# Patient Record
Sex: Male | Born: 1940
Health system: Southern US, Community
[De-identification: ages and names within clinical notes are randomized; demographics above are authoritative.]

## PROBLEM LIST (undated history)

## (undated) DIAGNOSIS — I739 Peripheral vascular disease, unspecified: Secondary | ICD-10-CM

## (undated) DIAGNOSIS — E119 Type 2 diabetes mellitus without complications: Secondary | ICD-10-CM

## (undated) DIAGNOSIS — E559 Vitamin D deficiency, unspecified: Secondary | ICD-10-CM

## (undated) DIAGNOSIS — C61 Malignant neoplasm of prostate: Secondary | ICD-10-CM

## (undated) DIAGNOSIS — Z923 Personal history of irradiation: Secondary | ICD-10-CM

## (undated) DIAGNOSIS — I482 Chronic atrial fibrillation, unspecified: Secondary | ICD-10-CM

## (undated) DIAGNOSIS — Z95810 Presence of automatic (implantable) cardiac defibrillator: Secondary | ICD-10-CM

## (undated) DIAGNOSIS — E785 Hyperlipidemia, unspecified: Secondary | ICD-10-CM

## (undated) DIAGNOSIS — I509 Heart failure, unspecified: Secondary | ICD-10-CM

## (undated) DIAGNOSIS — C801 Malignant (primary) neoplasm, unspecified: Secondary | ICD-10-CM

## (undated) DIAGNOSIS — Z8619 Personal history of other infectious and parasitic diseases: Secondary | ICD-10-CM

## (undated) DIAGNOSIS — I10 Essential (primary) hypertension: Secondary | ICD-10-CM

## (undated) HISTORY — DX: Heart failure, unspecified: I50.9

## (undated) HISTORY — DX: Personal history of other infectious and parasitic diseases: Z86.19

## (undated) HISTORY — PX: PROSTATE BIOPSY: SHX241

## (undated) HISTORY — PX: KNEE SURGERY: SHX244

## (undated) HISTORY — DX: Type 2 diabetes mellitus without complications: E11.9

## (undated) HISTORY — DX: Chronic atrial fibrillation, unspecified: I48.20

## (undated) HISTORY — DX: Personal history of irradiation: Z92.3

## (undated) HISTORY — DX: Essential (primary) hypertension: I10

## (undated) HISTORY — DX: Hyperlipidemia, unspecified: E78.5

---
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
## 2004-10-23 ENCOUNTER — Ambulatory Visit: Payer: Self-pay | Admitting: Cardiology

## 2004-12-01 ENCOUNTER — Ambulatory Visit (HOSPITAL_COMMUNITY): Admission: RE | Admit: 2004-12-01 | Discharge: 2004-12-01 | Payer: Self-pay | Admitting: Family Medicine

## 2005-11-12 ENCOUNTER — Ambulatory Visit: Payer: Self-pay | Admitting: Cardiology

## 2006-11-11 ENCOUNTER — Ambulatory Visit: Payer: Self-pay | Admitting: Cardiology

## 2007-12-09 ENCOUNTER — Ambulatory Visit: Payer: Self-pay | Admitting: Cardiology

## 2009-01-09 ENCOUNTER — Ambulatory Visit: Payer: Self-pay | Admitting: Cardiology

## 2009-01-21 ENCOUNTER — Encounter: Payer: Self-pay | Admitting: Cardiology

## 2009-01-21 LAB — CONVERTED CEMR LAB
Albumin: 4.5 g/dL
Alkaline Phosphatase: 67 units/L
CO2: 27 meq/L
Cholesterol: 239 mg/dL
Creatinine, Ser: 1.09 mg/dL
Glucose, Bld: 119 mg/dL
LDL (calc): 152 mg/dL
LDL Cholesterol: 152 mg/dL
MCV: 96.2 fL
Triglyceride fasting, serum: 110 mg/dL
WBC: 7.9 10*3/uL

## 2009-03-08 ENCOUNTER — Encounter (INDEPENDENT_AMBULATORY_CARE_PROVIDER_SITE_OTHER): Payer: Self-pay | Admitting: *Deleted

## 2009-03-18 DIAGNOSIS — E119 Type 2 diabetes mellitus without complications: Secondary | ICD-10-CM | POA: Insufficient documentation

## 2009-03-18 DIAGNOSIS — I4891 Unspecified atrial fibrillation: Secondary | ICD-10-CM

## 2009-03-18 DIAGNOSIS — E785 Hyperlipidemia, unspecified: Secondary | ICD-10-CM

## 2009-12-10 ENCOUNTER — Encounter: Payer: Self-pay | Admitting: Cardiology

## 2010-01-05 ENCOUNTER — Encounter: Payer: Self-pay | Admitting: Cardiology

## 2010-01-05 DIAGNOSIS — M109 Gout, unspecified: Secondary | ICD-10-CM

## 2010-01-06 ENCOUNTER — Ambulatory Visit: Payer: Self-pay | Admitting: Cardiology

## 2010-01-07 ENCOUNTER — Encounter (INDEPENDENT_AMBULATORY_CARE_PROVIDER_SITE_OTHER): Payer: Self-pay | Admitting: *Deleted

## 2010-01-07 ENCOUNTER — Encounter: Payer: Self-pay | Admitting: Cardiology

## 2010-01-07 LAB — CONVERTED CEMR LAB
ALT: 15 units/L (ref 0–53)
AST: 19 units/L
AST: 19 units/L (ref 0–37)
Albumin: 4.5 g/dL
Albumin: 4.5 g/dL (ref 3.5–5.2)
Alkaline Phosphatase: 68 units/L (ref 39–117)
BUN: 26 mg/dL
Basophils Absolute: 0 10*3/uL (ref 0.0–0.1)
Basophils Relative: 1 % (ref 0–1)
Calcium: 10.4 mg/dL
Cholesterol: 180 mg/dL
Creatinine, Ser: 1.11 mg/dL
Eosinophils Relative: 3 % (ref 0–5)
Glucose, Bld: 121 mg/dL
Glucose, Bld: 121 mg/dL — ABNORMAL HIGH (ref 70–99)
HCT: 46.7 %
HCT: 46.7 % (ref 39.0–52.0)
Hemoglobin: 14.9 g/dL
LDL Cholesterol: 121 mg/dL — ABNORMAL HIGH (ref 0–99)
MCV: 100 fL
Neutro Abs: 4.3 10*3/uL (ref 1.7–7.7)
Neutrophils Relative %: 58 % (ref 43–77)
Platelets: 248 10*3/uL
RDW: 14.7 % (ref 11.5–15.5)
Sodium: 140 meq/L (ref 135–145)
Total Protein: 7.1 g/dL
WBC: 7.4 10*3/uL
WBC: 7.4 10*3/uL (ref 4.0–10.5)

## 2010-01-08 ENCOUNTER — Encounter (INDEPENDENT_AMBULATORY_CARE_PROVIDER_SITE_OTHER): Payer: Self-pay | Admitting: *Deleted

## 2010-01-09 ENCOUNTER — Encounter (INDEPENDENT_AMBULATORY_CARE_PROVIDER_SITE_OTHER): Payer: Self-pay | Admitting: *Deleted

## 2010-10-29 NOTE — Miscellaneous (Signed)
Summary: catopril and metoprolol refill  Clinical Lists Changes  Medications: Added new medication of CAPTOPRIL 100 MG TABS (CAPTOPRIL) Take one tablet by mouth once daily - Signed Added new medication of METOPROLOL TARTRATE 100 MG TABS (METOPROLOL TARTRATE) Take 1/2  tablet by mouth twice a day - Signed Rx of CAPTOPRIL 100 MG TABS (CAPTOPRIL) Take one tablet by mouth once daily;  #90 x 3;  Signed;  Entered by: Teressa Lower RN;  Authorized by: Kathlen Brunswick, MD, [facility];  Method used: Electronically to [facility] [address]*, [address], [place], [place], [place]  [address], Ph: [phone], Fax: [phone] Rx of METOPROLOL TARTRATE 100 MG TABS (METOPROLOL TARTRATE) Take 1/2  tablet by mouth twice a day;  #90 x 3;  Signed;  Entered by: Teressa Lower RN;  Authorized by: Kathlen Brunswick, MD, [facility];  Method used: Electronically to [facility] [address] [place], [place], [place]  [address], Ph: [phone], Fax: [phone]    Prescriptions: METOPROLOL TARTRATE 100 MG TABS (METOPROLOL TARTRATE) Take 1/2  tablet by mouth twice a day  #90 x 3   Entered by:   Teressa Lower RN   Authorized by:   Kathlen Brunswick, MD, [facility]   Signed by:   Teressa Lower RN on 12/10/2009   Method used:   Electronically to        [facility]  [address] (retail)       [address]       [place], [place]  [address]       Ph: [phone]       Fax: [phone]   RxID:   [id number] CAPTOPRIL 100 MG TABS (CAPTOPRIL) Take one tablet by mouth once daily  #90 x 3   Entered by:   Teressa Lower RN   Authorized by:   Kathlen Brunswick, MD, [facility]   Signed by:   Teressa Lower RN on 12/10/2009   Method used:   Electronically to        [facility]  [address] (retail)       [address]       [place], [place]  [address]       Ph: [phone]       Fax: [phone]   RxID:   [id number]

## 2010-10-29 NOTE — Assessment & Plan Note (Signed)
Summary: 1 YR FU PER WALK IN ON 11/18/2009 [name]  Medications Added CHLORTHALIDONE 25 MG TABS (CHLORTHALIDONE) take 1 tablet daily GARLIC OIL 1000 MG CAPS (GARLIC) take 1 cap daily DIPHENHYDRAMINE HCL 25 MG CAPS (DIPHENHYDRAMINE HCL) take 1 tab at bedtime ZINC 50 MG TABS (ZINC) take 1 tab daily WAL-TUSSIN 100 MG/5ML SYRP (GUAIFENESIN) take as directed VITAMIN E 400 UNIT CAPS (VITAMIN E) take 1 cap two times a day ACETAMINOPHEN 500 MG TABS (ACETAMINOPHEN) take as needed SB-NORMAL STOOL FORMULA 350 MG TABS (SANGRE DE GRADO EXTRACT) take 1 tab daily WARFARIN SODIUM 5 MG TABS (WARFARIN SODIUM) take as directed ALLOPURINOL 300 MG TABS (ALLOPURINOL) take 1 tab daily METFORMIN HCL 500 MG TABS (METFORMIN HCL) take 1 tab two times a day DILTIAZEM HCL ER BEADS 180 MG XR24H-CAP (DILTIAZEM HCL ER BEADS) take 1 tab two times a day LOVASTATIN 20 MG TABS (LOVASTATIN) take 1 tab daily      Allergies Added: NKDA  Visit Type:  Follow-up Primary Provider:  Dr. Butch Penny   History of Present Illness: [name] returns to the office as scheduled for continued assessment and treatment of chronic atrial fibrillation, hypertension and hyperlipidemia.  Since his last visit, he has remained perfectly healthy.  He has not required emergency department visits nor hospitalization.  He remains active with no cardiopulmonary symptoms; specifically, he denies chest discomfort, orthopnea, dyspnea on exertion, PND, lightheadedness or syncope.  Warfarin therapy as managed in Dr. Lorenso Courier office.  Current Medications (verified): 1)  Chlorthalidone 25 Mg Tabs (Chlorthalidone) .... Take 1 Tablet Daily 2)  Captopril 100 Mg Tabs (Captopril) .... Take One Tablet By Mouth Once Daily 3)  Metoprolol Tartrate 100 Mg Tabs (Metoprolol Tartrate) .... Take 1/2  Tablet By Mouth Twice A Day 4)  Garlic Oil 1000 Mg Caps (Garlic) .... Take 1 Cap Daily 5)  Diphenhydramine Hcl 25 Mg Caps (Diphenhydramine Hcl) .... Take 1 Tab At  Bedtime 6)  Zinc 50 Mg Tabs (Zinc) .... Take 1 Tab Daily 7)  Wal-Tussin 100 Mg/57ml Syrp (Guaifenesin) .... Take As Directed 8)  Vitamin E 400 Unit Caps (Vitamin E) .... Take 1 Cap Two Times A Day 9)  Acetaminophen 500 Mg Tabs (Acetaminophen) .... Take As Needed 10)  Sb-Normal Stool Formula 350 Mg Tabs ([place] Extract) .... Take 1 Tab Daily 11)  Warfarin Sodium 5 Mg Tabs (Warfarin Sodium) .... Take As Directed 12)  Allopurinol 300 Mg Tabs (Allopurinol) .... Take 1 Tab Daily 13)  Metformin Hcl 500 Mg Tabs (Metformin Hcl) .... Take 1 Tab Two Times A Day 14)  Diltiazem Hcl Er Beads 180 Mg Xr24h-Cap (Diltiazem Hcl Er Beads) .... Take 1 Tab Two Times A Day 15)  Lovastatin 20 Mg Tabs (Lovastatin) .... Take 1 Tab Daily  Allergies (verified): No Known Drug Allergies  Past History:  PMH, FH, and Social History reviewed and updated.  Past Medical History: Chronic atrial fibrillation; DC cardioversion in 1996 on and off antiarrhythmic with no prolonged sinus rhythm Anticoagulation-managed by primary care physician HYPERTENSION HYPERLIPIDEMIA AODM-no insulin Recurrent bronchitis History of herpes zoster Gout      Review of Systems       See history of present illness.  Vital Signs:  Patient profile:   70 year old male Height:      74 inches Weight:      212 pounds BMI:     27.32 Pulse rate:   80 / minute BP sitting:   132 / 84  (right arm)  Vitals Entered By: Dreama Saa, CNA (January 06, 2010 2:49 PM)  Physical Exam  General:    Very pleasant gentleman in no acute distress. Weight-212, 12 pounds more than at his visit last year. Neck: No jjugular venous distention; normal carotid upstrokes without bruits. Lungs:  Clear.   Cardiac:  Normal first and second heart sounds; irregular rhythm.   Abdomen:  Soft and nontender; no masses; no organomegaly.   Extremities:  Increased pigmentation over the shins(chronic stasis changes) with trace edema.     Impression &  Recommendations:  Problem # 1:  ATRIAL FIBRILLATION (ICD-427.31) Control of heart rate remains excellent; anticoagulation has been stable and therapeutic per Dr. Renard Matter.  A CBC and stool for Hemoccult testing will be obtained.  Problem # 2:  HYPERTENSION (ICD-401.9) Blood pressure control is excellent.  A chemistry profile will be obtained to monitor pharmacologic therapy of his hypertension.  Problem # 3:  HYPERLIPIDEMIA-MIXED (ICD-272.4) Lipid profile was good last year; a repeat study will be obtained.  I will reassess this nice gentleman in one year.  Other Orders: Future Orders: T-Comprehensive Metabolic Panel ([id number]) ... 01/07/2010 T-CBC w/Diff ([id number]) ... 01/07/2010 T-Lipid Profile [phone]) ... 01/07/2010  Patient Instructions: 1)  Your physician recommends that you schedule a follow-up appointment in: 1 YEAR 2)  Your physician has asked that you test your stool for blood. It is necessary to test 3 different stool specimens for accuracy. You will be given 3 hemoccult cards for specimen collection. For each stool specimen, place a small portion of stool sample (from 2 different areas of the stool) into the 2 squares on the card. Close card. Repeat with 2 more stool specimens. Bring the cards back to the office for testing. 3)  Your physician recommends that you return for lab work in: LABWORK NEXT WEEK Prescriptions: CHLORTHALIDONE 25 MG TABS (CHLORTHALIDONE) take 1 tablet daily  #90 x 3   Entered by:   Teressa Lower RN   Authorized by:   Kathlen Brunswick, MD, [facility]   Signed by:   Teressa Lower RN on 01/06/2010   Method used:   Electronically to        [facility]  [address] (retail)       [address]       [place], [place]  [address]       Ph: [phone]       Fax: [phone]   RxID:   [id number]

## 2010-10-29 NOTE — Miscellaneous (Signed)
Summary: labs cbcd,cmp,lipid,01/07/2010  Clinical Lists Changes  Observations: Added new observation of CALCIUM: 10.4 mg/dL (44/09/270 53:66) Added new observation of ALBUMIN: 4.5 g/dL (44/11/4740 59:56) Added new observation of PROTEIN, TOT: 7.1 g/dL (38/75/6433 29:51) Added new observation of SGPT (ALT): 15 units/L (01/07/2010 15:50) Added new observation of SGOT (AST): 19 units/L (01/07/2010 15:50) Added new observation of ALK PHOS: 68 units/L (01/07/2010 15:50) Added new observation of CREATININE: 1.11 mg/dL (88/41/6606 30:16) Added new observation of BUN: 26 mg/dL (10/06/3233 57:32) Added new observation of BG RANDOM: 121 mg/dL (20/25/4270 62:37) Added new observation of CO2 PLSM/SER: 25 meq/L (01/07/2010 15:50) Added new observation of CL SERUM: 100 meq/L (01/07/2010 15:50) Added new observation of K SERUM: 4.4 meq/L (01/07/2010 15:50) Added new observation of NA: 140 meq/L (01/07/2010 15:50) Added new observation of LDL: 14 mg/dL (62/83/1517 61:60) Added new observation of HDL: 45 mg/dL (73/71/0626 94:85) Added new observation of TRIGLYC TOT: 72 mg/dL (46/27/0350 09:38) Added new observation of CHOLESTEROL: 180 mg/dL (18/29/9371 69:67) Added new observation of PLATELETK/UL: 248 K/uL (01/07/2010 15:50) Added new observation of MCV: 100.0 fL (01/07/2010 15:50) Added new observation of HCT: 46.7 % (01/07/2010 15:50) Added new observation of HGB: 14.9 g/dL (89/38/1017 51:02) Added new observation of WBC COUNT: 7.4 10*3/microliter (01/07/2010 15:50)

## 2010-10-29 NOTE — Letter (Signed)
Summary: [place] [employer] [facility], agricultural at [facility]  [address], [place] [address]   Phone: [phone]  Fax: [phone]      January 09, 2010 MRN: [id number]   [name] [address] [place], [place]  [address]   Dear Mr. GORELIK,  Your test ordered by Selena Batten has been reviewed by your physician (or physician assistant) and was found to be normal or stable. Your physician (or physician assistant) felt no changes were needed at this time.  ____ Echocardiogram  ____ Cardiac Stress Test  __x__ Lab Work  ____ Peripheral vascular study of arms, legs or neck  ____ CT scan or X-ray  ____ Lung or Breathing test  ____ Other:  No change in medical treatment at this time,  per Dr. Dietrich Pates.  Enclosed is a copy of your labwork for your records.  Thank you, Shyah Cadmus Allyne Gee RN    Homer Bing, MD, [facility] Arena.C.Gaylord Shih, MD, F.A.C.C Lewayne Bunting, MD, F.A.C.C Nona Dell, MD, F.A.C.C Charlton Haws, MD, Lenise Arena.C.C

## 2010-12-03 DIAGNOSIS — Z7901 Long term (current) use of anticoagulants: Secondary | ICD-10-CM

## 2010-12-03 DIAGNOSIS — I10 Essential (primary) hypertension: Secondary | ICD-10-CM | POA: Insufficient documentation

## 2010-12-29 ENCOUNTER — Encounter: Payer: Self-pay | Admitting: Cardiology

## 2010-12-29 ENCOUNTER — Encounter: Payer: Self-pay | Admitting: *Deleted

## 2010-12-29 ENCOUNTER — Ambulatory Visit (INDEPENDENT_AMBULATORY_CARE_PROVIDER_SITE_OTHER): Payer: Self-pay | Admitting: Cardiology

## 2010-12-29 VITALS — BP 135/84 | HR 77 | Ht 74.0 in | Wt 209.0 lb

## 2010-12-29 DIAGNOSIS — I4891 Unspecified atrial fibrillation: Secondary | ICD-10-CM

## 2010-12-29 DIAGNOSIS — E119 Type 2 diabetes mellitus without complications: Secondary | ICD-10-CM

## 2010-12-29 DIAGNOSIS — Z7901 Long term (current) use of anticoagulants: Secondary | ICD-10-CM

## 2010-12-29 DIAGNOSIS — I10 Essential (primary) hypertension: Secondary | ICD-10-CM

## 2010-12-29 DIAGNOSIS — E785 Hyperlipidemia, unspecified: Secondary | ICD-10-CM

## 2010-12-29 MED ORDER — LOVASTATIN 40 MG PO TABS: 40.0000 mg | ORAL_TABLET | Freq: Every day | ORAL | Status: DC

## 2010-12-29 NOTE — Assessment & Plan Note (Signed)
Blood pressure control is excellent with current medications, which will be continued. 

## 2010-12-29 NOTE — Patient Instructions (Signed)
**Note De-Identified Kalie Cabral Obfuscation** Your physician recommends that you schedule a follow-up appointment in: 1 year Your physician recommends that you return for lab work in: today

## 2010-12-29 NOTE — Assessment & Plan Note (Signed)
Control of heart rate remains excellent.  There has been no evidence for thromboembolic phenomena with the patient fully anticoagulated and generally in the therapeutic range.  Current management of this problem will continue unchanged.

## 2010-12-29 NOTE — Progress Notes (Signed)
HPI : [name] returns the office as scheduled for continued assessment and treatment of chronic atrial fibrillation.  Since his last visit, he has done extremely well.  He has not required urgent medical care nor developed any new medical problems.  Exercise tolerance remains excellent with no chest discomfort, palpitations or dyspnea.  Anticoagulation dosing is managed by his primary care physician.  He notes that his family is long-lived and that he is the youngest of multiple siblings, nearly all of whom are still living.  He has never undergone colonoscopy.  Records obtained from Dr. Renard Matter and reviewed.  His most recent laboratory studies in October showed a normal complete metabolic profile, uric acid level of 5.8 and a hemoglobin A1c level of 6.9.  Lipids were good with total cholesterol of 184, triglycerides 92, HDL of 47 and LDL of 119.  INR was therapeutic at 2.4.  No CBC has been performed within the past year.  Patient was requested to return cards for Hemoccult testing to this office, but did not.  Current Outpatient Prescriptions on File Prior to Visit  Medication Sig Dispense Refill  . acetaminophen (TYLENOL) 500 MG tablet Take 500 mg by mouth every 6 (six) hours as needed.        Marland Kitchen allopurinol (ZYLOPRIM) 300 MG tablet Take 300 mg by mouth daily.        . captopril (CAPOTEN) 100 MG tablet Take 100 mg by mouth daily.        . chlorthalidone (HYGROTON) 25 MG tablet Take 25 mg by mouth daily.        Marland Kitchen diltiazem (TIAZAC) 180 MG 24 hr capsule Take 180 mg by mouth 2 (two) times daily.        . diphenhydrAMINE (SOMINEX) 25 MG tablet Take 25 mg by mouth at bedtime as needed.        . Garlic Oil 1000 MG CAPS Take 1 capsule by mouth daily.        Marland Kitchen guaifenesin (WAL-TUSSIN) 100 MG/5ML syrup Take 200 mg by mouth 3 (three) times daily as needed.        . lovastatin (MEVACOR) 20 MG tablet Take 20 mg by mouth daily.        . metFORMIN (GLUCOPHAGE) 500 MG tablet Take 500 mg by mouth 2 (two) times  daily.        . metoprolol (LOPRESSOR) 100 MG tablet Take 50 mg by mouth 2 (two) times daily.        Lezlie Octave Extract (SB-NORMAL STOOL FORMULA) 350 MG TABS Take 1 tablet by mouth daily.        . vitamin E 400 UNIT capsule Take 400 Units by mouth 2 (two) times daily.        Marland Kitchen warfarin (COUMADIN) 5 MG tablet Take 5 mg by mouth daily. Or as directed by anticoagulation clinic       . zinc gluconate 50 MG tablet Take 50 mg by mouth daily.           Allergies  Allergen Reactions  . Fluoride Preparations       Past medical history, social history, and family history reviewed and updated.  ROS: See history of present illness  PHYSICAL EXAM: BP 135/84  Pulse 77  Ht 6\' 2"  (1.88 m)  Wt 209 lb (94.802 kg)  BMI 26.83 kg/m2  SpO2 96%  General-Well developed; no acute distress Body habitus-proportionate weight and height Neck-No JVD; no carotid bruits Lungs-clear lung fields; resonant to percussion  Cardiovascular-normal PMI; normal S1 and S2; irregular rhythm Abdomen-normal bowel sounds; soft and non-tender without masses or organomegaly Musculoskeletal-No deformities, no cyanosis or clubbing Neurologic-Normal cranial nerves; symmetric strength and tone Skin-Warm, no significant lesions Extremities-distal pulses intact; no edema  ASSESSMENT AND PLAN:

## 2010-12-29 NOTE — Assessment & Plan Note (Signed)
Stool for Hemoccult testing once again be requested and a CBC obtained to exclude the possibility of occult GI blood loss.

## 2010-12-29 NOTE — Assessment & Plan Note (Signed)
In the absence of known vascular disease, but with the presence of diabetes, lipid management could be somewhat better.  Patient's dose of lovastatin will be increased to 40 mg q.d. With a repeat lipid profile in one month.

## 2011-02-10 NOTE — Letter (Signed)
January 09, 2009    Angus G. Renard Matter, MD  [address]  [place], [place] [address]   RE:  [name], [name]  MRN:  [id number]  /  DOB:  1941-04-21   Dear Thalia Party,   [name] returns to the office for continued assessment and treatment  of chronic atrial fibrillation requiring anticoagulation, hypertension,  and hyperlipidemia.  He has also developed some mild diabetes treated  with an oral agent.  He has enjoyed generally good health over the past  year with no significant medical illnesses.  He has not experienced any  recent episodes of gout.   Medications are unchanged from his last visit except for substitution of  lovastatin 20 mg daily as his lipid-lowering agent.  Recent lipid  profile obtained in your office was somewhat suboptimal with an LDL of  145.   On exam, very pleasant trim gentleman in no acute distress.  The weight  is 200 pounds, 2 pounds more than the last year.  Blood pressure 130/80,  heart rate 78 and irregular, respirations 13 and unlabored.  Neck:  No  jugular venous distention; normal carotid upstrokes without bruits.  Lungs:  Clear.  Cardiac:  Normal first and second heart sounds;  irregular rhythm.  Abdomen:  Soft and nontender; no masses; no  organomegaly.  Extremities:  Slight increased pigmentation over the  shins with trace edema.   RHYTHM STRIP:  Atrial fibrillation with controlled ventricular response.   IMPRESSION:  [name] is doing beautifully with current medical  therapy.  Hypertension is extremely well controlled.  He has such a bad  opinion of the statins, that it may prove difficult to ever control his  lipids adequately.  For now, he agrees to increase lovastatin to 40 mg  daily with  plans for repeat lipid profile in your office.  We will obtain a CBC and  stool for Hemoccult testing in light of his continuing anticoagulation.  Warfarin dosing is done in your office.  He does not recall undergoing  colonoscopy.  If this has not  been done within the past 10 years, he may  wish to consider a screening study.  Otherwise, I will plan to see this  nice gentleman again in 1 year.    Sincerely,      Gerrit Friends. Dietrich Pates, MD, [facility]  Electronically Signed    RMR/MedQ  DD: 01/09/2009  DT: 01/10/2009  Job #: [id number]

## 2011-02-10 NOTE — Letter (Signed)
December 09, 2007    Angus G. Renard Matter, MD  [address]  [place], [place] [address]   RE:  [name], [name]  MRN:  [id number]  /  DOB:  [birth year]   Dear Thalia Party,   [name] returns to the office for continued assessment and treatment  of chronic atrial fibrillation and hypertension.  Since his last visit,  he has done superbly.  He was told of an elevated fasting blood glucose,  which prompted him to lose 40 pounds.  His blood pressure control has  been good.  He has remained active.  He reports no health issues  whatsoever.  He has stopped taking Vytorin  due to concerns about  possible adverse effects.  Otherwise, his medications are unchanged from  last year.   On exam, pleasant gentleman, looking much thinner than in the past and  in no acute distress.  The weight is 198, 43 pounds less than last year.  Blood pressure 135/90, heart rate 75 and irregular, respirations 18.  HEENT:  Anicteric sclerae; normal oral mucosa.  NECK:  No jugular venous distention; normal carotid upstrokes without  bruits.  SKIN:  Multiple minor abrasions over the hands with erythema of the  skin; crusted lesion over the right forehead; plethora of the face.  LUNGS:  Clear.  CARDIAC:  Normal first and second heart sounds.  ABDOMEN:  Soft and nontender; no organomegaly.  EXTREMITIES:  Normal distal pulses; no edema.   IMPRESSION:  [name] is doing well overall.  It may be that he no  longer requires medication for diabetes in light of his weight loss.  You might wish to stop metformin and to follow blood glucose values and  hemoglobin A1c.  If he no longer has significant diabetes, he probably  does  not require a statin medication.  I discussed with him some non-  pharmacologic methods for improving his lipid profile.  He has never had  colonoscopy and is 70 years of age - you may wish to consider this test  for him.  We will attempt to obtain stool cards for hemoccult testing -  he did not  return these last year.  I will see him again in 1 year.    Sincerely,      Gerrit Friends. Dietrich Pates, MD, [facility]  Electronically Signed    RMR/MedQ  DD: 12/09/2007  DT: 12/10/2007  Job #: [id number]

## 2011-02-13 NOTE — Letter (Signed)
November 11, 2006    Catalina Pizza, M.D.  [address]  [place],  [place] [address]   RE:  [name], [name]  MRN:  [id number]  /  DOB:  02-27-1941   Dear [name]:   [name] returns to the office for continued assessment and treatment  of chronic atrial fibrillation requiring anticoagulation.  He has done  superbly over the past year, dropping 30 pounds, as you know, and  substantially changing his diet.  He has had no significant medical  problems.  The precipitant for his weight loss was borderline diabetes.  A recent hemoglobin A1c level and a urine micro albumin are normal.   PHYSICAL EXAMINATION:  Pleasant, proportionate gentleman in no acute  distress.  The weight is 214, 31 pounds less than 1 year ago.  Blood pressure 125/80, heart rate 72 and irregular, respirations 16.  NECK:  No jugular venous distension, normal carotid upstrokes without  bruits.  LUNGS:  Clear.  CARDIAC:  Normal 1st and 2nd heart sounds, irregular rhythm.  ABDOMEN:  Soft and nontender, no masses, no bruits, no organomegaly.  EXTREMITIES:  Trace pretibial edema.   IMPRESSION:  [name] is doing beautifully.  We will renew his usual  medications and check stool for hemoccult testing in light of his  chronic anticoagulation.  A recent chemistry profile and CBC from your  office are normal.  I will plan to see this nice gentleman again in 1  year.  Warfarin management is currently being performed from your  office.    Sincerely,      Gerrit Friends. Dietrich Pates, MD, [facility]  Electronically Signed    RMR/MedQ  DD: 11/11/2006  DT: 11/11/2006  Job #: [id number]

## 2011-02-14 LAB — LIPID PANEL
Cholesterol: 196 mg/dL (ref 0–200)
HDL: 46 mg/dL (ref >39)
LDL Cholesterol: 132 mg/dL — ABNORMAL HIGH (ref 0–99)
Total CHOL/HDL Ratio: 4.3 Ratio
Triglycerides: 92 mg/dL (ref <150)
VLDL: 18 mg/dL (ref 0–40)

## 2011-02-17 ENCOUNTER — Telehealth: Payer: Self-pay | Admitting: *Deleted

## 2011-02-17 NOTE — Telephone Encounter (Signed)
Message copied by Teressa Lower on Tue Feb 17, 2011  8:43 AM ------      Message from: [place] Bing      Created: Sun Feb 15, 2011 10:10 PM       Lipid control is suboptimal.      DC lovastatin.      Atorvastatin 40 mg q.d.      Fasting lipid profile in one month.

## 2011-02-18 ENCOUNTER — Telehealth: Payer: Self-pay | Admitting: *Deleted

## 2011-02-18 NOTE — Telephone Encounter (Signed)
Message copied by Teressa Lower on Wed Feb 18, 2011  4:48 PM ------      Message from: [place] Bing      Created: Sun Feb 15, 2011 10:10 PM       Lipid control is suboptimal.      DC lovastatin.      Atorvastatin 40 mg q.d.      Fasting lipid profile in one month.

## 2011-02-19 ENCOUNTER — Other Ambulatory Visit: Payer: Self-pay | Admitting: Cardiology

## 2011-02-19 ENCOUNTER — Encounter: Payer: Self-pay | Admitting: *Deleted

## 2011-02-19 DIAGNOSIS — E785 Hyperlipidemia, unspecified: Secondary | ICD-10-CM

## 2011-02-19 MED ORDER — ATORVASTATIN CALCIUM 40 MG PO TABS: 40.0000 mg | ORAL_TABLET | Freq: Every day | ORAL | Status: DC

## 2011-03-18 ENCOUNTER — Other Ambulatory Visit: Payer: Self-pay | Admitting: Cardiology

## 2011-03-20 ENCOUNTER — Other Ambulatory Visit: Payer: Self-pay | Admitting: *Deleted

## 2011-03-20 MED ORDER — LOVASTATIN 40 MG PO TABS: 40.0000 mg | ORAL_TABLET | Freq: Every day | ORAL | Status: DC

## 2011-03-20 MED ORDER — CAPTOPRIL 100 MG PO TABS: 50.0000 mg | ORAL_TABLET | Freq: Two times a day (BID) | ORAL | Status: DC

## 2011-03-20 NOTE — Progress Notes (Signed)
Pt refused to take atorvastatin, states that it causes him severe leg pain. Pt states he will take lovastatin 40mg  daily, instead

## 2011-04-05 ENCOUNTER — Other Ambulatory Visit: Payer: Self-pay | Admitting: Cardiology

## 2011-12-21 ENCOUNTER — Other Ambulatory Visit: Payer: Self-pay | Admitting: Cardiology

## 2011-12-30 ENCOUNTER — Encounter: Payer: Self-pay | Admitting: Cardiology

## 2011-12-30 ENCOUNTER — Ambulatory Visit (INDEPENDENT_AMBULATORY_CARE_PROVIDER_SITE_OTHER): Payer: Medicare Other | Admitting: Cardiology

## 2011-12-30 VITALS — BP 128/81 | HR 89 | Ht 74.5 in | Wt 208.0 lb

## 2011-12-30 DIAGNOSIS — Z7901 Long term (current) use of anticoagulants: Secondary | ICD-10-CM

## 2011-12-30 DIAGNOSIS — E785 Hyperlipidemia, unspecified: Secondary | ICD-10-CM

## 2011-12-30 DIAGNOSIS — M109 Gout, unspecified: Secondary | ICD-10-CM

## 2011-12-30 DIAGNOSIS — I4891 Unspecified atrial fibrillation: Secondary | ICD-10-CM

## 2011-12-30 DIAGNOSIS — I10 Essential (primary) hypertension: Secondary | ICD-10-CM

## 2011-12-30 DIAGNOSIS — E119 Type 2 diabetes mellitus without complications: Secondary | ICD-10-CM

## 2011-12-30 NOTE — Assessment & Plan Note (Addendum)
Very stable anticoagulation on Coumadin for many years.  Patient has never undergone colonoscopy and is not interested in the screening study; stool Hemoccult testing and a CBC are pending

## 2011-12-30 NOTE — Assessment & Plan Note (Signed)
Patient remains asymptomatic with respect to long standing atrial fibrillation.  Current strategy of rate control and anticoagulation will be continued.

## 2011-12-30 NOTE — Assessment & Plan Note (Signed)
Good control of diabetes managed by Dr. Renard Matter

## 2011-12-30 NOTE — Progress Notes (Signed)
Patient ID: [name], male   DOB: 01-Jun-1941, 71 y.o.   MRN: [id number]  HPI: Scheduled return visit for continuing assessment and treatment of atrial fibrillation requiring chronic anticoagulation.  INR testing and dose adjustment or performed by patient's PCP, Dr. Renard Gross.  [name] reports no medical problems over the past 12 months.  He has not been hospitalized nor required urgent medical care.  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  Prior to Admission medications   Medication Sig Start Date End Date Taking? Authorizing Provider  acetaminophen (TYLENOL) 500 MG tablet Take 500 mg by mouth every 6 (six) hours as needed.     Yes Historical Provider, MD  allopurinol (ZYLOPRIM) 300 MG tablet Take 300 mg by mouth daily.     Yes Historical Provider, MD  ascorbic acid (VITAMIN C) 250 MG CHEW Chew 500 mg by mouth daily.   Yes Historical Provider, MD  captopril (CAPOTEN) 100 MG tablet Take 0.5 tablets (50 mg total) by mouth 2 (two) times daily. 03/20/11  Yes Daniel Brunswick, MD  chlorthalidone (HYGROTON) 25 MG tablet TAKE ONE TABLET BY MOUTH EVERY DAY 04/05/11  Yes [name], NP  diltiazem [facility]) 180 MG 24 hr capsule Take 180 mg by mouth 2 (two) times daily.     Yes Historical Provider, MD  diphenhydrAMINE (SOMINEX) 25 MG tablet Take 25 mg by mouth at bedtime as needed.     Yes Historical Provider, MD  guaifenesin (WAL-TUSSIN) 100 MG/5ML syrup Take 200 mg by mouth 3 (three) times daily as needed.     Yes Historical Provider, MD  lovastatin (MEVACOR) 40 MG tablet Take 1 tablet (40 mg total) by mouth at bedtime. 03/20/11 03/19/12 Yes Daniel Brunswick, MD  MANGANESE PO Take 1 capsule by mouth 3 (three) times daily.     Yes Historical Provider, MD  metFORMIN (GLUCOPHAGE) 500 MG tablet Take 500 mg by mouth 2 (two) times daily.     Yes Historical Provider, MD  metoprolol (LOPRESSOR) 100 MG tablet TAKE ONE-HALF TABLET BY MOUTH  TWICE DAILY 12/21/11  Yes Daniel Brunswick, MD  [facility] Extract (SB-NORMAL STOOL FORMULA) 350 MG TABS Take 1 tablet by mouth daily.     Yes Historical Provider, MD  vitamin E 400 UNIT capsule Take 400 Units by mouth 2 (two) times daily.     Yes Historical Provider, MD  warfarin (COUMADIN) 5 MG tablet Take 5 mg by mouth daily. Or as directed by anticoagulation clinic    Yes Historical Provider, MD  zinc gluconate 50 MG tablet Take 50 mg by mouth daily.     Yes Historical Provider, MD   Allergies  Allergen Reactions  . Fluoride Preparations      Past medical history, social history, and family history reviewed and updated.  ROS: Denies chest pain, dyspnea, dyspnea on exertion, orthopnea, PND, palpitations, lightheadedness or syncope.  All other systems reviewed and are negative.  PHYSICAL EXAM: BP 128/81  Pulse 89  Ht 6' 2.5" (1.892 m)  Wt 94.348 kg (208 lb)  BMI 26.35 kg/m2; weight is stable; apical heart rate-92 General-Well developed; no acute distress Body habitus-slightly overweight Neck-No JVD; no carotid bruits Lungs-clear lung fields; resonant to percussion Cardiovascular-normal PMI; normal S1 and S2; minimal apical systolic murmur; irregular rhythm Abdomen-normal bowel sounds; soft and non-tender without masses or organomegaly Musculoskeletal-No deformities, no cyanosis or clubbing Neurologic-Normal cranial nerves; symmetric strength and tone Skin-Warm,  no significant lesions Extremities-distal pulses intact; 1+ pretibial edema  ASSESSMENT AND PLAN:  Daniel Bing, MD 12/30/2011 7:17 PM

## 2011-12-30 NOTE — Assessment & Plan Note (Signed)
Blood pressure control has been excellent with current medication, which will be continued. 

## 2011-12-30 NOTE — Assessment & Plan Note (Signed)
Adequate control of hyperlipidemia in the setting of diabetes but no known vascular disease.

## 2011-12-30 NOTE — Patient Instructions (Signed)
Your physician recommends that you schedule a follow-up appointment in: 1 year  Your physician recommends that you return for lab work in: today  Stools x 3 and return to office asap

## 2012-01-01 ENCOUNTER — Encounter: Payer: Self-pay | Admitting: *Deleted

## 2012-01-02 LAB — CBC
MCV: 100.2 fL — ABNORMAL HIGH (ref 78.0–100.0)
Platelets: 251 10*3/uL (ref 150–400)

## 2012-01-04 ENCOUNTER — Encounter: Payer: Self-pay | Admitting: *Deleted

## 2012-01-15 ENCOUNTER — Encounter: Payer: Self-pay | Admitting: *Deleted

## 2012-01-26 ENCOUNTER — Encounter: Payer: Self-pay | Admitting: *Deleted

## 2012-03-21 ENCOUNTER — Other Ambulatory Visit: Payer: Self-pay | Admitting: Cardiology

## 2012-03-22 ENCOUNTER — Other Ambulatory Visit: Payer: Self-pay | Admitting: Cardiology

## 2012-03-22 MED ORDER — CAPTOPRIL 100 MG PO TABS: 50.0000 mg | ORAL_TABLET | Freq: Two times a day (BID) | ORAL | Status: DC

## 2012-03-22 NOTE — Telephone Encounter (Signed)
[facility] IS CALLING STATING WE WROTE RX FOR NAME BRAND ONLY AND PT HAS ALWAYS TAKEN GENERIC. JUST CALLING TO VERIFY

## 2012-04-06 ENCOUNTER — Other Ambulatory Visit: Payer: Self-pay | Admitting: Cardiology

## 2012-06-03 ENCOUNTER — Other Ambulatory Visit: Payer: Self-pay | Admitting: Cardiology

## 2012-06-04 ENCOUNTER — Other Ambulatory Visit: Payer: Self-pay | Admitting: Adult Health

## 2012-06-14 ENCOUNTER — Other Ambulatory Visit: Payer: Self-pay | Admitting: Cardiology

## 2012-08-19 ENCOUNTER — Ambulatory Visit (INDEPENDENT_AMBULATORY_CARE_PROVIDER_SITE_OTHER): Payer: Medicare Other | Admitting: Urology

## 2012-08-19 DIAGNOSIS — N402 Nodular prostate without lower urinary tract symptoms: Secondary | ICD-10-CM

## 2012-08-19 DIAGNOSIS — R972 Elevated prostate specific antigen [PSA]: Secondary | ICD-10-CM

## 2012-09-28 DIAGNOSIS — C61 Malignant neoplasm of prostate: Secondary | ICD-10-CM

## 2012-09-28 HISTORY — DX: Malignant neoplasm of prostate: C61

## 2012-11-01 ENCOUNTER — Other Ambulatory Visit: Payer: Self-pay | Admitting: Urology

## 2012-11-01 DIAGNOSIS — C61 Malignant neoplasm of prostate: Secondary | ICD-10-CM

## 2012-11-02 ENCOUNTER — Ambulatory Visit: Payer: Medicare Other

## 2012-11-02 ENCOUNTER — Ambulatory Visit: Payer: Medicare Other | Admitting: Radiation Oncology

## 2012-11-02 ENCOUNTER — Other Ambulatory Visit: Payer: Self-pay | Admitting: Urology

## 2012-11-02 DIAGNOSIS — C61 Malignant neoplasm of prostate: Secondary | ICD-10-CM

## 2012-11-04 ENCOUNTER — Ambulatory Visit (HOSPITAL_COMMUNITY)
Admission: RE | Admit: 2012-11-04 | Discharge: 2012-11-04 | Disposition: A | Discharge status: Home / Self Care | Payer: Medicare Other | Source: Ambulatory Visit | Attending: Urology | Admitting: Urology

## 2012-11-04 DIAGNOSIS — N400 Enlarged prostate without lower urinary tract symptoms: Secondary | ICD-10-CM | POA: Insufficient documentation

## 2012-11-04 DIAGNOSIS — C61 Malignant neoplasm of prostate: Secondary | ICD-10-CM

## 2012-11-04 DIAGNOSIS — K5730 Diverticulosis of large intestine without perforation or abscess without bleeding: Secondary | ICD-10-CM | POA: Insufficient documentation

## 2012-11-04 MED ORDER — IOHEXOL 300 MG/ML  SOLN
100.0000 mL | Freq: Once | INTRAMUSCULAR | Status: AC | PRN
  Administered 2012-11-04: 100 mL via INTRAVENOUS

## 2012-11-07 ENCOUNTER — Encounter (HOSPITAL_COMMUNITY)
Admission: RE | Admit: 2012-11-07 | Discharge: 2012-11-07 | Disposition: A | Discharge status: Home / Self Care | Payer: Medicare Other | Source: Ambulatory Visit | Attending: Urology | Admitting: Urology

## 2012-11-07 ENCOUNTER — Encounter (HOSPITAL_COMMUNITY): Payer: Self-pay

## 2012-11-07 DIAGNOSIS — C61 Malignant neoplasm of prostate: Secondary | ICD-10-CM | POA: Insufficient documentation

## 2012-11-07 HISTORY — DX: Malignant (primary) neoplasm, unspecified: C80.1

## 2012-11-07 MED ORDER — TECHNETIUM TC 99M MEDRONATE IV KIT
25.0000 | PACK | Freq: Once | INTRAVENOUS | Status: AC | PRN
  Administered 2012-11-07: 25 via INTRAVENOUS

## 2012-11-21 ENCOUNTER — Ambulatory Visit
Admission: RE | Admit: 2012-11-21 | Discharge: 2012-11-21 | Disposition: A | Discharge status: Home / Self Care | Payer: Medicare Other | Source: Ambulatory Visit | Attending: Radiation Oncology | Admitting: Radiation Oncology

## 2012-11-21 ENCOUNTER — Encounter: Payer: Self-pay | Admitting: Radiation Oncology

## 2012-11-21 VITALS — BP 160/80 | HR 88 | Temp 96.4°F | Resp 18 | Ht 74.0 in | Wt 214.1 lb

## 2012-11-21 DIAGNOSIS — I10 Essential (primary) hypertension: Secondary | ICD-10-CM | POA: Insufficient documentation

## 2012-11-21 DIAGNOSIS — C61 Malignant neoplasm of prostate: Secondary | ICD-10-CM | POA: Insufficient documentation

## 2012-11-21 DIAGNOSIS — Z79899 Other long term (current) drug therapy: Secondary | ICD-10-CM | POA: Insufficient documentation

## 2012-11-21 DIAGNOSIS — E785 Hyperlipidemia, unspecified: Secondary | ICD-10-CM | POA: Insufficient documentation

## 2012-11-21 DIAGNOSIS — I2510 Atherosclerotic heart disease of native coronary artery without angina pectoris: Secondary | ICD-10-CM | POA: Insufficient documentation

## 2012-11-21 DIAGNOSIS — E119 Type 2 diabetes mellitus without complications: Secondary | ICD-10-CM | POA: Insufficient documentation

## 2012-11-21 HISTORY — DX: Malignant neoplasm of prostate: C61

## 2012-11-21 NOTE — Progress Notes (Signed)
Radiation Oncology         ([phone]) [phone] ________________________________  Initial outpatient Consultation  Name: [name] MRN: [id number]  Date: 11/21/2012  DOB: 1941-01-12  WU:[id number],NWGNF G, MD  Anner Crete, MD   REFERRING PHYSICIAN: Anner Crete, MD  DIAGNOSIS: The encounter diagnosis was Prostate ca. T3a, No, Mo  HISTORY OF PRESENT ILLNESS::[name] is a 72 y.o. male who is seen out of the courtesy of Dr. Bjorn Pippin for an opinion concerning the patient's recently diagnosed locally advanced prostate cancer. The patient was recently found to have an elevated PSA of 11.4 through Dr. Lorenso Courier office.  He was referred to Dr. Bjorn Pippin for evaluation. He was noted to have some induration to the prostate but no nodularity. The patient proceeded to undergo transrectal ultrasound and biopsy with 4/12 biopsies showing malignancy. Most significant disease within the left  apex region with Gleason score 6 involving 90% of one core. In addition within the left apex Gleason score of 7 was noted involving 90% of one core. There was in addition in this area perineural invasion and extra prostatic extension noted.  The patient was felt to be high risk in light of the above issues and he proceeded to undergo staging workup with a CT scan of the pelvis and bone scan performed. The prostate gland was noted to be moderately enlarged at 4.8 x 4.8 x 4.4 cm. There was no evidence of metastatic spread within the pelvis area. The patient's bone scan showed no osseous metastasis. In light of the above findings as well as the patient's medical history he was not felt to be a good candidate for radical prostatectomy.   PREVIOUS RADIATION THERAPY: No  PAST MEDICAL HISTORY:  has a past medical history of Chronic atrial fibrillation; Hypertension; Hyperlipidemia; Diabetes mellitus, type II; Chronic bronchitis; History of herpes zoster virus; Gout; Coronary artery disease; Cancer; and Prostate cancer.      PAST SURGICAL HISTORY: Past Surgical History  Procedure Laterality Date  . Knee surgery      left knee  . Prostate biopsy      FAMILY HISTORY: family history includes Heart attack in his father and Kidney failure in his mother. sister with breast cancer  SOCIAL HISTORY:  reports that he has never smoked. He has never used smokeless tobacco. He reports that he does not drink alcohol or use illicit drugs.  ALLERGIES: Fluoride preparations  MEDICATIONS:  Current Outpatient Prescriptions  Medication Sig Dispense Refill  . allopurinol (ZYLOPRIM) 300 MG tablet Take 300 mg by mouth daily.        Marland Kitchen ascorbic acid (VITAMIN C) 250 MG CHEW Chew 500 mg by mouth daily.      . captopril (CAPOTEN) 100 MG tablet Take 0.5 tablets (50 mg total) by mouth 2 (two) times daily.  30 tablet  10  . chlorthalidone (HYGROTON) 25 MG tablet TAKE ONE TABLET BY MOUTH EVERY DAY  90 tablet  3  . diltiazem (TIAZAC) 180 MG 24 hr capsule Take 180 mg by mouth 2 (two) times daily.        . diphenhydrAMINE (SOMINEX) 25 MG tablet Take 25 mg by mouth at bedtime as needed.        Marland Kitchen guaifenesin (WAL-TUSSIN) 100 MG/5ML syrup Take 200 mg by mouth 3 (three) times daily as needed.        . lovastatin (MEVACOR) 40 MG tablet TAKE ONE TABLET BY MOUTH EVERY DAY AT BEDTIME  30 tablet  3  .  MANGANESE PO Take 1 capsule by mouth 3 (three) times daily.        . metFORMIN (GLUCOPHAGE) 500 MG tablet Take 500 mg by mouth 2 (two) times daily.        . metoprolol (LOPRESSOR) 100 MG tablet TAKE ONE-HALF TABLET BY MOUTH TWICE DAILY  90 tablet  6  . [place] Extract (SB-NORMAL STOOL FORMULA) 350 MG TABS Take 1 tablet by mouth daily.        . vitamin E 400 UNIT capsule Take 400 Units by mouth 2 (two) times daily.        Marland Kitchen warfarin (COUMADIN) 5 MG tablet Take 5 mg by mouth daily. Or as directed by anticoagulation clinic       . zinc gluconate 50 MG tablet Take 50 mg by mouth daily.        Marland Kitchen acetaminophen (TYLENOL) 500 MG tablet Take 500 mg  by mouth every 6 (six) hours as needed.         No current facility-administered medications for this encounter.    REVIEW OF SYSTEMS:  A 15 point review of systems is documented in the electronic medical record. This was obtained by the nursing staff. However, I reviewed this with the patient to discuss relevant findings and make appropriate changes. The patient completed the international prostate symptom score with total score of 9 representing mild to moderate symptomatology. Most significant score with an nocturia at 4 times per evening. He denies any new bony pain.   PHYSICAL EXAM:  height is 6\' 2"  (1.88 m) and weight is 214 lb 1.6 oz (97.115 kg). His oral temperature is 96.4 F (35.8 C). His blood pressure is 160/80 and his pulse is 88. His respiration is 18 and oxygen saturation is 100%.   BP 160/80  Pulse 88  Temp(Src) 96.4 F (35.8 C) (Oral)  Resp 18  Ht 6\' 2"  (1.88 m)  Wt 214 lb 1.6 oz (97.115 kg)  BMI 27.48 kg/m2  SpO2 100%  General Appearance:    Alert, cooperative, no distress, appears stated age,  accompanied by his wife   Head:    Normocephalic, without obvious abnormality, atraumatic  Eyes:    PERRL, conjunctiva/corneas clear, EOM's intact,        Nose:   Nares normal, septum midline, mucosa normal, no drainage    or sinus tenderness  Throat:   Lips, mucosa, and tongue normal; teeth and gums normal  Neck:   Supple, symmetrical, trachea midline, no adenopathy;       thyroid:  No enlargement/tenderness/nodules; no carotid   bruit or JVD  Back:     Symmetric, no curvature, ROM normal, no CVA tenderness  Lungs:     Clear to auscultation bilaterally, respirations unlabored  Chest wall:    No tenderness or deformity  Heart:    irregular rhythm consistent with atrial fibrillation   Abdomen:     Soft, non-tender, bowel sounds active all four quadrants,    no masses, no organomegaly  Genitalia:    Normal male without lesion, discharge or tenderness, uncircumcised   Rectal:     Normal tone, the prostate is mildly enlarged and firm throughout. In the apical area there appears to be a small palpable nodule possibly 5 mm, along the left side.    Extremities:   Extremities normal, atraumatic, no cyanosis or edema  Pulses:   2+ and symmetric all extremities  Skin:   Skin color, texture, turgor normal, no rashes or lesions  Lymph nodes:   Cervical, supraclavicular, and axillary nodes normal  Neurologic:   Normal strength, sensation and reflexes      throughout    LABORATORY DATA:  Lab Results  Component Value Date   WBC 8.1 01/01/2012   HGB 15.4 01/01/2012   HCT 48.0 01/01/2012   MCV 100.2* 01/01/2012   PLT 251 01/01/2012   Lab Results  Component Value Date   NA 140 01/07/2010   K 4.4 01/07/2010   CL 100 01/07/2010   CO2 25 01/07/2010   Lab Results  Component Value Date   ALT 15 01/07/2010   AST 19 01/07/2010   ALKPHOS 68 01/07/2010   BILITOT 0.6 01/07/2010     RADIOGRAPHY: Ct Pelvis W Contrast  11/04/2012  *RADIOLOGY REPORT*  Clinical Data:  Recent diagnosis of prostate cancer.  CT PELVIS WITH CONTRAST  Technique:  Multidetector CT imaging of the pelvis was performed using the standard protocol following the bolus administration of intravenous contrast.  Contrast: OMNIPAQUE IOHEXOL 300 MG/ML.  Oral contrast was also administered.  Comparison:   None.  Findings:  Moderate prostate gland enlargement, the gland approximating 4.8 x 4.8 x 4.4 cm.  Prostatic tissue extends adjacent to the pelvic side wall, but direct extension is not clearly demonstrated.  No evidence of significant lymphadenopathy in the pelvis.  Extensive scarring involving the visualized lower pole of the left kidney.  Visualized lower pole of the right kidney unremarkable. Inspissated stool-like material in the distal and terminal ileum; remainder of the visualized small bowel unremarkable.  Distal descending and sigmoid colon diverticulosis without evidence of acute diverticulitis.  Visualized cecum and  ascending colon unremarkable.  Normal appearing appendix.  No ascites.  Aorto- iliofemoral atherosclerosis without aneurysm.  No evidence of inguinal hernia.  Bone window images demonstrate severe degenerative changes involving the lower lumbar spine and both sacroiliac joints, degenerative changes in both hips, but no evidence of osseous metastatic disease.  A sclerotic focus in the right acetabular roof is felt to more likely to represent a bone island rather than an isolated metastasis.  IMPRESSION:  1.  Moderate prostate gland enlargement. 2.  No evidence of metastatic disease in the pelvis. 3.  Distal descending and sigmoid colon diverticulosis without evidence of acute diverticulitis. 4.  Inspissated stool-like material in the distal and terminal ileum consistent with stasis. 5.  Sclerotic focus in the right acetabular roof is felt to more likely represent a bone island rather than an isolated sclerotic metastasis. 6.  Scarring involving the visualized lower pole of the left kidney.   Original Report Authenticated By: Hulan Saas, M.D.    Nm Bone Scan Whole Body  11/07/2012  *RADIOLOGY REPORT*  Clinical Data: Prostate cancer.  NUCLEAR MEDICINE WHOLE BODY BONE SCINTIGRAPHY  Technique:  Whole body anterior and posterior images were obtained approximately 3 hours after intravenous injection of radiopharmaceutical.  Radiopharmaceutical: CURIE TC-MDP TECHNETIUM TC 37M MEDRONATE IV KIT the  Comparison: None.  Findings: No suspicious areas of bony uptake to suggest osseous metastatic disease.  Mild increased activity noted in the region of the sternoclavicular joints and left AC joint as well as knees and feet, presumably degenerative.  Soft tissue activity unremarkable.  IMPRESSION: No evidence for bony metastatic disease.   Original Report Authenticated By: Charlett Nose, M.D.       IMPRESSION: High-risk Gleason's 7 adenocarcinoma of the prostate.  In light of the above findings I would not  recommend watchful waiting for Mr. Pellow. We discussed curative  options. As above he is not felt to be a good surgical candidate. I discussed radiation therapy options including IMRT and the 3D with seed implant. Given the patient's history of atrial fibrillation,  diabetes mellitus and coronary artery disease the patient does not feel comfortable with considering limited seed implant as part of his overall management. Patient would like to proceed with IMRT for definitive management of his prostate cancer. In addition I would recommend androgen deprivation as part of his overall management.  PLAN: The patient will be set up to see Dr. [name] in the near future for starting androgen deprivation along with placement of gold fiducial markers in preparation for image guided,  intensity modulated radiation therapy.  I spent 60 minutes minutes face to face with the patient and more than 50% of that time was spent in counseling and/or coordination of care.   ------------------------------------------------  -----------------------------------  Billie Lade, PhD, MD

## 2012-11-21 NOTE — Progress Notes (Signed)
Patient presents to the clinic today accompanied by his wife for consultation with Dr. Roselind Messier to discuss the role of radiation therapy in the treatment of prostate ca. Patient is alert and oriented to person, place, and time. No distress noted. Steady gait noted. Pleasant affect noted. Patient denies pain at this time. Patient reports that on average he gets up four times during the night to void. Patient reports that on average he goes to the bathroom every 30 minutes to an hour during the day. Patient reports urgency. Patient reports occasional incontinence. Patient denies diarrhea. Patient denies hematuria. Patient denies burning with urination. Patient denies pain associated with bowel movement. Patient denies blood in stool. Patient denies difficulty sleeping. Patient reports a normal adequate appetite. Patient denies nausea, vomiting, headache, or dizziness. Reported all findings to Dr. Roselind Messier.  AX: Fluoride No hx of radiation therapy  No indication of pacemaker  39 cc prostate Gleason 7 (3+4) perineural incasion, 6(3+3)  Left lateral apical core, 6 left medial mid and right medial apical core PSA 11.47 IPSS at [facility] 10/27/2012 6

## 2012-11-21 NOTE — Progress Notes (Signed)
See progress note under physician encounter. 

## 2012-11-21 NOTE — Addendum Note (Signed)
Encounter addended by: Lorane Cousar Mintz Tyrece Vanterpool, RN on: 11/21/2012  7:25 PM<BR>     Documentation filed: Charges VN

## 2012-11-21 NOTE — Progress Notes (Signed)
Complete PATIENT MEASURE OF DISTRESS worksheet with a score of 0 submitted to social work.  

## 2012-11-25 ENCOUNTER — Ambulatory Visit (INDEPENDENT_AMBULATORY_CARE_PROVIDER_SITE_OTHER): Payer: Medicare Other | Admitting: Urology

## 2012-11-25 DIAGNOSIS — C61 Malignant neoplasm of prostate: Secondary | ICD-10-CM

## 2012-11-29 ENCOUNTER — Telehealth: Payer: Self-pay | Admitting: *Deleted

## 2012-11-29 NOTE — Telephone Encounter (Signed)
CALLED PATIENT TO INFORM OF ANDROGEN ABLATION AT DR. Belva Crome OFFICE IN [place] ON 12-30-12 AT 2:15 PM, AND HIS GOLD SEED PLACEMENT ON 12-08-12 AT 9:15 AM AT DR. Belva Crome OFFICE IN [place] AND HIS SIM ON 12-12-12 AT 10:00 AM AT DR. KINARD'S OFFICE, LVM FOR A RETURN CALL

## 2012-12-12 ENCOUNTER — Telehealth: Payer: Self-pay | Admitting: Radiation Oncology

## 2012-12-12 ENCOUNTER — Ambulatory Visit
Admission: RE | Admit: 2012-12-12 | Discharge: 2012-12-12 | Disposition: A | Discharge status: Home / Self Care | Payer: Medicare Other | Source: Ambulatory Visit | Attending: Radiation Oncology | Admitting: Radiation Oncology

## 2012-12-12 DIAGNOSIS — C61 Malignant neoplasm of prostate: Secondary | ICD-10-CM | POA: Insufficient documentation

## 2012-12-12 DIAGNOSIS — R5383 Other fatigue: Secondary | ICD-10-CM | POA: Insufficient documentation

## 2012-12-12 DIAGNOSIS — R351 Nocturia: Secondary | ICD-10-CM | POA: Insufficient documentation

## 2012-12-12 DIAGNOSIS — R300 Dysuria: Secondary | ICD-10-CM | POA: Insufficient documentation

## 2012-12-12 DIAGNOSIS — Z510 Encounter for antineoplastic radiation therapy: Secondary | ICD-10-CM | POA: Insufficient documentation

## 2012-12-12 DIAGNOSIS — R3915 Urgency of urination: Secondary | ICD-10-CM | POA: Insufficient documentation

## 2012-12-12 DIAGNOSIS — R5381 Other malaise: Secondary | ICD-10-CM | POA: Insufficient documentation

## 2012-12-12 NOTE — Telephone Encounter (Signed)
Met w patient to discuss RO billing. Pt had no financial concerns today.  Dx: Prostate ca - Primary 185   Attending Rad:  JK   Rad Tx: IMRT

## 2012-12-12 NOTE — Progress Notes (Signed)
  Radiation Oncology         [phone] ________________________________  Name: [name] MRN: [id number]  Date: 12/12/2012  DOB: 09/13/1941  SIMULATION AND TREATMENT PLANNING NOTE  DIAGNOSIS:  Prostate cancer T3a, No, Mo  NARRATIVE:  The patient was brought to the CT Simulation planning suite.  Identity was confirmed.  All relevant records and images related to the planned course of therapy were reviewed.  The patient freely provided informed written consent to proceed with treatment after reviewing the details related to the planned course of therapy. The consent form was witnessed and verified by the simulation staff.  Then, the patient was set-up in a stable reproducible  supine position for radiation therapy.  CT images were obtained.  Surface markings were placed.  The CT images were loaded into the planning software.  Then the target and avoidance structures were contoured.  Treatment planning then occurred.  The radiation prescription was entered and confirmed.  Then, I designed and supervised the construction of a total of 1 medically necessary complex treatment devices.  I have requested : Intensity Modulated Radiotherapy (IMRT) is medically necessary for this case for the following reason:  Rectal sparing..  I have ordered:rad calc.  PLAN:  The patient will receive 78 Gy in 40 fractions.  ________________________________  -----------------------------------  Billie Lade, PhD, MD

## 2012-12-21 ENCOUNTER — Ambulatory Visit
Admission: RE | Admit: 2012-12-21 | Discharge: 2012-12-21 | Disposition: A | Discharge status: Home / Self Care | Payer: Medicare Other | Source: Ambulatory Visit | Attending: Radiation Oncology | Admitting: Radiation Oncology

## 2012-12-21 DIAGNOSIS — C61 Malignant neoplasm of prostate: Secondary | ICD-10-CM

## 2012-12-21 NOTE — Progress Notes (Signed)
Post sim ed completed w/pt. Gave pt "Radiation and You" booklet w/all pertinent information marked and discussed, re: fatigue, bowel issues/care, urinary issues/care, skin care, nutrition, pain. Gave pt RN card for [employer]. All questions answered.

## 2012-12-21 NOTE — Progress Notes (Signed)
  Radiation Oncology         [phone]) [phone] ________________________________  Name: [name] MRN: [id number]  Date: 12/21/2012  DOB: 07/17/1941  Simulation Verification Note  Status: outpatient  NARRATIVE: The patient was brought to the treatment unit and placed in the planned treatment position. The clinical setup was verified. Then port films were obtained and uploaded to the radiation oncology medical record software.  The treatment beams were carefully compared against the planned radiation fields. The position location and shape of the radiation fields was reviewed. They targeted volume of tissue appears to be appropriately covered by the radiation beams. Organs at risk appear to be excluded as planned.  Based on my personal review, I approved the simulation verification. The patient's treatment will proceed as planned.  -----------------------------------  Billie Lade, PhD, MD

## 2012-12-22 ENCOUNTER — Ambulatory Visit
Admission: RE | Admit: 2012-12-22 | Discharge: 2012-12-22 | Disposition: A | Discharge status: Home / Self Care | Payer: Medicare Other | Source: Ambulatory Visit | Attending: Radiation Oncology | Admitting: Radiation Oncology

## 2012-12-23 ENCOUNTER — Ambulatory Visit
Admission: RE | Admit: 2012-12-23 | Discharge: 2012-12-23 | Disposition: A | Discharge status: Home / Self Care | Payer: Medicare Other | Source: Ambulatory Visit | Attending: Radiation Oncology | Admitting: Radiation Oncology

## 2012-12-26 ENCOUNTER — Ambulatory Visit: Admission: RE | Admit: 2012-12-26 | Payer: Medicare Other | Source: Ambulatory Visit

## 2012-12-27 ENCOUNTER — Ambulatory Visit
Admission: RE | Admit: 2012-12-27 | Discharge: 2012-12-27 | Disposition: A | Discharge status: Home / Self Care | Payer: Medicare Other | Source: Ambulatory Visit | Attending: Radiation Oncology | Admitting: Radiation Oncology

## 2012-12-27 VITALS — BP 146/64 | HR 71 | Temp 98.9°F | Ht 74.0 in | Wt 213.5 lb

## 2012-12-27 DIAGNOSIS — C61 Malignant neoplasm of prostate: Secondary | ICD-10-CM

## 2012-12-27 NOTE — Progress Notes (Signed)
[facility]    Radiation Oncology [address] [place]     Maryln Gottron, M.D. [place], [place] [address]               Billie Lade, M.D., Ph.D. Phone: [phone]      Molli Hazard A. Kathrynn Running, M.D. Fax: [phone]      Radene Gunning, M.D., Ph.D.         Lurline Hare, M.D.         Grayland Jack, M.D Weekly Treatment Management Note  Name: [name]     MRN: [id number]        CSN: [id number] Date: 12/27/2012      DOB: [birth year]  CC: [name], MD         [place]    Status: Outpatient  Diagnosis: The encounter diagnosis was Prostate ca.  Current Dose: 7.8 Gy  Current Fraction: 4  Planned Dose: 78 Gy  Narrative: [name] was seen today for weekly treatment management. The chart was checked and CBCT  were reviewed. He is tolerating his treatments well this time without any appreciable side effects.  he does have nocturia 4-5 times which is his baseline. He also has urinary urgency which may be slightly increased over his baseline. He did have some dysuria for couple days after his bladder catheterization.  Fluoride preparations  Current Outpatient Prescriptions  Medication Sig Dispense Refill  . acetaminophen (TYLENOL) 500 MG tablet Take 500 mg by mouth every 6 (six) hours as needed.        Marland Kitchen allopurinol (ZYLOPRIM) 300 MG tablet Take 300 mg by mouth daily.        Marland Kitchen ascorbic acid (VITAMIN C) 250 MG CHEW Chew 500 mg by mouth daily.      . captopril (CAPOTEN) 100 MG tablet Take 0.5 tablets (50 mg total) by mouth 2 (two) times daily.  30 tablet  10  . chlorthalidone (HYGROTON) 25 MG tablet TAKE ONE TABLET BY MOUTH EVERY DAY  90 tablet  3  . diltiazem (TIAZAC) 180 MG 24 hr capsule Take 180 mg by mouth 2 (two) times daily.        Marland Kitchen guaifenesin (WAL-TUSSIN) 100 MG/5ML syrup Take 200 mg by mouth 3 (three) times daily as needed.        . lovastatin (MEVACOR) 40 MG tablet TAKE ONE TABLET BY MOUTH EVERY DAY AT BEDTIME  30 tablet  3  . MANGANESE PO Take  1 capsule by mouth 3 (three) times daily.        . metFORMIN (GLUCOPHAGE) 500 MG tablet Take 500 mg by mouth 2 (two) times daily.        . metoprolol (LOPRESSOR) 100 MG tablet TAKE ONE-HALF TABLET BY MOUTH TWICE DAILY  90 tablet  6  . vitamin E 400 UNIT capsule Take 1,000 Units by mouth daily.       Marland Kitchen warfarin (COUMADIN) 5 MG tablet Take 5 mg by mouth daily. Or as directed by anticoagulation clinic       . zinc gluconate 50 MG tablet Take 50 mg by mouth daily. Takes 1/2 tablet daily      . diltiazem (CARDIZEM CD) 180 MG 24 hr capsule       . diphenhydrAMINE (SOMINEX) 25 MG tablet Take 25 mg by mouth at bedtime as needed.        [employer] Extract (SB-NORMAL STOOL FORMULA) 350 MG TABS Take 1 tablet by mouth daily.  No current facility-administered medications for this encounter.   Labs:  Lab Results  Component Value Date   WBC 8.1 01/01/2012   HGB 15.4 01/01/2012   HCT 48.0 01/01/2012   MCV 100.2* 01/01/2012   PLT 251 01/01/2012   Lab Results  Component Value Date   CREATININE 1.11 01/07/2010   BUN 26* 01/07/2010   NA 140 01/07/2010   K 4.4 01/07/2010   CL 100 01/07/2010   CO2 25 01/07/2010   Lab Results  Component Value Date   ALT 15 01/07/2010   AST 19 01/07/2010   BILITOT 0.6 01/07/2010    Physical Examination:  height is 6\' 2"  (1.88 m) and weight is 213 lb 8 oz (96.843 kg). His temperature is 98.9 F (37.2 C). His blood pressure is 146/64 and his pulse is 71.    Wt Readings from Last 3 Encounters:  12/27/12 213 lb 8 oz (96.843 kg)  11/21/12 214 lb 1.6 oz (97.115 kg)  12/30/11 208 lb (94.348 kg)     Lungs - Normal respiratory effort, chest expands symmetrically. Lungs are clear to auscultation, no crackles or wheezes.  Heart has regular rhythm and rate  Abdomen is soft and non tender with normal bowel sounds  Assessment:  Patient tolerating treatments well  Plan: Continue treatment per original radiation prescription

## 2012-12-27 NOTE — Progress Notes (Signed)
Mr. Egolf here for weekly under treat visit.  He has had 4/40 fractions to his prostate.  He denies pain.  He does have fatigue.  He reports that he has urinary urgency.  He gets up 4/5 times a night to urinate. He denies hematuria or diarrhea.

## 2012-12-28 ENCOUNTER — Ambulatory Visit
Admission: RE | Admit: 2012-12-28 | Discharge: 2012-12-28 | Disposition: A | Discharge status: Home / Self Care | Payer: Medicare Other | Source: Ambulatory Visit | Attending: Radiation Oncology | Admitting: Radiation Oncology

## 2012-12-29 ENCOUNTER — Ambulatory Visit
Admission: RE | Admit: 2012-12-29 | Discharge: 2012-12-29 | Disposition: A | Discharge status: Home / Self Care | Payer: Medicare Other | Source: Ambulatory Visit | Attending: Radiation Oncology | Admitting: Radiation Oncology

## 2012-12-29 ENCOUNTER — Ambulatory Visit (INDEPENDENT_AMBULATORY_CARE_PROVIDER_SITE_OTHER): Payer: Medicare Other | Admitting: Cardiology

## 2012-12-29 ENCOUNTER — Encounter: Payer: Self-pay | Admitting: Cardiology

## 2012-12-29 VITALS — BP 144/80 | HR 72 | Ht 74.5 in | Wt 213.0 lb

## 2012-12-29 DIAGNOSIS — I10 Essential (primary) hypertension: Secondary | ICD-10-CM

## 2012-12-29 DIAGNOSIS — C61 Malignant neoplasm of prostate: Secondary | ICD-10-CM

## 2012-12-29 DIAGNOSIS — I4891 Unspecified atrial fibrillation: Secondary | ICD-10-CM

## 2012-12-29 DIAGNOSIS — Z7901 Long term (current) use of anticoagulants: Secondary | ICD-10-CM

## 2012-12-29 MED ORDER — DILTIAZEM HCL 60 MG PO TABS: 60.0000 mg | ORAL_TABLET | Freq: Three times a day (TID) | ORAL | Status: DC

## 2012-12-29 NOTE — Assessment & Plan Note (Signed)
As usual, systolic blood pressure is minimally elevated, but patient reports systolics closer to 120 when assessed elsewhere. Control of hypertension is basically good, and current medications will be continued.

## 2012-12-29 NOTE — Progress Notes (Deleted)
Name: [name]    DOB: 12-31-1940  Age: 72 y.o.  MR#: [id number]       PCP:  [name], MD      Insurance: Payor: [employer] MEDICARE  Plan: AARP MEDICARE COMPLETE  Product Type: *No Product type*    CC:   No chief complaint on file.  MEDICATION LIST STOOLS NOT RETURNED  VS Filed Vitals:   12/29/12 1315  BP: 144/80  Pulse: 72  Height: 6' 2.5" (1.892 m)  Weight: 213 lb (96.616 kg)    Weights Current Weight  12/29/12 213 lb (96.616 kg)  12/27/12 213 lb 8 oz (96.843 kg)  11/21/12 214 lb 1.6 oz (97.115 kg)    Blood Pressure  BP Readings from Last 3 Encounters:  12/29/12 144/80  12/27/12 146/64  11/21/12 160/80     Admit date:  (Not on file) Last encounter with RMR:  Visit date not found   Allergy Fluoride preparations  Current Outpatient Prescriptions  Medication Sig Dispense Refill  . acetaminophen (TYLENOL) 500 MG tablet Take 500 mg by mouth every 6 (six) hours as needed.        Marland Kitchen allopurinol (ZYLOPRIM) 300 MG tablet Take 300 mg by mouth daily.        Marland Kitchen ascorbic acid (VITAMIN C) 250 MG CHEW Chew 500 mg by mouth daily.      . captopril (CAPOTEN) 100 MG tablet Take 0.5 tablets (50 mg total) by mouth 2 (two) times daily.  30 tablet  10  . chlorthalidone (HYGROTON) 25 MG tablet TAKE ONE TABLET BY MOUTH EVERY DAY  90 tablet  3  . diltiazem (TIAZAC) 180 MG 24 hr capsule Take 180 mg by mouth 2 (two) times daily.        . diphenhydrAMINE (SOMINEX) 25 MG tablet Take 25 mg by mouth at bedtime as needed.        Marland Kitchen guaifenesin (WAL-TUSSIN) 100 MG/5ML syrup Take 200 mg by mouth 3 (three) times daily as needed.        . lovastatin (MEVACOR) 40 MG tablet TAKE ONE TABLET BY MOUTH EVERY DAY AT BEDTIME  30 tablet  3  . metFORMIN (GLUCOPHAGE) 500 MG tablet Take 500 mg by mouth 2 (two) times daily.        . metoprolol (LOPRESSOR) 100 MG tablet TAKE ONE-HALF TABLET BY MOUTH TWICE DAILY  90 tablet  6  . NON FORMULARY Take 3 tablets by mouth daily. MANGA-CAL      . vitamin E  400 UNIT capsule Take 1,000 Units by mouth daily.       Marland Kitchen warfarin (COUMADIN) 5 MG tablet Take 5 mg by mouth daily. Or as directed by anticoagulation clinic       . zinc gluconate 50 MG tablet Take 50 mg by mouth daily. Takes 1/2 tablet daily       No current facility-administered medications for this visit.    Discontinued Meds:    Medications Discontinued During This Encounter  Medication Reason  . diltiazem (CARDIZEM CD) 180 MG 24 hr capsule Error  . [place] Extract (SB-NORMAL STOOL FORMULA) 350 MG TABS Error  . MANGANESE PO Error    Patient Active Problem List  Diagnosis  . DIABETES MELLITUS  . Hyperlipidemia  . GOUT  . Atrial fibrillation  . Hypertension  . Chronic anticoagulation  . Prostate ca    LABS    Component Value Date/Time   NA 140 01/07/2010 1848   NA 140 01/07/2010  NA 141 01/21/2009   K 4.4 01/07/2010 1848   K 4.4 01/07/2010   K 4.4 01/21/2009   CL 100 01/07/2010 1848   CL 100 01/07/2010   CL 101 01/21/2009   CO2 25 01/07/2010 1848   CO2 25 01/07/2010   CO2 27 01/21/2009   GLUCOSE 121* 01/07/2010 1848   GLUCOSE 121 01/07/2010   GLUCOSE 119 01/21/2009   BUN 26* 01/07/2010 1848   BUN 26 01/07/2010   BUN 30 01/21/2009   CREATININE 1.11 01/07/2010 1848   CREATININE 1.11 01/07/2010   CREATININE 1.09 01/21/2009   CALCIUM 10.4 01/07/2010 1848   CALCIUM 10.4 01/07/2010   CALCIUM 9.6 01/21/2009   CMP     Component Value Date/Time   NA 140 01/07/2010 1848   K 4.4 01/07/2010 1848   CL 100 01/07/2010 1848   CO2 25 01/07/2010 1848   GLUCOSE 121* 01/07/2010 1848   BUN 26* 01/07/2010 1848   CREATININE 1.11 01/07/2010 1848   CALCIUM 10.4 01/07/2010 1848   PROT 7.1 01/07/2010 1848   ALBUMIN 4.5 01/07/2010 1848   AST 19 01/07/2010 1848   ALT 15 01/07/2010 1848   ALKPHOS 68 01/07/2010 1848   BILITOT 0.6 01/07/2010 1848       Component Value Date/Time   WBC 8.1 01/01/2012 1555   WBC 7.4 01/07/2010 1848   WBC 7.4 01/07/2010   HGB 15.4 01/01/2012 1555   HGB 14.9 01/07/2010 1848    HGB 14.9 01/07/2010   HCT 48.0 01/01/2012 1555   HCT 46.7 01/07/2010 1848   HCT 46.7 01/07/2010   MCV 100.2* 01/01/2012 1555   MCV 100.0 01/07/2010 1848   MCV 100.0 01/07/2010    Lipid Panel     Component Value Date/Time   CHOL 196 02/13/2011 1055   TRIG 92 02/13/2011 1055   HDL 46 02/13/2011 1055   CHOLHDL 4.3 02/13/2011 1055   VLDL 18 02/13/2011 1055   LDLCALC 132* 02/13/2011 1055   LDLCALC 152 01/21/2009    ABG No results found for this basename: phart, pco2, pco2art, po2, po2art, hco3, tco2, acidbasedef, o2sat     No results found for this basename: TSH   BNP (last 3 results) No results found for this basename: PROBNP,  in the last 8760 hours Cardiac Panel (last 3 results) No results found for this basename: CKTOTAL, CKMB, TROPONINI, RELINDX,  in the last 72 hours  Iron/TIBC/Ferritin No results found for this basename: iron, tibc, ferritin     EKG Orders placed in visit on 12/29/12  . EKG 12-LEAD     Prior Assessment and Plan Problem List as of 12/29/2012     ICD-9-CM     Cardiology Problems   Hyperlipidemia   Last Assessment & Plan   12/30/2011 Office Visit Written 12/30/2011  7:24 PM by Kathlen Brunswick, MD     Adequate control of hyperlipidemia in the setting of diabetes but no known vascular disease.    Atrial fibrillation   Last Assessment & Plan   12/30/2011 Office Visit Written 12/30/2011  7:21 PM by Kathlen Brunswick, MD     Patient remains asymptomatic with respect to long standing atrial fibrillation.  Current strategy of rate control and anticoagulation will be continued.    Hypertension   Last Assessment & Plan   12/30/2011 Office Visit Written 12/30/2011  7:24 PM by Kathlen Brunswick, MD     Blood pressure control has been excellent with current medication, which will be continued.  Other   DIABETES MELLITUS   Last Assessment & Plan   12/30/2011 Office Visit Written 12/30/2011  7:23 PM by Kathlen Brunswick, MD     Good control of diabetes managed by Dr.  Renard Matter    GOUT   Chronic anticoagulation   Last Assessment & Plan   12/30/2011 Office Visit Edited 12/30/2011  7:24 PM by Kathlen Brunswick, MD     Very stable anticoagulation on Coumadin for many years.  Patient has never undergone colonoscopy and is not interested in the screening study; stool Hemoccult testing and a CBC are pending     Prostate ca       Imaging: No results found.

## 2012-12-29 NOTE — Assessment & Plan Note (Addendum)
Long-standing atrial fibrillation with a failed cardioversion many years ago. Patient has been asymptomatic with respect to arrhythmia and has tolerated anticoagulation without manifest or occult bleeding. Strategy of rate control and anticoagulation will be continued.  Patient's costs for long-acting diltiazem has increased dramatically. We will change him to 60 mg tablets taken 3 times per day.

## 2012-12-29 NOTE — Progress Notes (Signed)
Patient ID: [name], male   DOB: 01/24/1941, 72 y.o.   MRN: [id number]  HPI: Schedule return visit for this gregarious and very nice gentleman with long-standing atrial fibrillation. He continues to do well from a cardiac standpoint with no symptoms and no history of thromboembolic phenomena. Anticoagulation has been stable and therapeutic. He was recently diagnosed with carcinoma of the prostate after a substantial increase in his PSA level was noted and is undergoing combined hormonal and radiation therapy. As always, his mood is ebullient and his outlook positive.  Current Outpatient Prescriptions  Medication Sig Dispense Refill  . acetaminophen (TYLENOL) 500 MG tablet Take 500 mg by mouth every 6 (six) hours as needed.        Marland Kitchen allopurinol (ZYLOPRIM) 300 MG tablet Take 300 mg by mouth daily.        Marland Kitchen ascorbic acid (VITAMIN C) 250 MG CHEW Chew 500 mg by mouth daily.      . captopril (CAPOTEN) 100 MG tablet Take 0.5 tablets (50 mg total) by mouth 2 (two) times daily.  30 tablet  10  . chlorthalidone (HYGROTON) 25 MG tablet TAKE ONE TABLET BY MOUTH EVERY DAY  90 tablet  3  . diphenhydrAMINE (SOMINEX) 25 MG tablet Take 25 mg by mouth at bedtime as needed.        Marland Kitchen guaifenesin (WAL-TUSSIN) 100 MG/5ML syrup Take 200 mg by mouth 3 (three) times daily as needed.        . lovastatin (MEVACOR) 40 MG tablet TAKE ONE TABLET BY MOUTH EVERY DAY AT BEDTIME  30 tablet  3  . metFORMIN (GLUCOPHAGE) 500 MG tablet Take 500 mg by mouth 2 (two) times daily.        . metoprolol (LOPRESSOR) 100 MG tablet TAKE ONE-HALF TABLET BY MOUTH TWICE DAILY  90 tablet  6  . NON FORMULARY Take 3 tablets by mouth daily. MANGA-CAL      . vitamin E 400 UNIT capsule Take 1,000 Units by mouth daily.       Marland Kitchen warfarin (COUMADIN) 5 MG tablet Take 5 mg by mouth daily. Or as directed by anticoagulation clinic       . zinc gluconate 50 MG tablet Take 50 mg by mouth daily. Takes 1/2 tablet daily      . diltiazem (CARDIZEM) 60 MG  tablet Take 1 tablet (60 mg total) by mouth 3 (three) times daily.  90 tablet  6   No current facility-administered medications for this visit.   Allergies  Allergen Reactions  . Fluoride Preparations      Past medical history, social history, and family history reviewed and updated.  ROS: Denies chest pain, palpitations, lightheadedness or syncope. No GI symptoms related to initial radiotherapy. No significant bleeding with biopsy or with high volume subcutaneous hormonal injections. All other systems reviewed and are negative.  PHYSICAL EXAM: BP 144/80  Pulse 72  Ht 6' 2.5" (1.892 m)  Wt 96.616 kg (213 lb)  BMI 26.99 kg/m2;  Body mass index is 26.99 kg/(m^2). General-Well developed; no acute distress Body habitus-mildly overweight Neck-No JVD; no carotid bruits Lungs-clear lung fields; resonant to percussion Cardiovascular-normal PMI; normal S1 and S2; irregular rhythm Abdomen-normal bowel sounds; soft and non-tender without masses or organomegaly Musculoskeletal-No deformities, no cyanosis or clubbing Neurologic-Normal cranial nerves; symmetric strength and tone Skin-Warm, no significant lesions Extremities-distal pulses intact; no edema  EKG: Atrial fibrillation with controlled ventricular response; delayed R-wave progression; nonspecific T wave abnormality; no previous tracing for comparison.  Molly Maduro  Dietrich Pates, MD 12/29/2012  1:33 PM  ASSESSMENT AND PLAN

## 2012-12-29 NOTE — Patient Instructions (Addendum)
Your physician recommends that you schedule a follow-up appointment in: 1 year  Your physician has recommended you make the following change in your medication:  1 - CHANGE Diltiazem to 60 mg three times a day  Stools and return to office

## 2012-12-29 NOTE — Assessment & Plan Note (Signed)
Patient has done well on warfarin. We will continue to monitor with periodic CBCs and Hemoccult testing, which is pending.

## 2012-12-30 ENCOUNTER — Ambulatory Visit (INDEPENDENT_AMBULATORY_CARE_PROVIDER_SITE_OTHER): Payer: Medicare Other | Admitting: Urology

## 2012-12-30 ENCOUNTER — Ambulatory Visit
Admission: RE | Admit: 2012-12-30 | Discharge: 2012-12-30 | Disposition: A | Discharge status: Home / Self Care | Payer: Medicare Other | Source: Ambulatory Visit | Attending: Radiation Oncology | Admitting: Radiation Oncology

## 2012-12-30 DIAGNOSIS — C61 Malignant neoplasm of prostate: Secondary | ICD-10-CM

## 2012-12-30 DIAGNOSIS — Z79899 Other long term (current) drug therapy: Secondary | ICD-10-CM

## 2013-01-02 ENCOUNTER — Ambulatory Visit
Admission: RE | Admit: 2013-01-02 | Discharge: 2013-01-02 | Disposition: A | Discharge status: Home / Self Care | Payer: Medicare Other | Source: Ambulatory Visit | Attending: Radiation Oncology | Admitting: Radiation Oncology

## 2013-01-02 ENCOUNTER — Encounter: Payer: Self-pay | Admitting: Radiation Oncology

## 2013-01-02 VITALS — BP 142/82 | HR 88 | Resp 16 | Wt 208.7 lb

## 2013-01-02 DIAGNOSIS — C61 Malignant neoplasm of prostate: Secondary | ICD-10-CM

## 2013-01-02 NOTE — Progress Notes (Signed)
[facility]    Radiation Oncology [address] [place]     Maryln Gottron, M.D. [place], [place] [address]               Billie Lade, M.D., Ph.D. Phone: [phone]      Molli Hazard A. Kathrynn Running, M.D. Fax: [phone]      Radene Gunning, M.D., Ph.D.         Lurline Hare, M.D.         Grayland Jack, M.D Weekly Treatment Management Note  Name: [name]     MRN: [id number]        CSN: [id number] Date: 01/02/2013      DOB: Feb 15, 1941  CC: [name], MD         [place]    Status: Outpatient  Diagnosis: The encounter diagnosis was Prostate cancer.  Current Dose: 15.6 Gy  Current Fraction: 8  Planned Dose: 78 Gy  Narrative: [name] was seen today for weekly treatment management. The chart was checked and KV/KV  were reviewed.  He continues to tolerate the treatments well without any side effects. He actually feels less urinary urgency during the day and at night.  Fluoride preparations  Current Outpatient Prescriptions  Medication Sig Dispense Refill  . acetaminophen (TYLENOL) 500 MG tablet Take 500 mg by mouth every 6 (six) hours as needed.        Marland Kitchen allopurinol (ZYLOPRIM) 300 MG tablet Take 300 mg by mouth daily.        Marland Kitchen ascorbic acid (VITAMIN C) 250 MG CHEW Chew 500 mg by mouth daily.      . captopril (CAPOTEN) 100 MG tablet Take 0.5 tablets (50 mg total) by mouth 2 (two) times daily.  30 tablet  10  . chlorthalidone (HYGROTON) 25 MG tablet TAKE ONE TABLET BY MOUTH EVERY DAY  90 tablet  3  . diltiazem (CARDIZEM) 60 MG tablet Take 1 tablet (60 mg total) by mouth 3 (three) times daily.  90 tablet  6  . diphenhydrAMINE (SOMINEX) 25 MG tablet Take 25 mg by mouth at bedtime as needed.        Marland Kitchen guaifenesin (WAL-TUSSIN) 100 MG/5ML syrup Take 200 mg by mouth 3 (three) times daily as needed.        . lovastatin (MEVACOR) 40 MG tablet TAKE ONE TABLET BY MOUTH EVERY DAY AT BEDTIME  30 tablet  3  . metFORMIN (GLUCOPHAGE) 500 MG tablet Take 500 mg by mouth  2 (two) times daily.        . metoprolol (LOPRESSOR) 100 MG tablet TAKE ONE-HALF TABLET BY MOUTH TWICE DAILY  90 tablet  6  . NON FORMULARY Take 3 tablets by mouth daily. MANGA-CAL      . vitamin E 400 UNIT capsule Take 1,000 Units by mouth daily.       Marland Kitchen warfarin (COUMADIN) 5 MG tablet Take 5 mg by mouth daily. Or as directed by anticoagulation clinic       . zinc gluconate 50 MG tablet Take 50 mg by mouth daily. Takes 1/2 tablet daily       No current facility-administered medications for this encounter.   Labs:  Lab Results  Component Value Date   WBC 8.1 01/01/2012   HGB 15.4 01/01/2012   HCT 48.0 01/01/2012   MCV 100.2* 01/01/2012   PLT 251 01/01/2012   Lab Results  Component Value Date   CREATININE 1.11 01/07/2010   BUN 26* 01/07/2010  NA 140 01/07/2010   K 4.4 01/07/2010   CL 100 01/07/2010   CO2 25 01/07/2010   Lab Results  Component Value Date   ALT 15 01/07/2010   AST 19 01/07/2010   BILITOT 0.6 01/07/2010    Physical Examination:  weight is 208 lb 11.2 oz (94.666 kg). His blood pressure is 142/82 and his pulse is 88. His respiration is 16.    Wt Readings from Last 3 Encounters:  01/02/13 208 lb 11.2 oz (94.666 kg)  12/29/12 213 lb (96.616 kg)  12/27/12 213 lb 8 oz (96.843 kg)     Lungs - Normal respiratory effort, chest expands symmetrically. Lungs are clear to auscultation, no crackles or wheezes.  Heart has an irregular rhythm consistent with atrial fibrillation Abdomen is soft and non tender with normal bowel sounds  Assessment:  Patient tolerating treatments well  Plan: Continue treatment per original radiation prescription

## 2013-01-02 NOTE — Progress Notes (Signed)
Patient presents to the clinic today unaccompanied for PUT with Dr. Roselind Messier. Patient is alert and oriented to person, place, and time. No distress noted. Steady gait noted. Pleasant affect noted. Patient denies pain at this time. Patient reports eating and sleeping and without difficulty. Patient denies burning with urination. Patient denies hematuria. Patient denies diarrhea. Patient reports a strong urine stream without urgency. Patient reports that he gets up on average 4-5 times per night. Patient reports urgency has decreased within the last few days. Patient reports hot flashed. Patient reports he got a six month lupron shot last Friday. Reported all findings to Dr. Roselind Messier.

## 2013-01-03 ENCOUNTER — Ambulatory Visit
Admission: RE | Admit: 2013-01-03 | Discharge: 2013-01-03 | Disposition: A | Discharge status: Home / Self Care | Payer: Medicare Other | Source: Ambulatory Visit | Attending: Radiation Oncology | Admitting: Radiation Oncology

## 2013-01-04 ENCOUNTER — Ambulatory Visit
Admission: RE | Admit: 2013-01-04 | Discharge: 2013-01-04 | Disposition: A | Discharge status: Home / Self Care | Payer: Medicare Other | Source: Ambulatory Visit | Attending: Radiation Oncology | Admitting: Radiation Oncology

## 2013-01-05 ENCOUNTER — Ambulatory Visit
Admission: RE | Admit: 2013-01-05 | Discharge: 2013-01-05 | Disposition: A | Discharge status: Home / Self Care | Payer: Medicare Other | Source: Ambulatory Visit | Attending: Radiation Oncology | Admitting: Radiation Oncology

## 2013-01-06 ENCOUNTER — Ambulatory Visit
Admission: RE | Admit: 2013-01-06 | Discharge: 2013-01-06 | Disposition: A | Discharge status: Home / Self Care | Payer: Medicare Other | Source: Ambulatory Visit | Attending: Radiation Oncology | Admitting: Radiation Oncology

## 2013-01-09 ENCOUNTER — Ambulatory Visit
Admission: RE | Admit: 2013-01-09 | Discharge: 2013-01-09 | Disposition: A | Discharge status: Home / Self Care | Payer: Medicare Other | Source: Ambulatory Visit | Attending: Radiation Oncology | Admitting: Radiation Oncology

## 2013-01-10 ENCOUNTER — Ambulatory Visit
Admission: RE | Admit: 2013-01-10 | Discharge: 2013-01-10 | Disposition: A | Discharge status: Home / Self Care | Payer: Medicare Other | Source: Ambulatory Visit | Attending: Radiation Oncology | Admitting: Radiation Oncology

## 2013-01-10 ENCOUNTER — Encounter: Payer: Self-pay | Admitting: Radiation Oncology

## 2013-01-10 VITALS — BP 139/84 | HR 69 | Resp 18 | Wt 211.5 lb

## 2013-01-10 DIAGNOSIS — C61 Malignant neoplasm of prostate: Secondary | ICD-10-CM

## 2013-01-10 NOTE — Progress Notes (Signed)
Patient presents to the clinic today for PUT with Dr. Roselind Messier. Patient is alert and oriented to person, place, and time. No distress noted. Steady gait noted. Pleasant affect noted. Patient denies pain at this time. Patient states,"there is no changes from last week." Patient reports urgency and frequency continue. Patient reports he continues to get up during the night 4-5 time to void. Patient denies burning with urination or hematuria. Patient denies diarrhea. Patient denies fatigue. Reported all findings to Dr. Roselind Messier.

## 2013-01-10 NOTE — Progress Notes (Signed)
  Radiation Oncology         [phone] ________________________________  Name: [name] MRN: [id number]  Date: 01/10/2013  DOB: 06-13-1941  Weekly Radiation Therapy Management  Current Dose: 27.3 Gy     Planned Dose:  78 Gy  Narrative . . . . . . . . The patient presents for routine under treatment assessment.                                                     The patient is without complaint.  He has urgency and frequency during the day but does not wish any medication for this issue. He also gets up approximately 4-5 times in the evening but sleeps well in between. He feels this is his baseline level.  He denies any bowel complaints.                                 Set-up films were reviewed.                                 The chart was checked. Physical Findings. . .  weight is 211 lb 8 oz (95.936 kg). His blood pressure is 139/84 and his pulse is 69. His respiration is 18. . Weight essentially stable.  No significant changes. Impression . . . . . . . The patient is  tolerating radiation. Plan . . . . . . . . . . . . Continue treatment as planned.  ________________________________  -----------------------------------  Billie Lade, PhD, MD

## 2013-01-11 ENCOUNTER — Ambulatory Visit
Admission: RE | Admit: 2013-01-11 | Discharge: 2013-01-11 | Disposition: A | Discharge status: Home / Self Care | Payer: Medicare Other | Source: Ambulatory Visit | Attending: Radiation Oncology | Admitting: Radiation Oncology

## 2013-01-12 ENCOUNTER — Ambulatory Visit
Admission: RE | Admit: 2013-01-12 | Discharge: 2013-01-12 | Disposition: A | Discharge status: Home / Self Care | Payer: Medicare Other | Source: Ambulatory Visit | Attending: Radiation Oncology | Admitting: Radiation Oncology

## 2013-01-13 ENCOUNTER — Ambulatory Visit
Admission: RE | Admit: 2013-01-13 | Discharge: 2013-01-13 | Disposition: A | Discharge status: Home / Self Care | Payer: Medicare Other | Source: Ambulatory Visit | Attending: Radiation Oncology | Admitting: Radiation Oncology

## 2013-01-16 ENCOUNTER — Ambulatory Visit
Admission: RE | Admit: 2013-01-16 | Discharge: 2013-01-16 | Disposition: A | Discharge status: Home / Self Care | Payer: Medicare Other | Source: Ambulatory Visit | Attending: Radiation Oncology | Admitting: Radiation Oncology

## 2013-01-17 ENCOUNTER — Encounter: Payer: Self-pay | Admitting: Radiation Oncology

## 2013-01-17 ENCOUNTER — Ambulatory Visit
Admission: RE | Admit: 2013-01-17 | Discharge: 2013-01-17 | Disposition: A | Discharge status: Home / Self Care | Payer: Medicare Other | Source: Ambulatory Visit | Attending: Radiation Oncology | Admitting: Radiation Oncology

## 2013-01-17 VITALS — BP 135/70 | HR 55 | Resp 16 | Wt 214.6 lb

## 2013-01-17 DIAGNOSIS — C61 Malignant neoplasm of prostate: Secondary | ICD-10-CM

## 2013-01-17 NOTE — Progress Notes (Signed)
  Radiation Oncology         [phone] ________________________________  Name: [name] MRN: [id number]  Date: 01/17/2013  DOB: 1941/06/30  Weekly Radiation Therapy Management  Current Dose: 37.05 Gy     Planned Dose:  78 Gy  Narrative . . . . . . . . The patient presents for routine under treatment assessment.                                                     The patient is without complaint. He continues to have urinary frequency at night which is no more significant than his baseline. He denies any dysuria or bowel complaints.  He does have some mild fatigue                                 Set-up films were reviewed.                                 The chart was checked. Physical Findings. . .  weight is 214 lb 9.6 oz (97.342 kg). His blood pressure is 135/70 and his pulse is 55. His respiration is 16. . Weight essentially stable.  No significant changes. Impression . . . . . . . The patient is  tolerating radiation. Plan . . . . . . . . . . . . Continue treatment as planned.  ________________________________  -----------------------------------  Billie Lade, PhD, MD

## 2013-01-17 NOTE — Progress Notes (Signed)
Patient presents to the clinic today unaccompanied for PUT with Dr. Roselind Messier. Patient alert and oriented to person, place, and time. No distress noted. Steady gait noted. Pleasant affect noted. Patient denies pain at this time. Patient denies burning with urination. Patient denies hematuria. Patient denies diarrhea. Patient reports on average he gets up 4-5 times per night to void. Patient reports a strong normal urine stream. Patient reports fatigue. Reported all findings to Dr. Roselind Messier.

## 2013-01-18 ENCOUNTER — Ambulatory Visit
Admission: RE | Admit: 2013-01-18 | Discharge: 2013-01-18 | Disposition: A | Discharge status: Home / Self Care | Payer: Medicare Other | Source: Ambulatory Visit | Attending: Radiation Oncology | Admitting: Radiation Oncology

## 2013-01-19 ENCOUNTER — Ambulatory Visit
Admission: RE | Admit: 2013-01-19 | Discharge: 2013-01-19 | Disposition: A | Discharge status: Home / Self Care | Payer: Medicare Other | Source: Ambulatory Visit | Attending: Radiation Oncology | Admitting: Radiation Oncology

## 2013-01-20 ENCOUNTER — Ambulatory Visit
Admission: RE | Admit: 2013-01-20 | Discharge: 2013-01-20 | Disposition: A | Discharge status: Home / Self Care | Payer: Medicare Other | Source: Ambulatory Visit | Attending: Radiation Oncology | Admitting: Radiation Oncology

## 2013-01-23 ENCOUNTER — Ambulatory Visit
Admission: RE | Admit: 2013-01-23 | Discharge: 2013-01-23 | Disposition: A | Discharge status: Home / Self Care | Payer: Medicare Other | Source: Ambulatory Visit | Attending: Radiation Oncology | Admitting: Radiation Oncology

## 2013-01-24 ENCOUNTER — Ambulatory Visit
Admission: RE | Admit: 2013-01-24 | Discharge: 2013-01-24 | Disposition: A | Discharge status: Home / Self Care | Payer: Medicare Other | Source: Ambulatory Visit | Attending: Radiation Oncology | Admitting: Radiation Oncology

## 2013-01-24 ENCOUNTER — Encounter: Payer: Self-pay | Admitting: Radiation Oncology

## 2013-01-24 VITALS — BP 132/72 | HR 83 | Resp 18 | Wt 214.4 lb

## 2013-01-24 DIAGNOSIS — C61 Malignant neoplasm of prostate: Secondary | ICD-10-CM

## 2013-01-24 NOTE — Progress Notes (Signed)
  Radiation Oncology         [phone] ________________________________  Name: [name] MRN: [id number]  Date: 01/24/2013  DOB: [birth year]  Weekly Radiation Therapy Management  Current Dose: 46.8 Gy     Planned Dose:  78 Gy  Narrative . . . . . . . . The patient presents for routine under treatment assessment.                                                     The patient is without complaint.  He has no significant urinary symptoms or bowel changes. His energy level continues to be good.                                 Set-up films were reviewed.                                 The chart was checked. Physical Findings. . .  weight is 214 lb 6.4 oz (97.251 kg). His blood pressure is 132/72 and his pulse is 83. His respiration is 18. . Weight essentially stable.  No significant changes. Impression . . . . . . . The patient is  tolerating radiation. Plan . . . . . . . . . . . . Continue treatment as planned.  ________________________________  -----------------------------------  Billie Lade, PhD, MD

## 2013-01-24 NOTE — Progress Notes (Signed)
Patient presents to the clinic today unaccompanied for PUT with Dr. Roselind Messier. Patient alert and oriented to person, place, and time. No distress noted. Steady gait noted. Pleasant affect noted. Patient denies pain at this time. Patient denies that urinary frequency is any worse than baseline. Patient denies hematuria. Patient denies burning with urination. Patient denies diarrhea. Patient reports mild fatigue. Reported all findings to Dr. Roselind Messier.

## 2013-01-25 ENCOUNTER — Ambulatory Visit
Admission: RE | Admit: 2013-01-25 | Discharge: 2013-01-25 | Disposition: A | Discharge status: Home / Self Care | Payer: Medicare Other | Source: Ambulatory Visit | Attending: Radiation Oncology | Admitting: Radiation Oncology

## 2013-01-26 ENCOUNTER — Ambulatory Visit
Admission: RE | Admit: 2013-01-26 | Discharge: 2013-01-26 | Disposition: A | Discharge status: Home / Self Care | Payer: Medicare Other | Source: Ambulatory Visit | Attending: Radiation Oncology | Admitting: Radiation Oncology

## 2013-01-27 ENCOUNTER — Ambulatory Visit
Admission: RE | Admit: 2013-01-27 | Discharge: 2013-01-27 | Disposition: A | Discharge status: Home / Self Care | Payer: Medicare Other | Source: Ambulatory Visit | Attending: Radiation Oncology | Admitting: Radiation Oncology

## 2013-01-30 ENCOUNTER — Other Ambulatory Visit: Payer: Self-pay | Admitting: Cardiology

## 2013-01-30 ENCOUNTER — Ambulatory Visit
Admission: RE | Admit: 2013-01-30 | Discharge: 2013-01-30 | Disposition: A | Discharge status: Home / Self Care | Payer: Medicare Other | Source: Ambulatory Visit | Attending: Radiation Oncology | Admitting: Radiation Oncology

## 2013-01-31 ENCOUNTER — Ambulatory Visit
Admission: RE | Admit: 2013-01-31 | Discharge: 2013-01-31 | Disposition: A | Discharge status: Home / Self Care | Payer: Medicare Other | Source: Ambulatory Visit | Attending: Radiation Oncology | Admitting: Radiation Oncology

## 2013-01-31 ENCOUNTER — Encounter: Payer: Self-pay | Admitting: Cardiology

## 2013-01-31 ENCOUNTER — Encounter: Payer: Self-pay | Admitting: Radiation Oncology

## 2013-01-31 VITALS — BP 102/54 | HR 50 | Temp 97.5°F | Resp 20 | Wt 219.7 lb

## 2013-01-31 DIAGNOSIS — C61 Malignant neoplasm of prostate: Secondary | ICD-10-CM

## 2013-01-31 NOTE — Progress Notes (Signed)
[facility]    Radiation Oncology [address] [place]     Maryln Gottron, M.D. [place], [place] [address]               Billie Lade, M.D., Ph.D. Phone: [phone]      Molli Hazard A. Kathrynn Running, M.D. Fax: [phone]      Radene Gunning, M.D., Ph.D.         Lurline Hare, M.D.         Grayland Jack, M.D Weekly Treatment Management Note  Name: [name]     MRN: [id number]        CSN: [id number] Date: 01/31/2013      DOB: [birth year]  CC: Daniel Reichert, MD         [facility]    Status: Outpatient  Diagnosis: The encounter diagnosis was Prostate cancer.  Current Dose: 56.56 Gy  Current Fraction: 29  Planned Dose: 78 Gy  Narrative: [name] was seen today for weekly treatment management. The chart was checked and KV/KV  were reviewed. He continues to tolerate his treatment quite well. He has nocturia 4-5 times which is his baseline. He denies any dysuria or bowel complaints. His energy level is good.  Fluoride preparations Current Outpatient Prescriptions  Medication Sig Dispense Refill  . acetaminophen (TYLENOL) 500 MG tablet Take 500 mg by mouth every 6 (six) hours as needed.        Marland Kitchen allopurinol (ZYLOPRIM) 300 MG tablet Take 300 mg by mouth daily.        Marland Kitchen ascorbic acid (VITAMIN C) 250 MG CHEW Chew 500 mg by mouth daily.      . chlorthalidone (HYGROTON) 25 MG tablet TAKE ONE TABLET BY MOUTH EVERY DAY  90 tablet  3  . diltiazem (CARDIZEM) 60 MG tablet Take 1 tablet (60 mg total) by mouth 3 (three) times daily.  90 tablet  6  . diphenhydrAMINE (SOMINEX) 25 MG tablet Take 25 mg by mouth at bedtime as needed.        Marland Kitchen guaifenesin (WAL-TUSSIN) 100 MG/5ML syrup Take 200 mg by mouth 3 (three) times daily as needed.        . lovastatin (MEVACOR) 40 MG tablet TAKE ONE TABLET BY MOUTH EVERY DAY AT BEDTIME  30 tablet  3  . metFORMIN (GLUCOPHAGE) 500 MG tablet Take 500 mg by mouth 2 (two) times daily.        . metoprolol (LOPRESSOR) 100 MG tablet TAKE ONE-HALF  TABLET BY MOUTH TWICE DAILY  90 tablet  6  . NON FORMULARY Take 3 tablets by mouth daily. MANGA-CAL      . vitamin E 400 UNIT capsule Take 1,000 Units by mouth daily.       Marland Kitchen warfarin (COUMADIN) 5 MG tablet Take 5 mg by mouth daily. Or as directed by anticoagulation clinic       . zinc gluconate 50 MG tablet Take 50 mg by mouth daily. Takes 1/2 tablet daily      . captopril (CAPOTEN) 100 MG tablet TAKE ONE-HALF TABLET BY MOUTH TWICE DAILY  30 tablet  0   No current facility-administered medications for this encounter.   Labs:  Lab Results  Component Value Date   WBC 8.1 01/01/2012   HGB 15.4 01/01/2012   HCT 48.0 01/01/2012   MCV 100.2* 01/01/2012   PLT 251 01/01/2012   Lab Results  Component Value Date   CREATININE 1.11 01/07/2010   BUN 26* 01/07/2010   NA  140 01/07/2010   K 4.4 01/07/2010   CL 100 01/07/2010   CO2 25 01/07/2010   Lab Results  Component Value Date   ALT 15 01/07/2010   AST 19 01/07/2010   BILITOT 0.6 01/07/2010    Physical Examination:  weight is 219 lb 11.2 oz (99.655 kg). His oral temperature is 97.5 F (36.4 C). His blood pressure is 102/54 and his pulse is 50. His respiration is 20.    Wt Readings from Last 3 Encounters:  01/31/13 219 lb 11.2 oz (99.655 kg)  01/24/13 214 lb 6.4 oz (97.251 kg)  01/17/13 214 lb 9.6 oz (97.342 kg)     Lungs - Normal respiratory effort, chest expands symmetrically. Lungs are clear to auscultation, no crackles or wheezes.  Heart has regular rhythm and rate  Abdomen is soft and non tender with normal bowel sounds  Assessment:  Patient tolerating treatments well  Plan: Continue treatment per original radiation prescription

## 2013-01-31 NOTE — Progress Notes (Signed)
Pt denies pain, fatigue, loss of appetite, urinary/bowel issues. He states he gets up 5 times at night to void, but this is not new for him.

## 2013-01-31 NOTE — Telephone Encounter (Signed)
CALLED PT AND LM TO CALL OFFICE TO MAKE 1 YR OV. NUMBER PROVIDED. Fax Received. Refill Completed. Ladene Allocca Chowoe (R.M.A)

## 2013-02-01 ENCOUNTER — Ambulatory Visit
Admission: RE | Admit: 2013-02-01 | Discharge: 2013-02-01 | Disposition: A | Discharge status: Home / Self Care | Payer: Medicare Other | Source: Ambulatory Visit | Attending: Radiation Oncology | Admitting: Radiation Oncology

## 2013-02-02 ENCOUNTER — Ambulatory Visit
Admission: RE | Admit: 2013-02-02 | Discharge: 2013-02-02 | Disposition: A | Discharge status: Home / Self Care | Payer: Medicare Other | Source: Ambulatory Visit | Attending: Radiation Oncology | Admitting: Radiation Oncology

## 2013-02-03 ENCOUNTER — Ambulatory Visit
Admission: RE | Admit: 2013-02-03 | Discharge: 2013-02-03 | Disposition: A | Discharge status: Home / Self Care | Payer: Medicare Other | Source: Ambulatory Visit | Attending: Radiation Oncology | Admitting: Radiation Oncology

## 2013-02-04 ENCOUNTER — Ambulatory Visit
Admission: RE | Admit: 2013-02-04 | Discharge: 2013-02-04 | Disposition: A | Discharge status: Home / Self Care | Payer: Medicare Other | Source: Ambulatory Visit | Attending: Radiation Oncology | Admitting: Radiation Oncology

## 2013-02-06 ENCOUNTER — Ambulatory Visit
Admission: RE | Admit: 2013-02-06 | Discharge: 2013-02-06 | Disposition: A | Discharge status: Home / Self Care | Payer: Medicare Other | Source: Ambulatory Visit | Attending: Radiation Oncology | Admitting: Radiation Oncology

## 2013-02-07 ENCOUNTER — Encounter: Payer: Self-pay | Admitting: Radiation Oncology

## 2013-02-07 ENCOUNTER — Ambulatory Visit
Admission: RE | Admit: 2013-02-07 | Discharge: 2013-02-07 | Disposition: A | Discharge status: Home / Self Care | Payer: Medicare Other | Source: Ambulatory Visit | Attending: Radiation Oncology | Admitting: Radiation Oncology

## 2013-02-07 VITALS — BP 129/75 | HR 61 | Temp 97.4°F | Resp 20 | Wt 219.5 lb

## 2013-02-07 DIAGNOSIS — C61 Malignant neoplasm of prostate: Secondary | ICD-10-CM

## 2013-02-07 NOTE — Progress Notes (Signed)
[facility]    Radiation Oncology [address] [place]     Maryln Gottron, M.D. [place], [place] [address]               Billie Lade, M.D., Ph.D. Phone: [phone]      Molli Hazard A. Kathrynn Running, M.D. Fax: [phone]      Radene Gunning, M.D., Ph.D.         Lurline Hare, M.D.         Grayland Jack, M.D Weekly Treatment Management Note  Name: [name]     MRN: [id number]        CSN: [id number] Date: 02/07/2013      DOB: July 26, 1941  CC: [name], MD         [facility]    Status: Outpatient  Diagnosis: The encounter diagnosis was Prostate cancer.  Current Dose: 66.3 Gy  Current Fraction: 34  Planned Dose: 78 Gy  Narrative: [name] was seen today for weekly treatment management. The chart was checked and KV/KV  were reviewed. He is starting to have some fatigue,  taking extra naps during the day. He is also noticed increased urinary frequency during the day but no significant dysuria. He denies any bowel complaints.  Fluoride preparations  Current Outpatient Prescriptions  Medication Sig Dispense Refill  . acetaminophen (TYLENOL) 500 MG tablet Take 500 mg by mouth every 6 (six) hours as needed.        Marland Kitchen allopurinol (ZYLOPRIM) 300 MG tablet Take 300 mg by mouth daily.        Marland Kitchen ascorbic acid (VITAMIN C) 250 MG CHEW Chew 500 mg by mouth daily.      . captopril (CAPOTEN) 100 MG tablet TAKE ONE-HALF TABLET BY MOUTH TWICE DAILY  30 tablet  0  . chlorthalidone (HYGROTON) 25 MG tablet TAKE ONE TABLET BY MOUTH EVERY DAY  90 tablet  3  . diltiazem (CARDIZEM) 60 MG tablet Take 1 tablet (60 mg total) by mouth 3 (three) times daily.  90 tablet  6  . diphenhydrAMINE (SOMINEX) 25 MG tablet Take 25 mg by mouth at bedtime as needed.        Marland Kitchen guaifenesin (WAL-TUSSIN) 100 MG/5ML syrup Take 200 mg by mouth 3 (three) times daily as needed.        . lovastatin (MEVACOR) 40 MG tablet TAKE ONE TABLET BY MOUTH EVERY DAY AT BEDTIME  30 tablet  3  . metFORMIN  (GLUCOPHAGE) 500 MG tablet Take 500 mg by mouth 2 (two) times daily.        . metoprolol (LOPRESSOR) 100 MG tablet TAKE ONE-HALF TABLET BY MOUTH TWICE DAILY  90 tablet  6  . NON FORMULARY Take 3 tablets by mouth daily. MANGA-CAL      . vitamin E 400 UNIT capsule Take 1,000 Units by mouth daily.       Marland Kitchen warfarin (COUMADIN) 5 MG tablet Take 5 mg by mouth daily. Or as directed by anticoagulation clinic       . zinc gluconate 50 MG tablet Take 50 mg by mouth daily. Takes 1/2 tablet daily       No current facility-administered medications for this encounter.   Labs:  Lab Results  Component Value Date   WBC 8.1 01/01/2012   HGB 15.4 01/01/2012   HCT 48.0 01/01/2012   MCV 100.2* 01/01/2012   PLT 251 01/01/2012   Lab Results  Component Value Date   CREATININE 1.11 01/07/2010   BUN  26* 01/07/2010   NA 140 01/07/2010   K 4.4 01/07/2010   CL 100 01/07/2010   CO2 25 01/07/2010   Lab Results  Component Value Date   ALT 15 01/07/2010   AST 19 01/07/2010   BILITOT 0.6 01/07/2010    Physical Examination:  weight is 219 lb 8 oz (99.565 kg). His oral temperature is 97.4 F (36.3 C). His blood pressure is 129/75 and his pulse is 61. His respiration is 20.    Wt Readings from Last 3 Encounters:  02/07/13 219 lb 8 oz (99.565 kg)  01/31/13 219 lb 11.2 oz (99.655 kg)  01/24/13 214 lb 6.4 oz (97.251 kg)     Lungs - Normal respiratory effort, chest expands symmetrically. Lungs are clear to auscultation, no crackles or wheezes.  Heart has regular rhythm and rate  Abdomen is soft and non tender with normal bowel sounds  Assessment:  Patient tolerating treatments well except for issues as above  Plan: Continue treatment per original radiation prescription

## 2013-02-07 NOTE — Progress Notes (Signed)
Pt denies pain, loss of appetite, urinary or bowel issues. He states he is fatigued, rests during the day and "bounces back in the evening".

## 2013-02-08 ENCOUNTER — Ambulatory Visit
Admission: RE | Admit: 2013-02-08 | Discharge: 2013-02-08 | Disposition: A | Discharge status: Home / Self Care | Payer: Medicare Other | Source: Ambulatory Visit | Attending: Radiation Oncology | Admitting: Radiation Oncology

## 2013-02-09 ENCOUNTER — Ambulatory Visit
Admission: RE | Admit: 2013-02-09 | Discharge: 2013-02-09 | Disposition: A | Discharge status: Home / Self Care | Payer: Medicare Other | Source: Ambulatory Visit | Attending: Radiation Oncology | Admitting: Radiation Oncology

## 2013-02-10 ENCOUNTER — Ambulatory Visit
Admission: RE | Admit: 2013-02-10 | Discharge: 2013-02-10 | Disposition: A | Discharge status: Home / Self Care | Payer: Medicare Other | Source: Ambulatory Visit | Attending: Radiation Oncology | Admitting: Radiation Oncology

## 2013-02-13 ENCOUNTER — Ambulatory Visit
Admission: RE | Admit: 2013-02-13 | Discharge: 2013-02-13 | Disposition: A | Discharge status: Home / Self Care | Payer: Medicare Other | Source: Ambulatory Visit | Attending: Radiation Oncology | Admitting: Radiation Oncology

## 2013-02-14 ENCOUNTER — Encounter: Payer: Self-pay | Admitting: Radiation Oncology

## 2013-02-14 ENCOUNTER — Ambulatory Visit
Admission: RE | Admit: 2013-02-14 | Discharge: 2013-02-14 | Disposition: A | Discharge status: Home / Self Care | Payer: Medicare Other | Source: Ambulatory Visit | Attending: Radiation Oncology | Admitting: Radiation Oncology

## 2013-02-14 ENCOUNTER — Ambulatory Visit: Payer: Medicare Other

## 2013-02-14 VITALS — BP 113/60 | HR 58 | Temp 97.4°F | Resp 20 | Wt 217.0 lb

## 2013-02-14 DIAGNOSIS — C61 Malignant neoplasm of prostate: Secondary | ICD-10-CM

## 2013-02-14 NOTE — Progress Notes (Signed)
Pt denies pain, loss of appetite, bowel issues, dysuria; he has fatigue, urinary frequency, nocturia which he states "is not new".  Pt completes tomorrow, gave him 1 month FU card.

## 2013-02-14 NOTE — Progress Notes (Signed)
  Radiation Oncology         [phone] ________________________________  Name: [name] MRN: [id number]  Date: 02/14/2013  DOB: Apr 17, 1941  Weekly Radiation Therapy Management  Current Dose: 76.05 Gy     Planned Dose:  78 Gy  Narrative . . . . . . . . The patient presents for routine under treatment assessment.                                                     The patient is without complaint.  He continues to really no side effects with this therapy except for fatigue                                 Set-up films were reviewed.                                 The chart was checked. Physical Findings. . .  weight is 217 lb (98.431 kg). His oral temperature is 97.4 F (36.3 C). His blood pressure is 113/60 and his pulse is 58. His respiration is 20. . Weight essentially stable.  No significant changes. Impression . . . . . . . The patient is  tolerating radiation. Plan . . . . . . . . . . . . Continue treatment as planned.  He will complete treatment tomorrow and then be scheduled for one month followup.  ________________________________  -----------------------------------  Billie Lade, PhD, MD

## 2013-02-15 ENCOUNTER — Ambulatory Visit
Admission: RE | Admit: 2013-02-15 | Discharge: 2013-02-15 | Disposition: A | Discharge status: Home / Self Care | Payer: Medicare Other | Source: Ambulatory Visit | Attending: Radiation Oncology | Admitting: Radiation Oncology

## 2013-02-23 ENCOUNTER — Encounter: Payer: Self-pay | Admitting: Radiation Oncology

## 2013-02-23 NOTE — Progress Notes (Signed)
   Department of Radiation Oncology  Phone:  [phone] Fax:        [phone]  Intensity modulated radiation therapy device note  On 12/21/2012 the patient began radiation therapy directed at the prostate area. The patient will be treated with 2 rapid arcs. This constitutes 1 IMRT device.  -----------------------------------  Billie Lade, PhD, MD

## 2013-02-23 NOTE — Progress Notes (Signed)
  Radiation Oncology         [phone]) [phone] ________________________________  Name: [name] MRN: [id number]  Date: 02/23/2013  DOB: Sep 22, 1941  End of Treatment Note  Diagnosis:     Prostate cancer T3a, No, Mo  Indication for treatment:  Definitive treatment along with androgen deprivation       Radiation treatment dates:   12/21/2012 through 02/15/2013  Site/dose:   Prostate 78 gray in 40 fractions  Beams/energy:   Intensity modulated radiation therapy, rapid Arc, image guided  Narrative: The patient tolerated radiation treatment relatively well.   His only complaint during the course of radiation therapy was fatigue.  Plan: The patient has completed radiation treatment. The patient will return to radiation oncology clinic for routine followup in one month. I advised them to call or return sooner if they have any questions or concerns related to their recovery or treatment.  -----------------------------------  Billie Lade, PhD, MD

## 2013-03-06 ENCOUNTER — Other Ambulatory Visit: Payer: Self-pay | Admitting: Urology

## 2013-03-06 DIAGNOSIS — C61 Malignant neoplasm of prostate: Secondary | ICD-10-CM

## 2013-03-09 ENCOUNTER — Ambulatory Visit (HOSPITAL_COMMUNITY)
Admission: RE | Admit: 2013-03-09 | Discharge: 2013-03-09 | Disposition: A | Discharge status: Home / Self Care | Payer: Medicare Other | Source: Ambulatory Visit | Attending: Urology | Admitting: Urology

## 2013-03-09 DIAGNOSIS — M949 Disorder of cartilage, unspecified: Secondary | ICD-10-CM | POA: Insufficient documentation

## 2013-03-09 DIAGNOSIS — C61 Malignant neoplasm of prostate: Secondary | ICD-10-CM | POA: Insufficient documentation

## 2013-03-09 DIAGNOSIS — Z13820 Encounter for screening for osteoporosis: Secondary | ICD-10-CM | POA: Insufficient documentation

## 2013-03-09 DIAGNOSIS — M899 Disorder of bone, unspecified: Secondary | ICD-10-CM | POA: Insufficient documentation

## 2013-03-10 ENCOUNTER — Encounter: Payer: Self-pay | Admitting: Radiation Oncology

## 2013-03-10 DIAGNOSIS — Z923 Personal history of irradiation: Secondary | ICD-10-CM | POA: Insufficient documentation

## 2013-03-16 ENCOUNTER — Encounter: Payer: Self-pay | Admitting: Radiation Oncology

## 2013-03-16 ENCOUNTER — Ambulatory Visit
Admission: RE | Admit: 2013-03-16 | Discharge: 2013-03-16 | Disposition: A | Discharge status: Home / Self Care | Payer: Medicare Other | Source: Ambulatory Visit | Attending: Radiation Oncology | Admitting: Radiation Oncology

## 2013-03-16 VITALS — BP 129/67 | HR 75 | Temp 97.2°F | Resp 20 | Wt 227.2 lb

## 2013-03-16 DIAGNOSIS — C61 Malignant neoplasm of prostate: Secondary | ICD-10-CM

## 2013-03-16 NOTE — Progress Notes (Signed)
Radiation Oncology         [phone] ________________________________  Name: [name] MRN: [id number]  Date: 03/16/2013  DOB: [birth year]  Follow-Up Visit Note  CC: [name], MD  Anner Crete, MD  Diagnosis:   Prostate cancer  Interval Since Last Radiation:  1 months  Narrative:  The patient returns today for routine follow-up.  She clinically seems to be doing well at this time. His only complaint is hot flashes related to his androgen ablation therapy. He denies any dysuria or hematuria. He denies any bowel complaints. I should mention that he also has some fatigue.                              ALLERGIES:  is allergic to fluoride preparations.  Meds: Current Outpatient Prescriptions  Medication Sig Dispense Refill  . allopurinol (ZYLOPRIM) 300 MG tablet Take 300 mg by mouth daily.        Marland Kitchen ascorbic acid (VITAMIN C) 250 MG CHEW Chew 500 mg by mouth daily.      . calcium-vitamin D 250-100 MG-UNIT per tablet Take 1 tablet by mouth 2 (two) times daily.      . captopril (CAPOTEN) 100 MG tablet TAKE ONE-HALF TABLET BY MOUTH TWICE DAILY  30 tablet  0  . chlorthalidone (HYGROTON) 25 MG tablet TAKE ONE TABLET BY MOUTH EVERY DAY  90 tablet  3  . diltiazem (CARDIZEM CD) 180 MG 24 hr capsule Take 180 mg by mouth 2 (two) times daily.      Marland Kitchen guaifenesin (WAL-TUSSIN) 100 MG/5ML syrup Take 200 mg by mouth 3 (three) times daily as needed.        . lovastatin (MEVACOR) 40 MG tablet TAKE ONE TABLET BY MOUTH EVERY DAY AT BEDTIME  30 tablet  3  . metFORMIN (GLUCOPHAGE) 500 MG tablet Take 500 mg by mouth 2 (two) times daily.        . metoprolol (LOPRESSOR) 100 MG tablet TAKE ONE-HALF TABLET BY MOUTH TWICE DAILY  90 tablet  6  . NON FORMULARY Take 3 tablets by mouth daily. MANGA-CAL      . vitamin E 400 UNIT capsule Take 1,000 Units by mouth daily.       Marland Kitchen warfarin (COUMADIN) 5 MG tablet Take 5 mg by mouth daily. Or as directed by anticoagulation clinic       . zinc gluconate 50 MG  tablet Take 50 mg by mouth daily. Takes 1/2 tablet daily      . acetaminophen (TYLENOL) 500 MG tablet Take 500 mg by mouth every 6 (six) hours as needed.        . diltiazem (CARDIZEM) 60 MG tablet Take 1 tablet (60 mg total) by mouth 3 (three) times daily.  90 tablet  6   No current facility-administered medications for this encounter.    Physical Findings: The patient is in no acute distress. Patient is alert and oriented.  weight is 227 lb 3.2 oz (103.057 kg). His oral temperature is 97.2 F (36.2 C). His blood pressure is 129/67 and his pulse is 75. His respiration is 20. . The lungs are clear. The heart has a regular rhythm and rate. The abdomen is soft and nontender with normal bowel sounds.  Lab Findings: Lab Results  Component Value Date   WBC 8.1 01/01/2012   HGB 15.4 01/01/2012   HCT 48.0 01/01/2012   MCV 100.2* 01/01/2012   PLT 251 01/01/2012  Radiographic Findings: Dg Bone Density  03/09/2013   The Bone Mineral Densitometry hard-copy report (which includes all data, graphical display, and FRAX results when applicable) has been sent directly to the ordering physician.  This report can also be obtained electronically by viewing images for this exam through the performing facility's EMR, or by logging directly into [employer].   Original Report Authenticated By: Harmon Pier, M.D.    Impression:  The patient is recovering from the effects of radiation.    Plan:  Routine followup in 6 months.  _____________________________________  -----------------------------------  Billie Lade, PhD, MD

## 2013-03-16 NOTE — Progress Notes (Addendum)
Follow up prostate r ad tx, 12/21/12-02/15/13, getting stronger every day stated, slight fatigue, no dysuria, good flow, no hematuria, normal bowels, no c/o pain, last lupron 12/30/12, does have hot flashes, appt with Dr. Wilson Singer in the fall 9:47 AM

## 2013-03-28 ENCOUNTER — Other Ambulatory Visit: Payer: Self-pay | Admitting: *Deleted

## 2013-03-28 MED ORDER — CAPTOPRIL 100 MG PO TABS: ORAL_TABLET | ORAL | Status: DC

## 2013-04-11 ENCOUNTER — Encounter: Payer: Self-pay | Admitting: *Deleted

## 2013-08-18 ENCOUNTER — Encounter (INDEPENDENT_AMBULATORY_CARE_PROVIDER_SITE_OTHER): Payer: Self-pay

## 2013-08-18 ENCOUNTER — Ambulatory Visit (INDEPENDENT_AMBULATORY_CARE_PROVIDER_SITE_OTHER): Payer: Medicare Other | Admitting: Urology

## 2013-08-18 DIAGNOSIS — C61 Malignant neoplasm of prostate: Secondary | ICD-10-CM

## 2013-08-18 DIAGNOSIS — R351 Nocturia: Secondary | ICD-10-CM

## 2013-09-11 ENCOUNTER — Ambulatory Visit: Payer: Medicare Other | Admitting: Radiation Oncology

## 2013-09-11 ENCOUNTER — Encounter: Payer: Self-pay | Admitting: Radiation Oncology

## 2013-09-11 ENCOUNTER — Ambulatory Visit
Admission: RE | Admit: 2013-09-11 | Discharge: 2013-09-11 | Disposition: A | Discharge status: Home / Self Care | Payer: Medicare Other | Source: Ambulatory Visit | Attending: Radiation Oncology | Admitting: Radiation Oncology

## 2013-09-11 VITALS — BP 134/64 | HR 65 | Temp 97.3°F | Ht 74.5 in | Wt 232.1 lb

## 2013-09-11 DIAGNOSIS — C61 Malignant neoplasm of prostate: Secondary | ICD-10-CM

## 2013-09-11 NOTE — Progress Notes (Signed)
Radiation Oncology         [phone] ________________________________  Name: [name] MRN: [id number]  Date: 09/11/2013  DOB: August 31, 1941  Follow-Up Visit Note  CC: Daniel Reichert, MD  Daniel Reichert, MD  Diagnosis:   Prostate cancer T3a, No, Mo   Interval Since Last Radiation:  7  months  Narrative:  The patient returns today for routine follow-up.  He is doing well at this time except for continued problems with hot flashes related to his androgen ablation. He has been taking bee pollen which is helpful. Patient did see Dr. Annabell Gross last month and according to patient his PSA was undetectable.  He continues to have a lot of urinary frequency during the daytime and at night, approximately every hour but he is comfortable with this situation. I recommend he discuss potential medications with Dr. Annabell Gross if this becomes an issue.   patient denies any hematuria dysuria or rectal bleeding. He denies any pain with bowel movements.   He denies any new bony pain.                          ALLERGIES:  is allergic to fluoride preparations.  Meds: Current Outpatient Prescriptions  Medication Sig Dispense Refill  . acetaminophen (TYLENOL) 500 MG tablet Take 500 mg by mouth every 6 (six) hours as needed.        Marland Kitchen allopurinol (ZYLOPRIM) 300 MG tablet Take 300 mg by mouth daily.        Marland Kitchen ascorbic acid (VITAMIN C) 250 MG CHEW Chew 500 mg by mouth daily.      . calcium-vitamin D 250-100 MG-UNIT per tablet Take 1 tablet by mouth 2 (two) times daily.      . captopril (CAPOTEN) 100 MG tablet TAKE ONE-HALF TABLET BY MOUTH TWICE DAILY  30 tablet  1  . chlorthalidone (HYGROTON) 25 MG tablet TAKE ONE TABLET BY MOUTH EVERY DAY  90 tablet  3  . diltiazem (CARDIZEM) 60 MG tablet Take 1 tablet (60 mg total) by mouth 3 (three) times daily.  90 tablet  6  . guaifenesin (WAL-TUSSIN) 100 MG/5ML syrup Take 200 mg by mouth 3 (three) times daily as needed.        . lovastatin (MEVACOR) 40 MG tablet TAKE ONE  TABLET BY MOUTH EVERY DAY AT BEDTIME  30 tablet  3  . metFORMIN (GLUCOPHAGE) 500 MG tablet Take 500 mg by mouth 2 (two) times daily.        . metoprolol (LOPRESSOR) 100 MG tablet TAKE ONE-HALF TABLET BY MOUTH TWICE DAILY  90 tablet  6  . NON FORMULARY Take 3 tablets by mouth daily. MANGA-CAL      . warfarin (COUMADIN) 5 MG tablet Take 5 mg by mouth daily. Or as directed by anticoagulation clinic       . zinc gluconate 50 MG tablet Take 50 mg by mouth daily. Takes 1/2 tablet daily      . diltiazem (CARDIZEM CD) 180 MG 24 hr capsule Take 180 mg by mouth 2 (two) times daily.      . vitamin E 400 UNIT capsule Take 1,000 Units by mouth daily.        No current facility-administered medications for this encounter.    Physical Findings: The patient is in no acute distress. Patient is alert and oriented.  height is 6' 2.5" (1.892 m) and weight is 232 lb 1.6 oz (105.28 kg). His temperature is 97.3 F (  36.3 C). His blood pressure is 134/64 and his pulse is 65. His oxygen saturation is 98%. .  The lungs are clear. The heart has an irregular rhythm consistent with atrial fibrillation. The abdomen is soft and nontender with normal bowel sounds. There is no supraclavicular adenopathy.  Lab Findings: Lab Results  Component Value Date   WBC 8.1 01/01/2012   HGB 15.4 01/01/2012   HCT 48.0 01/01/2012   MCV 100.2* 01/01/2012   PLT 251 01/01/2012      Radiographic Findings: No results found.  Impression:  Locally advanced prostate cancer. He has had excellent response to his radiation and androgen ablation thus far.  Plan:  When necessary followup in radiation oncology. The patient will continue close followup with urology.  _____________________________________  -----------------------------------  Billie Lade, PhD, MD

## 2013-09-11 NOTE — Progress Notes (Signed)
[name] here for follow up after treatment to his prostate.  He denies pain.  He does have urinary frequency and gets up every hour during the night.  He denies dysuria, hematuria or diarrhea.  He does have fatigue which he thinks is from his "hormone treatments".

## 2013-09-14 ENCOUNTER — Ambulatory Visit: Payer: Medicare Other | Admitting: Radiation Oncology

## 2013-10-20 ENCOUNTER — Other Ambulatory Visit: Payer: Self-pay | Admitting: Cardiology

## 2013-10-27 ENCOUNTER — Ambulatory Visit (INDEPENDENT_AMBULATORY_CARE_PROVIDER_SITE_OTHER): Payer: Medicare HMO | Admitting: Urology

## 2013-10-27 DIAGNOSIS — R3129 Other microscopic hematuria: Secondary | ICD-10-CM

## 2013-10-27 DIAGNOSIS — N402 Nodular prostate without lower urinary tract symptoms: Secondary | ICD-10-CM

## 2013-10-27 DIAGNOSIS — N3941 Urge incontinence: Secondary | ICD-10-CM

## 2013-12-01 ENCOUNTER — Other Ambulatory Visit: Payer: Self-pay | Admitting: Urology

## 2013-12-01 ENCOUNTER — Ambulatory Visit (INDEPENDENT_AMBULATORY_CARE_PROVIDER_SITE_OTHER): Payer: Medicare HMO | Admitting: Urology

## 2013-12-01 DIAGNOSIS — R310 Gross hematuria: Secondary | ICD-10-CM

## 2013-12-01 DIAGNOSIS — N3941 Urge incontinence: Secondary | ICD-10-CM

## 2013-12-08 ENCOUNTER — Encounter: Payer: Self-pay | Admitting: Cardiovascular Disease

## 2013-12-08 ENCOUNTER — Encounter (INDEPENDENT_AMBULATORY_CARE_PROVIDER_SITE_OTHER): Payer: Self-pay

## 2013-12-08 ENCOUNTER — Ambulatory Visit (INDEPENDENT_AMBULATORY_CARE_PROVIDER_SITE_OTHER): Payer: Medicare HMO | Admitting: Cardiovascular Disease

## 2013-12-08 VITALS — BP 134/86 | HR 97 | Ht 74.0 in | Wt 220.0 lb

## 2013-12-08 DIAGNOSIS — I10 Essential (primary) hypertension: Secondary | ICD-10-CM

## 2013-12-08 DIAGNOSIS — I4891 Unspecified atrial fibrillation: Secondary | ICD-10-CM

## 2013-12-08 DIAGNOSIS — Z7901 Long term (current) use of anticoagulants: Secondary | ICD-10-CM

## 2013-12-08 NOTE — Progress Notes (Signed)
Patient ID: [name], male   DOB: Aug 20, 1941, 73 y.o.   MRN: [id number]      SUBJECTIVE: The patient is a 73 year old male with a history of permanent atrial fibrillation, hypertension, and hyperlipidemia. He is maintained on warfarin for anticoagulation therapy. The patient denies any symptoms of chest pain, palpitations, shortness of breath, lightheadedness, dizziness, leg swelling, orthopnea, PND, and syncope.  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    Allergies  Allergen Reactions  . Fluoride Preparations     Current Outpatient Prescriptions  Medication Sig Dispense Refill  . acetaminophen (TYLENOL) 500 MG tablet Take 500 mg by mouth every 6 (six) hours as needed.        Marland Kitchen allopurinol (ZYLOPRIM) 300 MG tablet Take 300 mg by mouth daily.        Marland Kitchen ascorbic acid (VITAMIN C) 250 MG CHEW Chew 500 mg by mouth daily.      . calcium-vitamin D 250-100 MG-UNIT per tablet Take 1 tablet by mouth 2 (two) times daily.      . captopril (CAPOTEN) 100 MG tablet TAKE ONE-HALF TABLET BY MOUTH TWICE DAILY  30 tablet  1  . chlorthalidone (HYGROTON) 25 MG tablet TAKE ONE TABLET BY MOUTH EVERY DAY  90 tablet  3  . diltiazem (CARDIZEM CD) 180 MG 24 hr capsule Take 180 mg by mouth 2 (two) times daily.      Marland Kitchen diltiazem (CARDIZEM) 60 MG tablet Take 1 tablet (60 mg total) by mouth 3 (three) times daily.  90 tablet  6  . guaifenesin (WAL-TUSSIN) 100 MG/5ML syrup Take 200 mg by mouth 3 (three) times daily as needed.        . lovastatin (MEVACOR) 40 MG tablet TAKE ONE TABLET BY MOUTH EVERY DAY AT BEDTIME  30 tablet  3  . metFORMIN (GLUCOPHAGE) 500 MG tablet Take 500 mg by mouth 2 (two) times daily.        . metoprolol (LOPRESSOR) 100 MG tablet TAKE ONE-HALF TABLET BY MOUTH TWICE DAILY  90 tablet  6  . NON FORMULARY Take 3 tablets by mouth daily. MANGA-CAL      . vitamin E 400 UNIT capsule Take 1,000 Units by mouth daily.       Marland Kitchen warfarin  (COUMADIN) 5 MG tablet Take 5 mg by mouth daily. Or as directed by anticoagulation clinic       . zinc gluconate 50 MG tablet Take 50 mg by mouth daily. Takes 1/2 tablet daily       No current facility-administered medications for this visit.    Past Medical History  Diagnosis Date  . Chronic atrial fibrillation   . Hypertension   . Hyperlipidemia   . Diabetes mellitus, type II     no insulin  . Chronic bronchitis   . History of herpes zoster virus   . Gout   . Coronary artery disease      PTCA of the LAD in 09/1998; recath for a few weeks later showed no restenosis; apical MI in 10/1998  . Cancer   . Prostate cancer 2014    EBRT + hormonal therapy  . History of radiation therapy 12/21/12- 02/15/13    prostate 62 gray in 40 fx    Past Surgical History  Procedure Laterality Date  . Knee surgery      left knee  . Prostate biopsy      History   Social History  . Marital  Status: Married    Spouse Name: N/A    Number of Children: N/A  . Years of Education: N/A   Occupational History  . Not on file.   Social History Main Topics  . Smoking status: Never Smoker   . Smokeless tobacco: Never Used  . Alcohol Use: No  . Drug Use: No  . Sexual Activity: Not on file   Other Topics Concern  . Not on file   Social History Narrative  . No narrative on file     Filed Vitals:   12/08/13 1104  Height: 6\' 2"  (1.88 m)  Weight: 220 lb (99.791 kg)   BP 134/86  Pulse 97   PHYSICAL EXAM General: NAD Neck: No JVD, no thyromegaly. Lungs: Clear to auscultation bilaterally with normal respiratory effort. CV: Nondisplaced PMI.  Regular rate and irregular rhythm, normal S1/S2, no S3, no murmur. No pretibial or periankle edema.  No carotid bruit.  Normal pedal pulses.  Abdomen: Soft, nontender, no hepatosplenomegaly, no distention.  Neurologic: Alert and oriented x 3.  Psych: Normal affect. Extremities: No clubbing or cyanosis.   ECG: reviewed and available in electronic  records. (atrial fibrillation, nonspecific T wave abnormality, 62 bpm)    ASSESSMENT AND PLAN: 1. Permanent atrial fibrillation: resting HR is controlled at 62 bpm by ECG today. Will not making any changes to current diltiazem regimen. Continue warfarin for anticoagulation. 2. HTN: controlled on present therapy.  Dispo: f/u 1 year.  Kate Sable, M.D., F.A.C.C.

## 2013-12-08 NOTE — Patient Instructions (Addendum)
Your physician wants you to follow-up in: 1 year You will receive a reminder letter in the mail two months in advance. If you don't receive a letter, please call our office to schedule the follow-up appointment.    Your physician recommends that you continue on your current medications as directed. Please refer to the Current Medication list given to you today.     Thank you for choosing [facility] !  

## 2013-12-08 NOTE — Addendum Note (Signed)
Addended by: Barbarann Ehlers A on: 12/08/2013 01:01 PM   Modules accepted: Orders

## 2013-12-15 ENCOUNTER — Ambulatory Visit (HOSPITAL_COMMUNITY)
Admission: RE | Admit: 2013-12-15 | Discharge: 2013-12-15 | Disposition: A | Discharge status: Home / Self Care | Payer: Medicare HMO | Source: Ambulatory Visit | Attending: Urology | Admitting: Urology

## 2013-12-15 DIAGNOSIS — R911 Solitary pulmonary nodule: Secondary | ICD-10-CM | POA: Insufficient documentation

## 2013-12-15 DIAGNOSIS — R310 Gross hematuria: Secondary | ICD-10-CM | POA: Insufficient documentation

## 2013-12-15 DIAGNOSIS — K8020 Calculus of gallbladder without cholecystitis without obstruction: Secondary | ICD-10-CM | POA: Insufficient documentation

## 2013-12-15 DIAGNOSIS — Z8546 Personal history of malignant neoplasm of prostate: Secondary | ICD-10-CM | POA: Insufficient documentation

## 2013-12-15 LAB — POCT I-STAT CREATININE: Creatinine, Ser: 1.4 mg/dL — ABNORMAL HIGH (ref 0.50–1.35)

## 2013-12-15 MED ORDER — IOHEXOL 300 MG/ML  SOLN
125.0000 mL | Freq: Once | INTRAMUSCULAR | Status: AC | PRN
  Administered 2013-12-15: 125 mL via INTRAVENOUS

## 2013-12-26 ENCOUNTER — Other Ambulatory Visit: Payer: Self-pay | Admitting: Urology

## 2013-12-26 DIAGNOSIS — C61 Malignant neoplasm of prostate: Secondary | ICD-10-CM

## 2013-12-26 DIAGNOSIS — R911 Solitary pulmonary nodule: Secondary | ICD-10-CM

## 2014-01-02 ENCOUNTER — Ambulatory Visit (HOSPITAL_COMMUNITY)
Admission: RE | Admit: 2014-01-02 | Discharge: 2014-01-02 | Disposition: A | Discharge status: Home / Self Care | Payer: Medicare HMO | Source: Ambulatory Visit | Attending: Urology | Admitting: Urology

## 2014-01-02 DIAGNOSIS — Z8546 Personal history of malignant neoplasm of prostate: Secondary | ICD-10-CM | POA: Insufficient documentation

## 2014-01-02 DIAGNOSIS — C61 Malignant neoplasm of prostate: Secondary | ICD-10-CM

## 2014-01-02 DIAGNOSIS — R911 Solitary pulmonary nodule: Secondary | ICD-10-CM

## 2014-01-02 DIAGNOSIS — R918 Other nonspecific abnormal finding of lung field: Secondary | ICD-10-CM | POA: Insufficient documentation

## 2014-01-02 MED ORDER — IOHEXOL 300 MG/ML  SOLN
100.0000 mL | Freq: Once | INTRAMUSCULAR | Status: AC | PRN
  Administered 2014-01-02: 80 mL via INTRAVENOUS

## 2014-01-26 DEATH — deceased

## 2014-02-16 ENCOUNTER — Ambulatory Visit (INDEPENDENT_AMBULATORY_CARE_PROVIDER_SITE_OTHER): Payer: Medicare HMO | Admitting: Urology

## 2014-02-16 DIAGNOSIS — C61 Malignant neoplasm of prostate: Secondary | ICD-10-CM

## 2014-02-16 DIAGNOSIS — R3129 Other microscopic hematuria: Secondary | ICD-10-CM

## 2014-02-16 DIAGNOSIS — N3941 Urge incontinence: Secondary | ICD-10-CM

## 2014-07-13 ENCOUNTER — Other Ambulatory Visit: Payer: Self-pay | Admitting: Urology

## 2014-07-13 DIAGNOSIS — R911 Solitary pulmonary nodule: Secondary | ICD-10-CM

## 2014-08-09 ENCOUNTER — Ambulatory Visit (HOSPITAL_COMMUNITY)
Admission: RE | Admit: 2014-08-09 | Discharge: 2014-08-09 | Disposition: A | Discharge status: Home / Self Care | Payer: Medicare HMO | Source: Ambulatory Visit | Attending: Urology | Admitting: Urology

## 2014-08-09 DIAGNOSIS — R05 Cough: Secondary | ICD-10-CM | POA: Diagnosis not present

## 2014-08-09 DIAGNOSIS — Z09 Encounter for follow-up examination after completed treatment for conditions other than malignant neoplasm: Secondary | ICD-10-CM | POA: Diagnosis not present

## 2014-08-09 DIAGNOSIS — R911 Solitary pulmonary nodule: Secondary | ICD-10-CM | POA: Insufficient documentation

## 2014-08-09 DIAGNOSIS — R0602 Shortness of breath: Secondary | ICD-10-CM | POA: Diagnosis not present

## 2014-08-09 LAB — POCT I-STAT CREATININE: Creatinine, Ser: 1.2 mg/dL (ref 0.50–1.35)

## 2014-08-09 MED ORDER — IOHEXOL 300 MG/ML  SOLN
80.0000 mL | Freq: Once | INTRAMUSCULAR | Status: AC | PRN
  Administered 2014-08-09: 80 mL via INTRAVENOUS

## 2014-08-17 ENCOUNTER — Other Ambulatory Visit: Payer: Self-pay | Admitting: Urology

## 2014-08-17 ENCOUNTER — Ambulatory Visit (INDEPENDENT_AMBULATORY_CARE_PROVIDER_SITE_OTHER): Payer: Medicare HMO | Admitting: Urology

## 2014-08-17 DIAGNOSIS — C61 Malignant neoplasm of prostate: Secondary | ICD-10-CM

## 2014-08-17 DIAGNOSIS — R911 Solitary pulmonary nodule: Secondary | ICD-10-CM

## 2014-08-17 DIAGNOSIS — N3941 Urge incontinence: Secondary | ICD-10-CM

## 2014-12-27 ENCOUNTER — Ambulatory Visit: Payer: Medicare HMO | Admitting: Cardiovascular Disease

## 2014-12-28 ENCOUNTER — Encounter: Payer: Self-pay | Admitting: Cardiovascular Disease

## 2014-12-28 ENCOUNTER — Ambulatory Visit (INDEPENDENT_AMBULATORY_CARE_PROVIDER_SITE_OTHER): Payer: Medicare HMO | Admitting: Cardiovascular Disease

## 2014-12-28 VITALS — BP 130/80 | HR 76 | Ht 74.0 in | Wt 226.6 lb

## 2014-12-28 DIAGNOSIS — I10 Essential (primary) hypertension: Secondary | ICD-10-CM | POA: Diagnosis not present

## 2014-12-28 DIAGNOSIS — I482 Chronic atrial fibrillation, unspecified: Secondary | ICD-10-CM

## 2014-12-28 NOTE — Patient Instructions (Signed)
Your physician wants you to follow-up in: 1 year with Dr Koneswaran You will receive a reminder letter in the mail two months in advance. If you don't receive a letter, please call our office to schedule the follow-up appointment.    Your physician recommends that you continue on your current medications as directed. Please refer to the Current Medication list given to you today.     Thank you for choosing [facility] !        

## 2014-12-28 NOTE — Progress Notes (Signed)
Patient ID: [name], male   DOB: Jul 11, 1941, 74 y.o.   MRN: [id number]      SUBJECTIVE: The patient is a 74 year old male with a history of permanent atrial fibrillation, hypertension, and hyperlipidemia. He is maintained on warfarin for anticoagulation therapy which is monitored by his PCP. The patient denies any symptoms of chest pain, palpitations, shortness of breath, lightheadedness, dizziness, leg swelling, orthopnea, PND, and syncope.  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  ECG performed in the office today demonstrates atrial fibrillation with PVCs and a nonspecific ST segment and T-wave abnormality, heart rate 99 bpm.   Review of Systems: As per "subjective", otherwise negative.  Allergies  Allergen Reactions  . Fluoride Preparations     Current Outpatient Prescriptions  Medication Sig Dispense Refill  . acetaminophen (TYLENOL) 500 MG tablet Take 500 mg by mouth every 6 (six) hours as needed.      Marland Kitchen allopurinol (ZYLOPRIM) 300 MG tablet Take 300 mg by mouth daily.      Marland Kitchen ascorbic acid (VITAMIN C) 250 MG CHEW Chew 500 mg by mouth daily.    . calcium-vitamin D 250-100 MG-UNIT per tablet Take 1 tablet by mouth 2 (two) times daily.    . captopril (CAPOTEN) 100 MG tablet TAKE ONE-HALF TABLET BY MOUTH TWICE DAILY 30 tablet 1  . chlorthalidone (HYGROTON) 25 MG tablet TAKE ONE TABLET BY MOUTH EVERY DAY 90 tablet 3  . diltiazem (CARDIZEM) 60 MG tablet Take 1 tablet (60 mg total) by mouth 3 (three) times daily. 90 tablet 6  . guaifenesin (WAL-TUSSIN) 100 MG/5ML syrup Take 200 mg by mouth 3 (three) times daily as needed.      . lovastatin (MEVACOR) 40 MG tablet TAKE ONE TABLET BY MOUTH EVERY DAY AT BEDTIME 30 tablet 3  . metFORMIN (GLUCOPHAGE) 500 MG tablet Take 500 mg by mouth 2 (two) times daily.      . metoprolol (LOPRESSOR) 100 MG tablet TAKE ONE-HALF TABLET BY MOUTH TWICE DAILY 90 tablet 6  . NON FORMULARY Take 3  tablets by mouth daily. MANGA-CAL    . vitamin E 400 UNIT capsule Take 1,000 Units by mouth daily.     Marland Kitchen warfarin (COUMADIN) 5 MG tablet Take 5 mg by mouth daily. Or as directed by anticoagulation clinic     . zinc gluconate 50 MG tablet Take 50 mg by mouth daily. Takes 1/2 tablet daily    . oxybutynin (DITROPAN) 5 MG tablet      No current facility-administered medications for this visit.    PMH: Chronic atrial fibrillation Essential HTN Gout Prostate CA Type 2 diabetes  Past Surgical History  Procedure Laterality Date  . Knee surgery      left knee  . Prostate biopsy      History   Social History  . Marital Status: Married    Spouse Name: N/A  . Number of Children: N/A  . Years of Education: N/A   Occupational History  . Not on file.   Social History Main Topics  . Smoking status: Never Smoker   . Smokeless tobacco: Never Used  . Alcohol Use: No  . Drug Use: No  . Sexual Activity: Not on file   Other Topics Concern  . Not on file   Social History Narrative     Filed Vitals:   12/28/14 0913  BP: 130/80  Pulse: 76  Height: 6\' 2"  (1.88 m)  Weight: 226 lb  9.6 oz (102.785 kg)  SpO2: 95%    PHYSICAL EXAM General: NAD Neck: No JVD, no thyromegaly. Lungs: Clear to auscultation bilaterally with normal respiratory effort. CV: Nondisplaced PMI. Regular rate and irregular rhythm, normal S1/S2, no S3, no murmur. No pretibial or periankle edema. No carotid bruit. Normal pedal pulses.  Abdomen: Soft, nontender, no hepatosplenomegaly, no distention.  Neurologic: Alert and oriented x 3.  Psych: Normal affect. Skin: Normal. Musculoskeletal: Normal range of motion, no gross deformities. Extremities: No clubbing or cyanosis.   ECG: Most recent ECG reviewed.      ASSESSMENT AND PLAN: 1. Permanent atrial fibrillation: Symptomatically stable with good rate control. Will not making any changes to current diltiazem regimen, 120 mg bid. Continue warfarin for  anticoagulation. 2. Essential HTN: Controlled on present therapy. No changes.  Dispo: f/u 1 year.   Kate Sable, M.D., F.A.C.C.

## 2015-02-05 ENCOUNTER — Ambulatory Visit (HOSPITAL_COMMUNITY)
Admission: RE | Admit: 2015-02-05 | Discharge: 2015-02-05 | Disposition: A | Discharge status: Home / Self Care | Payer: Medicare HMO | Source: Ambulatory Visit | Attending: Urology | Admitting: Urology

## 2015-02-05 DIAGNOSIS — R911 Solitary pulmonary nodule: Secondary | ICD-10-CM | POA: Diagnosis not present

## 2015-02-05 LAB — POCT I-STAT CREATININE: Creatinine, Ser: 1.3 mg/dL — ABNORMAL HIGH (ref 0.61–1.24)

## 2015-02-05 MED ORDER — IOHEXOL 300 MG/ML  SOLN
100.0000 mL | Freq: Once | INTRAMUSCULAR | Status: AC | PRN
  Administered 2015-02-05: 80 mL via INTRAVENOUS

## 2015-03-26 ENCOUNTER — Ambulatory Visit (HOSPITAL_COMMUNITY)
Admission: RE | Admit: 2015-03-26 | Discharge: 2015-03-26 | Disposition: A | Discharge status: Home / Self Care | Payer: Medicare HMO | Source: Ambulatory Visit | Attending: Family Medicine | Admitting: Family Medicine

## 2015-03-26 DIAGNOSIS — R609 Edema, unspecified: Secondary | ICD-10-CM | POA: Insufficient documentation

## 2015-03-26 DIAGNOSIS — R0600 Dyspnea, unspecified: Secondary | ICD-10-CM

## 2015-03-26 DIAGNOSIS — I071 Rheumatic tricuspid insufficiency: Secondary | ICD-10-CM | POA: Diagnosis not present

## 2015-04-22 ENCOUNTER — Ambulatory Visit (INDEPENDENT_AMBULATORY_CARE_PROVIDER_SITE_OTHER): Payer: Medicare HMO | Admitting: Cardiovascular Disease

## 2015-04-22 ENCOUNTER — Encounter: Payer: Self-pay | Admitting: Cardiovascular Disease

## 2015-04-22 VITALS — BP 122/78 | HR 106 | Ht 74.0 in | Wt 204.0 lb

## 2015-04-22 DIAGNOSIS — I5022 Chronic systolic (congestive) heart failure: Secondary | ICD-10-CM

## 2015-04-22 DIAGNOSIS — I482 Chronic atrial fibrillation, unspecified: Secondary | ICD-10-CM

## 2015-04-22 DIAGNOSIS — I429 Cardiomyopathy, unspecified: Secondary | ICD-10-CM

## 2015-04-22 DIAGNOSIS — I10 Essential (primary) hypertension: Secondary | ICD-10-CM

## 2015-04-22 DIAGNOSIS — Z7901 Long term (current) use of anticoagulants: Secondary | ICD-10-CM

## 2015-04-22 DIAGNOSIS — R000 Tachycardia, unspecified: Secondary | ICD-10-CM

## 2015-04-22 MED ORDER — DILTIAZEM HCL 120 MG PO TABS: ORAL_TABLET | ORAL | Status: DC

## 2015-04-22 MED ORDER — POTASSIUM CHLORIDE CRYS ER 20 MEQ PO TBCR
20.0000 meq | EXTENDED_RELEASE_TABLET | Freq: Every day | ORAL | Status: DC
Start: 2015-04-22 — End: 2015-12-04

## 2015-04-22 NOTE — Progress Notes (Signed)
Patient ID: [name], male   DOB: 26-Jan-1941, 74 y.o.   MRN: [id number]      SUBJECTIVE: The patient presents for follow up. He has a history of chronic atrial fibrillation, hypertension, and hyperlipidemia. He is maintained on warfarin for anticoagulation therapy which is monitored by his PCP.  He was recently evaluated by his PCP for leg swelling (only left leg) deemed secondary to congestive heart failure and cellulitis. He was started on Lasix and potassium along with clarithromycin. He denied shortness of breath at that time. Labs on 7/14 demonstrated sodium 139, potassium 4.5, chloride 101, bicarbonate 26, BUN 32, creatinine 1.62.  Echocardiogram ordered by his PCP on 03/26/15 demonstrated mild to moderately reduced left ventricle systolic function, LVEF 86-76%, diffuse hypokinesis, severe left atrial dilatation, and mild tricuspid regurgitation.  Wt 226 lbs on 12/28/14. Wt 204 today.  He is feeling much better and denies chest pain, shortness of breath, palpitations, and leg swelling. He has diabetes.  Soc: Married. 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   Review of Systems: As per "subjective", otherwise negative.  Allergies  Allergen Reactions  . Fluoride Preparations     Current Outpatient Prescriptions  Medication Sig Dispense Refill  . acetaminophen (TYLENOL) 500 MG tablet Take 500 mg by mouth every 6 (six) hours as needed.      Marland Kitchen allopurinol (ZYLOPRIM) 300 MG tablet Take 300 mg by mouth daily.      Marland Kitchen ascorbic acid (VITAMIN C) 250 MG CHEW Chew 500 mg by mouth daily.    . calcium-vitamin D 250-100 MG-UNIT per tablet Take 1 tablet by mouth 2 (two) times daily.    . captopril (CAPOTEN) 100 MG tablet TAKE ONE-HALF TABLET BY MOUTH TWICE DAILY 30 tablet 1  . diltiazem (CARDIZEM) 60 MG tablet Take 60 mg by mouth 2 (two) times daily. 2 tabs (total 120 mg) twice a day    . furosemide (LASIX) 20 MG tablet Take 20 mg by  mouth daily.    Marland Kitchen guaifenesin (WAL-TUSSIN) 100 MG/5ML syrup Take 200 mg by mouth 3 (three) times daily as needed.      . lovastatin (MEVACOR) 40 MG tablet TAKE ONE TABLET BY MOUTH EVERY DAY AT BEDTIME 30 tablet 3  . metFORMIN (GLUCOPHAGE) 500 MG tablet Take 500 mg by mouth 2 (two) times daily.      . metoprolol (LOPRESSOR) 100 MG tablet TAKE ONE-HALF TABLET BY MOUTH TWICE DAILY 90 tablet 6  . NON FORMULARY Take 3 tablets by mouth daily. MANGA-CAL    . potassium chloride SA (K-DUR,KLOR-CON) 20 MEQ tablet Take 20 mEq by mouth 2 (two) times daily.    . vitamin E 400 UNIT capsule Take 1,000 Units by mouth daily.     Marland Kitchen warfarin (COUMADIN) 5 MG tablet Take 5 mg by mouth daily. Or as directed by anticoagulation clinic     . zinc gluconate 50 MG tablet Take 50 mg by mouth daily. Takes 1/2 tablet daily     No current facility-administered medications for this visit.    Past Medical History  Diagnosis Date  . Chronic atrial fibrillation   . Hypertension   . Hyperlipidemia   . Diabetes mellitus, type II     no insulin  . Chronic bronchitis   . History of herpes zoster virus   . Gout   . Cancer   . Prostate cancer 2014    EBRT + hormonal therapy  . History of  radiation therapy 12/21/12- 02/15/13    prostate 69 gray in 40 fx    Past Surgical History  Procedure Laterality Date  . Knee surgery      left knee  . Prostate biopsy      History   Social History  . Marital Status: Married    Spouse Name: N/A  . Number of Children: N/A  . Years of Education: N/A   Occupational History  . Not on file.   Social History Main Topics  . Smoking status: Never Smoker   . Smokeless tobacco: Never Used  . Alcohol Use: No  . Drug Use: No  . Sexual Activity: Not on file   Other Topics Concern  . Not on file   Social History Narrative     Filed Vitals:   04/22/15 1255  BP: 122/78  Pulse: 106  Height: 6\' 2"  (1.88 m)  Weight: 204 lb (92.534 kg)  SpO2: 99%    PHYSICAL EXAM General:  NAD Neck: No JVD, no thyromegaly. Lungs: Clear to auscultation bilaterally with normal respiratory effort. CV: Tachycardic, irregular rhythm, normal S1/S2, no S3, no murmur. No pretibial or periankle edema.Left leg bandaged.   Abdomen: Soft, nontender, no distention.  Neurologic: Alert and oriented x 3.  Psych: Normal affect. Skin: Normal. Musculoskeletal: No gross deformities. Extremities: No clubbing or cyanosis.   ECG: Most recent ECG reviewed.      ASSESSMENT AND PLAN: 1. Permanent atrial fibrillation: Tachycardic, which may have led to heart failure. Unilateral leg swelling argues against heart failure and was more likely due to cellulitis, particularly given history of diabetes. Increase diltiazem to 240 mg q am and 120 mg q pm. Continue warfarin for anticoagulation.  2. Essential HTN: Controlled on present therapy. Monitor given change in diltiazem dose.  3. Chronic systolic heart failure/cardiomyopathy: Weight down 22 lbs since 12/28/14. Uncertain etiology, no prior echocardiogram on file. Unilateral leg swelling argues against heart failure and was more likely due to cellulitis, particularly given history of diabetes. May consider stress test to evaluate for ischemic etiology, given PVC's and nonspecific ST-T abnormalities seen on prior ECG (pt prefers deferring). May be tachycardia mediated as well. Will increase diltiazem as mentioned above.  Dispo: f/u 6-8 weeks.  Time spent: 40 minutes, of which greater than 50% was spent reviewing symptoms, relevant blood tests and studies, and discussing management plan with the patient.   Kate Sable, M.D., F.A.C.C.

## 2015-04-22 NOTE — Patient Instructions (Signed)
   Increase Diltiazem to 240mg  every morning & continue same (120mg ) every evening - new sent to pharmacy today. Continue all other medications.   Follow up in  6-8 weeks

## 2015-04-22 NOTE — Addendum Note (Signed)
Addended by: Laurine Blazer on: 04/22/2015 01:26 PM   Modules accepted: Orders

## 2015-04-26 ENCOUNTER — Ambulatory Visit (INDEPENDENT_AMBULATORY_CARE_PROVIDER_SITE_OTHER): Payer: Medicare HMO | Admitting: Urology

## 2015-04-26 DIAGNOSIS — N3941 Urge incontinence: Secondary | ICD-10-CM | POA: Diagnosis not present

## 2015-04-26 DIAGNOSIS — C61 Malignant neoplasm of prostate: Secondary | ICD-10-CM | POA: Diagnosis not present

## 2015-04-26 DIAGNOSIS — N402 Nodular prostate without lower urinary tract symptoms: Secondary | ICD-10-CM | POA: Diagnosis not present

## 2015-05-21 ENCOUNTER — Encounter (HOSPITAL_BASED_OUTPATIENT_CLINIC_OR_DEPARTMENT_OTHER): Payer: Medicare HMO | Attending: General Surgery

## 2015-05-21 DIAGNOSIS — I10 Essential (primary) hypertension: Secondary | ICD-10-CM | POA: Insufficient documentation

## 2015-05-21 DIAGNOSIS — L97821 Non-pressure chronic ulcer of other part of left lower leg limited to breakdown of skin: Secondary | ICD-10-CM | POA: Insufficient documentation

## 2015-05-21 DIAGNOSIS — Z923 Personal history of irradiation: Secondary | ICD-10-CM | POA: Insufficient documentation

## 2015-05-21 DIAGNOSIS — E1151 Type 2 diabetes mellitus with diabetic peripheral angiopathy without gangrene: Secondary | ICD-10-CM | POA: Insufficient documentation

## 2015-05-21 DIAGNOSIS — M109 Gout, unspecified: Secondary | ICD-10-CM | POA: Diagnosis not present

## 2015-05-21 DIAGNOSIS — I482 Chronic atrial fibrillation: Secondary | ICD-10-CM | POA: Insufficient documentation

## 2015-05-21 DIAGNOSIS — Z8546 Personal history of malignant neoplasm of prostate: Secondary | ICD-10-CM | POA: Diagnosis not present

## 2015-05-21 DIAGNOSIS — I87312 Chronic venous hypertension (idiopathic) with ulcer of left lower extremity: Secondary | ICD-10-CM | POA: Diagnosis not present

## 2015-05-21 DIAGNOSIS — I509 Heart failure, unspecified: Secondary | ICD-10-CM | POA: Diagnosis not present

## 2015-05-21 DIAGNOSIS — E1165 Type 2 diabetes mellitus with hyperglycemia: Secondary | ICD-10-CM | POA: Insufficient documentation

## 2015-05-21 DIAGNOSIS — Z7901 Long term (current) use of anticoagulants: Secondary | ICD-10-CM | POA: Diagnosis not present

## 2015-05-21 DIAGNOSIS — E1136 Type 2 diabetes mellitus with diabetic cataract: Secondary | ICD-10-CM | POA: Insufficient documentation

## 2015-05-28 DIAGNOSIS — L97821 Non-pressure chronic ulcer of other part of left lower leg limited to breakdown of skin: Secondary | ICD-10-CM | POA: Diagnosis not present

## 2015-05-28 DIAGNOSIS — I482 Chronic atrial fibrillation: Secondary | ICD-10-CM | POA: Diagnosis not present

## 2015-05-28 DIAGNOSIS — Z7901 Long term (current) use of anticoagulants: Secondary | ICD-10-CM | POA: Diagnosis not present

## 2015-05-28 DIAGNOSIS — I87312 Chronic venous hypertension (idiopathic) with ulcer of left lower extremity: Secondary | ICD-10-CM | POA: Diagnosis not present

## 2015-05-29 ENCOUNTER — Encounter: Payer: Self-pay | Admitting: Cardiovascular Disease

## 2015-05-29 ENCOUNTER — Ambulatory Visit (INDEPENDENT_AMBULATORY_CARE_PROVIDER_SITE_OTHER): Payer: Medicare HMO | Admitting: Cardiovascular Disease

## 2015-05-29 VITALS — BP 124/74 | HR 58 | Ht 74.5 in | Wt 217.1 lb

## 2015-05-29 DIAGNOSIS — Z7901 Long term (current) use of anticoagulants: Secondary | ICD-10-CM | POA: Diagnosis not present

## 2015-05-29 DIAGNOSIS — I481 Persistent atrial fibrillation: Secondary | ICD-10-CM | POA: Diagnosis not present

## 2015-05-29 DIAGNOSIS — I5022 Chronic systolic (congestive) heart failure: Secondary | ICD-10-CM | POA: Diagnosis not present

## 2015-05-29 DIAGNOSIS — I429 Cardiomyopathy, unspecified: Secondary | ICD-10-CM

## 2015-05-29 DIAGNOSIS — I4819 Other persistent atrial fibrillation: Secondary | ICD-10-CM

## 2015-05-29 DIAGNOSIS — I10 Essential (primary) hypertension: Secondary | ICD-10-CM

## 2015-05-29 NOTE — Patient Instructions (Signed)
Your physician wants you to follow-up in: 6 months in [place] with Dr Virgina Jock will receive a reminder letter in the mail two months in advance. If you don't receive a letter, please call our office to schedule the follow-up appointment.    Your physician recommends that you continue on your current medications as directed. Please refer to the Current Medication list given to you today.     Thank you for choosing [facility] !

## 2015-05-29 NOTE — Progress Notes (Signed)
Patient ID: [name], male   DOB: August 05, 1941, 74 y.o.   MRN: [id number]      SUBJECTIVE: The patient presents for follow up. He has a history of chronic atrial fibrillation, chronic systolic heart failure, hypertension, and hyperlipidemia. He is maintained on warfarin for anticoagulation therapy which is monitored by his PCP.  Echocardiogram ordered by his PCP on 03/26/15 demonstrated mild to moderately reduced left ventricle systolic function, LVEF 42-68%, diffuse hypokinesis, severe left atrial dilatation, and mild tricuspid regurgitation.  Wt 226 lbs on 12/28/14. Wt 204 lbs on 04/21/14 (when appetite was markedly reduced while he was sick). Wt 217 lbs today.  He is feeling much better and denies chest pain, shortness of breath, palpitations, and leg swelling. He has diabetes. Appetite has improved. Cellulitis and left leg wound is healing well.  Soc: Married. 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   Review of Systems: As per "subjective", otherwise negative.  Allergies  Allergen Reactions  . Clarithromycin Other (See Comments)    "aches & pains all over", no appetite, sleepy  . Fluoride Preparations     Current Outpatient Prescriptions  Medication Sig Dispense Refill  . acetaminophen (TYLENOL) 500 MG tablet Take 500 mg by mouth every 6 (six) hours as needed.      Marland Kitchen allopurinol (ZYLOPRIM) 300 MG tablet Take 300 mg by mouth daily.      Marland Kitchen ascorbic acid (VITAMIN C) 250 MG CHEW Chew 500 mg by mouth daily.    . calcium-vitamin D 250-100 MG-UNIT per tablet Take 1 tablet by mouth 2 (two) times daily.    . captopril (CAPOTEN) 100 MG tablet TAKE ONE-HALF TABLET BY MOUTH TWICE DAILY 30 tablet 1  . diltiazem (CARDIZEM) 120 MG tablet Take 2 tabs (240mg ) by mouth every morning & 1 tab (120mg ) every evening 90 tablet 6  . furosemide (LASIX) 20 MG tablet Take 20 mg by mouth daily.    Marland Kitchen guaifenesin (WAL-TUSSIN) 100 MG/5ML syrup Take  200 mg by mouth 3 (three) times daily as needed.      . lovastatin (MEVACOR) 40 MG tablet TAKE ONE TABLET BY MOUTH EVERY DAY AT BEDTIME 30 tablet 3  . metFORMIN (GLUCOPHAGE) 500 MG tablet Take 500 mg by mouth 3 (three) times daily.     . metoprolol (LOPRESSOR) 100 MG tablet TAKE ONE-HALF TABLET BY MOUTH TWICE DAILY 90 tablet 6  . NON FORMULARY Take 3 tablets by mouth daily. MANGA-CAL    . potassium chloride SA (K-DUR,KLOR-CON) 20 MEQ tablet Take 1 tablet (20 mEq total) by mouth daily.    . vitamin E 400 UNIT capsule Take 1,000 Units by mouth daily.     Marland Kitchen warfarin (COUMADIN) 5 MG tablet Take 5 mg by mouth daily. Or as directed by anticoagulation clinic     . zinc gluconate 50 MG tablet Take 50 mg by mouth daily. Takes 1/2 tablet daily     No current facility-administered medications for this visit.    Past Medical History  Diagnosis Date  . Chronic atrial fibrillation   . Hypertension   . Hyperlipidemia   . Diabetes mellitus, type II     no insulin  . Chronic bronchitis   . History of herpes zoster virus   . Gout   . Cancer   . Prostate cancer 2014    EBRT + hormonal therapy  . History of radiation therapy 12/21/12- 02/15/13    prostate 78 gray in 40  fx    Past Surgical History  Procedure Laterality Date  . Knee surgery      left knee  . Prostate biopsy      Social History   Social History  . Marital Status: Married    Spouse Name: N/A  . Number of Children: N/A  . Years of Education: N/A   Occupational History  . Not on file.   Social History Main Topics  . Smoking status: Never Smoker   . Smokeless tobacco: Never Used  . Alcohol Use: No  . Drug Use: No  . Sexual Activity: Not on file   Other Topics Concern  . Not on file   Social History Narrative     Filed Vitals:   05/29/15 1424  BP: 124/74  Pulse: 58  Height: 6' 2.5" (1.892 m)  Weight: 217 lb 1.9 oz (98.485 kg)  SpO2: 98%    PHYSICAL EXAM General: NAD Neck: No JVD, no thyromegaly. Lungs:  Clear to auscultation bilaterally with normal respiratory effort. CV: Regular rate and rhythm, normal S1/S2, no S3, no murmur. Trivial left pretibial  edema.Left leg bandaged.  Abdomen: Soft, nontender, no distention.  Neurologic: Alert and oriented x 3.  Psych: Normal affect. Skin: Normal. Musculoskeletal: No gross deformities. Extremities: No clubbing or cyanosis.   ECG: Most recent ECG reviewed.      ASSESSMENT AND PLAN: 1. Permanent atrial fibrillation: Rate controlled and in a regular rhythm on current dose of diltiazem. No changes.. Continue warfarin for anticoagulation.  2. Essential HTN: Controlled on present therapy. No changes.  3. Chronic systolic heart failure/cardiomyopathy: Weight up 13 lbs since 04/22/15, likely due to much improved appetite. Not decompensated by exam nor symptoms.  No changes to diuretic regimen. May consider stress test to evaluate for ischemic etiology in the future, but may have been tachycardia-mediated.    Dispo: f/u 6 months.  Kate Sable, M.D., F.A.C.C.

## 2015-06-04 ENCOUNTER — Encounter (HOSPITAL_BASED_OUTPATIENT_CLINIC_OR_DEPARTMENT_OTHER): Payer: Medicare HMO | Attending: General Surgery

## 2015-06-04 DIAGNOSIS — L97221 Non-pressure chronic ulcer of left calf limited to breakdown of skin: Secondary | ICD-10-CM | POA: Insufficient documentation

## 2015-06-04 DIAGNOSIS — I482 Chronic atrial fibrillation: Secondary | ICD-10-CM | POA: Diagnosis not present

## 2015-06-04 DIAGNOSIS — I10 Essential (primary) hypertension: Secondary | ICD-10-CM | POA: Insufficient documentation

## 2015-06-04 DIAGNOSIS — I509 Heart failure, unspecified: Secondary | ICD-10-CM | POA: Insufficient documentation

## 2015-06-04 DIAGNOSIS — M109 Gout, unspecified: Secondary | ICD-10-CM | POA: Insufficient documentation

## 2015-06-04 DIAGNOSIS — E1159 Type 2 diabetes mellitus with other circulatory complications: Secondary | ICD-10-CM | POA: Insufficient documentation

## 2015-06-04 DIAGNOSIS — Z7901 Long term (current) use of anticoagulants: Secondary | ICD-10-CM | POA: Diagnosis not present

## 2015-06-04 DIAGNOSIS — I739 Peripheral vascular disease, unspecified: Secondary | ICD-10-CM | POA: Insufficient documentation

## 2015-06-05 ENCOUNTER — Ambulatory Visit: Payer: Medicare HMO | Admitting: Cardiovascular Disease

## 2015-06-11 ENCOUNTER — Other Ambulatory Visit (HOSPITAL_BASED_OUTPATIENT_CLINIC_OR_DEPARTMENT_OTHER): Payer: Self-pay | Admitting: General Surgery

## 2015-06-11 ENCOUNTER — Ambulatory Visit (HOSPITAL_COMMUNITY)
Admission: RE | Admit: 2015-06-11 | Discharge: 2015-06-11 | Disposition: A | Discharge status: Home / Self Care | Payer: Medicare HMO | Source: Ambulatory Visit | Attending: Vascular Surgery | Admitting: Vascular Surgery

## 2015-06-11 ENCOUNTER — Ambulatory Visit (INDEPENDENT_AMBULATORY_CARE_PROVIDER_SITE_OTHER)
Admission: RE | Admit: 2015-06-11 | Discharge: 2015-06-11 | Disposition: A | Discharge status: Home / Self Care | Payer: Medicare HMO | Source: Ambulatory Visit | Attending: Vascular Surgery | Admitting: Vascular Surgery

## 2015-06-11 DIAGNOSIS — I509 Heart failure, unspecified: Secondary | ICD-10-CM | POA: Diagnosis not present

## 2015-06-11 DIAGNOSIS — L97929 Non-pressure chronic ulcer of unspecified part of left lower leg with unspecified severity: Secondary | ICD-10-CM | POA: Diagnosis not present

## 2015-06-11 DIAGNOSIS — I10 Essential (primary) hypertension: Secondary | ICD-10-CM | POA: Diagnosis not present

## 2015-06-11 DIAGNOSIS — E1159 Type 2 diabetes mellitus with other circulatory complications: Secondary | ICD-10-CM | POA: Diagnosis not present

## 2015-06-11 DIAGNOSIS — L97221 Non-pressure chronic ulcer of left calf limited to breakdown of skin: Secondary | ICD-10-CM | POA: Diagnosis not present

## 2015-06-18 DIAGNOSIS — I509 Heart failure, unspecified: Secondary | ICD-10-CM | POA: Diagnosis not present

## 2015-06-18 DIAGNOSIS — E1159 Type 2 diabetes mellitus with other circulatory complications: Secondary | ICD-10-CM | POA: Diagnosis not present

## 2015-06-18 DIAGNOSIS — I10 Essential (primary) hypertension: Secondary | ICD-10-CM | POA: Diagnosis not present

## 2015-06-18 DIAGNOSIS — L97221 Non-pressure chronic ulcer of left calf limited to breakdown of skin: Secondary | ICD-10-CM | POA: Diagnosis not present

## 2015-06-25 DIAGNOSIS — E1159 Type 2 diabetes mellitus with other circulatory complications: Secondary | ICD-10-CM | POA: Diagnosis not present

## 2015-06-25 DIAGNOSIS — I10 Essential (primary) hypertension: Secondary | ICD-10-CM | POA: Diagnosis not present

## 2015-06-25 DIAGNOSIS — I509 Heart failure, unspecified: Secondary | ICD-10-CM | POA: Diagnosis not present

## 2015-06-25 DIAGNOSIS — L97221 Non-pressure chronic ulcer of left calf limited to breakdown of skin: Secondary | ICD-10-CM | POA: Diagnosis not present

## 2015-07-02 ENCOUNTER — Encounter (HOSPITAL_BASED_OUTPATIENT_CLINIC_OR_DEPARTMENT_OTHER): Payer: Medicare HMO | Attending: General Surgery

## 2015-07-02 DIAGNOSIS — Z7984 Long term (current) use of oral hypoglycemic drugs: Secondary | ICD-10-CM | POA: Diagnosis not present

## 2015-07-02 DIAGNOSIS — Z7901 Long term (current) use of anticoagulants: Secondary | ICD-10-CM | POA: Insufficient documentation

## 2015-07-02 DIAGNOSIS — I509 Heart failure, unspecified: Secondary | ICD-10-CM | POA: Diagnosis not present

## 2015-07-02 DIAGNOSIS — I110 Hypertensive heart disease with heart failure: Secondary | ICD-10-CM | POA: Insufficient documentation

## 2015-07-02 DIAGNOSIS — E11622 Type 2 diabetes mellitus with other skin ulcer: Secondary | ICD-10-CM | POA: Diagnosis not present

## 2015-07-02 DIAGNOSIS — M109 Gout, unspecified: Secondary | ICD-10-CM | POA: Diagnosis not present

## 2015-07-02 DIAGNOSIS — I872 Venous insufficiency (chronic) (peripheral): Secondary | ICD-10-CM | POA: Insufficient documentation

## 2015-07-02 DIAGNOSIS — Z923 Personal history of irradiation: Secondary | ICD-10-CM | POA: Diagnosis not present

## 2015-07-02 DIAGNOSIS — L97821 Non-pressure chronic ulcer of other part of left lower leg limited to breakdown of skin: Secondary | ICD-10-CM | POA: Insufficient documentation

## 2015-07-02 DIAGNOSIS — I482 Chronic atrial fibrillation: Secondary | ICD-10-CM | POA: Insufficient documentation

## 2015-07-09 DIAGNOSIS — I482 Chronic atrial fibrillation: Secondary | ICD-10-CM | POA: Diagnosis not present

## 2015-07-09 DIAGNOSIS — L97821 Non-pressure chronic ulcer of other part of left lower leg limited to breakdown of skin: Secondary | ICD-10-CM | POA: Diagnosis not present

## 2015-07-09 DIAGNOSIS — I509 Heart failure, unspecified: Secondary | ICD-10-CM | POA: Diagnosis not present

## 2015-07-09 DIAGNOSIS — Z7901 Long term (current) use of anticoagulants: Secondary | ICD-10-CM | POA: Diagnosis not present

## 2015-07-16 DIAGNOSIS — I482 Chronic atrial fibrillation: Secondary | ICD-10-CM | POA: Diagnosis not present

## 2015-07-16 DIAGNOSIS — Z7901 Long term (current) use of anticoagulants: Secondary | ICD-10-CM | POA: Diagnosis not present

## 2015-07-16 DIAGNOSIS — I509 Heart failure, unspecified: Secondary | ICD-10-CM | POA: Diagnosis not present

## 2015-07-16 DIAGNOSIS — L97821 Non-pressure chronic ulcer of other part of left lower leg limited to breakdown of skin: Secondary | ICD-10-CM | POA: Diagnosis not present

## 2015-07-23 DIAGNOSIS — I509 Heart failure, unspecified: Secondary | ICD-10-CM | POA: Diagnosis not present

## 2015-07-23 DIAGNOSIS — I482 Chronic atrial fibrillation: Secondary | ICD-10-CM | POA: Diagnosis not present

## 2015-07-23 DIAGNOSIS — Z7901 Long term (current) use of anticoagulants: Secondary | ICD-10-CM | POA: Diagnosis not present

## 2015-07-23 DIAGNOSIS — L97821 Non-pressure chronic ulcer of other part of left lower leg limited to breakdown of skin: Secondary | ICD-10-CM | POA: Diagnosis not present

## 2015-09-18 DIAGNOSIS — E119 Type 2 diabetes mellitus without complications: Secondary | ICD-10-CM | POA: Diagnosis not present

## 2015-09-18 DIAGNOSIS — C61 Malignant neoplasm of prostate: Secondary | ICD-10-CM | POA: Diagnosis not present

## 2015-09-18 DIAGNOSIS — E785 Hyperlipidemia, unspecified: Secondary | ICD-10-CM | POA: Diagnosis not present

## 2015-09-18 DIAGNOSIS — I4891 Unspecified atrial fibrillation: Secondary | ICD-10-CM | POA: Diagnosis not present

## 2015-09-18 DIAGNOSIS — I10 Essential (primary) hypertension: Secondary | ICD-10-CM | POA: Diagnosis not present

## 2015-09-18 DIAGNOSIS — E559 Vitamin D deficiency, unspecified: Secondary | ICD-10-CM | POA: Diagnosis not present

## 2015-09-24 DIAGNOSIS — J209 Acute bronchitis, unspecified: Secondary | ICD-10-CM | POA: Diagnosis not present

## 2015-10-01 DIAGNOSIS — M1000 Idiopathic gout, unspecified site: Secondary | ICD-10-CM | POA: Diagnosis not present

## 2015-10-16 DIAGNOSIS — E559 Vitamin D deficiency, unspecified: Secondary | ICD-10-CM | POA: Diagnosis not present

## 2015-10-16 DIAGNOSIS — I4891 Unspecified atrial fibrillation: Secondary | ICD-10-CM | POA: Diagnosis not present

## 2015-10-16 DIAGNOSIS — I10 Essential (primary) hypertension: Secondary | ICD-10-CM | POA: Diagnosis not present

## 2015-10-16 DIAGNOSIS — E119 Type 2 diabetes mellitus without complications: Secondary | ICD-10-CM | POA: Diagnosis not present

## 2015-10-18 ENCOUNTER — Ambulatory Visit (INDEPENDENT_AMBULATORY_CARE_PROVIDER_SITE_OTHER): Payer: Commercial Managed Care - HMO | Admitting: Urology

## 2015-10-18 DIAGNOSIS — N402 Nodular prostate without lower urinary tract symptoms: Secondary | ICD-10-CM | POA: Diagnosis not present

## 2015-10-18 DIAGNOSIS — C61 Malignant neoplasm of prostate: Secondary | ICD-10-CM

## 2015-10-18 DIAGNOSIS — N3941 Urge incontinence: Secondary | ICD-10-CM | POA: Diagnosis not present

## 2015-10-18 DIAGNOSIS — R972 Elevated prostate specific antigen [PSA]: Secondary | ICD-10-CM | POA: Diagnosis not present

## 2015-11-20 DIAGNOSIS — E559 Vitamin D deficiency, unspecified: Secondary | ICD-10-CM | POA: Diagnosis not present

## 2015-11-20 DIAGNOSIS — I4891 Unspecified atrial fibrillation: Secondary | ICD-10-CM | POA: Diagnosis not present

## 2015-11-20 DIAGNOSIS — E119 Type 2 diabetes mellitus without complications: Secondary | ICD-10-CM | POA: Diagnosis not present

## 2015-11-20 DIAGNOSIS — I10 Essential (primary) hypertension: Secondary | ICD-10-CM | POA: Diagnosis not present

## 2015-12-02 DIAGNOSIS — M1000 Idiopathic gout, unspecified site: Secondary | ICD-10-CM | POA: Diagnosis not present

## 2015-12-04 ENCOUNTER — Ambulatory Visit (INDEPENDENT_AMBULATORY_CARE_PROVIDER_SITE_OTHER): Payer: Commercial Managed Care - HMO | Admitting: Cardiovascular Disease

## 2015-12-04 ENCOUNTER — Encounter: Payer: Self-pay | Admitting: Cardiovascular Disease

## 2015-12-04 VITALS — BP 118/68 | HR 46 | Ht 74.0 in | Wt 222.0 lb

## 2015-12-04 DIAGNOSIS — Z7901 Long term (current) use of anticoagulants: Secondary | ICD-10-CM

## 2015-12-04 DIAGNOSIS — I5022 Chronic systolic (congestive) heart failure: Secondary | ICD-10-CM

## 2015-12-04 DIAGNOSIS — I481 Persistent atrial fibrillation: Secondary | ICD-10-CM

## 2015-12-04 DIAGNOSIS — I429 Cardiomyopathy, unspecified: Secondary | ICD-10-CM | POA: Diagnosis not present

## 2015-12-04 DIAGNOSIS — I4819 Other persistent atrial fibrillation: Secondary | ICD-10-CM

## 2015-12-04 DIAGNOSIS — R001 Bradycardia, unspecified: Secondary | ICD-10-CM

## 2015-12-04 DIAGNOSIS — I10 Essential (primary) hypertension: Secondary | ICD-10-CM

## 2015-12-04 MED ORDER — DILTIAZEM HCL 120 MG PO TABS: ORAL_TABLET | ORAL | Status: DC

## 2015-12-04 NOTE — Patient Instructions (Signed)
Medication Instructions:  Decrease diltiazem to 120 mg two times daily   Labwork: none  Testing/Procedures: none  Follow-Up: Your physician wants you to follow-up in: 6 months .  You will receive a reminder letter in the mail two months in advance. If you don't receive a letter, please call our office to schedule the follow-up appointment.   Any Other Special Instructions Will Be Listed Below (If Applicable).     If you need a refill on your cardiac medications before your next appointment, please call your pharmacy.

## 2015-12-04 NOTE — Progress Notes (Signed)
Patient ID: [name], male   DOB: [birth year], 75 y.o.   MRN: [id number]      SUBJECTIVE: The patient presents for follow up. He has a history of chronic atrial fibrillation, chronic systolic heart failure, hypertension, and hyperlipidemia. He is maintained on warfarin for anticoagulation therapy which is monitored by his PCP.  Echocardiogram ordered by his PCP on 03/26/15 demonstrated mild to moderately reduced left ventricle systolic function, LVEF [id number], diffuse hypokinesis, severe left atrial dilatation, and mild tricuspid regurgitation.  The patient denies any symptoms of chest pain, palpitations, shortness of breath, lightheadedness, dizziness, leg swelling, orthopnea, PND, and syncope.   Soc: Married. 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   Review of Systems: As per "subjective", otherwise negative.  Allergies  Allergen Reactions  . Clarithromycin Other (See Comments)    "aches & pains all over", no appetite, sleepy  . Fluoride Preparations     Current Outpatient Prescriptions  Medication Sig Dispense Refill  . acetaminophen (TYLENOL) 500 MG tablet Take 500 mg by mouth every 6 (six) hours as needed.      Marland Kitchen allopurinol (ZYLOPRIM) 300 MG tablet Take 300 mg by mouth daily.      Marland Kitchen ascorbic acid (VITAMIN C) 250 MG CHEW Chew 500 mg by mouth daily.    . calcium-vitamin D 250-100 MG-UNIT per tablet Take 1 tablet by mouth 2 (two) times daily.    . captopril (CAPOTEN) 100 MG tablet TAKE ONE-HALF TABLET BY MOUTH TWICE DAILY 30 tablet 1  . diltiazem (CARDIZEM) 120 MG tablet Take 2 tabs (240mg ) by mouth every morning & 1 tab (120mg ) every evening 90 tablet 6  . furosemide (LASIX) 20 MG tablet Take 20 mg by mouth daily.    Marland Kitchen guaifenesin (WAL-TUSSIN) 100 MG/5ML syrup Take 200 mg by mouth 3 (three) times daily as needed.      . lovastatin (MEVACOR) 40 MG tablet TAKE ONE TABLET BY MOUTH EVERY DAY AT BEDTIME 30 tablet 3  .  metFORMIN (GLUCOPHAGE) 500 MG tablet Take 500 mg by mouth 3 (three) times daily.     . metoprolol (LOPRESSOR) 100 MG tablet TAKE ONE-HALF TABLET BY MOUTH TWICE DAILY 90 tablet 6  . NON FORMULARY Take 3 tablets by mouth daily. MANGA-CAL    . potassium chloride SA (K-DUR,KLOR-CON) 20 MEQ tablet Take 1 tablet (20 mEq total) by mouth daily.    . vitamin E 400 UNIT capsule Take 1,000 Units by mouth daily.     Marland Kitchen warfarin (COUMADIN) 5 MG tablet Take 5 mg by mouth daily. Or as directed by anticoagulation clinic     . zinc gluconate 50 MG tablet Take 50 mg by mouth daily. Takes 1/2 tablet daily     No current facility-administered medications for this visit.    Past Medical History  Diagnosis Date  . Chronic atrial fibrillation ([place])   . Hypertension   . Hyperlipidemia   . Diabetes mellitus, type II ([place])     no insulin  . Chronic bronchitis   . History of herpes zoster virus   . Gout   . Cancer ([place])   . Prostate cancer ([place]) 2014    EBRT + hormonal therapy  . History of radiation therapy 12/21/12- 02/15/13    prostate 66 gray in 40 fx    Past Surgical History  Procedure Laterality Date  . Knee surgery      left knee  . Prostate biopsy  Social History   Social History  . Marital Status: Married    Spouse Name: N/A  . Number of Children: N/A  . Years of Education: N/A   Occupational History  . Not on file.   Social History Main Topics  . Smoking status: Never Smoker   . Smokeless tobacco: Never Used  . Alcohol Use: No  . Drug Use: No  . Sexual Activity: Not on file   Other Topics Concern  . Not on file   Social History Narrative     Filed Vitals:   12/04/15 1257  BP: 118/68  Pulse: 46  Height: 6\' 2"  (1.88 m)  Weight: 222 lb (100.699 kg)  SpO2: 94%    PHYSICAL EXAM General: NAD HEENT: Normal. Neck: No JVD, no thyromegaly. Lungs: Clear to auscultation bilaterally with normal respiratory effort. CV: Bradycardic, regular rhythm, normal S1/S2, no S3, no  murmur. No pretibial or periankle edema.  Abdomen: Soft, nontender, no distention.  Neurologic: Alert and oriented.  Psych: Normal affect. Skin: Normal. Musculoskeletal: No gross deformities.  ECG: Most recent ECG reviewed.      ASSESSMENT AND PLAN: 1. Chronic atrial fibrillation: Currently bradycardic and in a regular rhythm on current dose of diltiazem. Will reduce to 120 mg bid. Continue warfarin for anticoagulation.  2. Essential HTN: Controlled on present therapy. No changes.  3. Chronic systolic heart failure/cardiomyopathy: Euvolemic. Stopped Lasix due to gout. May consider stress test to evaluate for ischemic etiology in the future, but may have been tachycardia-mediated.   Dispo: f/u 6 months.   Kate Sable, M.D., F.A.C.C.

## 2016-01-08 ENCOUNTER — Other Ambulatory Visit: Payer: Self-pay

## 2016-01-08 MED ORDER — DILTIAZEM HCL 120 MG PO TABS: ORAL_TABLET | ORAL | Status: DC

## 2016-01-08 NOTE — Telephone Encounter (Signed)
Refill to humans sent for diltiazem

## 2016-01-10 ENCOUNTER — Other Ambulatory Visit: Payer: Self-pay

## 2016-01-10 MED ORDER — DILTIAZEM HCL 120 MG PO TABS: ORAL_TABLET | ORAL | Status: DC

## 2016-01-21 ENCOUNTER — Other Ambulatory Visit: Payer: Self-pay | Admitting: *Deleted

## 2016-01-21 MED ORDER — DILTIAZEM HCL 120 MG PO TABS: ORAL_TABLET | ORAL | Status: DC

## 2016-02-19 DIAGNOSIS — M1000 Idiopathic gout, unspecified site: Secondary | ICD-10-CM | POA: Diagnosis not present

## 2016-02-19 DIAGNOSIS — I10 Essential (primary) hypertension: Secondary | ICD-10-CM | POA: Diagnosis not present

## 2016-02-19 DIAGNOSIS — E119 Type 2 diabetes mellitus without complications: Secondary | ICD-10-CM | POA: Diagnosis not present

## 2016-02-19 DIAGNOSIS — E785 Hyperlipidemia, unspecified: Secondary | ICD-10-CM | POA: Diagnosis not present

## 2016-02-19 DIAGNOSIS — I4891 Unspecified atrial fibrillation: Secondary | ICD-10-CM | POA: Diagnosis not present

## 2016-02-19 DIAGNOSIS — E559 Vitamin D deficiency, unspecified: Secondary | ICD-10-CM | POA: Diagnosis not present

## 2016-03-17 DIAGNOSIS — J40 Bronchitis, not specified as acute or chronic: Secondary | ICD-10-CM | POA: Diagnosis not present

## 2016-03-17 DIAGNOSIS — J069 Acute upper respiratory infection, unspecified: Secondary | ICD-10-CM | POA: Diagnosis not present

## 2016-04-14 DIAGNOSIS — C61 Malignant neoplasm of prostate: Secondary | ICD-10-CM | POA: Diagnosis not present

## 2016-04-17 ENCOUNTER — Ambulatory Visit (INDEPENDENT_AMBULATORY_CARE_PROVIDER_SITE_OTHER): Payer: Commercial Managed Care - HMO | Admitting: Urology

## 2016-04-17 DIAGNOSIS — C61 Malignant neoplasm of prostate: Secondary | ICD-10-CM | POA: Diagnosis not present

## 2016-06-19 ENCOUNTER — Encounter: Payer: Self-pay | Admitting: Cardiovascular Disease

## 2016-06-19 ENCOUNTER — Ambulatory Visit (INDEPENDENT_AMBULATORY_CARE_PROVIDER_SITE_OTHER): Payer: Commercial Managed Care - HMO | Admitting: Cardiovascular Disease

## 2016-06-19 VITALS — BP 126/66 | HR 71 | Ht 74.0 in | Wt 216.0 lb

## 2016-06-19 DIAGNOSIS — I482 Chronic atrial fibrillation, unspecified: Secondary | ICD-10-CM

## 2016-06-19 DIAGNOSIS — I5022 Chronic systolic (congestive) heart failure: Secondary | ICD-10-CM | POA: Diagnosis not present

## 2016-06-19 DIAGNOSIS — I429 Cardiomyopathy, unspecified: Secondary | ICD-10-CM | POA: Diagnosis not present

## 2016-06-19 DIAGNOSIS — I10 Essential (primary) hypertension: Secondary | ICD-10-CM | POA: Diagnosis not present

## 2016-06-19 NOTE — Patient Instructions (Signed)
Your physician wants you to follow-up in:  1 year with Dr Koneswaran You will receive a reminder letter in the mail two months in advance. If you don't receive a letter, please call our office to schedule the follow-up appointment.    Your physician recommends that you continue on your current medications as directed. Please refer to the Current Medication list given to you today.    If you need a refill on your cardiac medications before your next appointment, please call your pharmacy.     Thank you for choosing [facility] !        

## 2016-06-19 NOTE — Addendum Note (Signed)
Addended by: Barbarann Ehlers A on: 06/19/2016 04:12 PM   Modules accepted: Orders

## 2016-06-19 NOTE — Progress Notes (Signed)
SUBJECTIVE: The patient presents for follow up. He has a history of chronic atrial fibrillation, chronic systolic heart failure, hypertension, and hyperlipidemia. He is maintained on warfarin for anticoagulation therapy which is monitored by his PCP.  Echocardiogram ordered by his PCP on 03/26/15 demonstrated mild to moderately reduced left ventricle systolic function, LVEF [id number], diffuse hypokinesis, severe left atrial dilatation, and mild tricuspid regurgitation.  The patient denies any symptoms of chest pain, palpitations, shortness of breath, lightheadedness, dizziness, leg swelling, orthopnea, PND, and syncope.  ECG performed in the office today which I personally interpreted demonstrated atrial fibrillation with a nonspecific T wave abnormality, heart rate 66 bpm.  Told me about his weightlifting days and his college chemistry days.   Soc: Married. 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   Review of Systems: As per "subjective", otherwise negative.  Allergies  Allergen Reactions  . Clarithromycin Other (See Comments)    "aches & pains all over", no appetite, sleepy  . Fluoride Preparations     Current Outpatient Prescriptions  Medication Sig Dispense Refill  . acetaminophen (TYLENOL) 500 MG tablet Take 500 mg by mouth every 6 (six) hours as needed.      Marland Kitchen allopurinol (ZYLOPRIM) 300 MG tablet Take 300 mg by mouth daily.      Marland Kitchen ascorbic acid (VITAMIN C) 250 MG CHEW Chew 500 mg by mouth daily.    . calcium-vitamin D 250-100 MG-UNIT per tablet Take 1 tablet by mouth 2 (two) times daily.    . captopril (CAPOTEN) 100 MG tablet TAKE ONE-HALF TABLET BY MOUTH TWICE DAILY 30 tablet 1  . colchicine 0.6 MG tablet     . diltiazem (CARDIZEM) 120 MG tablet Take 120 mg two times daily 180 tablet 3  . guaifenesin (WAL-TUSSIN) 100 MG/5ML syrup Take 200 mg by mouth 3 (three) times daily as needed.      . lovastatin (MEVACOR)  40 MG tablet TAKE ONE TABLET BY MOUTH EVERY DAY AT BEDTIME 30 tablet 3  . metFORMIN (GLUCOPHAGE) 500 MG tablet Take 500 mg by mouth 3 (three) times daily.     . metoprolol (LOPRESSOR) 100 MG tablet TAKE ONE-HALF TABLET BY MOUTH TWICE DAILY 90 tablet 6  . NON FORMULARY Take 3 tablets by mouth daily. MANGA-CAL    . vitamin E 400 UNIT capsule Take 1,000 Units by mouth daily.     Marland Kitchen warfarin (COUMADIN) 5 MG tablet Take 5 mg by mouth daily. Or as directed by anticoagulation clinic     . zinc gluconate 50 MG tablet Take 50 mg by mouth daily. Takes 1/2 tablet daily     No current facility-administered medications for this visit.     Past Medical History:  Diagnosis Date  . Cancer ([place])   . Chronic atrial fibrillation ([name])   . Chronic bronchitis   . Diabetes mellitus, type II ([place])    no insulin  . Gout   . History of herpes zoster virus   . History of radiation therapy 12/21/12- 02/15/13   prostate 78 gray in 40 fx  . Hyperlipidemia   . Hypertension   . Prostate cancer ([place]) 2014   EBRT + hormonal therapy    Past Surgical History:  Procedure Laterality Date  . KNEE SURGERY     left knee  . PROSTATE BIOPSY      Social History   Social History  . Marital status: Married    Spouse name:  N/A  . Number of children: N/A  . Years of education: N/A   Occupational History  . Not on file.   Social History Main Topics  . Smoking status: Never Smoker  . Smokeless tobacco: Never Used  . Alcohol use No  . Drug use: No  . Sexual activity: Not on file   Other Topics Concern  . Not on file   Social History Narrative  . No narrative on file     Vitals:   06/19/16 1551  BP: 126/66  Pulse: 71  SpO2: 98%  Weight: 216 lb (98 kg)  Height: 6\' 2"  (1.88 m)    PHYSICAL EXAM General: NAD HEENT: Normal. Neck: No JVD, no thyromegaly. Lungs: Clear to auscultation bilaterally with normal respiratory effort. CV: Nondisplaced PMI.  Regular rate and irregular rhythm, normal S1/S2, no  S3, no murmur. No pretibial or periankle edema.  No carotid bruit.   Abdomen: Soft, nontender, no distention.  Neurologic: Alert and oriented.  Psych: Normal affect. Skin: Normal. Musculoskeletal: No gross deformities.    ECG: Most recent ECG reviewed.      ASSESSMENT AND PLAN: 1. Chronic atrial fibrillation: Stable on diltiazem and metoprolol. Continue warfarin for anticoagulation.  2. Essential HTN: Controlled on present therapy. No changes.  3. Chronic systolic heart failure/cardiomyopathy: Euvolemic. Stopped Lasix due to gout in the past. May consider stress test to evaluate for ischemic etiology in the future, but may have been tachycardia-mediated.   Dispo: f/u 1 year.  Kate Sable, M.D., F.A.C.C.

## 2016-07-09 DIAGNOSIS — I10 Essential (primary) hypertension: Secondary | ICD-10-CM | POA: Diagnosis not present

## 2016-07-09 DIAGNOSIS — M1000 Idiopathic gout, unspecified site: Secondary | ICD-10-CM | POA: Diagnosis not present

## 2016-07-09 DIAGNOSIS — E785 Hyperlipidemia, unspecified: Secondary | ICD-10-CM | POA: Diagnosis not present

## 2016-07-09 DIAGNOSIS — I4891 Unspecified atrial fibrillation: Secondary | ICD-10-CM | POA: Diagnosis not present

## 2016-07-09 DIAGNOSIS — E559 Vitamin D deficiency, unspecified: Secondary | ICD-10-CM | POA: Diagnosis not present

## 2016-07-09 DIAGNOSIS — E119 Type 2 diabetes mellitus without complications: Secondary | ICD-10-CM | POA: Diagnosis not present

## 2016-07-28 DIAGNOSIS — H2511 Age-related nuclear cataract, right eye: Secondary | ICD-10-CM | POA: Diagnosis not present

## 2016-07-28 DIAGNOSIS — H2513 Age-related nuclear cataract, bilateral: Secondary | ICD-10-CM | POA: Diagnosis not present

## 2016-07-28 DIAGNOSIS — H02839 Dermatochalasis of unspecified eye, unspecified eyelid: Secondary | ICD-10-CM | POA: Diagnosis not present

## 2016-07-28 DIAGNOSIS — H18411 Arcus senilis, right eye: Secondary | ICD-10-CM | POA: Diagnosis not present

## 2016-10-12 DIAGNOSIS — E559 Vitamin D deficiency, unspecified: Secondary | ICD-10-CM | POA: Diagnosis not present

## 2016-10-12 DIAGNOSIS — Z23 Encounter for immunization: Secondary | ICD-10-CM | POA: Diagnosis not present

## 2016-10-12 DIAGNOSIS — E785 Hyperlipidemia, unspecified: Secondary | ICD-10-CM | POA: Diagnosis not present

## 2016-10-12 DIAGNOSIS — I10 Essential (primary) hypertension: Secondary | ICD-10-CM | POA: Diagnosis not present

## 2016-10-12 DIAGNOSIS — Z510 Encounter for antineoplastic radiation therapy: Secondary | ICD-10-CM | POA: Diagnosis not present

## 2016-10-12 DIAGNOSIS — C61 Malignant neoplasm of prostate: Secondary | ICD-10-CM | POA: Diagnosis not present

## 2016-10-12 DIAGNOSIS — E119 Type 2 diabetes mellitus without complications: Secondary | ICD-10-CM | POA: Diagnosis not present

## 2016-10-12 DIAGNOSIS — M1000 Idiopathic gout, unspecified site: Secondary | ICD-10-CM | POA: Diagnosis not present

## 2016-10-12 DIAGNOSIS — I501 Left ventricular failure: Secondary | ICD-10-CM | POA: Diagnosis not present

## 2016-10-12 DIAGNOSIS — I4891 Unspecified atrial fibrillation: Secondary | ICD-10-CM | POA: Diagnosis not present

## 2016-10-12 DIAGNOSIS — Z7901 Long term (current) use of anticoagulants: Secondary | ICD-10-CM | POA: Diagnosis not present

## 2016-10-19 DIAGNOSIS — H2512 Age-related nuclear cataract, left eye: Secondary | ICD-10-CM | POA: Diagnosis not present

## 2016-10-19 DIAGNOSIS — H2511 Age-related nuclear cataract, right eye: Secondary | ICD-10-CM | POA: Diagnosis not present

## 2016-10-20 DIAGNOSIS — H2512 Age-related nuclear cataract, left eye: Secondary | ICD-10-CM | POA: Diagnosis not present

## 2016-10-26 DIAGNOSIS — H04123 Dry eye syndrome of bilateral lacrimal glands: Secondary | ICD-10-CM | POA: Diagnosis not present

## 2016-10-28 DIAGNOSIS — E119 Type 2 diabetes mellitus without complications: Secondary | ICD-10-CM | POA: Diagnosis not present

## 2016-10-28 DIAGNOSIS — E1121 Type 2 diabetes mellitus with diabetic nephropathy: Secondary | ICD-10-CM | POA: Diagnosis not present

## 2016-10-28 DIAGNOSIS — I10 Essential (primary) hypertension: Secondary | ICD-10-CM | POA: Diagnosis not present

## 2016-11-02 DIAGNOSIS — Z961 Presence of intraocular lens: Secondary | ICD-10-CM | POA: Diagnosis not present

## 2016-11-02 DIAGNOSIS — H2512 Age-related nuclear cataract, left eye: Secondary | ICD-10-CM | POA: Diagnosis not present

## 2016-11-09 DIAGNOSIS — H04123 Dry eye syndrome of bilateral lacrimal glands: Secondary | ICD-10-CM | POA: Diagnosis not present

## 2016-11-11 DIAGNOSIS — I10 Essential (primary) hypertension: Secondary | ICD-10-CM | POA: Diagnosis not present

## 2016-11-11 DIAGNOSIS — R062 Wheezing: Secondary | ICD-10-CM | POA: Diagnosis not present

## 2016-11-16 DIAGNOSIS — I501 Left ventricular failure: Secondary | ICD-10-CM | POA: Diagnosis not present

## 2016-11-16 DIAGNOSIS — I10 Essential (primary) hypertension: Secondary | ICD-10-CM | POA: Diagnosis not present

## 2016-11-16 DIAGNOSIS — I4891 Unspecified atrial fibrillation: Secondary | ICD-10-CM | POA: Diagnosis not present

## 2016-11-16 DIAGNOSIS — E119 Type 2 diabetes mellitus without complications: Secondary | ICD-10-CM | POA: Diagnosis not present

## 2016-12-09 DIAGNOSIS — H2513 Age-related nuclear cataract, bilateral: Secondary | ICD-10-CM | POA: Diagnosis not present

## 2017-01-11 DIAGNOSIS — E1121 Type 2 diabetes mellitus with diabetic nephropathy: Secondary | ICD-10-CM | POA: Diagnosis not present

## 2017-01-11 DIAGNOSIS — I4891 Unspecified atrial fibrillation: Secondary | ICD-10-CM | POA: Diagnosis not present

## 2017-01-11 DIAGNOSIS — I10 Essential (primary) hypertension: Secondary | ICD-10-CM | POA: Diagnosis not present

## 2017-01-22 ENCOUNTER — Other Ambulatory Visit: Payer: Self-pay | Admitting: Cardiovascular Disease

## 2017-02-02 DIAGNOSIS — E1121 Type 2 diabetes mellitus with diabetic nephropathy: Secondary | ICD-10-CM | POA: Diagnosis not present

## 2017-02-02 DIAGNOSIS — Z7901 Long term (current) use of anticoagulants: Secondary | ICD-10-CM | POA: Diagnosis not present

## 2017-02-02 DIAGNOSIS — E559 Vitamin D deficiency, unspecified: Secondary | ICD-10-CM | POA: Diagnosis not present

## 2017-02-02 DIAGNOSIS — E119 Type 2 diabetes mellitus without complications: Secondary | ICD-10-CM | POA: Diagnosis not present

## 2017-02-02 DIAGNOSIS — I10 Essential (primary) hypertension: Secondary | ICD-10-CM | POA: Diagnosis not present

## 2017-02-02 DIAGNOSIS — I4891 Unspecified atrial fibrillation: Secondary | ICD-10-CM | POA: Diagnosis not present

## 2017-02-02 DIAGNOSIS — E785 Hyperlipidemia, unspecified: Secondary | ICD-10-CM | POA: Diagnosis not present

## 2017-04-12 DIAGNOSIS — I10 Essential (primary) hypertension: Secondary | ICD-10-CM | POA: Diagnosis not present

## 2017-04-12 DIAGNOSIS — E119 Type 2 diabetes mellitus without complications: Secondary | ICD-10-CM | POA: Diagnosis not present

## 2017-04-12 DIAGNOSIS — I4891 Unspecified atrial fibrillation: Secondary | ICD-10-CM | POA: Diagnosis not present

## 2017-05-07 DIAGNOSIS — H02839 Dermatochalasis of unspecified eye, unspecified eyelid: Secondary | ICD-10-CM | POA: Diagnosis not present

## 2017-05-07 DIAGNOSIS — H26491 Other secondary cataract, right eye: Secondary | ICD-10-CM | POA: Diagnosis not present

## 2017-05-07 DIAGNOSIS — H02402 Unspecified ptosis of left eyelid: Secondary | ICD-10-CM | POA: Diagnosis not present

## 2017-05-07 DIAGNOSIS — Z961 Presence of intraocular lens: Secondary | ICD-10-CM | POA: Diagnosis not present

## 2017-05-14 DIAGNOSIS — H1013 Acute atopic conjunctivitis, bilateral: Secondary | ICD-10-CM | POA: Diagnosis not present

## 2017-07-02 ENCOUNTER — Ambulatory Visit (INDEPENDENT_AMBULATORY_CARE_PROVIDER_SITE_OTHER): Payer: Medicare HMO | Admitting: Urology

## 2017-07-02 DIAGNOSIS — N5201 Erectile dysfunction due to arterial insufficiency: Secondary | ICD-10-CM

## 2017-07-02 DIAGNOSIS — Z8546 Personal history of malignant neoplasm of prostate: Secondary | ICD-10-CM

## 2017-07-02 DIAGNOSIS — N500 Atrophy of testis: Secondary | ICD-10-CM

## 2017-07-14 DIAGNOSIS — E119 Type 2 diabetes mellitus without complications: Secondary | ICD-10-CM | POA: Diagnosis not present

## 2017-07-14 DIAGNOSIS — E559 Vitamin D deficiency, unspecified: Secondary | ICD-10-CM | POA: Diagnosis not present

## 2017-07-14 DIAGNOSIS — I10 Essential (primary) hypertension: Secondary | ICD-10-CM | POA: Diagnosis not present

## 2017-07-14 DIAGNOSIS — M1000 Idiopathic gout, unspecified site: Secondary | ICD-10-CM | POA: Diagnosis not present

## 2017-07-14 DIAGNOSIS — Z23 Encounter for immunization: Secondary | ICD-10-CM | POA: Diagnosis not present

## 2017-07-14 DIAGNOSIS — I4891 Unspecified atrial fibrillation: Secondary | ICD-10-CM | POA: Diagnosis not present

## 2017-07-20 DIAGNOSIS — H26492 Other secondary cataract, left eye: Secondary | ICD-10-CM | POA: Diagnosis not present

## 2017-07-27 DIAGNOSIS — G44219 Episodic tension-type headache, not intractable: Secondary | ICD-10-CM | POA: Diagnosis not present

## 2017-07-29 ENCOUNTER — Encounter: Payer: Self-pay | Admitting: Cardiovascular Disease

## 2017-07-29 ENCOUNTER — Ambulatory Visit (INDEPENDENT_AMBULATORY_CARE_PROVIDER_SITE_OTHER): Payer: Medicare HMO | Admitting: Cardiovascular Disease

## 2017-07-29 VITALS — BP 122/76 | HR 58 | Ht 74.0 in | Wt 215.0 lb

## 2017-07-29 DIAGNOSIS — I5022 Chronic systolic (congestive) heart failure: Secondary | ICD-10-CM

## 2017-07-29 DIAGNOSIS — Z7901 Long term (current) use of anticoagulants: Secondary | ICD-10-CM

## 2017-07-29 DIAGNOSIS — I482 Chronic atrial fibrillation, unspecified: Secondary | ICD-10-CM

## 2017-07-29 DIAGNOSIS — I429 Cardiomyopathy, unspecified: Secondary | ICD-10-CM | POA: Diagnosis not present

## 2017-07-29 DIAGNOSIS — I10 Essential (primary) hypertension: Secondary | ICD-10-CM

## 2017-07-29 NOTE — Progress Notes (Signed)
SUBJECTIVE: The patient presents for follow up. He has a history of chronic atrial fibrillation, chronic systolic heart failure, hypertension, and hyperlipidemia. He is maintained on warfarin for anticoagulation therapy which is monitored by his PCP.  Echocardiogram ordered by his PCP on 03/26/15 demonstrated mild to moderately reduced left ventricle systolic function, LVEF 96-75%, diffuse hypokinesis, severe left atrial dilatation, and mild tricuspid regurgitation.  The patient denies any symptoms of chest pain, palpitations, shortness of breath, lightheadedness, dizziness, leg swelling, orthopnea, PND, and syncope.  ECG performed in the office today which I personally interpreted demonstrated atrial fibrillation, 57 bpm, old anteroseptal infarct, and late R wave transition.     Soc Hx: Married. 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   Review of Systems: As per "subjective", otherwise negative.  Allergies  Allergen Reactions  . Clarithromycin Other (See Comments)    "aches & pains all over", no appetite, sleepy  . Fluoride Preparations     Current Outpatient Prescriptions  Medication Sig Dispense Refill  . acetaminophen (TYLENOL) 500 MG tablet Take 500 mg by mouth every 6 (six) hours as needed.      Marland Kitchen allopurinol (ZYLOPRIM) 300 MG tablet Take 300 mg by mouth daily.      Marland Kitchen ascorbic acid (VITAMIN C) 250 MG CHEW Chew 500 mg by mouth daily.    . calcium-vitamin D 250-100 MG-UNIT per tablet Take 1 tablet by mouth 2 (two) times daily.    Marland Kitchen diltiazem (CARDIZEM) 120 MG tablet TAKE 1 TABLET TWICE DAILY  (DOSE  DECREASED). 180 tablet 6  . guaifenesin (WAL-TUSSIN) 100 MG/5ML syrup Take 200 mg by mouth 3 (three) times daily as needed.      Marland Kitchen lisinopril (PRINIVIL,ZESTRIL) 10 MG tablet Take 10 mg by mouth daily. TAKES 15 mg DAILY    . lovastatin (MEVACOR) 40 MG tablet TAKE ONE TABLET BY MOUTH EVERY DAY AT BEDTIME 30 tablet 3  .  metoprolol (LOPRESSOR) 100 MG tablet TAKE ONE-HALF TABLET BY MOUTH TWICE DAILY 90 tablet 6  . NON FORMULARY Take 3 tablets by mouth daily. MANGA-CAL    . vitamin E 400 UNIT capsule Take 1,000 Units by mouth daily.     Marland Kitchen warfarin (COUMADIN) 5 MG tablet Take 5 mg by mouth daily. Or as directed by anticoagulation clinic     . zinc gluconate 50 MG tablet Take 50 mg by mouth daily. Takes 1/2 tablet daily     No current facility-administered medications for this visit.     Past Medical History:  Diagnosis Date  . Cancer ([place])   . Chronic atrial fibrillation ([place])   . Chronic bronchitis   . Diabetes mellitus, type II ([place])    no insulin  . Gout   . History of herpes zoster virus   . History of radiation therapy 12/21/12- 02/15/13   prostate 78 gray in 40 fx  . Hyperlipidemia   . Hypertension   . Prostate cancer ([place]) 2014   EBRT + hormonal therapy    Past Surgical History:  Procedure Laterality Date  . KNEE SURGERY     left knee  . PROSTATE BIOPSY      Social History   Social History  . Marital status: Married    Spouse name: N/A  . Number of children: N/A  . Years of education: N/A   Occupational History  . Not on file.   Social History Main Topics  . Smoking status:  Never Smoker  . Smokeless tobacco: Never Used  . Alcohol use No  . Drug use: No  . Sexual activity: Not on file   Other Topics Concern  . Not on file   Social History Narrative  . No narrative on file     Vitals:   07/29/17 1501  BP: 122/76  Pulse: (!) 58  SpO2: 97%  Weight: 215 lb (97.5 kg)  Height: 6\' 2"  (1.88 m)    Wt Readings from Last 3 Encounters:  07/29/17 215 lb (97.5 kg)  06/19/16 216 lb (98 kg)  12/04/15 222 lb (100.7 kg)     PHYSICAL EXAM General: NAD HEENT: Normal. Neck: No JVD, no thyromegaly. Lungs: Clear to auscultation bilaterally with normal respiratory effort. CV: Regular rate and irregular rhythm, normal S1/S2, no S3, no murmur. No pretibial or periankle edema.   No carotid bruit.   Abdomen: Soft, nontender, no distention.  Neurologic: Alert and oriented.  Psych: Normal affect. Skin: Normal. Musculoskeletal: No gross deformities.    ECG: Most recent ECG reviewed.   Labs: Lab Results  Component Value Date/Time   K 4.4 01/07/2010 06:48 PM   BUN 26 (H) 01/07/2010 06:48 PM   CREATININE 1.30 (H) 02/05/2015 01:06 PM   ALT 15 01/07/2010 06:48 PM   HGB 15.4 01/01/2012 03:55 PM     Lipids: Lab Results  Component Value Date/Time   LDLCALC 132 (H) 02/13/2011 10:55 AM   LDLCALC 152 01/21/2009   CHOL 196 02/13/2011 10:55 AM   TRIG 92 02/13/2011 10:55 AM   TRIG 110 01/21/2009   HDL 46 02/13/2011 10:55 AM       ASSESSMENT AND PLAN:  1. Chronic atrial fibrillation: Symptomatically stable on diltiazem and metoprolol.  Anticoagulated with warfarin.  No changes to therapy.  2. Essential HTN: Controlled on present therapy.  No changes.  3. Chronic systolic heart failure/cardiomyopathy: Symptomatically stable.  No longer on furosemide as it led to gout exacerbations in the past.  I may consider stress testing in the future to evaluate for an ischemic etiology, but this may have been tachycardia-mediated.  He is symptomatically stable from the standpoint regardless.    Disposition: Follow up 1 year   Kate Sable, M.D., F.A.C.C.

## 2017-07-29 NOTE — Patient Instructions (Signed)
Your physician wants you to follow-up in:  1 year with Dr.Koneswaran You will receive a reminder letter in the mail two months in advance. If you don't receive a letter, please call our office to schedule the follow-up appointment.    Your physician recommends that you continue on your current medications as directed. Please refer to the Current Medication list given to you today.    If you need a refill on your cardiac medications before your next appointment, please call your pharmacy.      No lab work or tests ordered today.      Thank you for choosing [facility] !        

## 2017-08-02 DIAGNOSIS — I10 Essential (primary) hypertension: Secondary | ICD-10-CM | POA: Diagnosis not present

## 2017-08-02 DIAGNOSIS — E1121 Type 2 diabetes mellitus with diabetic nephropathy: Secondary | ICD-10-CM | POA: Diagnosis not present

## 2017-11-02 DIAGNOSIS — E785 Hyperlipidemia, unspecified: Secondary | ICD-10-CM | POA: Diagnosis not present

## 2017-11-02 DIAGNOSIS — I4891 Unspecified atrial fibrillation: Secondary | ICD-10-CM | POA: Diagnosis not present

## 2017-11-02 DIAGNOSIS — E119 Type 2 diabetes mellitus without complications: Secondary | ICD-10-CM | POA: Diagnosis not present

## 2017-11-02 DIAGNOSIS — E559 Vitamin D deficiency, unspecified: Secondary | ICD-10-CM | POA: Diagnosis not present

## 2017-11-02 DIAGNOSIS — I10 Essential (primary) hypertension: Secondary | ICD-10-CM | POA: Diagnosis not present

## 2017-11-02 DIAGNOSIS — I501 Left ventricular failure: Secondary | ICD-10-CM | POA: Diagnosis not present

## 2017-12-13 DIAGNOSIS — J069 Acute upper respiratory infection, unspecified: Secondary | ICD-10-CM | POA: Diagnosis not present

## 2018-02-01 DIAGNOSIS — I10 Essential (primary) hypertension: Secondary | ICD-10-CM | POA: Diagnosis not present

## 2018-02-01 DIAGNOSIS — R6882 Decreased libido: Secondary | ICD-10-CM | POA: Diagnosis not present

## 2018-02-01 DIAGNOSIS — E119 Type 2 diabetes mellitus without complications: Secondary | ICD-10-CM | POA: Diagnosis not present

## 2018-04-13 ENCOUNTER — Other Ambulatory Visit: Payer: Self-pay | Admitting: Cardiovascular Disease

## 2018-05-17 DIAGNOSIS — Z23 Encounter for immunization: Secondary | ICD-10-CM | POA: Diagnosis not present

## 2018-05-17 DIAGNOSIS — E785 Hyperlipidemia, unspecified: Secondary | ICD-10-CM | POA: Diagnosis not present

## 2018-05-17 DIAGNOSIS — I10 Essential (primary) hypertension: Secondary | ICD-10-CM | POA: Diagnosis not present

## 2018-05-17 DIAGNOSIS — E119 Type 2 diabetes mellitus without complications: Secondary | ICD-10-CM | POA: Diagnosis not present

## 2018-05-17 DIAGNOSIS — I4891 Unspecified atrial fibrillation: Secondary | ICD-10-CM | POA: Diagnosis not present

## 2018-08-18 DIAGNOSIS — Z23 Encounter for immunization: Secondary | ICD-10-CM | POA: Diagnosis not present

## 2018-08-18 DIAGNOSIS — I10 Essential (primary) hypertension: Secondary | ICD-10-CM | POA: Diagnosis not present

## 2018-08-18 DIAGNOSIS — E785 Hyperlipidemia, unspecified: Secondary | ICD-10-CM | POA: Diagnosis not present

## 2018-08-18 DIAGNOSIS — E119 Type 2 diabetes mellitus without complications: Secondary | ICD-10-CM | POA: Diagnosis not present

## 2018-08-18 DIAGNOSIS — I4891 Unspecified atrial fibrillation: Secondary | ICD-10-CM | POA: Diagnosis not present

## 2018-08-22 DIAGNOSIS — J209 Acute bronchitis, unspecified: Secondary | ICD-10-CM | POA: Diagnosis not present

## 2018-08-31 ENCOUNTER — Encounter: Payer: Self-pay | Admitting: Cardiovascular Disease

## 2018-08-31 ENCOUNTER — Ambulatory Visit: Payer: Medicare HMO | Admitting: Cardiovascular Disease

## 2018-08-31 VITALS — BP 120/64 | HR 67 | Ht 74.0 in | Wt 224.6 lb

## 2018-08-31 DIAGNOSIS — I447 Left bundle-branch block, unspecified: Secondary | ICD-10-CM | POA: Diagnosis not present

## 2018-08-31 DIAGNOSIS — I4821 Permanent atrial fibrillation: Secondary | ICD-10-CM | POA: Diagnosis not present

## 2018-08-31 DIAGNOSIS — I5022 Chronic systolic (congestive) heart failure: Secondary | ICD-10-CM | POA: Diagnosis not present

## 2018-08-31 DIAGNOSIS — I428 Other cardiomyopathies: Secondary | ICD-10-CM

## 2018-08-31 DIAGNOSIS — Z7901 Long term (current) use of anticoagulants: Secondary | ICD-10-CM | POA: Diagnosis not present

## 2018-08-31 DIAGNOSIS — I10 Essential (primary) hypertension: Secondary | ICD-10-CM

## 2018-08-31 NOTE — Progress Notes (Signed)
SUBJECTIVE: The patient presents for follow up. He has a history of permanent atrial fibrillation, chronic systolic heart failure, hypertension, and hyperlipidemia. He is maintained on warfarin for anticoagulation therapy which is monitored by his PCP.  Echocardiogram ordered by his PCP on 03/26/15 demonstrated mild to moderately reduced left ventricle systolic function, LVEF 03-00%, diffuse hypokinesis, severe left atrial dilatation, and mild tricuspid regurgitation.  ECG performed in the office today which I ordered and personally interpreted demonstrates atrial fibrillation with an underlying left bundle branch block.  The left bundle branch block is a new finding when compared to prior ECGs.  The patient denies any symptoms of chest pain, palpitations, shortness of breath, lightheadedness, dizziness, leg swelling, orthopnea, PND, and syncope.     Soc Hx: Married. 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  He studied chemistry at [employer] in [place] (now [employer]). His son is a Theme park manager in [place] and his daughter works for the city of [place].  Review of Systems: As per "subjective", otherwise negative.  Allergies  Allergen Reactions  . Clarithromycin Other (See Comments)    "aches & pains all over", no appetite, sleepy  . Fluoride Preparations     Current Outpatient Medications  Medication Sig Dispense Refill  . acetaminophen (TYLENOL) 500 MG tablet Take 500 mg by mouth every 6 (six) hours as needed.      Marland Kitchen allopurinol (ZYLOPRIM) 300 MG tablet Take 300 mg by mouth daily.      Marland Kitchen ascorbic acid (VITAMIN C) 250 MG CHEW Chew 500 mg by mouth daily.    . calcium-vitamin D 250-100 MG-UNIT per tablet Take 1 tablet by mouth 2 (two) times daily.    Marland Kitchen diltiazem (CARDIZEM) 120 MG tablet TAKE 1 TABLET TWICE DAILY  (DOSE  DECREASED). 180 tablet 3  . guaifenesin (WAL-TUSSIN) 100 MG/5ML syrup Take 200 mg by mouth 3  (three) times daily as needed.      Marland Kitchen lisinopril (PRINIVIL,ZESTRIL) 20 MG tablet Take 1 tablet by mouth daily.    Marland Kitchen lovastatin (MEVACOR) 40 MG tablet TAKE ONE TABLET BY MOUTH EVERY DAY AT BEDTIME 30 tablet 3  . metoprolol (LOPRESSOR) 100 MG tablet TAKE ONE-HALF TABLET BY MOUTH TWICE DAILY 90 tablet 6  . NON FORMULARY Take 3 tablets by mouth daily. MANGA-CAL    . vitamin E 400 UNIT capsule Take 1,000 Units by mouth daily.     Marland Kitchen warfarin (COUMADIN) 5 MG tablet Take 5 mg by mouth daily. Or as directed by anticoagulation clinic     . zinc gluconate 50 MG tablet Take 50 mg by mouth daily. Takes 1/2 tablet daily     No current facility-administered medications for this visit.     Past Medical History:  Diagnosis Date  . Cancer ([place])   . Chronic atrial fibrillation   . Chronic bronchitis   . Diabetes mellitus, type II ([place])    no insulin  . Gout   . History of herpes zoster virus   . History of radiation therapy 12/21/12- 02/15/13   prostate 78 gray in 40 fx  . Hyperlipidemia   . Hypertension   . Prostate cancer ([place]) 2014   EBRT + hormonal therapy    Past Surgical History:  Procedure Laterality Date  . KNEE SURGERY     left knee  . PROSTATE BIOPSY      Social History   Socioeconomic History  . Marital status: Married  Spouse name: Not on file  . Number of children: Not on file  . Years of education: Not on file  . Highest education level: Not on file  Occupational History  . Not on file  Social Needs  . Financial resource strain: Not on file  . Food insecurity:    Worry: Not on file    Inability: Not on file  . Transportation needs:    Medical: Not on file    Non-medical: Not on file  Tobacco Use  . Smoking status: Never Smoker  . Smokeless tobacco: Never Used  Substance and Sexual Activity  . Alcohol use: No    Alcohol/week: 0.0 standard drinks  . Drug use: No  . Sexual activity: Not on file  Lifestyle  . Physical activity:    Days per week: Not on file     Minutes per session: Not on file  . Stress: Not on file  Relationships  . Social connections:    Talks on phone: Not on file    Gets together: Not on file    Attends religious service: Not on file    Active member of club or organization: Not on file    Attends meetings of clubs or organizations: Not on file    Relationship status: Not on file  . Intimate partner violence:    Fear of current or ex partner: Not on file    Emotionally abused: Not on file    Physically abused: Not on file    Forced sexual activity: Not on file  Other Topics Concern  . Not on file  Social History Narrative  . Not on file     Vitals:   08/31/18 1504  BP: 120/64  Pulse: 67  SpO2: 97%  Weight: 224 lb 9.6 oz (101.9 kg)  Height: 6\' 2"  (1.88 m)    Wt Readings from Last 3 Encounters:  08/31/18 224 lb 9.6 oz (101.9 kg)  07/29/17 215 lb (97.5 kg)  06/19/16 216 lb (98 kg)     PHYSICAL EXAM General: NAD HEENT: Normal. Neck: No JVD, no thyromegaly. Lungs: Clear to auscultation bilaterally with normal respiratory effort. CV: Regular rate and irregular rhythm, normal S1/S2, no S3, no murmur. No pretibial or periankle edema.  No carotid bruit.   Abdomen: Soft, nontender, no distention.  Neurologic: Alert and oriented.  Psych: Normal affect. Skin: Normal. Musculoskeletal: No gross deformities.    ECG: Reviewed above under Subjective   Labs: Lab Results  Component Value Date/Time   K 4.4 01/07/2010 06:48 PM   BUN 26 (H) 01/07/2010 06:48 PM   CREATININE 1.30 (H) 02/05/2015 01:06 PM   ALT 15 01/07/2010 06:48 PM   HGB 15.4 01/01/2012 03:55 PM     Lipids: Lab Results  Component Value Date/Time   LDLCALC 132 (H) 02/13/2011 10:55 AM   LDLCALC 152 01/21/2009   CHOL 196 02/13/2011 10:55 AM   TRIG 92 02/13/2011 10:55 AM   TRIG 110 01/21/2009   HDL 46 02/13/2011 10:55 AM       ASSESSMENT AND PLAN:  1.  Permanent atrial fibrillation: Symptomatically stable on diltiazem and metoprolol.   Anticoagulated with warfarin.  No changes to therapy.  2. Essential HTN: Controlled on present therapy.  No changes.  3. Chronic systolic heart failure/cardiomyopathy: Symptomatically stable.  No longer on furosemide as it led to gout exacerbations in the past.  I will pursue a Lexiscan Myoview stress test to evaluate for an ischemic etiology, but this may have been tachycardia-mediated.  He is symptomatically stable from this standpoint.  4.  Left bundle branch block: Given his cardiomyopathy, I will proceed with a Lexiscan Myoview stress test to evaluate for an ischemic etiology.   Disposition: Follow up 1 year   Kate Sable, M.D., F.A.C.C.

## 2018-08-31 NOTE — Patient Instructions (Signed)
Medication Instructions:  Your physician recommends that you continue on your current medications as directed. Please refer to the Current Medication list given to you today.   Labwork: NONE  Testing/Procedures: Your physician has requested that you have a lexiscan myoview. For further information please visit [email or web address]. Please follow instruction sheet, as given.    Follow-Up: Your physician wants you to follow-up in:  1 YEAR.  You will receive a reminder letter in the mail two months in advance. If you don't receive a letter, please call our office to schedule the follow-up appointment.   Any Other Special Instructions Will Be Listed Below (If Applicable).     If you need a refill on your cardiac medications before your next appointment, please call your pharmacy.   

## 2018-09-06 ENCOUNTER — Encounter (HOSPITAL_COMMUNITY): Payer: Self-pay

## 2018-09-06 ENCOUNTER — Telehealth: Payer: Self-pay

## 2018-09-06 ENCOUNTER — Encounter (HOSPITAL_BASED_OUTPATIENT_CLINIC_OR_DEPARTMENT_OTHER)
Admission: RE | Admit: 2018-09-06 | Discharge: 2018-09-06 | Disposition: A | Discharge status: Home / Self Care | Payer: Medicare HMO | Source: Ambulatory Visit | Attending: Cardiovascular Disease | Admitting: Cardiovascular Disease

## 2018-09-06 ENCOUNTER — Ambulatory Visit (HOSPITAL_COMMUNITY)
Admission: RE | Admit: 2018-09-06 | Discharge: 2018-09-06 | Disposition: A | Discharge status: Home / Self Care | Payer: Medicare HMO | Source: Ambulatory Visit | Attending: Cardiovascular Disease | Admitting: Cardiovascular Disease

## 2018-09-06 DIAGNOSIS — I447 Left bundle-branch block, unspecified: Secondary | ICD-10-CM

## 2018-09-06 DIAGNOSIS — I428 Other cardiomyopathies: Secondary | ICD-10-CM

## 2018-09-06 LAB — NM MYOCAR MULTI W/SPECT W/WALL MOTION / EF
CSEPPHR: 100 {beats}/min
LV dias vol: 122 mL (ref 62–150)
LV sys vol: 69 mL
RATE: 0.38
Rest HR: 76 {beats}/min
SDS: 3
SRS: 4
SSS: 7
TID: 1.07

## 2018-09-06 MED ORDER — REGADENOSON 0.4 MG/5ML IV SOLN
INTRAVENOUS | Status: AC
  Administered 2018-09-06: 0.4 mg via INTRAVENOUS
  Filled 2018-09-06: qty 5

## 2018-09-06 MED ORDER — SODIUM CHLORIDE 0.9% FLUSH
INTRAVENOUS | Status: AC
  Administered 2018-09-06: 10 mL via INTRAVENOUS
  Filled 2018-09-06: qty 10

## 2018-09-06 MED ORDER — TECHNETIUM TC 99M TETROFOSMIN IV KIT
10.0000 | PACK | Freq: Once | INTRAVENOUS | Status: AC | PRN
  Administered 2018-09-06: 11 via INTRAVENOUS

## 2018-09-06 MED ORDER — TECHNETIUM TC 99M TETROFOSMIN IV KIT
30.0000 | PACK | Freq: Once | INTRAVENOUS | Status: AC | PRN
  Administered 2018-09-06: 32 via INTRAVENOUS

## 2018-09-06 NOTE — Telephone Encounter (Signed)
Called pt. No answer, left message for pt to return call.  

## 2018-09-06 NOTE — Telephone Encounter (Signed)
-----   Message from Herminio Commons, MD sent at 09/06/2018  2:57 PM EST ----- Some degree of scar tissue seen. No significant blockages. I would like to wean him off diltiazem and switch Lopressor to Toprol XL. Please reconfirm doses of Lopressor and diltiazem.

## 2018-09-07 ENCOUNTER — Telehealth: Payer: Self-pay

## 2018-09-07 NOTE — Telephone Encounter (Signed)
-----   Message from Herminio Commons, MD sent at 09/06/2018  2:57 PM EST ----- Some degree of scar tissue seen. No significant blockages. I would like to wean him off diltiazem and switch Lopressor to Toprol XL. Please reconfirm doses of Lopressor and diltiazem.

## 2018-09-07 NOTE — Telephone Encounter (Signed)
Spoke with pt . He is currently taking diltiazem 120 mg BID, Lopressor 100 mg daily .

## 2018-09-07 NOTE — Telephone Encounter (Signed)
Called pt. No answer. Left message for pt to return call.  

## 2018-09-07 NOTE — Telephone Encounter (Signed)
I would like to switch him to Toprol-Xl 100 mg bid and stop diltiazem as this will help the pumping function of his heart and with heart rate control for atrial fibrillation.

## 2018-09-08 ENCOUNTER — Telehealth: Payer: Self-pay

## 2018-09-08 MED ORDER — METOPROLOL SUCCINATE ER 100 MG PO TB24: 100.0000 mg | ORAL_TABLET | Freq: Every day | ORAL | 3 refills | Status: DC

## 2018-09-08 NOTE — Telephone Encounter (Signed)
Called pt. Np answer, left message for pt to return call. Dc'd medications- sent in RX for Toprol XL 100 DAILY.

## 2018-09-08 NOTE — Telephone Encounter (Signed)
Spoke to pt's wife. She wrote medication changes down for pt, so he can understand as he does not hear very well over phone. She voiced understanding.

## 2018-09-08 NOTE — Addendum Note (Signed)
Addended by: Debbora Lacrosse R on: 09/08/2018 12:36 PM   Modules accepted: Orders

## 2018-09-08 NOTE — Telephone Encounter (Signed)
Returning call.

## 2018-09-19 ENCOUNTER — Telehealth: Payer: Self-pay | Admitting: Cardiovascular Disease

## 2018-09-19 NOTE — Telephone Encounter (Signed)
Spoke with pt who states that he has stopped taking Toprol XL on Sat 12/ 21/19 because it was causing him SOB. At that time he restarted taking lopressor and diltiazem, reports that his breathing has improved since then. Pt states that he was out of town this weekend and had BP checked by family member and noted to high. At that time he stated that he "just did not feel good". Pt states that BP was lower this morning when checked but he does not recall what it was. Pt denies feeling dizzy, or having chest pain. He does not have a way to check BP at home. Pt will come in on tomorrow and have BP checked by nurse. Please advise.

## 2018-09-19 NOTE — Telephone Encounter (Signed)
Please give pt a call -- pt's BP was 259/100 yesterday. Pt's wife came into office because patient will not come in. Please give her a call [phone]

## 2018-09-20 ENCOUNTER — Ambulatory Visit (INDEPENDENT_AMBULATORY_CARE_PROVIDER_SITE_OTHER): Payer: Medicare HMO | Admitting: *Deleted

## 2018-09-20 VITALS — BP 146/78 | HR 50 | Ht 74.0 in | Wt 225.0 lb

## 2018-09-20 DIAGNOSIS — I10 Essential (primary) hypertension: Secondary | ICD-10-CM | POA: Diagnosis not present

## 2018-09-20 MED ORDER — CARVEDILOL 25 MG PO TABS: 25.0000 mg | ORAL_TABLET | Freq: Two times a day (BID) | ORAL | 3 refills | Status: DC

## 2018-09-20 NOTE — Telephone Encounter (Signed)
Dilt is not the best long term option for him since the pumping function of his heart is mildly decreased. Can he try stopping the dilt and lopressor again, and try taking coreg 25mg  bid. This would be a better option for both his afib and also to help strengthen his heart   Zandra Abts MD

## 2018-09-20 NOTE — Progress Notes (Signed)
Pt is c/o SOB when walking. States that it has improved a little since switching back to Lopressor, diltiazem.

## 2018-09-20 NOTE — Telephone Encounter (Signed)
Pt and wife notified.

## 2018-10-25 DIAGNOSIS — R05 Cough: Secondary | ICD-10-CM | POA: Diagnosis not present

## 2018-11-21 DIAGNOSIS — I10 Essential (primary) hypertension: Secondary | ICD-10-CM | POA: Diagnosis not present

## 2018-11-21 DIAGNOSIS — R05 Cough: Secondary | ICD-10-CM | POA: Diagnosis not present

## 2018-11-21 DIAGNOSIS — I4891 Unspecified atrial fibrillation: Secondary | ICD-10-CM | POA: Diagnosis not present

## 2018-11-21 DIAGNOSIS — E119 Type 2 diabetes mellitus without complications: Secondary | ICD-10-CM | POA: Diagnosis not present

## 2018-11-21 DIAGNOSIS — Z7901 Long term (current) use of anticoagulants: Secondary | ICD-10-CM | POA: Diagnosis not present

## 2018-11-29 DIAGNOSIS — Z7901 Long term (current) use of anticoagulants: Secondary | ICD-10-CM | POA: Diagnosis not present

## 2018-12-16 ENCOUNTER — Encounter (INDEPENDENT_AMBULATORY_CARE_PROVIDER_SITE_OTHER): Payer: Self-pay | Admitting: Nurse Practitioner

## 2019-01-03 DIAGNOSIS — I4891 Unspecified atrial fibrillation: Secondary | ICD-10-CM | POA: Diagnosis not present

## 2019-01-03 DIAGNOSIS — Z7901 Long term (current) use of anticoagulants: Secondary | ICD-10-CM | POA: Diagnosis not present

## 2019-01-04 DIAGNOSIS — I4891 Unspecified atrial fibrillation: Secondary | ICD-10-CM | POA: Diagnosis not present

## 2019-01-04 DIAGNOSIS — Z7901 Long term (current) use of anticoagulants: Secondary | ICD-10-CM | POA: Diagnosis not present

## 2019-01-16 DIAGNOSIS — R600 Localized edema: Secondary | ICD-10-CM | POA: Diagnosis not present

## 2019-01-23 ENCOUNTER — Ambulatory Visit (INDEPENDENT_AMBULATORY_CARE_PROVIDER_SITE_OTHER): Payer: Medicare HMO | Admitting: Nurse Practitioner

## 2019-02-01 DIAGNOSIS — R600 Localized edema: Secondary | ICD-10-CM | POA: Diagnosis not present

## 2019-02-01 DIAGNOSIS — Z7901 Long term (current) use of anticoagulants: Secondary | ICD-10-CM | POA: Diagnosis not present

## 2019-02-01 DIAGNOSIS — E119 Type 2 diabetes mellitus without complications: Secondary | ICD-10-CM | POA: Diagnosis not present

## 2019-02-01 DIAGNOSIS — I4891 Unspecified atrial fibrillation: Secondary | ICD-10-CM | POA: Diagnosis not present

## 2019-02-01 DIAGNOSIS — I10 Essential (primary) hypertension: Secondary | ICD-10-CM | POA: Diagnosis not present

## 2019-02-07 DIAGNOSIS — I10 Essential (primary) hypertension: Secondary | ICD-10-CM | POA: Diagnosis not present

## 2019-02-07 DIAGNOSIS — Z7901 Long term (current) use of anticoagulants: Secondary | ICD-10-CM | POA: Diagnosis not present

## 2019-02-07 DIAGNOSIS — I4891 Unspecified atrial fibrillation: Secondary | ICD-10-CM | POA: Diagnosis not present

## 2019-02-15 DIAGNOSIS — E785 Hyperlipidemia, unspecified: Secondary | ICD-10-CM | POA: Diagnosis not present

## 2019-02-15 DIAGNOSIS — Z7901 Long term (current) use of anticoagulants: Secondary | ICD-10-CM | POA: Diagnosis not present

## 2019-02-22 DIAGNOSIS — Z7901 Long term (current) use of anticoagulants: Secondary | ICD-10-CM | POA: Diagnosis not present

## 2019-02-22 DIAGNOSIS — I10 Essential (primary) hypertension: Secondary | ICD-10-CM | POA: Diagnosis not present

## 2019-02-22 DIAGNOSIS — E119 Type 2 diabetes mellitus without complications: Secondary | ICD-10-CM | POA: Diagnosis not present

## 2019-02-22 DIAGNOSIS — L97909 Non-pressure chronic ulcer of unspecified part of unspecified lower leg with unspecified severity: Secondary | ICD-10-CM | POA: Diagnosis not present

## 2019-02-28 ENCOUNTER — Encounter (HOSPITAL_BASED_OUTPATIENT_CLINIC_OR_DEPARTMENT_OTHER): Payer: Medicare HMO | Attending: Internal Medicine

## 2019-02-28 ENCOUNTER — Other Ambulatory Visit: Payer: Self-pay

## 2019-02-28 DIAGNOSIS — E11622 Type 2 diabetes mellitus with other skin ulcer: Secondary | ICD-10-CM | POA: Diagnosis not present

## 2019-02-28 DIAGNOSIS — L97212 Non-pressure chronic ulcer of right calf with fat layer exposed: Secondary | ICD-10-CM | POA: Diagnosis not present

## 2019-02-28 DIAGNOSIS — L97222 Non-pressure chronic ulcer of left calf with fat layer exposed: Secondary | ICD-10-CM | POA: Insufficient documentation

## 2019-02-28 DIAGNOSIS — E1151 Type 2 diabetes mellitus with diabetic peripheral angiopathy without gangrene: Secondary | ICD-10-CM | POA: Insufficient documentation

## 2019-02-28 DIAGNOSIS — I87333 Chronic venous hypertension (idiopathic) with ulcer and inflammation of bilateral lower extremity: Secondary | ICD-10-CM | POA: Diagnosis not present

## 2019-02-28 DIAGNOSIS — E1142 Type 2 diabetes mellitus with diabetic polyneuropathy: Secondary | ICD-10-CM | POA: Diagnosis not present

## 2019-02-28 DIAGNOSIS — L97822 Non-pressure chronic ulcer of other part of left lower leg with fat layer exposed: Secondary | ICD-10-CM | POA: Diagnosis not present

## 2019-02-28 DIAGNOSIS — I482 Chronic atrial fibrillation, unspecified: Secondary | ICD-10-CM | POA: Diagnosis not present

## 2019-02-28 DIAGNOSIS — I429 Cardiomyopathy, unspecified: Secondary | ICD-10-CM | POA: Insufficient documentation

## 2019-02-28 DIAGNOSIS — Z8546 Personal history of malignant neoplasm of prostate: Secondary | ICD-10-CM | POA: Diagnosis not present

## 2019-02-28 DIAGNOSIS — L97811 Non-pressure chronic ulcer of other part of right lower leg limited to breakdown of skin: Secondary | ICD-10-CM | POA: Diagnosis not present

## 2019-02-28 DIAGNOSIS — Z7901 Long term (current) use of anticoagulants: Secondary | ICD-10-CM | POA: Diagnosis not present

## 2019-02-28 DIAGNOSIS — Z923 Personal history of irradiation: Secondary | ICD-10-CM | POA: Insufficient documentation

## 2019-02-28 DIAGNOSIS — I10 Essential (primary) hypertension: Secondary | ICD-10-CM | POA: Diagnosis not present

## 2019-03-01 DIAGNOSIS — I4891 Unspecified atrial fibrillation: Secondary | ICD-10-CM | POA: Diagnosis not present

## 2019-03-01 DIAGNOSIS — Z7901 Long term (current) use of anticoagulants: Secondary | ICD-10-CM | POA: Diagnosis not present

## 2019-03-07 DIAGNOSIS — L97811 Non-pressure chronic ulcer of other part of right lower leg limited to breakdown of skin: Secondary | ICD-10-CM | POA: Diagnosis not present

## 2019-03-07 DIAGNOSIS — E1142 Type 2 diabetes mellitus with diabetic polyneuropathy: Secondary | ICD-10-CM | POA: Diagnosis not present

## 2019-03-07 DIAGNOSIS — I10 Essential (primary) hypertension: Secondary | ICD-10-CM | POA: Diagnosis not present

## 2019-03-07 DIAGNOSIS — S81802A Unspecified open wound, left lower leg, initial encounter: Secondary | ICD-10-CM | POA: Diagnosis not present

## 2019-03-07 DIAGNOSIS — E11622 Type 2 diabetes mellitus with other skin ulcer: Secondary | ICD-10-CM | POA: Diagnosis not present

## 2019-03-07 DIAGNOSIS — I87333 Chronic venous hypertension (idiopathic) with ulcer and inflammation of bilateral lower extremity: Secondary | ICD-10-CM | POA: Diagnosis not present

## 2019-03-07 DIAGNOSIS — L97822 Non-pressure chronic ulcer of other part of left lower leg with fat layer exposed: Secondary | ICD-10-CM | POA: Diagnosis not present

## 2019-03-07 DIAGNOSIS — E1151 Type 2 diabetes mellitus with diabetic peripheral angiopathy without gangrene: Secondary | ICD-10-CM | POA: Diagnosis not present

## 2019-03-07 DIAGNOSIS — L97222 Non-pressure chronic ulcer of left calf with fat layer exposed: Secondary | ICD-10-CM | POA: Diagnosis not present

## 2019-03-07 DIAGNOSIS — S81801A Unspecified open wound, right lower leg, initial encounter: Secondary | ICD-10-CM | POA: Diagnosis not present

## 2019-03-07 DIAGNOSIS — I872 Venous insufficiency (chronic) (peripheral): Secondary | ICD-10-CM | POA: Diagnosis not present

## 2019-03-07 DIAGNOSIS — L97212 Non-pressure chronic ulcer of right calf with fat layer exposed: Secondary | ICD-10-CM | POA: Diagnosis not present

## 2019-03-08 DIAGNOSIS — Z7901 Long term (current) use of anticoagulants: Secondary | ICD-10-CM | POA: Diagnosis not present

## 2019-03-14 DIAGNOSIS — E1151 Type 2 diabetes mellitus with diabetic peripheral angiopathy without gangrene: Secondary | ICD-10-CM | POA: Diagnosis not present

## 2019-03-14 DIAGNOSIS — L97822 Non-pressure chronic ulcer of other part of left lower leg with fat layer exposed: Secondary | ICD-10-CM | POA: Diagnosis not present

## 2019-03-14 DIAGNOSIS — S81802A Unspecified open wound, left lower leg, initial encounter: Secondary | ICD-10-CM | POA: Diagnosis not present

## 2019-03-14 DIAGNOSIS — L97811 Non-pressure chronic ulcer of other part of right lower leg limited to breakdown of skin: Secondary | ICD-10-CM | POA: Diagnosis not present

## 2019-03-14 DIAGNOSIS — S81801A Unspecified open wound, right lower leg, initial encounter: Secondary | ICD-10-CM | POA: Diagnosis not present

## 2019-03-14 DIAGNOSIS — I10 Essential (primary) hypertension: Secondary | ICD-10-CM | POA: Diagnosis not present

## 2019-03-14 DIAGNOSIS — L97222 Non-pressure chronic ulcer of left calf with fat layer exposed: Secondary | ICD-10-CM | POA: Diagnosis not present

## 2019-03-14 DIAGNOSIS — I87333 Chronic venous hypertension (idiopathic) with ulcer and inflammation of bilateral lower extremity: Secondary | ICD-10-CM | POA: Diagnosis not present

## 2019-03-14 DIAGNOSIS — E1142 Type 2 diabetes mellitus with diabetic polyneuropathy: Secondary | ICD-10-CM | POA: Diagnosis not present

## 2019-03-14 DIAGNOSIS — L97212 Non-pressure chronic ulcer of right calf with fat layer exposed: Secondary | ICD-10-CM | POA: Diagnosis not present

## 2019-03-14 DIAGNOSIS — E11622 Type 2 diabetes mellitus with other skin ulcer: Secondary | ICD-10-CM | POA: Diagnosis not present

## 2019-03-15 ENCOUNTER — Ambulatory Visit (INDEPENDENT_AMBULATORY_CARE_PROVIDER_SITE_OTHER): Payer: Medicare HMO | Admitting: Cardiovascular Disease

## 2019-03-15 ENCOUNTER — Telehealth: Payer: Self-pay

## 2019-03-15 ENCOUNTER — Other Ambulatory Visit: Payer: Self-pay

## 2019-03-15 ENCOUNTER — Encounter: Payer: Self-pay | Admitting: Cardiovascular Disease

## 2019-03-15 VITALS — BP 138/90 | HR 83 | Temp 98.0°F | Ht 74.0 in | Wt 204.0 lb

## 2019-03-15 DIAGNOSIS — E1142 Type 2 diabetes mellitus with diabetic polyneuropathy: Secondary | ICD-10-CM | POA: Diagnosis not present

## 2019-03-15 DIAGNOSIS — I10 Essential (primary) hypertension: Secondary | ICD-10-CM

## 2019-03-15 DIAGNOSIS — I739 Peripheral vascular disease, unspecified: Secondary | ICD-10-CM | POA: Diagnosis not present

## 2019-03-15 DIAGNOSIS — I482 Chronic atrial fibrillation, unspecified: Secondary | ICD-10-CM | POA: Diagnosis not present

## 2019-03-15 DIAGNOSIS — L97811 Non-pressure chronic ulcer of other part of right lower leg limited to breakdown of skin: Secondary | ICD-10-CM | POA: Diagnosis not present

## 2019-03-15 DIAGNOSIS — L97212 Non-pressure chronic ulcer of right calf with fat layer exposed: Secondary | ICD-10-CM | POA: Diagnosis not present

## 2019-03-15 DIAGNOSIS — I87333 Chronic venous hypertension (idiopathic) with ulcer and inflammation of bilateral lower extremity: Secondary | ICD-10-CM | POA: Diagnosis not present

## 2019-03-15 DIAGNOSIS — I872 Venous insufficiency (chronic) (peripheral): Secondary | ICD-10-CM

## 2019-03-15 DIAGNOSIS — L97822 Non-pressure chronic ulcer of other part of left lower leg with fat layer exposed: Secondary | ICD-10-CM | POA: Diagnosis not present

## 2019-03-15 DIAGNOSIS — I83009 Varicose veins of unspecified lower extremity with ulcer of unspecified site: Secondary | ICD-10-CM | POA: Insufficient documentation

## 2019-03-15 DIAGNOSIS — L97211 Non-pressure chronic ulcer of right calf limited to breakdown of skin: Secondary | ICD-10-CM

## 2019-03-15 DIAGNOSIS — E11622 Type 2 diabetes mellitus with other skin ulcer: Secondary | ICD-10-CM | POA: Diagnosis not present

## 2019-03-15 DIAGNOSIS — E1151 Type 2 diabetes mellitus with diabetic peripheral angiopathy without gangrene: Secondary | ICD-10-CM | POA: Diagnosis not present

## 2019-03-15 DIAGNOSIS — L97222 Non-pressure chronic ulcer of left calf with fat layer exposed: Secondary | ICD-10-CM | POA: Diagnosis not present

## 2019-03-15 NOTE — Telephone Encounter (Signed)
Pt already aware of updated medication instructions included on 6/17 AVS. Mailed updated copy to pt address on file

## 2019-03-15 NOTE — Assessment & Plan Note (Signed)
Mr Daigler  was referred to me by Dr. Dellia Nims for evaluation of bilateral lower extremity ulcers which appear to be venous stasis.  He did have segmental pressures performed by Dr. Lindon Romp 06/11/2015 which were normal.  He has venous stasis changes of his skin and skin breakdown behind both calves which are currently wrapped.  He has diminished pedal pulses bilaterally.  I doubt that these are ischemic ulcers femoral arterial insufficiency but will get lower extremity arterial Doppler studies to further evaluate.  He is on Coumadin anticoagulation as well for chronic A. fib.

## 2019-03-15 NOTE — Progress Notes (Signed)
03/15/2019 [name]   1941-06-25  [id number]  Primary Physician Doree Albee, MD Primary Cardiologist: Lorretta Harp MD Lupe Carney, [place]  HPI:  [name] is a 78 y.o. thin appearing married Caucasian male father of 2, grandfather 2 grandchildren referred by Dr. Dellia Nims at the wound care center for peripheral vascular valuation because of what appears to be venous stasis ulcers.  His cardiologist is Dr. Bronson Ing.  He has a history of hypertension, hyperlipidemia and nonischemic cardiomyopathy with an EF in the 40 to 45% range as well as chronic A. fib on Coumadin anticoagulation.  He does not smoke.  He is not diabetic.  Is never had a heart attack or stroke.  He has had some skin breakdown in the back of both of his calves are currently wrapped.  He did have segmental pressures performed by Dr. Lindon Romp 03/11/2015 that revealed normal ABIs bilaterally.  He denies claudication.  He was recently given more diuretics and had a good diuresis.  He has venous stasis changes in his skin breakdown as well.   Current Meds  Medication Sig  . acetaminophen (TYLENOL) 500 MG tablet Take 500 mg by mouth every 6 (six) hours as needed.    Marland Kitchen allopurinol (ZYLOPRIM) 300 MG tablet Take 300 mg by mouth daily.    Marland Kitchen ascorbic acid (VITAMIN C) 250 MG CHEW Chew 500 mg by mouth daily.  . carvedilol (COREG) 25 MG tablet Take 1 tablet (25 mg total) by mouth 2 (two) times daily.  Marland Kitchen diltiazem (CARDIZEM) 120 MG tablet Take 120 mg by mouth 2 (two) times daily.  . fexofenadine (ALLEGRA) 180 MG tablet Take 180 mg by mouth daily.  Marland Kitchen guaifenesin (WAL-TUSSIN) 100 MG/5ML syrup Take 200 mg by mouth 3 (three) times daily as needed.    Marland Kitchen lisinopril (PRINIVIL,ZESTRIL) 20 MG tablet Take 1 tablet by mouth daily.  Marland Kitchen lovastatin (MEVACOR) 40 MG tablet TAKE ONE TABLET BY MOUTH EVERY DAY AT BEDTIME  . metoprolol tartrate (LOPRESSOR) 100 MG tablet Take 50 mg by mouth 2 (two) times daily.  . NON FORMULARY Take 3  tablets by mouth daily. MANGA-CAL  . vitamin E 400 UNIT capsule Take 1,000 Units by mouth daily.   Marland Kitchen warfarin (COUMADIN) 5 MG tablet Take 5 mg by mouth daily. Or as directed by anticoagulation clinic   . zinc gluconate 50 MG tablet Take 50 mg by mouth daily. Takes 1/2 tablet daily     Allergies  Allergen Reactions  . Clarithromycin Other (See Comments)    "aches & pains all over", no appetite, sleepy  . Fluoride Preparations     Social History   Socioeconomic History  . Marital status: Married    Spouse name: Not on file  . Number of children: Not on file  . Years of education: Not on file  . Highest education level: Not on file  Occupational History  . Not on file  Social Needs  . Financial resource strain: Not on file  . Food insecurity    Worry: Not on file    Inability: Not on file  . Transportation needs    Medical: Not on file    Non-medical: Not on file  Tobacco Use  . Smoking status: Never Smoker  . Smokeless tobacco: Never Used  Substance and Sexual Activity  . Alcohol use: No    Alcohol/week: 0.0 standard drinks  . Drug use: No  . Sexual activity: Not on file  Lifestyle  .  Physical activity    Days per week: Not on file    Minutes per session: Not on file  . Stress: Not on file  Relationships  . Social Herbalist on phone: Not on file    Gets together: Not on file    Attends religious service: Not on file    Active member of club or organization: Not on file    Attends meetings of clubs or organizations: Not on file    Relationship status: Not on file  . Intimate partner violence    Fear of current or ex partner: Not on file    Emotionally abused: Not on file    Physically abused: Not on file    Forced sexual activity: Not on file  Other Topics Concern  . Not on file  Social History Narrative  . Not on file     Review of Systems: General: negative for chills, fever, night sweats or weight changes.  Cardiovascular: negative for chest  pain, dyspnea on exertion, edema, orthopnea, palpitations, paroxysmal nocturnal dyspnea or shortness of breath Dermatological: negative for rash Respiratory: negative for cough or wheezing Urologic: negative for hematuria Abdominal: negative for nausea, vomiting, diarrhea, bright red blood per rectum, melena, or hematemesis Neurologic: negative for visual changes, syncope, or dizziness All other systems reviewed and are otherwise negative except as noted above.    Blood pressure 138/90, pulse 83, temperature 98 F (36.7 C), height 6\' 2"  (1.88 m), weight 204 lb (92.5 kg).  General appearance: alert and no distress Neck: no adenopathy, no carotid bruit, no JVD, supple, symmetrical, trachea midline and thyroid not enlarged, symmetric, no tenderness/mass/nodules Lungs: clear to auscultation bilaterally Heart: irregularly irregular rhythm Extremities: venous stasis dermatitis noted Pulses: Diminished pedal pulses Skin: Venous stasis changes bilaterally with venous ulcers Neurologic: Alert and oriented X 3, normal strength and tone. Normal symmetric reflexes. Normal coordination and gait  EKG atrial fibrillation with a ventricular spots of 83 and evidence of LVH with repolarization changes.  I personally reviewed this EKG.  ASSESSMENT AND PLAN:   Venous stasis ulcers [facility]) [name]  was referred to me by Dr. Dellia Nims for evaluation of bilateral lower extremity ulcers which appear to be venous stasis.  He did have segmental pressures performed by Dr. Lindon Romp 06/11/2015 which were normal.  He has venous stasis changes of his skin and skin breakdown behind both calves which are currently wrapped.  He has diminished pedal pulses bilaterally.  I doubt that these are ischemic ulcers femoral arterial insufficiency but will get lower extremity arterial Doppler studies to further evaluate.  He is on Coumadin anticoagulation as well for chronic A. fib.      Lorretta Harp MD FACP,FACC,FAHA, [facility]  03/15/2019 11:28 AM

## 2019-03-15 NOTE — Patient Instructions (Addendum)
Medication Instructions:  PLEASE CONTACT YOUR PRIMARY CARDIOLOGIST'S OFFICE TO DISCUSS YOUR MEDICATION LIST AND CONFIRM WHAT MEDICATIONS YOU ARE TAKING. CALL OUR [facility] OFFICE AT [phone] TO UPDATE [place] ON YOUR CURRENT MEDICATIONS.  If you need a refill on your cardiac medications before your next appointment, please call your pharmacy.   Lab work: none If you have labs (blood work) drawn today and your tests are completely normal, you will receive your results only by: Marland Kitchen MyChart Message (if you have MyChart) OR . A paper copy in the mail If you have any lab test that is abnormal or we need to change your treatment, we will call you to review the results.  Testing/Procedures: Your physician has requested that you have a lower or upper extremity arterial duplex. This test is an ultrasound of the arteries in the legs or arms. It looks at arterial blood flow in the legs and arms. Allow one hour for Lower and Upper Arterial scans. There are no restrictions or special instructions  Your physician has requested that you have an ankle brachial index (ABI). During this test an ultrasound and blood pressure cuff are used to evaluate the arteries that supply the arms and legs with blood. Allow thirty minutes for this exam. There are no restrictions or special instructions.   Follow-Up: At [facility], you and your health needs are our priority.  As part of our continuing mission to provide you with exceptional heart care, we have created designated Provider Care Teams.  These Care Teams include your primary Cardiologist (physician) and Advanced Practice Providers (APPs -  Physician Assistants and Nurse Practitioners) who all work together to provide you with the care you need, when you need it. You will need a follow up appointment as needed unless your results are abnormal.

## 2019-03-16 DIAGNOSIS — Z7901 Long term (current) use of anticoagulants: Secondary | ICD-10-CM | POA: Diagnosis not present

## 2019-03-21 DIAGNOSIS — L97811 Non-pressure chronic ulcer of other part of right lower leg limited to breakdown of skin: Secondary | ICD-10-CM | POA: Diagnosis not present

## 2019-03-21 DIAGNOSIS — I87333 Chronic venous hypertension (idiopathic) with ulcer and inflammation of bilateral lower extremity: Secondary | ICD-10-CM | POA: Diagnosis not present

## 2019-03-21 DIAGNOSIS — I872 Venous insufficiency (chronic) (peripheral): Secondary | ICD-10-CM | POA: Diagnosis not present

## 2019-03-21 DIAGNOSIS — L97212 Non-pressure chronic ulcer of right calf with fat layer exposed: Secondary | ICD-10-CM | POA: Diagnosis not present

## 2019-03-21 DIAGNOSIS — L97822 Non-pressure chronic ulcer of other part of left lower leg with fat layer exposed: Secondary | ICD-10-CM | POA: Diagnosis not present

## 2019-03-21 DIAGNOSIS — I10 Essential (primary) hypertension: Secondary | ICD-10-CM | POA: Diagnosis not present

## 2019-03-21 DIAGNOSIS — E1142 Type 2 diabetes mellitus with diabetic polyneuropathy: Secondary | ICD-10-CM | POA: Diagnosis not present

## 2019-03-21 DIAGNOSIS — L97222 Non-pressure chronic ulcer of left calf with fat layer exposed: Secondary | ICD-10-CM | POA: Diagnosis not present

## 2019-03-21 DIAGNOSIS — E1151 Type 2 diabetes mellitus with diabetic peripheral angiopathy without gangrene: Secondary | ICD-10-CM | POA: Diagnosis not present

## 2019-03-21 DIAGNOSIS — E11622 Type 2 diabetes mellitus with other skin ulcer: Secondary | ICD-10-CM | POA: Diagnosis not present

## 2019-03-22 DIAGNOSIS — Z7901 Long term (current) use of anticoagulants: Secondary | ICD-10-CM | POA: Diagnosis not present

## 2019-03-28 ENCOUNTER — Encounter (HOSPITAL_COMMUNITY): Payer: Medicare HMO

## 2019-03-28 DIAGNOSIS — L97822 Non-pressure chronic ulcer of other part of left lower leg with fat layer exposed: Secondary | ICD-10-CM | POA: Diagnosis not present

## 2019-03-28 DIAGNOSIS — L97811 Non-pressure chronic ulcer of other part of right lower leg limited to breakdown of skin: Secondary | ICD-10-CM | POA: Diagnosis not present

## 2019-03-28 DIAGNOSIS — E11622 Type 2 diabetes mellitus with other skin ulcer: Secondary | ICD-10-CM | POA: Diagnosis not present

## 2019-03-28 DIAGNOSIS — I10 Essential (primary) hypertension: Secondary | ICD-10-CM | POA: Diagnosis not present

## 2019-03-28 DIAGNOSIS — L97229 Non-pressure chronic ulcer of left calf with unspecified severity: Secondary | ICD-10-CM | POA: Diagnosis not present

## 2019-03-28 DIAGNOSIS — L97222 Non-pressure chronic ulcer of left calf with fat layer exposed: Secondary | ICD-10-CM | POA: Diagnosis not present

## 2019-03-28 DIAGNOSIS — E1151 Type 2 diabetes mellitus with diabetic peripheral angiopathy without gangrene: Secondary | ICD-10-CM | POA: Diagnosis not present

## 2019-03-28 DIAGNOSIS — Z7901 Long term (current) use of anticoagulants: Secondary | ICD-10-CM | POA: Diagnosis not present

## 2019-03-28 DIAGNOSIS — E1142 Type 2 diabetes mellitus with diabetic polyneuropathy: Secondary | ICD-10-CM | POA: Diagnosis not present

## 2019-03-28 DIAGNOSIS — I87333 Chronic venous hypertension (idiopathic) with ulcer and inflammation of bilateral lower extremity: Secondary | ICD-10-CM | POA: Diagnosis not present

## 2019-03-28 DIAGNOSIS — L97212 Non-pressure chronic ulcer of right calf with fat layer exposed: Secondary | ICD-10-CM | POA: Diagnosis not present

## 2019-03-29 ENCOUNTER — Other Ambulatory Visit: Payer: Self-pay

## 2019-03-29 ENCOUNTER — Ambulatory Visit (HOSPITAL_COMMUNITY)
Admission: RE | Admit: 2019-03-29 | Discharge: 2019-03-29 | Disposition: A | Discharge status: Home / Self Care | Payer: Medicare HMO | Source: Ambulatory Visit | Attending: Cardiovascular Disease | Admitting: Cardiovascular Disease

## 2019-03-29 DIAGNOSIS — I872 Venous insufficiency (chronic) (peripheral): Secondary | ICD-10-CM | POA: Insufficient documentation

## 2019-03-29 DIAGNOSIS — I739 Peripheral vascular disease, unspecified: Secondary | ICD-10-CM | POA: Insufficient documentation

## 2019-03-29 DIAGNOSIS — L97211 Non-pressure chronic ulcer of right calf limited to breakdown of skin: Secondary | ICD-10-CM | POA: Insufficient documentation

## 2019-03-30 ENCOUNTER — Encounter (HOSPITAL_BASED_OUTPATIENT_CLINIC_OR_DEPARTMENT_OTHER): Payer: Medicare HMO | Attending: Internal Medicine

## 2019-03-30 ENCOUNTER — Encounter (HOSPITAL_BASED_OUTPATIENT_CLINIC_OR_DEPARTMENT_OTHER): Payer: Medicare HMO

## 2019-03-30 ENCOUNTER — Other Ambulatory Visit: Payer: Self-pay

## 2019-03-30 DIAGNOSIS — I10 Essential (primary) hypertension: Secondary | ICD-10-CM | POA: Diagnosis not present

## 2019-03-30 DIAGNOSIS — L97212 Non-pressure chronic ulcer of right calf with fat layer exposed: Secondary | ICD-10-CM | POA: Insufficient documentation

## 2019-03-30 DIAGNOSIS — E1142 Type 2 diabetes mellitus with diabetic polyneuropathy: Secondary | ICD-10-CM | POA: Diagnosis not present

## 2019-03-30 DIAGNOSIS — I87333 Chronic venous hypertension (idiopathic) with ulcer and inflammation of bilateral lower extremity: Secondary | ICD-10-CM | POA: Diagnosis not present

## 2019-03-30 DIAGNOSIS — L97811 Non-pressure chronic ulcer of other part of right lower leg limited to breakdown of skin: Secondary | ICD-10-CM | POA: Insufficient documentation

## 2019-03-30 DIAGNOSIS — L97822 Non-pressure chronic ulcer of other part of left lower leg with fat layer exposed: Secondary | ICD-10-CM | POA: Diagnosis not present

## 2019-03-30 DIAGNOSIS — Z923 Personal history of irradiation: Secondary | ICD-10-CM | POA: Insufficient documentation

## 2019-03-30 DIAGNOSIS — E1151 Type 2 diabetes mellitus with diabetic peripheral angiopathy without gangrene: Secondary | ICD-10-CM | POA: Insufficient documentation

## 2019-03-30 NOTE — Progress Notes (Signed)
Could not contact pt

## 2019-04-03 ENCOUNTER — Telehealth: Payer: Self-pay | Admitting: Cardiovascular Disease

## 2019-04-03 NOTE — Telephone Encounter (Signed)
New Message            Patient is returning someone's call, pls call back

## 2019-04-04 DIAGNOSIS — E1151 Type 2 diabetes mellitus with diabetic peripheral angiopathy without gangrene: Secondary | ICD-10-CM | POA: Diagnosis not present

## 2019-04-04 DIAGNOSIS — Z923 Personal history of irradiation: Secondary | ICD-10-CM | POA: Diagnosis not present

## 2019-04-04 DIAGNOSIS — L97229 Non-pressure chronic ulcer of left calf with unspecified severity: Secondary | ICD-10-CM | POA: Diagnosis not present

## 2019-04-04 DIAGNOSIS — I87333 Chronic venous hypertension (idiopathic) with ulcer and inflammation of bilateral lower extremity: Secondary | ICD-10-CM | POA: Diagnosis not present

## 2019-04-04 DIAGNOSIS — E1142 Type 2 diabetes mellitus with diabetic polyneuropathy: Secondary | ICD-10-CM | POA: Diagnosis not present

## 2019-04-04 DIAGNOSIS — L97811 Non-pressure chronic ulcer of other part of right lower leg limited to breakdown of skin: Secondary | ICD-10-CM | POA: Diagnosis not present

## 2019-04-04 DIAGNOSIS — L97212 Non-pressure chronic ulcer of right calf with fat layer exposed: Secondary | ICD-10-CM | POA: Diagnosis not present

## 2019-04-04 DIAGNOSIS — I10 Essential (primary) hypertension: Secondary | ICD-10-CM | POA: Diagnosis not present

## 2019-04-04 DIAGNOSIS — L97822 Non-pressure chronic ulcer of other part of left lower leg with fat layer exposed: Secondary | ICD-10-CM | POA: Diagnosis not present

## 2019-04-04 DIAGNOSIS — E11622 Type 2 diabetes mellitus with other skin ulcer: Secondary | ICD-10-CM | POA: Diagnosis not present

## 2019-04-05 DIAGNOSIS — Z7901 Long term (current) use of anticoagulants: Secondary | ICD-10-CM | POA: Diagnosis not present

## 2019-04-05 DIAGNOSIS — E1121 Type 2 diabetes mellitus with diabetic nephropathy: Secondary | ICD-10-CM | POA: Diagnosis not present

## 2019-04-05 DIAGNOSIS — E559 Vitamin D deficiency, unspecified: Secondary | ICD-10-CM | POA: Diagnosis not present

## 2019-04-11 DIAGNOSIS — L97212 Non-pressure chronic ulcer of right calf with fat layer exposed: Secondary | ICD-10-CM | POA: Diagnosis not present

## 2019-04-11 DIAGNOSIS — L97222 Non-pressure chronic ulcer of left calf with fat layer exposed: Secondary | ICD-10-CM | POA: Diagnosis not present

## 2019-04-11 DIAGNOSIS — I87333 Chronic venous hypertension (idiopathic) with ulcer and inflammation of bilateral lower extremity: Secondary | ICD-10-CM | POA: Diagnosis not present

## 2019-04-11 DIAGNOSIS — E1142 Type 2 diabetes mellitus with diabetic polyneuropathy: Secondary | ICD-10-CM | POA: Diagnosis not present

## 2019-04-11 DIAGNOSIS — E1151 Type 2 diabetes mellitus with diabetic peripheral angiopathy without gangrene: Secondary | ICD-10-CM | POA: Diagnosis not present

## 2019-04-11 DIAGNOSIS — L97822 Non-pressure chronic ulcer of other part of left lower leg with fat layer exposed: Secondary | ICD-10-CM | POA: Diagnosis not present

## 2019-04-11 DIAGNOSIS — Z923 Personal history of irradiation: Secondary | ICD-10-CM | POA: Diagnosis not present

## 2019-04-11 DIAGNOSIS — E11622 Type 2 diabetes mellitus with other skin ulcer: Secondary | ICD-10-CM | POA: Diagnosis not present

## 2019-04-11 DIAGNOSIS — I10 Essential (primary) hypertension: Secondary | ICD-10-CM | POA: Diagnosis not present

## 2019-04-11 DIAGNOSIS — L97811 Non-pressure chronic ulcer of other part of right lower leg limited to breakdown of skin: Secondary | ICD-10-CM | POA: Diagnosis not present

## 2019-04-12 DIAGNOSIS — Z7901 Long term (current) use of anticoagulants: Secondary | ICD-10-CM | POA: Diagnosis not present

## 2019-04-18 DIAGNOSIS — E1142 Type 2 diabetes mellitus with diabetic polyneuropathy: Secondary | ICD-10-CM | POA: Diagnosis not present

## 2019-04-18 DIAGNOSIS — Z923 Personal history of irradiation: Secondary | ICD-10-CM | POA: Diagnosis not present

## 2019-04-18 DIAGNOSIS — I87333 Chronic venous hypertension (idiopathic) with ulcer and inflammation of bilateral lower extremity: Secondary | ICD-10-CM | POA: Diagnosis not present

## 2019-04-18 DIAGNOSIS — I10 Essential (primary) hypertension: Secondary | ICD-10-CM | POA: Diagnosis not present

## 2019-04-18 DIAGNOSIS — L97212 Non-pressure chronic ulcer of right calf with fat layer exposed: Secondary | ICD-10-CM | POA: Diagnosis not present

## 2019-04-18 DIAGNOSIS — L97811 Non-pressure chronic ulcer of other part of right lower leg limited to breakdown of skin: Secondary | ICD-10-CM | POA: Diagnosis not present

## 2019-04-18 DIAGNOSIS — E11622 Type 2 diabetes mellitus with other skin ulcer: Secondary | ICD-10-CM | POA: Diagnosis not present

## 2019-04-18 DIAGNOSIS — E1151 Type 2 diabetes mellitus with diabetic peripheral angiopathy without gangrene: Secondary | ICD-10-CM | POA: Diagnosis not present

## 2019-04-18 DIAGNOSIS — L97822 Non-pressure chronic ulcer of other part of left lower leg with fat layer exposed: Secondary | ICD-10-CM | POA: Diagnosis not present

## 2019-04-19 DIAGNOSIS — Z7901 Long term (current) use of anticoagulants: Secondary | ICD-10-CM | POA: Diagnosis not present

## 2019-04-25 DIAGNOSIS — E1151 Type 2 diabetes mellitus with diabetic peripheral angiopathy without gangrene: Secondary | ICD-10-CM | POA: Diagnosis not present

## 2019-04-25 DIAGNOSIS — S81801A Unspecified open wound, right lower leg, initial encounter: Secondary | ICD-10-CM | POA: Diagnosis not present

## 2019-04-25 DIAGNOSIS — L97822 Non-pressure chronic ulcer of other part of left lower leg with fat layer exposed: Secondary | ICD-10-CM | POA: Diagnosis not present

## 2019-04-25 DIAGNOSIS — L97222 Non-pressure chronic ulcer of left calf with fat layer exposed: Secondary | ICD-10-CM | POA: Diagnosis not present

## 2019-04-25 DIAGNOSIS — I87333 Chronic venous hypertension (idiopathic) with ulcer and inflammation of bilateral lower extremity: Secondary | ICD-10-CM | POA: Diagnosis not present

## 2019-04-25 DIAGNOSIS — E11622 Type 2 diabetes mellitus with other skin ulcer: Secondary | ICD-10-CM | POA: Diagnosis not present

## 2019-04-25 DIAGNOSIS — I10 Essential (primary) hypertension: Secondary | ICD-10-CM | POA: Diagnosis not present

## 2019-04-25 DIAGNOSIS — L97212 Non-pressure chronic ulcer of right calf with fat layer exposed: Secondary | ICD-10-CM | POA: Diagnosis not present

## 2019-04-25 DIAGNOSIS — L97811 Non-pressure chronic ulcer of other part of right lower leg limited to breakdown of skin: Secondary | ICD-10-CM | POA: Diagnosis not present

## 2019-04-25 DIAGNOSIS — E1142 Type 2 diabetes mellitus with diabetic polyneuropathy: Secondary | ICD-10-CM | POA: Diagnosis not present

## 2019-04-25 DIAGNOSIS — Z923 Personal history of irradiation: Secondary | ICD-10-CM | POA: Diagnosis not present

## 2019-04-26 ENCOUNTER — Telehealth (INDEPENDENT_AMBULATORY_CARE_PROVIDER_SITE_OTHER): Payer: Self-pay | Admitting: Internal Medicine

## 2019-04-26 DIAGNOSIS — E119 Type 2 diabetes mellitus without complications: Secondary | ICD-10-CM | POA: Diagnosis not present

## 2019-04-26 DIAGNOSIS — I4891 Unspecified atrial fibrillation: Secondary | ICD-10-CM | POA: Diagnosis not present

## 2019-04-26 DIAGNOSIS — I10 Essential (primary) hypertension: Secondary | ICD-10-CM | POA: Diagnosis not present

## 2019-04-26 DIAGNOSIS — L97909 Non-pressure chronic ulcer of unspecified part of unspecified lower leg with unspecified severity: Secondary | ICD-10-CM | POA: Diagnosis not present

## 2019-05-02 ENCOUNTER — Encounter (HOSPITAL_BASED_OUTPATIENT_CLINIC_OR_DEPARTMENT_OTHER): Payer: Medicare HMO | Attending: Internal Medicine

## 2019-05-02 DIAGNOSIS — E1142 Type 2 diabetes mellitus with diabetic polyneuropathy: Secondary | ICD-10-CM | POA: Insufficient documentation

## 2019-05-02 DIAGNOSIS — E11622 Type 2 diabetes mellitus with other skin ulcer: Secondary | ICD-10-CM | POA: Diagnosis not present

## 2019-05-02 DIAGNOSIS — I10 Essential (primary) hypertension: Secondary | ICD-10-CM | POA: Diagnosis not present

## 2019-05-02 DIAGNOSIS — L97212 Non-pressure chronic ulcer of right calf with fat layer exposed: Secondary | ICD-10-CM | POA: Insufficient documentation

## 2019-05-02 DIAGNOSIS — I87333 Chronic venous hypertension (idiopathic) with ulcer and inflammation of bilateral lower extremity: Secondary | ICD-10-CM | POA: Insufficient documentation

## 2019-05-02 DIAGNOSIS — L97822 Non-pressure chronic ulcer of other part of left lower leg with fat layer exposed: Secondary | ICD-10-CM | POA: Insufficient documentation

## 2019-05-02 DIAGNOSIS — E1151 Type 2 diabetes mellitus with diabetic peripheral angiopathy without gangrene: Secondary | ICD-10-CM | POA: Insufficient documentation

## 2019-05-02 DIAGNOSIS — L97222 Non-pressure chronic ulcer of left calf with fat layer exposed: Secondary | ICD-10-CM | POA: Diagnosis not present

## 2019-05-09 DIAGNOSIS — L97222 Non-pressure chronic ulcer of left calf with fat layer exposed: Secondary | ICD-10-CM | POA: Diagnosis not present

## 2019-05-09 DIAGNOSIS — I10 Essential (primary) hypertension: Secondary | ICD-10-CM | POA: Diagnosis not present

## 2019-05-09 DIAGNOSIS — I87333 Chronic venous hypertension (idiopathic) with ulcer and inflammation of bilateral lower extremity: Secondary | ICD-10-CM | POA: Diagnosis not present

## 2019-05-09 DIAGNOSIS — E1151 Type 2 diabetes mellitus with diabetic peripheral angiopathy without gangrene: Secondary | ICD-10-CM | POA: Diagnosis not present

## 2019-05-09 DIAGNOSIS — L97822 Non-pressure chronic ulcer of other part of left lower leg with fat layer exposed: Secondary | ICD-10-CM | POA: Diagnosis not present

## 2019-05-09 DIAGNOSIS — I872 Venous insufficiency (chronic) (peripheral): Secondary | ICD-10-CM | POA: Diagnosis not present

## 2019-05-09 DIAGNOSIS — L97212 Non-pressure chronic ulcer of right calf with fat layer exposed: Secondary | ICD-10-CM | POA: Diagnosis not present

## 2019-05-09 DIAGNOSIS — E11622 Type 2 diabetes mellitus with other skin ulcer: Secondary | ICD-10-CM | POA: Diagnosis not present

## 2019-05-09 DIAGNOSIS — E1142 Type 2 diabetes mellitus with diabetic polyneuropathy: Secondary | ICD-10-CM | POA: Diagnosis not present

## 2019-05-11 NOTE — Telephone Encounter (Signed)
This note is in error

## 2019-05-16 DIAGNOSIS — L97212 Non-pressure chronic ulcer of right calf with fat layer exposed: Secondary | ICD-10-CM | POA: Diagnosis not present

## 2019-05-16 DIAGNOSIS — I87333 Chronic venous hypertension (idiopathic) with ulcer and inflammation of bilateral lower extremity: Secondary | ICD-10-CM | POA: Diagnosis not present

## 2019-05-16 DIAGNOSIS — L97822 Non-pressure chronic ulcer of other part of left lower leg with fat layer exposed: Secondary | ICD-10-CM | POA: Diagnosis not present

## 2019-05-16 DIAGNOSIS — L97211 Non-pressure chronic ulcer of right calf limited to breakdown of skin: Secondary | ICD-10-CM | POA: Diagnosis not present

## 2019-05-16 DIAGNOSIS — E1142 Type 2 diabetes mellitus with diabetic polyneuropathy: Secondary | ICD-10-CM | POA: Diagnosis not present

## 2019-05-16 DIAGNOSIS — E11622 Type 2 diabetes mellitus with other skin ulcer: Secondary | ICD-10-CM | POA: Diagnosis not present

## 2019-05-16 DIAGNOSIS — E1151 Type 2 diabetes mellitus with diabetic peripheral angiopathy without gangrene: Secondary | ICD-10-CM | POA: Diagnosis not present

## 2019-05-16 DIAGNOSIS — I10 Essential (primary) hypertension: Secondary | ICD-10-CM | POA: Diagnosis not present

## 2019-05-23 DIAGNOSIS — L97822 Non-pressure chronic ulcer of other part of left lower leg with fat layer exposed: Secondary | ICD-10-CM | POA: Diagnosis not present

## 2019-05-23 DIAGNOSIS — E11622 Type 2 diabetes mellitus with other skin ulcer: Secondary | ICD-10-CM | POA: Diagnosis not present

## 2019-05-23 DIAGNOSIS — L97212 Non-pressure chronic ulcer of right calf with fat layer exposed: Secondary | ICD-10-CM | POA: Diagnosis not present

## 2019-05-23 DIAGNOSIS — I87333 Chronic venous hypertension (idiopathic) with ulcer and inflammation of bilateral lower extremity: Secondary | ICD-10-CM | POA: Diagnosis not present

## 2019-05-23 DIAGNOSIS — E1151 Type 2 diabetes mellitus with diabetic peripheral angiopathy without gangrene: Secondary | ICD-10-CM | POA: Diagnosis not present

## 2019-05-23 DIAGNOSIS — I87312 Chronic venous hypertension (idiopathic) with ulcer of left lower extremity: Secondary | ICD-10-CM | POA: Diagnosis not present

## 2019-05-23 DIAGNOSIS — L97222 Non-pressure chronic ulcer of left calf with fat layer exposed: Secondary | ICD-10-CM | POA: Diagnosis not present

## 2019-05-23 DIAGNOSIS — I10 Essential (primary) hypertension: Secondary | ICD-10-CM | POA: Diagnosis not present

## 2019-05-23 DIAGNOSIS — E1142 Type 2 diabetes mellitus with diabetic polyneuropathy: Secondary | ICD-10-CM | POA: Diagnosis not present

## 2019-05-30 ENCOUNTER — Other Ambulatory Visit: Payer: Self-pay

## 2019-05-30 ENCOUNTER — Encounter (HOSPITAL_BASED_OUTPATIENT_CLINIC_OR_DEPARTMENT_OTHER): Payer: Medicare HMO | Attending: Internal Medicine

## 2019-05-30 DIAGNOSIS — L97212 Non-pressure chronic ulcer of right calf with fat layer exposed: Secondary | ICD-10-CM | POA: Diagnosis not present

## 2019-05-30 DIAGNOSIS — I10 Essential (primary) hypertension: Secondary | ICD-10-CM | POA: Diagnosis not present

## 2019-05-30 DIAGNOSIS — I890 Lymphedema, not elsewhere classified: Secondary | ICD-10-CM | POA: Insufficient documentation

## 2019-05-30 DIAGNOSIS — Z923 Personal history of irradiation: Secondary | ICD-10-CM | POA: Insufficient documentation

## 2019-05-30 DIAGNOSIS — E1151 Type 2 diabetes mellitus with diabetic peripheral angiopathy without gangrene: Secondary | ICD-10-CM | POA: Diagnosis not present

## 2019-05-30 DIAGNOSIS — I87333 Chronic venous hypertension (idiopathic) with ulcer and inflammation of bilateral lower extremity: Secondary | ICD-10-CM | POA: Diagnosis not present

## 2019-05-30 DIAGNOSIS — I87313 Chronic venous hypertension (idiopathic) with ulcer of bilateral lower extremity: Secondary | ICD-10-CM | POA: Diagnosis not present

## 2019-05-30 DIAGNOSIS — E11622 Type 2 diabetes mellitus with other skin ulcer: Secondary | ICD-10-CM | POA: Diagnosis not present

## 2019-05-30 DIAGNOSIS — L97822 Non-pressure chronic ulcer of other part of left lower leg with fat layer exposed: Secondary | ICD-10-CM | POA: Diagnosis not present

## 2019-05-30 DIAGNOSIS — E1142 Type 2 diabetes mellitus with diabetic polyneuropathy: Secondary | ICD-10-CM | POA: Insufficient documentation

## 2019-06-06 DIAGNOSIS — L97212 Non-pressure chronic ulcer of right calf with fat layer exposed: Secondary | ICD-10-CM | POA: Diagnosis not present

## 2019-06-06 DIAGNOSIS — Z923 Personal history of irradiation: Secondary | ICD-10-CM | POA: Diagnosis not present

## 2019-06-06 DIAGNOSIS — L97822 Non-pressure chronic ulcer of other part of left lower leg with fat layer exposed: Secondary | ICD-10-CM | POA: Diagnosis not present

## 2019-06-06 DIAGNOSIS — I10 Essential (primary) hypertension: Secondary | ICD-10-CM | POA: Diagnosis not present

## 2019-06-06 DIAGNOSIS — E1151 Type 2 diabetes mellitus with diabetic peripheral angiopathy without gangrene: Secondary | ICD-10-CM | POA: Diagnosis not present

## 2019-06-06 DIAGNOSIS — I890 Lymphedema, not elsewhere classified: Secondary | ICD-10-CM | POA: Diagnosis not present

## 2019-06-06 DIAGNOSIS — I87333 Chronic venous hypertension (idiopathic) with ulcer and inflammation of bilateral lower extremity: Secondary | ICD-10-CM | POA: Diagnosis not present

## 2019-06-06 DIAGNOSIS — E1142 Type 2 diabetes mellitus with diabetic polyneuropathy: Secondary | ICD-10-CM | POA: Diagnosis not present

## 2019-06-07 ENCOUNTER — Other Ambulatory Visit: Payer: Self-pay

## 2019-06-07 ENCOUNTER — Ambulatory Visit (INDEPENDENT_AMBULATORY_CARE_PROVIDER_SITE_OTHER): Payer: Medicare HMO | Admitting: Internal Medicine

## 2019-06-07 ENCOUNTER — Encounter (INDEPENDENT_AMBULATORY_CARE_PROVIDER_SITE_OTHER): Payer: Self-pay | Admitting: Internal Medicine

## 2019-06-07 VITALS — BP 120/80 | HR 64 | Ht 74.0 in | Wt 205.4 lb

## 2019-06-07 DIAGNOSIS — Z7901 Long term (current) use of anticoagulants: Secondary | ICD-10-CM | POA: Diagnosis not present

## 2019-06-07 DIAGNOSIS — Z8546 Personal history of malignant neoplasm of prostate: Secondary | ICD-10-CM | POA: Diagnosis not present

## 2019-06-07 DIAGNOSIS — I10 Essential (primary) hypertension: Secondary | ICD-10-CM | POA: Diagnosis not present

## 2019-06-07 DIAGNOSIS — E119 Type 2 diabetes mellitus without complications: Secondary | ICD-10-CM

## 2019-06-07 DIAGNOSIS — C61 Malignant neoplasm of prostate: Secondary | ICD-10-CM

## 2019-06-07 DIAGNOSIS — E782 Mixed hyperlipidemia: Secondary | ICD-10-CM

## 2019-06-07 DIAGNOSIS — M1A0310 Idiopathic chronic gout, right wrist, without tophus (tophi): Secondary | ICD-10-CM | POA: Diagnosis not present

## 2019-06-07 DIAGNOSIS — E559 Vitamin D deficiency, unspecified: Secondary | ICD-10-CM | POA: Diagnosis not present

## 2019-06-07 HISTORY — DX: Vitamin D deficiency, unspecified: E55.9

## 2019-06-07 NOTE — Progress Notes (Signed)
Subjective:  Patient ID: [name], male    DOB: 1941-07-17  Age: 78 y.o. MRN: [id number]  CC: This man comes in for follow-up of his multiple medical problems including diabetes, hypertension, hyperlipidemia, vitamin D deficiency, history of prostate cancer.  HPI He is doing reasonably well.  He continues on glipizide for his diabetes.  His last hemoglobin A1c was measured more than 3 months ago in May 2020 and it was 8.2%. He continues on antihypertensive medication and increased his lisinopril to twice a day which has improved his blood pressure now.  He is tolerated this dose well. He continues on vitamin D3 supplementation 5000 units daily. He denies any increasing dyspnea and he denies any chest pain or palpitations or new limb weakness. He tells me that he did see an eye doctor about a month ago and there was no evidence of diabetic retinopathy.  He denies any paresthesia in his hands or feet.   Past Medical History:  Diagnosis Date  . Cancer ([place])   . Chronic atrial fibrillation   . Chronic bronchitis   . Diabetes mellitus, type II ([place])    no insulin  . Gout   . History of herpes zoster virus   . History of radiation therapy 12/21/12- 02/15/13   prostate 78 gray in 40 fx  . Hyperlipidemia   . Hypertension   . Prostate cancer ([place]) 2014   EBRT + hormonal therapy  . Vitamin D deficiency disease 06/07/2019     Social History   Social History Narrative  . Not on file   Social History   Substance and Sexual Activity  Alcohol Use No  . Alcohol/week: 0.0 standard drinks    Married   Current Meds  Medication Sig  . allopurinol (ZYLOPRIM) 300 MG tablet Take 300 mg by mouth 2 (two) times daily.   . Cholecalciferol (VITAMIN D-3) 125 MCG (5000 UT) TABS Take 5,000 Units by mouth daily at 12 noon.  Marland Kitchen lisinopril (PRINIVIL,ZESTRIL) 20 MG tablet Take 1 tablet by mouth 2 (two) times a day.  . lovastatin (MEVACOR) 40 MG tablet TAKE ONE TABLET BY MOUTH EVERY DAY AT  BEDTIME  . warfarin (COUMADIN) 5 MG tablet Take 5 mg by mouth daily. Or as directed by anticoagulation clinic        Objective:   Today's Vitals: BP 120/80   Pulse 64   Ht 6\' 2"  (1.88 m)   Wt 205 lb 6.4 oz (93.2 kg)   BMI 26.37 kg/m  Vitals with BMI 06/07/2019 03/15/2019 09/20/2018  Height 6\' 2"  6\' 2"  6\' 2"   Weight 205 lbs 6 oz 204 lbs 225 lbs  BMI 26.36 [id number]  Systolic 123456 0000000 [id number]  Diastolic 80 90 78  Pulse 64 83 50       Physical Exam He looks systemically well.  I think he is becoming a little bit more forgetful than he was in the past.  Heart sounds are present and in atrial fibrillation clinically.  Lung fields are entirely clear and there was no evidence of congestive heart failure.   Assessment     1. Essential hypertension   2. Type 2 diabetes mellitus without complication, without long-term current use of insulin ([place])   3. Prostate cancer ([place])   4. Mixed hyperlipidemia   5. 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 Chronic anticoagulation   7. Vitamin D deficiency disease   8. H/O prostate cancer  Plan 1. He will continue with all medications for his chronic conditions above. 2. Blood work is ordered as below. 3. Further recommendations will depend on blood results and I will see him for his annual physical exam and just over 3 months.   Tests Ordered:   Orders Placed This Encounter  Procedures  . COMPLETE METABOLIC PANEL WITH GFR  . VITAMIN D 25 Hydroxy (Vit-D Deficiency, Fractures)  . Hemoglobin A1c  . PSA     Daniel Tidwell Luther Parody, MD

## 2019-06-08 LAB — COMPLETE METABOLIC PANEL WITH GFR
AG Ratio: 1.5 (calc) (ref 1.0–2.5)
ALT: 42 U/L (ref 9–46)
AST: 33 U/L (ref 10–35)
Albumin: 3.9 g/dL (ref 3.6–5.1)
Alkaline phosphatase (APISO): 81 U/L (ref 35–144)
BUN/Creatinine Ratio: 25 (calc) — ABNORMAL HIGH (ref 6–22)
BUN: 28 mg/dL — ABNORMAL HIGH (ref 7–25)
CO2: 22 mmol/L (ref 20–32)
Calcium: 9.1 mg/dL (ref 8.6–10.3)
Chloride: 109 mmol/L (ref 98–110)
Creat: 1.14 mg/dL (ref 0.70–1.18)
GFR, Est African American: 71 mL/min/{1.73_m2} (ref >=60)
GFR, Est Non African American: 62 mL/min/{1.73_m2} (ref >=60)
Globulin: 2.6 g/dL (calc) (ref 1.9–3.7)
Glucose, Bld: 138 mg/dL — ABNORMAL HIGH (ref 65–99)
Potassium: 4.5 mmol/L (ref 3.5–5.3)
Sodium: 141 mmol/L (ref 135–146)
Total Bilirubin: 0.4 mg/dL (ref 0.2–1.2)
Total Protein: 6.5 g/dL (ref 6.1–8.1)

## 2019-06-08 LAB — VITAMIN D 25 HYDROXY (VIT D DEFICIENCY, FRACTURES): Vit D, 25-Hydroxy: 41 ng/mL (ref 30–100)

## 2019-06-08 LAB — HEMOGLOBIN A1C
Hgb A1c MFr Bld: 6.9 % of total Hgb — ABNORMAL HIGH (ref <5.7)
Mean Plasma Glucose: 151 (calc)
eAG (mmol/L): 8.4 (calc)

## 2019-06-08 LAB — PSA: PSA: <0.1 ng/mL (ref <=4.0)

## 2019-06-08 NOTE — Progress Notes (Signed)
I spoke to the patient's wife and recommended that the patient increase vitamin D3 to 10,000 units daily.  Encouraged more water intake as he seems to be somewhat dehydrated.  Follow-up as scheduled.

## 2019-06-13 DIAGNOSIS — I10 Essential (primary) hypertension: Secondary | ICD-10-CM | POA: Diagnosis not present

## 2019-06-13 DIAGNOSIS — L97212 Non-pressure chronic ulcer of right calf with fat layer exposed: Secondary | ICD-10-CM | POA: Diagnosis not present

## 2019-06-13 DIAGNOSIS — Z923 Personal history of irradiation: Secondary | ICD-10-CM | POA: Diagnosis not present

## 2019-06-13 DIAGNOSIS — E11622 Type 2 diabetes mellitus with other skin ulcer: Secondary | ICD-10-CM | POA: Diagnosis not present

## 2019-06-13 DIAGNOSIS — I87312 Chronic venous hypertension (idiopathic) with ulcer of left lower extremity: Secondary | ICD-10-CM | POA: Diagnosis not present

## 2019-06-13 DIAGNOSIS — L97822 Non-pressure chronic ulcer of other part of left lower leg with fat layer exposed: Secondary | ICD-10-CM | POA: Diagnosis not present

## 2019-06-13 DIAGNOSIS — E1142 Type 2 diabetes mellitus with diabetic polyneuropathy: Secondary | ICD-10-CM | POA: Diagnosis not present

## 2019-06-13 DIAGNOSIS — I87333 Chronic venous hypertension (idiopathic) with ulcer and inflammation of bilateral lower extremity: Secondary | ICD-10-CM | POA: Diagnosis not present

## 2019-06-13 DIAGNOSIS — E1151 Type 2 diabetes mellitus with diabetic peripheral angiopathy without gangrene: Secondary | ICD-10-CM | POA: Diagnosis not present

## 2019-06-13 DIAGNOSIS — I890 Lymphedema, not elsewhere classified: Secondary | ICD-10-CM | POA: Diagnosis not present

## 2019-06-20 DIAGNOSIS — I890 Lymphedema, not elsewhere classified: Secondary | ICD-10-CM | POA: Diagnosis not present

## 2019-06-20 DIAGNOSIS — Z923 Personal history of irradiation: Secondary | ICD-10-CM | POA: Diagnosis not present

## 2019-06-20 DIAGNOSIS — L97212 Non-pressure chronic ulcer of right calf with fat layer exposed: Secondary | ICD-10-CM | POA: Diagnosis not present

## 2019-06-20 DIAGNOSIS — E1142 Type 2 diabetes mellitus with diabetic polyneuropathy: Secondary | ICD-10-CM | POA: Diagnosis not present

## 2019-06-20 DIAGNOSIS — I87313 Chronic venous hypertension (idiopathic) with ulcer of bilateral lower extremity: Secondary | ICD-10-CM | POA: Diagnosis not present

## 2019-06-20 DIAGNOSIS — L97222 Non-pressure chronic ulcer of left calf with fat layer exposed: Secondary | ICD-10-CM | POA: Diagnosis not present

## 2019-06-20 DIAGNOSIS — I10 Essential (primary) hypertension: Secondary | ICD-10-CM | POA: Diagnosis not present

## 2019-06-20 DIAGNOSIS — E1151 Type 2 diabetes mellitus with diabetic peripheral angiopathy without gangrene: Secondary | ICD-10-CM | POA: Diagnosis not present

## 2019-06-20 DIAGNOSIS — I87333 Chronic venous hypertension (idiopathic) with ulcer and inflammation of bilateral lower extremity: Secondary | ICD-10-CM | POA: Diagnosis not present

## 2019-06-20 DIAGNOSIS — L97822 Non-pressure chronic ulcer of other part of left lower leg with fat layer exposed: Secondary | ICD-10-CM | POA: Diagnosis not present

## 2019-06-27 DIAGNOSIS — I890 Lymphedema, not elsewhere classified: Secondary | ICD-10-CM | POA: Diagnosis not present

## 2019-06-27 DIAGNOSIS — I872 Venous insufficiency (chronic) (peripheral): Secondary | ICD-10-CM | POA: Diagnosis not present

## 2019-06-27 DIAGNOSIS — E11622 Type 2 diabetes mellitus with other skin ulcer: Secondary | ICD-10-CM | POA: Diagnosis not present

## 2019-06-27 DIAGNOSIS — E1151 Type 2 diabetes mellitus with diabetic peripheral angiopathy without gangrene: Secondary | ICD-10-CM | POA: Diagnosis not present

## 2019-06-27 DIAGNOSIS — I87333 Chronic venous hypertension (idiopathic) with ulcer and inflammation of bilateral lower extremity: Secondary | ICD-10-CM | POA: Diagnosis not present

## 2019-06-27 DIAGNOSIS — L97822 Non-pressure chronic ulcer of other part of left lower leg with fat layer exposed: Secondary | ICD-10-CM | POA: Diagnosis not present

## 2019-06-27 DIAGNOSIS — S81802A Unspecified open wound, left lower leg, initial encounter: Secondary | ICD-10-CM | POA: Diagnosis not present

## 2019-06-27 DIAGNOSIS — E1142 Type 2 diabetes mellitus with diabetic polyneuropathy: Secondary | ICD-10-CM | POA: Diagnosis not present

## 2019-06-27 DIAGNOSIS — L97212 Non-pressure chronic ulcer of right calf with fat layer exposed: Secondary | ICD-10-CM | POA: Diagnosis not present

## 2019-06-27 DIAGNOSIS — I10 Essential (primary) hypertension: Secondary | ICD-10-CM | POA: Diagnosis not present

## 2019-06-27 DIAGNOSIS — Z923 Personal history of irradiation: Secondary | ICD-10-CM | POA: Diagnosis not present

## 2019-07-04 ENCOUNTER — Other Ambulatory Visit: Payer: Self-pay

## 2019-07-04 ENCOUNTER — Encounter (HOSPITAL_BASED_OUTPATIENT_CLINIC_OR_DEPARTMENT_OTHER): Payer: Medicare HMO | Attending: Internal Medicine | Admitting: Internal Medicine

## 2019-07-04 DIAGNOSIS — L97822 Non-pressure chronic ulcer of other part of left lower leg with fat layer exposed: Secondary | ICD-10-CM | POA: Diagnosis not present

## 2019-07-04 DIAGNOSIS — L97212 Non-pressure chronic ulcer of right calf with fat layer exposed: Secondary | ICD-10-CM | POA: Insufficient documentation

## 2019-07-04 DIAGNOSIS — E1151 Type 2 diabetes mellitus with diabetic peripheral angiopathy without gangrene: Secondary | ICD-10-CM | POA: Insufficient documentation

## 2019-07-04 DIAGNOSIS — Z923 Personal history of irradiation: Secondary | ICD-10-CM | POA: Insufficient documentation

## 2019-07-04 DIAGNOSIS — I87333 Chronic venous hypertension (idiopathic) with ulcer and inflammation of bilateral lower extremity: Secondary | ICD-10-CM | POA: Insufficient documentation

## 2019-07-04 DIAGNOSIS — E11622 Type 2 diabetes mellitus with other skin ulcer: Secondary | ICD-10-CM | POA: Diagnosis not present

## 2019-07-04 DIAGNOSIS — E1142 Type 2 diabetes mellitus with diabetic polyneuropathy: Secondary | ICD-10-CM | POA: Insufficient documentation

## 2019-07-04 DIAGNOSIS — I10 Essential (primary) hypertension: Secondary | ICD-10-CM | POA: Insufficient documentation

## 2019-07-04 DIAGNOSIS — I87312 Chronic venous hypertension (idiopathic) with ulcer of left lower extremity: Secondary | ICD-10-CM | POA: Diagnosis not present

## 2019-07-04 DIAGNOSIS — L97222 Non-pressure chronic ulcer of left calf with fat layer exposed: Secondary | ICD-10-CM | POA: Diagnosis not present

## 2019-07-04 NOTE — Progress Notes (Signed)
[facility], ILLESCAS ([id number]) Visit Report for 07/04/2019 HPI Details Patient Name: Date of Service: [name], [name] 07/04/2019 8:00 AM Medical Record [place] Patient Account Number: [id number] Date of Birth/Sex: Treating RN: [birth year] (78 y.o. [name]) [name] Primary Care Provider: Hurshel Party Other Clinician: Referring Provider: Treating Provider/Extender:Eliaz Fout, Cheryl Flash, [place] Weeks in Treatment: Gross History of Present Illness Location: Patient presents with a wound to left lower leg. Quality: Patient reports No Pain. Duration: 2 months HPI Description: no cig or alcohol. spontaneous appearance in area of stasis dermamtitis. [name] on metformin only. chronic afib on Coumadin. diabetes and coag studies not good. hba1c 7.5. ivr 4.5. no pain or sxs of systemic disease. hx chf. no intermittent claudication 02/28/2019 Readmission This is a now a 78 year old man who was previously cared for in 2016 by Dr. Lindon Romp for wounds on his lower extremities. At that point he had venous reflux studies although I cannot seem to open these in [place] link. He had arterial studies showing an ABI of 1.11 on the right and 1.27 on the left his waveforms were triphasic bilaterally. He was discharged in stockings although I do not believe he is wearing these in some time. He tells me that about a month ago he noted openings of a large wound on the posterior right calf and 2 smaller areas on the left lateral calf and a small area more recently on the left posterior calf. He has been dressing these with peroxide and triple antibiotic ointment. He is not wearing compression. Past medical history; type 2 diabetes with peripheral neuropathy, chronic venous insufficiency, hypertension, cardiomyopathy, chronic atrial fibrillation on Coumadin, prostate cancer, hyperlipidemia, gout, ABI in our clinic was 1.34 on the left and not obtainable on the right 6/9; this is a patient who has chronic  venous insufficiency. He has a fairly substantial area on the right posterior calf, left lateral calf and a small area on the left posterior calf. On arrival last week he had very palpable popliteal and femoral pulses but nothing in his bilateral feet. Unfortunately we cannot get arterial studies until July 1 at Dr. Kennon Holter office. They live in [place]. We use silver alginate under Kerlix Coban 6/16; patient with chronic venous insufficiency with wounds on his bilateral lower extremities. When he came into our clinic he was discovered to have a complete absence of peripheral pedal pulses at either the dorsalis pedis or posterior tibial. He does have easily palpable femoral and popliteal pulses. He sees Dr. [name] tomorrow for noninvasive arterial tests. He may also require venous reflux evaluation although I do not view this as an urgent thing. We have been using silver alginate. His wound surfaces of cleaned up quite nicely 6/23; patient with chronic venous insufficiency with wounds on his bilateral lower extremities. His wounds all are somewhat better looking. He did go to Dr. Kennon Holter office but somehow ended up on the doctors schedule rather than being scheduled for noninvasive tests therefore his noninvasive tests are scheduled for July 1. We agree that he has venous insufficiency ulcers but I cannot feel any pulses in his lower extremities dictating the need for test. We are only using Kerlix and light Coban unfortunately this appears to be holding the edema 6/30; has his arterial studies tomorrow. We have been using Kerlix and light Coban will go to a more aggressive compression if the arterial studies will allow. We all agreed these are venous wounds however I cannot feel pulses at either the dorsalis pedis or posterior tibial  bilaterally. His wounds generally look some better including left lateral and right posterior. 7/7-Patient returns at 1 week in Kerlix/Coban to both legs, with  improvement, in the left lateral and right posterior lower leg wounds, ABI's are normal in both legs per vascular studies, TBI is also normal on both sides, we are using hydrofera blue to the wounds 7/14; patient's arterial studies from 2 weeks ago showed an ABI on the right at 1.03 with a TBI of 0.86. On the left the ABI was 1.06 with a TBI of 0.84. Notable for the fact that his arterial waveforms were monophasic in all of the lower extremity arteries suggesting some degree of arterial occlusive disease but in general this was felt to be fairly adequate for healing. His compression was increased from 2-3 layer which is appropriate. Dressing was changed to [facility] 7/21; patient's wounds are measuring smaller. The more substantial one on the right posterior calf, second 1 on the left lateral calf. Using [facility] on both wound areas 7/28; patient continues to make nice improvements. The area on the right posterior calf is smaller. Area on the left lateral calf also is smaller. We have been using Hydrofera Blue under compression. The patient will need compression stockings and we have measured him for these in the eventuality that these heal which really should not be too long from now 8/4-Patient continues to make improvement, the right posterior calf area smaller with rim of keratotic skin on one side, the left wound is definitely smaller and improving. 8/11-Returns at 1 week, after being in 3 layer compression on both legs, both wounds appear to be improving, making good progress, patient is happy, pain is also less especially in the right leg wound 8/Gross; the area on the left anterior lower leg is healed. On the right posterior leg the wound remains although the dimensions are a lot better. 8/25; he arrives in clinic today with a large body of open wound on the left lateral calf. All of the 3 wounds in this area are in close juxtaposition to each other. The story is that we  discharged him last week with no a wrap on the left leg. They went to [place] could not get in as they are only excepting phone orders or online orders for stockings hence they did not put any stocking on the left leg all week. They have something at home but the patient with that was either incapable or just did not put them on. Apparently these opened 1 morning after getting out of bed. The area on the right has no real change 9/1; patient has bilateral lower extremity wounds in the setting of severe chronic venous insufficiency and secondary lymphedema. He arrived last week with new areas on the left lateral lower leg after we did not wrap him and he did not use his stockings. Nevertheless the areas on the left look better today under compression. Posterior right calf does not really changed. We are using Hydrofera Blue on both areas under compression 9/15; bilateral lower extremity wounds in the setting of severe chronic venous insufficiency and secondary lymphedema. He has 20 to 30 mmHg below-knee compression stockings under the eventuality that these close over. We did get the left leg to close but he did not transition to a stocking and this reopened. There are 2 open areas on the left posterior lateral calf and one on the right. Both of these look satisfactory. Using [facility] 9/22; bilateral lower extremity wounds in the  setting of chronic venous insufficiency. 2 superficial areas on the left lateral calf. One on the right just above the Achilles area. We have good edema control we have been using Hydrofera Blue 9/29; the areas on the left lateral calf are healed. On the right just above the Achilles and tendon area things look a lot better small wounds one scabbed area. We have been using Hydrofera Blue. We can discharge him in his own stocking on the left still wrapping on the right. This is the second time we have healed the left leg but he did not put a stocking on last time.  Hopefully this will maintain the edema from chronic venous disease with secondary lymphedema 10/6; he comes in today having a stocking on the left leg. They had trouble getting it on there is a lot of increase in swelling 2 small open areas one anteriorly and one on the medial calf. They report a lot of difficulty getting the stocking on. Paradoxically the area on the right that we have been wrapping posteriorly is closed Electronic Signature(s) Signed: 07/04/2019 5:29:39 PM By: [name] MD Entered By: [name] on 07/04/2019 09:03:06 -------------------------------------------------------------------------------- Physical Exam Details Patient Name: Date of Service: KAMRAN, [name] 07/04/2019 8:00 AM Medical Record [id number] Patient Account Number: [id number] Date of Birth/Sex: Treating RN: [birth year][name] (78 y.o. [name] Primary Care Provider: Hurshel Party Other Clinician: Referring Provider: Treating Provider/Extender:Richardson Dubree, Cheryl Flash, [place] Weeks in Treatment: 9 Constitutional Patient is hypertensive.. Pulse regular and within target range for patient.Marland Kitchen Respirations regular, non-labored and within target range.. Temperature is normal and within the target range for the patient.Marland Kitchen Appears in no distress. Eyes Conjunctivae clear. No discharge.no icterus. Respiratory work of breathing is normal. Cardiovascular Pedal pulses palpable. Increasing edema in the left leg. Integumentary (Hair, Skin) Skin changes of chronic venous insufficiency. No evidence of infection. Psychiatric appears at normal baseline. Notes Wound exam; right posterior lower leg has closed over completely Unfortunately there is increasing swelling on the left leg with small open areas on the anterior upper tibia and one on the medial calf more distally. Electronic Signature(s) Signed: 07/04/2019 5:29:39 PM By: [name] MD Entered By: [name] on 07/04/2019  09:04:40 -------------------------------------------------------------------------------- Physician Orders Details Patient Name: Date of Service: Daniel Pia D. 07/04/2019 8:00 AM Medical Record [id number] Patient Account Number: [id number] Date of Birth/Sex: Treating RN: [name] (78 y.o. [name]) [name] Primary Care Provider: Hurshel Party Other Clinician: Referring Provider: Treating Provider/Extender:Haven Foss, Cheryl Flash, [place] Weeks in Treatment: 45 Verbal / Phone Orders: No Diagnosis Coding ICD-10 Coding Code Description E11.51 Type 2 diabetes mellitus with diabetic peripheral angiopathy without gangrene L97.211 Non-pressure chronic ulcer of right calf limited to breakdown of skin E11.42 Type 2 diabetes mellitus with diabetic polyneuropathy L97.221 Non-pressure chronic ulcer of left calf limited to breakdown of skin I87.333 Chronic venous hypertension (idiopathic) with ulcer and inflammation of bilateral lower extremity Follow-up Appointments Return Appointment in 1 week. Dressing Change Frequency Do not change entire dressing for one week. Skin Barriers/Peri-Wound Care TCA Cream or Ointment Wound Cleansing May shower with protection. Primary Wound Dressing Wound #10 Left,Medial Lower Leg Hydrofera Blue Wound #11 Left,Anterior Lower Leg Hydrofera Blue Wound #3 Right,Posterior Calf Hydrofera Blue Secondary Dressing Wound #10 Left,Medial Lower Leg Dry Gauze ABD pad Wound #11 Left,Anterior Lower Leg Dry Gauze ABD pad Wound #3 Right,Posterior Calf Dry Gauze ABD pad Edema Control Wound #10 Left,Medial Lower Leg 3 Layer Compression System - Bilateral Other: - ketoconazole to right and left ankle  area, patient to obtain lotrimin and apply to right ankle daily Wound #11 Left,Anterior Lower Leg 3 Layer Compression System - Bilateral Other: - ketoconazole to right and left ankle area, patient to obtain lotrimin and apply to right ankle daily Wound #3  Right,Posterior Calf 3 Layer Compression System - Bilateral Other: - ketoconazole to right and left ankle area, patient to obtain lotrimin and apply to right ankle daily Electronic Signature(s) Signed: 07/04/2019 5:29:39 PM By: [name] MD Signed: 07/04/2019 6:04:54 PM By: [name] RN Entered By: [name] on 07/04/2019 08:47:54 -------------------------------------------------------------------------------- Problem List Details Patient Name: Date of Service: Daniel Pia D. 07/04/2019 8:00 AM Medical Record [id number] Patient Account Number: [id number] Date of Birth/Sex: Treating RN: [name] (78 y.o. [name]) [name], [name] Primary Care Provider: Hurshel Party Other Clinician: Referring Provider: Treating Provider/Extender:Tc [place], Cheryl Flash, [place] Weeks in Treatment: Gross Active Problems ICD-10 Evaluated Encounter Code Description Active Date Today Diagnosis E11.51 Type 2 diabetes mellitus with diabetic peripheral 02/28/2019 No Yes angiopathy without gangrene L97.211 Non-pressure chronic ulcer of right calf limited to 02/28/2019 No Yes breakdown of skin E11.42 Type 2 diabetes mellitus with diabetic polyneuropathy 02/28/2019 No Yes L97.221 Non-pressure chronic ulcer of left calf limited to 02/28/2019 No Yes breakdown of skin I87.333 Chronic venous hypertension (idiopathic) with ulcer 02/28/2019 No Yes and inflammation of bilateral lower extremity Inactive Problems Resolved Problems Electronic Signature(s) Signed: 07/04/2019 5:29:39 PM By: [name] MD Entered By: [name] on 07/04/2019 09:01:59 -------------------------------------------------------------------------------- Progress Note Details Patient Name: Date of Service: Daniel Pia D. 07/04/2019 8:00 AM Medical Record [id number] Patient Account Number: [id number] Date of Birth/Sex: Treating RN: [name], [name] (78 y.o. [name]) [name] Primary Care Provider: Hurshel Party Other  Clinician: Referring Provider: Treating Provider/Extender:Sheniece Ruggles, Cheryl Flash, [place] Weeks in Treatment: Gross Subjective History of Present Illness (HPI) The following HPI elements were documented for the patient's wound: Location: Patient presents with a wound to left lower leg. Quality: Patient reports No Pain. Duration: 2 months no cig or alcohol. spontaneous appearance in area of stasis dermamtitis. [name] on metformin only. chronic afib on Coumadin. diabetes and coag studies not good. hba1c 7.5. ivr 4.5. no pain or sxs of systemic disease. hx chf. no intermittent claudication 02/28/2019 Readmission This is a now a 78 year old man who was previously cared for in 2016 by Dr. Lindon Romp for wounds on his lower extremities. At that point he had venous reflux studies although I cannot seem to open these in [place] link. He had arterial studies showing an ABI of 1.11 on the right and 1.27 on the left his waveforms were triphasic bilaterally. He was discharged in stockings although I do not believe he is wearing these in some time. He tells me that about a month ago he noted openings of a large wound on the posterior right calf and 2 smaller areas on the left lateral calf and a small area more recently on the left posterior calf. He has been dressing these with peroxide and triple antibiotic ointment. He is not wearing compression. Past medical history; type 2 diabetes with peripheral neuropathy, chronic venous insufficiency, hypertension, cardiomyopathy, chronic atrial fibrillation on Coumadin, prostate cancer, hyperlipidemia, gout, ABI in our clinic was 1.34 on the left and not obtainable on the right 6/9; this is a patient who has chronic venous insufficiency. He has a fairly substantial area on the right posterior calf, left lateral calf and a small area on the left posterior calf. On arrival last week he had very palpable popliteal and femoral pulses but  nothing in his bilateral feet.  Unfortunately we cannot get arterial studies until July 1 at Dr. Kennon Holter office. They live in [place]. We use silver alginate under Kerlix Coban 6/16; patient with chronic venous insufficiency with wounds on his bilateral lower extremities. When he came into our clinic he was discovered to have a complete absence of peripheral pedal pulses at either the dorsalis pedis or posterior tibial. He does have easily palpable femoral and popliteal pulses. He sees Dr. [name] tomorrow for noninvasive arterial tests. He may also require venous reflux evaluation although I do not view this as an urgent thing. We have been using silver alginate. His wound surfaces of cleaned up quite nicely 6/23; patient with chronic venous insufficiency with wounds on his bilateral lower extremities. His wounds all are somewhat better looking. He did go to Dr. Kennon Holter office but somehow ended up on the doctors schedule rather than being scheduled for noninvasive tests therefore his noninvasive tests are scheduled for July 1. We agree that he has venous insufficiency ulcers but I cannot feel any pulses in his lower extremities dictating the need for test. We are only using Kerlix and light Coban unfortunately this appears to be holding the edema 6/30; has his arterial studies tomorrow. We have been using Kerlix and light Coban will go to a more aggressive compression if the arterial studies will allow. We all agreed these are venous wounds however I cannot feel pulses at either the dorsalis pedis or posterior tibial bilaterally. His wounds generally look some better including left lateral and right posterior. 7/7-Patient returns at 1 week in Kerlix/Coban to both legs, with improvement, in the left lateral and right posterior lower leg wounds, ABI's are normal in both legs per vascular studies, TBI is also normal on both sides, we are using hydrofera blue to the wounds 7/14; patient's arterial studies from 2 weeks ago showed  an ABI on the right at 1.03 with a TBI of 0.86. On the left the ABI was 1.06 with a TBI of 0.84. Notable for the fact that his arterial waveforms were monophasic in all of the lower extremity arteries suggesting some degree of arterial occlusive disease but in general this was felt to be fairly adequate for healing. His compression was increased from 2-3 layer which is appropriate. Dressing was changed to [facility] 7/21; patient's wounds are measuring smaller. The more substantial one on the right posterior calf, second 1 on the left lateral calf. Using [facility] on both wound areas 7/28; patient continues to make nice improvements. The area on the right posterior calf is smaller. Area on the left lateral calf also is smaller. We have been using Hydrofera Blue under compression. The patient will need compression stockings and we have measured him for these in the eventuality that these heal which really should not be too long from now 8/4-Patient continues to make improvement, the right posterior calf area smaller with rim of keratotic skin on one side, the left wound is definitely smaller and improving. 8/11-Returns at 1 week, after being in 3 layer compression on both legs, both wounds appear to be improving, making good progress, patient is happy, pain is also less especially in the right leg wound 8/Gross; the area on the left anterior lower leg is healed. On the right posterior leg the wound remains although the dimensions are a lot better. 8/25; he arrives in clinic today with a large body of open wound on the left lateral calf. All of the 3  wounds in this area are in close juxtaposition to each other. The story is that we discharged him last week with no a wrap on the left leg. They went to [place] could not get in as they are only excepting phone orders or online orders for stockings hence they did not put any stocking on the left leg all week. They have something at home but the  patient with that was either incapable or just did not put them on. Apparently these opened 1 morning after getting out of bed. The area on the right has no real change 9/1; patient has bilateral lower extremity wounds in the setting of severe chronic venous insufficiency and secondary lymphedema. He arrived last week with new areas on the left lateral lower leg after we did not wrap him and he did not use his stockings. Nevertheless the areas on the left look better today under compression. Posterior right calf does not really changed. We are using Hydrofera Blue on both areas under compression 9/15; bilateral lower extremity wounds in the setting of severe chronic venous insufficiency and secondary lymphedema. He has 20 to 30 mmHg below-knee compression stockings under the eventuality that these close over. We did get the left leg to close but he did not transition to a stocking and this reopened. There are 2 open areas on the left posterior lateral calf and one on the right. Both of these look satisfactory. Using [facility] 9/22; bilateral lower extremity wounds in the setting of chronic venous insufficiency. 2 superficial areas on the left lateral calf. One on the right just above the Achilles area. We have good edema control we have been using Hydrofera Blue 9/29; the areas on the left lateral calf are healed. On the right just above the Achilles and tendon area things look a lot better small wounds one scabbed area. We have been using Hydrofera Blue. We can discharge him in his own stocking on the left still wrapping on the right. This is the second time we have healed the left leg but he did not put a stocking on last time. Hopefully this will maintain the edema from chronic venous disease with secondary lymphedema 10/6; he comes in today having a stocking on the left leg. They had trouble getting it on there is a lot of increase in swelling 2 small open areas one anteriorly and one on  the medial calf. They report a lot of difficulty getting the stocking on. ooParadoxically the area on the right that we have been wrapping posteriorly is closed Objective Constitutional Patient is hypertensive.. Pulse regular and within target range for patient.Marland Kitchen Respirations regular, non-labored and within target range.. Temperature is normal and within the target range for the patient.Marland Kitchen Appears in no distress. Vitals Time Taken: 7:55 AM, Height: 74 in, Weight: 212 lbs, BMI: 27.2, Temperature: 97.8 F, Pulse: 89 bpm, Respiratory Rate: Gross breaths/min, Blood Pressure: 155/65 mmHg. Eyes Conjunctivae clear. No discharge.no icterus. Respiratory work of breathing is normal. Cardiovascular Pedal pulses palpable. Increasing edema in the left leg. Psychiatric appears at normal baseline. General Notes: Wound exam; right posterior lower leg has closed over completely ooUnfortunately there is increasing swelling on the left leg with small open areas on the anterior upper tibia and one on the medial calf more distally. Integumentary (Hair, Skin) Skin changes of chronic venous insufficiency. No evidence of infection. Wound #10 status is Open. Original cause of wound was Gradually Appeared. The wound is located on the Left,Medial Lower Leg. The wound  measures 3.2cm length x 1.1cm width x 0.1cm depth; 2.765cm^2 area and 0.276cm^3 volume. There is Fat Layer (Subcutaneous Tissue) Exposed exposed. There is no tunneling or undermining noted. There is a medium amount of serous drainage noted. The wound margin is distinct with the outline attached to the wound base. There is large (67-100%) pink, pale granulation within the wound bed. There is no necrotic tissue within the wound bed. Wound #11 status is Open. Original cause of wound was Gradually Appeared. The wound is located on the Left,Anterior Lower Leg. The wound measures 1.8cm length x 0.9cm width x 0.1cm depth; 1.272cm^2 area and 0.127cm^3 volume.  There is Fat Layer (Subcutaneous Tissue) Exposed exposed. There is no tunneling or undermining noted. There is a medium amount of serous drainage noted. The wound margin is distinct with the outline attached to the wound base. There is medium (34-66%) pink granulation within the wound bed. There is a medium (34-66%) amount of necrotic tissue within the wound bed including Adherent Slough. Wound #3 status is Open. Original cause of wound was Gradually Appeared. The wound is located on the Right,Posterior Calf. The wound measures 0.7cm length x 0.4cm width x 0.1cm depth; 0.22cm^2 area and 0.022cm^3 volume. There is Fat Layer (Subcutaneous Tissue) Exposed exposed. There is no tunneling or undermining noted. There is a medium amount of serosanguineous drainage noted. The wound margin is flat and intact. There is large (67-100%) red granulation within the wound bed. There is no necrotic tissue within the wound bed. 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 There was a Three Layer Compression Therapy Procedure by [name], RN. Post procedure Diagnosis Wound #10: Same as Pre-Procedure Wound #11 Pre-procedure diagnosis of Wound #11 is a Diabetic Wound/Ulcer of the Lower Extremity located on the Left,Anterior Lower Leg . There was a Three Layer Compression Therapy Procedure by [name], RN. Post procedure Diagnosis Wound #11: Same as Pre-Procedure Wound #3 Pre-procedure diagnosis of Wound #3 is a Diabetic Wound/Ulcer  of the Lower Extremity located on the Right,Posterior Calf . There was a Three Layer Compression Therapy Procedure by [name], RN. Post procedure Diagnosis Wound #3: Same as Pre-Procedure Plan Follow-up Appointments: Return Appointment in 1 week. Dressing Change Frequency: Do not change entire dressing for one week. Skin Barriers/Peri-Wound Care: TCA Cream or Ointment Wound Cleansing: May shower with protection. Primary Wound Dressing: Wound #10 Left,Medial Lower Leg: Hydrofera Blue Wound #11 Left,Anterior Lower Leg: Hydrofera Blue Wound #3 Right,Posterior Calf: Hydrofera Blue Secondary Dressing: Wound #10 Left,Medial Lower Leg: Dry Gauze ABD pad Wound #11 Left,Anterior Lower Leg: Dry Gauze ABD pad Wound #3 Right,Posterior Calf: Dry Gauze ABD pad Edema Control: Wound #10 Left,Medial Lower Leg: 3 Layer Compression System - Bilateral Other: - ketoconazole to right and left ankle area, patient to obtain lotrimin and apply to right ankle daily Wound #11 Left,Anterior Lower Leg: 3 Layer Compression System - Bilateral Other: - ketoconazole to right and left ankle area, patient to obtain lotrimin and apply to right ankle daily Wound #3 Right,Posterior Calf: 3 Layer Compression System - Bilateral Other: - ketoconazole to right and left ankle  area, patient to obtain lotrimin and apply to right ankle daily 1. We put Hydrofera Blue on the open areas on the left 2. Put the left leg back in 3 layer compression 3. I also put the right leg which only had a small open area posteriorly back in 3 layer compression. 4. He is clearly failing standard 20/30 below-knee stockings we are going to order him bilateral juxta lite stockings. This is complicated by the fact they do not have a credit card and will have to rely on 1 of their children to provide 1. I emphasized that I cannot continue to wrap him indefinitely if these wounds close over and it is likely this will be by next  week Electronic Signature(s) Signed: 07/04/2019 5:29:39 PM By: [name] MD Entered By: [name] on 07/04/2019 09:06:11 -------------------------------------------------------------------------------- SuperBill Details Patient Name: Date of Service: [name] 07/04/2019 Medical Record [phone] Patient Account Number: [id number] Date of Birth/Sex: Treating RN: Daniel [birth year], [name] (78 y.o. [name]) [name], [name] Primary Care Provider: Hurshel Party Other Clinician: Referring Provider: Treating Provider/Extender:Briar Witherspoon, Cheryl Flash, [place] Weeks in Treatment: Gross Diagnosis Coding ICD-10 Codes Code Description E11.51 Type 2 diabetes mellitus with diabetic peripheral angiopathy without gangrene L97.211 Non-pressure chronic ulcer of right calf limited to breakdown of skin E11.42 Type 2 diabetes mellitus with diabetic polyneuropathy L97.221 Non-pressure chronic ulcer of left calf limited to breakdown of skin I87.333 Chronic venous hypertension (idiopathic) with ulcer and inflammation of bilateral lower extremity Physician Procedures CPT4: Code [id number] Description: 213 - WC PHYS LEVEL 3 - EST PT ICD-10 Diagnosis Description I87.333 Chronic venous hypertension (idiopathic) with ulcer and in lower extremity L97.221 Non-pressure chronic ulcer of left calf limited to breakdo L97.211 Non-pressure chronic  ulcer of right calf limited to breakd Modifier: flammation of bil wn of skin own of skin Quantity: 1 ateral Electronic Signature(s) Signed: 07/04/2019 5:29:39 PM By: [name] MD Entered By: [name] on 07/04/2019 09:06:32

## 2019-07-05 ENCOUNTER — Other Ambulatory Visit (INDEPENDENT_AMBULATORY_CARE_PROVIDER_SITE_OTHER): Payer: Self-pay | Admitting: Internal Medicine

## 2019-07-11 ENCOUNTER — Other Ambulatory Visit: Payer: Self-pay

## 2019-07-11 ENCOUNTER — Encounter (HOSPITAL_BASED_OUTPATIENT_CLINIC_OR_DEPARTMENT_OTHER): Payer: Medicare HMO | Admitting: Internal Medicine

## 2019-07-11 DIAGNOSIS — E1151 Type 2 diabetes mellitus with diabetic peripheral angiopathy without gangrene: Secondary | ICD-10-CM | POA: Diagnosis not present

## 2019-07-11 DIAGNOSIS — Z923 Personal history of irradiation: Secondary | ICD-10-CM | POA: Diagnosis not present

## 2019-07-11 DIAGNOSIS — I10 Essential (primary) hypertension: Secondary | ICD-10-CM | POA: Diagnosis not present

## 2019-07-11 DIAGNOSIS — I87312 Chronic venous hypertension (idiopathic) with ulcer of left lower extremity: Secondary | ICD-10-CM | POA: Diagnosis not present

## 2019-07-11 DIAGNOSIS — L97822 Non-pressure chronic ulcer of other part of left lower leg with fat layer exposed: Secondary | ICD-10-CM | POA: Diagnosis not present

## 2019-07-11 DIAGNOSIS — L97212 Non-pressure chronic ulcer of right calf with fat layer exposed: Secondary | ICD-10-CM | POA: Diagnosis not present

## 2019-07-11 DIAGNOSIS — E1142 Type 2 diabetes mellitus with diabetic polyneuropathy: Secondary | ICD-10-CM | POA: Diagnosis not present

## 2019-07-11 DIAGNOSIS — I87333 Chronic venous hypertension (idiopathic) with ulcer and inflammation of bilateral lower extremity: Secondary | ICD-10-CM | POA: Diagnosis not present

## 2019-07-11 DIAGNOSIS — L97211 Non-pressure chronic ulcer of right calf limited to breakdown of skin: Secondary | ICD-10-CM | POA: Diagnosis not present

## 2019-07-11 DIAGNOSIS — E11622 Type 2 diabetes mellitus with other skin ulcer: Secondary | ICD-10-CM | POA: Diagnosis not present

## 2019-07-18 ENCOUNTER — Encounter (HOSPITAL_BASED_OUTPATIENT_CLINIC_OR_DEPARTMENT_OTHER): Payer: Medicare HMO | Admitting: Internal Medicine

## 2019-07-18 ENCOUNTER — Other Ambulatory Visit: Payer: Self-pay

## 2019-07-18 DIAGNOSIS — L97822 Non-pressure chronic ulcer of other part of left lower leg with fat layer exposed: Secondary | ICD-10-CM | POA: Diagnosis not present

## 2019-07-18 DIAGNOSIS — E1151 Type 2 diabetes mellitus with diabetic peripheral angiopathy without gangrene: Secondary | ICD-10-CM | POA: Diagnosis not present

## 2019-07-18 DIAGNOSIS — I87333 Chronic venous hypertension (idiopathic) with ulcer and inflammation of bilateral lower extremity: Secondary | ICD-10-CM | POA: Diagnosis not present

## 2019-07-18 DIAGNOSIS — L97222 Non-pressure chronic ulcer of left calf with fat layer exposed: Secondary | ICD-10-CM | POA: Diagnosis not present

## 2019-07-18 DIAGNOSIS — E1142 Type 2 diabetes mellitus with diabetic polyneuropathy: Secondary | ICD-10-CM | POA: Diagnosis not present

## 2019-07-18 DIAGNOSIS — I10 Essential (primary) hypertension: Secondary | ICD-10-CM | POA: Diagnosis not present

## 2019-07-18 DIAGNOSIS — I87312 Chronic venous hypertension (idiopathic) with ulcer of left lower extremity: Secondary | ICD-10-CM | POA: Diagnosis not present

## 2019-07-18 DIAGNOSIS — L97212 Non-pressure chronic ulcer of right calf with fat layer exposed: Secondary | ICD-10-CM | POA: Diagnosis not present

## 2019-07-18 DIAGNOSIS — L97829 Non-pressure chronic ulcer of other part of left lower leg with unspecified severity: Secondary | ICD-10-CM | POA: Diagnosis not present

## 2019-07-18 DIAGNOSIS — E11622 Type 2 diabetes mellitus with other skin ulcer: Secondary | ICD-10-CM | POA: Diagnosis not present

## 2019-07-18 DIAGNOSIS — Z923 Personal history of irradiation: Secondary | ICD-10-CM | POA: Diagnosis not present

## 2019-07-25 ENCOUNTER — Encounter (HOSPITAL_BASED_OUTPATIENT_CLINIC_OR_DEPARTMENT_OTHER): Payer: Medicare HMO | Admitting: Internal Medicine

## 2019-07-25 ENCOUNTER — Other Ambulatory Visit: Payer: Self-pay

## 2019-07-25 DIAGNOSIS — E1151 Type 2 diabetes mellitus with diabetic peripheral angiopathy without gangrene: Secondary | ICD-10-CM | POA: Diagnosis not present

## 2019-07-25 DIAGNOSIS — I10 Essential (primary) hypertension: Secondary | ICD-10-CM | POA: Diagnosis not present

## 2019-07-25 DIAGNOSIS — S81801A Unspecified open wound, right lower leg, initial encounter: Secondary | ICD-10-CM | POA: Diagnosis not present

## 2019-07-25 DIAGNOSIS — L97822 Non-pressure chronic ulcer of other part of left lower leg with fat layer exposed: Secondary | ICD-10-CM | POA: Diagnosis not present

## 2019-07-25 DIAGNOSIS — E1142 Type 2 diabetes mellitus with diabetic polyneuropathy: Secondary | ICD-10-CM | POA: Diagnosis not present

## 2019-07-25 DIAGNOSIS — L97212 Non-pressure chronic ulcer of right calf with fat layer exposed: Secondary | ICD-10-CM | POA: Diagnosis not present

## 2019-07-25 DIAGNOSIS — Z923 Personal history of irradiation: Secondary | ICD-10-CM | POA: Diagnosis not present

## 2019-07-25 DIAGNOSIS — E11622 Type 2 diabetes mellitus with other skin ulcer: Secondary | ICD-10-CM | POA: Diagnosis not present

## 2019-07-25 DIAGNOSIS — L97222 Non-pressure chronic ulcer of left calf with fat layer exposed: Secondary | ICD-10-CM | POA: Diagnosis not present

## 2019-07-25 DIAGNOSIS — I87312 Chronic venous hypertension (idiopathic) with ulcer of left lower extremity: Secondary | ICD-10-CM | POA: Diagnosis not present

## 2019-07-25 DIAGNOSIS — I87333 Chronic venous hypertension (idiopathic) with ulcer and inflammation of bilateral lower extremity: Secondary | ICD-10-CM | POA: Diagnosis not present

## 2019-08-01 ENCOUNTER — Other Ambulatory Visit: Payer: Self-pay

## 2019-08-01 ENCOUNTER — Encounter (HOSPITAL_BASED_OUTPATIENT_CLINIC_OR_DEPARTMENT_OTHER): Payer: Medicare HMO | Attending: Internal Medicine | Admitting: Internal Medicine

## 2019-08-01 DIAGNOSIS — E1142 Type 2 diabetes mellitus with diabetic polyneuropathy: Secondary | ICD-10-CM | POA: Diagnosis not present

## 2019-08-01 DIAGNOSIS — L97212 Non-pressure chronic ulcer of right calf with fat layer exposed: Secondary | ICD-10-CM | POA: Diagnosis not present

## 2019-08-01 DIAGNOSIS — L97211 Non-pressure chronic ulcer of right calf limited to breakdown of skin: Secondary | ICD-10-CM | POA: Diagnosis not present

## 2019-08-01 DIAGNOSIS — E1151 Type 2 diabetes mellitus with diabetic peripheral angiopathy without gangrene: Secondary | ICD-10-CM | POA: Diagnosis not present

## 2019-08-01 DIAGNOSIS — E11622 Type 2 diabetes mellitus with other skin ulcer: Secondary | ICD-10-CM | POA: Diagnosis not present

## 2019-08-01 DIAGNOSIS — L97222 Non-pressure chronic ulcer of left calf with fat layer exposed: Secondary | ICD-10-CM | POA: Diagnosis not present

## 2019-08-01 DIAGNOSIS — I87333 Chronic venous hypertension (idiopathic) with ulcer and inflammation of bilateral lower extremity: Secondary | ICD-10-CM | POA: Insufficient documentation

## 2019-08-01 DIAGNOSIS — I87313 Chronic venous hypertension (idiopathic) with ulcer of bilateral lower extremity: Secondary | ICD-10-CM | POA: Diagnosis not present

## 2019-08-01 DIAGNOSIS — L97221 Non-pressure chronic ulcer of left calf limited to breakdown of skin: Secondary | ICD-10-CM | POA: Diagnosis not present

## 2019-08-08 ENCOUNTER — Other Ambulatory Visit: Payer: Self-pay

## 2019-08-08 ENCOUNTER — Encounter (HOSPITAL_BASED_OUTPATIENT_CLINIC_OR_DEPARTMENT_OTHER): Payer: Medicare HMO | Admitting: Internal Medicine

## 2019-08-08 DIAGNOSIS — E1142 Type 2 diabetes mellitus with diabetic polyneuropathy: Secondary | ICD-10-CM | POA: Diagnosis not present

## 2019-08-08 DIAGNOSIS — I87333 Chronic venous hypertension (idiopathic) with ulcer and inflammation of bilateral lower extremity: Secondary | ICD-10-CM | POA: Diagnosis not present

## 2019-08-08 DIAGNOSIS — E11622 Type 2 diabetes mellitus with other skin ulcer: Secondary | ICD-10-CM | POA: Diagnosis not present

## 2019-08-08 DIAGNOSIS — L97329 Non-pressure chronic ulcer of left ankle with unspecified severity: Secondary | ICD-10-CM | POA: Diagnosis not present

## 2019-08-08 DIAGNOSIS — E1151 Type 2 diabetes mellitus with diabetic peripheral angiopathy without gangrene: Secondary | ICD-10-CM | POA: Diagnosis not present

## 2019-08-08 DIAGNOSIS — I87312 Chronic venous hypertension (idiopathic) with ulcer of left lower extremity: Secondary | ICD-10-CM | POA: Diagnosis not present

## 2019-08-08 DIAGNOSIS — L97221 Non-pressure chronic ulcer of left calf limited to breakdown of skin: Secondary | ICD-10-CM | POA: Diagnosis not present

## 2019-08-08 DIAGNOSIS — L97212 Non-pressure chronic ulcer of right calf with fat layer exposed: Secondary | ICD-10-CM | POA: Diagnosis not present

## 2019-08-08 DIAGNOSIS — L97211 Non-pressure chronic ulcer of right calf limited to breakdown of skin: Secondary | ICD-10-CM | POA: Diagnosis not present

## 2019-08-15 ENCOUNTER — Encounter (HOSPITAL_BASED_OUTPATIENT_CLINIC_OR_DEPARTMENT_OTHER): Payer: Medicare HMO | Admitting: Internal Medicine

## 2019-08-15 ENCOUNTER — Other Ambulatory Visit: Payer: Self-pay

## 2019-08-15 DIAGNOSIS — E11622 Type 2 diabetes mellitus with other skin ulcer: Secondary | ICD-10-CM | POA: Diagnosis not present

## 2019-08-15 DIAGNOSIS — L97211 Non-pressure chronic ulcer of right calf limited to breakdown of skin: Secondary | ICD-10-CM | POA: Diagnosis not present

## 2019-08-15 DIAGNOSIS — E1151 Type 2 diabetes mellitus with diabetic peripheral angiopathy without gangrene: Secondary | ICD-10-CM | POA: Diagnosis not present

## 2019-08-15 DIAGNOSIS — I87312 Chronic venous hypertension (idiopathic) with ulcer of left lower extremity: Secondary | ICD-10-CM | POA: Diagnosis not present

## 2019-08-15 DIAGNOSIS — I87333 Chronic venous hypertension (idiopathic) with ulcer and inflammation of bilateral lower extremity: Secondary | ICD-10-CM | POA: Diagnosis not present

## 2019-08-15 DIAGNOSIS — L97222 Non-pressure chronic ulcer of left calf with fat layer exposed: Secondary | ICD-10-CM | POA: Diagnosis not present

## 2019-08-15 DIAGNOSIS — L97221 Non-pressure chronic ulcer of left calf limited to breakdown of skin: Secondary | ICD-10-CM | POA: Diagnosis not present

## 2019-08-15 DIAGNOSIS — E1142 Type 2 diabetes mellitus with diabetic polyneuropathy: Secondary | ICD-10-CM | POA: Diagnosis not present

## 2019-08-15 DIAGNOSIS — L97212 Non-pressure chronic ulcer of right calf with fat layer exposed: Secondary | ICD-10-CM | POA: Diagnosis not present

## 2019-08-22 ENCOUNTER — Encounter (HOSPITAL_BASED_OUTPATIENT_CLINIC_OR_DEPARTMENT_OTHER): Payer: Medicare HMO

## 2019-08-22 ENCOUNTER — Other Ambulatory Visit: Payer: Self-pay

## 2019-08-22 DIAGNOSIS — E11622 Type 2 diabetes mellitus with other skin ulcer: Secondary | ICD-10-CM | POA: Diagnosis not present

## 2019-08-22 DIAGNOSIS — L97211 Non-pressure chronic ulcer of right calf limited to breakdown of skin: Secondary | ICD-10-CM | POA: Diagnosis not present

## 2019-08-22 DIAGNOSIS — E1142 Type 2 diabetes mellitus with diabetic polyneuropathy: Secondary | ICD-10-CM | POA: Diagnosis not present

## 2019-08-22 DIAGNOSIS — I87333 Chronic venous hypertension (idiopathic) with ulcer and inflammation of bilateral lower extremity: Secondary | ICD-10-CM | POA: Diagnosis not present

## 2019-08-22 DIAGNOSIS — E1151 Type 2 diabetes mellitus with diabetic peripheral angiopathy without gangrene: Secondary | ICD-10-CM | POA: Diagnosis not present

## 2019-08-22 DIAGNOSIS — L97221 Non-pressure chronic ulcer of left calf limited to breakdown of skin: Secondary | ICD-10-CM | POA: Diagnosis not present

## 2019-08-22 NOTE — Progress Notes (Signed)
[name], [name] ([id number]) Visit Report for 08/22/2019 SuperBill Details Patient Name: Date of Service: [name], [name] 08/22/2019 Medical Record [id number] Patient Account Number: [id number] Date of Birth/Sex: Treating RN: 19-Nov-1940 (78 y.o. [name] Primary Care Provider: Hurshel Party Other Clinician: Referring Provider: Treating Provider/Extender:Stone III, Deno Etienne, [place] Weeks in Treatment: 25 Diagnosis Coding ICD-10 Codes Code Description E11.51 Type 2 diabetes mellitus with diabetic peripheral angiopathy without gangrene L97.211 Non-pressure chronic ulcer of right calf limited to breakdown of skin E11.42 Type 2 diabetes mellitus with diabetic polyneuropathy L97.221 Non-pressure chronic ulcer of left calf limited to breakdown of skin I87.333 Chronic venous hypertension (idiopathic) with ulcer and inflammation of bilateral lower extremity Facility Procedures The patient participates with Medicare or their insurance follows the Medicare Facility Guidelines CPT4 Description Modifier Quantity Code [id number] BILATERAL: Application of multi-layer venous compression system; leg 1 (below knee), including ankle and foot. Electronic Signature(s) Signed: 08/22/2019 1:35:50 PM By: Baruch Gouty RN, BSN Signed: 08/22/2019 10:29:27 PM By: Worthy Keeler PA-C Entered By: Baruch Gouty on 08/22/2019 11:02:53

## 2019-08-22 NOTE — Progress Notes (Addendum)
CURRY, CONK ([id number]) Visit Report for 08/22/2019 Arrival Information Details Patient Name: Date of Service: IAIN, ZAKOWSKI 08/22/2019 11:15 AM Medical Record [place] Patient Account Number: [id number] Date of Birth/Sex: Treating RN: Jan 27, 1941 (78 y.o. [name] Primary Care Loran Fleet: Hurshel Party Other Clinician: Referring Brylan Dec: Treating Aleigha Gilani/Extender:Stone III, Deno Etienne, [place] Weeks in Treatment: 25 Visit Information History Since Last Visit Added or deleted any medications: No Patient Arrived: Ambulatory Any new allergies or adverse reactions: No Arrival Time: 10:41 Had a fall or experienced change in No Accompanied By: spouse activities of daily living that may affect Transfer Assistance: None risk of falls: Patient Identification Verified: Yes Signs or symptoms of abuse/neglect since last No Secondary Verification Process Yes visito Completed: Hospitalized since last visit: No Patient Requires Transmission- No Implantable device outside of the clinic excluding No Based Precautions: cellular tissue based products placed in the center Patient Has Alerts: Yes since last visit: Patient Alerts: Patient on Blood Has Dressing in Place as Prescribed: Yes Thinner Has Compression in Place as Prescribed: Yes Pain Present Now: No Electronic Signature(s) Signed: 08/22/2019 1:35:50 PM By: Baruch Gouty RN, BSN Entered By: Baruch Gouty on 08/22/2019 10:45:59 -------------------------------------------------------------------------------- Compression Therapy Details Patient Name: Date of Service: [name] 08/22/2019 11:15 AM Medical Record [id number] Patient Account Number: [id number] Date of Birth/Sex: Treating RN: 1940-11-02 (78 y.o. [name] Primary Care Hoke Baer: Hurshel Party Other Clinician: Referring Quinteria Chisum: Treating Dyane Broberg/Extender:Stone III, Deno Etienne, [place] Weeks in Treatment:  25 Compression Therapy Performed for Wound Wound #12 Left,Lateral Lower Leg Assessment: Performed By: Clinician Baruch Gouty, RN Compression Type: Three Layer Pre Treatment ABI: 1.3 Electronic Signature(s) Signed: 08/22/2019 1:35:50 PM By: Baruch Gouty RN, BSN Entered By: Baruch Gouty on 08/22/2019 11:00:21 -------------------------------------------------------------------------------- Compression Therapy Details Patient Name: Date of Service: BENEDIKT, LITAKER 08/22/2019 11:15 AM Medical Record [id number] Patient Account Number: [id number] Date of Birth/Sex: Treating RN: 1941-01-16 (78 y.o. [name] Primary Care Amman Bartel: Hurshel Party Other Clinician: Referring Faye Strohman: Treating Rawleigh Rode/Extender:Stone III, Deno Etienne, [place] Weeks in Treatment: 25 Compression Therapy Performed for Wound Wound #3R Right,Posterior Calf Assessment: Performed By: Clinician Baruch Gouty, RN Compression Type: Three Layer Pre Treatment ABI: 1.3 Electronic Signature(s) Signed: 08/22/2019 1:35:50 PM By: Baruch Gouty RN, BSN Entered By: Baruch Gouty on 08/22/2019 11:00:22 -------------------------------------------------------------------------------- Encounter Discharge Information Details Patient Name: Date of Service: [name] 08/22/2019 11:15 AM Medical Record [id number] Patient Account Number: [id number] Date of Birth/Sex: Treating RN: [birth year] (78 y.o. [name] Primary Care Yeilyn Gent: [facility]hel Party Other Clinician: Referring Mickenzie Stolar: Treating Helix Lafontaine/Extender:Stone III, Deno Etienne, [place] Weeks in Treatment: 25 Encounter Discharge Information Items Discharge Condition: Stable Ambulatory Status: Ambulatory Discharge Destination: Home Transportation: Private Auto Accompanied By: self Schedule Follow-up Appointment: Yes Clinical Summary of Care: Electronic Signature(s) Signed: 08/22/2019 1:35:50 PM By: Baruch Gouty RN, BSN Entered By: Baruch Gouty on 08/22/2019 11:02:46 -------------------------------------------------------------------------------- Patient/Caregiver Education Details Patient Name: Armando Gang 11/24/2020andnbsp11:15 Date of Service: AM Medical Record [id number] Number: Patient Account Number: [id number] Treating RN: July 08, 1941 (78 y.o. Baruch Gouty Date of Birth/Gender: M) Other Clinician: Primary Care Treating Hurshel Party Worthy Keeler Physician: Physician/Extender: Referring Physician: Rosina Lowenstein in Treatment: 25 Education Assessment Education Provided To: Patient Education Topics Provided Wound/Skin Impairment: Handouts: Skin Care Do's and Dont's Methods: Explain/Verbal Responses: Reinforcements needed Electronic Signature(s) Signed: 08/22/2019 1:35:50 PM By: Baruch Gouty RN, BSN Entered By: Baruch Gouty on 08/22/2019 11:02:06 -------------------------------------------------------------------------------- Wound Assessment Details Patient Name: Date of Service: [name] 08/22/2019 11:15 AM Medical  Record [id number] Patient Account Number: [id number] Date of Birth/Sex: Treating RN: 16-Jul-1941 (78 y.o. [name] Primary Care Elisea Khader: H[facility]hel Party Other Clinician: Referring Milbern Doescher: Treating Ebon Ketchum/Extender:Stone III, Deno Etienne, [place] Weeks in Treatment: 25 Wound Status Wound Number: 12 Primary Etiology: Venous Leg Ulcer Wound Location: Left, Lateral Lower Leg Wound Status: Open Wounding Event: Blister Date Acquired: 08/06/2019 Weeks Of Treatment: 2 Clustered Wound: No Wound Measurements Length: (cm) 0.1 Width: (cm) 0.1 Depth: (cm) 0.1 Area: (cm) 0.008 Volume: (cm) 0.001 Wound Description Full Thickness Without Exposed Suppo Classification: Structures % Reduction in Area: 98.9% % Reduction in Volume: 98.6% rt Electronic Signature(s) Signed: 08/22/2019 1:35:50 PM By:  Baruch Gouty RN, BSN Entered By: Baruch Gouty on 08/22/2019 10:58:49 -------------------------------------------------------------------------------- Wound Assessment Details Patient Name: Date of Service: [name] 08/22/2019 11:15 AM Medical Record [id number] Patient Account Number: [id number] Date of Birth/Sex: Treating RN: 09/24/1941 (78 y.o. [name] Primary Care Makaiya Geerdes: H[facility]hel Party Other Clinician: Referring Ziya Coonrod: Treating Shaddai Shapley/Extender:Stone III, Deno Etienne, [place] Weeks in Treatment: 25 Wound Status Wound Number: 13 Primary Diabetic Wound/Ulcer of the Lower Extremity Etiology: Wound Location: Left Toe Second Wound Open Wounding Event: Gradually Appeared Status: Date Acquired: 08/22/2019 Comorbid Cataracts, Hypertension, Peripheral Venous Weeks Of Treatment: 0 History: Disease, Type II Diabetes, Gout, Received Clustered Wound: No Radiation Photos Wound Measurements Length: (cm) 1.4 Width: (cm) 2.8 Depth: (cm) 0.1 Area: (cm) 3.079 Volume: (cm) 0.308 Wound Description Classification: Grade 1 Wound Margin: Distinct, outline attached Exudate Amount: Medium Exudate Type: Serosanguineous Exudate Color: red, brown Wound Bed Granulation Amount: Large (67-100%) Granulation Quality: Red, Pink Necrotic Amount: None Present (0%) Foul Odor After Cleansing: No Slough/Fibrino No Exposed Structure Fascia Exposed: N Fat Layer (Subcutaneous Tissue) Exposed: N Tendon Exposed: N Muscle Exposed: N Joint Exposed: N Bone Exposed: N % Reduction in Area: 0% % Reduction in Volume: 0% Epithelialization: Small (1-33%) Tunneling: No o o o o o o Electronic Signature(s) Signed: 08/28/2019 4:19:23 PM By: Mikeal Hawthorne EMT/HBOT Signed: 08/31/2019 10:53:45 AM By: Baruch Gouty RN, BSN Previous Signature: 08/22/2019 1:35:50 PM Version By: Baruch Gouty RN, BSN Entered By: Mikeal Hawthorne on 08/28/2019  11:05:02 -------------------------------------------------------------------------------- Wound Assessment Details Patient Name: Date of Service: WENSLEY, BARRELLA 08/22/2019 11:15 AM Medical Record [id number] Patient Account Number: [id number] Date of Birth/Sex: Treating RN: 04-30-1941 (78 y.o. [name] Primary Care Pat Sires: H[facility]hel Party Other Clinician: Referring Jamisha Hoeschen: Treating Deshun Sedivy/Extender:Stone III, Deno Etienne, [place] Weeks in Treatment: 25 Wound Status Wound Number: 14 Primary Diabetic Wound/Ulcer of the Lower Extremity Etiology: Wound Location: Left Toe Third Wound Open Wounding Event: Gradually Appeared Status: Date Acquired: 08/22/2019 Comorbid Cataracts, Hypertension, Peripheral Venous Weeks Of Treatment: 0 History: Disease, Type II Diabetes, Gout, Received Clustered Wound: No Radiation Photos Wound Measurements Length: (cm) 1.8 % Reductio Width: (cm) 1 % Reductio Depth: (cm) 0.1 Epithelial Area: (cm) 1.414 Tunneling Volume: (cm) 0.141 Undermini Wound Description Classification: Grade 1 Foul Odor Wound Margin: Distinct, outline attached Slough/Fi Exudate Amount: Medium Exudate Type: Serosanguineous Exudate Color: red, brown Wound Bed Granulation Amount: Large (67-100%) Granulation Quality: Red, Pink Fascia Exp Necrotic Amount: None Present (0%) Fat Layer Tendon Exp Muscle Exp Joint Expo Bone Expos After Cleansing: No brino No Exposed Structure osed: No (Subcutaneous Tissue) Exposed: No osed: No osed: No sed: No ed: No n in Area: 0% n in Volume: 0% ization: Small (1-33%) : No ng: No Electronic Signature(s) Signed: 08/28/2019 4:19:23 PM By: Mikeal Hawthorne EMT/HBOT Signed: 08/31/2019 10:53:45 AM By: Baruch Gouty RN, BSN Previous Signature: 08/22/2019 1:35:50  PM Version By: Baruch Gouty RN, BSN Entered By: Mikeal Hawthorne on 08/28/2019  11:04:34 -------------------------------------------------------------------------------- Wound Assessment Details Patient Name: Date of Service: OREE, TOMAINO 08/22/2019 11:15 AM Medical Record [id number] Patient Account Number: [id number] Date of Birth/Sex: Treating RN: 26-Jul-1941 (78 y.o. [name] Primary Care Lelar Farewell: Hurshel Party Other Clinician: Referring Landynn Dupler: Treating Luvena Wentling/Extender:Stone III, Deno Etienne, [place] Weeks in Treatment: 25 Wound Status Wound Number: 3R Primary Diabetic Wound/Ulcer of the Lower Etiology: Extremity Wound Location: Right, Posterior Calf Wound Status: Open Wounding Event: Gradually Appeared Date Acquired: 02/28/2019 Weeks Of Treatment: 25 Clustered Wound: No Wound Measurements Length: (cm) 1.8 Width: (cm) 2.1 Depth: (cm) 0.1 Area: (cm) 2.969 Volume: (cm) 0.297 Wound Description Classification: Grade 2 % Reduction in Area: 87.9% % Reduction in Volume: 87.9% Electronic Signature(s) Signed: 08/22/2019 1:35:50 PM By: Baruch Gouty RN, BSN Entered By: Baruch Gouty on 08/22/2019 10:56:51 -------------------------------------------------------------------------------- Vitals Details Patient Name: Date of Service: [name] 08/22/2019 11:15 AM Medical Record [id number] Patient Account Number: [id number] Date of Birth/Sex: Treating RN: 1941-02-16 (78 y.o. [name] Primary Care Aniyla Harling: Hurshel Party Other Clinician: Referring Conor Filsaime: Treating Aamira Bischoff/Extender:Stone III, Deno Etienne, [place] Weeks in Treatment: 25 Vital Signs Time Taken: 10:46 Temperature (F): 97.6 Height (in): 74 Pulse (bpm): 102 Source: Stated Respiratory Rate (breaths/min): 18 Weight (lbs): 212 Blood Pressure (mmHg): 158/96 Source: Stated Reference Range: 80 - 120 mg / dl Body Mass Index (BMI): 27.2 Electronic Signature(s) Signed: 08/22/2019 1:35:50 PM By: Baruch Gouty RN, BSN Entered By:  Baruch Gouty on 08/22/2019 10:46:22

## 2019-08-29 ENCOUNTER — Other Ambulatory Visit: Payer: Self-pay

## 2019-08-29 ENCOUNTER — Encounter (HOSPITAL_BASED_OUTPATIENT_CLINIC_OR_DEPARTMENT_OTHER): Payer: Medicare HMO | Attending: Internal Medicine | Admitting: Internal Medicine

## 2019-08-29 DIAGNOSIS — I872 Venous insufficiency (chronic) (peripheral): Secondary | ICD-10-CM | POA: Insufficient documentation

## 2019-08-29 DIAGNOSIS — L97512 Non-pressure chronic ulcer of other part of right foot with fat layer exposed: Secondary | ICD-10-CM | POA: Diagnosis not present

## 2019-08-29 DIAGNOSIS — L97521 Non-pressure chronic ulcer of other part of left foot limited to breakdown of skin: Secondary | ICD-10-CM | POA: Insufficient documentation

## 2019-08-29 DIAGNOSIS — I110 Hypertensive heart disease with heart failure: Secondary | ICD-10-CM | POA: Diagnosis not present

## 2019-08-29 DIAGNOSIS — E1142 Type 2 diabetes mellitus with diabetic polyneuropathy: Secondary | ICD-10-CM | POA: Diagnosis not present

## 2019-08-29 DIAGNOSIS — L97211 Non-pressure chronic ulcer of right calf limited to breakdown of skin: Secondary | ICD-10-CM | POA: Diagnosis not present

## 2019-08-29 DIAGNOSIS — E1151 Type 2 diabetes mellitus with diabetic peripheral angiopathy without gangrene: Secondary | ICD-10-CM | POA: Diagnosis not present

## 2019-08-29 DIAGNOSIS — I482 Chronic atrial fibrillation, unspecified: Secondary | ICD-10-CM | POA: Diagnosis not present

## 2019-08-29 DIAGNOSIS — Z7984 Long term (current) use of oral hypoglycemic drugs: Secondary | ICD-10-CM | POA: Diagnosis not present

## 2019-08-29 DIAGNOSIS — Z8546 Personal history of malignant neoplasm of prostate: Secondary | ICD-10-CM | POA: Diagnosis not present

## 2019-08-29 DIAGNOSIS — E11621 Type 2 diabetes mellitus with foot ulcer: Secondary | ICD-10-CM | POA: Insufficient documentation

## 2019-08-29 DIAGNOSIS — I429 Cardiomyopathy, unspecified: Secondary | ICD-10-CM | POA: Diagnosis not present

## 2019-08-29 DIAGNOSIS — L97212 Non-pressure chronic ulcer of right calf with fat layer exposed: Secondary | ICD-10-CM | POA: Diagnosis not present

## 2019-08-29 DIAGNOSIS — E11622 Type 2 diabetes mellitus with other skin ulcer: Secondary | ICD-10-CM | POA: Diagnosis not present

## 2019-08-29 DIAGNOSIS — I87333 Chronic venous hypertension (idiopathic) with ulcer and inflammation of bilateral lower extremity: Secondary | ICD-10-CM | POA: Diagnosis not present

## 2019-08-29 DIAGNOSIS — Z7901 Long term (current) use of anticoagulants: Secondary | ICD-10-CM | POA: Insufficient documentation

## 2019-08-29 DIAGNOSIS — I890 Lymphedema, not elsewhere classified: Secondary | ICD-10-CM | POA: Insufficient documentation

## 2019-08-29 DIAGNOSIS — L97522 Non-pressure chronic ulcer of other part of left foot with fat layer exposed: Secondary | ICD-10-CM | POA: Insufficient documentation

## 2019-08-29 DIAGNOSIS — L97222 Non-pressure chronic ulcer of left calf with fat layer exposed: Secondary | ICD-10-CM | POA: Diagnosis not present

## 2019-09-01 ENCOUNTER — Other Ambulatory Visit (INDEPENDENT_AMBULATORY_CARE_PROVIDER_SITE_OTHER): Payer: Self-pay | Admitting: Internal Medicine

## 2019-09-05 ENCOUNTER — Encounter (HOSPITAL_BASED_OUTPATIENT_CLINIC_OR_DEPARTMENT_OTHER): Payer: Medicare HMO | Admitting: Internal Medicine

## 2019-09-05 ENCOUNTER — Ambulatory Visit (INDEPENDENT_AMBULATORY_CARE_PROVIDER_SITE_OTHER): Payer: Medicare HMO | Admitting: Nurse Practitioner

## 2019-09-05 ENCOUNTER — Other Ambulatory Visit: Payer: Self-pay

## 2019-09-05 DIAGNOSIS — E11621 Type 2 diabetes mellitus with foot ulcer: Secondary | ICD-10-CM | POA: Diagnosis not present

## 2019-09-05 DIAGNOSIS — L97521 Non-pressure chronic ulcer of other part of left foot limited to breakdown of skin: Secondary | ICD-10-CM | POA: Diagnosis not present

## 2019-09-05 DIAGNOSIS — L97529 Non-pressure chronic ulcer of other part of left foot with unspecified severity: Secondary | ICD-10-CM | POA: Diagnosis not present

## 2019-09-05 DIAGNOSIS — I87313 Chronic venous hypertension (idiopathic) with ulcer of bilateral lower extremity: Secondary | ICD-10-CM | POA: Diagnosis not present

## 2019-09-05 DIAGNOSIS — E11622 Type 2 diabetes mellitus with other skin ulcer: Secondary | ICD-10-CM | POA: Diagnosis not present

## 2019-09-05 DIAGNOSIS — I87333 Chronic venous hypertension (idiopathic) with ulcer and inflammation of bilateral lower extremity: Secondary | ICD-10-CM | POA: Diagnosis not present

## 2019-09-05 DIAGNOSIS — L97522 Non-pressure chronic ulcer of other part of left foot with fat layer exposed: Secondary | ICD-10-CM | POA: Diagnosis not present

## 2019-09-05 DIAGNOSIS — I890 Lymphedema, not elsewhere classified: Secondary | ICD-10-CM | POA: Diagnosis not present

## 2019-09-05 DIAGNOSIS — L97211 Non-pressure chronic ulcer of right calf limited to breakdown of skin: Secondary | ICD-10-CM | POA: Diagnosis not present

## 2019-09-05 DIAGNOSIS — L97222 Non-pressure chronic ulcer of left calf with fat layer exposed: Secondary | ICD-10-CM | POA: Diagnosis not present

## 2019-09-05 DIAGNOSIS — L97512 Non-pressure chronic ulcer of other part of right foot with fat layer exposed: Secondary | ICD-10-CM | POA: Diagnosis not present

## 2019-09-05 NOTE — Progress Notes (Signed)
SPIROS, RUTIGLIANO ([id number]) Visit Report for 08/15/2019 HPI Details Patient Name: Date of Service: [name], [name] 08/15/2019 8:00 AM Medical Record [place] Patient Account Number: [id number] Date of Birth/Sex: Treating RN: 07/14/1941 (78 y.o. [name]) [name] Primary Care Provider: Hurshel Party Other Clinician: Referring Provider: Treating Provider/Extender:, Cheryl Flash, [place] Weeks in Treatment: 24 History of Present Illness Location: Patient presents with a wound to left lower leg. Quality: Patient reports No Pain. Duration: 2 months HPI Description: no cig or alcohol. spontaneous appearance in area of stasis dermamtitis. [name] on metformin only. chronic afib on Coumadin. diabetes and coag studies not good. hba1c 7.5. ivr 4.5. no pain or sxs of systemic disease. hx chf. no intermittent claudication 02/28/2019 Readmission This is a now a 78 year old man who was previously cared for in 2016 by Dr. Lindon Romp for wounds on his lower extremities. At that point he had venous reflux studies although I cannot seem to open these in [place] link. He had arterial studies showing an ABI of 1.11 on the right and 1.27 on the left his waveforms were triphasic bilaterally. He was discharged in stockings although I do not believe he is wearing these in some time. He tells me that about a month ago he noted openings of a large wound on the posterior right calf and 2 smaller areas on the left lateral calf and a small area more recently on the left posterior calf. He has been dressing these with peroxide and triple antibiotic ointment. He is not wearing compression. Past medical history; type 2 diabetes with peripheral neuropathy, chronic venous insufficiency, hypertension, cardiomyopathy, chronic atrial fibrillation on Coumadin, prostate cancer, hyperlipidemia, gout, ABI in our clinic was 1.34 on the left and not obtainable on the right 6/9; this is a patient who has chronic  venous insufficiency. He has a fairly substantial area on the right posterior calf, left lateral calf and a small area on the left posterior calf. On arrival last week he had very palpable popliteal and femoral pulses but nothing in his bilateral feet. Unfortunately we cannot get arterial studies until July 1 at Dr. Kennon Holter office. They live in [place]. We use silver alginate under Kerlix Coban 6/16; patient with chronic venous insufficiency with wounds on his bilateral lower extremities. When he came into our clinic he was discovered to have a complete absence of peripheral pedal pulses at either the dorsalis pedis or posterior tibial. He does have easily palpable femoral and popliteal pulses. He sees Dr. [name] tomorrow for noninvasive arterial tests. He may also require venous reflux evaluation although I do not view this as an urgent thing. We have been using silver alginate. His wound surfaces of cleaned up quite nicely 6/23; patient with chronic venous insufficiency with wounds on his bilateral lower extremities. His wounds all are somewhat better looking. He did go to Dr. Kennon Holter office but somehow ended up on the doctors schedule rather than being scheduled for noninvasive tests therefore his noninvasive tests are scheduled for July 1. We agree that he has venous insufficiency ulcers but I cannot feel any pulses in his lower extremities dictating the need for test. We are only using Kerlix and light Coban unfortunately this appears to be holding the edema 6/30; has his arterial studies tomorrow. We have been using Kerlix and light Coban will go to a more aggressive compression if the arterial studies will allow. We all agreed these are venous wounds however I cannot feel pulses at either the dorsalis pedis or posterior tibial  bilaterally. His wounds generally look some better including left lateral and right posterior. 7/7-Patient returns at 1 week in Kerlix/Coban to both legs, with  improvement, in the left lateral and right posterior lower leg wounds, ABI's are normal in both legs per vascular studies, TBI is also normal on both sides, we are using hydrofera blue to the wounds 7/14; patient's arterial studies from 2 weeks ago showed an ABI on the right at 1.03 with a TBI of 0.86. On the left the ABI was 1.06 with a TBI of 0.84. Notable for the fact that his arterial waveforms were monophasic in all of the lower extremity arteries suggesting some degree of arterial occlusive disease but in general this was felt to be fairly adequate for healing. His compression was increased from 2-3 layer which is appropriate. Dressing was changed to [facility] 7/21; patient's wounds are measuring smaller. The more substantial one on the right posterior calf, second 1 on the left lateral calf. Using [facility] on both wound areas 7/28; patient continues to make nice improvements. The area on the right posterior calf is smaller. Area on the left lateral calf also is smaller. We have been using Hydrofera Blue under compression. The patient will need compression stockings and we have measured him for these in the eventuality that these heal which really should not be too long from now 8/4-Patient continues to make improvement, the right posterior calf area smaller with rim of keratotic skin on one side, the left wound is definitely smaller and improving. 8/11-Returns at 1 week, after being in 3 layer compression on both legs, both wounds appear to be improving, making good progress, patient is happy, pain is also less especially in the right leg wound 8/18; the area on the left anterior lower leg is healed. On the right posterior leg the wound remains although the dimensions are a lot better. 8/25; he arrives in clinic today with a large body of open wound on the left lateral calf. All of the 3 wounds in this area are in close juxtaposition to each other. The story is that we  discharged him last week with no a wrap on the left leg. They went to [place] could not get in as they are only excepting phone orders or online orders for stockings hence they did not put any stocking on the left leg all week. They have something at home but the patient with that was either incapable or just did not put them on. Apparently these opened 1 morning after getting out of bed. The area on the right has no real change 9/1; patient has bilateral lower extremity wounds in the setting of severe chronic venous insufficiency and secondary lymphedema. He arrived last week with new areas on the left lateral lower leg after we did not wrap him and he did not use his stockings. Nevertheless the areas on the left look better today under compression. Posterior right calf does not really changed. We are using Hydrofera Blue on both areas under compression 9/15; bilateral lower extremity wounds in the setting of severe chronic venous insufficiency and secondary lymphedema. He has 20 to 30 mmHg below-knee compression stockings under the eventuality that these close over. We did get the left leg to close but he did not transition to a stocking and this reopened. There are 2 open areas on the left posterior lateral calf and one on the right. Both of these look satisfactory. Using [facility] 9/22; bilateral lower extremity wounds in the  setting of chronic venous insufficiency. 2 superficial areas on the left lateral calf. One on the right just above the Achilles area. We have good edema control we have been using Hydrofera Blue 9/29; the areas on the left lateral calf are healed. On the right just above the Achilles and tendon area things look a lot better small wounds one scabbed area. We have been using Hydrofera Blue. We can discharge him in his own stocking on the left still wrapping on the right. This is the second time we have healed the left leg but he did not put a stocking on last time.  Hopefully this will maintain the edema from chronic venous disease with secondary lymphedema 10/6; he comes in today having a stocking on the left leg. They had trouble getting it on there is a lot of increase in swelling 2 small open areas one anteriorly and one on the medial calf. They report a lot of difficulty getting the stocking on. Paradoxically the area on the right that we have been wrapping posteriorly is closed 10/13; he comes in today with wounds bilaterally including superficial areas on the left medial and left lateral calf. As well as the right posterior has reopened in the Achilles area superiorly. He still does not have his juxta lite stockings although truthfully we would not of been able to use them today anyway. Apparently have been ordered and paid for from [employer] although they have not been delivered 10/20; his area on the right is just the boat closed on the right posterior. Still has the area on the left lateral and a very tiny area on the left medial. He has his bilateral juxta lites although he is not ready for them this week. He tolerated the increase to 4 layer compression last week quite well 10/27; the area on the right posterior calf is once again closed. He has a superficial area on the left lateral calf that is still open. He has been using Hydrofera Blue and bilateral 4-layer compression. He can change to his own juxta lite stocking on the right and we are instructing him today 11/3; the area on the right posterior calf reopened according to the patient and his wife after they took off the stocking when they got home last week. Apparently scabbed over there is now a fairly substantial wound which looks pretty much the same. Our intake nurse noted that they were using the juxta lite stockings appropriately. I was really hoping I might be able to close him out today. He has 1 very tiny remaining area on the left lateral lower leg. 11/10; right posterior calf wound  measures smaller but is still open. We have been using Hydrofera Blue. On the left he has a small oval-shaped wound and he seems to have had another wound distally that is open and likely a blister. We are using Hydrofera Blue under compression 11/17; right posterior calf wound continues to get better. We have been using Hydrofera Blue. On the left lateral one of the wounds has closed still a small open area. We have been using Hydrofera Blue on this as well. Both areas have been under 4-layer compression Arrives in clinic today with some swelling in the dorsal foot on the right some erythema of his forefoot and toes. Initially when I looked at this I almost thought this was a sunburn distal to a wrap injury. Electronic Signature(s) Signed: 08/15/2019 6:07:35 PM By: [name] MD Entered By: [name] on 08/15/2019 08:50:57 --------------------------------------------------------------------------------  Physical Exam Details Patient Name: Date of Service: [name], [name] 08/15/2019 8:00 AM Medical Record [place] Patient Account Number: [id number] Date of Birth/Sex: Treating RN: 1941-05-28 (78 y.o. [name]) [name] Primary Care Provider: Hurshel Party Other Clinician: Referring Provider: Treating Provider/Extender:, Cheryl Flash, [facility] Weeks in Treatment: 24 Constitutional Patient is hypertensive.. Pulse regular and within target range for patient.Marland Kitchen Respirations regular, non-labored and within target range.. Temperature is normal and within the target range for the patient.Marland Kitchen Appears in no distress. Eyes Conjunctivae clear. No discharge.no icterus. Respiratory work of breathing is normal. Cardiovascular Pedal pulses are palpable. Lymphatic None palpable in the bilateral popliteal area. Musculoskeletal . Integumentary (Hair, Skin) Hemosiderin deposition in both lower legs does not look too much different from usual. Erythema in the right foot and  toes which seems to look better after we took off the 4-layer compression. There is no tenderness in this area. No tenderness in the dorsal foot. Psychiatric appears at normal baseline. Notes Wound exam 1. On the left leg there is only one small open area. This is on the left lateral leg. 2. The right posterior calf wound is measuring smaller surface looks better. There is no need for debridement 3. On the right dorsal foot there is swelling. Beyond the level of where the wrap would be there is some erythema but no tenderness. When I first looked at this I almost felt the patient might of sunburned his foot but there is no such history. The left dorsal foot and toes look normal Electronic Signature(s) Signed: 08/15/2019 6:07:35 PM By: [name] MD Entered By: [name] on 08/15/2019 08:54:48 -------------------------------------------------------------------------------- Physician Orders Details Patient Name: Date of Service: Daniel Pia D. 08/15/2019 8:00 AM Medical Record [id number] Patient Account Number: [id number] Date of Birth/Sex: Treating RN: 12-Jan-1941 (78 y.o. [name]) [name] Primary Care Provider: Hurshel Party Other Clinician: Referring Provider: Treating Provider/Extender:, Cheryl Flash, [place] Weeks in Treatment: 24 Verbal / Phone Orders: No Diagnosis Coding ICD-10 Coding Code Description E11.51 Type 2 diabetes mellitus with diabetic peripheral angiopathy without gangrene L97.211 Non-pressure chronic ulcer of right calf limited to breakdown of skin E11.42 Type 2 diabetes mellitus with diabetic polyneuropathy L97.221 Non-pressure chronic ulcer of left calf limited to breakdown of skin I87.333 Chronic venous hypertension (idiopathic) with ulcer and inflammation of bilateral lower extremity Follow-up Appointments Return Appointment in 2 weeks. Nurse Visit: - next week Dressing Change Frequency Do not change entire dressing for one  week. Skin Barriers/Peri-Wound Care TCA Cream or Ointment Wound Cleansing May shower with protection. Primary Wound Dressing Wound #12 Left,Lateral Lower Leg Hydrofera Blue Wound #3R Right,Posterior Calf Hydrofera Blue Secondary Dressing Wound #12 Left,Lateral Lower Leg Dry Gauze Wound #3R Right,Posterior Calf Dry Gauze Edema Control 3 Layer Compression System - Bilateral Electronic Signature(s) Signed: 08/15/2019 6:07:35 PM By: [name] MD Signed: 09/05/2019 2:50:47 PM By: [name] RN Entered By: [name] on 08/15/2019 08:42:58 -------------------------------------------------------------------------------- Problem List Details Patient Name: Date of Service: Daniel Pia D. 08/15/2019 8:00 AM Medical Record [id number] Patient Account Number: [id number] Date of Birth/Sex: Treating RN: 08/18/1941 (78 y.o. [name] Primary Care Provider: Hurshel Party Other Clinician: Referring Provider: Treating Provider/Extender:, Cheryl Flash, [place] Weeks in Treatment: 24 Active Problems ICD-10 Evaluated Encounter Code Description Active Date Today Diagnosis E11.51 Type 2 diabetes mellitus with diabetic peripheral 02/28/2019 No Yes angiopathy without gangrene L97.211 Non-pressure chronic ulcer of right calf limited to 02/28/2019 No Yes breakdown of skin E11.42 Type 2 diabetes mellitus with diabetic polyneuropathy 02/28/2019 No Yes L97.221  Non-pressure chronic ulcer of left calf limited to 02/28/2019 No Yes breakdown of skin I87.333 Chronic venous hypertension (idiopathic) with ulcer 02/28/2019 No Yes and inflammation of bilateral lower extremity Inactive Problems Resolved Problems Electronic Signature(s) Signed: 08/15/2019 6:07:35 PM By: [name] MD Entered By: [name] on 08/15/2019 08:49:50 -------------------------------------------------------------------------------- Progress Note Details Patient Name: Date of Service: Daniel Pia D. 08/15/2019 8:00 AM Medical Record [id number] Patient Account Number: [id number] Date of Birth/Sex: Treating RN: June 07, 1941 (78 y.o. [name]) [name] Primary Care Provider: Hurshel Party Other Clinician: Referring Provider: Treating Provider/Extender:, Cheryl Flash, [place] Weeks in Treatment: 24 Subjective History of Present Illness (HPI) The following HPI elements were documented for the patient's wound: Location: Patient presents with a wound to left lower leg. Quality: Patient reports No Pain. Duration: 2 months no cig or alcohol. spontaneous appearance in area of stasis dermamtitis. [name] on metformin only. chronic afib on Coumadin. diabetes and coag studies not good. hba1c 7.5. ivr 4.5. no pain or sxs of systemic disease. hx chf. no intermittent claudication 02/28/2019 Readmission This is a now a 78 year old man who was previously cared for in 2016 by Dr. Lindon Romp for wounds on his lower extremities. At that point he had venous reflux studies although I cannot seem to open these in [place] link. He had arterial studies showing an ABI of 1.11 on the right and 1.27 on the left his waveforms were triphasic bilaterally. He was discharged in stockings although I do not believe he is wearing these in some time. He tells me that about a month ago he noted openings of a large wound on the posterior right calf and 2 smaller areas on the left lateral calf and a small area more recently on the left posterior calf. He has been dressing these with peroxide and triple antibiotic ointment. He is not wearing compression. Past medical history; type 2 diabetes with peripheral neuropathy, chronic venous insufficiency, hypertension, cardiomyopathy, chronic atrial fibrillation on Coumadin, prostate cancer, hyperlipidemia, gout, ABI in our clinic was 1.34 on the left and not obtainable on the right 6/9; this is a patient who has chronic venous insufficiency. He has a fairly  substantial area on the right posterior calf, left lateral calf and a small area on the left posterior calf. On arrival last week he had very palpable popliteal and femoral pulses but nothing in his bilateral feet. Unfortunately we cannot get arterial studies until July 1 at Dr. Kennon Holter office. They live in [place]. We use silver alginate under Kerlix Coban 6/16; patient with chronic venous insufficiency with wounds on his bilateral lower extremities. When he came into our clinic he was discovered to have a complete absence of peripheral pedal pulses at either the dorsalis pedis or posterior tibial. He does have easily palpable femoral and popliteal pulses. He sees Dr. [name] tomorrow for noninvasive arterial tests. He may also require venous reflux evaluation although I do not view this as an urgent thing. We have been using silver alginate. His wound surfaces of cleaned up quite nicely 6/23; patient with chronic venous insufficiency with wounds on his bilateral lower extremities. His wounds all are somewhat better looking. He did go to Dr. Kennon Holter office but somehow ended up on the doctors schedule rather than being scheduled for noninvasive tests therefore his noninvasive tests are scheduled for July 1. We agree that he has venous insufficiency ulcers but I cannot feel any pulses in his lower extremities dictating the need for test. We are only using Kerlix and light  Coban unfortunately this appears to be holding the edema 6/30; has his arterial studies tomorrow. We have been using Kerlix and light Coban will go to a more aggressive compression if the arterial studies will allow. We all agreed these are venous wounds however I cannot feel pulses at either the dorsalis pedis or posterior tibial bilaterally. His wounds generally look some better including left lateral and right posterior. 7/7-Patient returns at 1 week in Kerlix/Coban to both legs, with improvement, in the left lateral and right  posterior lower leg wounds, ABI's are normal in both legs per vascular studies, TBI is also normal on both sides, we are using hydrofera blue to the wounds 7/14; patient's arterial studies from 2 weeks ago showed an ABI on the right at 1.03 with a TBI of 0.86. On the left the ABI was 1.06 with a TBI of 0.84. Notable for the fact that his arterial waveforms were monophasic in all of the lower extremity arteries suggesting some degree of arterial occlusive disease but in general this was felt to be fairly adequate for healing. His compression was increased from 2-3 layer which is appropriate. Dressing was changed to [facility] 7/21; patient's wounds are measuring smaller. The more substantial one on the right posterior calf, second 1 on the left lateral calf. Using [facility] on both wound areas 7/28; patient continues to make nice improvements. The area on the right posterior calf is smaller. Area on the left lateral calf also is smaller. We have been using Hydrofera Blue under compression. The patient will need compression stockings and we have measured him for these in the eventuality that these heal which really should not be too long from now 8/4-Patient continues to make improvement, the right posterior calf area smaller with rim of keratotic skin on one side, the left wound is definitely smaller and improving. 8/11-Returns at 1 week, after being in 3 layer compression on both legs, both wounds appear to be improving, making good progress, patient is happy, pain is also less especially in the right leg wound 8/18; the area on the left anterior lower leg is healed. On the right posterior leg the wound remains although the dimensions are a lot better. 8/25; he arrives in clinic today with a large body of open wound on the left lateral calf. All of the 3 wounds in this area are in close juxtaposition to each other. The story is that we discharged him last week with no a wrap on  the left leg. They went to [place] could not get in as they are only excepting phone orders or online orders for stockings hence they did not put any stocking on the left leg all week. They have something at home but the patient with that was either incapable or just did not put them on. Apparently these opened 1 morning after getting out of bed. The area on the right has no real change 9/1; patient has bilateral lower extremity wounds in the setting of severe chronic venous insufficiency and secondary lymphedema. He arrived last week with new areas on the left lateral lower leg after we did not wrap him and he did not use his stockings. Nevertheless the areas on the left look better today under compression. Posterior right calf does not really changed. We are using Hydrofera Blue on both areas under compression 9/15; bilateral lower extremity wounds in the setting of severe chronic venous insufficiency and secondary lymphedema. He has 20 to 30 mmHg below-knee compression stockings under  the eventuality that these close over. We did get the left leg to close but he did not transition to a stocking and this reopened. There are 2 open areas on the left posterior lateral calf and one on the right. Both of these look satisfactory. Using [facility] 9/22; bilateral lower extremity wounds in the setting of chronic venous insufficiency. 2 superficial areas on the left lateral calf. One on the right just above the Achilles area. We have good edema control we have been using Hydrofera Blue 9/29; the areas on the left lateral calf are healed. On the right just above the Achilles and tendon area things look a lot better small wounds one scabbed area. We have been using Hydrofera Blue. We can discharge him in his own stocking on the left still wrapping on the right. This is the second time we have healed the left leg but he did not put a stocking on last time. Hopefully this will maintain the edema from  chronic venous disease with secondary lymphedema 10/6; he comes in today having a stocking on the left leg. They had trouble getting it on there is a lot of increase in swelling 2 small open areas one anteriorly and one on the medial calf. They report a lot of difficulty getting the stocking on. ooParadoxically the area on the right that we have been wrapping posteriorly is closed 10/13; he comes in today with wounds bilaterally including superficial areas on the left medial and left lateral calf. As well as the right posterior has reopened in the Achilles area superiorly. He still does not have his juxta lite stockings although truthfully we would not of been able to use them today anyway. Apparently have been ordered and paid for from [facility] although they have not been delivered 10/20; his area on the right is just the boat closed on the right posterior. Still has the area on the left lateral and a very tiny area on the left medial. He has his bilateral juxta lites although he is not ready for them this week. He tolerated the increase to 4 layer compression last week quite well 10/27; the area on the right posterior calf is once again closed. He has a superficial area on the left lateral calf that is still open. He has been using Hydrofera Blue and bilateral 4-layer compression. He can change to his own juxta lite stocking on the right and we are instructing him today 11/3; the area on the right posterior calf reopened according to the patient and his wife after they took off the stocking when they got home last week. Apparently scabbed over there is now a fairly substantial wound which looks pretty much the same. Our intake nurse noted that they were using the juxta lite stockings appropriately. I was really hoping I might be able to close him out today. He has 1 very tiny remaining area on the left lateral lower leg. 11/10; right posterior calf wound measures smaller but is still open. We  have been using Hydrofera Blue. On the left he has a small oval-shaped wound and he seems to have had another wound distally that is open and likely a blister. We are using Hydrofera Blue under compression 11/17; right posterior calf wound continues to get better. We have been using Hydrofera Blue. On the left lateral one of the wounds has closed still a small open area. We have been using Hydrofera Blue on this as well. Both areas have been under 4-layer compression  Arrives in clinic today with some swelling in the dorsal foot on the right some erythema of his forefoot and toes. Initially when I looked at this I almost thought this was a sunburn distal to a wrap injury. Objective Constitutional Patient is hypertensive.. Pulse regular and within target range for patient.Marland Kitchen Respirations regular, non-labored and within target range.. Temperature is normal and within the target range for the patient.Marland Kitchen Appears in no distress. Vitals Time Taken: 7:50 AM, Height: 74 in, Weight: 212 lbs, BMI: 27.2, Temperature: 98.0 F, Pulse: 70 bpm, Respiratory Rate: 19 breaths/min, Blood Pressure: 146/85 mmHg. Eyes Conjunctivae clear. No discharge.no icterus. Respiratory work of breathing is normal. Cardiovascular Pedal pulses are palpable. Lymphatic None palpable in the bilateral popliteal area. Psychiatric appears at normal baseline. General Notes: Wound exam 1. On the left leg there is only one small open area. This is on the left lateral leg. 2. The right posterior calf wound is measuring smaller surface looks better. There is no need for debridement 3. On the right dorsal foot there is swelling. Beyond the level of where the wrap would be there is some erythema but no tenderness. When I first looked at this I almost felt the patient might of sunburned his foot but there is no such history. The left dorsal foot and toes look normal Integumentary (Hair, Skin) Hemosiderin deposition in both lower legs  does not look too much different from usual. Erythema in the right foot and toes which seems to look better after we took off the 4-layer compression. There is no tenderness in this area. No tenderness in the dorsal foot. Wound #12 status is Open. Original cause of wound was Blister. The wound is located on the Left,Lateral Lower Leg. The wound measures 0.9cm length x 0.5cm width x 0.1cm depth; 0.353cm^2 area and 0.035cm^3 volume. There is Fat Layer (Subcutaneous Tissue) Exposed exposed. There is no tunneling or undermining noted. There is a small amount of serosanguineous drainage noted. The wound margin is distinct with the outline attached to the wound base. There is large (67-100%) pink granulation within the wound bed. There is no necrotic tissue within the wound bed. Wound #3R status is Open. Original cause of wound was Gradually Appeared. The wound is located on the Right,Posterior Calf. The wound measures 1.8cm length x 2.1cm width x 0.1cm depth; 2.969cm^2 area and 0.297cm^3 volume. There is Fat Layer (Subcutaneous Tissue) Exposed exposed. There is no tunneling or undermining noted. There is a small amount of serosanguineous drainage noted. The wound margin is flat and intact. There is large (67-100%) red granulation within the wound bed. There is a small (1-33%) amount of necrotic tissue within the wound bed including Adherent Slough. Wound #9R status is Open. Original cause of wound was Not Known. The wound is located on the Left,Distal,Lateral Lower Leg. The wound measures 0cm length x 0cm width x 0cm depth; 0cm^2 area and 0cm^3 volume. There is no tunneling or undermining noted. There is a none present amount of drainage noted. The wound margin is flat and intact. There is no granulation within the wound bed. There is no necrotic tissue within the wound bed. 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 There was a Three Layer Compression Therapy Procedure by [name], RN. Post procedure Diagnosis Wound #12: Same as Pre-Procedure Wound #3R Pre-procedure diagnosis of Wound #3R is a Diabetic Wound/Ulcer of the Lower Extremity located on the Right,Posterior Calf . There was a Three Layer Compression Therapy Procedure by [name], RN. Post procedure Diagnosis Wound #3R: Same as Pre-Procedure Plan Follow-up Appointments: Return Appointment in 2 weeks. Nurse Visit: - next week Dressing Change Frequency: Do not change entire dressing for one week. Skin Barriers/Peri-Wound Care: TCA Cream or Ointment Wound Cleansing: May shower with protection. Primary Wound Dressing: Wound #12 Left,Lateral Lower Leg: Hydrofera Blue Wound #3R Right,Posterior Calf: Hydrofera Blue Secondary Dressing: Wound #12 Left,Lateral Lower Leg: Dry Gauze Wound #3R Right,Posterior Calf: Dry Gauze Edema Control: 3 Layer Compression System - Bilateral 1. I am going to continue with the same dressings to the wounds on the posterior calf on the right and the lateral calf on the left 2. I have reduce the compression to 3 layers bilaterally because of the forefoot edema. There is no evidence of cellulitis. 3. The patient has bilateral juxta lites. When these wounds close if the juxta lites are not capable of maintaining skin integrity we may need to look at external compression pumps even though there is not that much swelling. Electronic Signature(s) Signed: 08/15/2019 6:07:35 PM By: [name] MD Entered By:  [name] on 08/15/2019 08:57:05 -------------------------------------------------------------------------------- SuperBill Details Patient Name: Date of Service: [name] 08/15/2019 Medical Record [id number] Patient Account Number: [id number] Date of Birth/Sex: Treating RN: [birth year] (78 y.o. [name]) Dolores Lory, Morey Hummingbird Primary Care Provider: Hurshel Party Other Clinician: Referring Provider: Treating Provider/Extender:, Cheryl Flash, Trosky Weeks in Treatment: 24 Diagnosis Coding ICD-10 Codes Code Description E11.51 Type 2 diabetes mellitus with diabetic peripheral angiopathy without gangrene L97.211 Non-pressure chronic ulcer of right calf limited to breakdown of skin E11.42 Type 2 diabetes mellitus with diabetic polyneuropathy L97.221 Non-pressure chronic ulcer of left calf limited to breakdown of skin I87.333 Chronic venous hypertension (idiopathic) with ulcer and inflammation of bilateral lower extremity Facility Procedures The patient participates with Medicare or their insurance follows the Medicare Facility Guidelines: CPT4 Description Modifier Quantity Code [id number] BILATERAL: Application of multi-layer venous compression system; leg 1 (below knee), including ankle and foot. Physician Procedures CPT4 Code: [id number] Description: [id number] - WC PHYS LEVEL 3 - EST PT ICD-10 Diagnosis Description L97.221 Non-pressure chronic ulcer of left calf limited to bre L97.211 Non-pressure chronic ulcer of right calf limited to br Modifier: akdown of skin eakdown of skin Quantity: 1 Electronic Signature(s) Signed: 08/15/2019 6:07:35 PM By: [name] MD Entered By: [name] on 08/15/2019 08:57:33

## 2019-09-05 NOTE — Progress Notes (Signed)
[name], [name] ([id number]) Visit Report for 07/18/2019 HPI Details Patient Name: Date of Service: [name], [name] 07/18/2019 8:00 AM Medical Record [name] Patient Account Number: [id number] Date of Birth/Sex: Treating RN: Aug 14, 1941 (78 y.o. [name]) [name] Primary Care Provider: Hurshel Gross Other Clinician: Referring Provider: Treating Provider/Extender:[name], [name], [name] Weeks in Treatment: 20 History of Present Illness Location: Patient presents with a wound to left lower leg. Quality: Patient reports No Pain. Duration: 2 months HPI Description: no cig or alcohol. spontaneous appearance in area of stasis dermamtitis. [name] on metformin only. chronic afib on Coumadin. diabetes and coag studies not good. hba1c 7.5. ivr 4.5. no pain or sxs of systemic disease. hx chf. no intermittent claudication 02/28/2019 Readmission This is a now a 78 year old man who was previously cared for in 2016 by Dr. Lindon Gross for wounds on his lower extremities. At that point he had venous reflux studies although I cannot seem to open these in [name] link. He had arterial studies showing an ABI of 1.11 on the right and 1.27 on the left his waveforms were triphasic bilaterally. He was discharged in stockings although I do not believe he is wearing these in some time. He tells me that about a month ago he noted openings of a large wound on the posterior right calf and 2 smaller areas on the left lateral calf and a small area more recently on the left posterior calf. He has been dressing these with peroxide and triple antibiotic ointment. He is not wearing compression. Past medical history; type 2 diabetes with peripheral neuropathy, chronic venous insufficiency, hypertension, cardiomyopathy, chronic atrial fibrillation on Coumadin, prostate cancer, hyperlipidemia, gout, ABI in our clinic was 1.34 on the left and not obtainable on the right 6/9; this is a patient who has chronic  venous insufficiency. He has a fairly substantial area on the right posterior calf, left lateral calf and a small area on the left posterior calf. On arrival last week he had very palpable popliteal and femoral pulses but nothing in his bilateral feet. Unfortunately we cannot get arterial studies until July 1 at [name] office. They live in [name]. We use silver alginate under Kerlix Coban 6/16; patient with chronic venous insufficiency with wounds on his bilateral lower extremities. When he came into our clinic he was discovered to have a complete absence of peripheral pedal pulses at either the dorsalis pedis or posterior tibial. He does have easily palpable femoral and popliteal pulses. He sees Dr. Gwenlyn Gross tomorrow for noninvasive arterial tests. He may also require venous reflux evaluation although I do not view this as an urgent thing. We have been using silver alginate. His wound surfaces of cleaned up quite nicely 6/23; patient with chronic venous insufficiency with wounds on his bilateral lower extremities. His wounds all are somewhat better looking. He did go to [name] office but somehow ended up on the doctors schedule rather than being scheduled for noninvasive tests therefore his noninvasive tests are scheduled for July 1. We agree that he has venous insufficiency ulcers but I cannot feel any pulses in his lower extremities dictating the need for test. We are only using Kerlix and light Coban unfortunately this appears to be holding the edema 6/30; has his arterial studies tomorrow. We have been using Kerlix and light Coban will go to a more aggressive compression if the arterial studies will allow. We all agreed these are venous wounds however I cannot feel pulses at either the dorsalis pedis or posterior tibial  bilaterally. His wounds generally look some better including left lateral and right posterior. 7/7-Patient returns at 1 week in Kerlix/Coban to both legs, with  improvement, in the left lateral and right posterior lower leg wounds, ABI's are normal in both legs per vascular studies, TBI is also normal on both sides, we are using hydrofera blue to the wounds 7/14; patient's arterial studies from 2 weeks ago showed an ABI on the right at 1.03 with a TBI of 0.86. On the left the ABI was 1.06 with a TBI of 0.84. Notable for the fact that his arterial waveforms were monophasic in all of the lower extremity arteries suggesting some degree of arterial occlusive disease but in general this was felt to be fairly adequate for healing. His compression was increased from 2-3 layer which is appropriate. Dressing was changed to [name] 7/21; patient's wounds are measuring smaller. The more substantial one on the right posterior calf, second 1 on the left lateral calf. Using [name] on both wound areas 7/28; patient continues to make nice improvements. The area on the right posterior calf is smaller. Area on the left lateral calf also is smaller. We have been using Hydrofera Blue under compression. The patient will need compression stockings and we have measured him for these in the eventuality that these heal which really should not be too long from now 8/4-Patient continues to make improvement, the right posterior calf area smaller with rim of keratotic skin on one side, the left wound is definitely smaller and improving. 8/11-Returns at 1 week, after being in 3 layer compression on both legs, both wounds appear to be improving, making good progress, patient is happy, pain is also less especially in the right leg wound 8/18; the area on the left anterior lower leg is healed. On the right posterior leg the wound remains although the dimensions are a lot better. 8/25; he arrives in clinic today with a large body of open wound on the left lateral calf. All of the 3 wounds in this area are in close juxtaposition to each other. The story is that we  discharged him last week with no a wrap on the left leg. They went to [name] could not get in as they are only excepting phone orders or online orders for stockings hence they did not put any stocking on the left leg all week. They have something at home but the patient with that was either incapable or just did not put them on. Apparently these opened 1 morning after getting out of bed. The area on the right has no real change 9/1; patient has bilateral lower extremity wounds in the setting of severe chronic venous insufficiency and secondary lymphedema. He arrived last week with new areas on the left lateral lower leg after we did not wrap him and he did not use his stockings. Nevertheless the areas on the left look better today under compression. Posterior right calf does not really changed. We are using Hydrofera Blue on both areas under compression 9/15; bilateral lower extremity wounds in the setting of severe chronic venous insufficiency and secondary lymphedema. He has 20 to 30 mmHg below-knee compression stockings under the eventuality that these close over. We did get the left leg to close but he did not transition to a stocking and this reopened. There are 2 open areas on the left posterior lateral calf and one on the right. Both of these look satisfactory. Using [facility] 9/22; bilateral lower extremity wounds in the  setting of chronic venous insufficiency. 2 superficial areas on the left lateral calf. One on the right just above the Achilles area. We have good edema control we have been using Hydrofera Blue 9/29; the areas on the left lateral calf are healed. On the right just above the Achilles and tendon area things look a lot better small wounds one scabbed area. We have been using Hydrofera Blue. We can discharge him in his own stocking on the left still wrapping on the right. This is the second time we have healed the left leg but he did not put a stocking on last time.  Hopefully this will maintain the edema from chronic venous disease with secondary lymphedema 10/6; he comes in today having a stocking on the left leg. They had trouble getting it on there is a lot of increase in swelling 2 small open areas one anteriorly and one on the medial calf. They report a lot of difficulty getting the stocking on. Paradoxically the area on the right that we have been wrapping posteriorly is closed 10/13; he comes in today with wounds bilaterally including superficial areas on the left medial and left lateral calf. As well as the right posterior has reopened in the Achilles area superiorly. He still does not have his juxta lite stockings although truthfully we would not of been able to use them today anyway. Apparently have been ordered and paid for from [employer] although they have not been delivered 10/20; his area on the right is just the boat closed on the right posterior. Still has the area on the left lateral and a very tiny area on the left medial. He has his bilateral juxta lites although he is not ready for them this week. He tolerated the increase to 4 layer compression last week quite well Electronic Signature(s) Signed: 07/18/2019 6:17:56 PM By: [name] MD Entered By: [name] on 07/18/2019 09:01:26 -------------------------------------------------------------------------------- Physical Exam Details Patient Name: Date of Service: [name], [name] 07/18/2019 8:00 AM Medical Record [id number] Patient Account Number: [id number] Date of Birth/Sex: Treating RN: 12/17/1940 (78 y.o. [name]) [name] Primary Care Provider: Hurshel Gross Other Clinician: Referring Provider: Treating Provider/Extender:Arron Mcnaught, [name], [place] Weeks in Treatment: 20 Constitutional Sitting or standing Blood Pressure is within target range for patient.. Pulse regular and within target range for patient.Marland Kitchen Respirations regular, non-labored and within  target range.. Temperature is normal and within the target range for the patient.Marland Kitchen Appears in no distress. Eyes Conjunctivae clear. No discharge.no icterus. Respiratory work of breathing is normal. Cardiovascular Pedal pulses palpable. Edema control is excellent. Integumentary (Hair, Skin) Changes of chronic venous insufficiency. Psychiatric appears at normal baseline. Notes Wound exam; right posterior lower leg very tiny open area. No debridement was necessary. This is just about closed on the left leg a dime sized area laterally and a small area medially. Electronic Signature(s) Signed: 07/18/2019 6:17:56 PM By: [name] MD Entered By: [name] on 07/18/2019 08:59:19 -------------------------------------------------------------------------------- Physician Orders Details Patient Name: Date of Service: [name], [name] 07/18/2019 8:00 AM Medical Record [id number] Patient Account Number: [id number] Date of Birth/Sex: Treating RN: Nov 21, 1940 (78 y.o. [name]) Dolores Lory, [place] Primary Care Provider: Hurshel Gross Other Clinician: Referring Provider: Treating Provider/Extender:Aliesha Dolata, [name], [place] Weeks in Treatment: 20 Verbal / Phone Orders: No Diagnosis Coding ICD-10 Coding Code Description E11.51 Type 2 diabetes mellitus with diabetic peripheral angiopathy without gangrene L97.211 Non-pressure chronic ulcer of right calf limited to breakdown of skin E11.42 Type 2 diabetes mellitus with diabetic polyneuropathy L97.221 Non-pressure  chronic ulcer of left calf limited to breakdown of skin I87.333 Chronic venous hypertension (idiopathic) with ulcer and inflammation of bilateral lower extremity Follow-up Appointments Return Appointment in 1 week. Dressing Change Frequency Do not change entire dressing for one week. Skin Barriers/Peri-Wound Care TCA Cream or Ointment Wound Cleansing May shower with protection. Primary Wound Dressing Wound #3  Right,Posterior Calf Hydrofera Blue Secondary Dressing Wound #3 Right,Posterior Calf Dry Gauze ABD pad Edema Control 4 layer compression - Bilateral Electronic Signature(s) Signed: 07/18/2019 6:17:56 PM By: [name] MD Signed: 09/05/2019 3:01:15 PM By: Daniel Coria RN Entered By: [name] on 07/18/2019 08:29:03 -------------------------------------------------------------------------------- Problem List Details Patient Name: Date of Service: Daniel Pia D. 07/18/2019 8:00 AM Medical Record [id number] Patient Account Number: [id number] Date of Birth/Sex: Treating RN: [birth year] (78 y.o. [name]) Dolores Lory, [place] Primary Care Provider: Hurshel Gross Other Clinician: Referring Provider: Treating Provider/Extender:Jessaca Philippi, [name], [place] Weeks in Treatment: 20 Active Problems ICD-10 Evaluated Encounter Code Description Active Date Today Diagnosis E11.51 Type 2 diabetes mellitus with diabetic peripheral 02/28/2019 No Yes angiopathy without gangrene L97.211 Non-pressure chronic ulcer of right calf limited to 02/28/2019 No Yes breakdown of skin E11.42 Type 2 diabetes mellitus with diabetic polyneuropathy 02/28/2019 No Yes L97.221 Non-pressure chronic ulcer of left calf limited to 02/28/2019 No Yes breakdown of skin I87.333 Chronic venous hypertension (idiopathic) with ulcer 02/28/2019 No Yes and inflammation of bilateral lower extremity Inactive Problems Resolved Problems Electronic Signature(s) Signed: 07/18/2019 6:17:56 PM By: [name] MD Entered By: [name] on 07/18/2019 08:55:11 -------------------------------------------------------------------------------- Progress Note Details Patient Name: Date of Service: Daniel Pia D. 07/18/2019 8:00 AM Medical Record [id number] Patient Account Number: [id number] Date of Birth/Sex: Treating RN: 1941-07-03 (78 y.o. [name]) [name] Primary Care Provider: Hurshel Gross Other Clinician: Referring  Provider: Treating Provider/Extender:Yuval Nolet, [name], [place] Weeks in Treatment: 20 Subjective History of Present Illness (HPI) The following HPI elements were documented for the patient's wound: Location: Patient presents with a wound to left lower leg. Quality: Patient reports No Pain. Duration: 2 months no cig or alcohol. spontaneous appearance in area of stasis dermamtitis. [name] on metformin only. chronic afib on Coumadin. diabetes and coag studies not good. hba1c 7.5. ivr 4.5. no pain or sxs of systemic disease. hx chf. no intermittent claudication 02/28/2019 Readmission This is a now a 78 year old man who was previously cared for in 2016 by Dr. Lindon Gross for wounds on his lower extremities. At that point he had venous reflux studies although I cannot seem to open these in [place] link. He had arterial studies showing an ABI of 1.11 on the right and 1.27 on the left his waveforms were triphasic bilaterally. He was discharged in stockings although I do not believe he is wearing these in some time. He tells me that about a month ago he noted openings of a large wound on the posterior right calf and 2 smaller areas on the left lateral calf and a small area more recently on the left posterior calf. He has been dressing these with peroxide and triple antibiotic ointment. He is not wearing compression. Past medical history; type 2 diabetes with peripheral neuropathy, chronic venous insufficiency, hypertension, cardiomyopathy, chronic atrial fibrillation on Coumadin, prostate cancer, hyperlipidemia, gout, ABI in our clinic was 1.34 on the left and not obtainable on the right 6/9; this is a patient who has chronic venous insufficiency. He has a fairly substantial area on the right posterior calf, left lateral calf and a small area on the left posterior calf. On arrival last  week he had very palpable popliteal and femoral pulses but nothing in his bilateral feet. Unfortunately we cannot  get arterial studies until July 1 at [name] office. They live in [place]. We use silver alginate under Kerlix Coban 6/16; patient with chronic venous insufficiency with wounds on his bilateral lower extremities. When he came into our clinic he was discovered to have a complete absence of peripheral pedal pulses at either the dorsalis pedis or posterior tibial. He does have easily palpable femoral and popliteal pulses. He sees Dr. Gwenlyn Gross tomorrow for noninvasive arterial tests. He may also require venous reflux evaluation although I do not view this as an urgent thing. We have been using silver alginate. His wound surfaces of cleaned up quite nicely 6/23; patient with chronic venous insufficiency with wounds on his bilateral lower extremities. His wounds all are somewhat better looking. He did go to [name] office but somehow ended up on the doctors schedule rather than being scheduled for noninvasive tests therefore his noninvasive tests are scheduled for July 1. We agree that he has venous insufficiency ulcers but I cannot feel any pulses in his lower extremities dictating the need for test. We are only using Kerlix and light Coban unfortunately this appears to be holding the edema 6/30; has his arterial studies tomorrow. We have been using Kerlix and light Coban will go to a more aggressive compression if the arterial studies will allow. We all agreed these are venous wounds however I cannot feel pulses at either the dorsalis pedis or posterior tibial bilaterally. His wounds generally look some better including left lateral and right posterior. 7/7-Patient returns at 1 week in Kerlix/Coban to both legs, with improvement, in the left lateral and right posterior lower leg wounds, ABI's are normal in both legs per vascular studies, TBI is also normal on both sides, we are using hydrofera blue to the wounds 7/14; patient's arterial studies from 2 weeks ago showed an ABI on the right at  1.03 with a TBI of 0.86. On the left the ABI was 1.06 with a TBI of 0.84. Notable for the fact that his arterial waveforms were monophasic in all of the lower extremity arteries suggesting some degree of arterial occlusive disease but in general this was felt to be fairly adequate for healing. His compression was increased from 2-3 layer which is appropriate. Dressing was changed to [facility] 7/21; patient's wounds are measuring smaller. The more substantial one on the right posterior calf, second 1 on the left lateral calf. Using [facility] on both wound areas 7/28; patient continues to make nice improvements. The area on the right posterior calf is smaller. Area on the left lateral calf also is smaller. We have been using Hydrofera Blue under compression. The patient will need compression stockings and we have measured him for these in the eventuality that these heal which really should not be too long from now 8/4-Patient continues to make improvement, the right posterior calf area smaller with rim of keratotic skin on one side, the left wound is definitely smaller and improving. 8/11-Returns at 1 week, after being in 3 layer compression on both legs, both wounds appear to be improving, making good progress, patient is happy, pain is also less especially in the right leg wound 8/18; the area on the left anterior lower leg is healed. On the right posterior leg the wound remains although the dimensions are a lot better. 8/25; he arrives in clinic today with a large body of open  wound on the left lateral calf. All of the 3 wounds in this area are in close juxtaposition to each other. The story is that we discharged him last week with no a wrap on the left leg. They went to  could not get in as they are only excepting phone orders or online orders for stockings hence they did not put any stocking on the left leg all week. They have something at home but the patient with that was  either incapable or just did not put them on. Apparently these opened 1 morning after getting out of bed. The area on the right has no real change 9/1; patient has bilateral lower extremity wounds in the setting of severe chronic venous insufficiency and secondary lymphedema. He arrived last week with new areas on the left lateral lower leg after we did not wrap him and he did not use his stockings. Nevertheless the areas on the left look better today under compression. Posterior right calf does not really changed. We are using Hydrofera Blue on both areas under compression 9/15; bilateral lower extremity wounds in the setting of severe chronic venous insufficiency and secondary lymphedema. He has 20 to 30 mmHg below-knee compression stockings under the eventuality that these close over. We did get the left leg to close but he did not transition to a stocking and this reopened. There are 2 open areas on the left posterior lateral calf and one on the right. Both of these look satisfactory. Using [facility] 9/22; bilateral lower extremity wounds in the setting of chronic venous insufficiency. 2 superficial areas on the left lateral calf. One on the right just above the Achilles area. We have good edema control we have been using Hydrofera Blue 9/29; the areas on the left lateral calf are healed. On the right just above the Achilles and tendon area things look a lot better small wounds one scabbed area. We have been using Hydrofera Blue. We can discharge him in his own stocking on the left still wrapping on the right. This is the second time we have healed the left leg but he did not put a stocking on last time. Hopefully this will maintain the edema from chronic venous disease with secondary lymphedema 10/6; he comes in today having a stocking on the left leg. They had trouble getting it on there is a lot of increase in swelling 2 small open areas one anteriorly and one on the medial calf. They  report a lot of difficulty getting the stocking on. ooParadoxically the area on the right that we have been wrapping posteriorly is closed 10/13; he comes in today with wounds bilaterally including superficial areas on the left medial and left lateral calf. As well as the right posterior has reopened in the Achilles area superiorly. He still does not have his juxta lite stockings although truthfully we would not of been able to use them today anyway. Apparently have been ordered and paid for from [employer] although they have not been delivered 10/20; his area on the right is just the boat closed on the right posterior. Still has the area on the left lateral and a very tiny area on the left medial. He has his bilateral juxta lites although he is not ready for them this week Objective Constitutional Sitting or standing Blood Pressure is within target range for patient.. Pulse regular and within target range for patient.Marland Kitchen Respirations regular, non-labored and within target range.. Temperature is normal and within the target range for  the patient.Marland Kitchen Appears in no distress. Vitals Time Taken: 7:54 AM, Height: 74 in, Weight: 212 lbs, BMI: 27.2, Temperature: 97.9 F, Pulse: 74 bpm, Respiratory Rate: 18 breaths/min, Blood Pressure: 138/82 mmHg. Eyes Conjunctivae clear. No discharge.no icterus. Respiratory work of breathing is normal. Cardiovascular Pedal pulses palpable. Edema control is excellent. Psychiatric appears at normal baseline. General Notes: Wound exam; right posterior lower leg very tiny open area. No debridement was necessary. This is just about closed on the left leg a dime sized area laterally and a small area medially. Integumentary (Hair, Skin) Changes of chronic venous insufficiency. Wound #10 status is Open. Original cause of wound was Gradually Appeared. The wound is located on the Left,Medial Lower Leg. The wound measures 0cm length x 0cm width x 0cm depth; 0cm^2 area and 0cm^3  volume. There is no tunneling or undermining noted. There is a none present amount of drainage noted. The wound margin is distinct with the outline attached to the wound base. There is no granulation within the wound bed. There is no necrotic tissue within the wound bed. Wound #3 status is Open. Original cause of wound was Gradually Appeared. The wound is located on the Right,Posterior Calf. The wound measures 0.3cm length x 0.2cm width x 0.1cm depth; 0.047cm^2 area and 0.005cm^3 volume. There is Fat Layer (Subcutaneous Tissue) Exposed exposed. There is no tunneling or undermining noted. There is a small amount of serosanguineous drainage noted. The wound margin is flat and intact. There is large (67-100%) red granulation within the wound bed. There is a small (1-33%) amount of necrotic tissue within the wound bed including Adherent Slough. Wound #9R status is Open. Original cause of wound was Not Known. The wound is located on the Left,Distal,Lateral Lower Leg. The wound measures 1.7cm length x 1.8cm width x 0.1cm depth; 2.403cm^2 area and 0.24cm^3 volume. There is Fat Layer (Subcutaneous Tissue) Exposed exposed. There is no tunneling or undermining noted. There is a medium amount of serosanguineous drainage noted. The wound margin is flat and intact. There is large (67-100%) red granulation within the wound bed. There is no necrotic tissue within the wound bed. 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 There was  a Four Layer Compression Therapy Procedure by Daniel Coria, RN. Post procedure Diagnosis Wound #10: Same as Pre-Procedure Wound #3 Pre-procedure diagnosis of Wound #3 is a Diabetic Wound/Ulcer of the Lower Extremity located on the Right,Posterior Calf . There was a Four Layer Compression Therapy Procedure by Daniel Coria, RN. Post procedure Diagnosis Wound #3: Same as Pre-Procedure Wound #9R Pre-procedure diagnosis of Wound #9R is a Venous Leg Ulcer located on the Left,Distal,Lateral Lower Leg . There was a Four Layer Compression Therapy Procedure by Daniel Coria, RN. Post procedure Diagnosis Wound #9R: Same as Pre-Procedure Plan Follow-up Appointments: Return Appointment in 1 week. Dressing Change Frequency: Do not change entire dressing for one week. Skin Barriers/Peri-Wound Care: TCA Cream or Ointment Wound Cleansing: May shower with protection. Primary Wound Dressing: Wound #3 Right,Posterior Calf: Hydrofera Blue Secondary Dressing: Wound #3 Right,Posterior Calf: Dry Gauze ABD pad Edema Control: 4 layer compression - Bilateral 1.  Continue with Hydrofera Blue. ABDs and 4 layer compression bilaterally. 2. We should be closed on the posterior right next week 3. He has bilateral juxta lites I am hoping to at least get the right one in to a juxta light by next week Electronic Signature(s) Signed: 07/18/2019 6:17:56 PM By: [name] MD Entered By: [name] on 07/18/2019 09:00:10 -------------------------------------------------------------------------------- SuperBill Details Patient Name: Date of Service: [name], [name] 07/18/2019 Medical Record [id number] Patient Account Number: [id number] Date of Birth/Sex: Treating RN: [birth year] (78 y.o. [name]) Dolores Lory, [place] Primary Care Provider: Hurshel Gross Other Clinician: Referring Provider: Treating Provider/Extender:Omnia Dollinger, [name], [place] Weeks in Treatment: 20 Diagnosis Coding ICD-10 Codes Code  Description E11.51 Type 2 diabetes mellitus with diabetic peripheral angiopathy without gangrene L97.211 Non-pressure chronic ulcer of right calf limited to breakdown of skin E11.42 Type 2 diabetes mellitus with diabetic polyneuropathy L97.221 Non-pressure chronic ulcer of left calf limited to breakdown of skin I87.333 Chronic venous hypertension (idiopathic) with ulcer and inflammation of bilateral lower extremity Facility Procedures The patient participates with Medicare or their insurance follows the Medicare Facility Guidelines: CPT4 Description Modifier Quantity Code [id number] BILATERAL: Application of multi-layer venous compression system; leg 1 (below knee), including ankle and foot. Physician Procedures CPT4: Code [id number] Description: [id number] - WC PHYS LEVEL 3 - EST PT ICD-10 Diagnosis Description L97.221 Non-pressure chronic ulcer of left calf limited to break L97.211 Non-pressure chronic ulcer of right calf limited to brea I87.333 Chronic venous hypertension (idiopathic)  with ulcer and lower extremity Modifier: down of skin kdown of skin inflammation of Quantity: 1 bilateral Electronic Signature(s) Signed: 07/18/2019 6:17:56 PM By: [name] MD Entered By: [name] on 07/18/2019 09:00:50

## 2019-09-05 NOTE — Progress Notes (Signed)
[name], [name] ([name]) Visit Report for 08/29/2019 Debridement Details Patient Name: Date of Service: [name], [name] 08/29/2019 9:15 AM Medical Record [name] Patient Account Number: [id number] Date of Birth/Sex: Treating RN: [birth year] (78 y.o. [name]) [name] Primary Care Provider: Hurshel Gross Other Clinician: Referring Provider: Treating Provider/Extender:[name], [name], [name] Weeks in Treatment: 26 Debridement Performed for Wound #3R Right,Posterior Calf Assessment: Performed By: Physician [name]., MD Debridement Type: Debridement Severity of Tissue Pre Fat layer exposed Debridement: Level of Consciousness (Pre- Awake and Alert procedure): Pre-procedure Verification/Time Out Taken: Yes - 10:43 Start Time: 10:43 Pain Control: Lidocaine 5% topical ointment Total Area Debrided (L x W): 1.5 (cm) x 1.3 (cm) = 1.95 (cm) Tissue and other material Viable, Non-Viable, Slough, Subcutaneous, Slough debrided: Level: Skin/Subcutaneous Tissue Debridement Description: Excisional Instrument: Curette Bleeding: Moderate Hemostasis Achieved: Pressure End Time: 10:46 Procedural Pain: 0 Post Procedural Pain: 0 Response to Treatment: Procedure was tolerated well Level of Consciousness Awake and Alert (Post-procedure): Post Debridement Measurements of Total Wound Length: (cm) 1.5 Width: (cm) 1.3 Depth: (cm) 0.1 Volume: (cm) 0.153 Character of Wound/Ulcer Post Improved Debridement: Severity of Tissue Post Debridement: Fat layer exposed Post Procedure Diagnosis Same as Pre-procedure Electronic Signature(s) Signed: 08/29/2019 6:45:39 PM By: Daniel Ham MD Signed: 09/05/2019 2:56:39 PM By: Daniel Coria RN Entered By: [name] on 08/29/2019 10:49:50 -------------------------------------------------------------------------------- HPI Details Patient Name: Date of Service: Daniel Pia D. 08/29/2019 9:15 AM Medical Record  [name] Patient Account Number: [id number] Date of Birth/Sex: Treating RN: [birth year] (78 y.o. M) Primary Care Provider: Hurshel Gross Other Clinician: Referring Provider: Treating Provider/Extender:[name], [name], [name] Weeks in Treatment: 26 History of Present Illness Location: Patient presents with a wound to left lower leg. Quality: Patient reports No Pain. Duration: 2 months HPI Description: no cig or alcohol. spontaneous appearance in area of stasis dermamtitis. [name] on metformin only. chronic afib on Coumadin. diabetes and coag studies not good. hba1c 7.5. ivr 4.5. no pain or sxs of systemic disease. hx chf. no intermittent claudication 02/28/2019 Readmission This is a now a 78 year old man who was previously cared for in 2016 by Dr. Lindon Romp for wounds on his lower extremities. At that point he had venous reflux studies although I cannot seem to open these in [place] link. He had arterial studies showing an ABI of 1.11 on the right and 1.27 on the left his waveforms were triphasic bilaterally. He was discharged in stockings although I do not believe he is wearing these in some time. He tells me that about a month ago he noted openings of a large wound on the posterior right calf and 2 smaller areas on the left lateral calf and a small area more recently on the left posterior calf. He has been dressing these with peroxide and triple antibiotic ointment. He is not wearing compression. Past medical history; type 2 diabetes with peripheral neuropathy, chronic venous insufficiency, hypertension, cardiomyopathy, chronic atrial fibrillation on Coumadin, prostate cancer, hyperlipidemia, gout, ABI in our clinic was 1.34 on the left and not obtainable on the right 6/9; this is a patient who has chronic venous insufficiency. He has a fairly substantial area on the right posterior calf, left lateral calf and a small area on the left posterior calf. On arrival last week he  had very palpable popliteal and femoral pulses but nothing in his bilateral feet. Unfortunately we cannot get arterial studies until July 1 at Dr. Kennon Holter office. They live in [place]. We use silver alginate under Kerlix Coban 6/16; patient  with chronic venous insufficiency with wounds on his bilateral lower extremities. When he came into our clinic he was discovered to have a complete absence of peripheral pedal pulses at either the dorsalis pedis or posterior tibial. He does have easily palpable femoral and popliteal pulses. He sees Dr. [name] tomorrow for noninvasive arterial tests. He may also require venous reflux evaluation although I do not view this as an urgent thing. We have been using silver alginate. His wound surfaces of cleaned up quite nicely 6/23; patient with chronic venous insufficiency with wounds on his bilateral lower extremities. His wounds all are somewhat better looking. He did go to Dr. Kennon Holter office but somehow ended up on the doctors schedule rather than being scheduled for noninvasive tests therefore his noninvasive tests are scheduled for July 1. We agree that he has venous insufficiency ulcers but I cannot feel any pulses in his lower extremities dictating the need for test. We are only using Kerlix and light Coban unfortunately this appears to be holding the edema 6/30; has his arterial studies tomorrow. We have been using Kerlix and light Coban will go to a more aggressive compression if the arterial studies will allow. We all agreed these are venous wounds however I cannot feel pulses at either the dorsalis pedis or posterior tibial bilaterally. His wounds generally look some better including left lateral and right posterior. 7/7-Patient returns at 1 week in Kerlix/Coban to both legs, with improvement, in the left lateral and right posterior lower leg wounds, ABI's are normal in both legs per vascular studies, TBI is also normal on both sides, we are  using hydrofera blue to the wounds 7/14; patient's arterial studies from 2 weeks ago showed an ABI on the right at 1.03 with a TBI of 0.86. On the left the ABI was 1.06 with a TBI of 0.84. Notable for the fact that his arterial waveforms were monophasic in all of the lower extremity arteries suggesting some degree of arterial occlusive disease but in general this was felt to be fairly adequate for healing. His compression was increased from 2-3 layer which is appropriate. Dressing was changed to [facility] 7/21; patient's wounds are measuring smaller. The more substantial one on the right posterior calf, second 1 on the left lateral calf. Using [facility] on both wound areas 7/28; patient continues to make nice improvements. The area on the right posterior calf is smaller. Area on the left lateral calf also is smaller. We have been using Hydrofera Blue under compression. The patient will need compression stockings and we have measured him for these in the eventuality that these heal which really should not be too long from now 8/4-Patient continues to make improvement, the right posterior calf area smaller with rim of keratotic skin on one side, the left wound is definitely smaller and improving. 8/11-Returns at 1 week, after being in 3 layer compression on both legs, both wounds appear to be improving, making good progress, patient is happy, pain is also less especially in the right leg wound 8/18; the area on the left anterior lower leg is healed. On the right posterior leg the wound remains although the dimensions are a lot better. 8/25; he arrives in clinic today with a large body of open wound on the left lateral calf. All of the 3 wounds in this area are in close juxtaposition to each other. The story is that we discharged him last week with no a wrap on the left leg. They went to [facility]  could not get in as they are only excepting phone orders or online orders for stockings  hence they did not put any stocking on the left leg all week. They have something at home but the patient with that was either incapable or just did not put them on. Apparently these opened 1 morning after getting out of bed. The area on the right has no real change 9/1; patient has bilateral lower extremity wounds in the setting of severe chronic venous insufficiency and secondary lymphedema. He arrived last week with new areas on the left lateral lower leg after we did not wrap him and he did not use his stockings. Nevertheless the areas on the left look better today under compression. Posterior right calf does not really changed. We are using Hydrofera Blue on both areas under compression 9/15; bilateral lower extremity wounds in the setting of severe chronic venous insufficiency and secondary lymphedema. He has 20 to 30 mmHg below-knee compression stockings under the eventuality that these close over. We did get the left leg to close but he did not transition to a stocking and this reopened. There are 2 open areas on the left posterior lateral calf and one on the right. Both of these look satisfactory. Using [facility] 9/22; bilateral lower extremity wounds in the setting of chronic venous insufficiency. 2 superficial areas on the left lateral calf. One on the right just above the Achilles area. We have good edema control we have been using Hydrofera Blue 9/29; the areas on the left lateral calf are healed. On the right just above the Achilles and tendon area things look a lot better small wounds one scabbed area. We have been using Hydrofera Blue. We can discharge him in his own stocking on the left still wrapping on the right. This is the second time we have healed the left leg but he did not put a stocking on last time. Hopefully this will maintain the edema from chronic venous disease with secondary lymphedema 10/6; he comes in today having a stocking on the left leg. They had trouble  getting it on there is a lot of increase in swelling 2 small open areas one anteriorly and one on the medial calf. They report a lot of difficulty getting the stocking on. Paradoxically the area on the right that we have been wrapping posteriorly is closed 10/13; he comes in today with wounds bilaterally including superficial areas on the left medial and left lateral calf. As well as the right posterior has reopened in the Achilles area superiorly. He still does not have his juxta lite stockings although truthfully we would not of been able to use them today anyway. Apparently have been ordered and paid for from [employer] although they have not been delivered 10/20; his area on the right is just the boat closed on the right posterior. Still has the area on the left lateral and a very tiny area on the left medial. He has his bilateral juxta lites although he is not ready for them this week. He tolerated the increase to 4 layer compression last week quite well 10/27; the area on the right posterior calf is once again closed. He has a superficial area on the left lateral calf that is still open. He has been using Hydrofera Blue and bilateral 4-layer compression. He can change to his own juxta lite stocking on the right and we are instructing him today 11/3; the area on the right posterior calf reopened according to the patient  and his wife after they took off the stocking when they got home last week. Apparently scabbed over there is now a fairly substantial wound which looks pretty much the same. Our intake nurse noted that they were using the juxta lite stockings appropriately. I was really hoping I might be able to close him out today. He has 1 very tiny remaining area on the left lateral lower leg. 11/10; right posterior calf wound measures smaller but is still open. We have been using Hydrofera Blue. On the left he has a small oval-shaped wound and he seems to have had another wound distally that  is open and likely a blister. We are using Hydrofera Blue under compression 11/17; right posterior calf wound continues to get better. We have been using Hydrofera Blue. On the left lateral one of the wounds has closed still a small open area. We have been using Hydrofera Blue on this as well. Both areas have been under 4-layer compression Arrives in clinic today with some swelling in the dorsal foot on the right some erythema of his forefoot and toes. Initially when I looked at this I almost thought this was a sunburn distal to a wrap injury. 12/1; right posterior calf wound debrided with a curette. We have been using Hydrofera Blue on the left anterior lateral he has an area across the mid tibia. Finally a small area on the left lateral lower calf. Finally he continues to have de-epithelialized areas on the dorsal aspect of his toes. Initially thought this might be a burn injury when I saw him 2 weeks ago. I now wonder about tinea. I have also reviewed his arterial studies which were really quite good in July/20 with normal TBI's and ABIs but monophasic waveforms Electronic Signature(s) Signed: 08/29/2019 6:45:39 PM By: Daniel Ham MD Entered By: [name] on 08/29/2019 10:55:05 -------------------------------------------------------------------------------- Physical Exam Details Patient Name: Date of Service: [name], [name] 08/29/2019 9:15 AM Medical Record [name] Patient Account Number: [id number] Date of Birth/Sex: Treating RN: 13-Jan-1941 (78 y.o. M) Primary Care Provider: Hurshel Gross Other Clinician: Referring Provider: Treating Provider/Extender:Kavita Bartl, [name], [place] Weeks in Treatment: 108 Constitutional Patient is hypertensive.. Pulse regular and within target range for patient.Marland Kitchen Respirations regular, non-labored and within target range.. Temperature is normal and within the target range for the patient.Marland Kitchen Appears in no distress. Notes Wound  exam; this is more extensive on the left than when I saw him 2 weeks ago. But it is superficial. The right posterior calf wound requires debridement with a #5 curette very adherent fibrinous debris. He has a small area on the left lateral calf and then the de-epithelialized areas on his toes Electronic Signature(s) Signed: 08/29/2019 6:45:39 PM By: Daniel Ham MD Entered By: [name] on 08/29/2019 10:56:00 -------------------------------------------------------------------------------- Physician Orders Details Patient Name: Date of Service: Daniel Pia D. 08/29/2019 9:15 AM Medical Record [name] Patient Account Number: [id number] Date of Birth/Sex: Treating RN: Dec 10, 1940 (78 y.o. [name]) [name] Primary Care Provider: Other Clinician: Hurshel Gross Referring Provider: Treating Provider/Extender:Pragya Lofaso, [name], [place] Weeks in Treatment: 74 Verbal / Phone Orders: No Diagnosis Coding ICD-10 Coding Code Description E11.51 Type 2 diabetes mellitus with diabetic peripheral angiopathy without gangrene L97.211 Non-pressure chronic ulcer of right calf limited to breakdown of skin E11.42 Type 2 diabetes mellitus with diabetic polyneuropathy L97.221 Non-pressure chronic ulcer of left calf limited to breakdown of skin I87.333 Chronic venous hypertension (idiopathic) with ulcer and inflammation of bilateral lower extremity Follow-up Appointments Return Appointment in 1 week. Dressing Change  Frequency Wound #12 Left,Lateral Lower Leg Do not change entire dressing for one week. Wound #15 Left,Anterior Lower Leg Do not change entire dressing for one week. Wound #3R Right,Posterior Calf Do not change entire dressing for one week. Wound #13 Left Toe Second Change dressing every day. Wound #14 Left Toe Third Change dressing every day. Skin Barriers/Peri-Wound Care TCA Cream or Ointment Wound Cleansing May shower with protection. Primary Wound  Dressing Wound #12 Left,Lateral Lower Leg Hydrofera Blue Wound #15 Left,Anterior Lower Leg Hydrofera Blue Wound #3R Right,Posterior Calf Hydrofera Blue Wound #13 Left Toe Second Other: - ketoconasole Wound #14 Left Toe Third Other: - ketoconasole Secondary Dressing Wound #12 Left,Lateral Lower Leg Dry Gauze Wound #13 Left Toe Second Dry Gauze Wound #14 Left Toe Third Dry Gauze Wound #15 Left,Anterior Lower Leg Dry Gauze Wound #3R Right,Posterior Calf Dry Gauze Edema Control 3 Layer Compression System - Bilateral Electronic Signature(s) Signed: 08/29/2019 6:45:39 PM By: Daniel Ham MD Signed: 09/05/2019 2:56:39 PM By: Daniel Coria RN Entered By: [name] on 08/29/2019 10:53:10 -------------------------------------------------------------------------------- Problem List Details Patient Name: Date of Service: Daniel Pia D. 08/29/2019 9:15 AM Medical Record [id number] Patient Account Number: [id number] Date of Birth/Sex: Treating RN: Nov 07, 1940 (78 y.o. [name]) Dolores Lory, [place] Primary Care Provider: Hurshel Gross Other Clinician: Referring Provider: Treating Provider/Extender:Tanicia Wolaver, [name], [place] Weeks in Treatment: 26 Active Problems ICD-10 Evaluated Encounter Code Description Active Date Today Diagnosis E11.51 Type 2 diabetes mellitus with diabetic peripheral 02/28/2019 No Yes angiopathy without gangrene L97.211 Non-pressure chronic ulcer of right calf limited to 02/28/2019 No Yes breakdown of skin E11.42 Type 2 diabetes mellitus with diabetic polyneuropathy 02/28/2019 No Yes L97.221 Non-pressure chronic ulcer of left calf limited to 02/28/2019 No Yes breakdown of skin I87.333 Chronic venous hypertension (idiopathic) with ulcer 02/28/2019 No Yes and inflammation of bilateral lower extremity Inactive Problems Resolved Problems Electronic Signature(s) Signed: 08/29/2019 6:45:39 PM By: Daniel Ham MD Entered By: [name] on 08/29/2019  10:51:58 -------------------------------------------------------------------------------- Progress Note Details Patient Name: Date of Service: Daniel Pia D. 08/29/2019 9:15 AM Medical Record [id number] Patient Account Number: [id number] Date of Birth/Sex: Treating RN: [birth year] (78 y.o. M) Primary Care Provider: Hurshel Gross Other Clinician: Referring Provider: Treating Provider/Extender:Nary Sneed, [name], [place] Weeks in Treatment: 26 Subjective History of Present Illness (HPI) The following HPI elements were documented for the patient's wound: Location: Patient presents with a wound to left lower leg. Quality: Patient reports No Pain. Duration: 2 months no cig or alcohol. spontaneous appearance in area of stasis dermamtitis. [name] on metformin only. chronic afib on Coumadin. diabetes and coag studies not good. hba1c 7.5. ivr 4.5. no pain or sxs of systemic disease. hx chf. no intermittent claudication 02/28/2019 Readmission This is a now a 78 year old man who was previously cared for in 2016 by Dr. Lindon Romp for wounds on his lower extremities. At that point he had venous reflux studies although I cannot seem to open these in [place] link. He had arterial studies showing an ABI of 1.11 on the right and 1.27 on the left his waveforms were triphasic bilaterally. He was discharged in stockings although I do not believe he is wearing these in some time. He tells me that about a month ago he noted openings of a large wound on the posterior right calf and 2 smaller areas on the left lateral calf and a small area more recently on the left posterior calf. He has been dressing these with peroxide and triple antibiotic ointment. He is not wearing compression. Past medical history;  type 2 diabetes with peripheral neuropathy, chronic venous insufficiency, hypertension, cardiomyopathy, chronic atrial fibrillation on Coumadin, prostate cancer, hyperlipidemia, gout, ABI in our  clinic was 1.34 on the left and not obtainable on the right 6/9; this is a patient who has chronic venous insufficiency. He has a fairly substantial area on the right posterior calf, left lateral calf and a small area on the left posterior calf. On arrival last week he had very palpable popliteal and femoral pulses but nothing in his bilateral feet. Unfortunately we cannot get arterial studies until July 1 at Dr. Kennon Holter office. They live in [place]. We use silver alginate under Kerlix Coban 6/16; patient with chronic venous insufficiency with wounds on his bilateral lower extremities. When he came into our clinic he was discovered to have a complete absence of peripheral pedal pulses at either the dorsalis pedis or posterior tibial. He does have easily palpable femoral and popliteal pulses. He sees Dr. [name] tomorrow for noninvasive arterial tests. He may also require venous reflux evaluation although I do not view this as an urgent thing. We have been using silver alginate. His wound surfaces of cleaned up quite nicely 6/23; patient with chronic venous insufficiency with wounds on his bilateral lower extremities. His wounds all are somewhat better looking. He did go to Dr. Kennon Holter office but somehow ended up on the doctors schedule rather than being scheduled for noninvasive tests therefore his noninvasive tests are scheduled for July 1. We agree that he has venous insufficiency ulcers but I cannot feel any pulses in his lower extremities dictating the need for test. We are only using Kerlix and light Coban unfortunately this appears to be holding the edema 6/30; has his arterial studies tomorrow. We have been using Kerlix and light Coban will go to a more aggressive compression if the arterial studies will allow. We all agreed these are venous wounds however I cannot feel pulses at either the dorsalis pedis or posterior tibial bilaterally. His wounds generally look some better including left  lateral and right posterior. 7/7-Patient returns at 1 week in Kerlix/Coban to both legs, with improvement, in the left lateral and right posterior lower leg wounds, ABI's are normal in both legs per vascular studies, TBI is also normal on both sides, we are using hydrofera blue to the wounds 7/14; patient's arterial studies from 2 weeks ago showed an ABI on the right at 1.03 with a TBI of 0.86. On the left the ABI was 1.06 with a TBI of 0.84. Notable for the fact that his arterial waveforms were monophasic in all of the lower extremity arteries suggesting some degree of arterial occlusive disease but in general this was felt to be fairly adequate for healing. His compression was increased from 2-3 layer which is appropriate. Dressing was changed to [facility] 7/21; patient's wounds are measuring smaller. The more substantial one on the right posterior calf, second 1 on the left lateral calf. Using [facility] on both wound areas 7/28; patient continues to make nice improvements. The area on the right posterior calf is smaller. Area on the left lateral calf also is smaller. We have been using Hydrofera Blue under compression. The patient will need compression stockings and we have measured him for these in the eventuality that these heal which really should not be too long from now 8/4-Patient continues to make improvement, the right posterior calf area smaller with rim of keratotic skin on one side, the left wound is definitely smaller and improving. 8/11-Returns at  1 week, after being in 3 layer compression on both legs, both wounds appear to be improving, making good progress, patient is happy, pain is also less especially in the right leg wound 8/18; the area on the left anterior lower leg is healed. On the right posterior leg the wound remains although the dimensions are a lot better. 8/25; he arrives in clinic today with a large body of open wound on the left lateral calf. All of  the 3 wounds in this area are in close juxtaposition to each other. The story is that we discharged him last week with no a wrap on the left leg. They went to [place] could not get in as they are only excepting phone orders or online orders for stockings hence they did not put any stocking on the left leg all week. They have something at home but the patient with that was either incapable or just did not put them on. Apparently these opened 1 morning after getting out of bed. The area on the right has no real change 9/1; patient has bilateral lower extremity wounds in the setting of severe chronic venous insufficiency and secondary lymphedema. He arrived last week with new areas on the left lateral lower leg after we did not wrap him and he did not use his stockings. Nevertheless the areas on the left look better today under compression. Posterior right calf does not really changed. We are using Hydrofera Blue on both areas under compression 9/15; bilateral lower extremity wounds in the setting of severe chronic venous insufficiency and secondary lymphedema. He has 20 to 30 mmHg below-knee compression stockings under the eventuality that these close over. We did get the left leg to close but he did not transition to a stocking and this reopened. There are 2 open areas on the left posterior lateral calf and one on the right. Both of these look satisfactory. Using [facility] 9/22; bilateral lower extremity wounds in the setting of chronic venous insufficiency. 2 superficial areas on the left lateral calf. One on the right just above the Achilles area. We have good edema control we have been using Hydrofera Blue 9/29; the areas on the left lateral calf are healed. On the right just above the Achilles and tendon area things look a lot better small wounds one scabbed area. We have been using Hydrofera Blue. We can discharge him in his own stocking on the left still wrapping on the right. This is  the second time we have healed the left leg but he did not put a stocking on last time. Hopefully this will maintain the edema from chronic venous disease with secondary lymphedema 10/6; he comes in today having a stocking on the left leg. They had trouble getting it on there is a lot of increase in swelling 2 small open areas one anteriorly and one on the medial calf. They report a lot of difficulty getting the stocking on. ooParadoxically the area on the right that we have been wrapping posteriorly is closed 10/13; he comes in today with wounds bilaterally including superficial areas on the left medial and left lateral calf. As well as the right posterior has reopened in the Achilles area superiorly. He still does not have his juxta lite stockings although truthfully we would not of been able to use them today anyway. Apparently have been ordered and paid for from [employer] although they have not been delivered 10/20; his area on the right is just the boat closed on the  right posterior. Still has the area on the left lateral and a very tiny area on the left medial. He has his bilateral juxta lites although he is not ready for them this week. He tolerated the increase to 4 layer compression last week quite well 10/27; the area on the right posterior calf is once again closed. He has a superficial area on the left lateral calf that is still open. He has been using Hydrofera Blue and bilateral 4-layer compression. He can change to his own juxta lite stocking on the right and we are instructing him today 11/3; the area on the right posterior calf reopened according to the patient and his wife after they took off the stocking when they got home last week. Apparently scabbed over there is now a fairly substantial wound which looks pretty much the same. Our intake nurse noted that they were using the juxta lite stockings appropriately. I was really hoping I might be able to close him out today. He has 1  very tiny remaining area on the left lateral lower leg. 11/10; right posterior calf wound measures smaller but is still open. We have been using Hydrofera Blue. On the left he has a small oval-shaped wound and he seems to have had another wound distally that is open and likely a blister. We are using Hydrofera Blue under compression 11/17; right posterior calf wound continues to get better. We have been using Hydrofera Blue. On the left lateral one of the wounds has closed still a small open area. We have been using Hydrofera Blue on this as well. Both areas have been under 4-layer compression Arrives in clinic today with some swelling in the dorsal foot on the right some erythema of his forefoot and toes. Initially when I looked at this I almost thought this was a sunburn distal to a wrap injury. 12/1; right posterior calf wound debrided with a curette. We have been using Hydrofera Blue on the left anterior lateral he has an area across the mid tibia. Finally a small area on the left lateral lower calf. Finally he continues to have de-epithelialized areas on the dorsal aspect of his toes. Initially thought this might be a burn injury when I saw him 2 weeks ago. I now wonder about tinea. I have also reviewed his arterial studies which were really quite good in July/20 with normal TBI's and ABIs but monophasic waveforms Objective Constitutional Patient is hypertensive.. Pulse regular and within target range for patient.Marland Kitchen Respirations regular, non-labored and within target range.. Temperature is normal and within the target range for the patient.Marland Kitchen Appears in no distress. Vitals Time Taken: 9:33 AM, Height: 74 in, Weight: 212 lbs, BMI: 27.2, Temperature: 97.6 F, Pulse: 66 bpm, Respiratory Rate: 18 breaths/min, Blood Pressure: 149/79 mmHg. General Notes: Wound exam; this is more extensive on the left than when I saw him 2 weeks ago. But it is superficial. ooThe right posterior calf wound  requires debridement with a #5 curette very adherent fibrinous debris. ooHe has a small area on the left lateral calf and then the de-epithelialized areas on his toes Integumentary (Hair, Skin) Wound #12 status is Open. Original cause of wound was Blister. The wound is located on the Left,Lateral Lower Leg. The wound measures 0.2cm length x 0.2cm width x 0.1cm depth; 0.031cm^2 area and 0.003cm^3 volume. There is Fat Layer (Subcutaneous Tissue) Exposed exposed. There is no tunneling or undermining noted. There is a medium amount of drainage noted. There is large (67-100%) red, pink,  pale granulation within the wound bed. Wound #13 status is Open. Original cause of wound was Gradually Appeared. The wound is located on the Left Toe Second. The wound measures 1.7cm length x 2.5cm width x 0.1cm depth; 3.338cm^2 area and 0.334cm^3 volume. There is no tunneling or undermining noted. There is a medium amount of serosanguineous drainage noted. The wound margin is distinct with the outline attached to the wound base. There is large (67-100%) red, pink granulation within the wound bed. There is no necrotic tissue within the wound bed. Wound #14 status is Open. Original cause of wound was Gradually Appeared. The wound is located on the Left Toe Third. The wound measures 0.5cm length x 0.5cm width x 0.1cm depth; 0.196cm^2 area and 0.02cm^3 volume. There is no tunneling or undermining noted. There is a medium amount of serosanguineous drainage noted. The wound margin is distinct with the outline attached to the wound base. There is large (67-100%) red, pink granulation within the wound bed. There is no necrotic tissue within the wound bed. Wound #15 status is Open. Original cause of wound was Blister. The wound is located on the Left,Anterior Lower Leg. The wound measures 3cm length x 3.5cm width x 0.1cm depth; 8.247cm^2 area and 0.825cm^3 volume. There is Fat Layer (Subcutaneous Tissue) Exposed exposed. There  is no tunneling or undermining noted. There is a medium amount of serosanguineous drainage noted. There is large (67-100%) red granulation within the wound bed. There is no necrotic tissue within the wound bed. Wound #3R status is Open. Original cause of wound was Gradually Appeared. The wound is located on the Right,Posterior Calf. The wound measures 1.5cm length x 1.3cm width x 0.1cm depth; 1.532cm^2 area and 0.153cm^3 volume. There is Fat Layer (Subcutaneous Tissue) Exposed exposed. There is no tunneling or undermining noted. There is a medium amount of serosanguineous drainage noted. There is large (67-100%) red, pink, pale granulation within the wound bed. 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 There was a Excisional Skin/Subcutaneous Tissue Debridement with a total area of 1.95 sq cm performed by [name]., MD. With the following instrument(s): Curette to remove Viable and Non-Viable tissue/material. Material removed includes Subcutaneous Tissue and Slough and after achieving pain control using Lidocaine 5% topical ointment. No specimens were taken. A time out was conducted at 10:43, prior to the start of the procedure. A Moderate amount of bleeding was controlled with Pressure. The procedure was tolerated well with a pain level of 0 throughout and a pain level of 0 following the procedure. Post Debridement Measurements:  1.5cm length x 1.3cm width x 0.1cm depth; 0.153cm^3 volume. Character of Wound/Ulcer Post Debridement is improved. Severity of Tissue Post Debridement is: Fat layer exposed. Post procedure Diagnosis Wound #3R: Same as Pre-Procedure Plan Follow-up Appointments: Return Appointment in 1 week. Dressing Change Frequency: Wound #12 Left,Lateral Lower Leg: Do not change entire dressing for one week. Wound #15 Left,Anterior Lower Leg: Do not change entire dressing for one week. Wound #3R Right,Posterior Calf: Do not change entire dressing for one week. Wound #  13 Left Toe Second: Change dressing every day. Wound #14 Left Toe Third: Change dressing every day. Skin Barriers/Peri-Wound Care: TCA Cream or Ointment Wound Cleansing: May shower with protection. Primary Wound Dressing: Wound #12 Left,Lateral Lower Leg: Hydrofera Blue Wound #15 Left,Anterior Lower Leg: Hydrofera Blue Wound #3R Right,Posterior Calf: Hydrofera Blue Wound #13 Left Toe Second: Other: - ketoconasole Wound #14 Left Toe Third: Other: - ketoconasole Secondary Dressing: Wound #12 Left,Lateral Lower Leg: Dry Gauze Wound #13 Left Toe Second: Dry Gauze Wound #14 Left Toe Third: Dry Gauze Wound #15 Left,Anterior Lower Leg: Dry Gauze Wound #3R Right,Posterior Calf: Dry Gauze Edema Control: 3 Layer Compression System - Bilateral 1. We continued with Hydrofera Blue to all areas 2. I found myself concerned about his arterial status today his foot was cold but his pedal pulses were palpable on both sides 3. I also started wondering about tinea. We applied kidney of ketoconazole to the toes asked her to use Lotrimin. 4. I have backed off from 4-3 layer compression Electronic Signature(s) Signed: 08/29/2019 6:45:39 PM By: Daniel Ham MD Entered By: [name] on 08/29/2019 10:56:56 -------------------------------------------------------------------------------- SuperBill Details Patient Name: Date of  Service: CASEY, SADLOWSKI 08/29/2019 Medical Record [name] Patient Account Number: [id number] Date of Birth/Sex: Treating RN: 18-Jan-1941 (78 y.o. M) Primary Care Provider: Hurshel Gross Other Clinician: Referring Provider: Treating Provider/Extender:Kooper Godshall, [name], [place] Weeks in Treatment: 26 Diagnosis Coding ICD-10 Codes Code Description E11.51 Type 2 diabetes mellitus with diabetic peripheral angiopathy without gangrene L97.211 Non-pressure chronic ulcer of right calf limited to breakdown of skin E11.42 Type 2 diabetes mellitus with diabetic polyneuropathy L97.221 Non-pressure chronic ulcer of left calf limited to breakdown of skin I87.333 Chronic venous hypertension (idiopathic) with ulcer and inflammation of bilateral lower extremity Facility Procedures The patient participates with Medicare or their insurance follows the Medicare Facility Guidelines: CPT4 Code Description Modifier Quantity [id number] - DEB SUBQ TISSUE 20 SQ CM/< 1 ICD-10 Diagnosis Description L97.211 Non-pressure chronic ulcer of  right calf limited to breakdown of skin Physician Procedures CPT4 Code Description: [id number] - WC PHYS SUBQ TISS 20 SQ CM ICD-10 Diagnosis Description L97.211 Non-pressure chronic ulcer of right calf limited to brea Modifier: kdown of skin Quantity: 1 Electronic Signature(s) Signed: 08/29/2019 6:45:39 PM By: Daniel Ham MD Entered By: [name] on 08/29/2019 10:57:13

## 2019-09-05 NOTE — Progress Notes (Signed)
Daniel, Gross ([id number]) Visit Report for 07/25/2019 HPI Details Patient Name: Date of Service: [name], [name] 07/25/2019 8:00 AM Medical Record [name] Patient Account Number: [id number] Date of Birth/Sex: Treating RN: 1940-10-26 (78 y.o. [name]) [name] Primary Care Provider: Hurshel Party Other Clinician: Referring Provider: Treating Provider/Extender:Robson, Cheryl Flash, [place] Weeks in Treatment: 21 History of Present Illness Location: Patient presents with a wound to left lower leg. Quality: Patient reports No Pain. Duration: 2 months HPI Description: no cig or alcohol. spontaneous appearance in area of stasis dermamtitis. [name] on metformin only. chronic afib on Coumadin. diabetes and coag studies not good. hba1c 7.5. ivr 4.5. no pain or sxs of systemic disease. hx chf. no intermittent claudication 02/28/2019 Readmission This is a now a 78 year old man who was previously cared for in 2016 by Dr. Lindon Romp for wounds on his lower extremities. At that point he had venous reflux studies although I cannot seem to open these in [place] link. He had arterial studies showing an ABI of 1.11 on the right and 1.27 on the left his waveforms were triphasic bilaterally. He was discharged in stockings although I do not believe he is wearing these in some time. He tells me that about a month ago he noted openings of a large wound on the posterior right calf and 2 smaller areas on the left lateral calf and a small area more recently on the left posterior calf. He has been dressing these with peroxide and triple antibiotic ointment. He is not wearing compression. Past medical history; type 2 diabetes with peripheral neuropathy, chronic venous insufficiency, hypertension, cardiomyopathy, chronic atrial fibrillation on Coumadin, prostate cancer, hyperlipidemia, gout, ABI in our clinic was 1.34 on the left and not obtainable on the right 6/9; this is a patient who has chronic  venous insufficiency. He has a fairly substantial area on the right posterior calf, left lateral calf and a small area on the left posterior calf. On arrival last week he had very palpable popliteal and femoral pulses but nothing in his bilateral feet. Unfortunately we cannot get arterial studies until July 1 at Dr. Kennon Holter office. They live in [place]. We use silver alginate under Kerlix Coban 6/16; patient with chronic venous insufficiency with wounds on his bilateral lower extremities. When he came into our clinic he was discovered to have a complete absence of peripheral pedal pulses at either the dorsalis pedis or posterior tibial. He does have easily palpable femoral and popliteal pulses. He sees Dr. [name] tomorrow for noninvasive arterial tests. He may also require venous reflux evaluation although I do not view this as an urgent thing. We have been using silver alginate. His wound surfaces of cleaned up quite nicely 6/23; patient with chronic venous insufficiency with wounds on his bilateral lower extremities. His wounds all are somewhat better looking. He did go to Dr. Kennon Holter office but somehow ended up on the doctors schedule rather than being scheduled for noninvasive tests therefore his noninvasive tests are scheduled for July 1. We agree that he has venous insufficiency ulcers but I cannot feel any pulses in his lower extremities dictating the need for test. We are only using Kerlix and light Coban unfortunately this appears to be holding the edema 6/30; has his arterial studies tomorrow. We have been using Kerlix and light Coban will go to a more aggressive compression if the arterial studies will allow. We all agreed these are venous wounds however I cannot feel pulses at either the dorsalis pedis or posterior tibial  bilaterally. His wounds generally look some better including left lateral and right posterior. 7/7-Patient returns at 1 week in Kerlix/Coban to both legs, with  improvement, in the left lateral and right posterior lower leg wounds, ABI's are normal in both legs per vascular studies, TBI is also normal on both sides, we are using hydrofera blue to the wounds 7/14; patient's arterial studies from 2 weeks ago showed an ABI on the right at 1.03 with a TBI of 0.86. On the left the ABI was 1.06 with a TBI of 0.84. Notable for the fact that his arterial waveforms were monophasic in all of the lower extremity arteries suggesting some degree of arterial occlusive disease but in general this was felt to be fairly adequate for healing. His compression was increased from 2-3 layer which is appropriate. Dressing was changed to [facility] 7/21; patient's wounds are measuring smaller. The more substantial one on the right posterior calf, second 1 on the left lateral calf. Using [facility] on both wound areas 7/28; patient continues to make nice improvements. The area on the right posterior calf is smaller. Area on the left lateral calf also is smaller. We have been using Hydrofera Blue under compression. The patient will need compression stockings and we have measured him for these in the eventuality that these heal which really should not be too long from now 8/4-Patient continues to make improvement, the right posterior calf area smaller with rim of keratotic skin on one side, the left wound is definitely smaller and improving. 8/11-Returns at 1 week, after being in 3 layer compression on both legs, both wounds appear to be improving, making good progress, patient is happy, pain is also less especially in the right leg wound 8/18; the area on the left anterior lower leg is healed. On the right posterior leg the wound remains although the dimensions are a lot better. 8/25; he arrives in clinic today with a large body of open wound on the left lateral calf. All of the 3 wounds in this area are in close juxtaposition to each other. The story is that we  discharged him last week with no a wrap on the left leg. They went to [place] could not get in as they are only excepting phone orders or online orders for stockings hence they did not put any stocking on the left leg all week. They have something at home but the patient with that was either incapable or just did not put them on. Apparently these opened 1 morning after getting out of bed. The area on the right has no real change 9/1; patient has bilateral lower extremity wounds in the setting of severe chronic venous insufficiency and secondary lymphedema. He arrived last week with new areas on the left lateral lower leg after we did not wrap him and he did not use his stockings. Nevertheless the areas on the left look better today under compression. Posterior right calf does not really changed. We are using Hydrofera Blue on both areas under compression 9/15; bilateral lower extremity wounds in the setting of severe chronic venous insufficiency and secondary lymphedema. He has 20 to 30 mmHg below-knee compression stockings under the eventuality that these close over. We did get the left leg to close but he did not transition to a stocking and this reopened. There are 2 open areas on the left posterior lateral calf and one on the right. Both of these look satisfactory. Using [facility] 9/22; bilateral lower extremity wounds in the  setting of chronic venous insufficiency. 2 superficial areas on the left lateral calf. One on the right just above the Achilles area. We have good edema control we have been using Hydrofera Blue 9/29; the areas on the left lateral calf are healed. On the right just above the Achilles and tendon area things look a lot better small wounds one scabbed area. We have been using Hydrofera Blue. We can discharge him in his own stocking on the left still wrapping on the right. This is the second time we have healed the left leg but he did not put a stocking on last time.  Hopefully this will maintain the edema from chronic venous disease with secondary lymphedema 10/6; he comes in today having a stocking on the left leg. They had trouble getting it on there is a lot of increase in swelling 2 small open areas one anteriorly and one on the medial calf. They report a lot of difficulty getting the stocking on. Paradoxically the area on the right that we have been wrapping posteriorly is closed 10/13; he comes in today with wounds bilaterally including superficial areas on the left medial and left lateral calf. As well as the right posterior has reopened in the Achilles area superiorly. He still does not have his juxta lite stockings although truthfully we would not of been able to use them today anyway. Apparently have been ordered and paid for from [facility] although they have not been delivered 10/20; his area on the right is just the boat closed on the right posterior. Still has the area on the left lateral and a very tiny area on the left medial. He has his bilateral juxta lites although he is not ready for them this week. He tolerated the increase to 4 layer compression last week quite well 10/27; the area on the right posterior calf is once again closed. He has a superficial area on the left lateral calf that is still open. He has been using Hydrofera Blue and bilateral 4-layer compression. He can change to his own juxta lite stocking on the right and we are instructing him today Electronic Signature(s) Signed: 07/25/2019 6:05:35 PM By: [name] MD Entered By: [name] on 07/25/2019 08:53:25 -------------------------------------------------------------------------------- Physical Exam Details Patient Name: Date of Service: [name], [name] 07/25/2019 8:00 AM Medical Record [id number] Patient Account Number: [id number] Date of Birth/Sex: Treating RN: 12-Nov-1940 (78 y.o. [name] Primary Care Provider: Hurshel Party Other  Clinician: Referring Provider: Treating Provider/Extender:Robson, Cheryl Flash, [place] Weeks in Treatment: 21 Constitutional hypertensive. Pulse regular and within target range for patient.Marland Kitchen Respirations regular, non-labored and within target range.. Temperature is normal and within the target range for the patient.Marland Kitchen Appears in no distress. Respiratory work of breathing is normal. Cardiovascular Pedal pulses palpable and strong bilaterally.. Edema is well controlled bilaterally. Integumentary (Hair, Skin) Diffuse hemosiderin deposition in both lower extremities tightly fibrotic adherent skin. Psychiatric appears at normal baseline. Notes Wound exam; right posterior lower leg wound is closed on the left there is superficial wound that is just about 100% epithelialized laterally. The medial area from last week is healed Electronic Signature(s) Signed: 07/25/2019 6:05:35 PM By: [name] MD Entered By: [name] on 07/25/2019 08:54:57 -------------------------------------------------------------------------------- Physician Orders Details Patient Name: Date of Service: ARZELL, HALL 07/25/2019 8:00 AM Medical Record [id number] Patient Account Number: [id number] Date of Birth/Sex: Treating RN: 01-Aug-1941 (78 y.o. [name]) [name] Primary Care Provider: Hurshel Party Other Clinician: Referring Provider: Treating Provider/Extender:Robson, Cheryl Flash, [place] Weeks in  Treatment: 21 Verbal / Phone Orders: No Diagnosis Coding ICD-10 Coding Code Description E11.51 Type 2 diabetes mellitus with diabetic peripheral angiopathy without gangrene L97.211 Non-pressure chronic ulcer of right calf limited to breakdown of skin E11.42 Type 2 diabetes mellitus with diabetic polyneuropathy L97.221 Non-pressure chronic ulcer of left calf limited to breakdown of skin I87.333 Chronic venous hypertension (idiopathic) with ulcer and inflammation of bilateral lower  extremity Follow-up Appointments Return Appointment in 1 week. Dressing Change Frequency Do not change entire dressing for one week. Skin Barriers/Peri-Wound Care TCA Cream or Ointment Wound Cleansing May shower with protection. Primary Wound Dressing Wound #9R Left,Distal,Lateral Lower Leg Hydrofera Blue Secondary Dressing Dry Gauze Edema Control 4 layer compression: Left lower extremity Support Garment 20-30 mm/Hg pressure to: - circaid juxtalite to right , on in the am, off in the pm Electronic Signature(s) Signed: 07/25/2019 6:05:35 PM By: [name] MD Signed: 09/05/2019 3:01:15 PM By: [name] RN Entered By: [name] on 07/25/2019 08:50:36 -------------------------------------------------------------------------------- Problem List Details Patient Name: Date of Service: Daniel Pia D. 07/25/2019 8:00 AM Medical Record [id number] Patient Account Number: [id number] Date of Birth/Sex: Treating RN: [birth year] (78 y.o. [name]) Dolores Lory, [place] Primary Care Provider: Hurshel Party Other Clinician: Referring Provider: Treating Provider/Extender:Robson, Cheryl Flash, [place] Weeks in Treatment: 21 Active Problems ICD-10 Evaluated Encounter Code Description Active Date Today Diagnosis E11.51 Type 2 diabetes mellitus with diabetic peripheral 02/28/2019 No Yes angiopathy without gangrene E11.42 Type 2 diabetes mellitus with diabetic polyneuropathy 02/28/2019 No Yes L97.221 Non-pressure chronic ulcer of left calf limited to 02/28/2019 No Yes breakdown of skin I87.333 Chronic venous hypertension (idiopathic) with ulcer 02/28/2019 No Yes and inflammation of bilateral lower extremity Inactive Problems ICD-10 Code Description Active Date Inactive Date L97.211 Non-pressure chronic ulcer of right calf limited to breakdown of 02/28/2019 02/28/2019 skin Resolved Problems Electronic Signature(s) Signed: 07/25/2019 6:05:35 PM By: [name] MD Entered By: [name]  on 07/25/2019 08:52:44 -------------------------------------------------------------------------------- Progress Note Details Patient Name: Date of Service: Daniel Pia D. 07/25/2019 8:00 AM Medical Record [id number] Patient Account Number: [id number] Date of Birth/Sex: Treating RN: March 31, 1941 (78 y.o. [name]) [name] Primary Care Provider: Hurshel Party Other Clinician: Referring Provider: Treating Provider/Extender:Robson, Cheryl Flash, [place] Weeks in Treatment: 21 Subjective History of Present Illness (HPI) The following HPI elements were documented for the patient's wound: Location: Patient presents with a wound to left lower leg. Quality: Patient reports No Pain. Duration: 2 months no cig or alcohol. spontaneous appearance in area of stasis dermamtitis. [name] on metformin only. chronic afib on Coumadin. diabetes and coag studies not good. hba1c 7.5. ivr 4.5. no pain or sxs of systemic disease. hx chf. no intermittent claudication 02/28/2019 Readmission This is a now a 78 year old man who was previously cared for in 2016 by Dr. Lindon Romp for wounds on his lower extremities. At that point he had venous reflux studies although I cannot seem to open these in [place] link. He had arterial studies showing an ABI of 1.11 on the right and 1.27 on the left his waveforms were triphasic bilaterally. He was discharged in stockings although I do not believe he is wearing these in some time. He tells me that about a month ago he noted openings of a large wound on the posterior right calf and 2 smaller areas on the left lateral calf and a small area more recently on the left posterior calf. He has been dressing these with peroxide and triple antibiotic ointment. He is not wearing compression. Past medical history; type 2 diabetes  with peripheral neuropathy, chronic venous insufficiency, hypertension, cardiomyopathy, chronic atrial fibrillation on Coumadin, prostate cancer,  hyperlipidemia, gout, ABI in our clinic was 1.34 on the left and not obtainable on the right 6/9; this is a patient who has chronic venous insufficiency. He has a fairly substantial area on the right posterior calf, left lateral calf and a small area on the left posterior calf. On arrival last week he had very palpable popliteal and femoral pulses but nothing in his bilateral feet. Unfortunately we cannot get arterial studies until July 1 at Dr. Kennon Holter office. They live in [place]. We use silver alginate under Kerlix Coban 6/16; patient with chronic venous insufficiency with wounds on his bilateral lower extremities. When he came into our clinic he was discovered to have a complete absence of peripheral pedal pulses at either the dorsalis pedis or posterior tibial. He does have easily palpable femoral and popliteal pulses. He sees Dr. [name] tomorrow for noninvasive arterial tests. He may also require venous reflux evaluation although I do not view this as an urgent thing. We have been using silver alginate. His wound surfaces of cleaned up quite nicely 6/23; patient with chronic venous insufficiency with wounds on his bilateral lower extremities. His wounds all are somewhat better looking. He did go to Dr. Kennon Holter office but somehow ended up on the doctors schedule rather than being scheduled for noninvasive tests therefore his noninvasive tests are scheduled for July 1. We agree that he has venous insufficiency ulcers but I cannot feel any pulses in his lower extremities dictating the need for test. We are only using Kerlix and light Coban unfortunately this appears to be holding the edema 6/30; has his arterial studies tomorrow. We have been using Kerlix and light Coban will go to a more aggressive compression if the arterial studies will allow. We all agreed these are venous wounds however I cannot feel pulses at either the dorsalis pedis or posterior tibial bilaterally. His wounds generally  look some better including left lateral and right posterior. 7/7-Patient returns at 1 week in Kerlix/Coban to both legs, with improvement, in the left lateral and right posterior lower leg wounds, ABI's are normal in both legs per vascular studies, TBI is also normal on both sides, we are using hydrofera blue to the wounds 7/14; patient's arterial studies from 2 weeks ago showed an ABI on the right at 1.03 with a TBI of 0.86. On the left the ABI was 1.06 with a TBI of 0.84. Notable for the fact that his arterial waveforms were monophasic in all of the lower extremity arteries suggesting some degree of arterial occlusive disease but in general this was felt to be fairly adequate for healing. His compression was increased from 2-3 layer which is appropriate. Dressing was changed to [facility] 7/21; patient's wounds are measuring smaller. The more substantial one on the right posterior calf, second 1 on the left lateral calf. Using [facility] on both wound areas 7/28; patient continues to make nice improvements. The area on the right posterior calf is smaller. Area on the left lateral calf also is smaller. We have been using Hydrofera Blue under compression. The patient will need compression stockings and we have measured him for these in the eventuality that these heal which really should not be too long from now 8/4-Patient continues to make improvement, the right posterior calf area smaller with rim of keratotic skin on one side, the left wound is definitely smaller and improving. 8/11-Returns at 1 week, after  being in 3 layer compression on both legs, both wounds appear to be improving, making good progress, patient is happy, pain is also less especially in the right leg wound 8/18; the area on the left anterior lower leg is healed. On the right posterior leg the wound remains although the dimensions are a lot better. 8/25; he arrives in clinic today with a large body of open wound on  the left lateral calf. All of the 3 wounds in this area are in close juxtaposition to each other. The story is that we discharged him last week with no a wrap on the left leg. They went to [place] could not get in as they are only excepting phone orders or online orders for stockings hence they did not put any stocking on the left leg all week. They have something at home but the patient with that was either incapable or just did not put them on. Apparently these opened 1 morning after getting out of bed. The area on the right has no real change 9/1; patient has bilateral lower extremity wounds in the setting of severe chronic venous insufficiency and secondary lymphedema. He arrived last week with new areas on the left lateral lower leg after we did not wrap him and he did not use his stockings. Nevertheless the areas on the left look better today under compression. Posterior right calf does not really changed. We are using Hydrofera Blue on both areas under compression 9/15; bilateral lower extremity wounds in the setting of severe chronic venous insufficiency and secondary lymphedema. He has 20 to 30 mmHg below-knee compression stockings under the eventuality that these close over. We did get the left leg to close but he did not transition to a stocking and this reopened. There are 2 open areas on the left posterior lateral calf and one on the right. Both of these look satisfactory. Using [facility] 9/22; bilateral lower extremity wounds in the setting of chronic venous insufficiency. 2 superficial areas on the left lateral calf. One on the right just above the Achilles area. We have good edema control we have been using Hydrofera Blue 9/29; the areas on the left lateral calf are healed. On the right just above the Achilles and tendon area things look a lot better small wounds one scabbed area. We have been using Hydrofera Blue. We can discharge him in his own stocking on the left still  wrapping on the right. This is the second time we have healed the left leg but he did not put a stocking on last time. Hopefully this will maintain the edema from chronic venous disease with secondary lymphedema 10/6; he comes in today having a stocking on the left leg. They had trouble getting it on there is a lot of increase in swelling 2 small open areas one anteriorly and one on the medial calf. They report a lot of difficulty getting the stocking on. ooParadoxically the area on the right that we have been wrapping posteriorly is closed 10/13; he comes in today with wounds bilaterally including superficial areas on the left medial and left lateral calf. As well as the right posterior has reopened in the Achilles area superiorly. He still does not have his juxta lite stockings although truthfully we would not of been able to use them today anyway. Apparently have been ordered and paid for from [employer] although they have not been delivered 10/20; his area on the right is just the boat closed on the right posterior. Still  has the area on the left lateral and a very tiny area on the left medial. He has his bilateral juxta lites although he is not ready for them this week. He tolerated the increase to 4 layer compression last week quite well 10/27; the area on the right posterior calf is once again closed. He has a superficial area on the left lateral calf that is still open. He has been using Hydrofera Blue and bilateral 4-layer compression. He can change to his own juxta lite stocking on the right and we are instructing him today Objective Constitutional hypertensive. Pulse regular and within target range for patient.Marland Kitchen Respirations regular, non-labored and within target range.. Temperature is normal and within the target range for the patient.Marland Kitchen Appears in no distress. Vitals Time Taken: 8:00 AM, Height: 74 in, Weight: 212 lbs, BMI: 27.2, Temperature: 97.6 F, Pulse: 50 bpm, Respiratory Rate:  18 breaths/min, Blood Pressure: 143/90 mmHg. Respiratory work of breathing is normal. Cardiovascular Pedal pulses palpable and strong bilaterally.. Edema is well controlled bilaterally. Psychiatric appears at normal baseline. General Notes: Wound exam; right posterior lower leg wound is closed on the left there is superficial wound that is just about 100% epithelialized laterally. The medial area from last week is healed Integumentary (Hair, Skin) Diffuse hemosiderin deposition in both lower extremities tightly fibrotic adherent skin. Wound #3 status is Open. Original cause of wound was Gradually Appeared. The wound is located on the Right,Posterior Calf. The wound measures 0cm length x 0cm width x 0cm depth; 0cm^2 area and 0cm^3 volume. There is no tunneling or undermining noted. There is a none present amount of drainage noted. The wound margin is flat and intact. There is no granulation within the wound bed. There is no necrotic tissue within the wound bed. Wound #9R status is Open. Original cause of wound was Not Known. The wound is located on the Left,Distal,Lateral Lower Leg. The wound measures 1.5cm length x 1cm width x 0.1cm depth; 1.178cm^2 area and 0.118cm^3 volume. There is Fat Layer (Subcutaneous Tissue) Exposed exposed. There is no tunneling or undermining noted. There is a small amount of serosanguineous drainage noted. The wound margin is flat and intact. There is large (67-100%) red granulation within the wound bed. There is no necrotic tissue within the wound bed. 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 There was a Four Layer Compression Therapy Procedure by [name], RN. Post procedure Diagnosis Wound #9R: Same as Pre-Procedure Plan Follow-up Appointments: Return Appointment in 1 week. Dressing Change Frequency: Do not change entire dressing for one week. Skin Barriers/Peri-Wound Care: TCA Cream or Ointment Wound Cleansing: May shower with protection. Primary Wound Dressing: Wound #9R Left,Distal,Lateral Lower Leg: Hydrofera Blue Secondary Dressing: Dry Gauze Edema Control: 4 layer compression: Left lower extremity Support Garment 20-30 mm/Hg pressure to: - circaid juxtalite to right , on in the am, off in the pm 1. Juxta light to the right leg we gave him instructions on this skin lubrication to the right leg at at bedtime 2. Continue Hydrofera Blue and 4-layer compression on the left hopefully healed by next week at which point he can use his bilateral juxta lites. 3. We went  over stocking application, skin lubrication, leg elevation and activity Electronic Signature(s) Signed: 07/25/2019 6:05:35 PM By: [name] MD Entered By: [name] on 07/25/2019 08:55:47 -------------------------------------------------------------------------------- SuperBill Details Patient Name: Date of Service: [name] 07/25/2019 Medical Record [id number] Patient Account Number: [id number] Date of Birth/Sex: Treating RN: 08-Nov-1940 (78 y.o. [name]) Dolores Lory, [place] Primary Care Provider: Hurshel Party Other Clinician: Referring Provider: Treating Provider/Extender:Robson, Cheryl Flash, [place] Weeks in Treatment: 21 Diagnosis Coding ICD-10 Codes Code Description E11.51 Type 2 diabetes mellitus with diabetic peripheral angiopathy without gangrene L97.211 Non-pressure chronic ulcer of right calf limited to breakdown of skin E11.42 Type 2 diabetes mellitus with diabetic polyneuropathy L97.221 Non-pressure chronic ulcer of  left calf limited to breakdown of skin I87.333 Chronic venous hypertension (idiopathic) with ulcer and inflammation of bilateral lower extremity Facility Procedures The patient participates with Medicare or their insurance follows the Medicare Facility Guidelines: CPT4 Code Description Modifier Quantity [id number] (Facility Use Only) [phone] - [place] [facility] Physician Procedures CPT4: Code [id number] Description: [id number] - WC PHYS LEVEL 3 - EST PT ICD-10 Diagnosis Description L97.221 Non-pressure chronic ulcer of left calf limited to break L97.211 Non-pressure chronic ulcer of right calf limited to brea I87.333 Chronic venous hypertension (idiopathic)  with ulcer and lower extremity Modifier: down of skin kdown of skin inflammation of Quantity: 1 bilateral Electronic Signature(s) Signed: 07/25/2019 6:05:35 PM By: [name] MD Entered By: [name] on 07/25/2019 08:56:08

## 2019-09-05 NOTE — Progress Notes (Signed)
[name] ([id number]) Visit Report for 07/18/2019 Arrival Information Details Patient Name: Date of Service: [name] 07/18/2019 8:00 AM Medical Record [name] Patient Account Number: [id number] Date of Birth/Sex: Treating RN: 09/06/1941 (78 y.o. [name]) [name] Primary Care Teila Skalsky: Hurshel Party Other Clinician: Referring Stryker Veasey: Treating Francis Yardley/Extender:Robson, Cheryl Flash, [place] Weeks in Treatment: 20 Visit Information History Since Last Visit All ordered tests and consults were completed: No Patient Arrived: Ambulatory Added or deleted any medications: No Arrival Time: 07:41 Any new allergies or adverse reactions: No Accompanied By: wife Had a fall or experienced change in No Transfer Assistance: None activities of daily living that may affect Patient Identification Verified: Yes risk of falls: Secondary Verification Process Yes Signs or symptoms of abuse/neglect since last No Completed: visito Patient Requires Transmission- No Hospitalized since last visit: No Based Precautions: Implantable device outside of the clinic excluding No Patient Has Alerts: Yes cellular tissue based products placed in the center Patient Alerts: Patient on Blood since last visit: Thinner Has Dressing in Place as Prescribed: Yes Has Compression in Place as Prescribed: Yes Pain Present Now: No Electronic Signature(s) Signed: 09/05/2019 3:01:15 PM By: [name] RN Entered By: [name] on 07/18/2019 07:54:22 -------------------------------------------------------------------------------- Compression Therapy Details Patient Name: Date of Service: Daniel, Gross 07/18/2019 8:00 AM Medical Record [id number] Patient Account Number: [id number] Date of Birth/Sex: Treating RN: 1940/10/23 (78 y.o. [name]) [name] Primary Care Lois Slagel: H[place]hel Party Other Clinician: Referring Yuleni Burich: Treating [name]/Extender:Robson, Cheryl Flash,  [place] Weeks in Treatment: 20 Compression Therapy Performed for Wound Wound #10 Left,Medial Lower Leg Assessment: Performed By: Clinician [name], RN Compression Type: Four Layer Post Procedure Diagnosis Same as Pre-procedure Electronic Signature(s) Signed: 09/05/2019 3:01:15 PM By: [name] RN Entered By: [name] on 07/18/2019 08:24:16 -------------------------------------------------------------------------------- Compression Therapy Details Patient Name: Date of Service: Daniel, Gross 07/18/2019 8:00 AM Medical Record [id number] Patient Account Number: [id number] Date of Birth/Sex: Treating RN: 1941/03/08 (78 y.o. [name]) [name] Primary Care Kerrie Latour: H[place] Party Other Clinician: Referring Talasia Saulter: Treating [name]/Extender:Robson, Cheryl Flash, [place] Weeks in Treatment: 20 Compression Therapy Performed for Wound Wound #3 Right,Posterior Calf Assessment: Performed By: Jake Church, RN Compression Type: Four Layer Post Procedure Diagnosis Same as Pre-procedure Electronic Signature(s) Signed: 09/05/2019 3:01:15 PM By: [name] RN Entered By: [name] on 07/18/2019 08:24:16 -------------------------------------------------------------------------------- Compression Therapy Details Patient Name: Date of Service: Daniel, Gross 07/18/2019 8:00 AM Medical Record [id number] Patient Account Number: [id number] Date of Birth/Sex: Treating RN: 15-Dec-1940 (78 y.o. [name]) [name] Primary Care Nikolis Berent: [place] Party Other Clinician: Referring Elvina Bosch: Treating [name]/Extender:Robson, Cheryl Flash, [place] Weeks in Treatment: 20 Compression Therapy Performed for Wound Wound #9R Left,Distal,Lateral Lower Leg Assessment: Performed By: Clinician [name], RN Compression Type: Four Layer Post Procedure Diagnosis Same as Pre-procedure Electronic Signature(s) Signed: 09/05/2019 3:01:15 PM By: [name]  RN Entered By: [name] on 07/18/2019 08:24:17 -------------------------------------------------------------------------------- Encounter Discharge Information Details Patient Name: Date of Service: Daniel Pia D. 07/18/2019 8:00 AM Medical Record [id number] Patient Account Number: [id number] Date of Birth/Sex: Treating RN: 02-Nov-1940 (78 y.o. [name] Primary Care Mustapha Colson: Hurshel Party Other Clinician: Referring Mylin Hirano: Treating [name]/Extender:Robson, Cheryl Flash, [place] Weeks in Treatment: 20 Encounter Discharge Information Items Discharge Condition: Stable Ambulatory Status: Ambulatory Discharge Destination: Home Transportation: Private Auto Accompanied By: wife Schedule Follow-up Appointment: Yes Clinical Summary of Care: Patient Declined Electronic Signature(s) Signed: 07/19/2019 6:50:57 PM By: Levan Hurst RN, BSN Entered By: Levan Hurst on 07/18/2019 12:09:08 -------------------------------------------------------------------------------- Lower Extremity Assessment Details Patient Name:  Date of Service: Daniel, Gross 07/18/2019 8:00 AM Medical Record [place] Patient Account Number: [id number] Date of Birth/Sex: Treating RN: Dec 05, 1940 (78 y.o. [name]) [name] Primary Care Minha Fulco: Hurshel Party Other Clinician: Referring Catharine Kettlewell: Treating Kreed Kauffman/Extender:Robson, Cheryl Flash, [place] Weeks in Treatment: 20 Edema Assessment Assessed: [Left: No] [Right: No] Edema: [Left: No] [Right: No] Calf Left: Right: Point of Measurement: 31 cm From Medial Instep 31 cm 33.5 cm Ankle Left: Right: Point of Measurement: 11 cm From Medial Instep 21 cm 21.4 cm Electronic Signature(s) Signed: 09/05/2019 3:01:15 PM By: [name] RN Entered By: [name] on 07/18/2019 07:55:05 -------------------------------------------------------------------------------- Multi Wound Chart Details Patient Name: Date of  Service: Daniel Pia D. 07/18/2019 8:00 AM Medical Record [id number] Patient Account Number: [id number] Date of Birth/Sex: Treating RN: 1941/06/17 (78 y.o. [name]) [name] Primary Care Raizel Wesolowski: Hurshel Party Other Clinician: Referring Tarena Gockley: Treating Rolondo Pierre/Extender:Robson, Cheryl Flash, [place] Weeks in Treatment: 20 Vital Signs Height(in): 74 Pulse(bpm): 74 Weight(lbs): 212 Blood Pressure(mmHg): 138/82 Body Mass Index(BMI): 27 Temperature(F): 97.9 Respiratory 18 Rate(breaths/min): Photos: [10:No Photos] [3:No Photos] [9R:No Photos] Wound Location: [10:Left Lower Leg - Medial] [3:Right Calf - Posterior] [9R:Left Lower Leg - Lateral, Distal] Wounding Event: [10:Gradually Appeared] [3:Gradually Appeared] [9R:Not Known] Primary Etiology: [10:Venous Leg Ulcer] [3:Diabetic Wound/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] Comorbid History: [10:Cataracts, Hypertension, Cataracts, Hypertension, Cataracts, Hypertension, Peripheral Venous Disease, Peripheral Venous Disease, Peripheral Venous Disease, Type II Diabetes, Gout, Received Radiation] [3:Type II Diabetes, Gout,  Received Radiation] [9R:Type II Diabetes, Gout, Received Radiation] Date Acquired: [10:05/17/2019] [3:02/28/2019] [9R:05/17/2019] Weeks of Treatment: [10:8] [3:20] [9R:8] Wound Status: [10:Open] [3:Open] [9R:Open] Wound Recurrence: [10:No] [3:No] [9R:Yes] Clustered Wound: [10:Yes] [3:No] [9R:No] Clustered Quantity: [10:3] [3:N/A] [9R:N/A] Measurements L x W x D 0x0x0 [3:0.3x0.2x0.1] [9R:1.7x1.8x0.1] (cm) Area (cm) : [10:0] [3:0.047] [9R:2.403] Volume (cm) : [10:0] [3:0.005] [9R:0.24] % Reduction in Area: [10:100.00%] [3:99.80%] [9R:72.70%] % Reduction in Volume: 100.00% [3:99.80%] [9R:72.70%] Classification: [10:Full Thickness Without Exposed Support Structures] [3:Grade 2] [9R:Full Thickness Without Exposed Support Structures] Exudate Amount: [10:None Present] [3:Small] [9R:Medium] Exudate Type:  [10:N/A] [3:Serosanguineous] [9R:Serosanguineous] Exudate Color: [10:N/A] [3:red, brown] [9R:red, brown] Wound Margin: [10:Distinct, outline attached] [3:Flat and Intact] [9R:Flat and Intact] Granulation Amount: [10:None Present (0%)] [3:Large (67-100%)] [9R:Large (67-100%)] Granulation Quality: [10:N/A] [3:Red] [9R:Red] Necrotic Amount: [10:None Present (0%)] [3:Small (1-33%)] [9R:None Present (0%)] Exposed Structures: [10:Fascia: No Fat Layer (Subcutaneous Tissue) Exposed: No Tendon: No Muscle: No Joint: No Bone: No] [3:Fat Layer (Subcutaneous Tissue) Exposed: Yes Fascia: No Tendon: No Muscle: No Joint: No Bone: No] [9R:Fat Layer (Subcutaneous Tissue) Exposed: Yes  Fascia: No Tendon: No Muscle: No Joint: No Bone: No] Epithelialization: [10:Large (67-100%) Compression Therapy] [3:Large (67-100%) Compression Therapy] [9R:None Compression Therapy] Treatment Notes Electronic Signature(s) Signed: 07/18/2019 6:17:56 PM By: [name] MD Signed: 09/05/2019 3:01:15 PM By: [name] RN Entered By: [name] on 07/18/2019 08:55:19 -------------------------------------------------------------------------------- Multi-Disciplinary Care Plan Details Patient Name: Date of Service: Daniel Pia D. 07/18/2019 8:00 AM Medical Record [id number] Patient Account Number: [id number] Date of Birth/Sex: Treating RN: 12/18/1940 (78 y.o. [name] Primary Care Otniel Hoe: Hurshel Party Other Clinician: Referring Myha Arizpe: Treating Zaden Sako/Extender:Robson, Cheryl Flash, [place] Weeks in Treatment: 20 Active Inactive Wound/Skin Impairment Nursing Diagnoses: Knowledge deficit related to ulceration/compromised skin integrity Goals: Patient/caregiver will verbalize understanding of skin care regimen Date Initiated: 02/28/2019 Target Resolution Date: 08/04/2019 Goal Status: Active Ulcer/skin breakdown will have a volume reduction of 30% by week 4 Date Initiated: 02/28/2019 Date  Inactivated: 04/04/2019 Target Resolution Date: 03/31/2019 Goal Status: Met Ulcer/skin breakdown will have a  volume reduction of 50% by week 8 Date Initiated: 04/04/2019 Date Inactivated: 05/09/2019 Target Resolution Date: 05/05/2019 Goal Status: Met Ulcer/skin breakdown will have a volume reduction of 80% by week 12 Date Initiated: 05/09/2019 Date Inactivated: 06/13/2019 Target Resolution Date: 06/09/2019 Unmet Goal Status: Unmet Reason: comorbities/new wounds Ulcer/skin breakdown will heal within 14 weeks Date Initiated: 06/13/2019 Date Inactivated: 07/11/2019 Target Resolution Date: 07/07/2019 Unmet Goal Status: Unmet Reason: comorbityies Interventions: Assess patient/caregiver ability to obtain necessary supplies Assess patient/caregiver ability to perform ulcer/skin care regimen upon admission and as needed Assess ulceration(s) every visit Notes: Electronic Signature(s) Signed: 09/05/2019 3:01:15 PM By: [name] RN Entered By: [name] on 07/18/2019 08:10:42 -------------------------------------------------------------------------------- Pain Assessment Details Patient Name: Date of Service: JAMEY, HARMAN 07/18/2019 8:00 AM Medical Record [id number] Patient Account Number: [id number] Date of Birth/Sex: Treating RN: 03-24-1941 (78 y.o. [name]) [name] Primary Care Trish Mancinelli: Hurshel Party Other Clinician: Referring Kieli Golladay: Treating Seaver Machia/Extender:Robson, Cheryl Flash, [place] Weeks in Treatment: 20 Active Problems Location of Pain Severity and Description of Pain Patient Has Paino No Site Locations Pain Management and Medication Current Pain Management: Electronic Signature(s) Signed: 09/05/2019 3:01:15 PM By: [name] RN Entered By: [name] on 07/18/2019 07:55:01 -------------------------------------------------------------------------------- Patient/Caregiver Education Details Patient Name: Date of Service: [name]  10/20/2020andnbsp8:00 AM Medical Record [phone] Patient Account Number: [id number] Date of Birth/Gender: Treating RN: Nov 06, 1940 (78 y.o. [name]) [name] Primary Care Physician: Hurshel Party Other Clinician: Referring Physician: Treating Physician/Extender:Robson, Cheryl Flash, [place] Weeks in Treatment: 20 Education Assessment Education Provided To: Patient Education Topics Provided Wound/Skin Impairment: Methods: Explain/Verbal Responses: State content correctly Electronic Signature(s) Signed: 09/05/2019 3:01:15 PM By: [name] RN Entered By: [name] on 07/18/2019 08:10:57 -------------------------------------------------------------------------------- Wound Assessment Details Patient Name: Date of Service: Daniel, Gross 07/18/2019 8:00 AM Medical Record [id number] Patient Account Number: [id number] Date of Birth/Sex: Treating RN: 1941/05/06 (78 y.o. [name]) [name] Primary Care Fairley Copher: Hurshel Party Other Clinician: Referring Adriell Polansky: Treating Lillianna Sabel/Extender:Robson, Cheryl Flash, [place] Weeks in Treatment: 20 Wound Status Wound Number: 10 Primary Venous Leg Ulcer Etiology: Wound Location: Left Lower Leg - Medial Wound Healed - Epithelialized Wounding Event: Gradually Appeared Status: Date Acquired: 05/17/2019 Comorbid Cataracts, Hypertension, Peripheral Venous Weeks Of Treatment: 8 History: Disease, Type II Diabetes, Gout, Received Clustered Wound: Yes Radiation Photos Wound Measurements Length: (cm) 0 % Reduct Width: (cm) 0 % Reduct Depth: (cm) 0 Epitheli Clustered Quantity: 3 Tunnelin Area: (cm) 0 Undermi Volume: (cm) 0 Wound Description Full Thickness Without Exposed Support Foul Od Classification: Structures Slough/ Wound Distinct, outline attached Margin: Exudate None Present Amount: Wound Bed Granulation Amount: None Present (0%) Necrotic Amount: None Present (0%) Fascia Fat Lay Tendon Muscle Joint  E Bone Ex or After Cleansing: No Fibrino No Exposed Structure Exposed: No er (Subcutaneous Tissue) Exposed: No Exposed: No Exposed: No xposed: No posed: No ion in Area: 100% ion in Volume: 100% alization: Large (67-100%) g: No ning: No Electronic Signature(s) Signed: 07/18/2019 4:20:24 PM By: Mikeal Hawthorne EMT/HBOT Signed: 09/05/2019 3:01:15 PM By: [name] RN Entered By: Mikeal Hawthorne on 07/18/2019 15:39:33 -------------------------------------------------------------------------------- Wound Assessment Details Patient Name: Date of Service: Daniel Pia D. 07/18/2019 8:00 AM Medical Record [id number] Patient Account Number: [id number] Date of Birth/Sex: Treating RN: [birth year] (78 y.o. [name]) [name] Primary Care Xaviera Flaten: Hurshel Party Other Clinician: Referring Sundai Probert: Treating Ladawn Boullion/Extender:Robson, Cheryl Flash, [place] Weeks in Treatment: 20 Wound Status Wound Number: 3 Primary Diabetic Wound/Ulcer of the Lower Extremity Etiology: Wound Location: Right Calf - Posterior Wound Open Wounding Event: Gradually  Appeared Status: Date Acquired: 02/28/2019 Comorbid Cataracts, Hypertension, Peripheral Venous Weeks Of Treatment: 20 History: Disease, Type II Diabetes, Gout, Received Clustered Wound: No Radiation Photos Wound Measurements Length: (cm) 0.3 Width: (cm) 0.2 Depth: (cm) 0.1 Area: (cm) 0.047 Volume: (cm) 0.005 Wound Description Classification: Grade 2 Wound Margin: Flat and Intact Exudate Amount: Small Exudate Type: Serosanguineous Exudate Color: red, brown Wound Bed Granulation Amount: Large (67-100%) Granulation Quality: Red Necrotic Amount: Small (1-33%) Necrotic Quality: Adherent Slough After Cleansing: No brino No Exposed Structure osed: No (Subcutaneous Tissue) Exposed: Yes osed: No osed: No sed: No ed: No % Reduction in Area: 99.8% % Reduction in Volume: 99.8% Epithelialization: Large (67-100%) Tunneling:  No Undermining: No Foul Odor Slough/Fi Fascia Exp Fat Layer Tendon Exp Muscle Exp Joint Expo Bone Expos Electronic Signature(s) Signed: 07/18/2019 4:20:24 PM By: Mikeal Hawthorne EMT/HBOT Signed: 09/05/2019 3:01:15 PM By: [name] RN Entered By: Mikeal Hawthorne on 07/18/2019 15:28:07 -------------------------------------------------------------------------------- Wound Assessment Details Patient Name: Date of Service: Daniel Pia D. 07/18/2019 8:00 AM Medical Record [id number] Patient Account Number: [id number] Date of Birth/Sex: Treating RN: 30-Aug-1941 (78 y.o. [name]) [name] Primary Care Laurin Paulo: H[place]hel Party Other Clinician: Referring Kiana Hollar: Treating Roderick Sweezy/Extender:Robson, Cheryl Flash, [place] Weeks in Treatment: 20 Wound Status Wound Number: 9R Primary Venous Leg Ulcer Etiology: Wound Location: Left Lower Leg - Lateral, Distal Wound Open Wounding Event: Not Known Status: Date Acquired: 05/17/2019 Comorbid Cataracts, Hypertension, Peripheral Venous Weeks Of Treatment: 8 History: Disease, Type II Diabetes, Gout, Received Clustered Wound: No Radiation Photos Wound Measurements Length: (cm) 1.7 % Reduct Width: (cm) 1.8 % Reduct Depth: (cm) 0.1 Epitheli Area: (cm) 2.403 Tunneli Volume: (cm) 0.24 Undermi Wound Description Full Thickness Without Exposed Support Classification: Structures Wound Flat and Intact Margin: Exudate Medium Amount: Exudate Serosanguineous Type: Exudate red, brown Color: Wound Bed Granulation Amount: Large (67-100%) Granulation Quality: Red Necrotic Amount: None Present (0%) Foul Odor After Cleansing: No Slough/Fibrino No Exposed Structure Fascia Exposed: No Fat Layer (Subcutaneous Tissue) Exposed: Yes Tendon Exposed: No Muscle Exposed: No Joint Exposed: No Bone Exposed: No ion in Area: 72.7% ion in Volume: 72.7% alization: None ng: No ning: No Electronic Signature(s) Signed: 07/18/2019 4:20:24  PM By: Mikeal Hawthorne EMT/HBOT Signed: 09/05/2019 3:01:15 PM By: [name] RN Entered By: Mikeal Hawthorne on 07/18/2019 15:28:29 -------------------------------------------------------------------------------- Vitals Details Patient Name: Date of Service: Daniel Pia D. 07/18/2019 8:00 AM Medical Record [id number] Patient Account Number: [id number] Date of Birth/Sex: Treating RN: 1941-02-24 (78 y.o. [name]) Dolores Lory, [place] Primary Care Janeane Cozart: Hurshel Party Other Clinician: Referring Katheen Aslin: Treating Haydan Mansouri/Extender:Robson, Cheryl Flash, [place] Weeks in Treatment: 20 Vital Signs Time Taken: 07:54 Temperature (F): 97.9 Height (in): 74 Pulse (bpm): 74 Weight (lbs): 212 Respiratory Rate (breaths/min): 18 Body Mass Index (BMI): 27.2 Blood Pressure (mmHg): 138/82 Reference Range: 80 - 120 mg / dl Electronic Signature(s) Signed: 09/05/2019 3:01:15 PM By: [name] RN Entered By: [name] on 07/18/2019 07:54:55

## 2019-09-05 NOTE — Progress Notes (Signed)
[name], [name] ([id number]) Visit Report for 07/11/2019 Debridement Details Patient Name: Date of Service: [name], [name] 07/11/2019 8:00 AM Medical Record [place] Patient Account Number: [id number] Date of Birth/Sex: [birth year] (78 y.o. M) Treating RN: [name] Primary Care Provider: Hurshel Party Other Clinician: Referring Provider: Treating Provider/Extender:Robson, Cheryl Flash, [place] Weeks in Treatment: 19 Debridement Performed for Wound #3 Right,Posterior Calf Assessment: Performed By: Physician Ricard Dillon., MD Debridement Type: Debridement Severity of Tissue Pre Limited to breakdown of skin Debridement: Level of Consciousness (Pre- Awake and Alert procedure): Pre-procedure Verification/Time Out Taken: Yes - 08:48 Start Time: 08:48 Pain Control: Lidocaine 5% topical ointment Total Area Debrided (L x W): 0.8 (cm) x 0.4 (cm) = 0.32 (cm) Tissue and other material Non-Viable, Skin: Dermis , Skin: Epidermis debrided: Level: Skin/Epidermis Debridement Description: Selective/Open Wound Instrument: Curette Bleeding: Minimum Hemostasis Achieved: Pressure End Time: 08:49 Procedural Pain: 0 Post Procedural Pain: 0 Response to Treatment: Procedure was tolerated well Level of Consciousness Awake and Alert (Post-procedure): Post Debridement Measurements of Total Wound Length: (cm) 0.8 Width: (cm) 0.4 Depth: (cm) 0.1 Volume: (cm) 0.025 Character of Wound/Ulcer Post Improved Debridement: Severity of Tissue Post Debridement: Limited to breakdown of skin Post Procedure Diagnosis Same as Pre-procedure Electronic Signature(s) Signed: 07/11/2019 5:42:33 PM By: [name] MD Signed: 09/05/2019 3:01:15 PM By: [name] RN Entered By: [name] on 07/11/2019 08:58:16 -------------------------------------------------------------------------------- HPI Details Patient Name: Date of Service: Daniel Pia D. 07/11/2019 8:00 AM Medical  Record [id number] Patient Account Number: [id number] Date of Birth/Sex: Treating RN: May 30, 1941 (78 y.o. [name] Primary Care Provider: Hurshel Party Other Clinician: Referring Provider: Treating Provider/Extender:Robson, Cheryl Flash, [place] Weeks in Treatment: 19 History of Present Illness Location: Patient presents with a wound to left lower leg. Quality: Patient reports No Pain. Duration: 2 months HPI Description: no cig or alcohol. spontaneous appearance in area of stasis dermamtitis. [name] on metformin only. chronic afib on Coumadin. diabetes and coag studies not good. hba1c 7.5. ivr 4.5. no pain or sxs of systemic disease. hx chf. no intermittent claudication 02/28/2019 Readmission This is a now a 78 year old man who was previously cared for in 2016 by Dr. Lindon Romp for wounds on his lower extremities. At that point he had venous reflux studies although I cannot seem to open these in [place] link. He had arterial studies showing an ABI of 1.11 on the right and 1.27 on the left his waveforms were triphasic bilaterally. He was discharged in stockings although I do not believe he is wearing these in some time. He tells me that about a month ago he noted openings of a large wound on the posterior right calf and 2 smaller areas on the left lateral calf and a small area more recently on the left posterior calf. He has been dressing these with peroxide and triple antibiotic ointment. He is not wearing compression. Past medical history; type 2 diabetes with peripheral neuropathy, chronic venous insufficiency, hypertension, cardiomyopathy, chronic atrial fibrillation on Coumadin, prostate cancer, hyperlipidemia, gout, ABI in our clinic was 1.34 on the left and not obtainable on the right 6/9; this is a patient who has chronic venous insufficiency. He has a fairly substantial area on the right posterior calf, left lateral calf and a small area on the left posterior calf. On  arrival last week he had very palpable popliteal and femoral pulses but nothing in his bilateral feet. Unfortunately we cannot get arterial studies until July 1 at Dr. Kennon Holter office. They live in [place]. We use  silver alginate under Kerlix Coban 6/16; patient with chronic venous insufficiency with wounds on his bilateral lower extremities. When he came into our clinic he was discovered to have a complete absence of peripheral pedal pulses at either the dorsalis pedis or posterior tibial. He does have easily palpable femoral and popliteal pulses. He sees Dr. [name] tomorrow for noninvasive arterial tests. He may also require venous reflux evaluation although I do not view this as an urgent thing. We have been using silver alginate. His wound surfaces of cleaned up quite nicely 6/23; patient with chronic venous insufficiency with wounds on his bilateral lower extremities. His wounds all are somewhat better looking. He did go to Dr. Kennon Holter office but somehow ended up on the doctors schedule rather than being scheduled for noninvasive tests therefore his noninvasive tests are scheduled for July 1. We agree that he has venous insufficiency ulcers but I cannot feel any pulses in his lower extremities dictating the need for test. We are only using Kerlix and light Coban unfortunately this appears to be holding the edema 6/30; has his arterial studies tomorrow. We have been using Kerlix and light Coban will go to a more aggressive compression if the arterial studies will allow. We all agreed these are venous wounds however I cannot feel pulses at either the dorsalis pedis or posterior tibial bilaterally. His wounds generally look some better including left lateral and right posterior. 7/7-Patient returns at 1 week in Kerlix/Coban to both legs, with improvement, in the left lateral and right posterior lower leg wounds, ABI's are normal in both legs per vascular studies, TBI is also normal on both  sides, we are using hydrofera blue to the wounds 7/14; patient's arterial studies from 2 weeks ago showed an ABI on the right at 1.03 with a TBI of 0.86. On the left the ABI was 1.06 with a TBI of 0.84. Notable for the fact that his arterial waveforms were monophasic in all of the lower extremity arteries suggesting some degree of arterial occlusive disease but in general this was felt to be fairly adequate for healing. His compression was increased from 2-3 layer which is appropriate. Dressing was changed to [facility] 7/21; patient's wounds are measuring smaller. The more substantial one on the right posterior calf, second 1 on the left lateral calf. Using [facility] on both wound areas 7/28; patient continues to make nice improvements. The area on the right posterior calf is smaller. Area on the left lateral calf also is smaller. We have been using Hydrofera Blue under compression. The patient will need compression stockings and we have measured him for these in the eventuality that these heal which really should not be too long from now 8/4-Patient continues to make improvement, the right posterior calf area smaller with rim of keratotic skin on one side, the left wound is definitely smaller and improving. 8/11-Returns at 1 week, after being in 3 layer compression on both legs, both wounds appear to be improving, making good progress, patient is happy, pain is also less especially in the right leg wound 8/18; the area on the left anterior lower leg is healed. On the right posterior leg the wound remains although the dimensions are a lot better. 8/25; he arrives in clinic today with a large body of open wound on the left lateral calf. All of the 3 wounds in this area are in close juxtaposition to each other. The story is that we discharged him last week with no a wrap on  the left leg. They went to [place] could not get in as they are only excepting phone orders or online orders  for stockings hence they did not put any stocking on the left leg all week. They have something at home but the patient with that was either incapable or just did not put them on. Apparently these opened 1 morning after getting out of bed. The area on the right has no real change 9/1; patient has bilateral lower extremity wounds in the setting of severe chronic venous insufficiency and secondary lymphedema. He arrived last week with new areas on the left lateral lower leg after we did not wrap him and he did not use his stockings. Nevertheless the areas on the left look better today under compression. Posterior right calf does not really changed. We are using Hydrofera Blue on both areas under compression 9/15; bilateral lower extremity wounds in the setting of severe chronic venous insufficiency and secondary lymphedema. He has 20 to 30 mmHg below-knee compression stockings under the eventuality that these close over. We did get the left leg to close but he did not transition to a stocking and this reopened. There are 2 open areas on the left posterior lateral calf and one on the right. Both of these look satisfactory. Using [facility] 9/22; bilateral lower extremity wounds in the setting of chronic venous insufficiency. 2 superficial areas on the left lateral calf. One on the right just above the Achilles area. We have good edema control we have been using Hydrofera Blue 9/29; the areas on the left lateral calf are healed. On the right just above the Achilles and tendon area things look a lot better small wounds one scabbed area. We have been using Hydrofera Blue. We can discharge him in his own stocking on the left still wrapping on the right. This is the second time we have healed the left leg but he did not put a stocking on last time. Hopefully this will maintain the edema from chronic venous disease with secondary lymphedema 10/6; he comes in today having a stocking on the left leg.  They had trouble getting it on there is a lot of increase in swelling 2 small open areas one anteriorly and one on the medial calf. They report a lot of difficulty getting the stocking on. Paradoxically the area on the right that we have been wrapping posteriorly is closed 10/13; he comes in today with wounds bilaterally including superficial areas on the left medial and left lateral calf. As well as the right posterior has reopened in the Achilles area superiorly. He still does not have his juxta lite stockings although truthfully we would not of been able to use them today anyway. Apparently have been ordered and paid for from [employer] although they have not been delivered Electronic Signature(s) Signed: 07/11/2019 5:42:33 PM By: [name] MD Entered By: [name] on 07/11/2019 09:09:19 -------------------------------------------------------------------------------- Physical Exam Details Patient Name: Date of Service: [name], [name] 07/11/2019 8:00 AM Medical Record [id number] Patient Account Number: [id number] Date of Birth/Sex: Treating RN: 1941-01-13 (78 y.o. [name]) [name] Primary Care Provider: Hurshel Party Other Clinician: Referring Provider: Treating Provider/Extender:Robson, Cheryl Flash, [place] Weeks in Treatment: 19 Constitutional Sitting or standing Blood Pressure is within target range for patient.. Pulse regular and within target range for patient.Marland Kitchen Respirations regular, non-labored and within target range.. Temperature is normal and within the target range for the patient.Marland Kitchen Appears in no distress. Eyes Conjunctivae clear. No discharge.no icterus. Cardiovascular Pedal pulses  palpable bilaterally. Integumentary (Hair, Skin) Changes of chronic venous insufficiency. Psychiatric appears at normal baseline. Notes Wound exam; right posterior lower leg has reopened. Debrided of thick callus and denuded skin from around the circumference. On the  left leg both medially and laterally are superficial wounds the area on the left lateral still has some size. No evidence of infection Electronic Signature(s) Signed: 07/11/2019 5:42:33 PM By: [name] MD Entered By: [name] on 07/11/2019 09:10:32 -------------------------------------------------------------------------------- Physician Orders Details Patient Name: Date of Service: Daniel Pia D. 07/11/2019 8:00 AM Medical Record [id number] Patient Account Number: [id number] Date of Birth/Sex: Treating RN: 03-14-1941 (78 y.o. [name]) [name] Primary Care Provider: Hurshel Party Other Clinician: Referring Provider: Treating Provider/Extender:Robson, Cheryl Flash, [place] Weeks in Treatment: 53 Verbal / Phone Orders: No Diagnosis Coding ICD-10 Coding Code Description E11.51 Type 2 diabetes mellitus with diabetic peripheral angiopathy without gangrene L97.211 Non-pressure chronic ulcer of right calf limited to breakdown of skin E11.42 Type 2 diabetes mellitus with diabetic polyneuropathy L97.221 Non-pressure chronic ulcer of left calf limited to breakdown of skin I87.333 Chronic venous hypertension (idiopathic) with ulcer and inflammation of bilateral lower extremity Follow-up Appointments Return Appointment in 1 week. Dressing Change Frequency Do not change entire dressing for one week. Skin Barriers/Peri-Wound Care TCA Cream or Ointment Wound Cleansing May shower with protection. Primary Wound Dressing Wound #10 Left,Medial Lower Leg Hydrofera Blue Wound #3 Right,Posterior Calf Hydrofera Blue Secondary Dressing Wound #10 Left,Medial Lower Leg Dry Gauze ABD pad Wound #3 Right,Posterior Calf Dry Gauze ABD pad Edema Control Wound #10 Left,Medial Lower Leg 4 layer compression - Bilateral Other: - ketoconazole to right and left ankle area, patient to obtain lotrimin and apply to right ankle daily Wound #3 Right,Posterior Calf 4 layer  compression - Bilateral Electronic Signature(s) Signed: 07/11/2019 5:42:33 PM By: [name] MD Signed: 09/05/2019 3:01:15 PM By: [name] RN Entered By: [name] on 07/11/2019 08:50:51 -------------------------------------------------------------------------------- Problem List Details Patient Name: Date of Service: Daniel Pia D. 07/11/2019 8:00 AM Medical Record [id number] Patient Account Number: [id number] Date of Birth/Sex: Treating RN: 07-10-1941 (78 y.o. [name]) Dolores Lory, [place] Primary Care Provider: Hurshel Party Other Clinician: Referring Provider: Treating Provider/Extender:Robson, Cheryl Flash, [place] Weeks in Treatment: 19 Active Problems ICD-10 Evaluated Encounter Code Description Active Date Today Diagnosis E11.51 Type 2 diabetes mellitus with diabetic peripheral 02/28/2019 No Yes angiopathy without gangrene L97.211 Non-pressure chronic ulcer of right calf limited to 02/28/2019 No Yes breakdown of skin E11.42 Type 2 diabetes mellitus with diabetic polyneuropathy 02/28/2019 No Yes L97.221 Non-pressure chronic ulcer of left calf limited to 02/28/2019 No Yes breakdown of skin I87.333 Chronic venous hypertension (idiopathic) with ulcer 02/28/2019 No Yes and inflammation of bilateral lower extremity Inactive Problems Resolved Problems Electronic Signature(s) Signed: 07/11/2019 5:42:33 PM By: [name] MD Entered By: [name] on 07/11/2019 09:08:01 -------------------------------------------------------------------------------- Progress Note Details Patient Name: Date of Service: Daniel Pia D. 07/11/2019 8:00 AM Medical Record [id number] Patient Account Number: [id number] Date of Birth/Sex: Treating RN: September 27, 1941 (78 y.o. [name]) [name] Primary Care Provider: Hurshel Party Other Clinician: Referring Provider: Treating Provider/Extender:Robson, Cheryl Flash, [place] Weeks in Treatment: 19 Subjective History of Present Illness  (HPI) The following HPI elements were documented for the patient's wound: Location: Patient presents with a wound to left lower leg. Quality: Patient reports No Pain. Duration: 2 months no cig or alcohol. spontaneous appearance in area of stasis dermamtitis. [name] on metformin only. chronic afib on Coumadin. diabetes and coag studies not good. hba1c 7.5. ivr 4.5. no pain or  sxs of systemic disease. hx chf. no intermittent claudication 02/28/2019 Readmission This is a now a 78 year old man who was previously cared for in 2016 by Dr. Lindon Romp for wounds on his lower extremities. At that point he had venous reflux studies although I cannot seem to open these in [place] link. He had arterial studies showing an ABI of 1.11 on the right and 1.27 on the left his waveforms were triphasic bilaterally. He was discharged in stockings although I do not believe he is wearing these in some time. He tells me that about a month ago he noted openings of a large wound on the posterior right calf and 2 smaller areas on the left lateral calf and a small area more recently on the left posterior calf. He has been dressing these with peroxide and triple antibiotic ointment. He is not wearing compression. Past medical history; type 2 diabetes with peripheral neuropathy, chronic venous insufficiency, hypertension, cardiomyopathy, chronic atrial fibrillation on Coumadin, prostate cancer, hyperlipidemia, gout, ABI in our clinic was 1.34 on the left and not obtainable on the right 6/9; this is a patient who has chronic venous insufficiency. He has a fairly substantial area on the right posterior calf, left lateral calf and a small area on the left posterior calf. On arrival last week he had very palpable popliteal and femoral pulses but nothing in his bilateral feet. Unfortunately we cannot get arterial studies until July 1 at Dr. Kennon Holter office. They live in [place]. We use silver alginate under Kerlix Coban 6/16;  patient with chronic venous insufficiency with wounds on his bilateral lower extremities. When he came into our clinic he was discovered to have a complete absence of peripheral pedal pulses at either the dorsalis pedis or posterior tibial. He does have easily palpable femoral and popliteal pulses. He sees Dr. [name] tomorrow for noninvasive arterial tests. He may also require venous reflux evaluation although I do not view this as an urgent thing. We have been using silver alginate. His wound surfaces of cleaned up quite nicely 6/23; patient with chronic venous insufficiency with wounds on his bilateral lower extremities. His wounds all are somewhat better looking. He did go to Dr. Kennon Holter office but somehow ended up on the doctors schedule rather than being scheduled for noninvasive tests therefore his noninvasive tests are scheduled for July 1. We agree that he has venous insufficiency ulcers but I cannot feel any pulses in his lower extremities dictating the need for test. We are only using Kerlix and light Coban unfortunately this appears to be holding the edema 6/30; has his arterial studies tomorrow. We have been using Kerlix and light Coban will go to a more aggressive compression if the arterial studies will allow. We all agreed these are venous wounds however I cannot feel pulses at either the dorsalis pedis or posterior tibial bilaterally. His wounds generally look some better including left lateral and right posterior. 7/7-Patient returns at 1 week in Kerlix/Coban to both legs, with improvement, in the left lateral and right posterior lower leg wounds, ABI's are normal in both legs per vascular studies, TBI is also normal on both sides, we are using hydrofera blue to the wounds 7/14; patient's arterial studies from 2 weeks ago showed an ABI on the right at 1.03 with a TBI of 0.86. On the left the ABI was 1.06 with a TBI of 0.84. Notable for the fact that his arterial waveforms were  monophasic in all of the lower extremity arteries suggesting some degree  of arterial occlusive disease but in general this was felt to be fairly adequate for healing. His compression was increased from 2-3 layer which is appropriate. Dressing was changed to [facility] 7/21; patient's wounds are measuring smaller. The more substantial one on the right posterior calf, second 1 on the left lateral calf. Using [facility] on both wound areas 7/28; patient continues to make nice improvements. The area on the right posterior calf is smaller. Area on the left lateral calf also is smaller. We have been using Hydrofera Blue under compression. The patient will need compression stockings and we have measured him for these in the eventuality that these heal which really should not be too long from now 8/4-Patient continues to make improvement, the right posterior calf area smaller with rim of keratotic skin on one side, the left wound is definitely smaller and improving. 8/11-Returns at 1 week, after being in 3 layer compression on both legs, both wounds appear to be improving, making good progress, patient is happy, pain is also less especially in the right leg wound 8/18; the area on the left anterior lower leg is healed. On the right posterior leg the wound remains although the dimensions are a lot better. 8/25; he arrives in clinic today with a large body of open wound on the left lateral calf. All of the 3 wounds in this area are in close juxtaposition to each other. The story is that we discharged him last week with no a wrap on the left leg. They went to [place] could not get in as they are only excepting phone orders or online orders for stockings hence they did not put any stocking on the left leg all week. They have something at home but the patient with that was either incapable or just did not put them on. Apparently these opened 1 morning after getting out of bed. The area on the right  has no real change 9/1; patient has bilateral lower extremity wounds in the setting of severe chronic venous insufficiency and secondary lymphedema. He arrived last week with new areas on the left lateral lower leg after we did not wrap him and he did not use his stockings. Nevertheless the areas on the left look better today under compression. Posterior right calf does not really changed. We are using Hydrofera Blue on both areas under compression 9/15; bilateral lower extremity wounds in the setting of severe chronic venous insufficiency and secondary lymphedema. He has 20 to 30 mmHg below-knee compression stockings under the eventuality that these close over. We did get the left leg to close but he did not transition to a stocking and this reopened. There are 2 open areas on the left posterior lateral calf and one on the right. Both of these look satisfactory. Using [facility] 9/22; bilateral lower extremity wounds in the setting of chronic venous insufficiency. 2 superficial areas on the left lateral calf. One on the right just above the Achilles area. We have good edema control we have been using Hydrofera Blue 9/29; the areas on the left lateral calf are healed. On the right just above the Achilles and tendon area things look a lot better small wounds one scabbed area. We have been using Hydrofera Blue. We can discharge him in his own stocking on the left still wrapping on the right. This is the second time we have healed the left leg but he did not put a stocking on last time. Hopefully this will maintain the edema from  chronic venous disease with secondary lymphedema 10/6; he comes in today having a stocking on the left leg. They had trouble getting it on there is a lot of increase in swelling 2 small open areas one anteriorly and one on the medial calf. They report a lot of difficulty getting the stocking on. ooParadoxically the area on the right that we have been wrapping  posteriorly is closed 10/13; he comes in today with wounds bilaterally including superficial areas on the left medial and left lateral calf. As well as the right posterior has reopened in the Achilles area superiorly. He still does not have his juxta lite stockings although truthfully we would not of been able to use them today anyway. Apparently have been ordered and paid for from [employer] although they have not been delivered Objective Constitutional Sitting or standing Blood Pressure is within target range for patient.. Pulse regular and within target range for patient.Marland Kitchen Respirations regular, non-labored and within target range.. Temperature is normal and within the target range for the patient.Marland Kitchen Appears in no distress. Vitals Time Taken: 7:57 AM, Height: 74 in, Weight: 212 lbs, BMI: 27.2, Temperature: 97.8 F, Pulse: 85 bpm, Respiratory Rate: 18 breaths/min, Blood Pressure: 115/63 mmHg. Eyes Conjunctivae clear. No discharge.no icterus. Cardiovascular Pedal pulses palpable bilaterally. Psychiatric appears at normal baseline. General Notes: Wound exam; right posterior lower leg has reopened. Debrided of thick callus and denuded skin from around the circumference. ooOn the left leg both medially and laterally are superficial wounds the area on the left lateral still has some size. ooNo evidence of infection Integumentary (Hair, Skin) Changes of chronic venous insufficiency. Wound #10 status is Open. Original cause of wound was Gradually Appeared. The wound is located on the Left,Medial Lower Leg. The wound measures 2.2cm length x 0.8cm width x 0.1cm depth; 1.382cm^2 area and 0.138cm^3 volume. There is Fat Layer (Subcutaneous Tissue) Exposed exposed. There is no tunneling or undermining noted. There is a medium amount of serosanguineous drainage noted. The wound margin is distinct with the outline attached to the wound base. There is large (67-100%) pink, pale granulation within the wound  bed. There is no necrotic tissue within the wound bed. Wound #11 status is Open. Original cause of wound was Gradually Appeared. The wound is located on the Left,Anterior Lower Leg. The wound measures 0cm length x 0cm width x 0cm depth; 0cm^2 area and 0cm^3 volume. There is no tunneling or undermining noted. There is a none present amount of drainage noted. The wound margin is distinct with the outline attached to the wound base. There is no granulation within the wound bed. There is no necrotic tissue within the wound bed. Wound #3 status is Open. Original cause of wound was Gradually Appeared. The wound is located on the Right,Posterior Calf. The wound measures 0.8cm length x 0.4cm width x 0.1cm depth; 0.251cm^2 area and 0.025cm^3 volume. There is Fat Layer (Subcutaneous Tissue) Exposed exposed. There is no tunneling or undermining noted. There is a small amount of serosanguineous drainage noted. The wound margin is flat and intact. There is large (67-100%) red granulation within the wound bed. There is a small (1-33%) amount of necrotic tissue within the wound bed including Adherent Slough. Wound #9R status is Open. Original cause of wound was Not Known. The wound is located on the Left,Distal,Lateral Lower Leg. The wound measures 2.5cm length x 2.2cm width x 0.1cm depth; 4.32cm^2 area and 0.432cm^3 volume. There is Fat Layer (Subcutaneous Tissue) Exposed exposed. There is no tunneling or undermining  noted. There is a medium amount of serosanguineous drainage noted. The wound margin is flat and intact. There is large (67-100%) red granulation within the wound bed. There is no necrotic tissue within the wound bed. 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 There was a Selective/Open Wound Skin/Epidermis Debridement with a total area of 0.32 sq cm performed by Ricard Dillon., MD. With the following instrument(s): Curette to remove Non-Viable tissue/material. Material removed includes Skin: Dermis and Skin: Epidermis and after achieving pain control using Lidocaine 5% topical ointment. No specimens were taken. A time out was conducted at 08:48, prior to the start of the procedure. A Minimum amount of bleeding was controlled with Pressure. The procedure was tolerated well with a pain level of 0 throughout and a pain level of 0 following the procedure. Post Debridement Measurements: 0.8cm length x 0.4cm width x 0.1cm depth; 0.025cm^3 volume. Character of Wound/Ulcer Post Debridement is improved. Severity of Tissue Post Debridement is: Limited to breakdown of skin. Post procedure Diagnosis Wound #3: Same as Pre-Procedure Pre-procedure diagnosis of Wound #3 is a Diabetic Wound/Ulcer of the Lower Extremity located on the Right,Posterior Calf . There was a Four Layer Compression Therapy Procedure by [name], RN. Post procedure Diagnosis Wound #3: Same as Pre-Procedure Wound #10 Pre-procedure diagnosis of Wound #10 is a Venous Leg Ulcer located on the Left,Medial Lower Leg . There was a Four Layer Compression Therapy Procedure by [name], RN. Post procedure Diagnosis Wound #10: Same as Pre-Procedure Wound #9R Pre-procedure diagnosis of Wound #9R is a Venous Leg Ulcer located on the Left,Distal,Lateral Lower Leg . There was a Four Layer Compression Therapy Procedure by [name], RN. Post procedure  Diagnosis Wound #9R: Same as Pre-Procedure Plan Follow-up Appointments: Return Appointment in 1 week. Dressing Change Frequency: Do not change entire dressing for one week. Skin Barriers/Peri-Wound Care: TCA Cream or Ointment Wound Cleansing: May shower with protection. Primary Wound Dressing: Wound #10 Left,Medial Lower Leg: Hydrofera Blue Wound #3 Right,Posterior Calf: Hydrofera Blue Secondary Dressing: Wound #10 Left,Medial Lower Leg: Dry Gauze ABD pad Wound #3 Right,Posterior Calf: Dry Gauze ABD pad Edema Control: Wound #10 Left,Medial Lower Leg: 4 layer compression - Bilateral Other: - ketoconazole to right and left ankle area, patient to obtain lotrimin and apply to right ankle daily Wound #3 Right,Posterior Calf: 4 layer compression - Bilateral 1. I am continuing with Hydrofera Blue is the primary dressing 2. ABDs and I have increased his compression to 4 layer to see if we can stop some of the weeping edema in these areas although he does not seem to have a lot of edema clinically, the weeping fluid would suggest otherwise Electronic Signature(s) Signed: 07/11/2019 5:42:33 PM By: [name] MD Entered By: [name] on 07/11/2019 09:12:58 -------------------------------------------------------------------------------- SuperBill Details Patient Name: Date of Service: [name], [name] 07/11/2019 Medical Record [id number] Patient Account Number: [id number] Date of Birth/Sex: Treating RN: Jan 16, 1941 (78 y.o. 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 I87.333 Chronic venous hypertension (idiopathic) with ulcer and inflammation of bilateral lower extremity Facility Procedures The patient participates with Medicare or their insurance follows the Medicare Facility Guidelines: CPT4 Code Description Modifier Quantity [id number] - DEBRIDE WOUND 1ST 20 SQ CM OR < 1 ICD-10 Diagnosis Description L97.211 Non-pressure chronic ulcer  of right calf limited to breakdown of skin Physician Procedures CPT4 Code Description: [id number] - WC PHYS DEBR WO ANESTH 20 SQ CM ICD-10 Diagnosis Description L97.211 Non-pressure chronic ulcer of right calf limited to breakdo Modifier: wn of skin Quantity: 1 Electronic Signature(s) Signed: 07/11/2019 5:42:33 PM By: [name] MD Entered By: [name] on 07/11/2019 09:13:23

## 2019-09-05 NOTE — Progress Notes (Signed)
ETHYN, WENDEROTH ([id number]) Visit Report for 08/08/2019 HPI Details Patient Name: Date of Service: [name], [name] 08/08/2019 8:00 AM Medical Record [place] Patient Account Number: [id number] Date of Birth/Sex: Treating RN: [birth year] (78 y.o. [name]) [name] Primary Care Provider: Hurshel Party Other Clinician: Referring Provider: Treating Provider/Extender:Kinser Fellman, Cheryl Flash, [place] Weeks in Treatment: 23 History of Present Illness Location: Patient presents with a wound to left lower leg. Quality: Patient reports No Pain. Duration: 2 months HPI Description: no cig or alcohol. spontaneous appearance in area of stasis dermamtitis. d.m. on metformin only. chronic afib on Coumadin. diabetes and coag studies not good. hba1c 7.5. ivr 4.5. no pain or sxs of systemic disease. hx chf. no intermittent claudication 02/28/2019 Readmission This is a now a 78 year old man who was previously cared for in 2016 by Dr. Lindon Romp for wounds on his lower extremities. At that point he had venous reflux studies although I cannot seem to open these in  link. He had arterial studies showing an ABI of 1.11 on the right and 1.27 on the left his waveforms were triphasic bilaterally. He was discharged in stockings although I do not believe he is wearing these in some time. He tells me that about a month ago he noted openings of a large wound on the posterior right calf and 2 smaller areas on the left lateral calf and a small area more recently on the left posterior calf. He has been dressing these with peroxide and triple antibiotic ointment. He is not wearing compression. Past medical history; type 2 diabetes with peripheral neuropathy, chronic venous insufficiency, hypertension, cardiomyopathy, chronic atrial fibrillation on Coumadin, prostate cancer, hyperlipidemia, gout, ABI in our clinic was 1.34 on the left and not obtainable on the right 6/9; this is a patient who has chronic  venous insufficiency. He has a fairly substantial area on the right posterior calf, left lateral calf and a small area on the left posterior calf. On arrival last week he had very palpable popliteal and femoral pulses but nothing in his bilateral feet. Unfortunately we cannot get arterial studies until July 1 at Dr. Kennon Holter office. They live in [place]. We use silver alginate under Kerlix Coban 6/16; patient with chronic venous insufficiency with wounds on his bilateral lower extremities. When he came into our clinic he was discovered to have a complete absence of peripheral pedal pulses at either the dorsalis pedis or posterior tibial. He does have easily palpable femoral and popliteal pulses. He sees Dr. [name] tomorrow for noninvasive arterial tests. He may also require venous reflux evaluation although I do not view this as an urgent thing. We have been using silver alginate. His wound surfaces of cleaned up quite nicely 6/23; patient with chronic venous insufficiency with wounds on his bilateral lower extremities. His wounds all are somewhat better looking. He did go to Dr. Kennon Holter office but somehow ended up on the doctors schedule rather than being scheduled for noninvasive tests therefore his noninvasive tests are scheduled for July 1. We agree that he has venous insufficiency ulcers but I cannot feel any pulses in his lower extremities dictating the need for test. We are only using Kerlix and light Coban unfortunately this appears to be holding the edema 6/30; has his arterial studies tomorrow. We have been using Kerlix and light Coban will go to a more aggressive compression if the arterial studies will allow. We all agreed these are venous wounds however I cannot feel pulses at either the dorsalis pedis or posterior tibial  bilaterally. His wounds generally look some better including left lateral and right posterior. 7/7-Patient returns at 1 week in Kerlix/Coban to both legs, with  improvement, in the left lateral and right posterior lower leg wounds, ABI's are normal in both legs per vascular studies, TBI is also normal on both sides, we are using hydrofera blue to the wounds 7/14; patient's arterial studies from 2 weeks ago showed an ABI on the right at 1.03 with a TBI of 0.86. On the left the ABI was 1.06 with a TBI of 0.84. Notable for the fact that his arterial waveforms were monophasic in all of the lower extremity arteries suggesting some degree of arterial occlusive disease but in general this was felt to be fairly adequate for healing. His compression was increased from 2-3 layer which is appropriate. Dressing was changed to [facility] 7/21; patient's wounds are measuring smaller. The more substantial one on the right posterior calf, second 1 on the left lateral calf. Using [facility] on both wound areas 7/28; patient continues to make nice improvements. The area on the right posterior calf is smaller. Area on the left lateral calf also is smaller. We have been using Hydrofera Blue under compression. The patient will need compression stockings and we have measured him for these in the eventuality that these heal which really should not be too long from now 8/4-Patient continues to make improvement, the right posterior calf area smaller with rim of keratotic skin on one side, the left wound is definitely smaller and improving. 8/11-Returns at 1 week, after being in 3 layer compression on both legs, both wounds appear to be improving, making good progress, patient is happy, pain is also less especially in the right leg wound 8/18; the area on the left anterior lower leg is healed. On the right posterior leg the wound remains although the dimensions are a lot better. 8/25; he arrives in clinic today with a large body of open wound on the left lateral calf. All of the 3 wounds in this area are in close juxtaposition to each other. The story is that we  discharged him last week with no a wrap on the left leg. They went to [place] could not get in as they are only excepting phone orders or online orders for stockings hence they did not put any stocking on the left leg all week. They have something at home but the patient with that was either incapable or just did not put them on. Apparently these opened 1 morning after getting out of bed. The area on the right has no real change 9/1; patient has bilateral lower extremity wounds in the setting of severe chronic venous insufficiency and secondary lymphedema. He arrived last week with new areas on the left lateral lower leg after we did not wrap him and he did not use his stockings. Nevertheless the areas on the left look better today under compression. Posterior right calf does not really changed. We are using Hydrofera Blue on both areas under compression 9/15; bilateral lower extremity wounds in the setting of severe chronic venous insufficiency and secondary lymphedema. He has 20 to 30 mmHg below-knee compression stockings under the eventuality that these close over. We did get the left leg to close but he did not transition to a stocking and this reopened. There are 2 open areas on the left posterior lateral calf and one on the right. Both of these look satisfactory. Using [facility] 9/22; bilateral lower extremity wounds in the  setting of chronic venous insufficiency. 2 superficial areas on the left lateral calf. One on the right just above the Achilles area. We have good edema control we have been using Hydrofera Blue 9/29; the areas on the left lateral calf are healed. On the right just above the Achilles and tendon area things look a lot better small wounds one scabbed area. We have been using Hydrofera Blue. We can discharge him in his own stocking on the left still wrapping on the right. This is the second time we have healed the left leg but he did not put a stocking on last time.  Hopefully this will maintain the edema from chronic venous disease with secondary lymphedema 10/6; he comes in today having a stocking on the left leg. They had trouble getting it on there is a lot of increase in swelling 2 small open areas one anteriorly and one on the medial calf. They report a lot of difficulty getting the stocking on. Paradoxically the area on the right that we have been wrapping posteriorly is closed 10/13; he comes in today with wounds bilaterally including superficial areas on the left medial and left lateral calf. As well as the right posterior has reopened in the Achilles area superiorly. He still does not have his juxta lite stockings although truthfully we would not of been able to use them today anyway. Apparently have been ordered and paid for from [facility] although they have not been delivered 10/20; his area on the right is just the boat closed on the right posterior. Still has the area on the left lateral and a very tiny area on the left medial. He has his bilateral juxta lites although he is not ready for them this week. He tolerated the increase to 4 layer compression last week quite well 10/27; the area on the right posterior calf is once again closed. He has a superficial area on the left lateral calf that is still open. He has been using Hydrofera Blue and bilateral 4-layer compression. He can change to his own juxta lite stocking on the right and we are instructing him today 11/3; the area on the right posterior calf reopened according to the patient and his wife after they took off the stocking when they got home last week. Apparently scabbed over there is now a fairly substantial wound which looks pretty much the same. Our intake nurse noted that they were using the juxta lite stockings appropriately. I was really hoping I might be able to close him out today. He has 1 very tiny remaining area on the left lateral lower leg. 11/10; right posterior calf wound  measures smaller but is still open. We have been using Hydrofera Blue. On the left he has a small oval-shaped wound and he seems to have had another wound distally that is open and likely a blister. We are using Hydrofera Blue under compression Electronic Signature(s) Signed: 08/08/2019 5:17:49 PM By: [name] MD Entered By: [name] on 08/08/2019 08:35:34 -------------------------------------------------------------------------------- Physical Exam Details Patient Name: Date of Service: MAUDE, KULICH 08/08/2019 8:00 AM Medical Record [id number] Patient Account Number: [id number] Date of Birth/Sex: Treating RN: 09/19/1941 (78 y.o. [name]) [name] Primary Care Provider: Hurshel Party Other Clinician: Referring Provider: Treating Provider/Extender:Diamond Jentz, Cheryl Flash, [place] Weeks in Treatment: 23 Constitutional Sitting or standing Blood Pressure is within target range for patient.. Pulse regular and within target range for patient.Marland Kitchen Respirations regular, non-labored and within target range.. Temperature is normal and within the target range  for the patient.Marland Kitchen Appears in no distress. Eyes Conjunctivae clear. No discharge.no icterus. Respiratory work of breathing is normal. Cardiovascular Pedal pulses palpable and strong bilaterally.. No noticeable edema at the bedside. Integumentary (Hair, Skin) Severe chronic venous hypertension changes hemosiderin deposition in both legs but only minimal edema. Psychiatric appears at normal baseline. Notes Wound exam Wound exam 1. On the left leg there is the original small wound which I think is about the same perhaps slightly smaller. Underneath this but it looks like the remanence of an evacuated blister small open area. Neither of these require debridement 2. On the right the superficial area appears to be better in terms of surface area. No debridement required in either area Electronic Signature(s) Signed:  08/08/2019 5:17:49 PM By: [name] MD Entered By: [name] on 08/08/2019 08:40:09 -------------------------------------------------------------------------------- Physician Orders Details Patient Name: Date of Service: [name] 08/08/2019 8:00 AM Medical Record [id number] Patient Account Number: [id number] Date of Birth/Sex: Treating RN: [birth year] (78 y.o. [name]) [name] Primary Care Provider: Hurshel Party Other Clinician: Referring Provider: Treating Provider/Extender:Bentleigh Stankus, Cheryl Flash, [place] Weeks in Treatment: 38 Verbal / Phone Orders: No Diagnosis Coding ICD-10 Coding Code Description E11.51 Type 2 diabetes mellitus with diabetic peripheral angiopathy without gangrene L97.211 Non-pressure chronic ulcer of right calf limited to breakdown of skin E11.42 Type 2 diabetes mellitus with diabetic polyneuropathy L97.221 Non-pressure chronic ulcer of left calf limited to breakdown of skin I87.333 Chronic venous hypertension (idiopathic) with ulcer and inflammation of bilateral lower extremity Follow-up Appointments Return Appointment in 1 week. Dressing Change Frequency Do not change entire dressing for one week. Skin Barriers/Peri-Wound Care TCA Cream or Ointment Wound Cleansing May shower with protection. Primary Wound Dressing Wound #12 Left,Lateral Ankle Hydrofera Blue Wound #3R Right,Posterior Calf Hydrofera Blue Wound #9R Left,Distal,Lateral Lower Leg Hydrofera Blue Secondary Dressing Wound #12 Left,Lateral Ankle Dry Gauze Wound #3R Right,Posterior Calf Dry Gauze Wound #9R Left,Distal,Lateral Lower Leg Dry Gauze Edema Control 4 layer compression - Bilateral Electronic Signature(s) Signed: 08/08/2019 5:17:49 PM By: [name] MD Signed: 09/05/2019 2:53:59 PM By: [name] RN Entered By: [name] on 08/08/2019 08:31:18 -------------------------------------------------------------------------------- Problem List  Details Patient Name: Date of Service: [name] 08/08/2019 8:00 AM Medical Record [id number] Patient Account Number: [id number] Date of Birth/Sex: Treating RN: 05-01-1941 (78 y.o. [name]) Dolores Lory, [place] Primary Care Provider: Hurshel Party Other Clinician: Referring Provider: Treating Provider/Extender:Abia Monaco, Cheryl Flash, [place] Weeks in Treatment: 23 Active Problems ICD-10 Evaluated Encounter Code Description Active Date Today Diagnosis E11.51 Type 2 diabetes mellitus with diabetic peripheral 02/28/2019 No Yes angiopathy without gangrene L97.211 Non-pressure chronic ulcer of right calf limited to 02/28/2019 No Yes breakdown of skin E11.42 Type 2 diabetes mellitus with diabetic polyneuropathy 02/28/2019 No Yes L97.221 Non-pressure chronic ulcer of left calf limited to 02/28/2019 No Yes breakdown of skin I87.333 Chronic venous hypertension (idiopathic) with ulcer 02/28/2019 No Yes and inflammation of bilateral lower extremity Inactive Problems Resolved Problems Electronic Signature(s) Signed: 08/08/2019 5:17:49 PM By: [name] MD Entered By: [name] on 08/08/2019 08:34:42 -------------------------------------------------------------------------------- Progress Note Details Patient Name: Date of Service: [name] 08/08/2019 8:00 AM Medical Record [id number] Patient Account Number: [id number] Date of Birth/Sex: Treating RN: 08/11/1941 (78 y.o. [name]) [name] Primary Care Provider: Hurshel Party Other Clinician: Referring Provider: Treating Provider/Extender:Yemaya Barnier, Cheryl Flash, [place] Weeks in Treatment: 23 Subjective History of Present Illness (HPI) The following HPI elements were documented for the patient's wound: Location: Patient presents with a wound to left lower leg. Quality: Patient reports No  Pain. Duration: 2 months no cig or alcohol. spontaneous appearance in area of stasis dermamtitis. d.m. on metformin only. chronic  afib on Coumadin. diabetes and coag studies not good. hba1c 7.5. ivr 4.5. no pain or sxs of systemic disease. hx chf. no intermittent claudication 02/28/2019 Readmission This is a now a 78 year old man who was previously cared for in 2016 by Dr. Lindon Romp for wounds on his lower extremities. At that point he had venous reflux studies although I cannot seem to open these in [place] link. He had arterial studies showing an ABI of 1.11 on the right and 1.27 on the left his waveforms were triphasic bilaterally. He was discharged in stockings although I do not believe he is wearing these in some time. He tells me that about a month ago he noted openings of a large wound on the posterior right calf and 2 smaller areas on the left lateral calf and a small area more recently on the left posterior calf. He has been dressing these with peroxide and triple antibiotic ointment. He is not wearing compression. Past medical history; type 2 diabetes with peripheral neuropathy, chronic venous insufficiency, hypertension, cardiomyopathy, chronic atrial fibrillation on Coumadin, prostate cancer, hyperlipidemia, gout, ABI in our clinic was 1.34 on the left and not obtainable on the right 6/9; this is a patient who has chronic venous insufficiency. He has a fairly substantial area on the right posterior calf, left lateral calf and a small area on the left posterior calf. On arrival last week he had very palpable popliteal and femoral pulses but nothing in his bilateral feet. Unfortunately we cannot get arterial studies until July 1 at Dr. Kennon Holter office. They live in [place]. We use silver alginate under Kerlix Coban 6/16; patient with chronic venous insufficiency with wounds on his bilateral lower extremities. When he came into our clinic he was discovered to have a complete absence of peripheral pedal pulses at either the dorsalis pedis or posterior tibial. He does have easily palpable femoral and popliteal  pulses. He sees Dr. [name] tomorrow for noninvasive arterial tests. He may also require venous reflux evaluation although I do not view this as an urgent thing. We have been using silver alginate. His wound surfaces of cleaned up quite nicely 6/23; patient with chronic venous insufficiency with wounds on his bilateral lower extremities. His wounds all are somewhat better looking. He did go to Dr. Kennon Holter office but somehow ended up on the doctors schedule rather than being scheduled for noninvasive tests therefore his noninvasive tests are scheduled for July 1. We agree that he has venous insufficiency ulcers but I cannot feel any pulses in his lower extremities dictating the need for test. We are only using Kerlix and light Coban unfortunately this appears to be holding the edema 6/30; has his arterial studies tomorrow. We have been using Kerlix and light Coban will go to a more aggressive compression if the arterial studies will allow. We all agreed these are venous wounds however I cannot feel pulses at either the dorsalis pedis or posterior tibial bilaterally. His wounds generally look some better including left lateral and right posterior. 7/7-Patient returns at 1 week in Kerlix/Coban to both legs, with improvement, in the left lateral and right posterior lower leg wounds, ABI's are normal in both legs per vascular studies, TBI is also normal on both sides, we are using hydrofera blue to the wounds 7/14; patient's arterial studies from 2 weeks ago showed an ABI on the right at 1.03 with  a TBI of 0.86. On the left the ABI was 1.06 with a TBI of 0.84. Notable for the fact that his arterial waveforms were monophasic in all of the lower extremity arteries suggesting some degree of arterial occlusive disease but in general this was felt to be fairly adequate for healing. His compression was increased from 2-3 layer which is appropriate. Dressing was changed to [facility] 7/21; patient's wounds  are measuring smaller. The more substantial one on the right posterior calf, second 1 on the left lateral calf. Using [facility] on both wound areas 7/28; patient continues to make nice improvements. The area on the right posterior calf is smaller. Area on the left lateral calf also is smaller. We have been using Hydrofera Blue under compression. The patient will need compression stockings and we have measured him for these in the eventuality that these heal which really should not be too long from now 8/4-Patient continues to make improvement, the right posterior calf area smaller with rim of keratotic skin on one side, the left wound is definitely smaller and improving. 8/11-Returns at 1 week, after being in 3 layer compression on both legs, both wounds appear to be improving, making good progress, patient is happy, pain is also less especially in the right leg wound 8/18; the area on the left anterior lower leg is healed. On the right posterior leg the wound remains although the dimensions are a lot better. 8/25; he arrives in clinic today with a large body of open wound on the left lateral calf. All of the 3 wounds in this area are in close juxtaposition to each other. The story is that we discharged him last week with no a wrap on the left leg. They went to [place] could not get in as they are only excepting phone orders or online orders for stockings hence they did not put any stocking on the left leg all week. They have something at home but the patient with that was either incapable or just did not put them on. Apparently these opened 1 morning after getting out of bed. The area on the right has no real change 9/1; patient has bilateral lower extremity wounds in the setting of severe chronic venous insufficiency and secondary lymphedema. He arrived last week with new areas on the left lateral lower leg after we did not wrap him and he did not use his stockings. Nevertheless the areas  on the left look better today under compression. Posterior right calf does not really changed. We are using Hydrofera Blue on both areas under compression 9/15; bilateral lower extremity wounds in the setting of severe chronic venous insufficiency and secondary lymphedema. He has 20 to 30 mmHg below-knee compression stockings under the eventuality that these close over. We did get the left leg to close but he did not transition to a stocking and this reopened. There are 2 open areas on the left posterior lateral calf and one on the right. Both of these look satisfactory. Using [facility] 9/22; bilateral lower extremity wounds in the setting of chronic venous insufficiency. 2 superficial areas on the left lateral calf. One on the right just above the Achilles area. We have good edema control we have been using Hydrofera Blue 9/29; the areas on the left lateral calf are healed. On the right just above the Achilles and tendon area things look a lot better small wounds one scabbed area. We have been using Hydrofera Blue. We can discharge him in his own  stocking on the left still wrapping on the right. This is the second time we have healed the left leg but he did not put a stocking on last time. Hopefully this will maintain the edema from chronic venous disease with secondary lymphedema 10/6; he comes in today having a stocking on the left leg. They had trouble getting it on there is a lot of increase in swelling 2 small open areas one anteriorly and one on the medial calf. They report a lot of difficulty getting the stocking on. ooParadoxically the area on the right that we have been wrapping posteriorly is closed 10/13; he comes in today with wounds bilaterally including superficial areas on the left medial and left lateral calf. As well as the right posterior has reopened in the Achilles area superiorly. He still does not have his juxta lite stockings although truthfully we would not of been  able to use them today anyway. Apparently have been ordered and paid for from [facility] although they have not been delivered 10/20; his area on the right is just the boat closed on the right posterior. Still has the area on the left lateral and a very tiny area on the left medial. He has his bilateral juxta lites although he is not ready for them this week. He tolerated the increase to 4 layer compression last week quite well 10/27; the area on the right posterior calf is once again closed. He has a superficial area on the left lateral calf that is still open. He has been using Hydrofera Blue and bilateral 4-layer compression. He can change to his own juxta lite stocking on the right and we are instructing him today 11/3; the area on the right posterior calf reopened according to the patient and his wife after they took off the stocking when they got home last week. Apparently scabbed over there is now a fairly substantial wound which looks pretty much the same. Our intake nurse noted that they were using the juxta lite stockings appropriately. I was really hoping I might be able to close him out today. He has 1 very tiny remaining area on the left lateral lower leg. 11/10; right posterior calf wound measures smaller but is still open. We have been using Hydrofera Blue. On the left he has a small oval-shaped wound and he seems to have had another wound distally that is open and likely a blister. We are using Hydrofera Blue under compression Objective Constitutional Sitting or standing Blood Pressure is within target range for patient.. Pulse regular and within target range for patient.Marland Kitchen Respirations regular, non-labored and within target range.. Temperature is normal and within the target range for the patient.Marland Kitchen Appears in no distress. Vitals Time Taken: 8:02 AM, Height: 74 in, Source: Stated, Weight: 212 lbs, Source: Stated, BMI: 27.2, Temperature: 97.8 F, Pulse: 79 bpm, Respiratory Rate: 18  breaths/min, Blood Pressure: 140/74 mmHg. Eyes Conjunctivae clear. No discharge.no icterus. Respiratory work of breathing is normal. Cardiovascular Pedal pulses palpable and strong bilaterally.. No noticeable edema at the bedside. Psychiatric appears at normal baseline. General Notes: Wound exam Wound exam 1. On the left leg there is the original small wound which I think is about the same perhaps slightly smaller. Underneath this but it looks like the remanence of an evacuated blister small open area. Neither of these require debridement 2. On the right the superficial area appears to be better in terms of surface area. ooNo debridement required in either area Integumentary (Hair, Skin) Severe chronic venous  hypertension changes hemosiderin deposition in both legs but only minimal edema. Wound #12 status is Open. Original cause of wound was Blister. The wound is located on the Left,Lateral Ankle. The wound measures 0.2cm length x 0.5cm width x 0.1cm depth; 0.079cm^2 area and 0.008cm^3 volume. There is no tunneling or undermining noted. There is a medium amount of serosanguineous drainage noted. There is no granulation within the wound bed. There is no necrotic tissue within the wound bed. Wound #3R status is Open. Original cause of wound was Gradually Appeared. The wound is located on the Right,Posterior Calf. The wound measures 2.1cm length x 2.1cm width x 0.1cm depth; 3.464cm^2 area and 0.346cm^3 volume. There is Fat Layer (Subcutaneous Tissue) Exposed exposed. There is no tunneling or undermining noted. There is a small amount of serosanguineous drainage noted. The wound margin is flat and intact. There is large (67-100%) red granulation within the wound bed. There is no necrotic tissue within the wound bed. Wound #9R status is Open. Original cause of wound was Not Known. The wound is located on the Left,Distal,Lateral Lower Leg. The wound measures 1cm length x 0.9cm width x 0.1cm  depth; 0.707cm^2 area and 0.071cm^3 volume. There is no tunneling or undermining noted. There is a none present amount of drainage noted. The wound margin is flat and intact. There is large (67-100%) pink granulation within the wound bed. There is no necrotic tissue within the wound bed. 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 There was a Four Layer Compression Therapy Procedure by [name], RN. Post procedure Diagnosis Wound #12: Same as Pre-Procedure Wound #3R Pre-procedure diagnosis of Wound #3R is a Diabetic Wound/Ulcer of the Lower Extremity located on the Right,Posterior Calf . There was a Four Layer Compression Therapy Procedure by [name], RN. Post procedure Diagnosis Wound #3R: Same as Pre-Procedure Wound #9R Pre-procedure diagnosis of Wound #9R is a Venous Leg Ulcer located on the Left,Distal,Lateral Lower Leg . There was a Four Layer Compression Therapy Procedure by [name], RN. Post procedure Diagnosis Wound #9R: Same as Pre-Procedure Plan Follow-up Appointments: Return Appointment in 1 week. Dressing Change Frequency: Do not change entire dressing for one week. Skin Barriers/Peri-Wound Care: TCA Cream or Ointment Wound Cleansing: May shower with protection. Primary Wound Dressing: Wound #12 Left,Lateral Ankle: Hydrofera Blue Wound #3R Right,Posterior Calf: Hydrofera Blue Wound #9R Left,Distal,Lateral Lower Leg: Hydrofera Blue Secondary Dressing: Wound #12 Left,Lateral  Ankle: Dry Gauze Wound #3R Right,Posterior Calf: Dry Gauze Wound #9R Left,Distal,Lateral Lower Leg: Dry Gauze Edema Control: 4 layer compression - Bilateral 1. Hydrofera Blue to the 1 wound area posteriorly on the right and the 2 areas on the left including the new distal area this week. 2. I am not certain why he keeps on continuing the form small blisters if indeed that is how they are forming. However this occurred while he was in the wrap this week. It is true these are blisters it would essentially suggest he needs more than 4 layer compression i.e. compression pumps Electronic Signature(s) Signed: 08/08/2019 5:17:49 PM By: [name] MD Entered By: [name] on 08/08/2019 08:42:14 -------------------------------------------------------------------------------- SuperBill  Details Patient Name: Date of Service: JALIEL, WINKEL 08/08/2019 Medical Record [id number] Patient Account Number: [id number] Date of Birth/Sex: Treating RN: [birth year] (78 y.o. 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 Physician  Procedures Electronic Signature(s) Signed: 08/08/2019 5:17:49 PM By: [name] MD Entered By: [name] on 08/08/2019 08:42:36

## 2019-09-05 NOTE — Progress Notes (Signed)
[name], [name] ([id number]) Visit Report for 08/15/2019 Arrival Information Details Patient Name: Date of Service: [name], [name] 08/15/2019 8:00 AM Medical Record [place] Patient Account Number: [id number] Date of Birth/Sex: Treating RN: Jun 30, 1941 (78 y.o. [name] Primary Care Provider: Hurshel Party Other Clinician: Referring Provider: Treating Provider/Extender:Robson, Cheryl Flash, [place] Weeks in Treatment: 24 Visit Information History Since Last Visit Added or deleted any medications: No Patient Arrived: Ambulatory Any new allergies or adverse reactions: No Arrival Time: 07:52 Had a fall or experienced change in No Accompanied By: wife activities of daily living that may affect Transfer Assistance: None risk of falls: Patient Identification Verified: Yes Signs or symptoms of abuse/neglect since last No Secondary Verification Process Yes visito Completed: Hospitalized since last visit: No Patient Requires Transmission- No Implantable device outside of the clinic excluding No Based Precautions: cellular tissue based products placed in the center Patient Has Alerts: Yes since last visit: Patient Alerts: Patient on Blood Has Dressing in Place as Prescribed: Yes Thinner Has Compression in Place as Prescribed: Yes Pain Present Now: No Electronic Signature(s) Signed: 08/17/2019 6:05:31 PM By: Kela Millin Entered By: Kela Millin on 08/15/2019 07:53:19 -------------------------------------------------------------------------------- Compression Therapy Details Patient Name: Date of Service: Daniel Pia D. 08/15/2019 8:00 AM Medical Record [id number] Patient Account Number: [id number] Date of Birth/Sex: Treating RN: 1941-06-12 (78 y.o. [name]) [name] Primary Care Provider: Hurshel Party Other Clinician: Referring Provider: Treating Provider/Extender:Robson, Cheryl Flash, [place] Weeks in Treatment:  24 Compression Therapy Performed for Wound Wound #12 Left,Lateral Lower Leg Assessment: Performed By: Jake Church, RN Compression Type: Three Layer Post Procedure Diagnosis Same as Pre-procedure Electronic Signature(s) Signed: 09/05/2019 2:50:47 PM By: [name] RN Entered By: [name] on 08/15/2019 08:43:29 -------------------------------------------------------------------------------- Compression Therapy Details Patient Name: Date of Service: [name], [name] 08/15/2019 8:00 AM Medical Record [id number] Patient Account Number: [id number] Date of Birth/Sex: Treating RN: 08/20/1941 (78 y.o. [name]) [name] Primary Care Provider: Hurshel Party Other Clinician: Referring Provider: Treating Provider/Extender:Robson, Cheryl Flash, [place] Weeks in Treatment: 24 Compression Therapy Performed for Wound Wound #3R Right,Posterior Calf Assessment: Performed By: Jake Church, RN Compression Type: Three Layer Post Procedure Diagnosis Same as Pre-procedure Electronic Signature(s) Signed: 09/05/2019 2:50:47 PM By: [name] RN Entered By: [name] on 08/15/2019 08:43:38 -------------------------------------------------------------------------------- Encounter Discharge Information Details Patient Name: Date of Service: Daniel Pia D. 08/15/2019 8:00 AM Medical Record [id number] Patient Account Number: [id number] Date of Birth/Sex: Treating RN: [birth year] (78 y.o. [name] Primary Care Provider: Hurshel Party Other Clinician: Referring Provider: Treating Provider/Extender:Robson, Cheryl Flash, [place] Weeks in Treatment: 24 Encounter Discharge Information Items Discharge Condition: Stable Ambulatory Status: Ambulatory Discharge Destination: Home Transportation: Private Auto Accompanied By: wife Schedule Follow-up Appointment: Yes Clinical Summary of Care: Patient Declined Electronic Signature(s) Signed:  08/21/2019 2:18:43 PM By: Levan Hurst RN, BSN Entered By: Levan Hurst on 08/15/2019 09:02:13 -------------------------------------------------------------------------------- Lower Extremity Assessment Details Patient Name: Date of Service: Daniel Pia D. 08/15/2019 8:00 AM Medical Record [id number] Patient Account Number: [id number] Date of Birth/Sex: Treating RN: 04/03/1941 (78 y.o. [name] Primary Care Provider: Hurshel Party Other Clinician: Referring Provider: Treating Provider/Extender:Robson, Cheryl Flash, [place] Weeks in Treatment: 24 Edema Assessment Assessed: [Left: No] [Right: No] Edema: [Left: No] [Right: No] Calf Left: Right: Point of Measurement: 31 cm From Medial Instep 31.9 cm 31 cm Ankle Left: Right: Point of Measurement: 11 cm From Medial Instep 19.9 cm 20.5 cm Vascular Assessment Pulses: Dorsalis Pedis Palpable: [Left:Yes] [Right:Yes] Electronic Signature(s) Signed: 08/17/2019 6:05:31 PM By: Kela Millin  Entered By: Kela Millin on 08/15/2019 08:01:04 -------------------------------------------------------------------------------- Multi Wound Chart Details Patient Name: Date of Service: [name], [name] 08/15/2019 8:00 AM Medical Record [place] Patient Account Number: [id number] Date of Birth/Sex: Treating RN: 19-Apr-1941 (78 y.o. [name]) [name] Primary Care Provider: Hurshel Party Other Clinician: Referring Provider: Treating Provider/Extender:Robson, Cheryl Flash, [place] Weeks in Treatment: 24 Vital Signs Height(in): 74 Pulse(bpm): 70 Weight(lbs): 212 Blood Pressure(mmHg): 146/85 Body Mass Index(BMI): 27 Temperature(F): 98.0 Respiratory 19 Rate(breaths/min): Photos: [12:No Photos] [3R:No Photos] [9R:No Photos] Wound Location: [12:Left Lower Leg - Lateral] [3R:Right Calf - PosteriorLeft Lower Leg - Lateral,] [9R:Distal] Wounding Event: [12:Blister] [3R:Gradually Appeared] [9R:Not  Known] Primary Etiology: [12:Venous Leg Ulcer] [3R:Diabetic Wound/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] Comorbid History: [12:Cataracts, Hypertension, Cataracts, Hypertension, Cataracts, Hypertension, Peripheral Venous Disease, Peripheral Venous Disease, Peripheral Venous Disease, Type II Diabetes, Gout, Received Radiation] [3R:Type II Diabetes, Gout,  Received Radiation] [9R:Type II Diabetes, Gout, Received Radiation] Date Acquired: [12:08/06/2019] [3R:02/28/2019] [9R:05/17/2019] Weeks of Treatment: [12:1] [3R:24] [9R:12] Wound Status: [12:Open] [3R:Open] [9R:Open] Wound Recurrence: [12:No] [3R:Yes] [9R:Yes] Measurements L x W x D 0.9x0.5x0.1 [3R:1.8x2.1x0.1] [9R:0x0x0] (cm) Area (cm) : [12:0.353] [3R:2.969] [9R:0] Volume (cm) : [12:0.035] [9T:9.030] [9R:0] % Reduction in Area: [12:50.10%] [3R:87.90%] [9R:100.00%] % Reduction in Volume: 50.70% [3R:87.90%] [9R:100.00%] Classification: [12:Full Thickness Without Exposed Support Structures] [3R:Grade 2] [9R:Full Thickness Without Exposed Support Structures] Exudate Amount: [12:Small] [3R:Small] [9R:None Present] Exudate Type: [12:Serosanguineous] [3R:Serosanguineous] [9R:N/A] Exudate Color: [12:red, brown] [3R:red, brown] [9R:N/A] Wound Margin: [12:Distinct, outline attached Flat and Intact] [9R:Flat and Intact] Granulation Amount: [12:Large (67-100%)] [3R:Large (67-100%)] [9R:None Present (0%)] Granulation Quality: [12:Pink] [3R:Red] [9R:N/A] Necrotic Amount: [12:None Present (0%)] [3R:Small (1-33%)] [9R:None Present (0%)] Exposed Structures: [12:Fat Layer (Subcutaneous Fat Layer (Subcutaneous Fascia: No Tissue) Exposed: Yes Fascia: No Tendon: No Muscle: No Joint: No Bone: No] [3R:Tissue) Exposed: Yes Fascia: No Tendon: No Muscle: No Joint: No Bone: No] [9R:Fat Layer (Subcutaneous Tissue)  Exposed: No Tendon: No Muscle: No Joint: No Bone: No] Epithelialization: [12:Small (1-33%)] [3R:Small (1-33%) Compression Therapy] [9R:Large  (67-100%) N/A] Treatment Notes Electronic Signature(s) Signed: 08/15/2019 6:07:35 PM By: [name] MD Signed: 09/05/2019 2:50:47 PM By: [name] RN Entered By: [name] on 08/15/2019 08:49:58 -------------------------------------------------------------------------------- Multi-Disciplinary Care Plan Details Patient Name: Date of Service: Daniel Pia D. 08/15/2019 8:00 AM Medical Record [id number] Patient Account Number: [id number] Date of Birth/Sex: Treating RN: [birth year] (78 y.o. Staci Acosta, [place] Hummingbird Primary Care Provider: Hurshel Party Other Clinician: Referring Provider: Treating Provider/Extender:Robson, Cheryl Flash, [place] Weeks in Treatment: 24 Active Inactive Wound/Skin Impairment Nursing Diagnoses: Knowledge deficit related to ulceration/compromised skin integrity Goals: Patient/caregiver will verbalize understanding of skin care regimen Date Initiated: 02/28/2019 Target Resolution Date: 09/01/2019 Goal Status: Active Ulcer/skin breakdown will have a volume reduction of 30% by week 4 Date Initiated: 02/28/2019 Date Inactivated: 04/04/2019 Target Resolution Date: 03/31/2019 Goal Status: Met Ulcer/skin breakdown will have a volume reduction of 50% by week 8 Date Initiated: 04/04/2019 Date Inactivated: 05/09/2019 Target Resolution Date: 05/05/2019 Goal Status: Met Ulcer/skin breakdown will have a volume reduction of 80% by week 12 Date Initiated: 05/09/2019 Date Inactivated: 06/13/2019 Target Resolution Date: 06/09/2019 Unmet Goal Status: Unmet Reason: comorbities/new wounds Ulcer/skin breakdown will heal within 14 weeks Date Initiated: 06/13/2019 Date Inactivated: 07/11/2019 Target Resolution Date: 07/07/2019 Unmet Goal Status: Unmet Reason: comorbityies Interventions: Assess patient/caregiver ability to obtain necessary supplies Assess patient/caregiver ability to perform ulcer/skin care regimen upon admission and as needed Assess ulceration(s) every  visit Notes: Electronic Signature(s) Signed: 09/05/2019 2:50:47 PM By: [name] RN Entered By: Dolores Lory  Carrie on 08/15/2019 07:56:25 -------------------------------------------------------------------------------- Pain Assessment Details Patient Name: Date of Service: [name], [name] 08/15/2019 8:00 AM Medical Record [place] Patient Account Number: [id number] Date of Birth/Sex: Treating RN: September 02, 1941 (78 y.o. [name] Primary Care Provider: Hurshel Party Other Clinician: Referring Provider: Treating Provider/Extender:Robson, Cheryl Flash, [place] Weeks in Treatment: 24 Active Problems Location of Pain Severity and Description of Pain Patient Has Paino No Site Locations Pain Management and Medication Current Pain Management: Electronic Signature(s) Signed: 08/17/2019 6:05:31 PM By: Kela Millin Entered By: Kela Millin on 08/15/2019 07:54:11 -------------------------------------------------------------------------------- Patient/Caregiver Education Details Patient Name: Date of Service: [name], Daniel D. 11/17/2020andnbsp8:00 AM Medical Record [id number] Patient Account Number: [id number] Date of Birth/Gender: Treating RN: 30-Oct-1940 (78 y.o. [name]) [name] Primary Care Physician: Hurshel Party Other Clinician: Referring Physician: Treating Physician/Extender:Robson, Cheryl Flash, [facility] Weeks in Treatment: 24 Education Assessment Education Provided To: Patient Education Topics Provided Wound/Skin Impairment: Methods: Explain/Verbal Responses: State content correctly Electronic Signature(s) Signed: 09/05/2019 2:50:47 PM By: [name] RN Entered By: [name] on 08/15/2019 07:56:40 -------------------------------------------------------------------------------- Wound Assessment Details Patient Name: Date of Service: [name], [name] 08/15/2019 8:00 AM Medical Record [id number] Patient Account Number:  [id number] Date of Birth/Sex: Treating RN: [birth year] (78 y.o. [name] Primary Care Provider: Hurshel Party Other Clinician: Referring Provider: Treating Provider/Extender:Robson, Cheryl Flash, [place] Weeks in Treatment: 24 Wound Status Wound Number: 12 Primary Venous Leg Ulcer Etiology: Wound Location: Left Lower Leg - Lateral Wound Open Wounding Event: Blister Status: Date Acquired: 08/06/2019 Comorbid Cataracts, Hypertension, Peripheral Venous Weeks Of Treatment: 1 History: Disease, Type II Diabetes, Gout, Received Clustered Wound: No Radiation Wound Measurements Length: (cm) 0.9 % Reduct Width: (cm) 0.5 % Reduct Depth: (cm) 0.1 Epitheli Area: (cm) 0.353 Tunneli Volume: (cm) 0.035 Undermi Wound Description Classification: Full Thickness Without Exposed Support Foul Od Structures Slough/ Wound Distinct, outline attached Margin: Exudate Small Amount: Exudate Serosanguineous Type: Exudate red, brown Color: Wound Bed Granulation Amount: Large (67-100%) Granulation Quality: Pink Fascia Necrotic Amount: None Present (0%) Fat Lay Tendon Exp Muscle Exp Joint Expo Bone Expos or After Cleansing: No Fibrino No Exposed Structure Exposed: No er (Subcutaneous Tissue) Exposed: Yes osed: No osed: No sed: No ed: No ion in Area: 50.1% ion in Volume: 50.7% alization: Small (1-33%) ng: No ning: No Electronic Signature(s) Signed: 08/17/2019 6:05:31 PM By: Kela Millin Entered By: Kela Millin on 08/15/2019 08:03:23 -------------------------------------------------------------------------------- Wound Assessment Details Patient Name: Date of Service: Daniel Pia D. 08/15/2019 8:00 AM Medical Record [id number] Patient Account Number: [id number] Date of Birth/Sex: Treating RN: 02/19/1941 (78 y.o. [name] Primary Care Provider: Hurshel Party Other Clinician: Referring Provider: Treating Provider/Extender:Robson,  Cheryl Flash, [name] Weeks in Treatment: 24 Wound Status Wound Number: [id number] Primary Diabetic Wound/Ulcer of the Lower Extremity Etiology: Wound Location: Right Calf - Posterior Wound Open Wounding Event: Gradually Appeared Status: Date Acquired: 02/28/2019 Comorbid Cataracts, Hypertension, Peripheral Venous Weeks Of Treatment: 24 History: Disease, Type II Diabetes, Gout, Received Clustered Wound: No Radiation Wound Measurements Length: (cm) 1.8 % Reduction Width: (cm) 2.1 % Reduction Depth: (cm) 0.1 Epitheliali Area: (cm) 2.969 Tunneling: Volume: (cm) 0.297 Underminin Wound Description Classification: Grade 2 Wound Margin: Flat and Intact Exudate Amount: Small Exudate Type: Serosanguineous Exudate Color: red, brown Wound Bed Granulation Amount: Large (67-100%) Granulation Quality: Red Necrotic Amount: Small (1-33%) Necrotic Quality: Adherent Slough Foul Odor After Cleansing: No Slough/Fibrino Yes Exposed Structure Fascia Exposed: No Fat Layer (Subcutaneous Tissue) Exposed: Yes Tendon Exposed: No Muscle Exposed: No Joint Exposed: No Bone Exposed: No in Area:  87.9% in Volume: 87.9% zation: Small (1-33%) No g: No Electronic Signature(s) Signed: 08/17/2019 6:05:31 PM By: Kela Millin Entered By: Kela Millin on 08/15/2019 08:04:13 -------------------------------------------------------------------------------- Wound Assessment Details Patient Name: Date of Service: [name], Daniel D. 08/15/2019 8:00 AM Medical Record [id number] Patient Account Number: [id number] Date of Birth/Sex: Treating RN: 1941-04-16 (78 y.o. [name] Primary Care Provider: Hurshel Party Other Clinician: Referring Provider: Treating Provider/Extender:Robson, Cheryl Flash, [place] Weeks in Treatment: 24 Wound Status Wound Number: [id number] Primary Venous Leg Ulcer Etiology: Wound Location: Left Lower Leg - Lateral, Distal Wound Open Wounding Event: Not  Known Status: Date Acquired: 05/17/2019 Comorbid Cataracts, Hypertension, Peripheral Venous Weeks Of Treatment: 12 History: Disease, Type II Diabetes, Gout, Received Clustered Wound: No Radiation Wound Measurements Length: (cm) 0 % Reduct Width: (cm) 0 % Reduct Depth: (cm) 0 Epitheli Area: (cm) 0 Tunneli Volume: (cm) 0 Undermi Wound Description Full Thickness Without Exposed Support Foul Od Classification: Structures Slough/ Wound Flat and Intact Margin: Exudate None Present Amount: Wound Bed Granulation Amount: None Present (0%) Necrotic Amount: None Present (0%) Fascia Fat Lay Tendon Muscle Joint E Bone Ex or After Cleansing: No Fibrino No Exposed Structure Exposed: No er (Subcutaneous Tissue) Exposed: No Exposed: No Exposed: No xposed: No posed: No ion in Area: 100% ion in Volume: 100% alization: Large (67-100%) ng: No ning: No Electronic Signature(s) Signed: 08/17/2019 6:05:31 PM By: Kela Millin Entered By: Kela Millin on 08/15/2019 08:03:52 -------------------------------------------------------------------------------- Vitals Details Patient Name: Date of Service: Daniel Pia D. 08/15/2019 8:00 AM Medical Record [id number] Patient Account Number: [id number] Date of Birth/Sex: Treating RN: [birth year] (78 y.o. [name] Primary Care Provider: Hurshel Party Other Clinician: Referring Provider: Treating Provider/Extender:Robson, Cheryl Flash, [place] Weeks in Treatment: 24 Vital Signs Time Taken: 07:50 Temperature (F): 98.0 Height (in): 74 Pulse (bpm): 70 Weight (lbs): 212 Respiratory Rate (breaths/min): 19 Body Mass Index (BMI): 27.2 Blood Pressure (mmHg): 146/85 Reference Range: 80 - 120 mg / dl Electronic Signature(s) Signed: 08/17/2019 6:05:31 PM By: Kela Millin Entered By: Kela Millin on 08/15/2019 07:54:03

## 2019-09-05 NOTE — Progress Notes (Signed)
[name], [name] ([id number]) Visit Report for 07/11/2019 Arrival Information Details Patient Name: Date of Service: [name], [name] 07/11/2019 8:00 AM Medical Record [place] Patient Account Number: [id number] Date of Birth/Sex: Treating RN: November 06, 1940 (78 y.o. [name] Primary Care Aerion Bagdasarian: [place] Party Other Clinician: Referring Tiny Rietz: Treating Hildreth Orsak/Extender:Robson, Cheryl Flash, [place] Weeks in Treatment: 24 Visit Information History Since Last Visit Added or deleted any medications: No Patient Arrived: Ambulatory Any new allergies or adverse reactions: No Arrival Time: 07:56 Had a fall or experienced change in No Accompanied By: wife activities of daily living that may affect Transfer Assistance: None risk of falls: Patient Identification Verified: Yes Signs or symptoms of abuse/neglect since last No Secondary Verification Process Yes visito Completed: Hospitalized since last visit: No Patient Requires Transmission- No Implantable device outside of the clinic excluding No Based Precautions: cellular tissue based products placed in the center Patient Has Alerts: Yes since last visit: Patient Alerts: Patient on Blood Has Dressing in Place as Prescribed: Yes Thinner Has Compression in Place as Prescribed: Yes Pain Present Now: No Electronic Signature(s) Signed: 07/11/2019 5:52:09 PM By: Levan Hurst RN, BSN Entered By: Levan Hurst on 07/11/2019 07:56:19 -------------------------------------------------------------------------------- Compression Therapy Details Patient Name: Date of Service: Daniel Pia D. 07/11/2019 8:00 AM Medical Record [id number] Patient Account Number: [id number] Date of Birth/Sex: Treating RN: 01/01/1941 (78 y.o. [name]) [name] Primary Care Racquelle Hyser: [place]hel Party Other Clinician: Referring Dakiyah Heinke: Treating Shamecca Whitebread/Extender:Robson, Cheryl Flash, [place] Weeks in Treatment:  19 Compression Therapy Performed for Wound Wound #10 Left,Medial Lower Leg Assessment: Performed By: Clinician [name], RN Compression Type: Four Layer Post Procedure Diagnosis Same as Pre-procedure Electronic Signature(s) Signed: 09/05/2019 3:01:15 PM By: [name] RN Entered By: [name] on 07/11/2019 08:51:30 -------------------------------------------------------------------------------- Compression Therapy Details Patient Name: Date of Service: [name], [name] 07/11/2019 8:00 AM Medical Record [id number] Patient Account Number: [id number] Date of Birth/Sex: Treating RN: 03-01-1941 (78 y.o. [name]) [name] Primary Care Niaya Hickok: [place] Party Other Clinician: Referring Karrington Studnicka: Treating Zebulin Siegel/Extender:Robson, Cheryl Flash, [place] Weeks in Treatment: 19 Compression Therapy Performed for Wound Wound #3 Right,Posterior Calf Assessment: Performed By: Jake Church, RN Compression Type: Four Layer Post Procedure Diagnosis Same as Pre-procedure Electronic Signature(s) Signed: 09/05/2019 3:01:15 PM By: [name] RN Entered By: [name] on 07/11/2019 08:51:30 -------------------------------------------------------------------------------- Compression Therapy Details Patient Name: Date of Service: [name], [name] 07/11/2019 8:00 AM Medical Record [id number] Patient Account Number: [id number] Date of Birth/Sex: Treating RN: 07/09/1941 (78 y.o. [name]) [name] Primary Care Illiana Losurdo: [place] Party Other Clinician: Referring Rosina Cressler: Treating Sherley Mckenney/Extender:Robson, Cheryl Flash, [place] Weeks in Treatment: 19 Compression Therapy Performed for Wound Wound #9R Left,Distal,Lateral Lower Leg Assessment: Performed By: Clinician [name], RN Compression Type: Four Layer Post Procedure Diagnosis Same as Pre-procedure Electronic Signature(s) Signed: 09/05/2019 3:01:15 PM By: [name] RN Entered By: [name] on  07/11/2019 08:51:30 -------------------------------------------------------------------------------- Encounter Discharge Information Details Patient Name: Date of Service: Daniel Pia D. 07/11/2019 8:00 AM Medical Record [id number] Patient Account Number: [id number] Date of Birth/Sex: Treating RN: [birth year] (78 y.o. [name] Primary Care Zein Helbing: [place]hel Party Other Clinician: Referring Ansley Mangiapane: Treating Sandeep Radell/Extender:Robson, Cheryl Flash, [place] Weeks in Treatment: 33 Encounter Discharge Information Items Post Procedure Vitals Discharge Condition: Stable Temperature (F): 97.8 Ambulatory Status: Ambulatory Pulse (bpm): 85 Discharge Destination: Home Respiratory Rate (breaths/min): 18 Transportation: Private Auto Blood Pressure (mmHg): 115/63 Accompanied By: Spouse Schedule Follow-up Appointment: Yes Clinical Summary of Care: Electronic Signature(s) Signed: 07/11/2019 5:52:09 PM By: Levan Hurst RN, BSN Entered By: Levan Hurst on 07/11/2019  09:09:58 -------------------------------------------------------------------------------- Lower Extremity Assessment Details Patient Name: Date of Service: [name], [name] 07/11/2019 8:00 AM Medical Record [place] Patient Account Number: [id number] Date of Birth/Sex: Treating RN: 06/14/1941 (78 y.o. [name] Primary Care Naleyah Ohlinger: [place]hel Party Other Clinician: Referring Shomari Matusik: Treating Hisayo Delossantos/Extender:Robson, Cheryl Flash, [place] Weeks in Treatment: 19 Edema Assessment Assessed: [Left: No] [Right: No] Edema: [Left: No] [Right: No] Calf Left: Right: Point of Measurement: 31 cm From Medial Instep 31 cm 33.5 cm Ankle Left: Right: Point of Measurement: 11 cm From Medial Instep 21 cm 21.4 cm Vascular Assessment Pulses: Dorsalis Pedis Palpable: [Left:Yes] [Right:Yes] Electronic Signature(s) Signed: 07/11/2019 5:52:09 PM By: Levan Hurst RN, BSN Entered By: Levan Hurst on 07/11/2019 08:01:55 -------------------------------------------------------------------------------- Multi Wound Chart Details Patient Name: Date of Service: Daniel Pia D. 07/11/2019 8:00 AM Medical Record [id number] Patient Account Number: [id number] Date of Birth/Sex: Treating RN: 06/04/1941 (78 y.o. [name]) [name] Primary Care Brayden Brodhead: H[place]hel Party Other Clinician: Referring Abayomi Pattison: Treating Fabrizzio Marcella/Extender:Robson, Cheryl Flash, [place] Weeks in Treatment: 19 Vital Signs Height(in): 74 Pulse(bpm): 89 Weight(lbs): 212 Blood Pressure(mmHg): 115/63 Body Mass Index(BMI): 27 Temperature(F): 97.8 Respiratory 18 Rate(breaths/min): Photos: [10:No Photos] [11:No Photos] [3:No Photos] Wound Location: [10:Left Lower Leg - Medial] [11:Left Lower Leg - Anterior] [3:Right Calf - Posterior] Wounding Event: [10:Gradually Appeared] [11:Gradually Appeared] [3:Gradually Appeared] Primary Etiology: [10:Venous Leg Ulcer] [11:Diabetic Wound/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] [3:Lower Extremity] Comorbid History: [10:Cataracts, Hypertension, Cataracts, Hypertension, Cataracts, Hypertension, Peripheral Venous Disease, Peripheral Venous Disease, Peripheral Venous Disease, Type II Diabetes, Gout, Received Radiation] [11:Type II Diabetes, Gout,  Received Radiation] [3:Type II Diabetes, Gout, Received Radiation] Date Acquired: [10:05/17/2019] [11:06/29/2019] [3:02/28/2019] Weeks of Treatment: [10:7] [11:1] [3:19] Wound Status: [10:Open] [11:Open] [3:Open] Wound Recurrence: [10:No] [11:No] [3:No] Clustered Wound: [10:Yes] [11:No] [3:No] Clustered Quantity: [10:3] [11:N/A] [3:N/A] Measurements L x W x D 2.2x0.8x0.1 [11:0x0x0] [3:0.8x0.4x0.1] (cm) Area (cm) : [10:1.382] [11:0] [3:0.251] Volume (cm) : [10:0.138] [11:0] [3:0.025] % Reduction in Area: [10:-46.70%] [11:100.00%] [3:99.00%] % Reduction in Volume: [10:-46.80%] [11:100.00%]  [3:99.00%] Classification: [10:Full Thickness Without Exposed Support Structures] [11:Grade 2] [3:Grade 2] Exudate Amount: [10:Medium] [11:None Present] [3:Small] Exudate Type: [10:Serosanguineous] [11:N/A] [3:Serosanguineous] Exudate Color: [10:red, brown] [11:N/A] [3:red, brown] Wound Margin: [10:Distinct, outline attached Distinct, outline attached] [3:Flat and Intact] Granulation Amount: [10:Large (67-100%)] [11:None Present (0%)] [3:Large (67-100%)] Granulation Quality: [10:Pink, Pale] [11:N/A] [3:Red] Necrotic Amount: [10:None Present (0%)] [11:None Present (0%)] [3:Small (1-33%)] Exposed Structures: [10:Fat Layer (Subcutaneous Fascia: No Tissue) Exposed: Yes Fascia: No Tendon: No Muscle: No Joint: No Bone: No] [11:Fat Layer (Subcutaneous Tissue) Exposed: No Tendon: No Muscle: No Joint: No Bone: No] [3:Fat Layer (Subcutaneous Tissue) Exposed: Yes  Fascia: No Tendon: No Muscle: No Joint: No Bone: No] Epithelialization: [10:Medium (34-66%)] [11:Large (67-100%)] [3:Large (67-100%)] Debridement: [10:N/A] [11:N/A] [3:Debridement - Selective/Open Wound] Pre-procedure [10:N/A] [11:N/A] [3:08:48] Verification/Time Out Taken: Pain Control: [10:N/A] [11:N/A] [3:Lidocaine 5% topical ointment] Level: [10:N/A] [11:N/A] [3:Skin/Epidermis] Debridement Area (sq cm):N/A [11:N/A] [3:0.32] Instrument: [10:N/A] [11:N/A] [3:Curette] Bleeding: [10:N/A] [11:N/A] [3:Minimum] Hemostasis Achieved: [10:N/A] [11:N/A] [3:Pressure] Procedural Pain: [10:N/A] [11:N/A] [3:0] Post Procedural Pain: [10:N/A] [11:N/A] [3:0] Debridement Treatment N/A [11:N/A] [3:Procedure was tolerated] Response: [3:well] Post Debridement [10:N/A] [11:N/A] [3:0.8x0.4x0.1] Measurements L x W x D (cm) Post Debridement [10:N/A] [11:N/A] [3:0.025] Volume: (cm) Procedures Performed: Compression Therapy [10:9R] [11:N/A] [3:Compression Therapy Debridement N/A] Photos: [10:No Photos] [11:N/A] [3:N/A] Wound Location: [10:Left Lower Leg -  Lateral, N/A Distal] [3:N/A] Wounding Event: [10:Not Known] [11:N/A] [3:N/A] Primary Etiology: [10:Venous Leg Ulcer] [11:N/A] [3:N/A] Comorbid History: [10:Cataracts, Hypertension, N/A Peripheral Venous Disease,  Type II Diabetes, Gout, Received Radiation] [3:N/A] Date Acquired: [10:05/17/2019] [11:N/A] [3:N/A] Weeks of Treatment: [10:7] [11:N/A] [3:N/A] Wound Status: [10:Open] [11:N/A] [3:N/A] Wound Recurrence: [10:Yes] [11:N/A] [3:N/A] Clustered Wound: [10:No] [11:N/A] [3:N/A] Clustered Quantity: [10:N/A] [11:N/A] [3:N/A] Measurements L x W x D 2.5x2.2x0.1 [11:N/A] [3:N/A] (cm) Area (cm) : [10:4.32] [11:N/A] [3:N/A] Volume (cm) : [10:0.432] [11:N/A] [3:N/A] % Reduction in Area: [10:50.90%] [11:N/A] [3:N/A] % Reduction in Volume: [10:50.90%] [11:N/A] [3:N/A] Classification: [10:Full Thickness Without Exposed Support Structures] [11:N/A] [3:N/A] Exudate Amount: [10:Medium] [11:N/A] [3:N/A] Exudate Type: [10:Serosanguineous] [11:N/A] [3:N/A] Exudate Color: [10:red, brown] [11:N/A] [3:N/A] Wound Margin: [10:Flat and Intact] [11:N/A] [3:N/A] Granulation Amount: [10:Large (67-100%)] [11:N/A] [3:N/A] Granulation Quality: [10:Red] [11:N/A] [3:N/A] Necrotic Amount: [10:None Present (0%)] [11:N/A] [3:N/A] Exposed Structures: [10:Fat Layer (Subcutaneous N/A Tissue) Exposed: Yes Fascia: No Tendon: No Muscle: No Joint: No Bone: No] [3:N/A] Epithelialization: [10:None] [11:N/A] [3:N/A] Debridement: [10:N/A] [11:N/A] [3:N/A] Pain Control: [10:N/A] [11:N/A] [3:N/A] Level: [10:N/A] [11:N/A] [3:N/A] Debridement Area (sq cm):N/A [11:N/A] [3:N/A] Instrument: [10:N/A] [11:N/A] [3:N/A] Bleeding: [10:N/A] [11:N/A] [3:N/A] Hemostasis Achieved: [10:N/A] [11:N/A] [3:N/A] Procedural Pain: [10:N/A] [11:N/A] [3:N/A] Post Procedural Pain: [10:N/A] [11:N/A] [3:N/A] Debridement Treatment N/A [11:N/A] [3:N/A] Response: Post Debridement [10:N/A] [11:N/A] [3:N/A] Measurements L x W x D (cm) Post Debridement  [10:N/A] [11:N/A] [3:N/A] Volume: (cm) Procedures Performed: Compression Therapy [11:N/A] [3:N/A] Treatment Notes Electronic Signature(s) Signed: 07/11/2019 5:42:33 PM By: [name] MD Signed: 09/05/2019 3:01:15 PM By: [name] RN Entered By: [name] on 07/11/2019 09:08:09 -------------------------------------------------------------------------------- Multi-Disciplinary Care Plan Details Patient Name: Date of Service: Daniel Pia D. 07/11/2019 8:00 AM Medical Record [id number] Patient Account Number: [id number] Date of Birth/Sex: Treating RN: Dec 22, 1940 (78 y.o. Staci Acosta, Morey Hummingbird Primary Care Shannen Flans[place]: [place]hel Party Other Clinician: Referring Xaiden Fleig: Treating Rielynn Trulson/Extender:Robson, Cheryl Flash, [place] Weeks in Treatment: 19 Active Inactive Wound/Skin Impairment Nursing Diagnoses: Knowledge deficit related to ulceration/compromised skin integrity Goals: Patient/caregiver will verbalize understanding of skin care regimen Date Initiated: 02/28/2019 Target Resolution Date: 08/04/2019 Goal Status: Active Ulcer/skin breakdown will have a volume reduction of 30% by week 4 Date Initiated: 02/28/2019 Date Inactivated: 04/04/2019 Target Resolution Date: 03/31/2019 Goal Status: Met Ulcer/skin breakdown will have a volume reduction of 50% by week 8 Date Initiated: 04/04/2019 Date Inactivated: 05/09/2019 Target Resolution Date: 05/05/2019 Goal Status: Met Ulcer/skin breakdown will have a volume reduction of 80% by week 12 Date Initiated: 05/09/2019 Date Inactivated: 06/13/2019 Target Resolution Date: 06/09/2019 Unmet Goal Status: Unmet Reason: comorbities/new wounds Ulcer/skin breakdown will heal within 14 weeks Date Initiated: 06/13/2019 Date Inactivated: 07/11/2019 Target Resolution Date: 07/07/2019 Unmet Goal Status: Unmet Reason: comorbityies Interventions: Assess patient/caregiver ability to obtain necessary supplies Assess patient/caregiver ability to  perform ulcer/skin care regimen upon admission and as needed Assess ulceration(s) every visit Notes: Electronic Signature(s) Signed: 09/05/2019 3:01:15 PM By: [name] RN Entered By: [name] on 07/11/2019 08:13:50 -------------------------------------------------------------------------------- Pain Assessment Details Patient Name: Date of Service: [name], [name] 07/11/2019 8:00 AM Medical Record [id number] Patient Account Number: [id number] Date of Birth/Sex: Treating RN: [birth year] (78 y.o. [name] Primary Care Charnelle Bergeman: [place] Party Other Clinician: Referring Jiyah Torpey: Treating Lareen Mullings/Extender:Robson, Cheryl Flash, [place] Weeks in Treatment: 36 Active Problems Location of Pain Severity and Description of Pain Patient Has Paino No Site Locations Pain Management and Medication Current Pain Management: Electronic Signature(s) Signed: 07/11/2019 5:52:09 PM By: Levan Hurst RN, BSN Entered By: Levan Hurst on 07/11/2019 07:56:30 -------------------------------------------------------------------------------- Patient/Caregiver Education Details Patient Name: Date of Service: [name] 10/13/2020andnbsp8:00 AM Medical Record [id number] Patient Account Number: [id number] Date of Birth/Gender: Treating RN: [birth year] (78  y.o. [name] Primary Care Physician: H[place]hel Party Other Clinician: Referring Physician: Treating Physician/Extender:Robson, Cheryl Flash, [facility] [place] Weeks in Treatment: 31 Education Assessment Education Provided To: Patient Education Topics Provided Wound/Skin Impairment: Methods: Explain/Verbal Responses: See progress note Electronic Signature(s) Signed: 09/05/2019 3:01:15 PM By: [name] RN Entered By: [name] on 07/11/2019 08:14:09 -------------------------------------------------------------------------------- Wound Assessment Details Patient Name: Date of Service: [name], [name] 07/11/2019 8:00 AM Medical Record [id number] Patient Account Number: [id number] Date of Birth/Sex: Treating RN: 10-23-1940 (78 y.o. [name] Primary Care Delila Kuklinski: [place] Party Other Clinician: Referring Malakye Nolden: Treating Kelce Bouton/Extender:Robson, Cheryl Flash, [place] Weeks in Treatment: 19 Wound Status Wound Number: 10 Primary Venous Leg Ulcer Etiology: Wound Location: Left Lower Leg - Medial Wound Open Wounding Event: Gradually Appeared Status: Date Acquired: 05/17/2019 Comorbid Cataracts, Hypertension, Peripheral Venous Weeks Of Treatment: 7 History: Disease, Type II Diabetes, Gout, Received Clustered Wound: Yes Radiation Photos Wound Measurements Length: (cm) 2.2 % Reduct Width: (cm) 0.8 % Reduct Depth: (cm) 0.1 Epitheli Clustered Quantity: 3 Tunnelin Area: (cm) 1.382 Undermi Volume: (cm) 0.138 Wound Description Classification: Full Thickness Without Exposed Support Foul Od Structures Slough/ Wound Distinct, outline attached Margin: Exudate Medium Amount: Exudate Serosanguineous Type: Exudate red, brown Color: Wound Bed Granulation Amount: Large (67-100%) Granulation Quality: Pink, Pale Fascia Necrotic Amount: None Present (0%) Fat Lay Tendon Muscle Joint Expo Bone Expos or After Cleansing: No Fibrino No Exposed Structure Exposed: No er (Subcutaneous Tissue) Exposed: Yes Exposed: No Exposed: No sed: No ed: No ion in Area: -46.7% ion in Volume: -46.8% alization: Medium (34-66%) g: No ning: No Electronic Signature(s) Signed: 07/14/2019 6:00:55 PM By: Levan Hurst RN, BSN Signed: 08/02/2019 4:01:42 PM By: Mikeal Hawthorne EMT/HBOT Previous Signature: 07/11/2019 5:52:09 PM Version By: Levan Hurst RN, BSN Entered By: Mikeal Hawthorne on 07/12/2019 09:20:20 -------------------------------------------------------------------------------- Wound Assessment Details Patient Name: Date of Service: Daniel Pia D. 07/11/2019  8:00 AM Medical Record [id number] Patient Account Number: [id number] Date of Birth/Sex: Treating RN: 10/18/1940 (78 y.o. [name] Primary Care Jamas Jaquay: H[place]hel Party Other Clinician: Referring Mckenzye Cutright: Treating Darcell Yacoub/Extender:Robson, Cheryl Flash, [place] Weeks in Treatment: 19 Wound Status Wound Number: 11 Primary Diabetic Wound/Ulcer of the Lower Extremity Etiology: Wound Location: Left Lower Leg - Anterior Wound Healed - Epithelialized Wounding Event: Gradually Appeared Status: Date Acquired: 06/29/2019 Comorbid Cataracts, Hypertension, Peripheral Venous Weeks Of Treatment: 1 History: Disease, Type II Diabetes, Gout, Received Clustered Wound: No Radiation Photos Wound Measurements Length: (cm) 0 % Reductio Width: (cm) 0 % Reductio Depth: (cm) 0 Epithelial Area: (cm) 0 Tunneling Volume: (cm) 0 Undermini Wound Description Classification: Grade 2 Foul Odor Wound Margin: Distinct, outline attached Slough/Fib Exudate Amount: None Present Wound Bed Granulation Amount: None Present (0%) Necrotic Amount: None Present (0%) Fascia Exp Fat Layer Tendon Exp Muscle Exp Joint Expo Bone Expos After Cleansing: No rino No Exposed Structure osed: No (Subcutaneous Tissue) Exposed: No osed: No osed: No sed: No ed: No n in Area: 100% n in Volume: 100% ization: Large (67-100%) : No ng: No Electronic Signature(s) Signed: 07/14/2019 6:00:55 PM By: Levan Hurst RN, BSN Signed: 08/02/2019 4:01:42 PM By: Mikeal Hawthorne EMT/HBOT Previous Signature: 07/11/2019 5:52:09 PM Version By: Levan Hurst RN, BSN Entered By: Mikeal Hawthorne on 07/12/2019 09:21:16 -------------------------------------------------------------------------------- Wound Assessment Details Patient Name: Date of Service: [name], [name] 07/11/2019 8:00 AM Medical Record [id number] Patient Account Number: [id number] Date of Birth/Sex: Treating RN: 1941-06-29 (78 y.o. [name] Primary Care Judie Hollick: [place] Party Other Clinician: Referring Sheena Donegan: Treating Rainna Nearhood/Extender:Robson, Cheryl Flash, [facility]  Weeks in Treatment: 19 Wound Status Wound Number: 3 Primary Diabetic Wound/Ulcer of the Lower Extremity Etiology: Wound Location: Right Calf - Posterior Wound Open Wounding Event: Gradually Appeared Status: Date Acquired: 02/28/2019 Comorbid Cataracts, Hypertension, Peripheral Venous Weeks Of Treatment: 19 History: Disease, Type II Diabetes, Gout, Received Clustered Wound: No Radiation Photos Wound Measurements Length: (cm) 0.8 Width: (cm) 0.4 Depth: (cm) 0.1 Area: (cm) 0.251 Volume: (cm) 0.025 Wound Description Classification: Grade 2 Wound Margin: Flat and Intact Exudate Amount: Small Exudate Type: Serosanguineous Exudate Color: red, brown Wound Bed Granulation Amount: Large (67-100%) Granulation Quality: Red Necrotic Amount: Small (1-33%) Necrotic Quality: Adherent Slough After Cleansing: No brino No Exposed Structure posed: No (Subcutaneous Tissue) Exposed: Yes posed: No posed: No osed: No sed: No % Reduction in Area: 99% % Reduction in Volume: 99% Epithelialization: Large (67-100%) Tunneling: No Undermining: No Foul Odor Slough/Fi Fascia Ex Fat Layer Tendon Ex Muscle Ex Joint Exp Bone Expo Electronic Signature(s) Signed: 07/14/2019 6:00:55 PM By: Levan Hurst RN, BSN Signed: 08/02/2019 4:01:42 PM By: Mikeal Hawthorne EMT/HBOT Previous Signature: 07/11/2019 5:52:09 PM Version By: Levan Hurst RN, BSN Entered By: Mikeal Hawthorne on 07/12/2019 09:21:37 -------------------------------------------------------------------------------- Wound Assessment Details Patient Name: Date of Service: Daniel Pia D. 07/11/2019 8:00 AM Medical Record [id number] Patient Account Number: [id number] Date of Birth/Sex: Treating RN: 06/02/1941 (78 y.o. [name] Primary Care Sheral Pfahler: [place] Party Other  Clinician: Referring Adia Crammer: Treating Josclyn Rosales/Extender:Robson, Cheryl Flash, [place] Weeks in Treatment: 19 Wound Status Wound Number: 9R Primary Venous Leg Ulcer Etiology: Wound Location: Left Lower Leg - Lateral, Distal Wound Open Wounding Event: Not Known Status: Date Acquired: 05/17/2019 Comorbid Cataracts, Hypertension, Peripheral Venous Weeks Of Treatment: 7 History: Disease, Type II Diabetes, Gout, Received Clustered Wound: No Radiation Photos Wound Measurements Length: (cm) 2.5 % Reducti Width: (cm) 2.2 % Reducti Depth: (cm) 0.1 Epithelia Area: (cm) 4.32 Tunneli Volume: (cm) 0.432 Undermi Wound Description Classification: Full Thickness Without Exposed Support Foul Od Structures Slough/ Wound Flat and Intact Margin: Exudate Medium Amount: Exudate Serosanguineous Type: Exudate red, brown Color: Wound Bed Granulation Amount: Large (67-100%) Granulation Quality: Red Fascia Necrotic Amount: None Present (0%) Fat Lay Tendon Muscle Joint E Bone Ex or After Cleansing: No Fibrino No Exposed Structure Exposed: No er (Subcutaneous Tissue) Exposed: Yes Exposed: No Exposed: No xposed: No posed: No on in Area: 50.9% on in Volume: 50.9% lization: None ng: No ning: No Electronic Signature(s) Signed: 07/14/2019 6:00:55 PM By: Levan Hurst RN, BSN Signed: 08/02/2019 4:01:42 PM By: Mikeal Hawthorne EMT/HBOT Previous Signature: 07/11/2019 5:52:09 PM Version By: Levan Hurst RN, BSN Entered By: Mikeal Hawthorne on 07/12/2019 09:20:46 -------------------------------------------------------------------------------- [place] Details Patient Name: Date of Service: Daniel Pia D. 07/11/2019 8:00 AM Medical Record [id number] Patient Account Number: [id number] Date of Birth/Sex: Treating RN: [birth year] (78 y.o. [name] Primary Care Ashaya Raftery: [place] Party Other Clinician: Referring Paarth Cropper: Treating Keena Heesch/Extender:Robson,  Cheryl Flash, [place] Weeks in Treatment: 19 Vital Signs Time Taken: 07:57 Temperature (F): 97.8 Height (in): 74 Pulse (bpm): 85 Weight (lbs): 212 Respiratory Rate (breaths/min): 18 Body Mass Index (BMI): 27.2 Blood Pressure (mmHg): 115/63 Reference Range: 80 - 120 mg / dl Electronic Signature(s) Signed: 07/11/2019 5:52:09 PM By: Levan Hurst RN, BSN Entered By: Levan Hurst on 07/11/2019 07:57:45

## 2019-09-05 NOTE — Progress Notes (Signed)
SIERRA, BISSONETTE ([id number]) Visit Report for 09/05/2019 HPI Details Patient Name: Date of Service: [name], [name] 09/05/2019 8:00 AM Medical Record [name] Patient Account Number: [id number] Date of Birth/Sex: Treating RN: Feb 06, 1941 (78 y.o. [name]) [name] Primary Care Provider: Hurshel Gross Other Clinician: Referring Provider: Treating Provider/Extender:[name], [name], [place] Weeks in Treatment: 81 History of Present Illness Location: Patient presents with a wound to left lower leg. Quality: Patient reports No Pain. Duration: 2 months HPI Description: no cig or alcohol. spontaneous appearance in area of stasis dermamtitis. [name] on metformin only. chronic afib on Coumadin. diabetes and coag studies not good. hba1c 7.5. ivr 4.5. no pain or sxs of systemic disease. hx chf. no intermittent claudication 02/28/2019 Readmission This is a now a 78 year old man who was previously cared for in 2016 by [name] for wounds on his lower extremities. At that point he had venous reflux studies although I cannot seem to open these in [place] link. He had arterial studies showing an ABI of 1.11 on the right and 1.27 on the left his waveforms were triphasic bilaterally. He was discharged in stockings although I do not believe he is wearing these in some time. He tells me that about a month ago he noted openings of a large wound on the posterior right calf and 2 smaller areas on the left lateral calf and a small area more recently on the left posterior calf. He has been dressing these with peroxide and triple antibiotic ointment. He is not wearing compression. Past medical history; type 2 diabetes with peripheral neuropathy, chronic venous insufficiency, hypertension, cardiomyopathy, chronic atrial fibrillation on Coumadin, prostate cancer, hyperlipidemia, gout, ABI in our clinic was 1.34 on the left and not obtainable on the right 6/9; this is a patient who has chronic  venous insufficiency. He has a fairly substantial area on the right posterior calf, left lateral calf and a small area on the left posterior calf. On arrival last week he had very palpable popliteal and femoral pulses but nothing in his bilateral feet. Unfortunately we cannot get arterial studies until July 1 at [name] office. They live in [place]. We use silver alginate under Kerlix Coban 6/16; patient with chronic venous insufficiency with wounds on his bilateral lower extremities. When he came into our clinic he was discovered to have a complete absence of peripheral pedal pulses at either the dorsalis pedis or posterior tibial. He does have easily palpable femoral and popliteal pulses. He sees [name] tomorrow for noninvasive arterial tests. He may also require venous reflux evaluation although I do not view this as an urgent thing. We have been using silver alginate. His wound surfaces of cleaned up quite nicely 6/23; patient with chronic venous insufficiency with wounds on his bilateral lower extremities. His wounds all are somewhat better looking. He did go to [name] office but somehow ended up on the doctors schedule rather than being scheduled for noninvasive tests therefore his noninvasive tests are scheduled for July 1. We agree that he has venous insufficiency ulcers but I cannot feel any pulses in his lower extremities dictating the need for test. We are only using Kerlix and light Coban unfortunately this appears to be holding the edema 6/30; has his arterial studies tomorrow. We have been using Kerlix and light Coban will go to a more aggressive compression if the arterial studies will allow. We all agreed these are venous wounds however I cannot feel pulses at either the dorsalis pedis or posterior tibial  bilaterally. His wounds generally look some better including left lateral and right posterior. 7/7-Patient returns at 1 week in Kerlix/Coban to both legs, with  improvement, in the left lateral and right posterior lower leg wounds, ABI's are normal in both legs per vascular studies, TBI is also normal on both sides, we are using hydrofera blue to the wounds 7/14; patient's arterial studies from 2 weeks ago showed an ABI on the right at 1.03 with a TBI of 0.86. On the left the ABI was 1.06 with a TBI of 0.84. Notable for the fact that his arterial waveforms were monophasic in all of the lower extremity arteries suggesting some degree of arterial occlusive disease but in general this was felt to be fairly adequate for healing. His compression was increased from 2-3 layer which is appropriate. Dressing was changed to [facility] 7/21; patient's wounds are measuring smaller. The more substantial one on the right posterior calf, second 1 on the left lateral calf. Using [facility] on both wound areas 7/28; patient continues to make nice improvements. The area on the right posterior calf is smaller. Area on the left lateral calf also is smaller. We have been using Hydrofera Blue under compression. The patient will need compression stockings and we have measured him for these in the eventuality that these heal which really should not be too long from now 8/4-Patient continues to make improvement, the right posterior calf area smaller with rim of keratotic skin on one side, the left wound is definitely smaller and improving. 8/11-Returns at 1 week, after being in 3 layer compression on both legs, both wounds appear to be improving, making good progress, patient is happy, pain is also less especially in the right leg wound 8/18; the area on the left anterior lower leg is healed. On the right posterior leg the wound remains although the dimensions are a lot better. 8/25; he arrives in clinic today with a large body of open wound on the left lateral calf. All of the 3 wounds in this area are in close juxtaposition to each other. The story is that we  discharged him last week with no a wrap on the left leg. They went to [place] could not get in as they are only excepting phone orders or online orders for stockings hence they did not put any stocking on the left leg all week. They have something at home but the patient with that was either incapable or just did not put them on. Apparently these opened 1 morning after getting out of bed. The area on the right has no real change 9/1; patient has bilateral lower extremity wounds in the setting of severe chronic venous insufficiency and secondary lymphedema. He arrived last week with new areas on the left lateral lower leg after we did not wrap him and he did not use his stockings. Nevertheless the areas on the left look better today under compression. Posterior right calf does not really changed. We are using Hydrofera Blue on both areas under compression 9/15; bilateral lower extremity wounds in the setting of severe chronic venous insufficiency and secondary lymphedema. He has 20 to 30 mmHg below-knee compression stockings under the eventuality that these close over. We did get the left leg to close but he did not transition to a stocking and this reopened. There are 2 open areas on the left posterior lateral calf and one on the right. Both of these look satisfactory. Using [facility] 9/22; bilateral lower extremity wounds in the  setting of chronic venous insufficiency. 2 superficial areas on the left lateral calf. One on the right just above the Achilles area. We have good edema control we have been using Hydrofera Blue 9/29; the areas on the left lateral calf are healed. On the right just above the Achilles and tendon area things look a lot better small wounds one scabbed area. We have been using Hydrofera Blue. We can discharge him in his own stocking on the left still wrapping on the right. This is the second time we have healed the left leg but he did not put a stocking on last time.  Hopefully this will maintain the edema from chronic venous disease with secondary lymphedema 10/6; he comes in today having a stocking on the left leg. They had trouble getting it on there is a lot of increase in swelling 2 small open areas one anteriorly and one on the medial calf. They report a lot of difficulty getting the stocking on. Paradoxically the area on the right that we have been wrapping posteriorly is closed 10/13; he comes in today with wounds bilaterally including superficial areas on the left medial and left lateral calf. As well as the right posterior has reopened in the Achilles area superiorly. He still does not have his juxta lite stockings although truthfully we would not of been able to use them today anyway. Apparently have been ordered and paid for from [employer] although they have not been delivered 10/20; his area on the right is just the boat closed on the right posterior. Still has the area on the left lateral and a very tiny area on the left medial. He has his bilateral juxta lites although he is not ready for them this week. He tolerated the increase to 4 layer compression last week quite well 10/27; the area on the right posterior calf is once again closed. He has a superficial area on the left lateral calf that is still open. He has been using Hydrofera Blue and bilateral 4-layer compression. He can change to his own juxta lite stocking on the right and we are instructing him today 11/3; the area on the right posterior calf reopened according to the patient and his wife after they took off the stocking when they got home last week. Apparently scabbed over there is now a fairly substantial wound which looks pretty much the same. Our intake nurse noted that they were using the juxta lite stockings appropriately. I was really hoping I might be able to close him out today. He has 1 very tiny remaining area on the left lateral lower leg. 11/10; right posterior calf wound  measures smaller but is still open. We have been using Hydrofera Blue. On the left he has a small oval-shaped wound and he seems to have had another wound distally that is open and likely a blister. We are using Hydrofera Blue under compression 11/17; right posterior calf wound continues to get better. We have been using Hydrofera Blue. On the left lateral one of the wounds has closed still a small open area. We have been using Hydrofera Blue on this as well. Both areas have been under 4-layer compression Arrives in clinic today with some swelling in the dorsal foot on the right some erythema of his forefoot and toes. Initially when I looked at this I almost thought this was a sunburn distal to a wrap injury. 12/1; right posterior calf wound debrided with a curette. We have been using Hydrofera Blue on the  left anterior lateral he has an area across the mid tibia. Finally a small area on the left lateral lower calf. Finally he continues to have de-epithelialized areas on the dorsal aspect of his toes. Initially thought this might be a burn injury when I saw him 2 weeks ago. I now wonder about tinea. I have also reviewed his arterial studies which were really quite good in July/20 with normal TBI's and ABIs but monophasic waveforms 12/8; comes in today with worsening problems especially on the left leg where he now has a cluster of wounds in the left anterior mid tibia. Very poor edema control. I reduced him to 3 layer from 4 layer compression last week because of some concern about blood flow to his toes however he does not have good edema control on the left leg. Right leg edema control looks satisfactory. On the left he has a cluster of wounds anteriorly, small area on the left medial fifth met head and then the collection of areas on his toes which appear better On the right he has the original area on the right posterior calf, a new area right medially. His formal arterial studies from mid  July are noted below. He was evaluated by [facility] ABI Findings: +---------+------------------+-----+----------+--------+ Right Rt Pressure (mmHg)IndexWaveform Comment  +---------+------------------+-----+----------+--------+ Brachial 176     +---------+------------------+-----+----------+--------+ ATA 176 0.99 monophasic  +---------+------------------+-----+----------+--------+ PTA 183 1.03 monophasic  +---------+------------------+-----+----------+--------+ PERO 172 0.97 monophasic  +---------+------------------+-----+----------+--------+ Great Toe153 0.86    +---------+------------------+-----+----------+--------+ +---------+------------------+-----+-----------+-------+ Left Lt Pressure (mmHg)IndexWaveform Comment +---------+------------------+-----+-----------+-------+ Brachial 178     +---------+------------------+-----+-----------+-------+ ATA 162 0.91 multiphasic  +---------+------------------+-----+-----------+-------+ PTA 188 1.06 multiphasic  +---------+------------------+-----+-----------+-------+ PERO 158 0.89 monophasic   +---------+------------------+-----+-----------+-------+ Great Toe150 0.84    +---------+------------------+-----+-----------+-------+ +-------+-----------+-----------+------------+------------+ ABI/TBIToday's ABIToday's TBIPrevious ABIPrevious TBI +-------+-----------+-----------+------------+------------+ Right 1.03 0.86 1.11   +-------+-----------+-----------+------------+------------+ Left 1.06 0.84 1.27   +-------+-----------+-----------+------------+------------+ Tibial waveforms somewhat difficult to record due to irregular heartbeat. Bilateral ABIs appear essentially unchanged compared to prior study on 06/21/15. Summary: Right: Resting right ankle-brachial index is within normal range. No evidence of significant right lower extremity arterial disease.  The right toe-brachial index is normal. Although ankle brachial indices are within normal limits (0.95-1.29), arterial Doppler waveforms at the ankle suggest some component of arterial occlusive disease. Left: Resting left ankle-brachial index is within normal range. No evidence of significant left lower extremity arterial disease. The left toe-brachial index is normal. Although ankle brachial indices are within normal limits (0.95-1.29), arterial Doppler waveforms at the ankle suggest some component of arterial occlusive disease. Electronic Signature(s) Signed: 09/05/2019 5:53:37 PM By: [name] MD Entered By: [name] on 09/05/2019 09:42:26 -------------------------------------------------------------------------------- Physical Exam Details Patient Name: Date of Service: [name], [name] 09/05/2019 8:00 AM Medical Record [id number] Patient Account Number: [id number] Date of Birth/Sex: Treating RN: [birth year] (78 y.o. [name]) [name] Primary Care Provider: Hurshel Gross Other Clinician: Referring Provider: Treating Provider/Extender:Nija Koopman, [name], [place] Weeks in Treatment: 27 Constitutional Sitting or standing Blood Pressure is within target range for patient.. Pulse regular and within target range for patient.Marland Kitchen Respirations regular, non-labored and within target range.. Temperature is normal and within the target range for the patient.Marland Kitchen Appears in no distress. Cardiovascular Dorsalis pedis pulses are palpable bilaterally dorsalis pedis pulse palpable on the left but not the right both his popliteal pulses are palpable. Notes Wound exam; again more extensive on the left. Very poor edema control on the left. Surfaces do not look particularly viable. The original right posterior calf wound looks about the same. He has a new  area on the right medial leg. The superficial areas on his toes looks somewhat better. Electronic Signature(s) Signed: 09/05/2019  5:53:37 PM By: [name] MD Entered By: [name] on 09/05/2019 09:41:00 -------------------------------------------------------------------------------- Physician Orders Details Patient Name: Date of Service: Daniel Pia D. 09/05/2019 8:00 AM Medical Record [id number] Patient Account Number: [id number] Date of Birth/Sex: Treating RN: [birth year] (78 y.o. [name]) [name] Primary Care Provider: Hurshel Gross Other Clinician: Referring Provider: Treating Provider/Extender:Kamylle Axelson, [name], [place] Weeks in Treatment: 82 Verbal / Phone Orders: No Diagnosis Coding ICD-10 Coding Code Description E11.51 Type 2 diabetes mellitus with diabetic peripheral angiopathy without gangrene L97.211 Non-pressure chronic ulcer of right calf limited to breakdown of skin E11.42 Type 2 diabetes mellitus with diabetic polyneuropathy L97.221 Non-pressure chronic ulcer of left calf limited to breakdown of skin I87.333 Chronic venous hypertension (idiopathic) with ulcer and inflammation of bilateral lower extremity Follow-up Appointments Return Appointment in 1 week. Dressing Change Frequency Wound #12 Left,Lateral Lower Leg Do not change entire dressing for one week. Wound #18 Left,Medial Foot Do not change entire dressing for one week. Wound #13 Left Toe Second Change dressing every day. Wound #14 Left Toe Third Change dressing every day. Wound #15 Left,Anterior Lower Leg Do not change entire dressing for one week. Wound #16 Left,Distal,Anterior Lower Leg Do not change entire dressing for one week. Wound #17 Right,Medial Lower Leg Do not change entire dressing for one week. Wound #3R Right,Posterior Calf Do not change entire dressing for one week. Skin Barriers/Peri-Wound Care Wound #18 Left,Medial Foot TCA Cream or Ointment Wound Cleansing May shower with protection. Primary Wound Dressing Wound #12 Left,Lateral Lower Leg Hydrofera Blue Wound #18 Left,Medial  Foot Hydrofera Blue Wound #13 Left Toe Second Other: - ketoconasole Wound #14 Left Toe Third Other: - ketoconasole Wound #15 Left,Anterior Lower Leg Hydrofera Blue Wound #16 Left,Distal,Anterior Lower Leg Hydrofera Blue Wound #17 Right,Medial Lower Leg Hydrofera Blue Wound #3R Right,Posterior Calf Hydrofera Blue Secondary Dressing Wound #12 Left,Lateral Lower Leg Dry Gauze ABD pad Wound #13 Left Toe Second Dry Gauze Wound #18 Left,Medial Foot Dry Gauze Wound #14 Left Toe Third Dry Gauze Wound #15 Left,Anterior Lower Leg Dry Gauze ABD pad Wound #16 Left,Distal,Anterior Lower Leg Dry Gauze ABD pad Wound #17 Right,Medial Lower Leg Dry Gauze ABD pad Wound #3R Right,Posterior Calf Dry Gauze ABD pad Edema Control Wound #12 Left,Lateral Lower Leg Unna Boots Bilaterally - with kerlix coban overlay Wound #15 Left,Anterior Lower Leg Unna Boots Bilaterally - with kerlix coban overlay Wound #16 Left,Distal,Anterior Lower Leg Unna Boots Bilaterally - with kerlix coban overlay Wound #17 Right,Medial Lower Leg Unna Boots Bilaterally - with kerlix coban overlay Wound #3R Right,Posterior Calf Unna Boots Bilaterally - with kerlix coban overlay Electronic Signature(s) Signed: 09/05/2019 2:52:36 PM By: Daniel Coria RN Signed: 09/05/2019 5:53:37 PM By: [name] MD Entered By: [name] on 09/05/2019 09:16:21 -------------------------------------------------------------------------------- Problem List Details Patient Name: Date of Service: Daniel Pia D. 09/05/2019 8:00 AM Medical Record [id number] Patient Account Number: [id number] Date of Birth/Sex: Treating RN: [birth year] (78 y.o. [name]) Dolores Lory, [place] Primary Care Provider: Hurshel Gross Other Clinician: Referring Provider: Treating Provider/Extender:Chrystal Zeimet, [name], [place] Weeks in Treatment: 27 Active Problems ICD-10 Evaluated Encounter Code Description Active Date Today Diagnosis E11.51 Type 2  diabetes mellitus with diabetic peripheral 02/28/2019 No Yes angiopathy without gangrene L97.211 Non-pressure chronic ulcer of right calf limited to 02/28/2019 No Yes breakdown of skin E11.42 Type 2 diabetes mellitus with diabetic polyneuropathy 02/28/2019 No Yes L97.221 Non-pressure chronic ulcer of left calf limited  to 02/28/2019 No Yes breakdown of skin I87.333 Chronic venous hypertension (idiopathic) with ulcer 02/28/2019 No Yes and inflammation of bilateral lower extremity B35.3 Tinea pedis 09/05/2019 No Yes Inactive Problems Resolved Problems Electronic Signature(s) Signed: 09/05/2019 5:53:37 PM By: [name] MD Entered By: [name] on 09/05/2019 09:36:26 -------------------------------------------------------------------------------- Progress Note Details Patient Name: Date of Service: Daniel Pia D. 09/05/2019 8:00 AM Medical Record [id number] Patient Account Number: [id number] Date of Birth/Sex: Treating RN: [birth year] (78 y.o. [name]) [name] Primary Care Provider: Hurshel Gross Other Clinician: Referring Provider: Treating Provider/Extender:Shalika Arntz, [name], [place] Weeks in Treatment: 27 Subjective History of Present Illness (HPI) The following HPI elements were documented for the patient's wound: Location: Patient presents with a wound to left lower leg. Quality: Patient reports No Pain. Duration: 2 months no cig or alcohol. spontaneous appearance in area of stasis dermamtitis. [name] on metformin only. chronic afib on Coumadin. diabetes and coag studies not good. hba1c 7.5. ivr 4.5. no pain or sxs of systemic disease. hx chf. no intermittent claudication 02/28/2019 Readmission This is a now a 78 year old man who was previously cared for in 2016 by [name] for wounds on his lower extremities. At that point he had venous reflux studies although I cannot seem to open these in [place] link. He had arterial studies showing an ABI of 1.11 on the  right and 1.27 on the left his waveforms were triphasic bilaterally. He was discharged in stockings although I do not believe he is wearing these in some time. He tells me that about a month ago he noted openings of a large wound on the posterior right calf and 2 smaller areas on the left lateral calf and a small area more recently on the left posterior calf. He has been dressing these with peroxide and triple antibiotic ointment. He is not wearing compression. Past medical history; type 2 diabetes with peripheral neuropathy, chronic venous insufficiency, hypertension, cardiomyopathy, chronic atrial fibrillation on Coumadin, prostate cancer, hyperlipidemia, gout, ABI in our clinic was 1.34 on the left and not obtainable on the right 6/9; this is a patient who has chronic venous insufficiency. He has a fairly substantial area on the right posterior calf, left lateral calf and a small area on the left posterior calf. On arrival last week he had very palpable popliteal and femoral pulses but nothing in his bilateral feet. Unfortunately we cannot get arterial studies until July 1 at [name] office. They live in [place]. We use silver alginate under Kerlix Coban 6/16; patient with chronic venous insufficiency with wounds on his bilateral lower extremities. When he came into our clinic he was discovered to have a complete absence of peripheral pedal pulses at either the dorsalis pedis or posterior tibial. He does have easily palpable femoral and popliteal pulses. He sees [name] tomorrow for noninvasive arterial tests. He may also require venous reflux evaluation although I do not view this as an urgent thing. We have been using silver alginate. His wound surfaces of cleaned up quite nicely 6/23; patient with chronic venous insufficiency with wounds on his bilateral lower extremities. His wounds all are somewhat better looking. He did go to [name] office but somehow ended up on the doctors  schedule rather than being scheduled for noninvasive tests therefore his noninvasive tests are scheduled for July 1. We agree that he has venous insufficiency ulcers but I cannot feel any pulses in his lower extremities dictating the need for test. We are only using Kerlix and light Coban  unfortunately this appears to be holding the edema 6/30; has his arterial studies tomorrow. We have been using Kerlix and light Coban will go to a more aggressive compression if the arterial studies will allow. We all agreed these are venous wounds however I cannot feel pulses at either the dorsalis pedis or posterior tibial bilaterally. His wounds generally look some better including left lateral and right posterior. 7/7-Patient returns at 1 week in Kerlix/Coban to both legs, with improvement, in the left lateral and right posterior lower leg wounds, ABI's are normal in both legs per vascular studies, TBI is also normal on both sides, we are using hydrofera blue to the wounds 7/14; patient's arterial studies from 2 weeks ago showed an ABI on the right at 1.03 with a TBI of 0.86. On the left the ABI was 1.06 with a TBI of 0.84. Notable for the fact that his arterial waveforms were monophasic in all of the lower extremity arteries suggesting some degree of arterial occlusive disease but in general this was felt to be fairly adequate for healing. His compression was increased from 2-3 layer which is appropriate. Dressing was changed to [facility] 7/21; patient's wounds are measuring smaller. The more substantial one on the right posterior calf, second 1 on the left lateral calf. Using [facility] on both wound areas 7/28; patient continues to make nice improvements. The area on the right posterior calf is smaller. Area on the left lateral calf also is smaller. We have been using Hydrofera Blue under compression. The patient will need compression stockings and we have measured him for these in the  eventuality that these heal which really should not be too long from now 8/4-Patient continues to make improvement, the right posterior calf area smaller with rim of keratotic skin on one side, the left wound is definitely smaller and improving. 8/11-Returns at 1 week, after being in 3 layer compression on both legs, both wounds appear to be improving, making good progress, patient is happy, pain is also less especially in the right leg wound 8/18; the area on the left anterior lower leg is healed. On the right posterior leg the wound remains although the dimensions are a lot better. 8/25; he arrives in clinic today with a large body of open wound on the left lateral calf. All of the 3 wounds in this area are in close juxtaposition to each other. The story is that we discharged him last week with no a wrap on the left leg. They went to [facility] could not get in as they are only excepting phone orders or online orders for stockings hence they did not put any stocking on the left leg all week. They have something at home but the patient with that was either incapable or just did not put them on. Apparently these opened 1 morning after getting out of bed. The area on the right has no real change 9/1; patient has bilateral lower extremity wounds in the setting of severe chronic venous insufficiency and secondary lymphedema. He arrived last week with new areas on the left lateral lower leg after we did not wrap him and he did not use his stockings. Nevertheless the areas on the left look better today under compression. Posterior right calf does not really changed. We are using Hydrofera Blue on both areas under compression 9/15; bilateral lower extremity wounds in the setting of severe chronic venous insufficiency and secondary lymphedema. He has 20 to 30 mmHg below-knee compression stockings under the eventuality  that these close over. We did get the left leg to close but he did not transition to a  stocking and this reopened. There are 2 open areas on the left posterior lateral calf and one on the right. Both of these look satisfactory. Using [facility] 9/22; bilateral lower extremity wounds in the setting of chronic venous insufficiency. 2 superficial areas on the left lateral calf. One on the right just above the Achilles area. We have good edema control we have been using Hydrofera Blue 9/29; the areas on the left lateral calf are healed. On the right just above the Achilles and tendon area things look a lot better small wounds one scabbed area. We have been using Hydrofera Blue. We can discharge him in his own stocking on the left still wrapping on the right. This is the second time we have healed the left leg but he did not put a stocking on last time. Hopefully this will maintain the edema from chronic venous disease with secondary lymphedema 10/6; he comes in today having a stocking on the left leg. They had trouble getting it on there is a lot of increase in swelling 2 small open areas one anteriorly and one on the medial calf. They report a lot of difficulty getting the stocking on. ooParadoxically the area on the right that we have been wrapping posteriorly is closed 10/13; he comes in today with wounds bilaterally including superficial areas on the left medial and left lateral calf. As well as the right posterior has reopened in the Achilles area superiorly. He still does not have his juxta lite stockings although truthfully we would not of been able to use them today anyway. Apparently have been ordered and paid for from [employer] although they have not been delivered 10/20; his area on the right is just the boat closed on the right posterior. Still has the area on the left lateral and a very tiny area on the left medial. He has his bilateral juxta lites although he is not ready for them this week. He tolerated the increase to 4 layer compression last week quite well 10/27;  the area on the right posterior calf is once again closed. He has a superficial area on the left lateral calf that is still open. He has been using Hydrofera Blue and bilateral 4-layer compression. He can change to his own juxta lite stocking on the right and we are instructing him today 11/3; the area on the right posterior calf reopened according to the patient and his wife after they took off the stocking when they got home last week. Apparently scabbed over there is now a fairly substantial wound which looks pretty much the same. Our intake nurse noted that they were using the juxta lite stockings appropriately. I was really hoping I might be able to close him out today. He has 1 very tiny remaining area on the left lateral lower leg. 11/10; right posterior calf wound measures smaller but is still open. We have been using Hydrofera Blue. On the left he has a small oval-shaped wound and he seems to have had another wound distally that is open and likely a blister. We are using Hydrofera Blue under compression 11/17; right posterior calf wound continues to get better. We have been using Hydrofera Blue. On the left lateral one of the wounds has closed still a small open area. We have been using Hydrofera Blue on this as well. Both areas have been under 4-layer compression Arrives in  clinic today with some swelling in the dorsal foot on the right some erythema of his forefoot and toes. Initially when I looked at this I almost thought this was a sunburn distal to a wrap injury. 12/1; right posterior calf wound debrided with a curette. We have been using Hydrofera Blue on the left anterior lateral he has an area across the mid tibia. Finally a small area on the left lateral lower calf. Finally he continues to have de-epithelialized areas on the dorsal aspect of his toes. Initially thought this might be a burn injury when I saw him 2 weeks ago. I now wonder about tinea. I have also reviewed his  arterial studies which were really quite good in July/20 with normal TBI's and ABIs but monophasic waveforms 12/8; comes in today with worsening problems especially on the left leg where he now has a cluster of wounds in the left anterior mid tibia. Very poor edema control. I reduced him to 3 layer from 4 layer compression last week because of some concern about blood flow to his toes however he does not have good edema control on the left leg. Right leg edema control looks satisfactory. ooOn the left he has a cluster of wounds anteriorly, small area on the left medial fifth met head and then the collection of areas on his toes which appear better ooOn the right he has the original area on the right posterior calf, a new area right medially. His formal arterial studies from mid July are noted below. He was evaluated by [facility] ABI Findings: +---------+------------------+-----+----------+--------+ Right Rt Pressure (mmHg)IndexWaveform Comment  +---------+------------------+-----+----------+--------+ Brachial 176     +---------+------------------+-----+----------+--------+ ATA 176 0.99 monophasic  +---------+------------------+-----+----------+--------+ PTA 183 1.03 monophasic  +---------+------------------+-----+----------+--------+ PERO 172 0.97 monophasic  +---------+------------------+-----+----------+--------+ Great Toe153 0.86    +---------+------------------+-----+----------+--------+ +---------+------------------+-----+-----------+-------+ Left Lt Pressure (mmHg)IndexWaveform Comment +---------+------------------+-----+-----------+-------+ Brachial 178     +---------+------------------+-----+-----------+-------+ ATA 162 0.91 multiphasic  +---------+------------------+-----+-----------+-------+ PTA 188 1.06 multiphasic  +---------+------------------+-----+-----------+-------+ PERO 158 0.89 monophasic    +---------+------------------+-----+-----------+-------+ Great Toe150 0.84    +---------+------------------+-----+-----------+-------+ +-------+-----------+-----------+------------+------------+ ABI/TBIToday's ABIToday's TBIPrevious ABIPrevious TBI +-------+-----------+-----------+------------+------------+ Right 1.03 0.86 1.11   +-------+-----------+-----------+------------+------------+ Left 1.06 0.84 1.27   +-------+-----------+-----------+------------+------------+ Tibial waveforms somewhat difficult to record due to irregular heartbeat. Bilateral ABIs appear essentially unchanged compared to prior study on 06/21/15. Summary: Right: Resting right ankle-brachial index is within normal range. No evidence of significant right lower extremity arterial disease. The right toe-brachial index is normal. Although ankle brachial indices are within normal limits (0.95-1.29), arterial Doppler waveforms at the ankle suggest some component of arterial occlusive disease. Left: Resting left ankle-brachial index is within normal range. No evidence of significant left lower extremity arterial disease. The left toe-brachial index is normal. Although ankle brachial indices are within normal limits (0.95-1.29), arterial Doppler waveforms at the ankle suggest some component of arterial occlusive disease. Objective Constitutional Sitting or standing Blood Pressure is within target range for patient.. Pulse regular and within target range for patient.Marland Kitchen Respirations regular, non-labored and within target range.. Temperature is normal and within the target range for the patient.Marland Kitchen Appears in no distress. Vitals Time Taken: 8:30 AM, Height: 74 in, Weight: 212 lbs, BMI: 27.2, Temperature: 98.1 F, Pulse: 82 bpm, Respiratory Rate: 20 breaths/min, Blood Pressure: 126/79 mmHg. Cardiovascular Dorsalis pedis pulses are palpable bilaterally dorsalis pedis pulse palpable on the left but not  the right both his popliteal pulses are palpable. General Notes: Wound exam; again more extensive on the left. Very poor edema control on the left. Surfaces do not look  particularly viable. ooThe original right posterior calf wound looks about the same. He has a new area on the right medial leg. ooThe superficial areas on his toes looks somewhat better. Integumentary (Hair, Skin) Wound #12 status is Open. Original cause of wound was Blister. The wound is located on the Left,Lateral Lower Leg. The wound measures 0.2cm length x 0.2cm width x 0.1cm depth; 0.031cm^2 area and 0.003cm^3 volume. There is Fat Layer (Subcutaneous Tissue) Exposed exposed. There is no tunneling noted. There is a small amount of serous drainage noted. There is large (67-100%) red, pink, pale granulation within the wound bed. There is no necrotic tissue within the wound bed. Wound #13 status is Open. Original cause of wound was Gradually Appeared. The wound is located on the Left Toe Second. The wound measures 0.5cm length x 0.7cm width x 0.1cm depth; 0.275cm^2 area and 0.027cm^3 volume. There is Fat Layer (Subcutaneous Tissue) Exposed exposed. There is no tunneling or undermining noted. There is a medium amount of serosanguineous drainage noted. The wound margin is distinct with the outline attached to the wound base. There is large (67-100%) red, pink granulation within the wound bed. There is no necrotic tissue within the wound bed. Wound #14 status is Open. Original cause of wound was Gradually Appeared. The wound is located on the Left Toe Third. The wound measures 0.2cm length x 0.2cm width x 0.1cm depth; 0.031cm^2 area and 0.003cm^3 volume. There is no tunneling or undermining noted. There is a medium amount of serosanguineous drainage noted. The wound margin is distinct with the outline attached to the wound base. There is large (67-100%) red, pink granulation within the wound bed. There is no necrotic tissue  within the wound bed. Wound #15 status is Open. Original cause of wound was Blister. The wound is located on the Left,Anterior Lower Leg. The wound measures 3.2cm length x 5.6cm width x 0.2cm depth; 14.074cm^2 area and 2.815cm^3 volume. There is Fat Layer (Subcutaneous Tissue) Exposed exposed. There is no tunneling or undermining noted. There is a medium amount of serous drainage noted. There is medium (34-66%) red granulation within the wound bed. There is a medium (34-66%) amount of necrotic tissue within the wound bed including Adherent Slough. Wound #16 status is Open. Original cause of wound was Blister. The wound is located on the [facility] Lower Leg. The wound measures 0.6cm length x 0.5cm width x 0.1cm depth; 0.236cm^2 area and 0.024cm^3 volume. There is Fat Layer (Subcutaneous Tissue) Exposed exposed. There is no tunneling or undermining noted. There is a medium amount of serous drainage noted. The wound margin is distinct with the outline attached to the wound base. There is medium (34-66%) granulation within the wound bed. There is a medium (34-66%) amount of necrotic tissue within the wound bed including Adherent Slough. Wound #17 status is Open. Original cause of wound was Blister. The wound is located on the Right,Medial Lower Leg. The wound measures 1.1cm length x 1.5cm width x 0.1cm depth; 1.296cm^2 area and 0.13cm^3 volume. There is Fat Layer (Subcutaneous Tissue) Exposed exposed. There is no tunneling or undermining noted. There is a medium amount of serous drainage noted. The wound margin is distinct with the outline attached to the wound base. There is large (67-100%) pink granulation within the wound bed. There is a small (1-33%) amount of necrotic tissue within the wound bed including Adherent Slough. Wound #18 status is Open. Original cause of wound was Gradually Appeared. The wound is located on the Left,Medial Foot. The wound measures  1cm length x 1.3cm width x  0.1cm depth; 1.021cm^2 area and 0.102cm^3 volume. There is no tunneling or undermining noted. There is a medium amount of serosanguineous drainage noted. The wound margin is flat and intact. There is no granulation within the wound bed. There is no necrotic tissue within the wound bed. Wound #3R status is Open. Original cause of wound was Gradually Appeared. The wound is located on the Right,Posterior Calf. The wound measures 2.1cm length x 1.9cm width x 0.1cm depth; 3.134cm^2 area and 0.313cm^3 volume. There is Fat Layer (Subcutaneous Tissue) Exposed exposed. There is no tunneling or undermining noted. There is a medium amount of serous drainage noted. The wound margin is distinct with the outline attached to the wound base. There is medium (34-66%) red, pink, pale granulation within the wound bed. There is a medium (34-66%) amount of necrotic tissue within the wound bed including Adherent Slough. 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 There was a Haematologist Compression Therapy Procedure by Daniel Coria, RN. Post procedure Diagnosis Wound #12: Same as Pre-Procedure Wound #15 Pre-procedure diagnosis of Wound #15 is a Diabetic Wound/Ulcer of the Lower Extremity located on the Left,Anterior Lower Leg . There was a Haematologist Compression Therapy Procedure by Daniel Coria, RN. Post procedure Diagnosis Wound #15: Same as Pre-Procedure Wound #16 Pre-procedure diagnosis of Wound #16 is a Venous Leg Ulcer  located on the Left,Distal,Anterior Lower Leg . There was a Haematologist Compression Therapy Procedure by Daniel Coria, RN. Post procedure Diagnosis Wound #16: Same as Pre-Procedure Wound #17 Pre-procedure diagnosis of Wound #17 is a Venous Leg Ulcer located on the Right,Medial Lower Leg . There was a Haematologist Compression Therapy Procedure by Daniel Coria, RN. Post procedure Diagnosis Wound #17: Same as Pre-Procedure Wound #18 Pre-procedure diagnosis of Wound #18 is a Diabetic Wound/Ulcer of the Lower Extremity located on the Left,Medial Foot . There was a Haematologist Compression Therapy Procedure by Daniel Coria, RN. Post procedure Diagnosis Wound #18: Same as Pre-Procedure Wound #3R Pre-procedure diagnosis of Wound #3R is a Diabetic Wound/Ulcer of the Lower Extremity located on the Right,Posterior Calf . There was a Haematologist Compression Therapy Procedure by Daniel Coria, RN. Post procedure Diagnosis Wound #3R: Same as Pre-Procedure Plan Follow-up Appointments: Return Appointment in 1 week. Dressing Change Frequency: Wound #12 Left,Lateral Lower Leg: Do not change entire dressing for one week. Wound #18 Left,Medial Foot: Do not change entire dressing for one week. Wound #13 Left Toe Second: Change dressing every day. Wound #14 Left Toe Third: Change dressing every day. Wound #15 Left,Anterior Lower Leg: Do not change entire dressing for one week. Wound #16 Left,Distal,Anterior Lower Leg: Do not change entire dressing for one week. Wound #17 Right,Medial Lower Leg: Do not change entire dressing for one week. Wound #3R Right,Posterior Calf: Do not change entire dressing for one week. Skin Barriers/Peri-Wound Care: Wound #18 Left,Medial Foot: TCA Cream or Ointment Wound Cleansing: May shower with protection. Primary Wound Dressing: Wound #12 Left,Lateral Lower Leg: Hydrofera Blue Wound #18 Left,Medial Foot: Hydrofera Blue Wound #13 Left Toe Second: Other: -  ketoconasole Wound #14 Left Toe Third: Other: - ketoconasole Wound #15 Left,Anterior Lower Leg: Hydrofera Blue Wound #16 Left,Distal,Anterior Lower Leg: Hydrofera Blue Wound #17 Right,Medial Lower Leg: Hydrofera Blue Wound #3R Right,Posterior Calf: Hydrofera Blue Secondary Dressing: Wound #12 Left,Lateral Lower Leg: Dry Gauze ABD pad Wound #13 Left Toe Second: Dry Gauze Wound #18 Left,Medial Foot: Dry Gauze Wound #14 Left Toe Third: Dry Gauze Wound #15 Left,Anterior Lower Leg: Dry Gauze ABD pad Wound #16 Left,Distal,Anterior Lower Leg: Dry Gauze ABD pad Wound #17 Right,Medial Lower Leg: Dry Gauze ABD pad Wound #3R Right,Posterior Calf: Dry Gauze ABD pad Edema Control: Wound #12 Left,Lateral Lower Leg: Unna Boots Bilaterally - with kerlix coban overlay Wound #15 Left,Anterior Lower Leg: Unna Boots Bilaterally - with kerlix coban overlay Wound #16 Left,Distal,Anterior Lower Leg: Unna Boots Bilaterally - with kerlix coban overlay Wound #17 Right,Medial Lower Leg: Unna Boots Bilaterally - with kerlix coban overlay Wound #3R Right,Posterior Calf: Unna Boots Bilaterally - with kerlix coban overlay 1. I am still using Hydrofera Blue to all wound areas 2. Continuing to use topical antifungal on his toes which look better this week 3. We do not have good edema control on the left with pitting edema. The right looks under control. I have changed the compression to Unna boots to see if I can get some mixture of control here of the swelling without possibly excessive pressure compromising flow to his toes. I am not saying I am going to avoid 4-layer compression in the future if necessary adjust for now and trying a different approach to compression Electronic Signature(s) Signed: 09/05/2019 5:53:37 PM By: [name] MD Entered By: [name] on 09/05/2019 09:43:32 -------------------------------------------------------------------------------- SuperBill  Details Patient Name: Date of Service: [name] 09/05/2019 Medical Record [id number] Patient Account Number: [id number] Date of Birth/Sex: Treating RN: 1941/09/07 (78 y.o. [name]) Dolores Lory, [place] Primary Care Provider: Hurshel Gross Other Clinician: Referring Provider: Treating Provider/Extender:Gyselle Matthew, [name], [place] Weeks in Treatment: 27 Diagnosis Coding ICD-10 Codes Code Description E11.51 Type 2 diabetes mellitus with diabetic peripheral angiopathy without gangrene L97.211 Non-pressure chronic ulcer of right calf limited to breakdown of skin E11.42 Type 2 diabetes mellitus with diabetic polyneuropathy L97.221 Non-pressure chronic ulcer of left calf limited to breakdown of skin I87.333 Chronic venous hypertension (idiopathic) with ulcer and inflammation of bilateral lower extremity B35.3 Tinea pedis Facility Procedures The patient participates with Medicare or their insurance follows the Medicare Facility Guidelines: CPT4 Code Description Modifier Quantity [id number] - APPLY UNNA BOOT/PROFO BILATERAL 1 Physician Procedures CPT4: Description Modifier Quantity Code [id number] - WC PHYS LEVEL 3 - EST PT 1 ICD-10 Diagnosis Description L97.211 Non-pressure chronic ulcer of right calf limited to breakdown of skin I87.333 Chronic venous hypertension (idiopathic) with ulcer and  inflammation of bilateral lower extremity L97.221 Non-pressure chronic ulcer of left calf limited to breakdown of skin B35.3 Tinea pedis Electronic Signature(s) Signed: 09/05/2019 5:53:37 PM By: [name] MD Entered By: [name] on 09/05/2019 09:43:59

## 2019-09-05 NOTE — Progress Notes (Signed)
Daniel, Gross ([id number]) Visit Report for 08/08/2019 Arrival Information Details Patient Name: Date of Service: [name], [name] 08/08/2019 8:00 AM Medical Record [name] Patient Account Number: [id number] Date of Birth/Sex: Treating RN: 1941-05-28 (78 y.o. [name] Primary Care Iriel Nason: [place] Other Clinician: Referring Gonzalo Waymire: Treating Joretta Eads/Extender:Robson, Cheryl Flash, [place] Weeks in Treatment: 23 Visit Information History Since Last Visit All ordered tests and consults were completed: Yes Patient Arrived: Ambulatory Added or deleted any medications: No Arrival Time: 07:42 Any new allergies or adverse reactions: No Accompanied By: spouse Had a fall or experienced change in No Transfer Assistance: None activities of daily living that may affect Patient Identification Verified: Yes risk of falls: Secondary Verification Process Yes Signs or symptoms of abuse/neglect since last No Completed: visito Patient Requires Transmission- No Hospitalized since last visit: No Based Precautions: Implantable device outside of the clinic excluding No Patient Has Alerts: Yes cellular tissue based products placed in the center Patient Alerts: Patient on Blood since last visit: Thinner Has Dressing in Place as Prescribed: Yes Has Compression in Place as Prescribed: Yes Pain Present Now: No Electronic Signature(s) Signed: 08/08/2019 2:49:59 PM By: Baruch Gouty RN, BSN Entered By: Baruch Gouty on 08/08/2019 07:43:48 -------------------------------------------------------------------------------- Compression Therapy Details Patient Name: Date of Service: Daniel Pia D. 08/08/2019 8:00 AM Medical Record [id number] Patient Account Number: [id number] Date of Birth/Sex: Treating RN: 04-02-1941 (78 y.o. [name]) [name] Primary Care Mccrae Speciale: [place] Party Other Clinician: Referring Ariba Lehnen: Treating Jase Reep/Extender:Robson,  Cheryl Flash, [place] Weeks in Treatment: 23 Compression Therapy Performed for Wound Wound #12 Left,Lateral Ankle Assessment: Performed By: Clinician [name], RN Compression Type: Four Layer Post Procedure Diagnosis Same as Pre-procedure Electronic Signature(s) Signed: 09/05/2019 2:53:59 PM By: [name] RN Entered By: [name] on 08/08/2019 08:32:00 -------------------------------------------------------------------------------- Compression Therapy Details Patient Name: Date of Service: [name] 08/08/2019 8:00 AM Medical Record [id number] Patient Account Number: [id number] Date of Birth/Sex: Treating RN: 1941/05/16 (78 y.o. [name]) [name] Primary Care Durk Carmen: [place] Party Other Clinician: Referring Cyerra Yim: Treating Manisha Cancel/Extender:Robson, Cheryl Flash, [place] Weeks in Treatment: 23 Compression Therapy Performed for Wound Wound #3R Right,Posterior Calf Assessment: Performed By: Jake Church, RN Compression Type: Four Layer Post Procedure Diagnosis Same as Pre-procedure Electronic Signature(s) Signed: 09/05/2019 2:53:59 PM By: [name] RN Entered By: [name] on 08/08/2019 08:32:00 -------------------------------------------------------------------------------- Compression Therapy Details Patient Name: Date of Service: [name], [name] 08/08/2019 8:00 AM Medical Record [id number] Patient Account Number: [id number] Date of Birth/Sex: Treating RN: 02-09-1941 (78 y.o. [name]) [name] Primary Care Johncharles Fusselman: [place] Party Other Clinician: Referring Ayaana Biondo: Treating Mariah Gerstenberger/Extender:Robson, Cheryl Flash, [place] Weeks in Treatment: 23 Compression Therapy Performed for Wound Wound #9R Left,Distal,Lateral Lower Leg Assessment: Performed By: Clinician [name], RN Compression Type: Four Layer Post Procedure Diagnosis Same as Pre-procedure Electronic Signature(s) Signed: 09/05/2019 2:53:59 PM By:  [name] RN Entered By: [name] on 08/08/2019 08:32:00 -------------------------------------------------------------------------------- Encounter Discharge Information Details Patient Name: Date of Service: Daniel Pia D. 08/08/2019 8:00 AM Medical Record [id number] Patient Account Number: [id number] Date of Birth/Sex: Treating RN: 06-30-1941 (78 y.o. [name] Primary Care Hector Taft: [place] Party Other Clinician: Referring Gavyn Ybarra: Treating Evone Arseneau/Extender:Robson, Cheryl Flash, [place] Weeks in Treatment: 45 Encounter Discharge Information Items Discharge Condition: Stable Ambulatory Status: Ambulatory Discharge Destination: Home Transportation: Private Auto Accompanied By: wife Schedule Follow-up Appointment: Yes Clinical Summary of Care: Patient Declined Electronic Signature(s) Signed: 08/14/2019 5:59:11 PM By: Levan Hurst RN, BSN Entered By: Levan Hurst on 08/08/2019 08:54:05 -------------------------------------------------------------------------------- Lower Extremity Assessment Details Patient Name:  Date of Service: Daniel, Gross 08/08/2019 8:00 AM Medical Record [place] Patient Account Number: [id number] Date of Birth/Sex: Treating RN: 1940/10/17 (78 y.o. [name] Primary Care Ac Colan: [place] Party Other Clinician: Referring Hien Cunliffe: Treating Nivea Wojdyla/Extender:Robson, Cheryl Flash, [place] Weeks in Treatment: 23 Edema Assessment Assessed: [Left: No] [Right: No] Edema: [Left: No] [Right: No] Calf Left: Right: Point of Measurement: 31 cm From Medial Instep 31.6 cm 31.6 cm Ankle Left: Right: Point of Measurement: 11 cm From Medial Instep 19.9 cm 20.6 cm Vascular Assessment Pulses: Dorsalis Pedis Palpable: [Left:Yes] [Right:Yes] Electronic Signature(s) Signed: 08/08/2019 2:49:59 PM By: Baruch Gouty RN, BSN Entered By: Baruch Gouty on 08/08/2019  07:56:15 -------------------------------------------------------------------------------- Multi Wound Chart Details Patient Name: Date of Service: Daniel Pia D. 08/08/2019 8:00 AM Medical Record [id number] Patient Account Number: [id number] Date of Birth/Sex: Treating RN: 1940-10-27 (78 y.o. [name]) [name] Primary Care Timarion Agcaoili: [place] Party Other Clinician: Referring Habib Kise: Treating Ariah Mower/Extender:Robson, Cheryl Flash, [place] Weeks in Treatment: 23 Vital Signs Height(in): 74 Pulse(bpm): 79 Weight(lbs): 212 Blood Pressure(mmHg): 140/74 Body Mass Index(BMI): 27 Temperature(F): 97.8 Respiratory 18 Rate(breaths/min): Photos: [12:No Photos] [3R:No Photos] [9R:No Photos] Wound Location: [12:Left Ankle - Lateral] [3R:Right Calf - Posterior] [9R:Left Lower Leg - Lateral, Distal] Wounding Event: [12:Blister] [3R:Gradually Appeared] [9R:Not Known] Primary Etiology: [12:Venous Leg Ulcer] [3R:Diabetic Wound/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] Comorbid History: [12:Cataracts, Hypertension, Cataracts, Hypertension, Cataracts, Hypertension, Peripheral Venous Disease, Peripheral Venous Disease, Peripheral Venous Disease, Type II Diabetes, Gout, Received Radiation] [3R:Type II Diabetes, Gout,  Received Radiation] [9R:Type II Diabetes, Gout, Received Radiation] Date Acquired: [12:08/06/2019] [3R:02/28/2019] [9R:05/17/2019] Weeks of Treatment: [12:0] [3R:23] [9R:11] Wound Status: [12:Open] [3R:Open] [9R:Open] Wound Recurrence: [12:No] [3R:Yes] [9R:Yes] Measurements L x W x D 0.2x0.5x0.1 [3R:2.1x2.1x0.1] [9R:1x0.9x0.1] (cm) Area (cm) : [12:0.079] [3R:3.464] [9F:7.902] Volume (cm) : [12:0.008] [3R:0.346] [9R:0.071] % Reduction in Area: [12:N/A] [3R:85.90%] [9R:92.00%] % Reduction in Volume: [12:N/A] [3R:85.90%] [9R:91.90%] Classification: [12:Full Thickness Without Exposed Support Structures] [3R:Grade 2] [9R:Full Thickness Without Exposed Support  Structures] Exudate Amount: [12:Medium] [3R:Small] [9R:None Present] Exudate Type: [12:Serosanguineous] [3R:Serosanguineous] [9R:N/A] Exudate Color: [12:red, brown] [3R:red, brown] [9R:N/A] Wound Margin: [12:N/A] [3R:Flat and Intact] [9R:Flat and Intact] Granulation Amount: [12:None Present (0%)] [3R:Large (67-100%)] [9R:Large (67-100%)] Granulation Quality: [12:N/A] [3R:Red] [9R:Pink] Necrotic Amount: [12:None Present (0%)] [3R:None Present (0%)] [9R:None Present (0%)] Exposed Structures: [12:Fascia: No Fat Layer (Subcutaneous Tissue) Exposed: Yes Tissue) Exposed: No Tendon: No Muscle: No Joint: No Bone: No] [3R:Fat Layer (Subcutaneous Fascia: No Tendon: No Muscle: No Joint: No Bone: No] [9R:Fascia: No Fat Layer (Subcutaneous Tissue)  Exposed: No Tendon: No Muscle: No Joint: No Bone: No] Epithelialization: [12:None Compression Therapy] [3R:Small (1-33%) Compression Therapy] [9R:Large (67-100%) Compression Therapy] Treatment Notes Electronic Signature(s) Signed: 08/08/2019 5:17:49 PM By: [name] MD Signed: 09/05/2019 2:53:59 PM By: [name] RN Entered By: [name] on 08/08/2019 08:34:48 -------------------------------------------------------------------------------- Multi-Disciplinary Care Plan Details Patient Name: Date of Service: Daniel Pia D. 08/08/2019 8:00 AM Medical Record [id number] Patient Account Number: [id number] Date of Birth/Sex: Treating RN: 02-11-1941 (78 y.o. [name] Primary Care Ayana Imhof: [place] Party Other Clinician: Referring Ronalda Walpole: Treating Marianna Cid/Extender:Robson, Cheryl Flash, [place] Weeks in Treatment: 23 Active Inactive Wound/Skin Impairment Nursing Diagnoses: Knowledge deficit related to ulceration/compromised skin integrity Goals: Patient/caregiver will verbalize understanding of skin care regimen Date Initiated: 02/28/2019 Target Resolution Date: 09/01/2019 Goal Status: Active Ulcer/skin breakdown will have a  volume reduction of 30% by week 4 Date Initiated: 02/28/2019 Date Inactivated: 04/04/2019 Target Resolution Date: 03/31/2019 Goal Status: Met Ulcer/skin breakdown will have a volume reduction of 50%  by week 8 Date Initiated: 04/04/2019 Date Inactivated: 05/09/2019 Target Resolution Date: 05/05/2019 Goal Status: Met Ulcer/skin breakdown will have a volume reduction of 80% by week 12 Date Initiated: 05/09/2019 Date Inactivated: 06/13/2019 Target Resolution Date: 06/09/2019 Unmet Goal Status: Unmet Reason: comorbities/new wounds Ulcer/skin breakdown will heal within 14 weeks Date Initiated: 06/13/2019 Date Inactivated: 07/11/2019 Target Resolution Date: 07/07/2019 Unmet Goal Status: Unmet Reason: comorbityies Interventions: Assess patient/caregiver ability to obtain necessary supplies Assess patient/caregiver ability to perform ulcer/skin care regimen upon admission and as needed Assess ulceration(s) every visit Notes: Electronic Signature(s) Signed: 09/05/2019 2:53:59 PM By: [name] RN Entered By: [name] on 08/08/2019 07:46:27 -------------------------------------------------------------------------------- Pain Assessment Details Patient Name: Date of Service: ADONUS, USELMAN 08/08/2019 8:00 AM Medical Record [id number] Patient Account Number: [id number] Date of Birth/Sex: Treating RN: 1940-11-17 (78 y.o. [name] Primary Care Tia Gelb: [place] Party Other Clinician: Referring Reet Scharrer: Treating Simara Rhyner/Extender:Robson, Cheryl Flash, [place] Weeks in Treatment: 23 Active Problems Location of Pain Severity and Description of Pain Patient Has Paino No Site Locations Rate the pain. Current Pain Level: 0 Pain Management and Medication Current Pain Management: Electronic Signature(s) Signed: 08/08/2019 2:49:59 PM By: Baruch Gouty RN, BSN Entered By: Baruch Gouty on 08/08/2019  07:44:11 -------------------------------------------------------------------------------- Patient/Caregiver Education Details Patient Name: Date of Service: MELQUIADES, KOVAR 11/10/2020andnbsp8:00 AM Medical Record [id number] Patient Account Number: [id number] Date of Birth/Gender: Treating RN: 19-Aug-1941 (78 y.o. [name]) Dolores Lory, Morey Hummingbird Primary Care Physician: [place] Party Other Clinician: Referring Physician: Treating Physician/Extender:Robson, Cheryl Flash, [facility] [place] Weeks in Treatment: 62 Education Assessment Education Provided To: Patient Education Topics Provided Electronic Signature(s) Signed: 09/05/2019 2:53:59 PM By: [name] RN Entered By: [name] on 08/08/2019 07:46:37 -------------------------------------------------------------------------------- Wound Assessment Details Patient Name: Date of Service: SALOME, COZBY 08/08/2019 8:00 AM Medical Record [id number] Patient Account Number: [id number] Date of Birth/Sex: Treating RN: 02/03/1941 (78 y.o. [name]) [name] Primary Care Dara Camargo: [place]hel Party Other Clinician: Referring Tiegan Jambor: Treating Iliyah Bui/Extender:Robson, Cheryl Flash, [place] Weeks in Treatment: 23 Wound Status Wound Number: 12 Primary Venous Leg Ulcer Etiology: Wound Location: Left, Lateral Ankle Wound Open Wounding Event: Blister Status: Date Acquired: 08/06/2019 Comorbid Cataracts, Hypertension, Peripheral Venous Weeks Of Treatment: 0 History: Disease, Type II Diabetes, Gout, Received Clustered Wound: No Radiation Wound Measurements Length: (cm) 1 % Reduct Width: (cm) 0.9 % Reduct Depth: (cm) 0.1 Epitheli Area: (cm) 0.707 Tunneli Volume: (cm) 0.071 Undermi Wound Description Full Thickness Without Exposed Support Foul Od Classification: Structures Slough/ Exudate Medium Amount: Exudate Serosanguineous Type: Exudate red, brown Color: Wound Bed Granulation Amount: None Present (0%) Necrotic Amount: None  Present (0%) Fascia Fat Lay Tendon Muscle Joint E Bone Ex or After Cleansing: No Fibrino No Exposed Structure Exposed: No er (Subcutaneous Tissue) Exposed: No Exposed: No Exposed: No xposed: No posed: No ion in Area: -794.9% ion in Volume: -787.5% alization: None ng: No ning: No Electronic Signature(s) Signed: 09/05/2019 2:53:59 PM By: [name] RN Entered By: [name] on 08/08/2019 08:43:42 -------------------------------------------------------------------------------- Wound Assessment Details Patient Name: Date of Service: CORDALE, MANERA 08/08/2019 8:00 AM Medical Record [id number] Patient Account Number: [id number] Date of Birth/Sex: Treating RN: [birth year] (78 y.o. [name] Primary Care Quanasia Defino: [place] Party Other Clinician: Referring Angle Karel: Treating Xadrian Craighead/Extender:Robson, Cheryl Flash, [place] Weeks in Treatment: 23 Wound Status Wound Number: [id number] Primary Diabetic Wound/Ulcer of the Lower Extremity Etiology: Wound Location: Right Calf - Posterior Wound Open Wounding Event: Gradually Appeared Status: Date Acquired: 02/28/2019 Comorbid Cataracts, Hypertension, Peripheral Venous Weeks Of Treatment: 23 History: Disease, Type II Diabetes, Gout, Received  Clustered Wound: No Radiation Photos Wound Measurements Length: (cm) 2.1 % Reduct Width: (cm) 2.1 % Reduct Depth: (cm) 0.1 Epitheli Area: (cm) 3.464 Tunneli Volume: (cm) 0.346 Undermi Wound Description Classification: Grade 2 Wound Margin: Flat and Intact Exudate Amount: Small Exudate Type: Serosanguineous Exudate Color: red, brown Wound Bed Granulation Amount: Large (67-100%) Granulation Quality: Red Necrotic Amount: None Present (0%) Foul Odor After Cleansing: No Slough/Fibrino No Exposed Structure Fascia Exposed: No Fat Layer (Subcutaneous Tissue) Exposed: Yes Tendon Exposed: No Muscle Exposed: No Joint Exposed: No Bone Exposed: No ion in Area: 85.9% ion  in Volume: 85.9% alization: Small (1-33%) ng: No ning: No Electronic Signature(s) Signed: 08/09/2019 4:27:20 PM By: Mikeal Hawthorne EMT/HBOT Signed: 08/09/2019 5:49:26 PM By: Baruch Gouty RN, BSN Previous Signature: 08/08/2019 2:49:59 PM Version By: Baruch Gouty RN, BSN Entered By: Mikeal Hawthorne on 08/09/2019 14:06:23 -------------------------------------------------------------------------------- Wound Assessment Details Patient Name: Date of Service: Daniel Pia D. 08/08/2019 8:00 AM Medical Record [id number] Patient Account Number: [id number] Date of Birth/Sex: Treating RN: 1940/11/30 (78 y.o. [name]) Dolores Lory, [place] Primary Care Myrta Mercer: [place]hel Party Other Clinician: Referring Marya Lowden: Treating Ermine Stebbins/Extender:Robson, Cheryl Flash, [place] Weeks in Treatment: 23 Wound Status Wound Number: [id number] Primary Venous Leg Ulcer Etiology: Wound Location: Left, Distal, Lateral Lower Leg Wound Open Wounding Event: Not Known Wounding Event: Not Known Status: Date Acquired: 05/17/2019 Comorbid Cataracts, Hypertension, Peripheral Venous Weeks Of Treatment: 11 History: Disease, Type II Diabetes, Gout, Received Clustered Wound: No Radiation Wound Measurements Length: (cm) 0.2 % Reduct Width: (cm) 0.5 % Reduct Depth: (cm) 0.1 Epitheli Area: (cm) 0.079 Tunneli Volume: (cm) 0.008 Undermi Wound Description Classification: Full Thickness Without Exposed Support Foul Od Structures Slough/ Wound Flat and Intact Margin: Exudate None Present Amount: Wound Bed Granulation Amount: Large (67-100%) Granulation Quality: Pink Fascia Necrotic Amount: None Present (0%) Fat Lay Tendon Muscle Joint E Bone Ex or After Cleansing: No Fibrino No Exposed Structure Exposed: No er (Subcutaneous Tissue) Exposed: No Exposed: No Exposed: No xposed: No posed: No ion in Area: 99.1% ion in Volume: 99.1% alization: Large (67-100%) ng: No ning: No Electronic Signature(s) Signed:  09/05/2019 2:53:59 PM By: [name] RN Entered By: [name] on 08/08/2019 08:43:42 -------------------------------------------------------------------------------- Vitals Details Patient Name: Date of Service: Daniel Pia D. 08/08/2019 8:00 AM Medical Record [id number] Patient Account Number: [id number] Date of Birth/Sex: Treating RN: [birth year] (78 y.o. [name] Primary Care Chasyn Cinque: [place] Party Other Clinician: Referring Skyy Nilan: Treating Kingston Shawgo/Extender:Robson, Cheryl Flash, [place] Weeks in Treatment: 23 Vital Signs Time Taken: 08:02 Temperature (F): 97.8 Height (in): 74 Pulse (bpm): 79 Source: Stated Respiratory Rate (breaths/min): 18 Weight (lbs): 212 Blood Pressure (mmHg): 140/74 Source: Stated Reference Range: 80 - 120 mg / dl Body Mass Index (BMI): 27.2 Electronic Signature(s) Signed: 08/08/2019 2:49:59 PM By: Baruch Gouty RN, BSN Entered By: Baruch Gouty on 08/08/2019 08:05:04

## 2019-09-06 NOTE — Progress Notes (Signed)
CORNELIOUS, [name] ([id number]) Visit Report for 09/05/2019 Arrival Information Details Patient Name: Date of Service: [name], [name] 09/05/2019 8:00 AM Medical Record [place] Patient Account Number: [id number] Date of Birth/Sex: Treating RN: 12/05/1940 (78 y.o. [name] Primary Care Braven Wolk: [place] Party Other Clinician: Referring Chiamaka Latka: Treating Avaneesh Pepitone/Extender:Robson, Cheryl Flash, [place] Weeks in Treatment: 52 Visit Information History Since Last Visit Added or deleted any medications: No Patient Arrived: Ambulatory Any new allergies or adverse reactions: No Arrival Time: 08:29 Had a fall or experienced change in No Accompanied By: wife activities of daily living that may affect Transfer Assistance: None risk of falls: Patient Identification Verified: Yes Signs or symptoms of abuse/neglect since last No Secondary Verification Process Yes visito Completed: Hospitalized since last visit: No Patient Requires Transmission- No Implantable device outside of the clinic excluding No Based Precautions: cellular tissue based products placed in the center Patient Has Alerts: Yes since last visit: Patient Alerts: Patient on Blood Has Dressing in Place as Prescribed: Yes Thinner Has Compression in Place as Prescribed: Yes Pain Present Now: No Electronic Signature(s) Signed: 09/06/2019 12:04:56 PM By: Kela Millin Entered By: Kela Millin on 09/05/2019 08:30:04 -------------------------------------------------------------------------------- Compression Therapy Details Patient Name: Date of Service: Daniel Pia D. 09/05/2019 8:00 AM Medical Record [id number] Patient Account Number: [id number] Date of Birth/Sex: Treating RN: 29-Jul-1941 (78 y.o. [name]) [name] Primary Care Audryna Wendt: [place] Party Other Clinician: Referring Rye Dorado: Treating Aubrie Lucien/Extender:Robson, Cheryl Flash, [place] Weeks in Treatment:  27 Compression Therapy Performed for Wound Wound #12 Left,Lateral Lower Leg Assessment: Performed By: Jake Church, RN Compression Type: Rolena Infante Post Procedure Diagnosis Same as Pre-procedure Electronic Signature(s) Signed: 09/05/2019 2:52:36 PM By: [name] RN Entered By: [name] on 09/05/2019 09:37:30 -------------------------------------------------------------------------------- Compression Therapy Details Patient Name: Date of Service: [name], [name] 09/05/2019 8:00 AM Medical Record [id number] Patient Account Number: [id number] Date of Birth/Sex: Treating RN: 1941-01-06 (78 y.o. [name]) [name] Primary Care Brycin Kille: [place] Party Other Clinician: Referring Maccoy Haubner: Treating Afifa Truax/Extender:Robson, Cheryl Flash, [place] Weeks in Treatment: 27 Compression Therapy Performed for Wound Wound #15 Left,Anterior Lower Leg Assessment: Performed By: Jake Church, RN Compression Type: Rolena Infante Post Procedure Diagnosis Same as Pre-procedure Electronic Signature(s) Signed: 09/05/2019 2:52:36 PM By: [name] RN Entered By: [name] on 09/05/2019 09:37:30 -------------------------------------------------------------------------------- Compression Therapy Details Patient Name: Date of Service: [name], [name] 09/05/2019 8:00 AM Medical Record [id number] Patient Account Number: [id number] Date of Birth/Sex: Treating RN: 05-Jul-1941 (78 y.o. [name]) [name] Primary Care Cheray Pardi: [place] Party Other Clinician: Referring Laneya Gasaway: Treating Kelvyn Schunk/Extender:Robson, Cheryl Flash, [place] Weeks in Treatment: 27 Compression Therapy Performed for Wound Wound #16 Left,Distal,Anterior Lower Leg Assessment: Performed By: Jake Church, RN Compression Type: Rolena Infante Post Procedure Diagnosis Same as Pre-procedure Electronic Signature(s) Signed: 09/05/2019 2:52:36 PM By: [name] RN Entered By: [name] on  09/05/2019 09:37:31 -------------------------------------------------------------------------------- Compression Therapy Details Patient Name: Date of Service: [name], [name] 09/05/2019 8:00 AM Medical Record [id number] Patient Account Number: [id number] Date of Birth/Sex: Treating RN: Oct 18, 1940 (78 y.o. [name]) [name] Primary Care Huberta Tompkins: H[place]hel Party Other Clinician: Referring Porshe Fleagle: Treating Romelle Muldoon/Extender:Robson, Cheryl Flash, [place] Weeks in Treatment: 27 Compression Therapy Performed for Wound Wound #17 Right,Medial Lower Leg Assessment: Performed By: Jake Church, RN Compression Type: Rolena Infante Post Procedure Diagnosis Same as Pre-procedure Electronic Signature(s) Signed: 09/05/2019 2:52:36 PM By: [name] RN Entered By: [name] on 09/05/2019 09:37:31 -------------------------------------------------------------------------------- Compression Therapy Details Patient Name: Date of Service: [name], [name] 09/05/2019 8:00 AM Medical Record [id number] Patient  Account Number: [id number] Date of Birth/Sex: Treating RN: 09-May-1941 (78 y.o. [name]) [name] Primary Care Clemon Devaul: H[place]hel Party Other Clinician: Referring Suella Cogar: Treating Lowell Makara/Extender:Robson, Cheryl Flash, [place] Weeks in Treatment: 27 Compression Therapy Performed for Wound Wound #18 Left,Medial Foot Assessment: Performed By: Jake Church, RN Compression Type: Rolena Infante Post Procedure Diagnosis Same as Pre-procedure Electronic Signature(s) Signed: 09/05/2019 2:52:36 PM By: [name] RN Entered By: [name] on 09/05/2019 09:37:31 -------------------------------------------------------------------------------- Compression Therapy Details Patient Name: Date of Service: [name], [name] 09/05/2019 8:00 AM Medical Record [id number] Patient Account Number: [id number] Date of Birth/Sex: Treating RN: Jul 21, 1941 (78 y.o. [name]) [name] Primary Care Masiah Lewing: H[place]hel Party Other Clinician: Referring Ahtziry Saathoff: Treating Faythe Heitzenrater/Extender:Robson, Cheryl Flash, [place] Weeks in Treatment: 27 Compression Therapy Performed for Wound Wound #3R Right,Posterior Calf Assessment: Performed By: Jake Church, RN Compression Type: Rolena Infante Post Procedure Diagnosis Same as Pre-procedure Electronic Signature(s) Signed: 09/05/2019 2:52:36 PM By: [name] RN Entered By: [name] on 09/05/2019 09:37:31 -------------------------------------------------------------------------------- Encounter Discharge Information Details Patient Name: Date of Service: Daniel Pia D. 09/05/2019 8:00 AM Medical Record [id number] Patient Account Number: [id number] Date of Birth/Sex: Treating RN: Feb 08, 1941 (78 y.o. [name] Primary Care Prudie Guthridge: [place] Party Other Clinician: Referring Kynlee Koenigsberg: Treating [place]iz[place]/Extender:Robson, Cheryl Flash, [place] Weeks in Treatment: 67 Encounter Discharge Information Items Discharge Condition: Stable Ambulatory Status: Ambulatory Discharge Destination: Home Transportation: Private Auto Accompanied By: wife Schedule Follow-up Appointment: Yes Clinical Summary of Care: Patient Declined Electronic Signature(s) Signed: 09/06/2019 12:04:56 PM By: Kela Millin Entered By: Kela Millin on 09/05/2019 10:21:05 -------------------------------------------------------------------------------- Lower Extremity Assessment Details Patient Name: Date of Service: MAXEY, RANSOM 09/05/2019 8:00 AM Medical Record [id number] Patient Account Number: [id number] Date of Birth/Sex: Treating RN: Mar 05, 1941 (78 y.o. [name] Primary Care Elize Pinon: Hurshel Party Other Clinician: Referring Yari Szeliga: Treating Deandrea Rion/Extender:Robson, Cheryl Flash, [place] Weeks in Treatment: 27 Edema Assessment Assessed: [Left: No] [Right: No] Edema: [Left:  Yes] [Right: Yes] Calf Left: Right: Point of Measurement: 31 cm From Medial Instep 34 cm 32 cm Ankle Left: Right: Point of Measurement: 11 cm From Medial Instep 19.8 cm 21 cm Vascular Assessment Pulses: Dorsalis Pedis Palpable: [Left:Yes] [Right:Yes] Electronic Signature(s) Signed: 09/06/2019 12:04:56 PM By: Kela Millin Entered By: Kela Millin on 09/05/2019 08:31:41 -------------------------------------------------------------------------------- Multi Wound Chart Details Patient Name: Date of Service: Daniel Pia D. 09/05/2019 8:00 AM Medical Record [id number] Patient Account Number: [id number] Date of Birth/Sex: Treating RN: August 09, 1941 (78 y.o. [name]) [name] Primary Care Zadrian Mccauley: Hurshel Party Other Clinician: Referring Baron Parmelee: Treating Raynaldo Falco/Extender:Robson, Cheryl Flash, [place] Weeks in Treatment: 27 Vital Signs Height(in): 74 Pulse(bpm): 85 Weight(lbs): 212 Blood Pressure(mmHg): 126/79 Body Mass Index(BMI): 27 Temperature(F): 98.1 Respiratory 20 Rate(breaths/min): Photos: [12:No Photos] [13:No Photos] [14:No Photos] Wound Location: [12:Left Lower Leg - Lateral] [13:Left Toe Second] [14:Left Toe Third] Wounding Event: [12:Blister] [13:Gradually Appeared] [14:Gradually Appeared] Primary Etiology: [12:Venous Leg Ulcer] [13:Diabetic Wound/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] [14:Lower Extremity] Comorbid History: [12:Cataracts, Hypertension, Cataracts, Hypertension, Cataracts, Hypertension, Peripheral Venous Disease, Peripheral Venous Disease, Peripheral Venous Disease, Type II Diabetes, Gout, Received Radiation] [13:Type II Diabetes, Gout,  Received Radiation] [14:Type II Diabetes, Gout, Received Radiation] Date Acquired: [12:08/06/2019] [13:08/22/2019] [14:08/22/2019] Weeks of Treatment: [12:4] [13:2] [14:2] Wound Status: [12:Open] [13:Open] [14:Open] Wound Recurrence: [12:No] [13:No] [14:No] Measurements L x W x D  0.2x0.2x0.1 [13:0.5x0.7x0.1] [14:0.2x0.2x0.1] (cm) Area (cm) : [12:0.031] [13:0.275] [14:0.031] Volume (cm) : [12:0.003] [13:0.027] [14:0.003] % Reduction in Area: [12:95.60%] [13:91.10%] [14:97.80%] % Reduction in Volume: 95.80% [13:91.20%] [14:97.90%] Classification: [12:Full Thickness Without  Exposed Support Structures] [13:Grade 1] [14:Grade 1] Exudate Amount: [12:Small] [13:Medium] [14:Medium] Exudate Type: [12:Serous] [13:Serosanguineous] [14:Serosanguineous] Exudate Color: [12:amber] [13:red, brown] [14:red, brown] Wound Margin: [12:N/A] [13:Distinct, outline attached Distinct, outline attached] Granulation Amount: [12:Large (67-100%)] [13:Large (67-100%)] [14:Large (67-100%)] Granulation Quality: [12:Red, Pink, Pale] [13:Red, Pink] [14:Red, Pink] Necrotic Amount: [12:None Present (0%)] [13:None Present (0%)] [14:None Present (0%)] Exposed Structures: [12:Fat Layer (Subcutaneous Fat Layer (Subcutaneous Fascia: No Tissue) Exposed: Yes Fascia: No Tendon: No Muscle: No Joint: No Bone: No] [13:Tissue) Exposed: Yes Fascia: No Tendon: No Muscle: No Joint: No Bone: No] [14:Fat Layer (Subcutaneous Tissue)  Exposed: No Tendon: No Muscle: No Joint: No Bone: No] Epithelialization: [12:None 15] [13:Small (1-33%) 16] [14:Small (1-33%) 17] Photos: [12:No Photos] [13:No Photos] [14:No Photos] Wound Location: [12:Left Lower Leg - Anterior] [13:Left Lower Leg - Anterior, Right Lower Leg - Posterior Distal] Wounding Event: [12:Blister] [13:Blister] [14:Blister] Primary Etiology: [12:Diabetic Wound/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] [14:Venous Leg Ulcer] Comorbid History: [12:Cataracts, Hypertension, Cataracts, Hypertension, Cataracts, Hypertension, Peripheral Venous Disease, Peripheral Venous Disease, Peripheral Venous Disease, Type II Diabetes, Gout, Received Radiation] [13:Type II Diabetes, Gout,  Received Radiation] [14:Type II Diabetes, Gout, Received Radiation] Date Acquired:  [12:08/29/2019] [13:09/05/2019] [14:09/05/2019] Weeks of Treatment: [12:1] [13:0] [14:0] Wound Status: [12:Open] [13:Open] [14:Open] Wound Recurrence: [12:No] [13:No] [14:No] Measurements L x W x D 3.2x5.6x0.2 [13:0.6x0.5x0.1] [14:1.1x1.5x0.1] (cm) Area (cm) : [12:14.074] [13:0.236] [14:1.296] Volume (cm) : [12:2.815] [13:0.024] [14:0.13] % Reduction in Area: [12:-70.70%] [13:N/A] [14:N/A] % Reduction in Volume: -241.20% [13:N/A] [14:N/A] Classification: [12:Grade 2] [13:Full Thickness Without Exposed Support Structures Exposed Support Structures] [14:Full Thickness Without] Exudate Amount: [12:Medium] [13:Medium] [14:Medium] Exudate Type: [12:Serous] [13:Serous] [14:Serous] Exudate Color: [12:amber] [13:amber] [14:amber] Wound Margin: [12:N/A] [13:Distinct, outline attached Distinct, outline attached] Granulation Amount: [12:Medium (34-66%)] [13:Medium (34-66%)] [14:Large (67-100%)] Granulation Quality: [12:Red] [13:N/A] [14:Pink] Necrotic Amount: [12:Medium (34-66%)] [13:Medium (34-66%)] [14:Small (1-33%)] Exposed Structures: [12:Fat Layer (Subcutaneous Tissue) Exposed: Yes Fascia: No Tendon: No Muscle: No Joint: No Bone: No] [13:Fat Layer (Subcutaneous Fat Layer (Subcutaneous Tissue) Exposed: Yes Fascia: No Tendon: No Muscle: No Joint: No Bone: No] [14:Tissue) Exposed: Yes  Fascia: No Tendon: No Muscle: No Joint: No Bone: No] Epithelialization: [12:None 18] [13:None 3R] [14:None N/A] Photos: [12:No Photos] [13:No Photos] [14:N/A] Wound Location: [12:Left Foot - Medial] [13:Right Calf - Posterior] [14:N/A] Wounding Event: [12:Gradually Appeared] [13:Gradually Appeared] [14:N/A] Primary Etiology: [12:Diabetic Wound/Ulcer of the Diabetic Wound/Ulcer of the N/A Lower Extremity] [13:Lower Extremity] Comorbid History: [12:Cataracts, Hypertension, Cataracts, Hypertension, N/A Peripheral Venous Disease, Peripheral Venous Disease, Type II Diabetes, Gout, Received Radiation] [13:Type II Diabetes,  Gout, Received Radiation] Date Acquired: [12:09/03/2019] [13:02/28/2019] [14:N/A] Weeks of Treatment: [12:0] [13:27] [14:N/A] Wound Status: [12:Open] [13:Open] [14:N/A] Wound Recurrence: [12:No] [13:Yes] [14:N/A] Measurements L x W x D 1x1.3x0.1 [13:2.1x1.9x0.1] [14:N/A] (cm) Area (cm) : [12:1.021] [13:3.134] [14:N/A] Volume (cm) : [12:0.102] [13:0.313] [14:N/A] % Reduction in Area: [12:N/A] [13:87.20%] [14:N/A] % Reduction in Volume: N/A [13:87.20%] [14:N/A] Classification: [12:Grade 2] [13:Grade 2] [14:N/A] Exudate Amount: [12:Medium] [13:Medium] [14:N/A] Exudate Type: [12:Serosanguineous] [13:Serous] [14:N/A] Exudate Color: [12:red, brown] [13:amber] [14:N/A] Wound Margin: [12:Flat and Intact] [13:Distinct, outline attached N/A] Granulation Amount: [12:None Present (0%)] [13:Medium (34-66%)] [14:N/A] Granulation Quality: [12:N/A] [13:Red, Pink, Pale] [14:N/A] Necrotic Amount: [12:None Present (0%)] [13:Medium (34-66%)] [14:N/A] Exposed Structures: [12:Fascia: No Fat Layer (Subcutaneous Tissue) Exposed: Yes Tissue) Exposed: No Tendon: No Muscle: No Joint: No Bone: No None] [13:Fat Layer (Subcutaneous N/A Fascia: No Tendon: No Muscle: No Joint: No Bone: No None] [14:N/A] Treatment Notes Electronic Signature(s) Signed: 09/05/2019 2:52:36 PM By:  [name] RN Signed: 09/05/2019 5:53:37 PM By: [name] MD Entered By: [name] on 09/05/2019 09:36:35 -------------------------------------------------------------------------------- Multi-Disciplinary Care Plan Details Patient Name: Date of Service: Daniel Pia D. 09/05/2019 8:00 AM Medical Record [id number] Patient Account Number: [id number] Date of Birth/Sex: Treating RN: [birth year] (78 y.o. [name]) [name] Primary Care Gurman Ashland: Hurshel Party Other Clinician: Referring Arella Blinder: Treating Akisha Sturgill/Extender:Robson, Cheryl Flash, [place] Weeks in Treatment: 27 Active Inactive Wound/Skin Impairment Nursing  Diagnoses: Knowledge deficit related to ulceration/compromised skin integrity Goals: Patient/caregiver will verbalize understanding of skin care regimen Date Initiated: 02/28/2019 Target Resolution Date: 09/29/2019 Goal Status: Active Ulcer/skin breakdown will have a volume reduction of 30% by week 4 Date Initiated: 02/28/2019 Date Inactivated: 04/04/2019 Target Resolution Date: 03/31/2019 Goal Status: Met Ulcer/skin breakdown will have a volume reduction of 50% by week 8 Date Initiated: 04/04/2019 Date Inactivated: 05/09/2019 Target Resolution Date: 05/05/2019 Goal Status: Met Ulcer/skin breakdown will have a volume reduction of 80% by week 12 Date Initiated: 05/09/2019 Date Inactivated: 06/13/2019 Target Resolution Date: 06/09/2019 Unmet Goal Status: Unmet Reason: comorbities/new wounds Ulcer/skin breakdown will heal within 14 weeks Date Initiated: 06/13/2019 Date Inactivated: 07/11/2019 Target Resolution Date: 07/07/2019 Unmet Goal Status: Unmet Reason: comorbityies Interventions: Assess patient/caregiver ability to obtain necessary supplies Assess patient/caregiver ability to perform ulcer/skin care regimen upon admission and as needed Assess ulceration(s) every visit Notes: Electronic Signature(s) Signed: 09/05/2019 2:52:36 PM By: [name] RN Entered By: [name] on 09/05/2019 08:22:59 -------------------------------------------------------------------------------- Pain Assessment Details Patient Name: Date of Service: AZLAN, HANWAY 09/05/2019 8:00 AM Medical Record [id number] Patient Account Number: [id number] Date of Birth/Sex: Treating RN: [birth year] (78 y.o. [name] Primary Care Khalif Stender: Hurshel Party Other Clinician: Referring Laelia Angelo: Treating Georgianna Band/Extender:Robson, Cheryl Flash, [place] Weeks in Treatment: 27 Active Problems Location of Pain Severity and Description of Pain Patient Has Paino No Site Locations Pain Management and  Medication Current Pain Management: Electronic Signature(s) Signed: 09/06/2019 12:04:56 PM By: Kela Millin Entered By: Kela Millin on 09/05/2019 08:30:36 -------------------------------------------------------------------------------- Patient/Caregiver Education Details Patient Name: Date of Service: [place], Ying D. 12/8/2020andnbsp8:00 AM Medical Record [id number] Patient Account Number: [id number] Date of Birth/Gender: Treating RN: [birth year] (78 y.o. [name]) [name] Primary Care Physician: Hurshel Party Other Clinician: Referring Physician: Treating Physician/Extender:Robson, Cheryl Flash, [facility] Weeks in Treatment: 54 Education Assessment Education Provided To: Patient Education Topics Provided Wound/Skin Impairment: Methods: Explain/Verbal Responses: State content correctly Electronic Signature(s) Signed: 09/05/2019 2:52:36 PM By: [name] RN Entered By: [name] on 09/05/2019 08:23:23 -------------------------------------------------------------------------------- Wound Assessment Details Patient Name: Date of Service: Daniel Pia D. 09/05/2019 8:00 AM Medical Record [id number] Patient Account Number: [id number] Date of Birth/Sex: Treating RN: September 14, 1941 (78 y.o. [name] Primary Care Disney Ruggiero: Hurshel Party Other Clinician: Referring Rolene Andrades: Treating Chirsty Armist[place]/Extender:Robson, Cheryl Flash, [place] Weeks in Treatment: 27 Wound Status Wound Number: 12 Primary Venous Leg Ulcer Etiology: Wound Location: Left Lower Leg - Lateral Wound Open Wounding Event: Blister Status: Date Acquired: 08/06/2019 Comorbid Cataracts, Hypertension, Peripheral Venous Weeks Of Treatment: 4 History: Disease, Type II Diabetes, Gout, Received Clustered Wound: No Radiation Photos Wound Measurements Length: (cm) 0.2 % Reduct Width: (cm) 0.2 % Reduct Depth: (cm) 0.1 Epitheli Area: (cm) 0.031 Tunneli Volume: (cm) 0.003 Wound  Description Full Thickness Without Exposed Support Foul Odo Classification: Structures Slough/F Exudate Exudate Small Amount: Exudate Serous Type: Exudate amber Color: Wound Bed Granulation Amount: Large (67-100%) Granulation Quality: Red, Pink, Pale Fascia Expos Necrotic Amount: None Present (0%) Fat Layer (S Tendon Expos Muscle Expos Joint Expose Bone Exposed r After Cleansing: No  ibrino No Exposed Structure ed: No ubcutaneous Tissue) Exposed: Yes ed: No ed: No d: No : No ion in Area: 95.6% ion in Volume: 95.8% alization: None ng: No Treatment Notes Wound #12 (Left, Lateral Lower Leg) 1. Cleanse With Wound Cleanser Soap and water 3. Primary Dressing Applied Hydrofera Blue 4. Secondary Dressing ABD Pad 6. Support Layer Applied Kerlix/[employer] Notes ketoconozole to [employer]) Signed: 09/06/2019 11:25:39 AM By: Mikeal Hawthorne EMT/HBOT Signed: 09/06/2019 12:04:56 PM By: Kela Millin Entered By: Mikeal Hawthorne on 09/06/2019 10:09:07 -------------------------------------------------------------------------------- Wound Assessment Details Patient Name: Date of Service: ZHION, PEVEHOUSE D. 09/05/2019 8:00 AM Medical Record [id number] Patient Account Number: [id number] Date of Birth/Sex: Treating RN: [birth year] (78 y.o. [name] Primary Care Kyannah Climer: Hurshel Party Other Clinician: Referring Charley Lafrance: Treating Kinnick Maus/Extender:Robson, Cheryl Flash, [place] Weeks in Treatment: 27 Wound Status Wound Number: 13 Primary Diabetic Wound/Ulcer of the Lower Extremity Etiology: Wound Location: Left Toe Second Wound Open Wounding Event: Gradually Appeared Status: Date Acquired: 08/22/2019 Comorbid Cataracts, Hypertension, Peripheral Venous Weeks Of Treatment: 2 History: Disease, Type II Diabetes, Gout, Received Clustered Wound: No Clustered Wound: No Radiation Photos Wound Measurements Length: (cm) 0.5 %  Reducti Width: (cm) 0.7 % Reducti Depth: (cm) 0.1 Epithelia Area: (cm) 0.275 Tunnelin Volume: (cm) 0.027 Undermin Wound Description Classification: Grade 1 Wound Margin: Distinct, outline attached Exudate Amount: Medium Exudate Type: Serosanguineous Exudate Color: red, brown Wound Bed Granulation Amount: Large (67-100%) Granulation Quality: Red, Pink Necrotic Amount: None Present (0%) Foul Odor After Cleansing: No Slough/Fibrino No Exposed Structure Fascia Exposed: No Fat Layer (Subcutaneous Tissue) Exposed: Yes Tendon Exposed: No Muscle Exposed: No Joint Exposed: No Bone Exposed: No on in Area: 91.1% on in Volume: 91.2% lization: Small (1-33%) g: No ing: No Treatment Notes Wound #13 (Left Toe Second) 1. Cleanse With Wound Cleanser Soap and water 3. Primary Dressing Applied Hydrofera Blue 4. Secondary Dressing ABD Pad 6. Support Layer Applied Kerlix/[employer] Notes ketoconozole to [employer]) Signed: 09/06/2019 11:25:39 AM By: Mikeal Hawthorne EMT/HBOT Signed: 09/06/2019 12:04:56 PM By: Kela Millin Entered By: Mikeal Hawthorne on 09/06/2019 10:07:59 -------------------------------------------------------------------------------- Wound Assessment Details Patient Name: Date of Service: Daniel Pia D. 09/05/2019 8:00 AM Medical Record [id number] Patient Account Number: [id number] Date of Birth/Sex: Treating RN: 07-Oct-1940 (78 y.o. [name] Primary Care Iran Rowe: Hurshel Party Other Clinician: Referring Frederich Montilla: Treating Suresh Audi/Extender:Robson, Cheryl Flash, [place] Weeks in Treatment: 27 Wound Status Wound Number: 14 Primary Diabetic Wound/Ulcer of the Lower Extremity Etiology: Wound Location: Left Toe Third Wound Open Wounding Event: Gradually Appeared Status: Date Acquired: 08/22/2019 Comorbid Cataracts, Hypertension, Peripheral Venous Weeks Of Treatment: 2 History: Disease, Type II Diabetes, Gout,  Received Clustered Wound: No Radiation Photos Wound Measurements Length: (cm) 0.2 % Reduction i Width: (cm) 0.2 % Reduction i Depth: (cm) 0.1 Epithelializa Area: (cm) 0.031 Tunneling: Volume: (cm) 0.003 Undermining: Wound Description Classification: Grade 1 Foul Odor Aft Wound Margin: Distinct, outline attached Slough/Fibrin Exudate Amount: Medium Exudate Type: Serosanguineous Exudate Color: red, brown Wound Bed Granulation Amount: Large (67-100%) Granulation Quality: Red, Pink Fascia Expose Necrotic Amount: None Present (0%) Fat Layer (Su Tendon Expose Muscle Expose Joint Exposed Bone Exposed: er Cleansing: No o No Exposed Structure d: No bcutaneous Tissue) Exposed: No d: No d: No : No No n Area: 97.8% n Volume: 97.9% tion: Small (1-33%) No No Treatment Notes Wound #14 (Left Toe Third) 1. Cleanse With Wound Cleanser Soap and water 3. Primary Dressing Applied Hydrofera Blue 4. Secondary Dressing ABD Pad 6. Support Layer  [employer] Notes ketoconozole to [employer]) Signed: 09/06/2019 11:25:39 AM By: Mikeal Hawthorne EMT/HBOT Signed: 09/06/2019 12:04:56 PM By: Kela Millin Entered By: Mikeal Hawthorne on 09/06/2019 10:08:21 -------------------------------------------------------------------------------- Wound Assessment Details Patient Name: Date of Service: THAILAND, DUBE D. 09/05/2019 8:00 AM Medical Record [id number] Patient Account Number: [id number] Date of Birth/Sex: Treating RN: 27-May-1941 (78 y.o. [name] Primary Care [place]: [place] Party Other Clinician: Referring Bailei Buist: Treating Keaden Gunnoe/Extender:Robson, Cheryl Flash, [place] Weeks in Treatment: 27 Wound Status Wound Number: 15 Primary Diabetic Wound/Ulcer of the Lower Extremity Etiology: Wound Location: Left Lower Leg - Anterior Wound Open Wounding Event: Blister Status: Date Acquired: 08/29/2019 Comorbid Cataracts,  Hypertension, Peripheral Venous Weeks Of Treatment: 1 History: Disease, Type II Diabetes, Gout, Received Clustered Wound: No Radiation Photos Wound Measurements Length: (cm) 3.2 Width: (cm) 5.6 Depth: (cm) 0.2 Area: (cm) 14.074 Volume: (cm) 2.815 Wound Description Classification: Grade 2 Exudate Amount: Medium Exudate Type: Serous Exudate Color: amber Wound Bed Granulation Amount: Medium (34-66%) Granulation Quality: Red Necrotic Amount: Medium (34-66%) Necrotic Quality: Adherent Slough Foul Odor After Cleansing: N Slough/Fibrino N Exposed Structure Fascia Exposed: N Fat Layer (Subcutaneous Tissue) Exposed: Y Tendon Exposed: N Muscle Exposed: N Joint Exposed: N Bone Exposed: N % Reduction in Area: -70.7% % Reduction in Volume: -241.2% Epithelialization: None Tunneling: No Undermining: No o o o es o o o o Treatment Notes Wound #15 (Left, Anterior Lower Leg) 1. Cleanse With Wound Cleanser Soap and water 3. Primary Dressing Applied Hydrofera Blue 4. Secondary Dressing ABD Pad 6. Support Layer [employer] Notes ketoconozole to [employer]) Signed: 09/06/2019 11:25:39 AM By: Mikeal Hawthorne EMT/HBOT Signed: 09/06/2019 12:04:56 PM By: Kela Millin Entered By: Mikeal Hawthorne on 09/06/2019 10:08:45 -------------------------------------------------------------------------------- Wound Assessment Details Patient Name: Date of Service: KEIONDRE, COLEE D. 09/05/2019 8:00 AM Medical Record [id number] Patient Account Number: [id number] Date of Birth/Sex: Treating RN: 08/22/1941 (78 y.o. [name] Primary Care [place]: [place] Party Other Clinician: Referring Tariya Morrissette: Treating Ac Colan/Extender:Robson, Cheryl Flash, [place] Weeks in Treatment: 27 Wound Status Wound Number: 16 Primary Venous Leg Ulcer Etiology: Wound Location: Left Lower Leg - Anterior, Distal Wound Open Wounding Event:  Blister Status: Date Acquired: 09/05/2019 Date Acquired: 09/05/2019 Comorbid Cataracts, Hypertension, Peripheral Venous Weeks Of Treatment: 0 History: Disease, Type II Diabetes, Gout, Received Clustered Wound: No Radiation Photos Wound Measurements Length: (cm) 0.6 % Reduct Width: (cm) 0.5 % Reduct Depth: (cm) 0.1 Epitheli Area: (cm) 0.236 Tunneli Volume: (cm) 0.024 Undermi Wound Description Classification: Full Thickness Without Exposed Support Foul Odo Structures Slough/F Wound Distinct, outline attached Margin: Exudate Medium Amount: Exudate Serous Type: Exudate amber Color: Wound Bed Granulation Amount: Medium (34-66%) Necrotic Amount: Medium (34-66%) Fascia E Necrotic Quality: Adherent Slough Fat Laye Tendon E Muscle E Joint Ex Bone Exp r After Cleansing: No ibrino Yes Exposed Structure xposed: No r (Subcutaneous Tissue) Exposed: Yes xposed: No xposed: No posed: No osed: No ion in Area: 0% ion in Volume: 0% alization: None ng: No ning: No Treatment Notes Wound #16 (Left, Distal, Anterior Lower Leg) 1. Cleanse With Wound Cleanser Soap and water 3. Primary Dressing Applied Hydrofera Blue 4. Secondary Dressing ABD Pad 6. Support Layer Applied [employer] Notes ketoconozole to [employer]) Signed: 09/06/2019 11:25:39 AM By: Mikeal Hawthorne EMT/HBOT Signed: 09/06/2019 12:04:56 PM By: Kela Millin Entered By: Mikeal Hawthorne on 09/06/2019 10:13:56 -------------------------------------------------------------------------------- Wound Assessment Details Patient Name: Date of Service: Daniel Pia D. 09/05/2019 8:00 AM Medical Record [id number] Patient Account Number: [id number] Date  of Birth/Sex: Treating RN: 11-19-1940 (78 y.o. [name] Primary Care Arian Murley: [place] Party Other Clinician: Referring Hattye Siegfried: Treating Lyric Hoar/Extender:Robson, Cheryl Flash, [place] Weeks in Treatment:  27 Wound Status Wound Number: 17 Primary Venous Leg Ulcer Etiology: Wound Location: Right Lower Leg - Medial Wound Open Wounding Event: Blister Status: Date Acquired: 09/05/2019 Comorbid Cataracts, Hypertension, Peripheral Venous Weeks Of Treatment: 0 History: Disease, Type II Diabetes, Gout, Received Clustered Wound: No Radiation Photos Wound Measurements Length: (cm) 1.1 % Reduct Width: (cm) 1.5 % Reduct Depth: (cm) 0.1 Epitheli Area: (cm) 1.296 Tunneli Volume: (cm) 0.13 Undermi Wound Description Full Thickness Without Exposed Support Foul Odo Classification: Structures Slough/F Wound Distinct, outline attached Margin: Exudate Medium Amount: Exudate Serous Type: Exudate amber Color: Wound Bed Granulation Amount: Large (67-100%) Granulation Quality: Pink Fascia Expos Necrotic Amount: Small (1-33%) Fat Layer (S Necrotic Quality: Adherent Slough Tendon Expos Muscle Expos Joint Expose Bone Exposed r After Cleansing: No ibrino Yes Exposed Structure ed: No ubcutaneous Tissue) Exposed: Yes ed: No ed: No d: No : No ion in Area: 0% ion in Volume: 0% alization: None ng: No ning: No Treatment Notes Wound #17 (Right, Medial Lower Leg) 1. Cleanse With Wound Cleanser Soap and water 3. Primary Dressing Applied Hydrofera Blue 4. Secondary Dressing ABD Pad 6. Support Layer Applied Kerlix/[employer] Haemat[employer] Notes ketoconozole to [employer]) Signed: 09/06/2019 11:25:39 AM By: Mikeal Hawthorne EMT/HBOT Signed: 09/06/2019 12:04:56 PM By: Kela Millin Entered By: Mikeal Hawthorne on 09/06/2019 10:09:27 -------------------------------------------------------------------------------- Wound Assessment Details Patient Name: Date of Service: Daniel Pia D. 09/05/2019 8:00 AM Medical Record [id number] Patient Account Number: [id number] Date of Birth/Sex: Treating RN: 1941-05-14 (78 y.o. [name]) [name] Primary Care Jemima Petko: [place] Party Other Clinician: Referring Sherrelle Prochazka: Treating Keymoni Mccaster/Extender:Robson, Cheryl Flash, [place] Weeks in Treatment: 27 Wound Status Wound Number: 18 Primary Diabetic Wound/Ulcer of the Lower Extremity Etiology: Wound Location: Left Foot - Medial Wound Open Wounding Event: Gradually Appeared Status: Date Acquired: 09/03/2019 Comorbid Cataracts, Hypertension, Peripheral Venous Weeks Of Treatment: 0 History: Disease, Type II Diabetes, Gout, Received Clustered Wound: No Radiation Photos Wound Measurements Length: (cm) 1 % Reducti Width: (cm) 1.3 % Reducti Depth: (cm) 0.1 Epithelia Area: (cm) 1.021 Tunnelin Volume: (cm) 0.102 Undermin Wound Description Classification: Grade 2 Wound Margin: Flat and Intact Exudate Amount: Medium Exudate Type: Serosanguineous Exudate Color: red, brown Wound Bed Granulation Amount: None Present (0%) Necrotic Amount: None Present (0%) Foul Odor After Cleansing: No Slough/Fibrino No Exposed Structure Fascia Exposed: No Fat Layer (Subcutaneous Tissue) Exposed: No Tendon Exposed: No Muscle Exposed: No Joint Exposed: No Bone Exposed: No on in Area: 0% on in Volume: 0% lization: None g: No ing: No Treatment Notes Wound #18 (Left, Medial Foot) 1. Cleanse With Wound Cleanser Soap and water 3. Primary Dressing Applied Hydrofera Blue 4. Secondary Dressing ABD Pad 6. Support Layer Applied [employer] Notes ketoconozole to [employer]) Signed: 09/06/2019 11:25:39 AM By: Mikeal Hawthorne EMT/HBOT Signed: 09/06/2019 12:07:41 PM By: [name] RN Previous Signature: 09/05/2019 2:52:36 PM Version By: [name] RN Entered By: Mikeal Hawthorne on 09/06/2019 10:14:36 -------------------------------------------------------------------------------- Wound Assessment Details Patient Name: Date of Service: Daniel Pia D. 09/05/2019 8:00 AM Medical Record [id number] Patient Account Number:  [id number] Date of Birth/Sex: Treating RN: 1941/02/20 (78 y.o. [name] Primary Care Nemiah Bubar: [place] Party Other Clinician: Referring Heaven Meeker: Treating [place] Sarinana/Extender:Robson, Cheryl Flash, [place] Weeks in Treatment: 27 Wound Status Wound Number: [id number] Primary Diabetic Wound/Ulcer of the Lower Extremity Etiology: Wound Location: Right Calf - Posterior Wound  Open Wounding Event: Gradually Appeared Status: Date Acquired: 02/28/2019 Comorbid Cataracts, Hypertension, Peripheral Venous Weeks Of Treatment: 27 History: Disease, Type II Diabetes, Gout, Received Clustered Wound: No Radiation Photos Wound Measurements Length: (cm) 2.1 % Reduction i Width: (cm) 1.9 % Reduction i Depth: (cm) 0.1 Epithelializa Area: (cm) 3.134 Tunneling: Volume: (cm) 0.313 Undermining: Wound Description Classification: Grade 2 Foul Odor Aft Wound Margin: Distinct, outline attached Slough/Fibrin Exudate Amount: Medium Exudate Type: Serous Exudate Color: amber Wound Bed Granulation Amount: Medium (34-66%) Granulation Quality: Red, Pink, Pale Fascia Expose Necrotic Amount: Medium (34-66%) Fat Layer (Su Necrotic Quality: Adherent Slough Tendon Expose Muscle Expose Joint Exposed Bone Exposed: er Cleansing: No o No Exposed Structure d: No bcutaneous Tissue) Exposed: Yes d: No d: No : No No n Area: 87.2% n Volume: 87.2% tion: None No No Treatment Notes Wound #3R (Right, Posterior Calf) 1. Cleanse With Wound Cleanser Soap and water 3. Primary Dressing Applied Hydrofera Blue 4. Secondary Dressing ABD Pad 6. Support Layer Applied [employer] Notes ketoconozole to [employer]) Signed: 09/06/2019 11:25:39 AM By: Mikeal Hawthorne EMT/HBOT Signed: 09/06/2019 12:04:56 PM By: Kela Millin Entered By: Mikeal Hawthorne on 09/06/2019 10:07:37 -------------------------------------------------------------------------------- Vitals Details Patient  Name: Date of Service: Daniel Pia D. 09/05/2019 8:00 AM Medical Record [id number] Patient Account Number: [id number] Date of Birth/Sex: Treating RN: [birth year] (78 y.o. [name] Primary Care Chais Fehringer: [place] Party Other Clinician: Referring Falon Huesca: Treating Devian Bartolomei/Extender:Robson, Cheryl Flash, [place] Weeks in Treatment: 27 Vital Signs Time Taken: 08:30 Temperature (F): 98.1 Height (in): 74 Pulse (bpm): 82 Weight (lbs): 212 Respiratory Rate (breaths/min): 20 Body Mass Index (BMI): 27.2 Blood Pressure (mmHg): 126/79 Reference Range: 80 - 120 mg / dl Electronic Signature(s) Signed: 09/06/2019 12:04:56 PM By: Kela Millin Entered By: Kela Millin on 09/05/2019 08:30:29

## 2019-09-06 NOTE — Progress Notes (Signed)
[name], [name] ([id number]) Visit Report for 08/01/2019 Arrival Information Details Patient Name: Date of Service: [name], [name] 08/01/2019 8:00 AM Medical Record [place] Patient Account Number: [id number] Date of Birth/Sex: Treating RN: Jan 12, 1941 (78 y.o. [name] Primary Care Zahriyah Joo: Hurshel Party Other Clinician: Referring Vanesha [place]: Treating Macklyn Glandon/Extender:Robson, Cheryl Flash, [name] Weeks in Treatment: 22 Visit Information History Since Last Visit Added or deleted any medications: No Patient Arrived: Ambulatory Any new allergies or adverse reactions: No Arrival Time: 07:46 Had a fall or experienced change in No Accompanied By: wife activities of daily living that may affect Transfer Assistance: None risk of falls: Patient Identification Verified: Yes Signs or symptoms of abuse/neglect since last No Secondary Verification Process Yes visito Completed: Hospitalized since last visit: No Patient Requires Transmission- No Implantable device outside of the clinic excluding No Based Precautions: cellular tissue based products placed in the center Patient Has Alerts: Yes since last visit: Patient Alerts: Patient on Blood Has Dressing in Place as Prescribed: Yes Thinner Has Compression in Place as Prescribed: Yes Pain Present Now: No Electronic Signature(s) Signed: 08/07/2019 5:46:53 PM By: Levan Hurst RN, BSN Entered By: Levan Hurst on 08/01/2019 07:47:14 -------------------------------------------------------------------------------- Compression Therapy Details Patient Name: Date of Service: Daniel Pia D. 08/01/2019 8:00 AM Medical Record [id number] Patient Account Number: [id number] Date of Birth/Sex: Treating RN: 12/08/1940 (78 y.o. [name]) [name] Primary Care Jader Desai: Hurshel Party Other Clinician: Referring Jerolene Kupfer: Treating Cherrell Maybee/Extender:Robson, Cheryl Flash, [place] Weeks in Treatment: 22 Compression  Therapy Performed for Wound Wound #3R Right,Posterior Calf Assessment: Performed By: Jake Church, RN Compression Type: Four Layer Post Procedure Diagnosis Same as Pre-procedure Electronic Signature(s) Signed: 09/06/2019 12:05:18 PM By: [name] RN Entered By: [name] on 08/01/2019 08:44:20 -------------------------------------------------------------------------------- Compression Therapy Details Patient Name: Date of Service: [name], [name] 08/01/2019 8:00 AM Medical Record [id number] Patient Account Number: [id number] Date of Birth/Sex: Treating RN: 08-Jul-1941 (78 y.o. [name]) [name] Primary Care Julian Askin: Hurshel Party Other Clinician: Referring Lathen Seal: Treating [place] Segler/Extender:Robson, Cheryl Flash, [place] Weeks in Treatment: 22 Compression Therapy Performed for Wound Wound #9R Left,Distal,Lateral Lower Leg Assessment: Performed By: Jake Church, RN Compression Type: Four Layer Post Procedure Diagnosis Same as Pre-procedure Electronic Signature(s) Signed: 09/06/2019 12:05:18 PM By: [name] RN Entered By: [name] on 08/01/2019 08:44:20 -------------------------------------------------------------------------------- Encounter Discharge Information Details Patient Name: Date of Service: Daniel Pia D. 08/01/2019 8:00 AM Medical Record [id number] Patient Account Number: [id number] Date of Birth/Sex: Treating RN: June 17, 1941 (78 y.o. [name] Primary Care Shep Porter: Hurshel Party Other Clinician: Referring Julie Nay: Treating Taevon Aschoff/Extender:Robson, Cheryl Flash, [place] Weeks in Treatment: 22 Encounter Discharge Information Items Discharge Condition: Stable Ambulatory Status: Ambulatory Discharge Destination: Home Transportation: Private Auto Accompanied By: wife Schedule Follow-up Appointment: Yes Clinical Summary of Care: Patient Declined Electronic Signature(s) Signed: 08/03/2019 5:16:49  PM By: Kela Millin Entered By: Kela Millin on 08/01/2019 08:56:12 -------------------------------------------------------------------------------- Lower Extremity Assessment Details Patient Name: Date of Service: [name], [name] 08/01/2019 8:00 AM Medical Record [id number] Patient Account Number: [id number] Date of Birth/Sex: Treating RN: [birth year] (78 y.o. [name] Primary Care Dustina Scoggin: Hurshel Party Other Clinician: Referring Alvester Eads: Treating Dyquan Minks/Extender:Robson, Cheryl Flash, [place] Weeks in Treatment: 22 Edema Assessment Assessed: [Left: No] [Right: No] Edema: [Left: No] [Right: No] Calf Left: Right: Point of Measurement: 31 cm From Medial Instep 31.3 cm 33.2 cm Ankle Left: Right: Point of Measurement: 11 cm From Medial Instep 19.5 cm 19.8 cm Vascular Assessment Pulses: Dorsalis Pedis Palpable: [Left:Yes] [Right:Yes] Electronic Signature(s) Signed: 08/07/2019 5:46:53 PM By: Levan Hurst  RN, BSN Entered By: Levan Hurst on 08/01/2019 07:55:15 -------------------------------------------------------------------------------- Multi Wound Chart Details Patient Name: Date of Service: [name], [name] 08/01/2019 8:00 AM Medical Record [place] Patient Account Number: [id number] Date of Birth/Sex: Treating RN: 1941-07-21 (78 y.o. 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] [N/A:N/A] Comorbid History: [3R:Cataracts, Hypertension, Cataracts, Hypertension, N/A Peripheral Venous Disease, Peripheral Venous Disease, Type II Diabetes, Gout, Received Radiation] [9R:Type II Diabetes, Gout, Received Radiation] Date Acquired: [3R:02/28/2019] [9R:05/17/2019] [N/A:N/A] Weeks of Treatment: [3R:22] [9R:10] [N/A:N/A] Wound Status: [3R:Open] [9R:Open] [N/A:N/A] Wound Recurrence: [3R:Yes] [9R:Yes] [N/A:N/A] Measurements L x W x D 2.5x3x0.1 [9R:0.2x0.2x0.1] [N/A:N/A] (cm) Area (cm) : [3R:5.89] [9R:0.031] [N/A:N/A] Volume (cm) : [3R:0.589] [9R:0.003] [N/A:N/A] % Reduction in Area: [3R:76.00%] [9R:99.60%] [N/A:N/A] % Reduction in Volume: 76.00% [9R:99.70%] [N/A:N/A] Classification: [3R:Grade 2] [9R:Full Thickness Without Exposed Support Structures] [N/A:N/A] Exudate Amount: [3R:Medium] [9R:Small] [N/A:N/A] Exudate Type: [3R:Serosanguineous] [9R:Serosanguineous] [N/A:N/A] Exudate Color: [3R:red, brown] [9R:red, brown] [N/A:N/A] Wound Margin: [3R:Flat and Intact] [9R:Flat and Intact] [N/A:N/A] Granulation Amount: [3R:Large (67-100%)] [9R:Large (67-100%)] [N/A:N/A] Granulation Quality: [3R:Red] [9R:Red] [N/A:N/A] Necrotic Amount: [3R:None Present (0%)] [9R:None Present (0%)] [N/A:N/A] Exposed Structures: [3R:Fat Layer (Subcutaneous Fat Layer (Subcutaneous N/A Tissue) Exposed: Yes Fascia: No Tendon: No Muscle: No Joint: No Bone: No] [9R:Tissue) Exposed: Yes Fascia: No Tendon: No Muscle: No Joint: No Bone: No] Epithelialization: [3R:None] [9R:Large (67-100%) Compression Therapy] [N/A:N/A N/A] Treatment Notes Wound #3R (Right, Posterior Calf) 1. Cleanse With Wound Cleanser Soap and water 2. Periwound Care TCA Cream 3. Primary Dressing Applied Hydrofera Blue 4. Secondary Dressing ABD Pad Dry Gauze 6. Support Layer Applied 4 layer compression wrap Wound #9R (Left, Distal, Lateral Lower Leg) 1. Cleanse With Wound Cleanser Soap and water 2.  Periwound Care TCA Cream 3. Primary Dressing Applied Hydrofera Blue 4. Secondary Dressing ABD Pad Dry Gauze 6. Support Layer Applied 4 layer compression Water quality scientist) Signed: 08/01/2019 6:03:12 PM By: [name] MD Signed: 09/06/2019 12:05:18 PM By: [name] RN Entered By: [name] on 08/01/2019 09:00:48 -------------------------------------------------------------------------------- Multi-Disciplinary Care Plan Details Patient Name: Date of Service: Daniel Pia D. 08/01/2019 8:00 AM Medical Record [id number] Patient Account Number: [id number] Date of Birth/Sex: Treating RN: Sep 04, 1941 (78 y.o. [name]) [name] Primary Care Provider: Hurshel Party Other Clinician: Referring Provider: Treating Provider/Extender:Robson, Cheryl Flash, [place] Weeks in Treatment: 22 Active Inactive Wound/Skin Impairment Nursing Diagnoses: Knowledge deficit related to ulceration/compromised skin integrity Goals: Patient/caregiver will verbalize understanding of skin care regimen Date Initiated: 02/28/2019 Target Resolution Date: 08/04/2019 Goal Status: Active Ulcer/skin breakdown will have a volume reduction of 30% by week 4 Date Initiated: 02/28/2019 Date Inactivated: 04/04/2019 Target Resolution Date: 03/31/2019 Goal Status: Met Ulcer/skin breakdown will have a volume reduction of 50% by week 8 Date Initiated: 04/04/2019 Date Inactivated: 05/09/2019 Target Resolution Date: 05/05/2019 Goal Status: Met Ulcer/skin breakdown will have a volume reduction of 80% by week 12 Date Initiated: 05/09/2019 Date Inactivated: 06/13/2019 Target Resolution Date: 06/09/2019 Unmet Goal Status: Unmet Reason: comorbities/new wounds Ulcer/skin breakdown will heal within 14 weeks Date Initiated: 06/13/2019 Date Inactivated: 07/11/2019 Target Resolution Date: 07/07/2019 Unmet Goal Status: Unmet Reason: comorbityies Interventions: Assess patient/caregiver ability to  obtain necessary  supplies Assess patient/caregiver ability to perform ulcer/skin care regimen upon admission and as needed Assess ulceration(s) every visit Notes: Electronic Signature(s) Signed: 09/06/2019 12:05:18 PM By: [name] RN Entered By: [name] on 08/01/2019 07:43:28 -------------------------------------------------------------------------------- Pain Assessment Details Patient Name: Date of Service: [name], [name] 08/01/2019 8:00 AM Medical Record [id number] Patient Account Number: [id number] Date of Birth/Sex: Treating RN: 02-09-1941 (78 y.o. [name] Primary Care Provider: Hurshel Party Other Clinician: Referring Provider: Treating Provider/Extender:Robson, Cheryl Flash, [place] Weeks in Treatment: 22 Active Problems Location of Pain Severity and Description of Pain Patient Has Paino No Site Locations Pain Management and Medication Current Pain Management: Electronic Signature(s) Signed: 08/07/2019 5:46:53 PM By: Levan Hurst RN, BSN Entered By: Levan Hurst on 08/01/2019 07:49:51 -------------------------------------------------------------------------------- Patient/Caregiver Education Details Patient Name: Date of Service: [name], Daniel D. 11/3/2020andnbsp8:00 AM Medical Record [id number] Patient Account Number: [id number] Date of Birth/Gender: Treating RN: Mar 26, 1941 (78 y.o. [name] Primary Care Physician: Hurshel Party Other Clinician: Referring Physician: Treating Physician/Extender:Robson, Cheryl Flash, [place] Weeks in Treatment: 22 Education Assessment Education Provided To: Patient Education Topics Provided Wound/Skin Impairment: Methods: Explain/Verbal Responses: State content correctly Electronic Signature(s) Signed: 09/06/2019 12:05:18 PM By: [name] RN Entered By: [name] on 08/01/2019 07:43:42 -------------------------------------------------------------------------------- Wound Assessment  Details Patient Name: Date of Service: [name], [name] 08/01/2019 8:00 AM Medical Record [id number] Patient Account Number: [id number] Date of Birth/Sex: Treating RN: [birth year] (78 y.o. [name] Primary Care Provider: Hurshel Party Other Clinician: Referring Provider: Treating Provider/Extender:Robson, Cheryl Flash, [place] Weeks in Treatment: 22 Wound Status Wound Number: 3R Primary Diabetic Wound/Ulcer of the Lower Extremity Etiology: Wound Location: Right Calf - Posterior Wound Open Wounding Event: Gradually Appeared Status: Date Acquired: 02/28/2019 ComorbidCataracts, Hypertension, Peripheral Venous Weeks Of Treatment: 22 Weeks Of Treatment: 22 History: Disease, Type II Diabetes, Gout, Received Clustered Wound: No Radiation Photos Wound Measurements Length: (cm) 2.5 Width: (cm) 3 Depth: (cm) 0.1 Area: (cm) 5.89 Volume: (cm) 0.589 Wound Description Classification: Grade 2 Wound Margin: Flat and Intact Exudate Amount: Medium Exudate Type: Serosanguineous Exudate Color: red, brown Wound Bed Granulation Amount: Large (67-100%) Granulation Quality: Red Necrotic Amount: None Present (0%) fter Cleansing: No ino No Exposed Structure ed: No ubcutaneous Tissue) Exposed: Yes ed: No ed: No d: No : No % Reduction in Area: 76% % Reduction in Volume: 76% Epithelialization: None Tunneling: No Undermining: No Foul Odor A Slough/Fibr Fascia Expos Fat Layer (S Tendon Expos Muscle Expos Joint Expose Bone Exposed Electronic Signature(s) Signed: 08/03/2019 4:13:56 PM By: Mikeal Hawthorne EMT/HBOT Signed: 08/07/2019 5:46:53 PM By: Levan Hurst RN, BSN Entered By: Mikeal Hawthorne on 08/03/2019 11:16:40 -------------------------------------------------------------------------------- Wound Assessment Details Patient Name: Date of Service: Daniel Pia D. 08/01/2019 8:00 AM Medical Record [id number] Patient Account Number: [id number] Date of  Birth/Sex: Treating RN: 04-Sep-1941 (78 y.o. [name] Primary Care Provider: Hurshel Party Other Clinician: Referring Provider: Treating Provider/Extender:Robson, Cheryl Flash, [place] Weeks in Treatment: 22 Wound Status Wound Number: 9R Primary Venous Leg Ulcer Etiology: Wound Location: Left Lower Leg - Lateral, Distal Wound Open Wounding Event: Not Known Status: Date Acquired: 05/17/2019 Comorbid Cataracts, Hypertension, Peripheral Venous Weeks Of Treatment: 10 History: Disease, Type II Diabetes, Gout, Received Clustered Wound: No Radiation Photos Wound Measurements Length: (cm) 0.2 % Reduct Width: (cm) 0.2 % Reduct Depth: (cm) 0.1 Epitheli Area: (cm) 0.031 Tunneli Volume: (cm) 0.003 Undermi Wound Description Full Thickness Without Exposed Support Foul Odo Classification: Structures Slough/F Wound Flat and Intact Margin: Exudate Small Amount: Exudate Serosanguineous Type: Exudate  red, brown Color: Wound Bed Granulation Amount: Large (67-100%) Granulation Quality: Red Fascia E Necrotic Amount: None Present (0%) Fat Laye Tendon E Muscle E Joint Ex Bone Exp r After Cleansing: No ibrino No Exposed Structure xposed: No r (Subcutaneous Tissue) Exposed: Yes xposed: No xposed: No posed: No osed: No ion in Area: 99.6% ion in Volume: 99.7% alization: Large (67-100%) ng: No ning: No Electronic Signature(s) Signed: 08/03/2019 4:13:56 PM By: Mikeal Hawthorne EMT/HBOT Signed: 08/07/2019 5:46:53 PM By: Levan Hurst RN, BSN Entered By: Mikeal Hawthorne on 08/03/2019 11:16:20 -------------------------------------------------------------------------------- Vitals Details Patient Name: Date of Service: Daniel Pia D. 08/01/2019 8:00 AM Medical Record [id number] Patient Account Number: [id number] Date of Birth/Sex: Treating RN: 1941/08/13 (78 y.o. [name] Primary Care Provider: Hurshel Party Other Clinician: Referring Provider:  Treating Provider/Extender:Robson, Cheryl Flash, [place] Weeks in Treatment: 22 Vital Signs Time Taken: 07:49 Temperature (F): 97.5 Height (in): 74 Pulse (bpm): 65 Weight (lbs): 212 Respiratory Rate (breaths/min): 20 Body Mass Index (BMI): 27.2 Blood Pressure (mmHg): 134/74 Reference Range: 80 - 120 mg / dl Electronic Signature(s) Signed: 08/07/2019 5:46:53 PM By: Levan Hurst RN, BSN Entered By: Levan Hurst on 08/01/2019 07:49:45

## 2019-09-06 NOTE — Progress Notes (Signed)
[name], [name] ([id number]) Visit Report for 08/01/2019 HPI Details Patient Name: Date of Service: [name], [name] 08/01/2019 8:00 AM Medical Record [place] Patient Account Number: [id number] Date of Birth/Sex: Treating RN: 1941/04/08 (78 y.o. [name]) [name] Primary Care Provider: Hurshel Party Other Clinician: Referring Provider: Treating Provider/Extender:Robson, Cheryl Flash, [place] Weeks in Treatment: 22 History of Present Illness Location: Patient presents with a wound to left lower leg. Quality: Patient reports No Pain. Duration: 2 months HPI Description: no cig or alcohol. spontaneous appearance in area of stasis dermamtitis. [name] on metformin only. chronic afib on Coumadin. diabetes and coag studies not good. hba1c 7.5. ivr 4.5. no pain or sxs of systemic disease. hx chf. no intermittent claudication 02/28/2019 Readmission This is a now a 78 year old man who was previously cared for in 2016 by Dr. Lindon Romp for wounds on his lower extremities. At that point he had venous reflux studies although I cannot seem to open these in [place] link. He had arterial studies showing an ABI of 1.11 on the right and 1.27 on the left his waveforms were triphasic bilaterally. He was discharged in stockings although I do not believe he is wearing these in some time. He tells me that about a month ago he noted openings of a large wound on the posterior right calf and 2 smaller areas on the left lateral calf and a small area more recently on the left posterior calf. He has been dressing these with peroxide and triple antibiotic ointment. He is not wearing compression. Past medical history; type 2 diabetes with peripheral neuropathy, chronic venous insufficiency, hypertension, cardiomyopathy, chronic atrial fibrillation on Coumadin, prostate cancer, hyperlipidemia, gout, ABI in our clinic was 1.34 on the left and not obtainable on the right 6/9; this is a patient who has chronic  venous insufficiency. He has a fairly substantial area on the right posterior calf, left lateral calf and a small area on the left posterior calf. On arrival last week he had very palpable popliteal and femoral pulses but nothing in his bilateral feet. Unfortunately we cannot get arterial studies until July 1 at Dr. Kennon Holter office. They live in [place]. We use silver alginate under Kerlix Coban 6/16; patient with chronic venous insufficiency with wounds on his bilateral lower extremities. When he came into our clinic he was discovered to have a complete absence of peripheral pedal pulses at either the dorsalis pedis or posterior tibial. He does have easily palpable femoral and popliteal pulses. He sees Dr. [name] tomorrow for noninvasive arterial tests. He may also require venous reflux evaluation although I do not view this as an urgent thing. We have been using silver alginate. His wound surfaces of cleaned up quite nicely 6/23; patient with chronic venous insufficiency with wounds on his bilateral lower extremities. His wounds all are somewhat better looking. He did go to Dr. Kennon Holter office but somehow ended up on the doctors schedule rather than being scheduled for noninvasive tests therefore his noninvasive tests are scheduled for July 1. We agree that he has venous insufficiency ulcers but I cannot feel any pulses in his lower extremities dictating the need for test. We are only using Kerlix and light Coban unfortunately this appears to be holding the edema 6/30; has his arterial studies tomorrow. We have been using Kerlix and light Coban will go to a more aggressive compression if the arterial studies will allow. We all agreed these are venous wounds however I cannot feel pulses at either the dorsalis pedis or posterior tibial  bilaterally. His wounds generally look some better including left lateral and right posterior. 7/7-Patient returns at 1 week in Kerlix/Coban to both legs, with  improvement, in the left lateral and right posterior lower leg wounds, ABI's are normal in both legs per vascular studies, TBI is also normal on both sides, we are using hydrofera blue to the wounds 7/14; patient's arterial studies from 2 weeks ago showed an ABI on the right at 1.03 with a TBI of 0.86. On the left the ABI was 1.06 with a TBI of 0.84. Notable for the fact that his arterial waveforms were monophasic in all of the lower extremity arteries suggesting some degree of arterial occlusive disease but in general this was felt to be fairly adequate for healing. His compression was increased from 2-3 layer which is appropriate. Dressing was changed to [facility] 7/21; patient's wounds are measuring smaller. The more substantial one on the right posterior calf, second 1 on the left lateral calf. Using [facility] on both wound areas 7/28; patient continues to make nice improvements. The area on the right posterior calf is smaller. Area on the left lateral calf also is smaller. We have been using Hydrofera Blue under compression. The patient will need compression stockings and we have measured him for these in the eventuality that these heal which really should not be too long from now 8/4-Patient continues to make improvement, the right posterior calf area smaller with rim of keratotic skin on one side, the left wound is definitely smaller and improving. 8/11-Returns at 1 week, after being in 3 layer compression on both legs, both wounds appear to be improving, making good progress, patient is happy, pain is also less especially in the right leg wound 8/18; the area on the left anterior lower leg is healed. On the right posterior leg the wound remains although the dimensions are a lot better. 8/25; he arrives in clinic today with a large body of open wound on the left lateral calf. All of the 3 wounds in this area are in close juxtaposition to each other. The story is that we  discharged him last week with no a wrap on the left leg. They went to [place] could not get in as they are only excepting phone orders or online orders for stockings hence they did not put any stocking on the left leg all week. They have something at home but the patient with that was either incapable or just did not put them on. Apparently these opened 1 morning after getting out of bed. The area on the right has no real change 9/1; patient has bilateral lower extremity wounds in the setting of severe chronic venous insufficiency and secondary lymphedema. He arrived last week with new areas on the left lateral lower leg after we did not wrap him and he did not use his stockings. Nevertheless the areas on the left look better today under compression. Posterior right calf does not really changed. We are using Hydrofera Blue on both areas under compression 9/15; bilateral lower extremity wounds in the setting of severe chronic venous insufficiency and secondary lymphedema. He has 20 to 30 mmHg below-knee compression stockings under the eventuality that these close over. We did get the left leg to close but he did not transition to a stocking and this reopened. There are 2 open areas on the left posterior lateral calf and one on the right. Both of these look satisfactory. Using [facility] 9/22; bilateral lower extremity wounds in the  setting of chronic venous insufficiency. 2 superficial areas on the left lateral calf. One on the right just above the Achilles area. We have good edema control we have been using Hydrofera Blue 9/29; the areas on the left lateral calf are healed. On the right just above the Achilles and tendon area things look a lot better small wounds one scabbed area. We have been using Hydrofera Blue. We can discharge him in his own stocking on the left still wrapping on the right. This is the second time we have healed the left leg but he did not put a stocking on last time.  Hopefully this will maintain the edema from chronic venous disease with secondary lymphedema 10/6; he comes in today having a stocking on the left leg. They had trouble getting it on there is a lot of increase in swelling 2 small open areas one anteriorly and one on the medial calf. They report a lot of difficulty getting the stocking on. Paradoxically the area on the right that we have been wrapping posteriorly is closed 10/13; he comes in today with wounds bilaterally including superficial areas on the left medial and left lateral calf. As well as the right posterior has reopened in the Achilles area superiorly. He still does not have his juxta lite stockings although truthfully we would not of been able to use them today anyway. Apparently have been ordered and paid for from [facility] although they have not been delivered 10/20; his area on the right is just the boat closed on the right posterior. Still has the area on the left lateral and a very tiny area on the left medial. He has his bilateral juxta lites although he is not ready for them this week. He tolerated the increase to 4 layer compression last week quite well 10/27; the area on the right posterior calf is once again closed. He has a superficial area on the left lateral calf that is still open. He has been using Hydrofera Blue and bilateral 4-layer compression. He can change to his own juxta lite stocking on the right and we are instructing him today 11/3; the area on the right posterior calf reopened according to the patient and his wife after they took off the stocking when they got home last week. Apparently scabbed over there is now a fairly substantial wound which looks pretty much the same. Our intake nurse noted that they were using the juxta lite stockings appropriately. I was really hoping I might be able to close him out today. He has 1 very tiny remaining area on the left lateral lower leg. Electronic Signature(s) Signed:  08/01/2019 6:03:12 PM By: [name] MD Entered By: [name] on 08/01/2019 09:02:44 -------------------------------------------------------------------------------- Physical Exam Details Patient Name: Date of Service: [name], [name] 08/01/2019 8:00 AM Medical Record [id number] Patient Account Number: [id number] Date of Birth/Sex: Treating RN: 1941/04/29 (78 y.o. [name]) [name] Primary Care Provider: Hurshel Party Other Clinician: Referring Provider: Treating Provider/Extender:Robson, Cheryl Flash, [place] Weeks in Treatment: 22 Constitutional Sitting or standing Blood Pressure is within target range for patient.. Pulse regular and within target range for patient.Marland Kitchen Respirations regular, non-labored and within target range.. Temperature is normal and within the target range for the patient.Marland Kitchen Appears in no distress. Eyes Conjunctivae clear. No discharge.no icterus. Cardiovascular Pedal pulses are palpable. Changes of chronic venous insufficiency but not much edema on either side. Lymphatic None palpable in the popliteal area. Integumentary (Hair, Skin) No obvious primary cutaneous issue other than the hemosiderin  deposition bilaterally. Psychiatric appears at normal baseline. Notes Wound exam; right posterior lower leg wide open superficial wound in the same location as previously. He has severe chronic venous changes but his edema looks reasonably well controlled. On the left side he has a very tiny pinpoint open area remaining base of this appears clean Electronic Signature(s) Signed: 08/01/2019 6:03:12 PM By: [name] MD Entered By: [name] on 08/01/2019 09:07:06 -------------------------------------------------------------------------------- Physician Orders Details Patient Name: Date of Service: Daniel Pia D. 08/01/2019 8:00 AM Medical Record [id number] Patient Account Number: [id number] Date of Birth/Sex: Treating  RN: 1941/09/04 (78 y.o. [name]) [name] Primary Care Provider: Hurshel Party Other Clinician: Referring Provider: Treating Provider/Extender:Robson, Cheryl Flash, [place] Weeks in Treatment: 46 Verbal / Phone Orders: No Diagnosis Coding ICD-10 Coding Code Description E11.51 Type 2 diabetes mellitus with diabetic peripheral angiopathy without gangrene E11.42 Type 2 diabetes mellitus with diabetic polyneuropathy L97.221 Non-pressure chronic ulcer of left calf limited to breakdown of skin I87.333 Chronic venous hypertension (idiopathic) with ulcer and inflammation of bilateral lower extremity Follow-up Appointments Return Appointment in 1 week. Dressing Change Frequency Do not change entire dressing for one week. Skin Barriers/Peri-Wound Care TCA Cream or Ointment Wound Cleansing May shower with protection. Primary Wound Dressing Wound #3R Right,Posterior Calf Hydrofera Blue Wound #9R Left,Distal,Lateral Lower Leg Hydrofera Blue Secondary Dressing Wound #3R Right,Posterior Calf Dry Gauze Wound #9R Left,Distal,Lateral Lower Leg Dry Gauze Edema Control 4 layer compression - Bilateral Electronic Signature(s) Signed: 08/01/2019 6:03:12 PM By: [name] MD Signed: 09/06/2019 12:05:18 PM By: [name] RN Entered By: [name] on 08/01/2019 08:43:13 -------------------------------------------------------------------------------- Problem List Details Patient Name: Date of Service: Daniel Pia D. 08/01/2019 8:00 AM Medical Record [id number] Patient Account Number: [id number] Date of Birth/Sex: Treating RN: 12/01/1940 (78 y.o. [name]) Dolores Lory, [place] Primary Care Provider: Hurshel Party Other Clinician: Referring Provider: Treating Provider/Extender:Robson, Cheryl Flash, [place] Weeks in Treatment: 22 Active Problems ICD-10 Evaluated Encounter Code Description Active Date Today Diagnosis E11.51 Type 2 diabetes mellitus with diabetic peripheral 02/28/2019 No  Yes angiopathy without gangrene E11.42 Type 2 diabetes mellitus with diabetic polyneuropathy 02/28/2019 No Yes L97.221 Non-pressure chronic ulcer of left calf limited to 02/28/2019 No Yes breakdown of skin I87.333 Chronic venous hypertension (idiopathic) with ulcer 02/28/2019 No Yes and inflammation of bilateral lower extremity L97.211 Non-pressure chronic ulcer of right calf limited to 02/28/2019 No Yes breakdown of skin Inactive Problems Resolved Problems Electronic Signature(s) Signed: 08/01/2019 6:03:12 PM By: [name] MD Entered By: [name] on 08/01/2019 09:10:49 -------------------------------------------------------------------------------- Progress Note Details Patient Name: Date of Service: Daniel Pia D. 08/01/2019 8:00 AM Medical Record [id number] Patient Account Number: [id number] Date of Birth/Sex: Treating RN: 1941-01-03 (78 y.o. [name]) [name] Primary Care Provider: Hurshel Party Other Clinician: Referring Provider: Treating Provider/Extender:Robson, Cheryl Flash, [place] Weeks in Treatment: 22 Subjective History of Present Illness (HPI) The following HPI elements were documented for the patient's wound: Location: Patient presents with a wound to left lower leg. Quality: Patient reports No Pain. Duration: 2 months no cig or alcohol. spontaneous appearance in area of stasis dermamtitis. [name] on metformin only. chronic afib on Coumadin. diabetes and coag studies not good. hba1c 7.5. ivr 4.5. no pain or sxs of systemic disease. hx chf. no intermittent claudication 02/28/2019 Readmission This is a now a 78 year old man who was previously cared for in 2016 by Dr. Lindon Romp for wounds on his lower extremities. At that point he had venous reflux studies although I cannot seem to open these in [place] link. He had  arterial studies showing an ABI of 1.11 on the right and 1.27 on the left his waveforms were triphasic bilaterally. He was discharged in  stockings although I do not believe he is wearing these in some time. He tells me that about a month ago he noted openings of a large wound on the posterior right calf and 2 smaller areas on the left lateral calf and a small area more recently on the left posterior calf. He has been dressing these with peroxide and triple antibiotic ointment. He is not wearing compression. Past medical history; type 2 diabetes with peripheral neuropathy, chronic venous insufficiency, hypertension, cardiomyopathy, chronic atrial fibrillation on Coumadin, prostate cancer, hyperlipidemia, gout, ABI in our clinic was 1.34 on the left and not obtainable on the right 6/9; this is a patient who has chronic venous insufficiency. He has a fairly substantial area on the right posterior calf, left lateral calf and a small area on the left posterior calf. On arrival last week he had very palpable popliteal and femoral pulses but nothing in his bilateral feet. Unfortunately we cannot get arterial studies until July 1 at Dr. Kennon Holter office. They live in [place]. We use silver alginate under Kerlix Coban 6/16; patient with chronic venous insufficiency with wounds on his bilateral lower extremities. When he came into our clinic he was discovered to have a complete absence of peripheral pedal pulses at either the dorsalis pedis or posterior tibial. He does have easily palpable femoral and popliteal pulses. He sees Dr. [name] tomorrow for noninvasive arterial tests. He may also require venous reflux evaluation although I do not view this as an urgent thing. We have been using silver alginate. His wound surfaces of cleaned up quite nicely 6/23; patient with chronic venous insufficiency with wounds on his bilateral lower extremities. His wounds all are somewhat better looking. He did go to Dr. Kennon Holter office but somehow ended up on the doctors schedule rather than being scheduled for noninvasive tests therefore his noninvasive tests  are scheduled for July 1. We agree that he has venous insufficiency ulcers but I cannot feel any pulses in his lower extremities dictating the need for test. We are only using Kerlix and light Coban unfortunately this appears to be holding the edema 6/30; has his arterial studies tomorrow. We have been using Kerlix and light Coban will go to a more aggressive compression if the arterial studies will allow. We all agreed these are venous wounds however I cannot feel pulses at either the dorsalis pedis or posterior tibial bilaterally. His wounds generally look some better including left lateral and right posterior. 7/7-Patient returns at 1 week in Kerlix/Coban to both legs, with improvement, in the left lateral and right posterior lower leg wounds, ABI's are normal in both legs per vascular studies, TBI is also normal on both sides, we are using hydrofera blue to the wounds 7/14; patient's arterial studies from 2 weeks ago showed an ABI on the right at 1.03 with a TBI of 0.86. On the left the ABI was 1.06 with a TBI of 0.84. Notable for the fact that his arterial waveforms were monophasic in all of the lower extremity arteries suggesting some degree of arterial occlusive disease but in general this was felt to be fairly adequate for healing. His compression was increased from 2-3 layer which is appropriate. Dressing was changed to [facility] 7/21; patient's wounds are measuring smaller. The more substantial one on the right posterior calf, second 1 on the left lateral calf. Using  Hydrofera Blue on both wound areas 7/28; patient continues to make nice improvements. The area on the right posterior calf is smaller. Area on the left lateral calf also is smaller. We have been using Hydrofera Blue under compression. The patient will need compression stockings and we have measured him for these in the eventuality that these heal which really should not be too long from now 8/4-Patient continues to  make improvement, the right posterior calf area smaller with rim of keratotic skin on one side, the left wound is definitely smaller and improving. 8/11-Returns at 1 week, after being in 3 layer compression on both legs, both wounds appear to be improving, making good progress, patient is happy, pain is also less especially in the right leg wound 8/18; the area on the left anterior lower leg is healed. On the right posterior leg the wound remains although the dimensions are a lot better. 8/25; he arrives in clinic today with a large body of open wound on the left lateral calf. All of the 3 wounds in this area are in close juxtaposition to each other. The story is that we discharged him last week with no a wrap on the left leg. They went to [place] could not get in as they are only excepting phone orders or online orders for stockings hence they did not put any stocking on the left leg all week. They have something at home but the patient with that was either incapable or just did not put them on. Apparently these opened 1 morning after getting out of bed. The area on the right has no real change 9/1; patient has bilateral lower extremity wounds in the setting of severe chronic venous insufficiency and secondary lymphedema. He arrived last week with new areas on the left lateral lower leg after we did not wrap him and he did not use his stockings. Nevertheless the areas on the left look better today under compression. Posterior right calf does not really changed. We are using Hydrofera Blue on both areas under compression 9/15; bilateral lower extremity wounds in the setting of severe chronic venous insufficiency and secondary lymphedema. He has 20 to 30 mmHg below-knee compression stockings under the eventuality that these close over. We did get the left leg to close but he did not transition to a stocking and this reopened. There are 2 open areas on the left posterior lateral calf and one on the  right. Both of these look satisfactory. Using [facility] 9/22; bilateral lower extremity wounds in the setting of chronic venous insufficiency. 2 superficial areas on the left lateral calf. One on the right just above the Achilles area. We have good edema control we have been using Hydrofera Blue 9/29; the areas on the left lateral calf are healed. On the right just above the Achilles and tendon area things look a lot better small wounds one scabbed area. We have been using Hydrofera Blue. We can discharge him in his own stocking on the left still wrapping on the right. This is the second time we have healed the left leg but he did not put a stocking on last time. Hopefully this will maintain the edema from chronic venous disease with secondary lymphedema 10/6; he comes in today having a stocking on the left leg. They had trouble getting it on there is a lot of increase in swelling 2 small open areas one anteriorly and one on the medial calf. They report a lot of difficulty getting the stocking on.   ooParadoxically the area on the right that we have been wrapping posteriorly is closed 10/13; he comes in today with wounds bilaterally including superficial areas on the left medial and left lateral calf. As well as the right posterior has reopened in the Achilles area superiorly. He still does not have his juxta lite stockings although truthfully we would not of been able to use them today anyway. Apparently have been ordered and paid for from [employer] although they have not been delivered 10/20; his area on the right is just the boat closed on the right posterior. Still has the area on the left lateral and a very tiny area on the left medial. He has his bilateral juxta lites although he is not ready for them this week. He tolerated the increase to 4 layer compression last week quite well 10/27; the area on the right posterior calf is once again closed. He has a superficial area on the left lateral  calf that is still open. He has been using Hydrofera Blue and bilateral 4-layer compression. He can change to his own juxta lite stocking on the right and we are instructing him today 11/3; the area on the right posterior calf reopened according to the patient and his wife after they took off the stocking when they got home last week. Apparently scabbed over there is now a fairly substantial wound which looks pretty much the same. Our intake nurse noted that they were using the juxta lite stockings appropriately. I was really hoping I might be able to close him out today. He has 1 very tiny remaining area on the left lateral lower leg. Objective Constitutional Sitting or standing Blood Pressure is within target range for patient.. Pulse regular and within target range for patient.Marland Kitchen Respirations regular, non-labored and within target range.. Temperature is normal and within the target range for the patient.Marland Kitchen Appears in no distress. Vitals Time Taken: 7:49 AM, Height: 74 in, Weight: 212 lbs, BMI: 27.2, Temperature: 97.5 F, Pulse: 65 bpm, Respiratory Rate: 20 breaths/min, Blood Pressure: 134/74 mmHg. Eyes Conjunctivae clear. No discharge.no icterus. Cardiovascular Pedal pulses are palpable. Changes of chronic venous insufficiency but not much edema on either side. Lymphatic None palpable in the popliteal area. Psychiatric appears at normal baseline. General Notes: Wound exam; right posterior lower leg wide open superficial wound in the same location as previously. He has severe chronic venous changes but his edema looks reasonably well controlled. On the left side he has a very tiny pinpoint open area remaining base of this appears clean Integumentary (Hair, Skin) No obvious primary cutaneous issue other than the hemosiderin deposition bilaterally. Wound #3R status is Open. Original cause of wound was Gradually Appeared. The wound is located on the Right,Posterior Calf. The wound measures  2.5cm length x 3cm width x 0.1cm depth; 5.89cm^2 area and 0.589cm^3 volume. There is Fat Layer (Subcutaneous Tissue) Exposed exposed. There is no tunneling or undermining noted. There is a medium amount of serosanguineous drainage noted. The wound margin is flat and intact. There is large (67-100%) red granulation within the wound bed. There is no necrotic tissue within the wound bed. Wound #9R status is Open. Original cause of wound was Not Known. The wound is located on the Left,Distal,Lateral Lower Leg. The wound measures 0.2cm length x 0.2cm width x 0.1cm depth; 0.031cm^2 area and 0.003cm^3 volume. There is Fat Layer (Subcutaneous Tissue) Exposed exposed. There is no tunneling or undermining noted. There is a small amount of serosanguineous drainage noted. The wound margin is  flat and intact. There is large (67-100%) red granulation within the wound bed. There is no necrotic tissue within the wound bed. 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 There was a Four Layer Compression Therapy Procedure by [name], RN. Post procedure Diagnosis Wound #3R: Same as Pre-Procedure Wound #9R Pre-procedure diagnosis of Wound #9R is a Venous Leg Ulcer located on the Left,Distal,Lateral Lower Leg . There was a Four Layer Compression Therapy Procedure by [name], RN. Post procedure Diagnosis Wound #9R: Same as Pre-Procedure Plan Follow-up Appointments: Return Appointment in 1 week. Dressing  Change Frequency: Do not change entire dressing for one week. Skin Barriers/Peri-Wound Care: TCA Cream or Ointment Wound Cleansing: May shower with protection. Primary Wound Dressing: Wound #3R Right,Posterior Calf: Hydrofera Blue Wound #9R Left,Distal,Lateral Lower Leg: Hydrofera Blue Secondary Dressing: Wound #3R Right,Posterior Calf: Dry Gauze Wound #9R Left,Distal,Lateral Lower Leg: Dry Gauze Edema Control: 4 layer compression - Bilateral 1. We will go back to [facility] 2. The reopening on the posterior right calf in spite of adequately well placed and positioned compression stocking [juxta light] is disappointing. It is occurred without a lot of evidence of clinical edema. 3. In further questioning his wife stated that they had venous reflux studies done although I do not see my referencing these in my notes. Definitely had arterial studies done by Dr. [name] that showed adequate arterial flow. I will try to look up the reflux studies and any vascular consultation he had about this. 4. I am not sure what our options are going to be here. External compression pumps certainly come to mind. Thigh- high stockings however he does not have a lot of edema in his thighs and there is no reason to suspect that external compression to this level would help at least not in my view Electronic Signature(s) Signed: 08/01/2019 9:11:29 AM By: [name] MD Entered By: [name] on 08/01/2019 09:11:29 -------------------------------------------------------------------------------- SuperBill Details Patient Name: Date of Service: Armando Gang 08/01/2019 Medical Record [id number] Patient Account Number: [id number] Date of Birth/Sex: Treating RN: 09-05-1941 (78 y.o. [name]) Dolores Lory, [place] Primary Care Provider: Hurshel Party Other Clinician: Referring Provider: Treating Provider/Extender:Robson, Cheryl Flash, [place] Weeks in Treatment: 22 Diagnosis Coding ICD-10  Codes Code Description E11.51 Type 2 diabetes mellitus with diabetic peripheral angiopathy without gangrene E11.42 Type 2 diabetes mellitus with diabetic polyneuropathy L97.221 Non-pressure chronic ulcer of left calf limited to breakdown of skin I87.333 Chronic venous hypertension (idiopathic) with ulcer and inflammation of bilateral lower extremity L97.211 Non-pressure chronic ulcer of right calf limited to breakdown of skin Facility Procedures The patient participates with Medicare or their insurance follows the Medicare Facility Guidelines: CPT4 Description Modifier Quantity Code A999333 BILATERAL: Application of multi-layer venous compression system; leg 1 (below knee), including ankle and foot. Physician Procedures CPT4: Code [id number] Description: 213 - WC PHYS LEVEL 3 - EST PT ICD-10 Diagnosis Description L97.211 Non-pressure chronic ulcer of right calf limited to brea L97.221 Non-pressure chronic ulcer of left calf limited to break I87.333 Chronic venous hypertension (idiopathic)  with ulcer and lower extremity Modifier: kdown of skin down  of skin inflammation of b Quantity: 1 ilateral Electronic Signature(s) Signed: 08/01/2019 6:03:12 PM By: [name] MD Entered By: [name] on 08/01/2019 09:12:18

## 2019-09-11 ENCOUNTER — Other Ambulatory Visit: Payer: Self-pay

## 2019-09-11 ENCOUNTER — Ambulatory Visit
Admission: EM | Admit: 2019-09-11 | Discharge: 2019-09-11 | Disposition: A | Discharge status: Home / Self Care | Payer: Medicare HMO | Attending: Emergency Medicine | Admitting: Emergency Medicine

## 2019-09-11 ENCOUNTER — Ambulatory Visit: Payer: Medicare HMO | Admitting: Cardiovascular Disease

## 2019-09-11 ENCOUNTER — Telehealth (INDEPENDENT_AMBULATORY_CARE_PROVIDER_SITE_OTHER): Payer: Self-pay

## 2019-09-11 DIAGNOSIS — J42 Unspecified chronic bronchitis: Secondary | ICD-10-CM

## 2019-09-11 MED ORDER — PREDNISONE 10 MG PO TABS: 20.0000 mg | ORAL_TABLET | Freq: Every day | ORAL | 0 refills | Status: DC

## 2019-09-11 MED ORDER — AZITHROMYCIN 250 MG PO TABS: 250.0000 mg | ORAL_TABLET | Freq: Every day | ORAL | 0 refills | Status: DC

## 2019-09-11 NOTE — Telephone Encounter (Signed)
Please let the wife know that I am concerned that her husband has COVID-19 disease and she must take him to the emergency room immediately.

## 2019-09-11 NOTE — Discharge Instructions (Signed)
Advised patient to take medication as prescribed To follow-up with primary care Drink plenty of water Go to the ED for symptom worsening

## 2019-09-11 NOTE — ED Provider Notes (Signed)
[facility]    CSN: [id number] Arrival date & time: 09/11/19  1821      History   Chief Complaint Chief Complaint  Patient presents with  . Cough  . Wheezing    HPI [name] is a 78 y.o. male.   [name] 78 year old male with history recurrent chronic bronchitis presented to urgent care for complaint of cough and wheezing x3 weeks.  Patient supposed to see his primary care provider today but missed the appointment.  Denies shortness of breath, chills, fever, nausea, vomiting, chest pain, chest tightness at this time.  The history is provided by the patient. No language interpreter was used.    Past Medical History:  Diagnosis Date  . Cancer ([place])   . Chronic atrial fibrillation ([place])   . Chronic bronchitis   . Diabetes mellitus, type II ([place])    no insulin  . Gout   . History of herpes zoster virus   . History of radiation therapy 12/21/12- 02/15/13   prostate 78 gray in 40 fx  . Hyperlipidemia   . Hypertension   . Prostate cancer ([place]) 2014   EBRT + hormonal therapy  . Vitamin D deficiency disease 06/07/2019    Patient Active Problem List   Diagnosis Date Noted  . Vitamin D deficiency disease 06/07/2019  . Venous stasis ulcers ([place]) 03/15/2019  . History of radiation therapy   . Prostate cancer ([place])   . Hypertension 12/03/2010  . Chronic anticoagulation 12/03/2010  . GOUT 01/05/2010  . DM type 2 (diabetes mellitus, type 2) ([place]) 03/18/2009  . Hyperlipidemia 03/18/2009  . Atrial fibrillation ([place]) 03/18/2009    Past Surgical History:  Procedure Laterality Date  . KNEE SURGERY     left knee  . PROSTATE BIOPSY         Home Medications    Prior to Admission medications   Medication Sig Start Date End Date Taking? Authorizing Provider  acetaminophen (TYLENOL) 500 MG tablet Take 500 mg by mouth every 6 (six) hours as needed.      [provider]  allopurinol (ZYLOPRIM) 300 MG tablet TAKE 1 TABLET TWICE DAILY 07/06/19  10/04/19  Hurshel Party C, MD  ascorbic acid (VITAMIN C) 250 MG CHEW Chew 500 mg by mouth daily.    [provider]  azithromycin (ZITHROMAX) 250 MG tablet Take 1 tablet (250 mg total) by mouth daily. Take first 2 tablets together, then 1 every day until finished. 09/11/19   Freda Jaquith, Darrelyn Hillock, FNP  carvedilol (COREG) 25 MG tablet Take 1 tablet (25 mg total) by mouth 2 (two) times daily. 09/20/18 03/15/19  Arnoldo Lenis, MD  Cholecalciferol (VITAMIN D-3) 125 MCG (5000 UT) TABS Take 5,000 Units by mouth daily at 12 noon.    [provider]  diltiazem (CARDIZEM) 120 MG tablet Take 120 mg by mouth 2 (two) times daily.    [provider]  fexofenadine (ALLEGRA) 180 MG tablet Take 180 mg by mouth daily.    [provider]  guaifenesin (WAL-TUSSIN) 100 MG/5ML syrup Take 200 mg by mouth 3 (three) times daily as needed.      [provider]  lisinopril (ZESTRIL) 20 MG tablet TAKE 1 TABLET TWICE DAILY 09/02/19 12/01/19  Hurshel Party C, MD  lovastatin (MEVACOR) 40 MG tablet TAKE 1 TABLET EVERY DAY 07/06/19 10/04/19  Hurshel Party C, MD  metoprolol tartrate (LOPRESSOR) 100 MG tablet Take 50 mg by mouth 2 (two) times daily.    [provider]  NON FORMULARY Take 3 tablets by mouth daily. MANGA-CAL    [provider]  predniSONE (DELTASONE) 10 MG tablet Take 2 tablets (20 mg total) by mouth daily. 09/11/19   Brienna Bass, Darrelyn Hillock, FNP  vitamin E 400 UNIT capsule Take 1,000 Units by mouth daily.     [provider]  warfarin (COUMADIN) 5 MG tablet TAKE 1 TABLET EVERY DAY 07/06/19 10/04/19  Hurshel Party C, MD  zinc gluconate 50 MG tablet Take 50 mg by mouth daily. Takes 1/2 tablet daily    [provider]  allopurinol (ZYLOPRIM) 300 MG tablet Take 300 mg by mouth 2 (two) times daily.     [provider]  lisinopril (PRINIVIL,ZESTRIL) 20 MG tablet Take 1 tablet by mouth 2 (two) times a day. 04/26/19   Doree Albee, MD    lovastatin (MEVACOR) 40 MG tablet TAKE ONE TABLET BY MOUTH EVERY DAY AT BEDTIME 06/03/12   Yehuda Savannah, MD  warfarin (COUMADIN) 5 MG tablet Take 5 mg by mouth daily. Or as directed by anticoagulation clinic     [provider]    Family History Family History  Problem Relation Age of Onset  . Kidney failure Mother   . Heart attack Father     Social History Social History   Tobacco Use  . Smoking status: Never Smoker  . Smokeless tobacco: Never Used  Substance Use Topics  . Alcohol use: No    Alcohol/week: 0.0 standard drinks  . Drug use: No     Allergies   Clarithromycin and Fluoride preparations   Review of Systems Review of Systems  Constitutional: Negative.   HENT: Negative.   Respiratory: Positive for cough and wheezing.   Cardiovascular: Negative.   Gastrointestinal: Negative.   Neurological: Negative.   ROS: All other are negative   Physical Exam Triage Vital Signs ED Triage Vitals  Enc Vitals Group     BP 09/11/19 1848 110/73     Pulse Rate 09/11/19 1848 68     Resp 09/11/19 1848 (!) 24     Temp 09/11/19 1848 97.6 F (36.4 C)     Temp src --      SpO2 09/11/19 1848 94 %     Weight --      Height --      Head Circumference --      Peak Flow --      Pain Score 09/11/19 1845 0     Pain Loc --      Pain Edu? --      Excl. in [place]? --    No data found.  Updated Vital Signs BP 110/73   Pulse 68   Temp 97.6 F (36.4 C)   Resp (!) 24   SpO2 94%   Visual Acuity Right Eye Distance:   Left Eye Distance:   Bilateral Distance:    Right Eye Near:   Left Eye Near:    Bilateral Near:     Physical Exam Vitals and nursing note reviewed.  Constitutional:      General: He is not in acute distress.    Appearance: Normal appearance. He is normal weight.  HENT:     Head: Normocephalic.     Right Ear: Tympanic membrane, ear canal and external ear normal. There is no impacted cerumen.     Left Ear: Ear canal and external ear normal.  There is no impacted cerumen.     Nose: Nose normal. No congestion.  Mouth/Throat:     Mouth: Mucous membranes are moist.     Pharynx: No oropharyngeal exudate or posterior oropharyngeal erythema.  Cardiovascular:     Rate and Rhythm: Normal rate and regular rhythm.     Pulses: Normal pulses.     Heart sounds: Normal heart sounds. No murmur.  Pulmonary:     Effort: Pulmonary effort is normal. No respiratory distress.     Breath sounds: Wheezing present.  Chest:     Chest wall: No tenderness.  Abdominal:     General: Abdomen is flat. Bowel sounds are normal. There is no distension.     Palpations: There is no mass.  Skin:    Capillary Refill: Capillary refill takes less than 2 seconds.  Neurological:     Mental Status: He is alert and oriented to person, place, and time.      UC Treatments / Results  Labs (all labs ordered are listed, but only abnormal results are displayed) Labs Reviewed - No data to display  EKG   Radiology No results found.  Procedures Procedures (including critical care time)  Medications Ordered in UC Medications - No data to display  Initial Impression / Assessment and Plan / UC Course  I have reviewed the triage vital signs and the nursing notes.  Pertinent labs & imaging results that were available during my care of the patient were reviewed by me and considered in my medical decision making (see chart for details).     Patient stable for discharge.  Wheezing present.  patient's physical exam and Hx is consistent with bronchitis. we will  prescribe Z-Pak and prednisone.  Advised patient to follow-up with primary care provider or to return if symptoms get worse any Final Clinical Impressions(s) / UC Diagnoses   Final diagnoses:  Chronic bronchitis, unspecified chronic bronchitis type [facility])     Discharge Instructions     Advised patient to take medication as prescribed To follow-up with primary care Drink plenty of water Go to the  ED for symptom worsening    ED Prescriptions    Medication Sig Dispense Auth. Provider   predniSONE (DELTASONE) 10 MG tablet Take 2 tablets (20 mg total) by mouth daily. 15 tablet Neisha Hinger, Darrelyn Hillock, FNP   azithromycin (ZITHROMAX) 250 MG tablet Take 1 tablet (250 mg total) by mouth daily. Take first 2 tablets together, then 1 every day until finished. 6 tablet Janeliz Prestwood, Darrelyn Hillock, FNP     PDMP not reviewed this encounter.   Emerson Monte, FNP 09/11/19 1944

## 2019-09-11 NOTE — ED Triage Notes (Signed)
Pt presents with cough and wheezing for past 3 weeks

## 2019-09-11 NOTE — Telephone Encounter (Signed)
RETURN CALL AGAIN TO WIFE. SHE SAID SHE IS AT WORK AT [employer] AND SHE CANT TAKE HIM NOW; BUT SHE WILL AS SOON AS SHE IS OFF WORK.

## 2019-09-11 NOTE — Telephone Encounter (Signed)
Wife called very nervous and wheezing. Pt went to woundd center last week. staff called from wound center to be seen for breathing. Pt was given instructions to take in to the urgent care /ER  to be seen. Wif did not wantto take him there . She felt it was not needed last week.  As of today listening to voicemail wife tearful; crying. Return calls but;no answer.

## 2019-09-12 ENCOUNTER — Other Ambulatory Visit: Payer: Self-pay

## 2019-09-12 ENCOUNTER — Encounter (HOSPITAL_BASED_OUTPATIENT_CLINIC_OR_DEPARTMENT_OTHER): Payer: Medicare HMO | Admitting: Internal Medicine

## 2019-09-12 ENCOUNTER — Other Ambulatory Visit (INDEPENDENT_AMBULATORY_CARE_PROVIDER_SITE_OTHER): Payer: Self-pay | Admitting: Internal Medicine

## 2019-09-12 DIAGNOSIS — I890 Lymphedema, not elsewhere classified: Secondary | ICD-10-CM | POA: Diagnosis not present

## 2019-09-12 DIAGNOSIS — L97522 Non-pressure chronic ulcer of other part of left foot with fat layer exposed: Secondary | ICD-10-CM | POA: Diagnosis not present

## 2019-09-12 DIAGNOSIS — I87333 Chronic venous hypertension (idiopathic) with ulcer and inflammation of bilateral lower extremity: Secondary | ICD-10-CM | POA: Diagnosis not present

## 2019-09-12 DIAGNOSIS — L97822 Non-pressure chronic ulcer of other part of left lower leg with fat layer exposed: Secondary | ICD-10-CM | POA: Diagnosis not present

## 2019-09-12 DIAGNOSIS — L97211 Non-pressure chronic ulcer of right calf limited to breakdown of skin: Secondary | ICD-10-CM | POA: Diagnosis not present

## 2019-09-12 DIAGNOSIS — L97512 Non-pressure chronic ulcer of other part of right foot with fat layer exposed: Secondary | ICD-10-CM | POA: Diagnosis not present

## 2019-09-12 DIAGNOSIS — L97521 Non-pressure chronic ulcer of other part of left foot limited to breakdown of skin: Secondary | ICD-10-CM | POA: Diagnosis not present

## 2019-09-12 DIAGNOSIS — L97222 Non-pressure chronic ulcer of left calf with fat layer exposed: Secondary | ICD-10-CM | POA: Diagnosis not present

## 2019-09-12 DIAGNOSIS — E11622 Type 2 diabetes mellitus with other skin ulcer: Secondary | ICD-10-CM | POA: Diagnosis not present

## 2019-09-12 DIAGNOSIS — E11621 Type 2 diabetes mellitus with foot ulcer: Secondary | ICD-10-CM | POA: Diagnosis not present

## 2019-09-13 ENCOUNTER — Encounter: Payer: Self-pay | Admitting: Family Medicine

## 2019-09-13 ENCOUNTER — Ambulatory Visit: Payer: Medicare HMO | Admitting: Family Medicine

## 2019-09-13 VITALS — BP 136/76 | HR 95 | Temp 96.9°F | Ht 74.5 in | Wt 223.0 lb

## 2019-09-13 DIAGNOSIS — R609 Edema, unspecified: Secondary | ICD-10-CM | POA: Diagnosis not present

## 2019-09-13 DIAGNOSIS — I5022 Chronic systolic (congestive) heart failure: Secondary | ICD-10-CM

## 2019-09-13 DIAGNOSIS — I4821 Permanent atrial fibrillation: Secondary | ICD-10-CM

## 2019-09-13 DIAGNOSIS — I10 Essential (primary) hypertension: Secondary | ICD-10-CM

## 2019-09-13 MED ORDER — SPIRONOLACTONE 25 MG PO TABS: 25.0000 mg | ORAL_TABLET | Freq: Every day | ORAL | 3 refills | Status: DC

## 2019-09-13 NOTE — Progress Notes (Addendum)
Cardiology Office Note  Date: 09/13/2019   ID: [name], DOB [birth year], MRN [id number]  PCP:  Doree Albee, MD  Cardiologist:  Kate Sable, MD Electrophysiologist:  None   Chief Complaint  Patient presents with  . Follow-up    History of Present Illness: [name] is a 78 y.o. male here for 1 year follow-up.  History of permanent atrial fibrillation, chronic systolic heart failure, hypertension, hyperlipidemia, presumed nonischemic cardiomyopathy with a EF of 40 to 45%.  Recently seen March 15, 2019 by Dr. [name] for suspicion of venous stasis ulcers and evidence of venous stasis changes in addition to skin breakdown.  Skin breakdown  in the back of his calfs bilaterally.  Recently had normal ABIs per Dr. [name].  He had diminished pedal pulses bilaterally.  Patient had an intermediate risk stress study December 2019 showing a medium size defect . There were no ischemic zones per report.  EF was estimated at 43%.  Patient states he stopped taking his Lasix 3 months ago secondary to frequent urination.  He states his edema has returned and he is going to wound clinic for treatment of ulcers on the back of both calves and his legs.  He may have venous insufficiency but there have been no studies performed.  Patient states he has been having some mild dyspnea on exertion but he is not very active on a daily basis.  States he has had some issues with bronchitis recently. No progressive anginal symptoms.  Past Medical History:  Diagnosis Date  . Cancer ([place])   . CHF (congestive heart failure) ([place])   . Chronic atrial fibrillation ([place])   . Chronic bronchitis   . Diabetes mellitus, type II ([place])    no insulin  . Gout   . History of herpes zoster virus   . History of radiation therapy 12/21/12- 02/15/13   prostate 78 gray in 40 fx  . Hyperlipidemia   . Hypertension   . Prostate cancer ([place]) 2014   EBRT + hormonal therapy  . Vitamin D deficiency disease  06/07/2019    Past Surgical History:  Procedure Laterality Date  . KNEE SURGERY     left knee  . PROSTATE BIOPSY      Current Outpatient Medications  Medication Sig Dispense Refill  . acetaminophen (TYLENOL) 500 MG tablet Take 500 mg by mouth every 6 (six) hours as needed.      Marland Kitchen allopurinol (ZYLOPRIM) 300 MG tablet TAKE 1 TABLET TWICE DAILY 180 tablet 0  . ascorbic acid (VITAMIN C) 250 MG CHEW Chew 500 mg by mouth daily.    Marland Kitchen azithromycin (ZITHROMAX) 250 MG tablet Take 1 tablet (250 mg total) by mouth daily. Take first 2 tablets together, then 1 every day until finished. 6 tablet 0  . carvedilol (COREG) 25 MG tablet Take 1 tablet (25 mg total) by mouth 2 (two) times daily. 180 tablet 3  . Cholecalciferol (VITAMIN D-3) 125 MCG (5000 UT) TABS Take 5,000 Units by mouth daily at 12 noon.    . fexofenadine (ALLEGRA) 180 MG tablet Take 180 mg by mouth daily.    Marland Kitchen guaifenesin (WAL-TUSSIN) 100 MG/5ML syrup Take 200 mg by mouth 3 (three) times daily as needed.      Marland Kitchen lisinopril (ZESTRIL) 20 MG tablet TAKE 1 TABLET TWICE DAILY 180 tablet 0  . lovastatin (MEVACOR) 40 MG tablet TAKE 1 TABLET EVERY DAY 90 tablet 0  . NON FORMULARY Take 3 tablets by mouth daily.  MANGA-CAL    . predniSONE (DELTASONE) 10 MG tablet Take 2 tablets (20 mg total) by mouth daily. 15 tablet 0  . vitamin E 400 UNIT capsule Take 1,000 Units by mouth daily.     Marland Kitchen warfarin (COUMADIN) 5 MG tablet TAKE 1 TABLET EVERY DAY 90 tablet 0  . zinc gluconate 50 MG tablet Take 50 mg by mouth daily. Takes 1/2 tablet daily    . spironolactone (ALDACTONE) 25 MG tablet Take 1 tablet (25 mg total) by mouth daily. 90 tablet 3   No current facility-administered medications for this visit.   Allergies:  Clarithromycin and Fluoride preparations   Social History: The patient  reports that he has never smoked. He has never used smokeless tobacco. He reports that he does not drink alcohol or use drugs.   Family History: The patient's family  history includes Heart attack in his father; Kidney failure in his mother.   ROS:  Please see the history of present illness. Otherwise, complete review of systems is positive for none.  All other systems are reviewed and negative.   Physical Exam: VS:  BP 136/76   Pulse 95   Temp (!) 96.9 F (36.1 C)   Ht 6' 2.5" (1.892 m)   Wt 223 lb (101.2 kg)   SpO2 99%   BMI 28.25 kg/m , BMI Body mass index is 28.25 kg/m.  Wt Readings from Last 3 Encounters:  09/13/19 223 lb (101.2 kg)  06/07/19 205 lb 6.4 oz (93.2 kg)  03/15/19 204 lb (92.5 kg)    General: Patient appears comfortable at rest. Neck: Supple, no elevated JVP or carotid bruits, no thyromegaly. Lungs: Clear to auscultation, nonlabored breathing at rest. Cardiac: Irregularly irregular, no S3 or significant systolic murmur, no pericardial rub. Extremities: 1-1+ edema bilaterally edema, distal pulses 2+. Skin: Warm and dry. Neuropsychiatric: Alert and oriented x3, affect grossly appropriate.  ECG:  An ECG dated March 16, 2019 was personally reviewed today and demonstrated:  Atrial fibrillation with a rate of 83.  Moderate voltage criteria for left ventricular hypertrophy may be normal variant.  Cannot rule out septal infarct, age undetermined, ST and T wave abnormality consider inferior lateral ischemia.  Recent Labwork: 06/07/2019: ALT 42; AST 33; BUN 28; Creat 1.14; Potassium 4.5; Sodium 141     Component Value Date/Time   CHOL 196 02/13/2011 1055   TRIG 92 02/13/2011 1055   TRIG 110 01/21/2009 0000   HDL 46 02/13/2011 1055   CHOLHDL 4.3 02/13/2011 1055   VLDL 18 02/13/2011 1055   LDLCALC 132 (H) 02/13/2011 1055   Letts 152 01/21/2009 0000    Other Studies Reviewed Today:  Ankle-brachial index studies without TBI's March 29, 2019 Summary: Right: Resting right ankle-brachial index is within normal range. No evidence of significant right lower extremity arterial disease. The right toe-brachial index is normal. Although  ankle brachial indices are within normal limits (0.95-1.29), arterial Doppler waveforms at the ankle suggest some component of arterial occlusive disease. Left: Resting left ankle-brachial index is within normal range. No evidence of significant left lower extremity arterial disease. The left toe-brachial index is normal. Although ankle brachial indices are within normal limits (0.95-1.29), arterial Doppler waveforms at the ankle suggest some component of arterial occlusive disease.  Echocardiogram March 26, 2015 Impressions:  - Upper normal LV chamber size and wall thickness with LVEF   approximately 40-45%. There is diffuse hypokinesis, most   prominent in the inferoposterior wall. Indeterminate diastolic   function in the setting of  atrial fibrillation. Severe left   atrial enlargement. MAC with trivial mitral regurgitation.   Moderately sclerotic aortic valve. Mild tricuspid regurgitation   with PASP 35 mmHg, mild right atrial enlargement.  Nuclear stress test September 06, 2018  Defect 1: There is a medium defect of moderate severity present in the mid inferoseptal, mid inferior, mid inferolateral, apical septal and apical inferior location.  This is an intermediate risk study. Aforementioned defects appear to be consistent with myocardial scar (inferior and inferoseptal walls) with soft tissue attenuation also contributing. No ischemic zones.  Nuclear stress EF: 43%.  Atrial fibrillation with LBBB seen throughout study.  Assessment and Plan:  1. Permanent atrial fibrillation ([place])   2. Essential hypertension   3. Chronic systolic congestive heart failure ([place])   4. Edema, unspecified type    1. Permanent atrial fibrillation ([facility]) Heart rate is irregularly irregular at a rate of 95 today.  Continue Coreg 25 mg twice daily.  Continue Coumadin.  He denies any bleeding.  2. Essential hypertension Blood pressure within normal limits today.  Patient states blood pressures have  been doing well at PCP office.  Continue lisinopril 20 mg daily and Coreg 25 mg bid.  3.  Chronic systolic congestive heart failure ([place]) Last echocardiogram was in 2016 which showed ejection fraction of 40 to 45% with diffuse hypokinesis most prominent in the inferior posterior wall.  He had severe atrial enlargement, trivial mitral regurgitation.  Mild tricuspid regurgitation with a pulmonary artery systolic pressure of 35 mmHg.  Mild right atrial enlargement.  Patient has bilateral 1-1+ lower extremity edema.  He had stopped his Lasix approximately 4 months ago.  He has mild to moderate dyspnea on mild exertion.  Get repeat echocardiogram to assess EF and worsening pulmonary hypertension/tricuspid regurgitation.  Start Aldactone 25 mg daily.  Get BMP in 2 weeks.  Continue carvedilol 25 mg p.o. twice daily  4. Edema.  Patient continues with 1-1+ pitting edema.  He does have some ulcers on the backs of his calves.  He is visiting wound clinic for treatment.  He has seen Dr. Alvester Chou in the past.  Described patient as having venous stasis changes as well as ulcers.  His ankle-brachial indexes were within normal limits.  Patient has not been taking his Lasix as prescribed.  States he was urinating so much he is finally stopped the medication.  Medication Adjustments/Labs and Tests Ordered: Current medicines are reviewed at length with the patient today.  Concerns regarding medicines are outlined above.    Patient Instructions  Medication Instructions:  STOP METOPROLOL  STOP DILTIAZEM   ALDACTONE 25 MG DAILY   Labwork: NONE  Testing/Procedures: Your physician has requested that you have an echocardiogram. Echocardiography is a painless test that uses sound waves to create images of your heart. It provides your doctor with information about the size and shape of your heart and how well your heart's chambers and valves are working. This procedure takes approximately one hour. There are no  restrictions for this procedure.    Follow-Up: Your physician recommends that you schedule a follow-up appointment in: 4 MONTHS   Any Other Special Instructions Will Be Listed Below (If Applicable).     If you need a refill on your cardiac medications before your next appointment, please call your pharmacy.          Signed, Levell July, NP 09/13/2019 4:33 PM    [facility] [place], [place], [place] [address] Phone: [phone]; Fax: [phone]

## 2019-09-13 NOTE — Patient Instructions (Addendum)
Medication Instructions:  STOP METOPROLOL  STOP DILTIAZEM   ALDACTONE 25 MG DAILY   Labwork: NONE  Testing/Procedures: Your physician has requested that you have an echocardiogram. Echocardiography is a painless test that uses sound waves to create images of your heart. It provides your doctor with information about the size and shape of your heart and how well your heart's chambers and valves are working. This procedure takes approximately one hour. There are no restrictions for this procedure.    Follow-Up: Your physician recommends that you schedule a follow-up appointment in: 4 MONTHS   Any Other Special Instructions Will Be Listed Below (If Applicable).     If you need a refill on your cardiac medications before your next appointment, please call your pharmacy.

## 2019-09-13 NOTE — Progress Notes (Signed)
Daniel, Gross ([id number]) Visit Report for 09/12/2019 Debridement Details Patient Name: Date of Service: [name], [name] 09/12/2019 8:15 AM Medical Record [place] Patient Account Number: [id number] Date of Birth/Sex: [birth year] (78 y.o. M) Treating RN: Primary Care Provider: Hurshel Party Other Clinician: Referring Provider: Treating Provider/Extender:Robson, Cheryl Flash, [place] Weeks in Treatment: 28 Debridement Performed for Wound #15 Left,Anterior Lower Leg Assessment: Performed By: Physician Ricard Dillon., MD Debridement Type: Debridement Severity of Tissue Pre Fat layer exposed Debridement: Level of Consciousness (Pre- Awake and Alert procedure): Pre-procedure Verification/Time Out Taken: Yes - 08:56 Start Time: 08:56 Pain Control: Other : benxocaine, 20% Total Area Debrided (L x W): 3.5 (cm) x 5.5 (cm) = 19.25 (cm) Tissue and other material Viable, Non-Viable, Slough, Subcutaneous, Slough debrided: Level: Skin/Subcutaneous Tissue Debridement Description: Excisional Instrument: Curette Bleeding: Moderate Hemostasis Achieved: Pressure End Time: 08:58 Procedural Pain: 3 Post Procedural Pain: 0 Response to Treatment: Procedure was tolerated well Level of Consciousness Awake and Alert (Post-procedure): Post Debridement Measurements of Total Wound Length: (cm) 3.5 Width: (cm) 5.5 Depth: (cm) 0.1 Volume: (cm) 1.512 Character of Wound/Ulcer Post Improved Debridement: Severity of Tissue Post Debridement: Fat layer exposed Post Procedure Diagnosis Same as Pre-procedure Electronic Signature(s) Signed: 09/12/2019 5:53:46 PM By: [name] MD Entered By: [name] on 09/12/2019 09:21:40 -------------------------------------------------------------------------------- HPI Details Patient Name: Date of Service: Daniel Pia D. 09/12/2019 8:15 AM Medical Record [id number] Patient Account Number: [id number] Date of  Birth/Sex: Treating RN: 1941/04/24 (78 y.o. M) Primary Care Provider: Hurshel Party Other Clinician: Referring Provider: Treating Provider/Extender:Robson, Cheryl Flash, [place] Weeks in Treatment: 28 History of Present Illness Location: Patient presents with a wound to left lower leg. Quality: Patient reports No Pain. Duration: 2 months HPI Description: no cig or alcohol. spontaneous appearance in area of stasis dermamtitis. [name] on metformin only. chronic afib on Coumadin. diabetes and coag studies not good. hba1c 7.5. ivr 4.5. no pain or sxs of systemic disease. hx chf. no intermittent claudication 02/28/2019 Readmission This is a now a 78 year old man who was previously cared for in 2016 by Dr. Lindon Romp for wounds on his lower extremities. At that point he had venous reflux studies although I cannot seem to open these in [place] link. He had arterial studies showing an ABI of 1.11 on the right and 1.27 on the left his waveforms were triphasic bilaterally. He was discharged in stockings although I do not believe he is wearing these in some time. He tells me that about a month ago he noted openings of a large wound on the posterior right calf and 2 smaller areas on the left lateral calf and a small area more recently on the left posterior calf. He has been dressing these with peroxide and triple antibiotic ointment. He is not wearing compression. Past medical history; type 2 diabetes with peripheral neuropathy, chronic venous insufficiency, hypertension, cardiomyopathy, chronic atrial fibrillation on Coumadin, prostate cancer, hyperlipidemia, gout, ABI in our clinic was 1.34 on the left and not obtainable on the right 6/9; this is a patient who has chronic venous insufficiency. He has a fairly substantial area on the right posterior calf, left lateral calf and a small area on the left posterior calf. On arrival last week he had very palpable popliteal and femoral pulses but nothing  in his bilateral feet. Unfortunately we cannot get arterial studies until July 1 at Dr. Kennon Holter office. They live in [place]. We use silver alginate under Kerlix Coban 6/16; patient with chronic venous insufficiency with wounds on his bilateral  lower extremities. When he came into our clinic he was discovered to have a complete absence of peripheral pedal pulses at either the dorsalis pedis or posterior tibial. He does have easily palpable femoral and popliteal pulses. He sees Dr. [name] tomorrow for noninvasive arterial tests. He may also require venous reflux evaluation although I do not view this as an urgent thing. We have been using silver alginate. His wound surfaces of cleaned up quite nicely 6/23; patient with chronic venous insufficiency with wounds on his bilateral lower extremities. His wounds all are somewhat better looking. He did go to Dr. Kennon Holter office but somehow ended up on the doctors schedule rather than being scheduled for noninvasive tests therefore his noninvasive tests are scheduled for July 1. We agree that he has venous insufficiency ulcers but I cannot feel any pulses in his lower extremities dictating the need for test. We are only using Kerlix and light Coban unfortunately this appears to be holding the edema 6/30; has his arterial studies tomorrow. We have been using Kerlix and light Coban will go to a more aggressive compression if the arterial studies will allow. We all agreed these are venous wounds however I cannot feel pulses at either the dorsalis pedis or posterior tibial bilaterally. His wounds generally look some better including left lateral and right posterior. 7/7-Patient returns at 1 week in Kerlix/Coban to both legs, with improvement, in the left lateral and right posterior lower leg wounds, ABI's are normal in both legs per vascular studies, TBI is also normal on both sides, we are using hydrofera blue to the wounds 7/14; patient's arterial studies  from 2 weeks ago showed an ABI on the right at 1.03 with a TBI of 0.86. On the left the ABI was 1.06 with a TBI of 0.84. Notable for the fact that his arterial waveforms were monophasic in all of the lower extremity arteries suggesting some degree of arterial occlusive disease but in general this was felt to be fairly adequate for healing. His compression was increased from 2-3 layer which is appropriate. Dressing was changed to [facility] 7/21; patient's wounds are measuring smaller. The more substantial one on the right posterior calf, second 1 on the left lateral calf. Using [facility] on both wound areas 7/28; patient continues to make nice improvements. The area on the right posterior calf is smaller. Area on the left lateral calf also is smaller. We have been using Hydrofera Blue under compression. The patient will need compression stockings and we have measured him for these in the eventuality that these heal which really should not be too long from now 8/4-Patient continues to make improvement, the right posterior calf area smaller with rim of keratotic skin on one side, the left wound is definitely smaller and improving. 8/11-Returns at 1 week, after being in 3 layer compression on both legs, both wounds appear to be improving, making good progress, patient is happy, pain is also less especially in the right leg wound 8/18; the area on the left anterior lower leg is healed. On the right posterior leg the wound remains although the dimensions are a lot better. 8/25; he arrives in clinic today with a large body of open wound on the left lateral calf. All of the 3 wounds in this area are in close juxtaposition to each other. The story is that we discharged him last week with no a wrap on the left leg. They went to [facility] could not get in as they are only excepting  phone orders or online orders for stockings hence they did not put any stocking on the left leg all week. They have  something at home but the patient with that was either incapable or just did not put them on. Apparently these opened 1 morning after getting out of bed. The area on the right has no real change 9/1; patient has bilateral lower extremity wounds in the setting of severe chronic venous insufficiency and secondary lymphedema. He arrived last week with new areas on the left lateral lower leg after we did not wrap him and he did not use his stockings. Nevertheless the areas on the left look better today under compression. Posterior right calf does not really changed. We are using Hydrofera Blue on both areas under compression 9/15; bilateral lower extremity wounds in the setting of severe chronic venous insufficiency and secondary lymphedema. He has 20 to 30 mmHg below-knee compression stockings under the eventuality that these close over. We did get the left leg to close but he did not transition to a stocking and this reopened. There are 2 open areas on the left posterior lateral calf and one on the right. Both of these look satisfactory. Using [facility] 9/22; bilateral lower extremity wounds in the setting of chronic venous insufficiency. 2 superficial areas on the left lateral calf. One on the right just above the Achilles area. We have good edema control we have been using Hydrofera Blue 9/29; the areas on the left lateral calf are healed. On the right just above the Achilles and tendon area things look a lot better small wounds one scabbed area. We have been using Hydrofera Blue. We can discharge him in his own stocking on the left still wrapping on the right. This is the second time we have healed the left leg but he did not put a stocking on last time. Hopefully this will maintain the edema from chronic venous disease with secondary lymphedema 10/6; he comes in today having a stocking on the left leg. They had trouble getting it on there is a lot of increase in swelling 2 small open areas  one anteriorly and one on the medial calf. They report a lot of difficulty getting the stocking on. Paradoxically the area on the right that we have been wrapping posteriorly is closed 10/13; he comes in today with wounds bilaterally including superficial areas on the left medial and left lateral calf. As well as the right posterior has reopened in the Achilles area superiorly. He still does not have his juxta lite stockings although truthfully we would not of been able to use them today anyway. Apparently have been ordered and paid for from [employer] although they have not been delivered 10/20; his area on the right is just the boat closed on the right posterior. Still has the area on the left lateral and a very tiny area on the left medial. He has his bilateral juxta lites although he is not ready for them this week. He tolerated the increase to 4 layer compression last week quite well 10/27; the area on the right posterior calf is once again closed. He has a superficial area on the left lateral calf that is still open. He has been using Hydrofera Blue and bilateral 4-layer compression. He can change to his own juxta lite stocking on the right and we are instructing him today 11/3; the area on the right posterior calf reopened according to the patient and his wife after they took off the stocking  when they got home last week. Apparently scabbed over there is now a fairly substantial wound which looks pretty much the same. Our intake nurse noted that they were using the juxta lite stockings appropriately. I was really hoping I might be able to close him out today. He has 1 very tiny remaining area on the left lateral lower leg. 11/10; right posterior calf wound measures smaller but is still open. We have been using Hydrofera Blue. On the left he has a small oval-shaped wound and he seems to have had another wound distally that is open and likely a blister. We are using Hydrofera Blue under  compression 11/17; right posterior calf wound continues to get better. We have been using Hydrofera Blue. On the left lateral one of the wounds has closed still a small open area. We have been using Hydrofera Blue on this as well. Both areas have been under 4-layer compression Arrives in clinic today with some swelling in the dorsal foot on the right some erythema of his forefoot and toes. Initially when I looked at this I almost thought this was a sunburn distal to a wrap injury. 12/1; right posterior calf wound debrided with a curette. We have been using Hydrofera Blue on the left anterior lateral he has an area across the mid tibia. Finally a small area on the left lateral lower calf. Finally he continues to have de-epithelialized areas on the dorsal aspect of his toes. Initially thought this might be a burn injury when I saw him 2 weeks ago. I now wonder about tinea. I have also reviewed his arterial studies which were really quite good in July/20 with normal TBI's and ABIs but monophasic waveforms 12/8; comes in today with worsening problems especially on the left leg where he now has a cluster of wounds in the left anterior mid tibia. Very poor edema control. I reduced him to 3 layer from 4 layer compression last week because of some concern about blood flow to his toes however he does not have good edema control on the left leg. Right leg edema control looks satisfactory. On the left he has a cluster of wounds anteriorly, small area on the left medial fifth met head and then the collection of areas on his toes which appear better On the right he has the original area on the right posterior calf, a new area right medially. His formal arterial studies from mid July are noted below. He was evaluated by Dr.Berry ABI Findings: +---------+------------------+-----+----------+--------+ Right Rt Pressure (mmHg)IndexWaveform Comment   +---------+------------------+-----+----------+--------+ Brachial 176     +---------+------------------+-----+----------+--------+ ATA 176 0.99 monophasic  +---------+------------------+-----+----------+--------+ PTA 183 1.03 monophasic  +---------+------------------+-----+----------+--------+ PERO 172 0.97 monophasic  +---------+------------------+-----+----------+--------+ Great Toe153 0.86    +---------+------------------+-----+----------+--------+ +---------+------------------+-----+-----------+-------+ Left Lt Pressure (mmHg)IndexWaveform Comment +---------+------------------+-----+-----------+-------+ Brachial 178     +---------+------------------+-----+-----------+-------+ ATA 162 0.91 multiphasic  +---------+------------------+-----+-----------+-------+ PTA 188 1.06 multiphasic  +---------+------------------+-----+-----------+-------+ PERO 158 0.89 monophasic   +---------+------------------+-----+-----------+-------+ Great Toe150 0.84    +---------+------------------+-----+-----------+-------+ +-------+-----------+-----------+------------+------------+ ABI/TBIToday's ABIToday's TBIPrevious ABIPrevious TBI +-------+-----------+-----------+------------+------------+ Right 1.03 0.86 1.11   +-------+-----------+-----------+------------+------------+ Left 1.06 0.84 1.27   +-------+-----------+-----------+------------+------------+ Tibial waveforms somewhat difficult to record due to irregular heartbeat. Bilateral ABIs appear essentially unchanged compared to prior study on 06/21/15. Summary: Right: Resting right ankle-brachial index is within normal range. No evidence of significant right lower extremity arterial disease. The right toe-brachial index is normal. Although ankle brachial indices are within normal limits (0.95-1.29), arterial Doppler waveforms at the ankle suggest some component  of arterial occlusive disease. Left: Resting  left ankle-brachial index is within normal range. No evidence of significant left lower extremity arterial disease. The left toe-brachial index is normal. Although ankle brachial indices are within normal limits (0.95-1.29), arterial Doppler waveforms at the ankle suggest some component of arterial occlusive disease. 12/15; the patient's area on the left mid tibia looks better. Right posterior calf also better. He has the area on the left foot as well. All of his toes look better I think this was tinea. We are using Hydrofera Blue everywhere else The patient was in urgent care yesterday with wheezing and shortness of breath. He got azithromycin and prednisone. He feels better. His lungs are currently clear to auscultation. He was not tested for Covid 19 Electronic Signature(s) Signed: 09/12/2019 5:53:46 PM By: [name] MD Entered By: [name] on 09/12/2019 09:29:12 -------------------------------------------------------------------------------- Physical Exam Details Patient Name: Date of Service: RAYE, SLYTER 09/12/2019 8:15 AM Medical Record [id number] Patient Account Number: [id number] Date of Birth/Sex: Treating RN: 28-Sep-1941 (78 y.o. M) Primary Care Provider: Hurshel Party Other Clinician: Referring Provider: Treating Provider/Extender:Robson, Cheryl Flash, [place] Weeks in Treatment: 35 Constitutional Patient is hypertensive.. Pulse regular and within target range for patient.Marland Kitchen Respirations regular, non-labored and within target range.. Temperature is normal and within the target range for the patient.Marland Kitchen Appears in no distress. Eyes Conjunctivae clear. No discharge.no icterus. Respiratory work of breathing is normal. Bilateral breath sounds are clear and equal in all lobes with no wheezes, rales or rhonchi.. Cardiovascular Heart rhythm and rate regular, without murmur or gallop.. Integumentary (Hair,  Skin) Changes of chronic venous insufficiency. No erythema around any wound. Psychiatric appears at normal baseline. Notes Wound exam Left anterior wound area requires an aggressive debridement with a #5 curette this cleans up quite nicely hemostasis with direct pressure The original wound on the right posterior calf I think is somewhat improved in terms of surface area. We have been using Hydrofera Blue. All the areas on his toes look better Electronic Signature(s) Signed: 09/12/2019 5:53:46 PM By: [name] MD Entered By: [name] on 09/12/2019 09:26:52 -------------------------------------------------------------------------------- Physician Orders Details Patient Name: Date of Service: JANIS, CUFFE 09/12/2019 8:15 AM Medical Record [id number] Patient Account Number: [id number] Date of Birth/Sex: Treating RN: [birth year] (78 y.o. [name]) Dolores Lory, [place] Primary Care Provider: Hurshel Party Other Clinician: Referring Provider: Treating Provider/Extender:Robson, Cheryl Flash, [place] Weeks in Treatment: 22 Verbal / Phone Orders: No Diagnosis Coding ICD-10 Coding Code Description E11.51 Type 2 diabetes mellitus with diabetic peripheral angiopathy without gangrene L97.211 Non-pressure chronic ulcer of right calf limited to breakdown of skin E11.42 Type 2 diabetes mellitus with diabetic polyneuropathy L97.221 Non-pressure chronic ulcer of left calf limited to breakdown of skin I87.333 Chronic venous hypertension (idiopathic) with ulcer and inflammation of bilateral lower extremity B35.3 Tinea pedis Follow-up Appointments Return Appointment in 2 weeks. Nurse Visit: - next week Dressing Change Frequency Wound #12 Left,Lateral Lower Leg Do not change entire dressing for one week. Wound #13 Left Toe Second Change dressing every day. Wound #14 Left Toe Third Change dressing every day. Wound #15 Left,Anterior Lower Leg Do not change entire dressing for one  week. Wound #16 Left,Distal,Anterior Lower Leg Do not change entire dressing for one week. Wound #17 Right,Medial Lower Leg Do not change entire dressing for one week. Wound #18 Left,Medial Foot Do not change entire dressing for one week. Wound #3R Right,Posterior Calf Do not change entire dressing for one week. Skin Barriers/Peri-Wound Care Wound #18 Left,Medial Foot TCA Cream or Ointment Wound Cleansing May shower with  protection. Primary Wound Dressing Wound #12 Left,Lateral Lower Leg Hydrofera Blue Wound #13 Left Toe Second Other: - ketoconasole Wound #14 Left Toe Third Other: - ketoconasole Wound #15 Left,Anterior Lower Leg Hydrofera Blue Wound #16 Left,Distal,Anterior Lower Leg Hydrofera Blue Wound #17 Right,Medial Lower Leg Hydrofera Blue Wound #18 Left,Medial Foot Hydrofera Blue Wound #3R Right,Posterior Calf Hydrofera Blue Secondary Dressing Wound #12 Left,Lateral Lower Leg Dry Gauze ABD pad Wound #13 Left Toe Second Dry Gauze Wound #14 Left Toe Third Dry Gauze Wound #15 Left,Anterior Lower Leg Dry Gauze ABD pad Wound #16 Left,Distal,Anterior Lower Leg Dry Gauze ABD pad Wound #17 Right,Medial Lower Leg Dry Gauze ABD pad Wound #18 Left,Medial Foot Dry Gauze Wound #3R Right,Posterior Calf Dry Gauze ABD pad Edema Control Wound #12 Left,Lateral Lower Leg Unna Boots Bilaterally - with kerlix coban overlay Wound #15 Left,Anterior Lower Leg Unna Boots Bilaterally - with kerlix coban overlay Wound #16 Left,Distal,Anterior Lower Leg Unna Boots Bilaterally - with kerlix coban overlay Wound #17 Right,Medial Lower Leg Unna Boots Bilaterally - with kerlix coban overlay Wound #3R Right,Posterior Calf Unna Boots Bilaterally - with kerlix coban overlay Electronic Signature(s) Signed: 09/12/2019 5:53:46 PM By: [name] MD Signed: 09/13/2019 9:49:12 AM By: [name] RN Entered By: [name] on 09/12/2019  09:00:31 -------------------------------------------------------------------------------- Problem List Details Patient Name: Date of Service: Daniel Pia D. 09/12/2019 8:15 AM Medical Record [id number] Patient Account Number: [id number] Date of Birth/Sex: Treating RN: September 02, 1941 (78 y.o. [name]) Dolores Lory, [place] Primary Care Provider: Hurshel Party Other Clinician: Referring Provider: Treating Provider/Extender:Robson, Cheryl Flash, [place] Weeks in Treatment: 28 Active Problems ICD-10 Evaluated Encounter Code Description Active Date Today Diagnosis E11.51 Type 2 diabetes mellitus with diabetic peripheral 02/28/2019 No Yes angiopathy without gangrene L97.211 Non-pressure chronic ulcer of right calf limited to 02/28/2019 No Yes breakdown of skin E11.42 Type 2 diabetes mellitus with diabetic polyneuropathy 02/28/2019 No Yes L97.221 Non-pressure chronic ulcer of left calf limited to 02/28/2019 No Yes breakdown of skin I87.333 Chronic venous hypertension (idiopathic) with ulcer 02/28/2019 No Yes and inflammation of bilateral lower extremity B35.3 Tinea pedis 09/05/2019 No Yes Inactive Problems Resolved Problems Electronic Signature(s) Signed: 09/12/2019 5:53:46 PM By: [name] MD Entered By: [name] on 09/12/2019 09:20:58 -------------------------------------------------------------------------------- Progress Note Details Patient Name: Date of Service: Daniel Pia D. 09/12/2019 8:15 AM Medical Record [id number] Patient Account Number: [id number] Date of Birth/Sex: Treating RN: 04/25/1941 (78 y.o. M) Primary Care Provider: Hurshel Party Other Clinician: Referring Provider: Treating Provider/Extender:Robson, Cheryl Flash, [place] Weeks in Treatment: 28 Subjective History of Present Illness (HPI) The following HPI elements were documented for the patient's wound: Location: Patient presents with a wound to left lower leg. Quality: Patient reports No  Pain. Duration: 2 months no cig or alcohol. spontaneous appearance in area of stasis dermamtitis. [name] on metformin only. chronic afib on Coumadin. diabetes and coag studies not good. hba1c 7.5. ivr 4.5. no pain or sxs of systemic disease. hx chf. no intermittent claudication 02/28/2019 Readmission This is a now a 78 year old man who was previously cared for in 2016 by Dr. Lindon Romp for wounds on his lower extremities. At that point he had venous reflux studies although I cannot seem to open these in [place] link. He had arterial studies showing an ABI of 1.11 on the right and 1.27 on the left his waveforms were triphasic bilaterally. He was discharged in stockings although I do not believe he is wearing these in some time. He tells me that about a month ago he noted openings of a large  wound on the posterior right calf and 2 smaller areas on the left lateral calf and a small area more recently on the left posterior calf. He has been dressing these with peroxide and triple antibiotic ointment. He is not wearing compression. Past medical history; type 2 diabetes with peripheral neuropathy, chronic venous insufficiency, hypertension, cardiomyopathy, chronic atrial fibrillation on Coumadin, prostate cancer, hyperlipidemia, gout, ABI in our clinic was 1.34 on the left and not obtainable on the right 6/9; this is a patient who has chronic venous insufficiency. He has a fairly substantial area on the right posterior calf, left lateral calf and a small area on the left posterior calf. On arrival last week he had very palpable popliteal and femoral pulses but nothing in his bilateral feet. Unfortunately we cannot get arterial studies until July 1 at Dr. Kennon Holter office. They live in [place]. We use silver alginate under Kerlix Coban 6/16; patient with chronic venous insufficiency with wounds on his bilateral lower extremities. When he came into our clinic he was discovered to have a complete absence of  peripheral pedal pulses at either the dorsalis pedis or posterior tibial. He does have easily palpable femoral and popliteal pulses. He sees Dr. [name] tomorrow for noninvasive arterial tests. He may also require venous reflux evaluation although I do not view this as an urgent thing. We have been using silver alginate. His wound surfaces of cleaned up quite nicely 6/23; patient with chronic venous insufficiency with wounds on his bilateral lower extremities. His wounds all are somewhat better looking. He did go to Dr. Kennon Holter office but somehow ended up on the doctors schedule rather than being scheduled for noninvasive tests therefore his noninvasive tests are scheduled for July 1. We agree that he has venous insufficiency ulcers but I cannot feel any pulses in his lower extremities dictating the need for test. We are only using Kerlix and light Coban unfortunately this appears to be holding the edema 6/30; has his arterial studies tomorrow. We have been using Kerlix and light Coban will go to a more aggressive compression if the arterial studies will allow. We all agreed these are venous wounds however I cannot feel pulses at either the dorsalis pedis or posterior tibial bilaterally. His wounds generally look some better including left lateral and right posterior. 7/7-Patient returns at 1 week in Kerlix/Coban to both legs, with improvement, in the left lateral and right posterior lower leg wounds, ABI's are normal in both legs per vascular studies, TBI is also normal on both sides, we are using hydrofera blue to the wounds 7/14; patient's arterial studies from 2 weeks ago showed an ABI on the right at 1.03 with a TBI of 0.86. On the left the ABI was 1.06 with a TBI of 0.84. Notable for the fact that his arterial waveforms were monophasic in all of the lower extremity arteries suggesting some degree of arterial occlusive disease but in general this was felt to be fairly adequate for healing. His  compression was increased from 2-3 layer which is appropriate. Dressing was changed to [facility] 7/21; patient's wounds are measuring smaller. The more substantial one on the right posterior calf, second 1 on the left lateral calf. Using [facility] on both wound areas 7/28; patient continues to make nice improvements. The area on the right posterior calf is smaller. Area on the left lateral calf also is smaller. We have been using Hydrofera Blue under compression. The patient will need compression stockings and we have measured him for  these in the eventuality that these heal which really should not be too long from now 8/4-Patient continues to make improvement, the right posterior calf area smaller with rim of keratotic skin on one side, the left wound is definitely smaller and improving. 8/11-Returns at 1 week, after being in 3 layer compression on both legs, both wounds appear to be improving, making good progress, patient is happy, pain is also less especially in the right leg wound 8/18; the area on the left anterior lower leg is healed. On the right posterior leg the wound remains although the dimensions are a lot better. 8/25; he arrives in clinic today with a large body of open wound on the left lateral calf. All of the 3 wounds in this area are in close juxtaposition to each other. The story is that we discharged him last week with no a wrap on the left leg. They went to [place] could not get in as they are only excepting phone orders or online orders for stockings hence they did not put any stocking on the left leg all week. They have something at home but the patient with that was either incapable or just did not put them on. Apparently these opened 1 morning after getting out of bed. The area on the right has no real change 9/1; patient has bilateral lower extremity wounds in the setting of severe chronic venous insufficiency and secondary lymphedema. He arrived last week  with new areas on the left lateral lower leg after we did not wrap him and he did not use his stockings. Nevertheless the areas on the left look better today under compression. Posterior right calf does not really changed. We are using Hydrofera Blue on both areas under compression 9/15; bilateral lower extremity wounds in the setting of severe chronic venous insufficiency and secondary lymphedema. He has 20 to 30 mmHg below-knee compression stockings under the eventuality that these close over. We did get the left leg to close but he did not transition to a stocking and this reopened. There are 2 open areas on the left posterior lateral calf and one on the right. Both of these look satisfactory. Using [facility] 9/22; bilateral lower extremity wounds in the setting of chronic venous insufficiency. 2 superficial areas on the left lateral calf. One on the right just above the Achilles area. We have good edema control we have been using Hydrofera Blue 9/29; the areas on the left lateral calf are healed. On the right just above the Achilles and tendon area things look a lot better small wounds one scabbed area. We have been using Hydrofera Blue. We can discharge him in his own stocking on the left still wrapping on the right. This is the second time we have healed the left leg but he did not put a stocking on last time. Hopefully this will maintain the edema from chronic venous disease with secondary lymphedema 10/6; he comes in today having a stocking on the left leg. They had trouble getting it on there is a lot of increase in swelling 2 small open areas one anteriorly and one on the medial calf. They report a lot of difficulty getting the stocking on. ooParadoxically the area on the right that we have been wrapping posteriorly is closed 10/13; he comes in today with wounds bilaterally including superficial areas on the left medial and left lateral calf. As well as the right posterior has  reopened in the Achilles area superiorly. He still does not have  his juxta lite stockings although truthfully we would not of been able to use them today anyway. Apparently have been ordered and paid for from [employer] although they have not been delivered 10/20; his area on the right is just the boat closed on the right posterior. Still has the area on the left lateral and a very tiny area on the left medial. He has his bilateral juxta lites although he is not ready for them this week. He tolerated the increase to 4 layer compression last week quite well 10/27; the area on the right posterior calf is once again closed. He has a superficial area on the left lateral calf that is still open. He has been using Hydrofera Blue and bilateral 4-layer compression. He can change to his own juxta lite stocking on the right and we are instructing him today 11/3; the area on the right posterior calf reopened according to the patient and his wife after they took off the stocking when they got home last week. Apparently scabbed over there is now a fairly substantial wound which looks pretty much the same. Our intake nurse noted that they were using the juxta lite stockings appropriately. I was really hoping I might be able to close him out today. He has 1 very tiny remaining area on the left lateral lower leg. 11/10; right posterior calf wound measures smaller but is still open. We have been using Hydrofera Blue. On the left he has a small oval-shaped wound and he seems to have had another wound distally that is open and likely a blister. We are using Hydrofera Blue under compression 11/17; right posterior calf wound continues to get better. We have been using Hydrofera Blue. On the left lateral one of the wounds has closed still a small open area. We have been using Hydrofera Blue on this as well. Both areas have been under 4-layer compression Arrives in clinic today with some swelling in the dorsal foot on the  right some erythema of his forefoot and toes. Initially when I looked at this I almost thought this was a sunburn distal to a wrap injury. 12/1; right posterior calf wound debrided with a curette. We have been using Hydrofera Blue on the left anterior lateral he has an area across the mid tibia. Finally a small area on the left lateral lower calf. Finally he continues to have de-epithelialized areas on the dorsal aspect of his toes. Initially thought this might be a burn injury when I saw him 2 weeks ago. I now wonder about tinea. I have also reviewed his arterial studies which were really quite good in July/20 with normal TBI's and ABIs but monophasic waveforms 12/8; comes in today with worsening problems especially on the left leg where he now has a cluster of wounds in the left anterior mid tibia. Very poor edema control. I reduced him to 3 layer from 4 layer compression last week because of some concern about blood flow to his toes however he does not have good edema control on the left leg. Right leg edema control looks satisfactory. ooOn the left he has a cluster of wounds anteriorly, small area on the left medial fifth met head and then the collection of areas on his toes which appear better ooOn the right he has the original area on the right posterior calf, a new area right medially. His formal arterial studies from mid July are noted below. He was evaluated by Dr.Berry ABI Findings: +---------+------------------+-----+----------+--------+ Right Rt Pressure (mmHg)IndexWaveform Comment  +---------+------------------+-----+----------+--------+  Brachial 176     +---------+------------------+-----+----------+--------+ ATA 176 0.99 monophasic  +---------+------------------+-----+----------+--------+ PTA 183 1.03 monophasic  +---------+------------------+-----+----------+--------+ PERO 172 0.97 monophasic   +---------+------------------+-----+----------+--------+ Great Toe153 0.86    +---------+------------------+-----+----------+--------+ +---------+------------------+-----+-----------+-------+ Left Lt Pressure (mmHg)IndexWaveform Comment +---------+------------------+-----+-----------+-------+ Brachial 178     +---------+------------------+-----+-----------+-------+ ATA 162 0.91 multiphasic  +---------+------------------+-----+-----------+-------+ PTA 188 1.06 multiphasic  +---------+------------------+-----+-----------+-------+ PERO 158 0.89 monophasic   +---------+------------------+-----+-----------+-------+ Great Toe150 0.84    +---------+------------------+-----+-----------+-------+ +-------+-----------+-----------+------------+------------+ ABI/TBIToday's ABIToday's TBIPrevious ABIPrevious TBI +-------+-----------+-----------+------------+------------+ Right 1.03 0.86 1.11   +-------+-----------+-----------+------------+------------+ Left 1.06 0.84 1.27   +-------+-----------+-----------+------------+------------+ Tibial waveforms somewhat difficult to record due to irregular heartbeat. Bilateral ABIs appear essentially unchanged compared to prior study on 06/21/15. Summary: Right: Resting right ankle-brachial index is within normal range. No evidence of significant right lower extremity arterial disease. The right toe-brachial index is normal. Although ankle brachial indices are within normal limits (0.95-1.29), arterial Doppler waveforms at the ankle suggest some component of arterial occlusive disease. Left: Resting left ankle-brachial index is within normal range. No evidence of significant left lower extremity arterial disease. The left toe-brachial index is normal. Although ankle brachial indices are within normal limits (0.95-1.29), arterial Doppler waveforms at the ankle suggest some component of arterial  occlusive disease. 12/15; the patient's area on the left mid tibia looks better. Right posterior calf also better. He has the area on the left foot as well. All of his toes look better I think this was tinea. We are using Hydrofera Blue everywhere else Objective Constitutional Patient is hypertensive.. Pulse regular and within target range for patient.Marland Kitchen Respirations regular, non-labored and within target range.. Temperature is normal and within the target range for the patient.Marland Kitchen Appears in no distress. Vitals Time Taken: 8:15 AM, Height: 74 in, Weight: 212 lbs, BMI: 27.2, Temperature: 97.5 F, Pulse: 53 bpm, Respiratory Rate: 20 breaths/min, Blood Pressure: 155/100 mmHg. Eyes Conjunctivae clear. No discharge.no icterus. Respiratory work of breathing is normal. Bilateral breath sounds are clear and equal in all lobes with no wheezes, rales or rhonchi.. Cardiovascular Heart rhythm and rate regular, without murmur or gallop.Marland Kitchen Psychiatric appears at normal baseline. General Notes: Wound exam ooLeft anterior wound area requires an aggressive debridement with a #5 curette this cleans up quite nicely hemostasis with direct pressure ooThe original wound on the right posterior calf I think is somewhat improved in terms of surface area. We have been using Hydrofera Blue. ooAll the areas on his toes look better Integumentary (Hair, Skin) Changes of chronic venous insufficiency. No erythema around any wound. Wound #12 status is Open. Original cause of wound was Blister. The wound is located on the Left,Lateral Lower Leg. The wound measures 0cm length x 0cm width x 0cm depth; 0cm^2 area and 0cm^3 volume. There is no tunneling or undermining noted. There is a small amount of serous drainage noted. The wound margin is flat and intact. There is no granulation within the wound bed. There is no necrotic tissue within the wound bed. Wound #13 status is Open. Original cause of wound was Gradually  Appeared. The wound is located on the Left Toe Second. The wound measures 1.1cm length x 0.9cm width x 0.1cm depth; 0.778cm^2 area and 0.078cm^3 volume. There is Fat Layer (Subcutaneous Tissue) Exposed exposed. There is no tunneling or undermining noted. There is a medium amount of serosanguineous drainage noted. The wound margin is distinct with the outline attached to the wound base. There is large (67-100%) red, pink granulation within the wound bed. There is a small (1-33%) amount of necrotic tissue within  the wound bed including Adherent Slough. Wound #14 status is Open. Original cause of wound was Gradually Appeared. The wound is located on the Left Toe Third. The wound measures 0.3cm length x 0.3cm width x 0.1cm depth; 0.071cm^2 area and 0.007cm^3 volume. There is Fat Layer (Subcutaneous Tissue) Exposed exposed. There is no tunneling or undermining noted. There is a medium amount of serosanguineous drainage noted. The wound margin is distinct with the outline attached to the wound base. There is large (67-100%) red, pink granulation within the wound bed. There is a small (1-33%) amount of necrotic tissue within the wound bed including Adherent Slough. Wound #15 status is Open. Original cause of wound was Blister. The wound is located on the Left,Anterior Lower Leg. The wound measures 3.5cm length x 5.5cm width x 0.1cm depth; 15.119cm^2 area and 1.512cm^3 volume. There is Fat Layer (Subcutaneous Tissue) Exposed exposed. There is no tunneling or undermining noted. There is a medium amount of serous drainage noted. The wound margin is distinct with the outline attached to the wound base. There is medium (34-66%) red granulation within the wound bed. There is a medium (34-66%) amount of necrotic tissue within the wound bed including Adherent Slough. Wound #16 status is Open. Original cause of wound was Blister. The wound is located on the [facility] Lower Leg. The wound measures 0.5cm  length x 0.5cm width x 0.1cm depth; 0.196cm^2 area and 0.02cm^3 volume. There is Fat Layer (Subcutaneous Tissue) Exposed exposed. There is no tunneling or undermining noted. There is a medium amount of serous drainage noted. The wound margin is distinct with the outline attached to the wound base. There is medium (34-66%) granulation within the wound bed. There is a medium (34-66%) amount of necrotic tissue within the wound bed including Adherent Slough. Wound #17 status is Open. Original cause of wound was Blister. The wound is located on the Right,Medial Lower Leg. The wound measures 0.5cm length x 0.5cm width x 0.5cm depth; 0.196cm^2 area and 0.098cm^3 volume. There is Fat Layer (Subcutaneous Tissue) Exposed exposed. There is no tunneling or undermining noted. There is a medium amount of serous drainage noted. The wound margin is distinct with the outline attached to the wound base. There is large (67-100%) pink granulation within the wound bed. There is a small (1-33%) amount of necrotic tissue within the wound bed including Adherent Slough. Wound #18 status is Open. Original cause of wound was Gradually Appeared. The wound is located on the Left,Medial Foot. The wound measures 1cm length x 1.3cm width x 0.1cm depth; 1.021cm^2 area and 0.102cm^3 volume. There is no tunneling or undermining noted. There is a medium amount of serosanguineous drainage noted. The wound margin is flat and intact. There is large (67-100%) red, pink granulation within the wound bed. There is no necrotic tissue within the wound bed. Wound #3R status is Open. Original cause of wound was Gradually Appeared. The wound is located on the Right,Posterior Calf. The wound measures 2cm length x 1.7cm width x 0.1cm depth; 2.67cm^2 area and 0.267cm^3 volume. There is Fat Layer (Subcutaneous Tissue) Exposed exposed. There is no tunneling or undermining noted. There is a medium amount of serous drainage noted. The wound margin is  distinct with the outline attached to the wound base. There is medium (34-66%) red, pink, pale granulation within the wound bed. There is a medium (34- 66%) amount of necrotic tissue within the wound bed including Adherent Slough. 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 There was a Excisional Skin/Subcutaneous Tissue Debridement with a total area of 19.25 sq cm performed by Ricard Dillon., MD. With the following instrument(s): Curette to remove Viable and Non-Viable tissue/material. Material removed includes Subcutaneous Tissue and Slough and after achieving pain control using Other (benxocaine, 20%). No specimens were taken. A time out was conducted at 08:56, prior to the start of the procedure. A Moderate amount of bleeding was controlled with Pressure. The procedure was tolerated well with a pain level of 3 throughout and a pain level of 0 following the procedure. Post Debridement Measurements: 3.5cm length x 5.5cm width x 0.1cm depth; 1.512cm^3 volume. Character of Wound/Ulcer Post Debridement is improved. Severity of Tissue Post Debridement is: Fat layer exposed. Post procedure Diagnosis Wound #15: Same as Pre-Procedure Plan Follow-up Appointments: Return Appointment in 2 weeks. Nurse Visit: - next week Dressing Change Frequency: Wound #12 Left,Lateral Lower Leg: Do not change entire dressing for one  week. Wound #13 Left Toe Second: Change dressing every day. Wound #14 Left Toe Third: Change dressing every day. Wound #15 Left,Anterior Lower Leg: Do not change entire dressing for one week. Wound #16 Left,Distal,Anterior Lower Leg: Do not change entire dressing for one week. Wound #17 Right,Medial Lower Leg: Do not change entire dressing for one week. Wound #18 Left,Medial Foot: Do not change entire dressing for one week. Wound #3R Right,Posterior Calf: Do not change entire dressing for one week. Skin Barriers/Peri-Wound Care: Wound #18 Left,Medial Foot: TCA Cream or Ointment Wound Cleansing: May shower with protection. Primary Wound Dressing: Wound #12 Left,Lateral Lower Leg: Hydrofera Blue Wound #13 Left Toe Second: Other: - ketoconasole Wound #14 Left Toe Third: Other: - ketoconasole Wound #15 Left,Anterior Lower Leg: Hydrofera Blue Wound #16 Left,Distal,Anterior Lower Leg: Hydrofera Blue Wound #17 Right,Medial Lower Leg: Hydrofera Blue Wound #18 Left,Medial Foot: Hydrofera Blue Wound #3R Right,Posterior Calf: Hydrofera Blue Secondary Dressing: Wound #12 Left,Lateral Lower Leg: Dry Gauze ABD pad Wound #13 Left Toe Second: Dry Gauze Wound #14 Left Toe Third: Dry Gauze Wound #15 Left,Anterior Lower Leg: Dry Gauze ABD pad Wound #16 Left,Distal,Anterior Lower Leg: Dry Gauze ABD pad Wound #17 Right,Medial Lower Leg: Dry Gauze ABD pad Wound #18 Left,Medial Foot: Dry Gauze Wound #3R Right,Posterior Calf: Dry Gauze ABD pad Edema Control: Wound #12 Left,Lateral Lower Leg: Unna Boots Bilaterally - with kerlix coban overlay Wound #15 Left,Anterior Lower Leg: Unna Boots Bilaterally - with kerlix coban overlay Wound #16 Left,Distal,Anterior Lower Leg: Unna Boots Bilaterally - with kerlix coban overlay Wound #17 Right,Medial Lower Leg: Unna Boots Bilaterally - with kerlix coban overlay Wound #3R Right,Posterior Calf: Unna Boots Bilaterally - with kerlix  coban overlay 1. Continue with antifungal treatment to the toes for tinea pedis 2. Hydrofera Blue to the area on the left anterior major wound with a small satellite. Right posterior calf as well. Electronic Signature(s) Signed: 09/12/2019 5:53:46 PM By: [name] MD Entered By: [name] on 09/12/2019 09:28:12 -------------------------------------------------------------------------------- SuperBill Details Patient Name: Date of Service: WARNIE, BELAIR 09/12/2019 Medical Record [id number] Patient Account Number: [id number] Date of Birth/Sex: Treating RN:  [id number] (78 y.o. M) Primary Care Provider: Hurshel Party Other Clinician: Referring Provider: Treating Provider/Extender:Robson, Cheryl Flash, [place] Weeks in Treatment: 28 Diagnosis Coding ICD-10 Codes Code Description E11.51 Type 2 diabetes mellitus with diabetic peripheral angiopathy without gangrene L97.211 Non-pressure chronic ulcer of right calf limited to breakdown of skin E11.42 Type 2 diabetes mellitus with diabetic polyneuropathy L97.221 Non-pressure chronic ulcer of left calf limited to breakdown of skin I87.333 Chronic venous hypertension (idiopathic) with ulcer and inflammation of bilateral lower extremity B35.3 Tinea pedis Facility Procedures The patient participates with Medicare or their insurance follows the Medicare Facility Guidelines: CPT4 Code Description Modifier Quantity [id number] 11042 - DEB SUBQ TISSUE 20 SQ CM/< 1 ICD-10 Diagnosis Description L97.221 Non-pressure chronic ulcer of  left calf limited to breakdown of skin Physician Procedures CPT4 Code Description: [id number] 11042 - WC PHYS SUBQ TISS 20 SQ CM ICD-10 Diagnosis Description L97.221 Non-pressure chronic ulcer of left calf limited to breakdown Modifier: of skin Quantity: 1 Electronic Signature(s) Signed: 09/12/2019 5:53:46 PM By: [name] MD Entered By: [name] on 09/12/2019 09:28:35

## 2019-09-14 ENCOUNTER — Telehealth: Payer: Self-pay

## 2019-09-14 ENCOUNTER — Encounter (INDEPENDENT_AMBULATORY_CARE_PROVIDER_SITE_OTHER): Payer: Medicare HMO | Admitting: Internal Medicine

## 2019-09-14 MED ORDER — PREDNISONE 10 MG PO TABS: 20.0000 mg | ORAL_TABLET | Freq: Every day | ORAL | 0 refills | Status: DC

## 2019-09-14 NOTE — Telephone Encounter (Signed)
Fax received from [facility] in [place] stating that pt had lost the last prescription of prednisone. Pt is called by this nurse to ask about what happened. Call went to voicemail. VM left stating to call this [facility] back. Per Lestine Box, [place], prednisone prescription resent to [facility] in [place].

## 2019-09-14 NOTE — Progress Notes (Signed)
[name], [name] ([id number]) Visit Report for 09/12/2019 Arrival Information Details Patient Name: Date of Service: IRFAN, VEAL 09/12/2019 8:15 AM Medical Record [place] Patient Account Number: [id number] Date of Birth/Sex: Treating RN: 10/18/1940 (78 y.o. [name] Primary Care Yi Falletta: [place]hel Party Other Clinician: Referring Lakeidra Reliford: Treating Kala Ambriz/Extender:Robson, Cheryl Flash, [place] Weeks in Treatment: 28 Visit Information History Since Last Visit Added or deleted any medications: No Patient Arrived: Ambulatory Any new allergies or adverse reactions: No Arrival Time: 08:19 Had a fall or experienced change in No Accompanied By: self activities of daily living that may affect Transfer Assistance: None risk of falls: Patient Identification Verified: Yes Signs or symptoms of abuse/neglect since last No Secondary Verification Process Yes visito Completed: Hospitalized since last visit: No Patient Requires Transmission- No Implantable device outside of the clinic excluding No Based Precautions: cellular tissue based products placed in the center Patient Has Alerts: Yes since last visit: Patient Alerts: Patient on Blood Has Dressing in Place as Prescribed: Yes Thinner Has Compression in Place as Prescribed: Yes Pain Present Now: No Electronic Signature(s) Signed: 09/14/2019 5:33:40 PM By: Kela Millin Entered By: Kela Millin on 09/12/2019 08:19:49 -------------------------------------------------------------------------------- Compression Therapy Details Patient Name: Date of Service: JACORIE, ERNSBERGER 09/12/2019 8:15 AM Medical Record [id number] Patient Account Number: [id number] Date of Birth/Sex: Treating RN: 03-Aug-1941 (78 y.o. [name]) [name] Primary Care Demorris Choyce: Hurshel Party Other Clinician: Referring Megan Hayduk: Treating Shawnese Magner/Extender:Robson, Cheryl Flash, [place] Weeks in Treatment:  28 Compression Therapy Performed for Wound Wound #12 Left,Lateral Lower Leg Assessment: Performed By: Jake Church, RN Compression Type: Rolena Infante Post Procedure Diagnosis Same as Pre-procedure Electronic Signature(s) Signed: 09/13/2019 9:49:12 AM By: [name] RN Entered By: [name] on 09/12/2019 14:00:37 -------------------------------------------------------------------------------- Compression Therapy Details Patient Name: Date of Service: NICKHOLAS, GOLDSTON 09/12/2019 8:15 AM Medical Record [id number] Patient Account Number: [id number] Date of Birth/Sex: Treating RN: 1941/02/16 (78 y.o. [name]) [name] Primary Care Shelbie Franken: H[place]hel Party Other Clinician: Referring Kannon Baum: Treating Ilayda Toda/Extender:Robson, Cheryl Flash, [place] Weeks in Treatment: 28 Compression Therapy Performed for Wound Wound #13 Left Toe Second Assessment: Performed By: Jake Church, RN Compression Type: Rolena Infante Post Procedure Diagnosis Same as Pre-procedure Electronic Signature(s) Signed: 09/13/2019 9:49:12 AM By: [name] RN Entered By: [name] on 09/12/2019 14:00:37 -------------------------------------------------------------------------------- Compression Therapy Details Patient Name: Date of Service: LEAM, MADERO 09/12/2019 8:15 AM Medical Record [id number] Patient Account Number: [id number] Date of Birth/Sex: Treating RN: [birth year] (78 y.o. [name]) [name] Primary Care Gisel Vipond: [place]hel Party Other Clinician: Referring Lorie Cleckley: Treating Raveen Wieseler/Extender:Robson, Cheryl Flash, [place] Weeks in Treatment: 28 Compression Therapy Performed for Wound Wound #14 Left Toe Third Assessment: Performed By: Jake Church, RN Compression Type: Rolena Infante Post Procedure Diagnosis Same as Pre-procedure Electronic Signature(s) Signed: 09/13/2019 9:49:12 AM By: [name] RN Entered By: [name] on 09/12/2019  14:00:37 -------------------------------------------------------------------------------- Compression Therapy Details Patient Name: Date of Service: TYREESE, THAIN 09/12/2019 8:15 AM Medical Record [id number] Patient Account Number: [id number] Date of Birth/Sex: Treating RN: 04-28-1941 (78 y.o. [name]) [name] Primary Care Megon Kalina: H[place]hel Party Other Clinician: Referring Syvanna Ciolino: Treating Cartha Rotert/Extender:Robson, Cheryl Flash, [place] Weeks in Treatment: 28 Compression Therapy Performed for Wound Wound #15 Left,Anterior Lower Leg Assessment: Performed By: Jake Church, RN Compression Type: Rolena Infante Post Procedure Diagnosis Same as Pre-procedure Electronic Signature(s) Signed: 09/13/2019 9:49:12 AM By: [name] RN Entered By: [name] on 09/12/2019 14:00:37 -------------------------------------------------------------------------------- Compression Therapy Details Patient Name: Date of Service: MARWIN, PRIMMER. 09/12/2019 8:15 AM Medical Record [id number] Patient  Account Number: [id number] Date of Birth/Sex: Treating RN: 1941/04/08 (78 y.o. [name]) [name] Primary Care Alistar Mcenery: Hurshel Party Other Clinician: Referring Mio Schellinger: Treating Vernette Moise/Extender:Robson, Cheryl Flash, [name] Weeks in Treatment: 28 Compression Therapy Performed for Wound Wound #16 Left,Distal,Anterior Lower Leg Assessment: Performed By: Jake Church, RN Compression Type: Rolena Infante Post Procedure Diagnosis Same as Pre-procedure Electronic Signature(s) Signed: 09/13/2019 9:49:12 AM By: [name] RN Entered By: [name] on 09/12/2019 14:00:38 -------------------------------------------------------------------------------- Compression Therapy Details Patient Name: Date of Service: JEFFREN, DOMBEK 09/12/2019 8:15 AM Medical Record [id number] Patient Account Number: [id number] Date of Birth/Sex: Treating RN: 01/02/1941 (78 y.o.  [name]) [name] Primary Care Fabiano Ginley: H[place]hel Party Other Clinician: Referring Brnadon Eoff: Treating Tashonda Pinkus/Extender:Robson, Cheryl Flash, [place] Weeks in Treatment: 28 Compression Therapy Performed for Wound Wound #17 Right,Medial Lower Leg Assessment: Performed By: Jake Church, RN Compression Type: Rolena Infante Post Procedure Diagnosis Same as Pre-procedure Electronic Signature(s) Signed: 09/13/2019 9:49:12 AM By: [name] RN Entered By: [name] on 09/12/2019 14:00:38 -------------------------------------------------------------------------------- Compression Therapy Details Patient Name: Date of Service: SHADRICK, SENNE 09/12/2019 8:15 AM Medical Record [id number] Patient Account Number: [id number] Date of Birth/Sex: Treating RN: September 10, 1941 (78 y.o. [name]) [name] Primary Care Mazelle Huebert: H[place]hel Party Other Clinician: Referring Filiberto Wamble: Treating Alva Broxson/Extender:Robson, Cheryl Flash, [place] Weeks in Treatment: 28 Compression Therapy Performed for Wound Wound #18 Left,Medial Foot Assessment: Performed By: Jake Church, RN Compression Type: Rolena Infante Post Procedure Diagnosis Same as Pre-procedure Electronic Signature(s) Signed: 09/13/2019 9:49:12 AM By: [name] RN Entered By: [name] on 09/12/2019 14:00:38 -------------------------------------------------------------------------------- Compression Therapy Details Patient Name: Date of Service: RONIN, REHFELDT 09/12/2019 8:15 AM Medical Record [id number] Patient Account Number: [id number] Date of Birth/Sex: Treating RN: 1940/10/24 (78 y.o. [name]) [name] Primary Care Naaman Curro: H[place]hel Party Other Clinician: Referring Melissa Pulido: Treating Elyana Grabski/Extender:Robson, Cheryl Flash, [place] Weeks in Treatment: 28 Compression Therapy Performed for Wound Wound #3R Right,Posterior Calf Assessment: Performed By: Jake Church, RN Compression Type:  Rolena Infante Post Procedure Diagnosis Same as Pre-procedure Electronic Signature(s) Signed: 09/13/2019 9:49:12 AM By: [name] RN Entered By: [name] on 09/12/2019 14:00:38 -------------------------------------------------------------------------------- Encounter Discharge Information Details Patient Name: Date of Service: FERDIE, BAKKEN 09/12/2019 8:15 AM Medical Record [id number] Patient Account Number: [id number] Date of Birth/Sex: Treating RN: 1940-12-23 (78 y.o. [name] Primary Care Kashmir Lysaght: Hurshel Party Other Clinician: Referring Ethan Clayburn: Treating Ikesha Siller/Extender:Robson, Cheryl Flash, [place] Weeks in Treatment: 32 Encounter Discharge Information Items Post Procedure Vitals Discharge Condition: Stable Temperature (F): 97.5 Ambulatory Status: Ambulatory Pulse (bpm): 62 Discharge Destination: Home Respiratory Rate (breaths/min): 20 Transportation: Private Auto Blood Pressure (mmHg): 173/87 Accompanied By: wife Schedule Follow-up Appointment: Yes Clinical Summary of Care: Patient Declined Notes MD notified of BP. Electronic Signature(s) Signed: 09/14/2019 5:33:40 PM By: Kela Millin Entered By: Kela Millin on 09/12/2019 10:16:33 -------------------------------------------------------------------------------- Lower Extremity Assessment Details Patient Name: Date of Service: HARU, SHAFF 09/12/2019 8:15 AM Medical Record [id number] Patient Account Number: [id number] Date of Birth/Sex: Treating RN: Sep 15, 1941 (78 y.o. [name] Primary Care Odyssey Vasbinder: H[place]hel Party Other Clinician: Referring Cuong Moorman: Treating Madalee Altmann/Extender:Robson, Cheryl Flash, [place] Weeks in Treatment: 28 Edema Assessment Assessed: [Left: No] [Right: No] Edema: [Left: Yes] [Right: Yes] Calf Left: Right: Point of Measurement: 31 cm From Medial Instep 37 cm 32 cm Ankle Left: Right: Point of Measurement: 11 cm From Medial  Instep 22 cm 21 cm Vascular Assessment Pulses: Dorsalis Pedis Palpable: [Left:Yes] [Right:Yes] Electronic Signature(s) Signed: 09/14/2019 5:33:40 PM By: Kela Millin Entered By: Kela Millin on 09/12/2019 08:29:27 --------------------------------------------------------------------------------  Multi Wound Chart Details Patient Name: Date of Service: HUDSON, MAJKOWSKI 09/12/2019 8:15 AM Medical Record [place] Patient Account Number: [id number] Date of Birth/Sex: Treating RN: 09-12-1941 (78 y.o. M) Primary Care Ralph Brouwer: Hurshel Party Other Clinician: Referring Gorman Safi: Treating Rodel Glaspy/Extender:Robson, Cheryl Flash, [place] Weeks in Treatment: 28 Vital Signs Height(in): 74 Pulse(bpm): 56 Weight(lbs): 212 Blood Pressure(mmHg): 155/100 Body Mass Index(BMI): 27 Temperature(F): 97.5 Respiratory 20 Rate(breaths/min): Photos: [12:No Photos] [13:No Photos] [14:No Photos] Wound Location: [12:Left Lower Leg - Lateral Left Toe Second] [14:Left Toe Third] Wounding Event: [12:Blister] [13:Gradually Appeared] [14:Gradually Appeared] Primary Etiology: [12:Venous Leg Ulcer] [13:Diabetic Wound/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] [14:Lower Extremity] Comorbid History: [12:Cataracts, Hypertension, Cataracts, Hypertension, Cataracts, Hypertension, Peripheral Venous Disease, Peripheral Venous Disease, Peripheral Venous Disease, Type II Diabetes, Gout, Received Radiation] [13:Type II Diabetes, Gout,  Received Radiation] [14:Type II Diabetes, Gout, Received Radiation] Date Acquired: [12:08/06/2019] [13:08/22/2019] [14:08/22/2019] Weeks of Treatment: [12:5] [13:3] [14:3] Wound Status: [12:Open] [13:Open] [14:Open] Wound Recurrence: [12:No] [13:No] [14:No] Measurements L x W x D 0x0x0 [13:1.1x0.9x0.1] [14:0.3x0.3x0.1] (cm) Area (cm) : [12:0] [13:0.778] [14:0.071] Volume (cm) : [12:0] [13:0.078] [14:0.007] % Reduction in Area: [12:100.00%]  [13:74.70%] [14:95.00%] % Reduction in Volume: 100.00% [13:74.70%] [14:95.00%] Classification: [12:Full Thickness Without Exposed Support Structures] [13:Grade 1] [14:Grade 1] Exudate Amount: [12:Small] [13:Medium] [14:Medium] Exudate Type: [12:Serous] [13:Serosanguineous] [14:Serosanguineous] Exudate Color: [12:amber] [13:red, brown] [14:red, brown] Wound Margin: [12:Flat and Intact] [13:Distinct, outline attached Distinct, outline attached] Granulation Amount: [12:None Present (0%)] [13:Large (67-100%)] [14:Large (67-100%)] Granulation Quality: [12:N/A] [13:Red, Pink] [14:Red, Pink] Necrotic Amount: [12:None Present (0%)] [13:Small (1-33%)] [14:Small (1-33%)] Exposed Structures: [12:Fascia: No Fat Layer (Subcutaneous Tissue) Exposed: Yes Tissue) Exposed: No Tendon: No Muscle: No Joint: No Bone: No] [13:Fat Layer (Subcutaneous Fat Layer (Subcutaneous Fascia: No Tendon: No Muscle: No Joint: No Bone: No] [14:Tissue) Exposed: Yes  Fascia: No Tendon: No Muscle: No Joint: No Bone: No] Epithelialization: [12:Large (67-100%)] [13:Small (1-33%)] [14:Small (1-33%)] Debridement: [12:N/A] [13:N/A] [14:N/A] Pain Control: [12:N/A] [13:N/A] [14:N/A] Tissue Debrided: [12:N/A] [13:N/A] [14:N/A] Level: [12:N/A] [13:N/A] [14:N/A] Debridement Area (sq cm):N/A [13:N/A] [14:N/A] Instrument: [12:N/A] [13:N/A] [14:N/A] Bleeding: [12:N/A] [13:N/A] [14:N/A] Hemostasis Achieved: [12:N/A] [13:N/A] [14:N/A] Procedural Pain: [12:N/A] [13:N/A] [14:N/A] Post Procedural Pain: [12:N/A] [13:N/A] [14:N/A] Debridement Treatment N/A [13:N/A] [14:N/A] Response: Post Debridement [12:N/A] [13:N/A] [14:N/A] Measurements L x W x D (cm) Post Debridement [12:N/A] [13:N/A] [14:N/A] Volume: (cm) Procedures Performed: N/A [12:15] [13:N/A 16] [14:N/A 17] Photos: [12:No Photos] [13:No Photos] [14:No Photos] Wound Location: [12:Left Lower Leg - Anterior Left Lower Leg - Anterior, Right Lower Leg - Medial] [13:Distal] Wounding  Event: [12:Blister] [13:Blister] [14:Blister] Primary Etiology: [12:Diabetic Wound/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] [14:Venous Leg Ulcer] Comorbid History: [12:Cataracts, Hypertension, Cataracts, Hypertension, Cataracts, Hypertension, Peripheral Venous Disease, Peripheral Venous Disease, Peripheral Venous Disease, Type II Diabetes, Gout, Received Radiation] [13:Type II Diabetes, Gout,  Received Radiation] [14:Type II Diabetes, Gout, Received Radiation] Date Acquired: [12:08/29/2019] [13:09/05/2019] [14:09/05/2019] Weeks of Treatment: [12:2] [13:1] [14:1] Wound Status: [12:Open] [13:Open] [14:Open] Wound Recurrence: [12:No] [13:No] [14:No] Measurements L x W x D 3.5x5.5x0.1 [13:0.5x0.5x0.1] [14:0.5x0.5x0.5] (cm) Area (cm) : [12:15.119] [13:0.196] [14:0.196] Volume (cm) : [12:1.512] [13:0.02] [14:0.098] % Reduction in Area: [12:-83.30%] [13:16.90%] [14:84.90%] % Reduction in Volume: -83.30% [13:16.70%] [14:24.60%] Classification: [12:Grade 2] [13:Full Thickness Without Exposed Support Structures Exposed Support Structures] [14:Full Thickness Without] Exudate Amount: [12:Medium] [13:Medium] [14:Medium] Exudate Type: [12:Serous] [13:Serous] [14:Serous] Exudate Color: [12:amber] [13:amber] [14:amber] Wound Margin: [12:Distinct, outline attached Distinct, outline attached Distinct, outline attached] Granulation Amount: [12:Medium (34-66%)] [13:Medium (34-66%)] [14:Large (67-100%)] Granulation Quality: [12:Red] [  13:N/A] [14:Pink] Necrotic Amount: [12:Medium (34-66%)] [13:Medium (34-66%)] [14:Small (1-33%)] Exposed Structures: [12:Fat Layer (Subcutaneous Fat Layer (Subcutaneous Fat Layer (Subcutaneous Tissue) Exposed: Yes Fascia: No Tendon: No Muscle: No Joint: No Bone: No] [13:Tissue) Exposed: Yes Fascia: No Tendon: No Muscle: No Joint: No Bone: No] [14:Tissue) Exposed: Yes  Fascia: No Tendon: No Muscle: No Joint: No Bone: No] Epithelialization: [12:None] [13:None]  [14:None] Debridement: [12:Debridement - Excisional N/A] [14:N/A] Pre-procedure [12:08:56] [13:N/A] [14:N/A] Verification/Time Out Taken: Pain Control: [12:Other] [13:N/A] [14:N/A] Tissue Debrided: [12:Subcutaneous, Slough] [13:N/A] [14:N/A] Level: [12:Skin/Subcutaneous Tissue N/A] [14:N/A] Debridement Area (sq cm):19.25 [13:N/A] [14:N/A] Instrument: [12:Curette] [13:N/A] [14:N/A] Bleeding: [12:Moderate] [13:N/A] [14:N/A] Hemostasis Achieved: [12:Pressure] [13:N/A] [14:N/A] Procedural Pain: [12:3] [13:N/A] [14:N/A] Post Procedural Pain: [12:0] [13:N/A] [14:N/A] Debridement Treatment Procedure was tolerated [13:N/A] [14:N/A] Response: [12:well] Post Debridement [12:3.5x5.5x0.1] [13:N/A] [14:N/A] Measurements L x W x D (cm) Post Debridement [12:1.512] [13:N/A] [14:N/A] Volume: (cm) Procedures Performed: Debridement [12:18] [13:N/A] [14:N/A 3R N/A] Photos: [12:No Photos] [13:No Photos] [14:N/A] Wound Location: [12:Left Foot - Medial] [13:Right Calf - Posterior] [14:N/A] Wounding Event: [12:Gradually Appeared] [13:Gradually Appeared] [14:N/A] Primary Etiology: [12:Diabetic Wound/Ulcer of the Diabetic Wound/Ulcer of the N/A Lower Extremity] [13:Lower Extremity] Comorbid History: [12:Cataracts, Hypertension, Cataracts, Hypertension, N/A Peripheral Venous Disease, Peripheral Venous Disease, Type II Diabetes, Gout, Received Radiation] [13:Type II Diabetes, Gout, Received Radiation] Date Acquired: [12:09/03/2019] [13:02/28/2019] [14:N/A] Weeks of Treatment: [12:1] [13:28] [14:N/A] Wound Status: [12:Open] [13:Open] [14:N/A] Wound Recurrence: [12:No] [13:Yes] [14:N/A] Measurements L x W x D 1x1.3x0.1 [13:2x1.7x0.1] [14:N/A] (cm) Area (cm) : [12:1.021] [13:2.67] [14:N/A] Volume (cm) : [12:0.102] [13:0.267] [14:N/A] % Reduction in Area: [12:0.00%] [13:89.10%] [14:N/A] % Reduction in Volume: 0.00% [13:89.10%] [14:N/A] Classification: [12:Grade 2] [13:Grade 2] [14:N/A] Exudate Amount:  [12:Medium] [13:Medium] [14:N/A] Exudate Type: [12:Serosanguineous] [13:Serous] [14:N/A] Exudate Color: [12:red, brown] [13:amber] [14:N/A] Wound Margin: [12:Flat and Intact] [13:Distinct, outline attached N/A] Granulation Amount: [12:Large (67-100%)] [13:Medium (34-66%)] [14:N/A] Granulation Quality: [12:Red, Pink] [13:Red, Pink, Pale] [14:N/A] Necrotic Amount: [12:None Present (0%)] [13:Medium (34-66%)] [14:N/A] Exposed Structures: [12:Fascia: No Fat Layer (Subcutaneous Tissue) Exposed: Yes Tissue) Exposed: No Tendon: No Muscle: No Joint: No Bone: No] [13:Fat Layer (Subcutaneous N/A Fascia: No Tendon: No Muscle: No Joint: No Bone: No] Epithelialization: [12:None] [13:None] [14:N/A] Debridement: [12:N/A] [13:N/A] [14:N/A] Pain Control: [12:N/A] [13:N/A] [14:N/A] Tissue Debrided: [12:N/A] [13:N/A] [14:N/A] Level: [12:N/A] [13:N/A] [14:N/A] Debridement Area (sq cm):N/A [13:N/A] [14:N/A] Instrument: [12:N/A] [13:N/A] [14:N/A] Bleeding: [12:N/A] [13:N/A] [14:N/A] Hemostasis Achieved: [12:N/A] [13:N/A] [14:N/A] Procedural Pain: [12:N/A] [13:N/A] [14:N/A] Post Procedural Pain: [12:N/A] [13:N/A] [14:N/A] Debridement Treatment N/A [13:N/A] [14:N/A] Response: Post Debridement [12:N/A] [13:N/A] [14:N/A] Measurements L x W x D (cm) Post Debridement [12:N/A] [13:N/A] [14:N/A] Volume: (cm) Procedures Performed: N/A [13:N/A] [14:N/A] Treatment Notes Electronic Signature(s) Signed: 09/12/2019 5:53:46 PM By: [name] MD Entered By: [name] on 09/12/2019 09:21:18 -------------------------------------------------------------------------------- Multi-Disciplinary Care Plan Details Patient Name: Date of Service: [name] 09/12/2019 8:15 AM Medical Record [id number] Patient Account Number: [id number] Date of Birth/Sex: Treating RN: August 28, 1941 (78 y.o. [name]) [name] Primary Care Fatisha Rabalais: Hurshel Party Other Clinician: Referring Kelise Kuch: Treating  Malique Driskill/Extender:Robson, Cheryl Flash, [place] Weeks in Treatment: 28 Active Inactive Wound/Skin Impairment Nursing Diagnoses: Knowledge deficit related to ulceration/compromised skin integrity Goals: Patient/caregiver will verbalize understanding of skin care regimen Date Initiated: 02/28/2019 Target Resolution Date: 09/29/2019 Goal Status: Active Ulcer/skin breakdown will have a volume reduction of 30% by week 4 Date Initiated: 02/28/2019 Date Inactivated: 04/04/2019 Target Resolution Date: 03/31/2019 Goal Status: Met Ulcer/skin breakdown will have a volume reduction of 50% by week 8 Date Initiated: 04/04/2019 Date Inactivated:  05/09/2019 Target Resolution Date: 05/05/2019 Goal Status: Met Ulcer/skin breakdown will have a volume reduction of 80% by week 12 Date Initiated: 05/09/2019 Date Inactivated: 06/13/2019 Target Resolution Date: 06/09/2019 Unmet Goal Status: Unmet Reason: comorbities/new wounds Ulcer/skin breakdown will heal within 14 weeks Date Initiated: 06/13/2019 Date Inactivated: 07/11/2019 Target Resolution Date: 07/07/2019 Unmet Goal Status: Unmet Reason: comorbityies Interventions: Assess patient/caregiver ability to obtain necessary supplies Assess patient/caregiver ability to perform ulcer/skin care regimen upon admission and as needed Assess ulceration(s) every visit Notes: Electronic Signature(s) Signed: 09/13/2019 9:49:12 AM By: [name] RN Entered By: [name] on 09/12/2019 08:08:18 -------------------------------------------------------------------------------- Pain Assessment Details Patient Name: Date of Service: JAHID, WEIDA 09/12/2019 8:15 AM Medical Record [id number] Patient Account Number: [id number] Date of Birth/Sex: Treating RN: 02-04-1941 (78 y.o. [name] Primary Care Shellyann Wandrey: Hurshel Party Other Clinician: Referring Kalista Laguardia: Treating Trysten Berti/Extender:Robson, Cheryl Flash, [place] Weeks in Treatment: 28 Active  Problems Location of Pain Severity and Description of Pain Patient Has Paino No Site Locations Pain Management and Medication Current Pain Management: Electronic Signature(s) Signed: 09/14/2019 5:33:40 PM By: Kela Millin Entered By: Kela Millin on 09/12/2019 08:20:26 -------------------------------------------------------------------------------- Patient/Caregiver Education Details Patient Name: Date of Service: Armando Gang 12/15/2020andnbsp8:15 AM Medical Record [id number] Patient Account Number: [id number] Date of Birth/Gender: Treating RN: 1941/03/23 (78 y.o. [name] Primary Care Physician: Hurshel Party Other Clinician: Referring Physician: Treating Physician/Extender:Robson, Cheryl Flash, [facility] Weeks in Treatment: 48 Education Assessment Education Provided To: Patient Education Topics Provided Safety: Methods: Explain/Verbal Responses: State content correctly Electronic Signature(s) Signed: 09/13/2019 9:49:12 AM By: [name] RN Entered By: [name] on 09/12/2019 08:08:51 -------------------------------------------------------------------------------- Wound Assessment Details Patient Name: Date of Service: BRAXXTON, STOUDT 09/12/2019 8:15 AM Medical Record [id number] Patient Account Number: [id number] Date of Birth/Sex: Treating RN: 01-04-1941 (78 y.o. [name] Primary Care Khoa Opdahl: Hurshel Party Other Clinician: Referring Glenda Spelman: Treating Aliyyah Riese/Extender:Robson, Cheryl Flash, [place] Weeks in Treatment: 28 Wound Status Wound Number: 12 Primary Venous Leg Ulcer Etiology: Wound Location: Left Lower Leg - Lateral Wound Open Wounding Event: Blister Status: Date Acquired: 08/06/2019 Comorbid Cataracts, Hypertension, Peripheral Venous Weeks Of Treatment: 5 History: Disease, Type II Diabetes, Gout, Received Clustered Wound: No Radiation Photos Wound Measurements Length: (cm) 0 %  Reduct Width: (cm) 0 % Reduct Depth: (cm) 0 Epitheli Area: (cm) 0 Tunneli Volume: (cm) 0 Undermi Wound Description Full Thickness Without Exposed Support Foul Od Classification: Structures Slough/ Wound Flat and Intact Margin: Exudate Small Amount: Exudate Serous Type: Exudate amber Color: Wound Bed Granulation Amount: None Present (0%) Necrotic Amount: None Present (0%) Fascia Ex Fat Layer Tendon Ex Muscle Ex Joint Exp Bone Expo or After Cleansing: No Fibrino No Exposed Structure posed: No (Subcutaneous Tissue) Exposed: No posed: No posed: No osed: No sed: No ion in Area: 100% ion in Volume: 100% alization: Large (67-100%) ng: No ning: No Treatment Notes Wound #12 (Left, Lateral Lower Leg) 1. Cleanse With Wound Cleanser Soap and water 2. Periwound Care TCA Ointment Other periwound care (specifiy in notes) 3. Primary Dressing Applied Hydrofera Blue 4. Secondary Dressing ABD Pad 6. Support Layer Applied Kerlix/[employer] Notes ketoconazole to [facility] Signature(s) Signed: 09/13/2019 3:47:19 PM By: Mikeal Hawthorne EMT/HBOT Signed: 09/14/2019 5:33:40 PM By: Kela Millin Entered By: Mikeal Hawthorne on 09/13/2019 14:46:05 -------------------------------------------------------------------------------- Wound Assessment Details Patient Name: Date of Service: DAVEYON, KITCHINGS 09/12/2019 8:15 AM Medical Record [id number] Patient Account Number: [id number] Date of Birth/Sex: Treating RN: 1940/10/29 (78 y.o. [name] Primary Care Beverley Sherrard: [place] Party Other Clinician:  Referring Hikari Tripp: Treating Regino Fournet/Extender:Robson, Cheryl Flash, [place] Weeks in Treatment: 28 Wound Status Wound Number: 13 Primary Diabetic Wound/Ulcer of the Lower Extremity Etiology: Wound Location: Left Toe Second Wound Open Wound Open Wounding Event: Gradually Appeared Status: Date Acquired: 08/22/2019 Comorbid Cataracts,  Hypertension, Peripheral Venous Weeks Of Treatment: 3 History: Disease, Type II Diabetes, Gout, Received Clustered Wound: No Radiation Photos Wound Measurements Length: (cm) 1.1 % Reduction i Width: (cm) 0.9 % Reduction i Depth: (cm) 0.1 Epithelializa Area: (cm) 0.778 Tunneling: Volume: (cm) 0.078 Undermining: Wound Description Classification: Grade 1 Foul Odor Aft Wound Margin: Distinct, outline attached Slough/Fibrin Exudate Amount: Medium Exudate Type: Serosanguineous Exudate Color: red, brown Wound Bed Granulation Amount: Large (67-100%) Granulation Quality: Red, Pink Fascia Expose Necrotic Amount: Small (1-33%) Fat Layer (Su Necrotic Quality: Adherent Slough Tendon Expose Muscle Expose Joint Exposed Bone Exposed: er Cleansing: No o Yes Exposed Structure d: No bcutaneous Tissue) Exposed: Yes d: No d: No : No No n Area: 74.7% n Volume: 74.7% tion: Small (1-33%) No No Treatment Notes Wound #13 (Left Toe Second) 1. Cleanse With Wound Cleanser Soap and water 2. Periwound Care TCA Ointment Other periwound care (specifiy in notes) 3. Primary Dressing Applied Hydrofera Blue 4. Secondary Dressing ABD Pad 6. Support Layer Applied Kerlix/[employer] Notes ketoconazole to [facility] Signature(s) Signed: 09/14/2019 3:39:50 PM By: Mikeal Hawthorne EMT/HBOT Signed: 09/14/2019 5:33:40 PM By: Kela Millin Entered By: Mikeal Hawthorne on 09/14/2019 14:32:02 -------------------------------------------------------------------------------- Wound Assessment Details Patient Name: Date of Service: HEYDEN, JABER 09/12/2019 8:15 AM Medical Record [id number] Patient Account Number: [id number] Date of Birth/Sex: Treating RN: December 09, 1940 (78 y.o. [name] Primary Care Haru Anspaugh: [place] Party Other Clinician: Referring Kathan Kirker: Treating Setareh Rom/Extender:Robson, Cheryl Flash, [place] Weeks in Treatment: 28 Wound Status Wound  Number: 14 Primary Diabetic Wound/Ulcer of the Lower Extremity Etiology: Wound Location: Left Toe Third Wound Open Wounding Event: Gradually Appeared Status: Date Acquired: 08/22/2019 Comorbid Cataracts, Hypertension, Peripheral Venous Weeks Of Treatment: 3 History: Disease, Type II Diabetes, Gout, Received Clustered Wound: No Radiation Photos Wound Measurements Length: (cm) 0.3 % Reductio Width: (cm) 0.3 % Reductio Depth: (cm) 0.1 Epithelial Area: (cm) 0.071 Tunneling Volume: (cm) 0.007 Undermini Wound Description Classification: Grade 1 Wound Margin: Distinct, outline attached Exudate Amount: Medium Exudate Type: Serosanguineous Exudate Color: red, brown Wound Bed Granulation Amount: Large (67-100%) Granulation Quality: Red, Pink Necrotic Amount: Small (1-33%) Necrotic Quality: Adherent Slough Foul Odor After Cleansing: No Slough/Fibrino Yes Exposed Structure Fascia Exposed: No Fat Layer (Subcutaneous Tissue) Exposed: Yes Tendon Exposed: No Muscle Exposed: No Joint Exposed: No Bone Exposed: No n in Area: 95% n in Volume: 95% ization: Small (1-33%) : No ng: No Treatment Notes Wound #14 (Left Toe Third) 1. Cleanse With Wound Cleanser Soap and water 2. Periwound Care TCA Ointment Other periwound care (specifiy in notes) 3. Primary Dressing Applied Hydrofera Blue 4. Secondary Dressing ABD Pad 6. Support Layer Applied Kerlix/[employer] Notes ketoconazole to [facility]) Signed: 09/14/2019 3:39:50 PM By: Mikeal Hawthorne EMT/HBOT Signed: 09/14/2019 5:33:40 PM By: Kela Millin Entered By: Mikeal Hawthorne on 09/14/2019 14:31:42 -------------------------------------------------------------------------------- Wound Assessment Details Patient Name: Date of Service: LUKIS, BUNT 09/12/2019 8:15 AM Medical Record [id number] Patient Account Number: [id number] Date of Birth/Sex: Treating RN: 12-Sep-1941 (78 y.o. [name] Primary Care Maitland Lesiak: [place] Party Other Clinician: Referring Achaia Garlock: Treating Tyde Lamison/Extender:Robson, Cheryl Flash, [place] Weeks in Treatment: 28 Wound Status Wound Number: 15 Primary Diabetic Wound/Ulcer of the Lower Extremity Etiology: Wound Location: Left Lower Leg - Anterior Wound  Open Wounding Event: Blister Status: Date Acquired: 08/29/2019 Comorbid Cataracts, Hypertension, Peripheral Venous Weeks Of Treatment: 2 History: Disease, Type II Diabetes, Gout, Received Clustered Wound: No Radiation Photos Wound Measurements Length: (cm) 3.5 Width: (cm) 5.5 Depth: (cm) 0.1 Area: (cm) 15.119 Volume: (cm) 1.512 Wound Description Classification: Grade 2 Wound Margin: Distinct, outline attached Exudate Amount: Medium Exudate Type: Serous Exudate Color: amber Wound Bed Granulation Amount: Medium (34-66%) Granulation Quality: Red Necrotic Amount: Medium (34-66%) Necrotic Quality: Adherent Slough After Cleansing: No brino Yes Exposed Structure posed: No (Subcutaneous Tissue) Exposed: Yes posed: No posed: No osed: No sed: No % Reduction in Area: -83.3% % Reduction in Volume: -83.3% Epithelialization: None Tunneling: No Undermining: No Foul Odor Slough/Fi Fascia Ex Fat Layer Tendon Ex Muscle Ex Joint Exp Bone Expo Treatment Notes Wound #15 (Left, Anterior Lower Leg) 1. Cleanse With Wound Cleanser Soap and water 2. Periwound Care TCA Ointment Other periwound care (specifiy in notes) 3. Primary Dressing Applied Hydrofera Blue 4. Secondary Dressing ABD Pad 6. Support Layer Applied Kerlix/[employer] Notes ketoconazole to [facility]) Signed: 09/13/2019 3:47:19 PM By: Mikeal Hawthorne EMT/HBOT Signed: 09/14/2019 5:33:40 PM By: Kela Millin Entered By: Mikeal Hawthorne on 09/13/2019 14:58:26 -------------------------------------------------------------------------------- Wound Assessment Details Patient  Name: Date of Service: ALP, GOLDWATER 09/12/2019 8:15 AM Medical Record [id number] Patient Account Number: [id number] Date of Birth/Sex: Treating RN: 10-12-1940 (78 y.o. [name] Primary Care Ishmeal Rorie: [place] Party Other Clinician: Referring Husein Guedes: Treating Jessy Cybulski/Extender:Robson, Cheryl Flash, [place] Weeks in Treatment: 28 Wound Status Wound Number: 16 Primary Venous Leg Ulcer Etiology: Wound Location: Left Lower Leg - Anterior, Distal Wound Open Wounding Event: Blister Status: Date Acquired: 09/05/2019 Comorbid Cataracts, Hypertension, Peripheral Venous Weeks Of Treatment: 1 History: Disease, Type II Diabetes, Gout, Received Clustered Wound: No Radiation Photos Wound Measurements Length: (cm) 0.5 % Reduct Width: (cm) 0.5 % Reduct Depth: (cm) 0.1 Epitheli Area: (cm) 0.196 Tunneli Volume: (cm) 0.02 Undermi Wound Description Classification: Full Thickness Without Exposed Support Foul Od Structures Slough/ Wound Distinct, outline attached Margin: Exudate Medium Amount: Exudate Serous Type: Exudate amber Color: Wound Bed Granulation Amount: Medium (34-66%) Necrotic Amount: Medium (34-66%) Fascia Necrotic Quality: Adherent Slough Fat Lay Tendon Muscle Joint E Bone Expo or After Cleansing: No Fibrino Yes Exposed Structure Exposed: No er (Subcutaneous Tissue) Exposed: Yes Exposed: No Exposed: No xposed: No sed: No ion in Area: 16.9% ion in Volume: 16.7% alization: None ng: No ning: No Treatment Notes Wound #16 (Left, Distal, Anterior Lower Leg) 1. Cleanse With Wound Cleanser Soap and water 2. Periwound Care TCA Ointment Other periwound care (specifiy in notes) 3. Primary Dressing Applied Hydrofera Blue 4. Secondary Dressing ABD Pad 6. Support Layer Applied Kerlix/[employer] Notes ketoconazole to [facility]) Signed: 09/14/2019 3:39:50 PM By: Mikeal Hawthorne EMT/HBOT Signed: 09/14/2019  5:33:40 PM By: Kela Millin Entered By: Mikeal Hawthorne on 09/14/2019 14:31:17 -------------------------------------------------------------------------------- Wound Assessment Details Patient Name: Date of Service: PAVEL, GADD 09/12/2019 8:15 AM Medical Record [id number] Patient Account Number: [id number] Date of Birth/Sex: Treating RN: 07/12/1941 (79 y.o. [name] Primary Care Aliz Meritt: [place] Party Other Clinician: Referring Shavaun Osterloh: Treating Dillan Lunden/Extender:Robson, Cheryl Flash, [place] Weeks in Treatment: 28 Wound Status Wound Number: 17 Primary Venous Leg Ulcer Etiology: Wound Location: Right Lower Leg - Medial Wound Open Wounding Event: Blister Status: Date Acquired: 09/05/2019 Comorbid Cataracts, Hypertension, Peripheral Venous Weeks Of Treatment: 1 History: Disease, Type II Diabetes, Gout, Received Clustered Wound: No Radiation Photos Wound Measurements Length: (cm) 0.5 % Reduct Width: (cm) 0.5 %  Reduct Depth: (cm) 0.5 Epitheli Area: (cm) 0.196 Tunneli Volume: (cm) 0.098 Undermi Wound Description Full Thickness Without Exposed Support Foul Odo Classification: Structures Slough/F Wound Distinct, outline attached Margin: Exudate Medium Amount: Exudate Serous Type: Exudate amber Color: Wound Bed Granulation Amount: Large (67-100%) Granulation Quality: Pink Fascia E Necrotic Amount: Small (1-33%) Fat Laye Necrotic Quality: Adherent Slough Tendon E Muscle E Joint Ex Bone Exp r After Cleansing: No ibrino Yes Exposed Structure xposed: No r (Subcutaneous Tissue) Exposed: Yes xposed: No xposed: No posed: No osed: No ion in Area: 84.9% ion in Volume: 24.6% alization: None ng: No ning: No Treatment Notes Wound #17 (Right, Medial Lower Leg) 1. Cleanse With Wound Cleanser Soap and water 2. Periwound Care TCA Ointment Other periwound care (specifiy in notes) 3. Primary Dressing Applied Hydrofera Blue 4.  Secondary Dressing ABD Pad 6. Support Layer Applied Kerlix/[employer] Notes ketoconazole to [facility] Signature([facility]) Signed: 09/13/2019 3:47:19 PM By: Mikeal Hawthorne EMT/HBOT Signed: 09/14/2019 5:33:40 PM By: Kela Millin Entered By: Mikeal Hawthorne on 09/13/2019 14:44:56 -------------------------------------------------------------------------------- Wound Assessment Details Patient Name: Date of Service: EDDIE, PAYETTE 09/12/2019 8:15 AM Medical Record [id number] Patient Account Number: [id number] Date of Birth/Sex: Treating RN: 1941-08-21 (78 y.o. [name] Primary Care Aneesah Hernan: [place] Party Other Clinician: Referring Harlon Kutner: Treating Kensli Bowley/Extender:Robson, Cheryl Flash, [place] Weeks in Treatment: 28 Wound Status Wound Number: 18 Primary Diabetic Wound/Ulcer of the Lower Extremity Etiology: Wound Location: Left Foot - Medial Wound Open Wounding Event: Gradually Appeared Status: Date Acquired: 09/03/2019 Comorbid Cataracts, Hypertension, Peripheral Venous Weeks Of Treatment: 1 History: Disease, Type II Diabetes, Gout, Received Clustered Wound: No Radiation Photos Wound Measurements Length: (cm) 1 Width: (cm) 1.3 Depth: (cm) 0.1 Area: (cm) 1.021 Volume: (cm) 0.102 Wound Description Classification: Grade 2 Wound Margin: Flat and Intact Exudate Amount: Medium Exudate Type: Serosanguineous Exudate Color: red, brown Wound Bed Granulation Amount: Large (67-100%) Granulation Quality: Red, Pink Necrotic Amount: None Present (0%) After Cleansing: No rino No Exposed Structure osed: No (Subcutaneous Tissue) Exposed: No osed: No osed: No sed: No sed: No % Reduction in Area: 0% % Reduction in Volume: 0% Epithelialization: None Tunneling: No Undermining: No Foul Odor Slough/Fib Fascia Exp Fat Layer Tendon Exp Muscle Exp Joint Expo Bone Expo Treatment Notes Wound #18 (Left, Medial Foot) 1. Cleanse  With Wound Cleanser Soap and water 2. Periwound Care TCA Ointment Other periwound care (specifiy in notes) 3. Primary Dressing Applied Hydrofera Blue 4. Secondary Dressing ABD Pad 6. Support Layer Applied Kerlix/[employer] Notes ketoconazole to [facility] Signature([facility]) Signed: 09/14/2019 3:39:50 PM By: Mikeal Hawthorne EMT/HBOT Signed: 09/14/2019 5:33:40 PM By: Kela Millin Entered By: Mikeal Hawthorne on 09/14/2019 14:35:14 -------------------------------------------------------------------------------- Wound Assessment Details Patient Name: Date of Service: KEVAUGHN, EWING 09/12/2019 8:15 AM Medical Record [id number] Patient Account Number: [id number] Date of Birth/Sex: Treating RN: 03-19-1941 (78 y.o. [name] Primary Care Dekota Shenk: [place]hel Party Other Clinician: Referring Dimitrious Micciche: Treating Lexie Koehl/Extender:Robson, Cheryl Flash, [place] Weeks in Treatment: 28 Wound Status Wound Number: 3R Primary Diabetic Wound/Ulcer of the Lower Extremity Etiology: Wound Location: Right Calf - Posterior Wound Open Wounding Event: Gradually Appeared Status: Date Acquired: 02/28/2019 Comorbid Cataracts, Hypertension, Peripheral Venous Weeks Of Treatment: 28 History: Disease, Type II Diabetes, Gout, Received Clustered Wound: No Radiation Photos Wound Measurements Length: (cm) 2 Width: (cm) 1.7 Depth: (cm) 0.1 Area: (cm) 2.67 Volume: (cm) 0.267 Wound Description Classification: Grade 2 Wound Margin: Distinct, outline attached Exudate Amount: Medium Exudate Type: Serous Exudate Color: amber Wound Bed Granulation Amount: Medium (34-66%)  Granulation Quality: Red, Pink, Pale Necrotic Amount: Medium (34-66%) Necrotic Quality: Adherent Slough After Cleansing: No brino Yes Exposed Structure posed: No (Subcutaneous Tissue) Exposed: Yes posed: No posed: No osed: No sed: No % Reduction in Area: 89.1% % Reduction in Volume:  89.1% Epithelialization: None Tunneling: No Undermining: No Foul Odor Slough/Fi Fascia Ex Fat Layer Tendon Ex Muscle Ex Joint Exp Bone Expo Treatment Notes Wound #3R (Right, Posterior Calf) 1. Cleanse With Wound Cleanser Soap and water 2. Periwound Care TCA Ointment Other periwound care (specifiy in notes) 3. Primary Dressing Applied Hydrofera Blue 4. Secondary Dressing ABD Pad 6. Support Layer Applied Kerlix/[employer] Notes ketoconazole to [employer]) Signed: 09/13/2019 3:47:19 PM By: Mikeal Hawthorne EMT/HBOT Signed: 09/14/2019 5:33:40 PM By: Kela Millin Entered By: Mikeal Hawthorne on 09/13/2019 14:44:07 -------------------------------------------------------------------------------- Vitals Details Patient Name: Date of Service: [name] 09/12/2019 8:15 AM Medical Record [id number] Patient Account Number: [id number] Date of Birth/Sex: Treating RN: 1941-05-17 (78 y.o. [name] Primary Care Kato Wieczorek: [place] Party Other Clinician: Referring Giomar Gusler: Treating Laasia Arcos/Extender:Robson, Cheryl Flash, [place] Weeks in Treatment: 28 Vital Signs Time Taken: 08:15 Temperature (F): 97.5 Height (in): 74 Pulse (bpm): 53 Weight (lbs): 212 Respiratory Rate (breaths/min): 20 Body Mass Index (BMI): 27.2 Blood Pressure (mmHg): 155/100 Reference Range: 80 - 120 mg / dl Electronic Signature(s) Signed: 09/14/2019 5:33:40 PM By: Kela Millin Entered By: Kela Millin on 09/12/2019 08:20:18

## 2019-09-15 ENCOUNTER — Ambulatory Visit (HOSPITAL_COMMUNITY)
Admission: RE | Admit: 2019-09-15 | Discharge: 2019-09-15 | Disposition: A | Discharge status: Home / Self Care | Payer: Medicare HMO | Source: Ambulatory Visit | Attending: Family Medicine | Admitting: Family Medicine

## 2019-09-15 ENCOUNTER — Other Ambulatory Visit: Payer: Self-pay

## 2019-09-15 DIAGNOSIS — I4821 Permanent atrial fibrillation: Secondary | ICD-10-CM | POA: Diagnosis not present

## 2019-09-15 DIAGNOSIS — I5022 Chronic systolic (congestive) heart failure: Secondary | ICD-10-CM | POA: Insufficient documentation

## 2019-09-15 NOTE — Progress Notes (Signed)
*  PRELIMINARY RESULTS* Echocardiogram 2D Echocardiogram has been performed.  Samuel Germany 09/15/2019, 12:29 PM

## 2019-09-19 ENCOUNTER — Encounter (HOSPITAL_BASED_OUTPATIENT_CLINIC_OR_DEPARTMENT_OTHER): Payer: Medicare HMO | Admitting: Internal Medicine

## 2019-09-19 ENCOUNTER — Other Ambulatory Visit: Payer: Self-pay

## 2019-09-19 DIAGNOSIS — L97222 Non-pressure chronic ulcer of left calf with fat layer exposed: Secondary | ICD-10-CM | POA: Diagnosis not present

## 2019-09-19 DIAGNOSIS — L97521 Non-pressure chronic ulcer of other part of left foot limited to breakdown of skin: Secondary | ICD-10-CM | POA: Diagnosis not present

## 2019-09-19 DIAGNOSIS — E11622 Type 2 diabetes mellitus with other skin ulcer: Secondary | ICD-10-CM | POA: Diagnosis not present

## 2019-09-19 DIAGNOSIS — E11621 Type 2 diabetes mellitus with foot ulcer: Secondary | ICD-10-CM | POA: Diagnosis not present

## 2019-09-19 DIAGNOSIS — L97522 Non-pressure chronic ulcer of other part of left foot with fat layer exposed: Secondary | ICD-10-CM | POA: Diagnosis not present

## 2019-09-19 DIAGNOSIS — I890 Lymphedema, not elsewhere classified: Secondary | ICD-10-CM | POA: Diagnosis not present

## 2019-09-19 DIAGNOSIS — L97211 Non-pressure chronic ulcer of right calf limited to breakdown of skin: Secondary | ICD-10-CM | POA: Diagnosis not present

## 2019-09-19 DIAGNOSIS — L97512 Non-pressure chronic ulcer of other part of right foot with fat layer exposed: Secondary | ICD-10-CM | POA: Diagnosis not present

## 2019-09-19 DIAGNOSIS — I87333 Chronic venous hypertension (idiopathic) with ulcer and inflammation of bilateral lower extremity: Secondary | ICD-10-CM | POA: Diagnosis not present

## 2019-09-19 NOTE — Progress Notes (Signed)
[name], [name] ([id number]) Visit Report for 09/19/2019 SuperBill Details Patient Name: Date of Service: [name], [name] 09/19/2019 Medical Record [id number] Patient Account Number: [id number] Date of Birth/Sex: Treating RN: 1941-01-07 (78 y.o. 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 2 diabetes mellitus with diabetic polyneuropathy L97.221 Non-pressure chronic ulcer of left calf limited to breakdown of skin I87.333 Chronic venous hypertension (idiopathic) with ulcer and inflammation of bilateral lower extremity B35.3 Tinea pedis Facility Procedures The patient participates with Medicare or their insurance follows the Medicare Facility Guidelines CPT4 Code Description Modifier Quantity [id number] - APPLY UNNA BOOT/PROFO BILATERAL 1 Electronic Signature(s) Signed: 09/19/2019 5:14:15 PM By: Deon Pilling Signed: 09/19/2019 6:32:44 PM By: [name] MD Entered By: Deon Pilling on 09/19/2019 08:16:12

## 2019-09-19 NOTE — Progress Notes (Signed)
QWANELL, PELEGRIN ([id number]) Visit Report for 09/19/2019 Arrival Information Details Patient Name: Date of Service: [name], [name] 09/19/2019 8:00 AM Medical Record Vivian Patient Account Number: [id number] Date of Birth/Sex: Treating RN: [birth year] (78 y.o. [name] Primary Care Saree Krogh: [place] Other Clinician: Referring Amarise Lillo: Treating Letta Cargile/Extender:Robson, Cheryl Flash, [place] Weeks in Treatment: 29 Visit Information History Since Last Visit Added or deleted any medications: Yes Patient Arrived: Ambulatory Any new allergies or adverse reactions: No Arrival Time: 08:07 Had a fall or experienced change in No Accompanied By: self activities of daily living that may affect Transfer Assistance: None risk of falls: Patient Identification Verified: Yes Signs or symptoms of abuse/neglect since last No Secondary Verification Process Yes visito Completed: Hospitalized since last visit: No Patient Requires Transmission- No Implantable device outside of the clinic excluding No Based Precautions: cellular tissue based products placed in the center Patient Has Alerts: Yes since last visit: Patient Alerts: Patient on Blood Has Dressing in Place as Prescribed: Yes Thinner Has Compression in Place as Prescribed: Yes Pain Present Now: No Electronic Signature(s) Signed: 09/19/2019 5:14:15 PM By: Deon Pilling Entered By: Deon Pilling on 09/19/2019 08:07:35 -------------------------------------------------------------------------------- Compression Therapy Details Patient Name: Date of Service: [name], [name]. 09/19/2019 8:00 AM Medical Record [id number] Patient Account Number: [id number] Date of Birth/Sex: Treating RN: August 11, 1941 (78 y.o. [name] Primary Care Shivaay Stormont: [place] Other Clinician: Referring Narek Kniss: Treating Omaya Nieland/Extender:Robson, Cheryl Flash, [place] Weeks in Treatment: 29 Compression  Therapy Performed for Wound Wound #15 Left,Anterior Lower Leg Assessment: Performed By: Clinician Baruch Gouty, RN Compression Type: Rolena Infante Pre Treatment ABI: 1.3 Electronic Signature(s) Signed: 09/19/2019 5:14:15 PM By: Deon Pilling Entered By: Deon Pilling on 09/19/2019 08:14:09 -------------------------------------------------------------------------------- Compression Therapy Details Patient Name: Date of Service: [name], [name] 09/19/2019 8:00 AM Medical Record [id number] Patient Account Number: [id number] Date of Birth/Sex: Treating RN: [birth year] (78 y.o. [name] Primary Care Cesar Alf: [place] Other Clinician: Referring Jaeleen Inzunza: Treating Horatio Bertz/Extender:Robson, Cheryl Flash, [place] Weeks in Treatment: 29 Compression Therapy Performed for Wound Wound #16 Left,Distal,Anterior Lower Leg Assessment: Performed By: Clinician Baruch Gouty, RN Compression Type: Rolena Infante Pre Treatment ABI: 1.3 Electronic Signature(s) Signed: 09/19/2019 5:14:15 PM By: Deon Pilling Entered By: Deon Pilling on 09/19/2019 08:14:09 -------------------------------------------------------------------------------- Compression Therapy Details Patient Name: Date of Service: [name], [name] 09/19/2019 8:00 AM Medical Record [id number] Patient Account Number: [id number] Date of Birth/Sex: Treating RN: 23-Nov-1940 (78 y.o. [name] Primary Care Denym Rahimi: [place] Other Clinician: Referring Jillian Warth: Treating Terilyn Sano/Extender:Robson, Cheryl Flash, [place] Weeks in Treatment: 29 Compression Therapy Performed for Wound Wound #17 Right,Medial Lower Leg Assessment: Performed By: Clinician Baruch Gouty, RN Compression Type: Rolena Infante Pre Treatment ABI: 1.3 Electronic Signature(s) Signed: 09/19/2019 5:14:15 PM By: Deon Pilling Entered By: Deon Pilling on 09/19/2019  08:14:09 -------------------------------------------------------------------------------- Compression Therapy Details Patient Name: Date of Service: [name], [name] 09/19/2019 8:00 AM Medical Record [id number] Patient Account Number: [id number] Date of Birth/Sex: Treating RN: 1940-11-04 (78 y.o. [name] Primary Care Lynn Sissel: [place] Other Clinician: Referring Briann Sarchet: Treating Keniya Schlotterbeck/Extender:Robson, Cheryl Flash, [place] Weeks in Treatment: 29 Compression Therapy Performed for Wound Wound #18 Left,Medial Foot Assessment: Performed By: Clinician Baruch Gouty, RN Compression Type: Rolena Infante Pre Treatment ABI: 1.3 Electronic Signature(s) Signed: 09/19/2019 5:14:15 PM By: Deon Pilling Entered By: Deon Pilling on 09/19/2019 08:14:09 -------------------------------------------------------------------------------- Compression Therapy Details Patient Name: Date of Service: [name], [name] 09/19/2019 8:00 AM Medical Record [id number] Patient Account Number: [id number] Date of Birth/Sex: Treating RN: 12/04/1940 (78 y.o. M) Rolin Barry,  Bobbi Primary Care Jerrica Thorman: [place] Party Other Clinician: Referring Brigitt Mcclish: Treating Avontae Burkhead/Extender:Robson, Cheryl Flash, [place] Weeks in Treatment: 29 Compression Therapy Performed for Wound Wound #3R Right,Posterior Calf Assessment: Performed By: Clinician Baruch Gouty, RN Compression Type: Rolena Infante Pre Treatment ABI: 1.3 Electronic Signature(s) Signed: 09/19/2019 5:14:15 PM By: Deon Pilling Entered By: Deon Pilling on 09/19/2019 08:14:09 -------------------------------------------------------------------------------- Encounter Discharge Information Details Patient Name: Date of Service: Daniel Pia D. 09/19/2019 8:00 AM Medical Record [id number] Patient Account Number: [id number] Date of Birth/Sex: Treating RN: 1941-04-16 (78 y.o. [name] Primary Care Anijah Spohr: [place] Other Clinician: Referring Yaniris Braddock: Treating Kendallyn Lippold/Extender:Robson, Cheryl Flash, [place] Weeks in Treatment: 34 Encounter Discharge Information Items Discharge Condition: Stable Ambulatory Status: Ambulatory Discharge Destination: Home Transportation: Private Auto Accompanied By: wife Schedule Follow-up Appointment: Yes Clinical Summary of Care: Electronic Signature(s) Signed: 09/19/2019 5:14:15 PM By: Deon Pilling Entered By: Deon Pilling on 09/19/2019 08:15:44 -------------------------------------------------------------------------------- Patient/Caregiver Education Details Patient Name: [name] 12/22/2020andnbsp8:00 Date of Service: AM Medical Record [id number] Number: Patient Account Number: [id number] Treating RN: Date of Birth/Gender: 04/20/1941 (78 y.o. [name]) Other Clinician: Primary Care Physician: Verita Schneiders Referring Physician: Physician/Extender: Rosina Lowenstein in Treatment: 33 Education Assessment Education Provided To: Patient Education Topics Provided Wound/Skin Impairment: Handouts: Skin Care Do's and Dont's Methods: Explain/Verbal Responses: Reinforcements needed Electronic Signature(s) Signed: 09/19/2019 5:14:15 PM By: Deon Pilling Entered By: Deon Pilling on 09/19/2019 08:15:30 -------------------------------------------------------------------------------- Wound Assessment Details Patient Name: Date of Service: [name], [name] 09/19/2019 8:00 AM Medical Record [id number] Patient Account Number: [id number] Date of Birth/Sex: Treating RN: 05/21/1941 (78 y.o. [name] Primary Care Tsuyako Jolley: [place] Other Clinician: Referring Jeannett Dekoning: Treating Zniyah Midkiff/Extender:Robson, Cheryl Flash, [place] Weeks in Treatment: 29 Wound Status Wound Number: 13 Primary Diabetic Wound/Ulcer of the Lower Etiology: Extremity Wound Location: Left Toe Second Wound  Status: Open Wounding Event: Gradually Appeared Date Acquired: 08/22/2019 Weeks Of Treatment: 4 Clustered Wound: No Wound Measurements Length: (cm) 1.1 Width: (cm) 0.9 Depth: (cm) 0.1 Area: (cm) 0.778 Volume: (cm) 0.078 Wound Description Classification: Grade 1 % Reduction in Area: 74.7% % Reduction in Volume: 74.7% Treatment Notes Wound #13 (Left Toe Second) 1. Cleanse With Wound Cleanser 2. Periwound Care Moisturizing lotion 3. Primary Dressing Applied Other primary dressing (specifiy in notes) 4. Secondary Dressing Dry Gauze Roll Gauze 5. Secured With Medipore tape Notes ketoconazole for primary. Electronic Signature(s) Signed: 09/19/2019 5:14:15 PM By: Deon Pilling Entered By: Deon Pilling on 09/19/2019 08:13:44 -------------------------------------------------------------------------------- Wound Assessment Details Patient Name: Date of Service: [name], [name] 09/19/2019 8:00 AM Medical Record [id number] Patient Account Number: [id number] Date of Birth/Sex: Treating RN: 05-Jun-1941 (78 y.o. [name] Primary Care Avika Carbine: [place] Other Clinician: Referring Xaviar Lunn: Treating Donell Tomkins/Extender:Robson, Cheryl Flash, [place] Weeks in Treatment: 29 Wound Status Wound Number: 14 Primary Diabetic Wound/Ulcer of the Lower Etiology: Extremity Wound Location: Left Toe Third Wound Status: Open Wounding Event: Gradually Appeared Date Acquired: 08/22/2019 Weeks Of Treatment: 4 Clustered Wound: No Wound Measurements Length: (cm) 0.3 Width: (cm) 0.3 Depth: (cm) 0.1 Area: (cm) 0.071 Volume: (cm) 0.007 Wound Description Classification: Grade 1 % Reduction in Area: 95% % Reduction in Volume: 95% Treatment Notes Wound #14 (Left Toe Third) 1. Cleanse With Wound Cleanser 2. Periwound Care Moisturizing lotion 3. Primary Dressing Applied Other primary dressing (specifiy in notes) 4. Secondary Dressing Dry Gauze Roll Gauze 5.  Secured With Medipore tape Notes ketoconazole for primary. Electronic Signature(s) Signed: 09/19/2019 5:14:15 PM By: Deon Pilling Entered By: Deon Pilling on 09/19/2019  08:13:44 -------------------------------------------------------------------------------- Wound Assessment Details Patient Name: Date of Service: [name], [name] 09/19/2019 8:00 AM Medical Record Merrill Patient Account Number: [id number] Date of Birth/Sex: Treating RN: [birth year] (78 y.o. [name] Primary Care Moustapha Tooker: Other Clinician: [place] Referring Maddison Kilner: Treating Nalany Steedley/Extender:Robson, Cheryl Flash, [place] Weeks in Treatment: 29 Wound Status Wound Number: 15 Primary Diabetic Wound/Ulcer of the Lower Etiology: Extremity Wound Location: Left, Anterior Lower Leg Wound Status: Open Wounding Event: Blister Date Acquired: 08/29/2019 Weeks Of Treatment: 3 Clustered Wound: No Wound Measurements Length: (cm) 3.5 Width: (cm) 5.5 Depth: (cm) 0.1 Area: (cm) 15.119 Volume: (cm) 1.512 Wound Description Classification: Grade 2 % Reduction in Area: -83.3% % Reduction in Volume: -83.3% Treatment Notes Wound #15 (Left, Anterior Lower Leg) 1. Cleanse With Wound Cleanser Soap and water 2. Periwound Care TCA Cream 3. Primary Dressing Applied Hydrofera Blue 4. Secondary Dressing ABD Pad Dry Gauze 6. Support Layer [employer] Notes netting. Electronic Signature(s) Signed: 09/19/2019 5:14:15 PM By: Deon Pilling Entered By: Deon Pilling on 09/19/2019 08:13:45 -------------------------------------------------------------------------------- Wound Assessment Details Patient Name: Date of Service: [name], [name] 09/19/2019 8:00 AM Medical Record [id number] Patient Account Number: [id number] Date of Birth/Sex: Treating RN: 1941-06-09 (78 y.o. [name] Primary Care Liron Eissler: [place] Other Clinician: Referring [place] Nie: Treating  Eldor Conaway/Extender:Robson, Cheryl Flash, [place] Weeks in Treatment: 29 Wound Status Wound Number: 16 Primary Etiology: Venous Leg Ulcer Wound Location: Left, Distal, Anterior Lower Leg Wound Status: Open Wounding Event: Blister Date Acquired: 09/05/2019 Weeks Of Treatment: 2 Clustered Wound: No Wound Measurements Length: (cm) 0.5 % Reduct Width: (cm) 0.5 % Reduct Depth: (cm) 0.1 Area: (cm) 0.196 Volume: (cm) 0.02 Wound Description Classification: Full Thickness Without Exposed Support Structures ion in Area: 16.9% ion in Volume: 16.7% Treatment Notes Wound #16 (Left, Distal, Anterior Lower Leg) 1. Cleanse With Wound Cleanser Soap and water 2. Periwound Care TCA Cream 3. Primary Dressing Applied Hydrofera Blue 4. Secondary Dressing ABD Pad Dry Gauze 6. Support Layer [employer] Notes netting. Electronic Signature(s) Signed: 09/19/2019 5:14:15 PM By: Deon Pilling Entered By: Deon Pilling on 09/19/2019 08:13:45 -------------------------------------------------------------------------------- Wound Assessment Details Patient Name: Date of Service: [name], [name] 09/19/2019 8:00 AM Medical Record [id number] Patient Account Number: [id number] Date of Birth/Sex: Treating RN: Sep 19, 1941 (78 y.o. [name] Primary Care Pratt Bress: [place] Party Other Clinician: Referring Kwanza Cancelliere: Treating Criselda Starke/Extender:Robson, Cheryl Flash, [place] Weeks in Treatment: 29 Wound Status Wound Number: 17 Primary Etiology: Venous Leg Ulcer Wound Location: Right, Medial Lower Leg Wound Status: Open Wounding Event: Blister Date Acquired: 09/05/2019 Weeks Of Treatment: 2 Clustered Wound: No Wound Measurements Length: (cm) 0.5 Width: (cm) 0.5 Depth: (cm) 0.1 Area: (cm) 0.196 Volume: (cm) 0.02 Wound Description Classification: Full Thickness Without Exposed Suppo Structures % Reduction in Area: 84.9% % Reduction in Volume: 84.6% rt Treatment  Notes Wound #17 (Right, Medial Lower Leg) 1. Cleanse With Wound Cleanser Soap and water 2. Periwound Care TCA Cream 3. Primary Dressing Applied Hydrofera Blue 4. Secondary Dressing ABD Pad Dry Gauze 6. Support Layer [employer] Notes netting. Electronic Signature(s) Signed: 09/19/2019 5:14:15 PM By: Deon Pilling Entered By: Deon Pilling on 09/19/2019 08:13:45 -------------------------------------------------------------------------------- Wound Assessment Details Patient Name: Date of Service: [name], HIGGASON 09/19/2019 8:00 AM Medical Record [id number] Patient Account Number: [id number] Date of Birth/Sex: Treating RN: 1940-12-04 (78 y.o. [name] Primary Care Kynnedy Carreno: [place] Party Other Clinician: Referring Jayde Daffin: Treating Jeanne Terrance/Extender:Robson, Cheryl Flash, [place] Weeks in Treatment: 29 Wound Status Wound Number: 18 Primary Diabetic Wound/Ulcer of the Lower  Etiology: Extremity Wound Location: Left, Medial Foot Wound Status: Open Wounding Event: Gradually Appeared Date Acquired: 09/03/2019 Weeks Of Treatment: 2 Clustered Wound: No Wound Measurements Length: (cm) 1 Width: (cm) 1.3 Depth: (cm) 0.1 Area: (cm) 1.021 Volume: (cm) 0.102 Wound Description Classification: Grade 2 % Reduction in Area: 0% % Reduction in Volume: 0% Treatment Notes Wound #18 (Left, Medial Foot) 1. Cleanse With Wound Cleanser Soap and water 2. Periwound Care TCA Cream 3. Primary Dressing Applied Hydrofera Blue 4. Secondary Dressing ABD Pad Dry Gauze 6. Support Layer [employer] Notes netting. Electronic Signature(s) Signed: 09/19/2019 5:14:15 PM By: Deon Pilling Entered By: Deon Pilling on 09/19/2019 08:13:45 -------------------------------------------------------------------------------- Wound Assessment Details Patient Name: Date of Service: CASTO, MIKEL 09/19/2019 8:00 AM Medical Record [id number] Patient  Account Number: [id number] Date of Birth/Sex: Treating RN: July 25, 1941 (78 y.o. [name] Primary Care Oliviarose Punch: [place] Party Other Clinician: Referring Dineen Conradt: Treating Keysi Oelkers/Extender:Robson, Cheryl Flash, [place] Weeks in Treatment: 29 Wound Status Wound Number: [id number] Primary Diabetic Wound/Ulcer of the Lower Etiology: Extremity Wound Location: Right, Posterior Calf Wound Status: Open Wounding Event: Gradually Appeared Date Acquired: 02/28/2019 Weeks Of Treatment: 29 Clustered Wound: No Wound Measurements Length: (cm) 2 Width: (cm) 1.7 Depth: (cm) 0.1 Area: (cm) 2.67 Volume: (cm) 0.267 Wound Description Classification: Grade 2 % Reduction in Area: 89.1% % Reduction in Volume: 89.1% Treatment Notes Wound #3R (Right, Posterior Calf) 1. Cleanse With Wound Cleanser Soap and water 2. Periwound Care TCA Cream 3. Primary Dressing Applied Hydrofera Blue 4. Secondary Dressing ABD Pad Dry Gauze 6. Support Layer [employer] Notes netting. Electronic Signature(s) Signed: 09/19/2019 5:14:15 PM By: Deon Pilling Entered By: Deon Pilling on 09/19/2019 08:13:45 -------------------------------------------------------------------------------- Vitals Details Patient Name: Date of Service: Daniel Pia D. 09/19/2019 8:00 AM Medical Record [id number] Patient Account Number: [id number] Date of Birth/Sex: Treating RN: [birth year] (78 y.o. [name] Primary Care Humbert Morozov: [place] Other Clinician: Referring Lorella Gomez: Treating Azura Tufaro/Extender:Robson, Cheryl Flash, [place] Weeks in Treatment: 29 Vital Signs Time Taken: 08:05 Temperature (F): 97.8 Height (in): 74 Pulse (bpm): 87 Weight (lbs): 212 Respiratory Rate (breaths/min): 22 Body Mass Index (BMI): 27.2 Blood Pressure (mmHg): 130/90 Reference Range: 80 - 120 mg / dl Electronic Signature(s) Signed: 09/19/2019 5:14:15 PM By: Deon Pilling Entered By: Deon Pilling on  09/19/2019 08:09:35

## 2019-09-20 ENCOUNTER — Other Ambulatory Visit (INDEPENDENT_AMBULATORY_CARE_PROVIDER_SITE_OTHER): Payer: Self-pay | Admitting: Internal Medicine

## 2019-09-25 ENCOUNTER — Encounter: Payer: Self-pay | Admitting: Cardiology

## 2019-09-25 ENCOUNTER — Ambulatory Visit (HOSPITAL_COMMUNITY)
Admission: RE | Admit: 2019-09-25 | Discharge: 2019-09-25 | Disposition: A | Discharge status: Home / Self Care | Payer: Medicare HMO | Source: Ambulatory Visit | Attending: Cardiology | Admitting: Cardiology

## 2019-09-25 ENCOUNTER — Other Ambulatory Visit: Payer: Self-pay

## 2019-09-25 ENCOUNTER — Telehealth: Payer: Self-pay | Admitting: Cardiology

## 2019-09-25 ENCOUNTER — Ambulatory Visit (INDEPENDENT_AMBULATORY_CARE_PROVIDER_SITE_OTHER): Payer: Medicare HMO | Admitting: Cardiology

## 2019-09-25 VITALS — BP 137/88 | HR 105 | Temp 98.1°F | Ht 74.5 in | Wt 227.0 lb

## 2019-09-25 DIAGNOSIS — I420 Dilated cardiomyopathy: Secondary | ICD-10-CM | POA: Diagnosis not present

## 2019-09-25 DIAGNOSIS — I4821 Permanent atrial fibrillation: Secondary | ICD-10-CM

## 2019-09-25 DIAGNOSIS — Z7901 Long term (current) use of anticoagulants: Secondary | ICD-10-CM | POA: Diagnosis not present

## 2019-09-25 DIAGNOSIS — I872 Venous insufficiency (chronic) (peripheral): Secondary | ICD-10-CM | POA: Diagnosis not present

## 2019-09-25 DIAGNOSIS — L97211 Non-pressure chronic ulcer of right calf limited to breakdown of skin: Secondary | ICD-10-CM

## 2019-09-25 DIAGNOSIS — I447 Left bundle-branch block, unspecified: Secondary | ICD-10-CM | POA: Diagnosis not present

## 2019-09-25 DIAGNOSIS — I10 Essential (primary) hypertension: Secondary | ICD-10-CM | POA: Diagnosis not present

## 2019-09-25 DIAGNOSIS — R0602 Shortness of breath: Secondary | ICD-10-CM | POA: Diagnosis not present

## 2019-09-25 DIAGNOSIS — E782 Mixed hyperlipidemia: Secondary | ICD-10-CM | POA: Diagnosis not present

## 2019-09-25 DIAGNOSIS — I5022 Chronic systolic (congestive) heart failure: Secondary | ICD-10-CM | POA: Diagnosis not present

## 2019-09-25 MED ORDER — CARVEDILOL 25 MG PO TABS: 37.5000 mg | ORAL_TABLET | Freq: Two times a day (BID) | ORAL | 3 refills | Status: DC

## 2019-09-25 NOTE — Patient Instructions (Signed)
Medication Instructions:  Increase COREG TO 37.5 MG (1 1/2 TABLETS) TWO TIMES DAILY   Labwork:TOMORROW   BMET BNP CBC   Testing/Procedures: A chest x-ray takes a picture of the organs and structures inside the chest, including the heart, lungs, and blood vessels. This test can show several things, including, whether the heart is enlarges; whether fluid is building up in the lungs; and whether pacemaker / defibrillator leads are still in place.  Your physician has requested that you have a cardiac catheterization. Cardiac catheterization is used to diagnose and/or treat various heart conditions. Doctors may recommend this procedure for a number of different reasons. The most common reason is to evaluate chest pain. Chest pain can be a symptom of coronary artery disease (CAD), and cardiac catheterization can show whether plaque is narrowing or blocking your heart's arteries. This procedure is also used to evaluate the valves, as well as measure the blood flow and oxygen levels in different parts of your heart. For further information please visit [email or web address]. Please follow instruction sheet, as given.    Follow-Up: Your physician recommends that you schedule a follow-up appointment in: 2 WEEKS POST CATH  Your physician recommends that you schedule a follow-up appointment in: INR CHECK 3 DAYS AFTER CATH     Any Other Special Instructions Will Be Listed Below (If Applicable).     If you need a refill on your cardiac medications before your next appointment, please call your pharmacy.

## 2019-09-25 NOTE — Progress Notes (Signed)
Cardiology Office Note   Date:  09/25/2019   ID:  MARKIS HOWERY, DOB March 18, 1941, MRN [id number]  PCP:  Doree Albee, MD  Cardiologist:  Dr. Bronson Ing     Chief Complaint  Patient presents with  . Cardiomyopathy      History of Present Illness: [name] is a 78 y.o. male who presents for new cardiomyopathy though hx of EF 40-45% in 2016.    History of permanent atrial fibrillation on coumadin, chronic systolic heart failure, hypertension, hyperlipidemia, presumed nonischemic cardiomyopathy with a EF of 40 to 45%.  Recently seen March 15, 2019 by Dr. [name] for suspicion of venous stasis ulcers and evidence of venous stasis changes in addition to skin breakdown.  Skin breakdown  in the back of his calfs bilaterally.  Recently had normal ABIs per Dr. [name].  He had diminished pedal pulses bilaterally.  Patient had an intermediate risk stress study December 2019 showing a medium size defect . There were no ischemic zones per report.  EF was estimated at 43%.  Patient states he stopped taking his Lasix 3 months ago secondary to frequent urination.  He states his edema has returned and he is going to wound clinic for treatment of ulcers on the back of both calves and his legs.  He has venous insufficiency but there have been no studies performed.  Patient states he has been having some mild dyspnea on exertion but he is not very active on a daily basis.  States he has had some issues with bronchitis recently. Was on zithromax and prednisone.  No progressive anginal symptoms.    Echo was done and EF 20-25%, mildly increased LVH  LBBB, mildly dilated LV cavity size. Mild MR, moderate TR  Pt was placed on aldactone.  Here today to review echo and plan for further eval and medication adjustments.    He is no longer on ABX   Today we reviewed Echo results and discussed cardiomyopathy.  He feels better and legs with less edema per pt and his daughter.  He is SOB with  talking here in the office, he is able to sleep on 1 pillow at home and his legs are elevated.  He can ambulate in home without SOB though his activity is limited.  He has leg ulcers followed by wound care and una boots in place.  His HR 105 on arrival.  Chronic a fib.  Discussed low salt diet.    Past Medical History:  Diagnosis Date  . Cancer ([place])   . CHF (congestive heart failure) ([place])   . Chronic atrial fibrillation ([place])   . Chronic bronchitis   . Diabetes mellitus, type II ([place])    no insulin  . Gout   . History of herpes zoster virus   . History of radiation therapy 12/21/12- 02/15/13   prostate 78 gray in 40 fx  . Hyperlipidemia   . Hypertension   . Prostate cancer ([place]) 2014   EBRT + hormonal therapy  . Vitamin D deficiency disease 06/07/2019    Past Surgical History:  Procedure Laterality Date  . KNEE SURGERY     left knee  . PROSTATE BIOPSY       Current Outpatient Medications  Medication Sig Dispense Refill  . acetaminophen (TYLENOL) 500 MG tablet Take 500 mg by mouth every 6 (six) hours as needed.      Marland Kitchen allopurinol (ZYLOPRIM) 300 MG tablet TAKE 1 TABLET TWICE DAILY 180 tablet 0  . ascorbic  acid (VITAMIN C) 250 MG CHEW Chew 500 mg by mouth daily.    Marland Kitchen azithromycin (ZITHROMAX) 250 MG tablet Take 1 tablet (250 mg total) by mouth daily. Take first 2 tablets together, then 1 every day until finished. 6 tablet 0  . carvedilol (COREG) 25 MG tablet Take 1 tablet (25 mg total) by mouth 2 (two) times daily. 180 tablet 3  . Cholecalciferol (VITAMIN D-3) 125 MCG (5000 UT) TABS Take 5,000 Units by mouth daily at 12 noon.    . fexofenadine (ALLEGRA) 180 MG tablet Take 180 mg by mouth daily.    Marland Kitchen glipiZIDE (GLUCOTROL) 5 MG tablet TAKE 1 TABLET EVERY DAY 90 tablet 0  . guaifenesin (WAL-TUSSIN) 100 MG/5ML syrup Take 200 mg by mouth 3 (three) times daily as needed.      Marland Kitchen lisinopril (ZESTRIL) 20 MG tablet TAKE 1 TABLET TWICE DAILY 180 tablet 0  . lovastatin (MEVACOR) 40 MG tablet  TAKE 1 TABLET EVERY DAY 90 tablet 0  . NON FORMULARY Take 3 tablets by mouth daily. MANGA-CAL    . predniSONE (DELTASONE) 10 MG tablet Take 2 tablets (20 mg total) by mouth daily. 15 tablet 0  . spironolactone (ALDACTONE) 25 MG tablet Take 1 tablet (25 mg total) by mouth daily. 90 tablet 3  . vitamin E 400 UNIT capsule Take 1,000 Units by mouth daily.     Marland Kitchen warfarin (COUMADIN) 5 MG tablet TAKE 1 TABLET EVERY DAY 90 tablet 0  . zinc gluconate 50 MG tablet Take 50 mg by mouth daily. Takes 1/2 tablet daily     No current facility-administered medications for this visit.    Allergies:   Clarithromycin and Fluoride preparations    Social History:  The patient  reports that he has never smoked. He has never used smokeless tobacco. He reports that he does not drink alcohol or use drugs.   Family History:  The patient's family history includes Heart attack in his father; Kidney failure in his mother.    ROS:  General:no colds or fevers, + weight increase Skin:no rashes or ulcers HEENT:no blurred vision, no congestion CV:see HPI PUL:see HPI GI:no diarrhea constipation or melena, no indigestion GU:no hematuria, no dysuria MS:no joint pain, no claudication Neuro:no syncope, no lightheadedness Endo:+ diabetes he does not check CBGs, no thyroid disease  Wt Readings from Last 3 Encounters:  09/25/19 227 lb (103 kg)  09/13/19 223 lb (101.2 kg)  06/07/19 205 lb 6.4 oz (93.2 kg)     PHYSICAL EXAM: VS:  BP 137/88   Pulse (!) 105   Temp 98.1 F (36.7 C) (Temporal)   Ht 6' 2.5" (1.892 m)   Wt 227 lb (103 kg)   SpO2 98%   BMI 28.76 kg/m  , BMI Body mass index is 28.76 kg/m. General:Pleasant affect, NAD Skin:Warm and dry, brisk capillary refill HEENT:normocephalic, sclera clear, mucus membranes moist Neck:supple, no JVD, no bruits sitting in wheel chair Heart:irreg irreg  With 2/6 systolic murmur, no gallup, rub or click Lungs:clear without rales, rhonchi, or wheezes [id number], non  tender, + BS, do not palpate liver spleen or masses Ext:+ lower ext edema with una boots in place, 2+ radial pulses Neuro:alert and oriented X 3, MAE, follows commands, + facial symmetry    EKG:  EKG is ordered today. The ekg ordered today demonstrates a fib at 65 and LBBB   Recent Labs: 06/07/2019: ALT 42; BUN 28; Creat 1.14; Potassium 4.5; Sodium 141    Lipid Panel  Component Value Date/Time   CHOL 196 02/13/2011 1055   TRIG 92 02/13/2011 1055   TRIG 110 01/21/2009 0000   HDL 46 02/13/2011 1055   CHOLHDL 4.3 02/13/2011 1055   VLDL 18 02/13/2011 1055   LDLCALC 132 (H) 02/13/2011 1055   LDLCALC 152 01/21/2009 0000       Other studies Reviewed: Additional studies/ records that were reviewed today include: . Echo 09/15/19 IMPRESSIONS    1. Left ventricular ejection fraction, by visual estimation, is 20 to 25%. The left ventricle has severely decreased function. There is mildly increased left ventricular hypertrophy.  2. Abnormal septal motion consistent with left bundle branch block.  3. Left ventricular diastolic parameters are indeterminate.  4. Mildly dilated left ventricular internal cavity size.  5. The left ventricle demonstrates global hypokinesis.  6. Global right ventricle has normal systolic function.The right ventricular size is normal. No increase in right ventricular wall thickness.  7. Left atrial size was severely dilated.  8. Right atrial size was severely dilated.  9. Mild mitral annular calcification. 10. The mitral valve is grossly normal. Mild mitral valve regurgitation. 11. The tricuspid valve is grossly normal. Tricuspid valve regurgitation moderate. 12. The aortic valve is tricuspid and moderately calcificed with decreased cusp excursion. Gradients are not significantly increased, but in the setting of reduced LVEF, difficult to exclude low gradient aortic stenosis. Aortic valve regurgitation is not  visualized. 13. The pulmonic valve was grossly  normal. Pulmonic valve regurgitation is trivial. 14. Moderately elevated pulmonary artery systolic pressure. 15. The tricuspid regurgitant velocity is 3.23 m/s, and with an assumed right atrial pressure of 15 mmHg, the estimated right ventricular systolic pressure is moderately elevated at 56.7 mmHg.  FINDINGS  Left Ventricle: Left ventricular ejection fraction, by visual estimation, is 20 to 25%. The left ventricle has severely decreased function. The left ventricle demonstrates global hypokinesis. The left ventricular internal cavity size was mildly dilated  left ventricle. There is mildly increased left ventricular hypertrophy. Abnormal (paradoxical) septal motion, consistent with left bundle branch block. Left ventricular diastolic parameters are indeterminate.  Right Ventricle: The right ventricular size is normal. No increase in right ventricular wall thickness. Global RV systolic function is has normal systolic function. The tricuspid regurgitant velocity is 3.23 m/s, and with an assumed right atrial pressure  of 15 mmHg, the estimated right ventricular systolic pressure is moderately elevated at 56.7 mmHg.  Left Atrium: Left atrial size was severely dilated.  Right Atrium: Right atrial size was severely dilated  Pericardium: There is no evidence of pericardial effusion.  Mitral Valve: The mitral valve is grossly normal. Mild mitral annular calcification. Mild mitral valve regurgitation.  Tricuspid Valve: The tricuspid valve is grossly normal. Tricuspid valve regurgitation moderate.  Aortic Valve: The aortic valve is tricuspid. Aortic valve regurgitation is not visualized. Mild to moderate aortic valve annular calcification.  Pulmonic Valve: The pulmonic valve was grossly normal. Pulmonic valve regurgitation is trivial. Pulmonic regurgitation is trivial.  Aorta: The aortic root is normal in size and structure.  IAS/Shunts: No atrial level shunt detected by color flow  Doppler.      ASSESSMENT AND PLAN:  1.  Cardiomyopathy LV dilated, on BB but with increased HR will increase to 37.5 mg BID, discussed with Dr. Harl Bowie the DOD- med changes and cath. He is on ACE and goal will be to change to entresto but will hold off for now until after cardiac cath.  Discussed need to eval for ischemic disease and will do Rt  hear cath as well.  This will help in treatment plan.  Check bmp and CBC along with covid screen.  The patient understands that risks included but are not limited to stroke (1 in 1000), death (1 in 2), kidney failure [usually temporary] (1 in 500), bleeding (1 in 200), allergic reaction [possibly serious] (1 in 200).    2.  Permanent a fib on coumadin-- rate fast today this may contribute to his cardiomyopathy.  Increase BB as above.  We discussed changing coumadin to eliquis but he is doing well on coumadin and his PCP had discussed before at this time he prefers to continue coumadin.  Will hold 4 days prior to cath, and schedule coumadin visit 3 days after on coumadin.   3.  SOB and edema.  He feels his edema is improved on aldactone. Will check CXR, BNP to further eval CHF.  He may need lasix prior to the cardiac cath.  Recent treatment for bronchitis off prednisone and ABX  4.  Diabetes per PCP   5.  HLD on mevacor.  6.  Venous insuff of lower ext with wound care and una boots, to see wound care tomorrow  7.  anticoagulation on coumadin  8. Chronic LBBB  Addendum, Dr. Dellia Nims called and stated pt's edema was 2+ up into thighs.  With elevated BNP and pl effusion on CXR will add lasix 40 daily.  Recheck BMP on Monday.   Current medicines are reviewed with the patient today.  The patient Has no concerns regarding medicines.  The following changes have been made:  See above Labs/ tests ordered today include:see above  Disposition:   FU:  see above  Signed, Cecilie Kicks, NP  09/25/2019 3:44 PM    [facility] [place], [place], [place], [place] Phone: [phone]; Fax: [phone]

## 2019-09-25 NOTE — H&P (View-Only) (Signed)
Cardiology Office Note   Date:  09/25/2019   ID:  [name], DOB 31-Jan-1941, MRN [id number]  PCP:  Doree Albee, MD  Cardiologist:  Dr. Bronson Ing     Chief Complaint  Patient presents with  . Cardiomyopathy      History of Present Illness: [name] is a 78 y.o. male who presents for new cardiomyopathy though hx of EF 40-45% in 2016.    History of permanent atrial fibrillation on coumadin, chronic systolic heart failure, hypertension, hyperlipidemia, presumed nonischemic cardiomyopathy with a EF of 40 to 45%.  Recently seen March 15, 2019 by Dr. [name] for suspicion of venous stasis ulcers and evidence of venous stasis changes in addition to skin breakdown.  Skin breakdown  in the back of his calfs bilaterally.  Recently had normal ABIs per Dr. [name].  He had diminished pedal pulses bilaterally.  Patient had an intermediate risk stress study December 2019 showing a medium size defect . There were no ischemic zones per report.  EF was estimated at 43%.  Patient states he stopped taking his Lasix 3 months ago secondary to frequent urination.  He states his edema has returned and he is going to wound clinic for treatment of ulcers on the back of both calves and his legs.  He has venous insufficiency but there have been no studies performed.  Patient states he has been having some mild dyspnea on exertion but he is not very active on a daily basis.  States he has had some issues with bronchitis recently. Was on zithromax and prednisone.  No progressive anginal symptoms.    Echo was done and EF 20-25%, mildly increased LVH  LBBB, mildly dilated LV cavity size. Mild MR, moderate TR  Pt was placed on aldactone.  Here today to review echo and plan for further eval and medication adjustments.    He is no longer on ABX   Today we reviewed Echo results and discussed cardiomyopathy.  He feels better and legs with less edema per pt and his daughter.  He is SOB with  talking here in the office, he is able to sleep on 1 pillow at home and his legs are elevated.  He can ambulate in home without SOB though his activity is limited.  He has leg ulcers followed by wound care and una boots in place.  His HR 105 on arrival.  Chronic a fib.  Discussed low salt diet.    Past Medical History:  Diagnosis Date  . Cancer ([place])   . CHF (congestive heart failure) ([place])   . Chronic atrial fibrillation ([place])   . Chronic bronchitis   . Diabetes mellitus, type II ([place])    no insulin  . Gout   . History of herpes zoster virus   . History of radiation therapy 12/21/12- 02/15/13   prostate 78 gray in 40 fx  . Hyperlipidemia   . Hypertension   . Prostate cancer ([place]) 2014   EBRT + hormonal therapy  . Vitamin D deficiency disease 06/07/2019    Past Surgical History:  Procedure Laterality Date  . KNEE SURGERY     left knee  . PROSTATE BIOPSY       Current Outpatient Medications  Medication Sig Dispense Refill  . acetaminophen (TYLENOL) 500 MG tablet Take 500 mg by mouth every 6 (six) hours as needed.      Marland Kitchen allopurinol (ZYLOPRIM) 300 MG tablet TAKE 1 TABLET TWICE DAILY 180 tablet 0  . ascorbic  acid (VITAMIN C) 250 MG CHEW Chew 500 mg by mouth daily.    Marland Kitchen azithromycin (ZITHROMAX) 250 MG tablet Take 1 tablet (250 mg total) by mouth daily. Take first 2 tablets together, then 1 every day until finished. 6 tablet 0  . carvedilol (COREG) 25 MG tablet Take 1 tablet (25 mg total) by mouth 2 (two) times daily. 180 tablet 3  . Cholecalciferol (VITAMIN D-3) 125 MCG (5000 UT) TABS Take 5,000 Units by mouth daily at 12 noon.    . fexofenadine (ALLEGRA) 180 MG tablet Take 180 mg by mouth daily.    Marland Kitchen glipiZIDE (GLUCOTROL) 5 MG tablet TAKE 1 TABLET EVERY DAY 90 tablet 0  . guaifenesin (WAL-TUSSIN) 100 MG/5ML syrup Take 200 mg by mouth 3 (three) times daily as needed.      Marland Kitchen lisinopril (ZESTRIL) 20 MG tablet TAKE 1 TABLET TWICE DAILY 180 tablet 0  . lovastatin (MEVACOR) 40 MG tablet  TAKE 1 TABLET EVERY DAY 90 tablet 0  . NON FORMULARY Take 3 tablets by mouth daily. MANGA-CAL    . predniSONE (DELTASONE) 10 MG tablet Take 2 tablets (20 mg total) by mouth daily. 15 tablet 0  . spironolactone (ALDACTONE) 25 MG tablet Take 1 tablet (25 mg total) by mouth daily. 90 tablet 3  . vitamin E 400 UNIT capsule Take 1,000 Units by mouth daily.     Marland Kitchen warfarin (COUMADIN) 5 MG tablet TAKE 1 TABLET EVERY DAY 90 tablet 0  . zinc gluconate 50 MG tablet Take 50 mg by mouth daily. Takes 1/2 tablet daily     No current facility-administered medications for this visit.    Allergies:   Clarithromycin and Fluoride preparations    Social History:  The patient  reports that he has never smoked. He has never used smokeless tobacco. He reports that he does not drink alcohol or use drugs.   Family History:  The patient's family history includes Heart attack in his father; Kidney failure in his mother.    ROS:  General:no colds or fevers, + weight increase Skin:no rashes or ulcers HEENT:no blurred vision, no congestion CV:see HPI PUL:see HPI GI:no diarrhea constipation or melena, no indigestion GU:no hematuria, no dysuria MS:no joint pain, no claudication Neuro:no syncope, no lightheadedness Endo:+ diabetes he does not check CBGs, no thyroid disease  Wt Readings from Last 3 Encounters:  09/25/19 227 lb (103 kg)  09/13/19 223 lb (101.2 kg)  06/07/19 205 lb 6.4 oz (93.2 kg)     PHYSICAL EXAM: VS:  BP 137/88   Pulse (!) 105   Temp 98.1 F (36.7 C) (Temporal)   Ht 6' 2.5" (1.892 m)   Wt 227 lb (103 kg)   SpO2 98%   BMI 28.76 kg/m  , BMI Body mass index is 28.76 kg/m. General:Pleasant affect, NAD Skin:Warm and dry, brisk capillary refill HEENT:normocephalic, sclera clear, mucus membranes moist Neck:supple, no JVD, no bruits sitting in wheel chair Heart:irreg irreg  With 2/6 systolic murmur, no gallup, rub or click Lungs:clear without rales, rhonchi, or wheezes [id number], non  tender, + BS, do not palpate liver spleen or masses Ext:+ lower ext edema with una boots in place, 2+ radial pulses Neuro:alert and oriented X 3, MAE, follows commands, + facial symmetry    EKG:  EKG is ordered today. The ekg ordered today demonstrates a fib at 73 and LBBB   Recent Labs: 06/07/2019: ALT 42; BUN 28; Creat 1.14; Potassium 4.5; Sodium 141    Lipid Panel  Component Value Date/Time   CHOL 196 02/13/2011 1055   TRIG 92 02/13/2011 1055   TRIG 110 01/21/2009 0000   HDL 46 02/13/2011 1055   CHOLHDL 4.3 02/13/2011 1055   VLDL 18 02/13/2011 1055   LDLCALC 132 (H) 02/13/2011 1055   LDLCALC 152 01/21/2009 0000       Other studies Reviewed: Additional studies/ records that were reviewed today include: . Echo 09/15/19 IMPRESSIONS    1. Left ventricular ejection fraction, by visual estimation, is 20 to 25%. The left ventricle has severely decreased function. There is mildly increased left ventricular hypertrophy.  2. Abnormal septal motion consistent with left bundle branch block.  3. Left ventricular diastolic parameters are indeterminate.  4. Mildly dilated left ventricular internal cavity size.  5. The left ventricle demonstrates global hypokinesis.  6. Global right ventricle has normal systolic function.The right ventricular size is normal. No increase in right ventricular wall thickness.  7. Left atrial size was severely dilated.  8. Right atrial size was severely dilated.  9. Mild mitral annular calcification. 10. The mitral valve is grossly normal. Mild mitral valve regurgitation. 11. The tricuspid valve is grossly normal. Tricuspid valve regurgitation moderate. 12. The aortic valve is tricuspid and moderately calcificed with decreased cusp excursion. Gradients are not significantly increased, but in the setting of reduced LVEF, difficult to exclude low gradient aortic stenosis. Aortic valve regurgitation is not  visualized. 13. The pulmonic valve was grossly  normal. Pulmonic valve regurgitation is trivial. 14. Moderately elevated pulmonary artery systolic pressure. 15. The tricuspid regurgitant velocity is 3.23 m/s, and with an assumed right atrial pressure of 15 mmHg, the estimated right ventricular systolic pressure is moderately elevated at 56.7 mmHg.  FINDINGS  Left Ventricle: Left ventricular ejection fraction, by visual estimation, is 20 to 25%. The left ventricle has severely decreased function. The left ventricle demonstrates global hypokinesis. The left ventricular internal cavity size was mildly dilated  left ventricle. There is mildly increased left ventricular hypertrophy. Abnormal (paradoxical) septal motion, consistent with left bundle branch block. Left ventricular diastolic parameters are indeterminate.  Right Ventricle: The right ventricular size is normal. No increase in right ventricular wall thickness. Global RV systolic function is has normal systolic function. The tricuspid regurgitant velocity is 3.23 m/s, and with an assumed right atrial pressure  of 15 mmHg, the estimated right ventricular systolic pressure is moderately elevated at 56.7 mmHg.  Left Atrium: Left atrial size was severely dilated.  Right Atrium: Right atrial size was severely dilated  Pericardium: There is no evidence of pericardial effusion.  Mitral Valve: The mitral valve is grossly normal. Mild mitral annular calcification. Mild mitral valve regurgitation.  Tricuspid Valve: The tricuspid valve is grossly normal. Tricuspid valve regurgitation moderate.  Aortic Valve: The aortic valve is tricuspid. Aortic valve regurgitation is not visualized. Mild to moderate aortic valve annular calcification.  Pulmonic Valve: The pulmonic valve was grossly normal. Pulmonic valve regurgitation is trivial. Pulmonic regurgitation is trivial.  Aorta: The aortic root is normal in size and structure.  IAS/Shunts: No atrial level shunt detected by color flow  Doppler.      ASSESSMENT AND PLAN:  1.  Cardiomyopathy LV dilated, on BB but with increased HR will increase to 37.5 mg BID, discussed with Dr. Harl Bowie the DOD- med changes and cath. He is on ACE and goal will be to change to entresto but will hold off for now until after cardiac cath.  Discussed need to eval for ischemic disease and will do Rt  hear cath as well.  This will help in treatment plan.  Check bmp and CBC along with covid screen.  The patient understands that risks included but are not limited to stroke (1 in 1000), death (1 in 57), kidney failure [usually temporary] (1 in 500), bleeding (1 in 200), allergic reaction [possibly serious] (1 in 200).    2.  Permanent a fib on coumadin-- rate fast today this may contribute to his cardiomyopathy.  Increase BB as above.  We discussed changing coumadin to eliquis but he is doing well on coumadin and his PCP had discussed before at this time he prefers to continue coumadin.  Will hold 4 days prior to cath, and schedule coumadin visit 3 days after on coumadin.   3.  SOB and edema.  He feels his edema is improved on aldactone. Will check CXR, BNP to further eval CHF.  He may need lasix prior to the cardiac cath.  Recent treatment for bronchitis off prednisone and ABX  4.  Diabetes per PCP   5.  HLD on mevacor.  6.  Venous insuff of lower ext with wound care and una boots, to see wound care tomorrow  7.  anticoagulation on coumadin  8. Chronic LBBB  Addendum, Dr. Dellia Nims called and stated pt's edema was 2+ up into thighs.  With elevated BNP and pl effusion on CXR will add lasix 40 daily.  Recheck BMP on Monday.   Current medicines are reviewed with the patient today.  The patient Has no concerns regarding medicines.  The following changes have been made:  See above Labs/ tests ordered today include:see above  Disposition:   FU:  see above  Signed, Cecilie Kicks, NP  09/25/2019 3:44 PM    [facility] [place], [place], [place], [place] Phone: [phone]; Fax: [phone]

## 2019-09-26 ENCOUNTER — Encounter (HOSPITAL_BASED_OUTPATIENT_CLINIC_OR_DEPARTMENT_OTHER): Payer: Medicare HMO | Admitting: Internal Medicine

## 2019-09-26 ENCOUNTER — Telehealth: Payer: Self-pay | Admitting: Cardiology

## 2019-09-26 ENCOUNTER — Other Ambulatory Visit (HOSPITAL_COMMUNITY)
Admission: RE | Admit: 2019-09-26 | Discharge: 2019-09-26 | Disposition: A | Discharge status: Home / Self Care | Payer: Medicare HMO | Source: Ambulatory Visit | Attending: Cardiology | Admitting: Cardiology

## 2019-09-26 DIAGNOSIS — I4821 Permanent atrial fibrillation: Secondary | ICD-10-CM | POA: Insufficient documentation

## 2019-09-26 DIAGNOSIS — R0602 Shortness of breath: Secondary | ICD-10-CM | POA: Diagnosis not present

## 2019-09-26 DIAGNOSIS — E11621 Type 2 diabetes mellitus with foot ulcer: Secondary | ICD-10-CM | POA: Diagnosis not present

## 2019-09-26 DIAGNOSIS — L97822 Non-pressure chronic ulcer of other part of left lower leg with fat layer exposed: Secondary | ICD-10-CM | POA: Diagnosis not present

## 2019-09-26 DIAGNOSIS — L97521 Non-pressure chronic ulcer of other part of left foot limited to breakdown of skin: Secondary | ICD-10-CM | POA: Diagnosis not present

## 2019-09-26 DIAGNOSIS — L97212 Non-pressure chronic ulcer of right calf with fat layer exposed: Secondary | ICD-10-CM | POA: Diagnosis not present

## 2019-09-26 DIAGNOSIS — I87311 Chronic venous hypertension (idiopathic) with ulcer of right lower extremity: Secondary | ICD-10-CM | POA: Diagnosis not present

## 2019-09-26 DIAGNOSIS — L97222 Non-pressure chronic ulcer of left calf with fat layer exposed: Secondary | ICD-10-CM | POA: Diagnosis not present

## 2019-09-26 DIAGNOSIS — L97522 Non-pressure chronic ulcer of other part of left foot with fat layer exposed: Secondary | ICD-10-CM | POA: Diagnosis not present

## 2019-09-26 DIAGNOSIS — I890 Lymphedema, not elsewhere classified: Secondary | ICD-10-CM | POA: Diagnosis not present

## 2019-09-26 DIAGNOSIS — E11622 Type 2 diabetes mellitus with other skin ulcer: Secondary | ICD-10-CM | POA: Diagnosis not present

## 2019-09-26 DIAGNOSIS — L97512 Non-pressure chronic ulcer of other part of right foot with fat layer exposed: Secondary | ICD-10-CM | POA: Diagnosis not present

## 2019-09-26 DIAGNOSIS — L97211 Non-pressure chronic ulcer of right calf limited to breakdown of skin: Secondary | ICD-10-CM | POA: Diagnosis not present

## 2019-09-26 DIAGNOSIS — I87333 Chronic venous hypertension (idiopathic) with ulcer and inflammation of bilateral lower extremity: Secondary | ICD-10-CM | POA: Diagnosis not present

## 2019-09-26 LAB — CBC WITH DIFFERENTIAL/PLATELET
Abs Immature Granulocytes: 0.04 10*3/uL (ref 0.00–0.07)
Basophils Absolute: 0 10*3/uL (ref 0.0–0.1)
Basophils Relative: 0 %
Eosinophils Absolute: 0.1 10*3/uL (ref 0.0–0.5)
Eosinophils Relative: 1 %
HCT: 42.7 % (ref 39.0–52.0)
Hemoglobin: 13.3 g/dL (ref 13.0–17.0)
Immature Granulocytes: 1 %
Lymphocytes Relative: 14 %
Lymphs Abs: 1.2 10*3/uL (ref 0.7–4.0)
MCH: 33.8 pg (ref 26.0–34.0)
MCHC: 31.1 g/dL (ref 30.0–36.0)
MCV: 108.7 fL — ABNORMAL HIGH (ref 80.0–100.0)
Monocytes Absolute: 0.8 10*3/uL (ref 0.1–1.0)
Monocytes Relative: 10 %
Neutro Abs: 6.4 10*3/uL (ref 1.7–7.7)
Neutrophils Relative %: 74 %
Platelets: 199 10*3/uL (ref 150–400)
RBC: 3.93 MIL/uL — ABNORMAL LOW (ref 4.22–5.81)
RDW: 14.4 % (ref 11.5–15.5)
WBC: 8.6 10*3/uL (ref 4.0–10.5)
nRBC: 0 % (ref 0.0–0.2)

## 2019-09-26 LAB — BASIC METABOLIC PANEL
Anion gap: 9 (ref 5–15)
BUN: 36 mg/dL — ABNORMAL HIGH (ref 8–23)
CO2: 27 mmol/L (ref 22–32)
Calcium: 9.1 mg/dL (ref 8.9–10.3)
Chloride: 106 mmol/L (ref 98–111)
Creatinine, Ser: 1.17 mg/dL (ref 0.61–1.24)
GFR calc Af Amer: >60 mL/min (ref >60)
GFR calc non Af Amer: 60 mL/min — ABNORMAL LOW (ref >60)
Glucose, Bld: 116 mg/dL — ABNORMAL HIGH (ref 70–99)
Potassium: 4.8 mmol/L (ref 3.5–5.1)
Sodium: 142 mmol/L (ref 135–145)

## 2019-09-26 LAB — BRAIN NATRIURETIC PEPTIDE: B Natriuretic Peptide: 1263 pg/mL — ABNORMAL HIGH (ref 0.0–100.0)

## 2019-09-26 NOTE — Progress Notes (Signed)
[name] ([id number]) Visit Report for 09/26/2019 Debridement Details Patient Name: Date of Service: [name] 09/26/2019 8:15 AM Medical Record [place] Patient Account Number: [id number] Date of Birth/Sex: 01-21-1941 (78 y.o. M) Treating RN: Primary Care Provider: Hurshel Party Other Clinician: Referring Provider: Treating Provider/Extender:Robson, Cheryl Flash, [place] Weeks in Treatment: 30 Debridement Performed for Wound #13 Left Toe Second Assessment: Performed By: Physician Ricard Dillon., MD Debridement Type: Debridement Severity of Tissue Pre Fat layer exposed Debridement: Level of Consciousness (Pre- Awake and Alert procedure): Pre-procedure Verification/Time Out Taken: Yes - 09:19 Start Time: 09:19 Pain Control: Other : benzocaine 20% Total Area Debrided (L x W): 1.3 (cm) x 0.9 (cm) = 1.17 (cm) Tissue and other material Viable, Non-Viable, Slough, Subcutaneous, Slough debrided: Level: Skin/Subcutaneous Tissue Debridement Description: Excisional Instrument: Curette Bleeding: Minimum Hemostasis Achieved: Pressure End Time: 09:27 Procedural Pain: 0 Post Procedural Pain: 0 Response to Treatment: Procedure was tolerated well Level of Consciousness Awake and Alert (Post-procedure): Post Debridement Measurements of Total Wound Length: (cm) 1.3 Width: (cm) 0.9 Depth: (cm) 0.1 Volume: (cm) 0.092 Character of Wound/Ulcer Post Improved Debridement: Severity of Tissue Post Debridement: Fat layer exposed Post Procedure Diagnosis Same as Pre-procedure Electronic Signature(s) Signed: 09/26/2019 6:15:30 PM By: [name] MD Entered By: [name] on 09/26/2019 09:54:05 -------------------------------------------------------------------------------- Debridement Details Patient Name: Date of Service: Daniel Pia D. 09/26/2019 8:15 AM Medical Record [id number] Patient Account Number: [id number] Date of  Birth/Sex: 28-May-1941 (78 y.o. M) Treating RN: Primary Care Provider: Hurshel Party Other Clinician: Referring Provider: Treating Provider/Extender:Robson, Cheryl Flash, [place] Weeks in Treatment: 30 Debridement Performed for Wound #16 Left,Distal,Anterior Lower Leg Assessment: Performed By: Physician Ricard Dillon., MD Debridement Type: Debridement Severity of Tissue Pre Fat layer exposed Debridement: Level of Consciousness (Pre- Awake and Alert procedure): Pre-procedure Yes - 09:19 Verification/Time Out Taken: Start Time: 09:19 Pain Control: Other : benzocaine 20% Total Area Debrided (L x W): 0.7 (cm) x 3.2 (cm) = 2.24 (cm) Tissue and other material Viable, Non-Viable, Slough, Subcutaneous, Slough debrided: Level: Skin/Subcutaneous Tissue Debridement Description: Excisional Instrument: Curette Bleeding: Minimum Hemostasis Achieved: Pressure End Time: 09:27 Procedural Pain: 0 Post Procedural Pain: 0 Response to Treatment: Procedure was tolerated well Level of Consciousness Awake and Alert (Post-procedure): Post Debridement Measurements of Total Wound Length: (cm) 0.7 Width: (cm) 3.2 Depth: (cm) 0.1 Volume: (cm) 0.176 Character of Wound/Ulcer Post Improved Debridement: Severity of Tissue Post Debridement: Fat layer exposed Post Procedure Diagnosis Same as Pre-procedure Electronic Signature(s) Signed: 09/26/2019 6:15:30 PM By: [name] MD Entered By: [name] on 09/26/2019 09:54:18 -------------------------------------------------------------------------------- Debridement Details Patient Name: Date of Service: Daniel Pia D. 09/26/2019 8:15 AM Medical Record [id number] Patient Account Number: [id number] Date of Birth/Sex: Feb 15, 1941 (78 y.o. M) Treating RN: Primary Care Provider: Hurshel Party Other Clinician: Referring Provider: Treating Provider/Extender:Robson, Cheryl Flash, [place] Weeks in Treatment:  30 Debridement Performed for Wound #3R Right,Posterior Calf Assessment: Performed By: Physician Ricard Dillon., MD Debridement Type: Debridement Severity of Tissue Pre Fat layer exposed Debridement: Level of Consciousness (Pre- Awake and Alert procedure): Pre-procedure Yes - 09:19 Verification/Time Out Taken: Start Time: 09:19 Pain Control: Other : benzocaine 20% Total Area Debrided (L x W): 1.6 (cm) x 1.1 (cm) = 1.76 (cm) Tissue and other material Viable, Non-Viable, Slough, Subcutaneous, Slough debrided: Level: Skin/Subcutaneous Tissue Debridement Description: Excisional Instrument: Curette Bleeding: Minimum Hemostasis Achieved: Pressure End Time: 09:27 Procedural Pain: 0 Post Procedural Pain: 0 Response to Treatment: Procedure was tolerated well Level of Consciousness Awake and Alert (Post-procedure): Post Debridement  Measurements of Total Wound Length: (cm) 1.6 Width: (cm) 1.1 Depth: (cm) 0.1 Volume: (cm) 0.138 Character of Wound/Ulcer Post Improved Debridement: Severity of Tissue Post Debridement: Fat layer exposed Post Procedure Diagnosis Same as Pre-procedure Electronic Signature(s) Signed: 09/26/2019 6:15:30 PM By: [name] MD Entered By: [name] on 09/26/2019 09:54:37 -------------------------------------------------------------------------------- HPI Details Patient Name: Date of Service: Daniel Pia D. 09/26/2019 8:15 AM Medical Record [id number] Patient Account Number: [id number] Date of Birth/Sex: Treating RN: September 18, 1941 (78 y.o. M) Primary Care Provider: Hurshel Party Other Clinician: Referring Provider: Treating Provider/Extender:[name], Cheryl Flash, [place] Weeks in Treatment: 30 History of Present Illness Location: Patient presents with a wound to left lower leg. Quality: Patient reports No Pain. Duration: 2 months HPI Description: no cig or alcohol. spontaneous appearance in area of stasis dermamtitis.  [name] on metformin only. chronic afib on Coumadin. diabetes and coag studies not good. hba1c 7.5. ivr 4.5. no pain or sxs of systemic disease. hx chf. no intermittent claudication 02/28/2019 Readmission This is a now a 78 year old man who was previously cared for in 2016 by Dr. Lindon Romp for wounds on his lower extremities. At that point he had venous reflux studies although I cannot seem to open these in [place] link. He had arterial studies showing an ABI of 1.11 on the right and 1.27 on the left his waveforms were triphasic bilaterally. He was discharged in stockings although I do not believe he is wearing these in some time. He tells me that about a month ago he noted openings of a large wound on the posterior right calf and 2 smaller areas on the left lateral calf and a small area more recently on the left posterior calf. He has been dressing these with peroxide and triple antibiotic ointment. He is not wearing compression. Past medical history; type 2 diabetes with peripheral neuropathy, chronic venous insufficiency, hypertension, cardiomyopathy, chronic atrial fibrillation on Coumadin, prostate cancer, hyperlipidemia, gout, ABI in our clinic was 1.34 on the left and not obtainable on the right 6/9; this is a patient who has chronic venous insufficiency. He has a fairly substantial area on the right posterior calf, left lateral calf and a small area on the left posterior calf. On arrival last week he had very palpable popliteal and femoral pulses but nothing in his bilateral feet. Unfortunately we cannot get arterial studies until July 1 at Dr. Kennon Holter office. They live in [place]. We use silver alginate under Kerlix Coban 6/16; patient with chronic venous insufficiency with wounds on his bilateral lower extremities. When he came into our clinic he was discovered to have a complete absence of peripheral pedal pulses at either the dorsalis pedis or posterior tibial. He does have easily  palpable femoral and popliteal pulses. He sees Dr. [name] tomorrow for noninvasive arterial tests. He may also require venous reflux evaluation although I do not view this as an urgent thing. We have been using silver alginate. His wound surfaces of cleaned up quite nicely 6/23; patient with chronic venous insufficiency with wounds on his bilateral lower extremities. His wounds all are somewhat better looking. He did go to Dr. Kennon Holter office but somehow ended up on the doctors schedule rather than being scheduled for noninvasive tests therefore his noninvasive tests are scheduled for July 1. We agree that he has venous insufficiency ulcers but I cannot feel any pulses in his lower extremities dictating the need for test. We are only using Kerlix and light Coban unfortunately this appears to be holding the edema 6/30; has  his arterial studies tomorrow. We have been using Kerlix and light Coban will go to a more aggressive compression if the arterial studies will allow. We all agreed these are venous wounds however I cannot feel pulses at either the dorsalis pedis or posterior tibial bilaterally. His wounds generally look some better including left lateral and right posterior. 7/7-Patient returns at 1 week in Kerlix/Coban to both legs, with improvement, in the left lateral and right posterior lower leg wounds, ABI's are normal in both legs per vascular studies, TBI is also normal on both sides, we are using hydrofera blue to the wounds 7/14; patient's arterial studies from 2 weeks ago showed an ABI on the right at 1.03 with a TBI of 0.86. On the left the ABI was 1.06 with a TBI of 0.84. Notable for the fact that his arterial waveforms were monophasic in all of the lower extremity arteries suggesting some degree of arterial occlusive disease but in general this was felt to be fairly adequate for healing. His compression was increased from 2-3 layer which is appropriate. Dressing was changed  to [facility] 7/21; patient's wounds are measuring smaller. The more substantial one on the right posterior calf, second 1 on the left lateral calf. Using [facility] on both wound areas 7/28; patient continues to make nice improvements. The area on the right posterior calf is smaller. Area on the left lateral calf also is smaller. We have been using Hydrofera Blue under compression. The patient will need compression stockings and we have measured him for these in the eventuality that these heal which really should not be too long from now 8/4-Patient continues to make improvement, the right posterior calf area smaller with rim of keratotic skin on one side, the left wound is definitely smaller and improving. 8/11-Returns at 1 week, after being in 3 layer compression on both legs, both wounds appear to be improving, making good progress, patient is happy, pain is also less especially in the right leg wound 8/18; the area on the left anterior lower leg is healed. On the right posterior leg the wound remains although the dimensions are a lot better. 8/25; he arrives in clinic today with a large body of open wound on the left lateral calf. All of the 3 wounds in this area are in close juxtaposition to each other. The story is that we discharged him last week with no a wrap on the left leg. They went to [place] could not get in as they are only excepting phone orders or online orders for stockings hence they did not put any stocking on the left leg all week. They have something at home but the patient with that was either incapable or just did not put them on. Apparently these opened 1 morning after getting out of bed. The area on the right has no real change 9/1; patient has bilateral lower extremity wounds in the setting of severe chronic venous insufficiency and secondary lymphedema. He arrived last week with new areas on the left lateral lower leg after we did not wrap him and he  did not use his stockings. Nevertheless the areas on the left look better today under compression. Posterior right calf does not really changed. We are using Hydrofera Blue on both areas under compression 9/15; bilateral lower extremity wounds in the setting of severe chronic venous insufficiency and secondary lymphedema. He has 20 to 30 mmHg below-knee compression stockings under the eventuality that these close over. We did get the left  leg to close but he did not transition to a stocking and this reopened. There are 2 open areas on the left posterior lateral calf and one on the right. Both of these look satisfactory. Using [facility] 9/22; bilateral lower extremity wounds in the setting of chronic venous insufficiency. 2 superficial areas on the left lateral calf. One on the right just above the Achilles area. We have good edema control we have been using Hydrofera Blue 9/29; the areas on the left lateral calf are healed. On the right just above the Achilles and tendon area things look a lot better small wounds one scabbed area. We have been using Hydrofera Blue. We can discharge him in his own stocking on the left still wrapping on the right. This is the second time we have healed the left leg but he did not put a stocking on last time. Hopefully this will maintain the edema from chronic venous disease with secondary lymphedema 10/6; he comes in today having a stocking on the left leg. They had trouble getting it on there is a lot of increase in swelling 2 small open areas one anteriorly and one on the medial calf. They report a lot of difficulty getting the stocking on. Paradoxically the area on the right that we have been wrapping posteriorly is closed 10/13; he comes in today with wounds bilaterally including superficial areas on the left medial and left lateral calf. As well as the right posterior has reopened in the Achilles area superiorly. He still does not have his juxta  lite stockings although truthfully we would not of been able to use them today anyway. Apparently have been ordered and paid for from [employer] although they have not been delivered 10/20; his area on the right is just the boat closed on the right posterior. Still has the area on the left lateral and a very tiny area on the left medial. He has his bilateral juxta lites although he is not ready for them this week. He tolerated the increase to 4 layer compression last week quite well 10/27; the area on the right posterior calf is once again closed. He has a superficial area on the left lateral calf that is still open. He has been using Hydrofera Blue and bilateral 4-layer compression. He can change to his own juxta lite stocking on the right and we are instructing him today 11/3; the area on the right posterior calf reopened according to the patient and his wife after they took off the stocking when they got home last week. Apparently scabbed over there is now a fairly substantial wound which looks pretty much the same. Our intake nurse noted that they were using the juxta lite stockings appropriately. I was really hoping I might be able to close him out today. He has 1 very tiny remaining area on the left lateral lower leg. 11/10; right posterior calf wound measures smaller but is still open. We have been using Hydrofera Blue. On the left he has a small oval-shaped wound and he seems to have had another wound distally that is open and likely a blister. We are using Hydrofera Blue under compression 11/17; right posterior calf wound continues to get better. We have been using Hydrofera Blue. On the left lateral one of the wounds has closed still a small open area. We have been using Hydrofera Blue on this as well. Both areas have been under 4-layer compression Arrives in clinic today with some swelling in the dorsal foot on  the right some erythema of his forefoot and toes. Initially when I looked at  this I almost thought this was a sunburn distal to a wrap injury. 12/1; right posterior calf wound debrided with a curette. We have been using Hydrofera Blue on the left anterior lateral he has an area across the mid tibia. Finally a small area on the left lateral lower calf. Finally he continues to have de-epithelialized areas on the dorsal aspect of his toes. Initially thought this might be a burn injury when I saw him 2 weeks ago. I now wonder about tinea. I have also reviewed his arterial studies which were really quite good in July/20 with normal TBI's and ABIs but monophasic waveforms 12/8; comes in today with worsening problems especially on the left leg where he now has a cluster of wounds in the left anterior mid tibia. Very poor edema control. I reduced him to 3 layer from 4 layer compression last week because of some concern about blood flow to his toes however he does not have good edema control on the left leg. Right leg edema control looks satisfactory. On the left he has a cluster of wounds anteriorly, small area on the left medial fifth met head and then the collection of areas on his toes which appear better On the right he has the original area on the right posterior calf, a new area right medially. His formal arterial studies from mid July are noted below. He was evaluated by Dr.Berry ABI Findings: +---------+------------------+-----+----------+--------+ Right Rt Pressure (mmHg)IndexWaveform Comment  +---------+------------------+-----+----------+--------+ Brachial 176     +---------+------------------+-----+----------+--------+ ATA 176 0.99 monophasic  +---------+------------------+-----+----------+--------+ PTA 183 1.03 monophasic  +---------+------------------+-----+----------+--------+ PERO 172 0.97 monophasic  +---------+------------------+-----+----------+--------+ Great Toe153 0.86     +---------+------------------+-----+----------+--------+ +---------+------------------+-----+-----------+-------+ Left Lt Pressure (mmHg)IndexWaveform Comment +---------+------------------+-----+-----------+-------+ Brachial 178     +---------+------------------+-----+-----------+-------+ ATA 162 0.91 multiphasic  +---------+------------------+-----+-----------+-------+ PTA 188 1.06 multiphasic  +---------+------------------+-----+-----------+-------+ PERO 158 0.89 monophasic   +---------+------------------+-----+-----------+-------+ Great Toe150 0.84    +---------+------------------+-----+-----------+-------+ +-------+-----------+-----------+------------+------------+ ABI/TBIToday's ABIToday's TBIPrevious ABIPrevious TBI +-------+-----------+-----------+------------+------------+ Right 1.03 0.86 1.11   +-------+-----------+-----------+------------+------------+ Left 1.06 0.84 1.27   +-------+-----------+-----------+------------+------------+ Tibial waveforms somewhat difficult to record due to irregular heartbeat. Bilateral ABIs appear essentially unchanged compared to prior study on 06/21/15. Summary: Right: Resting right ankle-brachial index is within normal range. No evidence of significant right lower extremity arterial disease. The right toe-brachial index is normal. Although ankle brachial indices are within normal limits (0.95-1.29), arterial Doppler waveforms at the ankle suggest some component of arterial occlusive disease. Left: Resting left ankle-brachial index is within normal range. No evidence of significant left lower extremity arterial disease. The left toe-brachial index is normal. Although ankle brachial indices are within normal limits (0.95-1.29), arterial Doppler waveforms at the ankle suggest some component of arterial occlusive disease. 12/15; the patient's area on the left mid tibia looks better. Right  posterior calf also better. He has the area on the left foot as well. All of his toes look better I think this was tinea. We are using Hydrofera Blue everywhere else The patient was in urgent care yesterday with wheezing and shortness of breath. He got azithromycin and prednisone. He feels better. His lungs are currently clear to auscultation. He was not tested for Covid 19 12/29; the patient arrives in clinic today with quite a bit change. 2 weeks ago he only had areas on the left mid tibia right posterior calf with tinea pedis resolving between his toes. He arrives in clinic today with several areas on the dorsal toes  on the right, dorsal left second toe. He has skin breakdown in the left medial calf probably from excess edema. Small area proximally in the medial calf. He has weeping edema fluid coming out of the skin excoriations on the left medial calf. He tells me that he is having a cardiac catheterization next week. I had a quick look at [facility] health link. He was found to have an ejection fraction of 25% during the work-up for persistent atrial fibrillation. He saw his cardiology office yesterday seen by the nurse practitioner. She increased his carvedilol. He has not been on diuretics for apparently several months and indeed in the nurse practitioner Dietrich Pates notes she had knowledge of this. Electronic Signature(s) Signed: 09/26/2019 6:15:30 PM By: [name] MD Entered By: [name] on 09/26/2019 09:57:37 -------------------------------------------------------------------------------- Physical Exam Details Patient Name: Date of Service: Daniel, Gross 09/26/2019 8:15 AM Medical Record [id number] Patient Account Number: [id number] Date of Birth/Sex: Treating RN: Nov 12, 1940 (78 y.o. M) Primary Care Provider: Hurshel Party Other Clinician: Referring Provider: Treating Provider/Extender:Karthikeya Funke, Cheryl Flash, [place] Weeks in Treatment:  30 Constitutional Sitting or standing Blood Pressure is within target range for patient.. Atrial fibrillation. Somewhat tachypneic. Temperature is normal and within the target range for the patient.. Looks a little shortness of breath. Eyes Conjunctivae clear. No discharge.no icterus. Respiratory work of breathing looks a little excessive to me.. Surprisingly his lungs are clear. Cardiovascular Atrial fibrillation. I could not hear regurgitant murmurs of MR or TR no S3 his jugular venous pressure was not elevated. 3+ coccyx edema. His pedal pulses are difficult to feel. Changes of chronic venous insufficiency. 3+ pitting edema of the left calf extending into the upper left thigh. No evidence of a DVT. Lesser degrees of edema on the right.. Lymphatic None palpable in the popliteal or inguinal area. Psychiatric appears at normal baseline. Notes Wound exam On the right he has the area on the right posterior calf. Completely nonviable surface I used a #3 curette to debride this. He has excoriations over the dorsal first second and third toes no doubt due to foot wear trauma. I removed the necrotic debris over the surface of this also with a #3 curette Findings were more worrisome on the left. He has weeping edema fluid coming out of some excoriations on the left medial calf. I think this is probably edema fluid excessive swelling. The area on the left anterior tibia has a necrotic surface also requiring debridement. He has a large area on the dorsal aspect PIP of the left second toe. Electronic Signature(s) Signed: 09/26/2019 6:15:30 PM By: [name] MD Entered By: [name] on 09/26/2019 10:01:12 -------------------------------------------------------------------------------- Physician Orders Details Patient Name: Date of Service: Daniel, Gross 09/26/2019 8:15 AM Medical Record [id number] Patient Account Number: [id number] Date of Birth/Sex: Treating  RN: May 23, 1941 (78 y.o. [name]) Dolores Lory, [place] Hummingbird Primary Care Provider: Hurshel Party Other Clinician: Referring Provider: Treating Provider/Extender:Massimo Hartland, Cheryl Flash, [place] Weeks in Treatment: 30 Verbal / Phone Orders: No Diagnosis Coding ICD-10 Coding Code Description E11.51 Type 2 diabetes mellitus with diabetic peripheral angiopathy without gangrene L97.211 Non-pressure chronic ulcer of right calf limited to breakdown of skin E11.42 Type 2 diabetes mellitus with diabetic polyneuropathy L97.221 Non-pressure chronic ulcer of left calf limited to breakdown of skin I87.333 Chronic venous hypertension (idiopathic) with ulcer and inflammation of bilateral lower extremity B35.3 Tinea pedis Follow-up Appointments Return Appointment in 1 week. Dressing Change Frequency Wound #13 Left Toe Second Change dressing every day. Wound #14 Left Toe Third Change  dressing every day. Wound #15 Left,Anterior Lower Leg Do not change entire dressing for one week. Wound #16 Left,Distal,Anterior Lower Leg Do not change entire dressing for one week. Wound #17 Right,Medial Lower Leg Do not change entire dressing for one week. Wound #18 Left,Medial Foot Do not change entire dressing for one week. Wound #3R Right,Posterior Calf Do not change entire dressing for one week. Skin Barriers/Peri-Wound Care Wound #18 Left,Medial Foot TCA Cream or Ointment Wound Cleansing May shower with protection. Primary Wound Dressing Wound #13 Left Toe Second Other: - ketoconasole Wound #14 Left Toe Third Other: - ketoconasole Wound #15 Left,Anterior Lower Leg Calcium Alginate with Silver Wound #16 Left,Distal,Anterior Lower Leg Iodoflex Wound #17 Right,Medial Lower Leg Hydrofera Blue Wound #18 Left,Medial Foot Hydrofera Blue Wound #19 Left,Distal,Medial Lower Leg Hydrofera Blue Wound #20 Right Toe Great Hydrofera Blue Wound #21 Right Toe Second Hydrofera Blue Wound #22 Right Toe Third Hydrofera  Blue Wound #23 Left,Proximal,Medial Lower Leg Hydrofera Blue Wound #3R Right,Posterior Calf Iodoflex Secondary Dressing Wound #13 Left Toe Second Dry Gauze Wound #14 Left Toe Third Dry Gauze Wound #15 Left,Anterior Lower Leg Dry Gauze ABD pad Wound #16 Left,Distal,Anterior Lower Leg Dry Gauze ABD pad Wound #17 Right,Medial Lower Leg Dry Gauze ABD pad Wound #18 Left,Medial Foot Dry Gauze Wound #20 Right Toe Great Dry Gauze Wound #21 Right Toe Second Dry Gauze Wound #22 Right Toe Third Dry Gauze Wound #3R Right,Posterior Calf Dry Gauze ABD pad Edema Control Wound #15 Left,Anterior Lower Leg Unna Boots Bilaterally - with kerlix coban overlay Wound #16 Left,Distal,Anterior Lower Leg Unna Boots Bilaterally - with kerlix coban overlay Wound #17 Right,Medial Lower Leg Unna Boots Bilaterally - with kerlix coban overlay Wound #3R Right,Posterior Calf Unna Boots Bilaterally - with kerlix coban overlay Off-Loading Open toe surgical shoe to: - left and right foot Electronic Signature(s) Signed: 09/26/2019 5:43:27 PM By: [name] RN Signed: 09/26/2019 6:15:30 PM By: [name] MD Entered By: [name] on 09/26/2019 09:54:31 -------------------------------------------------------------------------------- Problem List Details Patient Name: Date of Service: Daniel Pia D. 09/26/2019 8:15 AM Medical Record [id number] Patient Account Number: [id number] Date of Birth/Sex: Treating RN: 01/14/1941 (78 y.o. [name]) Dolores Lory, [place] Primary Care Provider: Hurshel Party Other Clinician: Referring Provider: Treating Provider/Extender:Camiah Humm, Cheryl Flash, [place] Weeks in Treatment: 30 Active Problems ICD-10 Evaluated Encounter Code Description Active Date Today Diagnosis E11.51 Type 2 diabetes mellitus with diabetic peripheral 02/28/2019 No Yes angiopathy without gangrene L97.211 Non-pressure chronic ulcer of right calf limited to 02/28/2019 No Yes breakdown of  skin E11.42 Type 2 diabetes mellitus with diabetic polyneuropathy 02/28/2019 No Yes L97.221 Non-pressure chronic ulcer of left calf limited to 02/28/2019 No Yes breakdown of skin I87.333 Chronic venous hypertension (idiopathic) with ulcer 02/28/2019 No Yes and inflammation of bilateral lower extremity B35.3 Tinea pedis 09/05/2019 No Yes L97.521 Non-pressure chronic ulcer of other part of left foot 09/26/2019 No Yes limited to breakdown of skin L97.511 Non-pressure chronic ulcer of other part of right foot 09/26/2019 No Yes limited to breakdown of skin Inactive Problems Resolved Problems Electronic Signature(s) Signed: 09/26/2019 6:15:30 PM By: [name] MD Entered By: [name] on 09/26/2019 09:53:20 -------------------------------------------------------------------------------- Progress Note Details Patient Name: Date of Service: Daniel Pia D. 09/26/2019 8:15 AM Medical Record [id number] Patient Account Number: [id number] Date of Birth/Sex: Treating RN: Jan 01, 1941 (78 y.o. M) Primary Care Provider: Hurshel Party Other Clinician: Referring Provider: Treating Provider/Extender:Starlee Corralejo, Cheryl Flash, [place] Weeks in Treatment: 30 Subjective History of Present Illness (HPI) The following HPI elements were documented for the patient's wound:  Location: Patient presents with a wound to left lower leg. Quality: Patient reports No Pain. Duration: 2 months no cig or alcohol. spontaneous appearance in area of stasis dermamtitis. [name] on metformin only. chronic afib on Coumadin. diabetes and coag studies not good. hba1c 7.5. ivr 4.5. no pain or sxs of systemic disease. hx chf. no intermittent claudication 02/28/2019 Readmission This is a now a 78 year old man who was previously cared for in 2016 by Dr. Lindon Romp for wounds on his lower extremities. At that point he had venous reflux studies although I cannot seem to open these in [place] link. He had arterial studies  showing an ABI of 1.11 on the right and 1.27 on the left his waveforms were triphasic bilaterally. He was discharged in stockings although I do not believe he is wearing these in some time. He tells me that about a month ago he noted openings of a large wound on the posterior right calf and 2 smaller areas on the left lateral calf and a small area more recently on the left posterior calf. He has been dressing these with peroxide and triple antibiotic ointment. He is not wearing compression. Past medical history; type 2 diabetes with peripheral neuropathy, chronic venous insufficiency, hypertension, cardiomyopathy, chronic atrial fibrillation on Coumadin, prostate cancer, hyperlipidemia, gout, ABI in our clinic was 1.34 on the left and not obtainable on the right 6/9; this is a patient who has chronic venous insufficiency. He has a fairly substantial area on the right posterior calf, left lateral calf and a small area on the left posterior calf. On arrival last week he had very palpable popliteal and femoral pulses but nothing in his bilateral feet. Unfortunately we cannot get arterial studies until July 1 at Dr. Kennon Holter office. They live in [place]. We use silver alginate under Kerlix Coban 6/16; patient with chronic venous insufficiency with wounds on his bilateral lower extremities. When he came into our clinic he was discovered to have a complete absence of peripheral pedal pulses at either the dorsalis pedis or posterior tibial. He does have easily palpable femoral and popliteal pulses. He sees Dr. [name] tomorrow for noninvasive arterial tests. He may also require venous reflux evaluation although I do not view this as an urgent thing. We have been using silver alginate. His wound surfaces of cleaned up quite nicely 6/23; patient with chronic venous insufficiency with wounds on his bilateral lower extremities. His wounds all are somewhat better looking. He did go to Dr. Kennon Holter office but  somehow ended up on the doctors schedule rather than being scheduled for noninvasive tests therefore his noninvasive tests are scheduled for July 1. We agree that he has venous insufficiency ulcers but I cannot feel any pulses in his lower extremities dictating the need for test. We are only using Kerlix and light Coban unfortunately this appears to be holding the edema 6/30; has his arterial studies tomorrow. We have been using Kerlix and light Coban will go to a more aggressive compression if the arterial studies will allow. We all agreed these are venous wounds however I cannot feel pulses at either the dorsalis pedis or posterior tibial bilaterally. His wounds generally look some better including left lateral and right posterior. 7/7-Patient returns at 1 week in Kerlix/Coban to both legs, with improvement, in the left lateral and right posterior lower leg wounds, ABI's are normal in both legs per vascular studies, TBI is also normal on both sides, we are using hydrofera blue to the wounds 7/14; patient's arterial  studies from 2 weeks ago showed an ABI on the right at 1.03 with a TBI of 0.86. On the left the ABI was 1.06 with a TBI of 0.84. Notable for the fact that his arterial waveforms were monophasic in all of the lower extremity arteries suggesting some degree of arterial occlusive disease but in general this was felt to be fairly adequate for healing. His compression was increased from 2-3 layer which is appropriate. Dressing was changed to [facility] 7/21; patient's wounds are measuring smaller. The more substantial one on the right posterior calf, second 1 on the left lateral calf. Using [facility] on both wound areas 7/28; patient continues to make nice improvements. The area on the right posterior calf is smaller. Area on the left lateral calf also is smaller. We have been using Hydrofera Blue under compression. The patient will need compression stockings and we have measured  him for these in the eventuality that these heal which really should not be too long from now 8/4-Patient continues to make improvement, the right posterior calf area smaller with rim of keratotic skin on one side, the left wound is definitely smaller and improving. 8/11-Returns at 1 week, after being in 3 layer compression on both legs, both wounds appear to be improving, making good progress, patient is happy, pain is also less especially in the right leg wound 8/18; the area on the left anterior lower leg is healed. On the right posterior leg the wound remains although the dimensions are a lot better. 8/25; he arrives in clinic today with a large body of open wound on the left lateral calf. All of the 3 wounds in this area are in close juxtaposition to each other. The story is that we discharged him last week with no a wrap on the left leg. They went to [place] could not get in as they are only excepting phone orders or online orders for stockings hence they did not put any stocking on the left leg all week. They have something at home but the patient with that was either incapable or just did not put them on. Apparently these opened 1 morning after getting out of bed. The area on the right has no real change 9/1; patient has bilateral lower extremity wounds in the setting of severe chronic venous insufficiency and secondary lymphedema. He arrived last week with new areas on the left lateral lower leg after we did not wrap him and he did not use his stockings. Nevertheless the areas on the left look better today under compression. Posterior right calf does not really changed. We are using Hydrofera Blue on both areas under compression 9/15; bilateral lower extremity wounds in the setting of severe chronic venous insufficiency and secondary lymphedema. He has 20 to 30 mmHg below-knee compression stockings under the eventuality that these close over. We did get the left leg to close but he did  not transition to a stocking and this reopened. There are 2 open areas on the left posterior lateral calf and one on the right. Both of these look satisfactory. Using [facility] 9/22; bilateral lower extremity wounds in the setting of chronic venous insufficiency. 2 superficial areas on the left lateral calf. One on the right just above the Achilles area. We have good edema control we have been using Hydrofera Blue 9/29; the areas on the left lateral calf are healed. On the right just above the Achilles and tendon area things look a lot better small wounds one scabbed  area. We have been using Hydrofera Blue. We can discharge him in his own stocking on the left still wrapping on the right. This is the second time we have healed the left leg but he did not put a stocking on last time. Hopefully this will maintain the edema from chronic venous disease with secondary lymphedema 10/6; he comes in today having a stocking on the left leg. They had trouble getting it on there is a lot of increase in swelling 2 small open areas one anteriorly and one on the medial calf. They report a lot of difficulty getting the stocking on. ooParadoxically the area on the right that we have been wrapping posteriorly is closed 10/13; he comes in today with wounds bilaterally including superficial areas on the left medial and left lateral calf. As well as the right posterior has reopened in the Achilles area superiorly. He still does not have his juxta lite stockings although truthfully we would not of been able to use them today anyway. Apparently have been ordered and paid for from [employer] although they have not been delivered 10/20; his area on the right is just the boat closed on the right posterior. Still has the area on the left lateral and a very tiny area on the left medial. He has his bilateral juxta lites although he is not ready for them this week. He tolerated the increase to 4 layer compression last week  quite well 10/27; the area on the right posterior calf is once again closed. He has a superficial area on the left lateral calf that is still open. He has been using Hydrofera Blue and bilateral 4-layer compression. He can change to his own juxta lite stocking on the right and we are instructing him today 11/3; the area on the right posterior calf reopened according to the patient and his wife after they took off the stocking when they got home last week. Apparently scabbed over there is now a fairly substantial wound which looks pretty much the same. Our intake nurse noted that they were using the juxta lite stockings appropriately. I was really hoping I might be able to close him out today. He has 1 very tiny remaining area on the left lateral lower leg. 11/10; right posterior calf wound measures smaller but is still open. We have been using Hydrofera Blue. On the left he has a small oval-shaped wound and he seems to have had another wound distally that is open and likely a blister. We are using Hydrofera Blue under compression 11/17; right posterior calf wound continues to get better. We have been using Hydrofera Blue. On the left lateral one of the wounds has closed still a small open area. We have been using Hydrofera Blue on this as well. Both areas have been under 4-layer compression Arrives in clinic today with some swelling in the dorsal foot on the right some erythema of his forefoot and toes. Initially when I looked at this I almost thought this was a sunburn distal to a wrap injury. 12/1; right posterior calf wound debrided with a curette. We have been using Hydrofera Blue on the left anterior lateral he has an area across the mid tibia. Finally a small area on the left lateral lower calf. Finally he continues to have de-epithelialized areas on the dorsal aspect of his toes. Initially thought this might be a burn injury when I saw him 2 weeks ago. I now wonder about tinea. I have also  reviewed his arterial studies  which were really quite good in July/20 with normal TBI's and ABIs but monophasic waveforms 12/8; comes in today with worsening problems especially on the left leg where he now has a cluster of wounds in the left anterior mid tibia. Very poor edema control. I reduced him to 3 layer from 4 layer compression last week because of some concern about blood flow to his toes however he does not have good edema control on the left leg. Right leg edema control looks satisfactory. ooOn the left he has a cluster of wounds anteriorly, small area on the left medial fifth met head and then the collection of areas on his toes which appear better ooOn the right he has the original area on the right posterior calf, a new area right medially. His formal arterial studies from mid July are noted below. He was evaluated by Dr.Berry ABI Findings: +---------+------------------+-----+----------+--------+ Right Rt Pressure (mmHg)IndexWaveform Comment  +---------+------------------+-----+----------+--------+ Brachial 176     +---------+------------------+-----+----------+--------+ ATA 176 0.99 monophasic  +---------+------------------+-----+----------+--------+ PTA 183 1.03 monophasic  +---------+------------------+-----+----------+--------+ PERO 172 0.97 monophasic  +---------+------------------+-----+----------+--------+ Great Toe153 0.86    +---------+------------------+-----+----------+--------+ +---------+------------------+-----+-----------+-------+ Left Lt Pressure (mmHg)IndexWaveform Comment +---------+------------------+-----+-----------+-------+ Brachial 178     +---------+------------------+-----+-----------+-------+ ATA 162 0.91 multiphasic  +---------+------------------+-----+-----------+-------+ PTA 188 1.06 multiphasic  +---------+------------------+-----+-----------+-------+ PERO 158 0.89 monophasic    +---------+------------------+-----+-----------+-------+ Great Toe150 0.84    +---------+------------------+-----+-----------+-------+ +-------+-----------+-----------+------------+------------+ ABI/TBIToday's ABIToday's TBIPrevious ABIPrevious TBI +-------+-----------+-----------+------------+------------+ Right 1.03 0.86 1.11   +-------+-----------+-----------+------------+------------+ Left 1.06 0.84 1.27   +-------+-----------+-----------+------------+------------+ Tibial waveforms somewhat difficult to record due to irregular heartbeat. Bilateral ABIs appear essentially unchanged compared to prior study on 06/21/15. Summary: Right: Resting right ankle-brachial index is within normal range. No evidence of significant right lower extremity arterial disease. The right toe-brachial index is normal. Although ankle brachial indices are within normal limits (0.95-1.29), arterial Doppler waveforms at the ankle suggest some component of arterial occlusive disease. Left: Resting left ankle-brachial index is within normal range. No evidence of significant left lower extremity arterial disease. The left toe-brachial index is normal. Although ankle brachial indices are within normal limits (0.95-1.29), arterial Doppler waveforms at the ankle suggest some component of arterial occlusive disease. 12/15; the patient's area on the left mid tibia looks better. Right posterior calf also better. He has the area on the left foot as well. All of his toes look better I think this was tinea. We are using Hydrofera Blue everywhere else The patient was in urgent care yesterday with wheezing and shortness of breath. He got azithromycin and prednisone. He feels better. His lungs are currently clear to auscultation. He was not tested for Covid 19 12/29; the patient arrives in clinic today with quite a bit change. 2 weeks ago he only had areas on the left mid tibia right posterior calf  with tinea pedis resolving between his toes. He arrives in clinic today with several areas on the dorsal toes on the right, dorsal left second toe. He has skin breakdown in the left medial calf probably from excess edema. Small area proximally in the medial calf. He has weeping edema fluid coming out of the skin excoriations on the left medial calf. He tells me that he is having a cardiac catheterization next week. I had a quick [facility] at [facility] health link. He was found to have an ejection fraction of 25% during the work-up for persistent atrial fibrillation. He saw his cardiology office yesterday seen by the nurse practitioner. She increased his carvedilol. He has not been on diuretics for apparently several months and  indeed in the nurse practitioner Dietrich Pates notes she had knowledge of this. Objective Constitutional Sitting or standing Blood Pressure is within target range for patient.. Atrial fibrillation. Somewhat tachypneic. Temperature is normal and within the target range for the patient.. Looks a little shortness of breath. Vitals Time Taken: 8:27 AM, Height: 74 in, Weight: 212 lbs, BMI: 27.2, Temperature: 98 F, Pulse: 93 bpm, Respiratory Rate: 22 breaths/min, Blood Pressure: 129/68 mmHg. Eyes Conjunctivae clear. No discharge.no icterus. Respiratory work of breathing looks a little excessive to me.. Surprisingly his lungs are clear. Cardiovascular Atrial fibrillation. I could not hear regurgitant murmurs of MR or TR no S3 his jugular venous pressure was not elevated. 3+ coccyx edema. His pedal pulses are difficult to feel. Changes of chronic venous insufficiency. 3+ pitting edema of the left calf extending into the upper left thigh. No evidence of a DVT. Lesser degrees of edema on the right.. Lymphatic None palpable in the popliteal or inguinal area. Psychiatric appears at normal baseline. General Notes: Wound exam ooOn the right he has the area on the right posterior calf.  Completely nonviable surface I used a #3 curette to debride this. He has excoriations over the dorsal first second and third toes no doubt due to foot wear trauma. I removed the necrotic debris over the surface of this also with a #3 curette ooFindings were more worrisome on the left. He has weeping edema fluid coming out of some excoriations on the left medial calf. I think this is probably edema fluid excessive swelling. The area on the left anterior tibia has a necrotic surface also requiring debridement. He has a large area on the dorsal aspect PIP of the left second toe. Integumentary (Hair, Skin) Wound #13 status is Open. Original cause of wound was Gradually Appeared. The wound is located on the Left Toe Second. The wound measures 1.3cm length x 0.9cm width x 0.1cm depth; 0.919cm^2 area and 0.092cm^3 volume. There is Fat Layer (Subcutaneous Tissue) Exposed exposed. There is no tunneling or undermining noted. There is a small amount of serous drainage noted. The wound margin is distinct with the outline attached to the wound base. There is medium (34-66%) pink granulation within the wound bed. There is a medium (34-66%) amount of necrotic tissue within the wound bed including Adherent Slough. Wound #14 status is Open. Original cause of wound was Gradually Appeared. The wound is located on the Left Toe Third. The wound measures 0cm length x 0cm width x 0cm depth; 0cm^2 area and 0cm^3 volume. There is no tunneling or undermining noted. There is a none present amount of drainage noted. The wound margin is distinct with the outline attached to the wound base. There is no granulation within the wound bed. There is no necrotic tissue within the wound bed. Wound #15 status is Open. Original cause of wound was Blister. The wound is located on the Left,Anterior Lower Leg. The wound measures 4.5cm length x 2.5cm width x 0.1cm depth; 8.836cm^2 area and 0.884cm^3 volume. There is Fat Layer  (Subcutaneous Tissue) Exposed exposed. There is no tunneling or undermining noted. There is a medium amount of serous drainage noted. The wound margin is distinct with the outline attached to the wound base. There is medium (34-66%) pink granulation within the wound bed. There is a medium (34-66%) amount of necrotic tissue within the wound bed including Adherent Slough. Wound #16 status is Open. Original cause of wound was Blister. The wound is located on the [facility] Lower Leg. The wound  measures 0.7cm length x 3.2cm width x 0.1cm depth; 1.759cm^2 area and 0.176cm^3 volume. There is Fat Layer (Subcutaneous Tissue) Exposed exposed. There is no tunneling or undermining noted. There is a medium amount of serous drainage noted. The wound margin is distinct with the outline attached to the wound base. There is medium (34-66%) pink granulation within the wound bed. There is a medium (34-66%) amount of necrotic tissue within the wound bed including Adherent Slough. Wound #17 status is Open. Original cause of wound was Blister. The wound is located on the Right,Medial Lower Leg. The wound measures 0cm length x 0cm width x 0cm depth; 0cm^2 area and 0cm^3 volume. There is no tunneling or undermining noted. There is a none present amount of drainage noted. The wound margin is distinct with the outline attached to the wound base. There is no granulation within the wound bed. There is no necrotic tissue within the wound bed. Wound #18 status is Open. Original cause of wound was Gradually Appeared. The wound is located on the Left,Medial Foot. The wound measures 0.6cm length x 0.3cm width x 0.1cm depth; 0.141cm^2 area and 0.014cm^3 volume. There is no tunneling or undermining noted. There is a none present amount of drainage noted. The wound margin is distinct with the outline attached to the wound base. There is no granulation within the wound bed. There is a large (67-100%) amount of necrotic  tissue within the wound bed including Adherent Slough. Wound #19 status is Open. Original cause of wound was Gradually Appeared. The wound is located on the Left,Distal,Medial Lower Leg. The wound measures 10.5cm length x 6.5cm width x 0.1cm depth; 53.603cm^2 area and 5.36cm^3 volume. There is Fat Layer (Subcutaneous Tissue) Exposed exposed. There is no tunneling or undermining noted. There is a medium amount of serous drainage noted. The wound margin is distinct with the outline attached to the wound base. There is large (67-100%) red, pink granulation within the wound bed. There is no necrotic tissue within the wound bed. Wound #20 status is Open. Original cause of wound was Gradually Appeared. The wound is located on the Right Toe Great. The wound measures 1.3cm length x 0.5cm width x 0.1cm depth; 0.511cm^2 area and 0.051cm^3 volume. There is no tunneling or undermining noted. There is a small amount of serous drainage noted. The wound margin is distinct with the outline attached to the wound base. There is no granulation within the wound bed. There is a large (67-100%) amount of necrotic tissue within the wound bed including Adherent Slough. Wound #21 status is Open. Original cause of wound was Gradually Appeared. The wound is located on the Right Toe Second. The wound measures 1.2cm length x 0.7cm width x 0.1cm depth; 0.66cm^2 area and 0.066cm^3 volume. There is no tunneling or undermining noted. There is a small amount of serous drainage noted. The wound margin is distinct with the outline attached to the wound base. There is no granulation within the wound bed. There is a large (67-100%) amount of necrotic tissue within the wound bed including Adherent Slough. Wound #22 status is Open. Original cause of wound was Gradually Appeared. The wound is located on the Right Toe Third. The wound measures 1.5cm length x 1cm width x 0.1cm depth; 1.178cm^2 area and 0.118cm^3 volume. There is Fat Layer  (Subcutaneous Tissue) Exposed exposed. There is no tunneling or undermining noted. There is a small amount of serous drainage noted. The wound margin is distinct with the outline attached to the wound base. There is  medium (34-66%) pink granulation within the wound bed. There is a medium (34-66%) amount of necrotic tissue within the wound bed including Adherent Slough. Wound #23 status is Open. Original cause of wound was Gradually Appeared. The wound is located on the Left,Proximal,Medial Lower Leg. The wound measures 0.8cm length x 1cm width x 0.1cm depth; 0.628cm^2 area and 0.063cm^3 volume. There is Fat Layer (Subcutaneous Tissue) Exposed exposed. There is no tunneling or undermining noted. There is a small amount of serous drainage noted. The wound margin is distinct with the outline attached to the wound base. There is medium (34-66%) pink granulation within the wound bed. There is a medium (34-66%) amount of necrotic tissue within the wound bed including Adherent Slough. Wound #3R status is Open. Original cause of wound was Gradually Appeared. The wound is located on the Right,Posterior Calf. The wound measures 1.6cm length x 1.1cm width x 0.1cm depth; 1.382cm^2 area and 0.138cm^3 volume. There is Fat Layer (Subcutaneous Tissue) Exposed exposed. There is no tunneling or undermining noted. There is a medium amount of serous drainage noted. The wound margin is distinct with the outline attached to the wound base. There is large (67-100%) pink granulation within the wound bed. There is a small (1-33%) amount of necrotic tissue within the wound bed including Adherent Slough. 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 There was a Excisional Skin/Subcutaneous Tissue Debridement with a total area of 1.17 sq cm performed by Ricard Dillon., MD. With the following instrument(s): Curette to remove Viable and Non-Viable tissue/material. Material removed includes Subcutaneous Tissue and Slough and after achieving pain control using Other (benzocaine 20%). No specimens were taken. A time out was conducted at 09:19, prior to the start of the procedure. A Minimum amount of bleeding was controlled with Pressure. The procedure was tolerated well with a pain level of 0 throughout and a pain level of 0 following the procedure. Post Debridement Measurements: 1.3cm length x 0.9cm width x 0.1cm depth; 0.092cm^3 volume. Character of Wound/Ulcer Post Debridement is improved. Severity of Tissue Post Debridement is: Fat layer exposed. Post procedure Diagnosis Wound #13: Same as Pre-Procedure Wound #16 Pre-procedure diagnosis of Wound #16 is a Venous Leg Ulcer located on the Left,Distal,Anterior Lower Leg .Severity of Tissue Pre Debridement is: Fat layer exposed. There was a Excisional Skin/Subcutaneous Tissue Debridement with a total area of 2.24 sq cm performed by Ricard Dillon., MD. With the following instrument(s): Curette to remove Viable and Non-Viable tissue/material. Material removed includes Subcutaneous Tissue and Slough and after achieving pain control using Other (benzocaine 20%). No specimens were taken. A time out was conducted at 09:19, prior to the start  of the procedure. A Minimum amount of bleeding was controlled with Pressure. The procedure was tolerated well with a pain level of 0 throughout and a pain level of 0 following the procedure. Post Debridement Measurements:  0.7cm length x 3.2cm width x 0.1cm depth; 0.176cm^3 volume. Character of Wound/Ulcer Post Debridement is improved. Severity of Tissue Post Debridement is: Fat layer exposed. Post procedure Diagnosis Wound #16: Same as Pre-Procedure Pre-procedure diagnosis of Wound #16 is a Venous Leg Ulcer located on the Left,Distal,Anterior Lower Leg . There was a Haematologist Compression Therapy Procedure by [name], RN. Post procedure Diagnosis Wound #16: Same as Pre-Procedure Wound #3R Pre-procedure diagnosis of Wound #3R is a Diabetic Wound/Ulcer of the Lower Extremity located on the Right,Posterior Calf .Severity of Tissue Pre Debridement is: Fat layer exposed. There was a Excisional Skin/Subcutaneous Tissue Debridement with a total area of 1.76 sq cm performed by Ricard Dillon., MD. With the following instrument(s): Curette to remove Viable and Non-Viable tissue/material. Material removed includes Subcutaneous Tissue and Slough and after achieving pain control using Other (benzocaine 20%). No specimens were taken. A time out was conducted at 09:19, prior to the start of the procedure. A Minimum amount of bleeding was controlled with Pressure. The procedure was tolerated well with a pain level of 0 throughout and a pain level of 0 following the procedure. Post Debridement Measurements: 1.6cm length x 1.1cm width x 0.1cm depth; 0.138cm^3 volume. Character of Wound/Ulcer Post Debridement is improved. Severity of Tissue Post Debridement is: Fat layer exposed. Post procedure Diagnosis Wound #3R: Same as Pre-Procedure Pre-procedure diagnosis of Wound #3R is a Diabetic Wound/Ulcer of the Lower Extremity located on the Right,Posterior Calf . There was a Three Layer Compression Therapy  Procedure by [name], RN. Post procedure Diagnosis Wound #3R: Same as Pre-Procedure Wound #15 Pre-procedure diagnosis of Wound #15 is a Diabetic Wound/Ulcer of the Lower Extremity located on the Left,Anterior Lower Leg . There was a Haematologist Compression Therapy Procedure by [name], RN. Post procedure Diagnosis Wound #15: Same as Pre-Procedure Wound #17 Pre-procedure diagnosis of Wound #17 is a Venous Leg Ulcer located on the Right,Medial Lower Leg . There was a Haematologist Compression Therapy Procedure by [name], RN. Post procedure Diagnosis Wound #17: Same as Pre-Procedure Wound #18 Pre-procedure diagnosis of Wound #18 is a Diabetic Wound/Ulcer of the Lower Extremity located on the Left,Medial Foot . There was a Haematologist Compression Therapy Procedure by [name], RN. Post procedure Diagnosis Wound #18: Same as Pre-Procedure Wound #19 Pre-procedure diagnosis of Wound #19 is a Venous Leg Ulcer located on the Left,Distal,Medial Lower Leg . There was a Three Layer Compression Therapy Procedure by [name], RN. Post procedure Diagnosis Wound #19: Same as Pre-Procedure Wound #23 Pre-procedure diagnosis of Wound #23 is a Venous Leg Ulcer located on the Left,Proximal,Medial Lower Leg . There was a Haematologist Compression Therapy Procedure by [name], RN. Post procedure Diagnosis Wound #23: Same as Pre-Procedure Plan Follow-up Appointments: Return Appointment in 1 week. Dressing Change Frequency: Wound #13 Left Toe Second: Change dressing every day. Wound #14 Left Toe Third: Change dressing every day. Wound #15 Left,Anterior Lower Leg: Do not change entire dressing for one week. Wound #16 Left,Distal,Anterior Lower Leg: Do not change entire dressing for one week. Wound #17 Right,Medial Lower Leg: Do not change entire dressing for one week. Wound #18 Left,Medial Foot: Do not change entire dressing for one week. Wound #3R Right,Posterior Calf: Do not change  entire dressing for one week. Skin Barriers/Peri-Wound Care: Wound #18 Left,Medial Foot: TCA Cream or Ointment Wound Cleansing: May shower with protection. Primary Wound Dressing: Wound #13 Left Toe Second: Other: - ketoconasole Wound #14 Left Toe Third: Other: -  ketoconasole Wound #15 Left,Anterior Lower Leg: Calcium Alginate with Silver Wound #16 Left,Distal,Anterior Lower Leg: Iodoflex Wound #17 Right,Medial Lower Leg: Hydrofera Blue Wound #18 Left,Medial Foot: Hydrofera Blue Wound #19 Left,Distal,Medial Lower Leg: Hydrofera Blue Wound #20 Right Toe Great: Hydrofera Blue Wound #21 Right Toe Second: Hydrofera Blue Wound #22 Right Toe Third: Hydrofera Blue Wound #23 Left,Proximal,Medial Lower Leg: Hydrofera Blue Wound #3R Right,Posterior Calf: Iodoflex Secondary Dressing: Wound #13 Left Toe Second: Dry Gauze Wound #14 Left Toe Third: Dry Gauze Wound #15 Left,Anterior Lower Leg: Dry Gauze ABD pad Wound #16 Left,Distal,Anterior Lower Leg: Dry Gauze ABD pad Wound #17 Right,Medial Lower Leg: Dry Gauze ABD pad Wound #18 Left,Medial Foot: Dry Gauze Wound #20 Right Toe Great: Dry Gauze Wound #21 Right Toe Second: Dry Gauze Wound #22 Right Toe Third: Dry Gauze Wound #3R Right,Posterior Calf: Dry Gauze ABD pad Edema Control: Wound #15 Left,Anterior Lower Leg: Unna Boots Bilaterally - with kerlix coban overlay Wound #16 Left,Distal,Anterior Lower Leg: Unna Boots Bilaterally - with kerlix coban overlay Wound #17 Right,Medial Lower Leg: Unna Boots Bilaterally - with kerlix coban overlay Wound #3R Right,Posterior Calf: Unna Boots Bilaterally - with kerlix coban overlay Off-Loading: Open toe surgical shoe to: - left and right foot 1. Fairly marked deterioration in several areas 2. Original wounds on the right posterior calf left anterior tibia required debridement. Changing dressing to Iodoflex 3. Large area of excoriation on the left lateral calf probably  because of excessive edema fluid silver alginate 4. He has excoriations over his dorsal toes 3 on the right and 1 on the left. All of these require debridement and will use silver alginate on theright to put him back in the healing sandals. The tinea is resolving 5. The patient does not have great vibrant pulses in his feet but he had a relatively normal arterial study in July of this year 6. Poorly controlled edema which I think is systemic from congestive failure. I put in a call to his cardiology office to see if we can get him started on some diuretic. 7. He is going for a heart cath next week I am not sure when we can get him back in here I have asked them to call our office Electronic Signature(s) Signed: 09/26/2019 6:15:30 PM By: [name] MD Entered By: [name] on 09/26/2019 10:06:29 -------------------------------------------------------------------------------- SuperBill Details Patient Name: Date of Service: [name] D. 09/26/2019 Medical Record [id number] Patient Account Number: [id number] Date of Birth/Sex: Treating RN: [birth year] (78 y.o. M) Primary Care Provider: Hurshel Party Other Clinician: Referring Provider: Treating Provider/Extender:Robson, Cheryl Flash, Matewan Weeks in Treatment: 30 Diagnosis Coding ICD-10 Codes Code Description E11.51 Type 2 diabetes mellitus with diabetic peripheral angiopathy without gangrene L97.211 Non-pressure chronic ulcer of right calf limited to breakdown of skin E11.42 Type 2 diabetes mellitus with diabetic polyneuropathy L97.221 Non-pressure chronic ulcer of left calf limited to breakdown of skin I87.333 Chronic venous hypertension (idiopathic) with ulcer and inflammation of bilateral lower extremity B35.3 Tinea pedis L97.521 Non-pressure chronic ulcer of other part of left foot limited to breakdown of skin L97.511 Non-pressure chronic ulcer of other part of right foot limited to breakdown of  skin Facility Procedures The patient participates with Medicare or their insurance follows the Medicare Facility Guidelines: CPT4 Code Description Modifier Quantity [id number] 11042 - DEB SUBQ TISSUE 20 SQ CM/< 1 ICD-10 Diagnosis Description L97.521 Non-pressure chronic ulcer of  other part of left foot limited to breakdown of skin L97.511 Non-pressure chronic ulcer of other part of right foot  limited to breakdown of skin L97.221 Non-pressure chronic ulcer of left calf limited to breakdown of skin L97.211 Non-pressure chronic  ulcer of right calf limited to breakdown of skin Physician Procedures CPT4 Code Description: [id number] 11042 - WC PHYS SUBQ TISS 20 SQ CM ICD-10 Diagnosis Description L97.521 Non-pressure chronic ulcer of other part of left foot li L97.511 Non-pressure chronic ulcer of other part of right foot l L97.221 Non-pressure chronic  ulcer of left calf limited to break L97.211 Non-pressure chronic ulcer of right calf limited to brea Modifier: mited to breakdo imited to breakd down of skin kdown of skin Quantity: 1 wn of skin own of skin Electronic Signature(s) Signed: 09/26/2019 6:15:30 PM By: [name] MD Entered By: [name] on 09/26/2019 10:07:20

## 2019-09-26 NOTE — Telephone Encounter (Signed)
Patient called asking about starting lasix.  Stated that they were told by his wound doctor he should have started by [facility] yesterday?

## 2019-09-26 NOTE — Telephone Encounter (Signed)
Will forward to Cecilie Kicks to advise.

## 2019-09-26 NOTE — Telephone Encounter (Signed)
I sent lab results to [facility], I hope they were done, but I added Lasix 40 once per day.  But if she is not check ing her box please someone check ! Thanks.

## 2019-09-26 NOTE — Telephone Encounter (Signed)
Patient called the wound doctor and they told him that Cecilie Kicks said he was to start Lasix

## 2019-09-26 NOTE — Telephone Encounter (Signed)
Cardiac Cath Wednesday Oct 04, 2019 with Dr Daneen Schick

## 2019-09-27 ENCOUNTER — Other Ambulatory Visit: Payer: Self-pay

## 2019-09-27 ENCOUNTER — Telehealth: Payer: Self-pay

## 2019-09-27 DIAGNOSIS — I5022 Chronic systolic (congestive) heart failure: Secondary | ICD-10-CM

## 2019-09-27 MED ORDER — FUROSEMIDE 40 MG PO TABS: 40.0000 mg | ORAL_TABLET | Freq: Every day | ORAL | 3 refills | Status: DC

## 2019-09-27 NOTE — Telephone Encounter (Signed)
Spoke with daughter. Medication e-scribed to pharmacy.

## 2019-09-27 NOTE — Telephone Encounter (Signed)
I spoke with daughter, patient cannot break quarantine to have labs done next Monday.He has cath on Wednesday. They will get lab after cath.E-scribed lasix to [facility]

## 2019-09-27 NOTE — Telephone Encounter (Signed)
-----   Message from Isaiah Serge, NP sent at 09/26/2019  1:23 PM EST ----- Let's add lasix 40 mg once a day to meds  recheck BMP on Monday we are doing this because you have a lot of lower extremity fluid.  This will help and is basic dose.

## 2019-09-30 ENCOUNTER — Other Ambulatory Visit (HOSPITAL_COMMUNITY)
Admission: RE | Admit: 2019-09-30 | Discharge: 2019-09-30 | Disposition: A | Discharge status: Home / Self Care | Payer: Medicare HMO | Source: Ambulatory Visit | Attending: Interventional Cardiology | Admitting: Interventional Cardiology

## 2019-09-30 DIAGNOSIS — Z20822 Contact with and (suspected) exposure to covid-19: Secondary | ICD-10-CM | POA: Insufficient documentation

## 2019-09-30 DIAGNOSIS — Z01812 Encounter for preprocedural laboratory examination: Secondary | ICD-10-CM | POA: Insufficient documentation

## 2019-09-30 LAB — SARS CORONAVIRUS 2 (TAT 6-24 HRS): SARS Coronavirus 2: NEGATIVE

## 2019-10-02 NOTE — Progress Notes (Signed)
ALLEN, EGERTON ([id number]) Visit Report for 09/26/2019 Arrival Information Details Patient Name: Date of Service: KAAMIL, MOREFIELD 09/26/2019 8:15 AM Medical Record Dawson Patient Account Number: [id number] Date of Birth/Sex: Treating RN: 01/02/1941 (79 y.o. [name] Primary Care Jonan Seufert: Hurshel Party Other Clinician: Referring Grainne Knights: Treating Alantra Popoca/Extender:Robson, Cheryl Flash, [place] Weeks in Treatment: 30 Visit Information History Since Last Visit Added or deleted any medications: No Patient Arrived: Ambulatory Any new allergies or adverse reactions: No Arrival Time: 08:20 Had a fall or experienced change in No Accompanied By: wife activities of daily living that may affect Transfer Assistance: None risk of falls: Patient Identification Verified: Yes Signs or symptoms of abuse/neglect since last No Secondary Verification Process Yes visito Completed: Hospitalized since last visit: No Patient Requires Transmission- No Implantable device outside of the clinic excluding No Based Precautions: cellular tissue based products placed in the center Patient Has Alerts: Yes since last visit: Patient Alerts: Patient on Blood Has Dressing in Place as Prescribed: Yes Thinner Has Compression in Place as Prescribed: Yes Pain Present Now: No Electronic Signature(s) Signed: 10/02/2019 4:32:10 PM By: Kela Millin Entered By: Kela Millin on 09/26/2019 08:27:50 -------------------------------------------------------------------------------- Compression Therapy Details Patient Name: Date of Service: TAVARIOUS, FREEL 09/26/2019 8:15 AM Medical Record [id number] Patient Account Number: [id number] Date of Birth/Sex: Treating RN: Apr 16, 1941 (78 y.o. [name]) [name] Primary Care Peace Noyes: Hurshel Party Other Clinician: Referring Fabricio Endsley: Treating Nowell Sites/Extender:Robson, Cheryl Flash, [place] Weeks in Treatment: 30 Compression  Therapy Performed for Wound Wound #19 Left,Distal,Medial Lower Leg Assessment: Performed By: Jake Church, RN Compression Type: Three Layer Post Procedure Diagnosis Same as Pre-procedure Electronic Signature(s) Signed: 09/26/2019 5:43:27 PM By: [name] RN Entered By: [name] on 09/26/2019 09:36:37 -------------------------------------------------------------------------------- Compression Therapy Details Patient Name: Date of Service: KOA, ZOELLER 09/26/2019 8:15 AM Medical Record [id number] Patient Account Number: [id number] Date of Birth/Sex: Treating RN: May 22, 1941 (78 y.o. [name]) [name] Primary Care Tempie Gibeault: Hurshel Party Other Clinician: Referring Betzabe Bevans: Treating Lerlene Treadwell/Extender:Robson, Cheryl Flash, [place] Weeks in Treatment: 30 Compression Therapy Performed for Wound Wound #15 Left,Anterior Lower Leg Assessment: Performed By: Jake Church, RN Compression Type: Rolena Infante Post Procedure Diagnosis Same as Pre-procedure Electronic Signature(s) Signed: 09/26/2019 5:43:27 PM By: [name] RN Entered By: [name] on 09/26/2019 09:45:27 -------------------------------------------------------------------------------- Compression Therapy Details Patient Name: Date of Service: CHANCELOR, HARDRICK 09/26/2019 8:15 AM Medical Record [id number] Patient Account Number: [id number] Date of Birth/Sex: Treating RN: 02/23/1941 (78 y.o. [name]) [name] Primary Care Jaislyn Blinn: Hurshel Party Other Clinician: Referring Zyionna Pesce: Treating Shloime Keilman/Extender:Robson, Cheryl Flash, [place] Weeks in Treatment: 30 Compression Therapy Performed for Wound Wound #16 Left,Distal,Anterior Lower Leg Assessment: Performed By: Jake Church, RN Compression Type: Rolena Infante Post Procedure Diagnosis Same as Pre-procedure Electronic Signature(s) Signed: 09/26/2019 5:43:27 PM By: [name] RN Entered By: [name] on  09/26/2019 09:45:39 -------------------------------------------------------------------------------- Compression Therapy Details Patient Name: Date of Service: CORBYN, WILDEY 09/26/2019 8:15 AM Medical Record [id number] Patient Account Number: [id number] Date of Birth/Sex: Treating RN: [birth year] (78 y.o. [name]) [name] Primary Care Liddy Deam: Hurshel Party Other Clinician: Referring Angus Amini: Treating Desjuan Stearns/Extender:Robson, Cheryl Flash, [place] Weeks in Treatment: 30 Compression Therapy Performed for Wound Wound #17 Right,Medial Lower Leg Assessment: Performed By: Jake Church, RN Compression Type: Rolena Infante Post Procedure Diagnosis Same as Pre-procedure Electronic Signature(s) Signed: 09/26/2019 5:43:27 PM By: [name] RN Entered By: [name] on 09/26/2019 09:46:04 -------------------------------------------------------------------------------- Compression Therapy Details Patient Name: Date of Service: CORTNEY, MCKINNEY. 09/26/2019 8:15 AM Medical Record [id number] Patient  Account Number: [id number] Date of Birth/Sex: Treating RN: 1941-01-07 (78 y.o. [name]) [name] Primary Care Mya Suell: Hurshel Party Other Clinician: Referring Samatha Anspach: Treating Shaylene Paganelli/Extender:Robson, Cheryl Flash, [place] Weeks in Treatment: 30 Compression Therapy Performed for Wound Wound #18 Left,Medial Foot Assessment: Performed By: Clinician [name], RN Compression Type: Rolena Infante Post Procedure Diagnosis Same as Pre-procedure Electronic Signature(s) Signed: 09/26/2019 5:43:27 PM By: [name] RN Entered By: [name] on 09/26/2019 09:46:34 -------------------------------------------------------------------------------- Compression Therapy Details Patient Name: Date of Service: JARI, DIPASQUALE 09/26/2019 8:15 AM Medical Record [id number] Patient Account Number: [id number] Date of Birth/Sex: Treating RN: 12-17-1940 (78 y.o. [name])  [name] Primary Care Suzzette Gasparro: Hurshel Party Other Clinician: Referring Mylo Choi: Treating Lonisha Bobby/Extender:Robson, Cheryl Flash, [place] Weeks in Treatment: 30 Compression Therapy Performed for Wound Wound #23 Left,Proximal,Medial Lower Leg Assessment: Performed By: Jake Church, RN Compression Type: Rolena Infante Post Procedure Diagnosis Same as Pre-procedure Electronic Signature(s) Signed: 09/26/2019 5:43:27 PM By: [name] RN Entered By: [name] on 09/26/2019 09:46:55 -------------------------------------------------------------------------------- Compression Therapy Details Patient Name: Date of Service: JAXTEN, BROSH 09/26/2019 8:15 AM Medical Record [id number] Patient Account Number: [id number] Date of Birth/Sex: Treating RN: 1941-02-14 (78 y.o. [name]) [name] Primary Care Lounette Sloan: Hurshel Party Other Clinician: Referring Boykin Baetz: Treating Kellyn Mccary/Extender:Robson, Cheryl Flash, [place] Weeks in Treatment: 30 Compression Therapy Performed for Wound Wound #3R Right,Posterior Calf Assessment: Performed By: Jake Church, RN Compression Type: Three Layer Post Procedure Diagnosis Same as Pre-procedure Electronic Signature(s) Signed: 09/26/2019 5:43:27 PM By: [name] RN Entered By: [name] on 09/26/2019 09:47:03 -------------------------------------------------------------------------------- Encounter Discharge Information Details Patient Name: Date of Service: ANIKET, PAYE 09/26/2019 8:15 AM Medical Record [id number] Patient Account Number: [id number] Date of Birth/Sex: Treating RN: [birth year] (79 y.o. [name] Primary Care Amore Ackman: Hurshel Party Other Clinician: Referring Adamaris King: Treating Steffon Gladu/Extender:Robson, Cheryl Flash, [place] Weeks in Treatment: 30 Encounter Discharge Information Items Post Procedure Vitals Discharge Condition: Stable Temperature (F):  98 Ambulatory Status: Wheelchair Pulse (bpm): 93 Discharge Destination: Home Respiratory Rate (breaths/min): 22 Transportation: Private Auto Blood Pressure (mmHg): 129/68 Accompanied By: wife Schedule Follow-up Appointment: Yes Clinical Summary of Care: Patient Declined Electronic Signature(s) Signed: 10/02/2019 4:32:10 PM By: Kela Millin Entered By: Kela Millin on 09/26/2019 11:06:51 -------------------------------------------------------------------------------- Lower Extremity Assessment Details Patient Name: Date of Service: MARCELLES, CLINARD 09/26/2019 8:15 AM Medical Record [id number] Patient Account Number: [id number] Date of Birth/Sex: Treating RN: 02-28-1941 (79 y.o. [name] Primary Care Gwen Sarvis: Hurshel Party Other Clinician: Referring Elic Vencill: Treating Jamariya Davidoff/Extender:Robson, Cheryl Flash, [place] Weeks in Treatment: 30 Edema Assessment Assessed: [Left: No] [Right: No] Edema: [Left: Yes] [Right: Yes] Calf Left: Right: Point of Measurement: 31 cm From Medial Instep 35 cm 33 cm Ankle Left: Right: Point of Measurement: 11 cm From Medial Instep 21.5 cm 21.5 cm Vascular Assessment Pulses: Dorsalis Pedis Palpable: [Left:Yes] [Right:Yes] Electronic Signature(s) Signed: 10/02/2019 4:32:10 PM By: Kela Millin Entered By: Kela Millin on 09/26/2019 08:40:13 -------------------------------------------------------------------------------- Multi Wound Chart Details Patient Name: Date of Service: [name] 09/26/2019 8:15 AM Medical Record [id number] Patient Account Number: [id number] Date of Birth/Sex: Treating RN: 06/04/1941 (79 y.o. M) Primary Care Isamar Wellbrock: Hurshel Party Other Clinician: Referring Nakeyia Menden: Treating Arleene Settle/Extender:Robson, Cheryl Flash, [place] Weeks in Treatment: 30 Vital Signs Height(in): 74 Pulse(bpm): 93 Weight(lbs): 212 Blood Pressure(mmHg): 129/68 Body Mass Index(BMI):  27 Temperature(F): 98 Respiratory 22 Rate(breaths/min): Photos: [13:No Photos] [14:No Photos] [15:No Photos] Wound Location: [13:Left Toe Second] [14:Left Toe Third] [15:Left Lower Leg - Anterior] Wounding Event: [13:Gradually Appeared] [14:Gradually Appeared] [15:Blister] Primary Etiology: [13:Diabetic Wound/Ulcer  of the Diabetic Wound/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] [14:Lower Extremity] [15:Lower Extremity] Comorbid History: [13:Cataracts, Hypertension, Cataracts, Hypertension, Cataracts, Hypertension, Peripheral Venous Disease, Peripheral Venous Disease, Peripheral Venous Disease, Type II Diabetes, Gout, Received Radiation] [14:Type II Diabetes, Gout,  Received Radiation] [15:Type II Diabetes, Gout, Received Radiation] Date Acquired: [13:08/22/2019] [14:08/22/2019] [15:08/29/2019] Weeks of Treatment: [13:5] [14:5] [15:4] Wound Status: [13:Open] [14:Open] [15:Open] Wound Recurrence: [13:No] [14:No] [15:No] Clustered Wound: [13:No] [14:No] [15:Yes] Clustered Quantity: [13:N/A] [14:N/A] [15:3] Measurements L x W x D 1.3x0.9x0.1 [14:0x0x0] [15:4.5x2.5x0.1] (cm) Area (cm) : [13:0.919] [14:0] [15:8.836] Volume (cm) : [13:0.092] [14:0] [15:0.884] % Reduction in Area: [13:70.20%] [14:100.00%] [15:-7.10%] % Reduction in Volume: 70.10% [14:100.00%] [15:-7.20%] Classification: [13:Grade 1] [14:Grade 1] [15:Grade 2] Exudate Amount: [13:Small] [14:None Present] [15:Medium] Exudate Type: [13:Serous] [14:N/A] [15:Serous] Exudate Color: [13:amber] [14:N/A] [15:amber] Wound Margin: [13:Distinct, outline attached Distinct, outline attached Distinct, outline attached] Granulation Amount: [13:Medium (34-66%)] [14:None Present (0%)] [15:Medium (34-66%)] Granulation Quality: [13:Pink] [14:N/A] [15:Pink] Necrotic Amount: [13:Medium (34-66%)] [14:None Present (0%)] [15:Medium (34-66%)] Exposed Structures: [13:Fat Layer (Subcutaneous Tissue) Exposed: Yes Fascia: No Tendon: No  Muscle: No Joint: No Bone: No] [14:Fascia: No Fat Layer (Subcutaneous Tissue) Exposed: No Tendon: No Muscle: No Joint: No Bone: No] [15:Fat Layer (Subcutaneous Tissue) Exposed: Yes  Fascia: No Tendon: No Muscle: No Joint: No Bone: No] Epithelialization: [13:None] [14:Large (67-100%)] [15:None] Debridement: [13:Debridement - Excisional] [14:N/A] [15:N/A] Pre-procedure [13:09:19] [14:N/A] [15:N/A] Verification/Time Out Taken: Pain Control: [13:Other] [14:N/A] [15:N/A] Tissue Debrided: [13:Subcutaneous, Slough] [14:N/A] [15:N/A] Level: [13:Skin/Subcutaneous Tissue] [14:N/A] [15:N/A] Debridement Area (sq cm):1.17 [14:N/A] [15:N/A] Instrument: [13:Curette] [14:N/A] [15:N/A] Bleeding: [13:Minimum] [14:N/A] [15:N/A] Hemostasis Achieved: [13:Pressure] [14:N/A] [15:N/A] Procedural Pain: [13:0] [14:N/A] [15:N/A] Post Procedural Pain: [13:0] [14:N/A] [15:N/A] Debridement Treatment Procedure was tolerated [14:N/A] [15:N/A] Response: [13:well] Post Debridement [13:1.3x0.9x0.1] [14:N/A] [15:N/A] Measurements L x W x D (cm) Post Debridement [13:0.092] [14:N/A] [15:N/A] Volume: (cm) Procedures Performed: Debridement [14:N/A 16] [15:Compression Therapy 17] Photos: [13:No Photos] [14:No Photos] [15:No Photos] Wound Location: [13:Left Lower Leg - Anterior, Distal] [14:Right Lower Leg - Medial] [15:Left Foot - Medial] Wounding Event: [13:Blister] [14:Blister] [15:Gradually Appeared] Primary Etiology: [13:Venous Leg Ulcer] [14:Venous Leg Ulcer] [15:Diabetic Wound/Ulcer of the Lower Extremity] Comorbid History: [13:Cataracts, Hypertension, Cataracts, Hypertension, Cataracts, Hypertension, Peripheral Venous Disease, Peripheral Venous Disease, Peripheral Venous Disease, Type II Diabetes, Gout, Received Radiation] [14:Type II Diabetes, Gout,  Received Radiation] [15:Type II Diabetes, Gout, Received Radiation] Date Acquired: [13:09/05/2019] [14:09/05/2019] [15:09/03/2019] Weeks of Treatment: [13:3] [14:3]  [15:3] Wound Status: [13:Open] [14:Open] [15:Open] Wound Recurrence: [13:No] [14:No] [15:No] Clustered Wound: [13:Yes] [14:No] [15:No] Clustered Quantity: [13:2] [14:N/A] [15:N/A] Measurements L x W x D 0.7x3.2x0.1 [14:0x0x0] [15:0.6x0.3x0.1] (cm) Area (cm) : [13:1.759] [14:0] [15:0.141] Volume (cm) : [13:0.176] [14:0] [15:0.014] % Reduction in Area: [13:-645.30%] [14:100.00%] [15:86.20%] % Reduction in Volume: -633.30% [14:100.00%] [15:86.30%] Classification: [13:Full Thickness Without Exposed Support Structures Exposed Support Structures] [14:Full Thickness Without] [15:Grade 2] Exudate Amount: [13:Medium] [14:None Present] [15:None Present] Exudate Type: [13:Serous] [14:N/A] [15:N/A] Exudate Color: [13:amber] [14:N/A] [15:N/A] Wound Margin: [13:Distinct, outline attached] [14:Distinct, outline attached] [15:Distinct, outline attached] Granulation Amount: [13:Medium (34-66%)] [14:None Present (0%)] [15:None Present (0%)] Granulation Quality: [13:Pink] [14:N/A] [15:N/A] Necrotic Amount: [13:Medium (34-66%)] [14:None Present (0%)] [15:Large (67-100%)] Exposed Structures: [13:Fat Layer (Subcutaneous Tissue) Exposed: Yes Fascia: No Tendon: No Muscle: No Joint: No Bone: No] [14:Fascia: No Fat Layer (Subcutaneous Tissue) Exposed: No Tendon: No Muscle: No Joint: No Bone: No] [15:Fascia: No Fat Layer (Subcutaneous Tissue)  Exposed: No Tendon: No Muscle: No Joint: No Bone: No] Epithelialization: [13:None] [14:Large (67-100%)] [15:Small (1-33%)]  Debridement: [13:Debridement - Excisional] [14:N/A] [15:N/A] Pre-procedure [13:09:19] [14:N/A] [15:N/A] Verification/Time Out Taken: Pain Control: [13:Other] [14:N/A] [15:N/A] Tissue Debrided: [13:Subcutaneous, Slough] [14:N/A] [15:N/A] Level: [13:Skin/Subcutaneous Tissue] [14:N/A] [15:N/A] Debridement Area (sq cm):2.24 [14:N/A] [15:N/A] Instrument: [13:Curette] [14:N/A] [15:N/A] Bleeding: [13:Minimum] [14:N/A] [15:N/A] Hemostasis Achieved:  [13:Pressure] [14:N/A] [15:N/A] Procedural Pain: [13:0] [14:N/A] [15:N/A] Post Procedural Pain: [13:0] [14:N/A] [15:N/A] Debridement Treatment Procedure was tolerated [14:N/A] [15:N/A] Response: [13:well] Post Debridement [13:0.7x3.2x0.1] [14:N/A] [15:N/A] Measurements L x W x D (cm) Post Debridement [13:0.176] [14:N/A] [15:N/A] Volume: (cm) Procedures Performed: Compression Therapy [13:Debridement] [14:Compression Therapy 19 20] [15:Compression Therapy 21] Photos: [13:No Photos] [14:No Photos] [15:No Photos] Wound Location: [13:Left Lower Leg - Medial Right Toe Great] [15:Right Toe Second] Wounding Event: [13:Gradually Appeared] [14:Gradually Appeared] [15:Gradually Appeared] Primary Etiology: [13:Lymphedema] [14:Diabetic Wound/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] [15:Lower Extremity] Comorbid History: [13:Cataracts, Hypertension, Cataracts, Hypertension, Cataracts, Hypertension, Peripheral Venous Disease, Peripheral Venous Disease, Peripheral Venous Disease, Type II Diabetes, Gout, Received Radiation] [14:Type II Diabetes, Gout,  Received Radiation] [15:Type II Diabetes, Gout, Received Radiation] Date Acquired: [13:09/23/2019] [14:09/23/2019] [15:09/23/2019] Weeks of Treatment: [13:0] [14:0] [15:0] Wound Status: [13:Open] [14:Open] [15:Open] Wound Recurrence: [13:No] [14:No] [15:No] Clustered Wound: [13:No] [14:No] [15:No] Clustered Quantity: [13:N/A] [14:N/A] [15:N/A] Measurements L x W x D 10.5x6.5x0.1 [14:1.3x0.5x0.1] [15:1.2x0.7x0.1] (cm) Area (cm) : [13:53.603] [14:0.511] [15:0.66] Volume (cm) : [13:5.36] [14:0.051] [15:0.066] % Reduction in Area: [13:N/A] [14:0.00%] [15:0.00%] % Reduction in Volume: N/A [14:0.00%] [15:0.00%] Classification: [13:Full Thickness Without Exposed Support Structures] [14:Grade 2] [15:Grade 2] Exudate Amount: [13:Medium] [14:Small] [15:Small] Exudate Type: [13:Serous] [14:Serous] [15:Serous] Exudate Color: [13:amber]  [14:amber] [15:amber] Wound Margin: [13:Distinct, outline attached] [14:Distinct, outline attached] [15:Distinct, outline attached] Granulation Amount: [13:Large (67-100%)] [14:None Present (0%)] [15:None Present (0%)] Granulation Quality: [13:Red, Pink] [14:N/A] [15:N/A] Necrotic Amount: [13:None Present (0%)] [14:Large (67-100%)] [15:Large (67-100%)] Exposed Structures: [13:Fat Layer (Subcutaneous Tissue) Exposed: Yes Fascia: No Tendon: No Muscle: No Joint: No Bone: No] [14:Fascia: No Fat Layer (Subcutaneous Tissue) Exposed: No Tendon: No Muscle: No Joint: No Bone: No] [15:Fascia: No Fat Layer (Subcutaneous Tissue)  Exposed: No Tendon: No Muscle: No Joint: No Bone: No] Epithelialization: [13:None] [14:None] [15:None] Debridement: [13:N/A] [14:N/A] [15:N/A] Pain Control: [13:N/A] [14:N/A] [15:N/A] Tissue Debrided: [13:N/A] [14:N/A] [15:N/A] Level: [13:N/A] [14:N/A] [15:N/A] Debridement Area (sq cm):N/A [14:N/A] [15:N/A] Instrument: [13:N/A] [14:N/A] [15:N/A] Bleeding: [13:N/A] [14:N/A] [15:N/A] Hemostasis Achieved: [13:N/A] [14:N/A] [15:N/A] Procedural Pain: [13:N/A] [14:N/A] [15:N/A] Post Procedural Pain: [13:N/A] [14:N/A] [15:N/A] Debridement Treatment N/A [14:N/A] [15:N/A] Response: Post Debridement [13:N/A] [14:N/A] [15:N/A] Measurements L x W x D (cm) Post Debridement [13:N/A] [14:N/A] [15:N/A] Volume: (cm) Procedures Performed: Compression Therapy [13:22] [14:N/A 23] [15:N/A 3R] Photos: [13:No Photos] [14:No Photos] [15:No Photos] Wound Location: [13:Right Toe Third] [14:Left Lower Leg - Medial] [15:Right Calf - Posterior] Wounding Event: [13:Gradually Appeared] [14:Gradually Appeared] [15:Gradually Appeared] 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] [15:Diabetic Wound/Ulcer of the Lower Extremity] Comorbid History: [13:Cataracts, Hypertension, Cataracts, Hypertension, Cataracts, Hypertension, Peripheral Venous Disease, Peripheral  Venous Disease, Peripheral Venous Disease, Type II Diabetes, Gout, Received Radiation] [14:Type II Diabetes, Gout,  Received Radiation] [15:Type II Diabetes, Gout, Received Radiation] Date Acquired: [13:09/23/2019] [14:09/26/2019] [15:02/28/2019] Weeks of Treatment: [13:0] [14:0] [15:30] Wound Status: [13:Open] [14:Open] [15:Open] Wound Recurrence: [13:No] [14:No] [15:Yes] Clustered Wound: [13:No] [14:No] [15:No] Clustered Quantity: [13:N/A] [14:N/A] [15:N/A] Measurements L x W x D 1.5x1x0.1 [14:0.8x1x0.1] [15:1.6x1.1x0.1] (cm) Area (cm) : [13:1.178] [14:0.628] [15:1.382] Volume (cm) : [13:0.118] [14:0.063] [15:0.138] % Reduction in Area: [13:N/A] [14:N/A] [15:94.40%] % Reduction in Volume: N/A [14:N/A] [15:94.40%] Classification: [13:Grade 2] [14:Full  Thickness Without Exposed Support Structures] [15:Grade 2] Exudate Amount: [13:Small] [14:Small] [15:Medium] Exudate Type: [13:Serous] [14:Serous] [15:Serous] Exudate Color: [13:amber] [14:amber] [15:amber] Wound Margin: [13:Distinct, outline attached Distinct, outline attached Distinct, outline attached] Granulation Amount: [13:Medium (34-66%)] [14:Medium (34-66%)] [15:Large (67-100%)] Granulation Quality: [13:Pink] [14:Pink] [15:Pink] Necrotic Amount: [13:Medium (34-66%)] [14:Medium (34-66%)] [15:Small (1-33%)] Exposed Structures: [13:Fat Layer (Subcutaneous Tissue) Exposed: Yes Fascia: No Tendon: No Muscle: No Joint: No Bone: No] [14:Fat Layer (Subcutaneous Tissue) Exposed: Yes Fascia: No Tendon: No Muscle: No Joint: No Bone: No] [15:Fat Layer (Subcutaneous Tissue) Exposed: Yes  Fascia: No Tendon: No Muscle: No Joint: No Bone: No] Epithelialization: [13:None] [14:None] [15:Small (1-33%)] Debridement: [13:N/A] [14:N/A] [15:Debridement - Excisional] Pre-procedure [13:N/A] [14:N/A] [15:09:19] Verification/Time Out Taken: Pain Control: [13:N/A] [14:N/A] [15:Other] Tissue Debrided: [13:N/A] [14:N/A] [15:Subcutaneous, Slough] Level:  [13:N/A] [14:N/A] [15:Skin/Subcutaneous Tissue] Debridement Area (sq cm):N/A [14:N/A] [15:1.76] Instrument: [13:N/A] [14:N/A] [15:Curette] Bleeding: [13:N/A] [14:N/A] [15:Minimum] Hemostasis Achieved: [13:N/A] [14:N/A] [15:Pressure] Procedural Pain: [13:N/A] [14:N/A] [15:0] Post Procedural Pain: [13:N/A] [14:N/A] [15:0] Debridement Treatment N/A [14:N/A] [15:Procedure was tolerated] Response: [15:well] Post Debridement [13:N/A] [14:N/A] [15:1.6x1.1x0.1] Measurements L x W x D (cm) Post Debridement [13:N/A] [14:N/A] [15:0.138] Volume: (cm) Procedures Performed: N/A [14:Compression Therapy] [15:Compression Therapy Debridement] Treatment Notes Electronic Signature(s) Signed: 09/26/2019 6:15:30 PM By: [name] MD Entered By: [name] on 09/26/2019 09:53:50 -------------------------------------------------------------------------------- Multi-Disciplinary Care Plan Details Patient Name: Date of Service: [name] 09/26/2019 8:15 AM Medical Record [id number] Patient Account Number: [id number] Date of Birth/Sex: Treating RN: 08-15-1941 (78 y.o. [name]) [name] Primary Care Adell Koval: Hurshel Party Other Clinician: Referring Thera Basden: Treating Kentavious Michele/Extender:Robson, Cheryl Flash, Magdalena Weeks in Treatment: 30 Active Inactive Wound/Skin Impairment Nursing Diagnoses: Knowledge deficit related to ulceration/compromised skin integrity Goals: Patient/caregiver will verbalize understanding of skin care regimen Date Initiated: 02/28/2019 Target Resolution Date: 09/29/2019 Goal Status: Active Ulcer/skin breakdown will have a volume reduction of 30% by week 4 Date Initiated: 02/28/2019 Date Inactivated: 04/04/2019 Target Resolution Date: 03/31/2019 Goal Status: Met Ulcer/skin breakdown will have a volume reduction of 50% by week 8 Date Initiated: 04/04/2019 Date Inactivated: 05/09/2019 Target Resolution Date: 05/05/2019 Goal Status: Met Ulcer/skin breakdown will  have a volume reduction of 80% by week 12 Date Initiated: 05/09/2019 Date Inactivated: 06/13/2019 Target Resolution Date: 06/09/2019 Unmet Goal Status: Unmet Reason: comorbities/new wounds Ulcer/skin breakdown will heal within 14 weeks Date Initiated: 06/13/2019 Date Inactivated: 07/11/2019 Target Resolution Date: 07/07/2019 Unmet Goal Status: Unmet Reason: comorbityies Interventions: Assess patient/caregiver ability to obtain necessary supplies Assess patient/caregiver ability to perform ulcer/skin care regimen upon admission and as needed Assess ulceration(s) every visit Notes: Electronic Signature(s) Signed: 09/26/2019 5:43:27 PM By: [name] RN Entered By: [name] on 09/26/2019 08:21:48 -------------------------------------------------------------------------------- Pain Assessment Details Patient Name: Date of Service: DEV, DHONDT 09/26/2019 8:15 AM Medical Record [id number] Patient Account Number: [id number] Date of Birth/Sex: Treating RN: Nov 16, 1940 (79 y.o. [name] Primary Care Pihu Basil: Hurshel Party Other Clinician: Referring Kortney Potvin: Treating Jago Carton/Extender:Robson, Cheryl Flash, [place] Weeks in Treatment: 30 Active Problems Location of Pain Severity and Description of Pain Patient Has Paino No Site Locations Pain Management and Medication Current Pain Management: Electronic Signature(s) Signed: 10/02/2019 4:32:10 PM By: Kela Millin Entered By: Kela Millin on 09/26/2019 08:38:57 -------------------------------------------------------------------------------- Patient/Caregiver Education Details Patient Name: Date of Service: Armando Gang 12/29/2020andnbsp8:15 AM Medical Record [id number] Patient Account Number: [id number] Date of Birth/Gender: Treating RN: 05-20-1941 (78 y.o. [name]) [name] Primary Care Physician: Hurshel Party Other Clinician: Referring Physician: Treating  Physician/Extender:Robson, Cheryl Flash, [facility] Weeks in Treatment: 30 Education Assessment Education Provided  To: Patient Education Topics Provided Wound/Skin Impairment: Methods: Explain/Verbal Responses: State content correctly Electronic Signature(s) Signed: 09/26/2019 5:43:27 PM By: [name] RN Entered By: [name] on 09/26/2019 08:22:11 -------------------------------------------------------------------------------- Wound Assessment Details Patient Name: Date of Service: SEYMORE, BRODOWSKI 09/26/2019 8:15 AM Medical Record [id number] Patient Account Number: [id number] Date of Birth/Sex: Treating RN: [birth year] (79 y.o. [name] Primary Care Aeris Hersman: Hurshel Party Other Clinician: Referring Corwin Kuiken: Treating Reah Justo/Extender:Robson, Cheryl Flash, [place] Weeks in Treatment: 30 Wound Status Wound Number: 13 Primary Diabetic Wound/Ulcer of the Lower Extremity Etiology: Wound Location: Left Toe Second Wound Open Wounding Event: Gradually Appeared Status: Date Acquired: 08/22/2019 Comorbid Cataracts, Hypertension, Peripheral Venous Weeks Of Treatment: 5 History: Disease, Type II Diabetes, Gout, Received Clustered Wound: No Radiation Photos Wound Measurements Length: (cm) 1.3 % Reduction i Width: (cm) 0.9 % Reduction i Depth: (cm) 0.1 Epithelializa Area: (cm) 0.919 Tunneling: Volume: (cm) 0.092 Undermining: Wound Description Classification: Grade 1 Foul Odor Af Wound Margin: Distinct, outline attached Slough/Fibri Exudate Amount: Small Exudate Type: Serous Exudate Color: amber Wound Bed Granulation Amount: Medium (34-66%) Granulation Quality: Pink Fascia Expos Necrotic Amount: Medium (34-66%) Fat Layer (S Necrotic Quality: Adherent Slough Tendon Expos Muscle Expos Joint Expose Bone Exposed ter Cleansing: No no Yes Exposed Structure ed: No ubcutaneous Tissue) Exposed: Yes ed: No ed: No d: No : No n Area:  70.2% n Volume: 70.1% tion: None No No Treatment Notes Wound #13 (Left Toe Second) 1. Cleanse With Wound Cleanser Soap and water 2. Periwound Care Moisturizing lotion TCA Cream 3. Primary Dressing Applied Calcium Alginate Ag 4. Secondary Dressing Roll Gauze 5. Secured With Recruitment consultant) Signed: 09/27/2019 3:39:15 PM By: Mikeal Hawthorne EMT/HBOT Signed: 10/02/2019 4:32:10 PM By: Kela Millin Entered By: Mikeal Hawthorne on 09/27/2019 15:05:17 -------------------------------------------------------------------------------- Wound Assessment Details Patient Name: Date of Service: LAMONTE, HARTT 09/26/2019 8:15 AM Medical Record [id number] Patient Account Number: [id number] Date of Birth/Sex: Treating RN: 12-25-1940 (79 y.o. [name] Primary Care Ardene Remley: Hurshel Party Other Clinician: Referring Rohin Krejci: Treating Shondell Fabel/Extender:Robson, Cheryl Flash, [place] Weeks in Treatment: 30 Wound Status Wound Number: 14 Primary Diabetic Wound/Ulcer of the Lower Extremity Etiology: Wound Location: Left Toe Third Wound Open Wounding Event: Gradually Appeared Status: Date Acquired: 08/22/2019 Comorbid Cataracts, Hypertension, Peripheral Venous Weeks Of Treatment: 5 History: Disease, Type II Diabetes, Gout, Received Clustered Wound: No Radiation Photos Wound Measurements Length: (cm) 0 % Reduction Width: (cm) 0 % Reduction Depth: (cm) 0 Epithelializ Area: (cm) 0 Tunneling: Volume: (cm) 0 Undermining Wound Description Classification: Grade 1 Wound Margin: Distinct, outline attached Exudate Amount: None Present Wound Bed Granulation Amount: None Present (0%) Necrotic Amount: None Present (0%) Foul Odor After Cleansing: No Exposed Structure Fascia Exposed: No Fat Layer (Subcutaneous Tissue) Exposed: No Tendon Exposed: No Muscle Exposed: No Joint Exposed: No Bone Exposed: No in Area: 100% in Volume: 100% ation: Large  (67-100%) No : No Treatment Notes Wound #14 (Left Toe Third) 1. Cleanse With Wound Cleanser Soap and water 2. Periwound Care Moisturizing lotion TCA Cream 3. Primary Dressing Applied Calcium Alginate Ag 4. Secondary Dressing Roll Gauze 5. Secured With Recruitment consultant) Signed: 09/27/2019 3:39:15 PM By: Mikeal Hawthorne EMT/HBOT Signed: 10/02/2019 4:32:10 PM By: Kela Millin Entered By: Mikeal Hawthorne on 09/27/2019 15:07:58 -------------------------------------------------------------------------------- Wound Assessment Details Patient Name: Date of Service: MCARTHUR, IVINS 09/26/2019 8:15 AM Medical Record [id number] Patient Account Number: [id number] Date of Birth/Sex: Treating RN: 06-12-1941 (79 y.o. [name] Primary Care Alphia Behanna: Hurshel Party Other Clinician: Referring Criss Bartles: Treating Veniamin Kincaid/Extender:Robson,  Cheryl Flash, [place] Weeks in Treatment: 30 Wound Status Wound Number: 15 Primary Diabetic Wound/Ulcer of the Lower Extremity Etiology: Wound Location: Left Lower Leg - Anterior Wound Open Wounding Event: Blister Status: Date Acquired: 08/29/2019 Comorbid Cataracts, Hypertension, Peripheral Venous Weeks Of Treatment: 4 History: Disease, Type II Diabetes, Gout, Received Clustered Wound: Yes Radiation Photos Wound Measurements Length: (cm) 4.5 Width: (cm) 2.5 Depth: (cm) 0.1 Clustered Quantity: 3 Area: (cm) 8.836 Volume: (cm) 0.884 Wound Description Classification: Grade 2 Wound Margin: Distinct, outline attached Exudate Amount: Medium Exudate Type: Serous Exudate Color: amber Wound Bed Granulation Amount: Medium (34-66%) Granulation Quality: Pink Necrotic Amount: Medium (34-66%) Necrotic Quality: Adherent Slough After Cleansing: No brino Yes Exposed Structure posed: No (Subcutaneous Tissue) Exposed: Yes posed: No posed: No osed: No sed: No % Reduction in Area: -7.1% % Reduction in Volume:  -7.2% Epithelialization: None Tunneling: No Undermining: No Foul Odor Slough/Fi Fascia Ex Fat Layer Tendon Ex Muscle Ex Joint Exp Bone Expo Treatment Notes Wound #15 (Left, Anterior Lower Leg) 1. Cleanse With Wound Cleanser Soap and water 2. Periwound Care TCA Cream 3. Primary Dressing Applied Iodoflex 4. Secondary Dressing ABD Pad Dry Gauze 6. Support Layer Applied Kerlix/Coban Product manager) Signed: 09/27/2019 3:39:15 PM By: Mikeal Hawthorne EMT/HBOT Signed: 10/02/2019 4:32:10 PM By: Kela Millin Entered By: Mikeal Hawthorne on 09/27/2019 15:04:26 -------------------------------------------------------------------------------- Wound Assessment Details Patient Name: Date of Service: KAYIN, OSMENT 09/26/2019 8:15 AM Medical Record [id number] Patient Account Number: [id number] Date of Birth/Sex: Treating RN: Jun 28, 1941 (79 y.o. [name] Primary Care Mycal Conde: Hurshel Party Other Clinician: Referring Vedha Tercero: Treating Maxwell Lemen/Extender:Robson, Cheryl Flash, [place] Weeks in Treatment: 30 Wound Status Wound Number: 16 Primary Venous Leg Ulcer Etiology: Wound Location: Left Lower Leg - Anterior, Distal Wound Open Wounding Event: Blister Status: Date Acquired: 09/05/2019 Comorbid Cataracts, Hypertension, Peripheral Venous Weeks Of Treatment: 3 History: Disease, Type II Diabetes, Gout, Received Clustered Wound: Yes Radiation Photos Wound Measurements Length: (cm) 0.7 % Reduct Width: (cm) 3.2 % Reduct Depth: (cm) 0.1 Epitheli Clustered Quantity: 2 Tunnelin Area: (cm) 1.759 Undermi Volume: (cm) 0.176 Wound Description Full Thickness Without Exposed Support Foul Od Classification: Structures Slough/ Wound Distinct, outline attached Margin: Exudate Medium Amount: Exudate Serous Type: Exudate amber Color: Wound Bed Granulation Amount: Medium (34-66%) Granulation Quality: Pink Fascia Necrotic Amount:  Medium (34-66%) Fat Lay Necrotic Quality: Adherent Slough Tendon Muscle Joint Expos Bone Expose or After Cleansing: No Fibrino Yes Exposed Structure Exposed: No er (Subcutaneous Tissue) Exposed: Yes Exposed: No Exposed: No ed: No d: No ion in Area: -645.3% ion in Volume: -633.3% alization: None g: No ning: No Treatment Notes Wound #16 (Left, Distal, Anterior Lower Leg) 1. Cleanse With Wound Cleanser Soap and water 2. Periwound Care TCA Cream 3. Primary Dressing Applied Iodoflex 4. Secondary Dressing ABD Pad Dry Gauze 6. Support Layer Applied Kerlix/Coban Product manager) Signed: 09/27/2019 3:39:15 PM By: Mikeal Hawthorne EMT/HBOT Signed: 10/02/2019 4:32:10 PM By: Kela Millin Entered By: Mikeal Hawthorne on 09/27/2019 15:03:54 -------------------------------------------------------------------------------- Wound Assessment Details Patient Name: Date of Service: JACLYN, CAREW 09/26/2019 8:15 AM Medical Record [id number] Patient Account Number: [id number] Date of Birth/Sex: Treating RN: 02-22-1941 (79 y.o. [name] Primary Care Tyanna Hach: Hurshel Party Other Clinician: Referring Cheray Pardi: Treating Hala Narula/Extender:Robson, Cheryl Flash, [place] Weeks in Treatment: 30 Wound Status Wound Number: 17 Primary Venous Leg Ulcer Etiology: Wound Location: Right Lower Leg - Medial Wound Open Wounding Event: Blister Status: Date Acquired: 09/05/2019 Comorbid Cataracts, Hypertension, Peripheral Venous Weeks Of Treatment: 3 History: Disease, Type  II Diabetes, Gout, Received Clustered Wound: No Radiation Photos Wound Measurements Length: (cm) 0 % Reduct Width: (cm) 0 % Reduct Depth: (cm) 0 Epitheli Area: (cm) 0 Tunneli Volume: (cm) 0 Undermi Wound Description Classification: Full Thickness Without Exposed Support Foul Od Structures Slough/ Wound Distinct, outline attached Margin: Exudate None Present Amount: Wound  Bed Granulation Amount: None Present (0%) Necrotic Amount: None Present (0%) Fascia Fat Lay Tendon Muscle Joint E Bone Ex or After Cleansing: No Fibrino No Exposed Structure Exposed: No er (Subcutaneous Tissue) Exposed: No Exposed: No Exposed: No xposed: No posed: No ion in Area: 100% ion in Volume: 100% alization: Large (67-100%) ng: No ning: No Treatment Notes Wound #17 (Right, Medial Lower Leg) 1. Cleanse With Wound Cleanser Soap and water 2. Periwound Care TCA Cream 3. Primary Dressing Applied Hydrofera Blue 4. Secondary Dressing ABD Pad 6. Support Layer Applied Kerlix/Coban Product manager) Signed: 09/27/2019 3:39:15 PM By: Mikeal Hawthorne EMT/HBOT Signed: 10/02/2019 4:32:10 PM By: Kela Millin Entered By: Mikeal Hawthorne on 09/27/2019 15:02:58 -------------------------------------------------------------------------------- Wound Assessment Details Patient Name: Date of Service: ZOHAIR, EPP 09/26/2019 8:15 AM Medical Record [id number] Patient Account Number: [id number] Date of Birth/Sex: Treating RN: Sep 09, 1941 (79 y.o. [name] Primary Care Ruhi Kopke: Hurshel Party Other Clinician: Referring Melvia Matousek: Treating Lane Kjos/Extender:Robson, Cheryl Flash, [place] Weeks in Treatment: 30 Wound Status Wound Number: 18 Primary Diabetic Wound/Ulcer of the Lower Extremity Etiology: Wound Location: Left Foot - Medial Wound Open Wounding Event: Gradually Appeared Status: Date Acquired: 09/03/2019 Comorbid Cataracts, Hypertension, Peripheral Venous Weeks Of Treatment: 3 History: Disease, Type II Diabetes, Gout, Received Clustered Wound: No Radiation Photos Wound Measurements Length: (cm) 0.6 % Reduction in Are Width: (cm) 0.3 % Reduction in Vol Depth: (cm) 0.1 Epithelialization: Area: (cm) 0.141 Tunneling: Volume: (cm) 0.014 Undermining: Wound Description Classification: Grade 2 Foul Odor After Cl Wound  Margin: Distinct, outline attached Slough/Fibrino Exudate Amount: None Present Wound Bed Granulation Amount: None Present (0%) Necrotic Amount: Large (67-100%) Fascia Exposed: Necrotic Quality: Adherent Slough Fat Layer Welton Flakes Tendon Exposed: Muscle Exposed: Joint Exposed: Bone Exposed: eansing: No Yes Exposed Structure No neous Tissue) Exposed: No No No No No a: 86.2% ume: 86.3% Small (1-33%) No No Treatment Notes Wound #18 (Left, Medial Foot) 1. Cleanse With Wound Cleanser Soap and water 2. Periwound Care TCA Cream 3. Primary Dressing Applied Hydrofera Blue 4. Secondary Dressing ABD Pad 6. Support Layer Applied Kerlix/Coban Product manager) Signed: 09/27/2019 3:39:15 PM By: Mikeal Hawthorne EMT/HBOT Signed: 10/02/2019 4:32:10 PM By: Kela Millin Entered By: Mikeal Hawthorne on 09/27/2019 15:04:52 -------------------------------------------------------------------------------- Wound Assessment Details Patient Name: Date of Service: JAICEON, COLLISTER 09/26/2019 8:15 AM Medical Record [id number] Patient Account Number: [id number] Date of Birth/Sex: Treating RN: [birth year] (79 y.o. [name] Primary Care Chi Garlow: Hurshel Party Other Clinician: Referring Kaven Cumbie: Treating Kaylen Motl/Extender:Robson, Cheryl Flash, [place] Weeks in Treatment: 30 Wound Status Wound Number: 19 Primary Venous Leg Ulcer Etiology: Wound Location: Left Lower Leg - Medial, Distal Wound Open Wounding Event: Gradually Appeared Status: Date Acquired: 09/23/2019 Comorbid Cataracts, Hypertension, Peripheral Venous Weeks Of Treatment: 0 History: Disease, Type II Diabetes, Gout, Received Clustered Wound: No Radiation Photos Wound Measurements Length: (cm) 10.5 % Reduct Width: (cm) 6.5 % Reduct Depth: (cm) 0.1 Epitheli Area: (cm) 53.603 Tunneli Volume: (cm) 5.36 Undermi Wound Description Full Thickness Without Exposed Support Foul  Od Classification: Structures Slough/ Wound Distinct, outline attached Margin: Exudate Medium Amount: Exudate Serous Type: Exudate amber Color: Wound Bed Granulation Amount: Large (67-100%) Granulation Quality: Red, Pink Fascia  Expos Necrotic Amount: None Present (0%) Fat Layer (S Tendon Expos Muscle Expos Joint Expose Bone Exposed or After Cleansing: No Fibrino No Exposed Structure ed: No ubcutaneous Tissue) Exposed: Yes ed: No ed: No d: No : No ion in Area: 0% ion in Volume: 0% alization: None ng: No ning: No Treatment Notes Wound #19 (Left, Distal, Medial Lower Leg) 1. Cleanse With Wound Cleanser Soap and water 2. Periwound Care TCA Cream 3. Primary Dressing Applied Hydrofera Blue 4. Secondary Dressing ABD Pad 6. Support Layer Applied Kerlix/Coban Product manager) Signed: 09/27/2019 3:39:15 PM By: Mikeal Hawthorne EMT/HBOT Signed: 10/02/2019 4:32:10 PM By: Kela Millin Entered By: Mikeal Hawthorne on 09/27/2019 15:08:25 -------------------------------------------------------------------------------- Wound Assessment Details Patient Name: Date of Service: LEVITICUS, HARTON 09/26/2019 8:15 AM Medical Record [id number] Patient Account Number: [id number] Date of Birth/Sex: Treating RN: October 01, 1940 (79 y.o. [name] Primary Care Aliah Eriksson: Hurshel Party Other Clinician: Referring Sayer Masini: Treating Maloree Uplinger/Extender:Robson, Cheryl Flash, [place] Weeks in Treatment: 30 Wound Status Wound Number: 20 Primary Diabetic Wound/Ulcer of the Lower Extremity Etiology: Wound Location: Right Toe Great Wound Open Wounding Event: Gradually Appeared Status: Date Acquired: 09/23/2019 Comorbid Cataracts, Hypertension, Peripheral Venous Weeks Of Treatment: 0 History: Disease, Type II Diabetes, Gout, Received Clustered Wound: No Radiation Photos Wound Measurements Length: (cm) 1.3 Width: (cm) 0.5 Depth: (cm) 0.1 Area:  (cm) 0.511 Volume: (cm) 0.051 Wound Description Classification: Grade 2 Wound Margin: Distinct, outline attached Exudate Amount: Small Exudate Type: Serous Exudate Color: amber Wound Bed Granulation Amount: None Present (0%) Necrotic Amount: Large (67-100%) Necrotic Quality: Adherent Slough After Cleansing: No rino Yes Exposed Structure osed: No (Subcutaneous Tissue) Exposed: No osed: No osed: No sed: No ed: No % Reduction in Area: 0% % Reduction in Volume: 0% Epithelialization: None Tunneling: No Undermining: No Foul Odor Slough/Fib Fascia Exp Fat Layer Tendon Exp Muscle Exp Joint Expo Bone Expos Treatment Notes Wound #20 (Right Toe Great) 1. Cleanse With Wound Cleanser Soap and water 2. Periwound Care Moisturizing lotion TCA Cream 3. Primary Dressing Applied Calcium Alginate Ag 4. Secondary Dressing Roll Gauze 5. Secured With Recruitment consultant) Signed: 09/27/2019 3:39:15 PM By: Mikeal Hawthorne EMT/HBOT Signed: 10/02/2019 4:32:10 PM By: Kela Millin Entered By: Mikeal Hawthorne on 09/27/2019 15:08:46 -------------------------------------------------------------------------------- Wound Assessment Details Patient Name: Date of Service: ORLYN, ODONOGHUE 09/26/2019 8:15 AM Medical Record [id number] Patient Account Number: [id number] Date of Birth/Sex: Treating RN: 10/30/1940 (79 y.o. [name] Primary Care Elly Haffey: Hurshel Party Other Clinician: Referring Teisha Trowbridge: Treating Jariya Reichow/Extender:Robson, Cheryl Flash, [place] Weeks in Treatment: 30 Wound Status Wound Number: 21 Primary Diabetic Wound/Ulcer of the Lower Extremity Etiology: Wound Location: Right Toe Second Wound Open Wounding Event: Gradually Appeared Status: Date Acquired: 09/23/2019 Comorbid Cataracts, Hypertension, Peripheral Venous Weeks Of Treatment: 0 History: Disease, Type II Diabetes, Gout, Received Clustered Wound:  No Radiation Photos Wound Measurements Length: (cm) 1.2 % Reduction in Width: (cm) 0.7 % Reduction in Depth: (cm) 0.1 Epithelializat Area: (cm) 0.66 Tunneling: Volume: (cm) 0.066 Undermining: Wound Description Classification: Grade 2 Foul Odor Aft Wound Margin: Distinct, outline attached Slough/Fibrin Exudate Amount: Small Exudate Type: Serous Exudate Color: amber Wound Bed Granulation Amount: None Present (0%) Necrotic Amount: Large (67-100%) Fascia Expose Necrotic Quality: Adherent Slough Fat Layer (Su Tendon Expose Muscle Expose Joint Exposed Bone Exposed: er Cleansing: No o Yes Exposed Structure d: No bcutaneous Tissue) Exposed: No d: No d: No : No No Area: 0% Volume: 0% ion: None No No Treatment Notes Wound #21 (Right Toe Second) 1. Cleanse With Wound  Cleanser Soap and water 2. Periwound Care Moisturizing lotion TCA Cream 3. Primary Dressing Applied Calcium Alginate Ag 4. Secondary Dressing Roll Gauze 5. Secured With Recruitment consultant) Signed: 09/27/2019 3:39:15 PM By: Mikeal Hawthorne EMT/HBOT Signed: 10/02/2019 4:32:10 PM By: Kela Millin Entered By: Mikeal Hawthorne on 09/27/2019 15:09:11 -------------------------------------------------------------------------------- Wound Assessment Details Patient Name: Date of Service: EMAURI, KRYGIER 09/26/2019 8:15 AM Medical Record [id number] Patient Account Number: [id number] Date of Birth/Sex: Treating RN: 01-09-1941 (79 y.o. [name] Primary Care Kaylynn Chamblin: Hurshel Party Other Clinician: Referring Adon Gehlhausen: Treating Vannia Pola/Extender:Robson, Cheryl Flash, [place] Weeks in Treatment: 30 Wound Status Wound Number: 22 Primary Diabetic Wound/Ulcer of the Lower Extremity Etiology: Wound Location: Right Toe Third Wound Open Wounding Event: Gradually Appeared Status: Date Acquired: 09/23/2019 Comorbid Cataracts, Hypertension, Peripheral Venous Weeks Of  Treatment: 0 History: Disease, Type II Diabetes, Gout, Received Clustered Wound: No Radiation Photos Wound Measurements Length: (cm) 1.5 % Reduction Width: (cm) 1 % Reduction Depth: (cm) 0.1 Epithelializ Area: (cm) 1.178 Tunneling: Volume: (cm) 0.118 Undermining Wound Description Classification: Grade 2 Wound Margin: Distinct, outline attached Exudate Amount: Small Exudate Type: Serous Exudate Color: amber Wound Bed Granulation Amount: Medium (34-66%) Granulation Quality: Pink Necrotic Amount: Medium (34-66%) Necrotic Quality: Adherent Slough Foul Odor After Cleansing: No Slough/Fibrino Yes Exposed Structure Fascia Exposed: No Fat Layer (Subcutaneous Tissue) Exposed: Yes Tendon Exposed: No Muscle Exposed: No Joint Exposed: No Bone Exposed: No in Area: 0% in Volume: 0% ation: None No : No Treatment Notes Wound #22 (Right Toe Third) 1. Cleanse With Wound Cleanser Soap and water 2. Periwound Care Moisturizing lotion TCA Cream 3. Primary Dressing Applied Calcium Alginate Ag 4. Secondary Dressing Roll Gauze 5. Secured With Recruitment consultant) Signed: 09/27/2019 3:39:15 PM By: Mikeal Hawthorne EMT/HBOT Signed: 10/02/2019 4:32:10 PM By: Kela Millin Entered By: Mikeal Hawthorne on 09/27/2019 15:09:34 -------------------------------------------------------------------------------- Wound Assessment Details Patient Name: Date of Service: ARRIE, ZUERCHER 09/26/2019 8:15 AM Medical Record [id number] Patient Account Number: [id number] Date of Birth/Sex: Treating RN: 20-Jan-1941 (79 y.o. [name] Primary Care Preslei Blakley: Hurshel Party Other Clinician: Referring Jostin Rue: Treating Liam Bossman/Extender:Robson, Cheryl Flash, [place] Weeks in Treatment: 30 Wound Status Wound Number: 23 Primary Venous Leg Ulcer Etiology: Wound Location: Left Lower Leg - Medial, Proximal Wound Open Wounding Event: Gradually Appeared Status: Date  Acquired: 09/26/2019 ComorbidCataracts, Hypertension, Peripheral Venous Weeks Of Treatment: 0 Weeks Of Treatment: 0 History: Disease, Type II Diabetes, Gout, Received Clustered Wound: No Radiation Photos Wound Measurements Length: (cm) 0.8 % Reduct Width: (cm) 1 % Reduct Depth: (cm) 0.1 Epitheli Area: (cm) 0.628 Tunneli Volume: (cm) 0.063 Undermi Wound Description Classification: Full Thickness Without Exposed Support Foul Odo Structures Slough/F Wound Distinct, outline attached Margin: Exudate Small Amount: Exudate Serous Type: Exudate amber Color: Wound Bed Granulation Amount: Medium (34-66%) Granulation Quality: Pink Fascia E Necrotic Amount: Medium (34-66%) Fat Laye Necrotic Quality: Adherent Slough Tendon E Muscle E Joint Ex Bone Exp r After Cleansing: No ibrino Yes Exposed Structure xposed: No r (Subcutaneous Tissue) Exposed: Yes xposed: No xposed: No posed: No osed: No ion in Area: 0% ion in Volume: 0% alization: None ng: No ning: No Treatment Notes Wound #23 (Left, Proximal, Medial Lower Leg) 1. Cleanse With Wound Cleanser Soap and water 2. Periwound Care TCA Cream 3. Primary Dressing Applied Hydrofera Blue 4. Secondary Dressing ABD Pad 6. Support Layer Applied Kerlix/Coban Product manager) Signed: 09/27/2019 3:39:15 PM By: Mikeal Hawthorne EMT/HBOT Signed: 10/02/2019 4:32:10 PM By: Kela Millin Entered By: Mikeal Hawthorne on 09/27/2019 15:09:56 --------------------------------------------------------------------------------  Wound Assessment Details Patient Name: Date of Service: DHANI, DANNEMILLER 09/26/2019 8:15 AM Medical Record [place] Patient Account Number: [id number] Date of Birth/Sex: Treating RN: 01-25-1941 (79 y.o. [name] Primary Care Damyiah Moxley: Hurshel Party Other Clinician: Referring Raschelle Wisenbaker: Treating Honour Schwieger/Extender:Robson, Cheryl Flash, [place] Weeks in Treatment:  30 Wound Status Wound Number: [id number] Primary Diabetic Wound/Ulcer of the Lower Extremity Etiology: Wound Location: Right Calf - Posterior Wound Open Wounding Event: Gradually Appeared Status: Date Acquired: 02/28/2019 Comorbid Cataracts, Hypertension, Peripheral Venous Weeks Of Treatment: 30 History: Disease, Type II Diabetes, Gout, Received Clustered Wound: No Radiation Photos Wound Measurements Length: (cm) 1.6 Width: (cm) 1.1 Depth: (cm) 0.1 Area: (cm) 1.382 Volume: (cm) 0.138 Wound Description Classification: Grade 2 Wound Margin: Distinct, outline attached Exudate Amount: Medium Exudate Type: Serous Exudate Color: amber Wound Bed Granulation Amount: Large (67-100%) Granulation Quality: Pink Necrotic Amount: Small (1-33%) Necrotic Quality: Adherent Slough After Cleansing: No brino Yes Exposed Structure posed: No (Subcutaneous Tissue) Exposed: Yes posed: No posed: No ed: No d: No % Reduction in Area: 94.4% % Reduction in Volume: 94.4% Epithelialization: Small (1-33%) Tunneling: No Undermining: No Foul Odor Slough/Fi Fascia Ex Fat Layer Tendon Ex Muscle Ex Joint Expos Bone Expose Treatment Notes Wound #3R (Right, Posterior Calf) 1. Cleanse With Wound Cleanser Soap and water 2. Periwound Care TCA Cream 3. Primary Dressing Applied Iodoflex 4. Secondary Dressing ABD Pad Dry Gauze 6. Support Layer Applied Kerlix/Coban Product manager) Signed: 09/27/2019 3:39:15 PM By: Mikeal Hawthorne EMT/HBOT Signed: 10/02/2019 4:32:10 PM By: Kela Millin Entered By: Mikeal Hawthorne on 09/27/2019 15:03:28 -------------------------------------------------------------------------------- Vitals Details Patient Name: Date of Service: ELSTER, CORBELLO 09/26/2019 8:15 AM Medical Record [id number] Patient Account Number: [id number] Date of Birth/Sex: Treating RN: [birth year] (79 y.o. [name] Primary Care Carely Nappier: Hurshel Party Other Clinician: Referring Joscelin Fray: Treating Edy Belt/Extender:Robson, Cheryl Flash, [place] Weeks in Treatment: 30 Vital Signs Time Taken: 08:27 Temperature (F): 98 Height (in): 74 Pulse (bpm): 93 Weight (lbs): 212 Respiratory Rate (breaths/min): 22 Body Mass Index (BMI): 27.2 Blood Pressure (mmHg): 129/68 Reference Range: 80 - 120 mg / dl Electronic Signature(s) Signed: 10/02/2019 4:32:10 PM By: Kela Millin Entered By: Kela Millin on 09/26/2019 08:38:51

## 2019-10-03 ENCOUNTER — Telehealth: Payer: Self-pay | Admitting: *Deleted

## 2019-10-03 NOTE — Telephone Encounter (Signed)
Pt contacted pre-catheterization scheduled at [facility] for: Wednesday October 04, 2019 10 AM Verified arrival time and place: [facility]) at: 8 AM   No solid food after midnight prior to cath, clear liquids until 5 AM day of procedure. Contrast allergy: no  Hold: Lasix-AM of procedure Spironolactone-AM of procedure. Glipizide-AM of procedure Coumadin-none 09/30/19 until post procedure.  Except hold medications AM meds can be  taken pre-cath with sip of water including: ASA 81 mg   Confirmed patient has responsible adult to drive home post procedure and observe 24 hours after arriving home: yes  Currently, due to Covid-19 pandemic, only one support person will be allowed with patient. Must be the same support person for that patient's entire stay, will be screened and required to wear a mask. They will be asked to wait in the waiting room for the duration of the patient's stay.  Patients are required to wear a mask when they enter the hospital.     COVID-19 Pre-Screening Questions:  . In the past 7 to 10 days have you had a cough,  shortness of breath, headache, congestion, fever (100 or greater) body aches, chills, sore throat, or sudden loss of taste or sense of smell? no . Have you been around anyone with known Covid 19? no . Have you been around anyone who is awaiting Covid 19 test results in the past 7 to 10 days? no . Have you been around anyone who has been exposed to Covid 19, or has mentioned symptoms of Covid 19 within the past 7 to 10 days? no   I reviewed procedure/mask/visitor instructions, Covid-19 screening questions with patient, he verbalized understanding, thanked me for call.

## 2019-10-04 ENCOUNTER — Other Ambulatory Visit: Payer: Self-pay

## 2019-10-04 ENCOUNTER — Emergency Department (HOSPITAL_COMMUNITY)
Admission: EM | Admit: 2019-10-04 | Discharge: 2019-10-05 | Disposition: A | Discharge status: Home / Self Care | Payer: Medicare HMO | Source: Home / Self Care | Attending: Emergency Medicine | Admitting: Emergency Medicine

## 2019-10-04 ENCOUNTER — Encounter (HOSPITAL_BASED_OUTPATIENT_CLINIC_OR_DEPARTMENT_OTHER): Payer: Medicare HMO | Admitting: Physician Assistant

## 2019-10-04 ENCOUNTER — Encounter (HOSPITAL_COMMUNITY): Payer: Self-pay

## 2019-10-04 ENCOUNTER — Ambulatory Visit (HOSPITAL_COMMUNITY)
Admission: RE | Admit: 2019-10-04 | Discharge: 2019-10-04 | Disposition: A | Discharge status: Home / Self Care | Payer: Medicare HMO | Attending: Interventional Cardiology | Admitting: Interventional Cardiology

## 2019-10-04 ENCOUNTER — Encounter (HOSPITAL_COMMUNITY)
Admission: RE | Disposition: A | Discharge status: Home / Self Care | Payer: Medicare HMO | Source: Home / Self Care | Attending: Interventional Cardiology

## 2019-10-04 DIAGNOSIS — I482 Chronic atrial fibrillation, unspecified: Secondary | ICD-10-CM | POA: Insufficient documentation

## 2019-10-04 DIAGNOSIS — E785 Hyperlipidemia, unspecified: Secondary | ICD-10-CM | POA: Insufficient documentation

## 2019-10-04 DIAGNOSIS — R0602 Shortness of breath: Secondary | ICD-10-CM | POA: Diagnosis not present

## 2019-10-04 DIAGNOSIS — Z8546 Personal history of malignant neoplasm of prostate: Secondary | ICD-10-CM | POA: Diagnosis not present

## 2019-10-04 DIAGNOSIS — Z923 Personal history of irradiation: Secondary | ICD-10-CM | POA: Insufficient documentation

## 2019-10-04 DIAGNOSIS — L97524 Non-pressure chronic ulcer of other part of left foot with necrosis of bone: Secondary | ICD-10-CM | POA: Diagnosis not present

## 2019-10-04 DIAGNOSIS — I959 Hypotension, unspecified: Secondary | ICD-10-CM | POA: Diagnosis not present

## 2019-10-04 DIAGNOSIS — I272 Pulmonary hypertension, unspecified: Secondary | ICD-10-CM | POA: Diagnosis not present

## 2019-10-04 DIAGNOSIS — E559 Vitamin D deficiency, unspecified: Secondary | ICD-10-CM | POA: Diagnosis not present

## 2019-10-04 DIAGNOSIS — M109 Gout, unspecified: Secondary | ICD-10-CM | POA: Insufficient documentation

## 2019-10-04 DIAGNOSIS — I4821 Permanent atrial fibrillation: Secondary | ICD-10-CM | POA: Diagnosis not present

## 2019-10-04 DIAGNOSIS — L97211 Non-pressure chronic ulcer of right calf limited to breakdown of skin: Secondary | ICD-10-CM | POA: Diagnosis not present

## 2019-10-04 DIAGNOSIS — I890 Lymphedema, not elsewhere classified: Secondary | ICD-10-CM | POA: Diagnosis not present

## 2019-10-04 DIAGNOSIS — I872 Venous insufficiency (chronic) (peripheral): Secondary | ICD-10-CM

## 2019-10-04 DIAGNOSIS — L97221 Non-pressure chronic ulcer of left calf limited to breakdown of skin: Secondary | ICD-10-CM | POA: Insufficient documentation

## 2019-10-04 DIAGNOSIS — I110 Hypertensive heart disease with heart failure: Secondary | ICD-10-CM | POA: Diagnosis not present

## 2019-10-04 DIAGNOSIS — B353 Tinea pedis: Secondary | ICD-10-CM | POA: Diagnosis not present

## 2019-10-04 DIAGNOSIS — I87333 Chronic venous hypertension (idiopathic) with ulcer and inflammation of bilateral lower extremity: Secondary | ICD-10-CM | POA: Insufficient documentation

## 2019-10-04 DIAGNOSIS — Z79899 Other long term (current) drug therapy: Secondary | ICD-10-CM | POA: Insufficient documentation

## 2019-10-04 DIAGNOSIS — I5042 Chronic combined systolic (congestive) and diastolic (congestive) heart failure: Secondary | ICD-10-CM | POA: Diagnosis not present

## 2019-10-04 DIAGNOSIS — E1151 Type 2 diabetes mellitus with diabetic peripheral angiopathy without gangrene: Secondary | ICD-10-CM | POA: Diagnosis not present

## 2019-10-04 DIAGNOSIS — E11621 Type 2 diabetes mellitus with foot ulcer: Secondary | ICD-10-CM | POA: Diagnosis not present

## 2019-10-04 DIAGNOSIS — I447 Left bundle-branch block, unspecified: Secondary | ICD-10-CM | POA: Insufficient documentation

## 2019-10-04 DIAGNOSIS — Z7984 Long term (current) use of oral hypoglycemic drugs: Secondary | ICD-10-CM | POA: Insufficient documentation

## 2019-10-04 DIAGNOSIS — Z7952 Long term (current) use of systemic steroids: Secondary | ICD-10-CM | POA: Diagnosis not present

## 2019-10-04 DIAGNOSIS — E119 Type 2 diabetes mellitus without complications: Secondary | ICD-10-CM

## 2019-10-04 DIAGNOSIS — L97511 Non-pressure chronic ulcer of other part of right foot limited to breakdown of skin: Secondary | ICD-10-CM | POA: Diagnosis not present

## 2019-10-04 DIAGNOSIS — Z7901 Long term (current) use of anticoagulants: Secondary | ICD-10-CM | POA: Insufficient documentation

## 2019-10-04 DIAGNOSIS — L03116 Cellulitis of left lower limb: Secondary | ICD-10-CM | POA: Diagnosis not present

## 2019-10-04 DIAGNOSIS — E11622 Type 2 diabetes mellitus with other skin ulcer: Secondary | ICD-10-CM | POA: Diagnosis not present

## 2019-10-04 DIAGNOSIS — I081 Rheumatic disorders of both mitral and tricuspid valves: Secondary | ICD-10-CM | POA: Diagnosis not present

## 2019-10-04 DIAGNOSIS — I10 Essential (primary) hypertension: Secondary | ICD-10-CM | POA: Diagnosis present

## 2019-10-04 DIAGNOSIS — I429 Cardiomyopathy, unspecified: Secondary | ICD-10-CM | POA: Diagnosis not present

## 2019-10-04 DIAGNOSIS — E1142 Type 2 diabetes mellitus with diabetic polyneuropathy: Secondary | ICD-10-CM | POA: Insufficient documentation

## 2019-10-04 DIAGNOSIS — R0609 Other forms of dyspnea: Secondary | ICD-10-CM | POA: Diagnosis not present

## 2019-10-04 DIAGNOSIS — I87332 Chronic venous hypertension (idiopathic) with ulcer and inflammation of left lower extremity: Secondary | ICD-10-CM | POA: Diagnosis not present

## 2019-10-04 DIAGNOSIS — I5022 Chronic systolic (congestive) heart failure: Secondary | ICD-10-CM | POA: Diagnosis not present

## 2019-10-04 DIAGNOSIS — I87311 Chronic venous hypertension (idiopathic) with ulcer of right lower extremity: Secondary | ICD-10-CM | POA: Diagnosis not present

## 2019-10-04 DIAGNOSIS — I4891 Unspecified atrial fibrillation: Secondary | ICD-10-CM | POA: Diagnosis present

## 2019-10-04 HISTORY — PX: RIGHT/LEFT HEART CATH AND CORONARY ANGIOGRAPHY: CATH118266

## 2019-10-04 LAB — POCT I-STAT EG7
Acid-Base Excess: 3 mmol/L — ABNORMAL HIGH (ref 0.0–2.0)
Bicarbonate: 27.8 mmol/L (ref 20.0–28.0)
Calcium, Ion: 1.15 mmol/L (ref 1.15–1.40)
HCT: 37 % — ABNORMAL LOW (ref 39.0–52.0)
Hemoglobin: 12.6 g/dL — ABNORMAL LOW (ref 13.0–17.0)
O2 Saturation: 65 %
Potassium: 3.7 mmol/L (ref 3.5–5.1)
Sodium: 142 mmol/L (ref 135–145)
TCO2: 29 mmol/L (ref 22–32)
pCO2, Ven: 44.5 mmHg (ref 44.0–60.0)
pH, Ven: 7.404 (ref 7.250–7.430)
pO2, Ven: 34 mmHg (ref 32.0–45.0)

## 2019-10-04 LAB — BASIC METABOLIC PANEL
Anion gap: 8 (ref 5–15)
BUN: 30 mg/dL — ABNORMAL HIGH (ref 8–23)
CO2: 26 mmol/L (ref 22–32)
Calcium: 8.9 mg/dL (ref 8.9–10.3)
Chloride: 106 mmol/L (ref 98–111)
Creatinine, Ser: 1.18 mg/dL (ref 0.61–1.24)
GFR calc Af Amer: >60 mL/min (ref >60)
GFR calc non Af Amer: 59 mL/min — ABNORMAL LOW (ref >60)
Glucose, Bld: 162 mg/dL — ABNORMAL HIGH (ref 70–99)
Potassium: 4.3 mmol/L (ref 3.5–5.1)
Sodium: 140 mmol/L (ref 135–145)

## 2019-10-04 LAB — POCT I-STAT 7, (LYTES, BLD GAS, ICA,H+H)
Acid-Base Excess: 3 mmol/L — ABNORMAL HIGH (ref 0.0–2.0)
Bicarbonate: 26.8 mmol/L (ref 20.0–28.0)
Calcium, Ion: 1.16 mmol/L (ref 1.15–1.40)
HCT: 38 % — ABNORMAL LOW (ref 39.0–52.0)
Hemoglobin: 12.9 g/dL — ABNORMAL LOW (ref 13.0–17.0)
O2 Saturation: 96 %
Potassium: 3.9 mmol/L (ref 3.5–5.1)
Sodium: 140 mmol/L (ref 135–145)
TCO2: 28 mmol/L (ref 22–32)
pCO2 arterial: 37.8 mmHg (ref 32.0–48.0)
pH, Arterial: 7.458 — ABNORMAL HIGH (ref 7.350–7.450)
pO2, Arterial: 78 mmHg — ABNORMAL LOW (ref 83.0–108.0)

## 2019-10-04 LAB — PROTIME-INR
INR: 1.6 — ABNORMAL HIGH (ref 0.8–1.2)
Prothrombin Time: 18.5 seconds — ABNORMAL HIGH (ref 11.4–15.2)

## 2019-10-04 LAB — GLUCOSE, CAPILLARY
Glucose-Capillary: 153 mg/dL — ABNORMAL HIGH (ref 70–99)
Glucose-Capillary: 140 mg/dL — ABNORMAL HIGH (ref 70–99)

## 2019-10-04 SURGERY — RIGHT/LEFT HEART CATH AND CORONARY ANGIOGRAPHY
Anesthesia: LOCAL

## 2019-10-04 MED ORDER — LIDOCAINE HCL (PF) 1 % IJ SOLN
INTRAMUSCULAR | Status: AC
  Filled 2019-10-04: qty 30

## 2019-10-04 MED ORDER — VERAPAMIL HCL 2.5 MG/ML IV SOLN
INTRAVENOUS | Status: DC | PRN
  Administered 2019-10-04: 11:00:00 10 mL via INTRA_ARTERIAL

## 2019-10-04 MED ORDER — HEPARIN (PORCINE) IN NACL 1000-0.9 UT/500ML-% IV SOLN
INTRAVENOUS | Status: AC
  Filled 2019-10-04: qty 1000

## 2019-10-04 MED ORDER — HEPARIN SODIUM (PORCINE) 1000 UNIT/ML IJ SOLN
INTRAMUSCULAR | Status: AC
  Filled 2019-10-04: qty 1

## 2019-10-04 MED ORDER — SODIUM CHLORIDE 0.9% FLUSH: 3.0000 mL | INTRAVENOUS | Status: DC | PRN

## 2019-10-04 MED ORDER — HEPARIN SODIUM (PORCINE) 1000 UNIT/ML IJ SOLN
INTRAMUSCULAR | Status: DC | PRN
  Administered 2019-10-04: 5000 [IU] via INTRAVENOUS

## 2019-10-04 MED ORDER — ONDANSETRON HCL 4 MG/2ML IJ SOLN: 4.0000 mg | Freq: Four times a day (QID) | INTRAMUSCULAR | Status: DC | PRN

## 2019-10-04 MED ORDER — SODIUM CHLORIDE 0.9 % IV SOLN: INTRAVENOUS | Status: DC

## 2019-10-04 MED ORDER — WARFARIN SODIUM 5 MG PO TABS: 5.0000 mg | ORAL_TABLET | ORAL | Status: DC

## 2019-10-04 MED ORDER — SODIUM CHLORIDE 0.9% FLUSH: 3.0000 mL | Freq: Two times a day (BID) | INTRAVENOUS | Status: DC

## 2019-10-04 MED ORDER — SODIUM CHLORIDE 0.9 % IV SOLN: 250.0000 mL | INTRAVENOUS | Status: DC | PRN

## 2019-10-04 MED ORDER — FENTANYL CITRATE (PF) 100 MCG/2ML IJ SOLN
INTRAMUSCULAR | Status: AC
  Filled 2019-10-04: qty 2

## 2019-10-04 MED ORDER — LIDOCAINE HCL (PF) 1 % IJ SOLN
INTRAMUSCULAR | Status: DC | PRN
  Administered 2019-10-04 (×2): 2 mL

## 2019-10-04 MED ORDER — HYDRALAZINE HCL 20 MG/ML IJ SOLN: 10.0000 mg | INTRAMUSCULAR | Status: AC | PRN

## 2019-10-04 MED ORDER — HEPARIN (PORCINE) IN NACL 1000-0.9 UT/500ML-% IV SOLN
INTRAVENOUS | Status: DC | PRN
  Administered 2019-10-04 (×2): 500 mL

## 2019-10-04 MED ORDER — MIDAZOLAM HCL 2 MG/2ML IJ SOLN
INTRAMUSCULAR | Status: AC
  Filled 2019-10-04: qty 2

## 2019-10-04 MED ORDER — WARFARIN SODIUM 5 MG PO TABS: 5.0000 mg | ORAL_TABLET | Freq: Once | ORAL | Status: DC

## 2019-10-04 MED ORDER — IOHEXOL 350 MG/ML SOLN
INTRAVENOUS | Status: DC | PRN
  Administered 2019-10-04: 55 mL via INTRA_ARTERIAL

## 2019-10-04 MED ORDER — ASPIRIN 81 MG PO CHEW: 81.0000 mg | CHEWABLE_TABLET | ORAL | Status: DC

## 2019-10-04 MED ORDER — ACETAMINOPHEN 325 MG PO TABS: 650.0000 mg | ORAL_TABLET | ORAL | Status: DC | PRN

## 2019-10-04 MED ORDER — LABETALOL HCL 5 MG/ML IV SOLN: 10.0000 mg | INTRAVENOUS | Status: AC | PRN

## 2019-10-04 MED ORDER — MIDAZOLAM HCL 2 MG/2ML IJ SOLN
INTRAMUSCULAR | Status: DC | PRN
  Administered 2019-10-04: 0.5 mg via INTRAVENOUS

## 2019-10-04 MED ORDER — FENTANYL CITRATE (PF) 100 MCG/2ML IJ SOLN
INTRAMUSCULAR | Status: DC | PRN
  Administered 2019-10-04: 25 ug via INTRAVENOUS

## 2019-10-04 MED ORDER — VERAPAMIL HCL 2.5 MG/ML IV SOLN
INTRAVENOUS | Status: AC
  Filled 2019-10-04: qty 2

## 2019-10-04 SURGICAL SUPPLY — 12 items
CATH 5FR JL3.5 JR4 ANG PIG MP (CATHETERS) ×1 IMPLANT
CATH BALLN WEDGE 5F 110CM (CATHETERS) ×1 IMPLANT
DEVICE RAD COMP TR BAND LRG (VASCULAR PRODUCTS) ×1 IMPLANT
GLIDESHEATH SLEND A-KIT 6F 22G (SHEATH) ×1 IMPLANT
GUIDEWIRE INQWIRE 1.5J.035X260 (WIRE) IMPLANT
INQWIRE 1.5J .035X260CM (WIRE) ×2
KIT HEART LEFT (KITS) ×2 IMPLANT
PACK CARDIAC CATHETERIZATION (CUSTOM PROCEDURE TRAY) ×2 IMPLANT
SHEATH GLIDE SLENDER 4/5FR (SHEATH) ×1 IMPLANT
SHEATH PROBE COVER 6X72 (BAG) ×1 IMPLANT
TRANSDUCER W/STOPCOCK (MISCELLANEOUS) ×2 IMPLANT
TUBING CIL FLEX 10 FLL-RA (TUBING) ×2 IMPLANT

## 2019-10-04 NOTE — CV Procedure (Signed)
   Right and left heart cath with coronary angiography from right antecubital vein and radial artery respectively.  Real-time vascular ultrasound used for arterial access.  Widely patent and essentially normal coronaries for age.  Severe left ventricular systolic dysfunction with EF less than 20%.  Elevated LVEDP (21 mmHg) consistent with acute on chronic combined systolic and diastolic heart failure.  Mild pulmonary hypertension with elevated mean pulmonary capillary wedge, 29 mmHg.

## 2019-10-04 NOTE — Progress Notes (Addendum)
Daniel, Gross ([id number]) Visit Report for 10/04/2019 Chief Complaint Document Details Patient Name: Date of Service: Daniel, Gross 10/04/2019 3:15 PM Medical Record [id number] Patient Account Number: [id number] Date of Birth/Sex: Treating RN: 27-Nov-1940 (79 y.o. M) Primary Care Provider: Hurshel Party Other Clinician: Referring Provider: Treating Provider/Extender:Stone III, Deno Etienne, [place] Weeks in Treatment: 31 Information Obtained from: Patient Chief Complaint chronic venous hypertension with ulcer left leg 02/28/2019; patient returns to clinic with wounds on his bilateral lower legs Electronic Signature(s) Signed: 10/04/2019 4:01:10 PM By: Worthy Keeler PA-C Entered By: Worthy Keeler on 10/04/2019 16:01:10 -------------------------------------------------------------------------------- HPI Details Patient Name: Date of Service: Daniel Pia D. 10/04/2019 3:15 PM Medical Record [id number] Patient Account Number: [id number] Date of Birth/Sex: Treating RN: 01-26-1941 (79 y.o. M) Primary Care Provider: Hurshel Party Other Clinician: Referring Provider: Treating Provider/Extender:Stone III, Deno Etienne, [place] Weeks in Treatment: 31 History of Present Illness Location: Patient presents with a wound to left lower leg. Quality: Patient reports No Pain. Duration: 2 months HPI Description: no cig or alcohol. spontaneous appearance in area of stasis dermamtitis. [name] on metformin only. chronic afib on Coumadin. diabetes and coag studies not good. hba1c 7.5. ivr 4.5. no pain or sxs of systemic disease. hx chf. no intermittent claudication 02/28/2019 Readmission This is a now a 79 year old man who was previously cared for in 2016 by Dr. Lindon Romp for wounds on his lower extremities. At that point he had venous reflux studies although I cannot seem to open these in [place] link. He had arterial studies showing an ABI of 1.11 on the right and 1.27 on the left  his waveforms were triphasic bilaterally. He was discharged in stockings although I do not believe he is wearing these in some time. He tells me that about a month ago he noted openings of a large wound on the posterior right calf and 2 smaller areas on the left lateral calf and a small area more recently on the left posterior calf. He has been dressing these with peroxide and triple antibiotic ointment. He is not wearing compression. Past medical history; type 2 diabetes with peripheral neuropathy, chronic venous insufficiency, hypertension, cardiomyopathy, chronic atrial fibrillation on Coumadin, prostate cancer, hyperlipidemia, gout, ABI in our clinic was 1.34 on the left and not obtainable on the right 6/9; this is a patient who has chronic venous insufficiency. He has a fairly substantial area on the right posterior calf, left lateral calf and a small area on the left posterior calf. On arrival last week he had very palpable popliteal and femoral pulses but nothing in his bilateral feet. Unfortunately we cannot get arterial studies until July 1 at Dr. Kennon Holter office. They live in [name]. We use silver alginate under Kerlix Coban 6/16; patient with chronic venous insufficiency with wounds on his bilateral lower extremities. When he came into our clinic he was discovered to have a complete absence of peripheral pedal pulses at either the dorsalis pedis or posterior tibial. He does have easily palpable femoral and popliteal pulses. He sees Dr. [name] tomorrow for noninvasive arterial tests. He may also require venous reflux evaluation although I do not view this as an urgent thing. We have been using silver alginate. His wound surfaces of cleaned up quite nicely 6/23; patient with chronic venous insufficiency with wounds on his bilateral lower extremities. His wounds all are somewhat better looking. He did go to Dr. Kennon Holter office but somehow ended up on the doctors schedule rather than being  scheduled for noninvasive tests  therefore his noninvasive tests are scheduled for July 1. We agree that he has venous insufficiency ulcers but I cannot feel any pulses in his lower extremities dictating the need for test. We are only using Kerlix and light Coban unfortunately this appears to be holding the edema 6/30; has his arterial studies tomorrow. We have been using Kerlix and light Coban will go to a more aggressive compression if the arterial studies will allow. We all agreed these are venous wounds however I cannot feel pulses at either the dorsalis pedis or posterior tibial bilaterally. His wounds generally look some better including left lateral and right posterior. 7/7-Patient returns at 1 week in Kerlix/Coban to both legs, with improvement, in the left lateral and right posterior lower leg wounds, ABI's are normal in both legs per vascular studies, TBI is also normal on both sides, we are using hydrofera blue to the wounds 7/14; patient's arterial studies from 2 weeks ago showed an ABI on the right at 1.03 with a TBI of 0.86. On the left the ABI was 1.06 with a TBI of 0.84. Notable for the fact that his arterial waveforms were monophasic in all of the lower extremity arteries suggesting some degree of arterial occlusive disease but in general this was felt to be fairly adequate for healing. His compression was increased from 2-3 layer which is appropriate. Dressing was changed to [facility] 7/21; patient's wounds are measuring smaller. The more substantial one on the right posterior calf, second 1 on the left lateral calf. Using [facility] on both wound areas 7/28; patient continues to make nice improvements. The area on the right posterior calf is smaller. Area on the left lateral calf also is smaller. We have been using Hydrofera Blue under compression. The patient will need compression stockings and we have measured him for these in the eventuality that these heal which  really should not be too long from now 8/4-Patient continues to make improvement, the right posterior calf area smaller with rim of keratotic skin on one side, the left wound is definitely smaller and improving. 8/11-Returns at 1 week, after being in 3 layer compression on both legs, both wounds appear to be improving, making good progress, patient is happy, pain is also less especially in the right leg wound 8/18; the area on the left anterior lower leg is healed. On the right posterior leg the wound remains although the dimensions are a lot better. 8/25; he arrives in clinic today with a large body of open wound on the left lateral calf. All of the 3 wounds in this area are in close juxtaposition to each other. The story is that we discharged him last week with no a wrap on the left leg. They went to [facility] could not get in as they are only excepting phone orders or online orders for stockings hence they did not put any stocking on the left leg all week. They have something at home but the patient with that was either incapable or just did not put them on. Apparently these opened 1 morning after getting out of bed. The area on the right has no real change 9/1; patient has bilateral lower extremity wounds in the setting of severe chronic venous insufficiency and secondary lymphedema. He arrived last week with new areas on the left lateral lower leg after we did not wrap him and he did not use his stockings. Nevertheless the areas on the left look better today under compression. Posterior right calf does not  really changed. We are using Hydrofera Blue on both areas under compression 9/15; bilateral lower extremity wounds in the setting of severe chronic venous insufficiency and secondary lymphedema. He has 20 to 30 mmHg below-knee compression stockings under the eventuality that these close over. We did get the left leg to close but he did not transition to a stocking and this reopened. There  are 2 open areas on the left posterior lateral calf and one on the right. Both of these look satisfactory. Using [facility] 9/22; bilateral lower extremity wounds in the setting of chronic venous insufficiency. 2 superficial areas on the left lateral calf. One on the right just above the Achilles area. We have good edema control we have been using Hydrofera Blue 9/29; the areas on the left lateral calf are healed. On the right just above the Achilles and tendon area things look a lot better small wounds one scabbed area. We have been using Hydrofera Blue. We can discharge him in his own stocking on the left still wrapping on the right. This is the second time we have healed the left leg but he did not put a stocking on last time. Hopefully this will maintain the edema from chronic venous disease with secondary lymphedema 10/6; he comes in today having a stocking on the left leg. They had trouble getting it on there is a lot of increase in swelling 2 small open areas one anteriorly and one on the medial calf. They report a lot of difficulty getting the stocking on. Paradoxically the area on the right that we have been wrapping posteriorly is closed 10/13; he comes in today with wounds bilaterally including superficial areas on the left medial and left lateral calf. As well as the right posterior has reopened in the Achilles area superiorly. He still does not have his juxta lite stockings although truthfully we would not of been able to use them today anyway. Apparently have been ordered and paid for from [employer] although they have not been delivered 10/20; his area on the right is just the boat closed on the right posterior. Still has the area on the left lateral and a very tiny area on the left medial. He has his bilateral juxta lites although he is not ready for them this week. He tolerated the increase to 4 layer compression last week quite well 10/27; the area on the right posterior calf is  once again closed. He has a superficial area on the left lateral calf that is still open. He has been using Hydrofera Blue and bilateral 4-layer compression. He can change to his own juxta lite stocking on the right and we are instructing him today 11/3; the area on the right posterior calf reopened according to the patient and his wife after they took off the stocking when they got home last week. Apparently scabbed over there is now a fairly substantial wound which looks pretty much the same. Our intake nurse noted that they were using the juxta lite stockings appropriately. I was really hoping I might be able to close him out today. He has 1 very tiny remaining area on the left lateral lower leg. 11/10; right posterior calf wound measures smaller but is still open. We have been using Hydrofera Blue. On the left he has a small oval-shaped wound and he seems to have had another wound distally that is open and likely a blister. We are using Hydrofera Blue under compression 11/17; right posterior calf wound continues to get better.  We have been using Hydrofera Blue. On the left lateral one of the wounds has closed still a small open area. We have been using Hydrofera Blue on this as well. Both areas have been under 4-layer compression Arrives in clinic today with some swelling in the dorsal foot on the right some erythema of his forefoot and toes. Initially when I looked at this I almost thought this was a sunburn distal to a wrap injury. 12/1; right posterior calf wound debrided with a curette. We have been using Hydrofera Blue on the left anterior lateral he has an area across the mid tibia. Finally a small area on the left lateral lower calf. Finally he continues to have de-epithelialized areas on the dorsal aspect of his toes. Initially thought this might be a burn injury when I saw him 2 weeks ago. I now wonder about tinea. I have also reviewed his arterial studies which were really quite good  in July/20 with normal TBI's and ABIs but monophasic waveforms 12/8; comes in today with worsening problems especially on the left leg where he now has a cluster of wounds in the left anterior mid tibia. Very poor edema control. I reduced him to 3 layer from 4 layer compression last week because of some concern about blood flow to his toes however he does not have good edema control on the left leg. Right leg edema control looks satisfactory. On the left he has a cluster of wounds anteriorly, small area on the left medial fifth met head and then the collection of areas on his toes which appear better On the right he has the original area on the right posterior calf, a new area right medially. His formal arterial studies from mid July are noted below. He was evaluated by Dr.Berry ABI Findings: +---------+------------------+-----+----------+--------+ Right Rt Pressure (mmHg)IndexWaveform Comment  +---------+------------------+-----+----------+--------+ Brachial 176     +---------+------------------+-----+----------+--------+ ATA 176 0.99 monophasic  +---------+------------------+-----+----------+--------+ PTA 183 1.03 monophasic  +---------+------------------+-----+----------+--------+ PERO 172 0.97 monophasic  +---------+------------------+-----+----------+--------+ Great Toe153 0.86    +---------+------------------+-----+----------+--------+ +---------+------------------+-----+-----------+-------+ Left Lt Pressure (mmHg)IndexWaveform Comment +---------+------------------+-----+-----------+-------+ Brachial 178     +---------+------------------+-----+-----------+-------+ ATA 162 0.91 multiphasic  +---------+------------------+-----+-----------+-------+ PTA 188 1.06 multiphasic  +---------+------------------+-----+-----------+-------+ PERO 158 0.89 monophasic    +---------+------------------+-----+-----------+-------+ Great Toe150 0.84    +---------+------------------+-----+-----------+-------+ +-------+-----------+-----------+------------+------------+ ABI/TBIToday's ABIToday's TBIPrevious ABIPrevious TBI +-------+-----------+-----------+------------+------------+ Right 1.03 0.86 1.11   +-------+-----------+-----------+------------+------------+ Left 1.06 0.84 1.27   +-------+-----------+-----------+------------+------------+ Tibial waveforms somewhat difficult to record due to irregular heartbeat. Bilateral ABIs appear essentially unchanged compared to prior study on 06/21/15. Summary: Right: Resting right ankle-brachial index is within normal range. No evidence of significant right lower extremity arterial disease. The right toe-brachial index is normal. Although ankle brachial indices are within normal limits (0.95-1.29), arterial Doppler waveforms at the ankle suggest some component of arterial occlusive disease. Left: Resting left ankle-brachial index is within normal range. No evidence of significant left lower extremity arterial disease. The left toe-brachial index is normal. Although ankle brachial indices are within normal limits (0.95-1.29), arterial Doppler waveforms at the ankle suggest some component of arterial occlusive disease. 12/15; the patient's area on the left mid tibia looks better. Right posterior calf also better. He has the area on the left foot as well. All of his toes look better I think this was tinea. We are using Hydrofera Blue everywhere else The patient was in urgent care yesterday with wheezing and shortness of breath. He got azithromycin and prednisone. He feels better. His lungs are currently clear to auscultation. He was not tested  for Covid 19 12/29; the patient arrives in clinic today with quite a bit change. 2 weeks ago he only had areas on the left mid tibia right posterior calf  with tinea pedis resolving between his toes. He arrives in clinic today with several areas on the dorsal toes on the right, dorsal left second toe. He has skin breakdown in the left medial calf probably from excess edema. Small area proximally in the medial calf. He has weeping edema fluid coming out of the skin excoriations on the left medial calf. He tells me that he is having a cardiac catheterization next week. I had a quick look at [facility] health link. He was found to have an ejection fraction of 25% during the work-up for persistent atrial fibrillation. He saw his cardiology office yesterday seen by the nurse practitioner. She increased his carvedilol. He has not been on diuretics for apparently several months and indeed in the nurse practitioner Dietrich Pates notes she had knowledge of this. 10/04/2019 on evaluation today patient presents as a walk-in visit concerning the fact that he did not have an appointment here for our clinic at this point. He actually had a cardiac catheterization earlier today and then came from there to here in order to be evaluated. With that being said unfortunately he is having significant cellulitis of his left lower extremity upon evaluation today this appears to have deteriorated even since last week's evaluation with Dr. Dellia Nims. The right lower extremity is actually doing okay I really see no evidence of deterioration at this point at those locations. In fact the right leg seems in general be doing quite well. Nonetheless I am concerned about infection and cellulitis of the left lower extremity and again considering his weakened heart I do not want him to develop into sepsis at all. He is also having some trouble breathing today and I understand according to nursing staff this is always the case to some degree. With that being said the patient unfortunately seems to be in my opinion a little bit worse even his wife feels like that may be the case today. Unsure  exactly what is leading to this. Cardiac catheterization I did review the report which showed an ejection fraction of 25% he also had an LAD blockage of around 25% based on what I saw. With that being said there was no significant blockages that required stenting at this point he does have weakened cardiac muscles compared to normal. Electronic Signature(s) Signed: 10/04/2019 5:10:45 PM By: Worthy Keeler PA-C Entered By: Worthy Keeler on 10/04/2019 17:10:45 -------------------------------------------------------------------------------- Physical Exam Details Patient Name: Date of Service: YITZCHOK, CARRIGER 10/04/2019 3:15 PM Medical Record [id number] Patient Account Number: [id number] Date of Birth/Sex: Treating RN: 04-27-1941 (79 y.o. M) Primary Care Provider: Hurshel Party Other Clinician: Referring Provider: Treating Provider/Extender:Stone III, Deno Etienne, [place] Weeks in Treatment: 23 Constitutional Well-nourished and well-hydrated in no acute distress. Respiratory normal breathing without difficulty. Psychiatric this patient is able to make decisions and demonstrates good insight into disease process. Alert and Oriented x 3. pleasant and cooperative. Notes Upon inspection today patient's right lower extremity appears to be doing quite well the wound itself is doing good in my opinion. We typically wrap this leg and it stays in place for a week at a time. With that being said the left lower extremity is unfortunately doing significantly worse. He has severe cellulitis even compared to last week and again I think that oral antibiotic may not even be the  best way to go I am not sure that it would be sufficient and strong enough to manage what needs to be managed at this point. I am concerned about the potential for developing into sepsis. Electronic Signature(s) Signed: 10/04/2019 5:11:29 PM By: Worthy Keeler PA-C Entered By: Worthy Keeler on 10/04/2019  17:11:29 -------------------------------------------------------------------------------- Physician Orders Details Patient Name: Date of Service: MARCELO, ICKES 10/04/2019 3:15 PM Medical Record [id number] Patient Account Number: [id number] Date of Birth/Sex: Treating RN: [birth year] (79 y.o. [name] Primary Care Provider: Other Clinician: Hurshel Party Referring Provider: Treating Provider/Extender:Stone III, Deno Etienne, [place] Weeks in Treatment: 106 Verbal / Phone Orders: No Diagnosis Coding ICD-10 Coding Code Description E11.51 Type 2 diabetes mellitus with diabetic peripheral angiopathy without gangrene L97.211 Non-pressure chronic ulcer of right calf limited to breakdown of skin E11.42 Type 2 diabetes mellitus with diabetic polyneuropathy L97.221 Non-pressure chronic ulcer of left calf limited to breakdown of skin I87.333 Chronic venous hypertension (idiopathic) with ulcer and inflammation of bilateral lower extremity B35.3 Tinea pedis L97.521 Non-pressure chronic ulcer of other part of left foot limited to breakdown of skin L97.511 Non-pressure chronic ulcer of other part of right foot limited to breakdown of skin Follow-up Appointments Return Appointment in 1 week. - or call to schedule appointment after discharge from hospital Dressing Change Frequency Wound #13 Left Toe Second Change dressing every day. Wound #18 Left,Medial Foot Do not change entire dressing for one week. Wound #3R Right,Posterior Calf Do not change entire dressing for one week. Skin Barriers/Peri-Wound Care Wound #18 Left,Medial Foot TCA Cream or Ointment Wound Cleansing May shower with protection. Primary Wound Dressing Wound #13 Left Toe Second Calcium Alginate with Silver Wound #18 Left,Medial Foot Hydrofera Blue Wound #19 Left,Circumferential Lower Leg Other: - wrap with gauze and ABD and kerlix Wound #20 Right Toe Great Hydrofera Blue Wound #21 Right Toe  Second Hydrofera Blue Wound #22 Right Toe Third Hydrofera Blue Wound #3R Right,Posterior Calf Iodoflex Secondary Dressing Wound #13 Left Toe Second Dry Gauze Wound #18 Left,Medial Foot Dry Gauze Wound #20 Right Toe Great Dry Gauze Wound #21 Right Toe Second Dry Gauze Wound #22 Right Toe Third Dry Gauze Wound #3R Right,Posterior Calf Dry Gauze Edema Control Wound #3R Right,Posterior Calf Unna Boot to Right Lower Extremity Off-Loading Open toe surgical shoe to: - left and right foot Additional Orders / Instructions Other: - Go to emergency room for evaluation of left lower leg cellulitis Electronic Signature(s) Signed: 10/09/2019 4:31:37 PM By: Worthy Keeler PA-C Entered By: Worthy Keeler on 10/04/2019 17:14:21 -------------------------------------------------------------------------------- Problem List Details Patient Name: Date of Service: [place] Pia D. 10/04/2019 3:15 PM Medical Record [id number] Patient Account Number: [id number] Date of Birth/Sex: Treating RN: 1941/01/08 (79 y.o. M) Primary Care Provider: Hurshel Party Other Clinician: Referring Provider: Treating Provider/Extender:Stone III, Deno Etienne, [place] Weeks in Treatment: 31 Active Problems ICD-10 Evaluated Encounter Code Description Active Date Today Diagnosis E11.51 Type 2 diabetes mellitus with diabetic peripheral 02/28/2019 No Yes angiopathy without gangrene L97.211 Non-pressure chronic ulcer of right calf limited to 02/28/2019 No Yes breakdown of skin E11.42 Type 2 diabetes mellitus with diabetic polyneuropathy 02/28/2019 No Yes L97.221 Non-pressure chronic ulcer of left calf limited to 02/28/2019 No Yes breakdown of skin I87.333 Chronic venous hypertension (idiopathic) with ulcer 02/28/2019 No Yes and inflammation of bilateral lower extremity B35.3 Tinea pedis 09/05/2019 No Yes L97.521 Non-pressure chronic ulcer of other part of left foot 09/26/2019 No Yes limited to breakdown of  skin L97.511 Non-pressure chronic ulcer of  other part of right foot 09/26/2019 No Yes limited to breakdown of skin Inactive Problems Resolved Problems Electronic Signature(s) Signed: 10/04/2019 4:01:04 PM By: Worthy Keeler PA-C Entered By: Worthy Keeler on 10/04/2019 16:01:03 -------------------------------------------------------------------------------- Progress Note Details Patient Name: Date of Service: [name], STEFFLER 10/04/2019 3:15 PM Medical Record [id number] Patient Account Number: [id number] Date of Birth/Sex: Treating RN: 05-Dec-1940 (79 y.o. M) Primary Care Provider: Hurshel Party Other Clinician: Referring Provider: Treating Provider/Extender:Stone III, Deno Etienne, [place] Weeks in Treatment: 31 Subjective Chief Complaint Information obtained from Patient chronic venous hypertension with ulcer left leg 02/28/2019; patient returns to clinic with wounds on his bilateral lower legs History of Present Illness (HPI) The following HPI elements were documented for the patient's wound: Location: Patient presents with a wound to left lower leg. Quality: Patient reports No Pain. Duration: 2 months no cig or alcohol. spontaneous appearance in area of stasis dermamtitis. [name] on metformin only. chronic afib on Coumadin. diabetes and coag studies not good. hba1c 7.5. ivr 4.5. no pain or sxs of systemic disease. hx chf. no intermittent claudication 02/28/2019 Readmission This is a now a 79 year old man who was previously cared for in 2016 by Dr. Lindon Romp for wounds on his lower extremities. At that point he had venous reflux studies although I cannot seem to open these in [place] link. He had arterial studies showing an ABI of 1.11 on the right and 1.27 on the left his waveforms were triphasic bilaterally. He was discharged in stockings although I do not believe he is wearing these in some time. He tells me that about a month ago he noted openings of a large wound on the  posterior right calf and 2 smaller areas on the left lateral calf and a small area more recently on the left posterior calf. He has been dressing these with peroxide and triple antibiotic ointment. He is not wearing compression. Past medical history; type 2 diabetes with peripheral neuropathy, chronic venous insufficiency, hypertension, cardiomyopathy, chronic atrial fibrillation on Coumadin, prostate cancer, hyperlipidemia, gout, ABI in our clinic was 1.34 on the left and not obtainable on the right 6/9; this is a patient who has chronic venous insufficiency. He has a fairly substantial area on the right posterior calf, left lateral calf and a small area on the left posterior calf. On arrival last week he had very palpable popliteal and femoral pulses but nothing in his bilateral feet. Unfortunately we cannot get arterial studies until July 1 at Dr. Kennon Holter office. They live in [place]. We use silver alginate under Kerlix Coban 6/16; patient with chronic venous insufficiency with wounds on his bilateral lower extremities. When he came into our clinic he was discovered to have a complete absence of peripheral pedal pulses at either the dorsalis pedis or posterior tibial. He does have easily palpable femoral and popliteal pulses. He sees Dr. [name] tomorrow for noninvasive arterial tests. He may also require venous reflux evaluation although I do not view this as an urgent thing. We have been using silver alginate. His wound surfaces of cleaned up quite nicely 6/23; patient with chronic venous insufficiency with wounds on his bilateral lower extremities. His wounds all are somewhat better looking. He did go to Dr. Kennon Holter office but somehow ended up on the doctors schedule rather than being scheduled for noninvasive tests therefore his noninvasive tests are scheduled for July 1. We agree that he has venous insufficiency ulcers but I cannot feel any pulses in his lower extremities dictating the  need  for test. We are only using Kerlix and light Coban unfortunately this appears to be holding the edema 6/30; has his arterial studies tomorrow. We have been using Kerlix and light Coban will go to a more aggressive compression if the arterial studies will allow. We all agreed these are venous wounds however I cannot feel pulses at either the dorsalis pedis or posterior tibial bilaterally. His wounds generally look some better including left lateral and right posterior. 7/7-Patient returns at 1 week in Kerlix/Coban to both legs, with improvement, in the left lateral and right posterior lower leg wounds, ABI's are normal in both legs per vascular studies, TBI is also normal on both sides, we are using hydrofera blue to the wounds 7/14; patient's arterial studies from 2 weeks ago showed an ABI on the right at 1.03 with a TBI of 0.86. On the left the ABI was 1.06 with a TBI of 0.84. Notable for the fact that his arterial waveforms were monophasic in all of the lower extremity arteries suggesting some degree of arterial occlusive disease but in general this was felt to be fairly adequate for healing. His compression was increased from 2-3 layer which is appropriate. Dressing was changed to [facility] 7/21; patient's wounds are measuring smaller. The more substantial one on the right posterior calf, second 1 on the left lateral calf. Using [facility] on both wound areas 7/28; patient continues to make nice improvements. The area on the right posterior calf is smaller. Area on the left lateral calf also is smaller. We have been using Hydrofera Blue under compression. The patient will need compression stockings and we have measured him for these in the eventuality that these heal which really should not be too long from now 8/4-Patient continues to make improvement, the right posterior calf area smaller with rim of keratotic skin on one side, the left wound is definitely smaller and  improving. 8/11-Returns at 1 week, after being in 3 layer compression on both legs, both wounds appear to be improving, making good progress, patient is happy, pain is also less especially in the right leg wound 8/18; the area on the left anterior lower leg is healed. On the right posterior leg the wound remains although the dimensions are a lot better. 8/25; he arrives in clinic today with a large body of open wound on the left lateral calf. All of the 3 wounds in this area are in close juxtaposition to each other. The story is that we discharged him last week with no a wrap on the left leg. They went to [place] could not get in as they are only excepting phone orders or online orders for stockings hence they did not put any stocking on the left leg all week. They have something at home but the patient with that was either incapable or just did not put them on. Apparently these opened 1 morning after getting out of bed. The area on the right has no real change 9/1; patient has bilateral lower extremity wounds in the setting of severe chronic venous insufficiency and secondary lymphedema. He arrived last week with new areas on the left lateral lower leg after we did not wrap him and he did not use his stockings. Nevertheless the areas on the left look better today under compression. Posterior right calf does not really changed. We are using Hydrofera Blue on both areas under compression 9/15; bilateral lower extremity wounds in the setting of severe chronic venous insufficiency and secondary lymphedema. He has  20 to 30 mmHg below-knee compression stockings under the eventuality that these close over. We did get the left leg to close but he did not transition to a stocking and this reopened. There are 2 open areas on the left posterior lateral calf and one on the right. Both of these look satisfactory. Using [facility] 9/22; bilateral lower extremity wounds in the setting of chronic venous  insufficiency. 2 superficial areas on the left lateral calf. One on the right just above the Achilles area. We have good edema control we have been using Hydrofera Blue 9/29; the areas on the left lateral calf are healed. On the right just above the Achilles and tendon area things look a lot better small wounds one scabbed area. We have been using Hydrofera Blue. We can discharge him in his own stocking on the left still wrapping on the right. This is the second time we have healed the left leg but he did not put a stocking on last time. Hopefully this will maintain the edema from chronic venous disease with secondary lymphedema 10/6; he comes in today having a stocking on the left leg. They had trouble getting it on there is a lot of increase in swelling 2 small open areas one anteriorly and one on the medial calf. They report a lot of difficulty getting the stocking on. ooParadoxically the area on the right that we have been wrapping posteriorly is closed 10/13; he comes in today with wounds bilaterally including superficial areas on the left medial and left lateral calf. As well as the right posterior has reopened in the Achilles area superiorly. He still does not have his juxta lite stockings although truthfully we would not of been able to use them today anyway. Apparently have been ordered and paid for from [employer] although they have not been delivered 10/20; his area on the right is just the boat closed on the right posterior. Still has the area on the left lateral and a very tiny area on the left medial. He has his bilateral juxta lites although he is not ready for them this week. He tolerated the increase to 4 layer compression last week quite well 10/27; the area on the right posterior calf is once again closed. He has a superficial area on the left lateral calf that is still open. He has been using Hydrofera Blue and bilateral 4-layer compression. He can change to his own juxta lite  stocking on the right and we are instructing him today 11/3; the area on the right posterior calf reopened according to the patient and his wife after they took off the stocking when they got home last week. Apparently scabbed over there is now a fairly substantial wound which looks pretty much the same. Our intake nurse noted that they were using the juxta lite stockings appropriately. I was really hoping I might be able to close him out today. He has 1 very tiny remaining area on the left lateral lower leg. 11/10; right posterior calf wound measures smaller but is still open. We have been using Hydrofera Blue. On the left he has a small oval-shaped wound and he seems to have had another wound distally that is open and likely a blister. We are using Hydrofera Blue under compression 11/17; right posterior calf wound continues to get better. We have been using Hydrofera Blue. On the left lateral one of the wounds has closed still a small open area. We have been using Hydrofera Blue on this as  well. Both areas have been under 4-layer compression Arrives in clinic today with some swelling in the dorsal foot on the right some erythema of his forefoot and toes. Initially when I looked at this I almost thought this was a sunburn distal to a wrap injury. 12/1; right posterior calf wound debrided with a curette. We have been using Hydrofera Blue on the left anterior lateral he has an area across the mid tibia. Finally a small area on the left lateral lower calf. Finally he continues to have de-epithelialized areas on the dorsal aspect of his toes. Initially thought this might be a burn injury when I saw him 2 weeks ago. I now wonder about tinea. I have also reviewed his arterial studies which were really quite good in July/20 with normal TBI's and ABIs but monophasic waveforms 12/8; comes in today with worsening problems especially on the left leg where he now has a cluster of wounds in the left anterior  mid tibia. Very poor edema control. I reduced him to 3 layer from 4 layer compression last week because of some concern about blood flow to his toes however he does not have good edema control on the left leg. Right leg edema control looks satisfactory. ooOn the left he has a cluster of wounds anteriorly, small area on the left medial fifth met head and then the collection of areas on his toes which appear better ooOn the right he has the original area on the right posterior calf, a new area right medially. His formal arterial studies from mid July are noted below. He was evaluated by Dr.Berry ABI Findings: +---------+------------------+-----+----------+--------+ Right Rt Pressure (mmHg)IndexWaveform Comment  +---------+------------------+-----+----------+--------+ Brachial 176     +---------+------------------+-----+----------+--------+ ATA 176 0.99 monophasic  +---------+------------------+-----+----------+--------+ PTA 183 1.03 monophasic  +---------+------------------+-----+----------+--------+ PERO 172 0.97 monophasic  +---------+------------------+-----+----------+--------+ Great Toe153 0.86    +---------+------------------+-----+----------+--------+ +---------+------------------+-----+-----------+-------+ Left Lt Pressure (mmHg)IndexWaveform Comment +---------+------------------+-----+-----------+-------+ Brachial 178     +---------+------------------+-----+-----------+-------+ ATA 162 0.91 multiphasic  +---------+------------------+-----+-----------+-------+ PTA 188 1.06 multiphasic  +---------+------------------+-----+-----------+-------+ PERO 158 0.89 monophasic   +---------+------------------+-----+-----------+-------+ Great Toe150 0.84    +---------+------------------+-----+-----------+-------+ +-------+-----------+-----------+------------+------------+ ABI/TBIToday's ABIToday's TBIPrevious  ABIPrevious TBI +-------+-----------+-----------+------------+------------+ Right 1.03 0.86 1.11   +-------+-----------+-----------+------------+------------+ Left 1.06 0.84 1.27   +-------+-----------+-----------+------------+------------+ Tibial waveforms somewhat difficult to record due to irregular heartbeat. Bilateral ABIs appear essentially unchanged compared to prior study on 06/21/15. Summary: Right: Resting right ankle-brachial index is within normal range. No evidence of significant right lower extremity arterial disease. The right toe-brachial index is normal. Although ankle brachial indices are within normal limits (0.95-1.29), arterial Doppler waveforms at the ankle suggest some component of arterial occlusive disease. Left: Resting left ankle-brachial index is within normal range. No evidence of significant left lower extremity arterial disease. The left toe-brachial index is normal. Although ankle brachial indices are within normal limits (0.95-1.29), arterial Doppler waveforms at the ankle suggest some component of arterial occlusive disease. 12/15; the patient's area on the left mid tibia looks better. Right posterior calf also better. He has the area on the left foot as well. All of his toes look better I think this was tinea. We are using Hydrofera Blue everywhere else The patient was in urgent care yesterday with wheezing and shortness of breath. He got azithromycin and prednisone. He feels better. His lungs are currently clear to auscultation. He was not tested for Covid 19 12/29; the patient arrives in clinic today with quite a bit change. 2 weeks ago he only had areas on the left mid tibia right posterior calf  with tinea pedis resolving between his toes. He arrives in clinic today with several areas on the dorsal toes on the right, dorsal left second toe. He has skin breakdown in the left medial calf probably from excess edema. Small area proximally in the  medial calf. He has weeping edema fluid coming out of the skin excoriations on the left medial calf. He tells me that he is having a cardiac catheterization next week. I had a quick look at [facility] health link. He was found to have an ejection fraction of 25% during the work-up for persistent atrial fibrillation. He saw his cardiology office yesterday seen by the nurse practitioner. She increased his carvedilol. He has not been on diuretics for apparently several months and indeed in the nurse practitioner Dietrich Pates notes she had knowledge of this. 10/04/2019 on evaluation today patient presents as a walk-in visit concerning the fact that he did not have an appointment here for our clinic at this point. He actually had a cardiac catheterization earlier today and then came from there to here in order to be evaluated. With that being said unfortunately he is having significant cellulitis of his left lower extremity upon evaluation today this appears to have deteriorated even since last week's evaluation with Dr. Dellia Nims. The right lower extremity is actually doing okay I really see no evidence of deterioration at this point at those locations. In fact the right leg seems in general be doing quite well. Nonetheless I am concerned about infection and cellulitis of the left lower extremity and again considering his weakened heart I do not want him to develop into sepsis at all. He is also having some trouble breathing today and I understand according to nursing staff this is always the case to some degree. With that being said the patient unfortunately seems to be in my opinion a little bit worse even his wife feels like that may be the case today. Unsure exactly what is leading to this. Cardiac catheterization I did review the report which showed an ejection fraction of 25% he also had an LAD blockage of around 25% based on what I saw. With that being said there was no significant blockages that  required stenting at this point he does have weakened cardiac muscles compared to normal. Objective Constitutional Well-nourished and well-hydrated in no acute distress. Vitals Time Taken: 3:55 PM, Height: 74 in, Weight: 212 lbs, BMI: 27.2, Temperature: 97.8 F, Pulse: 102 bpm, Respiratory Rate: 26 breaths/min, Blood Pressure: 132/78 mmHg, Pulse Oximetry: 100 %. General Notes: rechecked heart rate manually 88 and Resp Rate rechecked 26 after 10 minutes of sitting in chair. patient is using accessory muscles, pale, and does not overall feel well. MD made aware. Respiratory normal breathing without difficulty. Psychiatric this patient is able to make decisions and demonstrates good insight into disease process. Alert and Oriented x 3. pleasant and cooperative. General Notes: Upon inspection today patient's right lower extremity appears to be doing quite well the wound itself is doing good in my opinion. We typically wrap this leg and it stays in place for a week at a time. With that being said the left lower extremity is unfortunately doing significantly worse. He has severe cellulitis even compared to last week and again I think that oral antibiotic may not even be the best way to go I am not sure that it would be sufficient and strong enough to manage what needs to be managed at this point. I am concerned about the potential  for developing into sepsis. Integumentary (Hair, Skin) Wound #13 status is Open. Original cause of wound was Gradually Appeared. The wound is located on the Left Toe Second. The wound measures 1.5cm length x 1.3cm width x 0.1cm depth; 1.532cm^2 area and 0.153cm^3 volume. There is Fat Layer (Subcutaneous Tissue) Exposed exposed. There is no tunneling or undermining noted. There is a large amount of serosanguineous drainage noted. The wound margin is distinct with the outline attached to the wound base. There is no granulation within the wound bed. There is a large  (67-100%) amount of necrotic tissue within the wound bed including Adherent Slough. General Notes: macerated, edematous, and redness. Wound #14 status is Healed - Epithelialized. Original cause of wound was Gradually Appeared. The wound is located on the Left Toe Third. The wound measures 0cm length x 0cm width x 0cm depth; 0cm^2 area and 0cm^3 volume. Wound #15 status is Converted. Original cause of wound was Blister. The wound is located on the Left,Anterior Lower Leg. Wound #16 status is Converted. Original cause of wound was Blister. The wound is located on the [facility] Lower Leg. Wound #17 status is Healed - Epithelialized. Original cause of wound was Blister. The wound is located on the Right,Medial Lower Leg. The wound measures 0cm length x 0cm width x 0cm depth; 0cm^2 area and 0cm^3 volume. Wound #18 status is Open. Original cause of wound was Gradually Appeared. The wound is located on the Left,Medial Foot. The wound measures 0.3cm length x 0.3cm width x 0.1cm depth; 0.071cm^2 area and 0.007cm^3 volume. There is no tunneling or undermining noted. There is a none present amount of drainage noted. The wound margin is distinct with the outline attached to the wound base. There is no granulation within the wound bed. There is a large (67-100%) amount of necrotic tissue within the wound bed including Adherent Slough. Wound #19 status is Open. Original cause of wound was Gradually Appeared. The wound is located on the Left,Circumferential Lower Leg. The wound measures 26cm length x 24cm width x 0.1cm depth; 490.088cm^2 area and 49.009cm^3 volume. There is Fat Layer (Subcutaneous Tissue) Exposed exposed. There is no tunneling or undermining noted. There is a large amount of purulent drainage noted. The wound margin is distinct with the outline attached to the wound base. There is medium (34-66%) red, pink granulation within the wound bed. There is a medium (34-66%) amount of  necrotic tissue within the wound bed including Adherent Slough. General Notes: redness, tender to touch, and edema noted. #17,91,50 all converted to wound number 19. Wound #20 status is Open. Original cause of wound was Gradually Appeared. The wound is located on the Right Toe Great. The wound measures 1.4cm length x 0.5cm width x 0.2cm depth; 0.55cm^2 area and 0.11cm^3 volume. There is no tunneling or undermining noted. There is a small amount of serous drainage noted. The wound margin is distinct with the outline attached to the wound base. There is no granulation within the wound bed. There is a large (67-100%) amount of necrotic tissue within the wound bed including Adherent Slough. Wound #21 status is Open. Original cause of wound was Gradually Appeared. The wound is located on the Right Toe Second. The wound measures 1.2cm length x 0.8cm width x 0.1cm depth; 0.754cm^2 area and 0.075cm^3 volume. There is no tunneling or undermining noted. There is a small amount of serous drainage noted. The wound margin is distinct with the outline attached to the wound base. There is no granulation within the wound bed.  There is a large (67-100%) amount of necrotic tissue within the wound bed including Adherent Slough. Wound #22 status is Open. Original cause of wound was Gradually Appeared. The wound is located on the Right Toe Third. The wound measures 1.1cm length x 0.9cm width x 0.1cm depth; 0.778cm^2 area and 0.078cm^3 volume. There is Fat Layer (Subcutaneous Tissue) Exposed exposed. There is no tunneling or undermining noted. There is a small amount of serous drainage noted. The wound margin is distinct with the outline attached to the wound base. There is no granulation within the wound bed. There is a large (67-100%) amount of necrotic tissue within the wound bed including Adherent Slough. Wound #23 status is Converted. Original cause of wound was Gradually Appeared. The wound is located on  the Left,Proximal,Medial Lower Leg. Wound #3R status is Open. Original cause of wound was Gradually Appeared. The wound is located on the Right,Posterior Calf. The wound measures 2cm length x 1.5cm width x 0.1cm depth; 2.356cm^2 area and 0.236cm^3 volume. There is Fat Layer (Subcutaneous Tissue) Exposed exposed. There is no tunneling or undermining noted. There is a medium amount of serous drainage noted. The wound margin is distinct with the outline attached to the wound base. There is medium (34-66%) pink granulation within the wound bed. There is a medium (34-66%) amount of necrotic tissue within the wound bed including Adherent Slough. 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 Non-pressure chronic ulcer of left calf limited to breakdown of skin Chronic venous hypertension (idiopathic) with ulcer and inflammation of bilateral lower extremity Tinea pedis Non-pressure chronic ulcer of other part of left foot limited to breakdown of skin Non-pressure chronic ulcer of other part of right foot limited to breakdown of skin Procedures Wound #3R Pre-procedure diagnosis of Wound #3R is a Diabetic Wound/Ulcer of the Lower Extremity located on the Right,Posterior Calf . There was a Haematologist Compression Therapy Procedure by [name], RN. Post procedure Diagnosis Wound #3R: Same as Pre-Procedure Plan Follow-up Appointments: Return Appointment in 1 week. - or call to schedule appointment after discharge from hospital Dressing Change Frequency: Wound #13 Left Toe Second: Change dressing every day. Wound #18 Left,Medial Foot: Do not change entire dressing for one week. Wound #3R Right,Posterior Calf: Do not change entire dressing for one week. Skin Barriers/Peri-Wound Care: Wound #18 Left,Medial  Foot: TCA Cream or Ointment Wound Cleansing: May shower with protection. Primary Wound Dressing: Wound #13 Left Toe Second: Calcium Alginate with Silver Wound #18 Left,Medial Foot: Hydrofera Blue Wound #19 Left,Circumferential Lower Leg: Other: - wrap with gauze and ABD and kerlix Wound #20 Right Toe Great: Hydrofera Blue Wound #21 Right Toe Second: Hydrofera Blue Wound #22 Right Toe Third: Hydrofera Blue Wound #3R Right,Posterior Calf: Iodoflex Secondary Dressing: Wound #13 Left Toe Second: Dry Gauze Wound #18 Left,Medial Foot: Dry Gauze Wound #20 Right Toe Great: Dry Gauze Wound #21 Right Toe Second: Dry Gauze Wound #22 Right Toe Third: Dry Gauze Wound #3R Right,Posterior Calf: Dry Gauze Edema Control: Wound #3R Right,Posterior Calf: Unna Boot to Right Lower Extremity Off-Loading: Open toe surgical shoe to: - left and right foot Additional Orders / Instructions: Other: - Go to emergency room for evaluation of left lower leg cellulitis 1. At this point with regard to the right lower extremity I would recommend that we go ahead and put his compression wrap on. This really  does not have to be removed at the ER unless they absolutely feel that is necessary I will leave that up to them however we normally wrap this and keep in place for a week at a time in that leg seems to be doing excellent. 2. With regard to the left lower extremity this does appear to exhibit signs of severe cellulitis at this time and I am concerned about the infection spreading and potentially developing into sepsis. Obviously that can be a big deal and this was discussed with the patient and his wife. Subsequently after extensive discussion and the office today we did decide that the ideal thing would be to have the patient go to the ER for further evaluation and treatment specifically with regard to obtaining IV antibiotics. I think that is definitely the best course. I therefore given him a copy  of this note as well to take with him to the ER today. 3. With regard to his breathing again he does seem to be more labored in regard to his breathing using a sensory muscles as well at this point. Again I think that he is going to need to follow-up at the ER for further evaluation with regard to the wound and again obviously they can evaluate his respiratory status as well. Again his wife tells me he does seem to be a little bit more short of breath right now using more sensory muscles even the normal and the nursing staff felt that was true with him as well. We will see the patient back for a follow-up visit once he is discharged from the [facility] Signature(s) Signed: 10/04/2019 5:14:33 PM By: Worthy Keeler PA-C Previous Signature: 10/04/2019 5:13:40 PM Version By: Worthy Keeler PA-C Entered By: Worthy Keeler on 10/04/2019 17:14:33 -------------------------------------------------------------------------------- SuperBill Details Patient Name: Date of Service: Daniel Pia D. 10/04/2019 Medical Record [id number] Patient Account Number: [id number] Date of Birth/Sex: Treating RN: 20-Dec-1940 (78 y.o. 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 L97.221 Non-pressure chronic ulcer of left calf limited to breakdown of skin I87.333 Chronic venous hypertension (idiopathic) with ulcer and inflammation of bilateral lower extremity B35.3 Tinea pedis L97.521 Non-pressure chronic ulcer of other part of left foot limited to breakdown of skin L97.511 Non-pressure chronic ulcer of  other part of right foot limited to breakdown of skin Facility Procedures The patient participates with Medicare or their insurance follows the Medicare Facility Guidelines: CPT4 Code Description Modifier Quantity [phone] (Facility Use Only) [phone] - Channing Mutters RT 1 Physician Procedures CPT4 Code Description: [id number] - WC PHYS LEVEL 5 - EST PT ICD-10 Diagnosis Description E11.51 Type 2 diabetes mellitus with diabetic peripheral angi L97.211 Non-pressure chronic ulcer of right calf limited to br E11.42 Type 2 diabetes mellitus with  diabetic polyneuropathy L97.221 Non-pressure chronic ulcer of left calf limited to bre Modifier: opathy without g eakdown of skin akdown of skin Quantity: 1 angrene Electronic Signature(s) Signed: 10/04/2019 5:14:00 PM By: Worthy Keeler PA-C Entered By: Worthy Keeler on 10/04/2019 17:13:58

## 2019-10-04 NOTE — ED Notes (Signed)
Pt stated he did not want to wait. Wife is picking him up

## 2019-10-04 NOTE — ED Triage Notes (Signed)
Pt coming in c/o bilateral leg pain that has been ongoing. Seen at wound center today and told to come in for worsening left leg condition. Was told right foot leg was looking better.

## 2019-10-04 NOTE — Progress Notes (Addendum)
DEMARIAN, Gross ([id number]) Visit Report for 10/04/2019 Arrival Information Details Patient Name: Date of Service: [name] 10/04/2019 3:15 PM Medical Record [place] Patient Account Number: [id number] Date of Birth/Sex: Treating RN: 05-31-1941 (79 y.o. [name] Primary Care Nadirah Socorro: [place] Party Other Clinician: Referring Alayne Estrella: Treating Lundon Rosier/Extender:Stone III, Deno Etienne, [place] Weeks in Treatment: 31 Visit Information History Since Last Visit All ordered tests and consults were completed: Yes Patient Arrived: Wheel Chair Added or deleted any medications: No Arrival Time: 15:50 Any new allergies or adverse reactions: No Accompanied By: wife Had a fall or experienced change in No Transfer Assistance: Manual activities of daily living that may affect Patient Identification Verified: Yes risk of falls: Secondary Verification Process Yes Signs or symptoms of abuse/neglect since last No Completed: visito Patient Requires Transmission- No Hospitalized since last visit: No Based Precautions: Implantable device outside of the clinic excluding No Patient Has Alerts: Yes cellular tissue based products placed in the center Patient Alerts: Patient on Blood since last visit: Thinner Has Dressing in Place as Prescribed: Yes Has Compression in Place as Prescribed: Yes Pain Present Now: Yes Notes patient had a heart cath performed today. Electronic Signature(s) Signed: 10/04/2019 5:41:11 PM By: Deon Pilling Entered By: Deon Pilling on 10/04/2019 16:17:19 -------------------------------------------------------------------------------- Compression Therapy Details Patient Name: Date of Service: [name], [name] 10/04/2019 3:15 PM Medical Record [id number] Patient Account Number: [id number] Date of Birth/Sex: Treating RN: 09/13/1941 (79 y.o. [name] Primary Care Genova Kiner: H[place]hel Party Other Clinician: Referring Jenefer Woerner:  Treating Maureena Dabbs/Extender:Stone III, Deno Etienne, [place] Weeks in Treatment: 31 Compression Therapy Performed for Wound Wound #3R Right,Posterior Calf Assessment: Performed By: Jake Church, RN Compression Type: Rolena Infante Post Procedure Diagnosis Same as Pre-procedure Electronic Signature(s) Signed: 10/04/2019 6:06:10 PM By: Baruch Gouty RN, BSN Entered By: Baruch Gouty on 10/04/2019 17:00:27 -------------------------------------------------------------------------------- Lower Extremity Assessment Details Patient Name: Date of Service: [name] 10/04/2019 3:15 PM Medical Record [id number] Patient Account Number: [id number] Date of Birth/Sex: Treating RN: April 08, 1941 (79 y.o. [name] Primary Care May Manrique: [place] Party Other Clinician: Referring Graceanne Guin: Treating Price Lachapelle/Extender:Stone III, Deno Etienne, [place] Weeks in Treatment: 31 Edema Assessment Assessed: [Left: Yes] [Right: Yes] Edema: [Left: Yes] [Right: Yes] Calf Left: Right: Point of Measurement: 31 cm From Medial Instep 34 cm 31 cm Ankle Left: Right: Point of Measurement: 11 cm From Medial Instep 24 cm 21 cm Electronic Signature(s) Signed: 10/04/2019 5:41:11 PM By: Deon Pilling Entered By: Deon Pilling on 10/04/2019 16:18:46 -------------------------------------------------------------------------------- Multi-Disciplinary Care Plan Details Patient Name: Date of Service: Daniel Pia D. 10/04/2019 3:15 PM Medical Record [id number] Patient Account Number: [id number] Date of Birth/Sex: Treating RN: [birth year] (79 y.o. [name] Primary Care Eliyah Bazzi: [place] Party Other Clinician: Referring Analia Zuk: Treating Elisabeth Strom/Extender:Stone III, Deno Etienne, [place] Weeks in Treatment: 6 Active Inactive Venous Leg Ulcer Nursing Diagnoses: Knowledge deficit related to disease process and management Potential for venous Insuffiency (use before diagnosis  confirmed) Goals: Patient will maintain optimal edema control Date Initiated: 10/04/2019 Target Resolution Date: 11/01/2019 Goal Status: Active Interventions: Assess peripheral edema status every visit. Compression as ordered Treatment Activities: Therapeutic compression applied : 10/04/2019 Notes: Wound/Skin Impairment Nursing Diagnoses: Knowledge deficit related to ulceration/compromised skin integrity Goals: Patient/caregiver will verbalize understanding of skin care regimen Date Initiated: 02/28/2019 Target Resolution Date: 11/01/2019 Goal Status: Active Ulcer/skin breakdown will have a volume reduction of 30% by week 4 Date Initiated: 02/28/2019 Date Inactivated: 04/04/2019 Target Resolution Date: 03/31/2019 Goal Status: Met Ulcer/skin breakdown will have a volume reduction of  50% by week 8 Date Initiated: 04/04/2019 Date Inactivated: 05/09/2019 Target Resolution Date: 05/05/2019 Goal Status: Met Ulcer/skin breakdown will have a volume reduction of 80% by week 12 Date Initiated: 05/09/2019 Date Inactivated: 06/13/2019 Target Resolution Date: 06/09/2019 Unmet Goal Status: Unmet Reason: comorbities/new wounds Ulcer/skin breakdown will heal within 14 weeks Date Initiated: 06/13/2019 Date Inactivated: 07/11/2019 Target Resolution Date: 07/07/2019 Unmet Goal Status: Unmet Reason: comorbityies Interventions: Assess patient/caregiver ability to obtain necessary supplies Assess patient/caregiver ability to perform ulcer/skin care regimen upon admission and as needed Assess ulceration(s) every visit Notes: Electronic Signature(s) Signed: 10/04/2019 6:06:10 PM By: Baruch Gouty RN, BSN Entered By: Baruch Gouty on 10/04/2019 16:59:07 -------------------------------------------------------------------------------- Pain Assessment Details Patient Name: Date of Service: Daniel Pia D. 10/04/2019 3:15 PM Medical Record [id number] Patient Account Number: [id number] Date of  Birth/Sex: Treating RN: [birth year] (79 y.o. [name] Primary Care Valetta Mulroy: [place]hel Party Other Clinician: Referring Bryston Colocho: Treating Elva Breaker/Extender:Stone III, Deno Etienne, [place] Weeks in Treatment: 30 Active Problems Location of Pain Severity and Description of Pain Patient Has Paino Yes Site Locations Pain Location: Generalized Pain, Pain in Ulcers Rate the pain. Current Pain Level: 7 Worst Pain Level: 10 Least Pain Level: 0 Tolerable Pain Level: 8 Pain Management and Medication Current Pain Management: Medication: No Cold Application: No Rest: No Massage: No Activity: No T.E.N.S.: No Heat Application: No Leg drop or elevation: No Is the Current Pain Management Adequate: Adequate How does your wound impact your activities of daily livingo Sleep: No Bathing: No Appetite: No Relationship With Others: No Bladder Continence: No Emotions: No Bowel Continence: No Work: No Toileting: No Drive: No Dressing: No Hobbies: No Electronic Signature(s) Signed: 10/04/2019 5:41:11 PM By: Deon Pilling Entered By: Deon Pilling on 10/04/2019 16:18:30 -------------------------------------------------------------------------------- Patient/Caregiver Education Details Patient Name: Date of Service: Armando Gang 1/6/2021andnbsp3:15 PM Medical Record [phone] [id number] Patient Account Number: [id number] Date of Birth/Gender: Treating RN: [birth year] (79 y.o. [name] Primary Care Physician: Hurshel Party Other Clinician: Referring Physician: Treating Physician/Extender:Stone III, Deno Etienne, [place] Weeks in Treatment: 67 Education Assessment Education Provided To: Patient Education Topics Provided Venous: Methods: Explain/Verbal Responses: Reinforcements needed, State content correctly Wound/Skin Impairment: Methods: Explain/Verbal Responses: Reinforcements needed, State content correctly Electronic Signature(s) Signed: 10/04/2019 6:06:10 PM  By: Baruch Gouty RN, BSN Entered By: Baruch Gouty on 10/04/2019 16:59:30 -------------------------------------------------------------------------------- Wound Assessment Details Patient Name: Date of Service: Daniel Pia D. 10/04/2019 3:15 PM Medical Record [id number] Patient Account Number: [id number] Date of Birth/Sex: Treating RN: [birth year] (79 y.o. [name] Primary Care Adda Stokes: [place] Party Other Clinician: Referring Alysse Rathe: Treating Hamp Moreland/Extender:Stone III, Deno Etienne, [place] Weeks in Treatment: 31 Wound Status Wound Number: 13 Primary Diabetic Wound/Ulcer of the Lower Extremity Etiology: Wound Location: Left Toe Second Wound Open Wounding Event: Gradually Appeared Status: Date Acquired: 08/22/2019 Comorbid Cataracts, Hypertension, Peripheral Venous Weeks Of Treatment: 6 History: Disease, Type II Diabetes, Gout, Received Clustered Wound: No Radiation Photos Wound Measurements Length: (cm) 1.5 Width: (cm) 1.3 Depth: (cm) 0.1 Area: (cm) 1.532 Volume: (cm) 0.153 Wound Description Classification: Grade 1 Wound Margin: Distinct, outline attached Exudate Amount: Large Exudate Type: Serosanguineous Exudate Color: red, brown Wound Bed Granulation Amount: None Present (0%) Necrotic Amount: Large (67-100%) Necrotic Quality: Adherent Slough After Cleansing: No brino Yes Exposed Structure posed: No (Subcutaneous Tissue) Exposed: Yes posed: No posed: No osed: No sed: No % Reduction in Area: 50.2% % Reduction in Volume: 50.3% Epithelialization: None Tunneling: No Undermining: No Foul Odor Slough/Fi Fascia Ex Fat Layer Tendon Ex Muscle Ex Joint Exp Bone Expo  Assessment Notes macerated, edematous, and redness. Electronic Signature(s) Signed: 10/06/2019 3:20:21 PM By: Mikeal Hawthorne EMT/HBOT Signed: 10/16/2019 5:45:39 PM By: Deon Pilling Previous Signature: 10/04/2019 5:41:11 PM Version By: Deon Pilling Entered By: Mikeal Hawthorne on 10/06/2019 11:23:07 -------------------------------------------------------------------------------- Wound Assessment Details Patient Name: Date of Service: CHRISTEN, BEDOYA 10/04/2019 3:15 PM Medical Record [id number] Patient Account Number: [id number] Date of Birth/Sex: Treating RN: May 15, 1941 (79 y.o. [name] Primary Care Pau Banh: H[place]hel Party Other Clinician: Referring Alok Minshall: Treating Victoriano Campion/Extender:Stone III, Deno Etienne, [place] Weeks in Treatment: 31 Wound Status Wound Number: 14 Primary Diabetic Wound/Ulcer of the Lower Extremity Etiology: Wound Location: Left Toe Third Wound Healed - Epithelialized Wounding Event: Gradually Appeared Status: Date Acquired: 08/22/2019 Comorbid Cataracts, Hypertension, Peripheral Venous Weeks Of Treatment: 6 History: Disease, Type II Diabetes, Gout, Received Clustered Wound: No Radiation Photos Wound Measurements Length: (cm) 0 % Reductio Width: (cm) 0 % Reductio Depth: (cm) 0 Epithelial Area: (cm) 0 Volume: (cm) 0 Wound Description Classification: Grade 1 Wound Margin: Distinct, outline attached Exudate Amount: None Present Wound Bed Granulation Amount: None Present (0%) Necrotic Amount: None Present (0%) Foul Odor After Cleansing: No Exposed Structure Fascia Exposed: No Fat Layer (Subcutaneous Tissue) Exposed: No Tendon Exposed: No Muscle Exposed: No Joint Exposed: No Bone Exposed: No n in Area: 100% n in Volume: 100% ization: Large (67-100%) Electronic Signature(s) Signed: 10/06/2019 3:20:21 PM By: Mikeal Hawthorne EMT/HBOT Signed: 10/16/2019 5:45:39 PM By: Deon Pilling Previous Signature: 10/04/2019 5:41:11 PM Version By: Deon Pilling Entered By: Mikeal Hawthorne on 10/06/2019 11:23:35 -------------------------------------------------------------------------------- Wound Assessment Details Patient Name: Date of Service: Daniel Pia D. 10/04/2019 3:15 PM Medical Record  [id number] Patient Account Number: [id number] Date of Birth/Sex: Treating RN: 04-Feb-1941 (79 y.o. [name] Primary Care Anquan Azzarello: [place] Party Other Clinician: Referring Colston Pyle: Treating Kaycen Whitworth/Extender:Stone III, Deno Etienne, [place] Weeks in Treatment: 31 Wound Status Wound Number: 15 Primary Diabetic Wound/Ulcer of the Lower Etiology: Extremity Wound Location: Left, Anterior Lower Leg Wound Status: Converted Wounding Event: Blister Date Acquired: 08/29/2019 Weeks Of Treatment: 5 Clustered Wound: Yes Wound Description Classification: Grade 2 Electronic Signature(s) Signed: 10/04/2019 5:41:11 PM By: Deon Pilling Entered By: Deon Pilling on 10/04/2019 16:09:11 -------------------------------------------------------------------------------- Wound Assessment Details Patient Name: Date of Service: [name], TRULL 10/04/2019 3:15 PM Medical Record [id number] Patient Account Number: [id number] Date of Birth/Sex: Treating RN: 14-Feb-1941 (79 y.o. [name] Primary Care Scotti Motter: [place] Party Other Clinician: Referring Dalal Livengood: Treating Deavin Forst/Extender:Stone III, Deno Etienne, [place] Weeks in Treatment: 31 Wound Status Wound Number: 16 Primary Etiology: Venous Leg Ulcer Wound Location: Left, Distal, Anterior Lower Leg Wound Status: Converted Wounding Event: Blister Date Acquired: 09/05/2019 Weeks Of Treatment: 4 Clustered Wound: Yes Wound Description Classification: Full Thickness Without Exposed Support Structures Electronic Signature(s) Signed: 10/04/2019 5:41:11 PM By: Deon Pilling Entered By: Deon Pilling on 10/04/2019 16:09:12 -------------------------------------------------------------------------------- Wound Assessment Details Patient Name: Date of Service: BURECH, MCFARLAND 10/04/2019 3:15 PM Medical Record [id number] Patient Account Number: [id number] Date of Birth/Sex: Treating RN: 1941/07/15 (79 y.o. [name] Primary Care Tedra Coppernoll: [place]hel Party Other Clinician: Referring Tyrika Newman: Treating Osiel Stick/Extender:Stone III, Deno Etienne, [place] Weeks in Treatment: 31 Wound Status Wound Number: 17 Primary Venous Leg Ulcer Etiology: Wound Location: Right Lower Leg - Medial Wound Healed - Epithelialized Wounding Event: Blister Status: Date Acquired: 09/05/2019 Comorbid Cataracts, Hypertension, Peripheral Venous Weeks Of Treatment: 4 History: Disease, Type II Diabetes, Gout, Received Clustered Wound: No Radiation Photos Wound Measurements Length: (cm) 0 % Reduct Width: (cm) 0 % Reduct Depth: (cm) 0 Epitheli  Area: (cm) 0 Volume: (cm) 0 Wound Description Classification: Full Thickness Without Exposed Support Foul Odo Structures Slough/F Wound Distinct, outline attached Margin: Exudate None Present Amount: Wound Bed Granulation Amount: None Present (0%) Necrotic Amount: None Present (0%) Fascia E Fat Laye Tendon E Muscle E Joint Ex Bone Exp r After Cleansing: No ibrino No Exposed Structure xposed: No r (Subcutaneous Tissue) Exposed: No xposed: No xposed: No posed: No osed: No ion in Area: 100% ion in Volume: 100% alization: Large (67-100%) Electronic Signature(s) Signed: 10/05/2019 4:21:35 PM By: Mikeal Hawthorne EMT/HBOT Signed: 10/05/2019 5:19:45 PM By: Deon Pilling Previous Signature: 10/04/2019 5:41:11 PM Version By: Deon Pilling Entered By: Mikeal Hawthorne on 10/05/2019 13:52:21 -------------------------------------------------------------------------------- Wound Assessment Details Patient Name: Date of Service: Daniel Pia D. 10/04/2019 3:15 PM Medical Record [id number] Patient Account Number: [id number] Date of Birth/Sex: Treating RN: 09/06/1941 (79 y.o. [name] Primary Care Jc Veron: H[place]hel Party Other Clinician: Referring Jorden Mahl: Treating Makael Stein/Extender:Stone III, Deno Etienne, [place] Weeks in Treatment: 31 Wound Status Wound  Number: 18 Primary Diabetic Wound/Ulcer of the Lower Extremity Etiology: Wound Location: Left Foot - Medial Wound Open Wounding Event: Gradually Appeared Status: Date Acquired: 09/03/2019 Comorbid Cataracts, Hypertension, Peripheral Venous Weeks Of Treatment: 4 History: Disease, Type II Diabetes, Gout, Received Clustered Wound: No Radiation Photos Wound Measurements Length: (cm) 0.3 % Reductio Width: (cm) 0.3 % Reductio Depth: (cm) 0.1 Epithelial Area: (cm) 0.071 Tunneling Volume: (cm) 0.007 Undermini Wound Description Classification: Grade 2 Wound Margin: Distinct, outline attached Exudate Amount: None Present Wound Bed Granulation Amount: None Present (0%) Necrotic Amount: Large (67-100%) Necrotic Quality: Adherent Slough Foul Odor After Cleansing: No Slough/Fibrino Yes Exposed Structure Fascia Exposed: No Fat Layer (Subcutaneous Tissue) Exposed: No Tendon Exposed: No Muscle Exposed: No Joint Exposed: No Bone Exposed: No n in Area: 93% n in Volume: 93.1% ization: None : No ng: No Electronic Signature(s) Signed: 10/06/2019 3:20:21 PM By: Mikeal Hawthorne EMT/HBOT Signed: 10/16/2019 5:45:39 PM By: Deon Pilling Previous Signature: 10/04/2019 5:41:11 PM Version By: Deon Pilling Previous Signature: 10/04/2019 5:41:11 PM Version By: Deon Pilling Entered By: Mikeal Hawthorne on 10/06/2019 11:24:00 -------------------------------------------------------------------------------- Wound Assessment Details Patient Name: Date of Service: Daniel Pia D. 10/04/2019 3:15 PM Medical Record [id number] Patient Account Number: [id number] Date of Birth/Sex: Treating RN: 1941/08/02 (79 y.o. [name] Primary Care Macall Mccroskey: [place]hel Party Other Clinician: Referring Meia Emley: Treating Tasha Diaz/Extender:Stone III, Deno Etienne, [place] Weeks in Treatment: 31 Wound Status Wound Number: 19 Primary Venous Leg Ulcer Etiology: Wound Location: Left Lower Leg -  Circumferential Wound Open Wounding Event: Gradually Appeared Status: Date Acquired: 09/23/2019 Comorbid Cataracts, Hypertension, Peripheral Venous Weeks Of Treatment: 1 History: Disease, Type II Diabetes, Gout, Received Clustered Wound: Yes Radiation Photos Wound Measurements Length: (cm) 26 % Reduct Width: (cm) 24 % Reduct Depth: (cm) 0.1 Epitheli Clustered Quantity: 15 Tunnelin Area: (cm) 490.088 Undermi Volume: (cm) 49.009 Wound Description Full Thickness Without Exposed Support Foul Odo Classification: Structures Slough/F Wound Distinct, outline attached Margin: Exudate Large Amount: Exudate Purulent Type: Exudate yellow, brown, green Color: Wound Bed Granulation Amount: Medium (34-66%) Granulation Quality: Red, Pink Fascia Ex Necrotic Amount: Medium (34-66%) Fat Layer Necrotic Quality: Adherent Slough Tendon Ex Muscle Ex Joint Exp Bone Expo r After Cleansing: No ibrino Yes Exposed Structure posed: No (Subcutaneous Tissue) Exposed: Yes posed: No posed: No osed: No sed: No ion in Area: -814.3% ion in Volume: -814.3% alization: None g: No ning: No Assessment Notes redness, tender to touch, and edema noted. #11,94,17 all converted to wound number 19. Electronic Signature(s)  Signed: 10/05/2019 4:21:35 PM By: Mikeal Hawthorne EMT/HBOT Signed: 10/05/2019 5:19:45 PM By: Deon Pilling Previous Signature: 10/04/2019 5:41:11 PM Version By: Deon Pilling Entered By: Mikeal Hawthorne on 10/05/2019 13:49:23 -------------------------------------------------------------------------------- Wound Assessment Details Patient Name: Date of Service: [name], BRILL. 10/04/2019 3:15 PM Medical Record [id number] Patient Account Number: [id number] Date of Birth/Sex: Treating RN: 16-Jan-1941 (79 y.o. [name] Primary Care Jolynne Spurgin: [place] Party Other Clinician: Referring Laiah Pouncey: Treating Lenola Lockner/Extender:Stone III, Deno Etienne, [place] Weeks in Treatment:  31 Wound Status Wound Number: 20 Primary Diabetic Wound/Ulcer of the Lower Extremity Etiology: Wound Location: Right Toe Great Wound Open Wounding Event: Gradually Appeared Status: Date Acquired: 09/23/2019 Comorbid Cataracts, Hypertension, Peripheral Venous Weeks Of Treatment: 1 History: Disease, Type II Diabetes, Gout, Received Clustered Wound: No Radiation Photos Wound Measurements Length: (cm) 1.4 Width: (cm) 0.5 Depth: (cm) 0.2 Area: (cm) 0.55 Volume: (cm) 0.11 Wound Description Classification: Grade 2 Wound Margin: Distinct, outline attached Exudate Amount: Small Exudate Type: Serous Exudate Color: amber Wound Bed Granulation Amount: None Present (0%) Necrotic Amount: Large (67-100%) Necrotic Quality: Adherent Slough Foul Odor After Cleansing: No Slough/Fibrino Yes Exposed Structure Fascia Exposed: Fat Layer (Subcutaneous Tissue) Exposed: Tendon Exposed: Muscle Exposed: Joint Exposed: Bone Exposed: % Reduction in Area: -7.6% % Reduction in Volume: -115.7% Epithelialization: None Tunneling: No Undermining: _0  No No Electronic Signature(s) Signed: 10/05/2019 4:21:35 PM By: Mikeal Hawthorne EMT/HBOT Signed: 10/05/2019 5:19:45 PM By: Deon Pilling Previous Signature: 10/04/2019 5:41:11 PM Version By: Deon Pilling Entered By: Mikeal Hawthorne on 10/05/2019 14:01:23 -------------------------------------------------------------------------------- Wound Assessment Details Patient Name: Date of Service: Daniel Pia D. 10/04/2019 3:15 PM Medical Record [id number] Patient Account Number: [id number] Date of Birth/Sex: Treating RN: Apr 29, 1941 (79 y.o. [name] Primary Care Joss Mcdill: [place] Party Other Clinician: Referring Joyia Riehle: Treating Laden Fieldhouse/Extender:Stone III, Deno Etienne, [place] Weeks in Treatment: 31 Wound Status Wound Number: 21 Primary Diabetic Wound/Ulcer of the Lower Extremity Etiology: Wound Location: Right Toe  Second Wound Open Wounding Event: Gradually Appeared Status: Date Acquired: 09/23/2019 Comorbid Cataracts, Hypertension, Peripheral Venous Weeks Of Treatment: 1 History: Disease, Type II Diabetes, Gout, Received Clustered Wound: No Radiation Photos Wound Measurements Length: (cm) 1.2 % Reductio Width: (cm) 0.8 % Reductio Depth: (cm) 0.1 Epithelial Area: (cm) 0.754 Tunneling Volume: (cm) 0.075 Undermini Wound Description Classification: Grade 2 Wound Margin: Distinct, outline attached Exudate Amount: Small Exudate Type: Serous Exudate Color: amber Wound Bed Granulation Amount: None Present (0%) Necrotic Amount: Large (67-100%) Necrotic Quality: Adherent Slough Foul Odor After Cleansing: No Slough/Fibrino Yes Exposed Structure Fascia Exposed: No Fat Layer (Subcutaneous Tissue) Exposed: No Tendon Exposed: No Muscle Exposed: No Joint Exposed: No Bone Exposed: No n in Area: -14.2% n in Volume: -13.6% ization: None : No ng: No Electronic Signature(s) Signed: 10/05/2019 4:21:35 PM By: Mikeal Hawthorne EMT/HBOT Signed: 10/05/2019 5:19:45 PM By: Deon Pilling Previous Signature: 10/04/2019 5:41:11 PM Version By: Deon Pilling Entered By: Mikeal Hawthorne on 10/05/2019 13:53:11 -------------------------------------------------------------------------------- Wound Assessment Details Patient Name: Date of Service: Daniel Pia D. 10/04/2019 3:15 PM Medical Record [id number] Patient Account Number: [id number] Date of Birth/Sex: Treating RN: 08/31/1941 (79 y.o. [name] Primary Care Kenneth Cuaresma: H[place]hel Party Other Clinician: Referring Lexis Potenza: Treating Sugar Vanzandt/Extender:Stone III, Deno Etienne, [place] Weeks in Treatment: 31 Wound Status Wound Number: 22 Primary Diabetic Wound/Ulcer of the Lower Extremity Etiology: Wound Location: Right Toe Third Wound Open Wounding Event: Gradually Appeared Status: Date Acquired: 09/23/2019 Comorbid Cataracts, Hypertension,  Peripheral Venous Weeks Of Treatment: 1 History: Disease, Type II Diabetes, Gout, Received Clustered Wound: Yes  Radiation Photos Wound Measurements Length: (cm) 1.1 % Reduct Width: (cm) 0.9 % Reduct Depth: (cm) 0.1 Epitheli Clustered Quantity: 3 Tunnelin Area: (cm) 0.778 Undermi Volume: (cm) 0.078 Wound Description Classification: Grade 2 Wound Margin: Distinct, outline attached Exudate Amount: Small Exudate Type: Serous Exudate Color: amber Wound Bed Granulation Amount: None Present (0%) Necrotic Amount: Large (67-100%) Necrotic Quality: Adherent Slough Foul Odor After Cleansing: No Slough/Fibrino Yes Exposed Structure Fascia Exposed: No Fat Layer (Subcutaneous Tissue) Exposed: Yes Tendon Exposed: No Muscle Exposed: No Joint Exposed: No Bone Exposed: No ion in Area: 34% ion in Volume: 33.9% alization: None g: No ning: No Electronic Signature(s) Signed: 10/05/2019 4:21:35 PM By: Mikeal Hawthorne EMT/HBOT Signed: 10/05/2019 5:19:45 PM By: Deon Pilling Previous Signature: 10/04/2019 5:41:11 PM Version By: Deon Pilling Entered By: Mikeal Hawthorne on 10/05/2019 13:52:48 -------------------------------------------------------------------------------- Wound Assessment Details Patient Name: Date of Service: Daniel Pia D. 10/04/2019 3:15 PM Medical Record [id number] Patient Account Number: [id number] Date of Birth/Sex: Treating RN: 03/30/1941 (79 y.o. [name] Primary Care Makensie Mulhall: [place] Party Other Clinician: Referring Quantrell Splitt: Treating Elliyah Liszewski/Extender:Stone III, Deno Etienne, NIM[place] Weeks in Treatment: 31 Wound Status Wound Number: [id number] Primary Etiology: Venous Leg Ulcer Wound Location: Left, Proximal, Medial Lower Leg Wound Status: Converted Wounding Event: Gradually Appeared Date Acquired: 09/26/2019 Weeks Of Treatment: 1 Clustered Wound: No Wound Description Classification: Full Thickness Without Exposed Support Structures Electronic  Signature(s) Signed: 10/04/2019 5:41:11 PM By: Deon Pilling Entered By: Deon Pilling on 10/04/2019 16:09:13 -------------------------------------------------------------------------------- Wound Assessment Details Patient Name: Date of Service: [name], [name] 10/04/2019 3:15 PM Medical Record [id number] Patient Account Number: [id number] Date of Birth/Sex: Treating RN: [birth year] (79 y.o. [name] Primary Care Rhyan Wolters: Hurshel Party Other Clinician: Referring Raha Tennison: Treating Alain Deschene/Extender:Stone III, Deno Etienne, NIMISH Weeks in Treatment: 31 Wound Status Wound Number: [id number] Primary Diabetic Wound/Ulcer of the Lower Extremity Etiology: Wound Location: Right Calf - Posterior Wound Open Wounding Event: Gradually Appeared Status: Date Acquired: 02/28/2019 Comorbid Cataracts, Hypertension, Peripheral Venous Weeks Of Treatment: 31 History: Disease, Type II Diabetes, Gout, Received Clustered Wound: No Radiation Photos Wound Measurements Length: (cm) 2 Width: (cm) 1.5 Depth: (cm) 0.1 Area: (cm) 2.356 Volume: (cm) 0.236 Wound Description Classification: Grade 2 Wound Margin: Distinct, outline attached Exudate Amount: Medium Exudate Type: Serous Exudate Color: amber Wound Bed Granulation Amount: Medium (34-66%) Granulation Quality: Pink Necrotic Amount: Medium (34-66%) Necrotic Quality: Adherent Slough r After Cleansing: No ibrino Yes Exposed Structure Exposed: No yer (Subcutaneous Tissue) Exposed: Yes Exposed: No Exposed: No Exposed: No xposed: No % Reduction in Area: 90.4% % Reduction in Volume: 90.4% Epithelialization: Small (1-33%) Tunneling: No Undermining: No Foul Odo Slough/F Fascia Fat La Tendon Muscle Joint Bone E Electronic Signature(s) Signed: 10/05/2019 4:21:35 PM By: Mikeal Hawthorne EMT/HBOT Signed: 10/05/2019 5:19:45 PM By: Deon Pilling Previous Signature: 10/04/2019 5:41:11 PM Version By: Deon Pilling Entered By: Mikeal Hawthorne on 10/05/2019 13:51:46 -------------------------------------------------------------------------------- Vitals Details Patient Name: Date of Service: Daniel Pia D. 10/04/2019 3:15 PM Medical Record [id number] Patient Account Number: [id number] Date of Birth/Sex: Treating RN: 05-Oct-1940 (79 y.o. [name] Primary Care Cerise Lieber: Hurshel Party Other Clinician: Referring Hoa Briggs: Treating Morrell Fluke/Extender:Stone III, Deno Etienne, [place] Weeks in Treatment: 31 Vital Signs Time Taken: 15:55 Temperature (F): 97.8 Height (in): 74 Pulse (bpm): 102 Weight (lbs): 212 Respiratory Rate (breaths/min): 26 Body Mass Index (BMI): 27.2 Blood Pressure (mmHg): 132/78 Reference Range: 80 - 120 mg / dl Airway Pulse Oximetry (%): 100 Notes rechecked heart rate manually 88 and Resp Rate rechecked 26 after 10 minutes  of sitting in chair. patient is using accessory muscles, pale, and does not overall feel well. MD made aware. Electronic Signature(s) Signed: 10/04/2019 5:41:11 PM By: Deon Pilling Entered By: Deon Pilling on 10/04/2019 16:18:16

## 2019-10-04 NOTE — Discharge Instructions (Signed)
Radial Site Care  This sheet gives you information about how to care for yourself after your procedure. Your health care provider may also give you more specific instructions. If you have problems or questions, contact your health care provider. What can I expect after the procedure? After the procedure, it is common to have:  Bruising and tenderness at the catheter insertion area. Follow these instructions at home: Medicines  Take over-the-counter and prescription medicines only as told by your health care provider. Insertion site care  Follow instructions from your health care provider about how to take care of your insertion site. Make sure you: ? Wash your hands with soap and water before you change your bandage (dressing). If soap and water are not available, use hand sanitizer. ? Change your dressing as told by your health care provider. ? Leave stitches (sutures), skin glue, or adhesive strips in place. These skin closures may need to stay in place for 2 weeks or longer. If adhesive strip edges start to loosen and curl up, you may trim the loose edges. Do not remove adhesive strips completely unless your health care provider tells you to do that.  Check your insertion site every day for signs of infection. Check for: ? Redness, swelling, or pain. ? Fluid or blood. ? Pus or a bad smell. ? Warmth.  Do not take baths, swim, or use a hot tub until your health care provider approves.  You may shower 24-48 hours after the procedure, or as directed by your health care provider. ? Remove the dressing and gently wash the site with plain soap and water. ? Pat the area dry with a clean towel. ? Do not rub the site. That could cause bleeding.  Do not apply powder or lotion to the site. Activity   For 24 hours after the procedure, or as directed by your health care provider: ? Do not flex or bend the affected arm. ? Do not push or pull heavy objects with the affected arm. ? Do not  drive yourself home from the hospital or clinic. You may drive 24 hours after the procedure unless your health care provider tells you not to. ? Do not operate machinery or power tools.  Do not lift anything that is heavier than 10 lb (4.5 kg), or the limit that you are told, until your health care provider says that it is safe.  Ask your health care provider when it is okay to: ? Return to work or school. ? Resume usual physical activities or sports. ? Resume sexual activity. General instructions  If the catheter site starts to bleed, raise your arm and put firm pressure on the site. If the bleeding does not stop, get help right away. This is a medical emergency.  If you went home on the same day as your procedure, a responsible adult should be with you for the first 24 hours after you arrive home.  Keep all follow-up visits as told by your health care provider. This is important. Contact a health care provider if:  You have a fever.  You have redness, swelling, or yellow drainage around your insertion site. Get help right away if:  You have unusual pain at the radial site.  The catheter insertion area swells very fast.  The insertion area is bleeding, and the bleeding does not stop when you hold steady pressure on the area.  Your arm or hand becomes pale, cool, tingly, or numb. These symptoms may represent a serious problem   that is an emergency. Do not wait to see if the symptoms will go away. Get medical help right away. Call your local emergency services (911 in the [place] Do not drive yourself to the hospital. Summary  After the procedure, it is common to have bruising and tenderness at the site.  Follow instructions from your health care provider about how to take care of your radial site wound. Check the wound every day for signs of infection.  Do not lift anything that is heavier than 10 lb (4.5 kg), or the limit that you are told, until your health care provider says  that it is safe. This information is not intended to replace advice given to you by your health care provider. Make sure you discuss any questions you have with your health care provider. Document Revised: 10/20/2017 Document Reviewed: 10/20/2017 Elsevier Patient Education  2020 [employer]  

## 2019-10-04 NOTE — Interval H&P Note (Signed)
Cath Lab Visit (complete for each Cath Lab visit)  Clinical Evaluation Leading to the Procedure:   ACS: No.  Non-ACS:    Anginal Classification: CCS III  Anti-ischemic medical therapy: Minimal Therapy (1 class of medications)  Non-Invasive Test Results: No non-invasive testing performed  Prior CABG: No previous CABG      History and Physical Interval Note:  10/04/2019 10:27 AM  [name]  has presented today for surgery, with the diagnosis of chest pain.  The various methods of treatment have been discussed with the patient and family. After consideration of risks, benefits and other options for treatment, the patient has consented to  Procedure(s): RIGHT/LEFT HEART CATH AND CORONARY ANGIOGRAPHY (N/A) as a surgical intervention.  The patient's history has been reviewed, patient examined, no change in status, stable for surgery.  I have reviewed the patient's chart and labs.  Questions were answered to the patient's satisfaction.     Belva Crome III

## 2019-10-04 NOTE — ED Notes (Signed)
Pt decided to stay and be seen.

## 2019-10-05 ENCOUNTER — Encounter (HOSPITAL_BASED_OUTPATIENT_CLINIC_OR_DEPARTMENT_OTHER): Payer: Medicare HMO | Admitting: Internal Medicine

## 2019-10-05 DIAGNOSIS — I890 Lymphedema, not elsewhere classified: Secondary | ICD-10-CM | POA: Diagnosis not present

## 2019-10-05 DIAGNOSIS — S91105A Unspecified open wound of left lesser toe(s) without damage to nail, initial encounter: Secondary | ICD-10-CM | POA: Diagnosis not present

## 2019-10-05 DIAGNOSIS — L97511 Non-pressure chronic ulcer of other part of right foot limited to breakdown of skin: Secondary | ICD-10-CM | POA: Diagnosis not present

## 2019-10-05 DIAGNOSIS — L97221 Non-pressure chronic ulcer of left calf limited to breakdown of skin: Secondary | ICD-10-CM | POA: Diagnosis not present

## 2019-10-05 DIAGNOSIS — L97529 Non-pressure chronic ulcer of other part of left foot with unspecified severity: Secondary | ICD-10-CM | POA: Diagnosis not present

## 2019-10-05 DIAGNOSIS — E11621 Type 2 diabetes mellitus with foot ulcer: Secondary | ICD-10-CM | POA: Diagnosis not present

## 2019-10-05 DIAGNOSIS — E1151 Type 2 diabetes mellitus with diabetic peripheral angiopathy without gangrene: Secondary | ICD-10-CM | POA: Diagnosis not present

## 2019-10-05 DIAGNOSIS — I87312 Chronic venous hypertension (idiopathic) with ulcer of left lower extremity: Secondary | ICD-10-CM | POA: Diagnosis not present

## 2019-10-05 DIAGNOSIS — L97211 Non-pressure chronic ulcer of right calf limited to breakdown of skin: Secondary | ICD-10-CM | POA: Diagnosis not present

## 2019-10-05 DIAGNOSIS — E1142 Type 2 diabetes mellitus with diabetic polyneuropathy: Secondary | ICD-10-CM | POA: Diagnosis not present

## 2019-10-05 DIAGNOSIS — L03116 Cellulitis of left lower limb: Secondary | ICD-10-CM | POA: Diagnosis not present

## 2019-10-05 DIAGNOSIS — L97524 Non-pressure chronic ulcer of other part of left foot with necrosis of bone: Secondary | ICD-10-CM | POA: Diagnosis not present

## 2019-10-05 LAB — CBC WITH DIFFERENTIAL/PLATELET
Abs Immature Granulocytes: 0.03 10*3/uL (ref 0.00–0.07)
Basophils Absolute: 0 10*3/uL (ref 0.0–0.1)
Basophils Relative: 0 %
Eosinophils Absolute: 0 10*3/uL (ref 0.0–0.5)
Eosinophils Relative: 0 %
HCT: 42.7 % (ref 39.0–52.0)
Hemoglobin: 13.7 g/dL (ref 13.0–17.0)
Immature Granulocytes: 0 %
Lymphocytes Relative: 11 %
Lymphs Abs: 0.9 10*3/uL (ref 0.7–4.0)
MCH: 34.5 pg — ABNORMAL HIGH (ref 26.0–34.0)
MCHC: 32.1 g/dL (ref 30.0–36.0)
MCV: 107.6 fL — ABNORMAL HIGH (ref 80.0–100.0)
Monocytes Absolute: 1.1 10*3/uL — ABNORMAL HIGH (ref 0.1–1.0)
Monocytes Relative: 13 %
Neutro Abs: 6.4 10*3/uL (ref 1.7–7.7)
Neutrophils Relative %: 76 %
Platelets: 189 10*3/uL (ref 150–400)
RBC: 3.97 MIL/uL — ABNORMAL LOW (ref 4.22–5.81)
RDW: 14.5 % (ref 11.5–15.5)
WBC: 8.4 10*3/uL (ref 4.0–10.5)
nRBC: 0 % (ref 0.0–0.2)

## 2019-10-05 MED ORDER — CEPHALEXIN 500 MG PO CAPS: 500.0000 mg | ORAL_CAPSULE | Freq: Four times a day (QID) | ORAL | 0 refills | Status: DC

## 2019-10-05 MED ORDER — LEVOFLOXACIN IN D5W 750 MG/150ML IV SOLN
750.0000 mg | Freq: Once | INTRAVENOUS | Status: AC
  Administered 2019-10-05: 750 mg via INTRAVENOUS
  Filled 2019-10-05: qty 150

## 2019-10-05 NOTE — ED Provider Notes (Signed)
[facility] DEPT Provider Note: Georgena Spurling, MD, FACEP  CSN: [id number] MRN: [id number] ARRIVAL: 10/04/19 at [place]: [id number]/WA27   CHIEF COMPLAINT  Leg Pain   HISTORY OF PRESENT ILLNESS  10/05/19 12:10 AM [name] is a 79 y.o. male with chronic wounds of the lower legs and feet.  He was seen in the wound care clinic yesterday.  They determined his right leg to be improving well and it is wrapped in an [employer].  There was concerned that his left leg and foot were not doing as well and he was sent to the ED for evaluation.  The patient denies fever or chills.  He denies any new pain in his left leg but does have chronic bilateral pain which he rates as a 6 out of 10.  There is oozing of serous fluid from the wounds.   Past Medical History:  Diagnosis Date  . Cancer ([place])   . CHF (congestive heart failure) ([place])   . Chronic atrial fibrillation ([place])   . Chronic bronchitis   . Diabetes mellitus, type II ([place])    no insulin  . Gout   . History of herpes zoster virus   . History of radiation therapy 12/21/12- 02/15/13   prostate 78 gray in 40 fx  . Hyperlipidemia   . Hypertension   . Prostate cancer ([place]) 2014   EBRT + hormonal therapy  . Vitamin D deficiency disease 06/07/2019    Past Surgical History:  Procedure Laterality Date  . KNEE SURGERY     left knee  . PROSTATE BIOPSY      Family History  Problem Relation Age of Onset  . Kidney failure Mother   . Heart attack Father     Social History   Tobacco Use  . Smoking status: Never Smoker  . Smokeless tobacco: Never Used  Substance Use Topics  . Alcohol use: No    Alcohol/week: 0.0 standard drinks  . Drug use: No    Prior to Admission medications   Medication Sig Start Date End Date Taking? Authorizing Provider  acetaminophen (TYLENOL) 500 MG tablet Take 500 mg by mouth every 6 (six) hours as needed for mild pain or headache.     [provider]  allopurinol (ZYLOPRIM) 300 MG tablet TAKE 1  TABLET TWICE DAILY Patient taking differently: Take 300 mg by mouth daily.  09/13/19 12/12/19  Doree Albee, MD  ascorbic acid (VITAMIN C) 250 MG CHEW Chew 500 mg by mouth daily as needed (Cold).     [provider]  carvedilol (COREG) 25 MG tablet Take 1.5 tablets (37.5 mg total) by mouth 2 (two) times daily. 09/25/19 12/24/19  Isaiah Serge, NP  cephALEXin (KEFLEX) 500 MG capsule Take 1 capsule (500 mg total) by mouth 4 (four) times daily. 10/05/19   Kerim Statzer, MD  Cholecalciferol (VITAMIN D-3) 125 MCG (5000 UT) TABS Take 5,000 Units by mouth daily.     [provider]  furosemide (LASIX) 40 MG tablet Take 1 tablet (40 mg total) by mouth daily. 09/27/19 12/26/19  Isaiah Serge, NP  glipiZIDE (GLUCOTROL) 5 MG tablet TAKE 1 TABLET EVERY DAY Patient taking differently: Take 5 mg by mouth daily.  09/21/19 12/20/19  Doree Albee, MD  guaifenesin (WAL-TUSSIN) 100 MG/5ML syrup Take 200 mg by mouth 3 (three) times daily as needed for cough.     [provider]  lisinopril (ZESTRIL) 20 MG tablet TAKE 1 TABLET TWICE DAILY Patient taking  differently: Take 20 mg by mouth daily.  09/02/19 12/01/19  Doree Albee, MD  lovastatin (MEVACOR) 40 MG tablet TAKE 1 TABLET EVERY DAY Patient taking differently: Take 40 mg by mouth at bedtime.  09/13/19 12/12/19  Doree Albee, MD  NON FORMULARY Take 3 tablets by mouth daily. MANGA-CAL    [provider]  spironolactone (ALDACTONE) 25 MG tablet Take 1 tablet (25 mg total) by mouth daily. 09/13/19 12/12/19  Verta Ellen., NP  vitamin E 400 UNIT capsule Take 800 Units by mouth daily.     [provider]  warfarin (COUMADIN) 5 MG tablet TAKE 1 TABLET EVERY DAY Patient taking differently: Take 5 mg by mouth See admin instructions. 2.5 mg on Saturday All the other day take 5 mg 09/13/19 12/12/19  Doree Albee, MD  zinc gluconate 50 MG tablet Take 25 mg by mouth daily.     [provider]   allopurinol (ZYLOPRIM) 300 MG tablet Take 300 mg by mouth 2 (two) times daily.     [provider]  allopurinol (ZYLOPRIM) 300 MG tablet TAKE 1 TABLET TWICE DAILY 07/06/19   Gosrani, Nimish C, MD  lisinopril (PRINIVIL,ZESTRIL) 20 MG tablet Take 1 tablet by mouth 2 (two) times a day. 04/26/19   Doree Albee, MD  lovastatin (MEVACOR) 40 MG tablet TAKE ONE TABLET BY MOUTH EVERY DAY AT BEDTIME 06/03/12   Yehuda Savannah, MD  lovastatin (MEVACOR) 40 MG tablet TAKE 1 TABLET EVERY DAY 07/06/19   Hurshel Party C, MD  warfarin (COUMADIN) 5 MG tablet Take 5 mg by mouth daily. Or as directed by anticoagulation clinic     [provider]  warfarin (COUMADIN) 5 MG tablet TAKE 1 TABLET EVERY DAY 07/06/19   Doree Albee, MD    Allergies Clarithromycin and Fluoride preparations   REVIEW OF SYSTEMS  Negative except as noted here or in the History of Present Illness.   PHYSICAL EXAMINATION  Initial Vital Signs Blood pressure (!) 127/101, pulse 84, temperature 98 F (36.7 C), temperature source Oral, resp. rate 16, height 6\' 2"  (1.88 m), weight 100.7 kg, SpO2 98 %.  Examination General: Well-developed, well-nourished male in no acute distress; appearance consistent with age of record HENT: normocephalic; atraumatic Eyes: pupils equal, round and reactive to light; extraocular muscles intact Neck: supple Heart: regular rate and rhythm Lungs: clear to auscultation bilaterally Abdomen: soft; nondistended; nontender; bowel sounds present Extremities: No deformity; right lower leg and foot and [place] boot; left lower leg and foot with chronic appearing hyperemia, ulcerations and oozing without warmth or purulent drainage, DP and PT pulses not palpable due to edema but capillary refill is brisk distally:    Neurologic: Awake, alert and oriented; motor function intact in all extremities and symmetric; no facial droop Skin: Warm and dry Psychiatric: Normal mood and affect    RESULTS  Summary of this visit's results, reviewed and interpreted by myself:   EKG Interpretation  Date/Time:    Ventricular Rate:    PR Interval:    QRS Duration:   QT Interval:    QTC Calculation:   R Axis:     Text Interpretation:        Laboratory Studies: Results for orders placed or performed during the hospital encounter of 10/04/19 (from the past 24 hour(s))  CBC with Differential     Status: Abnormal   Collection Time: 10/05/19 12:54 AM  Result Value Ref Range   WBC 8.4 4.0 - 10.5  K/uL   RBC 3.97 (L) 4.22 - 5.81 MIL/uL   Hemoglobin 13.7 13.0 - 17.0 g/dL   HCT 42.7 39.0 - 52.0 %   MCV 107.6 (H) 80.0 - 100.0 fL   MCH 34.5 (H) 26.0 - 34.0 pg   MCHC 32.1 30.0 - 36.0 g/dL   RDW 14.5 11.5 - 15.5 %   Platelets 189 150 - 400 K/uL   nRBC 0.0 0.0 - 0.2 %   Neutrophils Relative % 76 %   Neutro Abs 6.4 1.7 - 7.7 K/uL   Lymphocytes Relative 11 %   Lymphs Abs 0.9 0.7 - 4.0 K/uL   Monocytes Relative 13 %   Monocytes Absolute 1.1 (H) 0.1 - 1.0 K/uL   Eosinophils Relative 0 %   Eosinophils Absolute 0.0 0.0 - 0.5 K/uL   Basophils Relative 0 %   Basophils Absolute 0.0 0.0 - 0.1 K/uL   Immature Granulocytes 0 %   Abs Immature Granulocytes 0.03 0.00 - 0.07 K/uL   Imaging Studies: CARDIAC CATHETERIZATION  Result Date: 10/04/2019  Widely patent coronary arteries, essentially normal for the patient's age.  Trivial mid LAD less than 25% eccentric plaque.  Mild pulmonary hypertension with mean pulmonary artery pressure of 31 mmHg  Severe left ventricular systolic dysfunction with EF less than 25%.  Elevated LVEDP at 21 mmHg consistent with acute on chronic combined systolic and diastolic heart failure. RECOMMENDATIONS:  Guideline directed therapy for heart failure and atrial fibrillation rate control.  Heart rate was relatively fast during the procedure.  Resume usual Coumadin dose later today.  Discharge home later today.   ED COURSE and MDM  Nursing notes, initial and  subsequent vitals signs, including pulse oximetry, reviewed and interpreted by myself.  Vitals:   10/04/19 1934  BP: (!) 127/101  Pulse: 84  Resp: 16  Temp: 98 F (36.7 C)  TempSrc: Oral  SpO2: 98%  Weight: 100.7 kg  Height: 6\' 2"  (1.88 m)   Medications  levofloxacin (LEVAQUIN) IVPB 750 mg (750 mg Intravenous New Bag/Given 10/05/19 0112)   1:17 AM Patient given levofloxacin 750 mg IV in the ED.  His leg is hyperemic but not hot, he has no fever and no leukocytosis.  This appears to be primarily a chronic process.  We will treat with a course of Keflex and refer back to the wound care center.  I do not believe admission is indicated at this time.   PROCEDURES  Procedures   ED DIAGNOSES     ICD-10-CM   1. Stasis dermatitis of both legs  I87.2   2. Cellulitis of left lower leg  L03.[place]        Josphine Laffey, MD 10/05/19 [id number]

## 2019-10-06 ENCOUNTER — Telehealth: Payer: Self-pay | Admitting: Cardiovascular Disease

## 2019-10-06 NOTE — Telephone Encounter (Signed)
Patient's wife called stating that she has a question about patient's cath site

## 2019-10-06 NOTE — Progress Notes (Signed)
SANDFORD, DIOP ([id number]) Visit Report for 10/05/2019 HPI Details Patient Name: Date of Service: ALFONS, SULKOWSKI 10/05/2019 1:45 PM Medical Record [place] Patient Account Number: [id number] Date of Birth/Sex: Treating RN: [birth year] (79 y.o. M) Primary Care Provider: Hurshel Party Other Clinician: Referring Provider: Treating Provider/Extender:Kenidee Cregan, Cheryl Flash, [place] Weeks in Treatment: 31 History of Present Illness Location: Patient presents with a wound to left lower leg. Quality: Patient reports No Pain. Duration: 2 months HPI Description: no cig or alcohol. spontaneous appearance in area of stasis dermamtitis. d.m. on metformin only. chronic afib on Coumadin. diabetes and coag studies not good. hba1c 7.5. ivr 4.5. no pain or sxs of systemic disease. hx chf. no intermittent claudication 02/28/2019 Readmission This is a now a 79 year old man who was previously cared for in 2016 by Dr. Lindon Romp for wounds on his lower extremities. At that point he had venous reflux studies although I cannot seem to open these in [place] link. He had arterial studies showing an ABI of 1.11 on the right and 1.27 on the left his waveforms were triphasic bilaterally. He was discharged in stockings although I do not believe he is wearing these in some time. He tells me that about a month ago he noted openings of a large wound on the posterior right calf and 2 smaller areas on the left lateral calf and a small area more recently on the left posterior calf. He has been dressing these with peroxide and triple antibiotic ointment. He is not wearing compression. Past medical history; type 2 diabetes with peripheral neuropathy, chronic venous insufficiency, hypertension, cardiomyopathy, chronic atrial fibrillation on Coumadin, prostate cancer, hyperlipidemia, gout, ABI in our clinic was 1.34 on the left and not obtainable on the right 6/9; this is a patient who has chronic venous  insufficiency. He has a fairly substantial area on the right posterior calf, left lateral calf and a small area on the left posterior calf. On arrival last week he had very palpable popliteal and femoral pulses but nothing in his bilateral feet. Unfortunately we cannot get arterial studies until July 1 at Dr. Kennon Holter office. They live in [place]. We use silver alginate under Kerlix Coban 6/16; patient with chronic venous insufficiency with wounds on his bilateral lower extremities. When he came into our clinic he was discovered to have a complete absence of peripheral pedal pulses at either the dorsalis pedis or posterior tibial. He does have easily palpable femoral and popliteal pulses. He sees Dr. [name] tomorrow for noninvasive arterial tests. He may also require venous reflux evaluation although I do not view this as an urgent thing. We have been using silver alginate. His wound surfaces of cleaned up quite nicely 6/23; patient with chronic venous insufficiency with wounds on his bilateral lower extremities. His wounds all are somewhat better looking. He did go to Dr. Kennon Holter office but somehow ended up on the doctors schedule rather than being scheduled for noninvasive tests therefore his noninvasive tests are scheduled for July 1. We agree that he has venous insufficiency ulcers but I cannot feel any pulses in his lower extremities dictating the need for test. We are only using Kerlix and light Coban unfortunately this appears to be holding the edema 6/30; has his arterial studies tomorrow. We have been using Kerlix and light Coban will go to a more aggressive compression if the arterial studies will allow. We all agreed these are venous wounds however I cannot feel pulses at either the dorsalis pedis or posterior tibial bilaterally. His  wounds generally look some better including left lateral and right posterior. 7/7-Patient returns at 1 week in Kerlix/Coban to both legs, with improvement,  in the left lateral and right posterior lower leg wounds, ABI's are normal in both legs per vascular studies, TBI is also normal on both sides, we are using hydrofera blue to the wounds 7/14; patient's arterial studies from 2 weeks ago showed an ABI on the right at 1.03 with a TBI of 0.86. On the left the ABI was 1.06 with a TBI of 0.84. Notable for the fact that his arterial waveforms were monophasic in all of the lower extremity arteries suggesting some degree of arterial occlusive disease but in general this was felt to be fairly adequate for healing. His compression was increased from 2-3 layer which is appropriate. Dressing was changed to [facility] 7/21; patient's wounds are measuring smaller. The more substantial one on the right posterior calf, second 1 on the left lateral calf. Using [facility] on both wound areas 7/28; patient continues to make nice improvements. The area on the right posterior calf is smaller. Area on the left lateral calf also is smaller. We have been using Hydrofera Blue under compression. The patient will need compression stockings and we have measured him for these in the eventuality that these heal which really should not be too long from now 8/4-Patient continues to make improvement, the right posterior calf area smaller with rim of keratotic skin on one side, the left wound is definitely smaller and improving. 8/11-Returns at 1 week, after being in 3 layer compression on both legs, both wounds appear to be improving, making good progress, patient is happy, pain is also less especially in the right leg wound 8/18; the area on the left anterior lower leg is healed. On the right posterior leg the wound remains although the dimensions are a lot better. 8/25; he arrives in clinic today with a large body of open wound on the left lateral calf. All of the 3 wounds in this area are in close juxtaposition to each other. The story is that we discharged him last  week with no a wrap on the left leg. They went to [place] could not get in as they are only excepting phone orders or online orders for stockings hence they did not put any stocking on the left leg all week. They have something at home but the patient with that was either incapable or just did not put them on. Apparently these opened 1 morning after getting out of bed. The area on the right has no real change 9/1; patient has bilateral lower extremity wounds in the setting of severe chronic venous insufficiency and secondary lymphedema. He arrived last week with new areas on the left lateral lower leg after we did not wrap him and he did not use his stockings. Nevertheless the areas on the left look better today under compression. Posterior right calf does not really changed. We are using Hydrofera Blue on both areas under compression 9/15; bilateral lower extremity wounds in the setting of severe chronic venous insufficiency and secondary lymphedema. He has 20 to 30 mmHg below-knee compression stockings under the eventuality that these close over. We did get the left leg to close but he did not transition to a stocking and this reopened. There are 2 open areas on the left posterior lateral calf and one on the right. Both of these look satisfactory. Using [facility] 9/22; bilateral lower extremity wounds in the setting of  chronic venous insufficiency. 2 superficial areas on the left lateral calf. One on the right just above the Achilles area. We have good edema control we have been using Hydrofera Blue 9/29; the areas on the left lateral calf are healed. On the right just above the Achilles and tendon area things look a lot better small wounds one scabbed area. We have been using Hydrofera Blue. We can discharge him in his own stocking on the left still wrapping on the right. This is the second time we have healed the left leg but he did not put a stocking on last time. Hopefully this will  maintain the edema from chronic venous disease with secondary lymphedema 10/6; he comes in today having a stocking on the left leg. They had trouble getting it on there is a lot of increase in swelling 2 small open areas one anteriorly and one on the medial calf. They report a lot of difficulty getting the stocking on. Paradoxically the area on the right that we have been wrapping posteriorly is closed 10/13; he comes in today with wounds bilaterally including superficial areas on the left medial and left lateral calf. As well as the right posterior has reopened in the Achilles area superiorly. He still does not have his juxta lite stockings although truthfully we would not of been able to use them today anyway. Apparently have been ordered and paid for from [employer] although they have not been delivered 10/20; his area on the right is just the boat closed on the right posterior. Still has the area on the left lateral and a very tiny area on the left medial. He has his bilateral juxta lites although he is not ready for them this week. He tolerated the increase to 4 layer compression last week quite well 10/27; the area on the right posterior calf is once again closed. He has a superficial area on the left lateral calf that is still open. He has been using Hydrofera Blue and bilateral 4-layer compression. He can change to his own juxta lite stocking on the right and we are instructing him today 11/3; the area on the right posterior calf reopened according to the patient and his wife after they took off the stocking when they got home last week. Apparently scabbed over there is now a fairly substantial wound which looks pretty much the same. Our intake nurse noted that they were using the juxta lite stockings appropriately. I was really hoping I might be able to close him out today. He has 1 very tiny remaining area on the left lateral lower leg. 11/10; right posterior calf wound measures smaller but  is still open. We have been using Hydrofera Blue. On the left he has a small oval-shaped wound and he seems to have had another wound distally that is open and likely a blister. We are using Hydrofera Blue under compression 11/17; right posterior calf wound continues to get better. We have been using Hydrofera Blue. On the left lateral one of the wounds has closed still a small open area. We have been using Hydrofera Blue on this as well. Both areas have been under 4-layer compression Arrives in clinic today with some swelling in the dorsal foot on the right some erythema of his forefoot and toes. Initially when I looked at this I almost thought this was a sunburn distal to a wrap injury. 12/1; right posterior calf wound debrided with a curette. We have been using Hydrofera Blue on the left anterior  lateral he has an area across the mid tibia. Finally a small area on the left lateral lower calf. Finally he continues to have de-epithelialized areas on the dorsal aspect of his toes. Initially thought this might be a burn injury when I saw him 2 weeks ago. I now wonder about tinea. I have also reviewed his arterial studies which were really quite good in July/20 with normal TBI's and ABIs but monophasic waveforms 12/8; comes in today with worsening problems especially on the left leg where he now has a cluster of wounds in the left anterior mid tibia. Very poor edema control. I reduced him to 3 layer from 4 layer compression last week because of some concern about blood flow to his toes however he does not have good edema control on the left leg. Right leg edema control looks satisfactory. On the left he has a cluster of wounds anteriorly, small area on the left medial fifth met head and then the collection of areas on his toes which appear better On the right he has the original area on the right posterior calf, a new area right medially. His formal arterial studies from mid July are noted below.  He was evaluated by Dr.Berry ABI Findings: +---------+------------------+-----+----------+--------+ Right Rt Pressure (mmHg)IndexWaveform Comment  +---------+------------------+-----+----------+--------+ Brachial 176     +---------+------------------+-----+----------+--------+ ATA 176 0.99 monophasic  +---------+------------------+-----+----------+--------+ PTA 183 1.03 monophasic  +---------+------------------+-----+----------+--------+ PERO 172 0.97 monophasic  +---------+------------------+-----+----------+--------+ Great Toe153 0.86    +---------+------------------+-----+----------+--------+ +---------+------------------+-----+-----------+-------+ Left Lt Pressure (mmHg)IndexWaveform Comment +---------+------------------+-----+-----------+-------+ Brachial 178     +---------+------------------+-----+-----------+-------+ ATA 162 0.91 multiphasic  +---------+------------------+-----+-----------+-------+ PTA 188 1.06 multiphasic  +---------+------------------+-----+-----------+-------+ PERO 158 0.89 monophasic   +---------+------------------+-----+-----------+-------+ Great Toe150 0.84    +---------+------------------+-----+-----------+-------+ +-------+-----------+-----------+------------+------------+ ABI/TBIToday's ABIToday's TBIPrevious ABIPrevious TBI +-------+-----------+-----------+------------+------------+ Right 1.03 0.86 1.11   +-------+-----------+-----------+------------+------------+ Left 1.06 0.84 1.27   +-------+-----------+-----------+------------+------------+ Tibial waveforms somewhat difficult to record due to irregular heartbeat. Bilateral ABIs appear essentially unchanged compared to prior study on 06/21/15. Summary: Right: Resting right ankle-brachial index is within normal range. No evidence of significant right lower extremity arterial disease. The right  toe-brachial index is normal. Although ankle brachial indices are within normal limits (0.95-1.29), arterial Doppler waveforms at the ankle suggest some component of arterial occlusive disease. Left: Resting left ankle-brachial index is within normal range. No evidence of significant left lower extremity arterial disease. The left toe-brachial index is normal. Although ankle brachial indices are within normal limits (0.95-1.29), arterial Doppler waveforms at the ankle suggest some component of arterial occlusive disease. 12/15; the patient's area on the left mid tibia looks better. Right posterior calf also better. He has the area on the left foot as well. All of his toes look better I think this was tinea. We are using Hydrofera Blue everywhere else The patient was in urgent care yesterday with wheezing and shortness of breath. He got azithromycin and prednisone. He feels better. His lungs are currently clear to auscultation. He was not tested for Covid 19 12/29; the patient arrives in clinic today with quite a bit change. 2 weeks ago he only had areas on the left mid tibia right posterior calf with tinea pedis resolving between his toes. He arrives in clinic today with several areas on the dorsal toes on the right, dorsal left second toe. He has skin breakdown in the left medial calf probably from excess edema. Small area proximally in the medial calf. He has weeping edema fluid coming out of the skin excoriations on the left medial calf. He tells me that  he is having a cardiac catheterization next week. I had a quick look at [facility] health link. He was found to have an ejection fraction of 25% during the work-up for persistent atrial fibrillation. He saw his cardiology office yesterday seen by the nurse practitioner. She increased his carvedilol. He has not been on diuretics for apparently several months and indeed in the nurse practitioner Dietrich Pates notes she had knowledge of this. 10/04/2019  on evaluation today patient presents as a walk-in visit concerning the fact that he did not have an appointment here for our clinic at this point. He actually had a cardiac catheterization earlier today and then came from there to here in order to be evaluated. With that being said unfortunately he is having significant cellulitis of his left lower extremity upon evaluation today this appears to have deteriorated even since last week's evaluation with Dr. Dellia Nims. The right lower extremity is actually doing okay I really see no evidence of deterioration at this point at those locations. In fact the right leg seems in general be doing quite well. Nonetheless I am concerned about infection and cellulitis of the left lower extremity and again considering his weakened heart I do not want him to develop into sepsis at all. He is also having some trouble breathing today and I understand according to nursing staff this is always the case to some degree. With that being said the patient unfortunately seems to be in my opinion a little bit worse even his wife feels like that may be the case today. Unsure exactly what is leading to this. Cardiac catheterization I did review the report which showed an ejection fraction of 25% he also had an LAD blockage of around 25% based on what I saw. With that being said there was no significant blockages that required stenting at this point he does have weakened cardiac muscles compared to normal. 10/05/2019; patient was seen yesterday in clinic. He was sent to the ER because of cellulitis of the left leg possibility. In the ER he was given 1 dose of IV Levaquin and discharged on Keflex. He came in the clinic initially for a nurse visit to rewrap his left leg. We did not look at the right leg today that is an Haematologist. The patient also had a cardiac cath. According to him there were no blockages but a very low ejection fraction. Electronic Signature(s) Signed: 10/06/2019  4:55:01 AM By: [name] MD Entered By: [name] on 10/05/2019 16:07:58 -------------------------------------------------------------------------------- Physical Exam Details Patient Name: Date of Service: [name], [name] 10/05/2019 1:45 PM Medical Record [id number] Patient Account Number: [id number] Date of Birth/Sex: Treating RN: [birth year] (79 y.o. M) Primary Care Provider: Hurshel Party Other Clinician: Referring Provider: Treating Provider/Extender:Jermarcus Mcfadyen, Cheryl Flash, [place] Weeks in Treatment: 31 Constitutional Sitting or standing Blood Pressure is within target range for patient.. Pulse regular and within target range for patient.Marland Kitchen Respirations regular, non-labored and within target range.. Temperature is normal and within the target range for the patient.Marland Kitchen Appears in no distress. Eyes Conjunctivae clear. No discharge.no icterus. Respiratory work of breathing is normal. Bilateral breath sounds are clear and equal in all lobes with no wheezes, rales or rhonchi.. Cardiovascular A. fib with a controlled ventricular rate. JVP is borderline. No sacral edema. 3+ pitting edema in the left calf.. Gastrointestinal (GI) Abdomen is soft and non-distended without masses or tenderness.Marland Kitchen Psychiatric appears at normal baseline. Notes Wound exam; I did not look at the patient's right leg today. Multiple superficial wounds  in the dorsal left foot and in the calf anteriorly laterally and medially. He has dusky erythema but no warmth or acute tenderness. Electronic Signature(s) Signed: 10/06/2019 4:55:01 AM By: [name] MD Entered By: [name] on 10/05/2019 16:09:29 -------------------------------------------------------------------------------- Physician Orders Details Patient Name: Date of Service: ZENITH, LAMPHIER 10/05/2019 1:45 PM Medical Record [id number] Patient Account Number: [id number] Date of Birth/Sex: Treating RN: 05/07/1941 (79  y.o. [name] Primary Care Provider: Hurshel Party Other Clinician: Referring Provider: Treating Provider/Extender:Ariannah Arenson, Cheryl Flash, [place] Weeks in Treatment: 59 Verbal / Phone Orders: No Diagnosis Coding ICD-10 Coding Code Description E11.51 Type 2 diabetes mellitus with diabetic peripheral angiopathy without gangrene L97.211 Non-pressure chronic ulcer of right calf limited to breakdown of skin E11.42 Type 2 diabetes mellitus with diabetic polyneuropathy L97.221 Non-pressure chronic ulcer of left calf limited to breakdown of skin I87.333 Chronic venous hypertension (idiopathic) with ulcer and inflammation of bilateral lower extremity B35.3 Tinea pedis L97.521 Non-pressure chronic ulcer of other part of left foot limited to breakdown of skin L97.511 Non-pressure chronic ulcer of other part of right foot limited to breakdown of skin Follow-up Appointments Return Appointment in 1 week. - Tuesday Dressing Change Frequency Wound #13 Left Toe Second Change dressing every day. Wound #18 Left,Medial Foot Do not change entire dressing for one week. Wound #19 Left,Circumferential Lower Leg Do not change entire dressing for one week. Wound #3R Right,Posterior Calf Do not change entire dressing for one week. Skin Barriers/Peri-Wound Care Wound #18 Left,Medial Foot TCA Cream or Ointment Wound Cleansing May shower with protection. Primary Wound Dressing Wound #13 Left Toe Second Calcium Alginate with Silver Wound #18 Left,Medial Foot Calcium Alginate with Silver Wound #19 Left,Circumferential Lower Leg Calcium Alginate with Silver Wound #20 Right Toe Great Calcium Alginate with Silver Wound #21 Right Toe Second Calcium Alginate with Silver Wound #22 Right Toe Third Calcium Alginate with Silver Wound #3R Right,Posterior Calf Iodoflex Secondary Dressing Wound #13 Left Toe Second Dry Gauze Wound #18 Left,Medial Foot Dry Gauze ABD pad Kerramax Wound #19  Left,Circumferential Lower Leg Dry Gauze ABD pad Kerramax Wound #20 Right Toe Great Dry Gauze Wound #21 Right Toe Second Dry Gauze Wound #22 Right Toe Third Dry Gauze Wound #3R Right,Posterior Calf Dry Gauze Edema Control Unna Boots Bilaterally Off-Loading Open toe surgical shoe to: - left and right foot Additional Orders / Instructions Other: - ok to leave right toes dressings and right leg dressing in place since applied yesterday. No need to redress these in clinic today. Only dress the left leg and left second toe. Electronic Signature(s) Signed: 10/05/2019 5:19:45 PM By: Deon Pilling Signed: 10/06/2019 4:55:01 AM By: [name] MD Entered By: Deon Pilling on 10/05/2019 15:11:37 -------------------------------------------------------------------------------- Problem List Details Patient Name: Date of Service: [name], [name] 10/05/2019 1:45 PM Medical Record [id number] Patient Account Number: [id number] Date of Birth/Sex: Treating RN: [birth year] (79 y.o. [name] Primary Care Provider: Hurshel Party Other Clinician: Referring Provider: Treating Provider/Extender:Napoleon Monacelli, Cheryl Flash, [facility] Weeks in Treatment: 31 Active Problems ICD-10 Evaluated Encounter Code Description Active Date Today Diagnosis E11.51 Type 2 diabetes mellitus with diabetic peripheral 02/28/2019 No Yes angiopathy without gangrene L97.211 Non-pressure chronic ulcer of right calf limited to 02/28/2019 No Yes breakdown of skin E11.42 Type 2 diabetes mellitus with diabetic polyneuropathy 02/28/2019 No Yes L97.221 Non-pressure chronic ulcer of left calf limited to 02/28/2019 No Yes breakdown of skin I87.333 Chronic venous hypertension (idiopathic) with ulcer 02/28/2019 No Yes and inflammation of bilateral lower extremity L97.521 Non-pressure chronic ulcer of  other part of left foot 09/26/2019 No Yes limited to breakdown of skin L97.511 Non-pressure chronic ulcer of other part of right  foot 09/26/2019 No Yes limited to breakdown of skin L89.37 Chronic systolic (congestive) heart failure 10/05/2019 No Yes Inactive Problems ICD-10 Code Description Active Date Inactive Date B35.3 Tinea pedis 09/05/2019 09/05/2019 Resolved Problems Electronic Signature(s) Signed: 10/06/2019 4:55:01 AM By: [name] MD Entered By: [name] on 10/05/2019 16:03:26 -------------------------------------------------------------------------------- Progress Note Details Patient Name: Date of Service: [name] 10/05/2019 1:45 PM Medical Record [id number] Patient Account Number: [id number] Date of Birth/Sex: Treating RN: Nov 02, 1940 (79 y.o. M) Primary Care Provider: Hurshel Party Other Clinician: Referring Provider: Treating Provider/Extender:Derryl Uher, Cheryl Flash, Florien Weeks in Treatment: 31 Subjective History of Present Illness (HPI) The following HPI elements were documented for the patient's wound: Location: Patient presents with a wound to left lower leg. Quality: Patient reports No Pain. Duration: 2 months no cig or alcohol. spontaneous appearance in area of stasis dermamtitis. d.m. on metformin only. chronic afib on Coumadin. diabetes and coag studies not good. hba1c 7.5. ivr 4.5. no pain or sxs of systemic disease. hx chf. no intermittent claudication 02/28/2019 Readmission This is a now a 79 year old man who was previously cared for in 2016 by Dr. Lindon Romp for wounds on his lower extremities. At that point he had venous reflux studies although I cannot seem to open these in  link. He had arterial studies showing an ABI of 1.11 on the right and 1.27 on the left his waveforms were triphasic bilaterally. He was discharged in stockings although I do not believe he is wearing these in some time. He tells me that about a month ago he noted openings of a large wound on the posterior right calf and 2 smaller areas on the left lateral calf and a small area  more recently on the left posterior calf. He has been dressing these with peroxide and triple antibiotic ointment. He is not wearing compression. Past medical history; type 2 diabetes with peripheral neuropathy, chronic venous insufficiency, hypertension, cardiomyopathy, chronic atrial fibrillation on Coumadin, prostate cancer, hyperlipidemia, gout, ABI in our clinic was 1.34 on the left and not obtainable on the right 6/9; this is a patient who has chronic venous insufficiency. He has a fairly substantial area on the right posterior calf, left lateral calf and a small area on the left posterior calf. On arrival last week he had very palpable popliteal and femoral pulses but nothing in his bilateral feet. Unfortunately we cannot get arterial studies until July 1 at Dr. Kennon Holter office. They live in [place]. We use silver alginate under Kerlix Coban 6/16; patient with chronic venous insufficiency with wounds on his bilateral lower extremities. When he came into our clinic he was discovered to have a complete absence of peripheral pedal pulses at either the dorsalis pedis or posterior tibial. He does have easily palpable femoral and popliteal pulses. He sees Dr. [name] tomorrow for noninvasive arterial tests. He may also require venous reflux evaluation although I do not view this as an urgent thing. We have been using silver alginate. His wound surfaces of cleaned up quite nicely 6/23; patient with chronic venous insufficiency with wounds on his bilateral lower extremities. His wounds all are somewhat better looking. He did go to Dr. Kennon Holter office but somehow ended up on the doctors schedule rather than being scheduled for noninvasive tests therefore his noninvasive tests are scheduled for July 1. We agree that he has venous insufficiency ulcers but I  cannot feel any pulses in his lower extremities dictating the need for test. We are only using Kerlix and light Coban unfortunately this appears to  be holding the edema 6/30; has his arterial studies tomorrow. We have been using Kerlix and light Coban will go to a more aggressive compression if the arterial studies will allow. We all agreed these are venous wounds however I cannot feel pulses at either the dorsalis pedis or posterior tibial bilaterally. His wounds generally look some better including left lateral and right posterior. 7/7-Patient returns at 1 week in Kerlix/Coban to both legs, with improvement, in the left lateral and right posterior lower leg wounds, ABI's are normal in both legs per vascular studies, TBI is also normal on both sides, we are using hydrofera blue to the wounds 7/14; patient's arterial studies from 2 weeks ago showed an ABI on the right at 1.03 with a TBI of 0.86. On the left the ABI was 1.06 with a TBI of 0.84. Notable for the fact that his arterial waveforms were monophasic in all of the lower extremity arteries suggesting some degree of arterial occlusive disease but in general this was felt to be fairly adequate for healing. His compression was increased from 2-3 layer which is appropriate. Dressing was changed to [facility] 7/21; patient's wounds are measuring smaller. The more substantial one on the right posterior calf, second 1 on the left lateral calf. Using [facility] on both wound areas 7/28; patient continues to make nice improvements. The area on the right posterior calf is smaller. Area on the left lateral calf also is smaller. We have been using Hydrofera Blue under compression. The patient will need compression stockings and we have measured him for these in the eventuality that these heal which really should not be too long from now 8/4-Patient continues to make improvement, the right posterior calf area smaller with rim of keratotic skin on one side, the left wound is definitely smaller and improving. 8/11-Returns at 1 week, after being in 3 layer compression on both legs, both  wounds appear to be improving, making good progress, patient is happy, pain is also less especially in the right leg wound 8/18; the area on the left anterior lower leg is healed. On the right posterior leg the wound remains although the dimensions are a lot better. 8/25; he arrives in clinic today with a large body of open wound on the left lateral calf. All of the 3 wounds in this area are in close juxtaposition to each other. The story is that we discharged him last week with no a wrap on the left leg. They went to [facility] could not get in as they are only excepting phone orders or online orders for stockings hence they did not put any stocking on the left leg all week. They have something at home but the patient with that was either incapable or just did not put them on. Apparently these opened 1 morning after getting out of bed. The area on the right has no real change 9/1; patient has bilateral lower extremity wounds in the setting of severe chronic venous insufficiency and secondary lymphedema. He arrived last week with new areas on the left lateral lower leg after we did not wrap him and he did not use his stockings. Nevertheless the areas on the left look better today under compression. Posterior right calf does not really changed. We are using Hydrofera Blue on both areas under compression 9/15; bilateral lower extremity wounds in  the setting of severe chronic venous insufficiency and secondary lymphedema. He has 20 to 30 mmHg below-knee compression stockings under the eventuality that these close over. We did get the left leg to close but he did not transition to a stocking and this reopened. There are 2 open areas on the left posterior lateral calf and one on the right. Both of these look satisfactory. Using [facility] 9/22; bilateral lower extremity wounds in the setting of chronic venous insufficiency. 2 superficial areas on the left lateral calf. One on the right just above the  Achilles area. We have good edema control we have been using Hydrofera Blue 9/29; the areas on the left lateral calf are healed. On the right just above the Achilles and tendon area things look a lot better small wounds one scabbed area. We have been using Hydrofera Blue. We can discharge him in his own stocking on the left still wrapping on the right. This is the second time we have healed the left leg but he did not put a stocking on last time. Hopefully this will maintain the edema from chronic venous disease with secondary lymphedema 10/6; he comes in today having a stocking on the left leg. They had trouble getting it on there is a lot of increase in swelling 2 small open areas one anteriorly and one on the medial calf. They report a lot of difficulty getting the stocking on. ooParadoxically the area on the right that we have been wrapping posteriorly is closed 10/13; he comes in today with wounds bilaterally including superficial areas on the left medial and left lateral calf. As well as the right posterior has reopened in the Achilles area superiorly. He still does not have his juxta lite stockings although truthfully we would not of been able to use them today anyway. Apparently have been ordered and paid for from [employer] although they have not been delivered 10/20; his area on the right is just the boat closed on the right posterior. Still has the area on the left lateral and a very tiny area on the left medial. He has his bilateral juxta lites although he is not ready for them this week. He tolerated the increase to 4 layer compression last week quite well 10/27; the area on the right posterior calf is once again closed. He has a superficial area on the left lateral calf that is still open. He has been using Hydrofera Blue and bilateral 4-layer compression. He can change to his own juxta lite stocking on the right and we are instructing him today 11/3; the area on the right posterior  calf reopened according to the patient and his wife after they took off the stocking when they got home last week. Apparently scabbed over there is now a fairly substantial wound which looks pretty much the same. Our intake nurse noted that they were using the juxta lite stockings appropriately. I was really hoping I might be able to close him out today. He has 1 very tiny remaining area on the left lateral lower leg. 11/10; right posterior calf wound measures smaller but is still open. We have been using Hydrofera Blue. On the left he has a small oval-shaped wound and he seems to have had another wound distally that is open and likely a blister. We are using Hydrofera Blue under compression 11/17; right posterior calf wound continues to get better. We have been using Hydrofera Blue. On the left lateral one of the wounds has closed still a  small open area. We have been using Hydrofera Blue on this as well. Both areas have been under 4-layer compression Arrives in clinic today with some swelling in the dorsal foot on the right some erythema of his forefoot and toes. Initially when I looked at this I almost thought this was a sunburn distal to a wrap injury. 12/1; right posterior calf wound debrided with a curette. We have been using Hydrofera Blue on the left anterior lateral he has an area across the mid tibia. Finally a small area on the left lateral lower calf. Finally he continues to have de-epithelialized areas on the dorsal aspect of his toes. Initially thought this might be a burn injury when I saw him 2 weeks ago. I now wonder about tinea. I have also reviewed his arterial studies which were really quite good in July/20 with normal TBI's and ABIs but monophasic waveforms 12/8; comes in today with worsening problems especially on the left leg where he now has a cluster of wounds in the left anterior mid tibia. Very poor edema control. I reduced him to 3 layer from 4 layer compression last  week because of some concern about blood flow to his toes however he does not have good edema control on the left leg. Right leg edema control looks satisfactory. ooOn the left he has a cluster of wounds anteriorly, small area on the left medial fifth met head and then the collection of areas on his toes which appear better ooOn the right he has the original area on the right posterior calf, a new area right medially. His formal arterial studies from mid July are noted below. He was evaluated by Dr.Berry ABI Findings: +---------+------------------+-----+----------+--------+ Right Rt Pressure (mmHg)IndexWaveform Comment  +---------+------------------+-----+----------+--------+ Brachial 176     +---------+------------------+-----+----------+--------+ ATA 176 0.99 monophasic  +---------+------------------+-----+----------+--------+ PTA 183 1.03 monophasic  +---------+------------------+-----+----------+--------+ PERO 172 0.97 monophasic  +---------+------------------+-----+----------+--------+ Great Toe153 0.86    +---------+------------------+-----+----------+--------+ +---------+------------------+-----+-----------+-------+ Left Lt Pressure (mmHg)IndexWaveform Comment +---------+------------------+-----+-----------+-------+ Brachial 178     +---------+------------------+-----+-----------+-------+ ATA 162 0.91 multiphasic  +---------+------------------+-----+-----------+-------+ PTA 188 1.06 multiphasic  +---------+------------------+-----+-----------+-------+ PERO 158 0.89 monophasic   +---------+------------------+-----+-----------+-------+ Great Toe150 0.84    +---------+------------------+-----+-----------+-------+ +-------+-----------+-----------+------------+------------+ ABI/TBIToday's ABIToday's TBIPrevious ABIPrevious TBI +-------+-----------+-----------+------------+------------+ Right 1.03  0.86 1.11   +-------+-----------+-----------+------------+------------+ Left 1.06 0.84 1.27   +-------+-----------+-----------+------------+------------+ Tibial waveforms somewhat difficult to record due to irregular heartbeat. Bilateral ABIs appear essentially unchanged compared to prior study on 06/21/15. Summary: Right: Resting right ankle-brachial index is within normal range. No evidence of significant right lower extremity arterial disease. The right toe-brachial index is normal. Although ankle brachial indices are within normal limits (0.95-1.29), arterial Doppler waveforms at the ankle suggest some component of arterial occlusive disease. Left: Resting left ankle-brachial index is within normal range. No evidence of significant left lower extremity arterial disease. The left toe-brachial index is normal. Although ankle brachial indices are within normal limits (0.95-1.29), arterial Doppler waveforms at the ankle suggest some component of arterial occlusive disease. 12/15; the patient's area on the left mid tibia looks better. Right posterior calf also better. He has the area on the left foot as well. All of his toes look better I think this was tinea. We are using Hydrofera Blue everywhere else The patient was in urgent care yesterday with wheezing and shortness of breath. He got azithromycin and prednisone. He feels better. His lungs are currently clear to auscultation. He was not tested for Covid 19 12/29; the patient arrives in clinic today with quite a bit change. 2 weeks ago  he only had areas on the left mid tibia right posterior calf with tinea pedis resolving between his toes. He arrives in clinic today with several areas on the dorsal toes on the right, dorsal left second toe. He has skin breakdown in the left medial calf probably from excess edema. Small area proximally in the medial calf. He has weeping edema fluid coming out of the skin excoriations on the left  medial calf. He tells me that he is having a cardiac catheterization next week. I had a quick look at [facility] health link. He was found to have an ejection fraction of 25% during the work-up for persistent atrial fibrillation. He saw his cardiology office yesterday seen by the nurse practitioner. She increased his carvedilol. He has not been on diuretics for apparently several months and indeed in the nurse practitioner Dietrich Pates notes she had knowledge of this. 10/04/2019 on evaluation today patient presents as a walk-in visit concerning the fact that he did not have an appointment here for our clinic at this point. He actually had a cardiac catheterization earlier today and then came from there to here in order to be evaluated. With that being said unfortunately he is having significant cellulitis of his left lower extremity upon evaluation today this appears to have deteriorated even since last week's evaluation with Dr. Dellia Nims. The right lower extremity is actually doing okay I really see no evidence of deterioration at this point at those locations. In fact the right leg seems in general be doing quite well. Nonetheless I am concerned about infection and cellulitis of the left lower extremity and again considering his weakened heart I do not want him to develop into sepsis at all. He is also having some trouble breathing today and I understand according to nursing staff this is always the case to some degree. With that being said the patient unfortunately seems to be in my opinion a little bit worse even his wife feels like that may be the case today. Unsure exactly what is leading to this. Cardiac catheterization I did review the report which showed an ejection fraction of 25% he also had an LAD blockage of around 25% based on what I saw. With that being said there was no significant blockages that required stenting at this point he does have weakened cardiac muscles compared to  normal. 10/05/2019; patient was seen yesterday in clinic. He was sent to the ER because of cellulitis of the left leg possibility. In the ER he was given 1 dose of IV Levaquin and discharged on Keflex. He came in the clinic initially for a nurse visit to rewrap his left leg. We did not look at the right leg today that is an Haematologist. The patient also had a cardiac cath. According to him there were no blockages but a very low ejection fraction. Objective Constitutional Sitting or standing Blood Pressure is within target range for patient.. Pulse regular and within target range for patient.Marland Kitchen Respirations regular, non-labored and within target range.. Temperature is normal and within the target range for the patient.Marland Kitchen Appears in no distress. Vitals Time Taken: 2:24 PM, Height: 74 in, Weight: 212 lbs, BMI: 27.2, Temperature: 98.1 F, Pulse: 79 bpm, Respiratory Rate: 22 breaths/min, Blood Pressure: 132/79 mmHg. Eyes Conjunctivae clear. No discharge.no icterus. Respiratory work of breathing is normal. Bilateral breath sounds are clear and equal in all lobes with no wheezes, rales or rhonchi.. Cardiovascular A. fib with a controlled ventricular rate. JVP is borderline. No sacral  edema. 3+ pitting edema in the left calf.. Gastrointestinal (GI) Abdomen is soft and non-distended without masses or tenderness.Marland Kitchen Psychiatric appears at normal baseline. General Notes: Wound exam; I did not look at the patient's right leg today. ooMultiple superficial wounds in the dorsal left foot and in the calf anteriorly laterally and medially. He has dusky erythema but no warmth or acute tenderness. Integumentary (Hair, Skin) Wound #13 status is Open. Original cause of wound was Gradually Appeared. The wound is located on the Left Toe Second. The wound measures 1.5cm length x 1.3cm width x 0.1cm depth; 1.532cm^2 area and 0.153cm^3 volume. There is Fat Layer (Subcutaneous Tissue) Exposed exposed. There is no  tunneling or undermining noted. There is a large amount of serosanguineous drainage noted. The wound margin is distinct with the outline attached to the wound base. There is no granulation within the wound bed. There is a large (67-100%) amount of necrotic tissue within the wound bed including Adherent Slough. Wound #18 status is Open. Original cause of wound was Gradually Appeared. The wound is located on the Left,Medial Foot. The wound measures 0.3cm length x 0.3cm width x 0.1cm depth; 0.071cm^2 area and 0.007cm^3 volume. There is no tunneling or undermining noted. There is a none present amount of drainage noted. The wound margin is distinct with the outline attached to the wound base. There is no granulation within the wound bed. There is a large (67-100%) amount of necrotic tissue within the wound bed including Adherent Slough. Wound #19 status is Open. Original cause of wound was Gradually Appeared. The wound is located on the Left,Circumferential Lower Leg. The wound measures 26cm length x 24cm width x 0.1cm depth; 490.088cm^2 area and 49.009cm^3 volume. There is Fat Layer (Subcutaneous Tissue) Exposed exposed. There is no tunneling or undermining noted. There is a large amount of serosanguineous drainage noted. The wound margin is distinct with the outline attached to the wound base. There is medium (34-66%) red, pink granulation within the wound bed. There is a medium (34-66%) amount of necrotic tissue within the wound bed including Adherent Slough. 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 Non-pressure chronic ulcer of left calf limited to breakdown of skin Chronic venous hypertension (idiopathic) with ulcer and inflammation of bilateral lower extremity Non-pressure chronic ulcer of other part  of left foot limited to breakdown of skin Non-pressure chronic ulcer of other part of right foot limited to breakdown of skin Chronic systolic (congestive) heart failure Procedures Wound #18 Pre-procedure diagnosis of Wound #18 is a Diabetic Wound/Ulcer of the Lower Extremity located on the Left,Medial Foot . There was a Haematologist Compression Therapy Procedure by Baruch Gouty, RN. Post procedure Diagnosis Wound #18: Same as Pre-Procedure Wound #19 Pre-procedure diagnosis of Wound #19 is a Venous Leg Ulcer located on the Left,Circumferential Lower Leg . There was a Haematologist Compression Therapy Procedure by Baruch Gouty, RN. Post procedure Diagnosis Wound #19: Same as Pre-Procedure Plan Follow-up Appointments: Return Appointment in 1 week. - Tuesday Dressing Change Frequency: Wound #13 Left Toe Second: Change dressing every day. Wound #18 Left,Medial Foot: Do not change entire dressing for one week. Wound #19 Left,Circumferential Lower Leg: Do not change entire dressing for one week. Wound #3R Right,Posterior Calf: Do not change entire dressing for one week. Skin Barriers/Peri-Wound Care: Wound #18 Left,Medial Foot: TCA Cream or Ointment Wound Cleansing: May shower  with protection. Primary Wound Dressing: Wound #13 Left Toe Second: Calcium Alginate with Silver Wound #18 Left,Medial Foot: Calcium Alginate with Silver Wound #19 Left,Circumferential Lower Leg: Calcium Alginate with Silver Wound #20 Right Toe Great: Calcium Alginate with Silver Wound #21 Right Toe Second: Calcium Alginate with Silver Wound #22 Right Toe Third: Calcium Alginate with Silver Wound #3R Right,Posterior Calf: Iodoflex Secondary Dressing: Wound #13 Left Toe Second: Dry Gauze Wound #18 Left,Medial Foot: Dry Gauze ABD pad Kerramax Wound #19 Left,Circumferential Lower Leg: Dry Gauze ABD pad Kerramax Wound #20 Right Toe Great: Dry Gauze Wound #21 Right Toe Second: Dry Gauze Wound #22  Right Toe Third: Dry Gauze Wound #3R Right,Posterior Calf: Dry Gauze Edema Control: Unna Boots Bilaterally Off-Loading: Open toe surgical shoe to: - left and right foot Additional Orders / Instructions: Other: - ok to leave right toes dressings and right leg dressing in place since applied yesterday. No need to redress these in clinic today. Only dress the left leg and left second toe. 1. The patient has a deep punched open wound on the left second toe with exposed bone. We will use silver alginate on this. 2. Silver alginate on the multiple wounds on his left leg. I am not convinced about the cellulitis I think this is brawny edema from stasis dermatitis although I cannot argue with the fact that this seems more erythematous. He is on an antibiotic [Keflex]. He is going to need compression we will need to see him early next week. to check the status of his leg 3. Presumably nonischemic cardiomyopathy. He seems short of breath. When he was here last time when I saw him I had to call cardiology to increase his diuretic. Yet his exam at the bedside today was reasonably benign now although he still looks dyspneic. All look over and see whether he has had a recent chest x-ray. 4 the patient has a probing wound over the PIP of the left second toe that has exposed bone.o Osteomyelitis 5 Follow-up on Monday or Tuesday next week for review of the left leg Electronic Signature(s) Signed: 10/06/2019 4:55:01 AM By: [name] MD Entered By: [name] on 10/05/2019 16:14:40 -------------------------------------------------------------------------------- SuperBill Details Patient Name: Date of Service: [name] 10/05/2019 Medical Record [id number] Patient Account Number: [id number] Date of Birth/Sex: Treating RN: 08-23-1941 (79 y.o. 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 L97.221 Non-pressure chronic ulcer of left calf limited to breakdown of skin I87.333 Chronic venous hypertension (idiopathic) with ulcer and inflammation of bilateral lower extremity B35.3 Tinea pedis L97.521 Non-pressure chronic ulcer of other part of left foot limited to breakdown of skin L97.511 Non-pressure chronic ulcer of other part of right foot limited to breakdown of skin Facility Procedures The patient participates with Medicare or their insurance follows the Medicare Facility Guidelines: CPT4 Code Description Modifier Quantity [id number] (Facility Use Only) 29580LT - [facility] 1 Physician Procedures Electronic Signature(s) Signed: 10/06/2019 4:55:01 AM By: [name] MD Entered By: [name] on 10/05/2019 16:12:38

## 2019-10-06 NOTE — Telephone Encounter (Signed)
Per wife, she has already spoke to staff about question.

## 2019-10-06 NOTE — Telephone Encounter (Signed)
Pt wife wanted to make sure it was ok to take tape/bandage off of cath site - per d/c summary its has been 48 hours since cath and pt wife aware that ok to remove and clean with soup water (gently) made aware to looks for redness/puss/swelling/warmness on site as this could indicate infection. Voiced understanding and appreciative

## 2019-10-09 ENCOUNTER — Ambulatory Visit (INDEPENDENT_AMBULATORY_CARE_PROVIDER_SITE_OTHER): Payer: Medicare HMO | Admitting: *Deleted

## 2019-10-09 ENCOUNTER — Other Ambulatory Visit: Payer: Self-pay

## 2019-10-09 ENCOUNTER — Encounter: Payer: Medicare HMO | Admitting: *Deleted

## 2019-10-09 DIAGNOSIS — I4821 Permanent atrial fibrillation: Secondary | ICD-10-CM | POA: Diagnosis not present

## 2019-10-09 DIAGNOSIS — I5022 Chronic systolic (congestive) heart failure: Secondary | ICD-10-CM

## 2019-10-09 DIAGNOSIS — Z5181 Encounter for therapeutic drug level monitoring: Secondary | ICD-10-CM

## 2019-10-09 NOTE — Patient Instructions (Signed)
1/6  Day of cath INR was 1.6  Had been off warfarin x 4 days Take warfarin 1 1/2 tablets tonight then resume 1 tablet daily except 1/2 tablet on Saturdays Recheck in 1 week

## 2019-10-09 NOTE — Patient Instructions (Addendum)
error 

## 2019-10-10 ENCOUNTER — Other Ambulatory Visit (HOSPITAL_COMMUNITY): Payer: Self-pay | Admitting: Internal Medicine

## 2019-10-10 ENCOUNTER — Encounter (HOSPITAL_BASED_OUTPATIENT_CLINIC_OR_DEPARTMENT_OTHER): Payer: Medicare HMO | Admitting: Internal Medicine

## 2019-10-10 ENCOUNTER — Ambulatory Visit (HOSPITAL_COMMUNITY)
Admission: RE | Admit: 2019-10-10 | Discharge: 2019-10-10 | Disposition: A | Discharge status: Home / Self Care | Payer: Medicare HMO | Source: Ambulatory Visit | Attending: Internal Medicine | Admitting: Internal Medicine

## 2019-10-10 DIAGNOSIS — M879 Osteonecrosis, unspecified: Secondary | ICD-10-CM | POA: Insufficient documentation

## 2019-10-10 DIAGNOSIS — L97524 Non-pressure chronic ulcer of other part of left foot with necrosis of bone: Secondary | ICD-10-CM | POA: Diagnosis not present

## 2019-10-10 DIAGNOSIS — L97211 Non-pressure chronic ulcer of right calf limited to breakdown of skin: Secondary | ICD-10-CM | POA: Diagnosis not present

## 2019-10-10 DIAGNOSIS — S91302A Unspecified open wound, left foot, initial encounter: Secondary | ICD-10-CM | POA: Diagnosis not present

## 2019-10-10 DIAGNOSIS — I872 Venous insufficiency (chronic) (peripheral): Secondary | ICD-10-CM | POA: Diagnosis not present

## 2019-10-10 DIAGNOSIS — T148XXA Other injury of unspecified body region, initial encounter: Secondary | ICD-10-CM | POA: Insufficient documentation

## 2019-10-10 DIAGNOSIS — E1151 Type 2 diabetes mellitus with diabetic peripheral angiopathy without gangrene: Secondary | ICD-10-CM | POA: Diagnosis not present

## 2019-10-10 DIAGNOSIS — S81802A Unspecified open wound, left lower leg, initial encounter: Secondary | ICD-10-CM | POA: Diagnosis not present

## 2019-10-10 DIAGNOSIS — L97511 Non-pressure chronic ulcer of other part of right foot limited to breakdown of skin: Secondary | ICD-10-CM | POA: Diagnosis not present

## 2019-10-10 DIAGNOSIS — E11621 Type 2 diabetes mellitus with foot ulcer: Secondary | ICD-10-CM | POA: Diagnosis not present

## 2019-10-10 DIAGNOSIS — L03116 Cellulitis of left lower limb: Secondary | ICD-10-CM | POA: Diagnosis not present

## 2019-10-10 DIAGNOSIS — S91105A Unspecified open wound of left lesser toe(s) without damage to nail, initial encounter: Secondary | ICD-10-CM | POA: Diagnosis not present

## 2019-10-10 DIAGNOSIS — E1142 Type 2 diabetes mellitus with diabetic polyneuropathy: Secondary | ICD-10-CM | POA: Diagnosis not present

## 2019-10-10 DIAGNOSIS — I890 Lymphedema, not elsewhere classified: Secondary | ICD-10-CM | POA: Diagnosis not present

## 2019-10-10 DIAGNOSIS — L97221 Non-pressure chronic ulcer of left calf limited to breakdown of skin: Secondary | ICD-10-CM | POA: Diagnosis not present

## 2019-10-10 NOTE — Progress Notes (Signed)
JACHOB, MCCLEAN ([id number]) Visit Report for 10/05/2019 Arrival Information Details Patient Name: Date of Service: [name], [name] 10/05/2019 1:45 PM Medical Record [id number] Patient Account Number: [id number] Date of Birth/Sex: Treating RN: January 17, 1941 (79 y.o. [name] Primary Care Leoma Folds: Hurshel Party Other Clinician: Referring Aleigha Gilani: Treating Kiaria Quinnell/Extender:Robson, Cheryl Flash, [place] Weeks in Treatment: 50 Visit Information History Since Last Visit Added or deleted any medications: No Patient Arrived: Wheel Chair Any new allergies or adverse reactions: No Arrival Time: 14:20 Had a fall or experienced change in No Accompanied By: wife activities of daily living that may affect Transfer Assistance: None risk of falls: Patient Identification Verified: Yes Signs or symptoms of abuse/neglect since last visito No Secondary Verification Process Yes Hospitalized since last visit: No Completed: Implantable device outside of the clinic excluding No Patient Requires Transmission- No cellular tissue based products placed in the center Based Precautions: since last visit: Patient Has Alerts: Yes Has Dressing in Place as Prescribed: No Patient Alerts: Patient on Blood Pain Present Now: No Thinner Electronic Signature(s) Signed: 10/10/2019 6:18:44 PM By: Levan Hurst RN, BSN Entered By: Levan Hurst on 10/05/2019 14:20:53 -------------------------------------------------------------------------------- Compression Therapy Details Patient Name: Date of Service: Daniel Pia D. 10/05/2019 1:45 PM Medical Record [id number] Patient Account Number: [id number] Date of Birth/Sex: Treating RN: 1940-11-18 (79 y.o. [name] Primary Care Hildegard Hlavac: Hurshel Party Other Clinician: Referring Keiondre Colee: Treating Jaslin Novitski/Extender:Robson, Cheryl Flash, [place] Weeks in Treatment: 31 Compression Therapy Performed for Wound Wound #19  Left,Circumferential Lower Leg Assessment: Performed By: Clinician Baruch Gouty, RN Compression Type: Rolena Infante Post Procedure Diagnosis Same as Pre-procedure Electronic Signature(s) Signed: 10/05/2019 5:19:45 PM By: Deon Pilling Entered By: Deon Pilling on 10/05/2019 15:12:12 -------------------------------------------------------------------------------- Compression Therapy Details Patient Name: Date of Service: [name] 10/05/2019 1:45 PM Medical Record [id number] Patient Account Number: [id number] Date of Birth/Sex: Treating RN: [birth year] (80 y.o. [name] Primary Care Lilyonna Steidle: Hurshel Party Other Clinician: Referring Vickie Melnik: Treating Alaylah Heatherington/Extender:Robson, Cheryl Flash, [place] Weeks in Treatment: 31 Compression Therapy Performed for Wound Wound #18 Left,Medial Foot Assessment: Performed By: Clinician Baruch Gouty, RN Compression Type: Rolena Infante Post Procedure Diagnosis Same as Pre-procedure Electronic Signature(s) Signed: 10/05/2019 5:19:45 PM By: Deon Pilling Entered By: Deon Pilling on 10/05/2019 15:12:12 -------------------------------------------------------------------------------- Encounter Discharge Information Details Patient Name: Date of Service: Daniel Pia D. 10/05/2019 1:45 PM Medical Record [id number] Patient Account Number: [id number] Date of Birth/Sex: Treating RN: [birth year] (79 y.o. [name] Primary Care Anikin Prosser: Hurshel Party Other Clinician: Referring Ritta Hammes: Treating Javyn Havlin/Extender:Robson, Cheryl Flash, [place] Weeks in Treatment: 88 Encounter Discharge Information Items Discharge Condition: Stable Ambulatory Status: Wheelchair Discharge Destination: Home Transportation: Private Auto Accompanied By: spouse Schedule Follow-up Appointment: Yes Clinical Summary of Care: Patient Declined Electronic Signature(s) Signed: 10/05/2019 5:27:53 PM By: Baruch Gouty RN, BSN Entered By:  Baruch Gouty on 10/05/2019 15:47:55 -------------------------------------------------------------------------------- Lower Extremity Assessment Details Patient Name: Date of Service: [name], [name] 10/05/2019 1:45 PM Medical Record [id number] Patient Account Number: [id number] Date of Birth/Sex: Treating RN: 07/22/1941 (79 y.o. [name] Primary Care Ameliana Brashear: Hurshel Party Other Clinician: Referring Kjersten Ormiston: Treating [name] Sculley/Extender:Robson, Cheryl Flash, [place] Weeks in Treatment: 31 Edema Assessment Assessed: [Left: No] [Right: No] Edema: [Left: Yes] [Right: Yes] Calf Left: Right: Point of Measurement: 31 cm From Medial Instep 32.8 cm 31 cm Ankle Left: Right: Point of Measurement: 11 cm From Medial Instep 23.2 cm 21 cm Vascular Assessment Pulses: Dorsalis Pedis Palpable: [Left:Yes] Electronic Signature(s) Signed: 10/10/2019 6:18:44 PM By: Levan Hurst RN, BSN Entered By: Levan Hurst on  10/05/2019 14:29:55 -------------------------------------------------------------------------------- Multi Wound Chart Details Patient Name: Date of Service: [name], [name] 10/05/2019 1:45 PM Medical Record [id number] Patient Account Number: [id number] Date of Birth/Sex: Treating RN: [birth year] (79 y.o. M) Primary Care Sabrine Patchen: Hurshel Party Other Clinician: Referring Korissa Horsford: Treating Aashka Salomone/Extender:Robson, Cheryl Flash, [place] Weeks in Treatment: 31 Vital Signs Height(in): 74 Pulse(bpm): 42 Weight(lbs): 212 Blood Pressure(mmHg): 132/79 Body Mass Index(BMI): 27 Temperature(F): 98.1 Respiratory 22 Rate(breaths/min): Photos: [13:No Photos] [18:No Photos] [19:No Photos] Wound Location: [13:Left Toe Second] [18:Left Foot - Medial] [19:Left Lower Leg - Circumferential] Wounding Event: [13:Gradually Appeared] [18:Gradually Appeared] [19:Gradually Appeared] Primary Etiology: [13:Diabetic Wound/Ulcer of the Diabetic Wound/Ulcer of the Venous  Leg Ulcer Lower Extremity] [18:Lower Extremity] Comorbid History: [13:Cataracts, Hypertension, Cataracts, Hypertension, Cataracts, Hypertension, Peripheral Venous Disease, Peripheral Venous Disease, Peripheral Venous Disease, Type II Diabetes, Gout, Received Radiation] [18:Type II Diabetes, Gout,  Received Radiation] [19:Type II Diabetes, Gout, Received Radiation] Date Acquired: [13:08/22/2019] [18:09/03/2019] [19:09/23/2019] Weeks of Treatment: [13:6] [18:4] [19:1] Wound Status: [13:Open] [18:Open] [19:Open] Clustered Wound: [13:No] [18:No] [19:Yes] Clustered Quantity: [13:N/A] [18:N/A] [19:15] Measurements L x W x D 1.5x1.3x0.1 [18:0.3x0.3x0.1] [19:26x24x0.1] (cm) Area (cm) : [13:1.532] [18:0.071] [19:490.088] Volume (cm) : [13:0.153] [18:0.007] [19:49.009] % Reduction in Area: [13:50.20%] [18:93.00%] [19:-814.30%] % Reduction in Volume: 50.30% [18:93.10%] [19:-814.30%] Classification: [13:Grade 1] [18:Grade 2] [19:Full Thickness Without Exposed Support Structures] Exudate Amount: [13:Large] [18:None Present] [19:Large] Exudate Type: [13:Serosanguineous] [18:N/A] [19:Serosanguineous] Exudate Color: [13:red, brown] [18:N/A] [19:red, brown] Wound Margin: [13:Distinct, outline attached Distinct, outline attached Distinct, outline attached] Granulation Amount: [13:None Present (0%)] [18:None Present (0%)] [19:Medium (34-66%)] Granulation Quality: [13:N/A] [18:N/A] [19:Red, Pink] Necrotic Amount: [13:Large (67-100%)] [18:Large (67-100%)] [19:Medium (34-66%)] Exposed Structures: [13:Fat Layer (Subcutaneous Fascia: No Tissue) Exposed: Yes Fascia: No Tendon: No Muscle: No Joint: No Bone: No] [18:Fat Layer (Subcutaneous Tissue) Exposed: Yes Tissue) Exposed: No Tendon: No Muscle: No Joint: No Bone: No] [19:Fat Layer (Subcutaneous  Fascia: No Tendon: No Muscle: No Joint: No Bone: No] Epithelialization: [13:None] [18:None Compression Therapy] [19:None Compression Therapy] Treatment Notes Wound #13  (Left Toe Second) 3. Primary Dressing Applied Calcium Alginate Ag 4. Secondary Dressing Roll Gauze Wound #19 (Left, Circumferential Lower Leg) 2. Periwound Care Moisturizing lotion TCA Cream 3. Primary Dressing Applied Calcium Alginate Ag 4. Secondary Dressing ABD Pad Dry Gauze Other secondary dressing (specify in notes) 6. Support Layer Production assistant, radio Notes Academic librarian) Signed: 10/06/2019 4:55:01 AM By: [name] MD Entered By: [name] on 10/05/2019 16:04:09 -------------------------------------------------------------------------------- Multi-Disciplinary Care Plan Details Patient Name: Date of Service: [name], [name] 10/05/2019 1:45 PM Medical Record [id number] Patient Account Number: [id number] Date of Birth/Sex: Treating RN: 11-24-1940 (79 y.o. [name] Primary Care Cheyanna Strick: Hurshel Party Other Clinician: Referring Librada Castronovo: Treating Melanye Hiraldo/Extender:Robson, Cheryl Flash, [place] Weeks in Treatment: 59 Active Inactive Venous Leg Ulcer Nursing Diagnoses: Knowledge deficit related to disease process and management Potential for venous Insuffiency (use before diagnosis confirmed) Goals: Patient will maintain optimal edema control Date Initiated: 10/04/2019 Target Resolution Date: 11/01/2019 Goal Status: Active Interventions: Assess peripheral edema status every visit. Compression as ordered Treatment Activities: Therapeutic compression applied : 10/04/2019 Notes: Wound/Skin Impairment Nursing Diagnoses: Knowledge deficit related to ulceration/compromised skin integrity Goals: Patient/caregiver will verbalize understanding of skin care regimen Date Initiated: 02/28/2019 Target Resolution Date: 11/01/2019 Goal Status: Active Ulcer/skin breakdown will have a volume reduction of 30% by week 4 Date Initiated: 02/28/2019 Date Inactivated: 04/04/2019 Target Resolution Date: 03/31/2019 Goal Status: Met Ulcer/skin breakdown  will have a volume reduction of 50% by week 8 Date Initiated: 04/04/2019 Date  Inactivated: 05/09/2019 Target Resolution Date: 05/05/2019 Goal Status: Met Ulcer/skin breakdown will have a volume reduction of 80% by week 12 Date Initiated: 05/09/2019 Date Inactivated: 06/13/2019 Target Resolution Date: 06/09/2019 Unmet Goal Status: Unmet Reason: comorbities/new wounds Ulcer/skin breakdown will heal within 14 weeks Date Initiated: 06/13/2019 Date Inactivated: 07/11/2019 Target Resolution Date: 07/07/2019 Unmet Goal Status: Unmet Reason: comorbityies Interventions: Assess patient/caregiver ability to obtain necessary supplies Assess patient/caregiver ability to perform ulcer/skin care regimen upon admission and as needed Assess ulceration(s) every visit Notes: Electronic Signature(s) Signed: 10/05/2019 5:19:45 PM By: Deon Pilling Entered By: Deon Pilling on 10/05/2019 14:48:20 -------------------------------------------------------------------------------- Pain Assessment Details Patient Name: Date of Service: [name], [name] 10/05/2019 1:45 PM Medical Record [id number] Patient Account Number: [id number] Date of Birth/Sex: Treating RN: 1941/07/23 (79 y.o. [name] Primary Care Amoni Scallan: Hurshel Party Other Clinician: Referring Rishita Petron: Treating Iyanah Demont/Extender:Robson, Cheryl Flash, [place] Weeks in Treatment: 3 Active Problems Location of Pain Severity and Description of Pain Patient Has Paino No Site Locations Pain Management and Medication Current Pain Management: Electronic Signature(s) Signed: 10/10/2019 6:18:44 PM By: Levan Hurst RN, BSN Entered By: Levan Hurst on 10/05/2019 14:25:10 -------------------------------------------------------------------------------- Patient/Caregiver Education Details Patient Name: Date of Service: [name] 1/7/2021andnbsp1:45 PM Medical Record [id number] Patient Account Number: [id number] Date of  Birth/Gender: Treating RN: 1941-06-18 (78 y.o. [name] Primary Care Physician: Hurshel Party Other Clinician: Referring Physician: Treating Physician/Extender:Robson, Cheryl Flash, [facility] Weeks in Treatment: 71 Education Assessment Education Provided To: Patient Education Topics Provided Wound/Skin Impairment: Handouts: Skin Care Do's and Dont's Methods: Explain/Verbal Responses: Reinforcements needed Electronic Signature(s) Signed: 10/05/2019 5:19:45 PM By: Deon Pilling Entered By: Deon Pilling on 10/05/2019 14:48:34 -------------------------------------------------------------------------------- Wound Assessment Details Patient Name: Date of Service: [name], [name] 10/05/2019 1:45 PM Medical Record [id number] Patient Account Number: [id number] Date of Birth/Sex: Treating RN: 11-Apr-1941 (79 y.o. [name] Primary Care Devora Tortorella: Hurshel Party Other Clinician: Referring Abel Ra: Treating Robyne Matar/Extender:Robson, Cheryl Flash, [place] Weeks in Treatment: 31 Wound Status Wound Number: 13 Primary Diabetic Wound/Ulcer of the Lower Extremity Etiology: Wound Location: Left Toe Second Wound Open Wounding Event: Gradually Appeared Status: Date Acquired: 08/22/2019 Comorbid Cataracts, Hypertension, Peripheral Venous Weeks Of Treatment: 6 History: Disease, Type II Diabetes, Gout, Received Clustered Wound: No Radiation Wound Measurements Length: (cm) 1.5 Width: (cm) 1.3 Depth: (cm) 0.1 Area: (cm) 1.532 Volume: (cm) 0.153 Wound Description Classification: Grade 1 Wound Margin: Distinct, outline attached Exudate Amount: Large Exudate Type: Serosanguineous Exudate Color: red, brown Wound Bed Granulation Amount: None Present (0%) Necrotic Amount: Large (67-100%) Necrotic Quality: Adherent Slough After Cleansing: No rino Yes Exposed Structure sed: No Subcutaneous Tissue) Exposed: Yes sed: No sed: No ed: No d: No % Reduction in  Area: 50.2% % Reduction in Volume: 50.3% Epithelialization: None Tunneling: No Undermining: No Foul Odor Slough/Fib Fascia Expo Fat Layer ( Tendon Expo Muscle Expo Joint Expos Bone Expose Electronic Signature(s) Signed: 10/10/2019 6:18:44 PM By: Levan Hurst RN, BSN Entered By: Levan Hurst on 10/05/2019 14:31:33 -------------------------------------------------------------------------------- Wound Assessment Details Patient Name: Date of Service: Daniel Pia D. 10/05/2019 1:45 PM Medical Record [id number] Patient Account Number: [id number] Date of Birth/Sex: Treating RN: [birth year] (79 y.o. [name] Primary Care Kristilyn Coltrane: Hurshel Party Other Clinician: Referring Brighten Buzzelli: Treating Mikel Hardgrove/Extender:Robson, Cheryl Flash, [place] Weeks in Treatment: 31 Wound Status Wound Number: 18 Primary Diabetic Wound/Ulcer of the Lower Extremity Etiology: Wound Location: Left Foot - Medial Wound Open Wounding Event: Gradually Appeared Status: Date Acquired: 09/03/2019 Comorbid Cataracts, Hypertension, Peripheral Venous Weeks Of Treatment: 4 History: Disease, Type II  Diabetes, Gout, Received Clustered Wound: No Radiation Wound Measurements Length: (cm) 0.3 Width: (cm) 0.3 Depth: (cm) 0.1 Area: (cm) 0.071 Volume: (cm) 0.007 Wound Description Classification: Grade 2 Wound Margin: Distinct, outline attached Exudate Amount: None Present Wound Bed Granulation Amount: None Present (0%) Necrotic Amount: Large (67-100%) Necrotic Quality: Adherent Slough Foul Odor After Cleansing: No Slough/Fibrino Yes Exposed Structure Fascia Exposed: Fat Layer (Subcutaneous Tissue) Exposed: Tendon Exposed: Muscle Exposed: Joint Exposed: Bone Exposed: % Reduction in Area: 93% % Reduction in Volume: 93.1% Epithelialization: None Tunneling: No Undermining: No No No No No No No Electronic Signature(s) Signed: 10/10/2019 6:18:44 PM By: Levan Hurst RN,  BSN Entered By: Levan Hurst on 10/05/2019 14:31:58 -------------------------------------------------------------------------------- Wound Assessment Details Patient Name: Date of Service: Daniel Pia D. 10/05/2019 1:45 PM Medical Record [id number] Patient Account Number: [id number] Date of Birth/Sex: Treating RN: 1941-09-03 (79 y.o. [name] Primary Care Aishi Courts: Hurshel Party Other Clinician: Referring Sache Sane: Treating Ji Fairburn/Extender:Robson, Cheryl Flash, [place] Weeks in Treatment: 31 Wound Status Wound Number: 19 Primary Venous Leg Ulcer Etiology: Wound Location: Left Lower Leg - Circumferential Wound Open Wounding Event: Gradually Appeared Status: Date Acquired: 09/23/2019 Comorbid Cataracts, Hypertension, Peripheral Venous Weeks Of Treatment: 1 History: Disease, Type II Diabetes, Gout, Received Clustered Wound: Yes Radiation Wound Measurements Length: (cm) 26 % Reduct Width: (cm) 24 % Reduct Depth: (cm) 0.1 Epitheli Clustered Quantity: 15 Tunnelin Area: (cm) 490.088 Undermi Volume: (cm) 49.009 Wound Description Full Thickness Without Exposed Support Foul Odo Classification: Structures Slough/F Wound Distinct, outline attached Margin: Exudate Large Amount: Exudate Serosanguineous Type: Exudate red, brown Color: Wound Bed Granulation Amount: Medium (34-66%) Granulation Quality: Red, Pink Fascia E Necrotic Amount: Medium (34-66%) Fat Laye Necrotic Quality: Adherent Slough Tendon E Muscle E Joint Ex Bone Exp r After Cleansing: No ibrino Yes Exposed Structure xposed: No r (Subcutaneous Tissue) Exposed: Yes xposed: No xposed: No posed: No osed: No ion in Area: -814.3% ion in Volume: -814.3% alization: None g: No ning: No Electronic Signature(s) Signed: 10/10/2019 6:18:44 PM By: Levan Hurst RN, BSN Entered By: Levan Hurst on 10/05/2019  14:32:19 -------------------------------------------------------------------------------- [place] Details Patient Name: Date of Service: Daniel Pia D. 10/05/2019 1:45 PM Medical Record [id number] Patient Account Number: [id number] Date of Birth/Sex: Treating RN: 03-Feb-1941 (79 y.o. [name] Primary Care Vayla Wilhelmi: Hurshel Party Other Clinician: Referring Yurani Fettes: Treating Aleni Andrus/Extender:Robson, Cheryl Flash, [place] Weeks in Treatment: 31 Vital Signs Time Taken: 14:24 Temperature (F): 98.1 Height (in): 74 Pulse (bpm): 79 Weight (lbs): 212 Respiratory Rate (breaths/min): 22 Body Mass Index (BMI): 27.2 Blood Pressure (mmHg): 132/79 Reference Range: 80 - 120 mg / dl Electronic Signature(s) Signed: 10/10/2019 6:18:44 PM By: Levan Hurst RN, BSN Entered By: Levan Hurst on 10/05/2019 14:25:05

## 2019-10-10 NOTE — Progress Notes (Signed)
TYLEN, LEVERICH ([id number]) Visit Report for 10/10/2019 HPI Details Patient Name: Date of Service: [name], [name] 10/10/2019 8:00 AM Medical Record Number:[id number] Patient Account Number: [id number] Date of Birth/Sex: Treating RN: 1941/06/18 (79 y.o. M) Primary Care Provider: Hurshel Party Other Clinician: Referring Provider: Treating Provider/Extender:Keelen Quevedo, Cheryl Flash, [place] Weeks in Treatment: 32 History of Present Illness Location: Patient presents with a wound to left lower leg. Quality: Patient reports No Pain. Duration: 2 months HPI Description: no cig or alcohol. spontaneous appearance in area of stasis dermamtitis. [name] on metformin only. chronic afib on Coumadin. diabetes and coag studies not good. hba1c 7.5. ivr 4.5. no pain or sxs of systemic disease. hx chf. no intermittent claudication 02/28/2019 Readmission This is a now a 79 year old man who was previously cared for in 2016 by Dr. Lindon Romp for wounds on his lower extremities. At that point he had venous reflux studies although I cannot seem to open these in [employer] link. He had arterial studies showing an ABI of 1.11 on the right and 1.27 on the left his waveforms were triphasic bilaterally. He was discharged in stockings although I do not believe he is wearing these in some time. He tells me that about a month ago he noted openings of a large wound on the posterior right calf and 2 smaller areas on the left lateral calf and a small area more recently on the left posterior calf. He has been dressing these with peroxide and triple antibiotic ointment. He is not wearing compression. Past medical history; type 2 diabetes with peripheral neuropathy, chronic venous insufficiency, hypertension, cardiomyopathy, chronic atrial fibrillation on Coumadin, prostate cancer, hyperlipidemia, gout, ABI in our clinic was 1.34 on the left and not obtainable on the right 6/9; this is a patient who has chronic venous  insufficiency. He has a fairly substantial area on the right posterior calf, left lateral calf and a small area on the left posterior calf. On arrival last week he had very palpable popliteal and femoral pulses but nothing in his bilateral feet. Unfortunately we cannot get arterial studies until July 1 at Dr. Kennon Holter office. They live in [place]. We use silver alginate under Kerlix Coban 6/16; patient with chronic venous insufficiency with wounds on his bilateral lower extremities. When he came into our clinic he was discovered to have a complete absence of peripheral pedal pulses at either the dorsalis pedis or posterior tibial. He does have easily palpable femoral and popliteal pulses. He sees Dr. [name] tomorrow for noninvasive arterial tests. He may also require venous reflux evaluation although I do not view this as an urgent thing. We have been using silver alginate. His wound surfaces of cleaned up quite nicely 6/23; patient with chronic venous insufficiency with wounds on his bilateral lower extremities. His wounds all are somewhat better looking. He did go to Dr. Kennon Holter office but somehow ended up on the doctors schedule rather than being scheduled for noninvasive tests therefore his noninvasive tests are scheduled for July 1. We agree that he has venous insufficiency ulcers but I cannot feel any pulses in his lower extremities dictating the need for test. We are only using Kerlix and light Coban unfortunately this appears to be holding the edema 6/30; has his arterial studies tomorrow. We have been using Kerlix and light Coban will go to a more aggressive compression if the arterial studies will allow. We all agreed these are venous wounds however I cannot feel pulses at either the dorsalis pedis or posterior tibial bilaterally. His  wounds generally look some better including left lateral and right posterior. 7/7-Patient returns at 1 week in Kerlix/Coban to both legs, with improvement,  in the left lateral and right posterior lower leg wounds, ABI's are normal in both legs per vascular studies, TBI is also normal on both sides, we are using hydrofera blue to the wounds 7/14; patient's arterial studies from 2 weeks ago showed an ABI on the right at 1.03 with a TBI of 0.86. On the left the ABI was 1.06 with a TBI of 0.84. Notable for the fact that his arterial waveforms were monophasic in all of the lower extremity arteries suggesting some degree of arterial occlusive disease but in general this was felt to be fairly adequate for healing. His compression was increased from 2-3 layer which is appropriate. Dressing was changed to [facility] 7/21; patient's wounds are measuring smaller. The more substantial one on the right posterior calf, second 1 on the left lateral calf. Using [facility] on both wound areas 7/28; patient continues to make nice improvements. The area on the right posterior calf is smaller. Area on the left lateral calf also is smaller. We have been using Hydrofera Blue under compression. The patient will need compression stockings and we have measured him for these in the eventuality that these heal which really should not be too long from now 8/4-Patient continues to make improvement, the right posterior calf area smaller with rim of keratotic skin on one side, the left wound is definitely smaller and improving. 8/11-Returns at 1 week, after being in 3 layer compression on both legs, both wounds appear to be improving, making good progress, patient is happy, pain is also less especially in the right leg wound 8/18; the area on the left anterior lower leg is healed. On the right posterior leg the wound remains although the dimensions are a lot better. 8/25; he arrives in clinic today with a large body of open wound on the left lateral calf. All of the 3 wounds in this area are in close juxtaposition to each other. The story is that we discharged him last  week with no a wrap on the left leg. They went to [facility] could not get in as they are only excepting phone orders or online orders for stockings hence they did not put any stocking on the left leg all week. They have something at home but the patient with that was either incapable or just did not put them on. Apparently these opened 1 morning after getting out of bed. The area on the right has no real change 9/1; patient has bilateral lower extremity wounds in the setting of severe chronic venous insufficiency and secondary lymphedema. He arrived last week with new areas on the left lateral lower leg after we did not wrap him and he did not use his stockings. Nevertheless the areas on the left look better today under compression. Posterior right calf does not really changed. We are using Hydrofera Blue on both areas under compression 9/15; bilateral lower extremity wounds in the setting of severe chronic venous insufficiency and secondary lymphedema. He has 20 to 30 mmHg below-knee compression stockings under the eventuality that these close over. We did get the left leg to close but he did not transition to a stocking and this reopened. There are 2 open areas on the left posterior lateral calf and one on the right. Both of these look satisfactory. Using [facility] 9/22; bilateral lower extremity wounds in the setting of  chronic venous insufficiency. 2 superficial areas on the left lateral calf. One on the right just above the Achilles area. We have good edema control we have been using Hydrofera Blue 9/29; the areas on the left lateral calf are healed. On the right just above the Achilles and tendon area things look a lot better small wounds one scabbed area. We have been using Hydrofera Blue. We can discharge him in his own stocking on the left still wrapping on the right. This is the second time we have healed the left leg but he did not put a stocking on last time. Hopefully this will  maintain the edema from chronic venous disease with secondary lymphedema 10/6; he comes in today having a stocking on the left leg. They had trouble getting it on there is a lot of increase in swelling 2 small open areas one anteriorly and one on the medial calf. They report a lot of difficulty getting the stocking on. Paradoxically the area on the right that we have been wrapping posteriorly is closed 10/13; he comes in today with wounds bilaterally including superficial areas on the left medial and left lateral calf. As well as the right posterior has reopened in the Achilles area superiorly. He still does not have his juxta lite stockings although truthfully we would not of been able to use them today anyway. Apparently have been ordered and paid for from [employer] although they have not been delivered 10/20; his area on the right is just the boat closed on the right posterior. Still has the area on the left lateral and a very tiny area on the left medial. He has his bilateral juxta lites although he is not ready for them this week. He tolerated the increase to 4 layer compression last week quite well 10/27; the area on the right posterior calf is once again closed. He has a superficial area on the left lateral calf that is still open. He has been using Hydrofera Blue and bilateral 4-layer compression. He can change to his own juxta lite stocking on the right and we are instructing him today 11/3; the area on the right posterior calf reopened according to the patient and his wife after they took off the stocking when they got home last week. Apparently scabbed over there is now a fairly substantial wound which looks pretty much the same. Our intake nurse noted that they were using the juxta lite stockings appropriately. I was really hoping I might be able to close him out today. He has 1 very tiny remaining area on the left lateral lower leg. 11/10; right posterior calf wound measures smaller but  is still open. We have been using Hydrofera Blue. On the left he has a small oval-shaped wound and he seems to have had another wound distally that is open and likely a blister. We are using Hydrofera Blue under compression 11/17; right posterior calf wound continues to get better. We have been using Hydrofera Blue. On the left lateral one of the wounds has closed still a small open area. We have been using Hydrofera Blue on this as well. Both areas have been under 4-layer compression Arrives in clinic today with some swelling in the dorsal foot on the right some erythema of his forefoot and toes. Initially when I looked at this I almost thought this was a sunburn distal to a wrap injury. 12/1; right posterior calf wound debrided with a curette. We have been using Hydrofera Blue on the left anterior  lateral he has an area across the mid tibia. Finally a small area on the left lateral lower calf. Finally he continues to have de-epithelialized areas on the dorsal aspect of his toes. Initially thought this might be a burn injury when I saw him 2 weeks ago. I now wonder about tinea. I have also reviewed his arterial studies which were really quite good in July/20 with normal TBI's and ABIs but monophasic waveforms 12/8; comes in today with worsening problems especially on the left leg where he now has a cluster of wounds in the left anterior mid tibia. Very poor edema control. I reduced him to 3 layer from 4 layer compression last week because of some concern about blood flow to his toes however he does not have good edema control on the left leg. Right leg edema control looks satisfactory. On the left he has a cluster of wounds anteriorly, small area on the left medial fifth met head and then the collection of areas on his toes which appear better On the right he has the original area on the right posterior calf, a new area right medially. His formal arterial studies from mid July are noted below.  He was evaluated by Dr.Berry ABI Findings: +---------+------------------+-----+----------+--------+ Right Rt Pressure (mmHg)IndexWaveform Comment  +---------+------------------+-----+----------+--------+ Brachial 176     +---------+------------------+-----+----------+--------+ ATA 176 0.99 monophasic  +---------+------------------+-----+----------+--------+ PTA 183 1.03 monophasic  +---------+------------------+-----+----------+--------+ PERO 172 0.97 monophasic  +---------+------------------+-----+----------+--------+ Great Toe153 0.86    +---------+------------------+-----+----------+--------+ +---------+------------------+-----+-----------+-------+ Left Lt Pressure (mmHg)IndexWaveform Comment +---------+------------------+-----+-----------+-------+ Brachial 178     +---------+------------------+-----+-----------+-------+ ATA 162 0.91 multiphasic  +---------+------------------+-----+-----------+-------+ PTA 188 1.06 multiphasic  +---------+------------------+-----+-----------+-------+ PERO 158 0.89 monophasic   +---------+------------------+-----+-----------+-------+ Great Toe150 0.84    +---------+------------------+-----+-----------+-------+ +-------+-----------+-----------+------------+------------+ ABI/TBIToday's ABIToday's TBIPrevious ABIPrevious TBI +-------+-----------+-----------+------------+------------+ Right 1.03 0.86 1.11   +-------+-----------+-----------+------------+------------+ Left 1.06 0.84 1.27   +-------+-----------+-----------+------------+------------+ Tibial waveforms somewhat difficult to record due to irregular heartbeat. Bilateral ABIs appear essentially unchanged compared to prior study on 06/21/15. Summary: Right: Resting right ankle-brachial index is within normal range. No evidence of significant right lower extremity arterial disease. The right  toe-brachial index is normal. Although ankle brachial indices are within normal limits (0.95-1.29), arterial Doppler waveforms at the ankle suggest some component of arterial occlusive disease. Left: Resting left ankle-brachial index is within normal range. No evidence of significant left lower extremity arterial disease. The left toe-brachial index is normal. Although ankle brachial indices are within normal limits (0.95-1.29), arterial Doppler waveforms at the ankle suggest some component of arterial occlusive disease. 12/15; the patient's area on the left mid tibia looks better. Right posterior calf also better. He has the area on the left foot as well. All of his toes look better I think this was tinea. We are using Hydrofera Blue everywhere else The patient was in urgent care yesterday with wheezing and shortness of breath. He got azithromycin and prednisone. He feels better. His lungs are currently clear to auscultation. He was not tested for Covid 19 12/29; the patient arrives in clinic today with quite a bit change. 2 weeks ago he only had areas on the left mid tibia right posterior calf with tinea pedis resolving between his toes. He arrives in clinic today with several areas on the dorsal toes on the right, dorsal left second toe. He has skin breakdown in the left medial calf probably from excess edema. Small area proximally in the medial calf. He has weeping edema fluid coming out of the skin excoriations on the left medial calf. He tells me that  he is having a cardiac catheterization next week. I had a quick look at [facility] health link. He was found to have an ejection fraction of 25% during the work-up for persistent atrial fibrillation. He saw his cardiology office yesterday seen by the nurse practitioner. She increased his carvedilol. He has not been on diuretics for apparently several months and indeed in the nurse practitioner Dietrich Pates notes she had knowledge of this. 10/04/2019  on evaluation today patient presents as a walk-in visit concerning the fact that he did not have an appointment here for our clinic at this point. He actually had a cardiac catheterization earlier today and then came from there to here in order to be evaluated. With that being said unfortunately he is having significant cellulitis of his left lower extremity upon evaluation today this appears to have deteriorated even since last week's evaluation with Dr. Dellia Nims. The right lower extremity is actually doing okay I really see no evidence of deterioration at this point at those locations. In fact the right leg seems in general be doing quite well. Nonetheless I am concerned about infection and cellulitis of the left lower extremity and again considering his weakened heart I do not want him to develop into sepsis at all. He is also having some trouble breathing today and I understand according to nursing staff this is always the case to some degree. With that being said the patient unfortunately seems to be in my opinion a little bit worse even his wife feels like that may be the case today. Unsure exactly what is leading to this. Cardiac catheterization I did review the report which showed an ejection fraction of 25% he also had an LAD blockage of around 25% based on what I saw. With that being said there was no significant blockages that required stenting at this point he does have weakened cardiac muscles compared to normal. 10/05/2019; patient was seen yesterday in clinic. He was sent to the ER because of cellulitis of the left leg possibility. In the ER he was given 1 dose of IV Levaquin and discharged on Keflex. He came in the clinic initially for a nurse visit to rewrap his left leg. We did not look at the right leg today that is an Haematologist. The patient also had a cardiac cath. According to him there were no blockages but a very low ejection fraction. 1/12; back for an early follow-up. The  condition of the left lower leg is a lot better although there are multiple open areas. All of them with not a particularly viable surface. On the right posterior calf he has a single wound with a clean surface. He has a wound with exposed bone on the PIP of the left second toe dorsally. He has wounds on the dorsal right first second and third toes. His arterial studies were normal. Electronic Signature(s) Signed: 10/10/2019 6:16:53 PM By: [name] MD Entered By: [name] on 10/10/2019 09:25:52 -------------------------------------------------------------------------------- Physical Exam Details Patient Name: Date of Service: Daniel Pia D. 10/10/2019 8:00 AM Medical Record [id number] Patient Account Number: [id number] Date of Birth/Sex: Treating RN: 06-Apr-1941 (79 y.o. M) Primary Care Provider: Hurshel Party Other Clinician: Referring Provider: Treating Provider/Extender:Greggory Safranek, Cheryl Flash, [place] Weeks in Treatment: 32 Constitutional Sitting or standing Blood Pressure is within target range for patient.. Pulse regular and within target range for patient.Marland Kitchen Respirations regular, non-labored and within target range.. Temperature is normal and within the target range for the patient.Marland Kitchen Appears in no distress. Respiratory work  of breathing is normal. Bilateral breath sounds are clear and equal in all lobes with no wheezes, rales or rhonchi.. Cardiovascular Heart rhythm and rate regular, without murmur or gallop. JVP is not elevated there is no S3. Needle pulses are palpable. Integumentary (Hair, Skin) Severe bilateral left greater than right venous insufficiency with stasis dermatitis mostly on the left. Psychiatric appears at normal baseline. Notes -The right leg he actually has a small clean wound on the posterior calf. He has changes of chronic venous insufficiency, varicosities and hemosiderin deposition but wearing a lot better shape here. 3 superficial  areas on the dorsal first second and third toes. On the left leg there are multiplicity of wounds all of which I think are related to chronic stasis dermatitis complicated by cellulitis. All of these are in roughly the same state. They will require debridement and I am continuing Iodoflex. The left second toe over the PIP has exposed bone and joint. I am going to x-ray this area. I am not sure that this is going to be viable going forward. Sometimes it is possible to remove the bone from the wound orifice and to create a healing surface however I am not going to do that until I am more confident about infection. The wounds on his toes were related to foot wear trauma Electronic Signature(s) Signed: 10/10/2019 6:16:53 PM By: [name] MD Entered By: [name] on 10/10/2019 09:29:10 -------------------------------------------------------------------------------- Physician Orders Details Patient Name: Date of Service: Daniel Pia D. 10/10/2019 8:00 AM Medical Record [id number] Patient Account Number: [id number] Date of Birth/Sex: Treating RN: December 14, 1940 (78 y.o. [name]) [name] Primary Care Provider: Hurshel Party Other Clinician: Referring Provider: Treating Provider/Extender:Lusero Nordlund, Cheryl Flash, [place] Weeks in Treatment: 40 Verbal / Phone Orders: No Diagnosis Coding ICD-10 Coding Code Description E11.51 Type 2 diabetes mellitus with diabetic peripheral angiopathy without gangrene L97.211 Non-pressure chronic ulcer of right calf limited to breakdown of skin E11.42 Type 2 diabetes mellitus with diabetic polyneuropathy L97.221 Non-pressure chronic ulcer of left calf limited to breakdown of skin I87.333 Chronic venous hypertension (idiopathic) with ulcer and inflammation of bilateral lower extremity L97.521 Non-pressure chronic ulcer of other part of left foot limited to breakdown of skin L97.511 Non-pressure chronic ulcer of other part of right foot limited to  breakdown of skin H88.50 Chronic systolic (congestive) heart failure Follow-up Appointments Return Appointment in 1 week. - Tuesday **********EXTRA TIME**************** Dressing Change Frequency Wound #13 Left Toe Second Change dressing every day. Wound #18 Left,Medial Foot Do not change entire dressing for one week. Wound #19 Left,Circumferential Lower Leg Do not change entire dressing for one week. Wound #20 Right Toe Great Change dressing every day. Wound #21 Right Toe Second Change dressing every day. Wound #22 Right Toe Third Change dressing every day. Wound #24 Left,Distal,Anterior Lower Leg Do not change entire dressing for one week. Wound #25 Left,Distal,Posterior Lower Leg Do not change entire dressing for one week. Wound #26 Left,Medial Lower Leg Do not change entire dressing for one week. Wound #27 Left,Proximal,Lateral Lower Leg Do not change entire dressing for one week. Wound #3R Right,Posterior Calf Do not change entire dressing for one week. Skin Barriers/Peri-Wound Care TCA Cream or Ointment Wound Cleansing May shower with protection. Primary Wound Dressing Wound #13 Left Toe Second Calcium Alginate with Silver Wound #18 Left,Medial Foot Iodoflex Wound #19 Left,Circumferential Lower Leg Iodoflex Wound #20 Right Toe Great Calcium Alginate with Silver Wound #21 Right Toe Second Calcium Alginate with Silver Wound #22 Right Toe Third Calcium Alginate with  Silver Wound #24 Left,Distal,Anterior Lower Leg Iodoflex Wound #25 Left,Distal,Posterior Lower Leg Iodoflex Wound #26 Left,Medial Lower Leg Iodoflex Wound #27 Left,Proximal,Lateral Lower Leg Iodoflex Wound #3R Right,Posterior Calf Iodoflex Secondary Dressing Dry Gauze - secure toes with tape Edema Control Unna Boots Bilaterally Off-Loading Open toe surgical shoe to: - left and right foot Radiology X-ray, foot left - special attention to 2nd toe , non healing wound, with bone exposure , rule  out osteomylitis - (ICD10 E11.51 - Type 2 diabetes mellitus with diabetic peripheral angiopathy without gangrene) Electronic Signature(s) Signed: 10/10/2019 6:16:53 PM By: [name] MD Signed: 10/10/2019 6:18:44 PM By: Levan Hurst RN, BSN Entered By: Levan Hurst on 10/10/2019 11:09:39 -------------------------------------------------------------------------------- Prescription 10/10/2019 Patient Name: Daniel Pia D. Provider: [name] MD Date of Birth: 08-Dec-1940 NPI#: [id number] Sex: M DEA#: [id number] Phone #: [phone] License #: [id number] Patient Address: [place] [address] [place], [place] [address] [address] [place], [place] [address] [phone] Allergies clarithromycin Reaction: body aches Severity: Moderate Fluoride Preparations Severity: Moderate Provider's Orders X-ray, foot left - ICD10: E11.51 - special attention to 2nd toe , non healing wound, with bone exposure , rule out osteomylitis Signature(s): Date(s): Electronic Signature(s) Signed: 10/10/2019 6:16:53 PM By: [name] MD Signed: 10/10/2019 6:18:44 PM By: Levan Hurst RN, BSN Entered By: Levan Hurst on 10/10/2019 11:09:41 --------------------------------------------------------------------------------  Problem List Details Patient Name: Date of Service: Daniel Pia D. 10/10/2019 8:00 AM Medical Record [id number] Patient Account Number: [id number] Date of Birth/Sex: Treating RN: Apr 08, 1941 (78 y.o. [name]) Dolores Lory, [place] Primary Care Provider: Hurshel Party Other Clinician: Referring Provider: Treating Provider/Extender: Rosina Lowenstein in Treatment: 32 Active Problems ICD-10 Evaluated Encounter Code Description Active Date Today Diagnosis E11.51 Type 2 diabetes mellitus with diabetic peripheral 02/28/2019 No Yes angiopathy without gangrene L97.211 Non-pressure chronic ulcer of right calf limited  to 02/28/2019 No Yes breakdown of skin E11.42 Type 2 diabetes mellitus with diabetic polyneuropathy 02/28/2019 No Yes L97.221 Non-pressure chronic ulcer of left calf limited to 02/28/2019 No Yes breakdown of skin I87.333 Chronic venous hypertension (idiopathic) with ulcer 02/28/2019 No Yes and inflammation of bilateral lower extremity L97.524 Non-pressure chronic ulcer of other part of left foot 10/10/2019 No Yes with necrosis of bone L97.511 Non-pressure chronic ulcer of other part of right foot 09/26/2019 No Yes limited to breakdown of skin I43.32 Chronic systolic (congestive) heart failure 10/05/2019 No Yes Inactive Problems ICD-10 Code Description Active Date Inactive Date B35.3 Tinea pedis 09/05/2019 09/05/2019 L97.521 Non-pressure chronic ulcer of other part of left foot limited to 09/26/2019 09/26/2019 breakdown of skin Resolved Problems Electronic Signature(s) Signed: 10/10/2019 6:16:53 PM By: [name] MD Entered By: [name] on 10/10/2019 09:20:26 -------------------------------------------------------------------------------- Progress Note Details Patient Name: Date of Service: Daniel Pia D. 10/10/2019 8:00 AM Medical Record [id number] Patient Account Number: [id number] Date of Birth/Sex: Treating RN: [birth year] (79 y.o. M) Primary Care Provider: Other Clinician: Hurshel Party Referring Provider: Treating Provider/Extender:Margy Sumler, Cheryl Flash, Walker Weeks in Treatment: 32 Subjective History of Present Illness (HPI) The following HPI elements were documented for the patient's wound: Location: Patient presents with a wound to left lower leg. Quality: Patient reports No Pain. Duration: 2 months no cig or alcohol. spontaneous appearance in area of stasis dermamtitis. [name] on metformin only. chronic afib on Coumadin. diabetes and coag studies not good. hba1c 7.5. ivr 4.5. no pain or sxs of systemic disease. hx chf. no intermittent  claudication 02/28/2019 Readmission This is a now a 79 year old man who was  previously cared for in 2016 by Dr. Lindon Romp for wounds on his lower extremities. At that point he had venous reflux studies although I cannot seem to open these in [facility]. He had arterial studies showing an ABI of 1.11 on the right and 1.27 on the left his waveforms were triphasic bilaterally. He was discharged in stockings although I do not believe he is wearing these in some time. He tells me that about a month ago he noted openings of a large wound on the posterior right calf and 2 smaller areas on the left lateral calf and a small area more recently on the left posterior calf. He has been dressing these with peroxide and triple antibiotic ointment. He is not wearing compression. Past medical history; type 2 diabetes with peripheral neuropathy, chronic venous insufficiency, hypertension, cardiomyopathy, chronic atrial fibrillation on Coumadin, prostate cancer, hyperlipidemia, gout, ABI in our clinic was 1.34 on the left and not obtainable on the right 6/9; this is a patient who has chronic venous insufficiency. He has a fairly substantial area on the right posterior calf, left lateral calf and a small area on the left posterior calf. On arrival last week he had very palpable popliteal and femoral pulses but nothing in his bilateral feet. Unfortunately we cannot get arterial studies until July 1 at Dr. Kennon Holter office. They live in [place]. We use silver alginate under Kerlix Coban 6/16; patient with chronic venous insufficiency with wounds on his bilateral lower extremities. When he came into our clinic he was discovered to have a complete absence of peripheral pedal pulses at either the dorsalis pedis or posterior tibial. He does have easily palpable femoral and popliteal pulses. He sees Dr. [name] tomorrow for noninvasive arterial tests. He may also require venous reflux evaluation although I do not view this  as an urgent thing. We have been using silver alginate. His wound surfaces of cleaned up quite nicely 6/23; patient with chronic venous insufficiency with wounds on his bilateral lower extremities. His wounds all are somewhat better looking. He did go to Dr. Kennon Holter office but somehow ended up on the doctors schedule rather than being scheduled for noninvasive tests therefore his noninvasive tests are scheduled for July 1. We agree that he has venous insufficiency ulcers but I cannot feel any pulses in his lower extremities dictating the need for test. We are only using Kerlix and light Coban unfortunately this appears to be holding the edema 6/30; has his arterial studies tomorrow. We have been using Kerlix and light Coban will go to a more aggressive compression if the arterial studies will allow. We all agreed these are venous wounds however I cannot feel pulses at either the dorsalis pedis or posterior tibial bilaterally. His wounds generally look some better including left lateral and right posterior. 7/7-Patient returns at 1 week in Kerlix/Coban to both legs, with improvement, in the left lateral and right posterior lower leg wounds, ABI's are normal in both legs per vascular studies, TBI is also normal on both sides, we are using hydrofera blue to the wounds 7/14; patient's arterial studies from 2 weeks ago showed an ABI on the right at 1.03 with a TBI of 0.86. On the left the ABI was 1.06 with a TBI of 0.84. Notable for the fact that his arterial waveforms were monophasic in all of the lower extremity arteries suggesting some degree of arterial occlusive disease but in general this was felt to be fairly adequate for healing. His compression was increased from  2-3 layer which is appropriate. Dressing was changed to [facility] 7/21; patient's wounds are measuring smaller. The more substantial one on the right posterior calf, second 1 on the left lateral calf. Using [facility] on  both wound areas 7/28; patient continues to make nice improvements. The area on the right posterior calf is smaller. Area on the left lateral calf also is smaller. We have been using Hydrofera Blue under compression. The patient will need compression stockings and we have measured him for these in the eventuality that these heal which really should not be too long from now 8/4-Patient continues to make improvement, the right posterior calf area smaller with rim of keratotic skin on one side, the left wound is definitely smaller and improving. 8/11-Returns at 1 week, after being in 3 layer compression on both legs, both wounds appear to be improving, making good progress, patient is happy, pain is also less especially in the right leg wound 8/18; the area on the left anterior lower leg is healed. On the right posterior leg the wound remains although the dimensions are a lot better. 8/25; he arrives in clinic today with a large body of open wound on the left lateral calf. All of the 3 wounds in this area are in close juxtaposition to each other. The story is that we discharged him last week with no a wrap on the left leg. They went to  could not get in as they are only excepting phone orders or online orders for stockings hence they did not put any stocking on the left leg all week. They have something at home but the patient with that was either incapable or just did not put them on. Apparently these opened 1 morning after getting out of bed. The area on the right has no real change 9/1; patient has bilateral lower extremity wounds in the setting of severe chronic venous insufficiency and secondary lymphedema. He arrived last week with new areas on the left lateral lower leg after we did not wrap him and he did not use his stockings. Nevertheless the areas on the left look better today under compression. Posterior right calf does not really changed. We are using Hydrofera Blue on both  areas under compression 9/15; bilateral lower extremity wounds in the setting of severe chronic venous insufficiency and secondary lymphedema. He has 20 to 30 mmHg below-knee compression stockings under the eventuality that these close over. We did get the left leg to close but he did not transition to a stocking and this reopened. There are 2 open areas on the left posterior lateral calf and one on the right. Both of these look satisfactory. Using [facility] 9/22; bilateral lower extremity wounds in the setting of chronic venous insufficiency. 2 superficial areas on the left lateral calf. One on the right just above the Achilles area. We have good edema control we have been using Hydrofera Blue 9/29; the areas on the left lateral calf are healed. On the right just above the Achilles and tendon area things look a lot better small wounds one scabbed area. We have been using Hydrofera Blue. We can discharge him in his own stocking on the left still wrapping on the right. This is the second time we have healed the left leg but he did not put a stocking on last time. Hopefully this will maintain the edema from chronic venous disease with secondary lymphedema 10/6; he comes in today having a stocking on the left leg. They had  trouble getting it on there is a lot of increase in swelling 2 small open areas one anteriorly and one on the medial calf. They report a lot of difficulty getting the stocking on. ooParadoxically the area on the right that we have been wrapping posteriorly is closed 10/13; he comes in today with wounds bilaterally including superficial areas on the left medial and left lateral calf. As well as the right posterior has reopened in the Achilles area superiorly. He still does not have his juxta lite stockings although truthfully we would not of been able to use them today anyway. Apparently have been ordered and paid for from [employer] although they have not been delivered 10/20;  his area on the right is just the boat closed on the right posterior. Still has the area on the left lateral and a very tiny area on the left medial. He has his bilateral juxta lites although he is not ready for them this week. He tolerated the increase to 4 layer compression last week quite well 10/27; the area on the right posterior calf is once again closed. He has a superficial area on the left lateral calf that is still open. He has been using Hydrofera Blue and bilateral 4-layer compression. He can change to his own juxta lite stocking on the right and we are instructing him today 11/3; the area on the right posterior calf reopened according to the patient and his wife after they took off the stocking when they got home last week. Apparently scabbed over there is now a fairly substantial wound which looks pretty much the same. Our intake nurse noted that they were using the juxta lite stockings appropriately. I was really hoping I might be able to close him out today. He has 1 very tiny remaining area on the left lateral lower leg. 11/10; right posterior calf wound measures smaller but is still open. We have been using Hydrofera Blue. On the left he has a small oval-shaped wound and he seems to have had another wound distally that is open and likely a blister. We are using Hydrofera Blue under compression 11/17; right posterior calf wound continues to get better. We have been using Hydrofera Blue. On the left lateral one of the wounds has closed still a small open area. We have been using Hydrofera Blue on this as well. Both areas have been under 4-layer compression Arrives in clinic today with some swelling in the dorsal foot on the right some erythema of his forefoot and toes. Initially when I looked at this I almost thought this was a sunburn distal to a wrap injury. 12/1; right posterior calf wound debrided with a curette. We have been using Hydrofera Blue on the left anterior lateral  he has an area across the mid tibia. Finally a small area on the left lateral lower calf. Finally he continues to have de-epithelialized areas on the dorsal aspect of his toes. Initially thought this might be a burn injury when I saw him 2 weeks ago. I now wonder about tinea. I have also reviewed his arterial studies which were really quite good in July/20 with normal TBI's and ABIs but monophasic waveforms 12/8; comes in today with worsening problems especially on the left leg where he now has a cluster of wounds in the left anterior mid tibia. Very poor edema control. I reduced him to 3 layer from 4 layer compression last week because of some concern about blood flow to his toes however he does not  have good edema control on the left leg. Right leg edema control looks satisfactory. ooOn the left he has a cluster of wounds anteriorly, small area on the left medial fifth met head and then the collection of areas on his toes which appear better ooOn the right he has the original area on the right posterior calf, a new area right medially. His formal arterial studies from mid July are noted below. He was evaluated by Dr.Berry ABI Findings: +---------+------------------+-----+----------+--------+ Right Rt Pressure (mmHg)IndexWaveform Comment  +---------+------------------+-----+----------+--------+ Brachial 176     +---------+------------------+-----+----------+--------+ ATA 176 0.99 monophasic  +---------+------------------+-----+----------+--------+ PTA 183 1.03 monophasic  +---------+------------------+-----+----------+--------+ PERO 172 0.97 monophasic  +---------+------------------+-----+----------+--------+ Great Toe153 0.86    +---------+------------------+-----+----------+--------+ +---------+------------------+-----+-----------+-------+ Left Lt Pressure (mmHg)IndexWaveform  Comment +---------+------------------+-----+-----------+-------+ Brachial 178     +---------+------------------+-----+-----------+-------+ ATA 162 0.91 multiphasic  +---------+------------------+-----+-----------+-------+ PTA 188 1.06 multiphasic  +---------+------------------+-----+-----------+-------+ PERO 158 0.89 monophasic   +---------+------------------+-----+-----------+-------+ Great Toe150 0.84    +---------+------------------+-----+-----------+-------+ +-------+-----------+-----------+------------+------------+ ABI/TBIToday's ABIToday's TBIPrevious ABIPrevious TBI +-------+-----------+-----------+------------+------------+ Right 1.03 0.86 1.11   +-------+-----------+-----------+------------+------------+ Left 1.06 0.84 1.27   +-------+-----------+-----------+------------+------------+ Tibial waveforms somewhat difficult to record due to irregular heartbeat. Bilateral ABIs appear essentially unchanged compared to prior study on 06/21/15. Summary: Right: Resting right ankle-brachial index is within normal range. No evidence of significant right lower extremity arterial disease. The right toe-brachial index is normal. Although ankle brachial indices are within normal limits (0.95-1.29), arterial Doppler waveforms at the ankle suggest some component of arterial occlusive disease. Left: Resting left ankle-brachial index is within normal range. No evidence of significant left lower extremity arterial disease. The left toe-brachial index is normal. Although ankle brachial indices are within normal limits (0.95-1.29), arterial Doppler waveforms at the ankle suggest some component of arterial occlusive disease. 12/15; the patient's area on the left mid tibia looks better. Right posterior calf also better. He has the area on the left foot as well. All of his toes look better I think this was tinea. We are using Hydrofera Blue everywhere  else The patient was in urgent care yesterday with wheezing and shortness of breath. He got azithromycin and prednisone. He feels better. His lungs are currently clear to auscultation. He was not tested for Covid 19 12/29; the patient arrives in clinic today with quite a bit change. 2 weeks ago he only had areas on the left mid tibia right posterior calf with tinea pedis resolving between his toes. He arrives in clinic today with several areas on the dorsal toes on the right, dorsal left second toe. He has skin breakdown in the left medial calf probably from excess edema. Small area proximally in the medial calf. He has weeping edema fluid coming out of the skin excoriations on the left medial calf. He tells me that he is having a cardiac catheterization next week. I had a quick look at [facility] health link. He was found to have an ejection fraction of 25% during the work-up for persistent atrial fibrillation. He saw his cardiology office yesterday seen by the nurse practitioner. She increased his carvedilol. He has not been on diuretics for apparently several months and indeed in the nurse practitioner Dietrich Pates notes she had knowledge of this. 10/04/2019 on evaluation today patient presents as a walk-in visit concerning the fact that he did not have an appointment here for our clinic at this point. He actually had a cardiac catheterization earlier today and then came from there to here in order to be evaluated. With that being said unfortunately he is having significant cellulitis of  his left lower extremity upon evaluation today this appears to have deteriorated even since last week's evaluation with Dr. Dellia Nims. The right lower extremity is actually doing okay I really see no evidence of deterioration at this point at those locations. In fact the right leg seems in general be doing quite well. Nonetheless I am concerned about infection and cellulitis of the left lower extremity and again  considering his weakened heart I do not want him to develop into sepsis at all. He is also having some trouble breathing today and I understand according to nursing staff this is always the case to some degree. With that being said the patient unfortunately seems to be in my opinion a little bit worse even his wife feels like that may be the case today. Unsure exactly what is leading to this. Cardiac catheterization I did review the report which showed an ejection fraction of 25% he also had an LAD blockage of around 25% based on what I saw. With that being said there was no significant blockages that required stenting at this point he does have weakened cardiac muscles compared to normal. 10/05/2019; patient was seen yesterday in clinic. He was sent to the ER because of cellulitis of the left leg possibility. In the ER he was given 1 dose of IV Levaquin and discharged on Keflex. He came in the clinic initially for a nurse visit to rewrap his left leg. We did not look at the right leg today that is an Haematologist. The patient also had a cardiac cath. According to him there were no blockages but a very low ejection fraction. 1/12; back for an early follow-up. The condition of the left lower leg is a lot better although there are multiple open areas. All of them with not a particularly viable surface. On the right posterior calf he has a single wound with a clean surface. He has a wound with exposed bone on the PIP of the left second toe dorsally. He has wounds on the dorsal right first second and third toes. His arterial studies were normal. Objective Constitutional Sitting or standing Blood Pressure is within target range for patient.. Pulse regular and within target range for patient.Marland Kitchen Respirations regular, non-labored and within target range.. Temperature is normal and within the target range for the patient.Marland Kitchen Appears in no distress. Vitals Time Taken: 8:15 AM, Height: 74 in, Source: Stated,  Weight: 212 lbs, Source: Stated, BMI: 27.2, Temperature: 98.3 F, Pulse: 84 bpm, Respiratory Rate: 20 breaths/min, Blood Pressure: 137/80 mmHg. Respiratory work of breathing is normal. Bilateral breath sounds are clear and equal in all lobes with no wheezes, rales or rhonchi.. Cardiovascular Heart rhythm and rate regular, without murmur or gallop. JVP is not elevated there is no S3. Needle pulses are palpable. Psychiatric appears at normal baseline. General Notes: -The right leg he actually has a small clean wound on the posterior calf. He has changes of chronic venous insufficiency, varicosities and hemosiderin deposition but wearing a lot better shape here. 3 superficial areas on the dorsal first second and third toes. ooOn the left leg there are multiplicity of wounds all of which I think are related to chronic stasis dermatitis complicated by cellulitis. All of these are in roughly the same state. They will require debridement and I am continuing Iodoflex. ooThe left second toe over the PIP has exposed bone and joint. I am going to x-ray this area. I am not sure that this is going to be viable going forward.  Sometimes it is possible to remove the bone from the wound orifice and to create a healing surface however I am not going to do that until I am more confident about infection. The wounds on his toes were related to foot wear trauma Integumentary (Hair, Skin) Severe bilateral left greater than right venous insufficiency with stasis dermatitis mostly on the left. Wound #13 status is Open. Original cause of wound was Gradually Appeared. The wound is located on the Left Toe Second. The wound measures 1.8cm length x 1.8cm width x 0.3cm depth; 2.545cm^2 area and 0.763cm^3 volume. There is bone, joint, and Fat Layer (Subcutaneous Tissue) Exposed exposed. There is no tunneling or undermining noted. There is a medium amount of serosanguineous drainage noted. The wound margin is distinct with  the outline attached to the wound base. There is no granulation within the wound bed. There is a large (67-100%) amount of necrotic tissue within the wound bed including Adherent Slough. Wound #18 status is Open. Original cause of wound was Gradually Appeared. The wound is located on the Left,Medial Foot. The wound measures 0.5cm length x 0.3cm width x 0.1cm depth; 0.118cm^2 area and 0.012cm^3 volume. There is Fat Layer (Subcutaneous Tissue) Exposed exposed. There is no tunneling or undermining noted. There is a small amount of purulent drainage noted. The wound margin is flat and intact. There is no granulation within the wound bed. There is a large (67-100%) amount of necrotic tissue within the wound bed including Adherent Slough. Wound #19 status is Open. Original cause of wound was Gradually Appeared. The wound is located on the Left,Circumferential Lower Leg. The wound measures 3.3cm length x 1.7cm width x 0.1cm depth; 4.406cm^2 area and 0.441cm^3 volume. There is Fat Layer (Subcutaneous Tissue) Exposed exposed. There is no tunneling or undermining noted. There is a medium amount of serosanguineous drainage noted. The wound margin is flat and intact. There is medium (34-66%) red, pink granulation within the wound bed. There is a medium (34-66%) amount of necrotic tissue within the wound bed including Adherent Slough. Wound #20 status is Open. Original cause of wound was Gradually Appeared. The wound is located on the Right Toe Great. The wound measures 1.5cm length x 0.5cm width x 0.1cm depth; 0.589cm^2 area and 0.059cm^3 volume. There is Fat Layer (Subcutaneous Tissue) Exposed exposed. There is no tunneling or undermining noted. There is a small amount of serosanguineous drainage noted. The wound margin is distinct with the outline attached to the wound base. There is small (1-33%) pink granulation within the wound bed. There is a large (67-100%) amount of necrotic tissue within the wound  bed including Adherent Slough. Wound #21 status is Open. Original cause of wound was Gradually Appeared. The wound is located on the Right Toe Second. The wound measures 1.2cm length x 1.1cm width x 0.1cm depth; 1.037cm^2 area and 0.104cm^3 volume. There is Fat Layer (Subcutaneous Tissue) Exposed exposed. There is no tunneling or undermining noted. There is a small amount of serosanguineous drainage noted. The wound margin is flat and intact. There is medium (34-66%) pink granulation within the wound bed. There is a medium (34-66%) amount of necrotic tissue within the wound bed including Adherent Slough. Wound #22 status is Open. Original cause of wound was Gradually Appeared. The wound is located on the Right Toe Third. The wound measures 1.3cm length x 1.3cm width x 0.1cm depth; 1.327cm^2 area and 0.133cm^3 volume. There is Fat Layer (Subcutaneous Tissue) Exposed exposed. There is no tunneling or undermining noted. There is a  small amount of serous drainage noted. The wound margin is flat and intact. There is no granulation within the wound bed. There is a large (67-100%) amount of necrotic tissue within the wound bed including Adherent Slough. Wound #24 status is Open. Original cause of wound was Gradually Appeared. The wound is located on the [facility] Lower Leg. The wound measures 5.8cm length x 1.4cm width x 0.1cm depth; 6.377cm^2 area and 0.638cm^3 volume. There is Fat Layer (Subcutaneous Tissue) Exposed exposed. There is no tunneling or undermining noted. There is a medium amount of serosanguineous drainage noted. The wound margin is flat and intact. There is medium (34-66%) red granulation within the wound bed. There is a medium (34-66%) amount of necrotic tissue within the wound bed including Adherent Slough. Wound #25 status is Open. Original cause of wound was Gradually Appeared. The wound is located on the Left,Distal,Posterior Lower Leg. The wound measures 7cm length x  4.5cm width x 0.1cm depth; 24.74cm^2 area and 2.474cm^3 volume. There is Fat Layer (Subcutaneous Tissue) Exposed exposed. There is no tunneling or undermining noted. There is a medium amount of serosanguineous drainage noted. The wound margin is flat and intact. There is small (1-33%) red, pink granulation within the wound bed. There is a large (67-100%) amount of necrotic tissue within the wound bed including Adherent Slough. Wound #26 status is Open. Original cause of wound was Gradually Appeared. The wound is located on the Left,Medial Lower Leg. The wound measures 1.7cm length x 0.7cm width x 0.1cm depth; 0.935cm^2 area and 0.093cm^3 volume. There is Fat Layer (Subcutaneous Tissue) Exposed exposed. There is no tunneling or undermining noted. There is a small amount of serosanguineous drainage noted. The wound margin is flat and intact. There is large (67-100%) red granulation within the wound bed. There is no necrotic tissue within the wound bed. Wound #27 status is Open. Original cause of wound was Gradually Appeared. The wound is located on the Left,Proximal,Lateral Lower Leg. The wound measures 3.5cm length x 3.8cm width x 0.1cm depth; 10.446cm^2 area and 1.045cm^3 volume. There is Fat Layer (Subcutaneous Tissue) Exposed exposed. There is no tunneling or undermining noted. There is a medium amount of serosanguineous drainage noted. The wound margin is flat and intact. There is small (1-33%) pink granulation within the wound bed. There is a large (67-100%) amount of necrotic tissue within the wound bed including Adherent Slough. Wound #3R status is Open. Original cause of wound was Gradually Appeared. The wound is located on the Right,Posterior Calf. The wound measures 1.8cm length x 1.4cm width x 0.1cm depth; 1.979cm^2 area and 0.198cm^3 volume. There is Fat Layer (Subcutaneous Tissue) Exposed exposed. There is no tunneling or undermining noted. There is a medium amount of serosanguineous  drainage noted. The wound margin is distinct with the outline attached to the wound base. There is large (67-100%) red granulation within the wound bed. There is a small (1-33%) amount of necrotic tissue within the wound bed including Adherent Slough. 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 - Tuesday **********EXTRA TIME**************** Dressing Change Frequency: Wound #13 Left Toe Second: Change dressing every day. Wound #18 Left,Medial Foot: Do not change entire dressing for one week. Wound #19 Left,Circumferential Lower Leg: Do not change entire dressing for one week. Wound #20 Right Toe Great: Change dressing every day. Wound #21 Right Toe Second: Change dressing every day. Wound #22 Right Toe Third: Change dressing every day. Wound #24 Left,Distal,Anterior Lower Leg: Do not change entire dressing for one week. Wound #25 Left,Distal,Posterior Lower Leg: Do not change entire dressing for one week. Wound #26 Left,Medial Lower Leg: Do not change entire dressing for one week. Wound #27 Left,Proximal,Lateral Lower Leg: Do not change entire dressing for one week. Wound #3R Right,Posterior Calf: Do not change entire dressing for one week. Skin Barriers/Peri-Wound Care: TCA Cream or Ointment Wound Cleansing: May shower with protection. Primary Wound  Dressing: Wound #13 Left Toe Second: Calcium Alginate with Silver Wound #18 Left,Medial Foot: Iodoflex Wound #19 Left,Circumferential Lower Leg: Iodoflex Wound #20 Right Toe Great: Calcium Alginate with Silver Wound #21 Right Toe Second: Calcium Alginate with Silver Wound #22 Right Toe Third: Calcium Alginate with Silver Wound #24 Left,Distal,Anterior Lower Leg: Iodoflex Wound #25 Left,Distal,Posterior Lower Leg: Iodoflex Wound #26 Left,Medial Lower Leg: Iodoflex Wound #27 Left,Proximal,Lateral Lower Leg: Iodoflex Wound #3R Right,Posterior Calf: Iodoflex Secondary Dressing: Dry Gauze - secure toes with tape Edema Control: Unna Boots Bilaterally Off-Loading: Open toe surgical shoe to: - left and right foot Radiology ordered were: X-ray, foot left - special attention to 2nd toe , non healing wound, with bone exposure , rule out osteomylitis 1. Silver alginate to the toes including the left second right first second and third 2. They will change this daily 3. All the wounds on the lower extremities especially on the left Iodoflex under Unna boot wraps 4. He does not have an arterial issue we previously verified this 5. I see no reason for consideration of further antibiotics he was given Keflex in the ER last week all of this looks a lot better. 6. I should note that he has severe stasis dermatitis in the left leg which is going to give some degree of brawny erythema. Liberal TCA today 7. X-ray of the left foot specific to the left second toe 8. I see no evidence of congestive heart failure at the bedside he is however a little bit tachycardic for my liking. Electronic Signature(s) Signed: 10/10/2019 6:16:53 PM By: [name] MD Signed: 10/10/2019 6:18:44 PM By: Levan Hurst RN, BSN Entered By: Levan Hurst on 10/10/2019 11:09:51 -------------------------------------------------------------------------------- [place] Details Patient Name: Date of  Service: Armando Gang 10/10/2019 Medical Record [id number] Patient Account Number: [id number] Date of Birth/Sex: Treating RN: June 16, 1941 (78 y.o. [name]) Dolores Lory, [place] Primary Care Provider: Hurshel Party Other Clinician: Referring Provider: Treating Provider/Extender:Tobie Hellen, Cheryl Flash, [place] Weeks in Treatment: 32 Diagnosis Coding ICD-10 Codes Code Description E11.51 Type 2 diabetes mellitus with diabetic peripheral angiopathy without gangrene L97.211 Non-pressure chronic ulcer of right calf limited to breakdown of skin E11.42 Type 2 diabetes mellitus with diabetic polyneuropathy L97.221 Non-pressure chronic ulcer of left calf limited to breakdown of skin I87.333 Chronic venous hypertension (idiopathic) with ulcer and inflammation of bilateral lower extremity L97.524 Non-pressure chronic ulcer of other part of left foot with necrosis of bone L97.511 Non-pressure chronic ulcer of other part of right foot limited to breakdown of skin I71.24 Chronic systolic (congestive) heart failure Facility Procedures The patient participates with Medicare or their insurance follows the Medicare Facility Guidelines:  CPT4 Code Description Modifier Quantity [id number] - WOUND CARE VISIT-LEV 5 EST PT 1 Physician Procedures Electronic Signature(s) Signed: 10/10/2019 6:16:53 PM By: [name] MD Entered By: [name] on 10/10/2019 09:31:32

## 2019-10-12 NOTE — Progress Notes (Signed)
Cardiology Office Note   Date:  10/13/2019   ID:  [name], DOB [birth year], MRN [id number]  PCP:  Doree Albee, MD Cardiologist:  Kate Sable, MD 08/31/2018   Dr [name]: PV Electrphysiologist: None Rosaria Ferries, PA-C    History of Present Illness: BARTT HA is a 79 y.o. male with a history of HTN, HLD, NICM w/ EF 45% 2016, S-CHF, chronic afib on coumadin, chronic LE wounds, non-compliant w/ Lasix at times due to urinary frequency  12/16, Cards appt>>echo ordered. EF 25%>>appt made 12/28 appt>>cath scheduled 10/04/2019 R/L cath, LVEDP 21>>needs diuresis, started on Lasix 40 mg qd  [name] presents for cardiology follow up. He is here with his daughter. His wife is present by phone. His daughter lives in [place], comes down to take him to appts and prn.   He feels his breathing is good right now. He feels the swelling is better than it used to be.  He goes to the wound center every week. The wounds on his legs are getting a little better.   He is not aware of the atrial fib. He is compliant with coumadin, dose increased last visit.   He denies new DOE, but activity level is poor at baseline.   He is able to move around some in the house, does not walk that much.   Denies orthopnea or PND.  Does not weigh daily now. Wife says they have scales, she will try to find them.   He is compliant with meds. His wife helps with this. His daughter is available if there are any questions. He is aware that he takes a fluid pill, says it really works on him.   Has not been light-headed or dizzy.    Past Medical History:  Diagnosis Date  . Cancer ([place])   . CHF (congestive heart failure) ([place])   . Chronic atrial fibrillation ([place])   . Chronic bronchitis   . Diabetes mellitus, type II ([place])    no insulin  . Gout   . History of herpes zoster virus   . History of radiation therapy 12/21/12- 02/15/13   prostate 78 gray in 40 fx  . Hyperlipidemia   .  Hypertension   . Prostate cancer ([place]) 2014   EBRT + hormonal therapy  . Vitamin D deficiency disease 06/07/2019    Past Surgical History:  Procedure Laterality Date  . KNEE SURGERY     left knee  . PROSTATE BIOPSY    . RIGHT/LEFT HEART CATH AND CORONARY ANGIOGRAPHY N/A 10/04/2019   Procedure: RIGHT/LEFT HEART CATH AND CORONARY ANGIOGRAPHY;  Surgeon: Belva Crome, MD;  Location: [facility];  Service: Cardiovascular;  Laterality: N/A;    Current Outpatient Medications  Medication Sig Dispense Refill  . acetaminophen (TYLENOL) 500 MG tablet Take 500 mg by mouth every 6 (six) hours as needed for mild pain or headache.     . allopurinol (ZYLOPRIM) 300 MG tablet TAKE 1 TABLET TWICE DAILY (Patient taking differently: Take 300 mg by mouth daily. ) 180 tablet 0  . ascorbic acid (VITAMIN C) 250 MG CHEW Chew 500 mg by mouth daily as needed (Cold).     . carvedilol (COREG) 25 MG tablet Take 1.5 tablets (37.5 mg total) by mouth 2 (two) times daily. 270 tablet 3  . cephALEXin (KEFLEX) 500 MG capsule Take 1 capsule (500 mg total) by mouth 4 (four) times daily. 28 capsule 0  . Cholecalciferol (VITAMIN D-3) 125 MCG (  5000 UT) TABS Take 5,000 Units by mouth daily.     . furosemide (LASIX) 40 MG tablet Take 1 tablet (40 mg total) by mouth daily. 90 tablet 3  . glipiZIDE (GLUCOTROL) 5 MG tablet TAKE 1 TABLET EVERY DAY (Patient taking differently: Take 5 mg by mouth daily. ) 90 tablet 0  . guaifenesin (WAL-TUSSIN) 100 MG/5ML syrup Take 200 mg by mouth 3 (three) times daily as needed for cough.     Marland Kitchen lisinopril (ZESTRIL) 20 MG tablet TAKE 1 TABLET TWICE DAILY (Patient taking differently: Take 20 mg by mouth daily. ) 180 tablet 0  . lovastatin (MEVACOR) 40 MG tablet TAKE 1 TABLET EVERY DAY (Patient taking differently: Take 40 mg by mouth at bedtime. ) 90 tablet 0  . NON FORMULARY Take 3 tablets by mouth daily. MANGA-CAL    . spironolactone (ALDACTONE) 25 MG tablet Take 1 tablet (25 mg total) by mouth  daily. 90 tablet 3  . vitamin E 400 UNIT capsule Take 800 Units by mouth daily.     Marland Kitchen warfarin (COUMADIN) 5 MG tablet TAKE 1 TABLET EVERY DAY (Patient taking differently: Take 5 mg by mouth See admin instructions. 2.5 mg on Saturday All the other day take 5 mg) 90 tablet 0  . zinc gluconate 50 MG tablet Take 25 mg by mouth daily.      No current facility-administered medications for this visit.    Allergies:   Clarithromycin and Fluoride preparations    Social History:  The patient  reports that he has never smoked. He has never used smokeless tobacco. He reports that he does not drink alcohol or use drugs.   Family History:  The patient's family history includes Heart attack in his father; Kidney failure in his mother.  He indicated that his mother is deceased. He indicated that his father is deceased. He indicated that only one of his two sisters is alive. He indicated that all of his four brothers are alive.  ROS:  Please see the history of present illness. All other systems are reviewed and negative.   PHYSICAL EXAM: VS:  BP 131/90   Pulse 100   Temp 97.8 F (36.6 C)   Ht 6' 2.5" (1.892 m)   Wt 195 lb 12.8 oz (88.8 kg)   SpO2 97%   BMI 24.80 kg/m  , BMI Body mass index is 24.8 kg/m. GEN: Well nourished, well developed, male in no acute distress HEENT: normal for age  Neck: no JVD seen, difficult to assess due to his beard, no carotid bruit, no masses Cardiac: Irreg R&R; no murmur, no rubs, or gallops Respiratory: decreased BS bases bilaterally, normal work of breathing GI: soft, nontender, nondistended, + BS MS: no deformity or atrophy; no edema seen both legs and feet wrapped, not disturbed; distal pulses are 2+ in upper extremities  Skin: warm and dry, no rash Neuro:  Strength and sensation are intact Psych: euthymic mood, full affect   EKG:  EKG is not ordered today.   ECHO: 09/15/2019  1. Left ventricular ejection fraction, by visual estimation, is 20 to 25%. The  left ventricle has severely decreased function. There is mildly increased left ventricular hypertrophy.  2. Abnormal septal motion consistent with left bundle branch block.  3. Left ventricular diastolic parameters are indeterminate.  4. Mildly dilated left ventricular internal cavity size.  5. The left ventricle demonstrates global hypokinesis.  6. Global right ventricle has normal systolic function.The right ventricular size is normal. No increase in right  ventricular wall thickness.  7. Left atrial size was severely dilated.  8. Right atrial size was severely dilated.  9. Mild mitral annular calcification. 10. The mitral valve is grossly normal. Mild mitral valve regurgitation. 11. The tricuspid valve is grossly normal. Tricuspid valve regurgitation moderate. 12. The aortic valve is tricuspid and moderately calcificed with decreased cusp excursion. Gradients are not significantly increased, but in the setting of reduced LVEF, difficult to exclude low gradient aortic stenosis. Aortic valve regurgitation is not  visualized. 13. The pulmonic valve was grossly normal. Pulmonic valve regurgitation is trivial. 14. Moderately elevated pulmonary artery systolic pressure. 15. The tricuspid regurgitant velocity is 3.23 m/s, and with an assumed right atrial pressure of 15 mmHg, the estimated right ventricular systolic pressure is moderately elevated at 56.7 mmHg.  CATH: 10/04/2019  Widely patent coronary arteries, essentially normal for the patient's age.  Trivial mid LAD less than 25% eccentric plaque.  Mild pulmonary hypertension with mean pulmonary artery pressure of 31 mmHg  Severe left ventricular systolic dysfunction with EF less than 25%.  Elevated LVEDP at 21 mmHg consistent with acute on chronic combined systolic and diastolic heart failure.  Recent Labs: 06/07/2019: ALT 42 09/26/2019: B Natriuretic Peptide 1,263.0 10/05/2019: Hemoglobin 13.7; Platelets 189 10/13/2019: BUN 30; Creatinine,  Ser 1.14; Potassium 4.3; Sodium 139  CBC    Component Value Date/Time   WBC 8.4 10/05/2019 0054   RBC 3.97 (L) 10/05/2019 0054   HGB 13.7 10/05/2019 0054   HCT 42.7 10/05/2019 0054   PLT 189 10/05/2019 0054   MCV 107.6 (H) 10/05/2019 0054   MCH 34.5 (H) 10/05/2019 0054   MCHC 32.1 10/05/2019 0054   RDW 14.5 10/05/2019 0054   LYMPHSABS 0.9 10/05/2019 0054   MONOABS 1.1 (H) 10/05/2019 0054   EOSABS 0.0 10/05/2019 0054   BASOSABS 0.0 10/05/2019 0054   CMP Latest Ref Rng & Units 10/13/2019 10/04/2019 10/04/2019  Glucose 70 - 99 mg/dL 99 - -  BUN 8 - 23 mg/dL 30(H) - -  Creatinine 0.61 - 1.24 mg/dL 1.14 - -  Sodium 135 - 145 mmol/L 139 140 142  Potassium 3.5 - 5.1 mmol/L 4.3 3.9 3.7  Chloride 98 - 111 mmol/L 101 - -  CO2 22 - 32 mmol/L 27 - -  Calcium 8.9 - 10.3 mg/dL 9.5 - -  Total Protein 6.1 - 8.1 g/dL - - -  Total Bilirubin 0.2 - 1.2 mg/dL - - -  Alkaline Phos 39 - 117 units/L - - -  AST 10 - 35 U/L - - -  ALT 9 - 46 U/L - - -   Lipid Panel Lab Results  Component Value Date   CHOL 196 02/13/2011   HDL 46 02/13/2011   LDLCALC 132 (H) 02/13/2011   TRIG 92 02/13/2011   CHOLHDL 4.3 02/13/2011   Lab Results  Component Value Date   INR 1.4 (A) 10/09/2019   INR 1.4 (A) 10/09/2019   INR 1.6 (H) 10/04/2019     Wt Readings from Last 3 Encounters:  10/13/19 195 lb 12.8 oz (88.8 kg)  10/04/19 222 lb (100.7 kg)  10/04/19 222 lb (100.7 kg)     Other studies Reviewed: Additional studies/ records that were reviewed today include: Office notes, hospital records and testing.  ASSESSMENT AND PLAN:  1.  Chronic systolic CHF: - He has lost 7 lbs in the last 9 days - no evidence of volume overload now - will decrease the Lasix to alternating 1/2 and 1 tab, continue Arlyce Harman  w/out change - track wts and record - low Na diet - Spoke to pt, wife and daughter about a LifeVest. They are in favor of it. However, Dr Lovena Le was asked about it and said with NICM, there is no indication for  a LifeVest. If his EF does not improve on maximum medical therapy, consider ICD.      It was suggested that he might get better HR control with Toprol XL and have more room to start ARB or Entresto - will leave consideration of changing his Coreg to Toprol XL to Dr Bronson Ing - ck BMET today, if renal function stable on the Lasix, add low-dose Entresto  2. Chronic Afib - rate is still on the high side of normal, even with high-dose Coreg - ck BMET to make sure he is not dry - if BUN/Cr ok, consider changing Coreg to Toprol XL 75-100 mg bid to see if can get better HR control w/out as much BP effect.  3. Chronic anticoag w/ coumadin - INR has been subtherapeutic, f/u with PCP as scheduled   Current medicines are reviewed at length with the patient today.  The patient has concerns regarding medicines. Concerns were addressed  The following changes have been made:  Decrease Lasix  Labs/ tests ordered today include:   Orders Placed This Encounter  Procedures  . Basic Metabolic Panel (BMET)     Disposition:   FU with Kate Sable, MD  Signed, Rosaria Ferries, PA-C  10/13/2019 4:46 PM    [place] Phone: [phone]; Fax: [phone]

## 2019-10-13 ENCOUNTER — Ambulatory Visit (INDEPENDENT_AMBULATORY_CARE_PROVIDER_SITE_OTHER): Payer: Medicare HMO | Admitting: Physician Assistant

## 2019-10-13 ENCOUNTER — Other Ambulatory Visit (HOSPITAL_COMMUNITY)
Admission: RE | Admit: 2019-10-13 | Discharge: 2019-10-13 | Disposition: A | Discharge status: Home / Self Care | Payer: Medicare HMO | Source: Ambulatory Visit | Attending: Physician Assistant | Admitting: Physician Assistant

## 2019-10-13 ENCOUNTER — Other Ambulatory Visit: Payer: Self-pay

## 2019-10-13 ENCOUNTER — Encounter: Payer: Self-pay | Admitting: Physician Assistant

## 2019-10-13 VITALS — BP 131/90 | HR 100 | Temp 97.8°F | Ht 74.5 in | Wt 195.8 lb

## 2019-10-13 DIAGNOSIS — I5022 Chronic systolic (congestive) heart failure: Secondary | ICD-10-CM | POA: Insufficient documentation

## 2019-10-13 DIAGNOSIS — Z7901 Long term (current) use of anticoagulants: Secondary | ICD-10-CM | POA: Diagnosis not present

## 2019-10-13 DIAGNOSIS — I4821 Permanent atrial fibrillation: Secondary | ICD-10-CM

## 2019-10-13 LAB — BASIC METABOLIC PANEL
Anion gap: 11 (ref 5–15)
BUN: 30 mg/dL — ABNORMAL HIGH (ref 8–23)
CO2: 27 mmol/L (ref 22–32)
Calcium: 9.5 mg/dL (ref 8.9–10.3)
Chloride: 101 mmol/L (ref 98–111)
Creatinine, Ser: 1.14 mg/dL (ref 0.61–1.24)
GFR calc Af Amer: >60 mL/min (ref >60)
GFR calc non Af Amer: >60 mL/min (ref >60)
Glucose, Bld: 99 mg/dL (ref 70–99)
Potassium: 4.3 mmol/L (ref 3.5–5.1)
Sodium: 139 mmol/L (ref 135–145)

## 2019-10-13 MED ORDER — FUROSEMIDE 40 MG PO TABS: ORAL_TABLET | ORAL | 3 refills | Status: DC

## 2019-10-13 NOTE — Patient Instructions (Addendum)
WEIGH DAILY, every am, wearing the same amount of clothing Record weights, contact Kate Sable, MD for weight gain of 3 lbs in a day or 5 lbs in a week Limit sodium to 500 mg per meal, total 2000 mg per day Limit all liquids to 1.5-2 liters/quarts per day   Medication Instructions:   Change Lasix from 40 mg daily to 40 mg one day, then 20 mg the next and continue to alternate dose.  *If you need a refill on your cardiac medications before your next appointment, please call your pharmacy*  Lab Work: BMET today  If you have labs (blood work) drawn today and your tests are completely normal, you will receive your results only by: Marland Kitchen MyChart Message (if you have MyChart) OR . A paper copy in the mail If you have any lab test that is abnormal or we need to change your treatment, we will call you to review the results.  Testing/Procedures: NONE   Follow-Up: At [facility], you and your health needs are our priority.  As part of our continuing mission to provide you with exceptional heart care, we have created designated Provider Care Teams.  These Care Teams include your primary Cardiologist (physician) and Advanced Practice Providers (APPs -  Physician Assistants and Nurse Practitioners) who all work together to provide you with the care you need, when you need it.  Your next appointment:   1 month(s)  The format for your next appointment:   In Person  Provider:   Bernerd Pho, PA-C  Other Instructions  Call us if you cannot find your scale.   Two Gram Sodium Diet 2000 mg  What is Sodium? Sodium is a mineral found naturally in many foods. The most significant source of sodium in the diet is table salt, which is about 40% sodium.  Processed, convenience, and preserved foods also contain a large amount of sodium.  The body needs only 500 mg of sodium daily to function,  A normal diet provides more than enough sodium even if you do not use salt.  Why Limit Sodium? A  build up of sodium in the body can cause thirst, increased blood pressure, shortness of breath, and water retention.  Decreasing sodium in the diet can reduce edema and risk of heart attack or stroke associated with high blood pressure.  Keep in mind that there are many other factors involved in these health problems.  Heredity, obesity, lack of exercise, cigarette smoking, stress and what you eat all play a role.  General Guidelines:  Do not add salt at the table or in cooking.  One teaspoon of salt contains over 2 grams of sodium.  Read food labels  Avoid processed and convenience foods  Ask your dietitian before eating any foods not dicussed in the menu planning guidelines  Consult your physician if you wish to use a salt substitute or a sodium containing medication such as antacids.  Limit milk and milk products to 16 oz (2 cups) per day.  Shopping Hints:  READ LABELS!! "Dietetic" does not necessarily mean low sodium.  Salt and other sodium ingredients are often added to foods during processing.   Menu Planning Guidelines Food Group Choose More Often Avoid  Beverages (see also the milk group All fruit juices, low-sodium, salt-free vegetables juices, low-sodium carbonated beverages Regular vegetable or tomato juices, commercially softened water used for drinking or cooking  Breads and Cereals Enriched white, wheat, rye and pumpernickel bread, hard rolls and dinner rolls; muffins, cornbread  and waffles; most dry cereals, cooked cereal without added salt; unsalted crackers and breadsticks; low sodium or homemade bread crumbs Bread, rolls and crackers with salted tops; quick breads; instant hot cereals; pancakes; commercial bread stuffing; self-rising flower and biscuit mixes; regular bread crumbs or cracker crumbs  Desserts and Sweets Desserts and sweets mad with mild should be within allowance Instant pudding mixes and cake mixes  Fats Butter or margarine; vegetable oils; unsalted salad  dressings, regular salad dressings limited to 1 Tbs; light, sour and heavy cream Regular salad dressings containing bacon fat, bacon bits, and salt pork; snack dips made with instant soup mixes or processed cheese; salted nuts  Fruits Most fresh, frozen and canned fruits Fruits processed with salt or sodium-containing ingredient (some dried fruits are processed with sodium sulfites        Vegetables Fresh, frozen vegetables and low- sodium canned vegetables Regular canned vegetables, sauerkraut, pickled vegetables, and others prepared in brine; frozen vegetables in sauces; vegetables seasoned with ham, bacon or salt pork  Condiments, Sauces, Miscellaneous  Salt substitute with physician's approval; pepper, herbs, spices; vinegar, lemon or lime juice; hot pepper sauce; garlic powder, onion powder, low sodium soy sauce (1 Tbs.); low sodium condiments (ketchup, chili sauce, mustard) in limited amounts (1 tsp.) fresh ground horseradish; unsalted tortilla chips, pretzels, potato chips, popcorn, salsa (1/4 cup) Any seasoning made with salt including garlic salt, celery salt, onion salt, and seasoned salt; sea salt, rock salt, kosher salt; meat tenderizers; monosodium glutamate; mustard, regular soy sauce, barbecue, sauce, chili sauce, teriyaki sauce, steak sauce, Worcestershire sauce, and most flavored vinegars; canned gravy and mixes; regular condiments; salted snack foods, olives, picles, relish, horseradish sauce, catsup   Food preparation: Try these seasonings Meats:    Pork Sage, onion Serve with applesauce  Chicken Poultry seasoning, thyme, parsley Serve with cranberry sauce  Lamb Curry powder, rosemary, garlic, thyme Serve with mint sauce or jelly  Veal Marjoram, basil Serve with current jelly, cranberry sauce  Beef Pepper, bay leaf Serve with dry mustard, unsalted chive butter  Fish [place] leaf, dill Serve with unsalted lemon butter, unsalted parsley butter  Vegetables:    Asparagus Lemon juice    Broccoli Lemon juice   Carrots Mustard dressing parsley, mint, nutmeg, glazed with unsalted butter and sugar   Green beans Marjoram, lemon juice, nutmeg,dill seed   Tomatoes Basil, marjoram, onion   Spice /blend for [employer]" 4 tsp ground thyme 1 tsp ground sage 3 tsp ground rosemary 4 tsp ground marjoram   Test your knowledge 1. A product that says "Salt Free" may still contain sodium. True or False 2. Garlic Powder and Hot Pepper Sauce an be used as alternative seasonings.True or False 3. Processed foods have more sodium than fresh foods.  True or False 4. Canned Vegetables have less sodium than froze True or False  WAYS TO DECREASE YOUR SODIUM INTAKE 1. Avoid the use of added salt in cooking and at the table.  Table salt (and other prepared seasonings which contain salt) is probably one of the greatest sources of sodium in the diet.  Unsalted foods can gain flavor from the sweet, sour, and butter taste sensations of herbs and spices.  Instead of using salt for seasoning, try the following seasonings with the foods listed.  Remember: how you use them to enhance natural food flavors is limited only by your creativity... Allspice-Meat, fish, eggs, fruit, peas, red and yellow vegetables Almond Extract-Fruit baked goods Anise Seed-Sweet breads, fruit, carrots, beets, cottage  cheese, cookies (tastes like licorice) Basil-Meat, fish, eggs, vegetables, rice, vegetables salads, soups, sauces Bay Leaf-Meat, fish, stews, poultry Burnet-Salad, vegetables (cucumber-like flavor) Caraway Seed-Bread, cookies, cottage cheese, meat, vegetables, cheese, rice Cardamon-Baked goods, fruit, soups Celery Powder or seed-Salads, salad dressings, sauces, meatloaf, soup, bread.Do not use  celery salt Chervil-Meats, salads, fish, eggs, vegetables, cottage cheese (parsley-like flavor) Chili Power-Meatloaf, chicken cheese, corn, eggplant, egg dishes Chives-Salads cottage cheese, egg dishes, soups, vegetables,  sauces Cilantro-Salsa, casseroles Cinnamon-Baked goods, fruit, pork, lamb, chicken, carrots Cloves-Fruit, baked goods, fish, pot roast, green beans, beets, carrots Coriander-Pastry, cookies, meat, salads, cheese (lemon-orange flavor) Cumin-Meatloaf, fish,cheese, eggs, cabbage,fruit pie (caraway flavor) [employer], fruit, eggs, fish, poultry, cottage cheese, vegetables Dill Seed-Meat, cottage cheese, poultry, vegetables, fish, salads, bread Fennel Seed-Bread, cookies, apples, pork, eggs, fish, beets, cabbage, cheese, Licorice-like flavor Garlic-(buds or powder) Salads, meat, poultry, fish, bread, butter, vegetables, potatoes.Do not  use garlic salt Ginger-Fruit, vegetables, baked goods, meat, fish, poultry Horseradish Root-Meet, vegetables, butter Lemon Juice or Extract-Vegetables, fruit, tea, baked goods, fish salads Mace-Baked goods fruit, vegetables, fish, poultry (taste like nutmeg) Maple Extract-Syrups Marjoram-Meat, chicken, fish, vegetables, breads, green salads (taste like Sage) Mint-Tea, lamb, sherbet, vegetables, desserts, carrots, cabbage Mustard, Dry or Seed-Cheese, eggs, meats, vegetables, poultry Nutmeg-Baked goods, fruit, chicken, eggs, vegetables, desserts Onion Powder-Meat, fish, poultry, vegetables, cheese, eggs, bread, rice salads (Do not use   Onion salt) Orange Extract-Desserts, baked goods Oregano-Pasta, eggs, cheese, onions, pork, lamb, fish, chicken, vegetables, green salads Paprika-Meat, fish, poultry, eggs, cheese, vegetables Parsley Flakes-Butter, vegetables, meat fish, poultry, eggs, bread, salads (certain forms may   Contain sodium Pepper-Meat fish, poultry, vegetables, eggs Peppermint Extract-Desserts, baked goods Poppy Seed-Eggs, bread, cheese, fruit dressings, baked goods, noodles, vegetables, cottage  [employer], poultry, meat, fish, cauliflower, turnips,eggs bread Saffron-Rice, bread, veal, chicken, fish,  eggs Sage-Meat, fish, poultry, onions, eggplant, tomateos, pork, stews Savory-Eggs, salads, poultry, meat, rice, vegetables, soups, pork Tarragon-Meat, poultry, fish, eggs, butter, vegetables (licorice-like flavor)  Thyme-Meat, poultry, fish, eggs, vegetables, (clover-like flavor), sauces, soups Tumeric-Salads, butter, eggs, fish, rice, vegetables (saffron-like flavor) Vanilla Extract-Baked goods, candy Vinegar-Salads, vegetables, meat marinades Walnut Extract-baked goods, candy  2. Choose your Foods Wisely   The following is a list of foods to avoid which are high in sodium:  Meats-Avoid all smoked, canned, salt cured, dried and kosher meat and fish as well as Anchovies   [employer] meats:Bologna, Liverwurst, Pastrami Canned meat or fish  Marinated herring Caviar    Pepperoni Corned Beef   Pizza Dried chipped beef  Salami Frozen breaded fish or meat Salt pork Frankfurters or hot dogs  Sardines Gefilte fish   Sausage Ham (boiled ham, Proscuitto Smoked butt    spiced ham)   Spam      TV Dinners Vegetables Canned vegetables (Regular) Relish Canned mushrooms  Sauerkraut Olives    Tomato juice Pickles  Bakery and Dessert Products Canned puddings  Cream pies Cheesecake   Decorated cakes Cookies  Beverages/Juices Tomato juice, regular  Gatorade   V-8 vegetable juice, regular  Breads and Cereals Biscuit mixes   Salted potato chips, corn chips, pretzels Bread stuffing mixes  Salted crackers and rolls Pancake and waffle mixes Self-rising flour  Seasonings Accent    Meat sauces Barbecue sauce  Meat tenderizer Catsup    Monosodium glutamate (MSG) Celery salt   Onion salt Chili sauce   Prepared mustard Garlic salt   Salt, seasoned salt, sea salt Gravy mixes   Soy sauce Horseradish  Steak sauce Ketchup   Tartar sauce Lite salt    Teriyaki sauce Marinade mixes   Worcestershire sauce  Others Baking powder   Cocoa and cocoa mixes Baking soda   Commercial  casserole mixes Candy-caramels, chocolate  Dehydrated soups    Bars, fudge,nougats  Instant rice and pasta mixes Canned broth or soup  Maraschino cherries Cheese, aged and processed cheese and cheese spreads  Learning Assessment Quiz  Indicated T (for True) or F (for False) for each of the following statements:  1. _____ Fresh fruits and vegetables and unprocessed grains are generally low in sodium 2. _____ Water may contain a considerable amount of sodium, depending on the source 3. _____ You can always tell if a food is high in sodium by tasting it 4. _____ Certain laxatives my be high in sodium and should be avoided unless prescribed   by a physician or pharmacist 5. _____ Salt substitutes may be used freely by anyone on a sodium restricted diet 6. _____ Sodium is present in table salt, food additives and as a natural component of   most foods 7. _____ Table salt is approximately 90% sodium 8. _____ Limiting sodium intake may help prevent excess fluid accumulation in the body 9. _____ On a sodium-restricted diet, seasonings such as bouillon soy sauce, and    cooking wine should be used in place of table salt 10. _____ On an ingredient list, a product which lists monosodium glutamate as the first   ingredient is an appropriate food to include on a low sodium diet  Circle the best answer(s) to the following statements (Hint: there may be more than one correct answer)  11. On a low-sodium diet, some acceptable snack items are:    A. Olives  F. Bean dip   K. Grapefruit juice    B. Salted Pretzels G. Commercial Popcorn   L. Canned peaches    C. Carrot Sticks  H. Bouillon   M. Unsalted nuts   D. [place] fries  I. Peanut butter crackers N. Salami   E. Sweet pickles J. Tomato Juice   O. Pizza  12.  Seasonings that may be used freely on a reduced - sodium diet include   A. Lemon wedges F.Monosodium glutamate K. Celery seed    B.Soysauce   G. Pepper   L. Mustard powder   C. Sea  salt  H. Cooking wine  M. Onion flakes   D. Vinegar  E. Prepared horseradish N. Salsa   E. Sage   J. Worcestershire sauce  O. Chutney

## 2019-10-13 NOTE — Progress Notes (Signed)
[name], [name] ([id number]) Visit Report for 10/10/2019 Arrival Information Details Patient Name: Date of Service: [name], [name] 10/10/2019 8:00 AM Medical Record Number:[id number] Patient Account Number: [id number] Date of Birth/Sex: Treating RN: November 07, 1940 (79 y.o. [name] Primary Care Kinnick Maus: Hurshel Party Other Clinician: Referring Arye Weyenberg: Treating Akyla Vavrek/Extender: Rosina Lowenstein in Treatment: 53 Visit Information History Since Last Visit Added or deleted any medications: No Patient Arrived: Wheel Chair Any new allergies or adverse reactions: No Arrival Time: 08:14 Had a fall or experienced change in No Accompanied By: spouse activities of daily living that may affect Transfer Assistance: None risk of falls: Patient Identification Verified: Yes Signs or symptoms of abuse/neglect since last No Secondary Verification Process Yes visito Completed: Hospitalized since last visit: No Patient Requires Transmission- No Implantable device outside of the clinic excluding No Based Precautions: cellular tissue based products placed in the center Patient Has Alerts: Yes since last visit: Patient Alerts: Patient on Blood Has Dressing in Place as Prescribed: Yes Thinner Has Compression in Place as Prescribed: Yes Pain Present Now: No Electronic Signature(s) Signed: 10/10/2019 6:12:59 PM By: Baruch Gouty RN, BSN Entered By: Baruch Gouty on 10/10/2019 08:15:15 -------------------------------------------------------------------------------- Clinic Level of Care Assessment Details Patient Name: Date of Service: SAJID, RUPPERT 10/10/2019 8:00 AM Medical Record [place] Patient Account Number: [id number] Date of Birth/Sex: Treating RN: 25-May-1941 (79 y.o. [name]) [name] Primary Care Mara Favero: Hurshel Party Other Clinician: Referring Neyda Durango: Treating Chauncy Mangiaracina/Extender:Robson, Cheryl Flash, [place] Weeks in Treatment: 32 Clinic  Level of Care Assessment Items TOOL 4 Quantity Score X - Use when only an EandM is performed on FOLLOW-UP visit 1 0 ASSESSMENTS - Nursing Assessment / Reassessment X - Reassessment of Co-morbidities (includes updates in patient status) 1 10 X - Reassessment of Adherence to Treatment Plan 1 5 ASSESSMENTS - Wound and Skin Assessment / Reassessment _0  - Simple Wound Assessment / Reassessment - one wound 0 X - Complex Wound Assessment / Reassessment - multiple wounds 11 5 _1  - Dermatologic / Skin Assessment (not related to wound area) 0 ASSESSMENTS - Focused Assessment _2  - Circumferential Edema Measurements - multi extremities 0 _3  - Nutritional Assessment / Counseling / Intervention 0 _4  - Lower Extremity Assessment (monofilament, tuning fork, pulses) 0 _5  - Peripheral Arterial Disease Assessment (using hand held doppler) 0 ASSESSMENTS - Ostomy and/or Continence Assessment and Care _6  - Incontinence Assessment and Management 0 _7  - Ostomy Care Assessment and Management (repouching, etc.) 0 PROCESS - Coordination of Care X - Simple Patient / Family Education for ongoing care 1 15 _8  - Complex (extensive) Patient / Family Education for ongoing care 0 X - Staff obtains Programmer, systems, Records, Test Results / Process Orders 1 10 _9  - Staff telephones HHA, Nursing Homes / Clarify orders / etc 0 _10  - Routine Transfer to another Facility (non-emergent condition) 0 _11  - Routine Hospital Admission (non-emergent condition) 0 _12  - New Admissions / Biomedical engineer / Ordering NPWT, Apligraf, etc. 0 _13  - Emergency Hospital Admission (emergent condition) 0 X - Simple Discharge Coordination 1 10 _14  - Complex (extensive) Discharge Coordination 0 PROCESS - Special Needs _15  - Pediatric / Minor Patient Management 0 _16  - Isolation Patient Management 0 _17  - Hearing / Language / Visual special needs 0 _18  - Assessment of Community assistance (transportation, D/C planning, etc.) 0 _19  - Additional  assistance / Altered mentation 0 _20  - Support Surface(s) Assessment (bed, cushion, seat, etc.) 0 INTERVENTIONS - Wound Cleansing / Measurement _21  - Simple Wound Cleansing - one wound 0  X - Complex Wound Cleansing - multiple wounds 11 5 X - Wound Imaging (photographs - any number of wounds) 1 5 _0  - Wound Tracing (instead of photographs) 0 _1  - Simple Wound Measurement - one wound 0 X - Complex Wound Measurement - multiple wounds 11 5 INTERVENTIONS - Wound Dressings _2  - Small Wound Dressing one or multiple wounds 0 X - Medium Wound Dressing one or multiple wounds 2 15 X - Large Wound Dressing one or multiple wounds 2 20 <[id number]>_1<\/YNWGNFAOZHYQMVHQ>_4  - Application of Medications - topical 0 <ONGEXBMWUXLKGMWN>_0<\/UVOZDGUYQIHKVQQV>_9  - Application of Medications - injection 0 INTERVENTIONS - Miscellaneous _5  - External ear exam 0 _6  - Specimen Collection (cultures, biopsies, blood, body fluids, etc.) 0 _7  - Specimen(s) / Culture(s) sent or taken to Lab for analysis 0 _8  - Patient Transfer (multiple staff / Civil Service fast streamer / Similar devices) 0 _9  - Simple Staple / Suture removal (25 or less) 0 _10  - Complex Staple / Suture removal (26 or more) 0 _11  - Hypo / Hyperglycemic Management (close monitor of Blood Glucose) 0 _12  - Ankle / Brachial Index (ABI) - do not check if billed separately 0 X - Vital Signs 1 5 Has the patient been seen at the hospital within the last three years: Yes Total Score: 295 Level Of Care: New/Established - Level 5 Electronic Signature(s) Signed: 10/10/2019 6:18:05 PM By: [name] RN Entered By: [name] on 10/10/2019 09:22:56 -------------------------------------------------------------------------------- Encounter Discharge Information Details Patient Name: Date of Service: Daniel Pia D. 10/10/2019 8:00 AM Medical Record [id number] Patient Account Number: [id number] Date of Birth/Sex: Treating RN: 1941-01-06 (79 y.o. [name] Primary Care Suhayb Anzalone: Hurshel Party Other Clinician: Referring Alveta Quintela:  Treating Kacey Dysert/Extender:Robson, Cheryl Flash, Second [place] Weeks in Treatment: 71 Encounter Discharge Information Items Discharge Condition: Stable Ambulatory Status: Wheelchair Discharge Destination: Home Transportation: Private Auto Accompanied By: self Schedule Follow-up Appointment: Yes Clinical Summary of Care: Patient Declined Electronic Signature(s) Signed: 10/13/2019 5:57:08 PM By: Kela Millin Entered By: Kela Millin on 10/10/2019 10:04:11 -------------------------------------------------------------------------------- Lower Extremity Assessment Details Patient Name: Date of Service: [name], [name] 10/10/2019 8:00 AM Medical Record [id number] Patient Account Number: [id number] Date of Birth/Sex: Treating RN: [birth year] (79 y.o. [name] Primary Care Glorianna Gott: Hurshel Party Other Clinician: Referring Shona Pardo: Treating Nelissa Bolduc/Extender: Rosina Lowenstein in Treatment: 32 Edema Assessment Assessed: [Left: No] [Right: No] Edema: [Left: Yes] [Right: Yes] Calf Left: Right: Point of Measurement: 31 cm From Medial Instep 32.5 cm 30.7 cm Ankle Left: Right: Point of Measurement: 11 cm From Medial Instep 20.8 cm 20.2 cm Vascular Assessment Pulses: Dorsalis Pedis Palpable: [Left:No] [Right:No] Electronic Signature(s) Signed: 10/10/2019 6:12:59 PM By: Baruch Gouty RN, BSN Entered By: Baruch Gouty on 10/10/2019 08:34:53 -------------------------------------------------------------------------------- Multi Wound Chart Details Patient Name: Date of Service: Daniel Pia D. 10/10/2019 8:00 AM Medical Record [id number] Patient Account Number: [id number] Date of Birth/Sex: Treating RN: Jul 09, 1941 (79 y.o. M) Primary Care Patrice Moates: Hurshel Party Other Clinician: Referring Dorrene Bently: Treating Ewart Carrera/Extender:Robson, Cheryl Flash, [place] Weeks in Treatment: 32 Vital Signs Height(in): 74 Pulse(bpm): 62 Weight(lbs):  212 Blood Pressure(mmHg): 137/80 Body Mass Index(BMI): 27 Temperature(F): 98.3 Respiratory 20 Rate(breaths/min): Photos: [13:No Photos] [18:No Photos] [19:No Photos] Wound Location: [13:Left Toe Second] [18:Left Foot - Medial] [19:Left Lower Leg - Circumferential] Wounding Event: [13:Gradually Appeared] [18:Gradually Appeared] [19:Gradually Appeared] Primary Etiology: [13:Diabetic Wound/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] [18:Lower Extremity] Secondary Etiology: [13:N/A] [18:N/A] [19:N/A] Comorbid History: [13:Cataracts, Hypertension, Cataracts, Hypertension, Cataracts, Hypertension, Peripheral Venous Disease, Peripheral Venous Disease, Peripheral Venous Disease, Type  II Diabetes, Gout, Received Radiation] [18:Type II Diabetes, Gout,  Received Radiation] [19:Type II Diabetes, Gout, Received Radiation] Date Acquired: [13:08/22/2019] [18:09/03/2019] [19:09/23/2019] Weeks of Treatment: [13:7] [18:5] [19:2] Wound Status: [13:Open] [18:Open] [19:Open] Wound Recurrence: [13:No] [18:No] [19:No] Clustered Wound: [13:No] [18:No] [19:Yes] Clustered Quantity: [13:N/A] [18:N/A] [19:15] Measurements L x W x D 1.8x1.8x0.3 [18:0.5x0.3x0.1] [19:3.3x1.7x0.1] (cm) Area (cm) : [13:2.545] [18:0.118] [03:4.742] Volume (cm) : [13:0.763] [18:0.012] [19:0.441] % Reduction in Area: [13:17.30%] [18:88.40%] [19:91.80%] % Reduction in Volume: -147.70% [18:88.20%] [19:91.80%] Classification: [13:Grade 2] [18:Grade 2] [19:Full Thickness Without Exposed Support Structures] Exudate Amount: [13:Medium] [18:Small] [19:Medium] Exudate Type: [13:Serosanguineous] [18:Purulent] [19:Serosanguineous] Exudate Color: [13:red, brown] [18:yellow, brown, green] [19:red, brown] Wound Margin: [13:Distinct, outline attached] [18:Flat and Intact] [19:Flat and Intact] Granulation Amount: [13:None Present (0%)] [18:None Present (0%)] [19:Medium (34-66%)] Granulation Quality: [13:N/A] [18:N/A]  [19:Red, Pink] Necrotic Amount: [13:Large (67-100%)] [18:Large (67-100%)] [19:Medium (34-66%)] Exposed Structures: [13:Fat Layer (Subcutaneous Tissue) Exposed: Yes Joint: Yes Bone: Yes Fascia: No Tendon: No Muscle: No] [18:Fat Layer (Subcutaneous Tissue) Exposed: Yes Fascia: No Tendon: No Muscle: No Joint: No Bone: No] [19:Fat Layer (Subcutaneous Tissue) Exposed: Yes  Fascia: No Tendon: No Muscle: No Joint: No Bone: No] Epithelialization: [13:None 20] [18:None 21] [19:None 22] Photos: [13:No Photos] [18:No Photos] [19:No Photos] Wound Location: [13:Right Toe Great] [18:Right Toe Second] [19:Right Toe Third] Wounding Event: [13:Gradually Appeared] [18:Gradually Appeared] [19:Gradually Appeared] Primary Etiology: [13:Diabetic Wound/Ulcer of the Diabetic Wound/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] [18:Lower Extremity] [19:Lower Extremity] Secondary Etiology: [13:N/A] [18:N/A] [19:N/A] Comorbid History: [13:Cataracts, Hypertension, Cataracts, Hypertension, Cataracts, Hypertension, Peripheral Venous Disease, Peripheral Venous Disease, Peripheral Venous Disease, Type II Diabetes, Gout, Received Radiation] [18:Type II Diabetes, Gout,  Received Radiation] [19:Type II Diabetes, Gout, Received Radiation] Date Acquired: [13:09/23/2019] [18:09/23/2019] [19:09/23/2019] Weeks of Treatment: [13:2] [18:2] [19:2] Wound Status: [13:Open] [18:Open] [19:Open] Wound Recurrence: [13:No] [18:No] [19:No] Clustered Wound: [13:No] [18:No] [19:Yes] Clustered Quantity: [13:N/A] [18:N/A] [19:3] Measurements L x W x D 1.5x0.5x0.1 [18:1.2x1.1x0.1] [19:1.3x1.3x0.1] (cm) Area (cm) : [13:0.589] [18:1.037] [19:1.327] Volume (cm) : [13:0.059] [18:0.104] [19:0.133] % Reduction in Area: [13:-15.30%] [18:-57.10%] [19:-12.60%] % Reduction in Volume: -15.70% [18:-57.60%] [19:-12.70%] Classification: [13:Grade 2] [18:Grade 2] [19:Grade 2] Exudate Amount: [13:Small] [18:Small] [19:Small] Exudate Type:  [13:Serosanguineous] [18:Serosanguineous] [19:Serous] Exudate Color: [13:red, brown] [18:red, brown] [19:amber] Wound Margin: [13:Distinct, outline attached Flat and Intact] [19:Flat and Intact] Granulation Amount: [13:Small (1-33%)] [18:Medium (34-66%)] [19:None Present (0%)] Granulation Quality: [13:Pink] [18:Pink] [19:N/A] Necrotic Amount: [13:Large (67-100%)] [18:Medium (34-66%)] [19:Large (67-100%)] Exposed Structures: [13:Fat Layer (Subcutaneous Fat Layer (Subcutaneous Fat Layer (Subcutaneous Tissue) Exposed: Yes Fascia: No Tendon: No Muscle: No Joint: No Bone: No] [18:Tissue) Exposed: Yes Fascia: No Tendon: No Muscle: No Joint: No Bone: No] [19:Tissue) Exposed: Yes  Fascia: No Tendon: No Muscle: No Joint: No Bone: No] Epithelialization: [13:None 24] [18:None] [19:Small (1-33%) 25] Photos: [13:No Photos] [18:No Photos] [19:No Photos] Wound Location: [13:Left Lower Leg - Anterior, Distal] [18:Left Lower Leg - Posterior, Left Lower Leg - Medial Distal] Wounding Event: [13:Gradually Appeared] [18:Gradually Appeared] [19:Gradually Appeared] Primary Etiology: [13:Venous Leg Ulcer] [18:Venous Leg Ulcer] [19:Venous Leg Ulcer] Secondary Etiology: [13:Diabetic Wound/Ulcer of the Diabetic Wound/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] [18:Lower Extremity] [19:Lower Extremity] Comorbid History: [13:Cataracts, Hypertension, Cataracts, Hypertension, Cataracts, Hypertension, Peripheral Venous Disease, Peripheral Venous Disease, Peripheral Venous Disease, Type II Diabetes, Gout, Received Radiation] [18:Type II Diabetes, Gout,  Received Radiation] [19:Type II Diabetes, Gout, Received Radiation] Date Acquired: [13:10/04/2019] [18:10/04/2019] [19:10/04/2019] Weeks of Treatment: [13:0] [18:0] [19:0] Wound Status: [13:Open] [18:Open] [19:Open] Wound Recurrence: [13:No] [18:No] [  19:No] Clustered Wound: [13:No] [18:No] [19:No] Clustered Quantity: [13:N/A] [18:N/A] [19:N/A] Measurements L x W x D  5.8x1.4x0.1 [18:7x4.5x0.1] [19:1.7x0.7x0.1] (cm) Area (cm) : [13:6.377] [18:24.74] [19:0.935] Volume (cm) : [13:0.638] [18:2.474] [19:0.093] % Reduction in Area: [13:N/A] [18:N/A] [19:N/A] % Reduction in Volume: N/A [18:N/A] [19:N/A] Classification: [13:Full Thickness Without Exposed Support Structures Exposed Support Structures Exposed Support Structures] [18:Full Thickness Without] [19:Full Thickness Without] Exudate Amount: [13:Medium] [18:Medium] [19:Small] Exudate Type: [13:Serosanguineous] [18:Serosanguineous] [19:Serosanguineous] Exudate Color: [13:red, brown] [18:red, brown] [19:red, brown] Wound Margin: [13:Flat and Intact] [18:Flat and Intact] [19:Flat and Intact] Granulation Amount: [13:Medium (34-66%)] [18:Small (1-33%)] [19:Large (67-100%)] Granulation Quality: [13:Red] [18:Red, Pink] [19:Red] Necrotic Amount: [13:Medium (34-66%)] [18:Large (67-100%)] [19:None Present (0%)] Exposed Structures: [13:Fat Layer (Subcutaneous Fat Layer (Subcutaneous Fat Layer (Subcutaneous Tissue) Exposed: Yes Fascia: No Tendon: No Muscle: No Joint: No Bone: No] [18:Tissue) Exposed: Yes Fascia: No Tendon: No Muscle: No Joint: No Bone: No] [19:Tissue) Exposed: Yes  Fascia: No Tendon: No Muscle: No Joint: No Bone: No] Epithelialization: [13:Small (1-33%) 27] [18:Medium (34-66%)] [19:Small (1-33%) 3R N/A] Photos: [13:No Photos] [18:No Photos] [19:N/A] Wound Location: [13:Left Lower Leg - Lateral, Proximal] [18:Right Calf - Posterior] [19:N/A] Wounding Event: [13:Gradually Appeared] [18:Gradually Appeared] [19:N/A] Primary Etiology: [13:Venous Leg Ulcer] [18:Diabetic Wound/Ulcer of the N/A Lower Extremity] Secondary Etiology: [13:Diabetic Wound/Ulcer of the N/A Lower Extremity] [19:N/A] Comorbid History: [13:Cataracts, Hypertension, Cataracts, Hypertension, N/A Peripheral Venous Disease, Peripheral Venous Disease, Type II Diabetes, Gout, Received Radiation] [18:Type II Diabetes, Gout, Received  Radiation] Date Acquired: [13:10/04/2019] [18:02/28/2019] [19:N/A] Weeks of Treatment: [13:0] [18:32] [19:N/A] Wound Status: [13:Open] [18:Open] [19:N/A] Wound Recurrence: [13:No] [18:Yes] [19:N/A] Clustered Wound: [13:No] [18:No] [19:N/A] Clustered Quantity: [13:N/A] [18:N/A] [19:N/A] Measurements L x W x D 3.5x3.8x0.1 [18:1.8x1.4x0.1] [19:N/A] (cm) Area (cm) : [13:10.446] [18:1.979] [19:N/A] Volume (cm) : [13:1.045] [24:5.809] [19:N/A] % Reduction in Area: [13:N/A] [18:91.90%] [19:N/A] % Reduction in Volume: N/A [18:91.90%] [19:N/A] Classification: [13:Full Thickness Without Exposed Support Structures] [18:Grade 2] [19:N/A] Exudate Amount: [13:Medium] [18:Medium] [19:N/A] Exudate Type: [13:Serosanguineous] [18:Serosanguineous] [19:N/A] Exudate Color: [13:red, brown] [18:red, brown] [19:N/A] Wound Margin: [13:Flat and Intact] [18:Distinct, outline attached] [19:N/A] Granulation Amount: [13:Small (1-33%)] [18:Large (67-100%)] [19:N/A] Granulation Quality: [13:Pink] [18:Red] [19:N/A] Necrotic Amount: [13:Large (67-100%)] [18:Small (1-33%)] [19:N/A] Exposed Structures: [13:Fat Layer (Subcutaneous Fat Layer (Subcutaneous Tissue) Exposed: Yes Fascia: No Tendon: No Muscle: No Joint: No Bone: No Medium (34-66%)] [18:Tissue) Exposed: Yes Fascia: No Tendon: No Muscle: No Joint: No Bone: No Small (1-33%)] [19:N/A N/A] Treatment Notes Electronic Signature(s) Signed: 10/10/2019 6:16:53 PM By: [name] MD Entered By: [name] on 10/10/2019 09:20:57 -------------------------------------------------------------------------------- Multi-Disciplinary Care Plan Details Patient Name: Date of Service: Daniel Pia D. 10/10/2019 8:00 AM Medical Record [id number] Patient Account Number: [id number] Date of Birth/Sex: Treating RN: 12-Mar-1941 (78 y.o. [name]) [name] Primary Care Aneth Schlagel: Hurshel Party Other Clinician: Referring Naethan Bracewell: Treating Samin Milke/Extender: Rosina Lowenstein in Treatment: 32 Active Inactive Venous Leg Ulcer Nursing Diagnoses: Knowledge deficit related to disease process and management Potential for venous Insuffiency (use before diagnosis confirmed) Goals: Patient will maintain optimal edema control Date Initiated: 10/04/2019 Target Resolution Date: 11/01/2019 Goal Status: Active Interventions: Assess peripheral edema status every visit. Compression as ordered Treatment Activities: Therapeutic compression applied : 10/04/2019 Notes: Wound/Skin Impairment Nursing Diagnoses: Knowledge deficit related to ulceration/compromised skin integrity Goals: Patient/caregiver will verbalize understanding of skin care regimen Date Initiated: 02/28/2019 Target Resolution Date: 11/01/2019 Goal Status: Active Ulcer/skin breakdown will have a volume reduction of 30% by week 4 Date Initiated: 02/28/2019 Date Inactivated: 04/04/2019 Target Resolution Date: 03/31/2019 Goal Status: Met  Ulcer/skin breakdown will have a volume reduction of 50% by week 8 Date Initiated: 04/04/2019 Date Inactivated: 05/09/2019 Target Resolution Date: 05/05/2019 Goal Status: Met Ulcer/skin breakdown will have a volume reduction of 80% by week 12 Date Initiated: 05/09/2019 Date Inactivated: 06/13/2019 Target Resolution Date: 06/09/2019 Unmet Goal Status: Unmet Reason: comorbities/new wounds Ulcer/skin breakdown will heal within 14 weeks Date Initiated: 06/13/2019 Date Inactivated: 07/11/2019 Target Resolution Date: 07/07/2019 Unmet Goal Status: Unmet Reason: comorbityies Interventions: Assess patient/caregiver ability to obtain necessary supplies Assess patient/caregiver ability to perform ulcer/skin care regimen upon admission and as needed Assess ulceration(s) every visit Notes: Electronic Signature(s) Signed: 10/10/2019 6:18:05 PM By: [name] RN Entered By: [name] on 10/10/2019  07:58:09 -------------------------------------------------------------------------------- Pain Assessment Details Patient Name: Date of Service: [name], [name] 10/10/2019 8:00 AM Medical Record [id number] Patient Account Number: [id number] Date of Birth/Sex: Treating RN: 10-May-1941 (79 y.o. [name] Primary Care Dina Warbington: Hurshel Party Other Clinician: Referring Jarid Sasso: Treating Anisha Starliper/Extender: Rosina Lowenstein in Treatment: 52 Active Problems Location of Pain Severity and Description of Pain Patient Has Paino No Site Locations Rate the pain. Current Pain Level: 0 Character of Pain Describe the Pain: Tender Pain Management and Medication Current Pain Management: Electronic Signature(s) Signed: 10/10/2019 6:12:59 PM By: Baruch Gouty RN, BSN Entered By: Baruch Gouty on 10/10/2019 08:33:11 -------------------------------------------------------------------------------- Patient/Caregiver Education Details Patient Name: Date of Service: Back, Munir D. 1/12/2021andnbsp8:00 AM Medical Record [id number] Patient Account Number: [id number] Date of Birth/Gender: Treating RN: [birth year] (78 y.o. [name]) [name] Primary Care Physician: Hurshel Party Other Clinician: Referring Physician: Treating Physician/Extender: Rosina Lowenstein in Treatment: 43 Education Assessment Education Provided To: Patient Education Topics Provided Wound/Skin Impairment: Methods: Explain/Verbal Responses: State content correctly Electronic Signature(s) Signed: 10/10/2019 6:18:05 PM By: [name] RN Entered By: [name] on 10/10/2019 07:58:23 -------------------------------------------------------------------------------- Wound Assessment Details Patient Name: Date of Service: JAIVYN, GULLA 10/10/2019 8:00 AM Medical Record [id number] Patient Account Number: [id number] Date of Birth/Sex: Treating RN: 02/05/1941 (79 y.o. [name] Primary Care Tineshia Becraft: Hurshel Party Other Clinician: Referring Dreon Pineda: Treating Preston Garabedian/Extender: Rosina Lowenstein in Treatment: 32 Wound Status Wound Number: 13 Primary Diabetic Wound/Ulcer of the Lower Extremity Etiology: Wound Location: Left Toe Second Wound Open Wounding Event: Gradually Appeared Status: Date Acquired: 08/22/2019 Comorbid Cataracts, Hypertension, Peripheral Venous Weeks Of Treatment: 7 History: Disease, Type II Diabetes, Gout, Received Clustered Wound: No Radiation Photos Wound Measurements Length: (cm) 1.8 % Reduction in Width: (cm) 1.8 % Reduction in Depth: (cm) 0.3 Epithelializat Area: (cm) 2.545 Tunneling: Volume: (cm) 0.763 Undermining: Wound Description Classification: Grade 2 Foul Odor Afte Wound Margin: Distinct, outline attached Slough/Fibrino Exudate Amount: Medium Exudate Type: Serosanguineous Exudate Color: red, brown Wound Bed Granulation Amount: None Present (0%) Necrotic Amount: Large (67-100%) Fascia Exposed Necrotic Quality: Adherent Slough Fat Layer (Sub Tendon Exposed Muscle Exposed Joint Exposed: Bone Exposed: r Cleansing: No Yes Exposed Structure : No cutaneous Tissue) Exposed: Yes : No : No Yes Yes Area: 17.3% Volume: -147.7% ion: None No No Treatment Notes Wound #13 (Left Toe Second) 1. Cleanse With Wound Cleanser Soap and water 3. Primary Dressing Applied Calcium Alginate Ag 4. Secondary Dressing Dry Gauze Roll Gauze 5. Secured With Recruitment consultant) Signed: 10/12/2019 3:33:56 PM By: Mikeal Hawthorne EMT/HBOT Signed: 10/12/2019 6:38:38 PM By: Baruch Gouty RN, BSN Previous Signature: 10/10/2019 6:12:59 PM Version By: Baruch Gouty RN, BSN Entered By: Mikeal Hawthorne on 10/12/2019 13:16:25 -------------------------------------------------------------------------------- Wound Assessment Details Patient Name: Date of Service: Daniel Pia D. 10/10/2019 8:00 AM Medical  Record [id number] Patient Account Number: [id number] Date of Birth/Sex: Treating RN: 12-14-1940 (79 y.o. [name] Primary Care Asiah Browder: Hurshel Party Other Clinician: Referring Leston Schueller: Treating Zenora Karpel/Extender: Rosina Lowenstein in Treatment: 32 Wound Status Wound Number: 18 Primary Diabetic Wound/Ulcer of the Lower Extremity Etiology: Wound Location: Left Foot - Medial Wound Open Wounding Event: Gradually Appeared Status: Date Acquired: 09/03/2019 Comorbid Cataracts, Hypertension, Peripheral Venous Weeks Of Treatment: 5 History: Disease, Type II Diabetes, Gout, Received Clustered Wound: No Radiation Photos Wound Measurements Length: (cm) 0.5 Width: (cm) 0.3 Depth: (cm) 0.1 Area: (cm) 0.118 Volume: (cm) 0.012 Wound Description Classification: Grade 2 Wound Margin: Flat and Intact Exudate Amount: Small Exudate Type: Purulent Exudate Color: yellow, brown, green Wound Bed Granulation Amount: None Present (0%) Necrotic Amount: Large (67-100%) Necrotic Quality: Adherent Slough Foul Odor After Cleansing: No Slough/Fibrino Yes Exposed Structure Fascia Exposed: N Fat Layer (Subcutaneous Tissue) Exposed: Y Tendon Exposed: N Muscle Exposed: N Joint Exposed: N Bone Exposed: N % Reduction in Area: 88.4% % Reduction in Volume: 88.2% Epithelialization: None Tunneling: No Undermining: No o es o o o o Treatment Notes Wound #18 (Left, Medial Foot) 1. Cleanse With Wound Cleanser Soap and water 2. Periwound Care TCA Cream 3. Primary Dressing Applied Iodoflex 4. Secondary Dressing Dry Gauze 6. Support Layer Applied Kerlix/Coban Product manager) Signed: 10/12/2019 3:33:56 PM By: Mikeal Hawthorne EMT/HBOT Signed: 10/12/2019 6:38:38 PM By: Baruch Gouty RN, BSN Previous Signature: 10/10/2019 6:12:59 PM Version By: Baruch Gouty RN, BSN Entered By: Mikeal Hawthorne on 10/12/2019  13:17:30 -------------------------------------------------------------------------------- Wound Assessment Details Patient Name: Date of Service: [name], QUALE 10/10/2019 8:00 AM Medical Record [id number] Patient Account Number: [id number] Date of Birth/Sex: Treating RN: 1941/05/01 (79 y.o. [name] Primary Care Laryah Neuser: Hurshel Party Other Clinician: Referring Valbona Slabach: Treating Kionte Baumgardner/Extender: Rosina Lowenstein in Treatment: 32 Wound Status Wound Number: 19 Primary Venous Leg Ulcer Etiology: Wound Location: Left Lower Leg - Circumferential Wound Open Wounding Event: Gradually Appeared Status: Date Acquired: 09/23/2019 Date Acquired: 09/23/2019 Comorbid Cataracts, Hypertension, Peripheral Venous Weeks Of Treatment: 2 History: Disease, Type II Diabetes, Gout, Received Clustered Wound: Yes Radiation Photos Wound Measurements Length: (cm) 3.3 % Reduct Width: (cm) 1.7 % Reduct Depth: (cm) 0.1 Epitheli Clustered Quantity: 15 Tunnelin Area: (cm) 4.406 Undermi Volume: (cm) 0.441 Wound Description Full Thickness Without Exposed Support Foul Odo Classification: Structures Slough/F Wound Flat and Intact Margin: Exudate Medium Amount: Exudate Serosanguineous Type: Exudate red, brown Color: Wound Bed Granulation Amount: Medium (34-66%) Granulation Quality: Red, Pink Fascia E Necrotic Amount: Medium (34-66%) Fat Laye Necrotic Quality: Adherent Slough Tendon E Muscle E Joint Ex Bone Exp r After Cleansing: No ibrino Yes Exposed Structure xposed: No r (Subcutaneous Tissue) Exposed: Yes xposed: No xposed: No posed: No osed: No ion in Area: 91.8% ion in Volume: 91.8% alization: None g: No ning: No Treatment Notes Wound #19 (Left, Circumferential Lower Leg) 1. Cleanse With Wound Cleanser Soap and water 2. Periwound Care TCA Cream 3. Primary Dressing Applied Iodoflex 4. Secondary Dressing Dry Gauze 6. Support Layer  Applied Kerlix/Coban Product manager) Signed: 10/12/2019 3:33:56 PM By: Mikeal Hawthorne EMT/HBOT Signed: 10/12/2019 6:38:38 PM By: Baruch Gouty RN, BSN Previous Signature: 10/10/2019 6:12:59 PM Version By: Baruch Gouty RN, BSN Entered By: Mikeal Hawthorne on 10/12/2019 13:14:40 -------------------------------------------------------------------------------- Wound Assessment Details Patient Name: Date of Service: [name], [name] 10/10/2019 8:00 AM Medical Record [id number] Patient Account Number: [id number] Date of Birth/Sex: Treating RN: 29-Sep-1940 (79 y.o. [name] Primary Care Eliezer Khawaja: Hurshel Party  Other Clinician: Referring Merric Yost: Treating Francesco Provencal/Extender: Rosina Lowenstein in Treatment: 32 Wound Status Wound Number: 20 Primary Diabetic Wound/Ulcer of the Lower Extremity Etiology: Wound Location: Right Toe Great Wound Open Wounding Event: Gradually Appeared Status: Date Acquired: 09/23/2019 Comorbid Cataracts, Hypertension, Peripheral Venous Weeks Of Treatment: 2 History: Disease, Type II Diabetes, Gout, Received Clustered Wound: No Radiation Photos Wound Measurements Length: (cm) 1.5 Width: (cm) 0.5 Depth: (cm) 0.1 Area: (cm) 0.589 Volume: (cm) 0.059 Wound Description Classification: Grade 2 Wound Margin: Distinct, outline attached Exudate Amount: Small Exudate Type: Serosanguineous Exudate Color: red, brown Wound Bed Granulation Amount: Small (1-33%) Granulation Quality: Pink Necrotic Amount: Large (67-100%) Necrotic Quality: Adherent Slough After Cleansing: No brino Yes Exposed Structure posed: No (Subcutaneous Tissue) Exposed: Yes posed: No posed: No osed: No sed: No % Reduction in Area: -15.3% % Reduction in Volume: -15.7% Epithelialization: None Tunneling: No Undermining: No Foul Odor Slough/Fi Fascia Ex Fat Layer Tendon Ex Muscle Ex Joint Exp Bone Expo Treatment Notes Wound #20 (Right  Toe Great) 1. Cleanse With Wound Cleanser Soap and water 3. Primary Dressing Applied Calcium Alginate Ag 4. Secondary Dressing Dry Gauze Roll Gauze 5. Secured With Recruitment consultant) Signed: 10/12/2019 3:33:56 PM By: Mikeal Hawthorne EMT/HBOT Signed: 10/12/2019 6:38:38 PM By: Baruch Gouty RN, BSN Previous Signature: 10/10/2019 6:12:59 PM Version By: Baruch Gouty RN, BSN Entered By: Mikeal Hawthorne on 10/12/2019 13:39:42 -------------------------------------------------------------------------------- Wound Assessment Details Patient Name: Date of Service: [name], [name] 10/10/2019 8:00 AM Medical Record [id number] Patient Account Number: [id number] Date of Birth/Sex: Treating RN: 13-Oct-1940 (79 y.o. 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, Type II Diabetes, Gout, Received Clustered Wound: No Radiation Photos Wound Measurements Length: (cm) 1.2 Width: (cm) 1.1 Depth: (cm) 0.1 Area: (cm) 1.037 Volume: (cm) 0.104 Wound Description Classification: Grade 2 Wound Margin: Flat and Intact Exudate Amount: Small Exudate Type: Serosanguineous Exudate Color: red, brown Wound Bed Granulation Amount: Medium (34-66%) Granulation Quality: Pink Necrotic Amount: Medium (34-66%) Necrotic Quality: Adherent Slough After Cleansing: No brino Yes Exposed Structure posed: No (Subcutaneous Tissue) Exposed: Yes posed: No posed: No osed: No sed: No % Reduction in Area: -57.1% % Reduction in Volume: -57.6% Epithelialization: None Tunneling:  No Undermining: No Foul Odor Slough/Fi Fascia Ex Fat Layer Tendon Ex Muscle Ex Joint Exp Bone Expo Treatment Notes Wound #21 (Right Toe Second) 1. Cleanse With Wound Cleanser Soap and water 3. Primary Dressing Applied Calcium Alginate Ag 4. Secondary Dressing Dry Gauze Roll Gauze 5. Secured With Recruitment consultant) Signed: 10/12/2019 3:33:56 PM By: Mikeal Hawthorne EMT/HBOT Signed: 10/12/2019 6:38:38 PM By: Baruch Gouty RN, BSN Previous Signature: 10/10/2019 6:12:59 PM Version By: Baruch Gouty RN, BSN Entered By: Mikeal Hawthorne on 10/12/2019 13:34:30 -------------------------------------------------------------------------------- Wound Assessment Details Patient Name: Date of Service: [name], [name] 10/10/2019 8:00 AM Medical Record [id number] Patient Account Number: [id number] Date of Birth/Sex: Treating RN: 18-Sep-1941 (79 y.o. [name] Primary Care Steve Gregg: Hurshel Party Other Clinician: Referring Jayion Schneck: Treating Rebecca Motta/Extender: Rosina Lowenstein in Treatment: 32 Wound Status Wound Number: 22 Primary Diabetic Wound/Ulcer of the Lower Extremity Etiology: Wound Location: Right Toe Third Wound Open Wounding Event: Gradually Appeared Status: Date Acquired: 09/23/2019 Comorbid Cataracts, Hypertension, Peripheral Venous Weeks Of Treatment:  2 History: Disease, Type II Diabetes, Gout, Received Clustered Wound: Yes Radiation Photos Wound Measurements Length: (cm) 1.3 % Reduction in A Width: (cm) 1.3 % Reduction in V Depth: (cm) 0.1 Epithelializatio Clustered Quantity: 3 Tunneling: Area: (cm) 1.327 Undermining: Volume: (cm) 0.133 Wound Description Classification: Grade 2 Foul Odor After Wound Margin: Flat and Intact Slough/Fibrino Exudate Amount: Small Exudate Type: Serous Exudate Color: amber Wound Bed Granulation Amount: None Present (0%) Necrotic Amount: Large (67-100%) Fascia Exposed: Necrotic Quality:  Adherent Slough Fat Layer (Subcu Tendon Exposed: Muscle Exposed: Joint Exposed: Bone Exposed: Cleansing: No Yes Exposed Structure No taneous Tissue) Exposed: Yes No No No No rea: -12.6% olume: -12.7% n: Small (1-33%) No No Treatment Notes Wound #22 (Right Toe Third) 1. Cleanse With Wound Cleanser Soap and water 3. Primary Dressing Applied Calcium Alginate Ag 4. Secondary Dressing Dry Gauze Roll Gauze 5. Secured With Recruitment consultant) Signed: 10/12/2019 3:33:56 PM By: Mikeal Hawthorne EMT/HBOT Signed: 10/12/2019 6:38:38 PM By: Baruch Gouty RN, BSN Previous Signature: 10/10/2019 6:12:59 PM Version By: Baruch Gouty RN, BSN Entered By: Mikeal Hawthorne on 10/12/2019 13:38:36 -------------------------------------------------------------------------------- Wound Assessment Details Patient Name: Date of Service: [name], [name] 10/10/2019 8:00 AM Medical Record [id number] Patient Account Number: [id number] Date of Birth/Sex: Treating RN: [birth year] (79 y.o. [name] Primary Care Reiko Vinje: Hurshel Party Other Clinician: Referring Kameron Glazebrook: Treating Dain Laseter/Extender: Rosina Lowenstein in Treatment: 32 Wound Status Wound Number: 24 Primary Venous Leg Ulcer Etiology: Wound Location: Left Lower Leg - Anterior, Distal Secondary Diabetic Wound/Ulcer of the Lower Extremity Wounding Event: Gradually Appeared Etiology: Date Acquired: 10/04/2019 Wound Open Weeks Of Treatment: 0 Status: Clustered Wound: No Comorbid Cataracts, Hypertension, Peripheral Venous History: Disease, Type II Diabetes, Gout, Received Radiation Photos Wound Measurements Length: (cm) 5.8 % Reduct Width: (cm) 1.4 % Reduct Depth: (cm) 0.1 Epitheli Area: (cm) 6.377 Tunneli Volume: (cm) 0.638 Undermi Wound Description Classification: Full Thickness Without Exposed Support Foul Odo Structures Slough/F Wound Flat and  Intact Margin: Exudate Medium Amount: Exudate Serosanguineous Type: Exudate red, brown red, brown Color: Wound Bed Granulation Amount: Medium (34-66%) Granulation Quality: Red Fascia E Necrotic Amount: Medium (34-66%) Fat Laye Necrotic Quality: Adherent Slough Tendon E Muscle E Joint Ex Bone Exp r After Cleansing: No ibrino No Exposed Structure xposed: No r (Subcutaneous Tissue) Exposed: Yes xposed: No xposed: No posed: No osed: No ion in Area: 0% ion in Volume: 0% alization: Small (1-33%) ng: No ning: No Treatment Notes Wound #24 (Left, Distal, Anterior Lower Leg) 1. Cleanse With Wound Cleanser Soap and water 2. Periwound Care TCA Cream 3. Primary Dressing Applied Iodoflex 4. Secondary Dressing Dry Gauze 6. Support Layer Applied Kerlix/Coban Product manager) Signed: 10/12/2019 3:33:56 PM By: Mikeal Hawthorne EMT/HBOT Signed: 10/12/2019 6:38:38 PM By: Baruch Gouty RN, BSN Previous Signature: 10/10/2019 6:12:59 PM Version By: Baruch Gouty RN, BSN Entered By: Mikeal Hawthorne on 10/12/2019 13:14:10 -------------------------------------------------------------------------------- Wound Assessment Details Patient Name: Date of Service: [name], [name] 10/10/2019 8:00 AM Medical Record [id number] Patient Account Number: [id number] Date of Birth/Sex: Treating RN: 1941/02/04 (79 y.o. [name] Primary Care Shannyn Jankowiak: Hurshel Party Other Clinician: Referring Williom Cedar: Treating Dawsen Krieger/Extender: Rosina Lowenstein in Treatment: 32 Wound Status Wound Number: 25 Primary Venous Leg Ulcer Etiology: Wound Location: Left Lower Leg - Posterior, Distal Secondary Diabetic Wound/Ulcer of the Lower Extremity Wounding Event: Gradually Appeared Etiology: Date Acquired: 10/04/2019 Wound Open Weeks Of Treatment: 0 Status: Clustered Wound: No Comorbid Cataracts, Hypertension, Peripheral Venous History: Disease, Type II Diabetes,  Gout, Received Radiation  Photos Wound Measurements Length: (cm) 7 % Reduct Width: (cm) 4.5 % Reduct Depth: (cm) 0.1 Epitheli Area: (cm) 24.74 Tunneli Volume: (cm) 2.474 Undermi Wound Description Full Thickness Without Exposed Support Foul Odo Classification: Structures Slough/F Wound Flat and Intact Margin: Exudate Medium Amount: Exudate Serosanguineous Type: Exudate red, brown Color: Wound Bed Granulation Amount: Small (1-33%) Granulation Quality: Red, Pink Fascia E Necrotic Amount: Large (67-100%) Fat Laye Necrotic Quality: Adherent Slough Tendon E Muscle E Joint Ex Bone Exp r After Cleansing: No ibrino Yes Exposed Structure xposed: No r (Subcutaneous Tissue) Exposed: Yes xposed: No xposed: No posed: No osed: No ion in Area: 0% ion in Volume: 0% alization: Medium (34-66%) ng: No ning: No Treatment Notes Wound #25 (Left, Distal, Posterior Lower Leg) 1. Cleanse With Wound Cleanser Soap and water 2. Periwound Care TCA Cream 3. Primary Dressing Applied Iodoflex 4. Secondary Dressing Dry Gauze 6. Support Layer Applied Kerlix/Coban Product manager) Signed: 10/12/2019 3:33:56 PM By: Mikeal Hawthorne EMT/HBOT Signed: 10/12/2019 6:38:38 PM By: Baruch Gouty RN, BSN Previous Signature: 10/10/2019 6:12:59 PM Version By: Baruch Gouty RN, BSN Entered By: Mikeal Hawthorne on 10/12/2019 13:13:41 -------------------------------------------------------------------------------- Wound Assessment Details Patient Name: Date of Service: [name], [name] 10/10/2019 8:00 AM Medical Record [id number] Patient Account Number: [id number] Date of Birth/Sex: Treating RN: 1940/10/31 (79 y.o. [name] Primary Care Stanisha Lorenz: Hurshel Party Other Clinician: Referring Riley Hallum: Treating Lyrique Hakim/Extender: Rosina Lowenstein in Treatment: 32 Wound Status Wound Number: 26 Primary Venous Leg Ulcer Etiology: Wound Location: Left Lower Leg  - Medial Secondary Diabetic Wound/Ulcer of the Lower Extremity Wounding Event: Gradually Appeared Etiology: Date Acquired: 10/04/2019 Wound Open Weeks Of Treatment: 0 Status: Clustered Wound: No Comorbid Cataracts, Hypertension, Peripheral Venous History: Disease, Type II Diabetes, Gout, Received Radiation Photos Wound Measurements Length: (cm) 1.7 % Reduct Width: (cm) 0.7 % Reduct Depth: (cm) 0.1 Epitheli Area: (cm) 0.935 Tunneli Volume: (cm) 0.093 Undermi Wound Description Full Thickness Without Exposed Support Foul Odo Classification: Structures Slough/F Wound Flat and Intact Margin: Exudate Small Amount: Exudate Serosanguineous Type: Exudate red, brown Color: Wound Bed Granulation Amount: Large (67-100%) Granulation Quality: Red Fascia E Necrotic Amount: None Present (0%) Fat Laye Tendon E Muscle E Joint Ex Bone Exp r After Cleansing: No ibrino No Exposed Structure xposed: No r (Subcutaneous Tissue) Exposed: Yes xposed: No xposed: No posed: No osed: No ion in Area: 0% ion in Volume: 0% alization: Small (1-33%) ng: No ning: No Treatment Notes Wound #26 (Left, Medial Lower Leg) 1. Cleanse With Wound Cleanser Soap and water 2. Periwound Care TCA Cream 3. Primary Dressing Applied Iodoflex 4. Secondary Dressing Dry Gauze 6. Support Layer Applied Kerlix/Coban Product manager) Signed: 10/12/2019 3:33:56 PM By: Mikeal Hawthorne EMT/HBOT Signed: 10/12/2019 6:38:38 PM By: Baruch Gouty RN, BSN Previous Signature: 10/10/2019 6:12:59 PM Version By: Baruch Gouty RN, BSN Entered By: Mikeal Hawthorne on 10/12/2019 13:10:14 -------------------------------------------------------------------------------- Wound Assessment Details Patient Name: Date of Service: [name], [name] 10/10/2019 8:00 AM Medical Record [id number] Patient Account Number: [id number] Date of Birth/Sex: Treating RN: [birth year] (79 y.o. [name] Primary Care Tecia Cinnamon: Hurshel Party Other Clinician: Referring Vladimir Lenhoff: Treating Aspen Deterding/Extender: Rosina Lowenstein in Treatment: 32 Wound Status Wound Number: 27 Primary Venous Leg Ulcer Etiology: Wound Location: Left Lower Leg - Lateral, Proximal Secondary Diabetic Wound/Ulcer of the Lower Extremity Wounding Event: Gradually Appeared Etiology: Date Acquired: 10/04/2019 Wound Open Weeks Of Treatment: 0 Status: Clustered Wound: No Comorbid Cataracts, Hypertension, Peripheral Venous History: Disease, Type II Diabetes, Gout, Received Radiation  Photos Wound Measurements Length: (cm) 3.5 % Reduct Width: (cm) 3.8 % Reduct Depth: (cm) 0.1 Epitheli Area: (cm) 10.446 Tunneli Volume: (cm) 1.045 Undermi Wound Description Classification: Full Thickness Without Exposed Support Foul Odo Structures Slough/F Wound Flat and Intact Margin: Exudate Medium Amount: Exudate Serosanguineous Type: Exudate red, brown Color: Wound Bed Granulation Amount: Small (1-33%) Granulation Quality: Pink Fascia E Necrotic Amount: Large (67-100%) Fat Laye Necrotic Quality: Adherent Slough Tendon E Muscle E Joint Ex Bone Exp r After Cleansing: No ibrino Yes Exposed Structure xposed: No r (Subcutaneous Tissue) Exposed: Yes xposed: No xposed: No posed: No osed: No ion in Area: 0% ion in Volume: 0% alization: Medium (34-66%) ng: No ning: No Treatment Notes Wound #27 (Left, Proximal, Lateral Lower Leg) 1. Cleanse With Wound Cleanser Soap and water 2. Periwound Care TCA Cream 3. Primary Dressing Applied Iodoflex 4. Secondary Dressing Dry Gauze 6. Support Layer Applied Kerlix/Coban Product manager) Signed: 10/12/2019 3:33:56 PM By: Mikeal Hawthorne EMT/HBOT Signed: 10/12/2019 6:38:38 PM By: Baruch Gouty RN, BSN Previous Signature: 10/10/2019 6:12:59 PM Version By: Baruch Gouty RN, BSN Entered By: Mikeal Hawthorne on 10/12/2019  13:09:48 -------------------------------------------------------------------------------- Wound Assessment Details Patient Name: Date of Service: PEPE, MINEAU 10/10/2019 8:00 AM Medical Record [id number] Patient Account Number: [id number] Date of Birth/Sex: Treating RN: 15-Dec-1940 (79 y.o. [name] Primary Care Jamiesha Victoria: Hurshel Party Other Clinician: Referring Skyley Grandmaison: Treating Anni Hocevar/Extender: Rosina Lowenstein in Treatment: 32 Wound Status Wound Number: 3R Primary Diabetic Wound/Ulcer of the Lower Extremity Etiology: Wound Location: Right Calf - Posterior Wound Open Wounding Event: Gradually Appeared Status: Date Acquired: 02/28/2019 Comorbid Cataracts, Hypertension, Peripheral Venous Weeks Of Treatment: 32 History: Disease, Type II Diabetes, Gout, Received Clustered Wound: No Radiation Photos Wound Measurements Length: (cm) 1.8 % Reduction i Width: (cm) 1.4 % Reduction i Depth: (cm) 0.1 Epithelializa Area: (cm) 1.979 Tunneling: Volume: (cm) 0.198 Undermining: Wound Description Classification: Grade 2 Foul Odor Aft Wound Margin: Distinct, outline attached Slough/Fibrin Exudate Amount: Medium Exudate Type: Serosanguineous Exudate Color: red, brown Wound Bed Granulation Amount: Large (67-100%) Granulation Quality: Red Fascia Expose Necrotic Amount: Small (1-33%) Fat Layer (Su Necrotic Quality: Adherent Slough Tendon Expose Muscle Expose Joint Exposed Bone Exposed: Treatment Notes Wound #3R (Right, Posterior Calf) 1. Cleanse With Wound Cleanser Soap and water 2. Periwound Care TCA Cream 3. Primary Dressing Applied Iodoflex 4. Secondary Dressing Dry Gauze 6. Support Layer Applied Kerlix/Coban [employer] er Cleansing: No o Yes Exposed Structure d: No bcutaneous Tissue) Exposed: Yes d: No d: No : No No n Area: 91.9% n Volume: 91.9% tion: Small (1-33%) No No Electronic Signature(s) Signed: 10/12/2019 3:33:56 PM By:  Mikeal Hawthorne EMT/HBOT Signed: 10/12/2019 6:38:38 PM By: Baruch Gouty RN, BSN Previous Signature: 10/10/2019 6:12:59 PM Version By: Baruch Gouty RN, BSN Entered By: Mikeal Hawthorne on 10/12/2019 13:33:58 -------------------------------------------------------------------------------- Kyle Details Patient Name: Date of Service: Daniel Pia D. 10/10/2019 8:00 AM Medical Record [id number] Patient Account Number: [id number] Date of Birth/Sex: Treating RN: May 13, 1941 (79 y.o. [name] Primary Care Jhonnie Aliano: Hurshel Party Other Clinician: Referring Shakirah Kirkey: Treating Doyl Bitting/Extender: Rosina Lowenstein in Treatment: 32 Vital Signs Time Taken: 08:15 Temperature (F): 98.3 Height (in): 74 Pulse (bpm): 84 Source: Stated Respiratory Rate (breaths/min): 20 Weight (lbs): 212 Blood Pressure (mmHg): 137/80 Source: Stated Reference Range: 80 - 120 mg / dl Body Mass Index (BMI): 27.2 Electronic Signature(s) Signed: 10/10/2019 6:12:59 PM By: Baruch Gouty RN, BSN Entered By: Baruch Gouty on 10/10/2019 08:33:03

## 2019-10-16 ENCOUNTER — Other Ambulatory Visit: Payer: Self-pay

## 2019-10-16 ENCOUNTER — Telehealth: Payer: Self-pay | Admitting: Licensed Clinical Social Worker

## 2019-10-16 ENCOUNTER — Ambulatory Visit (INDEPENDENT_AMBULATORY_CARE_PROVIDER_SITE_OTHER): Payer: Medicare HMO | Admitting: *Deleted

## 2019-10-16 DIAGNOSIS — I4891 Unspecified atrial fibrillation: Secondary | ICD-10-CM | POA: Diagnosis not present

## 2019-10-16 DIAGNOSIS — Z5181 Encounter for therapeutic drug level monitoring: Secondary | ICD-10-CM | POA: Diagnosis not present

## 2019-10-16 LAB — POCT INR: INR: 2.6 (ref 2.0–3.0)

## 2019-10-16 NOTE — Telephone Encounter (Signed)
CSW referred to assist patient with obtaining a scale and BP cuff. CSW contacted patient to inform scale and cuff will be delivered to home. Patient grateful for support and assistance. CSW available as needed. Jackie Junious Ragone, LCSW, CCSW-MCS [phone]  

## 2019-10-16 NOTE — Patient Instructions (Signed)
Continue warfarin 1 tablet daily except 1/2 tablet on Saturdays Recheck in 2 weeks

## 2019-10-17 ENCOUNTER — Encounter (HOSPITAL_BASED_OUTPATIENT_CLINIC_OR_DEPARTMENT_OTHER): Payer: Medicare HMO | Attending: Internal Medicine | Admitting: Internal Medicine

## 2019-10-17 DIAGNOSIS — S91302A Unspecified open wound, left foot, initial encounter: Secondary | ICD-10-CM | POA: Diagnosis not present

## 2019-10-17 DIAGNOSIS — L97511 Non-pressure chronic ulcer of other part of right foot limited to breakdown of skin: Secondary | ICD-10-CM | POA: Diagnosis not present

## 2019-10-17 DIAGNOSIS — L03116 Cellulitis of left lower limb: Secondary | ICD-10-CM | POA: Diagnosis not present

## 2019-10-17 DIAGNOSIS — I890 Lymphedema, not elsewhere classified: Secondary | ICD-10-CM | POA: Diagnosis not present

## 2019-10-17 DIAGNOSIS — E1151 Type 2 diabetes mellitus with diabetic peripheral angiopathy without gangrene: Secondary | ICD-10-CM | POA: Diagnosis not present

## 2019-10-17 DIAGNOSIS — S91105A Unspecified open wound of left lesser toe(s) without damage to nail, initial encounter: Secondary | ICD-10-CM | POA: Diagnosis not present

## 2019-10-17 DIAGNOSIS — L97524 Non-pressure chronic ulcer of other part of left foot with necrosis of bone: Secondary | ICD-10-CM | POA: Diagnosis not present

## 2019-10-17 DIAGNOSIS — E11621 Type 2 diabetes mellitus with foot ulcer: Secondary | ICD-10-CM | POA: Diagnosis not present

## 2019-10-17 DIAGNOSIS — L97822 Non-pressure chronic ulcer of other part of left lower leg with fat layer exposed: Secondary | ICD-10-CM | POA: Diagnosis not present

## 2019-10-17 DIAGNOSIS — L97221 Non-pressure chronic ulcer of left calf limited to breakdown of skin: Secondary | ICD-10-CM | POA: Diagnosis not present

## 2019-10-17 DIAGNOSIS — E1142 Type 2 diabetes mellitus with diabetic polyneuropathy: Secondary | ICD-10-CM | POA: Diagnosis not present

## 2019-10-17 DIAGNOSIS — L97211 Non-pressure chronic ulcer of right calf limited to breakdown of skin: Secondary | ICD-10-CM | POA: Diagnosis not present

## 2019-10-17 DIAGNOSIS — I87312 Chronic venous hypertension (idiopathic) with ulcer of left lower extremity: Secondary | ICD-10-CM | POA: Diagnosis not present

## 2019-10-18 ENCOUNTER — Other Ambulatory Visit: Payer: Self-pay | Admitting: Physician Assistant

## 2019-10-18 ENCOUNTER — Telehealth: Payer: Self-pay | Admitting: Physician Assistant

## 2019-10-18 DIAGNOSIS — I4891 Unspecified atrial fibrillation: Secondary | ICD-10-CM

## 2019-10-18 MED ORDER — SACUBITRIL-VALSARTAN 24-26 MG PO TABS: 1.0000 | ORAL_TABLET | Freq: Two times a day (BID) | ORAL | 0 refills | Status: DC

## 2019-10-18 NOTE — Telephone Encounter (Signed)
Spoke to pt wife again. Let her know I will call in Rx for Entresto 24-26. Informed I will mail pt lab slips and Entresto 30 day free card. Pt wife stated she could not make it to our office today from [place].   Called in sacubitril-valsartan 24-26 mg BID #60 w/0 refills to [facility] in [place]. Informed PharmD that pt will bring 30 day free card with them when they come in to pick up Rx.

## 2019-10-18 NOTE — Progress Notes (Signed)
[name], [name] ([id number]) Visit Report for 10/17/2019 Debridement Details Patient Name: Date of Service: [name], [name] 10/17/2019 8:15 AM Medical Record [place] Patient Account Number: [id number] Date of Birth/Sex: Treating RN: 24-Jan-1941 (79 y.o. [name]) [name] Primary Care Provider: Hurshel Party Other Clinician: Referring Provider: Treating Provider/Extender:Loye Reininger, Cheryl Flash, [facility] Weeks in Treatment: 33 Debridement Performed for Wound #19 Left,Anterior Lower Leg Assessment: Performed By: Physician Ricard Dillon., MD Debridement Type: Debridement Severity of Tissue Pre Fat layer exposed Debridement: Level of Consciousness (Pre- Awake and Alert procedure): Pre-procedure Verification/Time Out Taken: Yes - 09:07 Start Time: 09:07 Pain Control: Lidocaine 5% topical ointment Total Area Debrided (L x W): 2.5 (cm) x 1.4 (cm) = 3.5 (cm) Tissue and other material Viable, Non-Viable, Slough, Subcutaneous, Slough debrided: Level: Skin/Subcutaneous Tissue Debridement Description: Excisional Instrument: Curette Bleeding: Moderate Hemostasis Achieved: Pressure End Time: 09:14 Procedural Pain: 3 Post Procedural Pain: 0 Response to Treatment: Procedure was tolerated well Level of Consciousness Awake and Alert (Post-procedure): Post Debridement Measurements of Total Wound Length: (cm) 2.5 Width: (cm) 1.4 Depth: (cm) 0.1 Volume: (cm) 0.275 Character of Wound/Ulcer Post Improved Debridement: Severity of Tissue Post Debridement: Fat layer exposed Post Procedure Diagnosis Same as Pre-procedure Electronic Signature(s) Signed: 10/17/2019 5:52:07 PM By: [name] MD Signed: 10/18/2019 5:42:11 PM By: [name] RN Entered By: [name] on 10/17/2019 09:11:24 -------------------------------------------------------------------------------- Debridement Details Patient Name: Date of Service: Daniel Pia D. 10/17/2019 8:15 AM Medical Record  [id number] Patient Account Number: [id number] Date of Birth/Sex: Treating RN: [birth year] (79 y.o. [name]) Dolores Lory, Morey Hummingbird Primary Care Provider: Hurshel Party Other Clinician: Referring Provider: Treating Provider/Extender:Latham Kinzler, Cheryl Flash, [place] Weeks in Treatment: 33 Debridement Performed for Wound #24 Left,Distal,Anterior Lower Leg Assessment: Performed By: Physician Ricard Dillon., MD Debridement Type: Debridement Severity of Tissue Pre Fat layer exposed Debridement: Level of Consciousness (Pre- Awake and Alert procedure): Pre-procedure Yes - 09:07 Verification/Time Out Taken: Start Time: 09:07 Pain Control: Lidocaine 5% topical ointment Total Area Debrided (L x W): 5.5 (cm) x 2 (cm) = 11 (cm) Tissue and other material Viable, Non-Viable, Slough, Subcutaneous, Slough debrided: Level: Skin/Subcutaneous Tissue Debridement Description: Excisional Instrument: Curette Bleeding: Moderate Hemostasis Achieved: Pressure End Time: 09:14 Procedural Pain: 3 Post Procedural Pain: 0 Response to Treatment: Procedure was tolerated well Level of Consciousness Awake and Alert (Post-procedure): Post Debridement Measurements of Total Wound Length: (cm) 5.5 Width: (cm) 2 Depth: (cm) 0.2 Volume: (cm) 1.728 Character of Wound/Ulcer Post Improved Debridement: Severity of Tissue Post Debridement: Fat layer exposed Post Procedure Diagnosis Same as Pre-procedure Electronic Signature(s) Signed: 10/17/2019 5:52:07 PM By: [name] MD Signed: 10/18/2019 5:42:11 PM By: [name] RN Entered By: [name] on 10/17/2019 09:12:30 -------------------------------------------------------------------------------- HPI Details Patient Name: Date of Service: Daniel Pia D. 10/17/2019 8:15 AM Medical Record [id number] Patient Account Number: [id number] Date of Birth/Sex: Treating RN: Jul 19, 1941 (79 y.o. M) Primary Care Provider: Hurshel Party Other  Clinician: Referring Provider: Treating Provider/Extender:Murat Rideout, Cheryl Flash, [place] Weeks in Treatment: 9 History of Present Illness Location: Patient presents with a wound to left lower leg. Quality: Patient reports No Pain. Duration: 2 months HPI Description: no cig or alcohol. spontaneous appearance in area of stasis dermamtitis. [name] on metformin only. chronic afib on Coumadin. diabetes and coag studies not good. hba1c 7.5. ivr 4.5. no pain or sxs of systemic disease. hx chf. no intermittent claudication 02/28/2019 Readmission This is a now a 79 year old man who was previously cared for in 2016 by Dr. Lindon Romp for wounds on his lower extremities. At that point  he had venous reflux studies although I cannot seem to open these in [place] [facility]. He had arterial studies showing an ABI of 1.11 on the right and 1.27 on the left his waveforms were triphasic bilaterally. He was discharged in stockings although I do not believe he is wearing these in some time. He tells me that about a month ago he noted openings of a large wound on the posterior right calf and 2 smaller areas on the left lateral calf and a small area more recently on the left posterior calf. He has been dressing these with peroxide and triple antibiotic ointment. He is not wearing compression. Past medical history; type 2 diabetes with peripheral neuropathy, chronic venous insufficiency, hypertension, cardiomyopathy, chronic atrial fibrillation on Coumadin, prostate cancer, hyperlipidemia, gout, ABI in our clinic was 1.34 on the left and not obtainable on the right 6/9; this is a patient who has chronic venous insufficiency. He has a fairly substantial area on the right posterior calf, left lateral calf and a small area on the left posterior calf. On arrival last week he had very palpable popliteal and femoral pulses but nothing in his bilateral feet. Unfortunately we cannot get arterial studies until July 1 at Dr.  Kennon Holter office. They live in [place]. We use silver alginate under Kerlix Coban 6/16; patient with chronic venous insufficiency with wounds on his bilateral lower extremities. When he came into our clinic he was discovered to have a complete absence of peripheral pedal pulses at either the dorsalis pedis or posterior tibial. He does have easily palpable femoral and popliteal pulses. He sees Dr. [name] tomorrow for noninvasive arterial tests. He may also require venous reflux evaluation although I do not view this as an urgent thing. We have been using silver alginate. His wound surfaces of cleaned up quite nicely 6/23; patient with chronic venous insufficiency with wounds on his bilateral lower extremities. His wounds all are somewhat better looking. He did go to Dr. Kennon Holter office but somehow ended up on the doctors schedule rather than being scheduled for noninvasive tests therefore his noninvasive tests are scheduled for July 1. We agree that he has venous insufficiency ulcers but I cannot feel any pulses in his lower extremities dictating the need for test. We are only using Kerlix and light Coban unfortunately this appears to be holding the edema 6/30; has his arterial studies tomorrow. We have been using Kerlix and light Coban will go to a more aggressive compression if the arterial studies will allow. We all agreed these are venous wounds however I cannot feel pulses at either the dorsalis pedis or posterior tibial bilaterally. His wounds generally look some better including left lateral and right posterior. 7/7-Patient returns at 1 week in Kerlix/Coban to both legs, with improvement, in the left lateral and right posterior lower leg wounds, ABI's are normal in both legs per vascular studies, TBI is also normal on both sides, we are using hydrofera blue to the wounds 7/14; patient's arterial studies from 2 weeks ago showed an ABI on the right at 1.03 with a TBI of 0.86. On the left the  ABI was 1.06 with a TBI of 0.84. Notable for the fact that his arterial waveforms were monophasic in all of the lower extremity arteries suggesting some degree of arterial occlusive disease but in general this was felt to be fairly adequate for healing. His compression was increased from 2-3 layer which is appropriate. Dressing was changed to [facility] 7/21; patient's wounds are measuring  smaller. The more substantial one on the right posterior calf, second 1 on the left lateral calf. Using [facility] on both wound areas 7/28; patient continues to make nice improvements. The area on the right posterior calf is smaller. Area on the left lateral calf also is smaller. We have been using Hydrofera Blue under compression. The patient will need compression stockings and we have measured him for these in the eventuality that these heal which really should not be too long from now 8/4-Patient continues to make improvement, the right posterior calf area smaller with rim of keratotic skin on one side, the left wound is definitely smaller and improving. 8/11-Returns at 1 week, after being in 3 layer compression on both legs, both wounds appear to be improving, making good progress, patient is happy, pain is also less especially in the right leg wound 8/18; the area on the left anterior lower leg is healed. On the right posterior leg the wound remains although the dimensions are a lot better. 8/25; he arrives in clinic today with a large body of open wound on the left lateral calf. All of the 3 wounds in this area are in close juxtaposition to each other. The story is that we discharged him last week with no a wrap on the left leg. They went to [place] could not get in as they are only excepting phone orders or online orders for stockings hence they did not put any stocking on the left leg all week. They have something at home but the patient with that was either incapable or just did not put them  on. Apparently these opened 1 morning after getting out of bed. The area on the right has no real change 9/1; patient has bilateral lower extremity wounds in the setting of severe chronic venous insufficiency and secondary lymphedema. He arrived last week with new areas on the left lateral lower leg after we did not wrap him and he did not use his stockings. Nevertheless the areas on the left look better today under compression. Posterior right calf does not really changed. We are using Hydrofera Blue on both areas under compression 9/15; bilateral lower extremity wounds in the setting of severe chronic venous insufficiency and secondary lymphedema. He has 20 to 30 mmHg below-knee compression stockings under the eventuality that these close over. We did get the left leg to close but he did not transition to a stocking and this reopened. There are 2 open areas on the left posterior lateral calf and one on the right. Both of these look satisfactory. Using [facility] 9/22; bilateral lower extremity wounds in the setting of chronic venous insufficiency. 2 superficial areas on the left lateral calf. One on the right just above the Achilles area. We have good edema control we have been using Hydrofera Blue 9/29; the areas on the left lateral calf are healed. On the right just above the Achilles and tendon area things look a lot better small wounds one scabbed area. We have been using Hydrofera Blue. We can discharge him in his own stocking on the left still wrapping on the right. This is the second time we have healed the left leg but he did not put a stocking on last time. Hopefully this will maintain the edema from chronic venous disease with secondary lymphedema 10/6; he comes in today having a stocking on the left leg. They had trouble getting it on there is a lot of increase in swelling 2 small open areas one  anteriorly and one on the medial calf. They report a lot of difficulty getting  the stocking on. Paradoxically the area on the right that we have been wrapping posteriorly is closed 10/13; he comes in today with wounds bilaterally including superficial areas on the left medial and left lateral calf. As well as the right posterior has reopened in the Achilles area superiorly. He still does not have his juxta lite stockings although truthfully we would not of been able to use them today anyway. Apparently have been ordered and paid for from [employer] although they have not been delivered 10/20; his area on the right is just the boat closed on the right posterior. Still has the area on the left lateral and a very tiny area on the left medial. He has his bilateral juxta lites although he is not ready for them this week. He tolerated the increase to 4 layer compression last week quite well 10/27; the area on the right posterior calf is once again closed. He has a superficial area on the left lateral calf that is still open. He has been using Hydrofera Blue and bilateral 4-layer compression. He can change to his own juxta lite stocking on the right and we are instructing him today 11/3; the area on the right posterior calf reopened according to the patient and his wife after they took off the stocking when they got home last week. Apparently scabbed over there is now a fairly substantial wound which looks pretty much the same. Our intake nurse noted that they were using the juxta lite stockings appropriately. I was really hoping I might be able to close him out today. He has 1 very tiny remaining area on the left lateral lower leg. 11/10; right posterior calf wound measures smaller but is still open. We have been using Hydrofera Blue. On the left he has a small oval-shaped wound and he seems to have had another wound distally that is open and likely a blister. We are using Hydrofera Blue under compression 11/17; right posterior calf wound continues to get better. We have been using  Hydrofera Blue. On the left lateral one of the wounds has closed still a small open area. We have been using Hydrofera Blue on this as well. Both areas have been under 4-layer compression Arrives in clinic today with some swelling in the dorsal foot on the right some erythema of his forefoot and toes. Initially when I looked at this I almost thought this was a sunburn distal to a wrap injury. 12/1; right posterior calf wound debrided with a curette. We have been using Hydrofera Blue on the left anterior lateral he has an area across the mid tibia. Finally a small area on the left lateral lower calf. Finally he continues to have de-epithelialized areas on the dorsal aspect of his toes. Initially thought this might be a burn injury when I saw him 2 weeks ago. I now wonder about tinea. I have also reviewed his arterial studies which were really quite good in July/20 with normal TBI's and ABIs but monophasic waveforms 12/8; comes in today with worsening problems especially on the left leg where he now has a cluster of wounds in the left anterior mid tibia. Very poor edema control. I reduced him to 3 layer from 4 layer compression last week because of some concern about blood flow to his toes however he does not have good edema control on the left leg. Right leg edema control looks satisfactory. On the left  he has a cluster of wounds anteriorly, small area on the left medial fifth met head and then the collection of areas on his toes which appear better On the right he has the original area on the right posterior calf, a new area right medially. His formal arterial studies from mid July are noted below. He was evaluated by Dr.Berry ABI Findings: +---------+------------------+-----+----------+--------+ Right Rt Pressure (mmHg)IndexWaveform Comment  +---------+------------------+-----+----------+--------+ Brachial 176     +---------+------------------+-----+----------+--------+ ATA 176  0.99 monophasic  +---------+------------------+-----+----------+--------+ PTA 183 1.03 monophasic  +---------+------------------+-----+----------+--------+ PERO 172 0.97 monophasic  +---------+------------------+-----+----------+--------+ Great Toe153 0.86    +---------+------------------+-----+----------+--------+ +---------+------------------+-----+-----------+-------+ Left Lt Pressure (mmHg)IndexWaveform Comment +---------+------------------+-----+-----------+-------+ Brachial 178     +---------+------------------+-----+-----------+-------+ ATA 162 0.91 multiphasic  +---------+------------------+-----+-----------+-------+ PTA 188 1.06 multiphasic  +---------+------------------+-----+-----------+-------+ PERO 158 0.89 monophasic   +---------+------------------+-----+-----------+-------+ Great Toe150 0.84    +---------+------------------+-----+-----------+-------+ +-------+-----------+-----------+------------+------------+ ABI/TBIToday's ABIToday's TBIPrevious ABIPrevious TBI +-------+-----------+-----------+------------+------------+ Right 1.03 0.86 1.11   +-------+-----------+-----------+------------+------------+ Left 1.06 0.84 1.27   +-------+-----------+-----------+------------+------------+ Tibial waveforms somewhat difficult to record due to irregular heartbeat. Bilateral ABIs appear essentially unchanged compared to prior study on 06/21/15. Summary: Right: Resting right ankle-brachial index is within normal range. No evidence of significant right lower extremity arterial disease. The right toe-brachial index is normal. Although ankle brachial indices are within normal limits (0.95-1.29), arterial Doppler waveforms at the ankle suggest some component of arterial occlusive disease. Left: Resting left ankle-brachial index is within normal range. No evidence of significant left lower extremity  arterial disease. The left toe-brachial index is normal. Although ankle brachial indices are within normal limits (0.95-1.29), arterial Doppler waveforms at the ankle suggest some component of arterial occlusive disease. 12/15; the patient's area on the left mid tibia looks better. Right posterior calf also better. He has the area on the left foot as well. All of his toes look better I think this was tinea. We are using Hydrofera Blue everywhere else The patient was in urgent care yesterday with wheezing and shortness of breath. He got azithromycin and prednisone. He feels better. His lungs are currently clear to auscultation. He was not tested for Covid 19 12/29; the patient arrives in clinic today with quite a bit change. 2 weeks ago he only had areas on the left mid tibia right posterior calf with tinea pedis resolving between his toes. He arrives in clinic today with several areas on the dorsal toes on the right, dorsal left second toe. He has skin breakdown in the left medial calf probably from excess edema. Small area proximally in the medial calf. He has weeping edema fluid coming out of the skin excoriations on the left medial calf. He tells me that he is having a cardiac catheterization next week. I had a quick look at [facility] health link. He was found to have an ejection fraction of 25% during the work-up for persistent atrial fibrillation. He saw his cardiology office yesterday seen by the nurse practitioner. She increased his carvedilol. He has not been on diuretics for apparently several months and indeed in the nurse practitioner Dietrich Pates notes she had knowledge of this. 10/04/2019 on evaluation today patient presents as a walk-in visit concerning the fact that he did not have an appointment here for our clinic at this point. He actually had a cardiac catheterization earlier today and then came from there to here in order to be evaluated. With that being said unfortunately he is  having significant cellulitis of his left lower extremity upon evaluation today this appears to have deteriorated even since last week's  evaluation with Dr. Dellia Nims. The right lower extremity is actually doing okay I really see no evidence of deterioration at this point at those locations. In fact the right leg seems in general be doing quite well. Nonetheless I am concerned about infection and cellulitis of the left lower extremity and again considering his weakened heart I do not want him to develop into sepsis at all. He is also having some trouble breathing today and I understand according to nursing staff this is always the case to some degree. With that being said the patient unfortunately seems to be in my opinion a little bit worse even his wife feels like that may be the case today. Unsure exactly what is leading to this. Cardiac catheterization I did review the report which showed an ejection fraction of 25% he also had an LAD blockage of around 25% based on what I saw. With that being said there was no significant blockages that required stenting at this point he does have weakened cardiac muscles compared to normal. 10/05/2019; patient was seen yesterday in clinic. He was sent to the ER because of cellulitis of the left leg possibility. In the ER he was given 1 dose of IV Levaquin and discharged on Keflex. He came in the clinic initially for a nurse visit to rewrap his left leg. We did not look at the right leg today that is an Haematologist. The patient also had a cardiac cath. According to him there were no blockages but a very low ejection fraction. 1/12; back for an early follow-up. The condition of the left lower leg is a lot better although there are multiple open areas. All of them with not a particularly viable surface. On the right posterior calf he has a single wound with a clean surface. He has a wound with exposed bone on the PIP of the left second toe dorsally. He has wounds on  the dorsal right first second and third toes. His arterial studies were normal. 1/19; the patient has 3 open wounds on the left leg anteriorly in the mid tibia, distally and medially just above the ankle and posteriorly. On the right he has a small area dime sized on the right posterior calf. His edema control is a lot better. He still has wounds on the right second and left second toes. The left second toe has exposed bone. X- ray I did last week did not show evidence of osteomyelitis in the left foot. Electronic Signature(s) Signed: 10/17/2019 5:52:07 PM By: [name] MD Entered By: [name] on 10/17/2019 09:19:57 -------------------------------------------------------------------------------- Physical Exam Details Patient Name: Date of Service: [name], [name] 10/17/2019 8:15 AM Medical Record [id number] Patient Account Number: [id number] Date of Birth/Sex: Treating RN: 10-20-1940 (79 y.o. M) Primary Care Provider: Hurshel Party Other Clinician: Referring Provider: Treating Provider/Extender:Carlena Ruybal, Cheryl Flash, [place] Weeks in Treatment: 100 Constitutional Sitting or standing Blood Pressure is within target range for patient.. Pulse regular and within target range for patient.Marland Kitchen Respirations regular, non-labored and within target range.. Temperature is normal and within the target range for the patient.Marland Kitchen Appears in no distress. Notes Wound exam Patient has a clean wound on the posterior calf. The 2 wounds mid tibia and medially just above the ankle both required debridement although they clean up quite nicely. The left second toe has a wide swath of exposed middle phalanx and PIP joint. On the right side he only has a small dime sized clean wound on the posterior calf. Still has open wounds  on the right second toe. Electronic Signature(s) Signed: 10/17/2019 5:52:07 PM By: [name] MD Entered By: [name] on 10/17/2019  09:21:15 -------------------------------------------------------------------------------- Physician Orders Details Patient Name: Date of Service: [name], [name] 10/17/2019 8:15 AM Medical Record [id number] Patient Account Number: [id number] Date of Birth/Sex: Treating RN: 06/27/1941 (78 y.o. [name]) Dolores Lory, [place] Primary Care Provider: Hurshel Party Other Clinician: Referring Provider: Treating Provider/Extender:Kabria Hetzer, Cheryl Flash, [place] Weeks in Treatment: 67 Verbal / Phone Orders: No Diagnosis Coding ICD-10 Coding Code Description E11.51 Type 2 diabetes mellitus with diabetic peripheral angiopathy without gangrene L97.211 Non-pressure chronic ulcer of right calf limited to breakdown of skin E11.42 Type 2 diabetes mellitus with diabetic polyneuropathy L97.221 Non-pressure chronic ulcer of left calf limited to breakdown of skin I87.333 Chronic venous hypertension (idiopathic) with ulcer and inflammation of bilateral lower extremity L97.524 Non-pressure chronic ulcer of other part of left foot with necrosis of bone L97.511 Non-pressure chronic ulcer of other part of right foot limited to breakdown of skin W54.62 Chronic systolic (congestive) heart failure Follow-up Appointments Return Appointment in 1 week. - Tuesday **********EXTRA TIME**************** Dressing Change Frequency Wound #13 Left Toe Second Change dressing every day. Wound #18 Left,Medial Foot Do not change entire dressing for one week. Wound #19 Left,Circumferential Lower Leg Do not change entire dressing for one week. Wound #20 Right Toe Great Change dressing every day. Wound #21 Right Toe Second Change dressing every day. Wound #22 Right Toe Third Change dressing every day. Wound #24 Left,Distal,Anterior Lower Leg Do not change entire dressing for one week. Wound #25 Left,Distal,Posterior Lower Leg Do not change entire dressing for one week. Wound #26 Left,Medial Lower Leg Do not change entire  dressing for one week. Wound #27 Left,Proximal,Lateral Lower Leg Do not change entire dressing for one week. Wound #3R Right,Posterior Calf Do not change entire dressing for one week. Skin Barriers/Peri-Wound Care TCA Cream or Ointment Wound Cleansing May shower with protection. Primary Wound Dressing Wound #13 Left Toe Second Calcium Alginate with Silver Wound #18 Left,Medial Foot Iodoflex Wound #19 Left,Circumferential Lower Leg Iodoflex Wound #20 Right Toe Great Calcium Alginate with Silver Wound #21 Right Toe Second Calcium Alginate with Silver Wound #22 Right Toe Third Calcium Alginate with Silver Wound #24 Left,Distal,Anterior Lower Leg Iodoflex Wound #25 Left,Distal,Posterior Lower Leg Iodoflex Wound #26 Left,Medial Lower Leg Iodoflex Wound #27 Left,Proximal,Lateral Lower Leg Iodoflex Wound #3R Right,Posterior Calf Iodoflex Secondary Dressing Dry Gauze - secure toes with tape Edema Control Unna Boots Bilaterally Off-Loading Open toe surgical shoe to: - left and right foot Electronic Signature(s) Signed: 10/17/2019 5:52:07 PM By: [name] MD Signed: 10/18/2019 5:42:11 PM By: [name] RN Entered By: [name] on 10/17/2019 08:22:27 -------------------------------------------------------------------------------- Problem List Details Patient Name: Date of Service: Daniel Pia D. 10/17/2019 8:15 AM Medical Record [id number] Patient Account Number: [id number] Date of Birth/Sex: Treating RN: Dec 21, 1940 (78 y.o. [name]) Dolores Lory, [place] Primary Care Provider: Hurshel Party Other Clinician: Referring Provider: Treating Provider/Extender:Dawson Albers, Cheryl Flash, [place] Weeks in Treatment: 33 Active Problems ICD-10 Evaluated Encounter Code Description Active Date Today Diagnosis E11.51 Type 2 diabetes mellitus with diabetic peripheral 02/28/2019 No Yes angiopathy without gangrene L97.211 Non-pressure chronic ulcer of right calf limited to 02/28/2019  No Yes breakdown of skin E11.42 Type 2 diabetes mellitus with diabetic polyneuropathy 02/28/2019 No Yes L97.221 Non-pressure chronic ulcer of left calf limited to 02/28/2019 No Yes breakdown of skin I87.333 Chronic venous hypertension (idiopathic) with ulcer 02/28/2019 No Yes and inflammation of bilateral lower extremity L97.524 Non-pressure chronic ulcer of other part of  left foot 10/10/2019 No Yes with necrosis of bone L97.511 Non-pressure chronic ulcer of other part of right foot 09/26/2019 No Yes limited to breakdown of skin G25.42 Chronic systolic (congestive) heart failure 10/05/2019 No Yes Inactive Problems ICD-10 Code Description Active Date Inactive Date B35.3 Tinea pedis 09/05/2019 09/05/2019 L97.521 Non-pressure chronic ulcer of other part of left foot limited to 09/26/2019 09/26/2019 breakdown of skin Resolved Problems Electronic Signature(s) Signed: 10/17/2019 5:52:07 PM By: [name] MD Entered By: [name] on 10/17/2019 09:18:04 -------------------------------------------------------------------------------- Progress Note Details Patient Name: Date of Service: Daniel Pia D. 10/17/2019 8:15 AM Medical Record [id number] Patient Account Number: [id number] Date of Birth/Sex: Treating RN: Feb 15, 1941 (79 y.o. M) Primary Care Provider: Hurshel Party Other Clinician: Referring Provider: Treating Provider/Extender:Sharlena Kristensen, Cheryl Flash, [place] Weeks in Treatment: 46 Subjective History of Present Illness (HPI) The following HPI elements were documented for the patient's wound: Location: Patient presents with a wound to left lower leg. Quality: Patient reports No Pain. Duration: 2 months no cig or alcohol. spontaneous appearance in area of stasis dermamtitis. [name] on metformin only. chronic afib on Coumadin. diabetes and coag studies not good. hba1c 7.5. ivr 4.5. no pain or sxs of systemic disease. hx chf. no intermittent  claudication 02/28/2019 Readmission This is a now a 79 year old man who was previously cared for in 2016 by Dr. Lindon Romp for wounds on his lower extremities. At that point he had venous reflux studies although I cannot seem to open these in [facility] link. He had arterial studies showing an ABI of 1.11 on the right and 1.27 on the left his waveforms were triphasic bilaterally. He was discharged in stockings although I do not believe he is wearing these in some time. He tells me that about a month ago he noted openings of a large wound on the posterior right calf and 2 smaller areas on the left lateral calf and a small area more recently on the left posterior calf. He has been dressing these with peroxide and triple antibiotic ointment. He is not wearing compression. Past medical history; type 2 diabetes with peripheral neuropathy, chronic venous insufficiency, hypertension, cardiomyopathy, chronic atrial fibrillation on Coumadin, prostate cancer, hyperlipidemia, gout, ABI in our clinic was 1.34 on the left and not obtainable on the right 6/9; this is a patient who has chronic venous insufficiency. He has a fairly substantial area on the right posterior calf, left lateral calf and a small area on the left posterior calf. On arrival last week he had very palpable popliteal and femoral pulses but nothing in his bilateral feet. Unfortunately we cannot get arterial studies until July 1 at Dr. Kennon Holter office. They live in [place]. We use silver alginate under Kerlix Coban 6/16; patient with chronic venous insufficiency with wounds on his bilateral lower extremities. When he came into our clinic he was discovered to have a complete absence of peripheral pedal pulses at either the dorsalis pedis or posterior tibial. He does have easily palpable femoral and popliteal pulses. He sees Dr. [name] tomorrow for noninvasive arterial tests. He may also require venous reflux evaluation although I do not view this  as an urgent thing. We have been using silver alginate. His wound surfaces of cleaned up quite nicely 6/23; patient with chronic venous insufficiency with wounds on his bilateral lower extremities. His wounds all are somewhat better looking. He did go to Dr. Kennon Holter office but somehow ended up on the doctors schedule rather than being scheduled for noninvasive tests therefore his noninvasive tests are scheduled  for July 1. We agree that he has venous insufficiency ulcers but I cannot feel any pulses in his lower extremities dictating the need for test. We are only using Kerlix and light Coban unfortunately this appears to be holding the edema 6/30; has his arterial studies tomorrow. We have been using Kerlix and light Coban will go to a more aggressive compression if the arterial studies will allow. We all agreed these are venous wounds however I cannot feel pulses at either the dorsalis pedis or posterior tibial bilaterally. His wounds generally look some better including left lateral and right posterior. 7/7-Patient returns at 1 week in Kerlix/Coban to both legs, with improvement, in the left lateral and right posterior lower leg wounds, ABI's are normal in both legs per vascular studies, TBI is also normal on both sides, we are using hydrofera blue to the wounds 7/14; patient's arterial studies from 2 weeks ago showed an ABI on the right at 1.03 with a TBI of 0.86. On the left the ABI was 1.06 with a TBI of 0.84. Notable for the fact that his arterial waveforms were monophasic in all of the lower extremity arteries suggesting some degree of arterial occlusive disease but in general this was felt to be fairly adequate for healing. His compression was increased from 2-3 layer which is appropriate. Dressing was changed to [facility] 7/21; patient's wounds are measuring smaller. The more substantial one on the right posterior calf, second 1 on the left lateral calf. Using [facility] on  both wound areas 7/28; patient continues to make nice improvements. The area on the right posterior calf is smaller. Area on the left lateral calf also is smaller. We have been using Hydrofera Blue under compression. The patient will need compression stockings and we have measured him for these in the eventuality that these heal which really should not be too long from now 8/4-Patient continues to make improvement, the right posterior calf area smaller with rim of keratotic skin on one side, the left wound is definitely smaller and improving. 8/11-Returns at 1 week, after being in 3 layer compression on both legs, both wounds appear to be improving, making good progress, patient is happy, pain is also less especially in the right leg wound 8/18; the area on the left anterior lower leg is healed. On the right posterior leg the wound remains although the dimensions are a lot better. 8/25; he arrives in clinic today with a large body of open wound on the left lateral calf. All of the 3 wounds in this area are in close juxtaposition to each other. The story is that we discharged him last week with no a wrap on the left leg. They went to [facility] could not get in as they are only excepting phone orders or online orders for stockings hence they did not put any stocking on the left leg all week. They have something at home but the patient with that was either incapable or just did not put them on. Apparently these opened 1 morning after getting out of bed. The area on the right has no real change 9/1; patient has bilateral lower extremity wounds in the setting of severe chronic venous insufficiency and secondary lymphedema. He arrived last week with new areas on the left lateral lower leg after we did not wrap him and he did not use his stockings. Nevertheless the areas on the left look better today under compression. Posterior right calf does not really changed. We are using Hydrofera  Blue on both  areas under compression 9/15; bilateral lower extremity wounds in the setting of severe chronic venous insufficiency and secondary lymphedema. He has 20 to 30 mmHg below-knee compression stockings under the eventuality that these close over. We did get the left leg to close but he did not transition to a stocking and this reopened. There are 2 open areas on the left posterior lateral calf and one on the right. Both of these look satisfactory. Using [facility] 9/22; bilateral lower extremity wounds in the setting of chronic venous insufficiency. 2 superficial areas on the left lateral calf. One on the right just above the Achilles area. We have good edema control we have been using Hydrofera Blue 9/29; the areas on the left lateral calf are healed. On the right just above the Achilles and tendon area things look a lot better small wounds one scabbed area. We have been using Hydrofera Blue. We can discharge him in his own stocking on the left still wrapping on the right. This is the second time we have healed the left leg but he did not put a stocking on last time. Hopefully this will maintain the edema from chronic venous disease with secondary lymphedema 10/6; he comes in today having a stocking on the left leg. They had trouble getting it on there is a lot of increase in swelling 2 small open areas one anteriorly and one on the medial calf. They report a lot of difficulty getting the stocking on. ooParadoxically the area on the right that we have been wrapping posteriorly is closed 10/13; he comes in today with wounds bilaterally including superficial areas on the left medial and left lateral calf. As well as the right posterior has reopened in the Achilles area superiorly. He still does not have his juxta lite stockings although truthfully we would not of been able to use them today anyway. Apparently have been ordered and paid for from [employer] although they have not been delivered 10/20;  his area on the right is just the boat closed on the right posterior. Still has the area on the left lateral and a very tiny area on the left medial. He has his bilateral juxta lites although he is not ready for them this week. He tolerated the increase to 4 layer compression last week quite well 10/27; the area on the right posterior calf is once again closed. He has a superficial area on the left lateral calf that is still open. He has been using Hydrofera Blue and bilateral 4-layer compression. He can change to his own juxta lite stocking on the right and we are instructing him today 11/3; the area on the right posterior calf reopened according to the patient and his wife after they took off the stocking when they got home last week. Apparently scabbed over there is now a fairly substantial wound which looks pretty much the same. Our intake nurse noted that they were using the juxta lite stockings appropriately. I was really hoping I might be able to close him out today. He has 1 very tiny remaining area on the left lateral lower leg. 11/10; right posterior calf wound measures smaller but is still open. We have been using Hydrofera Blue. On the left he has a small oval-shaped wound and he seems to have had another wound distally that is open and likely a blister. We are using Hydrofera Blue under compression 11/17; right posterior calf wound continues to get better. We have been using Hydrofera Blue.  On the left lateral one of the wounds has closed still a small open area. We have been using Hydrofera Blue on this as well. Both areas have been under 4-layer compression Arrives in clinic today with some swelling in the dorsal foot on the right some erythema of his forefoot and toes. Initially when I looked at this I almost thought this was a sunburn distal to a wrap injury. 12/1; right posterior calf wound debrided with a curette. We have been using Hydrofera Blue on the left anterior lateral  he has an area across the mid tibia. Finally a small area on the left lateral lower calf. Finally he continues to have de-epithelialized areas on the dorsal aspect of his toes. Initially thought this might be a burn injury when I saw him 2 weeks ago. I now wonder about tinea. I have also reviewed his arterial studies which were really quite good in July/20 with normal TBI's and ABIs but monophasic waveforms 12/8; comes in today with worsening problems especially on the left leg where he now has a cluster of wounds in the left anterior mid tibia. Very poor edema control. I reduced him to 3 layer from 4 layer compression last week because of some concern about blood flow to his toes however he does not have good edema control on the left leg. Right leg edema control looks satisfactory. ooOn the left he has a cluster of wounds anteriorly, small area on the left medial fifth met head and then the collection of areas on his toes which appear better ooOn the right he has the original area on the right posterior calf, a new area right medially. His formal arterial studies from mid July are noted below. He was evaluated by Dr.Berry ABI Findings: +---------+------------------+-----+----------+--------+ Right Rt Pressure (mmHg)IndexWaveform Comment  +---------+------------------+-----+----------+--------+ Brachial 176     +---------+------------------+-----+----------+--------+ ATA 176 0.99 monophasic  +---------+------------------+-----+----------+--------+ PTA 183 1.03 monophasic  +---------+------------------+-----+----------+--------+ PERO 172 0.97 monophasic  +---------+------------------+-----+----------+--------+ Great Toe153 0.86    +---------+------------------+-----+----------+--------+ +---------+------------------+-----+-----------+-------+ Left Lt Pressure (mmHg)IndexWaveform  Comment +---------+------------------+-----+-----------+-------+ Brachial 178     +---------+------------------+-----+-----------+-------+ ATA 162 0.91 multiphasic  +---------+------------------+-----+-----------+-------+ PTA 188 1.06 multiphasic  +---------+------------------+-----+-----------+-------+ PERO 158 0.89 monophasic   +---------+------------------+-----+-----------+-------+ Great Toe150 0.84    +---------+------------------+-----+-----------+-------+ +-------+-----------+-----------+------------+------------+ ABI/TBIToday's ABIToday's TBIPrevious ABIPrevious TBI +-------+-----------+-----------+------------+------------+ Right 1.03 0.86 1.11   +-------+-----------+-----------+------------+------------+ Left 1.06 0.84 1.27   +-------+-----------+-----------+------------+------------+ Tibial waveforms somewhat difficult to record due to irregular heartbeat. Bilateral ABIs appear essentially unchanged compared to prior study on 06/21/15. Summary: Right: Resting right ankle-brachial index is within normal range. No evidence of significant right lower extremity arterial disease. The right toe-brachial index is normal. Although ankle brachial indices are within normal limits (0.95-1.29), arterial Doppler waveforms at the ankle suggest some component of arterial occlusive disease. Left: Resting left ankle-brachial index is within normal range. No evidence of significant left lower extremity arterial disease. The left toe-brachial index is normal. Although ankle brachial indices are within normal limits (0.95-1.29), arterial Doppler waveforms at the ankle suggest some component of arterial occlusive disease. 12/15; the patient's area on the left mid tibia looks better. Right posterior calf also better. He has the area on the left foot as well. All of his toes look better I think this was tinea. We are using Hydrofera Blue everywhere  else The patient was in urgent care yesterday with wheezing and shortness of breath. He got azithromycin and prednisone. He feels better. His lungs are currently clear to auscultation. He was not tested for Covid 19 12/29; the  patient arrives in clinic today with quite a bit change. 2 weeks ago he only had areas on the left mid tibia right posterior calf with tinea pedis resolving between his toes. He arrives in clinic today with several areas on the dorsal toes on the right, dorsal left second toe. He has skin breakdown in the left medial calf probably from excess edema. Small area proximally in the medial calf. He has weeping edema fluid coming out of the skin excoriations on the left medial calf. He tells me that he is having a cardiac catheterization next week. I had a quick look at [facility] health link. He was found to have an ejection fraction of 25% during the work-up for persistent atrial fibrillation. He saw his cardiology office yesterday seen by the nurse practitioner. She increased his carvedilol. He has not been on diuretics for apparently several months and indeed in the nurse practitioner Dietrich Pates notes she had knowledge of this. 10/04/2019 on evaluation today patient presents as a walk-in visit concerning the fact that he did not have an appointment here for our clinic at this point. He actually had a cardiac catheterization earlier today and then came from there to here in order to be evaluated. With that being said unfortunately he is having significant cellulitis of his left lower extremity upon evaluation today this appears to have deteriorated even since last week's evaluation with Dr. Dellia Nims. The right lower extremity is actually doing okay I really see no evidence of deterioration at this point at those locations. In fact the right leg seems in general be doing quite well. Nonetheless I am concerned about infection and cellulitis of the left lower extremity and again  considering his weakened heart I do not want him to develop into sepsis at all. He is also having some trouble breathing today and I understand according to nursing staff this is always the case to some degree. With that being said the patient unfortunately seems to be in my opinion a little bit worse even his wife feels like that may be the case today. Unsure exactly what is leading to this. Cardiac catheterization I did review the report which showed an ejection fraction of 25% he also had an LAD blockage of around 25% based on what I saw. With that being said there was no significant blockages that required stenting at this point he does have weakened cardiac muscles compared to normal. 10/05/2019; patient was seen yesterday in clinic. He was sent to the ER because of cellulitis of the left leg possibility. In the ER he was given 1 dose of IV Levaquin and discharged on Keflex. He came in the clinic initially for a nurse visit to rewrap his left leg. We did not look at the right leg today that is an Haematologist. The patient also had a cardiac cath. According to him there were no blockages but a very low ejection fraction. 1/12; back for an early follow-up. The condition of the left lower leg is a lot better although there are multiple open areas. All of them with not a particularly viable surface. On the right posterior calf he has a single wound with a clean surface. He has a wound with exposed bone on the PIP of the left second toe dorsally. He has wounds on the dorsal right first second and third toes. His arterial studies were normal. 1/19; the patient has 3 open wounds on the left leg anteriorly in the mid tibia, distally and medially just  above the ankle and posteriorly. On the right he has a small area dime sized on the right posterior calf. His edema control is a lot better. He still has wounds on the right second and left second toes. The left second toe has exposed bone. X- ray I did last  week did not show evidence of osteomyelitis in the left foot. Objective Constitutional Sitting or standing Blood Pressure is within target range for patient.. Pulse regular and within target range for patient.Marland Kitchen Respirations regular, non-labored and within target range.. Temperature is normal and within the target range for the patient.Marland Kitchen Appears in no distress. Vitals Time Taken: 8:20 AM, Height: 74 in, Weight: 212 lbs, BMI: 27.2, Temperature: 98 F, Pulse: 63 bpm, Respiratory Rate: 20 breaths/min, Blood Pressure: 110/70 mmHg. General Notes: Wound exam ooPatient has a clean wound on the posterior calf. The 2 wounds mid tibia and medially just above the ankle both required debridement although they clean up quite nicely. The left second toe has a wide swath of exposed middle phalanx and PIP joint. ooOn the right side he only has a small dime sized clean wound on the posterior calf. Still has open wounds on the right second toe. Integumentary (Hair, Skin) Wound #13 status is Open. Original cause of wound was Gradually Appeared. The wound is located on the Left Toe Second. The wound measures 1.9cm length x 1.7cm width x 0.2cm depth; 2.537cm^2 area and 0.507cm^3 volume. There is bone, joint, and Fat Layer (Subcutaneous Tissue) Exposed exposed. There is no tunneling or undermining noted. There is a medium amount of serosanguineous drainage noted. The wound margin is distinct with the outline attached to the wound base. There is no granulation within the wound bed. There is a large (67-100%) amount of necrotic tissue within the wound bed including Adherent Slough. Wound #18 status is Open. Original cause of wound was Gradually Appeared. The wound is located on the Left,Medial Foot. The wound measures 0.1cm length x 0.1cm width x 0.1cm depth; 0.008cm^2 area and 0.001cm^3 volume. There is Fat Layer (Subcutaneous Tissue) Exposed exposed. There is no tunneling or undermining noted. There is a small  amount of serosanguineous drainage noted. The wound margin is flat and intact. There is large (67- 100%) red, pink granulation within the wound bed. There is a small (1-33%) amount of necrotic tissue within the wound bed including Adherent Slough. Wound #19 status is Open. Original cause of wound was Gradually Appeared. The wound is located on the Left,Anterior Lower Leg. The wound measures 2.5cm length x 1.4cm width x 0.1cm depth; 2.749cm^2 area and 0.275cm^3 volume. There is Fat Layer (Subcutaneous Tissue) Exposed exposed. There is no tunneling or undermining noted. There is a medium amount of serosanguineous drainage noted. The wound margin is flat and intact. There is medium (34-66%) red, pink granulation within the wound bed. There is a medium (34-66%) amount of necrotic tissue within the wound bed including Adherent Slough. Wound #20 status is Open. Original cause of wound was Gradually Appeared. The wound is located on the Right Toe Great. The wound measures 1.3cm length x 0.4cm width x 0.1cm depth; 0.408cm^2 area and 0.041cm^3 volume. There is Fat Layer (Subcutaneous Tissue) Exposed exposed. There is no tunneling or undermining noted. There is a small amount of serosanguineous drainage noted. The wound margin is distinct with the outline attached to the wound base. There is medium (34-66%) pink granulation within the wound bed. There is a medium (34-66%) amount of necrotic tissue within the wound bed including  Adherent Slough. Wound #21 status is Open. Original cause of wound was Gradually Appeared. The wound is located on the Right Toe Second. The wound measures 1.3cm length x 1.1cm width x 0.1cm depth; 1.123cm^2 area and 0.112cm^3 volume. There is Fat Layer (Subcutaneous Tissue) Exposed exposed. There is no tunneling or undermining noted. There is a small amount of serosanguineous drainage noted. The wound margin is flat and intact. There is medium (34-66%) pink granulation within the  wound bed. There is a medium (34-66%) amount of necrotic tissue within the wound bed including Adherent Slough. Wound #22 status is Open. Original cause of wound was Gradually Appeared. The wound is located on the Right Toe Third. The wound measures 1cm length x 0.4cm width x 0.2cm depth; 0.314cm^2 area and 0.063cm^3 volume. There is Fat Layer (Subcutaneous Tissue) Exposed exposed. There is no tunneling or undermining noted. There is a small amount of serosanguineous drainage noted. The wound margin is flat and intact. There is medium (34-66%) red, pink granulation within the wound bed. There is a medium (34-66%) amount of necrotic tissue within the wound bed including Adherent Slough. Wound #24 status is Open. Original cause of wound was Gradually Appeared. The wound is located on the [facility] Lower Leg. The wound measures 5.5cm length x 2cm width x 0.2cm depth; 8.639cm^2 area and 1.728cm^3 volume. There is Fat Layer (Subcutaneous Tissue) Exposed exposed. There is no tunneling or undermining noted. There is a medium amount of serosanguineous drainage noted. The wound margin is flat and intact. There is medium (34-66%) red granulation within the wound bed. There is a medium (34-66%) amount of necrotic tissue within the wound bed including Adherent Slough. Wound #25 status is Open. Original cause of wound was Gradually Appeared. The wound is located on the Left,Distal,Posterior Lower Leg. The wound measures 6.5cm length x 3cm width x 0.1cm depth; 15.315cm^2 area and 1.532cm^3 volume. There is Fat Layer (Subcutaneous Tissue) Exposed exposed. There is no tunneling or undermining noted. There is a medium amount of serosanguineous drainage noted. The wound margin is flat and intact. There is medium (34-66%) red, pink granulation within the wound bed. There is a medium (34-66%) amount of necrotic tissue within the wound bed including Adherent Slough. Wound #26 status is Open. Original cause  of wound was Gradually Appeared. The wound is located on the Left,Medial Lower Leg. The wound measures 1.7cm length x 0.8cm width x 0.1cm depth; 1.068cm^2 area and 0.107cm^3 volume. There is Fat Layer (Subcutaneous Tissue) Exposed exposed. There is no tunneling or undermining noted. There is a small amount of serosanguineous drainage noted. The wound margin is flat and intact. There is large (67-100%) red granulation within the wound bed. There is a small (1-33%) amount of necrotic tissue within the wound bed including Adherent Slough. Wound #27 status is Open. Original cause of wound was Gradually Appeared. The wound is located on the Left,Proximal,Lateral Lower Leg. The wound measures 0.1cm length x 0.1cm width x 0.1cm depth; 0.008cm^2 area and 0.001cm^3 volume. There is Fat Layer (Subcutaneous Tissue) Exposed exposed. There is no tunneling or undermining noted. There is a none present amount of drainage noted. The wound margin is flat and intact. There is no granulation within the wound bed. There is no necrotic tissue within the wound bed. General Notes: fibrin/scabbed over area. Wound #3R status is Open. Original cause of wound was Gradually Appeared. The wound is located on the Right,Posterior Calf. The wound measures 1.8cm length x 1.2cm width x 0.1cm depth; 1.696cm^2 area and  0.17cm^3 volume. There is Fat Layer (Subcutaneous Tissue) Exposed exposed. There is no tunneling or undermining noted. There is a medium amount of serosanguineous drainage noted. The wound margin is distinct with the outline attached to the wound base. There is large (67-100%) red granulation within the wound bed. There is no necrotic tissue within the wound bed. 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 There was a Excisional Skin/Subcutaneous Tissue Debridement with a total area of 3.5 sq cm performed by Ricard Dillon., MD. With the following instrument(s): Curette to remove Viable and Non-Viable tissue/material. Material removed includes Subcutaneous Tissue and Slough and after achieving pain control using Lidocaine 5% topical ointment. No specimens were taken. A time out was conducted at 09:07, prior to the start of the procedure. A Moderate amount of bleeding was controlled with Pressure. The procedure was tolerated well with a pain level of 3 throughout and a pain level of 0 following the procedure. Post Debridement Measurements: 2.5cm length x 1.4cm width x 0.1cm depth; 0.275cm^3 volume. Character of Wound/Ulcer Post Debridement is improved. Severity of Tissue Post Debridement is: Fat layer exposed. Post procedure Diagnosis Wound #19: Same as Pre-Procedure Pre-procedure diagnosis of Wound #19 is a Venous Leg Ulcer located on the Left,Anterior Lower Leg . There was a Haematologist Compression Therapy Procedure by [name], RN. Post procedure Diagnosis Wound #19: Same as Pre-Procedure Wound #24 Pre-procedure diagnosis of Wound #24 is a Venous Leg Ulcer located on the Left,Distal,Anterior Lower Leg .Severity of Tissue Pre Debridement is: Fat layer exposed. There was a Excisional Skin/Subcutaneous Tissue Debridement with a total area of 11 sq cm performed by  Ricard Dillon., MD. With the following instrument(s): Curette to remove Viable and Non-Viable tissue/material. Material removed includes Subcutaneous Tissue and Slough and after achieving pain control using Lidocaine 5% topical ointment. No specimens were taken. A time out was conducted at 09:07, prior to the start of the procedure. A Moderate amount of bleeding was controlled with Pressure. The procedure was tolerated well with a pain level of 3 throughout and a pain level of 0 following the procedure. Post Debridement Measurements: 5.5cm length x 2cm width x 0.2cm depth; 1.728cm^3 volume. Character of Wound/Ulcer Post Debridement is improved. Severity of Tissue Post Debridement is: Fat layer exposed. Post procedure Diagnosis Wound #24: Same as Pre-Procedure Pre-procedure diagnosis of Wound #24 is a Venous Leg Ulcer located on the Left,Distal,Anterior Lower Leg . There was a Haematologist Compression Therapy Procedure by [name], RN. Post procedure Diagnosis Wound #24: Same as Pre-Procedure Wound #18 Pre-procedure diagnosis of Wound #18 is a Diabetic Wound/Ulcer of the Lower Extremity located on the Left,Medial Foot . There was a Haematologist Compression Therapy Procedure by [name], RN. Post procedure Diagnosis Wound #18: Same as Pre-Procedure Wound #25 Pre-procedure diagnosis of Wound #25 is a Venous Leg Ulcer located on the  Left,Distal,Posterior Lower Leg . There was a Haematologist Compression Therapy Procedure by [name], RN. Post procedure Diagnosis Wound #25: Same as Pre-Procedure Wound #26 Pre-procedure diagnosis of Wound #26 is a Venous Leg Ulcer located on the Left,Medial Lower Leg . There was a Haematologist Compression Therapy Procedure by [name], RN. Post procedure Diagnosis Wound #26: Same as Pre-Procedure Wound #27 Pre-procedure diagnosis of Wound #27 is a Venous Leg Ulcer located on the Left,Proximal,Lateral Lower Leg . There was a Haematologist Compression Therapy  Procedure by [name], RN. Post procedure Diagnosis Wound #27: Same as Pre-Procedure Wound #3R Pre-procedure diagnosis of Wound #3R is a Diabetic Wound/Ulcer of the Lower Extremity located on the Right,Posterior Calf . There was a Haematologist Compression Therapy Procedure by [name], RN. Post procedure Diagnosis Wound #3R: Same as Pre-Procedure Plan Follow-up Appointments: Return Appointment in 1 week. - Tuesday **********EXTRA TIME**************** Dressing Change Frequency: Wound #13 Left Toe Second: Change dressing every day. Wound #18 Left,Medial Foot: Do not change entire dressing for one week. Wound #19 Left,Circumferential Lower Leg: Do not change entire dressing for one week. Wound #20 Right Toe Great: Change dressing every day. Wound #21 Right Toe Second: Change dressing every day. Wound #22 Right Toe Third: Change dressing every day. Wound #24 Left,Distal,Anterior Lower Leg: Do not change entire dressing for one week. Wound #25 Left,Distal,Posterior Lower Leg: Do not change entire dressing for one week. Wound #26 Left,Medial Lower Leg: Do not change entire dressing for one week. Wound #27 Left,Proximal,Lateral Lower Leg: Do not change entire dressing for one week. Wound #3R Right,Posterior Calf: Do not change entire dressing for one week. Skin Barriers/Peri-Wound Care: TCA Cream or Ointment Wound Cleansing: May shower with protection. Primary Wound Dressing: Wound #13 Left Toe Second: Calcium Alginate with Silver Wound #18 Left,Medial Foot: Iodoflex Wound #19 Left,Circumferential Lower Leg: Iodoflex Wound #20 Right Toe Great: Calcium Alginate with Silver Wound #21 Right Toe Second: Calcium Alginate with Silver Wound #22 Right Toe Third: Calcium Alginate with Silver Wound #24 Left,Distal,Anterior Lower Leg: Iodoflex Wound #25 Left,Distal,Posterior Lower Leg: Iodoflex Wound #26 Left,Medial Lower Leg: Iodoflex Wound #27 Left,Proximal,Lateral Lower  Leg: Iodoflex Wound #3R Right,Posterior Calf: Iodoflex Secondary Dressing: Dry Gauze - secure toes with tape Edema Control: Unna Boots Bilaterally Off-Loading: Open toe surgical shoe to: - left and right foot 1. We are going to continue with Iodoflex to all wounds on his legs 2. Silver alginate to the wounds on his toes 3. I will attempt to not back the bone in the hammered part of the PIP joint on the left second toe 4. I do not know that this toe is going to be salvageable however I have informed him of this today. Electronic Signature(s) Signed: 10/17/2019 5:52:07 PM By: [name] MD Entered By: [name] on 10/17/2019 09:23:03 -------------------------------------------------------------------------------- SuperBill Details Patient Name: Date of Service: [name], [name] 10/17/2019 Medical Record [id number] Patient Account Number: [id number] Date of Birth/Sex: Treating RN: Jun 08, 1941 (79 y.o. M) Primary Care Provider: Hurshel Party Other Clinician: Referring Provider: Treating Provider/Extender:Valleri Hendricksen, Cheryl Flash, [place] Weeks in Treatment: 33 Diagnosis Coding ICD-10 Codes Code Description E11.51 Type 2 diabetes mellitus with diabetic peripheral angiopathy without gangrene L97.211 Non-pressure chronic ulcer of right calf limited to breakdown of skin E11.42 Type 2 diabetes mellitus with diabetic polyneuropathy L97.221 Non-pressure chronic ulcer of left calf limited to breakdown of skin I87.333 Chronic venous hypertension (idiopathic) with ulcer and inflammation of bilateral lower extremity L97.524 Non-pressure chronic ulcer of other part  of left foot with necrosis of bone L97.511 Non-pressure chronic ulcer of other part of right foot limited to breakdown of skin P59.45 Chronic systolic (congestive) heart failure Facility Procedures The patient participates with Medicare or their insurance follows the Medicare Facility Guidelines: CPT4 Code  Description Modifier Quantity [id number] 11042 - DEB SUBQ TISSUE 20 SQ CM/< 1 ICD-10 Diagnosis Description L97.221 Non-pressure chronic ulcer of  left calf limited to breakdown of skin Physician Procedures CPT4 Code Description: [id number] 11042 - WC PHYS SUBQ TISS 20 SQ CM ICD-10 Diagnosis Description L97.221 Non-pressure chronic ulcer of left calf limited to breakd Modifier: own of skin Quantity: 1 Electronic Signature(s) Signed: 10/17/2019 5:52:07 PM By: [name] MD Entered By: [name] on 10/17/2019 09:23:33

## 2019-10-18 NOTE — Telephone Encounter (Signed)
Gross, Daniel Bonier  10/17/2019 10:27 AM EST    Please let him know his kidney function is stable, ok to start Entresto 24-26 mg bid. Recheck BMET at f/u appt. Thanks   Spoke to pt wife who stated pt did not have an Rx for [employer]. Explained to pt wife that I will send in Rx to pharmacy. Pt wife stated she was not sure if they could afford it. Let pt wife know we have a coupon card here at the office that she can take to the pharmacy and should get first 30 days free. Pt wife stated she can pick it up today.  Informed pt wife that I will also leave lab slips with coupon card. Explained pt will need to have blood work rechecked at his next appt. Pt wife verbalized understanding and thanks.

## 2019-10-18 NOTE — Progress Notes (Signed)
[name], [name] ([id number]) Visit Report for 10/17/2019 Arrival Information Details Patient Name: Date of Service: [name], [name] 10/17/2019 8:15 AM Medical Record [place] Patient Account Number: [id number] Date of Birth/Sex: Treating RN: 04/19/1941 (79 y.o. [name] Primary Care Srihaan Mastrangelo: [place]hel Party Other Clinician: Referring Daivion Pape: Treating Hobart Marte/Extender:Robson, Cheryl Flash, [place] Weeks in Treatment: 56 Visit Information History Since Last Visit Added or deleted any medications: No Patient Arrived: Wheel Chair Any new allergies or adverse reactions: No Arrival Time: 08:10 Had a fall or experienced change in No Accompanied By: daughter activities of daily living that may affect Transfer Assistance: None risk of falls: Patient Identification Verified: Yes Signs or symptoms of abuse/neglect since last No Secondary Verification Process Yes visito Completed: Hospitalized since last visit: No Patient Requires Transmission- No Implantable device outside of the clinic excluding No Based Precautions: cellular tissue based products placed in the center Patient Has Alerts: Yes since last visit: Patient Alerts: Patient on Blood Has Dressing in Place as Prescribed: Yes Thinner Has Compression in Place as Prescribed: Yes Pain Present Now: No Electronic Signature(s) Signed: 10/17/2019 5:39:51 PM By: Deon Pilling Entered By: Deon Pilling on 10/17/2019 08:16:53 -------------------------------------------------------------------------------- Compression Therapy Details Patient Name: Date of Service: [name], [name] 10/17/2019 8:15 AM Medical Record [id number] Patient Account Number: [id number] Date of Birth/Sex: Treating RN: November 27, 1940 (79 y.o. [name]) [name] Primary Care Willis Holquin: H[place]hel Party Other Clinician: Referring Arline Ketter: Treating Suellyn Meenan/Extender:Robson, Cheryl Flash, [place] Weeks in Treatment: 33 Compression  Therapy Performed for Wound Wound #18 Left,Medial Foot Assessment: Performed By: Jake Church, RN Compression Type: Rolena Infante Post Procedure Diagnosis Same as Pre-procedure Electronic Signature(s) Signed: 10/18/2019 5:42:11 PM By: [name] RN Entered By: [name] on 10/17/2019 09:14:37 -------------------------------------------------------------------------------- Compression Therapy Details Patient Name: Date of Service: [name], [name] 10/17/2019 8:15 AM Medical Record [id number] Patient Account Number: [id number] Date of Birth/Sex: Treating RN: June 27, 1941 (79 y.o. [name]) [name] Primary Care Alok Minshall: H[place]hel Party Other Clinician: Referring Jacie Tristan: Treating Arla Boutwell/Extender:Robson, Cheryl Flash, [place] Weeks in Treatment: 33 Compression Therapy Performed for Wound Wound #19 Left,Anterior Lower Leg Assessment: Performed By: Jake Church, RN Compression Type: Rolena Infante Post Procedure Diagnosis Same as Pre-procedure Electronic Signature(s) Signed: 10/18/2019 5:42:11 PM By: [name] RN Entered By: [name] on 10/17/2019 09:14:38 -------------------------------------------------------------------------------- Compression Therapy Details Patient Name: Date of Service: [name], [name] 10/17/2019 8:15 AM Medical Record [id number] Patient Account Number: [id number] Date of Birth/Sex: Treating RN: [birth year] (79 y.o. [name]) [name] Primary Care Marzell Isakson: Hurshel Party Other Clinician: Referring Bradely Rudin: Treating Donell Tomkins/Extender:Robson, Cheryl Flash, [place] Weeks in Treatment: 33 Compression Therapy Performed for Wound Wound #24 Left,Distal,Anterior Lower Leg Assessment: Performed By: Jake Church, RN Compression Type: Rolena Infante Post Procedure Diagnosis Same as Pre-procedure Electronic Signature(s) Signed: 10/18/2019 5:42:11 PM By: [name] RN Entered By: [name] on 10/17/2019  09:14:38 -------------------------------------------------------------------------------- Compression Therapy Details Patient Name: Date of Service: [name], [name] 10/17/2019 8:15 AM Medical Record [id number] Patient Account Number: [id number] Date of Birth/Sex: Treating RN: 09-30-1940 (79 y.o. [name]) [name] Primary Care Jahfari Ambers: H[place]hel Party Other Clinician: Referring Ryelee Albee: Treating Jahbari Repinski/Extender:Robson, Cheryl Flash, [place] Weeks in Treatment: 33 Compression Therapy Performed for Wound Wound #25 Left,Distal,Posterior Lower Leg Assessment: Performed By: Jake Church, RN Compression Type: Rolena Infante Post Procedure Diagnosis Same as Pre-procedure Electronic Signature(s) Signed: 10/18/2019 5:42:11 PM By: [name] RN Entered By: [name] on 10/17/2019 09:14:38 -------------------------------------------------------------------------------- Compression Therapy Details Patient Name: Date of Service: [name], BAHL. 10/17/2019 8:15 AM Medical Record [id number] Patient  Account Number: [id number] Date of Birth/Sex: Treating RN: Aug 25, 1941 (79 y.o. [name]) [name] Primary Care Aylssa Herrig: Hurshel Party Other Clinician: Referring Melani Brisbane: Treating Aireona Torelli/Extender:Robson, Cheryl Flash, [place] Weeks in Treatment: 33 Compression Therapy Performed for Wound Wound #26 Left,Medial Lower Leg Assessment: Performed By: Clinician [name], RN Compression Type: Rolena Infante Post Procedure Diagnosis Same as Pre-procedure Electronic Signature(s) Signed: 10/18/2019 5:42:11 PM By: [name] RN Entered By: [name] on 10/17/2019 09:14:38 -------------------------------------------------------------------------------- Compression Therapy Details Patient Name: Date of Service: [name], [name] 10/17/2019 8:15 AM Medical Record [id number] Patient Account Number: [id number] Date of Birth/Sex: Treating RN: 10/31/1940 (79 y.o. [name])  [name] Primary Care Demira Gwynne: Hurshel Party Other Clinician: Referring Kellee Sittner: Treating Santrice Muzio/Extender:Robson, Cheryl Flash, [place] Weeks in Treatment: 33 Compression Therapy Performed for Wound Wound #27 Left,Proximal,Lateral Lower Leg Assessment: Performed By: Jake Church, RN Compression Type: Rolena Infante Post Procedure Diagnosis Same as Pre-procedure Electronic Signature(s) Signed: 10/18/2019 5:42:11 PM By: [name] RN Entered By: [name] on 10/17/2019 09:14:39 -------------------------------------------------------------------------------- Compression Therapy Details Patient Name: Date of Service: [name], [name] 10/17/2019 8:15 AM Medical Record [id number] Patient Account Number: [id number] Date of Birth/Sex: Treating RN: 10-08-1940 (79 y.o. [name]) [name] Primary Care Trenisha Lafavor: Hurshel Party Other Clinician: Referring Neda Willenbring: Treating Moon Budde/Extender:Robson, Cheryl Flash, [place] Weeks in Treatment: 33 Compression Therapy Performed for Wound Wound #3R Right,Posterior Calf Assessment: Performed By: Jake Church, RN Compression Type: Rolena Infante Post Procedure Diagnosis Same as Pre-procedure Electronic Signature(s) Signed: 10/18/2019 5:42:11 PM By: [name] RN Entered By: [name] on 10/17/2019 09:14:39 -------------------------------------------------------------------------------- Encounter Discharge Information Details Patient Name: Date of Service: Daniel Pia D. 10/17/2019 8:15 AM Medical Record [id number] Patient Account Number: [id number] Date of Birth/Sex: Treating RN: November 15, 1940 (79 y.o. [name] Primary Care Melady Chow: Hurshel Party Other Clinician: Referring Talli Kimmer: Treating Brendyn Mclaren/Extender:Robson, Cheryl Flash, [place] Weeks in Treatment: 56 Encounter Discharge Information Items Post Procedure Vitals Discharge Condition: Stable Temperature (F): 98 Ambulatory  Status: Wheelchair Pulse (bpm): 63 Discharge Destination: Home Respiratory Rate (breaths/min): 20 Transportation: Private Auto Blood Pressure (mmHg): 110/70 Accompanied By: daughter Schedule Follow-up Appointment: Yes Clinical Summary of Care: Patient Declined Electronic Signature(s) Signed: 10/17/2019 5:45:51 PM By: Kela Millin Entered By: Kela Millin on 10/17/2019 09:53:38 -------------------------------------------------------------------------------- Lower Extremity Assessment Details Patient Name: Date of Service: [name], [name] 10/17/2019 8:15 AM Medical Record [id number] Patient Account Number: [id number] Date of Birth/Sex: Treating RN: Jun 16, 1941 (79 y.o. [name] Primary Care Pheonix Wisby: Hurshel Party Other Clinician: Referring Luke Rigsbee: Treating Maison Kestenbaum/Extender:Robson, Cheryl Flash, [place] Weeks in Treatment: 33 Edema Assessment Assessed: [Left: Yes] [Right: Yes] Edema: [Left: Yes] [Right: Yes] Calf Left: Right: Point of Measurement: 31 cm From Medial Instep 30 cm 30.5 cm Ankle Left: Right: Point of Measurement: 11 cm From Medial Instep 20.5 cm 21 cm Electronic Signature(s) Signed: 10/17/2019 5:39:51 PM By: Deon Pilling Entered By: Deon Pilling on 10/17/2019 08:25:07 -------------------------------------------------------------------------------- Multi Wound Chart Details Patient Name: Date of Service: Daniel Pia D. 10/17/2019 8:15 AM Medical Record [id number] Patient Account Number: [id number] Date of Birth/Sex: Treating RN: 01-27-1941 (79 y.o. [name]) Primary Care Amar Sippel: Hurshel Party Other Clinician: Referring Leeyah Heather: Treating Zahari [name]/Extender:Robson, Cheryl Flash, [place] Weeks in Treatment: 77 Vital Signs Height(in): 74 Pulse(bpm): 63 Weight(lbs): 212 Blood Pressure(mmHg): 110/70 Body Mass Index(BMI): 27 Temperature(F): 98 Respiratory 20 Rate(breaths/min): Photos: [13:No Photos] [18:No Photos] [19:No  Photos] Wound Location: [13:Left Toe Second] [18:Left Foot - Medial] [19:Left Lower Leg - Anterior] Wounding Event: [13:Gradually Appeared] [18:Gradually Appeared] [19:Gradually Appeared] Primary Etiology: [13:Diabetic Wound/Ulcer of the Diabetic Wound/Ulcer of  the Venous Leg Ulcer Lower Extremity] [18:Lower Extremity] Secondary Etiology: [13:N/A] [18:N/A] [19:N/A] Comorbid History: [13:Cataracts, Hypertension, Cataracts, Hypertension, Cataracts, Hypertension, Peripheral Venous Disease, Peripheral Venous Disease, Peripheral Venous Disease, Type II Diabetes, Gout, Received Radiation] [18:Type II Diabetes, Gout,  Received Radiation] [19:Type II Diabetes, Gout, Received Radiation] Date Acquired: [13:08/22/2019] [18:09/03/2019] [19:09/23/2019] Weeks of Treatment: [13:8] [18:6] [19:3] Wound Status: [13:Open] [18:Open] [19:Open] Wound Recurrence: [13:No] [18:No] [19:No] Clustered Wound: [13:No] [18:No] [19:Yes] Clustered Quantity: [13:N/A] [18:N/A] [19:1] Measurements L x W x D 1.9x1.7x0.2 [18:0.1x0.1x0.1] [19:2.5x1.4x0.1] (cm) Area (cm) : [13:2.537] [03:4.742] [19:2.749] Volume (cm) : [13:0.507] [18:0.001] [19:0.275] % Reduction in Area: [13:17.60%] [18:99.20%] [19:94.90%] % Reduction in Volume: -64.60% [18:99.00%] [19:94.90%] Classification: [13:Grade 2] [18:Grade 2] [19:Full Thickness Without Exposed Support Structures] Exudate Amount: [13:Medium] [18:Small] [19:Medium] Exudate Type: [13:Serosanguineous] [18:Serosanguineous] [19:Serosanguineous] Exudate Color: [13:red, brown] [18:red, brown] [19:red, brown] Wound Margin: [13:Distinct, outline attached Flat and Intact] [19:Flat and Intact] Granulation Amount: [13:None Present (0%)] [18:Large (67-100%)] [19:Medium (34-66%)] Granulation Quality: [13:N/A] [18:Red, Pink] [19:Red, Pink] Necrotic Amount: [13:Large (67-100%)] [18:Small (1-33%)] [19:Medium (34-66%)] Exposed Structures: [13:Fat Layer (Subcutaneous Fat Layer (Subcutaneous Fat Layer  (Subcutaneous Tissue) Exposed: Yes Joint: Yes Bone: Yes Fascia: No Tendon: No Muscle: No] [18:Tissue) Exposed: Yes Fascia: No Tendon: No Muscle: No Joint: No Bone: No] [19:Tissue) Exposed: Yes  Fascia: No Tendon: No Muscle: No Joint: No Bone: No] Epithelialization: [13:None] [18:Large (67-100%)] [19:None] Debridement: [13:N/A] [18:N/A] [19:Debridement - Excisional] Pre-procedure [13:N/A] [18:N/A] [19:09:07] Verification/Time Out Taken: Pain Control: [13:N/A] [18:N/A] [19:Lidocaine 5% topical ointment] Tissue Debrided: [13:N/A] [18:N/A] [19:Subcutaneous, Slough] Level: [13:N/A] [18:N/A] [19:Skin/Subcutaneous Tissue] Debridement Area (sq cm):N/A [18:N/A] [19:3.5] Instrument: [13:N/A] [18:N/A] [19:Curette] Bleeding: [13:N/A] [18:N/A] [19:Moderate] Hemostasis Achieved: [13:N/A] [18:N/A] [19:Pressure] Procedural Pain: [13:N/A] [18:N/A] [19:3] Post Procedural Pain: [13:N/A] [18:N/A] [19:0] Debridement Treatment N/A [18:N/A] [19:Procedure was tolerated] Response: [19:well] Post Debridement [13:N/A] [18:N/A] [19:2.5x1.4x0.1] Measurements L x W x D (cm) Post Debridement [13:N/A] [18:N/A] [19:0.275] Volume: (cm) Assessment Notes: [13:N/A] [18:N/A] [19:N/A] Procedures Performed: N/A [13:20] [18:Compression Therapy 21] [19:Compression Therapy Debridement 22] Photos: [13:No Photos] [18:No Photos] [19:No Photos] Wound Location: [13:Right Toe Great] [18:Right Toe Second] [19:Right Toe Third] Wounding Event: [13:Gradually Appeared] [18:Gradually Appeared] [19:Gradually Appeared] Primary Etiology: [13:Diabetic Wound/Ulcer of the Diabetic Wound/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] [18:Lower Extremity] [19:Lower Extremity] Secondary Etiology: [13:N/A] [18:N/A] [19:N/A] Comorbid History: [13:Cataracts, Hypertension, Cataracts, Hypertension, Cataracts, Hypertension, Peripheral Venous Disease, Peripheral Venous Disease, Peripheral Venous Disease, Type II Diabetes, Gout, Received  Radiation] [18:Type II Diabetes, Gout,  Received Radiation] [19:Type II Diabetes, Gout, Received Radiation] Date Acquired: [13:09/23/2019] [18:09/23/2019] [19:09/23/2019] Weeks of Treatment: [13:3] [18:3] [19:3] Wound Status: [13:Open] [18:Open] [19:Open] Wound Recurrence: [13:No] [18:No] [19:No] Clustered Wound: [13:No] [18:No] [19:Yes] Clustered Quantity: [13:N/A] [18:N/A] [19:2] Measurements L x W x D 1.3x0.4x0.1 [18:1.3x1.1x0.1] [19:1x0.4x0.2] (cm) Area (cm) : [13:0.408] [18:1.123] [19:0.314] Volume (cm) : [13:0.041] [18:0.112] [19:0.063] % Reduction in Area: [13:20.20%] [18:-70.20%] [19:73.30%] % Reduction in Volume: 19.60% [18:-69.70%] [19:46.60%] Classification: [13:Grade 2] [18:Grade 2] [19:Grade 2] Exudate Amount: [13:Small] [18:Small] [19:Small] Exudate Type: [13:Serosanguineous] [18:Serosanguineous] [19:Serosanguineous] Exudate Color: [13:red, brown] [18:red, brown] [19:red, brown] Wound Margin: [13:Distinct, outline attached Flat and Intact] [19:Flat and Intact] Granulation Amount: [13:Medium (34-66%)] [18:Medium (34-66%)] [19:Medium (34-66%)] Granulation Quality: [13:Pink] [18:Pink] [19:Red, Pink] Necrotic Amount: [13:Medium (34-66%)] [18:Medium (34-66%)] [19:Medium (34-66%)] Exposed Structures: [13:Fat Layer (Subcutaneous Tissue) Exposed: Yes Fascia: No Tendon: No Muscle: No Joint: No Bone: No] [18:Fat Layer (Subcutaneous Tissue) Exposed: Yes Fascia: No Tendon: No Muscle: No Joint: No Bone: No] [19:Fat Layer (Subcutaneous Tissue) Exposed: Yes  Fascia: No Tendon:  No Muscle: No Joint: No Bone: No] Epithelialization: [13:None] [18:None] [19:Small (1-33%)] Debridement: [13:N/A] [18:N/A] [19:N/A] Pain Control: [13:N/A] [18:N/A] [19:N/A] Tissue Debrided: [13:N/A] [18:N/A] [19:N/A] Level: [13:N/A] [18:N/A] [19:N/A] Debridement Area (sq cm):N/A [18:N/A] [19:N/A] Instrument: [13:N/A] [18:N/A] [19:N/A] Bleeding: [13:N/A] [18:N/A] [19:N/A] Hemostasis Achieved: [13:N/A] [18:N/A]  [19:N/A] Procedural Pain: [13:N/A] [18:N/A] [19:N/A] Post Procedural Pain: [13:N/A] [18:N/A] [19:N/A] Debridement Treatment N/A [18:N/A] [19:N/A] Response: Post Debridement [13:N/A] [18:N/A] [19:N/A] Measurements L x W x D (cm) Post Debridement [13:N/A] [18:N/A] [19:N/A] Volume: (cm) Assessment Notes: [13:N/A] [18:N/A] [19:N/A] Procedures Performed: N/A [13:24] [18:N/A 25] [19:N/A 40] Photos: [13:No Photos] [18:No Photos] [19:No Photos] Wound Location: [13:Left Lower Leg - Anterior, Left Lower Leg - Posterior, Left Lower Leg - Medial Distal] [18:Distal] Wounding Event: [13:Gradually Appeared] [18:Gradually Appeared] [19:Gradually Appeared] Primary Etiology: [13:Venous Leg Ulcer] [18:Venous Leg Ulcer] [19:Venous Leg Ulcer] Secondary Etiology: [13:Diabetic Wound/Ulcer of the Diabetic Wound/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] [18:Lower Extremity] [19:Lower Extremity] Comorbid History: [13:Cataracts, Hypertension, Cataracts, Hypertension, Cataracts, Hypertension, Peripheral Venous Disease, Peripheral Venous Disease, Peripheral Venous Disease, Type II Diabetes, Gout, Received Radiation] [18:Type II Diabetes, Gout,  Received Radiation] [19:Type II Diabetes, Gout, Received Radiation] Date Acquired: [13:10/04/2019] [18:10/04/2019] [19:10/04/2019] Weeks of Treatment: [13:1] [18:1] [19:1] Wound Status: [13:Open] [18:Open] [19:Open] Wound Recurrence: [13:No] [18:No] [19:No] Clustered Wound: [13:No] [18:Yes] [19:No] Clustered Quantity: [13:N/A] [18:3] [19:N/A] Measurements L x W x D 5.5x2x0.2 [18:6.5x3x0.1] [19:1.7x0.8x0.1] (cm) Area (cm) : [13:8.639] [18:15.315] [19:1.068] Volume (cm) : [13:1.728] [18:1.532] [19:0.107] % Reduction in Area: [13:-35.50%] [18:38.10%] [19:-14.20%] % Reduction in Volume: -170.80% [18:38.10%] [19:-15.10%] Classification: [13:Full Thickness Without Exposed Support Structures Exposed Support Structures Exposed Support Structures] [18:Full Thickness  Without] [19:Full Thickness Without] Exudate Amount: [13:Medium] [18:Medium] [19:Small] Exudate Type: [13:Serosanguineous] [18:Serosanguineous] [19:Serosanguineous] Exudate Color: [13:red, brown] [18:red, brown] [19:red, brown] Wound Margin: [13:Flat and Intact] [18:Flat and Intact] [19:Flat and Intact] Granulation Amount: [13:Medium (34-66%)] [18:Medium (34-66%)] [19:Large (67-100%)] Granulation Quality: [13:Red] [18:Red, Pink] [19:Red] Necrotic Amount: [13:Medium (34-66%)] [18:Medium (34-66%)] [19:Small (1-33%)] Exposed Structures: [13:Fat Layer (Subcutaneous Tissue) Exposed: Yes Fascia: No Tendon: No Muscle: No Joint: No Bone: No] [18:Fat Layer (Subcutaneous Tissue) Exposed: Yes Fascia: No Tendon: No Muscle: No Joint: No Bone: No] [19:Fat Layer (Subcutaneous Tissue) Exposed: Yes  Fascia: No Tendon: No Muscle: No Joint: No Bone: No] Epithelialization: [13:Small (1-33%)] [18:Medium (34-66%)] [19:Small (1-33%)] Debridement: [13:Debridement - Excisional] [18:N/A] [19:N/A] Pre-procedure [13:09:07] [18:N/A] [19:N/A] Verification/Time Out Taken: Pain Control: [13:Lidocaine 5% topical ointment] [18:N/A] [19:N/A] Tissue Debrided: [13:Subcutaneous, Slough] [18:N/A] [19:N/A] Level: [13:Skin/Subcutaneous Tissue] [18:N/A] [19:N/A] Debridement Area (sq cm):11 [18:N/A] [19:N/A] Instrument: [13:Curette] [18:N/A] [19:N/A] Bleeding: [13:Moderate] [18:N/A] [19:N/A] Hemostasis Achieved: [13:Pressure] [18:N/A] [19:N/A] Procedural Pain: [13:3] [18:N/A] [19:N/A] Post Procedural Pain: [13:0] [18:N/A] [19:N/A] Debridement Treatment Procedure was tolerated [18:N/A] [19:N/A] Response: [13:well] Post Debridement [13:5.5x2x0.2] [18:N/A] [19:N/A] Measurements L x W x D (cm) Post Debridement [13:1.728] [18:N/A] [19:N/A] Volume: (cm) Assessment Notes: [13:N/A] [18:N/A] [19:N/A] Procedures Performed: Compression Therapy [13:Debridement] [18:Compression Therapy 27] [19:Compression Therapy 3R N/A] Photos: [13:No  Photos] [18:No Photos] [19:N/A] Wound Location: [13:Left Lower Leg - Lateral, Proximal] [18:Right Calf - Posterior] [19:N/A] Wounding Event: [13:Gradually Appeared] [18:Gradually Appeared] [19:N/A] Primary Etiology: [13:Venous Leg Ulcer] [18:Diabetic Wound/Ulcer of the N/A Lower Extremity] Secondary Etiology: [13:Diabetic Wound/Ulcer of the N/A Lower Extremity] [19:N/A] Comorbid History: [13:Cataracts, Hypertension, Cataracts, Hypertension, N/A Peripheral Venous Disease, Peripheral Venous Disease, Type II Diabetes, Gout, Received Radiation] [18:Type II Diabetes, Gout, Received Radiation] Date Acquired: [13:10/04/2019] [18:02/28/2019] [19:N/A] Weeks of Treatment: [13:1] [18:33] [19:N/A] Wound Status: [13:Open] [18:Open] [19:N/A] Wound Recurrence: [13:No] [18:Yes] [19:N/A] Clustered Wound: [  13:No] [18:No] [19:N/A] Clustered Quantity: [13:N/A] [18:N/A] [19:N/A] Measurements L x W x D 0.1x0.1x0.1 [18:1.8x1.2x0.1] [19:N/A] (cm) Area (cm) : [80:1.655] [18:1.696] [19:N/A] Volume (cm) : [13:0.001] [18:0.17] [19:N/A] % Reduction in Area: [13:99.90%] [18:93.10%] [19:N/A] % Reduction in Volume: 99.90% [18:93.10%] [19:N/A] Classification: [13:Full Thickness Without Exposed Support Structures] [18:Grade 2] [19:N/A] Exudate Amount: [13:None Present] [18:Medium] [19:N/A] Exudate Type: [13:N/A] [18:Serosanguineous] [19:N/A] Exudate Color: [13:N/A] [18:red, brown] [19:N/A] Wound Margin: [13:Flat and Intact] [18:Distinct, outline attached] [19:N/A] Granulation Amount: [13:None Present (0%)] [18:Large (67-100%)] [19:N/A] Granulation Quality: [13:N/A] [18:Red] [19:N/A] Necrotic Amount: [13:None Present (0%)] [18:None Present (0%)] [19:N/A] Exposed Structures: [13:Fat Layer (Subcutaneous Tissue) Exposed: Yes Fascia: No Tendon: No Muscle: No Joint: No Bone: No] [18:Fat Layer (Subcutaneous Tissue) Exposed: Yes Fascia: No Tendon: No Muscle: No Joint: No Bone: No] [19:N/A] Epithelialization: [13:None] [18:Medium  (34-66%)] [19:N/A] Debridement: [13:N/A] [18:N/A] [19:N/A] Pain Control: [13:N/A] [18:N/A] [19:N/A] Tissue Debrided: [13:N/A] [18:N/A] [19:N/A] Level: [13:N/A] [18:N/A] [19:N/A] Debridement Area (sq cm):N/A [18:N/A] [19:N/A] Instrument: [13:N/A] [18:N/A] [19:N/A] Bleeding: [13:N/A] [18:N/A] [19:N/A] Hemostasis Achieved: [13:N/A] [18:N/A] [19:N/A] Procedural Pain: [13:N/A] [18:N/A] [19:N/A] Post Procedural Pain: [13:N/A] [18:N/A] [19:N/A] Debridement Treatment N/A [18:N/A] [19:N/A] Response: Post Debridement [13:N/A] [18:N/A] [19:N/A] Measurements L x W x D (cm) Post Debridement [13:N/A] [18:N/A] [19:N/A] Volume: (cm) Assessment Notes: [13:fibrin/scabbed over area.] [18:N/A Compression Therapy] [19:N/A N/A] Treatment Notes Electronic Signature(s) Signed: 10/17/2019 5:52:07 PM By: [name] MD Entered By: [name] on 10/17/2019 37:48:27 -------------------------------------------------------------------------------- Multi-Disciplinary Care Plan Details Patient Name: Date of Service: Daniel Pia D. 10/17/2019 8:15 AM Medical Record [id number] Patient Account Number: [id number] Date of Birth/Sex: Treating RN: Jul 10, 1941 (79 y.o. [name]) [name] Primary Care Pamelia Botto: Hurshel Party Other Clinician: Referring Kessler Solly: Treating Lillie Bollig/Extender:Robson, Cheryl Flash, [place] Weeks in Treatment: 33 Active Inactive Venous Leg Ulcer Nursing Diagnoses: Knowledge deficit related to disease process and management Potential for venous Insuffiency (use before diagnosis confirmed) Goals: Patient will maintain optimal edema control Date Initiated: 10/04/2019 Target Resolution Date: 11/01/2019 Goal Status: Active Interventions: Assess peripheral edema status every visit. Compression as ordered Treatment Activities: Therapeutic compression applied : 10/04/2019 Notes: Wound/Skin Impairment Nursing Diagnoses: Knowledge deficit related to ulceration/compromised skin  integrity Goals: Patient/caregiver will verbalize understanding of skin care regimen Date Initiated: 02/28/2019 Target Resolution Date: 11/01/2019 Goal Status: Active Ulcer/skin breakdown will have a volume reduction of 30% by week 4 Date Initiated: 02/28/2019 Date Inactivated: 04/04/2019 Target Resolution Date: 03/31/2019 Goal Status: Met Ulcer/skin breakdown will have a volume reduction of 50% by week 8 Date Initiated: 04/04/2019 Date Inactivated: 05/09/2019 Target Resolution Date: 05/05/2019 Goal Status: Met Ulcer/skin breakdown will have a volume reduction of 80% by week 12 Date Initiated: 05/09/2019 Date Inactivated: 06/13/2019 Target Resolution Date: 06/09/2019 Unmet Goal Status: Unmet Reason: comorbities/new wounds Ulcer/skin breakdown will heal within 14 weeks Date Initiated: 06/13/2019 Date Inactivated: 07/11/2019 Target Resolution Date: 07/07/2019 Unmet Goal Status: Unmet Reason: comorbityies Interventions: Assess patient/caregiver ability to obtain necessary supplies Assess patient/caregiver ability to perform ulcer/skin care regimen upon admission and as needed Assess ulceration(s) every visit Notes: Electronic Signature(s) Signed: 10/18/2019 5:42:11 PM By: [name] RN Entered By: [name] on 10/17/2019 08:22:40 -------------------------------------------------------------------------------- Pain Assessment Details Patient Name: Date of Service: [name], [name] 10/17/2019 8:15 AM Medical Record [id number] Patient Account Number: [id number] Date of Birth/Sex: Treating RN: 12/22/1940 (79 y.o. [name] Primary Care Felcia Huebert: Hurshel Party Other Clinician: Referring Ashna Dorough: Treating Nisha Dhami/Extender:Robson, Cheryl Flash, [place] Weeks in Treatment: 69 Active Problems Location of Pain Severity and Description of Pain Patient Has Paino No Site Locations Rate the  pain. Current Pain Level: 0 Pain Management and Medication Current Pain  Management: Medication: No Cold Application: No Rest: No Massage: No Activity: No T.E.N.S.: No Heat Application: No Leg drop or elevation: No Is the Current Pain Management Adequate: Adequate How does your wound impact your activities of daily livingo Sleep: No Bathing: No Appetite: No Relationship With Others: No Bladder Continence: No Emotions: No Bowel Continence: No Work: No Toileting: No Drive: No Dressing: No Hobbies: No Electronic Signature(s) Signed: 10/17/2019 5:39:51 PM By: Deon Pilling Entered By: Deon Pilling on 10/17/2019 08:17:04 -------------------------------------------------------------------------------- Patient/Caregiver Education Details Patient Name: Date of Service: [name] 1/19/2021andnbsp8:15 AM Medical Record [id number] Patient Account Number: [id number] Date of Birth/Gender: Treating RN: March 02, 1941 (79 y.o. [name]) [name] Primary Care Physician: Hurshel Party Other Clinician: Referring Physician: Treating Physician/Extender:Robson, Cheryl Flash, [facility] Weeks in Treatment: 47 Education Assessment Education Provided To: Patient Education Topics Provided Wound/Skin Impairment: Methods: Explain/Verbal Responses: State content correctly Electronic Signature(s) Signed: 10/18/2019 5:42:11 PM By: [name] RN Entered By: [name] on 10/17/2019 08:22:57 -------------------------------------------------------------------------------- Wound Assessment Details Patient Name: Date of Service: [name], MCGREAL. 10/17/2019 8:15 AM Medical Record [id number] Patient Account Number: [id number] Date of Birth/Sex: Treating RN: 05/24/1941 (79 y.o. [name] Primary Care Brucha Ahlquist: [place]hel Party Other Clinician: Referring Trenden Hazelrigg: Treating Aleanna Menge/Extender:Robson, Cheryl Flash, [place] Weeks in Treatment: 33 Wound Status Wound Number: 13 Primary Diabetic Wound/Ulcer of the Lower Extremity Etiology: Wound  Location: Left Toe Second Wound Open Wounding Event: Gradually Appeared Status: Date Acquired: 08/22/2019 Comorbid Cataracts, Hypertension, Peripheral Venous Weeks Of Treatment: 8 History: Disease, Type II Diabetes, Gout, Received Clustered Wound: No Radiation Photos Wound Measurements Length: (cm) 1.9 Width: (cm) 1.7 Depth: (cm) 0.2 Area: (cm) 2.537 Volume: (cm) 0.507 Wound Description Classification: Grade 2 Wound Margin: Distinct, outline attached Exudate Amount: Medium Exudate Type: Serosanguineous Exudate Color: red, brown Wound Bed Granulation Amount: None Present (0%) Necrotic Amount: Large (67-100%) Necrotic Quality: Adherent Slough After Cleansing: No rino Yes Exposed Structure sed: No Subcutaneous Tissue) Exposed: Yes sed: No sed: No ed: Yes d: Yes % Reduction in Area: 17.6% % Reduction in Volume: -64.6% Epithelialization: None Tunneling: No Undermining: No Foul Odor Slough/Fib Fascia Expo Fat Layer ( Tendon Expo Muscle Expo Joint Expos Bone Expose Treatment Notes Wound #13 (Left Toe Second) 1. Cleanse With Wound Cleanser Soap and water 3. Primary Dressing Applied Calcium Alginate Ag 4. Secondary Dressing Dry Gauze Notes netting Electronic Signature(s) Signed: 10/17/2019 4:37:08 PM By: Mikeal Hawthorne EMT/HBOT Signed: 10/17/2019 5:39:51 PM By: Deon Pilling Entered By: Mikeal Hawthorne on 10/17/2019 14:39:58 -------------------------------------------------------------------------------- Wound Assessment Details Patient Name: Date of Service: RENDELL, THIVIERGE. 10/17/2019 8:15 AM Medical Record [id number] Patient Account Number: [id number] Date of Birth/Sex: Treating RN: 1941/02/11 (79 y.o. [name] Primary Care Rise Traeger: Hurshel Party Other Clinician: Referring Deseray Daponte: Treating Zayla Agar/Extender:Robson, Cheryl Flash, [place] Weeks in Treatment: 33 Wound Status Wound Number: 18 Primary Diabetic Wound/Ulcer of the  Lower Extremity Etiology: Wound Location: Left Foot - Medial Wound Open Wounding Event: Gradually Appeared Status: Date Acquired: 09/03/2019 Comorbid Cataracts, Hypertension, Peripheral Venous Weeks Of Treatment: 6 History: Disease, Type II Diabetes, Gout, Received Clustered Wound: No Radiation Photos Wound Measurements Length: (cm) 0.1 % Reduction in Are Width: (cm) 0.1 % Reduction in Vol Depth: (cm) 0.1 Epithelialization: Area: (cm) 0.008 Tunneling: Volume: (cm) 0.001 Undermining: Wound Description Classification: Grade 2 Foul Odor After Cl Wound Margin: Flat and Intact Slough/Fibrino Exudate Amount: Small Exudate Type: Serosanguineous Exudate Color: red, brown Wound Bed Granulation Amount: Large (67-100%) E  Granulation Quality: Red, Pink Fascia Exposed: Necrotic Amount: Small (1-33%) Fat Layer Welton Flakes Necrotic Quality: Adherent Slough Tendon Exposed: Muscle Exposed: Joint Exposed: Bone Exposed: eansing: No Yes xposed Structure No neous Tissue) Exposed: Yes No No No No a: 99.2% ume: 99% Large (67-100%) No No Treatment Notes Wound #18 (Left, Medial Foot) 1. Cleanse With Wound Cleanser Soap and water 2. Periwound Care TCA Cream 3. Primary Dressing Applied Iodoflex 4. Secondary Dressing Dry Gauze 6. Support Layer Applied Kerlix/Coban Product manager) Signed: 10/17/2019 4:37:08 PM By: Mikeal Hawthorne EMT/HBOT Signed: 10/17/2019 5:39:51 PM By: Deon Pilling Entered By: Mikeal Hawthorne on 10/17/2019 14:40:31 -------------------------------------------------------------------------------- Wound Assessment Details Patient Name: Date of Service: DONJUAN, ROBISON 10/17/2019 8:15 AM Medical Record [id number] Patient Account Number: [id number] Date of Birth/Sex: Treating RN: [birth year] (79 y.o. [name] Primary Care Abella Shugart: Hurshel Party Other Clinician: Referring Najat Olazabal: Treating Aerilynn Goin/Extender:Robson,  Cheryl Flash, [place] Weeks in Treatment: 33 Wound Status Wound Number: 19 Primary Venous Leg Ulcer Etiology: Wound Location: Left Lower Leg - Anterior Wound Open Wounding Event: Gradually Appeared Status: Date Acquired: 09/23/2019 Comorbid Cataracts, Hypertension, Peripheral Venous Weeks Of Treatment: 3 History: Disease, Type II Diabetes, Gout, Received Clustered Wound: Yes Radiation Photos Wound Measurements Length: (cm) 2.5 % Reduct Width: (cm) 1.4 % Reduct Depth: (cm) 0.1 Epitheli Clustered Quantity: 1 Tunnelin Area: (cm) 2.749 Undermi Volume: (cm) 0.275 Wound Description Classification: Full Thickness Without Exposed Support Foul Odo Structures Classification: Structures Slough Wound Flat and Intact Margin: Exudate Medium Amount: Exudate Serosanguineous Type: Exudate red, brown Color: Wound Bed Granulation Amount: Medium (34-66%) Granulation Quality: Red, Pink Fascia Necrotic Amount: Medium (34-66%) Fat Lay Necrotic Quality: Adherent Slough Tendon Muscle Joint E Bone Ex r After Cleansing: No /Fibrino Yes Exposed Structure Exposed: No er (Subcutaneous Tissue) Exposed: Yes Exposed: No Exposed: No xposed: No posed: No ion in Area: 94.9% ion in Volume: 94.9% alization: None g: No ning: No Treatment Notes Wound #19 (Left, Anterior Lower Leg) 1. Cleanse With Wound Cleanser Soap and water 2. Periwound Care TCA Cream 3. Primary Dressing Applied Iodoflex 4. Secondary Dressing Dry Gauze 6. Support Layer Applied Kerlix/Coban Product manager) Signed: 10/17/2019 4:37:08 PM By: Mikeal Hawthorne EMT/HBOT Signed: 10/17/2019 5:39:51 PM By: Deon Pilling Entered By: Mikeal Hawthorne on 10/17/2019 14:54:01 -------------------------------------------------------------------------------- Wound Assessment Details Patient Name: Date of Service: SAVIOR, HIMEBAUGH 10/17/2019 8:15 AM Medical Record [id number] Patient Account Number:  [id number] Date of Birth/Sex: Treating RN: [birth year] (79 y.o. [name] Primary Care Christo Hain: Hurshel Party Other Clinician: Referring Lucendia Leard: Treating Estelene Carmack/Extender:Robson, Cheryl Flash, [place] Weeks in Treatment: 33 Wound Status Wound Number: 20 Primary Diabetic Wound/Ulcer of the Lower Extremity Etiology: Wound Location: Right Toe Great Wound Open Wounding Event: Gradually Appeared Status: Status: Date Acquired: 09/23/2019 Comorbid Cataracts, Hypertension, Peripheral Venous Weeks Of Treatment: 3 History: Disease, Type II Diabetes, Gout, Received Clustered Wound: No Radiation Photos Wound Measurements Length: (cm) 1.3 Width: (cm) 0.4 Depth: (cm) 0.1 Area: (cm) 0.408 Volume: (cm) 0.041 Wound Description Classification: Grade 2 Wound Margin: Distinct, outline attached Exudate Amount: Small Exudate Type: Serosanguineous Exudate Color: red, brown Wound Bed Granulation Amount: Medium (34-66%) Granulation Quality: Pink Necrotic Amount: Medium (34-66%) Necrotic Quality: Adherent Slough After Cleansing: No brino Yes Exposed Structure osed: No (Subcutaneous Tissue) Exposed: Yes osed: No osed: No sed: No ed: No % Reduction in Area: 20.2% % Reduction in Volume: 19.6% Epithelialization: None Tunneling: No Undermining: No Foul Odor Slough/Fi Fascia Exp Fat Layer Tendon Exp Muscle Exp Joint Expo Bone Expos Treatment Notes  Wound #20 (Right Toe Great) 1. Cleanse With Wound Cleanser Soap and water 3. Primary Dressing Applied Calcium Alginate Ag 4. Secondary Dressing Dry Gauze Notes netting Electronic Signature(s) Signed: 10/17/2019 4:37:08 PM By: Mikeal Hawthorne EMT/HBOT Signed: 10/17/2019 5:39:51 PM By: Deon Pilling Entered By: Mikeal Hawthorne on 10/17/2019 14:33:48 -------------------------------------------------------------------------------- Wound Assessment Details Patient Name: Date of Service: COY, ROCHFORD. 10/17/2019 8:15  AM Medical Record [id number] Patient Account Number: [id number] Date of Birth/Sex: Treating RN: Jun 19, 1941 (79 y.o. [name] Primary Care Salem Lembke: Hurshel Party Other Clinician: Referring Dacari Beckstrand: Treating Quoc Tome/Extender:Robson, Cheryl Flash, [place] Weeks in Treatment: 33 Wound Status Wound Number: 21 Primary Diabetic Wound/Ulcer of the Lower Extremity Etiology: Wound Location: Right Toe Second Wound Open Wounding Event: Gradually Appeared Status: Date Acquired: 09/23/2019 Comorbid Cataracts, Hypertension, Peripheral Venous Weeks Of Treatment: 3 History: Disease, Type II Diabetes, Gout, Received Clustered Wound: No Radiation Photos Wound Measurements Length: (cm) 1.3 % Reduction in Are Width: (cm) 1.1 % Reduction in Vol Depth: (cm) 0.1 Epithelialization: Area: (cm) 1.123 Tunneling: Volume: (cm) 0.112 Undermining: Wound Description Classification: Grade 2 Foul Odor After Cl Wound Margin: Flat and Intact Slough/Fibrino Exudate Amount: Small Exudate Type: Serosanguineous Exudate Color: red, brown Wound Bed Granulation Amount: Medium (34-66%) E Granulation Quality: Pink Fascia Exposed: Necrotic Amount: Medium (34-66%) Fat Layer Wel[place] Flakes Necrotic Quality: Adherent Slough Tendon Exposed: Muscle Exposed: Joint Exposed: Bone Exposed: eansing: No Yes xposed Structure No neous Tissue) Exposed: Yes No No No No a: -70.2% ume: -69.7% None No No Treatment Notes Wound #21 (Right Toe Second) 1. Cleanse With Wound Cleanser Soap and water 3. Primary Dressing Applied Calcium Alginate Ag 4. Secondary Dressing Dry Gauze Notes netting Electronic Signature(s) Signed: 10/17/2019 4:37:08 PM By: Mikeal Hawthorne EMT/HBOT Signed: 10/17/2019 5:39:51 PM By: Deon Pilling Entered By: Mikeal Hawthorne on 10/17/2019 14:34:12 -------------------------------------------------------------------------------- Wound Assessment Details Patient Name: Date of  Service: ETHYN, SCHETTER. 10/17/2019 8:15 AM Medical Record [id number] Patient Account Number: [id number] Date of Birth/Sex: Treating RN: 05-Jun-1941 (79 y.o. [name] Primary Care Aibhlinn Kalmar: Hurshel Party Other Clinician: Referring Seymour Pavlak: Treating Brodie Correll/Extender:Robson, Cheryl Flash, [place] Weeks in Treatment: 33 Wound Status Wound Number: 22 Primary Diabetic Wound/Ulcer of the Lower Extremity Etiology: Wound Location: Right Toe Third Wound Open Wounding Event: Gradually Appeared Status: Date Acquired: 09/23/2019 Comorbid Cataracts, Hypertension, Peripheral Venous Weeks Of Treatment: 3 History: Disease, Type II Diabetes, Gout, Received Clustered Wound: Yes Radiation Photos Wound Measurements Length: (cm) 1 Width: (cm) 0.4 Depth: (cm) 0.2 Clustered Quantity: 2 Area: (cm) 0.314 Volume: (cm) 0.063 Wound Description Classification: Grade 2 Wound Margin: Flat and Intact Exudate Amount: Small Exudate Type: Serosanguineous Exudate Color: red, brown Wound Bed Granulation Amount: Medium (34-66%) Granulation Quality: Red, Pink Necrotic Amount: Medium (34-66%) Necrotic Quality: Adherent Slough Foul Odor After Cleansing: N Slough/Fibrino Y Exposed Structure Fascia Exposed: Fat Layer (Subcutaneous Tissue) Exposed: Tendon Exposed: Muscle Exposed: Joint Exposed: Bone Exposed: % Reduction in Area: 73.3% % Reduction in Volume: 46.6% Epithelialization: Small (1-33%) Tunneling: No Undermining: No o es No Yes No No No No Treatment Notes Wound #22 (Right Toe Third) 1. Cleanse With Wound Cleanser Soap and water 3. Primary Dressing Applied Calcium Alginate Ag 4. Secondary Dressing Dry Gauze Notes netting Electronic Signature(s) Signed: 10/17/2019 4:37:08 PM By: Mikeal Hawthorne EMT/HBOT Signed: 10/17/2019 5:39:51 PM By: Deon Pilling Entered By: Mikeal Hawthorne on 10/17/2019  14:36:27 -------------------------------------------------------------------------------- Wound Assessment Details Patient Name: Date of Service: NYAIR, DEPAULO. 10/17/2019 8:15 AM Medical Record [id number] Patient Account Number: [id number] Date of Birth/Sex: Treating RN: 1941/01/21 (  79 y.o. [name] Primary Care Carmel Waddington: Hurshel Party Other Clinician: Referring Brandolyn Shortridge: Treating Tameem Pullara/Extender:Robson, Cheryl Flash, [place] Weeks in Treatment: 33 Wound Status Wound Number: 24 Primary Venous Leg Ulcer Etiology: Wound Location: Left Lower Leg - Anterior, Distal Secondary Diabetic Wound/Ulcer of the Lower Extremity Wounding Event: Gradually Appeared Etiology: Date Acquired: 10/04/2019 Wound Open Weeks Of Treatment: 1 Status: Clustered Wound: No Comorbid Cataracts, Hypertension, Peripheral Venous History: Disease, Type II Diabetes, Gout, Received Radiation Photos Wound Measurements Length: (cm) 5.5 % Reduct Width: (cm) 2 % Reduct Depth: (cm) 0.2 Epitheli Area: (cm) 8.639 Tunneli Volume: (cm) 1.728 Undermi Wound Description Full Thickness Without Exposed Support Foul Odo Classification: Structures Slough/F Wound Flat and Intact Margin: Exudate Medium Amount: Exudate Serosanguineous Type: Exudate red, brown Color: Wound Bed Granulation Amount: Medium (34-66%) Granulation Quality: Red Fascia E Necrotic Amount: Medium (34-66%) Fat Laye Necrotic Quality: Adherent Slough Tendon E Muscle E Joint Ex Bone Exp r After Cleansing: No ibrino Yes Exposed Structure xposed: No r (Subcutaneous Tissue) Exposed: Yes xposed: No xposed: No posed: No osed: No ion in Area: -35.5% ion in Volume: -170.8% alization: Small (1-33%) ng: No ning: No Treatment Notes Wound #24 (Left, Distal, Anterior Lower Leg) 1. Cleanse With Wound Cleanser Soap and water 2. Periwound Care TCA Cream 3. Primary Dressing Applied Iodoflex 4. Secondary Dressing Dry  Gauze 6. Support Layer Applied Kerlix/Coban Product manager) Signed: 10/17/2019 4:37:08 PM By: Mikeal Hawthorne EMT/HBOT Signed: 10/17/2019 5:39:51 PM By: Deon Pilling Entered By: Mikeal Hawthorne on 10/17/2019 14:55:35 -------------------------------------------------------------------------------- Wound Assessment Details Patient Name: Date of Service: SHAYON, TROMPETER 10/17/2019 8:15 AM Medical Record [id number] Patient Account Number: [id number] Date of Birth/Sex: Treating RN: 06/05/1941 (79 y.o. [name] Primary Care Akari Crysler: Hurshel Party Other Clinician: Referring Foye Damron: Treating Leba Tibbitts/Extender:Robson, Cheryl Flash, [place] Weeks in Treatment: 33 Wound Status Wound Number: 25 Primary Venous Leg Ulcer Etiology: Wound Location: Left Lower Leg - Posterior, Distal Secondary Diabetic Wound/Ulcer of the Lower Extremity Wounding Event: Gradually Appeared Etiology: Date Acquired: 10/04/2019 Wound Open Weeks Of Treatment: 1 Status: Clustered Wound: Yes Comorbid Cataracts, Hypertension, Peripheral Venous History: Disease, Type II Diabetes, Gout, Received Radiation Photos Wound Measurements Length: (cm) 6.5 % Reduct Width: (cm) 3 % Reduct Depth: (cm) 0.1 Epitheli Clustered Quantity: 3 Tunnelin Area: (cm) 15.315 Undermi Volume: (cm) 1.532 Wound Description Full Thickness Without Exposed Support Foul Odo Classification: Structures Slough/F Wound Flat and Intact Margin: Exudate Medium Amount: Exudate Serosanguineous Type: Exudate red, brown Color: Wound Bed Granulation Amount: Medium (34-66%) Granulation Quality: Red, Pink Fascia Exp Necrotic Amount: Medium (34-66%) Fat Layer Necrotic Quality: Adherent Slough Tendon Exp Muscle Exp Joint Expo Bone Expos r After Cleansing: No ibrino Yes Exposed Structure osed: No (Subcutaneous Tissue) Exposed: Yes osed: No osed: No sed: No ed: No ion in Area: 38.1% ion in Volume:  38.1% alization: Medium (34-66%) g: No ning: No Treatment Notes Wound #25 (Left, Distal, Posterior Lower Leg) 1. Cleanse With Wound Cleanser Soap and water 2. Periwound Care TCA Cream 3. Primary Dressing Applied Iodoflex 4. Secondary Dressing Dry Gauze 6. Support Layer Applied Kerlix/Coban Product manager) Signed: 10/17/2019 4:37:08 PM By: Mikeal Hawthorne EMT/HBOT Signed: 10/17/2019 5:39:51 PM By: Deon Pilling Entered By: Mikeal Hawthorne on 10/17/2019 14:53:29 -------------------------------------------------------------------------------- Wound Assessment Details Patient Name: Date of Service: JOSUHA, FONTANEZ 10/17/2019 8:15 AM Medical Record [id number] Patient Account Number: [id number] Date of Birth/Sex: Treating RN: Jul 20, 1941 (79 y.o. [name] Primary Care [name] Mcgreal: Hurshel Party Other Clinician: Referring Torrence Branagan: Treating Kamayah Pillay/Extender:Robson, Legrand Como  GOSRANI, NIMISH Weeks in Treatment: 33 Wound Status Wound Number: 26 Primary Venous Leg Ulcer Etiology: Wound Location: Left Lower Leg - Medial Secondary Diabetic Wound/Ulcer of the Lower Extremity Wounding Event: Gradually Appeared Etiology: Date Acquired: 10/04/2019 Wound Open Weeks Of Treatment: 1 Status: Clustered Wound: No Comorbid Cataracts, Hypertension, Peripheral Venous History: Disease, Type II Diabetes, Gout, Received Radiation Photos Wound Measurements Length: (cm) 1.7 % Reduct Width: (cm) 0.8 % Reduct Depth: (cm) 0.1 Epitheli Area: (cm) 1.068 Tunneli Volume: (cm) 0.107 Undermi Wound Description Classification: Full Thickness Without Exposed Support Foul Odo Structures Slough/F Wound Flat and Intact Margin: Exudate Small Amount: Exudate Serosanguineous Type: Exudate red, brown Color: Wound Bed Granulation Amount: Large (67-100%) Granulation Quality: Red Fascia E Necrotic Amount: Small (1-33%) Fat Laye Necrotic Quality: Adherent Slough Tendon  E Muscle E Joint Ex Bone Exp r After Cleansing: No ibrino Yes Exposed Structure xposed: No r (Subcutaneous Tissue) Exposed: Yes xposed: No xposed: No posed: No osed: No ion in Area: -14.2% ion in Volume: -15.1% alization: Small (1-33%) ng: No ning: No Treatment Notes Wound #26 (Left, Medial Lower Leg) 1. Cleanse With Wound Cleanser Soap and water 2. Periwound Care TCA Cream 3. Primary Dressing Applied Iodoflex 4. Secondary Dressing Dry Gauze 6. Support Layer Applied Kerlix/Coban Product manager) Signed: 10/17/2019 4:37:08 PM By: Mikeal Hawthorne EMT/HBOT Signed: 10/17/2019 5:39:51 PM By: Deon Pilling Entered By: Mikeal Hawthorne on 10/17/2019 14:40:59 -------------------------------------------------------------------------------- Wound Assessment Details Patient Name: Date of Service: KAYE, MITRO 10/17/2019 8:15 AM Medical Record [id number] Patient Account Number: [id number] Date of Birth/Sex: Treating RN: Feb 14, 1941 (79 y.o. [name] Primary Care Tariyah Pendry: Hurshel Party Other Clinician: Referring Julissa Browning: Treating Luie Laneve/Extender:Robson, Cheryl Flash, [place] Weeks in Treatment: 33 Wound Status Wound Number: 27 Primary Venous Leg Ulcer Etiology: Wound Location: Left Lower Leg - Lateral, Proximal Secondary Diabetic Wound/Ulcer of the Lower Extremity Wounding Event: Gradually Appeared Etiology: Date Acquired: 10/04/2019 Wound Open Weeks Of Treatment: 1 Status: Clustered Wound: No Comorbid Cataracts, Hypertension, Peripheral Venous History: Disease, Type II Diabetes, Gout, Received Radiation Photos Wound Measurements Length: (cm) 0.1 % Reduct Width: (cm) 0.1 % Reduct Depth: (cm) 0.1 Epitheli Area: (cm) 0.008 Tunneli Volume: (cm) 0.001 Undermi Wound Description Classification: Full Thickness Without Exposed Support Foul Odo Structures Slough/F Wound Flat and Intact Margin: Exudate None  Present Amount: Wound Bed Granulation Amount: None Present (0%) Necrotic Amount: None Present (0%) Fascia E Fat Laye Tendon E Muscle E Joint Ex Bone Expos r After Cleansing: No ibrino Yes Exposed Structure xposed: No r (Subcutaneous Tissue) Exposed: Yes xposed: No xposed: No posed: No ed: No ion in Area: 99.9% ion in Volume: 99.9% alization: None ng: No ning: No Assessment Notes fibrin/scabbed over area. Treatment Notes Wound #27 (Left, Proximal, Lateral Lower Leg) 1. Cleanse With Wound Cleanser Soap and water 2. Periwound Care TCA Cream 3. Primary Dressing Applied Iodoflex 4. Secondary Dressing Dry Gauze 6. Support Layer Applied Kerlix/Coban Product manager) Signed: 10/17/2019 4:37:08 PM By: Mikeal Hawthorne EMT/HBOT Signed: 10/17/2019 5:39:51 PM By: Deon Pilling Entered By: Mikeal Hawthorne on 10/17/2019 14:57:55 -------------------------------------------------------------------------------- Wound Assessment Details Patient Name: Date of Service: KAEVION, SINCLAIR 10/17/2019 8:15 AM Medical Record [id number] Patient Account Number: [id number] Date of Birth/Sex: Treating RN: [birth year] (79 y.o. [name] Primary Care Isabel Ardila: Hurshel Party Other Clinician: Referring Armenta Erskin: Treating Chelsa Stout/Extender:Robson, Cheryl Flash, [place] Weeks in Treatment: 33 Wound Status Wound Number: [id number] Primary Diabetic Wound/Ulcer of the Lower Extremity Etiology: Wound Location: Right Calf - Posterior Wound Open Wounding Event:  Gradually Appeared Status: Date Acquired: 02/28/2019 Comorbid Cataracts, Hypertension, Peripheral Venous Weeks Of Treatment: 33 History: Disease, Type II Diabetes, Gout, Received Clustered Wound: No Radiation Photos Wound Measurements Length: (cm) 1.8 % Reductio Width: (cm) 1.2 % Reductio Depth: (cm) 0.1 Epithelial Area: (cm) 1.696 Tunneling Volume: (cm) 0.17 Undermini Wound Description Classification:  Grade 2 Wound Margin: Distinct, outline attached Exudate Amount: Medium Exudate Type: Serosanguineous Exudate Color: red, brown Wound Bed Granulation Amount: Large (67-100%) Granulation Quality: Red Necrotic Amount: None Present (0%) Foul Odor After Cleansing: No Slough/Fibrino No Exposed Structure Fascia Exposed: No Fat Layer (Subcutaneous Tissue) Exposed: Yes Tendon Exposed: No Muscle Exposed: No Joint Exposed: No Bone Exposed: No n in Area: 93.1% n in Volume: 93.1% ization: Medium (34-66%) : No ng: No Treatment Notes Wound #3R (Right, Posterior Calf) 1. Cleanse With Wound Cleanser Soap and water 2. Periwound Care TCA Cream 3. Primary Dressing Applied Iodoflex 4. Secondary Dressing Dry Gauze 6. Support Layer Applied Kerlix/[place] Product manager) Signed: 10/17/2019 4:37:08 PM By: Mikeal Hawthorne EMT/HBOT Signed: 10/17/2019 5:39:51 PM By: Deon Pilling Entered By: Mikeal Hawthorne on 10/17/2019 14:33:24 -------------------------------------------------------------------------------- Vitals Details Patient Name: Date of Service: Daniel Pia D. 10/17/2019 8:15 AM Medical Record [id number] Patient Account Number: [id number] Date of Birth/Sex: Treating RN: 05/24/1941 (79 y.o. [name] Primary Care Enyla [place]: [place] Party Other Clinician: Referring Aarianna Hoadley: Treating Adelise Buswell/Extender:Robson, Cheryl Flash, [place] Weeks in Treatment: 77 Vital Signs Time Taken: 08:20 Temperature (F): 98 Height (in): 74 Pulse (bpm): 63 Weight (lbs): 212 Respiratory Rate (breaths/min): 20 Body Mass Index (BMI): 27.2 Blood Pressure (mmHg): 110/70 Reference Range: 80 - 120 mg / dl Electronic Signature(s) Signed: 10/17/2019 5:39:51 PM By: Deon Pilling Entered By: Deon Pilling on 10/17/2019 08:20:23

## 2019-10-18 NOTE — Telephone Encounter (Signed)
New  Message:      [facility]

## 2019-10-20 ENCOUNTER — Telehealth: Payer: Self-pay

## 2019-10-20 NOTE — Telephone Encounter (Signed)
Good afternoon, I'm doing a prior auth on the [place] for this pt. I got a warning flag b/c the pt is listed as still talking lisinopril too. Is this correct or would you like to stop the Lisinopril. Please advise  Yes, please ask him to stop the lisinopril for 48 hr before starting the Entresto Per Rhonda Barrett PA-C

## 2019-10-20 NOTE — Telephone Encounter (Signed)
**Note De-Identified [employer] [place] Obfuscation** We received an [place] PA request fax from [phone] for this pt. He sees Dr Bronson Ing in our [place] office.  Key: [id number] Olin Pia DOB: [birth year]  Forwarding this note to our [place] office.

## 2019-10-20 NOTE — Telephone Encounter (Signed)
Pt and wife called and notified. Wife requesting 30 day free trial card. Card placed at front desk for pick up.

## 2019-10-23 NOTE — Progress Notes (Addendum)
error 

## 2019-10-24 ENCOUNTER — Other Ambulatory Visit (HOSPITAL_COMMUNITY)
Admission: RE | Admit: 2019-10-24 | Discharge: 2019-10-24 | Disposition: A | Discharge status: Home / Self Care | Payer: Medicare HMO | Source: Other Acute Inpatient Hospital | Attending: Internal Medicine | Admitting: Internal Medicine

## 2019-10-24 ENCOUNTER — Other Ambulatory Visit: Payer: Self-pay

## 2019-10-24 ENCOUNTER — Telehealth: Payer: Self-pay

## 2019-10-24 ENCOUNTER — Encounter (HOSPITAL_BASED_OUTPATIENT_CLINIC_OR_DEPARTMENT_OTHER): Payer: Medicare HMO | Admitting: Internal Medicine

## 2019-10-24 DIAGNOSIS — E1142 Type 2 diabetes mellitus with diabetic polyneuropathy: Secondary | ICD-10-CM | POA: Diagnosis not present

## 2019-10-24 DIAGNOSIS — I87313 Chronic venous hypertension (idiopathic) with ulcer of bilateral lower extremity: Secondary | ICD-10-CM | POA: Diagnosis not present

## 2019-10-24 DIAGNOSIS — L97524 Non-pressure chronic ulcer of other part of left foot with necrosis of bone: Secondary | ICD-10-CM | POA: Insufficient documentation

## 2019-10-24 DIAGNOSIS — L97211 Non-pressure chronic ulcer of right calf limited to breakdown of skin: Secondary | ICD-10-CM | POA: Diagnosis not present

## 2019-10-24 DIAGNOSIS — L97511 Non-pressure chronic ulcer of other part of right foot limited to breakdown of skin: Secondary | ICD-10-CM | POA: Diagnosis not present

## 2019-10-24 DIAGNOSIS — L97822 Non-pressure chronic ulcer of other part of left lower leg with fat layer exposed: Secondary | ICD-10-CM | POA: Diagnosis not present

## 2019-10-24 DIAGNOSIS — E1151 Type 2 diabetes mellitus with diabetic peripheral angiopathy without gangrene: Secondary | ICD-10-CM | POA: Diagnosis not present

## 2019-10-24 DIAGNOSIS — E11621 Type 2 diabetes mellitus with foot ulcer: Secondary | ICD-10-CM | POA: Diagnosis not present

## 2019-10-24 DIAGNOSIS — L97526 Non-pressure chronic ulcer of other part of left foot with bone involvement without evidence of necrosis: Secondary | ICD-10-CM | POA: Diagnosis not present

## 2019-10-24 DIAGNOSIS — L03116 Cellulitis of left lower limb: Secondary | ICD-10-CM | POA: Diagnosis not present

## 2019-10-24 DIAGNOSIS — L97221 Non-pressure chronic ulcer of left calf limited to breakdown of skin: Secondary | ICD-10-CM | POA: Diagnosis not present

## 2019-10-24 DIAGNOSIS — I890 Lymphedema, not elsewhere classified: Secondary | ICD-10-CM | POA: Diagnosis not present

## 2019-10-24 NOTE — Telephone Encounter (Signed)
10-24-19/2:32pm. Rec'd call from Mrs.Vicuna stating Pt started entresto today. She wants to know how long before having an office visit. I have informed Mrs.Joya Gaskins of scheduled appts.  However Mrs. Mizelle is questioning if those appt dates are soon enough.  Please call [phone] Thanks renee

## 2019-10-25 NOTE — Progress Notes (Signed)
KORREY, SCHLEICHER ([id number]) Visit Report for 10/24/2019 Debridement Details Patient Name: Date of Service: [name], [name] 10/24/2019 8:45 AM Medical Record [place] Patient Account Number: [id number] Date of Birth/Sex: Treating RN: September 10, 1941 (79 y.o. M) Primary Care Provider: Hurshel Party Other Clinician: Referring Provider: Treating Provider/Extender:Arayna Illescas, Cheryl Flash, [place] Weeks in Treatment: 34 Debridement Performed for Wound #13 Left Toe Second Assessment: Performed By: Physician Ricard Dillon., MD Debridement Type: Debridement Severity of Tissue Pre Fat layer exposed Debridement: Level of Consciousness (Pre- Awake and Alert procedure): Pre-procedure Verification/Time Out Taken: Yes - 09:48 Start Time: 09:48 Pain Control: Lidocaine Injectable : 1 Total Area Debrided (L x W): 2.4 (cm) x 2.4 (cm) = 5.76 (cm) Tissue and other material Viable, Non-Viable, Bone, Slough, Subcutaneous, Skin: Dermis , Skin: Epidermis, debrided: Slough Level: Skin/Subcutaneous Tissue/Muscle/Bone Debridement Description: Excisional Instrument: Rongeur Specimen: Tissue Culture Number of Specimens Taken: 1 Bleeding: Minimum Hemostasis Achieved: Pressure End Time: 09:58 Procedural Pain: 3 Post Procedural Pain: 5 Response to Treatment: Procedure was tolerated well Level of Consciousness Awake and Alert (Post-procedure): Post Debridement Measurements of Total Wound Length: (cm) 2.4 Width: (cm) 2.4 Depth: (cm) 0.1 Volume: (cm) 0.452 Character of Wound/Ulcer Post Improved Debridement: Severity of Tissue Post Debridement: Fat layer exposed Post Procedure Diagnosis Same as Pre-procedure Electronic Signature(s) Signed: 10/24/2019 5:32:11 PM By: [name] MD Entered By: [name] on 10/24/2019 10:02:43 -------------------------------------------------------------------------------- Debridement Details Patient Name: Date of Service: Daniel Pia D.  10/24/2019 8:45 AM Medical Record [id number] Patient Account Number: [id number] Date of Birth/Sex: Treating RN: Oct 01, 1940 (79 y.o. M) Primary Care Provider: Hurshel Party Other Clinician: Referring Provider: Treating Provider/Extender:Pansey Pinheiro, Cheryl Flash, [place] Weeks in Treatment: 34 Debridement Performed for Wound #19 Left,Anterior Lower Leg Assessment: Performed By: Physician Ricard Dillon., MD Debridement Type: Debridement Severity of Tissue Pre Fat layer exposed Debridement: Level of Consciousness (Pre- Awake and Alert procedure): Pre-procedure Verification/Time Out Taken: Yes - 09:48 Start Time: 09:48 Pain Control: Lidocaine 5% topical ointment Total Area Debrided (L x W): 2.9 (cm) x 1.9 (cm) = 5.51 (cm) Tissue and other material Viable, Non-Viable, Slough, Subcutaneous, Skin: Dermis , Skin: Epidermis, Slough debrided: Level: Skin/Subcutaneous Tissue Debridement Description: Excisional Instrument: Curette Bleeding: Minimum Hemostasis Achieved: Pressure End Time: 09:53 Procedural Pain: 3 Post Procedural Pain: 5 Response to Treatment: Procedure was tolerated well Level of Consciousness Awake and Alert (Post-procedure): Post Debridement Measurements of Total Wound Length: (cm) 2.9 Width: (cm) 1.9 Depth: (cm) 0.1 Volume: (cm) 0.433 Character of Wound/Ulcer Post Improved Debridement: Severity of Tissue Post Debridement: Fat layer exposed Post Procedure Diagnosis Same as Pre-procedure Electronic Signature(s) Signed: 10/24/2019 5:32:11 PM By: [name] MD Entered By: [name] on 10/24/2019 10:02:53 -------------------------------------------------------------------------------- HPI Details Patient Name: Date of Service: Daniel Pia D. 10/24/2019 8:45 AM Medical Record [id number] Patient Account Number: [id number] Date of Birth/Sex: Treating RN: 01/13/1941 (79 y.o. M) Primary Care Provider: Hurshel Party Other  Clinician: Referring Provider: Treating Provider/Extender:Katessa Attridge, Cheryl Flash, [place] Weeks in Treatment: 87 History of Present Illness Location: Patient presents with a wound to left lower leg. Quality: Patient reports No Pain. Duration: 2 months HPI Description: no cig or alcohol. spontaneous appearance in area of stasis dermamtitis. [name] on metformin only. chronic afib on Coumadin. diabetes and coag studies not good. hba1c 7.5. ivr 4.5. no pain or sxs of systemic disease. hx chf. no intermittent claudication 02/28/2019 Readmission This is a now a 79 year old man who was previously cared for in 2016 by Dr. Lindon Romp for wounds on his lower extremities. At that point he  had venous reflux studies although I cannot seem to open these in [facility]. He had arterial studies showing an ABI of 1.11 on the right and 1.27 on the left his waveforms were triphasic bilaterally. He was discharged in stockings although I do not believe he is wearing these in some time. He tells me that about a month ago he noted openings of a large wound on the posterior right calf and 2 smaller areas on the left lateral calf and a small area more recently on the left posterior calf. He has been dressing these with peroxide and triple antibiotic ointment. He is not wearing compression. Past medical history; type 2 diabetes with peripheral neuropathy, chronic venous insufficiency, hypertension, cardiomyopathy, chronic atrial fibrillation on Coumadin, prostate cancer, hyperlipidemia, gout, ABI in our clinic was 1.34 on the left and not obtainable on the right 6/9; this is a patient who has chronic venous insufficiency. He has a fairly substantial area on the right posterior calf, left lateral calf and a small area on the left posterior calf. On arrival last week he had very palpable popliteal and femoral pulses but nothing in his bilateral feet. Unfortunately we cannot get arterial studies until July 1 at Dr.  Kennon Holter office. They live in [place]. We use silver alginate under Kerlix Coban 6/16; patient with chronic venous insufficiency with wounds on his bilateral lower extremities. When he came into our clinic he was discovered to have a complete absence of peripheral pedal pulses at either the dorsalis pedis or posterior tibial. He does have easily palpable femoral and popliteal pulses. He sees Dr. [name] tomorrow for noninvasive arterial tests. He may also require venous reflux evaluation although I do not view this as an urgent thing. We have been using silver alginate. His wound surfaces of cleaned up quite nicely 6/23; patient with chronic venous insufficiency with wounds on his bilateral lower extremities. His wounds all are somewhat better looking. He did go to Dr. Kennon Holter office but somehow ended up on the doctors schedule rather than being scheduled for noninvasive tests therefore his noninvasive tests are scheduled for July 1. We agree that he has venous insufficiency ulcers but I cannot feel any pulses in his lower extremities dictating the need for test. We are only using Kerlix and light Coban unfortunately this appears to be holding the edema 6/30; has his arterial studies tomorrow. We have been using Kerlix and light Coban will go to a more aggressive compression if the arterial studies will allow. We all agreed these are venous wounds however I cannot feel pulses at either the dorsalis pedis or posterior tibial bilaterally. His wounds generally look some better including left lateral and right posterior. 7/7-Patient returns at 1 week in Kerlix/Coban to both legs, with improvement, in the left lateral and right posterior lower leg wounds, ABI's are normal in both legs per vascular studies, TBI is also normal on both sides, we are using hydrofera blue to the wounds 7/14; patient's arterial studies from 2 weeks ago showed an ABI on the right at 1.03 with a TBI of 0.86. On the left the  ABI was 1.06 with a TBI of 0.84. Notable for the fact that his arterial waveforms were monophasic in all of the lower extremity arteries suggesting some degree of arterial occlusive disease but in general this was felt to be fairly adequate for healing. His compression was increased from 2-3 layer which is appropriate. Dressing was changed to [facility] 7/21; patient's wounds are measuring smaller.  The more substantial one on the right posterior calf, second 1 on the left lateral calf. Using [facility] on both wound areas 7/28; patient continues to make nice improvements. The area on the right posterior calf is smaller. Area on the left lateral calf also is smaller. We have been using Hydrofera Blue under compression. The patient will need compression stockings and we have measured him for these in the eventuality that these heal which really should not be too long from now 8/4-Patient continues to make improvement, the right posterior calf area smaller with rim of keratotic skin on one side, the left wound is definitely smaller and improving. 8/11-Returns at 1 week, after being in 3 layer compression on both legs, both wounds appear to be improving, making good progress, patient is happy, pain is also less especially in the right leg wound 8/18; the area on the left anterior lower leg is healed. On the right posterior leg the wound remains although the dimensions are a lot better. 8/25; he arrives in clinic today with a large body of open wound on the left lateral calf. All of the 3 wounds in this area are in close juxtaposition to each other. The story is that we discharged him last week with no a wrap on the left leg. They went to [place] could not get in as they are only excepting phone orders or online orders for stockings hence they did not put any stocking on the left leg all week. They have something at home but the patient with that was either incapable or just did not put them  on. Apparently these opened 1 morning after getting out of bed. The area on the right has no real change 9/1; patient has bilateral lower extremity wounds in the setting of severe chronic venous insufficiency and secondary lymphedema. He arrived last week with new areas on the left lateral lower leg after we did not wrap him and he did not use his stockings. Nevertheless the areas on the left look better today under compression. Posterior right calf does not really changed. We are using Hydrofera Blue on both areas under compression 9/15; bilateral lower extremity wounds in the setting of severe chronic venous insufficiency and secondary lymphedema. He has 20 to 30 mmHg below-knee compression stockings under the eventuality that these close over. We did get the left leg to close but he did not transition to a stocking and this reopened. There are 2 open areas on the left posterior lateral calf and one on the right. Both of these look satisfactory. Using [employer] 9/22; bilateral lower extremity wounds in the setting of chronic venous insufficiency. 2 superficial areas on the left lateral calf. One on the right just above the Achilles area. We have good edema control we have been using Hydrofera Blue 9/29; the areas on the left lateral calf are healed. On the right just above the Achilles and tendon area things look a lot better small wounds one scabbed area. We have been using Hydrofera Blue. We can discharge him in his own stocking on the left still wrapping on the right. This is the second time we have healed the left leg but he did not put a stocking on last time. Hopefully this will maintain the edema from chronic venous disease with secondary lymphedema 10/6; he comes in today having a stocking on the left leg. They had trouble getting it on there is a lot of increase in swelling 2 small open areas one anteriorly  and one on the medial calf. They report a lot of difficulty getting  the stocking on. Paradoxically the area on the right that we have been wrapping posteriorly is closed 10/13; he comes in today with wounds bilaterally including superficial areas on the left medial and left lateral calf. As well as the right posterior has reopened in the Achilles area superiorly. He still does not have his juxta lite stockings although truthfully we would not of been able to use them today anyway. Apparently have been ordered and paid for from [employer] although they have not been delivered 10/20; his area on the right is just the boat closed on the right posterior. Still has the area on the left lateral and a very tiny area on the left medial. He has his bilateral juxta lites although he is not ready for them this week. He tolerated the increase to 4 layer compression last week quite well 10/27; the area on the right posterior calf is once again closed. He has a superficial area on the left lateral calf that is still open. He has been using Hydrofera Blue and bilateral 4-layer compression. He can change to his own juxta lite stocking on the right and we are instructing him today 11/3; the area on the right posterior calf reopened according to the patient and his wife after they took off the stocking when they got home last week. Apparently scabbed over there is now a fairly substantial wound which looks pretty much the same. Our intake nurse noted that they were using the juxta lite stockings appropriately. I was really hoping I might be able to close him out today. He has 1 very tiny remaining area on the left lateral lower leg. 11/10; right posterior calf wound measures smaller but is still open. We have been using Hydrofera Blue. On the left he has a small oval-shaped wound and he seems to have had another wound distally that is open and likely a blister. We are using Hydrofera Blue under compression 11/17; right posterior calf wound continues to get better. We have been using  Hydrofera Blue. On the left lateral one of the wounds has closed still a small open area. We have been using Hydrofera Blue on this as well. Both areas have been under 4-layer compression Arrives in clinic today with some swelling in the dorsal foot on the right some erythema of his forefoot and toes. Initially when I looked at this I almost thought this was a sunburn distal to a wrap injury. 12/1; right posterior calf wound debrided with a curette. We have been using Hydrofera Blue on the left anterior lateral he has an area across the mid tibia. Finally a small area on the left lateral lower calf. Finally he continues to have de-epithelialized areas on the dorsal aspect of his toes. Initially thought this might be a burn injury when I saw him 2 weeks ago. I now wonder about tinea. I have also reviewed his arterial studies which were really quite good in July/20 with normal TBI's and ABIs but monophasic waveforms 12/8; comes in today with worsening problems especially on the left leg where he now has a cluster of wounds in the left anterior mid tibia. Very poor edema control. I reduced him to 3 layer from 4 layer compression last week because of some concern about blood flow to his toes however he does not have good edema control on the left leg. Right leg edema control looks satisfactory. On the left he  has a cluster of wounds anteriorly, small area on the left medial fifth met head and then the collection of areas on his toes which appear better On the right he has the original area on the right posterior calf, a new area right medially. His formal arterial studies from mid July are noted below. He was evaluated by Dr.Berry ABI Findings: +---------+------------------+-----+----------+--------+ Right Rt Pressure (mmHg)IndexWaveform Comment  +---------+------------------+-----+----------+--------+ Brachial 176     +---------+------------------+-----+----------+--------+ ATA 176  0.99 monophasic  +---------+------------------+-----+----------+--------+ PTA 183 1.03 monophasic  +---------+------------------+-----+----------+--------+ PERO 172 0.97 monophasic  +---------+------------------+-----+----------+--------+ Great Toe153 0.86    +---------+------------------+-----+----------+--------+ +---------+------------------+-----+-----------+-------+ Left Lt Pressure (mmHg)IndexWaveform Comment +---------+------------------+-----+-----------+-------+ Brachial 178     +---------+------------------+-----+-----------+-------+ ATA 162 0.91 multiphasic  +---------+------------------+-----+-----------+-------+ PTA 188 1.06 multiphasic  +---------+------------------+-----+-----------+-------+ PERO 158 0.89 monophasic   +---------+------------------+-----+-----------+-------+ Great Toe150 0.84    +---------+------------------+-----+-----------+-------+ +-------+-----------+-----------+------------+------------+ ABI/TBIToday's ABIToday's TBIPrevious ABIPrevious TBI +-------+-----------+-----------+------------+------------+ Right 1.03 0.86 1.11   +-------+-----------+-----------+------------+------------+ Left 1.06 0.84 1.27   +-------+-----------+-----------+------------+------------+ Tibial waveforms somewhat difficult to record due to irregular heartbeat. Bilateral ABIs appear essentially unchanged compared to prior study on 06/21/15. Summary: Right: Resting right ankle-brachial index is within normal range. No evidence of significant right lower extremity arterial disease. The right toe-brachial index is normal. Although ankle brachial indices are within normal limits (0.95-1.29), arterial Doppler waveforms at the ankle suggest some component of arterial occlusive disease. Left: Resting left ankle-brachial index is within normal range. No evidence of significant left lower extremity  arterial disease. The left toe-brachial index is normal. Although ankle brachial indices are within normal limits (0.95-1.29), arterial Doppler waveforms at the ankle suggest some component of arterial occlusive disease. 12/15; the patient's area on the left mid tibia looks better. Right posterior calf also better. He has the area on the left foot as well. All of his toes look better I think this was tinea. We are using Hydrofera Blue everywhere else The patient was in urgent care yesterday with wheezing and shortness of breath. He got azithromycin and prednisone. He feels better. His lungs are currently clear to auscultation. He was not tested for Covid 19 12/29; the patient arrives in clinic today with quite a bit change. 2 weeks ago he only had areas on the left mid tibia right posterior calf with tinea pedis resolving between his toes. He arrives in clinic today with several areas on the dorsal toes on the right, dorsal left second toe. He has skin breakdown in the left medial calf probably from excess edema. Small area proximally in the medial calf. He has weeping edema fluid coming out of the skin excoriations on the left medial calf. He tells me that he is having a cardiac catheterization next week. I had a quick look at [facility] health link. He was found to have an ejection fraction of 25% during the work-up for persistent atrial fibrillation. He saw his cardiology office yesterday seen by the nurse practitioner. She increased his carvedilol. He has not been on diuretics for apparently several months and indeed in the nurse practitioner Dietrich Pates notes she had knowledge of this. 10/04/2019 on evaluation today patient presents as a walk-in visit concerning the fact that he did not have an appointment here for our clinic at this point. He actually had a cardiac catheterization earlier today and then came from there to here in order to be evaluated. With that being said unfortunately he is  having significant cellulitis of his left lower extremity upon evaluation today this appears to have deteriorated even since last week's evaluation  with Dr. Dellia Nims. The right lower extremity is actually doing okay I really see no evidence of deterioration at this point at those locations. In fact the right leg seems in general be doing quite well. Nonetheless I am concerned about infection and cellulitis of the left lower extremity and again considering his weakened heart I do not want him to develop into sepsis at all. He is also having some trouble breathing today and I understand according to nursing staff this is always the case to some degree. With that being said the patient unfortunately seems to be in my opinion a little bit worse even his wife feels like that may be the case today. Unsure exactly what is leading to this. Cardiac catheterization I did review the report which showed an ejection fraction of 25% he also had an LAD blockage of around 25% based on what I saw. With that being said there was no significant blockages that required stenting at this point he does have weakened cardiac muscles compared to normal. 10/05/2019; patient was seen yesterday in clinic. He was sent to the ER because of cellulitis of the left leg possibility. In the ER he was given 1 dose of IV Levaquin and discharged on Keflex. He came in the clinic initially for a nurse visit to rewrap his left leg. We did not look at the right leg today that is an Haematologist. The patient also had a cardiac cath. According to him there were no blockages but a very low ejection fraction. 1/12; back for an early follow-up. The condition of the left lower leg is a lot better although there are multiple open areas. All of them with not a particularly viable surface. On the right posterior calf he has a single wound with a clean surface. He has a wound with exposed bone on the PIP of the left second toe dorsally. He has wounds on  the dorsal right first second and third toes. His arterial studies were normal. 1/19; the patient has 3 open wounds on the left leg anteriorly in the mid tibia, distally and medially just above the ankle and posteriorly. On the right he has a small area dime sized on the right posterior calf. His edema control is a lot better. He still has wounds on the right second and left second toes. The left second toe has exposed bone. X- ray I did last week did not show evidence of osteomyelitis in the left foot. 1/26; patient with a multiplicity of wounds and problems. On the left he has a circular area on the left anterior mid tibia Left just above the medical ankle -left 2nd toe pip -right 2nd toe right posterior calf Electronic Signature(s) Signed: 10/24/2019 5:32:11 PM By: [name] MD Entered By: [name] on 10/24/2019 10:04:55 -------------------------------------------------------------------------------- Physical Exam Details Patient Name: Date of Service: Daniel Pia D. 10/24/2019 8:45 AM Medical Record [id number] Patient Account Number: [id number] Date of Birth/Sex: Treating RN: 08-16-1941 (79 y.o. M) Primary Care Provider: Hurshel Party Other Clinician: Referring Provider: Treating Provider/Extender:Damesha Lawler, Cheryl Flash, [place] Weeks in Treatment: 74 Constitutional Patient is hypotensive. appears stable. Notes wound exam; debridement of the left anterior leg not vialbe surface extensive bone debridement of left 2nd toe. digital block. spec for C+S Electronic Signature(s) Signed: 10/24/2019 5:32:11 PM By: [name] MD Entered By: [name] on 10/24/2019 10:07:30 -------------------------------------------------------------------------------- Physician Orders Details Patient Name: Date of Service: WILLAIM, MODE 10/24/2019 8:45 AM Medical Record [id number] Patient Account Number: [id number] Date of Birth/Sex:  Treating RN: [birth year]  (78 y.o. [name]) Epps, Morey Hummingbird Primary Care Provider: Hurshel Party Other Clinician: Referring Provider: Treating Provider/Extender:Deniesha Stenglein, Cheryl Flash, [place] Weeks in Treatment: 17 Verbal / Phone Orders: No Diagnosis Coding ICD-10 Coding Code Description E11.51 Type 2 diabetes mellitus with diabetic peripheral angiopathy without gangrene L97.211 Non-pressure chronic ulcer of right calf limited to breakdown of skin E11.42 Type 2 diabetes mellitus with diabetic polyneuropathy L97.221 Non-pressure chronic ulcer of left calf limited to breakdown of skin I87.333 Chronic venous hypertension (idiopathic) with ulcer and inflammation of bilateral lower extremity L97.524 Non-pressure chronic ulcer of other part of left foot with necrosis of bone L97.511 Non-pressure chronic ulcer of other part of right foot limited to breakdown of skin T15.72 Chronic systolic (congestive) heart failure Follow-up Appointments Return Appointment in 1 week. - Tuesday **********EXTRA TIME**************** Dressing Change Frequency Wound #13 Left Toe Second Change dressing every day. Wound #19 Left,Anterior Lower Leg Do not change entire dressing for one week. Wound #20 Right Toe Great Change dressing every day. Wound #21 Right Toe Second Change dressing every day. Wound #22 Right Toe Third Change dressing every day. Wound #24 Left,Distal,Anterior Lower Leg Do not change entire dressing for one week. Wound #25 Left,Distal,Posterior Lower Leg Do not change entire dressing for one week. Wound #26 Left,Medial Lower Leg Do not change entire dressing for one week. Wound #3R Right,Posterior Calf Do not change entire dressing for one week. Skin Barriers/Peri-Wound Care TCA Cream or Ointment Wound Cleansing May shower with protection. Primary Wound Dressing Wound #13 Left Toe Second Calcium Alginate with Silver Wound #19 Left,Anterior Lower Leg Iodoflex Wound #20 Right Toe Great Calcium Alginate with  Silver Wound #21 Right Toe Second Calcium Alginate with Silver Wound #22 Right Toe Third Calcium Alginate with Silver Wound #24 Left,Distal,Anterior Lower Leg Iodoflex Wound #25 Left,Distal,Posterior Lower Leg Iodoflex Wound #26 Left,Medial Lower Leg Iodoflex Wound #3R Right,Posterior Calf Iodoflex Secondary Dressing Dry Gauze - secure toes with tape Edema Control Unna Boots Bilaterally Off-Loading Open toe surgical shoe to: - left and right foot Laboratory Bacteria identified in Unspecified specimen by Anaerobe culture (MICRO) - non healing wound with bone exposed - (ICD10 L97.524 - Non-pressure chronic ulcer of other part of left foot with necrosis of bone) LOINC Code: 635-3 Convenience Name: Anerobic culture Electronic Signature(s) Signed: 10/24/2019 5:32:11 PM By: [name] MD Signed: 10/25/2019 7:19:07 AM By: [name] RN Entered By: [name] on 10/24/2019 10:06:21 -------------------------------------------------------------------------------- Problem List Details Patient Name: Date of Service: Daniel Pia D. 10/24/2019 8:45 AM Medical Record [id number] Patient Account Number: [id number] Date of Birth/Sex: Treating RN: 12/23/1940 (78 y.o. [name]) Dolores Lory, [place] Primary Care Provider: Hurshel Party Other Clinician: Referring Provider: Treating Provider/Extender:Keajah Killough, Cheryl Flash, [place] Weeks in Treatment: 34 Active Problems ICD-10 Evaluated Encounter Code Description Active Date Today Diagnosis E11.51 Type 2 diabetes mellitus with diabetic peripheral 02/28/2019 No Yes angiopathy without gangrene L97.211 Non-pressure chronic ulcer of right calf limited to 02/28/2019 No Yes breakdown of skin E11.42 Type 2 diabetes mellitus with diabetic polyneuropathy 02/28/2019 No Yes L97.221 Non-pressure chronic ulcer of left calf limited to 02/28/2019 No Yes breakdown of skin I87.333 Chronic venous hypertension (idiopathic) with ulcer 02/28/2019 No Yes and  inflammation of bilateral lower extremity L97.524 Non-pressure chronic ulcer of other part of left foot 10/10/2019 No Yes with necrosis of bone L97.511 Non-pressure chronic ulcer of other part of right foot 09/26/2019 No Yes limited to breakdown of skin X64.68 Chronic systolic (congestive) heart failure 10/05/2019 No Yes Inactive Problems ICD-10 Code Description Active  Date Inactive Date B35.3 Tinea pedis 09/05/2019 09/05/2019 L97.521 Non-pressure chronic ulcer of other part of left foot limited to 09/26/2019 09/26/2019 breakdown of skin Resolved Problems Electronic Signature(s) Signed: 10/24/2019 5:32:11 PM By: [name] MD Entered By: [name] on 10/24/2019 10:02:20 -------------------------------------------------------------------------------- Progress Note Details Patient Name: Date of Service: Daniel Pia D. 10/24/2019 8:45 AM Medical Record [id number] Patient Account Number: [id number] Date of Birth/Sex: Treating RN: 03-21-1941 (79 y.o. M) Primary Care Provider: Hurshel Party Other Clinician: Referring Provider: Treating Provider/Extender:Latiffany Harwick, Cheryl Flash, [place] Weeks in Treatment: 34 Subjective History of Present Illness (HPI) The following HPI elements were documented for the patient's wound: Location: Patient presents with a wound to left lower leg. Quality: Patient reports No Pain. Duration: 2 months no cig or alcohol. spontaneous appearance in area of stasis dermamtitis. [name] on metformin only. chronic afib on Coumadin. diabetes and coag studies not good. hba1c 7.5. ivr 4.5. no pain or sxs of systemic disease. hx chf. no intermittent claudication 02/28/2019 Readmission This is a now a 79 year old man who was previously cared for in 2016 by Dr. Lindon Romp for wounds on his lower extremities. At that point he had venous reflux studies although I cannot seem to open these in  link. He had arterial studies showing an ABI of 1.11 on the  right and 1.27 on the left his waveforms were triphasic bilaterally. He was discharged in stockings although I do not believe he is wearing these in some time. He tells me that about a month ago he noted openings of a large wound on the posterior right calf and 2 smaller areas on the left lateral calf and a small area more recently on the left posterior calf. He has been dressing these with peroxide and triple antibiotic ointment. He is not wearing compression. Past medical history; type 2 diabetes with peripheral neuropathy, chronic venous insufficiency, hypertension, cardiomyopathy, chronic atrial fibrillation on Coumadin, prostate cancer, hyperlipidemia, gout, ABI in our clinic was 1.34 on the left and not obtainable on the right 6/9; this is a patient who has chronic venous insufficiency. He has a fairly substantial area on the right posterior calf, left lateral calf and a small area on the left posterior calf. On arrival last week he had very palpable popliteal and femoral pulses but nothing in his bilateral feet. Unfortunately we cannot get arterial studies until July 1 at Dr. Kennon Holter office. They live in [place]. We use silver alginate under Kerlix Coban 6/16; patient with chronic venous insufficiency with wounds on his bilateral lower extremities. When he came into our clinic he was discovered to have a complete absence of peripheral pedal pulses at either the dorsalis pedis or posterior tibial. He does have easily palpable femoral and popliteal pulses. He sees Dr. [name] tomorrow for noninvasive arterial tests. He may also require venous reflux evaluation although I do not view this as an urgent thing. We have been using silver alginate. His wound surfaces of cleaned up quite nicely 6/23; patient with chronic venous insufficiency with wounds on his bilateral lower extremities. His wounds all are somewhat better looking. He did go to Dr. Kennon Holter office but somehow ended up on the doctors  schedule rather than being scheduled for noninvasive tests therefore his noninvasive tests are scheduled for July 1. We agree that he has venous insufficiency ulcers but I cannot feel any pulses in his lower extremities dictating the need for test. We are only using Kerlix and light Coban unfortunately this appears to be holding the edema  6/30; has his arterial studies tomorrow. We have been using Kerlix and light Coban will go to a more aggressive compression if the arterial studies will allow. We all agreed these are venous wounds however I cannot feel pulses at either the dorsalis pedis or posterior tibial bilaterally. His wounds generally look some better including left lateral and right posterior. 7/7-Patient returns at 1 week in Kerlix/Coban to both legs, with improvement, in the left lateral and right posterior lower leg wounds, ABI's are normal in both legs per vascular studies, TBI is also normal on both sides, we are using hydrofera blue to the wounds 7/14; patient's arterial studies from 2 weeks ago showed an ABI on the right at 1.03 with a TBI of 0.86. On the left the ABI was 1.06 with a TBI of 0.84. Notable for the fact that his arterial waveforms were monophasic in all of the lower extremity arteries suggesting some degree of arterial occlusive disease but in general this was felt to be fairly adequate for healing. His compression was increased from 2-3 layer which is appropriate. Dressing was changed to [facility] 7/21; patient's wounds are measuring smaller. The more substantial one on the right posterior calf, second 1 on the left lateral calf. Using [facility] on both wound areas 7/28; patient continues to make nice improvements. The area on the right posterior calf is smaller. Area on the left lateral calf also is smaller. We have been using Hydrofera Blue under compression. The patient will need compression stockings and we have measured him for these in the  eventuality that these heal which really should not be too long from now 8/4-Patient continues to make improvement, the right posterior calf area smaller with rim of keratotic skin on one side, the left wound is definitely smaller and improving. 8/11-Returns at 1 week, after being in 3 layer compression on both legs, both wounds appear to be improving, making good progress, patient is happy, pain is also less especially in the right leg wound 8/18; the area on the left anterior lower leg is healed. On the right posterior leg the wound remains although the dimensions are a lot better. 8/25; he arrives in clinic today with a large body of open wound on the left lateral calf. All of the 3 wounds in this area are in close juxtaposition to each other. The story is that we discharged him last week with no a wrap on the left leg. They went to [facility] could not get in as they are only excepting phone orders or online orders for stockings hence they did not put any stocking on the left leg all week. They have something at home but the patient with that was either incapable or just did not put them on. Apparently these opened 1 morning after getting out of bed. The area on the right has no real change 9/1; patient has bilateral lower extremity wounds in the setting of severe chronic venous insufficiency and secondary lymphedema. He arrived last week with new areas on the left lateral lower leg after we did not wrap him and he did not use his stockings. Nevertheless the areas on the left look better today under compression. Posterior right calf does not really changed. We are using Hydrofera Blue on both areas under compression 9/15; bilateral lower extremity wounds in the setting of severe chronic venous insufficiency and secondary lymphedema. He has 20 to 30 mmHg below-knee compression stockings under the eventuality that these close over. We did get the  left leg to close but he did not transition to a  stocking and this reopened. There are 2 open areas on the left posterior lateral calf and one on the right. Both of these look satisfactory. Using [facility] 9/22; bilateral lower extremity wounds in the setting of chronic venous insufficiency. 2 superficial areas on the left lateral calf. One on the right just above the Achilles area. We have good edema control we have been using Hydrofera Blue 9/29; the areas on the left lateral calf are healed. On the right just above the Achilles and tendon area things look a lot better small wounds one scabbed area. We have been using Hydrofera Blue. We can discharge him in his own stocking on the left still wrapping on the right. This is the second time we have healed the left leg but he did not put a stocking on last time. Hopefully this will maintain the edema from chronic venous disease with secondary lymphedema 10/6; he comes in today having a stocking on the left leg. They had trouble getting it on there is a lot of increase in swelling 2 small open areas one anteriorly and one on the medial calf. They report a lot of difficulty getting the stocking on. ooParadoxically the area on the right that we have been wrapping posteriorly is closed 10/13; he comes in today with wounds bilaterally including superficial areas on the left medial and left lateral calf. As well as the right posterior has reopened in the Achilles area superiorly. He still does not have his juxta lite stockings although truthfully we would not of been able to use them today anyway. Apparently have been ordered and paid for from [employer] although they have not been delivered 10/20; his area on the right is just the boat closed on the right posterior. Still has the area on the left lateral and a very tiny area on the left medial. He has his bilateral juxta lites although he is not ready for them this week. He tolerated the increase to 4 layer compression last week quite well 10/27;  the area on the right posterior calf is once again closed. He has a superficial area on the left lateral calf that is still open. He has been using Hydrofera Blue and bilateral 4-layer compression. He can change to his own juxta lite stocking on the right and we are instructing him today 11/3; the area on the right posterior calf reopened according to the patient and his wife after they took off the stocking when they got home last week. Apparently scabbed over there is now a fairly substantial wound which looks pretty much the same. Our intake nurse noted that they were using the juxta lite stockings appropriately. I was really hoping I might be able to close him out today. He has 1 very tiny remaining area on the left lateral lower leg. 11/10; right posterior calf wound measures smaller but is still open. We have been using Hydrofera Blue. On the left he has a small oval-shaped wound and he seems to have had another wound distally that is open and likely a blister. We are using Hydrofera Blue under compression 11/17; right posterior calf wound continues to get better. We have been using Hydrofera Blue. On the left lateral one of the wounds has closed still a small open area. We have been using Hydrofera Blue on this as well. Both areas have been under 4-layer compression Arrives in clinic today with some swelling in the dorsal  foot on the right some erythema of his forefoot and toes. Initially when I looked at this I almost thought this was a sunburn distal to a wrap injury. 12/1; right posterior calf wound debrided with a curette. We have been using Hydrofera Blue on the left anterior lateral he has an area across the mid tibia. Finally a small area on the left lateral lower calf. Finally he continues to have de-epithelialized areas on the dorsal aspect of his toes. Initially thought this might be a burn injury when I saw him 2 weeks ago. I now wonder about tinea. I have also reviewed his  arterial studies which were really quite good in July/20 with normal TBI's and ABIs but monophasic waveforms 12/8; comes in today with worsening problems especially on the left leg where he now has a cluster of wounds in the left anterior mid tibia. Very poor edema control. I reduced him to 3 layer from 4 layer compression last week because of some concern about blood flow to his toes however he does not have good edema control on the left leg. Right leg edema control looks satisfactory. ooOn the left he has a cluster of wounds anteriorly, small area on the left medial fifth met head and then the collection of areas on his toes which appear better ooOn the right he has the original area on the right posterior calf, a new area right medially. His formal arterial studies from mid July are noted below. He was evaluated by Dr.Berry ABI Findings: +---------+------------------+-----+----------+--------+ Right Rt Pressure (mmHg)IndexWaveform Comment  +---------+------------------+-----+----------+--------+ Brachial 176     +---------+------------------+-----+----------+--------+ ATA 176 0.99 monophasic  +---------+------------------+-----+----------+--------+ PTA 183 1.03 monophasic  +---------+------------------+-----+----------+--------+ PERO 172 0.97 monophasic  +---------+------------------+-----+----------+--------+ Great Toe153 0.86    +---------+------------------+-----+----------+--------+ +---------+------------------+-----+-----------+-------+ Left Lt Pressure (mmHg)IndexWaveform Comment +---------+------------------+-----+-----------+-------+ Brachial 178     +---------+------------------+-----+-----------+-------+ ATA 162 0.91 multiphasic  +---------+------------------+-----+-----------+-------+ PTA 188 1.06 multiphasic  +---------+------------------+-----+-----------+-------+ PERO 158 0.89 monophasic    +---------+------------------+-----+-----------+-------+ Great Toe150 0.84    +---------+------------------+-----+-----------+-------+ +-------+-----------+-----------+------------+------------+ ABI/TBIToday's ABIToday's TBIPrevious ABIPrevious TBI +-------+-----------+-----------+------------+------------+ Right 1.03 0.86 1.11   +-------+-----------+-----------+------------+------------+ Left 1.06 0.84 1.27   +-------+-----------+-----------+------------+------------+ Tibial waveforms somewhat difficult to record due to irregular heartbeat. Bilateral ABIs appear essentially unchanged compared to prior study on 06/21/15. Summary: Right: Resting right ankle-brachial index is within normal range. No evidence of significant right lower extremity arterial disease. The right toe-brachial index is normal. Although ankle brachial indices are within normal limits (0.95-1.29), arterial Doppler waveforms at the ankle suggest some component of arterial occlusive disease. Left: Resting left ankle-brachial index is within normal range. No evidence of significant left lower extremity arterial disease. The left toe-brachial index is normal. Although ankle brachial indices are within normal limits (0.95-1.29), arterial Doppler waveforms at the ankle suggest some component of arterial occlusive disease. 12/15; the patient's area on the left mid tibia looks better. Right posterior calf also better. He has the area on the left foot as well. All of his toes look better I think this was tinea. We are using Hydrofera Blue everywhere else The patient was in urgent care yesterday with wheezing and shortness of breath. He got azithromycin and prednisone. He feels better. His lungs are currently clear to auscultation. He was not tested for Covid 19 12/29; the patient arrives in clinic today with quite a bit change. 2 weeks ago he only had areas on the left mid tibia right posterior calf  with tinea pedis resolving between his toes. He arrives in clinic today with several areas on the  dorsal toes on the right, dorsal left second toe. He has skin breakdown in the left medial calf probably from excess edema. Small area proximally in the medial calf. He has weeping edema fluid coming out of the skin excoriations on the left medial calf. He tells me that he is having a cardiac catheterization next week. I had a quick look at [facility]. He was found to have an ejection fraction of 25% during the work-up for persistent atrial fibrillation. He saw his cardiology office yesterday seen by the nurse practitioner. She increased his carvedilol. He has not been on diuretics for apparently several months and indeed in the nurse practitioner Dietrich Pates notes she had knowledge of this. 10/04/2019 on evaluation today patient presents as a walk-in visit concerning the fact that he did not have an appointment here for our clinic at this point. He actually had a cardiac catheterization earlier today and then came from there to here in order to be evaluated. With that being said unfortunately he is having significant cellulitis of his left lower extremity upon evaluation today this appears to have deteriorated even since last week's evaluation with Dr. Dellia Nims. The right lower extremity is actually doing okay I really see no evidence of deterioration at this point at those locations. In fact the right leg seems in general be doing quite well. Nonetheless I am concerned about infection and cellulitis of the left lower extremity and again considering his weakened heart I do not want him to develop into sepsis at all. He is also having some trouble breathing today and I understand according to nursing staff this is always the case to some degree. With that being said the patient unfortunately seems to be in my opinion a little bit worse even his wife feels like that may be the case today. Unsure  exactly what is leading to this. Cardiac catheterization I did review the report which showed an ejection fraction of 25% he also had an LAD blockage of around 25% based on what I saw. With that being said there was no significant blockages that required stenting at this point he does have weakened cardiac muscles compared to normal. 10/05/2019; patient was seen yesterday in clinic. He was sent to the ER because of cellulitis of the left leg possibility. In the ER he was given 1 dose of IV Levaquin and discharged on Keflex. He came in the clinic initially for a nurse visit to rewrap his left leg. We did not look at the right leg today that is an Haematologist. The patient also had a cardiac cath. According to him there were no blockages but a very low ejection fraction. 1/12; back for an early follow-up. The condition of the left lower leg is a lot better although there are multiple open areas. All of them with not a particularly viable surface. On the right posterior calf he has a single wound with a clean surface. He has a wound with exposed bone on the PIP of the left second toe dorsally. He has wounds on the dorsal right first second and third toes. His arterial studies were normal. 1/19; the patient has 3 open wounds on the left leg anteriorly in the mid tibia, distally and medially just above the ankle and posteriorly. On the right he has a small area dime sized on the right posterior calf. His edema control is a lot better. He still has wounds on the right second and left second toes. The left second  toe has exposed bone. X- ray I did last week did not show evidence of osteomyelitis in the left foot. 1/26; patient with a multiplicity of wounds and problems. ooOn the left he has a circular area on the left anterior mid tibia ooLeft just above the medical ankle -left 2nd toe pip -right 2nd toe right posterior calf Objective Constitutional Patient is hypotensive. appears stable. Vitals Time  Taken: 9:00 AM, Height: 74 in, Weight: 212 lbs, BMI: 27.2, Temperature: 97.5 F, Pulse: 75 bpm, Respiratory Rate: 20 breaths/min, Blood Pressure: 95/71 mmHg. General Notes: wound exam; debridement of the left anterior leg not vialbe surface extensive bone debridement of left 2nd toe. digital block. spec for C+S Integumentary (Hair, Skin) Wound #13 status is Open. Original cause of wound was Gradually Appeared. The wound is located on the Left Toe Second. The wound measures 2.4cm length x 2.4cm width x 0.1cm depth; 4.524cm^2 area and 0.452cm^3 volume. There is bone, joint, and Fat Layer (Subcutaneous Tissue) Exposed exposed. There is no tunneling or undermining noted. There is a medium amount of serosanguineous drainage noted. The wound margin is distinct with the outline attached to the wound base. There is no granulation within the wound bed. There is a large (67-100%) amount of necrotic tissue within the wound bed including Eschar. Wound #18 status is Healed - Epithelialized. Original cause of wound was Gradually Appeared. The wound is located on the Left,Medial Foot. The wound measures 0cm length x 0cm width x 0cm depth; 0cm^2 area and 0cm^3 volume. There is no tunneling or undermining noted. There is a none present amount of drainage noted. The wound margin is flat and intact. There is no granulation within the wound bed. There is no necrotic tissue within the wound bed. Wound #19 status is Open. Original cause of wound was Gradually Appeared. The wound is located on the Left,Anterior Lower Leg. The wound measures 2.9cm length x 1.9cm width x 0.1cm depth; 4.328cm^2 area and 0.433cm^3 volume. There is Fat Layer (Subcutaneous Tissue) Exposed exposed. There is no tunneling or undermining noted. There is a medium amount of serosanguineous drainage noted. The wound margin is flat and intact. There is medium (34-66%) pink granulation within the wound bed. There is a medium (34-66%) amount  of necrotic tissue within the wound bed including Adherent Slough. Wound #20 status is Open. Original cause of wound was Gradually Appeared. The wound is located on the Right Toe Great. The wound measures 1cm length x 0.3cm width x 0.1cm depth; 0.236cm^2 area and 0.024cm^3 volume. There is Fat Layer (Subcutaneous Tissue) Exposed exposed. There is no tunneling or undermining noted. There is a small amount of serosanguineous drainage noted. The wound margin is distinct with the outline attached to the wound base. There is no granulation within the wound bed. There is a large (67-100%) amount of necrotic tissue within the wound bed including Eschar. Wound #21 status is Open. Original cause of wound was Gradually Appeared. The wound is located on the Right Toe Second. The wound measures 1cm length x 1cm width x 0.1cm depth; 0.785cm^2 area and 0.079cm^3 volume. There is Fat Layer (Subcutaneous Tissue) Exposed exposed. There is no tunneling or undermining noted. There is a small amount of serosanguineous drainage noted. The wound margin is flat and intact. There is medium (34-66%) pink granulation within the wound bed. There is a medium (34-66%) amount of necrotic tissue within the wound bed including Adherent Slough. Wound #22 status is Open. Original cause of wound was Gradually Appeared. The wound  is located on the Right Toe Third. The wound measures 0.1cm length x 0.1cm width x 0.1cm depth; 0.008cm^2 area and 0.001cm^3 volume. There is no tunneling or undermining noted. There is a small amount of serosanguineous drainage noted. The wound margin is flat and intact. There is no granulation within the wound bed. There is a large (67-100%) amount of necrotic tissue within the wound bed including Eschar. Wound #24 status is Open. Original cause of wound was Gradually Appeared. The wound is located on the [facility] Lower Leg. The wound measures 4.8cm length x 2cm width x 0.1cm depth; 7.54cm^2  area and 0.754cm^3 volume. There is Fat Layer (Subcutaneous Tissue) Exposed exposed. There is no tunneling or undermining noted. There is a medium amount of serosanguineous drainage noted. The wound margin is flat and intact. There is medium (34-66%) pink granulation within the wound bed. There is a medium (34-66%) amount of necrotic tissue within the wound bed including Adherent Slough. Wound #25 status is Open. Original cause of wound was Gradually Appeared. The wound is located on the Left,Distal,Posterior Lower Leg. The wound measures 6.8cm length x 3cm width x 0.1cm depth; 16.022cm^2 area and 1.602cm^3 volume. There is Fat Layer (Subcutaneous Tissue) Exposed exposed. There is no tunneling or undermining noted. There is a medium amount of serosanguineous drainage noted. The wound margin is flat and intact. There is medium (34-66%) pink granulation within the wound bed. There is a medium (34-66%) amount of necrotic tissue within the wound bed including Adherent Slough. Wound #26 status is Open. Original cause of wound was Gradually Appeared. The wound is located on the Left,Medial Lower Leg. The wound measures 1.4cm length x 0.6cm width x 0.1cm depth; 0.66cm^2 area and 0.066cm^3 volume. There is Fat Layer (Subcutaneous Tissue) Exposed exposed. There is no tunneling noted. There is a medium amount of serosanguineous drainage noted. The wound margin is flat and intact. There is large (67- 100%) red granulation within the wound bed. There is a small (1-33%) amount of necrotic tissue within the wound bed including Adherent Slough. Wound #27 status is Healed - Epithelialized. Original cause of wound was Gradually Appeared. The wound is located on the Left,Proximal,Lateral Lower Leg. The wound measures 0cm length x 0cm width x 0cm depth; 0cm^2 area and 0cm^3 volume. There is no tunneling or undermining noted. There is a none present amount of drainage noted. The wound margin is flat and intact.  There is no granulation within the wound bed. There is no necrotic tissue within the wound bed. Wound #3R status is Open. Original cause of wound was Gradually Appeared. The wound is located on the Right,Posterior Calf. The wound measures 1.1cm length x 0.8cm width x 0.1cm depth; 0.691cm^2 area and 0.069cm^3 volume. There is Fat Layer (Subcutaneous Tissue) Exposed exposed. There is no tunneling or undermining noted. There is a medium amount of serosanguineous drainage noted. The wound margin is distinct with the outline attached to the wound base. There is large (67-100%) pink granulation within the wound bed. There is a small (1-33%) amount of necrotic tissue within the wound bed including Adherent Slough. 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 There was a Excisional Skin/Subcutaneous Tissue/Muscle/Bone Debridement with a total area of 5.76 sq cm performed by Ricard Dillon., MD. With the following instrument(s): Rongeur to remove Viable and Non-Viable tissue/material. Material removed includes Bone,Subcutaneous Tissue, Slough, Skin: Dermis, and Skin: Epidermis after achieving pain control using Lidocaine Injectable: 1%. 1 specimen was  taken by a Tissue Culture and sent to the lab per facility protocol. A time out was conducted at 09:48, prior to the start of the procedure. A Minimum amount of bleeding was controlled with Pressure. The procedure was tolerated well with a pain level of 3 throughout and a pain level of 5 following the procedure. Post Debridement Measurements: 2.4cm length x 2.4cm width x 0.1cm depth; 0.452cm^3 volume. Character of Wound/Ulcer Post Debridement is improved. Severity of Tissue Post Debridement is: Fat layer exposed. Post procedure Diagnosis Wound #13: Same as Pre-Procedure Wound #19 Pre-procedure diagnosis of Wound #19 is a Venous Leg Ulcer located on the Left,Anterior Lower Leg .Severity of Tissue Pre Debridement is: Fat layer exposed. There was a Excisional Skin/Subcutaneous Tissue Debridement with a total area of 5.51 sq cm performed by Ricard Dillon., MD. With the following instrument(s): Curette to remove Viable and Non-Viable tissue/material. Material removed includes Subcutaneous Tissue, Slough, Skin: Dermis, and Skin: Epidermis after achieving pain control using Lidocaine 5% topical ointment. No specimens were taken. A time out was conducted at 09:48, prior to the start of the procedure. A Minimum amount of bleeding was controlled with Pressure. The procedure was tolerated well with a pain level of 3 throughout and a pain level of 5 following the procedure. Post Debridement Measurements: 2.9cm length x 1.9cm width x 0.1cm depth; 0.433cm^3 volume. Character of Wound/Ulcer Post Debridement is improved. Severity of Tissue Post Debridement is: Fat layer exposed. Post procedure Diagnosis Wound #19: Same as Pre-Procedure Plan Follow-up Appointments: Return Appointment in 1 week. - Tuesday **********EXTRA TIME**************** Dressing Change Frequency: Wound #13 Left Toe Second: Change dressing every day. Wound #19 Left,Anterior Lower Leg: Do not change entire dressing for one  week. Wound #20 Right Toe Great: Change dressing every day. Wound #21 Right Toe Second: Change dressing every day. Wound #22 Right Toe Third: Change dressing every day. Wound #24 Left,Distal,Anterior Lower Leg: Do not change entire dressing for one week. Wound #25 Left,Distal,Posterior Lower Leg: Do not change entire dressing for one week. Wound #26 Left,Medial Lower Leg: Do not change entire dressing for one week. Wound #3R Right,Posterior Calf: Do not change entire dressing for one week. Skin Barriers/Peri-Wound Care: TCA Cream or Ointment Wound Cleansing: May shower with protection. Primary Wound Dressing: Wound #13 Left Toe Second: Calcium Alginate with Silver Wound #19 Left,Anterior Lower Leg: Iodoflex Wound #20 Right Toe Great: Calcium Alginate with Silver Wound #21 Right Toe Second: Calcium Alginate with Silver Wound #22 Right Toe Third: Calcium Alginate with Silver Wound #24 Left,Distal,Anterior Lower Leg: Iodoflex Wound #25 Left,Distal,Posterior Lower Leg: Iodoflex Wound #26 Left,Medial Lower Leg: Iodoflex Wound #3R Right,Posterior Calf: Iodoflex Secondary Dressing: Dry Gauze - secure toes with tape Edema Control: Unna Boots Bilaterally Off-Loading: Open toe surgical shoe to: - left and right foot Laboratory ordered were: Anerobic culture, bone left 2nd toe - non healing wound with bone exposed 1  no change in dressing iodoflex to the leg wounds and alginater to the toes 2 await bone C+S left 2nd toe no antibiotics UNNa boots Electronic Signature(s) Signed: 10/24/2019 5:32:11 PM By: [name] MD Entered By: [name] on 10/24/2019 10:08:32 -------------------------------------------------------------------------------- SuperBill Details Patient Name: Date of Service: Armando Gang 10/24/2019 Medical Record [id number] Patient Account Number: [id number] Date of Birth/Sex: Treating RN: 1941/09/06 (79 y.o. M) Primary Care Provider:  Hurshel Party Other Clinician: Referring Provider: Treating Provider/Extender:Ermalinda Joubert, Cheryl Flash, [place] Weeks in Treatment: 34 Diagnosis Coding ICD-10 Codes Code Description E11.51 Type 2 diabetes mellitus with diabetic peripheral angiopathy without gangrene L97.211 Non-pressure chronic ulcer of right calf limited to breakdown of skin E11.42 Type 2 diabetes mellitus with diabetic polyneuropathy L97.221 Non-pressure chronic ulcer of left calf limited to breakdown of skin I87.333 Chronic venous hypertension (idiopathic) with ulcer and inflammation of bilateral lower extremity L97.524 Non-pressure chronic ulcer of other part of left foot with necrosis of bone L97.511 Non-pressure chronic ulcer of other part of right foot limited to breakdown of skin G38.75 Chronic systolic (congestive) heart failure Facility Procedures The patient participates with Medicare or their insurance follows the Medicare Facility Guidelines: CPT4 Code Description Modifier Quantity [id number] 11042 - DEB SUBQ TISSUE 20 SQ CM/< 59 1 ICD-10 Diagnosis Description L97.221 Non-pressure chronic ulcer  of left calf limited to breakdown of skin The patient participates with Medicare or their insurance follows the Medicare Facility Guidelines: [id number] 11044 - DEB BONE 20 SQ CM/< 1 ICD-10 Diagnosis Description L97.524 Non-pressure chronic ulcer of other part of left foot with necrosis of bone Physician Procedures CPT4 Description: Code [id number] - WC PHYS SUBQ TISS 20 SQ CM ICD-10 Diagnosis Description L97.221 Non-pressure chronic ulcer of left calf limited to breakdown of ski [id number] Debridement; bone (includes epidermis, dermis, subQ tissue, muscle and/or  fascia, if performed) 1st 20 sqcm or less ICD-10 Diagnosis Description L97.524 Non-pressure chronic ulcer of other part of left foot with necrosis Modifier: 59 n 1 of bone Quantity: 1 Electronic Signature(s) Signed: 10/24/2019 5:32:11 PM By: [name]  MD Entered By: [name] on 10/24/2019 10:09:02

## 2019-10-29 LAB — AEROBIC/ANAEROBIC CULTURE W GRAM STAIN (SURGICAL/DEEP WOUND)

## 2019-10-30 NOTE — Progress Notes (Signed)
MCARTHUR, IVINS ([id number]) Visit Report for 10/24/2019 Arrival Information Details Patient Name: Date of Service: ZALE, MARCOTTE 10/24/2019 8:45 AM Medical Record [place] Patient Account Number: [id number] Date of Birth/Sex: Treating RN: 1940/11/26 (79 y.o. [name] Primary Care Dorse Locy: [place]urs[place] Party Other Clinician: Referring Christina Gintz: Treating Belle Charlie/Extender:Robson, Cheryl Flash, [place] Weeks in Treatment: 4 Visit Information History Since Last Visit Added or deleted any medications: Yes Patient Arrived: Wheel Chair Any new allergies or adverse reactions: No Arrival Time: 08:59 Had a fall or experienced change in No Accompanied By: wife activities of daily living that may affect Transfer Assistance: None risk of falls: Patient Identification Verified: Yes Signs or symptoms of abuse/neglect since last No Secondary Verification Process Yes visito Completed: Hospitalized since last visit: No Patient Requires Transmission- No Implantable device outside of the clinic excluding No Based Precautions: cellular tissue based products placed in the center Patient Has Alerts: Yes since last visit: Patient Alerts: Patient on Blood Has Dressing in Place as Prescribed: Yes Thinner Has Compression in Place as Prescribed: Yes Pain Present Now: No Electronic Signature(s) Signed: 10/30/2019 6:39:44 PM By: Levan Hurst RN, BSN Entered By: Levan Hurst on 10/24/2019 08:59:31 -------------------------------------------------------------------------------- Compression Therapy Details Patient Name: Date of Service: [name] 10/24/2019 8:45 AM Medical Record [id number] Patient Account Number: [id number] Date of Birth/Sex: Treating RN: [birth year] (78 y.o. [name]) [name] Primary Care Rayel Santizo: Hurshel Party Other Clinician: Referring Tayten Heber: Treating Cyler Kappes/Extender:Robson, Cheryl Flash, [place] Weeks in Treatment: 34 Compression  Therapy Performed for Wound Wound #24 Left,Distal,Anterior Lower Leg Assessment: Performed By: Jake Church, RN Compression Type: Rolena Infante Post Procedure Diagnosis Same as Pre-procedure Electronic Signature(s) Signed: 10/25/2019 7:19:07 AM By: [name] RN Entered By: [name] on 10/24/2019 10:09:30 -------------------------------------------------------------------------------- Compression Therapy Details Patient Name: Date of Service: ANAV, LAMMERT 10/24/2019 8:45 AM Medical Record [id number] Patient Account Number: [id number] Date of Birth/Sex: Treating RN: [birth year] (78 y.o. [name]) [name] Primary Care Conna Terada: H[place]hel Party Other Clinician: Referring Harumi Yamin: Treating Jedrek Dinovo/Extender:Robson, Cheryl Flash, [place] Weeks in Treatment: 34 Compression Therapy Performed for Wound Wound #25 Left,Distal,Posterior Lower Leg Assessment: Performed By: Jake Church, RN Compression Type: Rolena Infante Post Procedure Diagnosis Same as Pre-procedure Electronic Signature(s) Signed: 10/25/2019 7:19:07 AM By: [name] RN Entered By: [name] on 10/24/2019 10:09:30 -------------------------------------------------------------------------------- Compression Therapy Details Patient Name: Date of Service: JARIN, CORNFIELD 10/24/2019 8:45 AM Medical Record [id number] Patient Account Number: [id number] Date of Birth/Sex: Treating RN: 14-Jun-1941 (78 y.o. [name]) [name] Primary Care Zaryah Seckel: Hurshel Party Other Clinician: Referring Sherra Kimmons: Treating Stafford Riviera/Extender:Robson, Cheryl Flash, [place] Weeks in Treatment: 34 Compression Therapy Performed for Wound Wound #26 Left,Medial Lower Leg Assessment: Performed By: Jake Church, RN Compression Type: Rolena Infante Post Procedure Diagnosis Same as Pre-procedure Electronic Signature(s) Signed: 10/25/2019 7:19:07 AM By: [name] RN Entered By: [name] on 10/24/2019  10:09:30 -------------------------------------------------------------------------------- Compression Therapy Details Patient Name: Date of Service: JEZIAH, KRETSCHMER 10/24/2019 8:45 AM Medical Record [id number] Patient Account Number: [id number] Date of Birth/Sex: Treating RN: 1941/08/23 (78 y.o. [name]) [name] Primary Care Voyd Groft: Hurshel Party Other Clinician: Referring Bayan Hedstrom: Treating Adeoluwa Silvers/Extender:Robson, Cheryl Flash, [place] Weeks in Treatment: 34 Compression Therapy Performed for Wound Wound #3R Right,Posterior Calf Assessment: Performed By: Jake Church, RN Compression Type: Rolena Infante Post Procedure Diagnosis Same as Pre-procedure Electronic Signature(s) Signed: 10/25/2019 7:19:07 AM By: [name] RN Entered By: [name] on 10/24/2019 10:09:30 -------------------------------------------------------------------------------- Compression Therapy Details Patient Name: Date of Service: HAYDN, HUTSELL. 10/24/2019 8:45 AM Medical Record  [id number] Patient Account Number: [id number] Date of Birth/Sex: Treating RN: 1941-01-14 (78 y.o. [name]) [name] Primary Care Taniqua Issa: Hurshel Party Other Clinician: Referring Beauden Tremont: Treating Dhamar Gregory/Extender:Robson, Cheryl Flash, [place] Weeks in Treatment: 55 Compression Therapy Performed for Wound Wound #19 Left,Anterior Lower Leg Assessment: Performed By: Jake Church, RN Compression Type: Rolena Infante Post Procedure Diagnosis Same as Pre-procedure Electronic Signature(s) Signed: 10/25/2019 7:19:07 AM By: [name] RN Entered By: [name] on 10/24/2019 10:09:30 -------------------------------------------------------------------------------- Encounter Discharge Information Details Patient Name: Date of Service: [name] 10/24/2019 8:45 AM Medical Record [id number] Patient Account Number: [id number] Date of Birth/Sex: Treating RN: 12-17-1940 (79 y.o. [name] Primary Care Caylon Saine: Hurshel Party Other Clinician: Referring Tessla Spurling: Treating Umer Harig/Extender:Robson, Cheryl Flash, [place] Weeks in Treatment: 46 Encounter Discharge Information Items Post Procedure Vitals Discharge Condition: Stable Temperature (F): 97.5 Ambulatory Status: Wheelchair Pulse (bpm): 75 Discharge Destination: Home Respiratory Rate (breaths/min): 20 Transportation: Private Auto Blood Pressure (mmHg): 95/71 Accompanied By: wife Schedule Follow-up Appointment: Yes Clinical Summary of Care: Patient Declined Electronic Signature(s) Signed: 10/26/2019 7:44:20 AM By: Kela Millin Entered By: Kela Millin on 10/24/2019 12:15:53 -------------------------------------------------------------------------------- Lower Extremity Assessment Details Patient Name: Date of Service: OSUALDO, HANSELL 10/24/2019 8:45 AM Medical Record [id number] Patient Account Number: [id number] Date of Birth/Sex: Treating RN: Jan 09, 1941 (79 y.o. [name] Primary Care Emaree Chiu: Hurshel Party Other Clinician: Referring Lior Cartelli: Treating Laruth Hanger/Extender:Robson, Cheryl Flash, [place] Weeks in Treatment: 34 Edema Assessment Assessed: [Left: No] [Right: No] Edema: [Left: Yes] [Right: Yes] Calf Left: Right: Point of Measurement: 31 cm From Medial Instep 30.5 cm 29 cm Ankle Left: Right: Point of Measurement: 11 cm From Medial Instep 20 cm 20 cm Vascular Assessment Pulses: Dorsalis Pedis Palpable: [Left:Yes] [Right:Yes] Electronic Signature(s) Signed: 10/30/2019 6:39:44 PM By: Levan Hurst RN, BSN Entered By: Levan Hurst on 10/24/2019 09:40:09 -------------------------------------------------------------------------------- Multi Wound Chart Details Patient Name: Date of Service: [name] 10/24/2019 8:45 AM Medical Record [id number] Patient Account Number: [id number] Date of Birth/Sex: Treating RN: 05-10-1941 (79 y.o.  M) Primary Care Rashauna Tep: Hurshel Party Other Clinician: Referring Kataya Guimont: Treating Mattilyn Crites/Extender:Robson, Cheryl Flash, [place] Weeks in Treatment: 34 Vital Signs Height(in): 74 Pulse(bpm): 75 Weight(lbs): 212 Blood Pressure(mmHg): 95/71 Body Mass Index(BMI): 27 Temperature(F): 97.5 Respiratory 20 Rate(breaths/min): Photos: [13:No Photos] [18:No Photos] [19:No Photos] Wound Location: [13:Left Toe Second] [18:Left Foot - Medial] [19:Left, Anterior Lower Leg] Wounding Event: [13:Gradually Appeared] [18:Gradually Appeared] [19:Gradually Appeared] Primary Etiology: [13:Diabetic Wound/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] [18:Lower Extremity] Secondary Etiology: [13:N/A] [18:N/A] [19:N/A] Comorbid History: [13:Cataracts, Hypertension, Cataracts, Hypertension, Cataracts, Hypertension, Peripheral Venous Disease, Peripheral Venous Disease, Peripheral Venous Disease, Type II Diabetes, Gout, Received Radiation] [18:Type II Diabetes, Gout,  Received Radiation] [19:Type II Diabetes, Gout, Received Radiation] Date Acquired: [13:08/22/2019] [18:09/03/2019] [19:09/23/2019] Weeks of Treatment: [13:9] [18:7] [19:4] Wound Status: [13:Open] [18:Healed - Epithelialized] [19:Open] Wound Recurrence: [13:No] [18:No] [19:No] Clustered Wound: [13:No] [18:No] [19:Yes] Clustered Quantity: [13:N/A] [18:N/A] [19:1] Measurements L x W x D 2.4x2.4x0.1 [18:0x0x0] [19:2.9x1.9x0.1] (cm) Area (cm) : [13:4.524] [18:0] [19:4.328] Volume (cm) : [13:0.452] [18:0] [19:0.433] % Reduction in Area: [13:-46.90%] [18:100.00%] [19:91.90%] % Reduction in Volume: -46.80% [18:100.00%] [19:91.90%] Classification: [13:Grade 2] [18:Grade 2] [19:Full Thickness Without Exposed Support Structures] Exudate Amount: [13:Medium] [18:None Present] [19:Medium] Exudate Type: [13:Serosanguineous] [18:N/A] [19:Serosanguineous] Exudate Color: [13:red, brown] [18:N/A] [19:red, brown] Wound  Margin: [13:Distinct, outline attached] [18:Flat and Intact] [19:Flat and Intact] Granulation Amount: [13:None Present (0%)] [18:None Present (0%)] [19:Medium (34-66%)] Granulation Quality: [13:N/A] [18:N/A] [19:Pink] Necrotic Amount: [13:Large (67-100%)] [18:None  Present (0%)] [19:Medium (34-66%)] Necrotic Tissue: [13:Eschar] [18:N/A] [19:Adherent Slough] Exposed Structures: [13:Fat Layer (Subcutaneous Tissue) Exposed: Yes Joint: Yes Bone: Yes Fascia: No Tendon: No Muscle: No] [18:Fascia: No Fat Layer (Subcutaneous Tissue) Exposed: No Tendon: No Muscle: No Joint: No Bone: No] [19:Fat Layer (Subcutaneous Tissue) Exposed: Yes  Fascia: No Tendon: No Muscle: No Joint: No Bone: No] Epithelialization: [13:None] [18:Large (67-100%)] [19:Small (1-33%)] Debridement: [13:Debridement - Excisional] [18:N/A] [19:Debridement - Excisional] Pre-procedure [13:09:48] [18:N/A] [19:09:48] Verification/Time Out Taken: Pain Control: [13:Lidocaine Injectable] [18:N/A] [19:Lidocaine 5% topical ointment] Tissue Debrided: [13:Bone, Subcutaneous, Slough] [18:N/A] [19:Subcutaneous, Slough] Level: [13:Skin/Subcutaneous Tissue/Muscle/Bone] [18:N/A] [19:Skin/Subcutaneous Tissue] Debridement Area (sq cm):5.76 [18:N/A] [19:5.51] Instrument: [13:Rongeur] [18:N/A] [19:Curette] Bleeding: [13:Minimum] [18:N/A] [19:Minimum] Hemostasis Achieved: [13:Pressure] [18:N/A] [19:Pressure] Procedural Pain: [13:3] [18:N/A] [19:3] Post Procedural Pain: [13:5] [18:N/A] [19:5] Debridement Treatment Procedure was tolerated [18:N/A] [19:Procedure was tolerated] Response: [13:well] [19:well] Post Debridement [13:2.4x2.4x0.1] [18:N/A] [19:2.9x1.9x0.1] Measurements L x W x D (cm) Post Debridement [13:0.452] [18:N/A] [19:0.433] Volume: (cm) Procedures Performed: Debridement [13:20] [18:N/A 21] [19:Debridement 22] Photos: [13:No Photos] [18:No Photos] [19:No Photos] Wound Location: [13:Right Toe Great] [18:Right Toe Second] [19:Right Toe  Third] Wounding Event: [13:Gradually Appeared] [18:Gradually Appeared] [19:Gradually Appeared] Primary Etiology: [13:Diabetic Wound/Ulcer of the Diabetic Wound/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] [18:Lower Extremity] [19:Lower Extremity] Secondary Etiology: [13:N/A] [18:N/A] [19:N/A] Comorbid History: [13:Cataracts, Hypertension, Cataracts, Hypertension, Cataracts, Hypertension, Peripheral Venous Disease, Peripheral Venous Disease, Peripheral Venous Disease, Type II Diabetes, Gout, Received Radiation] [18:Type II Diabetes, Gout,  Received Radiation] [19:Type II Diabetes, Gout, Received Radiation] Date Acquired: [13:09/23/2019] [18:09/23/2019] [19:09/23/2019] Weeks of Treatment: [13:4] [18:4] [19:4] Wound Status: [13:Open] [18:Open] [19:Open] Wound Recurrence: [13:No] [18:No] [19:No] Clustered Wound: [13:No] [18:No] [19:Yes] Clustered Quantity: [13:N/A] [18:N/A] [19:2] Measurements L x W x D 1x0.3x0.1 [18:1x1x0.1] [19:0.1x0.1x0.1] (cm) Area (cm) : [13:0.236] [18:0.785] [19:0.008] Volume (cm) : [13:0.024] [18:0.079] [19:0.001] % Reduction in Area: [13:53.80%] [18:-18.90%] [19:99.30%] % Reduction in Volume: [13:52.90%] [18:-19.70%] [19:99.20%] Classification: [13:Grade 2] [18:Grade 2] [19:Grade 2] Exudate Amount: [13:Small] [18:Small] [19:Small] Exudate Type: [13:Serosanguineous] [18:Serosanguineous] [19:Serosanguineous] Exudate Color: [13:red, brown] [18:red, brown] [19:red, brown] Wound Margin: [13:Distinct, outline attached] [18:Flat and Intact] [19:Flat and Intact] Granulation Amount: [13:None Present (0%)] [18:Medium (34-66%)] [19:None Present (0%)] Granulation Quality: [13:N/A] [18:Pink] [19:N/A] Necrotic Amount: [13:Large (67-100%)] [18:Medium (34-66%)] [19:Large (67-100%)] Necrotic Tissue: [13:Eschar] [18:Adherent Slough] [19:Eschar] Exposed Structures: [13:Fat Layer (Subcutaneous Tissue) Exposed: Yes Fascia: No Tendon: No Muscle: No Joint: No Bone: No]  [18:Fat Layer (Subcutaneous Tissue) Exposed: Yes Fascia: No Tendon: No Muscle: No Joint: No Bone: No] [19:Fascia: No Fat Layer (Subcutaneous Tissue)  Exposed: No Tendon: No Muscle: No Joint: No Bone: No] Epithelialization: [13:Medium (34-66%)] [18:None] [19:Large (67-100%)] Debridement: [13:N/A] [18:N/A] [19:N/A] Pain Control: [13:N/A] [18:N/A] [19:N/A] Tissue Debrided: [13:N/A] [18:N/A] [19:N/A] Level: [13:N/A] [18:N/A] [19:N/A] Debridement Area (sq cm):N/A [18:N/A] [19:N/A] Instrument: [13:N/A] [18:N/A] [19:N/A] Bleeding: [13:N/A] [18:N/A] [19:N/A] Hemostasis Achieved: [13:N/A] [18:N/A] [19:N/A] Procedural Pain: [13:N/A] [18:N/A] [19:N/A] Post Procedural Pain: [13:N/A] [18:N/A] [19:N/A] Debridement Treatment N/A [18:N/A] [19:N/A] Response: Post Debridement [13:N/A] [18:N/A] [19:N/A] Measurements L x W x D (cm) Post Debridement [13:N/A] [18:N/A] [19:N/A] Volume: (cm) Procedures Performed: N/A [18:N/A 24] [19:N/A 25] Photos: [13:No Photos] [18:No Photos] [19:No Photos] Wound Location: [13:Left, Distal, Anterior Lower Left, Distal, Posterior Lower Left, Medial Lower Leg Leg] [18:Leg] Wounding Event: [13:Gradually Appeared] [18:Gradually Appeared] [19:Gradually Appeared] Primary Etiology: [13:Venous Leg Ulcer] [18:Venous Leg Ulcer] [19:Venous Leg Ulcer] Secondary Etiology: [13:Diabetic Wound/Ulcer of the Diabetic Wound/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] [18:Lower Extremity] [19:Lower Extremity] Comorbid History: [13:Cataracts, Hypertension, Cataracts, Hypertension,  Cataracts, Hypertension, Peripheral Venous Disease, Peripheral Venous Disease, Peripheral Venous Disease, Type II Diabetes, Gout, Received Radiation] [18:Type II Diabetes, Gout,  Received Radiation] [19:Type II Diabetes, Gout, Received Radiation] Date Acquired: [13:10/04/2019] [18:10/04/2019] [19:10/04/2019] Weeks of Treatment: [13:2] [18:2] [19:2] Wound Status: [13:Open] [18:Open] [19:Open] Wound Recurrence:  [13:No] [18:No] [19:No] Clustered Wound: [13:No] [18:Yes] [19:No] Clustered Quantity: [13:N/A] [18:3] [19:N/A] Measurements L x W x D 4.8x2x0.1 [18:6.8x3x0.1] [19:1.4x0.6x0.1] (cm) Area (cm) : [13:7.54] [18:16.022] [19:0.66] Volume (cm) : [13:0.754] [18:1.602] [19:0.066] % Reduction in Area: [13:-18.20%] [18:35.20%] [19:29.40%] % Reduction in Volume: [13:-18.20%] [18:35.20%] [19:29.00%] Classification: [13:Full Thickness Without Exposed Support Structures Exposed Support Structures Exposed Support Structures] [18:Full Thickness Without] [19:Full Thickness Without] Exudate Amount: [13:Medium] [18:Medium] [19:Medium] Exudate Type: [13:Serosanguineous] [18:Serosanguineous] [19:Serosanguineous] Exudate Color: [13:red, brown] [18:red, brown] [19:red, brown] Wound Margin: [13:Flat and Intact] [18:Flat and Intact] [19:Flat and Intact] Granulation Amount: [13:Medium (34-66%)] [18:Medium (34-66%)] [19:Large (67-100%)] Granulation Quality: [13:Pink] [18:Pink] [19:Red] Necrotic Amount: [13:Medium (34-66%)] [18:Medium (34-66%)] [19:Small (1-33%)] Necrotic Tissue: [13:Adherent Slough] [18:Adherent Slough] [19:Adherent Slough] Exposed Structures: [13:Fat Layer (Subcutaneous Fat Layer (Subcutaneous Fat Layer (Subcutaneous Tissue) Exposed: Yes Fascia: No Tendon: No Muscle: No Joint: No Bone: No] [18:Tissue) Exposed: Yes Fascia: No Tendon: No Muscle: No Joint: No Bone: No] [19:Tissue) Exposed: Yes  Fascia: No Tendon: No Muscle: No Joint: No Bone: No] Epithelialization: [13:Small (1-33%)] [18:Medium (34-66%)] [19:Medium (34-66%)] Debridement: [13:N/A] [18:N/A] [19:N/A] Pain Control: [13:N/A] [18:N/A] [19:N/A] Tissue Debrided: [13:N/A] [18:N/A] [19:N/A] Level: [13:N/A] [18:N/A] [19:N/A] Debridement Area (sq cm):N/A [18:N/A] [19:N/A] Instrument: [13:N/A] [18:N/A] [19:N/A] Bleeding: [13:N/A] [18:N/A] [19:N/A] Hemostasis Achieved: [13:N/A] [18:N/A] [19:N/A] Procedural Pain: [13:N/A] [18:N/A] [19:N/A] Post  Procedural Pain: [13:N/A] [18:N/A] [19:N/A] Debridement Treatment N/A [18:N/A] [19:N/A] Response: Post Debridement [13:N/A] [18:N/A] [19:N/A] Measurements L x W x D (cm) Post Debridement [13:N/A] [18:N/A] [19:N/A] Volume: (cm) Procedures Performed: N/A [18:N/A 27] [19:N/A 3R N/A] Photos: [13:No Photos] [18:No Photos] [19:N/A] Wound Location: [13:Left, Proximal, Lateral Lower Leg] [18:Right, Posterior Calf] [19:N/A] Wounding Event: [13:Gradually Appeared] [18:Gradually Appeared] [19:N/A] Primary Etiology: [13:Venous Leg Ulcer] [18:Diabetic Wound/Ulcer of the N/A Lower Extremity] Secondary Etiology: [13:Diabetic Wound/Ulcer of the N/A Lower Extremity] [19:N/A] Comorbid History: [13:Cataracts, Hypertension, Cataracts, Hypertension, N/A Peripheral Venous Disease, Peripheral Venous Disease, Type II Diabetes, Gout, Received Radiation] [18:Type II Diabetes, Gout, Received Radiation] Date Acquired: [13:10/04/2019] [18:02/28/2019] [19:N/A] Weeks of Treatment: [13:2] [18:34] [19:N/A] Wound Status: [13:Healed - Epithelialized] [18:Open] [19:N/A] Wound Recurrence: [13:No] [18:Yes] [19:N/A] Clustered Wound: [13:No] [18:No] [19:N/A] Clustered Quantity: [13:N/A] [18:N/A] [19:N/A] Measurements L x W x D 0x0x0 [18:1.1x0.8x0.1] [19:N/A] (cm) Area (cm) : [13:0] [18:0.691] [19:N/A] Volume (cm) : [13:0] [18:0.069] [19:N/A] % Reduction in Area: [13:100.00%] [18:97.20%] [19:N/A] % Reduction in Volume: [13:100.00%] [18:97.20%] [19:N/A] Classification: [13:Full Thickness Without Exposed Support Structures] [18:Grade 2] [19:N/A] Exudate Amount: [13:None Present] [18:Medium] [19:N/A] Exudate Type: [13:N/A] [18:Serosanguineous] [19:N/A] Exudate Color: [13:N/A] [18:red, brown] [19:N/A] Wound Margin: [13:Flat and Intact] [18:Distinct, outline attached] [19:N/A] Granulation Amount: [13:None Present (0%)] [18:Large (67-100%)] [19:N/A] Granulation Quality: [13:N/A] [18:Pink] [19:N/A] Necrotic Amount: [13:None  Present (0%)] [18:Small (1-33%)] [19:N/A] Necrotic Tissue: [13:N/A] [18:Adherent Slough] [19:N/A] Exposed Structures: [13:Fascia: No Fat Layer (Subcutaneous Tissue) Exposed: Yes Tissue) Exposed: No Tendon: No Muscle: No Joint: No Bone: No] [18:Fat Layer (Subcutaneous Fascia: No Tendon: No Muscle: No Joint: No Bone: No] [19:N/A] Epithelialization: [13:Large (67-100%)] [18:Medium (34-66%)] [19:N/A] Debridement: [13:N/A] [18:N/A] [19:N/A] Pain Control: [13:N/A] [18:N/A] [19:N/A] Tissue Debrided: [13:N/A] [18:N/A] [19:N/A] Level: [13:N/A] [18:N/A] [19:N/A] Debridement Area (sq cm):N/A [18:N/A] [19:N/A] Instrument: [13:N/A] [18:N/A] [19:N/A] Bleeding: [13:N/A] [18:N/A] [19:N/A] Hemostasis Achieved: [13:N/A] [18:N/A] [19:N/A] Procedural Pain: [13:N/A] [  18:N/A] [19:N/A] Post Procedural Pain: [13:N/A] [18:N/A] [19:N/A] Debridement Treatment N/A [18:N/A] [19:N/A] Response: Post Debridement [13:N/A] [18:N/A] [19:N/A] Measurements L x W x D (cm) Post Debridement [13:N/A] [18:N/A] [19:N/A] Volume: (cm) Procedures Performed: N/A [18:N/A] [19:N/A] Treatment Notes Electronic Signature(s) Signed: 10/24/2019 5:32:11 PM By: [name] MD Entered By: [name] on 10/24/2019 10:02:30 -------------------------------------------------------------------------------- Multi-Disciplinary Care Plan Details Patient Name: Date of Service: [name] 10/24/2019 8:45 AM Medical Record [id number] Patient Account Number: [id number] Date of Birth/Sex: Treating RN: December 03, 1940 (78 y.o. [name]) [name] Primary Care Melquisedec Journey: Hurshel Party Other Clinician: Referring Karthikeya Funke: Treating Keyontae Huckeby/Extender:Robson, Cheryl Flash, [place] Weeks in Treatment: 34 Active Inactive Venous Leg Ulcer Nursing Diagnoses: Knowledge deficit related to disease process and management Potential for venous Insuffiency (use before diagnosis confirmed) Goals: Patient will maintain optimal edema  control Date Initiated: 10/04/2019 Target Resolution Date: 11/01/2019 Goal Status: Active Interventions: Assess peripheral edema status every visit. Compression as ordered Treatment Activities: Therapeutic compression applied : 10/04/2019 Notes: Wound/Skin Impairment Nursing Diagnoses: Knowledge deficit related to ulceration/compromised skin integrity Goals: Patient/caregiver will verbalize understanding of skin care regimen Date Initiated: 02/28/2019 Target Resolution Date: 11/01/2019 Goal Status: Active Ulcer/skin breakdown will have a volume reduction of 30% by week 4 Date Initiated: 02/28/2019 Date Inactivated: 04/04/2019 Target Resolution Date: 03/31/2019 Goal Status: Met Ulcer/skin breakdown will have a volume reduction of 50% by week 8 Date Initiated: 04/04/2019 Date Inactivated: 05/09/2019 Target Resolution Date: 05/05/2019 Goal Status: Met Ulcer/skin breakdown will have a volume reduction of 80% by week 12 Date Initiated: 05/09/2019 Date Inactivated: 06/13/2019 Target Resolution Date: 06/09/2019 Unmet Goal Status: Unmet Reason: comorbities/new wounds Ulcer/skin breakdown will heal within 14 weeks Date Initiated: 06/13/2019 Date Inactivated: 07/11/2019 Target Resolution Date: 07/07/2019 Unmet Goal Status: Unmet Reason: comorbityies Interventions: Assess patient/caregiver ability to obtain necessary supplies Assess patient/caregiver ability to perform ulcer/skin care regimen upon admission and as needed Assess ulceration(s) every visit Notes: Electronic Signature(s) Signed: 10/25/2019 7:19:07 AM By: [name] RN Entered By: [name] on 10/24/2019 08:48:52 -------------------------------------------------------------------------------- Pain Assessment Details Patient Name: Date of Service: TASHEEM, ELMS 10/24/2019 8:45 AM Medical Record [id number] Patient Account Number: [id number] Date of Birth/Sex: Treating RN: January 25, 1941 (79 y.o. [name] Primary Care  Storie Heffern: Hurshel Party Other Clinician: Referring Yanil Dawe: Treating Kameko Hukill/Extender:Robson, Cheryl Flash, Erie Weeks in Treatment: 49 Active Problems Location of Pain Severity and Description of Pain Patient Has Paino No Site Locations Pain Management and Medication Current Pain Management: Electronic Signature(s) Signed: 10/30/2019 6:39:44 PM By: Levan Hurst RN, BSN Entered By: Levan Hurst on 10/24/2019 09:02:51 -------------------------------------------------------------------------------- Patient/Caregiver Education Details Patient Name: Date of Service: Armando Gang 1/26/2021andnbsp8:45 AM Medical Record [id number] Patient Account Number: [id number] Date of Birth/Gender: Treating RN: [birth year] (78 y.o. [name]) [name] Primary Care Physician: Hurshel Party Other Clinician: Referring Physician: Treating Physician/Extender:Robson, Cheryl Flash, [place] Weeks in Treatment: 6 Education Assessment Education Provided To: Patient Education Topics Provided Wound/Skin Impairment: Methods: Explain/Verbal Responses: State content correctly Electronic Signature(s) Signed: 10/25/2019 7:19:07 AM By: [name] RN Entered By: [name] on 10/24/2019 08:52:18 -------------------------------------------------------------------------------- Wound Assessment Details Patient Name: Date of Service: KHAIDEN, SEGRETO 10/24/2019 8:45 AM Medical Record [id number] Patient Account Number: [id number] Date of Birth/Sex: Treating RN: [birth year] (78 y.o. [name]) [name] Primary Care Radek Carnero: [place] Party Other Clinician: Referring Khalie Wince: Treating Yarelli Decelles/Extender:Robson, Cheryl Flash, [place] Weeks in Treatment: 34 Wound Status Wound Number: 13 Primary Diabetic Wound/Ulcer of the Lower Extremity Etiology: Wound Location: Left Toe Second Wound Open Wounding Event: Gradually Appeared Status: Date Acquired: 08/22/2019 Comorbid  Cataracts,  Hypertension, Peripheral Venous Weeks Of Treatment: 9 History: Disease, Type II Diabetes, Gout, Received Clustered Wound: No Radiation Photos Wound Measurements Length: (cm) 2.4 % Reducti Width: (cm) 2.4 % Reducti Depth: (cm) 0.1 Epithelia Area: (cm) 4.524 Tunnelin Volume: (cm) 0.452 Undermin Wound Description Classification: Grade 2 Wound Margin: Distinct, outline attached Exudate Amount: Medium Exudate Type: Serosanguineous Exudate Color: red, brown Wound Bed Granulation Amount: None Present (0%) Necrotic Amount: Large (67-100%) Necrotic Quality: Eschar Foul Odor After Cleansing: No Slough/Fibrino Yes Exposed Structure Fascia Exposed: No Fat Layer (Subcutaneous Tissue) Exposed: Yes Tendon Exposed: No Muscle Exposed: No Joint Exposed: Yes Bone Exposed: Yes on in Area: -46.9% on in Volume: -46.8% lization: None g: No ing: No Treatment Notes Wound #13 (Left Toe Second) 1. Cleanse With Wound Cleanser Soap and water 3. Primary Dressing Applied Calcium Alginate Ag 4. Secondary Dressing Dry Gauze Roll Gauze Electronic Signature(s) Signed: 10/25/2019 4:33:34 PM By: Mikeal Hawthorne EMT/HBOT Signed: 10/25/2019 5:50:55 PM By: [name] RN Previous Signature: 10/25/2019 7:19:07 AM Version By: [name] RN Entered By: Mikeal Hawthorne on 10/25/2019 11:14:41 -------------------------------------------------------------------------------- Wound Assessment Details Patient Name: Date of Service: DREVIN, ORTNER 10/24/2019 8:45 AM Medical Record [id number] Patient Account Number: [id number] Date of Birth/Sex: Treating RN: 07/10/1941 (79 y.o. M) Primary Care Karynn Deblasi: [place] Party Other Clinician: Referring Meha Vidrine: Treating Avani Sensabaugh/Extender:Robson, Cheryl Flash, [place] Weeks in Treatment: 34 Wound Status Wound Number: 18 Primary Diabetic Wound/Ulcer of the Lower Extremity Etiology: Wound Location: Left Foot - Medial Wound Healed -  Epithelialized Wounding Event: Gradually Appeared Status: Date Acquired: 09/03/2019 Comorbid Cataracts, Hypertension, Peripheral Venous Weeks Of Treatment: 7 History: Disease, Type II Diabetes, Gout, Received Clustered Wound: No Radiation Photos Wound Measurements Length: (cm) 0 % Reductio Width: (cm) 0 % Reductio Depth: (cm) 0 Epithelial Area: (cm) 0 Tunneling Volume: (cm) 0 Undermini Wound Description Classification: Grade 2 Foul Odor Wound Margin: Flat and Intact Slough/Fib Exudate Amount: None Present Wound Bed Granulation Amount: None Present (0%) Necrotic Amount: None Present (0%) Fascia Exp Fat Layer Tendon Exp Muscle Exp Joint Expo Bone Expos After Cleansing: No rino No Exposed Structure osed: No (Subcutaneous Tissue) Exposed: No osed: No osed: No sed: No ed: No n in Area: 100% n in Volume: 100% ization: Large (67-100%) : No ng: No Electronic Signature(s) Signed: 10/25/2019 4:33:34 PM By: Mikeal Hawthorne EMT/HBOT Entered By: Mikeal Hawthorne on 10/25/2019 11:14:18 -------------------------------------------------------------------------------- Wound Assessment Details Patient Name: Date of Service: MANDO, BLATZ 10/24/2019 8:45 AM Medical Record [id number] Patient Account Number: [id number] Date of Birth/Sex: Treating RN: [birth year] (78 y.o. [name]) [name] Primary Care Curtis Cain: [place] Party Other Clinician: Referring Karmella Bouvier: Treating Sava Proby/Extender:Robson, Cheryl Flash, [place] Weeks in Treatment: 34 Wound Status Wound Number: 19 Primary Venous Leg Ulcer Etiology: Wound Location: Left Lower Leg - Anterior Wound Open Wounding Event: Gradually Appeared Status: Date Acquired: 09/23/2019 Comorbid Cataracts, Hypertension, Peripheral Venous Weeks Of Treatment: 4 History: Disease, Type II Diabetes, Gout, Received Clustered Wound: Yes Radiation Photos Wound Measurements Length: (cm) 2.9 % Reduct Width: (cm) 1.9 %  Reduct Depth: (cm) 0.1 Epitheli Clustered Quantity: 1 Tunnelin Area: (cm) 4.328 Undermi Volume: (cm) 0.433 Wound Description Full Thickness Without Exposed Support Foul Odo Classification: Structures Slough/F Wound Flat and Intact Margin: Exudate Medium Amount: Exudate Serosanguineous Type: Exudate red, brown Color: Wound Bed Granulation Amount: Medium (34-66%) Granulation Quality: Pink Fascia E Necrotic Amount: Medium (34-66%) Fat Laye Necrotic Quality: Adherent Slough Tendon E Muscle E Joint Ex Bone Exp r After Cleansing: No ibrino Yes Exposed Structure xposed: No  r (Subcutaneous Tissue) Exposed: Yes xposed: No xposed: No posed: No osed: No ion in Area: 91.9% ion in Volume: 91.9% alization: Small (1-33%) g: No ning: No Treatment Notes Wound #19 (Left, Anterior Lower Leg) 1. Cleanse With Wound Cleanser Soap and water 2. Periwound Care Moisturizing lotion TCA Cream 3. Primary Dressing Applied Iodoflex 4. Secondary Dressing ABD Pad Dry Gauze 6. Support Layer Psychiatric nurse) Signed: 10/25/2019 4:33:34 PM By: Mikeal Hawthorne EMT/HBOT Signed: 10/25/2019 5:50:55 PM By: [name] RN Previous Signature: 10/25/2019 7:19:07 AM Version By: [name] RN Entered By: Mikeal Hawthorne on 10/25/2019 11:19:52 -------------------------------------------------------------------------------- Wound Assessment Details Patient Name: Date of Service: CHUNG, CHAGOYA 10/24/2019 8:45 AM Medical Record [id number] Patient Account Number: [id number] Date of Birth/Sex: Treating RN: [birth year] (78 y.o. [name]) [name] Primary Care Eh Sauseda: [place] Party Other Clinician: Referring Loralye Loberg: Treating Bayan Hedstrom/Extender:Robson, Cheryl Flash, [place] Weeks in Treatment: 34 Wound Status Wound Number: 20 Primary Diabetic Wound/Ulcer of the Lower Extremity Etiology: Wound Location: Right Toe Great Wound Open Wounding Event:  Gradually Appeared Status: Date Acquired: 09/23/2019 Comorbid Cataracts, Hypertension, Peripheral Venous Weeks Of Treatment: 4 History: Disease, Type II Diabetes, Gout, Received Clustered Wound: No Radiation Photos Wound Measurements Length: (cm) 1 % Reducti Width: (cm) 0.3 % Reducti Depth: (cm) 0.1 Epithelia Area: (cm) 0.236 Tunnelin Volume: (cm) 0.024 Undermin Wound Description Classification: Grade 2 Wound Margin: Distinct, outline attached Exudate Amount: Small Exudate Type: Serosanguineous Exudate Color: red, brown Wound Bed Granulation Amount: None Present (0%) Necrotic Amount: Large (67-100%) Necrotic Quality: Eschar Foul Odor After Cleansing: No Slough/Fibrino Yes Exposed Structure Fascia Exposed: No Fat Layer (Subcutaneous Tissue) Exposed: Yes Tendon Exposed: No Muscle Exposed: No Joint Exposed: No Bone Exposed: No on in Area: 53.8% on in Volume: 52.9% lization: Medium (34-66%) g: No ing: No Treatment Notes Wound #20 (Right Toe Great) 1. Cleanse With Wound Cleanser Soap and water 3. Primary Dressing Applied Calcium Alginate Ag 4. Secondary Dressing Dry Gauze Roll Gauze Electronic Signature(s) Signed: 10/25/2019 4:33:34 PM By: Mikeal Hawthorne EMT/HBOT Signed: 10/25/2019 5:50:55 PM By: [name] RN Previous Signature: 10/25/2019 7:19:07 AM Version By: [name] RN Entered By: Mikeal Hawthorne on 10/25/2019 10:58:27 -------------------------------------------------------------------------------- Wound Assessment Details Patient Name: Date of Service: BRIDGER, PIZZI 10/24/2019 8:45 AM Medical Record [id number] Patient Account Number: [id number] Date of Birth/Sex: Treating RN: 03/15/1941 (78 y.o. [name]) Dolores Lory, [place] Primary Care Kambri Dismore: [place] Party Other Clinician: Referring Molli Gethers: Treating Cayetano Mikita/Extender:Robson, Cheryl Flash, [place] Weeks in Treatment: 34 Wound Status Wound Number: 21 Primary Diabetic Wound/Ulcer of the  Lower Extremity Etiology: Wound Location: Right Toe Second Wound Open Wounding Event: Gradually Appeared Status: Date Acquired: 09/23/2019 Comorbid Cataracts, Hypertension, Peripheral Venous Weeks Of Treatment: 4 History: Disease, Type II Diabetes, Gout, Received Clustered Wound: No Radiation Photos Wound Measurements Length: (cm) 1 Width: (cm) 1 Depth: (cm) 0.1 Area: (cm) 0.785 Volume: (cm) 0.079 Wound Description Classification: Grade 2 Wound Margin: Flat and Intact Exudate Amount: Small Exudate Type: Serosanguineous Exudate Color: red, brown Wound Bed Granulation Amount: Medium (34-66%) Granulation Quality: Pink Necrotic Amount: Medium (34-66%) Necrotic Quality: Adherent Slough r After Cleansing: No ibrino Yes Exposed Structure posed: No (Subcutaneous Tissue) Exposed: Yes posed: No posed: No osed: No sed: No % Reduction in Area: -18.9% % Reduction in Volume: -19.7% Epithelialization: None Tunneling: No Undermining: No Foul Odo Slough/F Fascia Ex Fat Layer Tendon Ex Muscle Ex Joint Exp Bone Expo Treatment Notes Wound #21 (Right Toe Second) 1. Cleanse With Wound Cleanser Soap and water 3. Primary Dressing Applied  Calcium Alginate Ag 4. Secondary Dressing Dry Gauze Roll Gauze Electronic Signature(s) Signed: 10/25/2019 4:33:34 PM By: Mikeal Hawthorne EMT/HBOT Signed: 10/25/2019 5:50:55 PM By: [name] RN Previous Signature: 10/25/2019 7:19:07 AM Version By: [name] RN Entered By: Mikeal Hawthorne on 10/25/2019 11:00:59 -------------------------------------------------------------------------------- Wound Assessment Details Patient Name: Date of Service: KELDEN, LAVALLEE 10/24/2019 8:45 AM Medical Record [id number] Patient Account Number: [id number] Date of Birth/Sex: Treating RN: 1940-10-18 (78 y.o. [name]) Dolores Lory, [place] Hummingbird Primary Care Aleece Loyd: [place] Party Other Clinician: Referring Alegandro Macnaughton: Treating Sahmir Weatherbee/Extender:Robson,  Cheryl Flash, [place] Weeks in Treatment: 34 Wound Status Wound Number: 22 Primary Diabetic Wound/Ulcer of the Lower Extremity Etiology: Wound Location: Right Toe Third Wound Open Wounding Event: Gradually Appeared Wounding Event: Gradually Appeared Status: Date Acquired: 09/23/2019 Comorbid Cataracts, Hypertension, Peripheral Venous Weeks Of Treatment: 4 History: Disease, Type II Diabetes, Gout, Received Clustered Wound: Yes Radiation Photos Wound Measurements Length: (cm) 0.1 % Reducti Width: (cm) 0.1 % Reducti Depth: (cm) 0.1 Epithelia Clustered Quantity: 2 Tunneling Area: (cm) 0.008 Undermin Volume: (cm) 0.001 Wound Description Classification: Grade 2 Wound Margin: Flat and Intact Exudate Amount: Small Exudate Type: Serosanguineous Exudate Color: red, brown Wound Bed Granulation Amount: None Present (0%) Necrotic Amount: Large (67-100%) Necrotic Quality: Eschar Foul Odor After Cleansing: No Slough/Fibrino Yes Exposed Structure Fascia Exposed: No Fat Layer (Subcutaneous Tissue) Exposed: No Tendon Exposed: No Muscle Exposed: No Joint Exposed: No Bone Exposed: No on in Area: 99.3% on in Volume: 99.2% lization: Large (67-100%) : No ing: No Treatment Notes Wound #22 (Right Toe Third) 1. Cleanse With Wound Cleanser Soap and water 3. Primary Dressing Applied Calcium Alginate Ag 4. Secondary Dressing Dry Gauze Roll Gauze Electronic Signature(s) Signed: 10/25/2019 4:33:34 PM By: Mikeal Hawthorne EMT/HBOT Signed: 10/25/2019 5:50:55 PM By: [name] RN Previous Signature: 10/25/2019 7:19:07 AM Version By: [name] RN Entered By: Mikeal Hawthorne on 10/25/2019 11:01:26 -------------------------------------------------------------------------------- Wound Assessment Details Patient Name: Date of Service: AMATO, SEVILLANO 10/24/2019 8:45 AM Medical Record [id number] Patient Account Number: [id number] Date of Birth/Sex: Treating RN: Dec 11, 1940 (78  y.o. [name]) Dolores Lory, [place] Hummingbird Primary Care Cerise Lieber: [place] Party Other Clinician: Referring Cattaleya Wien: Treating Johnathen Testa/Extender:Robson, Cheryl Flash, [place] Weeks in Treatment: 34 Wound Status Wound Number: 24 Primary Venous Leg Ulcer Etiology: Wound Location: Left Lower Leg - Anterior, Distal Secondary Diabetic Wound/Ulcer of the Lower Extremity Wounding Event: Gradually Appeared Etiology: Date Acquired: 10/04/2019 Wound Open Weeks Of Treatment: 2 Status: Clustered Wound: No Comorbid Cataracts, Hypertension, Peripheral Venous History: Disease, Type II Diabetes, Gout, Received Radiation Photos Wound Measurements Length: (cm) 4.8 % Reduct Width: (cm) 2 % Reduct Depth: (cm) 0.1 Epitheli Area: (cm) 7.54 Tunneli Volume: (cm) 0.754 Undermi Wound Description Full Thickness Without Exposed Support Foul Odo Classification: Structures Slough/F Wound Flat and Intact Margin: Exudate Medium Amount: Exudate Serosanguineous Type: Exudate red, brown Color: Wound Bed Granulation Amount: Medium (34-66%) Granulation Quality: Pink Fascia E Necrotic Amount: Medium (34-66%) Fat Laye Necrotic Quality: Adherent Slough Tendon E Muscle E Joint Ex Bone Exp r After Cleansing: No ibrino Yes Exposed Structure xposed: No r (Subcutaneous Tissue) Exposed: Yes xposed: No xposed: No posed: No osed: No ion in Area: -18.2% ion in Volume: -18.2% alization: Small (1-33%) ng: No ning: No Treatment Notes Wound #24 (Left, Distal, Anterior Lower Leg) 1. Cleanse With Wound Cleanser Soap and water 2. Periwound Care Moisturizing lotion TCA Cream 3. Primary Dressing Applied Iodoflex 4. Secondary Dressing ABD Pad Dry Gauze 6. Support Layer Applied Kerlix/Coban Product manager) Signed: 10/25/2019 4:33:34 PM By: Mikeal Hawthorne EMT/HBOT  Signed: 10/25/2019 5:50:55 PM By: [name] RN Previous Signature: 10/25/2019 7:19:07 AM Version By: [name] RN Entered By:  Mikeal Hawthorne on 10/25/2019 11:15:06 -------------------------------------------------------------------------------- Wound Assessment Details Patient Name: Date of Service: [name] 10/24/2019 8:45 AM Medical Record [id number] Patient Account Number: [id number] Date of Birth/Sex: Treating RN: [birth year] (78 y.o. [name]) [name] Primary Care Iyanni Hepp: [place] Party Other Clinician: Referring Anniah Glick: Treating Onesty Clair/Extender:Robson, Cheryl Flash, [place] Weeks in Treatment: 34 Wound Status Wound Number: 25 Primary Venous Leg Ulcer Etiology: Wound Location: Left Lower Leg - Posterior, Distal Secondary Diabetic Wound/Ulcer of the Lower Extremity Wounding Event: Gradually Appeared Etiology: Date Acquired: 10/04/2019 Wound Open Weeks Of Treatment: 2 Status: Clustered Wound: Yes Comorbid Cataracts, Hypertension, Peripheral Venous History: Disease, Type II Diabetes, Gout, Received Radiation Photos Wound Measurements Length: (cm) 6.8 % Reduct Width: (cm) 3 % Reduct Depth: (cm) 0.1 Epitheli Clustered Quantity: 3 Tunnelin Area: (cm) 16.022 Undermi Volume: (cm) 1.602 Wound Description Full Thickness Without Exposed Support Foul Odo Classification: Structures Slough/F Wound Flat and Intact Margin: Exudate Medium Amount: Exudate Serosanguineous Type: Exudate red, brown Color: Wound Bed Granulation Amount: Medium (34-66%) Granulation Quality: Pink Fascia E Necrotic Amount: Medium (34-66%) Fat Laye Necrotic Quality: Adherent Slough Tendon E Muscle E Joint Ex Bone Exp r After Cleansing: No ibrino Yes Exposed Structure xposed: No r (Subcutaneous Tissue) Exposed: Yes xposed: No xposed: No posed: No osed: No ion in Area: 35.2% ion in Volume: 35.2% alization: Medium (34-66%) g: No ning: No Treatment Notes Wound #25 (Left, Distal, Posterior Lower Leg) 1. Cleanse With Wound Cleanser Soap and water 2. Periwound Care Moisturizing  lotion TCA Cream 3. Primary Dressing Applied Iodoflex 4. Secondary Dressing ABD Pad Dry Gauze 6. Support Layer Psychiatric nurse) Signed: 10/25/2019 4:33:34 PM By: Mikeal Hawthorne EMT/HBOT Signed: 10/25/2019 5:50:55 PM By: [name] RN Previous Signature: 10/25/2019 7:19:07 AM Version By: [name] RN Entered By: Mikeal Hawthorne on 10/25/2019 11:20:17 -------------------------------------------------------------------------------- Wound Assessment Details Patient Name: Date of Service: JOWEL, WALTNER 10/24/2019 8:45 AM Medical Record [id number] Patient Account Number: [id number] Date of Birth/Sex: Treating RN: [birth year] (78 y.o. [name]) [name] Primary Care Eran Windish: [place] Party Other Clinician: Referring Massiel Stipp: Treating Jairon Ripberger/Extender:Robson, Cheryl Flash, [place] Weeks in Treatment: 34 Wound Status Wound Number: 26 Primary Venous Leg Ulcer Etiology: Wound Location: Left Lower Leg - Medial Secondary Diabetic Wound/Ulcer of the Lower Extremity Wounding Event: Gradually Appeared Etiology: Date Acquired: 10/04/2019 Wound Open Weeks Of Treatment: 2 Status: Clustered Wound: No Comorbid Cataracts, Hypertension, Peripheral Venous History: Disease, Type II Diabetes, Gout, Received Radiation Photos Wound Measurements Length: (cm) 1.4 % Reduct Width: (cm) 0.6 % Reduct Depth: (cm) 0.1 Epitheli Area: (cm) 0.66 Tunneli Volume: (cm) 0.066 Wound Description Full Thickness Without Exposed Support Foul Odo Classification: Structures Slough/F Wound Flat and Intact Margin: Exudate Medium Amount: Exudate Serosanguineous Type: Exudate red, brown Color: Wound Bed Granulation Amount: Large (67-100%) Granulation Quality: Red Fascia E Necrotic Amount: Small (1-33%) Fat Laye Necrotic Quality: Adherent Slough Tendon E Muscle E Joint Ex Bone Exp r After Cleansing: No ibrino Yes Exposed Structure xposed: No r  (Subcutaneous Tissue) Exposed: Yes xposed: No xposed: No posed: No osed: No ion in Area: 29.4% ion in Volume: 29% alization: Medium (34-66%) ng: No Treatment Notes Wound #26 (Left, Medial Lower Leg) 1. Cleanse With Wound Cleanser Soap and water 2. Periwound Care Moisturizing lotion TCA Cream 3. Primary Dressing Applied Iodoflex 4. Secondary Dressing ABD Pad Dry Gauze 6. Support Layer Applied Kerlix/Coban [employer]  Signature(s) Signed: 10/25/2019 4:33:34 PM By: Mikeal Hawthorne EMT/HBOT Signed: 10/25/2019 5:50:55 PM By: [name] RN Previous Signature: 10/25/2019 7:19:07 AM Version By: [name] RN Entered By: Mikeal Hawthorne on 10/25/2019 11:09:39 -------------------------------------------------------------------------------- Wound Assessment Details Patient Name: Date of Service: SHONDELL, POULSON 10/24/2019 8:45 AM Medical Record [id number] Patient Account Number: [id number] Date of Birth/Sex: Treating RN: April 06, 1941 (78 y.o. [name]) Dolores Lory, Morey Hummingbird Primary Care Alecsander Hattabaugh: [place] Party Other Clinician: Referring Janei Scheff: Treating Harrol Novello/Extender:Robson, Cheryl Flash, [place] Weeks in Treatment: 34 Wound Status Wound Number: 27 Primary Venous Leg Ulcer Etiology: Wound Location: Left Lower Leg - Lateral, Proximal Secondary Diabetic Wound/Ulcer of the Lower Extremity Wounding Event: Gradually Appeared Etiology: Date Acquired: 10/04/2019 Wound Healed - Epithelialized Weeks Of Treatment: 2 Status: Clustered Wound: No Comorbid Cataracts, Hypertension, Peripheral Venous History: Disease, Type II Diabetes, Gout, Received Radiation Photos Wound Measurements Length: (cm) 0 % Reduct Width: (cm) 0 % Reduct Depth: (cm) 0 Epitheli Area: (cm) 0 Tunneli Volume: (cm) 0 Undermi Wound Description Full Thickness Without Exposed Support Foul Odo Classification: Structures Slough/F Wound Flat and Intact Margin: Exudate None Present Amount: Wound  Bed Granulation Amount: None Present (0%) Necrotic Amount: None Present (0%) Fascia E Fat Laye Tendon E Muscle E Joint Ex Bone Exp r After Cleansing: No ibrino No Exposed Structure xposed: No r (Subcutaneous Tissue) Exposed: No xposed: No xposed: No posed: No osed: No ion in Area: 100% ion in Volume: 100% alization: Large (67-100%) ng: No ning: No Electronic Signature(s) Signed: 10/25/2019 4:33:34 PM By: Mikeal Hawthorne EMT/HBOT Signed: 10/25/2019 5:50:55 PM By: [name] RN Previous Signature: 10/25/2019 7:19:07 AM Version By: [name] RN Entered By: Mikeal Hawthorne on 10/25/2019 11:15:42 -------------------------------------------------------------------------------- Wound Assessment Details Patient Name: Date of Service: [name] 10/24/2019 8:45 AM Medical Record [id number] Patient Account Number: [id number] Date of Birth/Sex: Treating RN: 11-Apr-1941 (78 y.o. [name]) Dolores Lory, Morey Hummingbird Primary Care Torian Quintero: [place] Party Other Clinician: Referring Nguyen Todorov: Treating Averleigh Savary/Extender:Robson, Cheryl Flash, [place] Weeks in Treatment: 34 Wound Status Wound Number: [id number] Primary Diabetic Wound/Ulcer of the Lower Extremity Etiology: Etiology: Wound Location: Right Calf - Posterior Wound Open Wounding Event: Gradually Appeared Status: Date Acquired: 02/28/2019 Comorbid Cataracts, Hypertension, Peripheral Venous Weeks Of Treatment: 34 History: Disease, Type II Diabetes, Gout, Received Clustered Wound: No Radiation Photos Wound Measurements Length: (cm) 1.1 % Reduction i Width: (cm) 0.8 % Reduction i Depth: (cm) 0.1 Epithelializa Area: (cm) 0.691 Tunneling: Volume: (cm) 0.069 Undermining: Wound Description Classification: Grade 2 Foul Odor Aft Wound Margin: Distinct, outline attached Slough/Fibrin Exudate Amount: Medium Exudate Type: Serosanguineous Exudate Color: red, brown Wound Bed Granulation Amount: Large (67-100%) Granulation Quality:  Pink Fascia Expose Necrotic Amount: Small (1-33%) Fat Layer (Su Necrotic Quality: Adherent Slough Tendon Expose Muscle Expose Joint Exposed Bone Exposed: er Cleansing: No o No Exposed Structure d: No bcutaneous Tissue) Exposed: Yes d: No d: No : No No n Area: 97.2% n Volume: 97.2% tion: Medium (34-66%) No No Treatment Notes Wound #3R (Right, Posterior Calf) 1. Cleanse With Wound Cleanser Soap and water 2. Periwound Care Moisturizing lotion TCA Cream 3. Primary Dressing Applied Iodoflex 4. Secondary Dressing ABD Pad Dry Gauze 6. Support Layer Applied Kerlix/Coban Product manager) Signed: 10/25/2019 4:33:34 PM By: Mikeal Hawthorne EMT/HBOT Signed: 10/25/2019 5:50:55 PM By: [name] RN Previous Signature: 10/25/2019 7:19:07 AM Version By: [name] RN Entered By: Mikeal Hawthorne on 10/25/2019 11:01:57 -------------------------------------------------------------------------------- Vitals Details Patient Name: Date of Service: [name] 10/24/2019 8:45 AM Medical Record [id number] Patient Account Number: [id number] Date of  Birth/Sex: Treating RN: 1940/10/25 (79 y.o. [name] Primary Care Katerra Ingman: [place] Party Other Clinician: Referring Ahley Bulls: Treating Jaleiyah Alas/Extender:Robson, Cheryl Flash, [place] Weeks in Treatment: 70 Vital Signs Time Taken: 09:00 Temperature (F): 97.5 Height (in): 74 Pulse (bpm): 75 Weight (lbs): 212 Respiratory Rate (breaths/min): 20 Body Mass Index (BMI): 27.2 Blood Pressure (mmHg): 95/71 Reference Range: 80 - 120 mg / dl Electronic Signature(s) Signed: 10/30/2019 6:39:44 PM By: Levan Hurst RN, BSN Entered By: Levan Hurst on 10/24/2019 09:02:37

## 2019-10-31 ENCOUNTER — Encounter (HOSPITAL_BASED_OUTPATIENT_CLINIC_OR_DEPARTMENT_OTHER): Payer: Medicare HMO | Admitting: Internal Medicine

## 2019-10-31 ENCOUNTER — Other Ambulatory Visit: Payer: Self-pay

## 2019-10-31 DIAGNOSIS — E1142 Type 2 diabetes mellitus with diabetic polyneuropathy: Secondary | ICD-10-CM | POA: Diagnosis not present

## 2019-10-31 DIAGNOSIS — M109 Gout, unspecified: Secondary | ICD-10-CM | POA: Insufficient documentation

## 2019-10-31 DIAGNOSIS — E11621 Type 2 diabetes mellitus with foot ulcer: Secondary | ICD-10-CM | POA: Insufficient documentation

## 2019-10-31 DIAGNOSIS — E1169 Type 2 diabetes mellitus with other specified complication: Secondary | ICD-10-CM | POA: Diagnosis not present

## 2019-10-31 DIAGNOSIS — I872 Venous insufficiency (chronic) (peripheral): Secondary | ICD-10-CM | POA: Diagnosis not present

## 2019-10-31 DIAGNOSIS — L97524 Non-pressure chronic ulcer of other part of left foot with necrosis of bone: Secondary | ICD-10-CM | POA: Diagnosis not present

## 2019-10-31 DIAGNOSIS — L97512 Non-pressure chronic ulcer of other part of right foot with fat layer exposed: Secondary | ICD-10-CM | POA: Diagnosis not present

## 2019-10-31 DIAGNOSIS — I5022 Chronic systolic (congestive) heart failure: Secondary | ICD-10-CM | POA: Insufficient documentation

## 2019-10-31 DIAGNOSIS — E785 Hyperlipidemia, unspecified: Secondary | ICD-10-CM | POA: Diagnosis not present

## 2019-10-31 DIAGNOSIS — I890 Lymphedema, not elsewhere classified: Secondary | ICD-10-CM | POA: Diagnosis not present

## 2019-10-31 DIAGNOSIS — Z7901 Long term (current) use of anticoagulants: Secondary | ICD-10-CM | POA: Insufficient documentation

## 2019-10-31 DIAGNOSIS — L97511 Non-pressure chronic ulcer of other part of right foot limited to breakdown of skin: Secondary | ICD-10-CM | POA: Insufficient documentation

## 2019-10-31 DIAGNOSIS — L97522 Non-pressure chronic ulcer of other part of left foot with fat layer exposed: Secondary | ICD-10-CM | POA: Diagnosis not present

## 2019-10-31 DIAGNOSIS — I87333 Chronic venous hypertension (idiopathic) with ulcer and inflammation of bilateral lower extremity: Secondary | ICD-10-CM | POA: Insufficient documentation

## 2019-10-31 DIAGNOSIS — I87312 Chronic venous hypertension (idiopathic) with ulcer of left lower extremity: Secondary | ICD-10-CM | POA: Diagnosis not present

## 2019-10-31 DIAGNOSIS — L97221 Non-pressure chronic ulcer of left calf limited to breakdown of skin: Secondary | ICD-10-CM | POA: Insufficient documentation

## 2019-10-31 DIAGNOSIS — M869 Osteomyelitis, unspecified: Secondary | ICD-10-CM | POA: Diagnosis not present

## 2019-10-31 DIAGNOSIS — Z8546 Personal history of malignant neoplasm of prostate: Secondary | ICD-10-CM | POA: Diagnosis not present

## 2019-10-31 DIAGNOSIS — I429 Cardiomyopathy, unspecified: Secondary | ICD-10-CM | POA: Insufficient documentation

## 2019-10-31 DIAGNOSIS — I110 Hypertensive heart disease with heart failure: Secondary | ICD-10-CM | POA: Diagnosis not present

## 2019-10-31 DIAGNOSIS — L97211 Non-pressure chronic ulcer of right calf limited to breakdown of skin: Secondary | ICD-10-CM | POA: Diagnosis not present

## 2019-10-31 DIAGNOSIS — Z881 Allergy status to other antibiotic agents status: Secondary | ICD-10-CM | POA: Diagnosis not present

## 2019-10-31 DIAGNOSIS — L97822 Non-pressure chronic ulcer of other part of left lower leg with fat layer exposed: Secondary | ICD-10-CM | POA: Diagnosis not present

## 2019-10-31 DIAGNOSIS — E1151 Type 2 diabetes mellitus with diabetic peripheral angiopathy without gangrene: Secondary | ICD-10-CM | POA: Diagnosis not present

## 2019-10-31 DIAGNOSIS — I482 Chronic atrial fibrillation, unspecified: Secondary | ICD-10-CM | POA: Insufficient documentation

## 2019-10-31 DIAGNOSIS — L03116 Cellulitis of left lower limb: Secondary | ICD-10-CM | POA: Diagnosis not present

## 2019-10-31 NOTE — Progress Notes (Signed)
[name], [name] ([id number]) Visit Report for 10/31/2019 Debridement Details Patient Name: Date of Service: ERCOLE, GEORG 10/31/2019 8:15 AM Medical Record [place] Patient Account Number: [id number] Date of Birth/Sex: Treating RN: 1941-04-04 (79 y.o. [name]) [name] Primary Care Provider: Hurshel Party Other Clinician: Referring Provider: Treating Provider/Extender:Anis Cinelli, Cheryl Flash, [facility] Weeks in Treatment: 35 Debridement Performed for Wound #13 Left Toe Second Assessment: Performed By: Physician Ricard Dillon., MD Debridement Type: Debridement Severity of Tissue Pre Fat layer exposed Debridement: Level of Consciousness (Pre- Awake and Alert procedure): Pre-procedure Verification/Time Out Taken: Yes - 09:39 Start Time: 09:39 Pain Control: Other : benzocaine 20% Total Area Debrided (L x W): 1.8 (cm) x 1.5 (cm) = 2.7 (cm) Tissue and other material Viable, Non-Viable, Slough, Subcutaneous, Skin: Dermis , Skin: Epidermis, Slough debrided: Level: Skin/Subcutaneous Tissue Debridement Description: Excisional Instrument: Curette Bleeding: Moderate Hemostasis Achieved: Pressure End Time: 09:43 Procedural Pain: 3 Post Procedural Pain: 2 Response to Treatment: Procedure was tolerated well Level of Consciousness Awake and Alert (Post-procedure): Post Debridement Measurements of Total Wound Length: (cm) 1.8 Width: (cm) 1.5 Depth: (cm) 0.8 Volume: (cm) 1.696 Character of Wound/Ulcer Post Improved Debridement: Severity of Tissue Post Debridement: Fat layer exposed Post Procedure Diagnosis Same as Pre-procedure Electronic Signature(s) Signed: 10/31/2019 5:22:39 PM By: [name] MD Signed: 10/31/2019 5:23:18 PM By: [name] RN Entered By: [name] on 10/31/2019 11:02:15 -------------------------------------------------------------------------------- Debridement Details Patient Name: Date of Service: Daniel Pia D. 10/31/2019 8:15  AM Medical Record [id number] Patient Account Number: [id number] Date of Birth/Sex: Treating RN: [birth year] (79 y.o. [name]) [name] Primary Care Provider: Hurshel Party Other Clinician: Referring Provider: Treating Provider/Extender:Kamoni Gentles, Cheryl Flash, [place] Weeks in Treatment: 35 Debridement Performed for Wound #19 Left,Anterior Lower Leg Assessment: Performed By: Physician Ricard Dillon., MD Debridement Type: Debridement Severity of Tissue Pre Fat layer exposed Debridement: Level of Consciousness (Pre- Awake and Alert procedure): Pre-procedure Yes - 09:39 Verification/Time Out Taken: Start Time: 09:39 Pain Control: Other : benzocaine 20% Total Area Debrided (L x W): 1.5 (cm) x 1 (cm) = 1.5 (cm) Tissue and other material Viable, Non-Viable, Slough, Subcutaneous, Skin: Dermis , Skin: Epidermis, Slough debrided: Level: Skin/Subcutaneous Tissue Debridement Description: Excisional Instrument: Curette Bleeding: Moderate Hemostasis Achieved: Pressure End Time: 09:41 Procedural Pain: 3 Post Procedural Pain: 2 Response to Treatment: Procedure was tolerated well Level of Consciousness Awake and Alert (Post-procedure): Post Debridement Measurements of Total Wound Length: (cm) 2.5 Width: (cm) 1.5 Depth: (cm) 0.1 Volume: (cm) 0.295 Character of Wound/Ulcer Post Improved Debridement: Severity of Tissue Post Debridement: Fat layer exposed Post Procedure Diagnosis Same as Pre-procedure Electronic Signature(s) Signed: 10/31/2019 5:22:39 PM By: [name] MD Signed: 10/31/2019 5:23:18 PM By: [name] RN Entered By: [name] on 10/31/2019 11:02:32 -------------------------------------------------------------------------------- Debridement Details Patient Name: Date of Service: Daniel Pia D. 10/31/2019 8:15 AM Medical Record [id number] Patient Account Number: [id number] Date of Birth/Sex: Treating RN: Feb 21, 1941 (79 y.o. [name]) [name] Primary Care Provider: Hurshel Party Other Clinician: Referring Provider: Treating Provider/Extender:Eliora Nienhuis, Cheryl Flash, [place] Weeks in Treatment: 35 Debridement Performed for Wound #21 Right Toe Second Assessment: Performed By: Physician Ricard Dillon., MD Debridement Type: Debridement Severity of Tissue Pre Fat layer exposed Debridement: Level of Consciousness (Pre- Awake and Alert procedure): Pre-procedure Verification/Time Out Taken: Yes - 09:39 Start Time: 09:39 Pain Control: Other : benzocaine 20% Total Area Debrided (L x W): 1.2 (cm) x 0.8 (cm) = 0.96 (cm) Tissue and other material Viable, Non-Viable, Slough, Subcutaneous, Skin: Dermis , Skin: Epidermis, Slough debrided: Level:  Skin/Subcutaneous Tissue Debridement Description: Excisional Instrument: Curette Bleeding: Moderate Hemostasis Achieved: Pressure End Time: 09:41 Procedural Pain: 3 Post Procedural Pain: 2 Response to Treatment: Procedure was tolerated well Level of Consciousness Awake and Alert (Post-procedure): Post Debridement Measurements of Total Wound Length: (cm) 1.2 Width: (cm) 0.8 Depth: (cm) 0.1 Volume: (cm) 0.075 Character of Wound/Ulcer Post Improved Debridement: Severity of Tissue Post Debridement: Fat layer exposed Post Procedure Diagnosis Same as Pre-procedure Electronic Signature(s) Signed: 10/31/2019 5:22:39 PM By: [name] MD Signed: 10/31/2019 5:23:18 PM By: [name] RN Entered By: [name] on 10/31/2019 11:02:47 -------------------------------------------------------------------------------- HPI Details Patient Name: Date of Service: Daniel Pia D. 10/31/2019 8:15 AM Medical Record [id number] Patient Account Number: [id number] Date of Birth/Sex: Treating RN: [birth year] (79 y.o. [name] Primary Care Provider: Hurshel Party Other Clinician: Referring Provider: Treating Provider/Extender:Kenyada Hy, Cheryl Flash,  [place] Weeks in Treatment: 35 History of Present Illness Location: Patient presents with a wound to left lower leg. Quality: Patient reports No Pain. Duration: 2 months HPI Description: no cig or alcohol. spontaneous appearance in area of stasis dermamtitis. [name] on metformin only. chronic afib on Coumadin. diabetes and coag studies not good. hba1c 7.5. ivr 4.5. no pain or sxs of systemic disease. hx chf. no intermittent claudication 02/28/2019 Readmission This is a now a 79 year old man who was previously cared for in 2016 by Dr. Lindon Romp for wounds on his lower extremities. At that point he had venous reflux studies although I cannot seem to open these in [place] link. He had arterial studies showing an ABI of 1.11 on the right and 1.27 on the left his waveforms were triphasic bilaterally. He was discharged in stockings although I do not believe he is wearing these in some time. He tells me that about a month ago he noted openings of a large wound on the posterior right calf and 2 smaller areas on the left lateral calf and a small area more recently on the left posterior calf. He has been dressing these with peroxide and triple antibiotic ointment. He is not wearing compression. Past medical history; type 2 diabetes with peripheral neuropathy, chronic venous insufficiency, hypertension, cardiomyopathy, chronic atrial fibrillation on Coumadin, prostate cancer, hyperlipidemia, gout, ABI in our clinic was 1.34 on the left and not obtainable on the right 6/9; this is a patient who has chronic venous insufficiency. He has a fairly substantial area on the right posterior calf, left lateral calf and a small area on the left posterior calf. On arrival last week he had very palpable popliteal and femoral pulses but nothing in his bilateral feet. Unfortunately we cannot get arterial studies until July 1 at Dr. Kennon Holter office. They live in [place]. We use silver alginate under Kerlix Coban 6/16;  patient with chronic venous insufficiency with wounds on his bilateral lower extremities. When he came into our clinic he was discovered to have a complete absence of peripheral pedal pulses at either the dorsalis pedis or posterior tibial. He does have easily palpable femoral and popliteal pulses. He sees Dr. [name] tomorrow for noninvasive arterial tests. He may also require venous reflux evaluation although I do not view this as an urgent thing. We have been using silver alginate. His wound surfaces of cleaned up quite nicely 6/23; patient with chronic venous insufficiency with wounds on his bilateral lower extremities. His wounds all are somewhat better looking. He did go to Dr. Kennon Holter office but somehow ended up on the doctors schedule rather than being scheduled for noninvasive tests therefore his  noninvasive tests are scheduled for July 1. We agree that he has venous insufficiency ulcers but I cannot feel any pulses in his lower extremities dictating the need for test. We are only using Kerlix and light Coban unfortunately this appears to be holding the edema 6/30; has his arterial studies tomorrow. We have been using Kerlix and light Coban will go to a more aggressive compression if the arterial studies will allow. We all agreed these are venous wounds however I cannot feel pulses at either the dorsalis pedis or posterior tibial bilaterally. His wounds generally look some better including left lateral and right posterior. 7/7-Patient returns at 1 week in Kerlix/Coban to both legs, with improvement, in the left lateral and right posterior lower leg wounds, ABI's are normal in both legs per vascular studies, TBI is also normal on both sides, we are using hydrofera blue to the wounds 7/14; patient's arterial studies from 2 weeks ago showed an ABI on the right at 1.03 with a TBI of 0.86. On the left the ABI was 1.06 with a TBI of 0.84. Notable for the fact that his arterial waveforms were  monophasic in all of the lower extremity arteries suggesting some degree of arterial occlusive disease but in general this was felt to be fairly adequate for healing. His compression was increased from 2-3 layer which is appropriate. Dressing was changed to [facility] 7/21; patient's wounds are measuring smaller. The more substantial one on the right posterior calf, second 1 on the left lateral calf. Using [facility] on both wound areas 7/28; patient continues to make nice improvements. The area on the right posterior calf is smaller. Area on the left lateral calf also is smaller. We have been using Hydrofera Blue under compression. The patient will need compression stockings and we have measured him for these in the eventuality that these heal which really should not be too long from now 8/4-Patient continues to make improvement, the right posterior calf area smaller with rim of keratotic skin on one side, the left wound is definitely smaller and improving. 8/11-Returns at 1 week, after being in 3 layer compression on both legs, both wounds appear to be improving, making good progress, patient is happy, pain is also less especially in the right leg wound 8/18; the area on the left anterior lower leg is healed. On the right posterior leg the wound remains although the dimensions are a lot better. 8/25; he arrives in clinic today with a large body of open wound on the left lateral calf. All of the 3 wounds in this area are in close juxtaposition to each other. The story is that we discharged him last week with no a wrap on the left leg. They went to [facility] could not get in as they are only excepting phone orders or online orders for stockings hence they did not put any stocking on the left leg all week. They have something at home but the patient with that was either incapable or just did not put them on. Apparently these opened 1 morning after getting out of bed. The area on the right  has no real change 9/1; patient has bilateral lower extremity wounds in the setting of severe chronic venous insufficiency and secondary lymphedema. He arrived last week with new areas on the left lateral lower leg after we did not wrap him and he did not use his stockings. Nevertheless the areas on the left look better today under compression. Posterior right calf does not really  changed. We are using Hydrofera Blue on both areas under compression 9/15; bilateral lower extremity wounds in the setting of severe chronic venous insufficiency and secondary lymphedema. He has 20 to 30 mmHg below-knee compression stockings under the eventuality that these close over. We did get the left leg to close but he did not transition to a stocking and this reopened. There are 2 open areas on the left posterior lateral calf and one on the right. Both of these look satisfactory. Using [facility] 9/22; bilateral lower extremity wounds in the setting of chronic venous insufficiency. 2 superficial areas on the left lateral calf. One on the right just above the Achilles area. We have good edema control we have been using Hydrofera Blue 9/29; the areas on the left lateral calf are healed. On the right just above the Achilles and tendon area things look a lot better small wounds one scabbed area. We have been using Hydrofera Blue. We can discharge him in his own stocking on the left still wrapping on the right. This is the second time we have healed the left leg but he did not put a stocking on last time. Hopefully this will maintain the edema from chronic venous disease with secondary lymphedema 10/6; he comes in today having a stocking on the left leg. They had trouble getting it on there is a lot of increase in swelling 2 small open areas one anteriorly and one on the medial calf. They report a lot of difficulty getting the stocking on. Paradoxically the area on the right that we have been wrapping posteriorly  is closed 10/13; he comes in today with wounds bilaterally including superficial areas on the left medial and left lateral calf. As well as the right posterior has reopened in the Achilles area superiorly. He still does not have his juxta lite stockings although truthfully we would not of been able to use them today anyway. Apparently have been ordered and paid for from [employer] although they have not been delivered 10/20; his area on the right is just the boat closed on the right posterior. Still has the area on the left lateral and a very tiny area on the left medial. He has his bilateral juxta lites although he is not ready for them this week. He tolerated the increase to 4 layer compression last week quite well 10/27; the area on the right posterior calf is once again closed. He has a superficial area on the left lateral calf that is still open. He has been using Hydrofera Blue and bilateral 4-layer compression. He can change to his own juxta lite stocking on the right and we are instructing him today 11/3; the area on the right posterior calf reopened according to the patient and his wife after they took off the stocking when they got home last week. Apparently scabbed over there is now a fairly substantial wound which looks pretty much the same. Our intake nurse noted that they were using the juxta lite stockings appropriately. I was really hoping I might be able to close him out today. He has 1 very tiny remaining area on the left lateral lower leg. 11/10; right posterior calf wound measures smaller but is still open. We have been using Hydrofera Blue. On the left he has a small oval-shaped wound and he seems to have had another wound distally that is open and likely a blister. We are using Hydrofera Blue under compression 11/17; right posterior calf wound continues to get better. We have  been using Hydrofera Blue. On the left lateral one of the wounds has closed still a small open area. We  have been using Hydrofera Blue on this as well. Both areas have been under 4-layer compression Arrives in clinic today with some swelling in the dorsal foot on the right some erythema of his forefoot and toes. Initially when I looked at this I almost thought this was a sunburn distal to a wrap injury. 12/1; right posterior calf wound debrided with a curette. We have been using Hydrofera Blue on the left anterior lateral he has an area across the mid tibia. Finally a small area on the left lateral lower calf. Finally he continues to have de-epithelialized areas on the dorsal aspect of his toes. Initially thought this might be a burn injury when I saw him 2 weeks ago. I now wonder about tinea. I have also reviewed his arterial studies which were really quite good in July/20 with normal TBI's and ABIs but monophasic waveforms 12/8; comes in today with worsening problems especially on the left leg where he now has a cluster of wounds in the left anterior mid tibia. Very poor edema control. I reduced him to 3 layer from 4 layer compression last week because of some concern about blood flow to his toes however he does not have good edema control on the left leg. Right leg edema control looks satisfactory. On the left he has a cluster of wounds anteriorly, small area on the left medial fifth met head and then the collection of areas on his toes which appear better On the right he has the original area on the right posterior calf, a new area right medially. His formal arterial studies from mid July are noted below. He was evaluated by Dr.Berry ABI Findings: +---------+------------------+-----+----------+--------+ Right Rt Pressure (mmHg)IndexWaveform Comment  +---------+------------------+-----+----------+--------+ Brachial 176     +---------+------------------+-----+----------+--------+ ATA 176 0.99 monophasic  +---------+------------------+-----+----------+--------+ PTA 183  1.03 monophasic  +---------+------------------+-----+----------+--------+ PERO 172 0.97 monophasic  +---------+------------------+-----+----------+--------+ Great Toe153 0.86    +---------+------------------+-----+----------+--------+ +---------+------------------+-----+-----------+-------+ Left Lt Pressure (mmHg)IndexWaveform Comment +---------+------------------+-----+-----------+-------+ Brachial 178     +---------+------------------+-----+-----------+-------+ ATA 162 0.91 multiphasic  +---------+------------------+-----+-----------+-------+ PTA 188 1.06 multiphasic  +---------+------------------+-----+-----------+-------+ PERO 158 0.89 monophasic   +---------+------------------+-----+-----------+-------+ Great Toe150 0.84    +---------+------------------+-----+-----------+-------+ +-------+-----------+-----------+------------+------------+ ABI/TBIToday's ABIToday's TBIPrevious ABIPrevious TBI +-------+-----------+-----------+------------+------------+ Right 1.03 0.86 1.11   +-------+-----------+-----------+------------+------------+ Left 1.06 0.84 1.27   +-------+-----------+-----------+------------+------------+ Tibial waveforms somewhat difficult to record due to irregular heartbeat. Bilateral ABIs appear essentially unchanged compared to prior study on 06/21/15. Summary: Right: Resting right ankle-brachial index is within normal range. No evidence of significant right lower extremity arterial disease. The right toe-brachial index is normal. Although ankle brachial indices are within normal limits (0.95-1.29), arterial Doppler waveforms at the ankle suggest some component of arterial occlusive disease. Left: Resting left ankle-brachial index is within normal range. No evidence of significant left lower extremity arterial disease. The left toe-brachial index is normal. Although ankle brachial indices are  within normal limits (0.95-1.29), arterial Doppler waveforms at the ankle suggest some component of arterial occlusive disease. 12/15; the patient's area on the left mid tibia looks better. Right posterior calf also better. He has the area on the left foot as well. All of his toes look better I think this was tinea. We are using Hydrofera Blue everywhere else The patient was in urgent care yesterday with wheezing and shortness of breath. He got azithromycin and prednisone. He feels better. His lungs are currently clear to auscultation. He was not tested for  Covid 19 12/29; the patient arrives in clinic today with quite a bit change. 2 weeks ago he only had areas on the left mid tibia right posterior calf with tinea pedis resolving between his toes. He arrives in clinic today with several areas on the dorsal toes on the right, dorsal left second toe. He has skin breakdown in the left medial calf probably from excess edema. Small area proximally in the medial calf. He has weeping edema fluid coming out of the skin excoriations on the left medial calf. He tells me that he is having a cardiac catheterization next week. I had a quick look at [facility] health link. He was found to have an ejection fraction of 25% during the work-up for persistent atrial fibrillation. He saw his cardiology office yesterday seen by the nurse practitioner. She increased his carvedilol. He has not been on diuretics for apparently several months and indeed in the nurse practitioner Dietrich Pates notes she had knowledge of this. 10/04/2019 on evaluation today patient presents as a walk-in visit concerning the fact that he did not have an appointment here for our clinic at this point. He actually had a cardiac catheterization earlier today and then came from there to here in order to be evaluated. With that being said unfortunately he is having significant cellulitis of his left lower extremity upon evaluation today this appears to  have deteriorated even since last week's evaluation with Dr. Dellia Nims. The right lower extremity is actually doing okay I really see no evidence of deterioration at this point at those locations. In fact the right leg seems in general be doing quite well. Nonetheless I am concerned about infection and cellulitis of the left lower extremity and again considering his weakened heart I do not want him to develop into sepsis at all. He is also having some trouble breathing today and I understand according to nursing staff this is always the case to some degree. With that being said the patient unfortunately seems to be in my opinion a little bit worse even his wife feels like that may be the case today. Unsure exactly what is leading to this. Cardiac catheterization I did review the report which showed an ejection fraction of 25% he also had an LAD blockage of around 25% based on what I saw. With that being said there was no significant blockages that required stenting at this point he does have weakened cardiac muscles compared to normal. 10/05/2019; patient was seen yesterday in clinic. He was sent to the ER because of cellulitis of the left leg possibility. In the ER he was given 1 dose of IV Levaquin and discharged on Keflex. He came in the clinic initially for a nurse visit to rewrap his left leg. We did not look at the right leg today that is an Haematologist. The patient also had a cardiac cath. According to him there were no blockages but a very low ejection fraction. 1/12; back for an early follow-up. The condition of the left lower leg is a lot better although there are multiple open areas. All of them with not a particularly viable surface. On the right posterior calf he has a single wound with a clean surface. He has a wound with exposed bone on the PIP of the left second toe dorsally. He has wounds on the dorsal right first second and third toes. His arterial studies were normal. 1/19; the patient  has 3 open wounds on the left leg anteriorly in the mid  tibia, distally and medially just above the ankle and posteriorly. On the right he has a small area dime sized on the right posterior calf. His edema control is a lot better. He still has wounds on the right second and left second toes. The left second toe has exposed bone. X- ray I did last week did not show evidence of osteomyelitis in the left foot. 1/26; patient with a multiplicity of wounds and problems. On the left he has a circular area on the left anterior mid tibia Left just above the medical ankle -left 2nd toe pip -right 2nd toe right posterior calf 2/2; patient with a multiplicity of wounds and problems. He has severe chronic venous insufficiency and the wounds on his leg are all on the left left anterior left medial at the medial malleolus and left posterior. We have been using Iodoflex to these areas to help with ongoing debridement. He also has largely traumatic wounds on his toes this includes the left second with exposed bone. Bone culture I did last week showed Staph aureus which is methicillin sensitive. I discussed with him today the idea of an amputation of this toe because it is literally nonfunctional however he wants to try antibiotics. Antibiotic choice is complicated by the fact that he is on Coumadin. He also has wounds over the dorsal part of the right first which is close to closed. Right second toe has exposed bone and the right third toe at the base of the right third toe is just about closed as well. We have been using silver alginate Electronic Signature(s) Signed: 10/31/2019 5:22:39 PM By: [name] MD Entered By: [name] on 10/31/2019 11:04:59 -------------------------------------------------------------------------------- Physical Exam Details Patient Name: Date of Service: IZAK, ANDING 10/31/2019 8:15 AM Medical Record [id number] Patient Account Number: [id number] Date of  Birth/Sex: Treating RN: 09-13-1941 (79 y.o. [name]) [name] Primary Care Provider: Hurshel Party Other Clinician: Referring Provider: Treating Provider/Extender:Charina Fons, Cheryl Flash, [place] Weeks in Treatment: 35 Constitutional Patient is hypotensive. However he appears well mentating normally. Pulse regular and within target range for patient.Marland Kitchen Respirations regular, non-labored and within target range.. Temperature is normal and within the target range for the patient.Marland Kitchen Appears in no distress. Notes Wound exam Debridement of the left anterior leg wound of surface debrided. This is cleaning up quite nicely. Left posterior calf is also cleaning up. Debris on the left second toe removed from the circumference of the wound. Right second toe necrotic debris over the surface removed to expose bone. Electronic Signature(s) Signed: 10/31/2019 5:22:39 PM By: [name] MD Entered By: [name] on 10/31/2019 11:07:06 -------------------------------------------------------------------------------- Physician Orders Details Patient Name: Date of Service: AIDAN, MOTEN 10/31/2019 8:15 AM Medical Record [id number] Patient Account Number: [id number] Date of Birth/Sex: Treating RN: Jan 03, 1941 (79 y.o. [name]) [name] Primary Care Provider: Hurshel Party Other Clinician: Referring Provider: Treating Provider/Extender:Misty Rago, Cheryl Flash, [place] Weeks in Treatment: 18 Verbal / Phone Orders: No Diagnosis Coding ICD-10 Coding Code Description E11.51 Type 2 diabetes mellitus with diabetic peripheral angiopathy without gangrene L97.211 Non-pressure chronic ulcer of right calf limited to breakdown of skin E11.42 Type 2 diabetes mellitus with diabetic polyneuropathy L97.221 Non-pressure chronic ulcer of left calf limited to breakdown of skin I87.333 Chronic venous hypertension (idiopathic) with ulcer and inflammation of bilateral lower extremity L97.524 Non-pressure  chronic ulcer of other part of left foot with necrosis of bone L97.511 Non-pressure chronic ulcer of other part of right foot limited to breakdown of skin X54.00 Chronic systolic (congestive) heart  failure Follow-up Appointments Return Appointment in 1 week. - Tuesday **********EXTRA TIME**************** Dressing Change Frequency Wound #13 Left Toe Second Change dressing every day. Wound #19 Left,Anterior Lower Leg Do not change entire dressing for one week. Wound #20 Right Toe Great Change dressing every day. Wound #21 Right Toe Second Change dressing every day. Wound #22 Right Toe Third Change dressing every day. Wound #24 Left,Distal,Anterior Lower Leg Do not change entire dressing for one week. Wound #25 Left,Distal,Posterior Lower Leg Do not change entire dressing for one week. Wound #26 Left,Medial Lower Leg Do not change entire dressing for one week. Wound #3R Right,Posterior Calf Do not change entire dressing for one week. Skin Barriers/Peri-Wound Care TCA Cream or Ointment Wound Cleansing May shower with protection. Primary Wound Dressing Wound #13 Left Toe Second Calcium Alginate with Silver Wound #19 Left,Anterior Lower Leg Iodoflex Wound #20 Right Toe Great Calcium Alginate with Silver Wound #21 Right Toe Second Calcium Alginate with Silver Wound #22 Right Toe Third Calcium Alginate with Silver Wound #24 Left,Distal,Anterior Lower Leg Iodoflex Wound #25 Left,Distal,Posterior Lower Leg Iodoflex Wound #26 Left,Medial Lower Leg Iodoflex Wound #3R Right,Posterior Calf Iodoflex Secondary Dressing Dry Gauze - secure toes with tape Edema Control Unna Boots Bilaterally Off-Loading Open toe surgical shoe to: - left and right foot Patient Medications Allergies: clarithromycin, Fluoride Preparations Notifications Medication Indication Start End cephalexin osteomyelitis 10/31/2019 right 2nd toe DOSE oral 500 mg capsule - 1 capsule oral q6h for 2  weeks Electronic Signature(s) Signed: 10/31/2019 11:10:19 AM By: [name] MD Entered By: [name] on 10/31/2019 11:10:14 -------------------------------------------------------------------------------- Problem List Details Patient Name: Date of Service: Daniel Pia D. 10/31/2019 8:15 AM Medical Record [id number] Patient Account Number: [id number] Date of Birth/Sex: Treating RN: [birth year] (79 y.o. [name]) Dolores Lory, Morey Hummingbird Primary Care Provider: Hurshel Party Other Clinician: Referring Provider: Treating Provider/Extender:Onita Pfluger, Cheryl Flash, [place] Weeks in Treatment: 35 Active Problems ICD-10 Evaluated Encounter Code Description Active Date Today Diagnosis E11.51 Type 2 diabetes mellitus with diabetic peripheral 02/28/2019 No Yes angiopathy without gangrene L97.211 Non-pressure chronic ulcer of right calf limited to 02/28/2019 No Yes breakdown of skin E11.42 Type 2 diabetes mellitus with diabetic polyneuropathy 02/28/2019 No Yes L97.221 Non-pressure chronic ulcer of left calf limited to 02/28/2019 No Yes breakdown of skin I87.333 Chronic venous hypertension (idiopathic) with ulcer 02/28/2019 No Yes and inflammation of bilateral lower extremity L97.524 Non-pressure chronic ulcer of other part of left foot 10/10/2019 No Yes with necrosis of bone L97.511 Non-pressure chronic ulcer of other part of right foot 09/26/2019 No Yes limited to breakdown of skin T55.73 Chronic systolic (congestive) heart failure 10/05/2019 No Yes M86.672 Other chronic osteomyelitis, left ankle and foot 10/31/2019 No Yes Inactive Problems ICD-10 Code Description Active Date Inactive Date B35.3 Tinea pedis 09/05/2019 09/05/2019 L97.521 Non-pressure chronic ulcer of other part of left foot limited to 09/26/2019 09/26/2019 breakdown of skin Resolved Problems Electronic Signature(s) Signed: 10/31/2019 5:22:39 PM By: [name] MD Entered By: [name] on 10/31/2019  11:11:12 -------------------------------------------------------------------------------- Progress Note Details Patient Name: Date of Service: Daniel Pia D. 10/31/2019 8:15 AM Medical Record [id number] Patient Account Number: [id number] Date of Birth/Sex: Treating RN: 05-Jun-1941 (79 y.o. [name]) [name] Primary Care Provider: Hurshel Party Other Clinician: Referring Provider: Treating Provider/Extender:Davena Julian, Cheryl Flash, [place] Weeks in Treatment: 35 Subjective History of Present Illness (HPI) The following HPI elements were documented for the patient's wound: Location: Patient presents with a wound to left lower leg. Quality: Patient reports No Pain. Duration: 2 months no cig or alcohol. spontaneous appearance in  area of stasis dermamtitis. [name] on metformin only. chronic afib on Coumadin. diabetes and coag studies not good. hba1c 7.5. ivr 4.5. no pain or sxs of systemic disease. hx chf. no intermittent claudication 02/28/2019 Readmission This is a now a 79 year old man who was previously cared for in 2016 by Dr. Lindon Romp for wounds on his lower extremities. At that point he had venous reflux studies although I cannot seem to open these in [place] link. He had arterial studies showing an ABI of 1.11 on the right and 1.27 on the left his waveforms were triphasic bilaterally. He was discharged in stockings although I do not believe he is wearing these in some time. He tells me that about a month ago he noted openings of a large wound on the posterior right calf and 2 smaller areas on the left lateral calf and a small area more recently on the left posterior calf. He has been dressing these with peroxide and triple antibiotic ointment. He is not wearing compression. Past medical history; type 2 diabetes with peripheral neuropathy, chronic venous insufficiency, hypertension, cardiomyopathy, chronic atrial fibrillation on Coumadin, prostate cancer, hyperlipidemia,  gout, ABI in our clinic was 1.34 on the left and not obtainable on the right 6/9; this is a patient who has chronic venous insufficiency. He has a fairly substantial area on the right posterior calf, left lateral calf and a small area on the left posterior calf. On arrival last week he had very palpable popliteal and femoral pulses but nothing in his bilateral feet. Unfortunately we cannot get arterial studies until July 1 at Dr. Kennon Holter office. They live in [place]. We use silver alginate under Kerlix Coban 6/16; patient with chronic venous insufficiency with wounds on his bilateral lower extremities. When he came into our clinic he was discovered to have a complete absence of peripheral pedal pulses at either the dorsalis pedis or posterior tibial. He does have easily palpable femoral and popliteal pulses. He sees Dr. [name] tomorrow for noninvasive arterial tests. He may also require venous reflux evaluation although I do not view this as an urgent thing. We have been using silver alginate. His wound surfaces of cleaned up quite nicely 6/23; patient with chronic venous insufficiency with wounds on his bilateral lower extremities. His wounds all are somewhat better looking. He did go to Dr. Kennon Holter office but somehow ended up on the doctors schedule rather than being scheduled for noninvasive tests therefore his noninvasive tests are scheduled for July 1. We agree that he has venous insufficiency ulcers but I cannot feel any pulses in his lower extremities dictating the need for test. We are only using Kerlix and light Coban unfortunately this appears to be holding the edema 6/30; has his arterial studies tomorrow. We have been using Kerlix and light Coban will go to a more aggressive compression if the arterial studies will allow. We all agreed these are venous wounds however I cannot feel pulses at either the dorsalis pedis or posterior tibial bilaterally. His wounds generally look some  better including left lateral and right posterior. 7/7-Patient returns at 1 week in Kerlix/Coban to both legs, with improvement, in the left lateral and right posterior lower leg wounds, ABI's are normal in both legs per vascular studies, TBI is also normal on both sides, we are using hydrofera blue to the wounds 7/14; patient's arterial studies from 2 weeks ago showed an ABI on the right at 1.03 with a TBI of 0.86. On the left the ABI was 1.06  with a TBI of 0.84. Notable for the fact that his arterial waveforms were monophasic in all of the lower extremity arteries suggesting some degree of arterial occlusive disease but in general this was felt to be fairly adequate for healing. His compression was increased from 2-3 layer which is appropriate. Dressing was changed to [facility] 7/21; patient's wounds are measuring smaller. The more substantial one on the right posterior calf, second 1 on the left lateral calf. Using [facility] on both wound areas 7/28; patient continues to make nice improvements. The area on the right posterior calf is smaller. Area on the left lateral calf also is smaller. We have been using Hydrofera Blue under compression. The patient will need compression stockings and we have measured him for these in the eventuality that these heal which really should not be too long from now 8/4-Patient continues to make improvement, the right posterior calf area smaller with rim of keratotic skin on one side, the left wound is definitely smaller and improving. 8/11-Returns at 1 week, after being in 3 layer compression on both legs, both wounds appear to be improving, making good progress, patient is happy, pain is also less especially in the right leg wound 8/18; the area on the left anterior lower leg is healed. On the right posterior leg the wound remains although the dimensions are a lot better. 8/25; he arrives in clinic today with a large body of open wound on the left  lateral calf. All of the 3 wounds in this area are in close juxtaposition to each other. The story is that we discharged him last week with no a wrap on the left leg. They went to [place] could not get in as they are only excepting phone orders or online orders for stockings hence they did not put any stocking on the left leg all week. They have something at home but the patient with that was either incapable or just did not put them on. Apparently these opened 1 morning after getting out of bed. The area on the right has no real change 9/1; patient has bilateral lower extremity wounds in the setting of severe chronic venous insufficiency and secondary lymphedema. He arrived last week with new areas on the left lateral lower leg after we did not wrap him and he did not use his stockings. Nevertheless the areas on the left look better today under compression. Posterior right calf does not really changed. We are using Hydrofera Blue on both areas under compression 9/15; bilateral lower extremity wounds in the setting of severe chronic venous insufficiency and secondary lymphedema. He has 20 to 30 mmHg below-knee compression stockings under the eventuality that these close over. We did get the left leg to close but he did not transition to a stocking and this reopened. There are 2 open areas on the left posterior lateral calf and one on the right. Both of these look satisfactory. Using [facility] 9/22; bilateral lower extremity wounds in the setting of chronic venous insufficiency. 2 superficial areas on the left lateral calf. One on the right just above the Achilles area. We have good edema control we have been using Hydrofera Blue 9/29; the areas on the left lateral calf are healed. On the right just above the Achilles and tendon area things look a lot better small wounds one scabbed area. We have been using Hydrofera Blue. We can discharge him in his own stocking on the left still wrapping on  the right. This is  the second time we have healed the left leg but he did not put a stocking on last time. Hopefully this will maintain the edema from chronic venous disease with secondary lymphedema 10/6; he comes in today having a stocking on the left leg. They had trouble getting it on there is a lot of increase in swelling 2 small open areas one anteriorly and one on the medial calf. They report a lot of difficulty getting the stocking on. ooParadoxically the area on the right that we have been wrapping posteriorly is closed 10/13; he comes in today with wounds bilaterally including superficial areas on the left medial and left lateral calf. As well as the right posterior has reopened in the Achilles area superiorly. He still does not have his juxta lite stockings although truthfully we would not of been able to use them today anyway. Apparently have been ordered and paid for from [employer] although they have not been delivered 10/20; his area on the right is just the boat closed on the right posterior. Still has the area on the left lateral and a very tiny area on the left medial. He has his bilateral juxta lites although he is not ready for them this week. He tolerated the increase to 4 layer compression last week quite well 10/27; the area on the right posterior calf is once again closed. He has a superficial area on the left lateral calf that is still open. He has been using Hydrofera Blue and bilateral 4-layer compression. He can change to his own juxta lite stocking on the right and we are instructing him today 11/3; the area on the right posterior calf reopened according to the patient and his wife after they took off the stocking when they got home last week. Apparently scabbed over there is now a fairly substantial wound which looks pretty much the same. Our intake nurse noted that they were using the juxta lite stockings appropriately. I was really hoping I might be able to close him  out today. He has 1 very tiny remaining area on the left lateral lower leg. 11/10; right posterior calf wound measures smaller but is still open. We have been using Hydrofera Blue. On the left he has a small oval-shaped wound and he seems to have had another wound distally that is open and likely a blister. We are using Hydrofera Blue under compression 11/17; right posterior calf wound continues to get better. We have been using Hydrofera Blue. On the left lateral one of the wounds has closed still a small open area. We have been using Hydrofera Blue on this as well. Both areas have been under 4-layer compression Arrives in clinic today with some swelling in the dorsal foot on the right some erythema of his forefoot and toes. Initially when I looked at this I almost thought this was a sunburn distal to a wrap injury. 12/1; right posterior calf wound debrided with a curette. We have been using Hydrofera Blue on the left anterior lateral he has an area across the mid tibia. Finally a small area on the left lateral lower calf. Finally he continues to have de-epithelialized areas on the dorsal aspect of his toes. Initially thought this might be a burn injury when I saw him 2 weeks ago. I now wonder about tinea. I have also reviewed his arterial studies which were really quite good in July/20 with normal TBI's and ABIs but monophasic waveforms 12/8; comes in today with worsening problems especially on the left  leg where he now has a cluster of wounds in the left anterior mid tibia. Very poor edema control. I reduced him to 3 layer from 4 layer compression last week because of some concern about blood flow to his toes however he does not have good edema control on the left leg. Right leg edema control looks satisfactory. ooOn the left he has a cluster of wounds anteriorly, small area on the left medial fifth met head and then the collection of areas on his toes which appear better ooOn the right he  has the original area on the right posterior calf, a new area right medially. His formal arterial studies from mid July are noted below. He was evaluated by Dr.Berry ABI Findings: +---------+------------------+-----+----------+--------+ Right Rt Pressure (mmHg)IndexWaveform Comment  +---------+------------------+-----+----------+--------+ Brachial 176     +---------+------------------+-----+----------+--------+ ATA 176 0.99 monophasic  +---------+------------------+-----+----------+--------+ PTA 183 1.03 monophasic  +---------+------------------+-----+----------+--------+ PERO 172 0.97 monophasic  +---------+------------------+-----+----------+--------+ Great Toe153 0.86    +---------+------------------+-----+----------+--------+ +---------+------------------+-----+-----------+-------+ Left Lt Pressure (mmHg)IndexWaveform Comment +---------+------------------+-----+-----------+-------+ Brachial 178     +---------+------------------+-----+-----------+-------+ ATA 162 0.91 multiphasic  +---------+------------------+-----+-----------+-------+ PTA 188 1.06 multiphasic  +---------+------------------+-----+-----------+-------+ PERO 158 0.89 monophasic   +---------+------------------+-----+-----------+-------+ Great Toe150 0.84    +---------+------------------+-----+-----------+-------+ +-------+-----------+-----------+------------+------------+ ABI/TBIToday's ABIToday's TBIPrevious ABIPrevious TBI +-------+-----------+-----------+------------+------------+ Right 1.03 0.86 1.11   +-------+-----------+-----------+------------+------------+ Left 1.06 0.84 1.27   +-------+-----------+-----------+------------+------------+ Tibial waveforms somewhat difficult to record due to irregular heartbeat. Bilateral ABIs appear essentially unchanged compared to prior study on 06/21/15. Summary: Right: Resting  right ankle-brachial index is within normal range. No evidence of significant right lower extremity arterial disease. The right toe-brachial index is normal. Although ankle brachial indices are within normal limits (0.95-1.29), arterial Doppler waveforms at the ankle suggest some component of arterial occlusive disease. Left: Resting left ankle-brachial index is within normal range. No evidence of significant left lower extremity arterial disease. The left toe-brachial index is normal. Although ankle brachial indices are within normal limits (0.95-1.29), arterial Doppler waveforms at the ankle suggest some component of arterial occlusive disease. 12/15; the patient's area on the left mid tibia looks better. Right posterior calf also better. He has the area on the left foot as well. All of his toes look better I think this was tinea. We are using Hydrofera Blue everywhere else The patient was in urgent care yesterday with wheezing and shortness of breath. He got azithromycin and prednisone. He feels better. His lungs are currently clear to auscultation. He was not tested for Covid 19 12/29; the patient arrives in clinic today with quite a bit change. 2 weeks ago he only had areas on the left mid tibia right posterior calf with tinea pedis resolving between his toes. He arrives in clinic today with several areas on the dorsal toes on the right, dorsal left second toe. He has skin breakdown in the left medial calf probably from excess edema. Small area proximally in the medial calf. He has weeping edema fluid coming out of the skin excoriations on the left medial calf. He tells me that he is having a cardiac catheterization next week. I had a quick look at [facility] health link. He was found to have an ejection fraction of 25% during the work-up for persistent atrial fibrillation. He saw his cardiology office yesterday seen by the nurse practitioner. She increased his carvedilol. He has not been on  diuretics for apparently several months and indeed in the nurse practitioner Dietrich Pates notes she had knowledge of this. 10/04/2019 on evaluation today patient presents as a walk-in visit concerning the  fact that he did not have an appointment here for our clinic at this point. He actually had a cardiac catheterization earlier today and then came from there to here in order to be evaluated. With that being said unfortunately he is having significant cellulitis of his left lower extremity upon evaluation today this appears to have deteriorated even since last week's evaluation with Dr. Dellia Nims. The right lower extremity is actually doing okay I really see no evidence of deterioration at this point at those locations. In fact the right leg seems in general be doing quite well. Nonetheless I am concerned about infection and cellulitis of the left lower extremity and again considering his weakened heart I do not want him to develop into sepsis at all. He is also having some trouble breathing today and I understand according to nursing staff this is always the case to some degree. With that being said the patient unfortunately seems to be in my opinion a little bit worse even his wife feels like that may be the case today. Unsure exactly what is leading to this. Cardiac catheterization I did review the report which showed an ejection fraction of 25% he also had an LAD blockage of around 25% based on what I saw. With that being said there was no significant blockages that required stenting at this point he does have weakened cardiac muscles compared to normal. 10/05/2019; patient was seen yesterday in clinic. He was sent to the ER because of cellulitis of the left leg possibility. In the ER he was given 1 dose of IV Levaquin and discharged on Keflex. He came in the clinic initially for a nurse visit to rewrap his left leg. We did not look at the right leg today that is an Haematologist. The patient also had a  cardiac cath. According to him there were no blockages but a very low ejection fraction. 1/12; back for an early follow-up. The condition of the left lower leg is a lot better although there are multiple open areas. All of them with not a particularly viable surface. On the right posterior calf he has a single wound with a clean surface. He has a wound with exposed bone on the PIP of the left second toe dorsally. He has wounds on the dorsal right first second and third toes. His arterial studies were normal. 1/19; the patient has 3 open wounds on the left leg anteriorly in the mid tibia, distally and medially just above the ankle and posteriorly. On the right he has a small area dime sized on the right posterior calf. His edema control is a lot better. He still has wounds on the right second and left second toes. The left second toe has exposed bone. X- ray I did last week did not show evidence of osteomyelitis in the left foot. 1/26; patient with a multiplicity of wounds and problems. ooOn the left he has a circular area on the left anterior mid tibia ooLeft just above the medical ankle -left 2nd toe pip -right 2nd toe right posterior calf 2/2; patient with a multiplicity of wounds and problems. He has severe chronic venous insufficiency and the wounds on his leg are all on the left left anterior left medial at the medial malleolus and left posterior. We have been using Iodoflex to these areas to help with ongoing debridement. He also has largely traumatic wounds on his toes this includes the left second with exposed bone. Bone culture I did last week showed Staph  aureus which is methicillin sensitive. I discussed with him today the idea of an amputation of this toe because it is literally nonfunctional however he wants to try antibiotics. Antibiotic choice is complicated by the fact that he is on Coumadin. He also has wounds over the dorsal part of the right first which is close to closed.  Right second toe has exposed bone and the right third toe at the base of the right third toe is just about closed as well. We have been using silver alginate Objective Constitutional Patient is hypotensive. However he appears well mentating normally. Pulse regular and within target range for patient.Marland Kitchen Respirations regular, non-labored and within target range.. Temperature is normal and within the target range for the patient.Marland Kitchen Appears in no distress. Vitals Time Taken: 8:25 AM, Height: 74 in, Source: Stated, Weight: 212 lbs, Source: Stated, BMI: 27.2, Temperature: 98.0 F, Pulse: 60 bpm, Respiratory Rate: 18 breaths/min, Blood Pressure: 89/58 mmHg. General Notes: Wound exam ooDebridement of the left anterior leg wound of surface debrided. This is cleaning up quite nicely. Left posterior calf is also cleaning up. Debris on the left second toe removed from the circumference of the wound. ooRight second toe necrotic debris over the surface removed to expose bone. Integumentary (Hair, Skin) Wound #13 status is Open. Original cause of wound was Gradually Appeared. The wound is located on the Left Toe Second. The wound measures 1.8cm length x 1.5cm width x 0.8cm depth; 2.121cm^2 area and 1.696cm^3 volume. There is bone, joint, and Fat Layer (Subcutaneous Tissue) Exposed exposed. There is no tunneling or undermining noted. There is a small amount of serosanguineous drainage noted. The wound margin is distinct with the outline attached to the wound base. There is no granulation within the wound bed. There is a large (67-100%) amount of necrotic tissue within the wound bed including Eschar. Wound #19 status is Open. Original cause of wound was Gradually Appeared. The wound is located on the Left,Anterior Lower Leg. The wound measures 2.5cm length x 2cm width x 0.1cm depth; 3.927cm^2 area and 0.393cm^3 volume. There is Fat Layer (Subcutaneous Tissue) Exposed exposed. There is no tunneling  or undermining noted. There is a medium amount of serosanguineous drainage noted. The wound margin is flat and intact. There is medium (34-66%) pink granulation within the wound bed. There is a medium (34-66%) amount of necrotic tissue within the wound bed including Adherent Slough. Wound #20 status is Open. Original cause of wound was Gradually Appeared. The wound is located on the Right Toe Great. The wound measures 0.1cm length x 0.1cm width x 0.1cm depth; 0.008cm^2 area and 0.001cm^3 volume. The wound is limited to skin breakdown. There is no tunneling or undermining noted. There is a none present amount of drainage noted. The wound margin is distinct with the outline attached to the wound base. There is no granulation within the wound bed. There is no necrotic tissue within the wound bed. Wound #21 status is Open. Original cause of wound was Gradually Appeared. The wound is located on the Right Toe Second. The wound measures 1.2cm length x 0.8cm width x 0.1cm depth; 0.754cm^2 area and 0.075cm^3 volume. There is Fat Layer (Subcutaneous Tissue) Exposed exposed. There is no tunneling or undermining noted. There is a small amount of serosanguineous drainage noted. The wound margin is flat and intact. There is no granulation within the wound bed. There is a large (67-100%) amount of necrotic tissue within the wound bed including Adherent Slough. Wound #22 status is Open. Original  cause of wound was Gradually Appeared. The wound is located on the Right Toe Third. The wound measures 0.1cm length x 0.1cm width x 0.1cm depth; 0.008cm^2 area and 0.001cm^3 volume. The wound is limited to skin breakdown. There is no tunneling or undermining noted. There is a none present amount of drainage noted. The wound margin is flat and intact. There is no granulation within the wound bed. There is no necrotic tissue within the wound bed. Wound #24 status is Open. Original cause of wound was Gradually Appeared. The  wound is located on the [facility] Lower Leg. The wound measures 4.3cm length x 2.1cm width x 0.1cm depth; 7.092cm^2 area and 0.709cm^3 volume. There is Fat Layer (Subcutaneous Tissue) Exposed exposed. There is no tunneling or undermining noted. There is a medium amount of serosanguineous drainage noted. The wound margin is flat and intact. There is large (67-100%) red granulation within the wound bed. There is a small (1-33%) amount of necrotic tissue within the wound bed including Adherent Slough. Wound #25 status is Open. Original cause of wound was Gradually Appeared. The wound is located on the Left,Distal,Posterior Lower Leg. The wound measures 1.7cm length x 1.6cm width x 0.1cm depth; 2.136cm^2 area and 0.214cm^3 volume. There is Fat Layer (Subcutaneous Tissue) Exposed exposed. There is no tunneling or undermining noted. There is a small amount of serosanguineous drainage noted. The wound margin is flat and intact. There is medium (34-66%) pink granulation within the wound bed. There is a medium (34-66%) amount of necrotic tissue within the wound bed including Adherent Slough. Wound #26 status is Open. Original cause of wound was Gradually Appeared. The wound is located on the Left,Medial Lower Leg. The wound measures 1.3cm length x 0.4cm width x 0.1cm depth; 0.408cm^2 area and 0.041cm^3 volume. There is Fat Layer (Subcutaneous Tissue) Exposed exposed. There is no tunneling or undermining noted. There is a none present amount of drainage noted. The wound margin is flat and intact. There is no granulation within the wound bed. There is a large (67-100%) amount of necrotic tissue within the wound bed including Eschar and Adherent Slough. Wound #28 status is Open. Original cause of wound was Gradually Appeared. The wound is located on the Left,Proximal,Posterior Lower Leg. The wound measures 1.5cm length x 0.5cm width x 0.1cm depth; 0.589cm^2 area and 0.059cm^3 volume. There is Fat  Layer (Subcutaneous Tissue) Exposed exposed. There is no tunneling or undermining noted. There is a small amount of serosanguineous drainage noted. The wound margin is flat and intact. There is large (67-100%) red granulation within the wound bed. There is no necrotic tissue within the wound bed. Wound #3R status is Open. Original cause of wound was Gradually Appeared. The wound is located on the Right,Posterior Calf. The wound measures 0.8cm length x 0.4cm width x 0.1cm depth; 0.251cm^2 area and 0.025cm^3 volume. There is Fat Layer (Subcutaneous Tissue) Exposed exposed. There is no tunneling or undermining noted. There is a small amount of serosanguineous drainage noted. The wound margin is distinct with the outline attached to the wound base. There is large (67-100%) pink granulation within the wound bed. There is a small (1-33%) amount of necrotic tissue within the wound bed including Adherent Slough. 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 Non-pressure chronic ulcer of left calf limited to breakdown of skin Chronic venous hypertension (idiopathic) with ulcer and inflammation of  bilateral lower extremity Non-pressure chronic ulcer of other part of left foot with necrosis of bone Non-pressure chronic ulcer of other part of right foot limited to breakdown of skin Chronic systolic (congestive) heart failure Other chronic osteomyelitis, left ankle and foot Procedures Wound #13 Pre-procedure diagnosis of Wound #13 is a Diabetic Wound/Ulcer of the Lower Extremity located on the Left Toe Second .Severity of Tissue Pre Debridement is: Fat layer exposed. There was a Excisional Skin/Subcutaneous Tissue Debridement with a total area of 2.7 sq cm performed by Ricard Dillon., MD. With the  following instrument(s): Curette to remove Viable and Non-Viable tissue/material. Material removed includes Subcutaneous Tissue, Slough, Skin: Dermis, and Skin: Epidermis after achieving pain control using Other (benzocaine 20%). No specimens were taken. A time out was conducted at 09:39, prior to the start of the procedure. A Moderate amount of bleeding was controlled with Pressure. The procedure was tolerated well with a pain level of 3 throughout and a pain level of 2 following the procedure. Post Debridement Measurements: 1.8cm length x 1.5cm width x 0.8cm depth; 1.696cm^3 volume. Character of Wound/Ulcer Post Debridement is improved. Severity of Tissue Post Debridement is: Fat layer exposed. Post procedure Diagnosis Wound #13: Same as Pre-Procedure Wound #19 Pre-procedure diagnosis of Wound #19 is a Venous Leg Ulcer located on the Left,Anterior Lower Leg .Severity of Tissue Pre Debridement is: Fat layer exposed. There was a Excisional Skin/Subcutaneous Tissue Debridement with a total area of 1.5 sq cm performed by Ricard Dillon., MD. With the following instrument(s): Curette to remove Viable and Non-Viable tissue/material. Material removed includes Subcutaneous Tissue, Slough, Skin: Dermis, and Skin: Epidermis after achieving pain control using Other (benzocaine 20%). No specimens were taken. A time out was conducted at 09:39, prior to the start of the procedure. A Moderate amount of bleeding was controlled with Pressure. The procedure was tolerated well with a pain level of 3 throughout and a pain level of 2 following the procedure. Post Debridement Measurements: 2.5cm length x 1.5cm width x 0.1cm depth; 0.295cm^3 volume. Character of Wound/Ulcer Post Debridement is improved. Severity of Tissue Post Debridement is: Fat layer exposed. Post procedure Diagnosis Wound #19: Same as Pre-Procedure Pre-procedure diagnosis of Wound #19 is a Venous Leg Ulcer located on the Left,Anterior Lower  Leg . There was a Haematologist Compression Therapy Procedure by [name], RN. Post procedure Diagnosis Wound #19: Same as Pre-Procedure Wound #21 Pre-procedure diagnosis of Wound #21 is a Diabetic Wound/Ulcer of the Lower Extremity located on the Right Toe Second .Severity of Tissue Pre Debridement is: Fat layer exposed. There was a Excisional Skin/Subcutaneous Tissue Debridement with a total area of 0.96 sq cm performed by Ricard Dillon., MD. With the following instrument(s): Curette to remove Viable and Non-Viable tissue/material. Material removed includes Subcutaneous Tissue, Slough, Skin: Dermis, and Skin: Epidermis after achieving pain control using Other (benzocaine 20%). No specimens were taken. A time out was conducted at 09:39, prior to the start of the procedure. A Moderate amount of bleeding was controlled with Pressure. The procedure was tolerated well with a pain level of 3 throughout and a pain level of 2 following the procedure. Post Debridement Measurements: 1.2cm length x 0.8cm width x 0.1cm depth; 0.075cm^3 volume. Character of Wound/Ulcer Post Debridement is improved. Severity of Tissue Post Debridement is: Fat layer exposed. Post procedure Diagnosis Wound #21: Same as Pre-Procedure Wound #24 Pre-procedure diagnosis of Wound #24 is a Venous Leg Ulcer located on the Left,Distal,Anterior Lower Leg . There was a Database administrator  Boot Compression Therapy Procedure by [name], RN. Post procedure Diagnosis Wound #24: Same as Pre-Procedure Wound #25 Pre-procedure diagnosis of Wound #25 is a Venous Leg Ulcer located on the Left,Distal,Posterior Lower Leg . There was a Haematologist Compression Therapy Procedure by [name], RN. Post procedure Diagnosis Wound #25: Same as Pre-Procedure Wound #26 Pre-procedure diagnosis of Wound #26 is a Venous Leg Ulcer located on the Left,Medial Lower Leg . There was a Haematologist Compression Therapy Procedure by [name], RN. Post procedure  Diagnosis Wound #26: Same as Pre-Procedure Wound #28 Pre-procedure diagnosis of Wound #28 is a Diabetic Wound/Ulcer of the Lower Extremity located on the Left,Proximal,Posterior Lower Leg . There was a Haematologist Compression Therapy Procedure by [name], RN. Post procedure Diagnosis Wound #28: Same as Pre-Procedure Wound #3R Pre-procedure diagnosis of Wound #3R is a Diabetic Wound/Ulcer of the Lower Extremity located on the Right,Posterior Calf . There was a Haematologist Compression Therapy Procedure by [name], RN. Post procedure Diagnosis Wound #3R: Same as Pre-Procedure Plan Follow-up Appointments: Return Appointment in 1 week. - Tuesday **********EXTRA TIME**************** Dressing Change Frequency: Wound #13 Left Toe Second: Change dressing every day. Wound #19 Left,Anterior Lower Leg: Do not change entire dressing for one week. Wound #20 Right Toe Great: Change dressing every day. Wound #21 Right Toe Second: Change dressing every day. Wound #22 Right Toe Third: Change dressing every day. Wound #24 Left,Distal,Anterior Lower Leg: Do not change entire dressing for one week. Wound #25 Left,Distal,Posterior Lower Leg: Do not change entire dressing for one week. Wound #26 Left,Medial Lower Leg: Do not change entire dressing for one week. Wound #3R Right,Posterior Calf: Do not change entire dressing for one week. Skin Barriers/Peri-Wound Care: TCA Cream or Ointment Wound Cleansing: May shower with protection. Primary Wound Dressing: Wound #13 Left Toe Second: Calcium Alginate with Silver Wound #19 Left,Anterior Lower Leg: Iodoflex Wound #20 Right Toe Great: Calcium Alginate with Silver Wound #21 Right Toe Second: Calcium Alginate with Silver Wound #22 Right Toe Third: Calcium Alginate with Silver Wound #24 Left,Distal,Anterior Lower Leg: Iodoflex Wound #25 Left,Distal,Posterior Lower Leg: Iodoflex Wound #26 Left,Medial Lower Leg: Iodoflex Wound #3R  Right,Posterior Calf: Iodoflex Secondary Dressing: Dry Gauze - secure toes with tape Edema Control: Unna Boots Bilaterally Off-Loading: Open toe surgical shoe to: - left and right foot The following medication(s) was prescribed: cephalexin oral 500 mg capsule 1 capsule oral q6h for 2 weeks for osteomyelitis right 2nd toe starting 10/31/2019 1. I offered to do consider sending this patient for amputation of left second toe for now he has refused. Bone culture showed MSSA. Cephalexin 500 every 6. I have given him 2-week supply to see how he tolerates this before renewing 2. I have asked him to talk to their Coumadin clinic in [facility] about this addition for follow-up of his INR 3. Still the Iodoflex to the areas on the left leg silver alginate to the rest. 4. Silver alginate to the wounds on his toes which includes a left second right second right first and third Electronic Signature(s) Signed: 10/31/2019 5:22:39 PM By: [name] MD Entered By: [name] on 10/31/2019 11:12:29 -------------------------------------------------------------------------------- SuperBill Details Patient Name: Date of Service: Armando Gang 10/31/2019 Medical Record [id number] Patient Account Number: [id number] Date of Birth/Sex: Treating RN: May 31, 1941 (79 y.o. [name] Primary Care Provider: Hurshel Party Other Clinician: Referring Provider: Treating Provider/Extender:Kayvan Hoefling, Cheryl Flash, [place] Weeks in Treatment: 35 Diagnosis Coding ICD-10 Codes Code Description E11.51 Type 2 diabetes  mellitus with diabetic peripheral angiopathy without gangrene L97.211 Non-pressure chronic ulcer of right calf limited to breakdown of skin E11.42 Type 2 diabetes mellitus with diabetic polyneuropathy L97.221 Non-pressure chronic ulcer of left calf limited to breakdown of skin I87.333 Chronic venous hypertension (idiopathic) with ulcer and inflammation of bilateral lower  extremity L97.524 Non-pressure chronic ulcer of other part of left foot with necrosis of bone L97.511 Non-pressure chronic ulcer of other part of right foot limited to breakdown of skin K44.17 Chronic systolic (congestive) heart failure L27.871 Other chronic osteomyelitis, left ankle and foot Facility Procedures The patient participates with Medicare or their insurance follows the Medicare Facility Guidelines: CPT4 Code Description Modifier Quantity [id number] 11042 - DEB SUBQ TISSUE 20 SQ CM/< 1 ICD-10 Diagnosis Description L97.524 Non-pressure chronic ulcer of  other part of left foot with necrosis of bone L97.221 Non-pressure chronic ulcer of left calf limited to breakdown of skin Physician Procedures CPT4 Code Description: [id number] 11042 - WC PHYS SUBQ TISS 20 SQ CM ICD-10 Diagnosis Description L97.524 Non-pressure chronic ulcer of other part of left foot with L97.221 Non-pressure chronic ulcer of left calf limited to breakdo Modifier: necrosis of b wn of skin Quantity: 1 one Electronic Signature(s) Signed: 10/31/2019 5:22:39 PM By: [name] MD Entered By: [name] on 10/31/2019 11:13:15

## 2019-10-31 NOTE — Progress Notes (Addendum)
[name] ([id number]) , Visit Report for 10/31/2019 Arrival Information Details Patient Name: Date of Service: [name], [name] 10/31/2019 8:15 A M Medical Record Number: [id number] Patient Account Number: [id number] Date of Birth/Sex: Treating RN: 03-Oct-1940 (79 y.o. [name] Primary Care [name]: Leanord Asal, SA RA H Other Clinician: Referring Shayonna Ocampo: Treating Wyman Meschke/Extender: Candie Mile, SA RA H Weeks in Treatment: 58 Visit Information History Since Last Visit All ordered tests and consults were completed: Yes Patient Arrived: Wheel Chair Added or deleted any medications: No Arrival Time: 08:24 Any new allergies or adverse reactions: No Accompanied By: spouse Had a fall or experienced change in No Transfer Assistance: None activities of daily living that may affect Patient Identification Verified: Yes risk of falls: Secondary Verification Process Completed: Yes Signs or symptoms of abuse/neglect since last visito No Patient Requires Transmission-Based Precautions: No Hospitalized since last visit: No Patient Has Alerts: Yes Implantable device outside of the clinic excluding No Patient Alerts: Patient on Blood Thinner cellular tissue based products placed in the center since last visit: Has Dressing in Place as Prescribed: Yes Pain Present Now: No Electronic Signature(s) Signed: 10/31/2019 5:43:30 PM By: Baruch Gouty RN, BSN Entered By: Baruch [name] on 10/31/2019 08:25:29 -------------------------------------------------------------------------------- Compression Therapy Details Patient Name: Date of Service: Daniel Phenix D. 10/31/2019 8:15 A M Medical Record Number: [id number] Patient Account Number: [id number] Date of Birth/Sex: Treating RN: Feb 12, 1941 (79 y.o. [name]) [name] Primary Care Loman Logan: Leanord Asal, SA RA H Other Clinician: Referring [name]: Treating Dayshaun Whobrey/Extender: Candie Mile, SA RA H Weeks in Treatment:  35 Compression Therapy Performed for Wound Assessment: Wound #19 Left,Anterior Lower Leg Performed By: Jake Church, RN Compression Type: Rolena Infante Post Procedure Diagnosis Same as Pre-procedure Electronic Signature(s) Signed: 10/31/2019 5:23:18 PM By: [name] RN Entered By: [name] on 10/31/2019 09:55:40 -------------------------------------------------------------------------------- Compression Therapy Details Patient Name: Date of Service: [place], [name] 10/31/2019 8:15 A M Medical Record Number: [id number] Patient Account Number: [id number] Date of Birth/Sex: Treating RN: 05/29/1941 (79 y.o. [name]) [name] Primary Care [name]: Leanord Asal, SA RA H Other Clinician: Referring Akbar Sacra: Treating [name]/Extender: Candie Mile, SA RA H Weeks in Treatment: 35 Compression Therapy Performed for Wound Assessment: Wound #24 Left,Distal,Anterior Lower Leg Performed By: Jake Church, RN Compression Type: Rolena Infante Post Procedure Diagnosis Same as Pre-procedure Electronic Signature(s) Signed: 10/31/2019 5:23:18 PM By: [name] RN Entered By: [name] on 10/31/2019 09:55:40 -------------------------------------------------------------------------------- Compression Therapy Details Patient Name: Date of Service: JERIMIAH, WOLMAN 10/31/2019 8:15 A M Medical Record Number: [id number] Patient Account Number: [id number] Date of Birth/Sex: Treating RN: [birth year] (79 y.o. [name]) [name] Primary Care Aleja Yearwood: Leanord Asal, SA RA H Other Clinician: Referring [name]: Treating [name]/Extender: Candie Mile, SA RA H Weeks in Treatment: 35 Compression Therapy Performed for Wound Assessment: Wound #25 Left,Distal,Posterior Lower Leg Performed By: Jake Church, RN Compression Type: Rolena Infante Post Procedure Diagnosis Same as Pre-procedure Electronic Signature(s) Signed: 10/31/2019 5:23:18 PM By: [name] RN Entered By: [name] on 10/31/2019 09:55:40 -------------------------------------------------------------------------------- Compression Therapy Details Patient Name: Date of Service: [name], [name] 10/31/2019 8:15 A M Medical Record Number: [id number] Patient Account Number: [id number] Date of Birth/Sex: Treating RN: 03/09/1941 (79 y.o. [name]) [name] Primary Care Marysol Wellnitz: Leanord Asal, SA RA H Other Clinician: Referring Reyna Lorenzi: Treating Josey Forcier/Extender: Candie Mile, SA RA H Weeks in Treatment: 35 Compression Therapy Performed for  Wound Assessment: Wound #26 Left,Medial Lower Leg Performed By: Clinician [name], RN Compression Type: Rolena Infante Post Procedure Diagnosis Same as Pre-procedure Electronic Signature(s) Signed: 10/31/2019 5:23:18 PM By: [name] RN Entered By: [name] on 10/31/2019 09:55:40 -------------------------------------------------------------------------------- Compression Therapy Details Patient Name: Date of Service: [name], [name] 10/31/2019 8:15 A M Medical Record Number: [id number] Patient Account Number: [id number] Date of Birth/Sex: Treating RN: 11-28-1940 (79 y.o. [name]) [name] Primary Care Kambri Dismore: Leanord Asal, SA RA H Other Clinician: Referring Zanylah Hardie: Treating Nakiea Metzner/Extender: Candie Mile, SA RA H Weeks in Treatment: 35 Compression Therapy Performed for Wound Assessment: Wound #28 Left,Proximal,Posterior Lower Leg Performed By: Jake Church, RN Compression Type: Rolena Infante Post Procedure Diagnosis Same as Pre-procedure Electronic Signature(s) Signed: 10/31/2019 5:23:18 PM By: [name] RN Entered By: [name] on 10/31/2019 09:55:40 -------------------------------------------------------------------------------- Compression Therapy Details Patient Name: Date of Service: [name], [name] 10/31/2019 8:15 A M Medical Record Number: [id number] Patient Account Number: [id number] Date of Birth/Sex: Treating  RN: 06-15-1941 (79 y.o. [name]) [name] Primary Care Manly Nestle: Leanord Asal, SA RA H Other Clinician: Referring Hence Derrick: Treating Sadler Teschner/Extender: Candie Mile, SA RA H Weeks in Treatment: 35 Compression Therapy Performed for Wound Assessment: Wound #3R Right,Posterior Calf Performed By: Jake Church, RN Compression Type: Rolena Infante Post Procedure Diagnosis Same as Pre-procedure Electronic Signature(s) Signed: 10/31/2019 5:23:18 PM By: [name] RN Entered By: [name] on 10/31/2019 09:55:40 -------------------------------------------------------------------------------- Encounter Discharge Information Details Patient Name: Date of Service: Daniel Phenix D. 10/31/2019 8:15 A M Medical Record Number: [id number] Patient Account Number: [id number] Date of Birth/Sex: Treating RN: Feb 28, 1941 (79 y.o. [name] Primary Care Latishia Suitt: Other Clinician: Leanord Asal, SA RA H Referring Charitie Hinote: Treating Omere Marti/Extender: Candie Mile, SA RA H Weeks in Treatment: 54 Encounter Discharge Information Items Post Procedure Vitals Discharge Condition: Stable Temperature (F): 98 Ambulatory Status: Wheelchair Pulse (bpm): 60 Discharge Destination: Home Respiratory Rate (breaths/min): 18 Transportation: Private Auto Blood Pressure (mmHg): 89/58 Accompanied By: wife Schedule Follow-up Appointment: Yes Clinical Summary of Care: Patient Declined Electronic Signature(s) Signed: 10/31/2019 4:57:44 PM By: Kela Millin Entered By: Kela Millin on 10/31/2019 10:22:33 -------------------------------------------------------------------------------- Lower Extremity Assessment Details Patient Name: Date of Service: [place], MAGOWAN D. 10/31/2019 8:15 A M Medical Record Number: [id number] Patient Account Number: [id number] Date of Birth/Sex: Treating RN: 1941/08/24 (79 y.o. [name] Primary Care Aydeen Blume: Leanord Asal, [place] RA H Other Clinician: Referring  Blen Ransome: Treating Filbert Craze/Extender: Candie Mile, SA RA H Weeks in Treatment: 35 Edema Assessment Assessed: [Left: No] [Right: No] Edema: [Left: Yes] [Right: Yes] Calf Left: Right: Point of Measurement: 31 cm From Medial Instep 30.5 cm 29.5 cm Ankle Left: Right: Point of Measurement: 11 cm From Medial Instep 20.8 cm 19.8 cm Vascular Assessment Pulses: Dorsalis Pedis Palpable: [Left:No] [Right:No] Electronic Signature(s) Signed: 10/31/2019 5:43:30 PM By: Baruch Gouty RN, BSN Entered By: Baruch [name] on 10/31/2019 09:03:01 -------------------------------------------------------------------------------- Multi Wound Chart Details Patient Name: Date of Service: Daniel Phenix D. 10/31/2019 8:15 A M Medical Record Number: [id number] Patient Account Number: [id number] Date of Birth/Sex: Treating RN: December 27, 1940 (79 y.o. [name]) [name] Primary Care Saesha Llerenas: Leanord Asal, SA RA H Other Clinician: Referring Jahdai Padovano: Treating Nazair Fortenberry/Extender: Candie Mile, SA RA H Weeks in Treatment: 35 Vital Signs Height(in): 74 Pulse(bpm): 60 Weight(lbs): 212 Blood Pressure(mmHg): 89/58 Body Mass Index(BMI): 27 Temperature(F): 98.0 Respiratory Rate(breaths/min): 18 Photos: [13:No Photos Left T Second oe] [19:No  Photos Left Lower Leg - Anterior] [20:No Photos Right T Great oe] Wound Location: [13:Gradually Appeared] [19:Gradually Appeared] [20:Gradually Appeared] Wounding Event: [13:Diabetic Wound/Ulcer of the Lower] [19:Venous Leg Ulcer] [20:Diabetic Wound/Ulcer of the Lower] Primary Etiology: [13:Extremity N/A] [19:N/A] [20:Extremity N/A] Secondary Etiology: [13:Cataracts, Hypertension, Peripheral] [19:Cataracts, Hypertension, Peripheral] [20:Cataracts, Hypertension, Peripheral] Comorbid History: [13:Venous Disease, Type II Diabetes, Gout, Received Radiation 08/22/2019] [19:Venous Disease, Type II Diabetes, Gout, Received Radiation 09/23/2019] [20:Venous Disease, Type II  Diabetes, Gout, Received Radiation 09/23/2019] Date Acquired: [13:10] [19:5] [20:5] Weeks of Treatment: [13:Open] [19:Open] [20:Open] Wound Status: [13:No] [19:No] [20:No] Wound Recurrence: [13:No] [19:Yes] [20:No] Clustered Wound: [13:N/A] [19:1] [20:N/A] Clustered Quantity: [13:1.8x1.5x0.8] [19:2.5x2x0.1] [20:0.1x0.1x0.1] Measurements L x W x D (cm) [13:2.121] [19:3.927] [20:0.008] A (cm) : rea [13:1.696] [19:0.393] [20:0.001] Volume (cm) : [13:31.10%] [19:92.70%] [20:98.40%] % Reduction in Area: [13:-450.60%] [19:92.70%] [20:98.00%] % Reduction in Volume: [13:Grade 2] [19:Full Thickness Without Exposed] [20:Grade 2] Classification: [13:Small] [19:Support Structures Medium] [20:None Present] Exudate A mount: [13:Serosanguineous] [19:Serosanguineous] [20:N/A] Exudate Type: [13:red, brown] [19:red, brown] [20:N/A] Exudate Color: [13:Distinct, outline attached] [19:Flat and Intact] [20:Distinct, outline attached] Wound Margin: [13:None Present (0%)] [19:Medium (34-66%)] [20:None Present (0%)] Granulation Amount: [13:N/A] [19:Pink] [20:N/A] Granulation Quality: [13:Large (67-100%)] [19:Medium (34-66%)] [20:None Present (0%)] Necrotic Amount: [13:Eschar] [19:Adherent Slough] [20:N/A] Necrotic Tissue: [13:Fat Layer (Subcutaneous Tissue)] [19:Fat Layer (Subcutaneous Tissue)] [20:Fascia: No] Exposed Structures: [13:Exposed: Yes Joint: Yes Bone: Yes Fascia: No Tendon: No Muscle: No None] [19:Exposed: Yes Fascia: No Tendon: No Muscle: No Joint: No Bone: No Small (1-33%)] [20:Fat Layer (Subcutaneous Tissue) Exposed: No Tendon: No Muscle: No Joint: No Bone: No  Limited to Skin Breakdown Large (67-100%)] Epithelialization: [13:Debridement - Excisional] [19:Debridement - Excisional] [20:N/A] Debridement: [13:09:39] [19:09:39] [20:N/A] Pre-procedure Verification/Time Out Taken: [13:Other] [19:Other] [20:N/A] Pain Control: [13:Subcutaneous, Slough] [19:Subcutaneous, Slough] [20:N/A] Tissue  Debrided: [13:Skin/Subcutaneous Tissue] [19:Skin/Subcutaneous Tissue] [20:N/A] Level: [13:2.7] [19:1.5] [20:N/A] Debridement A (sq cm): [13:rea Curette] [19:Curette] [20:N/A] Instrument: [13:Moderate] [19:Moderate] [20:N/A] Bleeding: [13:Pressure] [19:Pressure] [20:N/A] Hemostasis A chieved: [13:3] [19:3] [20:N/A] Procedural Pain: [13:2] [19:2] [20:N/A] Post Procedural Pain: [13:Procedure was tolerated well] [19:Procedure was tolerated well] [20:N/A] Debridement Treatment Response: [13:1.8x1.5x0.8] [19:2.5x1.5x0.1] [20:N/A] Post Debridement Measurements L x W x D (cm) [13:1.696] [19:0.295] [20:N/A] Post Debridement Volume: (cm) [13:Debridement] [19:Compression Therapy] [20:N/A] Procedures Performed: [13:21] [19:Debridement 22] [20:24] Photos: [13:No 56 Right T Second oe] [19:No Photos Right T Third oe] [20:No Photos Left Lower Leg - Anterior, Distal] Wound Location: [13:Gradually Appeared] [19:Gradually Appeared] [20:Gradually Appeared] Wounding Event: [13:Diabetic Wound/Ulcer of the Lower] [19:Diabetic Wound/Ulcer of the Lower] [20:Venous Leg Ulcer] Primary Etiology: [13:Extremity N/A] [19:Extremity N/A] [20:Diabetic Wound/Ulcer of the Lower] Secondary Etiology: [13:Cataracts, Hypertension, Peripheral] [19:Cataracts, Hypertension, Peripheral] [20:Extremity Cataracts, Hypertension, Peripheral] Comorbid History: [13:Venous Disease, Type II Diabetes, Gout, Received Radiation 09/23/2019] [19:Venous Disease, Type II Diabetes, Gout, Received Radiation 09/23/2019] [20:Venous Disease, Type II Diabetes, Gout, Received Radiation 10/04/2019] Date Acquired: [13:5] [19:5] [20:3] Weeks of Treatment: [13:Open] [19:Open] [20:Open] Wound Status: [13:No] [19:No] [20:No] Wound Recurrence: [13:No] [19:Yes] [20:No] Clustered Wound: [13:N/A] [19:2] [20:N/A] Clustered Quantity: [13:1.2x0.8x0.1] [19:0.1x0.1x0.1] [20:4.3x2.1x0.1] Measurements L x W x D (cm) [13:0.754] [19:0.008] [20:7.092] A (cm) : rea  [13:0.075] [19:0.001] [20:0.709] Volume (cm) : [13:-14.20%] [19:99.30%] [20:-11.20%] % Reduction in Area: [13:-13.60%] [19:99.20%] [20:-11.10%] % Reduction in Volume: [13:Grade 2] [19:Grade 2] [20:Full Thickness Without Exposed] Classification: [13:Small] [19:None Present] [20:Support Structures Medium] Exudate A mount: [13:Serosanguineous] [19:N/A] [20:Serosanguineous] Exudate Type: [13:red, brown] [19:N/A] [20:red, brown] Exudate Color: [13:Flat and Intact] [19:Flat and Intact] [20:Flat and Intact] Wound Margin: [13:None Present (0%)] [19:None Present (0%)] [20:Large (67-100%)] Granulation  Amount: [13:N/A] [19:N/A] [20:Red] Granulation Quality: [13:Large (67-100%)] [19:None Present (0%)] [20:Small (1-33%)] Necrotic Amount: [13:Adherent Slough] [19:N/A] [20:Adherent Slough] Necrotic Tissue: [13:Fat Layer (Subcutaneous Tissue)] [19:Fascia: No] [20:Fat Layer (Subcutaneous Tissue)] Exposed Structures: [13:Exposed: Yes Fascia: No Tendon: No Muscle: No Joint: No Bone: No None] [19:Fat Layer (Subcutaneous Tissue) Exposed: No Tendon: No Muscle: No Joint: No Bone: No Limited to Skin Breakdown Large (67-100%)] [20:Exposed: Yes Fascia: No Tendon: No Muscle:  No Joint: No Bone: No Small (1-33%)] Epithelialization: [13:Debridement - Excisional] [19:N/A] [20:N/A] Debridement: [13:09:39] [19:N/A] [20:N/A] Pre-procedure Verification/Time Out Taken: [13:Other] [19:N/A] [20:N/A] Pain Control: [13:Subcutaneous, Slough] [19:N/A] [20:N/A] Tissue Debrided: [13:Skin/Subcutaneous Tissue] [19:N/A] [20:N/A] Level: [13:0.96] [19:N/A] [20:N/A] Debridement A (sq cm): [13:rea Curette] [19:N/A] [20:N/A] Instrument: [13:Moderate] [19:N/A] [20:N/A] Bleeding: [13:Pressure] [19:N/A] [20:N/A] Hemostasis A chieved: [13:3] [19:N/A] [20:N/A] Procedural Pain: [13:2] [19:N/A] [20:N/A] Post Procedural Pain: [13:Procedure was tolerated well] [19:N/A] [20:N/A] Debridement Treatment Response: [13:1.2x0.8x0.1] [19:N/A]  [20:N/A] Post Debridement Measurements L x W x D (cm) [13:0.075] [19:N/A] [20:N/A] Post Debridement Volume: (cm) [13:Debridement] [19:N/A] [20:Compression Therapy] Wound Number: '[id number] ' Photos: No Photos No Photos No Photos Left Lower Leg - Posterior, Distal Left Lower Leg - Medial Left Lower Leg - Posterior, Proximal Wound Location: Gradually Appeared Gradually Appeared Gradually Appeared Wounding Event: Venous Leg Ulcer Venous Leg Ulcer Diabetic Wound/Ulcer of the Lower Primary Etiology: Extremity Diabetic Wound/Ulcer of the Lower Diabetic Wound/Ulcer of the Lower Venous Leg Ulcer Secondary Etiology: Extremity Extremity Cataracts, Hypertension, Peripheral Cataracts, Hypertension, Peripheral Cataracts, Hypertension, Peripheral Comorbid History: Venous Disease, Type II Diabetes, Venous Disease, Type II Diabetes, Venous Disease, Type II Diabetes, Gout, Received Radiation Gout, Received Radiation Gout, Received Radiation 10/04/2019 10/04/2019 10/31/2019 Date Acquired: 3 3 0 Weeks of Treatment: Open Open Open Wound Status: No No No Wound Recurrence: Yes No No Clustered Wound: 3 N/A N/A Clustered Quantity: 1.7x1.6x0.1 1.3x0.4x0.1 1.5x0.5x0.1 Measurements L x W x D (cm) 2.136 0.408 0.589 A (cm) : rea 0.214 0.041 0.059 Volume (cm) : 91.40% 56.40% 0.00% % Reduction in Area: 91.40% 55.90% 0.00% % Reduction in Volume: Full Thickness Without Exposed Full Thickness Without Exposed Grade 1 Classification: Support Structures Support Structures Small None Present Small Exudate A mount: Serosanguineous N/A Serosanguineous Exudate Type: red, brown N/A red, brown Exudate Color: Flat and Intact Flat and Intact Flat and Intact Wound Margin: Medium (34-66%) None Present (0%) Large (67-100%) Granulation Amount: Pink N/A Red Granulation Quality: Medium (34-66%) Large (67-100%) None Present (0%) Necrotic Amount: Adherent Slough Eschar, Adherent Slough N/A Necrotic Tissue: Fat  Layer (Subcutaneous Tissue) Fat Layer (Subcutaneous Tissue) Fat Layer (Subcutaneous Tissue) Exposed Structures: Exposed: Yes Exposed: Yes Exposed: Yes Fascia: No Fascia: No Fascia: No Tendon: No Tendon: No Tendon: No Muscle: No Muscle: No Muscle: No Joint: No Joint: No Joint: No Bone: No Bone: No Bone: No Medium (34-66%) Small (1-33%) Medium (34-66%) Epithelialization: N/A N/A N/A Debridement: N/A N/A N/A Pain Control: N/A N/A N/A Tissue Debrided: N/A N/A N/A Level: N/A N/A N/A Debridement A (sq cm): rea N/A N/A N/A Instrument: N/A N/A N/A Bleeding: N/A N/A N/A Hemostasis A chieved: N/A N/A N/A Procedural Pain: N/A N/A N/A Post Procedural Pain: N/A N/A N/A Debridement Treatment Response: N/A N/A N/A Post Debridement Measurements L x W x D (cm) N/A N/A N/A Post Debridement Volume: (cm) Compression Therapy Compression Therapy Compression Therapy Procedures Performed: Wound Number: [id number] N/A N/A Photos: No Photos N/A N/A Right Calf - Posterior N/A N/A Wound Location: Gradually Appeared N/A N/A Wounding Event: Diabetic Wound/Ulcer of the Lower N/A N/A  Primary Etiology: Extremity N/A N/A N/A Secondary Etiology: Cataracts, Hypertension, Peripheral N/A N/A Comorbid History: Venous Disease, Type II Diabetes, Gout, Received Radiation 02/28/2019 N/A N/A Date A cquired: 35 N/A N/A Weeks of Treatment: Open N/A N/A Wound Status: Yes N/A N/A Wound Recurrence: No N/A N/A Clustered Wound: N/A N/A N/A Clustered Quantity: 0.8x0.4x0.1 N/A N/A Measurements L x W x D (cm) 0.251 N/A N/A A (cm) : rea 0.025 N/A N/A Volume (cm) : 99.00% N/A N/A % Reduction in A rea: 99.00% N/A N/A % Reduction in Volume: Grade 2 N/A N/A Classification: Small N/A N/A Exudate A mount: Serosanguineous N/A N/A Exudate Type: red, brown N/A N/A Exudate Color: Distinct, outline attached N/A N/A Wound Margin: Large (67-100%) N/A N/A Granulation A mount: Pink N/A  N/A Granulation Quality: Small (1-33%) N/A N/A Necrotic A mount: Adherent Slough N/A N/A Necrotic Tissue: Fat Layer (Subcutaneous Tissue) N/A N/A Exposed Structures: Exposed: Yes Fascia: No Tendon: No Muscle: No Joint: No Bone: No Medium (34-66%) N/A N/A Epithelialization: N/A N/A N/A Debridement: N/A N/A N/A Pain Control: N/A N/A N/A Tissue Debrided: N/A N/A N/A Level: N/A N/A N/A Debridement A (sq cm): rea N/A N/A N/A Instrument: N/A N/A N/A Bleeding: N/A N/A N/A Hemostasis A chieved: N/A N/A N/A Procedural Pain: N/A N/A N/A Post Procedural Pain: Debridement Treatment Response: N/A N/A N/A Post Debridement Measurements L x N/A N/A N/A W x D (cm) N/A N/A N/A Post Debridement Volume: (cm) Compression Therapy N/A N/A Procedures Performed: Treatment Notes Wound #13 (Left Toe Second) 1. Cleanse With Wound Cleanser Soap and water 3. Primary Dressing Applied Calcium Alginate Ag 4. Secondary Dressing Dry Gauze Roll Gauze 5. Secured With Tape Notes toe netting Wound #19 (Left, Anterior Lower Leg) 1. Cleanse With Wound Cleanser Soap and water 3. Primary Dressing Applied Iodoflex 4. Secondary Dressing ABD Pad Dry Gauze 6. 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 Cleanse With Wound Cleanser Soap and water 3. Primary Dressing Applied Calcium Alginate Ag 4. Secondary Dressing Dry Gauze Roll Gauze 5. Secured With Tape Notes toe netting Wound #21 (Right Toe Second) 1. Cleanse With Wound Cleanser Soap and water 3. Primary Dressing Applied Calcium Alginate Ag 4. Secondary Dressing Dry Gauze Roll Gauze 5. Secured With Tape Notes toe netting Wound #22 (Right Toe Third) 1. Cleanse With Wound Cleanser Soap and water 3. Primary Dressing Applied Calcium Alginate Ag 4. Secondary Dressing Dry Gauze Roll Gauze 5. Secured With Tape Notes toe netting Wound #24 (Left, Distal, Anterior Lower Leg) 1.  Cleanse With Wound Cleanser Soap and water 3. Primary Dressing Applied Iodoflex 4. Secondary Dressing ABD Pad Dry Gauze 6. Support Layer Applied [employer] Wound #25 (Left, Distal, Posterior Lower Leg) 1. Cleanse With Wound Cleanser Soap and water 3. Primary Dressing Applied Iodoflex 4. Secondary Dressing ABD Pad Dry Gauze 6. Support Layer Applied [employer] Wound #26 (Left, Medial Lower Leg) 1. Cleanse With Wound Cleanser Soap and water 3. Primary Dressing Applied Iodoflex 4. Secondary Dressing ABD Pad Dry Gauze 6. Support Layer Applied [employer] Wound #28 (Left, Proximal, Posterior Lower Leg) 1. Cleanse With Wound Cleanser Soap and water 3. Primary Dressing Applied Iodoflex 4. Secondary Dressing ABD Pad Dry Gauze 6. Support Layer Applied [employer] Wound #3R (Right, Posterior Calf) 1. Cleanse With Wound Cleanser Soap and water 3. Primary Dressing Applied Iodoflex 4. Secondary Dressing ABD Pad Dry Gauze 6. Support Layer Psychiatric nurse) Signed: 10/31/2019 5:22:39 PM  By: [name] MD Signed: 10/31/2019 5:23:18 PM By: [name] RN Entered By: [name] on 10/31/2019 11:01:55 -------------------------------------------------------------------------------- [place] Details Patient Name: Date of Service: Daniel Phenix D. 10/31/2019 8:15 A M Medical Record Number: [id number] Patient Account Number: [id number] Date of Birth/Sex: Treating RN: 12/19/1940 (79 y.o. [name]) [name] Primary Care Brizeida Mcmurry: Leanord Asal, SA RA H Other Clinician: Referring Zaara Sprowl: Treating Tamara Kenyon/Extender: Candie Mile, SA RA H Weeks in Treatment: 35 Active Inactive Venous Leg Ulcer Nursing Diagnoses: Knowledge deficit related to disease process and management Potential for venous Insuffiency (use before diagnosis confirmed) Goals: Patient will  maintain optimal edema control Date Initiated: 10/04/2019 Target Resolution Date: 11/01/2019 Goal Status: Active Interventions: Assess peripheral edema status every visit. Compression as ordered Treatment Activities: Therapeutic compression applied : 10/04/2019 Notes: Wound/Skin Impairment Nursing Diagnoses: Knowledge deficit related to ulceration/compromised skin integrity Goals: Patient/caregiver will verbalize understanding of skin care regimen Date Initiated: 02/28/2019 Target Resolution Date: 11/01/2019 Goal Status: Active Ulcer/skin breakdown will have a volume reduction of 30% by week 4 Date Initiated: 02/28/2019 Date Inactivated: 04/04/2019 Target Resolution Date: 03/31/2019 Goal Status: Met Ulcer/skin breakdown will have a volume reduction of 50% by week 8 Date Initiated: 04/04/2019 Date Inactivated: 05/09/2019 Target Resolution Date: 05/05/2019 Goal Status: Met Ulcer/skin breakdown will have a volume reduction of 80% by week 12 Date Initiated: 05/09/2019 Date Inactivated: 06/13/2019 Target Resolution Date: 06/09/2019 Unmet Reason: comorbities/new Goal Status: Unmet wounds Ulcer/skin breakdown will heal within 14 weeks Date Initiated: 06/13/2019 Date Inactivated: 07/11/2019 Target Resolution Date: 07/07/2019 Goal Status: Unmet Unmet Reason: comorbityies Interventions: Assess patient/caregiver ability to obtain necessary supplies Assess patient/caregiver ability to perform ulcer/skin care regimen upon admission and as needed Assess ulceration(s) every visit Notes: Electronic Signature(s) Signed: 10/31/2019 5:23:18 PM By: [name] RN Signed: 10/31/2019 5:23:18 PM By: [name] RN Entered By: [name] on 10/31/2019 08:01:49 -------------------------------------------------------------------------------- Pain Assessment Details Patient Name: Date of Service: Daniel Phenix D. 10/31/2019 8:15 A M Medical Record Number: [id number] Patient Account Number: [id number] Date of  Birth/Sex: Treating RN: 1941/03/01 (79 y.o. [name] Primary Care Loudon Krakow: Leanord Asal, [place] RA H Other Clinician: Referring Dodd Schmid: Treating Ichelle Harral/Extender: Candie Mile, SA RA H Weeks in Treatment: 31 Active Problems Location of Pain Severity and Description of Pain Patient Has Paino No Site Locations Rate the pain. Current Pain Level: 0 Pain Management and Medication Current Pain Management: Electronic Signature(s) Signed: 10/31/2019 5:43:30 PM By: Baruch Gouty RN, BSN Entered By: Baruch [name] on 10/31/2019 08:26:15 -------------------------------------------------------------------------------- Patient/Caregiver Education Details Patient Name: Date of Service: Lattie Corns 2/2/2021andnbsp8:15 A M Medical Record Number: [id number] Patient Account Number: [id number] Date of Birth/Gender: Treating RN: 07-Apr-1941 (79 y.o. [name]) [name] Primary Care Physician: Leanord Asal, SA RA H Other Clinician: Referring Physician: Treating Physician/Extender: Candie Mile, SA RA H Weeks in Treatment: 19 Education Assessment Education Provided To: Patient Education Topics Provided Wound/Skin Impairment: Methods: Explain/Verbal Responses: State content correctly Electronic Signature(s) Signed: 10/31/2019 5:23:18 PM By: [name] RN Entered By: [name] on 10/31/2019 08:02:07 -------------------------------------------------------------------------------- Wound Assessment Details Patient Name: Date of Service: TREYVIN, GLIDDEN 10/31/2019 8:15 A M Medical Record Number: [id number] Patient Account Number: [id number] Date of Birth/Sex: Treating RN: 02-14-1941 (79 y.o. [name]) [name] Primary Care Candas Deemer: Leanord Asal, SA RA H Other Clinician: Referring Melvin Marmo: Treating Ladanian Kelter/Extender: Candie Mile, SA RA H Weeks in Treatment: 35 Wound Status Wound Number: 13 Primary Diabetic Wound/Ulcer  of the Lower Extremity Etiology: Wound  Location: Left T Second oe Wound Open Wounding Event: Gradually Appeared Status: Date Acquired: 08/22/2019 Comorbid Cataracts, Hypertension, Peripheral Venous Disease, Type II Weeks Of Treatment: 10 History: Diabetes, Gout, Received Radiation Clustered Wound: No Photos Wound Measurements Length: (cm) 1.8 Width: (cm) 1.5 Depth: (cm) 0.8 Area: (cm) 2.121 Volume: (cm) 1.696 % Reduction in Area: 31.1% % Reduction in Volume: -450.6% Epithelialization: None Tunneling: No Undermining: No Wound Description Classification: Grade 2 Wound Margin: Distinct, outline attached Exudate Amount: Small Exudate Type: Serosanguineous Exudate Color: red, brown Foul Odor After Cleansing: No Slough/Fibrino Yes Wound Bed Granulation Amount: None Present (0%) Exposed Structure Necrotic Amount: Large (67-100%) Fascia Exposed: No Necrotic Quality: Eschar Fat Layer (Subcutaneous Tissue) Exposed: Yes Tendon Exposed: No Muscle Exposed: No Joint Exposed: Yes Bone Exposed: Yes Electronic Signature(s) Signed: 11/07/2019 4:28:10 PM By: Mikeal Hawthorne EMT/HBOT Signed: 03/26/2020 5:26:37 PM By: [name] RN Previous Signature: 10/31/2019 5:43:30 PM Version By: Baruch Gouty RN, BSN Entered By: Mikeal Hawthorne on 11/07/2019 15:12:25 -------------------------------------------------------------------------------- Wound Assessment Details Patient Name: Date of Service: Daniel Phenix D. 10/31/2019 8:15 A M Medical Record Number: [id number] Patient Account Number: [id number] Date of Birth/Sex: Treating RN: 03/04/1941 (79 y.o. [name]) [name] Primary Care Azelea Seguin: Leanord Asal, SA RA H Other Clinician: Referring Lucas Winograd: Treating Azlin Zilberman/Extender: Candie Mile, SA RA H Weeks in Treatment: 35 Wound Status Wound Number: 19 Primary Venous Leg Ulcer Etiology: Wound Location: Left Lower Leg - Anterior Wound Open Wounding Event: Gradually Appeared Status: Date Acquired: 09/23/2019 Comorbid  Cataracts, Hypertension, Peripheral Venous Disease, Type II Weeks Of Treatment: 5 History: Diabetes, Gout, Received Radiation Clustered Wound: Yes Photos Wound Measurements Length: (cm) 2.5 Width: (cm) 2 Depth: (cm) 0.1 Clustered Quantity: 1 Area: (cm) 3.927 Volume: (cm) 0.393 % Reduction in Area: 92.7% % Reduction in Volume: 92.7% Epithelialization: Small (1-33%) Tunneling: No Undermining: No Wound Description Classification: Full Thickness Without Exposed Support Structures Wound Margin: Flat and Intact Exudate Amount: Medium Exudate Type: Serosanguineous Exudate Color: red, brown Foul Odor After Cleansing: No Slough/Fibrino Yes Wound Bed Granulation Amount: Medium (34-66%) Exposed Structure Granulation Quality: Pink Fascia Exposed: No Necrotic Amount: Medium (34-66%) Fat Layer (Subcutaneous Tissue) Exposed: Yes Necrotic Quality: Adherent Slough Tendon Exposed: No Muscle Exposed: No Joint Exposed: No Bone Exposed: No Electronic Signature(s) Signed: 11/07/2019 4:28:10 PM By: Mikeal Hawthorne EMT/HBOT Signed: 03/26/2020 5:26:37 PM By: [name] RN Previous Signature: 10/31/2019 5:43:30 PM Version By: Baruch Gouty RN, BSN Entered By: Mikeal Hawthorne on 11/07/2019 15:06:51 -------------------------------------------------------------------------------- Wound Assessment Details Patient Name: Date of Service: Daniel Phenix D. 10/31/2019 8:15 A M Medical Record Number: [id number] Patient Account Number: [id number] Date of Birth/Sex: Treating RN: January 16, 1941 (79 y.o. [name]) [name] Primary Care Shiva Karis: Leanord Asal, SA RA H Other Clinician: Referring Ciaran Begay: Treating Juno Alers/Extender: Candie Mile, SA RA H Weeks in Treatment: 35 Wound Status Wound Number: 20 Primary Diabetic Wound/Ulcer of the Lower Extremity Etiology: Wound Location: Right T Great oe Wound Open Wounding Event: Gradually Appeared Status: Date Acquired: 09/23/2019 Comorbid Cataracts,  Hypertension, Peripheral Venous Disease, Type II Weeks Of Treatment: 5 History: Diabetes, Gout, Received Radiation Clustered Wound: No Photos Wound Measurements Length: (cm) 0.1 Width: (cm) 0.1 Depth: (cm) 0.1 Area: (cm) 0.008 Volume: (cm) 0.001 % Reduction in Area: 98.4% % Reduction in Volume: 98% Epithelialization: Large (67-100%) Tunneling: No Undermining: No Wound Description Classification: Grade 2 Wound Margin: Distinct, outline attached Exudate Amount: None Present Foul Odor After Cleansing: No Slough/Fibrino No Wound  Bed Granulation Amount: None Present (0%) Exposed Structure Necrotic Amount: None Present (0%) Fascia Exposed: No Fat Layer (Subcutaneous Tissue) Exposed: No Tendon Exposed: No Muscle Exposed: No Joint Exposed: No Bone Exposed: No Limited to Skin Breakdown Electronic Signature(s) Signed: 11/07/2019 4:28:10 PM By: Mikeal Hawthorne EMT/HBOT Signed: 03/26/2020 5:26:37 PM By: [name] RN Previous Signature: 10/31/2019 5:43:30 PM Version By: Baruch Gouty RN, BSN Entered By: Mikeal Hawthorne on 11/07/2019 15:13:32 -------------------------------------------------------------------------------- Wound Assessment Details Patient Name: Date of Service: Daniel Phenix D. 10/31/2019 8:15 A M Medical Record Number: [id number] Patient Account Number: [id number] Date of Birth/Sex: Treating RN: 1941-04-20 (79 y.o. [name]) [name] Primary Care Leng Montesdeoca: Leanord Asal, SA RA H Other Clinician: Referring Giannie Soliday: Treating Durante Violett/Extender: Candie Mile, SA RA H Weeks in Treatment: 35 Wound Status Wound Number: 21 Primary Diabetic Wound/Ulcer of the Lower Extremity Etiology: Wound Location: Right T Second oe Wound Open Wounding Event: Gradually Appeared Status: Date Acquired: 09/23/2019 Comorbid Cataracts, Hypertension, Peripheral Venous Disease, Type II Weeks Of Treatment: 5 History: Diabetes, Gout, Received Radiation Clustered Wound:  No Photos Wound Measurements Length: (cm) 1.2 Width: (cm) 0.8 Depth: (cm) 0.1 Area: (cm) 0.754 Volume: (cm) 0.075 % Reduction in Area: -14.2% % Reduction in Volume: -13.6% Epithelialization: None Tunneling: No Undermining: No Wound Description Classification: Grade 2 Wound Margin: Flat and Intact Exudate Amount: Small Exudate Type: Serosanguineous Exudate Color: red, brown Foul Odor After Cleansing: No Slough/Fibrino Yes Wound Bed Granulation Amount: None Present (0%) Exposed Structure Necrotic Amount: Large (67-100%) Fascia Exposed: No Necrotic Quality: Adherent Slough Fat Layer (Subcutaneous Tissue) Exposed: Yes Tendon Exposed: No Muscle Exposed: No Joint Exposed: No Bone Exposed: No Electronic Signature(s) Signed: 11/07/2019 4:28:10 PM By: Mikeal Hawthorne EMT/HBOT Signed: 03/26/2020 5:26:37 PM By: [name] RN Previous Signature: 10/31/2019 5:43:30 PM Version By: Baruch Gouty RN, BSN Entered By: Mikeal Hawthorne on 11/07/2019 15:10:40 -------------------------------------------------------------------------------- Wound Assessment Details Patient Name: Date of Service: Daniel Phenix D. 10/31/2019 8:15 A M Medical Record Number: [id number] Patient Account Number: [id number] Date of Birth/Sex: Treating RN: 14-Feb-1941 (79 y.o. [name]) [name] Primary Care Temima Kutsch: Leanord Asal, SA RA H Other Clinician: Referring Illias Pantano: Treating Clarrissa Shimkus/Extender: Candie Mile, SA RA H Weeks in Treatment: 35 Wound Status Wound Number: 22 Primary Diabetic Wound/Ulcer of the Lower Extremity Etiology: Wound Location: Right T Third oe Wound Healed - Epithelialized Wounding Event: Gradually Appeared Status: Date Acquired: 09/23/2019 Comorbid Cataracts, Hypertension, Peripheral Venous Disease, Type II Weeks Of Treatment: 5 History: Diabetes, Gout, Received Radiation Clustered Wound: Yes Photos Wound Measurements Length: (cm) Width: (cm) Depth: (cm) Clustered  Quantity: Area: (cm) Volume: (cm) 0 % Reduction in Area: 99.3% 0 % Reduction in Volume: 99.2% 0 Epithelialization: Large (67-100%) 2 Tunneling: No 0 Undermining: No 0 Wound Description Classification: Grade 2 Wound Margin: Flat and Intact Exudate Amount: None Present Foul Odor After Cleansing: No Slough/Fibrino No Wound Bed Granulation Amount: None Present (0%) Exposed Structure Necrotic Amount: None Present (0%) Fascia Exposed: No Fat Layer (Subcutaneous Tissue) Exposed: No Tendon Exposed: No Muscle Exposed: No Joint Exposed: No Bone Exposed: No Limited to Skin Breakdown Electronic Signature(s) Signed: 11/07/2019 4:28:10 PM By: Mikeal Hawthorne EMT/HBOT Signed: 03/26/2020 5:26:37 PM By: [name] RN Previous Signature: 10/31/2019 5:43:30 PM Version By: Baruch Gouty RN, BSN Entered By: Mikeal Hawthorne on 11/07/2019 15:10:15 -------------------------------------------------------------------------------- Wound Assessment Details Patient Name: Date of Service: Daniel Phenix D. 10/31/2019 8:15 A M Medical Record Number: [id number] Patient Account Number: [id number] Date of Birth/Sex: Treating RN:  12-09-1940 (79 y.o. [name]) [name] Primary Care Esten Dollar: Leanord Asal, SA RA H Other Clinician: Referring Saralyn Willison: Treating Anvi Mangal/Extender: Candie Mile, SA RA H Weeks in Treatment: 35 Wound Status Wound Number: 24 Primary Venous Leg Ulcer Etiology: Wound Location: Left Lower Leg - Anterior, Distal Secondary Diabetic Wound/Ulcer of the Lower Extremity Wounding Event: Gradually Appeared Etiology: Date Acquired: 10/04/2019 Wound Open Weeks Of Treatment: 3 Status: Clustered Wound: No Comorbid Cataracts, Hypertension, Peripheral Venous Disease, Type II History: Diabetes, Gout, Received Radiation Photos Wound Measurements Length: (cm) 4.3 Width: (cm) 2.1 Depth: (cm) 0.1 Area: (cm) 7.092 Volume: (cm) 0.709 % Reduction in Area: -11.2% % Reduction in Volume:  -11.1% Epithelialization: Small (1-33%) Tunneling: No Undermining: No Wound Description Classification: Full Thickness Without Exposed Support Structures Wound Margin: Flat and Intact Exudate Amount: Medium Exudate Type: Serosanguineous Exudate Color: red, brown Foul Odor After Cleansing: No Slough/Fibrino Yes Wound Bed Granulation Amount: Large (67-100%) Exposed Structure Granulation Quality: Red Fascia Exposed: No Necrotic Amount: Small (1-33%) Fat Layer (Subcutaneous Tissue) Exposed: Yes Necrotic Quality: Adherent Slough Tendon Exposed: No Muscle Exposed: No Joint Exposed: No Bone Exposed: No Electronic Signature(s) Signed: 11/07/2019 4:28:10 PM By: Mikeal Hawthorne EMT/HBOT Signed: 03/26/2020 5:26:37 PM By: [name] RN Previous Signature: 10/31/2019 5:43:30 PM Version By: Baruch Gouty RN, BSN Entered By: Mikeal Hawthorne on 11/07/2019 15:06:30 -------------------------------------------------------------------------------- Wound Assessment Details Patient Name: Date of Service: Daniel Phenix D. 10/31/2019 8:15 A M Medical Record Number: [id number] Patient Account Number: [id number] Date of Birth/Sex: Treating RN: 07-19-1941 (79 y.o. [name]) [name] Primary Care Alfonso Shackett: Leanord Asal, SA RA H Other Clinician: Referring Naziya Hegwood: Treating Zinedine Ellner/Extender: Candie Mile, SA RA H Weeks in Treatment: 35 Wound Status Wound Number: 25 Primary Venous Leg Ulcer Etiology: Wound Location: Left Lower Leg - Posterior, Distal Secondary Diabetic Wound/Ulcer of the Lower Extremity Wounding Event: Gradually Appeared Etiology: Date Acquired: 10/04/2019 Wound Open Weeks Of Treatment: 3 Status: Clustered Wound: Yes Comorbid Cataracts, Hypertension, Peripheral Venous Disease, Type II History: Diabetes, Gout, Received Radiation Photos Wound Measurements Length: (cm) Width: (cm) Depth: (cm) Clustered Quantity: Area: (cm) Volume: (cm) 1.7 % Reduction in Area: 91.4% 1.6  % Reduction in Volume: 91.4% 0.1 Epithelialization: Medium (34-66%) 3 Tunneling: No 2.136 Undermining: No 0.214 Wound Description Classification: Full Thickness Without Exposed Support Structures Wound Margin: Flat and Intact Exudate Amount: Small Exudate Type: Serosanguineous Exudate Color: red, brown Foul Odor After Cleansing: No Slough/Fibrino Yes Wound Bed Granulation Amount: Medium (34-66%) Exposed Structure Granulation Quality: Pink Fascia Exposed: No Necrotic Amount: Medium (34-66%) Fat Layer (Subcutaneous Tissue) Exposed: Yes Necrotic Quality: Adherent Slough Tendon Exposed: No Muscle Exposed: No Joint Exposed: No Bone Exposed: No Electronic Signature(s) Signed: 11/07/2019 4:28:10 PM By: Mikeal Hawthorne EMT/HBOT Signed: 03/26/2020 5:26:37 PM By: [name] RN Previous Signature: 10/31/2019 5:43:30 PM Version By: Baruch Gouty RN, BSN Entered By: Mikeal Hawthorne on 11/07/2019 15:04:42 -------------------------------------------------------------------------------- Wound Assessment Details Patient Name: Date of Service: Daniel Phenix D. 10/31/2019 8:15 A M Medical Record Number: [id number] Patient Account Number: [id number] Date of Birth/Sex: Treating RN: November 19, 1940 (79 y.o. [name]) [name] Primary Care Kamon Fahr: Leanord Asal, SA RA H Other Clinician: Referring Oswell Say: Treating Devario Bucklew/Extender: Candie Mile, SA RA H Weeks in Treatment: 35 Wound Status Wound Number: 26 Primary Venous Leg Ulcer Etiology: Wound Location: Left Lower Leg - Medial Secondary Diabetic Wound/Ulcer of the Lower Extremity Wounding Event: Gradually Appeared Etiology: Date Acquired: 10/04/2019 Wound Open Weeks Of Treatment: 3 Status: Clustered Wound: No Comorbid Cataracts,  Hypertension, Peripheral Venous Disease, Type II History: Diabetes, Gout, Received Radiation Photos Wound Measurements Length: (cm) 1.3 Width: (cm) 0.4 Depth: (cm) 0.1 Area: (cm) 0.408 Volume: (cm)  0.041 % Reduction in Area: 56.4% % Reduction in Volume: 55.9% Epithelialization: Small (1-33%) Tunneling: No Undermining: No Wound Description Classification: Full Thickness Without Exposed Support Structures Wound Margin: Flat and Intact Exudate Amount: None Present Foul Odor After Cleansing: No Slough/Fibrino No Wound Bed Granulation Amount: None Present (0%) Exposed Structure Necrotic Amount: Large (67-100%) Fascia Exposed: No Necrotic Quality: Eschar, Adherent Slough Fat Layer (Subcutaneous Tissue) Exposed: Yes Tendon Exposed: No Muscle Exposed: No Joint Exposed: No Bone Exposed: No Electronic Signature(s) Signed: 11/07/2019 4:28:10 PM By: Mikeal Hawthorne EMT/HBOT Signed: 03/26/2020 5:26:37 PM By: [name] RN Previous Signature: 10/31/2019 5:43:30 PM Version By: Baruch Gouty RN, BSN Entered By: Mikeal Hawthorne on 11/07/2019 15:04:13 -------------------------------------------------------------------------------- Wound Assessment Details Patient Name: Date of Service: Daniel Phenix D. 10/31/2019 8:15 A M Medical Record Number: [id number] Patient Account Number: [id number] Date of Birth/Sex: Treating RN: [birth year] (79 y.o. [name]) [name] Primary Care Donicia Druck: Leanord Asal, SA RA H Other Clinician: Referring Pairlee Sawtell: Treating Ayrianna Mcginniss/Extender: Candie Mile, SA RA H Weeks in Treatment: 35 Wound Status Wound Number: 28 Primary Diabetic Wound/Ulcer of the Lower Extremity Etiology: Wound Location: Left Lower Leg - Posterior, Proximal Secondary Venous Leg Ulcer Wounding Event: Gradually Appeared Etiology: Date Acquired: 10/31/2019 Wound Open Weeks Of Treatment: 0 Status: Clustered Wound: No Comorbid Cataracts, Hypertension, Peripheral Venous Disease, Type II History: Diabetes, Gout, Received Radiation Photos Wound Measurements Length: (cm) 1.5 Width: (cm) 0.5 Depth: (cm) 0.1 Area: (cm) 0.589 Volume: (cm) 0.059 % Reduction in Area: 0% % Reduction in  Volume: 0% Epithelialization: Medium (34-66%) Tunneling: No Undermining: No Wound Description Classification: Grade 1 Wound Margin: Flat and Intact Exudate Amount: Small Exudate Type: Serosanguineous Exudate Color: red, brown Foul Odor After Cleansing: No Slough/Fibrino No Wound Bed Granulation Amount: Large (67-100%) Exposed Structure Granulation Quality: Red Fascia Exposed: No Necrotic Amount: None Present (0%) Fat Layer (Subcutaneous Tissue) Exposed: Yes Tendon Exposed: No Muscle Exposed: No Joint Exposed: No Bone Exposed: No Electronic Signature(s) Signed: 11/07/2019 4:28:10 PM By: Mikeal Hawthorne EMT/HBOT Signed: 03/26/2020 5:26:37 PM By: [name] RN Previous Signature: 10/31/2019 5:43:30 PM Version By: Baruch Gouty RN, BSN Entered By: Mikeal Hawthorne on 11/07/2019 15:02:27 -------------------------------------------------------------------------------- Wound Assessment Details Patient Name: Date of Service: Daniel Phenix D. 10/31/2019 8:15 A M Medical Record Number: [id number] Patient Account Number: [id number] Date of Birth/Sex: Treating RN: 1941-01-11 (79 y.o. [name]) [name] Primary Care Marissa Weaver: Leanord Asal, SA RA H Other Clinician: Referring Shermon Bozzi: Treating Robynne Roat/Extender: Candie Mile, SA RA H Weeks in Treatment: 35 Wound Status Wound Number: [id number] Primary Diabetic Wound/Ulcer of the Lower Extremity Etiology: Wound Location: Right Calf - Posterior Wound Open Wounding Event: Gradually Appeared Status: Date Acquired: 02/28/2019 Comorbid Cataracts, Hypertension, Peripheral Venous Disease, Type II Weeks Of Treatment: 35 History: Diabetes, Gout, Received Radiation Clustered Wound: No Photos Wound Measurements Length: (cm) 0.8 Width: (cm) 0.4 Depth: (cm) 0.1 Area: (cm) 0.251 Volume: (cm) 0.025 % Reduction in Area: 99% % Reduction in Volume: 99% Epithelialization: Medium (34-66%) Tunneling: No Undermining: No Wound  Description Classification: Grade 2 Wound Margin: Distinct, outline attached Exudate Amount: Small Exudate Type: Serosanguineous Exudate Color: red, brown Foul Odor After Cleansing: No Slough/Fibrino No Wound Bed Granulation Amount: Large (67-100%) Exposed Structure Granulation Quality: Pink Fascia Exposed: No Necrotic Amount: Small (1-33%) Fat Layer (Subcutaneous Tissue) Exposed: Yes Necrotic  Quality: Adherent Slough Tendon Exposed: No Muscle Exposed: No Joint Exposed: No Bone Exposed: No Electronic Signature(s) Signed: 11/07/2019 4:28:10 PM By: Mikeal Hawthorne EMT/HBOT Signed: 03/26/2020 5:26:37 PM By: [name] RN Previous Signature: 10/31/2019 5:43:30 PM Version By: Baruch Gouty RN, BSN Entered By: Mikeal Hawthorne on 11/07/2019 15:15:42 -------------------------------------------------------------------------------- Holiday Details Patient Name: Date of Service: Daniel Phenix D. 10/31/2019 8:15 A M Medical Record Number: [id number] Patient Account Number: [id number] Date of Birth/Sex: Treating RN: 03-15-1941 (79 y.o. [name] Primary Care Lindalee Huizinga: Leanord Asal, [place] RA H Other Clinician: Referring Amandy Chubbuck: Treating Derik Fults/Extender: Candie Mile, SA RA H Weeks in Treatment: 35 Vital Signs Time Taken: 08:25 Temperature (F): 98.0 Height (in): 74 Pulse (bpm): 60 Source: Stated Respiratory Rate (breaths/min): 18 Weight (lbs): 212 Blood Pressure (mmHg): 89/58 Source: Stated Reference Range: 80 - 120 mg / dl Body Mass Index (BMI): 27.2 Electronic Signature(s) Signed: 10/31/2019 5:43:30 PM By: Baruch Gouty RN, BSN Entered By: Baruch [name] on 10/31/2019 08:46:22

## 2019-11-01 ENCOUNTER — Ambulatory Visit (INDEPENDENT_AMBULATORY_CARE_PROVIDER_SITE_OTHER): Payer: Medicare HMO | Admitting: *Deleted

## 2019-11-01 DIAGNOSIS — I4821 Permanent atrial fibrillation: Secondary | ICD-10-CM | POA: Diagnosis not present

## 2019-11-01 DIAGNOSIS — Z5181 Encounter for therapeutic drug level monitoring: Secondary | ICD-10-CM

## 2019-11-01 LAB — POCT INR: INR: 2.4 (ref 2.0–3.0)

## 2019-11-01 NOTE — Patient Instructions (Addendum)
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 Recheck in 2 weeks

## 2019-11-07 ENCOUNTER — Encounter (HOSPITAL_BASED_OUTPATIENT_CLINIC_OR_DEPARTMENT_OTHER): Payer: Medicare HMO | Attending: Internal Medicine | Admitting: Internal Medicine

## 2019-11-07 ENCOUNTER — Other Ambulatory Visit: Payer: Self-pay

## 2019-11-07 DIAGNOSIS — E11622 Type 2 diabetes mellitus with other skin ulcer: Secondary | ICD-10-CM | POA: Diagnosis not present

## 2019-11-07 DIAGNOSIS — I87312 Chronic venous hypertension (idiopathic) with ulcer of left lower extremity: Secondary | ICD-10-CM | POA: Diagnosis not present

## 2019-11-07 DIAGNOSIS — L97511 Non-pressure chronic ulcer of other part of right foot limited to breakdown of skin: Secondary | ICD-10-CM | POA: Diagnosis not present

## 2019-11-07 DIAGNOSIS — L97221 Non-pressure chronic ulcer of left calf limited to breakdown of skin: Secondary | ICD-10-CM | POA: Diagnosis not present

## 2019-11-07 DIAGNOSIS — I890 Lymphedema, not elsewhere classified: Secondary | ICD-10-CM | POA: Diagnosis not present

## 2019-11-07 DIAGNOSIS — L03116 Cellulitis of left lower limb: Secondary | ICD-10-CM | POA: Diagnosis not present

## 2019-11-07 DIAGNOSIS — I87333 Chronic venous hypertension (idiopathic) with ulcer and inflammation of bilateral lower extremity: Secondary | ICD-10-CM | POA: Diagnosis not present

## 2019-11-07 DIAGNOSIS — L97524 Non-pressure chronic ulcer of other part of left foot with necrosis of bone: Secondary | ICD-10-CM | POA: Diagnosis not present

## 2019-11-07 DIAGNOSIS — I110 Hypertensive heart disease with heart failure: Secondary | ICD-10-CM | POA: Diagnosis not present

## 2019-11-07 DIAGNOSIS — E11621 Type 2 diabetes mellitus with foot ulcer: Secondary | ICD-10-CM | POA: Diagnosis not present

## 2019-11-07 DIAGNOSIS — L97222 Non-pressure chronic ulcer of left calf with fat layer exposed: Secondary | ICD-10-CM | POA: Diagnosis not present

## 2019-11-07 DIAGNOSIS — L97212 Non-pressure chronic ulcer of right calf with fat layer exposed: Secondary | ICD-10-CM | POA: Diagnosis not present

## 2019-11-07 DIAGNOSIS — L97211 Non-pressure chronic ulcer of right calf limited to breakdown of skin: Secondary | ICD-10-CM | POA: Diagnosis not present

## 2019-11-07 NOTE — Progress Notes (Addendum)
[name] ([id number]) Visit Report for 11/07/2019 Arrival Information Details Patient Name: Date of Service: [name], [name] 11/07/2019 8:45 AM Medical Record [place] Patient Account Number: [id number] Date of Birth/Sex: Treating RN: 1941/09/16 (79 y.o. [name] Primary Care Shaft Corigliano: [place] Party Other Clinician: Referring Layney Gillson: Treating Ifrah Vest/Extender:Robson, Cheryl Flash, [place] Weeks in Treatment: 74 Visit Information History Since Last Visit Added or deleted any medications: No Patient Arrived: Wheel Chair Any new allergies or adverse reactions: No Arrival Time: 08:49 Had a fall or experienced change in No Accompanied By: spouse activities of daily living that may affect Transfer Assistance: None risk of falls: Patient Identification Verified: Yes Signs or symptoms of abuse/neglect since last No Secondary Verification Process Yes visito Completed: Hospitalized since last visit: No Patient Requires Transmission- No Implantable device outside of the clinic excluding No Based Precautions: cellular tissue based products placed in the center Patient Has Alerts: Yes since last visit: Patient Alerts: Patient on Blood Has Dressing in Place as Prescribed: Yes Thinner Has Compression in Place as Prescribed: Yes Pain Present Now: No Electronic Signature(s) Signed: 11/07/2019 5:18:10 PM By: Baruch Gouty RN, BSN Entered By: Baruch Gouty on 11/07/2019 08:53:35 -------------------------------------------------------------------------------- Compression Therapy Details Patient Name: Date of Service: Daniel Pia D. 11/07/2019 8:45 AM Medical Record [id number] Patient Account Number: [id number] Date of Birth/Sex: Treating RN: 08/26/1941 (78 y.o. [name]) [name] Primary Care Latasha Puskas: [place] Party Other Clinician: Referring Demya Scruggs: Treating Geoffrey Mankin/Extender:Robson, Cheryl Flash, [place] Weeks in Treatment:  36 Compression Therapy Performed for Wound Wound #19 Left,Anterior Lower Leg Assessment: Performed By: Jake Church, RN Compression Type: Rolena Infante Post Procedure Diagnosis Same as Pre-procedure Electronic Signature(s) Signed: 11/07/2019 5:19:34 PM By: [name] RN Entered By: [name] on 11/07/2019 09:57:21 -------------------------------------------------------------------------------- Compression Therapy Details Patient Name: Date of Service: [name] 11/07/2019 8:45 AM Medical Record [id number] Patient Account Number: [id number] Date of Birth/Sex: Treating RN: 03/25/1941 (78 y.o. [name]) [name] Primary Care Amiyrah Lamere: [place] Party Other Clinician: Referring Morena Mckissack: Treating Zealand Boyett/Extender:Robson, Cheryl Flash, [place] Weeks in Treatment: 36 Compression Therapy Performed for Wound Wound #24 Left,Distal,Anterior Lower Leg Assessment: Performed By: Jake Church, RN Compression Type: Rolena Infante Post Procedure Diagnosis Same as Pre-procedure Electronic Signature(s) Signed: 11/07/2019 5:19:34 PM By: [name] RN Entered By: [name] on 11/07/2019 09:57:21 -------------------------------------------------------------------------------- Compression Therapy Details Patient Name: Date of Service: [name] 11/07/2019 8:45 AM Medical Record [id number] Patient Account Number: [id number] Date of Birth/Sex: Treating RN: June 15, 1941 (78 y.o. [name]) [name] Primary Care Samiah Ricklefs: [place] Party Other Clinician: Referring Laurelyn Terrero: Treating Bently Morath/Extender:Robson, Cheryl Flash, [place] Weeks in Treatment: 36 Compression Therapy Performed for Wound Wound #25 Left,Distal,Posterior Lower Leg Assessment: Performed By: Jake Church, RN Compression Type: Rolena Infante Post Procedure Diagnosis Same as Pre-procedure Electronic Signature(s) Signed: 11/07/2019 5:19:34 PM By: [name] RN Entered By: [name]  on 11/07/2019 09:57:22 -------------------------------------------------------------------------------- Compression Therapy Details Patient Name: Date of Service: [name] 11/07/2019 8:45 AM Medical Record [id number] Patient Account Number: [id number] Date of Birth/Sex: Treating RN: 19-Aug-1941 (78 y.o. [name]) [name] Primary Care Lella Mullany: [place] Party Other Clinician: Referring Derrek Puff: Treating Verneta Hamidi/Extender:Robson, Cheryl Flash, [place] Weeks in Treatment: 36 Compression Therapy Performed for Wound Wound #28 Left,Proximal,Posterior Lower Leg Assessment: Performed By: Jake Church, RN Compression Type: Rolena Infante Post Procedure Diagnosis Same as Pre-procedure Electronic Signature(s) Signed: 11/07/2019 5:19:34 PM By: [name] RN Entered By: [name] on 11/07/2019 09:57:22 -------------------------------------------------------------------------------- Compression Therapy Details Patient Name: Date of Service: Daniel Pia D. 11/07/2019 8:45 AM Medical  Record [id number] Patient Account Number: [id number] Date of Birth/Sex: Treating RN: [birth year] (78 y.o. [name]) [name] Primary Care Floetta Brickey: [place] Party Other Clinician: Referring Modena Bellemare: Treating Ivery Nanney/Extender:Robson, Cheryl Flash, [place] Weeks in Treatment: 36 Compression Therapy Performed for Wound Wound #3R Right,Posterior Calf Assessment: Performed By: Jake Church, RN Compression Type: Rolena Infante Post Procedure Diagnosis Same as Pre-procedure Electronic Signature(s) Signed: 11/07/2019 5:19:34 PM By: [name] RN Entered By: [name] on 11/07/2019 09:57:22 -------------------------------------------------------------------------------- Encounter Discharge Information Details Patient Name: Date of Service: Daniel Pia D. 11/07/2019 8:45 AM Medical Record [id number] Patient Account Number: [id number] Date of Birth/Sex: Treating  RN: 08-23-1941 (79 y.o. [name] Primary Care Starlett Pehrson: [place] Party Other Clinician: Referring Jurnee Nakayama: Treating Geoffrey Mankin/Extender:Robson, Cheryl Flash, [place] Weeks in Treatment: 65 Encounter Discharge Information Items Discharge Condition: Stable Ambulatory Status: Wheelchair Discharge Destination: Home Transportation: Private Auto Accompanied By: wife Schedule Follow-up Appointment: Yes Clinical Summary of Care: Patient Declined Electronic Signature(s) Signed: 11/07/2019 5:16:17 PM By: Kela Millin Entered By: Kela Millin on 11/07/2019 10:42:27 -------------------------------------------------------------------------------- Lower Extremity Assessment Details Patient Name: Date of Service: PATRYCK, KILGORE 11/07/2019 8:45 AM Medical Record [id number] Patient Account Number: [id number] Date of Birth/Sex: Treating RN: Jul 31, 1941 (79 y.o. [name] Primary Care [name] [name]: [place] Party Other Clinician: Referring Kambryn Dapolito: Treating Semone Orlov/Extender:Robson, Cheryl Flash, [place] Weeks in Treatment: 36 Edema Assessment Assessed: [Left: No] [Right: No] Edema: [Left: Yes] [Right: Yes] Calf Left: Right: Point of Measurement: 31 cm From Medial Instep 30.8 cm 30.5 cm Ankle Left: Right: Point of Measurement: 11 cm From Medial Instep 21 cm 20.5 cm Vascular Assessment Pulses: Dorsalis Pedis Palpable: [Left:No] [Right:No] Electronic Signature(s) Signed: 11/07/2019 5:18:10 PM By: Baruch Gouty RN, BSN Entered By: Baruch Gouty on 11/07/2019 09:11:41 -------------------------------------------------------------------------------- Multi Wound Chart Details Patient Name: Date of Service: Daniel Pia D. 11/07/2019 8:45 AM Medical Record [id number] Patient Account Number: [id number] Date of Birth/Sex: Treating RN: Sep 07, 1941 (78 y.o. 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] [20:Diabetic Wound/Ulcer of the Lower Extremity] Secondary Etiology: [13:N/A] [19:N/A] [20:N/A] Comorbid History: [13:Cataracts, Hypertension, Cataracts, Hypertension, Cataracts, Hypertension, Peripheral Venous Disease, Peripheral Venous Disease, Peripheral Venous Disease, Type II Diabetes, Gout, Received Radiation] [19:Type II Diabetes, Gout,  Received Radiation] [20:Type II Diabetes, Gout, Received Radiation] Date Acquired: [13:08/22/2019] [19:09/23/2019] [20:09/23/2019] Weeks of Treatment: [13:11] [19:6] [20:6] Wound Status: [13:Open] [19:Open] [20:Open] Wound Recurrence: [13:No] [19:No] [20:No] Clustered Wound: [13:No] [19:Yes] [20:No] Clustered Quantity: [13:N/A] [19:1] [20:N/A] Measurements L x W x D 1.7x1.9x0.5 [19:2.3x1.4x0.1] [20:0.1x0.1x0.1] (cm) Area (cm) : [13:2.537] [19:2.529] [20:0.008] Volume (cm) : [13:1.268] [19:0.253] [20:0.001] % Reduction in Area: [13:17.60%] [19:95.30%] [20:98.40%] % Reduction in Volume: -311.70% [19:95.30%] [20:98.00%] Classification: [13:Grade 2] [19:Full Thickness Without Exposed Support Structures] [20:Grade 2] Exudate Amount: [13:Medium] [19:Medium] [20:None Present] Exudate Type: [13:Serosanguineous] [19:Serosanguineous] [20:N/A] Exudate Color: [13:red, brown] [19:red, brown] [20:N/A] Wound Margin: [13:Distinct, outline attached] [19:Flat and Intact] [20:Distinct, outline  attached] Granulation Amount: [13:None Present (0%)] [19:Large (67-100%)] [20:None Present (0%)] Granulation Quality: [13:N/A] [19:Pink] [20:N/A] Necrotic Amount: [13:Large (67-100%)] [19:Small (1-33%)] [20:None Present (0%)] Necrotic Tissue: [13:Eschar, Adherent Slough] [19:Adherent Slough] [20:N/A] Exposed Structures: [13:Fat Layer (Subcutaneous  Tissue) Exposed: Yes Joint: Yes Bone: Yes Fascia: No Tendon: No Muscle: No] [19:Fat Layer (Subcutaneous Tissue) Exposed: Yes Fascia: No Tendon: No Muscle: No Joint: No Bone: No] [20:Fascia: No Fat Layer (Subcutaneous Tissue)  Exposed: No Tendon: No Muscle: No Joint: No Bone: No Limited to Skin Breakdown] Epithelialization: [13:None] [19:Small (1-33%)] [20:Large (67-100%)] Procedures Performed: [13:N/A] [19:Compression Therapy 21] [20:N/A 22] Photos: [13:No Photos] [20:No Photos No Photos] Wound Location: [13:Right Toe Second] [19:Right Toe Third] [20:Left Lower Leg - Anterior, Distal] Wounding Event: [13:Gradually Appeared] [19:Gradually Appeared] [20:Gradually Appeared] Primary Etiology: [13:Diabetic Wound/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] [19:Lower Extremity] Secondary Etiology: [13:N/A] [19:N/A] [20:Diabetic Wound/Ulcer of the Lower Extremity] Comorbid History: [13:Cataracts, Hypertension, Cataracts, Hypertension, Cataracts, Hypertension, Peripheral Venous Disease, Peripheral Venous Disease, Peripheral Venous Disease, Type II Diabetes, Gout, Received Radiation] [19:Type II Diabetes, Gout,  Received Radiation] [20:Type II Diabetes, Gout, Received Radiation] Date Acquired: [13:09/23/2019] [19:09/23/2019] [20:10/04/2019] Weeks of Treatment: [13:6] [19:6] [20:4] Wound Status: [13:Open] [19:Healed - Epithelialized] [20:Open] Wound Recurrence: [13:No] [19:No] [20:No] Clustered Wound: [13:No] [19:Yes] [20:No] Clustered Quantity: [13:N/A] [19:2] [20:N/A] Measurements L x W x D 1.4x1.1x0.1 [19:0x0x0]  [20:4.2x2.1x0.1] (cm) Area (cm) : [13:1.21] [19:0] [20:6.927] Volume (cm) : [13:0.121] [19:0] [20:0.693] % Reduction in Area: [13:-83.30%] [19:100.00%] [20:-8.60%] % Reduction in Volume: -83.30% [19:100.00%] [20:-8.60%] Classification: [13:Grade 2] [19:Grade 2] [20:Full Thickness Without Exposed Support Structures] Exudate Amount: [13:None Present] [19:None Present] [20:Medium] Exudate Type: [13:N/A] [19:N/A] [20:Serosanguineous] Exudate Color: [13:N/A] [19:N/A] [20:red, brown] Wound Margin: [13:Flat and Intact] [19:Flat and Intact] [20:Flat and Intact] Granulation Amount: [13:None Present (0%)] [19:None Present (0%)] [20:Large (67-100%)] Granulation Quality: [13:N/A] [19:N/A] [20:Red, Pink] Necrotic Amount: [13:Large (67-100%)] [19:None Present (0%)] [20:Small (1-33%)] Necrotic Tissue: [13:Eschar, Adherent Slough N/A] [20:Adherent Slough] Exposed Structures: [13:Fat Layer (Subcutaneous Fascia: No Tissue) Exposed: Yes Bone: Yes Fascia: No Tendon: No Muscle: No Joint: No] [19:Fat Layer (Subcutaneous Tissue) Exposed: Yes Tissue) Exposed: No Tendon: No Muscle: No Joint: No Bone: No Limited to Skin Breakdown]  [20:Fat Layer (Subcutaneous Fascia: No Tendon: No Muscle: No Joint: No Bone: No] Epithelialization: [13:None] [19:Large (67-100%)] [20:Small (1-33%)] Procedures Performed: [13:N/A] [19:N/A 25] [20:Compression Therapy 26] Photos: [13:No Photos] [19:No Photos] [20:No Photos] Wound Location: [13:Left Lower Leg - Posterior, Left Lower Leg - Medial Left Lower Leg - Posterior, Distal] [20:Proximal] Wounding Event: [13:Gradually Appeared] [19:Gradually Appeared] [20:Gradually Appeared] Primary Etiology: [13:Venous Leg Ulcer] [19:Venous Leg Ulcer] [20:Diabetic Wound/Ulcer of the Lower Extremity] Secondary Etiology: [13:Diabetic Wound/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] [19:Lower Extremity] Comorbid History: [13:Cataracts, Hypertension, Cataracts,  Hypertension, Cataracts, Hypertension, Peripheral Venous Disease, Peripheral Venous Disease, Peripheral Venous Disease, Type II Diabetes, Gout, Received Radiation] [19:Type II Diabetes, Gout,  Received Radiation] [20:Type II Diabetes, Gout, Received Radiation] Date Acquired: [13:10/04/2019] [19:10/04/2019] [20:10/31/2019] Weeks of Treatment: [13:4] [19:4] [20:1] Wound Status: [13:Open] [19:Healed - Epithelialized] [20:Open] Wound Recurrence: [13:No] [19:No] [20:No] Clustered Wound: [13:Yes] [19:No] [20:No] Clustered Quantity: [13:3] [19:N/A] [20:N/A] Measurements L x W x D 1.9x1.9x0.1 [19:0x0x0] [20:1.4x0.7x0.1] (cm) Area (cm) : [13:2.835] [19:0] [20:0.77] Volume (cm) : [13:0.284] [19:0] [20:0.077] % Reduction in Area: [13:88.50%] [19:100.00%] [20:-30.70%] % Reduction in Volume: 88.50% [19:100.00%] [20:-30.50%] Classification: [13:Full Thickness Without Exposed Support Structures Exposed Support Structures] [19:Full Thickness Without] [20:Grade 1] Exudate Amount: [13:Medium] [19:None Present] [20:Small] Exudate Type: [13:Serosanguineous] [19:N/A] [20:Serosanguineous] Exudate Color: [13:red, brown] [19:N/A] [20:red, brown] Wound Margin: [13:Flat and Intact] [19:Flat and Intact] [20:Flat and Intact] Granulation Amount: [13:Medium (34-66%)] [19:None Present (0%)] [20:Large (67-100%)] Granulation Quality: [13:Pink] [19:N/A] [20:Pink] Necrotic Amount: [13:Medium (  34-66%)] [19:None Present (0%)] [20:None Present (0%)] Necrotic Tissue: [13:Eschar, Adherent Slough N/A] [20:N/A] Exposed Structures: [13:Fat Layer (Subcutaneous Fascia: No Tissue) Exposed: Yes Fascia: No Tendon: No Muscle: No Joint: No Bone: No] [19:Fat Layer (Subcutaneous Tissue) Exposed: Yes Tissue) Exposed: No Tendon: No Muscle: No Joint: No Bone: No] [20:Fat Layer (Subcutaneous  Fascia: No Tendon: No Muscle: No Joint: No Bone: No] Epithelialization: [13:Small (1-33%)] [19:Large (67-100%)] [20:Medium (34-66%)] Procedures Performed:  Compression Therapy [19:N/A 3R] [20:Compression Therapy N/A N/A] Photos: [13:No Photos] [19:N/A] [20:N/A] Wound Location: [13:Right Calf - Posterior] [19:N/A] [20:N/A] Wounding Event: [13:Gradually Appeared] [19:N/A] [20:N/A] Primary Etiology: [13:Diabetic Wound/Ulcer of the N/A Lower Extremity] [20:N/A] Secondary Etiology: [13:N/A] [19:N/A] [20:N/A] Comorbid History: [13:Cataracts, Hypertension, N/A Peripheral Venous Disease, Type II Diabetes, Gout, Received Radiation] [20:N/A] Date Acquired: [13:02/28/2019] [19:N/A] [20:N/A] Weeks of Treatment: [13:36] [19:N/A] [20:N/A] Wound Status: [13:Open] [19:N/A N/A] Wound Recurrence: [13:Yes] [19:N/A N/A] Clustered Wound: [13:No] [19:N/A N/A] Clustered Quantity: [13:N/A] [19:N/A N/A] Measurements L x W x D [13:0.6x0.4x0.1] [19:N/A N/A] (cm) Area (cm) : [13:0.188] [19:N/A N/A] Volume (cm) : [13:0.019] [19:N/A N/A] % Reduction in Area: [13:99.20%] [19:N/A N/A] % Reduction in Volume: [13:99.20%] [19:N/A N/A] Classification: [13:Grade 2] [19:N/A N/A] Exudate Amount: [13:Small] [19:N/A N/A] Exudate Type: [13:Serosanguineous] [19:N/A N/A] Exudate Color: [13:red, brown] [19:N/A N/A] Wound Margin: [13:Distinct, outline attached] [19:N/A N/A] Granulation Amount: [13:Large (67-100%)] [19:N/A N/A] Granulation Quality: [13:Pink] [19:N/A N/A] Necrotic Amount: [13:None Present (0%)] [19:N/A N/A] Necrotic Tissue: [13:N/A] [19:N/A N/A] Exposed Structures: [13:Fat Layer (Subcutaneous Tissue) Exposed: Yes Fascia: No Tendon: No Muscle: No Joint: No Bone: No] [19:N/A N/A] Epithelialization: [13:Medium (34-66%) Compression Therapy] [19:N/A N/A N/A N/A] Treatment Notes Wound #13 (Left Toe Second) 1. Cleanse With Wound Cleanser Soap and water 3. Primary Dressing Applied Calcium Alginate Ag 4. Secondary Dressing Dry Gauze Notes netting Wound #19 (Left, Anterior Lower Leg) 1. Cleanse With Wound Cleanser Soap and water 3. Primary Dressing  Applied Collegen AG 4. Secondary Dressing Dry Gauze 6. 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 Cleanse With Wound Cleanser Soap and water 3. Primary Dressing Applied Calcium Alginate Ag 4. Secondary Dressing Dry Gauze Notes netting Wound #21 (Right Toe Second) 1. Cleanse With Wound Cleanser Soap and water 3. Primary Dressing Applied Calcium Alginate Ag 4. Secondary Dressing Dry Gauze Notes netting Wound #24 (Left, Distal, Anterior Lower Leg) 1. Cleanse With Wound Cleanser Soap and water 3. Primary Dressing Applied Collegen AG 4. Secondary Dressing Dry Gauze 6. Support Layer Applied [employer] Wound #25 (Left, Distal, Posterior Lower Leg) 1. Cleanse With Wound Cleanser Soap and water 3. Primary Dressing Applied Collegen [employer] 4. Secondary Dressing Dry Gauze 6. Support Layer Applied Kerl[employer] Wound #28 (Left, Proximal, Posterior Lower Leg) 1. Cleanse With Wound Cleanser Soap and water 3. Primary Dressing Applied Collegen [employer] 4. Secondary Dressing Dry Gauze 6. Support Layer Applied Ker[employer] Wound #3R (Right, Posterior Calf) 1. Cleanse With Wound Cleanser Soap and water 3. Primary Dressing Applied Collegen [employer] 4. Secondary Dressing Dry Gauze 6. Support Layer Applied Kerlix/[employer] Product manager) Signed: 11/07/2019 5:08:08 PM By: [name] MD Signed: 11/07/2019 5:19:34 PM By: [name] RN Entered By: [name] on 11/07/2019 11:39:31 -------------------------------------------------------------------------------- Multi-Disciplinary Care Plan Details Patient Name: Date of Service: BAYARD, MORE 11/07/2019 8:45 AM Medical Record [id number] Patient Account Number: [id number] Date of Birth/Sex: Treating RN: [birth year] (78 y.o. [name]) [name] Primary Care Phoebe Marter: [place] Party Other Clinician: Referring Yoan Sallade: Treating  Torre Pikus/Extender:Robson, Cheryl Flash, [facility] [place]  in Treatment: 36 Active Inactive Wound/Skin Impairment Nursing Diagnoses: Knowledge deficit related to ulceration/compromised skin integrity Goals: Patient/caregiver will verbalize understanding of skin care regimen Date Initiated: 02/28/2019 Target Resolution Date: 12/01/2019 Goal Status: Active Ulcer/skin breakdown will have a volume reduction of 30% by week 4 Date Initiated: 02/28/2019 Date Inactivated: 04/04/2019 Target Resolution Date: 03/31/2019 Goal Status: Met Ulcer/skin breakdown will have a volume reduction of 50% by week 8 Date Initiated: 04/04/2019 Date Inactivated: 05/09/2019 Target Resolution Date: 05/05/2019 Goal Status: Met Ulcer/skin breakdown will have a volume reduction of 80% by week 12 Date Initiated: 05/09/2019 Date Inactivated: 06/13/2019 Target Resolution Date: 06/09/2019 Unmet Goal Status: Unmet Reason: comorbities/new wounds Ulcer/skin breakdown will heal within 14 weeks Date Initiated: 06/13/2019 Date Inactivated: 07/11/2019 Target Resolution Date: 07/07/2019 Unmet Goal Status: Unmet Reason: comorbityies Interventions: Assess patient/caregiver ability to obtain necessary supplies Assess patient/caregiver ability to perform ulcer/skin care regimen upon admission and as needed Assess ulceration(s) every visit Notes: Electronic Signature(s) Signed: 11/07/2019 5:19:34 PM By: [name] RN Entered By: [name] on 11/07/2019 08:50:55 -------------------------------------------------------------------------------- Pain Assessment Details Patient Name: Date of Service: MAREK, NGHIEM 11/07/2019 8:45 AM Medical Record [id number] Patient Account Number: [id number] Date of Birth/Sex: Treating RN: November 13, 1940 (79 y.o. [name] Primary Care Ionna Avis: [place] Party Other Clinician: Referring Keyontae Huckeby: Treating Tova Vater/Extender:Robson, Cheryl Flash, [place] Weeks in Treatment: 74 Active  Problems Location of Pain Severity and Description of Pain Patient Has Paino No Site Locations Rate the pain. Current Pain Level: 0 Pain Management and Medication Current Pain Management: Electronic Signature(s) Signed: 11/07/2019 5:18:10 PM By: Baruch Gouty RN, BSN Entered By: Baruch Gouty on 11/07/2019 08:55:59 -------------------------------------------------------------------------------- Patient/Caregiver Education Details Patient Name: Date of Service: KEO, SCHIRMER 2/9/2021andnbsp8:45 AM Medical Record [id number] Patient Account Number: [id number] Date of Birth/Gender: Treating RN: September 21, 1941 (78 y.o. [name]) Dolores Lory, Morey Hummingbird Primary Care Physician: [place] Party Other Clinician: Referring Physician: Treating Physician/Extender:Robson, Cheryl Flash, [place] Weeks in Treatment: 11 Education Assessment Education Provided To: Patient Education Topics Provided Wound/Skin Impairment: Methods: Explain/Verbal Responses: State content correctly Electronic Signature(s) Signed: 11/07/2019 5:19:34 PM By: [name] RN Entered By: [name] on 11/07/2019 08:51:13 -------------------------------------------------------------------------------- Wound Assessment Details Patient Name: Date of Service: JORAN, KALLAL 11/07/2019 8:45 AM Medical Record [id number] Patient Account Number: [id number] Date of Birth/Sex: Treating RN: [birth year] (78 y.o. [name]) [name] Primary Care Jeyda Siebel: [place] Party Other Clinician: Referring Dontez Hauss: Treating Maeleigh Buschman/Extender:Robson, Cheryl Flash, [place] Weeks in Treatment: 36 Wound Status Wound Number: 13 Primary Diabetic Wound/Ulcer of the Lower Extremity Etiology: Wound Location: Left Toe Second Wound Open Wounding Event: Gradually Appeared Status: Date Acquired: 08/22/2019 Comorbid Cataracts, Hypertension, Peripheral Venous Weeks Of Treatment: 11 History: Disease, Type II Diabetes, Gout, Received Clustered  Wound: No Radiation Photos Wound Measurements Length: (cm) 1.7 % Reductio Width: (cm) 1.9 % Reductio Depth: (cm) 0.5 Epithelial Area: (cm) 2.537 Tunneling Volume: (cm) 1.268 Undermini Wound Description Classification: Grade 2 Wound Margin: Distinct, outline attached Exudate Amount: Medium Exudate Type: Serosanguineous Exudate Color: red, brown Wound Bed Granulation Amount: None Present (0%) Necrotic Amount: Large (67-100%) Necrotic Quality: Eschar, Adherent Slough Foul Odor After Cleansing: No Slough/Fibrino Yes Exposed Structure Fascia Exposed: No Fat Layer (Subcutaneous Tissue) Exposed: Yes Tendon Exposed: No Muscle Exposed: No Joint Exposed: Yes Bone Exposed: Yes n in Area: 17.6% n in Volume: -311.7% ization: None : No ng: No Treatment Notes Wound #13 (Left Toe Second) 1. Cleanse With Wound Cleanser Soap and water 3. Primary Dressing Applied Calcium Alginate Ag 4. Secondary Dressing Dry Gauze Notes netting Electronic Signature(s)  Signed: 11/08/2019 4:31:42 PM By: Mikeal Hawthorne EMT/HBOT Signed: 11/08/2019 4:53:37 PM By: [name] RN Previous Signature: 11/07/2019 5:18:10 PM Version By: Baruch Gouty RN, BSN Entered By: Mikeal Hawthorne on 11/08/2019 11:26:21 -------------------------------------------------------------------------------- Wound Assessment Details Patient Name: Date of Service: EDWARDS, MCKELVIE 11/07/2019 8:45 AM Medical Record [id number] Patient Account Number: [id number] Date of Birth/Sex: Treating RN: [birth year] (78 y.o. [name]) Dolores Lory, [place] Primary Care Tracye Szuch: [place] Other Clinician: Referring Zachary Lovins: Treating Malikah Lakey/Extender:Robson, Cheryl Flash, [place] Weeks in Treatment: 36 Wound Status Wound Number: 19 Primary Venous Leg Ulcer Etiology: Wound Location: Left Lower Leg - Anterior Wound Open Wounding Event: Gradually Appeared Status: Date Acquired: 09/23/2019 Comorbid Cataracts, Hypertension, Peripheral  Venous Weeks Of Treatment: 6 History: Disease, Type II Diabetes, Gout, Received Clustered Wound: Yes Radiation Photos Wound Measurements Length: (cm) 2.3 % Reduct Width: (cm) 1.4 % Reduct Depth: (cm) 0.1 Epitheli Clustered Quantity: 1 Tunnelin Area: (cm) 2.529 Undermi Volume: (cm) 0.253 Wound Description Full Thickness Without Exposed Support Foul Odo Classification: Structures Slough/F Wound Flat and Intact Margin: Exudate Medium Amount: Exudate Serosanguineous Type: Exudate red, brown Color: Wound Bed Granulation Amount: Large (67-100%) Granulation Quality: Pink Fascia E Necrotic Amount: Small (1-33%) Fat Laye Necrotic Quality: Adherent Slough Tendon E Muscle E Joint Ex Bone Exp r After Cleansing: No ibrino Yes Exposed Structure xposed: No r (Subcutaneous Tissue) Exposed: Yes xposed: No xposed: No posed: No osed: No ion in Area: 95.3% ion in Volume: 95.3% alization: Small (1-33%) g: No ning: No Treatment Notes Wound #19 (Left, Anterior Lower Leg) 1. Cleanse With Wound Cleanser Soap and water 3. Primary Dressing Applied Collegen AG 4. Secondary Dressing Dry Gauze 6. Support Layer Psychiatric nurse) Signed: 11/08/2019 4:31:42 PM By: Mikeal Hawthorne EMT/HBOT Signed: 11/08/2019 4:53:37 PM By: [name] RN Previous Signature: 11/07/2019 5:18:10 PM Version By: Baruch Gouty RN, BSN Entered By: Mikeal Hawthorne on 11/08/2019 11:25:36 -------------------------------------------------------------------------------- Wound Assessment Details Patient Name: Date of Service: OBALOLUWA, DELATTE 11/07/2019 8:45 AM Medical Record [id number] Patient Account Number: [id number] Date of Birth/Sex: Treating RN: 01-24-1941 (78 y.o. [name]) [name] Primary Care Tracey Stewart: [place] Other Clinician: Referring Tirzah Fross: Treating Hedy Garro/Extender:Robson, Cheryl Flash, [place] Weeks in Treatment: 36 Wound Status Wound  Number: 20 Primary Diabetic Wound/Ulcer of the Lower Extremity Etiology: Wound Location: Right Toe Great Wound Open Wounding Event: Gradually Appeared Status: Date Acquired: 09/23/2019 Comorbid Cataracts, Hypertension, Peripheral Venous Weeks Of Treatment: 6 History: Disease, Type II Diabetes, Gout, Received Clustered Wound: No Radiation Photos Wound Measurements Length: (cm) 0.1 % Reductio Width: (cm) 0.1 % Reductio Depth: (cm) 0.1 Epithelial Area: (cm) 0.008 Tunneling Volume: (cm) 0.001 Undermini Wound Description Classification: Grade 2 Wound Margin: Distinct, outline attached Exudate Amount: None Present Wound Bed Granulation Amount: None Present (0%) Necrotic Amount: None Present (0%) Foul Odor After Cleansing: No Slough/Fibrino No Exposed Structure Fascia Exposed: No Fat Layer (Subcutaneous Tissue) Exposed: No Tendon Exposed: No Muscle Exposed: No Joint Exposed: No Bone Exposed: No Limited to Skin Breakdown n in Area: 98.4% n in Volume: 98% ization: Large (67-100%) : No ng: No Treatment Notes Wound #20 (Right Toe Great) 1. Cleanse With Wound Cleanser Soap and water 3. Primary Dressing Applied Calcium Alginate Ag 4. Secondary Dressing Dry Gauze Notes netting Electronic Signature(s) Signed: 11/08/2019 4:31:42 PM By: Mikeal Hawthorne EMT/HBOT Signed: 11/08/2019 4:53:37 PM By: [name] RN Previous Signature: 11/07/2019 5:18:10 PM Version By: Baruch Gouty RN, BSN Entered By: Mikeal Hawthorne on 11/08/2019 11:33:33 -------------------------------------------------------------------------------- Wound Assessment Details Patient Name: Date of Service: Joya Gaskins,  Jerimey D. 11/07/2019 8:45 AM Medical Record [id number] Patient Account Number: [id number] Date of Birth/Sex: Treating RN: Jul 08, 1941 (78 y.o. [name]) [name] Primary Care Lavaya Defreitas: [place] Other Clinician: Referring Monigue Spraggins: Treating Kelia Gibbon/Extender:Robson, Cheryl Flash,  [place] Weeks in Treatment: 36 Wound Status Wound Number: 21 Primary Diabetic Wound/Ulcer of the Lower Extremity Etiology: Wound Location: Right Toe Second Wound Open Wounding Event: Gradually Appeared Status: Date Acquired: 09/23/2019 Comorbid Cataracts, Hypertension, Peripheral Venous Weeks Of Treatment: 6 History: Disease, Type II Diabetes, Gout, Received Clustered Wound: No Radiation Photos Wound Measurements Length: (cm) 1.4 Width: (cm) 1.1 Depth: (cm) 0.1 Area: (cm) 1.21 Volume: (cm) 0.121 Wound Description Classification: Grade 2 Wound Margin: Flat and Intact Exudate Amount: None Present Wound Bed Granulation Amount: None Present (0%) Necrotic Amount: Large (67-100%) Necrotic Quality: Eschar, Adherent Slough After Cleansing: No brino Yes Exposed Structure posed: No (Subcutaneous Tissue) Exposed: Yes posed: No posed: No osed: No sed: Yes % Reduction in Area: -83.3% % Reduction in Volume: -83.3% Epithelialization: None Tunneling: No Undermining: No Foul Odor Slough/Fi Fascia Ex Fat Layer Tendon Ex Muscle Ex Joint Exp Bone Expo Treatment Notes Wound #21 (Right Toe Second) 1. Cleanse With Wound Cleanser Soap and water 3. Primary Dressing Applied Calcium Alginate Ag 4. Secondary Dressing Dry Gauze Notes netting Electronic Signature(s) Signed: 11/08/2019 4:31:42 PM By: Mikeal Hawthorne EMT/HBOT Signed: 11/08/2019 4:53:37 PM By: [name] RN Previous Signature: 11/07/2019 5:18:10 PM Version By: Baruch Gouty RN, BSN Entered By: Mikeal Hawthorne on 11/08/2019 11:33:10 -------------------------------------------------------------------------------- Wound Assessment Details Patient Name: Date of Service: JOEZIAH, VOIT 11/07/2019 8:45 AM Medical Record [id number] Patient Account Number: [id number] Date of Birth/Sex: Treating RN: [birth year] (78 y.o. [name]) Dolores Lory, [place] Primary Care Alecia Doi: [place] Other Clinician: Referring Isidora Laham:  Treating Lucill Mauck/Extender:Robson, Cheryl Flash, [place] Weeks in Treatment: 36 Wound Status Wound Number: 22 Primary Diabetic Wound/Ulcer of the Lower Extremity Etiology: Wound Location: Right Toe Third Wound Healed - Epithelialized Wounding Event: Gradually Appeared Status: Date Acquired: 09/23/2019 Comorbid Cataracts, Hypertension, Peripheral Venous Weeks Of Treatment: 6 History: Disease, Type II Diabetes, Gout, Received Clustered Wound: Yes Radiation Photos Wound Measurements Length: (cm) 0 % Reductio Width: (cm) 0 % Reductio Depth: (cm) 0 Epithelial Clustered Quantity: 2 Tunneling: Area: (cm) 0 Undermini Volume: (cm) 0 Wound Description Classification: Grade 2 Wound Margin: Flat and Intact Exudate Amount: None Present Wound Bed Granulation Amount: None Present (0%) Necrotic Amount: None Present (0%) Foul Odor After Cleansing: No Slough/Fibrino No Exposed Structure Fascia Exposed: No Fat Layer (Subcutaneous Tissue) Exposed: No Tendon Exposed: No Muscle Exposed: No Joint Exposed: No Bone Exposed: No Limited to Skin Breakdown n in Area: 100% n in Volume: 100% ization: Large (67-100%) No ng: No Electronic Signature(s) Signed: 11/08/2019 4:31:42 PM By: Mikeal Hawthorne EMT/HBOT Signed: 11/08/2019 4:53:37 PM By: [name] RN Previous Signature: 11/07/2019 5:18:10 PM Version By: Baruch Gouty RN, BSN Entered By: Mikeal Hawthorne on 11/08/2019 11:28:20 -------------------------------------------------------------------------------- Wound Assessment Details Patient Name: Date of Service: Daniel Pia D. 11/07/2019 8:45 AM Medical Record [id number] Patient Account Number: [id number] Date of Birth/Sex: Treating RN: 03/13/1941 (78 y.o. [name]) [name] Primary Care Jahmad Petrich: [place] Other Clinician: Referring Laconya Clere: Treating Latravious Levitt/Extender:Robson, Cheryl Flash, [place] Weeks in Treatment: 36 Wound Status Wound Number: 24 Primary Venous  Leg Ulcer Etiology: Wound Location: Left Lower Leg - Anterior, Distal Secondary Diabetic Wound/Ulcer of the Lower Extremity Wounding Event: Gradually Appeared Etiology: Date Acquired: 10/04/2019 Wound Open Weeks Of Treatment: 4 Status: Clustered Wound: No Comorbid Cataracts, Hypertension, Peripheral Venous History: Disease, Type II Diabetes,  Gout, Received Radiation Photos Wound Measurements Length: (cm) 4.2 % Reduct Width: (cm) 2.1 % Reduct Depth: (cm) 0.1 Epitheli Area: (cm) 6.927 Tunneli Volume: (cm) 0.693 Undermi Wound Description Full Thickness Without Exposed Support Foul Odo Classification: Structures Slough/F Wound Flat and Intact Margin: Exudate Medium Amount: Exudate Serosanguineous Type: Exudate red, brown Color: Wound Bed Granulation Amount: Large (67-100%) Granulation Quality: Red, Pink Fascia E Necrotic Amount: Small (1-33%) Fat Laye Necrotic Quality: Adherent Slough Tendon E Muscle E Joint Ex Bone Exp r After Cleansing: No ibrino Yes Exposed Structure xposed: No r (Subcutaneous Tissue) Exposed: Yes xposed: No xposed: No posed: No osed: No ion in Area: -8.6% ion in Volume: -8.6% alization: Small (1-33%) ng: No ning: No Treatment Notes Wound #24 (Left, Distal, Anterior Lower Leg) 1. Cleanse With Wound Cleanser Soap and water 3. Primary Dressing Applied Collegen AG 4. Secondary Dressing Dry Gauze 6. Support Layer Psychiatric nurse) Signed: 11/08/2019 4:31:42 PM By: Mikeal Hawthorne EMT/HBOT Signed: 11/08/2019 4:53:37 PM By: [name] RN Previous Signature: 11/07/2019 5:18:10 PM Version By: Baruch Gouty RN, BSN Entered By: Mikeal Hawthorne on 11/08/2019 11:25:13 -------------------------------------------------------------------------------- Wound Assessment Details Patient Name: Date of Service: RONON, FERGER 11/07/2019 8:45 AM Medical Record [id number] Patient Account Number:  [id number] Date of Birth/Sex: Treating RN: 1941/07/09 (78 y.o. [name]) [name] Primary Care Deep Bonawitz: [place] Other Clinician: Referring Laylani Pudwill: Treating Lalanya Rufener/Extender:Robson, Cheryl Flash, [place] Weeks in Treatment: 36 Wound Status Wound Number: 25 Primary Venous Leg Ulcer Etiology: Wound Location: Left Lower Leg - Posterior, Distal Secondary Diabetic Wound/Ulcer of the Lower Extremity Wounding Event: Gradually Appeared Etiology: Date Acquired: 10/04/2019 Wound Open Weeks Of Treatment: 4 Status: Clustered Wound: Yes Comorbid Cataracts, Hypertension, Peripheral Venous History: Disease, Type II Diabetes, Gout, Received Radiation Photos Wound Measurements Length: (cm) 1.9 % Reduct Width: (cm) 1.9 % Reduct Depth: (cm) 0.1 Epitheli Clustered Quantity: 3 Tunnelin Area: (cm) 2.835 Undermi Volume: (cm) 0.284 Wound Description Full Thickness Without Exposed Support Foul Odo Classification: Structures Slough/F Wound Flat and Intact Margin: Exudate Medium Amount: Exudate Serosanguineous Type: Exudate red, brown Color: Wound Bed Granulation Amount: Medium (34-66%) Granulation Quality: Pink Fascia E Necrotic Amount: Medium (34-66%) Fat Laye Necrotic Quality: Eschar, Adherent Slough Tendon E Muscle E Joint Ex Bone Exp r After Cleansing: No ibrino Yes Exposed Structure xposed: No r (Subcutaneous Tissue) Exposed: Yes xposed: No xposed: No posed: No osed: No ion in Area: 88.5% ion in Volume: 88.5% alization: Small (1-33%) g: No ning: No Treatment Notes Wound #25 (Left, Distal, Posterior Lower Leg) 1. Cleanse With Wound Cleanser Soap and water 3. Primary Dressing Applied Collegen AG 4. Secondary Dressing Dry Gauze 6. Support Layer Applied Kerlix/Coban Product manager) Signed: 11/08/2019 4:31:42 PM By: Mikeal Hawthorne EMT/HBOT Signed: 11/08/2019 4:53:37 PM By: [name] RN Previous Signature: 11/07/2019 5:18:10 PM Version  By: Baruch Gouty RN, BSN Entered By: Mikeal Hawthorne on 11/08/2019 11:15:12 -------------------------------------------------------------------------------- Wound Assessment Details Patient Name: Date of Service: ZYAIRE, MCCLEOD 11/07/2019 8:45 AM Medical Record [id number] Patient Account Number: [id number] Date of Birth/Sex: Treating RN: 1940-10-03 (78 y.o. [name]) [name] Primary Care Lindley Stachnik: [place] Other Clinician: Referring Jedi Catalfamo: Treating Agueda Houpt/Extender:Robson, Cheryl Flash, [place] Weeks in Treatment: 36 Wound Status Wound Number: 26 Primary Venous Leg Ulcer Etiology: Wound Location: Left Lower Leg - Medial Secondary Diabetic Wound/Ulcer of the Lower Extremity Wounding Event: Gradually Appeared Etiology: Date Acquired: 10/04/2019 Wound Open Weeks Of Treatment: 4 Status: Clustered Wound: No Comorbid Cataracts, Hypertension, Peripheral Venous History: Disease, Type II Diabetes,  Gout, Received Radiation Photos Wound Measurements Length: (cm) 0 % Reduct Width: (cm) 0 % Reduct Depth: (cm) 0 Epitheli Area: (cm) 0 Tunneli Volume: (cm) 0 Undermi Wound Description Full Thickness Without Exposed Support Foul Odo Classification: Structures Slough/F Wound Flat and Intact Margin: Exudate None Present Amount: Wound Bed Granulation Amount: None Present (0%) Necrotic Amount: None Present (0%) Fascia E Fat Laye Tendon E Muscle E Joint Ex Bone Exp Electronic Signature(s) Signed: 11/08/2019 4:31:42 PM By: Mikeal Hawthorne EMT/HBOT Signed: 11/08/2019 4:53:37 PM By: [name] RN Previous Signature: 11/07/2019 5:18:10 PM Version By: Lourdes Sledge Entered By: Mikeal Hawthorne on 02/10 r After Cleansing: No ibrino No Exposed Structure xposed: No r (Subcutaneous Tissue) Exposed: No xposed: No xposed: No posed: No osed: No Clinical biochemist, BSN /2021 11:24:41 ion in Area: 100% ion in Volume: 100% alization: Large (67-100%) ng: No ning:  No -------------------------------------------------------------------------------- Wound Assessment Details Patient Name: Date of Service: THEO, KRUMHOLZ 11/07/2019 8:45 AM Medical Record [place] Patient Account Number: [id number] Date of Birth/Sex: Treating RN: October 21, 1940 (78 y.o. [name]) [name] Primary Care Shardea Cwynar: [place] Other Clinician: Referring Ivan Lacher: Treating Toshiye Kever/Extender:Robson, Cheryl Flash, [place] Weeks in Treatment: 36 Wound Status Wound Number: 28 Primary Diabetic Wound/Ulcer of the Lower Extremity Etiology: Wound Location: Left Lower Leg - Posterior, Proximal Secondary Venous Leg Ulcer Etiology: Wounding Event: Gradually Appeared Wound Open Date Acquired: 10/31/2019 Status: Weeks Of Treatment: 1 Comorbid Cataracts, Hypertension, Peripheral Venous Clustered Wound: No History: Disease, Type II Diabetes, Gout, Received Radiation Photos Wound Measurements Length: (cm) 1.4 % Reductio Width: (cm) 0.7 % Reductio Depth: (cm) 0.1 Epithelial Area: (cm) 0.77 Tunneling Volume: (cm) 0.077 Undermini Wound Description Classification: Grade 1 Wound Margin: Flat and Intact Exudate Amount: Small Exudate Type: Serosanguineous Exudate Color: red, brown Wound Bed Granulation Amount: Large (67-100%) Granulation Quality: Pink Necrotic Amount: None Present (0%) Foul Odor After Cleansing: No Slough/Fibrino No Exposed Structure Fascia Exposed: No Fat Layer (Subcutaneous Tissue) Exposed: Yes Tendon Exposed: No Muscle Exposed: No Joint Exposed: No Bone Exposed: No n in Area: -30.7% n in Volume: -30.5% ization: Medium (34-66%) : No ng: No Treatment Notes Wound #28 (Left, Proximal, Posterior Lower Leg) 1. Cleanse With Wound Cleanser Soap and water 3. Primary Dressing Applied Collegen AG 4. Secondary Dressing Dry Gauze 6. Support Layer Applied Kerlix/Coban Product manager) Signed: 11/08/2019 4:31:42 PM By: Mikeal Hawthorne EMT/HBOT Signed: 11/08/2019 4:53:37 PM By: [name] RN Previous Signature: 11/07/2019 5:18:10 PM Version By: Baruch Gouty RN, BSN Entered By: Mikeal Hawthorne on 11/08/2019 11:14:45 -------------------------------------------------------------------------------- Wound Assessment Details Patient Name: Date of Service: JAKING, THAYER 11/07/2019 8:45 AM Medical Record [id number] Patient Account Number: [id number] Date of Birth/Sex: Treating RN: Apr 17, 1941 (78 y.o. [name]) [name] Primary Care Bonetta Mostek: [place] Other Clinician: Referring Tico Crotteau: Treating Hailea Eaglin/Extender:Robson, Cheryl Flash, [place] Weeks in Treatment: 36 Wound Status Wound Number: [id number] Primary Diabetic Wound/Ulcer of the Lower Extremity Etiology: Wound Location: Right Calf - Posterior Wound Open Wounding Event: Gradually Appeared Status: Date Acquired: 02/28/2019 Comorbid Cataracts, Hypertension, Peripheral Venous Weeks Of Treatment: 36 History: Disease, Type II Diabetes, Gout, Received Clustered Wound: No Radiation Photos Wound Measurements Length: (cm) 0.6 % Reductio Width: (cm) 0.4 % Reductio Depth: (cm) 0.1 Epithelial Area: (cm) 0.188 Tunneling Volume: (cm) 0.019 Undermini Wound Description Classification: Grade 2 Wound Margin: Distinct, outline attached Exudate Amount: Small Exudate Type: Serosanguineous Exudate Color: red, brown Wound Bed Granulation Amount: Large (67-100%) Granulation Quality: Pink Necrotic Amount: None Present (0%) Foul Odor After Cleansing: No Slough/Fibrino No Exposed  Structure Fascia Exposed: No Fat Layer (Subcutaneous Tissue) Exposed: Yes Tendon Exposed: No Muscle Exposed: No Joint Exposed: No Bone Exposed: No n in Area: 99.2% n in Volume: 99.2% ization: Medium (34-66%) : No ng: No Treatment Notes Wound #3R (Right, Posterior Calf) 1. Cleanse With Wound Cleanser Soap and water 3. Primary Dressing Applied Collegen AG 4. Secondary  Dressing Dry Gauze 6. Support Layer Applied Kerlix/Coban Product manager) Signed: 11/08/2019 4:31:42 PM By: Mikeal Hawthorne EMT/HBOT Signed: 11/08/2019 4:53:37 PM By: [name] RN Previous Signature: 11/07/2019 5:18:10 PM Version By: Baruch Gouty RN, BSN Entered By: Mikeal Hawthorne on 11/08/2019 11:39:56 -------------------------------------------------------------------------------- Vitals Details Patient Name: Date of Service: Daniel Pia D. 11/07/2019 8:45 AM Medical Record [id number] Patient Account Number: [id number] Date of Birth/Sex: Treating RN: 1941/08/19 (79 y.o. [name] Primary Care Blia Totman: [place] Other Clinician: Referring Javier Mamone: Treating Samara Stankowski/Extender:Robson, Cheryl Flash, [place] Weeks in Treatment: 36 Vital Signs Time Taken: 08:53 Temperature (F): 97.5 Height (in): 74 Pulse (bpm): 83 Source: Stated Respiratory Rate (breaths/min): 20 Weight (lbs): 212 Blood Pressure (mmHg): 121/59 Source: Stated Reference Range: 80 - 120 mg / dl Body Mass Index (BMI): 27.2 Electronic Signature(s) Signed: 11/07/2019 5:18:10 PM By: Baruch Gouty RN, BSN Entered By: Baruch Gouty on 11/07/2019 08:55:38

## 2019-11-07 NOTE — Progress Notes (Signed)
[name], [name] ([id number]) Visit Report for 11/07/2019 HPI Details Patient Name: Date of Service: [name], [name] 11/07/2019 8:45 AM Medical Record [place] Patient Account Number: [id number] Date of Birth/Sex: Treating RN: 1941/01/23 (79 y.o. [name]) [name] Primary Care Provider: Hurshel Party Other Clinician: Referring Provider: Treating Provider/Extender:Jacere Pangborn, Cheryl Flash, [place] Weeks in Treatment: 26 History of Present Illness Location: Patient presents with a wound to left lower leg. Quality: Patient reports No Pain. Duration: 2 months HPI Description: no cig or alcohol. spontaneous appearance in area of stasis dermamtitis. [name] on metformin only. chronic afib on Coumadin. diabetes and coag studies not good. hba1c 7.5. ivr 4.5. no pain or sxs of systemic disease. hx chf. no intermittent claudication 02/28/2019 Readmission This is a now a 79 year old man who was previously cared for in 2016 by Dr. Lindon Romp for wounds on his lower extremities. At that point he had venous reflux studies although I cannot seem to open these in [place] link. He had arterial studies showing an ABI of 1.11 on the right and 1.27 on the left his waveforms were triphasic bilaterally. He was discharged in stockings although I do not believe he is wearing these in some time. He tells me that about a month ago he noted openings of a large wound on the posterior right calf and 2 smaller areas on the left lateral calf and a small area more recently on the left posterior calf. He has been dressing these with peroxide and triple antibiotic ointment. He is not wearing compression. Past medical history; type 2 diabetes with peripheral neuropathy, chronic venous insufficiency, hypertension, cardiomyopathy, chronic atrial fibrillation on Coumadin, prostate cancer, hyperlipidemia, gout, ABI in our clinic was 1.34 on the left and not obtainable on the right 6/9; this is a patient who has chronic  venous insufficiency. He has a fairly substantial area on the right posterior calf, left lateral calf and a small area on the left posterior calf. On arrival last week he had very palpable popliteal and femoral pulses but nothing in his bilateral feet. Unfortunately we cannot get arterial studies until July 1 at Dr. Kennon Holter office. They live in [place]. We use silver alginate under Kerlix Coban 6/16; patient with chronic venous insufficiency with wounds on his bilateral lower extremities. When he came into our clinic he was discovered to have a complete absence of peripheral pedal pulses at either the dorsalis pedis or posterior tibial. He does have easily palpable femoral and popliteal pulses. He sees Dr. [name] tomorrow for noninvasive arterial tests. He may also require venous reflux evaluation although I do not view this as an urgent thing. We have been using silver alginate. His wound surfaces of cleaned up quite nicely 6/23; patient with chronic venous insufficiency with wounds on his bilateral lower extremities. His wounds all are somewhat better looking. He did go to Dr. Kennon Holter office but somehow ended up on the doctors schedule rather than being scheduled for noninvasive tests therefore his noninvasive tests are scheduled for July 1. We agree that he has venous insufficiency ulcers but I cannot feel any pulses in his lower extremities dictating the need for test. We are only using Kerlix and light Coban unfortunately this appears to be holding the edema 6/30; has his arterial studies tomorrow. We have been using Kerlix and light Coban will go to a more aggressive compression if the arterial studies will allow. We all agreed these are venous wounds however I cannot feel pulses at either the dorsalis pedis or posterior tibial  bilaterally. His wounds generally look some better including left lateral and right posterior. 7/7-Patient returns at 1 week in Kerlix/Coban to both legs, with  improvement, in the left lateral and right posterior lower leg wounds, ABI's are normal in both legs per vascular studies, TBI is also normal on both sides, we are using hydrofera blue to the wounds 7/14; patient's arterial studies from 2 weeks ago showed an ABI on the right at 1.03 with a TBI of 0.86. On the left the ABI was 1.06 with a TBI of 0.84. Notable for the fact that his arterial waveforms were monophasic in all of the lower extremity arteries suggesting some degree of arterial occlusive disease but in general this was felt to be fairly adequate for healing. His compression was increased from 2-3 layer which is appropriate. Dressing was changed to [facility] 7/21; patient's wounds are measuring smaller. The more substantial one on the right posterior calf, second 1 on the left lateral calf. Using [facility] on both wound areas 7/28; patient continues to make nice improvements. The area on the right posterior calf is smaller. Area on the left lateral calf also is smaller. We have been using Hydrofera Blue under compression. The patient will need compression stockings and we have measured him for these in the eventuality that these heal which really should not be too long from now 8/4-Patient continues to make improvement, the right posterior calf area smaller with rim of keratotic skin on one side, the left wound is definitely smaller and improving. 8/11-Returns at 1 week, after being in 3 layer compression on both legs, both wounds appear to be improving, making good progress, patient is happy, pain is also less especially in the right leg wound 8/18; the area on the left anterior lower leg is healed. On the right posterior leg the wound remains although the dimensions are a lot better. 8/25; he arrives in clinic today with a large body of open wound on the left lateral calf. All of the 3 wounds in this area are in close juxtaposition to each other. The story is that we  discharged him last week with no a wrap on the left leg. They went to [facility] could not get in as they are only excepting phone orders or online orders for stockings hence they did not put any stocking on the left leg all week. They have something at home but the patient with that was either incapable or just did not put them on. Apparently these opened 1 morning after getting out of bed. The area on the right has no real change 9/1; patient has bilateral lower extremity wounds in the setting of severe chronic venous insufficiency and secondary lymphedema. He arrived last week with new areas on the left lateral lower leg after we did not wrap him and he did not use his stockings. Nevertheless the areas on the left look better today under compression. Posterior right calf does not really changed. We are using Hydrofera Blue on both areas under compression 9/15; bilateral lower extremity wounds in the setting of severe chronic venous insufficiency and secondary lymphedema. He has 20 to 30 mmHg below-knee compression stockings under the eventuality that these close over. We did get the left leg to close but he did not transition to a stocking and this reopened. There are 2 open areas on the left posterior lateral calf and one on the right. Both of these look satisfactory. Using [facility] 9/22; bilateral lower extremity wounds in the  setting of chronic venous insufficiency. 2 superficial areas on the left lateral calf. One on the right just above the Achilles area. We have good edema control we have been using Hydrofera Blue 9/29; the areas on the left lateral calf are healed. On the right just above the Achilles and tendon area things look a lot better small wounds one scabbed area. We have been using Hydrofera Blue. We can discharge him in his own stocking on the left still wrapping on the right. This is the second time we have healed the left leg but he did not put a stocking on last time.  Hopefully this will maintain the edema from chronic venous disease with secondary lymphedema 10/6; he comes in today having a stocking on the left leg. They had trouble getting it on there is a lot of increase in swelling 2 small open areas one anteriorly and one on the medial calf. They report a lot of difficulty getting the stocking on. Paradoxically the area on the right that we have been wrapping posteriorly is closed 10/13; he comes in today with wounds bilaterally including superficial areas on the left medial and left lateral calf. As well as the right posterior has reopened in the Achilles area superiorly. He still does not have his juxta lite stockings although truthfully we would not of been able to use them today anyway. Apparently have been ordered and paid for from [employer] although they have not been delivered 10/20; his area on the right is just the boat closed on the right posterior. Still has the area on the left lateral and a very tiny area on the left medial. He has his bilateral juxta lites although he is not ready for them this week. He tolerated the increase to 4 layer compression last week quite well 10/27; the area on the right posterior calf is once again closed. He has a superficial area on the left lateral calf that is still open. He has been using Hydrofera Blue and bilateral 4-layer compression. He can change to his own juxta lite stocking on the right and we are instructing him today 11/3; the area on the right posterior calf reopened according to the patient and his wife after they took off the stocking when they got home last week. Apparently scabbed over there is now a fairly substantial wound which looks pretty much the same. Our intake nurse noted that they were using the juxta lite stockings appropriately. I was really hoping I might be able to close him out today. He has 1 very tiny remaining area on the left lateral lower leg. 11/10; right posterior calf wound  measures smaller but is still open. We have been using Hydrofera Blue. On the left he has a small oval-shaped wound and he seems to have had another wound distally that is open and likely a blister. We are using Hydrofera Blue under compression 11/17; right posterior calf wound continues to get better. We have been using Hydrofera Blue. On the left lateral one of the wounds has closed still a small open area. We have been using Hydrofera Blue on this as well. Both areas have been under 4-layer compression Arrives in clinic today with some swelling in the dorsal foot on the right some erythema of his forefoot and toes. Initially when I looked at this I almost thought this was a sunburn distal to a wrap injury. 12/1; right posterior calf wound debrided with a curette. We have been using Hydrofera Blue on the  left anterior lateral he has an area across the mid tibia. Finally a small area on the left lateral lower calf. Finally he continues to have de-epithelialized areas on the dorsal aspect of his toes. Initially thought this might be a burn injury when I saw him 2 weeks ago. I now wonder about tinea. I have also reviewed his arterial studies which were really quite good in July/20 with normal TBI's and ABIs but monophasic waveforms 12/8; comes in today with worsening problems especially on the left leg where he now has a cluster of wounds in the left anterior mid tibia. Very poor edema control. I reduced him to 3 layer from 4 layer compression last week because of some concern about blood flow to his toes however he does not have good edema control on the left leg. Right leg edema control looks satisfactory. On the left he has a cluster of wounds anteriorly, small area on the left medial fifth met head and then the collection of areas on his toes which appear better On the right he has the original area on the right posterior calf, a new area right medially. His formal arterial studies from mid  July are noted below. He was evaluated by Dr.Berry ABI Findings: +---------+------------------+-----+----------+--------+ Right Rt Pressure (mmHg)IndexWaveform Comment  +---------+------------------+-----+----------+--------+ Brachial 176     +---------+------------------+-----+----------+--------+ ATA 176 0.99 monophasic  +---------+------------------+-----+----------+--------+ PTA 183 1.03 monophasic  +---------+------------------+-----+----------+--------+ PERO 172 0.97 monophasic  +---------+------------------+-----+----------+--------+ Great Toe153 0.86    +---------+------------------+-----+----------+--------+ +---------+------------------+-----+-----------+-------+ Left Lt Pressure (mmHg)IndexWaveform Comment +---------+------------------+-----+-----------+-------+ Brachial 178     +---------+------------------+-----+-----------+-------+ ATA 162 0.91 multiphasic  +---------+------------------+-----+-----------+-------+ PTA 188 1.06 multiphasic  +---------+------------------+-----+-----------+-------+ PERO 158 0.89 monophasic   +---------+------------------+-----+-----------+-------+ Great Toe150 0.84    +---------+------------------+-----+-----------+-------+ +-------+-----------+-----------+------------+------------+ ABI/TBIToday's ABIToday's TBIPrevious ABIPrevious TBI +-------+-----------+-----------+------------+------------+ Right 1.03 0.86 1.11   +-------+-----------+-----------+------------+------------+ Left 1.06 0.84 1.27   +-------+-----------+-----------+------------+------------+ Tibial waveforms somewhat difficult to record due to irregular heartbeat. Bilateral ABIs appear essentially unchanged compared to prior study on 06/21/15. Summary: Right: Resting right ankle-brachial index is within normal range. No evidence of significant right lower extremity arterial disease.  The right toe-brachial index is normal. Although ankle brachial indices are within normal limits (0.95-1.29), arterial Doppler waveforms at the ankle suggest some component of arterial occlusive disease. Left: Resting left ankle-brachial index is within normal range. No evidence of significant left lower extremity arterial disease. The left toe-brachial index is normal. Although ankle brachial indices are within normal limits (0.95-1.29), arterial Doppler waveforms at the ankle suggest some component of arterial occlusive disease. 12/15; the patient's area on the left mid tibia looks better. Right posterior calf also better. He has the area on the left foot as well. All of his toes look better I think this was tinea. We are using Hydrofera Blue everywhere else The patient was in urgent care yesterday with wheezing and shortness of breath. He got azithromycin and prednisone. He feels better. His lungs are currently clear to auscultation. He was not tested for Covid 19 12/29; the patient arrives in clinic today with quite a bit change. 2 weeks ago he only had areas on the left mid tibia right posterior calf with tinea pedis resolving between his toes. He arrives in clinic today with several areas on the dorsal toes on the right, dorsal left second toe. He has skin breakdown in the left medial calf probably from excess edema. Small area proximally in the medial calf. He has weeping edema fluid coming out of the skin excoriations on the left medial calf. He tells  me that he is having a cardiac catheterization next week. I had a quick look at [facility] health link. He was found to have an ejection fraction of 25% during the work-up for persistent atrial fibrillation. He saw his cardiology office yesterday seen by the nurse practitioner. She increased his carvedilol. He has not been on diuretics for apparently several months and indeed in the nurse practitioner Dietrich Pates notes she had knowledge of  this. 10/04/2019 on evaluation today patient presents as a walk-in visit concerning the fact that he did not have an appointment here for our clinic at this point. He actually had a cardiac catheterization earlier today and then came from there to here in order to be evaluated. With that being said unfortunately he is having significant cellulitis of his left lower extremity upon evaluation today this appears to have deteriorated even since last week's evaluation with Dr. Dellia Nims. The right lower extremity is actually doing okay I really see no evidence of deterioration at this point at those locations. In fact the right leg seems in general be doing quite well. Nonetheless I am concerned about infection and cellulitis of the left lower extremity and again considering his weakened heart I do not want him to develop into sepsis at all. He is also having some trouble breathing today and I understand according to nursing staff this is always the case to some degree. With that being said the patient unfortunately seems to be in my opinion a little bit worse even his wife feels like that may be the case today. Unsure exactly what is leading to this. Cardiac catheterization I did review the report which showed an ejection fraction of 25% he also had an LAD blockage of around 25% based on what I saw. With that being said there was no significant blockages that required stenting at this point he does have weakened cardiac muscles compared to normal. 10/05/2019; patient was seen yesterday in clinic. He was sent to the ER because of cellulitis of the left leg possibility. In the ER he was given 1 dose of IV Levaquin and discharged on Keflex. He came in the clinic initially for a nurse visit to rewrap his left leg. We did not look at the right leg today that is an Haematologist. The patient also had a cardiac cath. According to him there were no blockages but a very low ejection fraction. 1/12; back for an early  follow-up. The condition of the left lower leg is a lot better although there are multiple open areas. All of them with not a particularly viable surface. On the right posterior calf he has a single wound with a clean surface. He has a wound with exposed bone on the PIP of the left second toe dorsally. He has wounds on the dorsal right first second and third toes. His arterial studies were normal. 1/19; the patient has 3 open wounds on the left leg anteriorly in the mid tibia, distally and medially just above the ankle and posteriorly. On the right he has a small area dime sized on the right posterior calf. His edema control is a lot better. He still has wounds on the right second and left second toes. The left second toe has exposed bone. X- ray I did last week did not show evidence of osteomyelitis in the left foot. 1/26; patient with a multiplicity of wounds and problems. On the left he has a circular area on the left anterior mid tibia Left just above  the medical ankle -left 2nd toe pip -right 2nd toe right posterior calf 2/2; patient with a multiplicity of wounds and problems. He has severe chronic venous insufficiency and the wounds on his leg are all on the left left anterior left medial at the medial malleolus and left posterior. We have been using Iodoflex to these areas to help with ongoing debridement. He also has largely traumatic wounds on his toes this includes the left second with exposed bone. Bone culture I did last week showed Staph aureus which is methicillin sensitive. I discussed with him today the idea of an amputation of this toe because it is literally nonfunctional however he wants to try antibiotics. Antibiotic choice is complicated by the fact that he is on Coumadin. He also has wounds over the dorsal part of the right first which is close to closed. Right second toe has exposed bone and the right third toe at the base of the right third toe is just about closed as  well. We have been using silver alginate 2/9; patient with severe chronic venous insufficiency. He has large wounds on the left anterior mid tibia and on the left medial just above the ankle. I am concerned today about the depth of the area on the left mid tibia may be exposing into the muscle. He has small areas on the left posterior calf and on the right posterior calf although these do not look as threatening. He also has traumatic wounds on his toes. The right first and third are closed however the bilateral second toes have exposed bone. The area on the left is open into the distal interphalangeal joint. In my opinion these toes are nonsalvageable and will need amputation Electronic Signature(s) Signed: 11/07/2019 5:08:08 PM By: [name] MD Entered By: [name] on 11/07/2019 11:41:00 -------------------------------------------------------------------------------- Physical Exam Details Patient Name: Date of Service: [name], [name] 11/07/2019 8:45 AM Medical Record [id number] Patient Account Number: [id number] Date of Birth/Sex: Treating RN: 1941-01-28 (79 y.o. [name]) [name] Primary Care Provider: Hurshel Party Other Clinician: Referring Provider: Treating Provider/Extender:Seve Monette, Cheryl Flash, [place] Weeks in Treatment: 36 Constitutional Sitting or standing Blood Pressure is within target range for patient.. Pulse regular and within target range for patient.Marland Kitchen Respirations regular, non-labored and within target range.. Temperature is normal and within the target range for the patient.Marland Kitchen Appears in no distress. Notes Wound exam; Left anterior leg wounds required no debridement however I am concerned about the depth of the mid area. This may be showing through muscle. No debridement. Left medial calf just above the ankle this appears clean no debridement is required Left second toe has white exposed bone and opening into the joint. Right second toe necrotic  has exposed bone. Posterior calf wounds look satisfactory on the left than the right Electronic Signature(s) Signed: 11/07/2019 5:08:08 PM By: [name] MD Entered By: [name] on 11/07/2019 11:42:28 -------------------------------------------------------------------------------- Physician Orders Details Patient Name: Date of Service: [name], [name] 11/07/2019 8:45 AM Medical Record [id number] Patient Account Number: [id number] Date of Birth/Sex: Treating RN: [birth year] (79 y.o. [name]) Dolores Lory, [place] Primary Care Provider: Hurshel Party Other Clinician: Referring Provider: Treating Provider/Extender:Tyreonna Czaplicki, Cheryl Flash, [place] Weeks in Treatment: 21 Verbal / Phone Orders: No Diagnosis Coding ICD-10 Coding Code Description E11.51 Type 2 diabetes mellitus with diabetic peripheral angiopathy without gangrene L97.211 Non-pressure chronic ulcer of right calf limited to breakdown of skin E11.42 Type 2 diabetes mellitus with diabetic polyneuropathy L97.221 Non-pressure chronic ulcer of left calf limited to breakdown of skin I87.333  Chronic venous hypertension (idiopathic) with ulcer and inflammation of bilateral lower extremity L97.524 Non-pressure chronic ulcer of other part of left foot with necrosis of bone L97.511 Non-pressure chronic ulcer of other part of right foot limited to breakdown of skin X09.40 Chronic systolic (congestive) heart failure H68.088 Other chronic osteomyelitis, left ankle and foot Follow-up Appointments Return Appointment in 1 week. - Tuesday **********EXTRA TIME**************** Dressing Change Frequency Wound #13 Left Toe Second Change dressing every day. Wound #19 Left,Anterior Lower Leg Do not change entire dressing for one week. Wound #20 Right Toe Great Change dressing every day. Wound #21 Right Toe Second Change dressing every day. Wound #24 Left,Distal,Anterior Lower Leg Do not change entire dressing for one week. Wound #25  Left,Distal,Posterior Lower Leg Do not change entire dressing for one week. Wound #3R Right,Posterior Calf Do not change entire dressing for one week. Skin Barriers/Peri-Wound Care TCA Cream or Ointment Wound Cleansing May shower with protection. Primary Wound Dressing Wound #13 Left Toe Second Calcium Alginate with Silver Wound #19 Left,Anterior Lower Leg Silver Collagen Wound #20 Right Toe Great Calcium Alginate with Silver Wound #21 Right Toe Second Calcium Alginate with Silver Wound #24 Left,Distal,Anterior Lower Leg Silver Collagen Wound #25 Left,Distal,Posterior Lower Leg Silver Collagen Wound #3R Right,Posterior Calf Silver Collagen Secondary Dressing Dry Gauze - secure toes with tape Edema Control Unna Boots Bilaterally Off-Loading Open toe surgical shoe to: - left and right foot Consults Podiatry - [facility] ref for possible amputations bilat 2nd toes - (ICD10 L97.524 - Non-pressure chronic ulcer of other part of left foot with necrosis of bone) Electronic Signature(s) Signed: 11/07/2019 5:08:08 PM By: [name] MD Signed: 11/07/2019 5:19:34 PM By: [name] RN Entered By: [name] on 11/07/2019 09:52:46 -------------------------------------------------------------------------------- Prescription 11/07/2019 Patient Name: Daniel Pia D. Provider: [name] MD Date of Birth: 03-19-1941 NPI#: [id number] Sex: M DEA#: [id number] Phone #: [phone] License #: [id number] Patient Address: [place] [address] [place], [place] [address] [address] [place], [place] [address] [id number] Allergies clarithromycin Reaction: body aches Severity: Moderate Fluoride Preparations Severity: Moderate Provider's Orders Podiatry - ICD10: L97.524 - [facility] ref for possible amputations bilat 2nd toes Signature(s): Date(s): Electronic Signature(s) Signed: 11/07/2019 5:08:08  PM By: [name] MD Signed: 11/07/2019 5:19:34 PM By: [name] RN Entered By: [name] on 11/07/2019 09:52:49 --------------------------------------------------------------------------------  Problem List Details Patient Name: Date of Service: Daniel Pia D. 11/07/2019 8:45 AM Medical Record [id number] Patient Account Number: [id number] Date of Birth/Sex: Treating RN: 14-Sep-1941 (79 y.o. [name]) Dolores Lory, [place] Primary Care Provider: Hurshel Party Other Clinician: Referring Provider: Treating Provider/Extender:Alzina Golda, Cheryl Flash, Clawson Weeks in Treatment: 36 Active Problems ICD-10 Evaluated Encounter Code Description Active Date Today Diagnosis E11.51 Type 2 diabetes mellitus with diabetic peripheral 02/28/2019 No Yes angiopathy without gangrene L97.211 Non-pressure chronic ulcer of right calf limited to 02/28/2019 No Yes breakdown of skin E11.42 Type 2 diabetes mellitus with diabetic polyneuropathy 02/28/2019 No Yes L97.221 Non-pressure chronic ulcer of left calf limited to 02/28/2019 No Yes breakdown of skin I87.333 Chronic venous hypertension (idiopathic) with ulcer 02/28/2019 No Yes and inflammation of bilateral lower extremity L97.524 Non-pressure chronic ulcer of other part of left foot 10/10/2019 No Yes with necrosis of bone L97.511 Non-pressure chronic ulcer of other part of right foot 09/26/2019 No Yes limited to breakdown of skin Y61.68 Chronic systolic (congestive) heart failure 10/05/2019 No Yes M86.672 Other chronic osteomyelitis, left ankle and foot 10/31/2019 No Yes  Inactive Problems ICD-10 Code Description Active Date Inactive Date B35.3 Tinea pedis 09/05/2019 09/05/2019 L97.521 Non-pressure chronic ulcer of other part of left foot limited to 09/26/2019 09/26/2019 breakdown of skin Resolved Problems Electronic Signature(s) Signed: 11/07/2019 5:08:08 PM By: [name] MD Entered By: [name] on 11/07/2019  11:39:21 -------------------------------------------------------------------------------- Progress Note Details Patient Name: Date of Service: Daniel Pia D. 11/07/2019 8:45 AM Medical Record [id number] Patient Account Number: [id number] Date of Birth/Sex: Treating RN: [birth year] (79 y.o. [name]) [name] Primary Care Provider: Other Clinician: Hurshel Party Referring Provider: Treating Provider/Extender:Teren Franckowiak, Cheryl Flash, Coleman Weeks in Treatment: 36 Subjective History of Present Illness (HPI) The following HPI elements were documented for the patient's wound: Location: Patient presents with a wound to left lower leg. Quality: Patient reports No Pain. Duration: 2 months no cig or alcohol. spontaneous appearance in area of stasis dermamtitis. [name] on metformin only. chronic afib on Coumadin. diabetes and coag studies not good. hba1c 7.5. ivr 4.5. no pain or sxs of systemic disease. hx chf. no intermittent claudication 02/28/2019 Readmission This is a now a 79 year old man who was previously cared for in 2016 by Dr. Lindon Romp for wounds on his lower extremities. At that point he had venous reflux studies although I cannot seem to open these in  link. He had arterial studies showing an ABI of 1.11 on the right and 1.27 on the left his waveforms were triphasic bilaterally. He was discharged in stockings although I do not believe he is wearing these in some time. He tells me that about a month ago he noted openings of a large wound on the posterior right calf and 2 smaller areas on the left lateral calf and a small area more recently on the left posterior calf. He has been dressing these with peroxide and triple antibiotic ointment. He is not wearing compression. Past medical history; type 2 diabetes with peripheral neuropathy, chronic venous insufficiency, hypertension, cardiomyopathy, chronic atrial fibrillation on Coumadin, prostate cancer, hyperlipidemia,  gout, ABI in our clinic was 1.34 on the left and not obtainable on the right 6/9; this is a patient who has chronic venous insufficiency. He has a fairly substantial area on the right posterior calf, left lateral calf and a small area on the left posterior calf. On arrival last week he had very palpable popliteal and femoral pulses but nothing in his bilateral feet. Unfortunately we cannot get arterial studies until July 1 at Dr. Kennon Holter office. They live in [place]. We use silver alginate under Kerlix Coban 6/16; patient with chronic venous insufficiency with wounds on his bilateral lower extremities. When he came into our clinic he was discovered to have a complete absence of peripheral pedal pulses at either the dorsalis pedis or posterior tibial. He does have easily palpable femoral and popliteal pulses. He sees Dr. [name] tomorrow for noninvasive arterial tests. He may also require venous reflux evaluation although I do not view this as an urgent thing. We have been using silver alginate. His wound surfaces of cleaned up quite nicely 6/23; patient with chronic venous insufficiency with wounds on his bilateral lower extremities. His wounds all are somewhat better looking. He did go to Dr. Kennon Holter office but somehow ended up on the doctors schedule rather than being scheduled for noninvasive tests therefore his noninvasive tests are scheduled for July 1. We agree that he has venous insufficiency ulcers but I cannot feel any pulses in his lower extremities dictating the need for test. We are only using Kerlix and light Coban  unfortunately this appears to be holding the edema 6/30; has his arterial studies tomorrow. We have been using Kerlix and light Coban will go to a more aggressive compression if the arterial studies will allow. We all agreed these are venous wounds however I cannot feel pulses at either the dorsalis pedis or posterior tibial bilaterally. His wounds generally look some  better including left lateral and right posterior. 7/7-Patient returns at 1 week in Kerlix/Coban to both legs, with improvement, in the left lateral and right posterior lower leg wounds, ABI's are normal in both legs per vascular studies, TBI is also normal on both sides, we are using hydrofera blue to the wounds 7/14; patient's arterial studies from 2 weeks ago showed an ABI on the right at 1.03 with a TBI of 0.86. On the left the ABI was 1.06 with a TBI of 0.84. Notable for the fact that his arterial waveforms were monophasic in all of the lower extremity arteries suggesting some degree of arterial occlusive disease but in general this was felt to be fairly adequate for healing. His compression was increased from 2-3 layer which is appropriate. Dressing was changed to [facility] 7/21; patient's wounds are measuring smaller. The more substantial one on the right posterior calf, second 1 on the left lateral calf. Using [facility] on both wound areas 7/28; patient continues to make nice improvements. The area on the right posterior calf is smaller. Area on the left lateral calf also is smaller. We have been using Hydrofera Blue under compression. The patient will need compression stockings and we have measured him for these in the eventuality that these heal which really should not be too long from now 8/4-Patient continues to make improvement, the right posterior calf area smaller with rim of keratotic skin on one side, the left wound is definitely smaller and improving. 8/11-Returns at 1 week, after being in 3 layer compression on both legs, both wounds appear to be improving, making good progress, patient is happy, pain is also less especially in the right leg wound 8/18; the area on the left anterior lower leg is healed. On the right posterior leg the wound remains although the dimensions are a lot better. 8/25; he arrives in clinic today with a large body of open wound on the left  lateral calf. All of the 3 wounds in this area are in close juxtaposition to each other. The story is that we discharged him last week with no a wrap on the left leg. They went to [facility] could not get in as they are only excepting phone orders or online orders for stockings hence they did not put any stocking on the left leg all week. They have something at home but the patient with that was either incapable or just did not put them on. Apparently these opened 1 morning after getting out of bed. The area on the right has no real change 9/1; patient has bilateral lower extremity wounds in the setting of severe chronic venous insufficiency and secondary lymphedema. He arrived last week with new areas on the left lateral lower leg after we did not wrap him and he did not use his stockings. Nevertheless the areas on the left look better today under compression. Posterior right calf does not really changed. We are using Hydrofera Blue on both areas under compression 9/15; bilateral lower extremity wounds in the setting of severe chronic venous insufficiency and secondary lymphedema. He has 20 to 30 mmHg below-knee compression stockings under the  eventuality that these close over. We did get the left leg to close but he did not transition to a stocking and this reopened. There are 2 open areas on the left posterior lateral calf and one on the right. Both of these look satisfactory. Using [facility] 9/22; bilateral lower extremity wounds in the setting of chronic venous insufficiency. 2 superficial areas on the left lateral calf. One on the right just above the Achilles area. We have good edema control we have been using Hydrofera Blue 9/29; the areas on the left lateral calf are healed. On the right just above the Achilles and tendon area things look a lot better small wounds one scabbed area. We have been using Hydrofera Blue. We can discharge him in his own stocking on the left still wrapping on  the right. This is the second time we have healed the left leg but he did not put a stocking on last time. Hopefully this will maintain the edema from chronic venous disease with secondary lymphedema 10/6; he comes in today having a stocking on the left leg. They had trouble getting it on there is a lot of increase in swelling 2 small open areas one anteriorly and one on the medial calf. They report a lot of difficulty getting the stocking on. ooParadoxically the area on the right that we have been wrapping posteriorly is closed 10/13; he comes in today with wounds bilaterally including superficial areas on the left medial and left lateral calf. As well as the right posterior has reopened in the Achilles area superiorly. He still does not have his juxta lite stockings although truthfully we would not of been able to use them today anyway. Apparently have been ordered and paid for from [employer] although they have not been delivered 10/20; his area on the right is just the boat closed on the right posterior. Still has the area on the left lateral and a very tiny area on the left medial. He has his bilateral juxta lites although he is not ready for them this week. He tolerated the increase to 4 layer compression last week quite well 10/27; the area on the right posterior calf is once again closed. He has a superficial area on the left lateral calf that is still open. He has been using Hydrofera Blue and bilateral 4-layer compression. He can change to his own juxta lite stocking on the right and we are instructing him today 11/3; the area on the right posterior calf reopened according to the patient and his wife after they took off the stocking when they got home last week. Apparently scabbed over there is now a fairly substantial wound which looks pretty much the same. Our intake nurse noted that they were using the juxta lite stockings appropriately. I was really hoping I might be able to close him  out today. He has 1 very tiny remaining area on the left lateral lower leg. 11/10; right posterior calf wound measures smaller but is still open. We have been using Hydrofera Blue. On the left he has a small oval-shaped wound and he seems to have had another wound distally that is open and likely a blister. We are using Hydrofera Blue under compression 11/17; right posterior calf wound continues to get better. We have been using Hydrofera Blue. On the left lateral one of the wounds has closed still a small open area. We have been using Hydrofera Blue on this as well. Both areas have been under 4-layer compression Arrives  in clinic today with some swelling in the dorsal foot on the right some erythema of his forefoot and toes. Initially when I looked at this I almost thought this was a sunburn distal to a wrap injury. 12/1; right posterior calf wound debrided with a curette. We have been using Hydrofera Blue on the left anterior lateral he has an area across the mid tibia. Finally a small area on the left lateral lower calf. Finally he continues to have de-epithelialized areas on the dorsal aspect of his toes. Initially thought this might be a burn injury when I saw him 2 weeks ago. I now wonder about tinea. I have also reviewed his arterial studies which were really quite good in July/20 with normal TBI's and ABIs but monophasic waveforms 12/8; comes in today with worsening problems especially on the left leg where he now has a cluster of wounds in the left anterior mid tibia. Very poor edema control. I reduced him to 3 layer from 4 layer compression last week because of some concern about blood flow to his toes however he does not have good edema control on the left leg. Right leg edema control looks satisfactory. ooOn the left he has a cluster of wounds anteriorly, small area on the left medial fifth met head and then the collection of areas on his toes which appear better ooOn the right he  has the original area on the right posterior calf, a new area right medially. His formal arterial studies from mid July are noted below. He was evaluated by Dr.Berry ABI Findings: +---------+------------------+-----+----------+--------+ Right Rt Pressure (mmHg)IndexWaveform Comment  +---------+------------------+-----+----------+--------+ Brachial 176     +---------+------------------+-----+----------+--------+ ATA 176 0.99 monophasic  +---------+------------------+-----+----------+--------+ PTA 183 1.03 monophasic  +---------+------------------+-----+----------+--------+ PERO 172 0.97 monophasic  +---------+------------------+-----+----------+--------+ Great Toe153 0.86    +---------+------------------+-----+----------+--------+ +---------+------------------+-----+-----------+-------+ Left Lt Pressure (mmHg)IndexWaveform Comment +---------+------------------+-----+-----------+-------+ Brachial 178     +---------+------------------+-----+-----------+-------+ ATA 162 0.91 multiphasic  +---------+------------------+-----+-----------+-------+ PTA 188 1.06 multiphasic  +---------+------------------+-----+-----------+-------+ PERO 158 0.89 monophasic   +---------+------------------+-----+-----------+-------+ Great Toe150 0.84    +---------+------------------+-----+-----------+-------+ +-------+-----------+-----------+------------+------------+ ABI/TBIToday's ABIToday's TBIPrevious ABIPrevious TBI +-------+-----------+-----------+------------+------------+ Right 1.03 0.86 1.11   +-------+-----------+-----------+------------+------------+ Left 1.06 0.84 1.27   +-------+-----------+-----------+------------+------------+ Tibial waveforms somewhat difficult to record due to irregular heartbeat. Bilateral ABIs appear essentially unchanged compared to prior study on 06/21/15. Summary: Right: Resting  right ankle-brachial index is within normal range. No evidence of significant right lower extremity arterial disease. The right toe-brachial index is normal. Although ankle brachial indices are within normal limits (0.95-1.29), arterial Doppler waveforms at the ankle suggest some component of arterial occlusive disease. Left: Resting left ankle-brachial index is within normal range. No evidence of significant left lower extremity arterial disease. The left toe-brachial index is normal. Although ankle brachial indices are within normal limits (0.95-1.29), arterial Doppler waveforms at the ankle suggest some component of arterial occlusive disease. 12/15; the patient's area on the left mid tibia looks better. Right posterior calf also better. He has the area on the left foot as well. All of his toes look better I think this was tinea. We are using Hydrofera Blue everywhere else The patient was in urgent care yesterday with wheezing and shortness of breath. He got azithromycin and prednisone. He feels better. His lungs are currently clear to auscultation. He was not tested for Covid 19 12/29; the patient arrives in clinic today with quite a bit change. 2 weeks ago he only had areas on the left mid tibia right posterior calf with tinea pedis resolving between his toes. He arrives  in clinic today with several areas on the dorsal toes on the right, dorsal left second toe. He has skin breakdown in the left medial calf probably from excess edema. Small area proximally in the medial calf. He has weeping edema fluid coming out of the skin excoriations on the left medial calf. He tells me that he is having a cardiac catheterization next week. I had a quick look at [facility] health link. He was found to have an ejection fraction of 25% during the work-up for persistent atrial fibrillation. He saw his cardiology office yesterday seen by the nurse practitioner. She increased his carvedilol. He has not been on  diuretics for apparently several months and indeed in the nurse practitioner Dietrich Pates notes she had knowledge of this. 10/04/2019 on evaluation today patient presents as a walk-in visit concerning the fact that he did not have an appointment here for our clinic at this point. He actually had a cardiac catheterization earlier today and then came from there to here in order to be evaluated. With that being said unfortunately he is having significant cellulitis of his left lower extremity upon evaluation today this appears to have deteriorated even since last week's evaluation with Dr. Dellia Nims. The right lower extremity is actually doing okay I really see no evidence of deterioration at this point at those locations. In fact the right leg seems in general be doing quite well. Nonetheless I am concerned about infection and cellulitis of the left lower extremity and again considering his weakened heart I do not want him to develop into sepsis at all. He is also having some trouble breathing today and I understand according to nursing staff this is always the case to some degree. With that being said the patient unfortunately seems to be in my opinion a little bit worse even his wife feels like that may be the case today. Unsure exactly what is leading to this. Cardiac catheterization I did review the report which showed an ejection fraction of 25% he also had an LAD blockage of around 25% based on what I saw. With that being said there was no significant blockages that required stenting at this point he does have weakened cardiac muscles compared to normal. 10/05/2019; patient was seen yesterday in clinic. He was sent to the ER because of cellulitis of the left leg possibility. In the ER he was given 1 dose of IV Levaquin and discharged on Keflex. He came in the clinic initially for a nurse visit to rewrap his left leg. We did not look at the right leg today that is an Haematologist. The patient also had a  cardiac cath. According to him there were no blockages but a very low ejection fraction. 1/12; back for an early follow-up. The condition of the left lower leg is a lot better although there are multiple open areas. All of them with not a particularly viable surface. On the right posterior calf he has a single wound with a clean surface. He has a wound with exposed bone on the PIP of the left second toe dorsally. He has wounds on the dorsal right first second and third toes. His arterial studies were normal. 1/19; the patient has 3 open wounds on the left leg anteriorly in the mid tibia, distally and medially just above the ankle and posteriorly. On the right he has a small area dime sized on the right posterior calf. His edema control is a lot better. He still has wounds on the  right second and left second toes. The left second toe has exposed bone. X- ray I did last week did not show evidence of osteomyelitis in the left foot. 1/26; patient with a multiplicity of wounds and problems. ooOn the left he has a circular area on the left anterior mid tibia ooLeft just above the medical ankle -left 2nd toe pip -right 2nd toe right posterior calf 2/2; patient with a multiplicity of wounds and problems. He has severe chronic venous insufficiency and the wounds on his leg are all on the left left anterior left medial at the medial malleolus and left posterior. We have been using Iodoflex to these areas to help with ongoing debridement. He also has largely traumatic wounds on his toes this includes the left second with exposed bone. Bone culture I did last week showed Staph aureus which is methicillin sensitive. I discussed with him today the idea of an amputation of this toe because it is literally nonfunctional however he wants to try antibiotics. Antibiotic choice is complicated by the fact that he is on Coumadin. He also has wounds over the dorsal part of the right first which is close to closed.  Right second toe has exposed bone and the right third toe at the base of the right third toe is just about closed as well. We have been using silver alginate 2/9; patient with severe chronic venous insufficiency. He has large wounds on the left anterior mid tibia and on the left medial just above the ankle. I am concerned today about the depth of the area on the left mid tibia may be exposing into the muscle. He has small areas on the left posterior calf and on the right posterior calf although these do not look as threatening. He also has traumatic wounds on his toes. The right first and third are closed however the bilateral second toes have exposed bone. The area on the left is open into the distal interphalangeal joint. In my opinion these toes are nonsalvageable and will need amputation Objective Constitutional Sitting or standing Blood Pressure is within target range for patient.. Pulse regular and within target range for patient.Marland Kitchen Respirations regular, non-labored and within target range.. Temperature is normal and within the target range for the patient.Marland Kitchen Appears in no distress. Vitals Time Taken: 8:53 AM, Height: 74 in, Source: Stated, Weight: 212 lbs, Source: Stated, BMI: 27.2, Temperature: 97.5 F, Pulse: 83 bpm, Respiratory Rate: 20 breaths/min, Blood Pressure: 121/59 mmHg. General Notes: Wound exam; ooLeft anterior leg wounds required no debridement however I am concerned about the depth of the mid area. This may be showing through muscle. No debridement. ooLeft medial calf just above the ankle this appears clean no debridement is required ooLeft second toe has white exposed bone and opening into the joint. Right second toe necrotic has exposed bone. ooPosterior calf wounds look satisfactory on the left than the right Integumentary (Hair, Skin) Wound #13 status is Open. Original cause of wound was Gradually Appeared. The wound is located on the Left Toe Second. The wound  measures 1.7cm length x 1.9cm width x 0.5cm depth; 2.537cm^2 area and 1.268cm^3 volume. There is bone, joint, and Fat Layer (Subcutaneous Tissue) Exposed exposed. There is no tunneling or undermining noted. There is a medium amount of serosanguineous drainage noted. The wound margin is distinct with the outline attached to the wound base. There is no granulation within the wound bed. There is a large (67-100%) amount of necrotic tissue within the wound bed including Eschar  and Adherent Slough. Wound #19 status is Open. Original cause of wound was Gradually Appeared. The wound is located on the Left,Anterior Lower Leg. The wound measures 2.3cm length x 1.4cm width x 0.1cm depth; 2.529cm^2 area and 0.253cm^3 volume. There is Fat Layer (Subcutaneous Tissue) Exposed exposed. There is no tunneling or undermining noted. There is a medium amount of serosanguineous drainage noted. The wound margin is flat and intact. There is large (67-100%) pink granulation within the wound bed. There is a small (1-33%) amount of necrotic tissue within the wound bed including Adherent Slough. Wound #20 status is Open. Original cause of wound was Gradually Appeared. The wound is located on the Right Toe Great. The wound measures 0.1cm length x 0.1cm width x 0.1cm depth; 0.008cm^2 area and 0.001cm^3 volume. The wound is limited to skin breakdown. There is no tunneling or undermining noted. There is a none present amount of drainage noted. The wound margin is distinct with the outline attached to the wound base. There is no granulation within the wound bed. There is no necrotic tissue within the wound bed. Wound #21 status is Open. Original cause of wound was Gradually Appeared. The wound is located on the Right Toe Second. The wound measures 1.4cm length x 1.1cm width x 0.1cm depth; 1.21cm^2 area and 0.121cm^3 volume. There is bone and Fat Layer (Subcutaneous Tissue) Exposed exposed. There is no tunneling or undermining  noted. There is a none present amount of drainage noted. The wound margin is flat and intact. There is no granulation within the wound bed. There is a large (67-100%) amount of necrotic tissue within the wound bed including Eschar and Adherent Slough. Wound #22 status is Healed - Epithelialized. Original cause of wound was Gradually Appeared. The wound is located on the Right Toe Third. The wound measures 0cm length x 0cm width x 0cm depth; 0cm^2 area and 0cm^3 volume. The wound is limited to skin breakdown. There is no tunneling or undermining noted. There is a none present amount of drainage noted. The wound margin is flat and intact. There is no granulation within the wound bed. There is no necrotic tissue within the wound bed. Wound #24 status is Open. Original cause of wound was Gradually Appeared. The wound is located on the [facility] Lower Leg. The wound measures 4.2cm length x 2.1cm width x 0.1cm depth; 6.927cm^2 area and 0.693cm^3 volume. There is Fat Layer (Subcutaneous Tissue) Exposed exposed. There is no tunneling or undermining noted. There is a medium amount of serosanguineous drainage noted. The wound margin is flat and intact. There is large (67-100%) red, pink granulation within the wound bed. There is a small (1-33%) amount of necrotic tissue within the wound bed including Adherent Slough. Wound #25 status is Open. Original cause of wound was Gradually Appeared. The wound is located on the Left,Distal,Posterior Lower Leg. The wound measures 1.9cm length x 1.9cm width x 0.1cm depth; 2.835cm^2 area and 0.284cm^3 volume. There is Fat Layer (Subcutaneous Tissue) Exposed exposed. There is no tunneling or undermining noted. There is a medium amount of serosanguineous drainage noted. The wound margin is flat and intact. There is medium (34-66%) pink granulation within the wound bed. There is a medium (34-66%) amount of necrotic tissue within the wound bed including Eschar and  Adherent Slough. Wound #26 status is Healed - Epithelialized. Original cause of wound was Gradually Appeared. The wound is located on the Left,Medial Lower Leg. The wound measures 0cm length x 0cm width x 0cm depth; 0cm^2 area and 0cm^3  volume. There is no tunneling or undermining noted. There is a none present amount of drainage noted. The wound margin is flat and intact. There is no granulation within the wound bed. There is no necrotic tissue within the wound bed. Wound #28 status is Open. Original cause of wound was Gradually Appeared. The wound is located on the Left,Proximal,Posterior Lower Leg. The wound measures 1.4cm length x 0.7cm width x 0.1cm depth; 0.77cm^2 area and 0.077cm^3 volume. There is Fat Layer (Subcutaneous Tissue) Exposed exposed. There is no tunneling or undermining noted. There is a small amount of serosanguineous drainage noted. The wound margin is flat and intact. There is large (67-100%) pink granulation within the wound bed. There is no necrotic tissue within the wound bed. Wound #3R status is Open. Original cause of wound was Gradually Appeared. The wound is located on the Right,Posterior Calf. The wound measures 0.6cm length x 0.4cm width x 0.1cm depth; 0.188cm^2 area and 0.019cm^3 volume. There is Fat Layer (Subcutaneous Tissue) Exposed exposed. There is no tunneling or undermining noted. There is a small amount of serosanguineous drainage noted. The wound margin is distinct with the outline attached to the wound base. There is large (67-100%) pink granulation within the wound bed. There is no necrotic tissue within the wound bed. 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 There was a Haematologist Compression Therapy Procedure by [name], RN. Post procedure Diagnosis Wound #19: Same as Pre-Procedure Wound #24 Pre-procedure diagnosis of Wound #24 is a Venous Leg Ulcer located on the Left,Distal,Anterior Lower Leg . There was a Haematologist Compression Therapy Procedure by [name], RN. Post procedure Diagnosis Wound #24: Same as Pre-Procedure Wound #25 Pre-procedure diagnosis of Wound #25 is a Venous Leg Ulcer located on the Left,Distal,Posterior Lower Leg . There was a Haematologist Compression Therapy Procedure by [name], RN. Post procedure Diagnosis Wound #25: Same as Pre-Procedure Wound #28 Pre-procedure diagnosis of Wound #28 is a Diabetic Wound/Ulcer of the Lower Extremity located on the Left,Proximal,Posterior Lower Leg . There was a Haematologist Compression Therapy Procedure by [name], RN. Post procedure Diagnosis Wound #28: Same as Pre-Procedure Wound #3R Pre-procedure diagnosis of Wound #3R is a Diabetic Wound/Ulcer of the Lower Extremity located on the Right,Posterior Calf . There was a Haematologist Compression Therapy Procedure by [name], RN. Post procedure Diagnosis Wound #3R: Same as Pre-Procedure Plan Follow-up Appointments: Return Appointment in 1 week. - Tuesday **********EXTRA TIME**************** Dressing Change Frequency: Wound #13 Left Toe Second: Change dressing every day. Wound #19 Left,Anterior Lower Leg: Do not change entire dressing for one week. Wound #20 Right Toe Great: Change  dressing every day. Wound #21 Right Toe Second: Change dressing every day. Wound #24 Left,Distal,Anterior Lower Leg: Do not change entire dressing for one week. Wound #25 Left,Distal,Posterior Lower Leg: Do not change entire dressing for one week. Wound #3R  Right,Posterior Calf: Do not change entire dressing for one week. Skin Barriers/Peri-Wound Care: TCA Cream or Ointment Wound Cleansing: May shower with protection. Primary Wound Dressing: Wound #13 Left Toe Second: Calcium Alginate with Silver Wound #19 Left,Anterior Lower Leg: Silver Collagen Wound #20 Right Toe Great: Calcium Alginate with Silver Wound #21 Right Toe Second: Calcium Alginate with Silver Wound #24 Left,Distal,Anterior Lower Leg: Silver Collagen Wound #25 Left,Distal,Posterior Lower Leg: Silver Collagen Wound #3R Right,Posterior Calf: Silver Collagen Secondary Dressing: Dry Gauze - secure toes with tape Edema Control: Unna Boots Bilaterally Off-Loading: Open toe surgical shoe to: - left and right foot Consults ordered were: Podiatry - [facility] ref for possible amputations bilat 2nd toes 1. I change the primary dressings to the left lower leg all to silver collagen 2. Silver collagen on the right posterior calf 3. Silver alginate to his bilateral second toes 4. I continued with Keflex for another week. However in my view both of his second toes are not salvageable especially the ones on the left then I am referring him to [facility] for amputations. He has had previous arterial studies should not preclude this 5. Need to review his vascular status I know he had noninvasive studies and I remember them is being fairly reasonable but I need to review them Electronic Signature(s) Signed: 11/07/2019 5:08:08 PM By: [name] MD Entered By: [name] on 11/07/2019 11:47:11 -------------------------------------------------------------------------------- SuperBill  Details Patient Name: Date of Service: [name] 11/07/2019 Medical Record [id number] Patient Account Number: [id number] Date of Birth/Sex: Treating RN: Dec 06, 1940 (79 y.o. [name]) Dolores Lory, [place] Primary Care Provider: Hurshel Party Other Clinician: Referring Provider: Treating Provider/Extender:Rhyatt Muska, Cheryl Flash, Maguayo Weeks in Treatment: 36 Diagnosis Coding ICD-10 Codes Code Description E11.51 Type 2 diabetes mellitus with diabetic peripheral angiopathy without gangrene L97.211 Non-pressure chronic ulcer of right calf limited to breakdown of skin E11.42 Type 2 diabetes mellitus with diabetic polyneuropathy L97.221 Non-pressure chronic ulcer of left calf limited to breakdown of skin I87.333 Chronic venous hypertension (idiopathic) with ulcer and inflammation of bilateral lower extremity L97.524 Non-pressure chronic ulcer of other part of left foot with necrosis of bone L97.511 Non-pressure chronic ulcer of other part of right foot limited to breakdown of skin O35.00 Chronic systolic (congestive) heart failure X38.182 Other chronic osteomyelitis, left ankle and foot Facility Procedures The patient participates with Medicare or their insurance follows the Medicare Facility Guidelines: CPT4 Code Description Modifier Quantity [id number] [phone] - APPLY UNNA BOOT/PROFO BILATERAL 1 Physician Procedures CPT4 Code Description: [id number] - WC PHYS LEVEL 4 - EST PT ICD-10 Diagnosis Description M86.672 Other chronic osteomyelitis, left ankle and foot L97.221 Non-pressure chronic ulcer of left calf limited to brea L97.211 Non-pressure chronic ulcer of  right calf limited to bre L97.524 Non-pressure chronic ulcer of other part of left foot w Modifier: kdown of skin akdown of skin ith necrosis of Quantity: 1 bone Electronic Signature(s) Signed: 11/07/2019 5:08:08 PM By: [name] MD Entered By: [name] on 11/07/2019 11:45:34

## 2019-11-08 ENCOUNTER — Other Ambulatory Visit (HOSPITAL_COMMUNITY)
Admission: RE | Admit: 2019-11-08 | Discharge: 2019-11-08 | Disposition: A | Discharge status: Home / Self Care | Payer: Medicare HMO | Source: Ambulatory Visit | Attending: Physician Assistant | Admitting: Physician Assistant

## 2019-11-08 DIAGNOSIS — I4891 Unspecified atrial fibrillation: Secondary | ICD-10-CM | POA: Diagnosis not present

## 2019-11-08 LAB — BASIC METABOLIC PANEL
Anion gap: 9 (ref 5–15)
BUN: 39 mg/dL — ABNORMAL HIGH (ref 8–23)
CO2: 22 mmol/L (ref 22–32)
Calcium: 9.5 mg/dL (ref 8.9–10.3)
Chloride: 104 mmol/L (ref 98–111)
Creatinine, Ser: 1.13 mg/dL (ref 0.61–1.24)
GFR calc Af Amer: >60 mL/min (ref >60)
GFR calc non Af Amer: >60 mL/min (ref >60)
Glucose, Bld: 160 mg/dL — ABNORMAL HIGH (ref 70–99)
Potassium: 4.9 mmol/L (ref 3.5–5.1)
Sodium: 135 mmol/L (ref 135–145)

## 2019-11-14 ENCOUNTER — Encounter (HOSPITAL_BASED_OUTPATIENT_CLINIC_OR_DEPARTMENT_OTHER): Payer: Medicare HMO | Admitting: Internal Medicine

## 2019-11-14 ENCOUNTER — Other Ambulatory Visit: Payer: Self-pay

## 2019-11-14 DIAGNOSIS — E11622 Type 2 diabetes mellitus with other skin ulcer: Secondary | ICD-10-CM | POA: Diagnosis not present

## 2019-11-14 DIAGNOSIS — L97511 Non-pressure chronic ulcer of other part of right foot limited to breakdown of skin: Secondary | ICD-10-CM | POA: Diagnosis not present

## 2019-11-14 DIAGNOSIS — E11621 Type 2 diabetes mellitus with foot ulcer: Secondary | ICD-10-CM | POA: Diagnosis not present

## 2019-11-14 DIAGNOSIS — I110 Hypertensive heart disease with heart failure: Secondary | ICD-10-CM | POA: Diagnosis not present

## 2019-11-14 DIAGNOSIS — L97211 Non-pressure chronic ulcer of right calf limited to breakdown of skin: Secondary | ICD-10-CM | POA: Diagnosis not present

## 2019-11-14 DIAGNOSIS — L03116 Cellulitis of left lower limb: Secondary | ICD-10-CM | POA: Diagnosis not present

## 2019-11-14 DIAGNOSIS — I87333 Chronic venous hypertension (idiopathic) with ulcer and inflammation of bilateral lower extremity: Secondary | ICD-10-CM | POA: Diagnosis not present

## 2019-11-14 DIAGNOSIS — L97822 Non-pressure chronic ulcer of other part of left lower leg with fat layer exposed: Secondary | ICD-10-CM | POA: Diagnosis not present

## 2019-11-14 DIAGNOSIS — I87313 Chronic venous hypertension (idiopathic) with ulcer of bilateral lower extremity: Secondary | ICD-10-CM | POA: Diagnosis not present

## 2019-11-14 DIAGNOSIS — L97221 Non-pressure chronic ulcer of left calf limited to breakdown of skin: Secondary | ICD-10-CM | POA: Diagnosis not present

## 2019-11-14 DIAGNOSIS — L97524 Non-pressure chronic ulcer of other part of left foot with necrosis of bone: Secondary | ICD-10-CM | POA: Diagnosis not present

## 2019-11-14 DIAGNOSIS — I890 Lymphedema, not elsewhere classified: Secondary | ICD-10-CM | POA: Diagnosis not present

## 2019-11-14 DIAGNOSIS — L97222 Non-pressure chronic ulcer of left calf with fat layer exposed: Secondary | ICD-10-CM | POA: Diagnosis not present

## 2019-11-15 ENCOUNTER — Ambulatory Visit (INDEPENDENT_AMBULATORY_CARE_PROVIDER_SITE_OTHER): Payer: Medicare HMO | Admitting: *Deleted

## 2019-11-15 ENCOUNTER — Ambulatory Visit (INDEPENDENT_AMBULATORY_CARE_PROVIDER_SITE_OTHER): Payer: Medicare HMO | Admitting: Student

## 2019-11-15 ENCOUNTER — Encounter: Payer: Self-pay | Admitting: Student

## 2019-11-15 VITALS — BP 112/70 | HR 70 | Temp 98.2°F | Ht 74.5 in | Wt 201.2 lb

## 2019-11-15 DIAGNOSIS — I4821 Permanent atrial fibrillation: Secondary | ICD-10-CM

## 2019-11-15 DIAGNOSIS — I5022 Chronic systolic (congestive) heart failure: Secondary | ICD-10-CM | POA: Diagnosis not present

## 2019-11-15 DIAGNOSIS — Z5181 Encounter for therapeutic drug level monitoring: Secondary | ICD-10-CM | POA: Diagnosis not present

## 2019-11-15 DIAGNOSIS — I2510 Atherosclerotic heart disease of native coronary artery without angina pectoris: Secondary | ICD-10-CM

## 2019-11-15 DIAGNOSIS — I428 Other cardiomyopathies: Secondary | ICD-10-CM | POA: Diagnosis not present

## 2019-11-15 DIAGNOSIS — I10 Essential (primary) hypertension: Secondary | ICD-10-CM | POA: Diagnosis not present

## 2019-11-15 LAB — POCT INR: INR: 3 (ref 2.0–3.0)

## 2019-11-15 MED ORDER — SPIRONOLACTONE 25 MG PO TABS: 25.0000 mg | ORAL_TABLET | Freq: Every day | ORAL | 3 refills | Status: DC

## 2019-11-15 MED ORDER — SACUBITRIL-VALSARTAN 24-26 MG PO TABS: 1.0000 | ORAL_TABLET | Freq: Two times a day (BID) | ORAL | 11 refills | Status: DC

## 2019-11-15 NOTE — Patient Instructions (Signed)
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 Recheck in 3 weeks

## 2019-11-15 NOTE — Patient Instructions (Signed)
Medication Instructions:  Your physician recommends that you continue on your current medications as directed. Please refer to the Current Medication list given to you today.  *If you need a refill on your cardiac medications before your next appointment, please call your pharmacy*  Lab Work: NONE   If you have labs (blood work) drawn today and your tests are completely normal, you will receive your results only by: Marland Kitchen MyChart Message (if you have MyChart) OR . A paper copy in the mail If you have any lab test that is abnormal or we need to change your treatment, we will call you to review the results.  Testing/Procedures: Your physician has requested that you have an echocardiogram. Echocardiography is a painless test that uses sound waves to create images of your heart. It provides your doctor with information about the size and shape of your heart and how well your heart's chambers and valves are working. This procedure takes approximately one hour. There are no restrictions for this procedure.    Follow-Up: At [facility], you and your health needs are our priority.  As part of our continuing mission to provide you with exceptional heart care, we have created designated Provider Care Teams.  These Care Teams include your primary Cardiologist (physician) and Advanced Practice Providers (APPs -  Physician Assistants and Nurse Practitioners) who all work together to provide you with the care you need, when you need it.  Your next appointment:   2 month(s)  The format for your next appointment:   In Person  Provider:   Kate Sable, MD  Other Instructions Thank you for choosing [facility]!

## 2019-11-15 NOTE — Progress Notes (Signed)
Daniel, Gross ([id number]) Visit Report for 11/14/2019 HPI Details Patient Name: Date of Service: Daniel, Gross 11/14/2019 8:00 AM Medical Record [place] Patient Account Number: [id number] Date of Birth/Sex: Treating RN: 11-25-1940 (79 y.o. [name]) [name] Primary Care Provider: Hurshel Party Other Clinician: Referring Provider: Treating Provider/Extender:Jenayah Antu, Cheryl Flash, [place] Weeks in Treatment: 37 History of Present Illness Location: Patient presents with a wound to left lower leg. Quality: Patient reports No Pain. Duration: 2 months HPI Description: no cig or alcohol. spontaneous appearance in area of stasis dermamtitis. [name] on metformin only. chronic afib on Coumadin. diabetes and coag studies not good. hba1c 7.5. ivr 4.5. no pain or sxs of systemic disease. hx chf. no intermittent claudication 02/28/2019 Readmission This is a now a 79 year old man who was previously cared for in 2016 by Dr. Lindon Romp for wounds on his lower extremities. At that point he had venous reflux studies although I cannot seem to open these in [place] link. He had arterial studies showing an ABI of 1.11 on the right and 1.27 on the left his waveforms were triphasic bilaterally. He was discharged in stockings although I do not believe he is wearing these in some time. He tells me that about a month ago he noted openings of a large wound on the posterior right calf and 2 smaller areas on the left lateral calf and a small area more recently on the left posterior calf. He has been dressing these with peroxide and triple antibiotic ointment. He is not wearing compression. Past medical history; type 2 diabetes with peripheral neuropathy, chronic venous insufficiency, hypertension, cardiomyopathy, chronic atrial fibrillation on Coumadin, prostate cancer, hyperlipidemia, gout, ABI in our clinic was 1.34 on the left and not obtainable on the right 6/9; this is a patient who has chronic  venous insufficiency. He has a fairly substantial area on the right posterior calf, left lateral calf and a small area on the left posterior calf. On arrival last week he had very palpable popliteal and femoral pulses but nothing in his bilateral feet. Unfortunately we cannot get arterial studies until July 1 at Dr. Kennon Holter office. They live in [place]. We use silver alginate under Kerlix Coban 6/16; patient with chronic venous insufficiency with wounds on his bilateral lower extremities. When he came into our clinic he was discovered to have a complete absence of peripheral pedal pulses at either the dorsalis pedis or posterior tibial. He does have easily palpable femoral and popliteal pulses. He sees Dr. [name] tomorrow for noninvasive arterial tests. He may also require venous reflux evaluation although I do not view this as an urgent thing. We have been using silver alginate. His wound surfaces of cleaned up quite nicely 6/23; patient with chronic venous insufficiency with wounds on his bilateral lower extremities. His wounds all are somewhat better looking. He did go to Dr. Kennon Holter office but somehow ended up on the doctors schedule rather than being scheduled for noninvasive tests therefore his noninvasive tests are scheduled for July 1. We agree that he has venous insufficiency ulcers but I cannot feel any pulses in his lower extremities dictating the need for test. We are only using Kerlix and light Coban unfortunately this appears to be holding the edema 6/30; has his arterial studies tomorrow. We have been using Kerlix and light Coban will go to a more aggressive compression if the arterial studies will allow. We all agreed these are venous wounds however I cannot feel pulses at either the dorsalis pedis or posterior tibial  bilaterally. His wounds generally look some better including left lateral and right posterior. 7/7-Patient returns at 1 week in Kerlix/Coban to both legs, with  improvement, in the left lateral and right posterior lower leg wounds, ABI's are normal in both legs per vascular studies, TBI is also normal on both sides, we are using hydrofera blue to the wounds 7/14; patient's arterial studies from 2 weeks ago showed an ABI on the right at 1.03 with a TBI of 0.86. On the left the ABI was 1.06 with a TBI of 0.84. Notable for the fact that his arterial waveforms were monophasic in all of the lower extremity arteries suggesting some degree of arterial occlusive disease but in general this was felt to be fairly adequate for healing. His compression was increased from 2-3 layer which is appropriate. Dressing was changed to [facility] 7/21; patient's wounds are measuring smaller. The more substantial one on the right posterior calf, second 1 on the left lateral calf. Using [facility] on both wound areas 7/28; patient continues to make nice improvements. The area on the right posterior calf is smaller. Area on the left lateral calf also is smaller. We have been using Hydrofera Blue under compression. The patient will need compression stockings and we have measured him for these in the eventuality that these heal which really should not be too long from now 8/4-Patient continues to make improvement, the right posterior calf area smaller with rim of keratotic skin on one side, the left wound is definitely smaller and improving. 8/11-Returns at 1 week, after being in 3 layer compression on both legs, both wounds appear to be improving, making good progress, patient is happy, pain is also less especially in the right leg wound 8/18; the area on the left anterior lower leg is healed. On the right posterior leg the wound remains although the dimensions are a lot better. 8/25; he arrives in clinic today with a large body of open wound on the left lateral calf. All of the 3 wounds in this area are in close juxtaposition to each other. The story is that we  discharged him last week with no a wrap on the left leg. They went to [name] could not get in as they are only excepting phone orders or online orders for stockings hence they did not put any stocking on the left leg all week. They have something at home but the patient with that was either incapable or just did not put them on. Apparently these opened 1 morning after getting out of bed. The area on the right has no real change 9/1; patient has bilateral lower extremity wounds in the setting of severe chronic venous insufficiency and secondary lymphedema. He arrived last week with new areas on the left lateral lower leg after we did not wrap him and he did not use his stockings. Nevertheless the areas on the left look better today under compression. Posterior right calf does not really changed. We are using Hydrofera Blue on both areas under compression 9/15; bilateral lower extremity wounds in the setting of severe chronic venous insufficiency and secondary lymphedema. He has 20 to 30 mmHg below-knee compression stockings under the eventuality that these close over. We did get the left leg to close but he did not transition to a stocking and this reopened. There are 2 open areas on the left posterior lateral calf and one on the right. Both of these look satisfactory. Using [facility] 9/22; bilateral lower extremity wounds in the  setting of chronic venous insufficiency. 2 superficial areas on the left lateral calf. One on the right just above the Achilles area. We have good edema control we have been using Hydrofera Blue 9/29; the areas on the left lateral calf are healed. On the right just above the Achilles and tendon area things look a lot better small wounds one scabbed area. We have been using Hydrofera Blue. We can discharge him in his own stocking on the left still wrapping on the right. This is the second time we have healed the left leg but he did not put a stocking on last time.  Hopefully this will maintain the edema from chronic venous disease with secondary lymphedema 10/6; he comes in today having a stocking on the left leg. They had trouble getting it on there is a lot of increase in swelling 2 small open areas one anteriorly and one on the medial calf. They report a lot of difficulty getting the stocking on. Paradoxically the area on the right that we have been wrapping posteriorly is closed 10/13; he comes in today with wounds bilaterally including superficial areas on the left medial and left lateral calf. As well as the right posterior has reopened in the Achilles area superiorly. He still does not have his juxta lite stockings although truthfully we would not of been able to use them today anyway. Apparently have been ordered and paid for from [employer] although they have not been delivered 10/20; his area on the right is just the boat closed on the right posterior. Still has the area on the left lateral and a very tiny area on the left medial. He has his bilateral juxta lites although he is not ready for them this week. He tolerated the increase to 4 layer compression last week quite well 10/27; the area on the right posterior calf is once again closed. He has a superficial area on the left lateral calf that is still open. He has been using Hydrofera Blue and bilateral 4-layer compression. He can change to his own juxta lite stocking on the right and we are instructing him today 11/3; the area on the right posterior calf reopened according to the patient and his wife after they took off the stocking when they got home last week. Apparently scabbed over there is now a fairly substantial wound which looks pretty much the same. Our intake nurse noted that they were using the juxta lite stockings appropriately. I was really hoping I might be able to close him out today. He has 1 very tiny remaining area on the left lateral lower leg. 11/10; right posterior calf wound  measures smaller but is still open. We have been using Hydrofera Blue. On the left he has a small oval-shaped wound and he seems to have had another wound distally that is open and likely a blister. We are using Hydrofera Blue under compression 11/17; right posterior calf wound continues to get better. We have been using Hydrofera Blue. On the left lateral one of the wounds has closed still a small open area. We have been using Hydrofera Blue on this as well. Both areas have been under 4-layer compression Arrives in clinic today with some swelling in the dorsal foot on the right some erythema of his forefoot and toes. Initially when I looked at this I almost thought this was a sunburn distal to a wrap injury. 12/1; right posterior calf wound debrided with a curette. We have been using Hydrofera Blue on the  left anterior lateral he has an area across the mid tibia. Finally a small area on the left lateral lower calf. Finally he continues to have de-epithelialized areas on the dorsal aspect of his toes. Initially thought this might be a burn injury when I saw him 2 weeks ago. I now wonder about tinea. I have also reviewed his arterial studies which were really quite good in July/20 with normal TBI's and ABIs but monophasic waveforms 12/8; comes in today with worsening problems especially on the left leg where he now has a cluster of wounds in the left anterior mid tibia. Very poor edema control. I reduced him to 3 layer from 4 layer compression last week because of some concern about blood flow to his toes however he does not have good edema control on the left leg. Right leg edema control looks satisfactory. On the left he has a cluster of wounds anteriorly, small area on the left medial fifth met head and then the collection of areas on his toes which appear better On the right he has the original area on the right posterior calf, a new area right medially. His formal arterial studies from mid  July are noted below. He was evaluated by Dr.Berry ABI Findings: +---------+------------------+-----+----------+--------+ Right Rt Pressure (mmHg)IndexWaveform Comment  +---------+------------------+-----+----------+--------+ Brachial 176     +---------+------------------+-----+----------+--------+ ATA 176 0.99 monophasic  +---------+------------------+-----+----------+--------+ PTA 183 1.03 monophasic  +---------+------------------+-----+----------+--------+ PERO 172 0.97 monophasic  +---------+------------------+-----+----------+--------+ Great Toe153 0.86    +---------+------------------+-----+----------+--------+ +---------+------------------+-----+-----------+-------+ Left Lt Pressure (mmHg)IndexWaveform Comment +---------+------------------+-----+-----------+-------+ Brachial 178     +---------+------------------+-----+-----------+-------+ ATA 162 0.91 multiphasic  +---------+------------------+-----+-----------+-------+ PTA 188 1.06 multiphasic  +---------+------------------+-----+-----------+-------+ PERO 158 0.89 monophasic   +---------+------------------+-----+-----------+-------+ Great Toe150 0.84    +---------+------------------+-----+-----------+-------+ +-------+-----------+-----------+------------+------------+ ABI/TBIToday's ABIToday's TBIPrevious ABIPrevious TBI +-------+-----------+-----------+------------+------------+ Right 1.03 0.86 1.11   +-------+-----------+-----------+------------+------------+ Left 1.06 0.84 1.27   +-------+-----------+-----------+------------+------------+ Tibial waveforms somewhat difficult to record due to irregular heartbeat. Bilateral ABIs appear essentially unchanged compared to prior study on 06/21/15. Summary: Right: Resting right ankle-brachial index is within normal range. No evidence of significant right lower extremity arterial disease.  The right toe-brachial index is normal. Although ankle brachial indices are within normal limits (0.95-1.29), arterial Doppler waveforms at the ankle suggest some component of arterial occlusive disease. Left: Resting left ankle-brachial index is within normal range. No evidence of significant left lower extremity arterial disease. The left toe-brachial index is normal. Although ankle brachial indices are within normal limits (0.95-1.29), arterial Doppler waveforms at the ankle suggest some component of arterial occlusive disease. 12/15; the patient's area on the left mid tibia looks better. Right posterior calf also better. He has the area on the left foot as well. All of his toes look better I think this was tinea. We are using Hydrofera Blue everywhere else The patient was in urgent care yesterday with wheezing and shortness of breath. He got azithromycin and prednisone. He feels better. His lungs are currently clear to auscultation. He was not tested for Covid 19 12/29; the patient arrives in clinic today with quite a bit change. 2 weeks ago he only had areas on the left mid tibia right posterior calf with tinea pedis resolving between his toes. He arrives in clinic today with several areas on the dorsal toes on the right, dorsal left second toe. He has skin breakdown in the left medial calf probably from excess edema. Small area proximally in the medial calf. He has weeping edema fluid coming out of the skin excoriations on the left medial calf. He tells  me that he is having a cardiac catheterization next week. I had a quick look at [facility] health link. He was found to have an ejection fraction of 25% during the work-up for persistent atrial fibrillation. He saw his cardiology office yesterday seen by the nurse practitioner. She increased his carvedilol. He has not been on diuretics for apparently several months and indeed in the nurse practitioner Dietrich Pates notes she had knowledge of  this. 10/04/2019 on evaluation today patient presents as a walk-in visit concerning the fact that he did not have an appointment here for our clinic at this point. He actually had a cardiac catheterization earlier today and then came from there to here in order to be evaluated. With that being said unfortunately he is having significant cellulitis of his left lower extremity upon evaluation today this appears to have deteriorated even since last week's evaluation with Dr. Dellia Nims. The right lower extremity is actually doing okay I really see no evidence of deterioration at this point at those locations. In fact the right leg seems in general be doing quite well. Nonetheless I am concerned about infection and cellulitis of the left lower extremity and again considering his weakened heart I do not want him to develop into sepsis at all. He is also having some trouble breathing today and I understand according to nursing staff this is always the case to some degree. With that being said the patient unfortunately seems to be in my opinion a little bit worse even his wife feels like that may be the case today. Unsure exactly what is leading to this. Cardiac catheterization I did review the report which showed an ejection fraction of 25% he also had an LAD blockage of around 25% based on what I saw. With that being said there was no significant blockages that required stenting at this point he does have weakened cardiac muscles compared to normal. 10/05/2019; patient was seen yesterday in clinic. He was sent to the ER because of cellulitis of the left leg possibility. In the ER he was given 1 dose of IV Levaquin and discharged on Keflex. He came in the clinic initially for a nurse visit to rewrap his left leg. We did not look at the right leg today that is an Haematologist. The patient also had a cardiac cath. According to him there were no blockages but a very low ejection fraction. 1/12; back for an early  follow-up. The condition of the left lower leg is a lot better although there are multiple open areas. All of them with not a particularly viable surface. On the right posterior calf he has a single wound with a clean surface. He has a wound with exposed bone on the PIP of the left second toe dorsally. He has wounds on the dorsal right first second and third toes. His arterial studies were normal. 1/19; the patient has 3 open wounds on the left leg anteriorly in the mid tibia, distally and medially just above the ankle and posteriorly. On the right he has a small area dime sized on the right posterior calf. His edema control is a lot better. He still has wounds on the right second and left second toes. The left second toe has exposed bone. X- ray I did last week did not show evidence of osteomyelitis in the left foot. 1/26; patient with a multiplicity of wounds and problems. On the left he has a circular area on the left anterior mid tibia Left just above  the medical ankle -left 2nd toe pip -right 2nd toe right posterior calf 2/2; patient with a multiplicity of wounds and problems. He has severe chronic venous insufficiency and the wounds on his leg are all on the left left anterior left medial at the medial malleolus and left posterior. We have been using Iodoflex to these areas to help with ongoing debridement. He also has largely traumatic wounds on his toes this includes the left second with exposed bone. Bone culture I did last week showed Staph aureus which is methicillin sensitive. I discussed with him today the idea of an amputation of this toe because it is literally nonfunctional however he wants to try antibiotics. Antibiotic choice is complicated by the fact that he is on Coumadin. He also has wounds over the dorsal part of the right first which is close to closed. Right second toe has exposed bone and the right third toe at the base of the right third toe is just about closed as  well. We have been using silver alginate 2/9; patient with severe chronic venous insufficiency. He has large wounds on the left anterior mid tibia and on the left medial just above the ankle. I am concerned today about the depth of the area on the left mid tibia may be exposing into the muscle. He has small areas on the left posterior calf and on the right posterior calf although these do not look as threatening. He also has traumatic wounds on his toes. The right first and third are closed however the bilateral second toes have exposed bone. The area on the left is open into the distal interphalangeal joint. In my opinion these toes are nonsalvageable and will need amputation 2/16; patient with severe chronic venous insufficiency. He has wounds on the left anterior mid tibia left medial lower leg just above the ankle. He has small areas on the left posterior calf and a more prominent area on the right posterior calf. All of these are related to poorly controlled venous hypertension. He also had traumatic wounds on the PIPs of both second toes. Unfortunately both of these have exposed bone and in the case of the left this I think goes right into the joint itself. I do not think either 1 of these is salvageable. I have reviewed his arterial evaluation that was done in July last year. He also saw Dr. [name] in June. He felt these were venous stasis. Previously had segmental arterial pressures performed in 11/03/2014 which were normal. He was sent for for a follow-up arterial evaluation on July 1. On the right his ABIs were 1.03 with a TBI of 0.86 although his waveforms were monophasic on the left he had multiphasic waveforms at the ATA PTA but monophasic at the peroneal nevertheless his ABI was normal at 1.06 TBI also normal at 0.84. I think he has enough blood flow to heal an amputation which I think he needs of the left second toe and probably the right second toe as well Electronic  Signature(s) Signed: 11/14/2019 5:18:57 PM By: [name] MD Entered By: [name] on 11/14/2019 09:23:14 -------------------------------------------------------------------------------- Physical Exam Details Patient Name: Date of Service: Daniel Pia D. 11/14/2019 8:00 AM Medical Record [id number] Patient Account Number: [id number] Date of Birth/Sex: Treating RN: September 30, 1940 (79 y.o. [name] Primary Care Provider: Hurshel Party Other Clinician: Referring Provider: Treating Provider/Extender:Nazli Penn, Cheryl Flash, [place] Weeks in Treatment: 37 Cardiovascular Dorsalis pedis pulses are palpable although his feet are cold. Notes Wound exam The left anterior  leg wounds are debrided with Anasept and gauze. This includes the anterior tibial and medial calf just above the ankle. Posteriorly a small wound on the left and a more prominent wound on the right but both of these appear healthy. The areas on the left second toe showed exposed bone and probably expansion into the joint itself over the PIP. He also has exposed bone on the right. Electronic Signature(s) Signed: 11/14/2019 5:18:57 PM By: [name] MD Entered By: [name] on 11/14/2019 09:24:14 -------------------------------------------------------------------------------- Physician Orders Details Patient Name: Date of Service: Daniel Pia D. 11/14/2019 8:00 AM Medical Record [id number] Patient Account Number: [id number] Date of Birth/Sex: Treating RN: June 22, 1941 (79 y.o. [name]) [name] Primary Care Provider: Hurshel Party Other Clinician: Referring Provider: Treating Provider/Extender:Shirle Provencal, Cheryl Flash, [place] Weeks in Treatment: 71 Verbal / Phone Orders: No Diagnosis Coding ICD-10 Coding Code Description E11.51 Type 2 diabetes mellitus with diabetic peripheral angiopathy without gangrene L97.211 Non-pressure chronic ulcer of right calf limited to breakdown of  skin E11.42 Type 2 diabetes mellitus with diabetic polyneuropathy L97.221 Non-pressure chronic ulcer of left calf limited to breakdown of skin I87.333 Chronic venous hypertension (idiopathic) with ulcer and inflammation of bilateral lower extremity L97.524 Non-pressure chronic ulcer of other part of left foot with necrosis of bone L97.511 Non-pressure chronic ulcer of other part of right foot limited to breakdown of skin G25.42 Chronic systolic (congestive) heart failure H06.237 Other chronic osteomyelitis, left ankle and foot Follow-up Appointments Return Appointment in 1 week. - Tuesday **********EXTRA TIME**************** Dressing Change Frequency Wound #13 Left Toe Second Change dressing every day. Wound #19 Left,Anterior Lower Leg Do not change entire dressing for one week. Wound #20 Right Toe Great Change dressing every day. Wound #21 Right Toe Second Change dressing every day. Wound #24 Left,Distal,Anterior Lower Leg Do not change entire dressing for one week. Wound #25 Left,Distal,Posterior Lower Leg Do not change entire dressing for one week. Wound #3R Right,Posterior Calf Do not change entire dressing for one week. Skin Barriers/Peri-Wound Care TCA Cream or Ointment Wound Cleansing May shower with protection. Primary Wound Dressing Wound #13 Left Toe Second Calcium Alginate with Silver Wound #19 Left,Anterior Lower Leg Silver Collagen Wound #20 Right Toe Great Calcium Alginate with Silver Wound #21 Right Toe Second Calcium Alginate with Silver Wound #24 Left,Distal,Anterior Lower Leg Silver Collagen Wound #25 Left,Distal,Posterior Lower Leg Silver Collagen Wound #3R Right,Posterior Calf Silver Collagen Secondary Dressing Dry Gauze - secure toes with tape Edema Control Unna Boots Bilaterally Off-Loading Open toe surgical shoe to: - left and right foot Electronic Signature(s) Signed: 11/14/2019 5:18:57 PM By: [name] MD Signed: 11/15/2019 5:45:19 PM  By: [name] RN Entered By: [name] on 11/14/2019 08:04:01 -------------------------------------------------------------------------------- Problem List Details Patient Name: Date of Service: Daniel Pia D. 11/14/2019 8:00 AM Medical Record [id number] Patient Account Number: [id number] Date of Birth/Sex: Treating RN: [birth year] (79 y.o. [name]) Dolores Lory, [place] Primary Care Provider: Hurshel Party Other Clinician: Referring Provider: Treating Provider/Extender:Shamiracle Gorden, Cheryl Flash, [place] Weeks in Treatment: 37 Active Problems ICD-10 Evaluated Encounter Code Description Active Date Code Description Active Date Today Diagnosis L97.211 Non-pressure chronic ulcer of right calf limited to 02/28/2019 No Yes breakdown of skin L97.221 Non-pressure chronic ulcer of left calf limited to 02/28/2019 No Yes breakdown of skin I87.333 Chronic venous hypertension (idiopathic) with ulcer 02/28/2019 No Yes and inflammation of bilateral lower extremity E11.51 Type 2 diabetes mellitus with diabetic peripheral 02/28/2019 No Yes angiopathy without gangrene E11.42 Type 2 diabetes mellitus with diabetic polyneuropathy 02/28/2019 No Yes L97.524 Non-pressure  chronic ulcer of other part of left foot 10/10/2019 No Yes with necrosis of bone L97.511 Non-pressure chronic ulcer of other part of right foot 09/26/2019 No Yes limited to breakdown of skin I95.18 Chronic systolic (congestive) heart failure 10/05/2019 No Yes M86.672 Other chronic osteomyelitis, left ankle and foot 10/31/2019 No Yes Inactive Problems ICD-10 Code Description Active Date Inactive Date B35.3 Tinea pedis 09/05/2019 09/05/2019 L97.521 Non-pressure chronic ulcer of other part of left foot limited to 09/26/2019 09/26/2019 breakdown of skin Resolved Problems Electronic Signature(s) Signed: 11/14/2019 5:18:57 PM By: [name] MD Entered By: [name] on 11/14/2019  09:16:58 -------------------------------------------------------------------------------- Progress Note Details Patient Name: Date of Service: Daniel Pia D. 11/14/2019 8:00 AM Medical Record [id number] Patient Account Number: [id number] Date of Birth/Sex: Treating RN: June 18, 1941 (79 y.o. [name]) [name] Primary Care Provider: Hurshel Party Other Clinician: Referring Provider: Treating Provider/Extender:Khandi Kernes, Cheryl Flash, [place] Weeks in Treatment: 37 Subjective History of Present Illness (HPI) The following HPI elements were documented for the patient's wound: Location: Patient presents with a wound to left lower leg. Quality: Patient reports No Pain. Duration: 2 months no cig or alcohol. spontaneous appearance in area of stasis dermamtitis. [name] on metformin only. chronic afib on Coumadin. diabetes and coag studies not good. hba1c 7.5. ivr 4.5. no pain or sxs of systemic disease. hx chf. no intermittent claudication 02/28/2019 Readmission This is a now a 79 year old man who was previously cared for in 2016 by Dr. Lindon Romp for wounds on his lower extremities. At that point he had venous reflux studies although I cannot seem to open these in [facility] link. He had arterial studies showing an ABI of 1.11 on the right and 1.27 on the left his waveforms were triphasic bilaterally. He was discharged in stockings although I do not believe he is wearing these in some time. He tells me that about a month ago he noted openings of a large wound on the posterior right calf and 2 smaller areas on the left lateral calf and a small area more recently on the left posterior calf. He has been dressing these with peroxide and triple antibiotic ointment. He is not wearing compression. Past medical history; type 2 diabetes with peripheral neuropathy, chronic venous insufficiency, hypertension, cardiomyopathy, chronic atrial fibrillation on Coumadin, prostate cancer, hyperlipidemia,  gout, ABI in our clinic was 1.34 on the left and not obtainable on the right 6/9; this is a patient who has chronic venous insufficiency. He has a fairly substantial area on the right posterior calf, left lateral calf and a small area on the left posterior calf. On arrival last week he had very palpable popliteal and femoral pulses but nothing in his bilateral feet. Unfortunately we cannot get arterial studies until July 1 at Dr. Kennon Holter office. They live in [place]. We use silver alginate under Kerlix Coban 6/16; patient with chronic venous insufficiency with wounds on his bilateral lower extremities. When he came into our clinic he was discovered to have a complete absence of peripheral pedal pulses at either the dorsalis pedis or posterior tibial. He does have easily palpable femoral and popliteal pulses. He sees Dr. [name] tomorrow for noninvasive arterial tests. He may also require venous reflux evaluation although I do not view this as an urgent thing. We have been using silver alginate. His wound surfaces of cleaned up quite nicely 6/23; patient with chronic venous insufficiency with wounds on his bilateral lower extremities. His wounds all are somewhat better looking. He did go to Dr. Kennon Holter office but somehow  ended up on the doctors schedule rather than being scheduled for noninvasive tests therefore his noninvasive tests are scheduled for July 1. We agree that he has venous insufficiency ulcers but I cannot feel any pulses in his lower extremities dictating the need for test. We are only using Kerlix and light Coban unfortunately this appears to be holding the edema 6/30; has his arterial studies tomorrow. We have been using Kerlix and light Coban will go to a more aggressive compression if the arterial studies will allow. We all agreed these are venous wounds however I cannot feel pulses at either the dorsalis pedis or posterior tibial bilaterally. His wounds generally look some  better including left lateral and right posterior. 7/7-Patient returns at 1 week in Kerlix/Coban to both legs, with improvement, in the left lateral and right posterior lower leg wounds, ABI's are normal in both legs per vascular studies, TBI is also normal on both sides, we are using hydrofera blue to the wounds 7/14; patient's arterial studies from 2 weeks ago showed an ABI on the right at 1.03 with a TBI of 0.86. On the left the ABI was 1.06 with a TBI of 0.84. Notable for the fact that his arterial waveforms were monophasic in all of the lower extremity arteries suggesting some degree of arterial occlusive disease but in general this was felt to be fairly adequate for healing. His compression was increased from 2-3 layer which is appropriate. Dressing was changed to [facility] 7/21; patient's wounds are measuring smaller. The more substantial one on the right posterior calf, second 1 on the left lateral calf. Using [facility] on both wound areas 7/28; patient continues to make nice improvements. The area on the right posterior calf is smaller. Area on the left lateral calf also is smaller. We have been using Hydrofera Blue under compression. The patient will need compression stockings and we have measured him for these in the eventuality that these heal which really should not be too long from now 8/4-Patient continues to make improvement, the right posterior calf area smaller with rim of keratotic skin on one side, the left wound is definitely smaller and improving. 8/11-Returns at 1 week, after being in 3 layer compression on both legs, both wounds appear to be improving, making good progress, patient is happy, pain is also less especially in the right leg wound 8/18; the area on the left anterior lower leg is healed. On the right posterior leg the wound remains although the dimensions are a lot better. 8/25; he arrives in clinic today with a large body of open wound on the left  lateral calf. All of the 3 wounds in this area are in close juxtaposition to each other. The story is that we discharged him last week with no a wrap on the left leg. They went to [facility] could not get in as they are only excepting phone orders or online orders for stockings hence they did not put any stocking on the left leg all week. They have something at home but the patient with that was either incapable or just did not put them on. Apparently these opened 1 morning after getting out of bed. The area on the right has no real change 9/1; patient has bilateral lower extremity wounds in the setting of severe chronic venous insufficiency and secondary lymphedema. He arrived last week with new areas on the left lateral lower leg after we did not wrap him and he did not use his stockings. Nevertheless the  areas on the left look better today under compression. Posterior right calf does not really changed. We are using Hydrofera Blue on both areas under compression 9/15; bilateral lower extremity wounds in the setting of severe chronic venous insufficiency and secondary lymphedema. He has 20 to 30 mmHg below-knee compression stockings under the eventuality that these close over. We did get the left leg to close but he did not transition to a stocking and this reopened. There are 2 open areas on the left posterior lateral calf and one on the right. Both of these look satisfactory. Using [facility] 9/22; bilateral lower extremity wounds in the setting of chronic venous insufficiency. 2 superficial areas on the left lateral calf. One on the right just above the Achilles area. We have good edema control we have been using Hydrofera Blue 9/29; the areas on the left lateral calf are healed. On the right just above the Achilles and tendon area things look a lot better small wounds one scabbed area. We have been using Hydrofera Blue. We can discharge him in his own stocking on the left still wrapping on  the right. This is the second time we have healed the left leg but he did not put a stocking on last time. Hopefully this will maintain the edema from chronic venous disease with secondary lymphedema 10/6; he comes in today having a stocking on the left leg. They had trouble getting it on there is a lot of increase in swelling 2 small open areas one anteriorly and one on the medial calf. They report a lot of difficulty getting the stocking on. ooParadoxically the area on the right that we have been wrapping posteriorly is closed 10/13; he comes in today with wounds bilaterally including superficial areas on the left medial and left lateral calf. As well as the right posterior has reopened in the Achilles area superiorly. He still does not have his juxta lite stockings although truthfully we would not of been able to use them today anyway. Apparently have been ordered and paid for from [employer] although they have not been delivered 10/20; his area on the right is just the boat closed on the right posterior. Still has the area on the left lateral and a very tiny area on the left medial. He has his bilateral juxta lites although he is not ready for them this week. He tolerated the increase to 4 layer compression last week quite well 10/27; the area on the right posterior calf is once again closed. He has a superficial area on the left lateral calf that is still open. He has been using Hydrofera Blue and bilateral 4-layer compression. He can change to his own juxta lite stocking on the right and we are instructing him today 11/3; the area on the right posterior calf reopened according to the patient and his wife after they took off the stocking when they got home last week. Apparently scabbed over there is now a fairly substantial wound which looks pretty much the same. Our intake nurse noted that they were using the juxta lite stockings appropriately. I was really hoping I might be able to close him  out today. He has 1 very tiny remaining area on the left lateral lower leg. 11/10; right posterior calf wound measures smaller but is still open. We have been using Hydrofera Blue. On the left he has a small oval-shaped wound and he seems to have had another wound distally that is open and likely a blister. We are  using Hydrofera Blue under compression 11/17; right posterior calf wound continues to get better. We have been using Hydrofera Blue. On the left lateral one of the wounds has closed still a small open area. We have been using Hydrofera Blue on this as well. Both areas have been under 4-layer compression Arrives in clinic today with some swelling in the dorsal foot on the right some erythema of his forefoot and toes. Initially when I looked at this I almost thought this was a sunburn distal to a wrap injury. 12/1; right posterior calf wound debrided with a curette. We have been using Hydrofera Blue on the left anterior lateral he has an area across the mid tibia. Finally a small area on the left lateral lower calf. Finally he continues to have de-epithelialized areas on the dorsal aspect of his toes. Initially thought this might be a burn injury when I saw him 2 weeks ago. I now wonder about tinea. I have also reviewed his arterial studies which were really quite good in July/20 with normal TBI's and ABIs but monophasic waveforms 12/8; comes in today with worsening problems especially on the left leg where he now has a cluster of wounds in the left anterior mid tibia. Very poor edema control. I reduced him to 3 layer from 4 layer compression last week because of some concern about blood flow to his toes however he does not have good edema control on the left leg. Right leg edema control looks satisfactory. ooOn the left he has a cluster of wounds anteriorly, small area on the left medial fifth met head and then the collection of areas on his toes which appear better ooOn the right he  has the original area on the right posterior calf, a new area right medially. His formal arterial studies from mid July are noted below. He was evaluated by Dr.Berry ABI Findings: +---------+------------------+-----+----------+--------+ Right Rt Pressure (mmHg)IndexWaveform Comment  +---------+------------------+-----+----------+--------+ Brachial 176     +---------+------------------+-----+----------+--------+ ATA 176 0.99 monophasic  +---------+------------------+-----+----------+--------+ PTA 183 1.03 monophasic  +---------+------------------+-----+----------+--------+ PERO 172 0.97 monophasic  +---------+------------------+-----+----------+--------+ Great Toe153 0.86    +---------+------------------+-----+----------+--------+ +---------+------------------+-----+-----------+-------+ Left Lt Pressure (mmHg)IndexWaveform Comment +---------+------------------+-----+-----------+-------+ Brachial 178     +---------+------------------+-----+-----------+-------+ ATA 162 0.91 multiphasic  +---------+------------------+-----+-----------+-------+ PTA 188 1.06 multiphasic  +---------+------------------+-----+-----------+-------+ PERO 158 0.89 monophasic   +---------+------------------+-----+-----------+-------+ Great Toe150 0.84    +---------+------------------+-----+-----------+-------+ +-------+-----------+-----------+------------+------------+ ABI/TBIToday's ABIToday's TBIPrevious ABIPrevious TBI +-------+-----------+-----------+------------+------------+ Right 1.03 0.86 1.11   +-------+-----------+-----------+------------+------------+ Left 1.06 0.84 1.27   +-------+-----------+-----------+------------+------------+ Tibial waveforms somewhat difficult to record due to irregular heartbeat. Bilateral ABIs appear essentially unchanged compared to prior study on 06/21/15. Summary: Right: Resting  right ankle-brachial index is within normal range. No evidence of significant right lower extremity arterial disease. The right toe-brachial index is normal. Although ankle brachial indices are within normal limits (0.95-1.29), arterial Doppler waveforms at the ankle suggest some component of arterial occlusive disease. Left: Resting left ankle-brachial index is within normal range. No evidence of significant left lower extremity arterial disease. The left toe-brachial index is normal. Although ankle brachial indices are within normal limits (0.95-1.29), arterial Doppler waveforms at the ankle suggest some component of arterial occlusive disease. 12/15; the patient's area on the left mid tibia looks better. Right posterior calf also better. He has the area on the left foot as well. All of his toes look better I think this was tinea. We are using Hydrofera Blue everywhere else The patient was in urgent care yesterday with wheezing and shortness of breath. He got azithromycin and prednisone.  He feels better. His lungs are currently clear to auscultation. He was not tested for Covid 19 12/29; the patient arrives in clinic today with quite a bit change. 2 weeks ago he only had areas on the left mid tibia right posterior calf with tinea pedis resolving between his toes. He arrives in clinic today with several areas on the dorsal toes on the right, dorsal left second toe. He has skin breakdown in the left medial calf probably from excess edema. Small area proximally in the medial calf. He has weeping edema fluid coming out of the skin excoriations on the left medial calf. He tells me that he is having a cardiac catheterization next week. I had a quick look at [facility] health link. He was found to have an ejection fraction of 25% during the work-up for persistent atrial fibrillation. He saw his cardiology office yesterday seen by the nurse practitioner. She increased his carvedilol. He has not been on  diuretics for apparently several months and indeed in the nurse practitioner Dietrich Pates notes she had knowledge of this. 10/04/2019 on evaluation today patient presents as a walk-in visit concerning the fact that he did not have an appointment here for our clinic at this point. He actually had a cardiac catheterization earlier today and then came from there to here in order to be evaluated. With that being said unfortunately he is having significant cellulitis of his left lower extremity upon evaluation today this appears to have deteriorated even since last week's evaluation with Dr. Dellia Nims. The right lower extremity is actually doing okay I really see no evidence of deterioration at this point at those locations. In fact the right leg seems in general be doing quite well. Nonetheless I am concerned about infection and cellulitis of the left lower extremity and again considering his weakened heart I do not want him to develop into sepsis at all. He is also having some trouble breathing today and I understand according to nursing staff this is always the case to some degree. With that being said the patient unfortunately seems to be in my opinion a little bit worse even his wife feels like that may be the case today. Unsure exactly what is leading to this. Cardiac catheterization I did review the report which showed an ejection fraction of 25% he also had an LAD blockage of around 25% based on what I saw. With that being said there was no significant blockages that required stenting at this point he does have weakened cardiac muscles compared to normal. 10/05/2019; patient was seen yesterday in clinic. He was sent to the ER because of cellulitis of the left leg possibility. In the ER he was given 1 dose of IV Levaquin and discharged on Keflex. He came in the clinic initially for a nurse visit to rewrap his left leg. We did not look at the right leg today that is an Haematologist. The patient also had a  cardiac cath. According to him there were no blockages but a very low ejection fraction. 1/12; back for an early follow-up. The condition of the left lower leg is a lot better although there are multiple open areas. All of them with not a particularly viable surface. On the right posterior calf he has a single wound with a clean surface. He has a wound with exposed bone on the PIP of the left second toe dorsally. He has wounds on the dorsal right first second and third toes. His arterial studies were normal.  1/19; the patient has 3 open wounds on the left leg anteriorly in the mid tibia, distally and medially just above the ankle and posteriorly. On the right he has a small area dime sized on the right posterior calf. His edema control is a lot better. He still has wounds on the right second and left second toes. The left second toe has exposed bone. X- ray I did last week did not show evidence of osteomyelitis in the left foot. 1/26; patient with a multiplicity of wounds and problems. ooOn the left he has a circular area on the left anterior mid tibia ooLeft just above the medical ankle -left 2nd toe pip -right 2nd toe right posterior calf 2/2; patient with a multiplicity of wounds and problems. He has severe chronic venous insufficiency and the wounds on his leg are all on the left left anterior left medial at the medial malleolus and left posterior. We have been using Iodoflex to these areas to help with ongoing debridement. He also has largely traumatic wounds on his toes this includes the left second with exposed bone. Bone culture I did last week showed Staph aureus which is methicillin sensitive. I discussed with him today the idea of an amputation of this toe because it is literally nonfunctional however he wants to try antibiotics. Antibiotic choice is complicated by the fact that he is on Coumadin. He also has wounds over the dorsal part of the right first which is close to closed.  Right second toe has exposed bone and the right third toe at the base of the right third toe is just about closed as well. We have been using silver alginate 2/9; patient with severe chronic venous insufficiency. He has large wounds on the left anterior mid tibia and on the left medial just above the ankle. I am concerned today about the depth of the area on the left mid tibia may be exposing into the muscle. He has small areas on the left posterior calf and on the right posterior calf although these do not look as threatening. He also has traumatic wounds on his toes. The right first and third are closed however the bilateral second toes have exposed bone. The area on the left is open into the distal interphalangeal joint. In my opinion these toes are nonsalvageable and will need amputation 2/16; patient with severe chronic venous insufficiency. He has wounds on the left anterior mid tibia left medial lower leg just above the ankle. He has small areas on the left posterior calf and a more prominent area on the right posterior calf. All of these are related to poorly controlled venous hypertension. He also had traumatic wounds on the PIPs of both second toes. Unfortunately both of these have exposed bone and in the case of the left this I think goes right into the joint itself. I do not think either 1 of these is salvageable. I have reviewed his arterial evaluation that was done in July last year. He also saw Dr. [name] in June. He felt these were venous stasis. Previously had segmental arterial pressures performed in 11/03/2014 which were normal. He was sent for for a follow-up arterial evaluation on July 1. On the right his ABIs were 1.03 with a TBI of 0.86 although his waveforms were monophasic on the left he had multiphasic waveforms at the ATA PTA but monophasic at the peroneal nevertheless his ABI was normal at 1.06 TBI also normal at 0.84. I think he has enough blood flow  to heal an  amputation which I think he needs of the left second toe and probably the right second toe as well Objective Constitutional Vitals Time Taken: 8:17 AM, Height: 74 in, Source: Stated, Weight: 212 lbs, Source: Stated, BMI: 27.2, Temperature: 97.7 F, Pulse: 69 bpm, Respiratory Rate: 20 breaths/min, Blood Pressure: 93/61 mmHg. Cardiovascular Dorsalis pedis pulses are palpable although his feet are cold. General Notes: Wound exam ooThe left anterior leg wounds are debrided with Anasept and gauze. This includes the anterior tibial and medial calf just above the ankle. Posteriorly a small wound on the left and a more prominent wound on the right but both of these appear healthy. ooThe areas on the left second toe showed exposed bone and probably expansion into the joint itself over the PIP. He also has exposed bone on the right. Integumentary (Hair, Skin) Wound #13 status is Open. Original cause of wound was Gradually Appeared. The wound is located on the Left Toe Second. The wound measures 1.9cm length x 1.8cm width x 0.6cm depth; 2.686cm^2 area and 1.612cm^3 volume. There is bone, joint, and Fat Layer (Subcutaneous Tissue) Exposed exposed. There is no tunneling or undermining noted. There is a small amount of serosanguineous drainage noted. The wound margin is distinct with the outline attached to the wound base. There is no granulation within the wound bed. There is a large (67-100%) amount of necrotic tissue within the wound bed including Eschar and Adherent Slough. Wound #19 status is Open. Original cause of wound was Gradually Appeared. The wound is located on the Left,Anterior Lower Leg. The wound measures 2.2cm length x 1.3cm width x 0.1cm depth; 2.246cm^2 area and 0.225cm^3 volume. There is Fat Layer (Subcutaneous Tissue) Exposed exposed. There is no tunneling or undermining noted. There is a medium amount of serosanguineous drainage noted. The wound margin is flat and intact. There is  medium (34-66%) pink granulation within the wound bed. There is a medium (34-66%) amount of necrotic tissue within the wound bed including Adherent Slough. Wound #20 status is Open. Original cause of wound was Gradually Appeared. The wound is located on the Right Toe Great. The wound measures 0.8cm length x 0.2cm width x 0.1cm depth; 0.126cm^2 area and 0.013cm^3 volume. The wound is limited to skin breakdown. There is no tunneling or undermining noted. There is a small amount of serosanguineous drainage noted. The wound margin is distinct with the outline attached to the wound base. There is large (67-100%) pink granulation within the wound bed. There is no necrotic tissue within the wound bed. Wound #21 status is Open. Original cause of wound was Gradually Appeared. The wound is located on the Right Toe Second. The wound measures 1.4cm length x 1cm width x 0.1cm depth; 1.1cm^2 area and 0.11cm^3 volume. There is bone and Fat Layer (Subcutaneous Tissue) Exposed exposed. There is no tunneling or undermining noted. There is a none present amount of drainage noted. The wound margin is flat and intact. There is no granulation within the wound bed. There is a large (67-100%) amount of necrotic tissue within the wound bed including Eschar and Adherent Slough. Wound #24 status is Open. Original cause of wound was Gradually Appeared. The wound is located on the [facility] Lower Leg. The wound measures 3.6cm length x 1.5cm width x 0.1cm depth; 4.241cm^2 area and 0.424cm^3 volume. There is Fat Layer (Subcutaneous Tissue) Exposed exposed. There is no tunneling or undermining noted. There is a medium amount of serosanguineous drainage noted. The wound margin is flat and intact.  There is large (67-100%) red, pink granulation within the wound bed. There is a small (1-33%) amount of necrotic tissue within the wound bed including Adherent Slough. Wound #25 status is Open. Original cause of wound was  Gradually Appeared. The wound is located on the Left,Distal,Posterior Lower Leg. The wound measures 1.8cm length x 1.8cm width x 0.1cm depth; 2.545cm^2 area and 0.254cm^3 volume. There is Fat Layer (Subcutaneous Tissue) Exposed exposed. There is no tunneling or undermining noted. There is a small amount of serosanguineous drainage noted. The wound margin is flat and intact. There is small (1-33%) pink granulation within the wound bed. There is a large (67-100%) amount of necrotic tissue within the wound bed including Adherent Slough. Wound #26 status is Healed - Epithelialized. Original cause of wound was Gradually Appeared. The wound is located on the Left,Medial Lower Leg. The wound measures 0cm length x 0cm width x 0cm depth; 0cm^2 area and 0cm^3 volume. Wound #28 status is Open. Original cause of wound was Gradually Appeared. The wound is located on the Left,Proximal,Posterior Lower Leg. The wound measures 1.2cm length x 0.5cm width x 0.1cm depth; 0.471cm^2 area and 0.047cm^3 volume. There is Fat Layer (Subcutaneous Tissue) Exposed exposed. There is no tunneling or undermining noted. There is a small amount of serosanguineous drainage noted. The wound margin is flat and intact. There is medium (34-66%) pink granulation within the wound bed. There is a medium (34-66%) amount of necrotic tissue within the wound bed including Adherent Slough. Wound #3R status is Open. Original cause of wound was Gradually Appeared. The wound is located on the Right,Posterior Calf. The wound measures 2.5cm length x 2.7cm width x 0.1cm depth; 5.301cm^2 area and 0.53cm^3 volume. There is Fat Layer (Subcutaneous Tissue) Exposed exposed. There is no tunneling or undermining noted. There is a none present amount of drainage noted. The wound margin is distinct with the outline attached to the wound base. There is large (67-100%) red granulation within the wound bed. There is no necrotic tissue within the wound  bed. 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 There was a Haematologist Compression Therapy Procedure by [name], RN. Post procedure Diagnosis Wound #19: Same as Pre-Procedure Wound #24 Pre-procedure diagnosis of Wound #24 is a Venous Leg Ulcer located on the Left,Distal,Anterior Lower Leg . There was a Haematologist Compression Therapy Procedure by [name], RN. Post procedure Diagnosis Wound #24: Same as Pre-Procedure Wound #25 Pre-procedure diagnosis of Wound #25 is a Venous Leg Ulcer located on the Left,Distal,Posterior Lower Leg . There was a Haematologist Compression Therapy Procedure by [name], RN. Post procedure Diagnosis Wound #25: Same as Pre-Procedure Wound #28 Pre-procedure diagnosis of Wound #28 is a Diabetic Wound/Ulcer of the Lower Extremity located on the Left,Proximal,Posterior Lower Leg . There was a Haematologist Compression Therapy Procedure by [name], RN. Post procedure Diagnosis Wound #28: Same  as Pre-Procedure Wound #3R Pre-procedure diagnosis of Wound #3R is a Diabetic Wound/Ulcer of the Lower Extremity located on the Right,Posterior Calf . There was a Haematologist Compression Therapy Procedure by [name], RN. Post procedure  Diagnosis Wound #3R: Same as Pre-Procedure Plan Follow-up Appointments: Return Appointment in 1 week. - Tuesday **********EXTRA TIME**************** Dressing Change Frequency: Wound #13 Left Toe Second: Change dressing every day. Wound #19 Left,Anterior Lower Leg: Do not change entire dressing for one week. Wound #20 Right Toe Great: Change dressing every day. Wound #21 Right Toe Second: Change dressing every day. Wound #24 Left,Distal,Anterior Lower Leg: Do not change entire dressing for one week. Wound #25 Left,Distal,Posterior Lower Leg: Do not change entire dressing for one week. Wound #3R Right,Posterior Calf: Do not change entire dressing for one week. Skin Barriers/Peri-Wound Care: TCA Cream or Ointment Wound Cleansing: May shower with protection. Primary Wound Dressing: Wound #13 Left Toe Second: Calcium Alginate with Silver Wound #19 Left,Anterior Lower Leg: Silver Collagen Wound #20 Right Toe Great: Calcium Alginate with Silver Wound #21 Right Toe Second: Calcium Alginate with Silver Wound #24 Left,Distal,Anterior Lower Leg: Silver Collagen Wound #25 Left,Distal,Posterior Lower Leg: Silver Collagen Wound #3R Right,Posterior Calf: Silver Collagen Secondary Dressing: Dry Gauze - secure toes with tape Edema Control: Unna Boots Bilaterally Off-Loading: Open toe surgical shoe to: - left and right foot 1. Bilateral chronic venous insufficiency wounds these appear to be responding using silver collagen 2. I think the patient is going to need an amputation of the left second and probably the right second toes he sees Dr. March Rummage at [facility] tomorrow 3. I have reviewed his arterial studies. Although he went to see Dr. [name] before he had the arterial studies which is the office in a way I like things done it would appear that he does not have an arterial issue that should preclude healing of the second toes amputations. 4. In the past we have healed this  man out but he does not seem to be able to maintain edema control. His wounds are generally smaller Electronic Signature(s) Signed: 11/14/2019 5:18:57 PM By: [name] MD Entered By: [name] on 11/14/2019 09:25:55 -------------------------------------------------------------------------------- SuperBill Details Patient Name: Date of Service: [name] 11/14/2019 Medical Record [id number] Patient Account Number: [id number] Date of Birth/Sex: Treating RN: [birth year] (79 y.o. [name]) Dolores Lory, [place] Primary Care Provider: Hurshel Party Other Clinician: Referring Provider: Treating Provider/Extender:Malaia Buchta, Cheryl Flash, Hunter Weeks in Treatment: 37 Diagnosis Coding ICD-10 Codes Code Description L97.211 Non-pressure chronic ulcer of right calf limited to breakdown of skin L97.221 Non-pressure chronic ulcer of left calf limited to breakdown of skin I87.333 Chronic venous hypertension (idiopathic) with ulcer and inflammation of bilateral lower extremity E11.51 Type 2 diabetes mellitus with diabetic peripheral angiopathy without gangrene E11.42 Type 2 diabetes mellitus with diabetic polyneuropathy L97.524 Non-pressure chronic ulcer of other part of left foot with necrosis of bone L97.511 Non-pressure chronic ulcer of other part of right foot limited to breakdown of skin E36.62 Chronic systolic (congestive) heart failure H47.654 Other chronic osteomyelitis, left ankle and foot Facility Procedures The patient participates with Medicare or their insurance follows the Medicare Facility Guidelines: CPT4 Code Description Modifier Quantity [id number] - APPLY UNNA BOOT/PROFO BILATERAL 1 Physician Procedures CPT4 Code Description: 5170017 [id number] - WC PHYS LEVEL 4 - EST PT ICD-10 Diagnosis Description L97.211 Non-pressure chronic ulcer of right calf limited to bre L97.221 Non-pressure chronic ulcer of left calf limited to brea L97.524 Non-pressure chronic  ulcer of other  part of left foot w L97.511 Non-pressure chronic ulcer of other part of right foot Modifier: akdown of skin kdown of skin ith necrosis of b limited to breakd Quantity: 1 one own of skin Electronic Signature(s) Signed: 11/14/2019 5:18:57 PM By: [name] MD Entered By: [name] on 11/14/2019 18:98:42

## 2019-11-15 NOTE — Progress Notes (Signed)
Cardiology Office Note    Date:  11/16/2019   ID:  [name], DOB 1941-03-20, MRN [id number]  PCP:  Daniel Albee, MD  Cardiologist: Daniel Sable, MD    Chief Complaint  Patient presents with  . Follow-up    1 month visit    History of Present Illness:    [name] is a 79 y.o. male with past medical history of chronic combined systolic and diastolic CHF/NICM (EF [id number] in 2016, at 20-25% by repeat echo in 08/2019), CAD (nonobstructive CAD by cath in 09/2019), permanent atrial fibrillation (on Coumadin), HTN, and HLD who presents to the office today for 1 month follow-up.  He was last examined by Daniel Ferries, PA-C on 10/13/2019 following his recent cardiac catheterization which showed widely patent coronary arteries with trivial mid LAD stenosis less than 25% and mild pulmonary hypertension with mean pulmonary artery pressure of 31 mmHg. LVEDP was elevated at 21 mmHg. At the time of his follow-up visit, he reported that his breathing was overall stable and he denied any recent chest pain or palpitations. He did have chronic wounds along his legs and was going to the wound center on a weekly basis.  He had been on Lasix 40 mg daily and this was reduced to 20 mg daily alternating with 40 mg daily. There was no indication for a LifeVest given his NICM and continued medical therapy was recommended. He was recommended to consider changing Coreg to Toprol-XL in the future to see if heart rate control would improve. Labs did show kidney function remained stable and he was started on Entresto 24-26mg  BID. He did have repeat labs on 11/08/2019 and renal function remained stable with creatinine at 1.13 and electrolytes within normal limits.  In talking with the patient and his wife today, he reports overall doing well since his last office visit. He reports his breathing continues to improve and he denies any recent orthopnea or PND. He does have lower extremity edema and chronic  wounds along his lower extremities which is followed by the wound center. He denies any recent chest pain or palpitations. They do not check his blood pressure regularly at home but it is well controlled at 112/70 during today's visit.  Past Medical History:  Diagnosis Date  . Cancer ([place])   . CHF (congestive heart failure) ([place])   . Chronic atrial fibrillation ([place])   . Chronic bronchitis   . Diabetes mellitus, type II ([place])    no insulin  . Gout   . History of herpes zoster virus   . History of radiation therapy 12/21/12- 02/15/13   prostate 78 gray in 40 fx  . Hyperlipidemia   . Hypertension   . Prostate cancer ([place]) 2014   EBRT + hormonal therapy  . Vitamin D deficiency disease 06/07/2019    Past Surgical History:  Procedure Laterality Date  . KNEE SURGERY     left knee  . PROSTATE BIOPSY    . RIGHT/LEFT HEART CATH AND CORONARY ANGIOGRAPHY N/A 10/04/2019   Procedure: RIGHT/LEFT HEART CATH AND CORONARY ANGIOGRAPHY;  Surgeon: Daniel Crome, MD;  Location: [facility];  Service: Cardiovascular;  Laterality: N/A;    Current Medications: Outpatient Medications Prior to Visit  Medication Sig Dispense Refill  . acetaminophen (TYLENOL) 500 MG tablet Take 500 mg by mouth every 6 (six) hours as needed for mild pain or headache.     . allopurinol (ZYLOPRIM) 300 MG tablet TAKE 1 TABLET TWICE DAILY (  Patient taking differently: Take 300 mg by mouth daily. ) 180 tablet 0  . ascorbic acid (VITAMIN C) 250 MG CHEW Chew 500 mg by mouth daily as needed (Cold).     . carvedilol (COREG) 25 MG tablet Take 1.5 tablets (37.5 mg total) by mouth 2 (two) times daily. 270 tablet 3  . cephALEXin (KEFLEX) 500 MG capsule Take 1 capsule (500 mg total) by mouth 4 (four) times daily. 28 capsule 0  . Cholecalciferol (VITAMIN D-3) 125 MCG (5000 UT) TABS Take 5,000 Units by mouth daily.     . furosemide (LASIX) 40 MG tablet Take 40 mg one day, alternating with 20 mg the other day 90 tablet 3  . glipiZIDE  (GLUCOTROL) 5 MG tablet TAKE 1 TABLET EVERY DAY (Patient taking differently: Take 5 mg by mouth daily. ) 90 tablet 0  . guaifenesin (WAL-TUSSIN) 100 MG/5ML syrup Take 200 mg by mouth 3 (three) times daily as needed for cough.     . lovastatin (MEVACOR) 40 MG tablet TAKE 1 TABLET EVERY DAY (Patient taking differently: Take 40 mg by mouth at bedtime. ) 90 tablet 0  . NON FORMULARY Take 3 tablets by mouth daily. MANGA-CAL    . vitamin E 400 UNIT capsule Take 800 Units by mouth daily.     Marland Kitchen warfarin (COUMADIN) 5 MG tablet TAKE 1 TABLET EVERY DAY (Patient taking differently: Take 5 mg by mouth See admin instructions. 2.5 mg on Saturday All the other day take 5 mg) 90 tablet 0  . zinc gluconate 50 MG tablet Take 25 mg by mouth daily.     Marland Kitchen lisinopril (ZESTRIL) 20 MG tablet TAKE 1 TABLET TWICE DAILY (Patient taking differently: Take 20 mg by mouth daily. ) 180 tablet 0  . sacubitril-valsartan (ENTRESTO) 24-26 MG Take 1 tablet by mouth 2 (two) times daily. 60 tablet 0  . spironolactone (ALDACTONE) 25 MG tablet Take 1 tablet (25 mg total) by mouth daily. 90 tablet 3   No facility-administered medications prior to visit.     Allergies:   Clarithromycin, Fluoride preparations, and Toprol xl [metoprolol tartrate]   Social History   Socioeconomic History  . Marital status: Married    Spouse name: Not on file  . Number of children: Not on file  . Years of education: Not on file  . Highest education level: Not on file  Occupational History  . Not on file  Tobacco Use  . Smoking status: Never Smoker  . Smokeless tobacco: Never Used  Substance and Sexual Activity  . Alcohol use: No    Alcohol/week: 0.0 standard drinks  . Drug use: No  . Sexual activity: Not on file  Other Topics Concern  . Not on file  Social History Narrative  . Not on file   Social Determinants of Health   Financial Resource Strain:   . Difficulty of Paying Living Expenses: Not on file  Food Insecurity:   . Worried  About Charity fundraiser in the Last Year: Not on file  . Ran Out of Food in the Last Year: Not on file  Transportation Needs:   . Lack of Transportation (Medical): Not on file  . Lack of Transportation (Non-Medical): Not on file  Physical Activity:   . Days of Exercise per Week: Not on file  . Minutes of Exercise per Session: Not on file  Stress:   . Feeling of Stress : Not on file  Social Connections:   . Frequency of Communication with Friends  and Family: Not on file  . Frequency of Social Gatherings with Friends and Family: Not on file  . Attends Religious Services: Not on file  . Active Member of Clubs or Organizations: Not on file  . Attends Archivist Meetings: Not on file  . Marital Status: Not on file     Family History:  The patient's family history includes Heart attack in his father; Kidney failure in his mother.   Review of Systems:   Please see the history of present illness.     General:  No chills, fever, night sweats or weight changes.  Cardiovascular:  No chest pain, dyspnea on exertion, edema, orthopnea, palpitations, paroxysmal nocturnal dyspnea. Dermatological: No rash. Positive for wounds along lower extremities.  Respiratory: No cough, dyspnea Urologic: No hematuria, dysuria Abdominal:   No nausea, vomiting, diarrhea, bright red blood per rectum, melena, or hematemesis Neurologic:  No visual changes, wkns, changes in mental status. All other systems reviewed and are otherwise negative except as noted above.   Physical Exam:    VS:  BP 112/70   Pulse 70   Temp 98.2 F (36.8 C)   Ht 6' 2.5" (1.892 m)   Wt 201 lb 3.2 oz (91.3 kg)   SpO2 96%   BMI 25.49 kg/m    General: Well developed, well nourished,male appearing in no acute distress. Head: Normocephalic, atraumatic, sclera non-icteric, no xanthomas, nares are without discharge.  Neck: No carotid bruits. JVD not elevated.  Lungs: Respirations regular and unlabored, without wheezes or  rales.  Heart: Irregularly irregular. No S3 or S4.  No murmur, no rubs, or gallops appreciated. Abdomen: Soft, non-tender, non-distended with normoactive bowel sounds. No hepatomegaly. No rebound/guarding. No obvious abdominal masses. Msk:  Strength and tone appear normal for age. No joint deformities or effusions. Extremities: No clubbing or cyanosis. No lower extremity edema appreciated but his legs are currently wrapped which hinders evaluation.  Neuro: Alert and oriented X 3. Moves all extremities spontaneously. No focal deficits noted. Psych:  Responds to questions appropriately with a normal affect. Skin: No rashes or lesions noted  Wt Readings from Last 3 Encounters:  11/15/19 201 lb 3.2 oz (91.3 kg)  10/13/19 195 lb 12.8 oz (88.8 kg)  10/04/19 222 lb (100.7 kg)    Studies/Labs Reviewed:   EKG:  EKG is not ordered today.   Recent Labs: 06/07/2019: ALT 42 09/26/2019: B Natriuretic Peptide 1,263.0 10/05/2019: Hemoglobin 13.7; Platelets 189 11/08/2019: BUN 39; Creatinine, Ser 1.13; Potassium 4.9; Sodium 135   Lipid Panel    Component Value Date/Time   CHOL 196 02/13/2011 1055   TRIG 92 02/13/2011 1055   TRIG 110 01/21/2009 0000   HDL 46 02/13/2011 1055   CHOLHDL 4.3 02/13/2011 1055   VLDL 18 02/13/2011 1055   LDLCALC 132 (H) 02/13/2011 1055   LDLCALC 152 01/21/2009 0000    Additional studies/ records that were reviewed today include:   Echocardiogram: 08/2019 IMPRESSIONS    1. Left ventricular ejection fraction, by visual estimation, is 20 to  25%. The left ventricle has severely decreased function. There is mildly  increased left ventricular hypertrophy.  2. Abnormal septal motion consistent with left bundle branch block.  3. Left ventricular diastolic parameters are indeterminate.  4. Mildly dilated left ventricular internal cavity size.  5. The left ventricle demonstrates global hypokinesis.  6. Global right ventricle has normal systolic function.The right    ventricular size is normal. No increase in right ventricular wall  thickness.  7. Left  atrial size was severely dilated.  8. Right atrial size was severely dilated.  9. Mild mitral annular calcification.  10. The mitral valve is grossly normal. Mild mitral valve regurgitation.  11. The tricuspid valve is grossly normal. Tricuspid valve regurgitation  moderate.  12. The aortic valve is tricuspid and moderately calcificed with decreased  cusp excursion. Gradients are not significantly increased, but in the  setting of reduced LVEF, difficult to exclude low gradient aortic  stenosis. Aortic valve regurgitation is not  visualized.  13. The pulmonic valve was grossly normal. Pulmonic valve regurgitation is  trivial.  14. Moderately elevated pulmonary artery systolic pressure.  15. The tricuspid regurgitant velocity is 3.23 m/s, and with an assumed  right atrial pressure of 15 mmHg, the estimated right ventricular systolic  pressure is moderately elevated at 56.7 mmHg.   Cardiac Catheterization: 09/2019  Widely patent coronary arteries, essentially normal for the patient's age.  Trivial mid LAD less than 25% eccentric plaque.  Mild pulmonary hypertension with mean pulmonary artery pressure of 31 mmHg  Severe left ventricular systolic dysfunction with EF less than 25%.  Elevated LVEDP at 21 mmHg consistent with acute on chronic combined systolic and diastolic heart failure.  RECOMMENDATIONS:   Guideline directed therapy for heart failure and atrial fibrillation rate control.  Heart rate was relatively fast during the procedure.  Resume usual Coumadin dose later today.  Discharge home later today.  Assessment:    1. Chronic systolic congestive heart failure ([place])   2. NICM (nonischemic cardiomyopathy) ([place])   3. Permanent atrial fibrillation ([place])   4. Coronary artery disease involving native coronary artery of native heart without angina pectoris   5. Essential  hypertension      Plan:   In order of problems listed above:  1. Chronic Systolic CHF/NICM - EF was previously 45% in 2016 and further reduced at 20 to 25% by repeat imaging in 08/2019. Cardiac catheterization last month showed nonobstructive CAD which is consistent with NICM. - He is currently on Coreg 37.5 mg twice daily, Entresto 24-26 mg twice daily, Spironolactone 25 mg daily and Lasix 40 mg daily alternating with 20 mg daily. BP does not allow for further titration of his medication regimen at this time. Will plan for a repeat echocardiogram in 12/2019 as he would have been on optimal medical therapy for 3 months. If EF remains below 35%, would consider EP referral to discuss ICD placement and possible CRT given LBBB on most recent EKG (reviewed this with the patient and his wife today).   2. Permanent Atrial Fibrillation - He denies any recent palpitations and heart rate is well controlled in the 70's during today's visit. Continue Coreg 37.5 mg twice daily. He was intolerant to Toprol-XL in the past and would avoid Cardizem given his cardiomyopathy. - He denies any evidence of active bleeding. Remains on Coumadin for anticoagulation. INR at 3.0 when checked earlier today and is followed by the [facility].   3. CAD - Recent cardiac catheterization in 10/2019 showed nonobstructive CAD as outlined above.  He remains on beta-blocker and statin therapy with Lovastatin 40 mg daily. He is not on ASA given the need for anticoagulation.  4. HTN - BP is well controlled at 112/70 during today's visit. Continue current medication regimen.   Medication Adjustments/Labs and Tests Ordered: Current medicines are reviewed at length with the patient today.  Concerns regarding medicines are outlined above.  Medication changes, Labs and Tests ordered today are listed in the Patient  Instructions below. Patient Instructions  Medication Instructions:  Your physician recommends that you continue on  your current medications as directed. Please refer to the Current Medication list given to you today.  *If you need a refill on your cardiac medications before your next appointment, please call your pharmacy*  Lab Work: NONE   If you have labs (blood work) drawn today and your tests are completely normal, you will receive your results only by: Marland Kitchen MyChart Message (if you have MyChart) OR . A paper copy in the mail If you have any lab test that is abnormal or we need to change your treatment, we will call you to review the results.  Testing/Procedures: Your physician has requested that you have an echocardiogram. Echocardiography is a painless test that uses sound waves to create images of your heart. It provides your doctor with information about the size and shape of your heart and how well your heart's chambers and valves are working. This procedure takes approximately one hour. There are no restrictions for this procedure.    Follow-Up: At [facility], you and your health needs are our priority.  As part of our continuing mission to provide you with exceptional heart care, we have created designated Provider Care Teams.  These Care Teams include your primary Cardiologist (physician) and Advanced Practice Providers (APPs -  Physician Assistants and Nurse Practitioners) who all work together to provide you with the care you need, when you need it.  Your next appointment:   2 month(s)  The format for your next appointment:   In Person  Provider:   Kate Sable, MD  Other Instructions Thank you for choosing [facility]!    Signed, Erma Heritage, PA-C  11/16/2019 10:15 AM    [facility] [address] [place], [place] [address] Phone: [phone] Fax: [phone]

## 2019-11-15 NOTE — Progress Notes (Signed)
[name], [name] ([id number]) Visit Report for 11/14/2019 Arrival Information Details Patient Name: Date of Service: [name], [name] 11/14/2019 8:00 AM Medical Record [place] Patient Account Number: [id number] Date of Birth/Sex: Treating RN: 1941/04/10 (79 y.o. [name] Primary Care Angus Amini: [place]hel Party Other Clinician: Referring Mayvis Agudelo: Treating Yohanna Tow/Extender:Robson, Cheryl Flash, [place] Weeks in Treatment: 82 Visit Information History Since Last Visit All ordered tests and consults were completed: No Patient Arrived: Wheel Chair Added or deleted any medications: No Arrival Time: 08:14 Any new allergies or adverse reactions: No Accompanied By: spouse Had a fall or experienced change in No Transfer Assistance: None activities of daily living that may affect Patient Identification Verified: Yes risk of falls: Secondary Verification Process Yes Signs or symptoms of abuse/neglect since last No Completed: visito Patient Requires Transmission- No Hospitalized since last visit: No Based Precautions: Implantable device outside of the clinic excluding No Patient Has Alerts: Yes cellular tissue based products placed in the center Patient Alerts: Patient on Blood since last visit: Thinner Has Dressing in Place as Prescribed: Yes Has Compression in Place as Prescribed: Yes Pain Present Now: Yes Notes podiatry appointment Thurs Electronic Signature(s) Signed: 11/14/2019 11:16:04 AM By: Baruch Gouty RN, BSN Entered By: Baruch Gouty on 11/14/2019 08:17:16 -------------------------------------------------------------------------------- Compression Therapy Details Patient Name: Date of Service: Daniel Pia D. 11/14/2019 8:00 AM Medical Record [id number] Patient Account Number: [id number] Date of Birth/Sex: Treating RN: 03/25/1941 (78 y.o. [name]) [name] Primary Care Tyrice Hewitt: [place] Party Other Clinician: Referring Elim Economou:  Treating Disha Cottam/Extender:Robson, Cheryl Flash, [place] Weeks in Treatment: 37 Compression Therapy Performed for Wound Wound #19 Left,Anterior Lower Leg Assessment: Performed By: Jake Church, RN Compression Type: Rolena Infante Post Procedure Diagnosis Same as Pre-procedure Electronic Signature(s) Signed: 11/15/2019 5:45:19 PM By: [name] RN Entered By: [name] on 11/14/2019 09:19:18 -------------------------------------------------------------------------------- Compression Therapy Details Patient Name: Date of Service: [name], [name] 11/14/2019 8:00 AM Medical Record [id number] Patient Account Number: [id number] Date of Birth/Sex: Treating RN: 1941-09-09 (78 y.o. [name]) [name] Primary Care Yesica Kemler: [place] Party Other Clinician: Referring Strummer Canipe: Treating Rolinda Impson/Extender:Robson, Cheryl Flash, [place] Weeks in Treatment: 37 Compression Therapy Performed for Wound Wound #24 Left,Distal,Anterior Lower Leg Assessment: Performed By: Jake Church, RN Compression Type: Rolena Infante Post Procedure Diagnosis Same as Pre-procedure Electronic Signature(s) Signed: 11/15/2019 5:45:19 PM By: [name] RN Entered By: [name] on 11/14/2019 09:19:18 -------------------------------------------------------------------------------- Compression Therapy Details Patient Name: Date of Service: [name], [name] 11/14/2019 8:00 AM Medical Record [id number] Patient Account Number: [id number] Date of Birth/Sex: Treating RN: Jun 25, 1941 (78 y.o. [name]) [name] Primary Care Kaedin Hicklin: [place] Party Other Clinician: Referring Lamonda Noxon: Treating Salik Grewell/Extender:Robson, Cheryl Flash, [place] Weeks in Treatment: 37 Compression Therapy Performed for Wound Wound #25 Left,Distal,Posterior Lower Leg Assessment: Performed By: Jake Church, RN Compression Type: Rolena Infante Post Procedure Diagnosis Same as Pre-procedure Electronic  Signature(s) Signed: 11/15/2019 5:45:19 PM By: [name] RN Entered By: [name] on 11/14/2019 09:19:18 -------------------------------------------------------------------------------- Compression Therapy Details Patient Name: Date of Service: [name], [name] 11/14/2019 8:00 AM Medical Record [id number] Patient Account Number: [id number] Date of Birth/Sex: Treating RN: 11-03-1940 (78 y.o. [name]) [name] Primary Care Shilah Hefel: [place] Party Other Clinician: Referring Coleen Cardiff: Treating Manasvi Dickard/Extender:Robson, Cheryl Flash, [place] Weeks in Treatment: 37 Compression Therapy Performed for Wound Wound #28 Left,Proximal,Posterior Lower Leg Assessment: Performed By: Jake Church, RN Compression Type: Rolena Infante Post Procedure Diagnosis Same as Pre-procedure Electronic Signature(s) Signed: 11/15/2019 5:45:19 PM By: [name] RN Entered By: [name] on 11/14/2019 09:19:18 -------------------------------------------------------------------------------- Compression Therapy Details  Patient Name: Date of Service: [name], [name] 11/14/2019 8:00 AM Medical Record [place] Patient Account Number: [id number] Date of Birth/Sex: Treating RN: 03/11/1941 (78 y.o. [name]) [name] Primary Care Jamielee Mchale: [place] Party Other Clinician: Referring Junelle Hashemi: Treating Kellina Dreese/Extender:Robson, Cheryl Flash, [place] Weeks in Treatment: 37 Compression Therapy Performed for Wound Wound #3R Right,Posterior Calf Assessment: Performed By: Jake Church, RN Compression Type: Rolena Infante Post Procedure Diagnosis Same as Pre-procedure Electronic Signature(s) Signed: 11/15/2019 5:45:19 PM By: [name] RN Entered By: [name] on 11/14/2019 09:19:19 -------------------------------------------------------------------------------- Encounter Discharge Information Details Patient Name: Date of Service: Daniel Pia D. 11/14/2019 8:00 AM Medical  Record [id number] Patient Account Number: [id number] Date of Birth/Sex: Treating RN: 1940-12-29 (79 y.o. [name] Primary Care Loralee Weitzman: H[place]hel Party Other Clinician: Referring Arber Wiemers: Treating Topanga Alvelo/Extender:Robson, Cheryl Flash, [place] Weeks in Treatment: 37 Encounter Discharge Information Items Discharge Condition: Stable Ambulatory Status: Wheelchair Discharge Destination: Home Transportation: Private Auto Accompanied By: wife Schedule Follow-up Appointment: Yes Clinical Summary of Care: Patient Declined Electronic Signature(s) Signed: 11/14/2019 5:22:24 PM By: Kela Millin Entered By: Kela Millin on 11/14/2019 09:44:40 -------------------------------------------------------------------------------- Lower Extremity Assessment Details Patient Name: Date of Service: [name], [name] 11/14/2019 8:00 AM Medical Record [id number] Patient Account Number: [id number] Date of Birth/Sex: Treating RN: [birth year] (79 y.o. [name] Primary Care Rogen Porte: [place] Party Other Clinician: Referring Jessamine Barcia: Treating Bettyann Birchler/Extender:Robson, Cheryl Flash, [place] Weeks in Treatment: 37 Edema Assessment Assessed: [Left: No] [Right: No] Edema: [Left: Yes] [Right: Yes] Calf Left: Right: Point of Measurement: 31 cm From Medial Instep 30.5 cm 30.1 cm Ankle Left: Right: Point of Measurement: 11 cm From Medial Instep 20.3 cm 20 cm Vascular Assessment Pulses: Dorsalis Pedis Palpable: [Left:Yes] [Right:No] Electronic Signature(s) Signed: 11/14/2019 11:16:04 AM By: Baruch Gouty RN, BSN Entered By: Baruch Gouty on 11/14/2019 08:31:24 -------------------------------------------------------------------------------- Multi-Disciplinary Care Plan Details Patient Name: Date of Service: Daniel Pia D. 11/14/2019 8:00 AM Medical Record [id number] Patient Account Number: [id number] Date of Birth/Sex: Treating RN: 1941-09-23 (78  y.o. [name]) [name] Primary Care Marris Frontera: [place] Party Other Clinician: Referring Garek Schuneman: Treating Alexy Heldt/Extender:Robson, Cheryl Flash, [place] Weeks in Treatment: 37 Active Inactive Wound/Skin Impairment Nursing Diagnoses: Knowledge deficit related to ulceration/compromised skin integrity Goals: Patient/caregiver will verbalize understanding of skin care regimen Date Initiated: 02/28/2019 Target Resolution Date: 12/01/2019 Goal Status: Active Ulcer/skin breakdown will have a volume reduction of 30% by week 4 Date Initiated: 02/28/2019 Date Inactivated: 04/04/2019 Target Resolution Date: 03/31/2019 Goal Status: Met Ulcer/skin breakdown will have a volume reduction of 50% by week 8 Date Initiated: 04/04/2019 Date Inactivated: 05/09/2019 Target Resolution Date: 05/05/2019 Goal Status: Met Ulcer/skin breakdown will have a volume reduction of 80% by week 12 Date Initiated: 05/09/2019 Date Inactivated: 06/13/2019 Target Resolution Date: 06/09/2019 Unmet Goal Status: Unmet Reason: comorbities/new wounds Ulcer/skin breakdown will heal within 14 weeks Date Initiated: 06/13/2019 Date Inactivated: 07/11/2019 Target Resolution Date: 07/07/2019 Unmet Goal Status: Unmet Reason: comorbityies Interventions: Assess patient/caregiver ability to obtain necessary supplies Assess patient/caregiver ability to perform ulcer/skin care regimen upon admission and as needed Assess ulceration(s) every visit Notes: Electronic Signature(s) Signed: 11/15/2019 5:45:19 PM By: [name] RN Entered By: [name] on 11/14/2019 08:04:13 -------------------------------------------------------------------------------- Pain Assessment Details Patient Name: Date of Service: [name], [name] 11/14/2019 8:00 AM Medical Record [id number] Patient Account Number: [id number] Date of Birth/Sex: Treating RN: September 10, 1941 (79 y.o. [name] Primary Care Horald Birky: H[place]hel Party Other  Clinician: Referring Jhania Etherington: Treating Chalisa Kobler/Extender:Robson, Cheryl Flash, [place] Weeks in Treatment: 37 Active Problems Location of Pain Severity and Description of Pain  Patient Has Paino Yes Site Locations Pain Location: Pain in Ulcers With Dressing Change: Yes Duration of the Pain. Constant / Intermittento Intermittent Rate the pain. Current Pain Level: 3 Worst Pain Level: 7 Least Pain Level: 0 Character of Pain Describe the Pain: Aching Pain Management and Medication Current Pain Management: Medication: Yes Is the Current Pain Management Adequate: Adequate How does your wound impact your activities of daily livingo Sleep: No Bathing: No Appetite: No Relationship With Others: No Bladder Continence: No Emotions: Yes Bowel Continence: No Hobbies: No Toileting: No Dressing: No Electronic Signature(s) Signed: 11/14/2019 11:16:04 AM By: Baruch Gouty RN, BSN Entered By: Baruch Gouty on 11/14/2019 08:30:00 -------------------------------------------------------------------------------- Patient/Caregiver Education Details Patient Name: Date of Service: Pinette, Shahin D. 2/16/2021andnbsp8:00 AM Medical Record [id number] Patient Account Number: [id number] Date of Birth/Gender: Treating RN: 1941/01/25 (78 y.o. [name]) Dolores Lory, Morey Hummingbird Primary Care Physician: H[place]hel Party Other Clinician: Referring Physician: Treating Physician/Extender:Robson, Cheryl Flash, [place] Weeks in Treatment: 61 Education Assessment Education Provided To: Patient Education Topics Provided Wound/Skin Impairment: Methods: Explain/Verbal Responses: State content correctly Electronic Signature(s) Signed: 11/15/2019 5:45:19 PM By: [name] RN Entered By: [name] on 11/14/2019 08:04:30 -------------------------------------------------------------------------------- Wound Assessment Details Patient Name: Date of Service: [name], [name] 11/14/2019 8:00 AM Medical  Record [id number] Patient Account Number: [id number] Date of Birth/Sex: Treating RN: [birth year] (78 y.o. [name]) [name] Primary Care Giannis Corpuz: [place] Party Other Clinician: Referring Jaquala Fuller: Treating Aquita Simmering/Extender:Robson, Cheryl Flash, [place] Weeks in Treatment: 37 Wound Status Wound Number: 13 Primary Diabetic Wound/Ulcer of the Lower Extremity Etiology: Wound Location: Left Toe Second Wound Open Wounding Event: Gradually Appeared Status: Date Acquired: 08/22/2019 Comorbid Cataracts, Hypertension, Peripheral Venous Weeks Of Treatment: 12 History: Disease, Type II Diabetes, Gout, Received Clustered Wound: No Radiation Photos Wound Measurements Length: (cm) 1.9 % Reduction Width: (cm) 1.8 % Reduction Depth: (cm) 0.6 Epitheliali Area: (cm) 2.686 Tunneling: Volume: (cm) 1.612 Underminin Wound Description Classification: Grade 2 Wound Margin: Distinct, outline attached Exudate Amount: Small Exudate Type: Serosanguineous Exudate Color: red, brown Wound Bed Granulation Amount: None Present (0%) Necrotic Amount: Large (67-100%) Necrotic Quality: Eschar, Adherent Slough Foul Odor After Cleansing: No Slough/Fibrino Yes Exposed Structure Fascia Exposed: No Fat Layer (Subcutaneous Tissue) Exposed: Yes Tendon Exposed: No Muscle Exposed: No Joint Exposed: Yes Bone Exposed: Yes in Area: 12.8% in Volume: -423.4% zation: None No g: No Treatment Notes Wound #13 (Left Toe Second) 1. Cleanse With Wound Cleanser 3. Primary Dressing Applied Calcium Alginate Ag 4. Secondary Dressing Dry Gauze Notes toe netting Electronic Signature(s) Signed: 11/14/2019 4:25:10 PM By: Mikeal Hawthorne EMT/HBOT Signed: 11/15/2019 5:45:19 PM By: [name] RN Previous Signature: 11/14/2019 11:16:04 AM Version By: Baruch Gouty RN, BSN Entered By: Mikeal Hawthorne on 11/14/2019 15:42:12 -------------------------------------------------------------------------------- Wound  Assessment Details Patient Name: Date of Service: Daniel Pia D. 11/14/2019 8:00 AM Medical Record [id number] Patient Account Number: [id number] Date of Birth/Sex: Treating RN: 12-Sep-1941 (78 y.o. [name]) Dolores Lory, Morey Hummingbird Primary Care Tejay Hubert: [place]hel Party Other Clinician: Referring Rian Koon: Treating Ansh Fauble/Extender:Robson, Cheryl Flash, [place] Weeks in Treatment: 37 Wound Status Wound Number: 19 Primary Venous Leg Ulcer Etiology: Wound Location: Left Lower Leg - Anterior Wound Open Wounding Event: Gradually Appeared Status: Date Acquired: 09/23/2019 Comorbid Cataracts, Hypertension, Peripheral Venous Weeks Of Treatment: 7 History: Disease, Type II Diabetes, Gout, Received Clustered Wound: Yes Radiation Photos Wound Measurements Length: (cm) 2.2 % Reduct Width: (cm) 1.3 % Reduct Depth: (cm) 0.1 Epitheli Clustered Quantity: 1 Tunnelin Area: (cm) 2.246 Undermi Volume: (cm) 0.225 Wound Description Classification: Full Thickness Without Exposed Support Foul Od Structures  Slough/ Wound Flat and Intact Margin: Exudate Medium Amount: Exudate Serosanguineous Type: Exudate red, brown Color: Wound Bed Granulation Amount: Medium (34-66%) Granulation Quality: Pink Fascia E Necrotic Amount: Medium (34-66%) Fat Laye Necrotic Quality: Adherent Slough Tendon E Muscle E Joint Ex Bone Exp or After Cleansing: No Fibrino Yes Exposed Structure xposed: No r (Subcutaneous Tissue) Exposed: Yes xposed: No xposed: No posed: No osed: No ion in Area: 95.8% ion in Volume: 95.8% alization: Small (1-33%) g: No ning: No Treatment Notes Wound #19 (Left, Anterior Lower Leg) 1. Cleanse With Wound Cleanser Soap and water 3. Primary Dressing Applied Collegen AG 4. Secondary Dressing Dry Gauze 6. 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) Signed: 11/14/2019 4:25:10 PM By: Mikeal Hawthorne EMT/HBOT Signed: 11/15/2019 5:45:19 PM By:  [name] RN Previous Signature: 11/14/2019 11:16:04 AM Version By: Baruch Gouty RN, BSN Entered By: Mikeal Hawthorne on 11/14/2019 15:41:42 -------------------------------------------------------------------------------- Wound Assessment Details Patient Name: Date of Service: Daniel Pia D. 11/14/2019 8:00 AM Medical Record [id number] Patient Account Number: [id number] Date of Birth/Sex: Treating RN: 12/30/1940 (78 y.o. [name]) [name] Primary Care Waino Mounsey: [place]hel Party Other Clinician: Referring Carmen Tolliver: Treating Adrien Shankar/Extender:Robson, Cheryl Flash, [place] Weeks in Treatment: 37 Wound Status Wound Number: 20 Primary Diabetic Wound/Ulcer of the Lower Extremity Etiology: Wound Location: Right Toe Great Wound Open Wounding Event: Gradually Appeared Status: Date Acquired: 09/23/2019 Comorbid Cataracts, Hypertension, Peripheral Venous Weeks Of Treatment: 7 History: Disease, Type II Diabetes, Gout, Received Clustered Wound: No Radiation Photos Wound Measurements Length: (cm) 0.8 % Reduction Width: (cm) 0.2 % Reduction Depth: (cm) 0.1 Epitheliali Area: (cm) 0.126 Tunnelin Volume: (cm) 0.013 Undermin Wound Description Classification: Grade 2 Wound Margin: Distinct, outline attached Exudate Amount: Small Exudate Type: Serosanguineous Exudate Color: red, brown Wound Bed Granulation Amount: Large (67-100%) Granulation Quality: Pink Necrotic Amount: None Present (0%) Foul Odor After Cleansing: No Slough/Fibrino No Exposed Structure Fascia Exposed: No Fat Layer (Subcutaneous Tissue) Exposed: No Tendon Exposed: No Muscle Exposed: No Joint Exposed: No Bone Exposed: No Limited to Skin Breakdown in Area: 75.3% in Volume: 74.5% zation: Large (67-100%) g: No ing: No Treatment Notes Wound #20 (Right Toe Great) 1. Cleanse With Wound Cleanser 3. Primary Dressing Applied Calcium Alginate Ag 4. Secondary Dressing Dry Gauze Notes toe  netting Electronic Signature(s) Signed: 11/14/2019 4:25:10 PM By: Mikeal Hawthorne EMT/HBOT Signed: 11/15/2019 5:45:19 PM By: [name] RN Previous Signature: 11/14/2019 11:16:04 AM Version By: Baruch Gouty RN, BSN Entered By: Mikeal Hawthorne on 11/14/2019 15:42:58 -------------------------------------------------------------------------------- Wound Assessment Details Patient Name: Date of Service: Daniel Pia D. 11/14/2019 8:00 AM Medical Record [id number] Patient Account Number: [id number] Date of Birth/Sex: Treating RN: 03-18-1941 (78 y.o. [name]) Dolores Lory, Morey Hummingbird Primary Care Judyth Demarais: [place]hel Party Other Clinician: Referring Versie Fleener: Treating Jayelyn Barno/Extender:Robson, Cheryl Flash, [place] Weeks in Treatment: 37 Wound Status Wound Number: 21 Primary Diabetic Wound/Ulcer of the Lower Extremity Etiology: Wound Location: Right Toe Second Wound Open Wounding Event: Gradually Appeared Status: Date Acquired: 09/23/2019 Date Acquired: 09/23/2019 Comorbid Cataracts, Hypertension, Peripheral Venous Weeks Of Treatment: 7 History: Disease, Type II Diabetes, Gout, Received Clustered Wound: No Radiation Photos Wound Measurements Length: (cm) 1.4 % Reduction Width: (cm) 1 % Reduction Depth: (cm) 0.1 Epitheliali Area: (cm) 1.1 Tunneling: Volume: (cm) 0.11 Underminin Wound Description Classification: Grade 2 Wound Margin: Flat and Intact Exudate Amount: None Present Wound Bed Granulation Amount: None Present (0%) Necrotic Amount: Large (67-100%) Necrotic Quality: Eschar, Adherent Slough Foul Odor After Cleansing: No Slough/Fibrino Yes Exposed Structure Fascia Exposed: No Fat Layer (Subcutaneous Tissue) Exposed:  Yes Tendon Exposed: No Muscle Exposed: No Joint Exposed: No Bone Exposed: Yes in Area: -66.7% in Volume: -66.7% zation: None No g: No Treatment Notes Wound #21 (Right Toe Second) 1. Cleanse With Wound Cleanser 3. Primary Dressing Applied Calcium  Alginate Ag 4. Secondary Dressing Dry Gauze Notes toe netting Electronic Signature(s) Signed: 11/14/2019 4:25:10 PM By: Mikeal Hawthorne EMT/HBOT Signed: 11/15/2019 5:45:19 PM By: [name] RN Previous Signature: 11/14/2019 11:16:04 AM Version By: Baruch Gouty RN, BSN Entered By: Mikeal Hawthorne on 11/14/2019 15:42:38 -------------------------------------------------------------------------------- Wound Assessment Details Patient Name: Date of Service: Daniel Pia D. 11/14/2019 8:00 AM Medical Record [id number] Patient Account Number: [id number] Date of Birth/Sex: Treating RN: [birth year] (78 y.o. [name]) Dolores Lory, Morey Hummingbird Primary Care Jhordyn Hoopingarner: [place] Party Other Clinician: Referring Tequila Rottmann: Treating Luba Matzen/Extender:Robson, Cheryl Flash, [place] Weeks in Treatment: 37 Wound Status Wound Number: 24 Primary Venous Leg Ulcer Etiology: Wound Location: Left Lower Leg - Anterior, Distal Secondary Diabetic Wound/Ulcer of the Lower Wounding Event: Gradually Appeared Etiology: Extremity Date Acquired: 10/04/2019 Wound Open Weeks Of Treatment: 5 Status: Clustered Wound: No Comorbid Cataracts, Hypertension, Peripheral Venous History: Disease, Type II Diabetes, Gout, Received Radiation Photos Wound Measurements Length: (cm) 3.6 % Reduct Width: (cm) 1.5 % Reduct Depth: (cm) 0.1 Epitheli Area: (cm) 4.241 Tunneli Volume: (cm) 0.424 Undermi Wound Description Classification: Full Thickness Without Exposed Support Foul Od Structures Slough/ Wound Flat and Intact Margin: Exudate Medium Amount: Exudate Serosanguineous Type: Exudate red, brown Color: Wound Bed Granulation Amount: Large (67-100%) Granulation Quality: Red, Pink Fascia E Necrotic Amount: Small (1-33%) Fat Laye Necrotic Quality: Adherent Slough Tendon E Muscle E Joint Ex Bone Exp or After Cleansing: No Fibrino Yes Exposed Structure xposed: No r (Subcutaneous Tissue) Exposed: Yes xposed: No xposed:  No posed: No osed: No ion in Area: 33.5% ion in Volume: 33.5% alization: Small (1-33%) ng: No ning: No Treatment Notes Wound #24 (Left, Distal, Anterior Lower Leg) 1. Cleanse With Wound Cleanser Soap and water 3. Primary Dressing Applied Collegen AG 4. Secondary Dressing Dry Gauze 6. 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) Signed: 11/14/2019 4:25:10 PM By: Mikeal Hawthorne EMT/HBOT Signed: 11/15/2019 5:45:19 PM By: [name] RN Previous Signature: 11/14/2019 11:16:04 AM Version By: Baruch Gouty RN, BSN Entered By: Mikeal Hawthorne on 11/14/2019 15:36:44 -------------------------------------------------------------------------------- Wound Assessment Details Patient Name: Date of Service: Daniel Pia D. 11/14/2019 8:00 AM Medical Record [id number] Patient Account Number: [id number] Date of Birth/Sex: Treating RN: 02-01-1941 (78 y.o. [name]) [name] Primary Care Eustacio Ellen: Hurshel Party Other Clinician: Referring Darryl Blumenstein: Treating Celestine Prim/Extender:Robson, Cheryl Flash, [place] Weeks in Treatment: 37 Wound Status Wound Number: 25 Primary Venous Leg Ulcer Etiology: Wound Location: Left Lower Leg - Posterior, Distal Secondary Diabetic Wound/Ulcer of the Lower Wounding Event: Gradually Appeared Etiology: Extremity Date Acquired: 10/04/2019 Wound Open Weeks Of Treatment: 5 Status: Clustered Wound: Yes Comorbid Cataracts, Hypertension, Peripheral Venous History: Disease, Type II Diabetes, Gout, Received Radiation Photos Wound Measurements Length: (cm) 1.8 % Reduct Width: (cm) 1.8 % Reduct Depth: (cm) 0.1 Epitheli Clustered Quantity: 3 Tunnelin Area: (cm) 2.545 Undermi Volume: (cm) 0.254 Wound Description Classification: Full Thickness Without Exposed Support Foul Od Structures Slough/ Wound Flat and Intact Margin: Exudate Small Amount: Exudate Serosanguineous Type: Exudate red, brown Color: Wound  Bed Granulation Amount: Small (1-33%) Granulation Quality: Pink Fascia E Necrotic Amount: Large (67-100%) Fat Laye Necrotic Quality: Adherent Slough Tendon E Muscle E Joint Ex Bone Exp or After Cleansing: No Fibrino Yes Exposed Structure xposed: No r (Subcutaneous Tissue) Exposed: Yes xposed: No xposed:  No posed: No osed: No ion in Area: 89.7% ion in Volume: 89.7% alization: Small (1-33%) g: No ning: No Treatment Notes Wound #25 (Left, Distal, Posterior Lower Leg) 1. Cleanse With Wound Cleanser Soap and water 3. Primary Dressing Applied Collegen AG 4. Secondary Dressing Dry Gauze 6. 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) Signed: 11/14/2019 4:25:10 PM By: Mikeal Hawthorne EMT/HBOT Signed: 11/15/2019 5:45:19 PM By: [name] RN Previous Signature: 11/14/2019 11:16:04 AM Version By: Baruch Gouty RN, BSN Entered By: Mikeal Hawthorne on 11/14/2019 15:40:45 -------------------------------------------------------------------------------- Wound Assessment Details Patient Name: Date of Service: Daniel Pia D. 11/14/2019 8:00 AM Medical Record [id number] Patient Account Number: [id number] Date of Birth/Sex: Treating RN: 10-02-1940 (79 y.o. [name] Primary Care Thayden Lemire: [place] Party Other Clinician: Referring Epifanio Labrador: Treating Jamyah Folk/Extender:Robson, Cheryl Flash, [place] Weeks in Treatment: 37 Wound Status Wound Number: 26 Primary Venous Leg Ulcer Etiology: Wound Location: Left, Medial Lower Leg Secondary Diabetic Wound/Ulcer of the Lower Wounding Event: Gradually Appeared Etiology: Extremity Date Acquired: 10/04/2019 Wound Status: Healed - Epithelialized Weeks Of Treatment: 5 Clustered Wound: No Wound Measurements Length: (cm) 0 % Reduct Width: (cm) 0 % Reduct Depth: (cm) 0 Area: (cm) 0 Volume: (cm) 0 Wound Description Classification: Full Thickness Without Exposed Support Structures ion  in Area: 100% ion in Volume: 100% Electronic Signature(s) Signed: 11/14/2019 11:16:04 AM By: Baruch Gouty RN, BSN Entered By: Baruch Gouty on 11/14/2019 08:36:52 -------------------------------------------------------------------------------- Wound Assessment Details Patient Name: Date of Service: Daniel Pia D. 11/14/2019 8:00 AM Medical Record [id number] Patient Account Number: [id number] Date of Birth/Sex: Treating RN: 26-Jun-1941 (78 y.o. [name]) [name] Primary Care Electa Sterry: [place] Party Other Clinician: Referring Tayona Sarnowski: Treating Caedin Mogan/Extender:Robson, Cheryl Flash, [place] Weeks in Treatment: 37 Wound Status Wound Number: 28 Primary Diabetic Wound/Ulcer of the Lower Etiology: Extremity Wound Location: Left Lower Leg - Posterior, Proximal Secondary Venous Leg Ulcer Etiology: Wounding Event: Gradually Appeared Wound Open Date Acquired: 10/31/2019 Status: Weeks Of Treatment: 2 Comorbid Cataracts, Hypertension, Peripheral Venous Clustered Wound: No History: Disease, Type II Diabetes, Gout, Received Radiation Photos Wound Measurements Length: (cm) 1.2 Width: (cm) 0.5 Depth: (cm) 0.1 Area: (cm) 0.471 Volume: (cm) 0.047 Wound Description Classification: Grade 1 Wound Margin: Flat and Intact Exudate Amount: Small Exudate Type: Serosanguineous Exudate Color: red, brown Wound Bed Granulation Amount: Medium (34-66%) Granulation Quality: Pink Necrotic Amount: Medium (34-66%) Necrotic Quality: Adherent Slough ter Cleansing: No no No Exposed Structure d: No bcutaneous Tissue) Exposed: Yes d: No d: No : No No % Reduction in Area: 20% % Reduction in Volume: 20.3% Epithelialization: Medium (34-66%) Tunneling: No Undermining: No Foul Odor Af Slough/Fibri Fascia Expose Fat Layer (Su Tendon Expose Muscle Expose Joint Exposed Bone Exposed: Treatment Notes Wound #28 (Left, Proximal, Posterior Lower Leg) 1. Cleanse With Wound  Cleanser Soap and water 3. Primary Dressing Applied Collegen [employer] 4. Secondary Dressing Dry Gauze 6. 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) Signed: 11/14/2019 4:25:10 PM By: Mikeal Hawthorne EMT/HBOT Signed: 11/15/2019 5:45:19 PM By: [name] RN Previous Signature: 11/14/2019 11:16:04 AM Version By: Baruch Gouty RN, BSN Entered By: Mikeal Hawthorne on 11/14/2019 15:38:00 -------------------------------------------------------------------------------- Wound Assessment Details Patient Name: Date of Service: [name], [name] 11/14/2019 8:00 AM Medical Record [id number] Patient Account Number: [id number] Date of Birth/Sex: Treating RN: [birth year] (78 y.o. [name] Primary Care Zanaya Baize: [place] Party Other Clinician: Referring Marvelyn Bouchillon: Treating Malory Spurr/Extender:Robson, Cheryl Flash, [place] Weeks in Treatment: 37 Wound Status Wound Number: [id number] Primary Diabetic Wound/Ulcer of the Lower Extremity Etiology:  Wound Location: Right Calf - Posterior Wound Open Wounding Event: Gradually Appeared Status: Date Acquired: 02/28/2019 Comorbid Cataracts, Hypertension, Peripheral Venous Weeks Of Treatment: 37 History: Disease, Type II Diabetes, Gout, Received Clustered Wound: No Radiation Photos Wound Measurements Length: (cm) 2.5 % Reductio Width: (cm) 2.7 % Reductio Depth: (cm) 0.1 Epithelial Area: (cm) 5.301 Tunneling Volume: (cm) 0.53 Undermini Wound Description Classification: Grade 2 Foul Odor Wound Margin: Distinct, outline attached Slough/Fib Exudate Amount: None Present Wound Bed Granulation Amount: Large (67-100%) Granulation Quality: Red Fascia Exp Necrotic Amount: None Present (0%) Fat Layer Tendon Exp Muscle Exp Joint Expo Bone Expos Treatment Notes Wound #3R (Right, Posterior Calf) 1. Cleanse With Wound Cleanser Soap and water 3. Primary Dressing Applied Collegen [employer] 4. Secondary Dressing Dry  Gauze 6. 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) Signed: 11/14/2019 4:25:10 PM By: Mikeal Hawthorne EMT/HBOT Signed: 11/15/2019 5:45:19 PM By: [name] RN Previous Signature: 11/14/2019 11:16:04 AM Version By: Cy Blamer Entered By: Mikeal Hawthorne on 02/16/ After Cleansing: No rino No Exposed Structure osed: No (Subcutaneous Tissue) Exposed: Yes osed: No osed: No sed: No ed: No inda RN, BSN 2021 15:43:26 n in Area: 78.4% n in Volume: 78.4% ization: Medium (34-66%) : No ng: No -------------------------------------------------------------------------------- Vitals Details Patient Name: Date of Service: [name], [name] 11/14/2019 8:00 AM Medical Record [facility] Patient Account Number: [id number] Date of Birth/Sex: Treating RN: 01/13/1941 (79 y.o. [name] Primary Care Erandy Mceachern: [place] Party Other Clinician: Referring Pantelis Elgersma: Treating Erich Kochan/Extender:Robson, Cheryl Flash, [place] Weeks in Treatment: 37 Vital Signs Time Taken: 08:17 Temperature (F): 97.7 Height (in): 74 Pulse (bpm): 69 Source: Stated Respiratory Rate (breaths/min): 20 Weight (lbs): 212 Blood Pressure (mmHg): 93/61 Source: Stated Reference Range: 80 - 120 mg / dl Body Mass Index (BMI): 27.2 Electronic Signature(s) Signed: 11/14/2019 11:16:04 AM By: Baruch Gouty RN, BSN Entered By: Baruch Gouty on 11/14/2019 08:18:00

## 2019-11-16 ENCOUNTER — Ambulatory Visit: Payer: Medicare HMO | Admitting: Podiatry

## 2019-11-16 ENCOUNTER — Encounter: Payer: Self-pay | Admitting: Student

## 2019-11-20 ENCOUNTER — Other Ambulatory Visit: Payer: Self-pay

## 2019-11-20 ENCOUNTER — Other Ambulatory Visit (INDEPENDENT_AMBULATORY_CARE_PROVIDER_SITE_OTHER): Payer: Self-pay | Admitting: Internal Medicine

## 2019-11-20 ENCOUNTER — Ambulatory Visit: Payer: Medicare HMO | Admitting: Podiatry

## 2019-11-20 ENCOUNTER — Telehealth: Payer: Self-pay

## 2019-11-20 ENCOUNTER — Ambulatory Visit (INDEPENDENT_AMBULATORY_CARE_PROVIDER_SITE_OTHER): Payer: Medicare HMO

## 2019-11-20 DIAGNOSIS — L97519 Non-pressure chronic ulcer of other part of right foot with unspecified severity: Secondary | ICD-10-CM | POA: Diagnosis not present

## 2019-11-20 DIAGNOSIS — E0843 Diabetes mellitus due to underlying condition with diabetic autonomic (poly)neuropathy: Secondary | ICD-10-CM

## 2019-11-20 DIAGNOSIS — L97511 Non-pressure chronic ulcer of other part of right foot limited to breakdown of skin: Secondary | ICD-10-CM | POA: Diagnosis not present

## 2019-11-20 DIAGNOSIS — M79671 Pain in right foot: Secondary | ICD-10-CM | POA: Diagnosis not present

## 2019-11-20 DIAGNOSIS — L97529 Non-pressure chronic ulcer of other part of left foot with unspecified severity: Secondary | ICD-10-CM

## 2019-11-20 DIAGNOSIS — L97221 Non-pressure chronic ulcer of left calf limited to breakdown of skin: Secondary | ICD-10-CM | POA: Diagnosis not present

## 2019-11-20 DIAGNOSIS — L97211 Non-pressure chronic ulcer of right calf limited to breakdown of skin: Secondary | ICD-10-CM | POA: Diagnosis not present

## 2019-11-20 DIAGNOSIS — M79672 Pain in left foot: Secondary | ICD-10-CM | POA: Diagnosis not present

## 2019-11-20 DIAGNOSIS — L97524 Non-pressure chronic ulcer of other part of left foot with necrosis of bone: Secondary | ICD-10-CM | POA: Diagnosis not present

## 2019-11-20 NOTE — Telephone Encounter (Addendum)
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

## 2019-11-20 NOTE — Patient Instructions (Signed)
Pre-Operative Instructions  Congratulations, you have decided to take an important step towards improving your quality of life.  You can be assured that the doctors and staff at [facility] will be with you every step of the way.  Here are some important things you should know:  1. Plan to be at the surgery center/hospital at least 1 (one) hour prior to your scheduled time, unless otherwise directed by the surgical center/hospital staff.  You must have a responsible adult accompany you, remain during the surgery and drive you home.  Make sure you have directions to the surgical center/hospital to ensure you arrive on time. 2. If you are having surgery at [facility] or [facility], you will need a copy of your medical history and physical form from your family physician within one month prior to the date of surgery. We will give you a form for your primary physician to complete.  3. We make every effort to accommodate the date you request for surgery.  However, there are times where surgery dates or times have to be moved.  We will contact you as soon as possible if a change in schedule is required.   4. No aspirin/ibuprofen for one week before surgery.  If you are on aspirin, any non-steroidal anti-inflammatory medications (Mobic, Aleve, Ibuprofen) should not be taken seven (7) days prior to your surgery.  You make take Tylenol for pain prior to surgery.  5. Medications - If you are taking daily heart and blood pressure medications, seizure, reflux, allergy, asthma, anxiety, pain or diabetes medications, make sure you notify the surgery center/hospital before the day of surgery so they can tell you which medications you should take or avoid the day of surgery. 6. No food or drink after midnight the night before surgery unless directed otherwise by surgical center/hospital staff. 7. No alcoholic beverages 24-hours prior to surgery.  No smoking 24-hours prior or 24-hours after  surgery. 8. Wear loose pants or shorts. They should be loose enough to fit over bandages, boots, and casts. 9. Don't wear slip-on shoes. Sneakers are preferred. 10. Bring your boot with you to the surgery center/hospital.  Also bring crutches or a walker if your physician has prescribed it for you.  If you do not have this equipment, it will be provided for you after surgery. 11. If you have not been contacted by the surgery center/hospital by the day before your surgery, call to confirm the date and time of your surgery. 12. Leave-time from work may vary depending on the type of surgery you have.  Appropriate arrangements should be made prior to surgery with your employer. 13. Prescriptions will be provided immediately following surgery by your doctor.  Fill these as soon as possible after surgery and take the medication as directed. Pain medications will not be refilled on weekends and must be approved by the doctor. 14. Remove nail polish on the operative foot and avoid getting pedicures prior to surgery. 15. Wash the night before surgery.  The night before surgery wash the foot and leg well with water and the antibacterial soap provided. Be sure to pay special attention to beneath the toenails and in between the toes.  Wash for at least three (3) minutes. Rinse thoroughly with water and dry well with a towel.  Perform this wash unless told not to do so by your physician.  Enclosed: 1 Ice pack (please put in freezer the night before surgery)   1 Hibiclens skin cleaner     Pre-op instructions  If you have any questions regarding the instructions, please do not hesitate to call our office.  [facility]: [address], [place], [place] [address] -- [phone]  [facility] [place]: [address] [place], [place] [address] -- [phone]  [place]: [address], [place], [place] [address] -- [phone]   Website: [email or web address] 

## 2019-11-21 ENCOUNTER — Encounter (HOSPITAL_BASED_OUTPATIENT_CLINIC_OR_DEPARTMENT_OTHER): Payer: Medicare HMO | Admitting: Internal Medicine

## 2019-11-21 ENCOUNTER — Other Ambulatory Visit: Payer: Self-pay | Admitting: Podiatry

## 2019-11-21 DIAGNOSIS — I87333 Chronic venous hypertension (idiopathic) with ulcer and inflammation of bilateral lower extremity: Secondary | ICD-10-CM | POA: Diagnosis not present

## 2019-11-21 DIAGNOSIS — L97524 Non-pressure chronic ulcer of other part of left foot with necrosis of bone: Secondary | ICD-10-CM | POA: Diagnosis not present

## 2019-11-21 DIAGNOSIS — L97822 Non-pressure chronic ulcer of other part of left lower leg with fat layer exposed: Secondary | ICD-10-CM | POA: Diagnosis not present

## 2019-11-21 DIAGNOSIS — E11621 Type 2 diabetes mellitus with foot ulcer: Secondary | ICD-10-CM | POA: Diagnosis not present

## 2019-11-21 DIAGNOSIS — L97519 Non-pressure chronic ulcer of other part of right foot with unspecified severity: Secondary | ICD-10-CM

## 2019-11-21 DIAGNOSIS — L97529 Non-pressure chronic ulcer of other part of left foot with unspecified severity: Secondary | ICD-10-CM

## 2019-11-21 DIAGNOSIS — I890 Lymphedema, not elsewhere classified: Secondary | ICD-10-CM | POA: Diagnosis not present

## 2019-11-21 DIAGNOSIS — L03116 Cellulitis of left lower limb: Secondary | ICD-10-CM | POA: Diagnosis not present

## 2019-11-21 DIAGNOSIS — L97211 Non-pressure chronic ulcer of right calf limited to breakdown of skin: Secondary | ICD-10-CM | POA: Diagnosis not present

## 2019-11-21 DIAGNOSIS — S91101A Unspecified open wound of right great toe without damage to nail, initial encounter: Secondary | ICD-10-CM | POA: Diagnosis not present

## 2019-11-21 DIAGNOSIS — S91105A Unspecified open wound of left lesser toe(s) without damage to nail, initial encounter: Secondary | ICD-10-CM | POA: Diagnosis not present

## 2019-11-21 DIAGNOSIS — I110 Hypertensive heart disease with heart failure: Secondary | ICD-10-CM | POA: Diagnosis not present

## 2019-11-21 DIAGNOSIS — L97221 Non-pressure chronic ulcer of left calf limited to breakdown of skin: Secondary | ICD-10-CM | POA: Diagnosis not present

## 2019-11-21 DIAGNOSIS — I87312 Chronic venous hypertension (idiopathic) with ulcer of left lower extremity: Secondary | ICD-10-CM | POA: Diagnosis not present

## 2019-11-21 DIAGNOSIS — L97511 Non-pressure chronic ulcer of other part of right foot limited to breakdown of skin: Secondary | ICD-10-CM | POA: Diagnosis not present

## 2019-11-21 NOTE — Progress Notes (Signed)
Daniel, Gross ([id number]) Visit Report for 11/21/2019 HPI Details Patient Name: Date of Service: [name], [name] 11/21/2019 9:45 AM Medical Record [place] Patient Account Number: [id number] Date of Birth/Sex: Treating RN: [birth year] (78 y.o. [name]) [name] Primary Care Provider: Hurshel Gross Other Clinician: Referring Provider: Treating Provider/Extender:[name], [name], [name] in Treatment: 36 History of Present Illness Location: Patient presents with a wound to left lower leg. Quality: Patient reports No Pain. Duration: 2 months HPI Description: no cig or alcohol. spontaneous appearance in area of stasis dermamtitis. [name] on metformin only. chronic afib on Coumadin. diabetes and coag studies not good. hba1c 7.5. ivr 4.5. no pain or sxs of systemic disease. hx chf. no intermittent claudication 02/28/2019 Readmission This is a now a 79 year old man who was previously cared for in 2016 by Dr. Lindon Romp for wounds on his lower extremities. At that point he had venous reflux studies although I cannot seem to open these in [place] link. He had arterial studies showing an ABI of 1.11 on the right and 1.27 on the left his waveforms were triphasic bilaterally. He was discharged in stockings although I do not believe he is wearing these in some time. He tells me that about a month ago he noted openings of a large wound on the posterior right calf and 2 smaller areas on the left lateral calf and a small area more recently on the left posterior calf. He has been dressing these with peroxide and triple antibiotic ointment. He is not wearing compression. Past medical history; type 2 diabetes with peripheral neuropathy, chronic venous insufficiency, hypertension, cardiomyopathy, chronic atrial fibrillation on Coumadin, prostate cancer, hyperlipidemia, gout, ABI in our clinic was 1.34 on the left and not obtainable on the right 6/9; this is a patient who has chronic  venous insufficiency. He has a fairly substantial area on the right posterior calf, left lateral calf and a small area on the left posterior calf. On arrival last week he had very palpable popliteal and femoral pulses but nothing in his bilateral feet. Unfortunately we cannot get arterial studies until July 1 at Dr. Kennon Gross office. They live in [place]. We use silver alginate under Kerlix Coban 6/16; patient with chronic venous insufficiency with wounds on his bilateral lower extremities. When he came into our clinic he was discovered to have a complete absence of peripheral pedal pulses at either the dorsalis pedis or posterior tibial. He does have easily palpable femoral and popliteal pulses. He sees Dr. Gwenlyn Gross tomorrow for noninvasive arterial tests. He may also require venous reflux evaluation although I do not view this as an urgent thing. We have been using silver alginate. His wound surfaces of cleaned up quite nicely 6/23; patient with chronic venous insufficiency with wounds on his bilateral lower extremities. His wounds all are somewhat better looking. He did go to Dr. Kennon Gross office but somehow ended up on the doctors schedule rather than being scheduled for noninvasive tests therefore his noninvasive tests are scheduled for July 1. We agree that he has venous insufficiency ulcers but I cannot feel any pulses in his lower extremities dictating the need for test. We are only using Kerlix and light Coban unfortunately this appears to be holding the edema 6/30; has his arterial studies tomorrow. We have been using Kerlix and light Coban will go to a more aggressive compression if the arterial studies will allow. We all agreed these are venous wounds however I cannot feel pulses at either the dorsalis pedis or posterior tibial  bilaterally. His wounds generally look some better including left lateral and right posterior. 7/7-Patient returns at 1 week in Kerlix/Coban to both legs, with  improvement, in the left lateral and right posterior lower leg wounds, ABI's are normal in both legs per vascular studies, TBI is also normal on both sides, we are using hydrofera blue to the wounds 7/14; patient's arterial studies from 2 Gross ago showed an ABI on the right at 1.03 with a TBI of 0.86. On the left the ABI was 1.06 with a TBI of 0.84. Notable for the fact that his arterial waveforms were monophasic in all of the lower extremity arteries suggesting some degree of arterial occlusive disease but in general this was felt to be fairly adequate for healing. His compression was increased from 2-3 layer which is appropriate. Dressing was changed to [facility] 7/21; patient's wounds are measuring smaller. The more substantial one on the right posterior calf, second 1 on the left lateral calf. Using [facility] on both wound areas 7/28; patient continues to make nice improvements. The area on the right posterior calf is smaller. Area on the left lateral calf also is smaller. We have been using Hydrofera Blue under compression. The patient will need compression stockings and we have measured him for these in the eventuality that these heal which really should not be too long from now 8/4-Patient continues to make improvement, the right posterior calf area smaller with rim of keratotic skin on one side, the left wound is definitely smaller and improving. 8/11-Returns at 1 week, after being in 3 layer compression on both legs, both wounds appear to be improving, making good progress, patient is happy, pain is also less especially in the right leg wound 8/18; the area on the left anterior lower leg is healed. On the right posterior leg the wound remains although the dimensions are a lot better. 8/25; he arrives in clinic today with a large body of open wound on the left lateral calf. All of the 3 wounds in this area are in close juxtaposition to each other. The story is that we  discharged him last week with no a wrap on the left leg. They went to [facility] could not get in as they are only excepting phone orders or online orders for stockings hence they did not put any stocking on the left leg all week. They have something at home but the patient with that was either incapable or just did not put them on. Apparently these opened 1 morning after getting out of bed. The area on the right has no real change 9/1; patient has bilateral lower extremity wounds in the setting of severe chronic venous insufficiency and secondary lymphedema. He arrived last week with new areas on the left lateral lower leg after we did not wrap him and he did not use his stockings. Nevertheless the areas on the left look better today under compression. Posterior right calf does not really changed. We are using Hydrofera Blue on both areas under compression 9/15; bilateral lower extremity wounds in the setting of severe chronic venous insufficiency and secondary lymphedema. He has 20 to 30 mmHg below-knee compression stockings under the eventuality that these close over. We did get the left leg to close but he did not transition to a stocking and this reopened. There are 2 open areas on the left posterior lateral calf and one on the right. Both of these look satisfactory. Using [facility] 9/22; bilateral lower extremity wounds in the  setting of chronic venous insufficiency. 2 superficial areas on the left lateral calf. One on the right just above the Achilles area. We have good edema control we have been using Hydrofera Blue 9/29; the areas on the left lateral calf are healed. On the right just above the Achilles and tendon area things look a lot better small wounds one scabbed area. We have been using Hydrofera Blue. We can discharge him in his own stocking on the left still wrapping on the right. This is the second time we have healed the left leg but he did not put a stocking on last time.  Hopefully this will maintain the edema from chronic venous disease with secondary lymphedema 10/6; he comes in today having a stocking on the left leg. They had trouble getting it on there is a lot of increase in swelling 2 small open areas one anteriorly and one on the medial calf. They report a lot of difficulty getting the stocking on. Paradoxically the area on the right that we have been wrapping posteriorly is closed 10/13; he comes in today with wounds bilaterally including superficial areas on the left medial and left lateral calf. As well as the right posterior has reopened in the Achilles area superiorly. He still does not have his juxta lite stockings although truthfully we would not of been able to use them today anyway. Apparently have been ordered and paid for from [employer] although they have not been delivered 10/20; his area on the right is just the boat closed on the right posterior. Still has the area on the left lateral and a very tiny area on the left medial. He has his bilateral juxta lites although he is not ready for them this week. He tolerated the increase to 4 layer compression last week quite well 10/27; the area on the right posterior calf is once again closed. He has a superficial area on the left lateral calf that is still open. He has been using Hydrofera Blue and bilateral 4-layer compression. He can change to his own juxta lite stocking on the right and we are instructing him today 11/3; the area on the right posterior calf reopened according to the patient and his wife after they took off the stocking when they got home last week. Apparently scabbed over there is now a fairly substantial wound which looks pretty much the same. Our intake nurse noted that they were using the juxta lite stockings appropriately. I was really hoping I might be able to close him out today. He has 1 very tiny remaining area on the left lateral lower leg. 11/10; right posterior calf wound  measures smaller but is still open. We have been using Hydrofera Blue. On the left he has a small oval-shaped wound and he seems to have had another wound distally that is open and likely a blister. We are using Hydrofera Blue under compression 11/17; right posterior calf wound continues to get better. We have been using Hydrofera Blue. On the left lateral one of the wounds has closed still a small open area. We have been using Hydrofera Blue on this as well. Both areas have been under 4-layer compression Arrives in clinic today with some swelling in the dorsal foot on the right some erythema of his forefoot and toes. Initially when I looked at this I almost thought this was a sunburn distal to a wrap injury. 12/1; right posterior calf wound debrided with a curette. We have been using Hydrofera Blue on the  left anterior lateral he has an area across the mid tibia. Finally a small area on the left lateral lower calf. Finally he continues to have de-epithelialized areas on the dorsal aspect of his toes. Initially thought this might be a burn injury when I saw him 2 Gross ago. I now wonder about tinea. I have also reviewed his arterial studies which were really quite good in July/20 with normal TBI's and ABIs but monophasic waveforms 12/8; comes in today with worsening problems especially on the left leg where he now has a cluster of wounds in the left anterior mid tibia. Very poor edema control. I reduced him to 3 layer from 4 layer compression last week because of some concern about blood flow to his toes however he does not have good edema control on the left leg. Right leg edema control looks satisfactory. On the left he has a cluster of wounds anteriorly, small area on the left medial fifth met head and then the collection of areas on his toes which appear better On the right he has the original area on the right posterior calf, a new area right medially. His formal arterial studies from mid  July are noted below. He was evaluated by Dr.Berry ABI Findings: +---------+------------------+-----+----------+--------+ Right Rt Pressure (mmHg)IndexWaveform Comment  +---------+------------------+-----+----------+--------+ Brachial 176     +---------+------------------+-----+----------+--------+ ATA 176 0.99 monophasic  +---------+------------------+-----+----------+--------+ PTA 183 1.03 monophasic  +---------+------------------+-----+----------+--------+ PERO 172 0.97 monophasic  +---------+------------------+-----+----------+--------+ Great Toe153 0.86    +---------+------------------+-----+----------+--------+ +---------+------------------+-----+-----------+-------+ Left Lt Pressure (mmHg)IndexWaveform Comment +---------+------------------+-----+-----------+-------+ Brachial 178     +---------+------------------+-----+-----------+-------+ ATA 162 0.91 multiphasic  +---------+------------------+-----+-----------+-------+ PTA 188 1.06 multiphasic  +---------+------------------+-----+-----------+-------+ PERO 158 0.89 monophasic   +---------+------------------+-----+-----------+-------+ Great Toe150 0.84    +---------+------------------+-----+-----------+-------+ +-------+-----------+-----------+------------+------------+ ABI/TBIToday's ABIToday's TBIPrevious ABIPrevious TBI +-------+-----------+-----------+------------+------------+ Right 1.03 0.86 1.11   +-------+-----------+-----------+------------+------------+ Left 1.06 0.84 1.27   +-------+-----------+-----------+------------+------------+ Tibial waveforms somewhat difficult to record due to irregular heartbeat. Bilateral ABIs appear essentially unchanged compared to prior study on 06/21/15. Summary: Right: Resting right ankle-brachial index is within normal range. No evidence of significant right lower extremity arterial disease.  The right toe-brachial index is normal. Although ankle brachial indices are within normal limits (0.95-1.29), arterial Doppler waveforms at the ankle suggest some component of arterial occlusive disease. Left: Resting left ankle-brachial index is within normal range. No evidence of significant left lower extremity arterial disease. The left toe-brachial index is normal. Although ankle brachial indices are within normal limits (0.95-1.29), arterial Doppler waveforms at the ankle suggest some component of arterial occlusive disease. 12/15; the patient's area on the left mid tibia looks better. Right posterior calf also better. He has the area on the left foot as well. All of his toes look better I think this was tinea. We are using Hydrofera Blue everywhere else The patient was in urgent care yesterday with wheezing and shortness of breath. He got azithromycin and prednisone. He feels better. His lungs are currently clear to auscultation. He was not tested for Covid 19 12/29; the patient arrives in clinic today with quite a bit change. 2 Gross ago he only had areas on the left mid tibia right posterior calf with tinea pedis resolving between his toes. He arrives in clinic today with several areas on the dorsal toes on the right, dorsal left second toe. He has skin breakdown in the left medial calf probably from excess edema. Small area proximally in the medial calf. He has weeping edema fluid coming out of the skin excoriations on the left medial calf. He tells  me that he is having a cardiac catheterization next week. I had a quick look at [facility] health link. He was Gross to have an ejection fraction of 25% during the work-up for persistent atrial fibrillation. He saw his cardiology office yesterday seen by the nurse practitioner. She increased his carvedilol. He has not been on diuretics for apparently several months and indeed in the nurse practitioner Dietrich Pates notes she had knowledge of  this. 10/04/2019 on evaluation today patient presents as a walk-in visit concerning the fact that he did not have an appointment here for our clinic at this point. He actually had a cardiac catheterization earlier today and then came from there to here in order to be evaluated. With that being said unfortunately he is having significant cellulitis of his left lower extremity upon evaluation today this appears to have deteriorated even since last week's evaluation with Dr. Dellia Nims. The right lower extremity is actually doing okay I really see no evidence of deterioration at this point at those locations. In fact the right leg seems in general be doing quite well. Nonetheless I am concerned about infection and cellulitis of the left lower extremity and again considering his weakened heart I do not want him to develop into sepsis at all. He is also having some trouble breathing today and I understand according to nursing staff this is always the case to some degree. With that being said the patient unfortunately seems to be in my opinion a little bit worse even his wife feels like that may be the case today. Unsure exactly what is leading to this. Cardiac catheterization I did review the report which showed an ejection fraction of 25% he also had an LAD blockage of around 25% based on what I saw. With that being said there was no significant blockages that required stenting at this point he does have weakened cardiac muscles compared to normal. 10/05/2019; patient was seen yesterday in clinic. He was sent to the ER because of cellulitis of the left leg possibility. In the ER he was given 1 dose of IV Levaquin and discharged on Keflex. He came in the clinic initially for a nurse visit to rewrap his left leg. We did not look at the right leg today that is an Haematologist. The patient also had a cardiac cath. According to him there were no blockages but a very low ejection fraction. 1/12; back for an early  follow-up. The condition of the left lower leg is a lot better although there are multiple open areas. All of them with not a particularly viable surface. On the right posterior calf he has a single wound with a clean surface. He has a wound with exposed bone on the PIP of the left second toe dorsally. He has wounds on the dorsal right first second and third toes. His arterial studies were normal. 1/19; the patient has 3 open wounds on the left leg anteriorly in the mid tibia, distally and medially just above the ankle and posteriorly. On the right he has a small area dime sized on the right posterior calf. His edema control is a lot better. He still has wounds on the right second and left second toes. The left second toe has exposed bone. X- ray I did last week did not show evidence of osteomyelitis in the left foot. 1/26; patient with a multiplicity of wounds and problems. On the left he has a circular area on the left anterior mid tibia Left just above  the medical ankle -left 2nd toe pip -right 2nd toe right posterior calf 2/2; patient with a multiplicity of wounds and problems. He has severe chronic venous insufficiency and the wounds on his leg are all on the left left anterior left medial at the medial malleolus and left posterior. We have been using Iodoflex to these areas to help with ongoing debridement. He also has largely traumatic wounds on his toes this includes the left second with exposed bone. Bone culture I did last week showed Staph aureus which is methicillin sensitive. I discussed with him today the idea of an amputation of this toe because it is literally nonfunctional however he wants to try antibiotics. Antibiotic choice is complicated by the fact that he is on Coumadin. He also has wounds over the dorsal part of the right first which is close to closed. Right second toe has exposed bone and the right third toe at the base of the right third toe is just about closed as  well. We have been using silver alginate 2/9; patient with severe chronic venous insufficiency. He has large wounds on the left anterior mid tibia and on the left medial just above the ankle. I am concerned today about the depth of the area on the left mid tibia may be exposing into the muscle. He has small areas on the left posterior calf and on the right posterior calf although these do not look as threatening. He also has traumatic wounds on his toes. The right first and third are closed however the bilateral second toes have exposed bone. The area on the left is open into the distal interphalangeal joint. In my opinion these toes are nonsalvageable and will need amputation 2/16; patient with severe chronic venous insufficiency. He has wounds on the left anterior mid tibia left medial lower leg just above the ankle. He has small areas on the left posterior calf and a more prominent area on the right posterior calf. All of these are related to poorly controlled venous hypertension. He also had traumatic wounds on the PIPs of both second toes. Unfortunately both of these have exposed bone and in the case of the left this I think goes right into the joint itself. I do not think either 1 of these is salvageable. I have reviewed his arterial evaluation that was done in July last year. He also saw Dr. Gwenlyn Gross in June. He felt these were venous stasis. Previously had segmental arterial pressures performed in 11/03/2014 which were normal. He was sent for for a follow-up arterial evaluation on July 1. On the right his ABIs were 1.03 with a TBI of 0.86 although his waveforms were monophasic on the left he had multiphasic waveforms at the ATA PTA but monophasic at the peroneal nevertheless his ABI was normal at 1.06 TBI also normal at 0.84. I think he has enough blood flow to heal an amputation which I think he needs of the left second toe and probably the right second toe as well 2/23; the patient is going  for his bilateral second toe amputation by Dr. Amalia Hailey on Friday. In the meantime is 3 areas on the left leg and the small area on the right posterior look better. We have been using silver collagen. Electronic Signature(s) Signed: 11/21/2019 6:00:34 PM By: [name] MD Entered By: [name] on 11/21/2019 11:08:39 -------------------------------------------------------------------------------- Physical Exam Details Patient Name: Date of Service: TANYA, MARVIN 11/21/2019 9:45 AM Medical Record [id number] Patient Account Number: [id number] Date of Birth/Sex: Treating  RN: Feb 12, 1941 (79 y.o. [name]) [name] Primary Care Provider: Hurshel Gross Other Clinician: Referring Provider: Treating Provider/Extender:Nyisha Clippard, [name], [place] Gross in Treatment: 59 Constitutional Sitting or standing Blood Pressure is within target range for patient.. Pulse regular and within target range for patient.Marland Kitchen Respirations regular, non-labored and within target range.. Temperature is normal and within the target range for the patient.Marland Kitchen Appears in no distress. Cardiovascular Pedal pulses are palpable. Notes Wound exam; left anterior leg wounds are debrided with Anasept and gauze. All of these look quite good this includes the left anterior tibia, medial calf just above the ankle and the small area on the posterior. The area on the right posterior appears smaller. No mechanical debridement is required of any of these areas and there is no evidence of infection. Electronic Signature(s) Signed: 11/21/2019 6:00:34 PM By: [name] MD Entered By: [name] on 11/21/2019 11:10:42 -------------------------------------------------------------------------------- Physician Orders Details Patient Name: Date of Service: TOBY, BREITHAUPT 11/21/2019 9:45 AM Medical Record [id number] Patient Account Number: [id number] Date of Birth/Sex: Treating RN: 09/02/1941 (78 y.o. [name])  [name] Primary Care Provider: Hurshel Gross Other Clinician: Referring Provider: Treating Provider/Extender:Jeramia Saleeby, [name], [place] Gross in Treatment: 31 Verbal / Phone Orders: No Diagnosis Coding ICD-10 Coding Code Description L97.211 Non-pressure chronic ulcer of right calf limited to breakdown of skin L97.221 Non-pressure chronic ulcer of left calf limited to breakdown of skin I87.333 Chronic venous hypertension (idiopathic) with ulcer and inflammation of bilateral lower extremity E11.51 Type 2 diabetes mellitus with diabetic peripheral angiopathy without gangrene E11.42 Type 2 diabetes mellitus with diabetic polyneuropathy L97.524 Non-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 - Tuesday **********EXTRA TIME**************** Dressing Change Frequency Wound #13 Left Toe Second Change dressing every day. Wound #19 Left,Anterior Lower Leg Do not change entire dressing for one week. Wound #20 Right Toe Great Change dressing every day. Wound #21 Right Toe Second Change dressing every day. Wound #24 Left,Distal,Anterior Lower Leg Do not change entire dressing for one week. Wound #25 Left,Distal,Posterior Lower Leg Do not change entire dressing for one week. Wound #3R Right,Posterior Calf Do not change entire dressing for one week. Skin Barriers/Peri-Wound Care TCA Cream or Ointment Wound Cleansing May shower with protection. Primary Wound Dressing Wound #13 Left Toe Second Calcium Alginate with Silver Wound #19 Left,Anterior Lower Leg Silver Collagen Wound #20 Right Toe Great Calcium Alginate with Silver Wound #21 Right Toe Second Calcium Alginate with Silver Wound #24  Left,Distal,Anterior Lower Leg Silver Collagen Wound #25 Left,Distal,Posterior Lower Leg Silver Collagen Wound #3R Right,Posterior Calf Silver Collagen Secondary Dressing Dry Gauze - secure toes with tape Edema Control Unna Boots Bilaterally Off-Loading Open toe surgical shoe to: - left and right foot Electronic Signature(s) Signed: 11/21/2019 6:00:34 PM By: [name] MD Signed: 11/21/2019 6:06:02 PM By: Daniel Coria RN Entered By: [name] on 11/21/2019 11:04:56 -------------------------------------------------------------------------------- Problem List Details Patient Name: Date of Service: Daniel Pia D. 11/21/2019 9:45 AM Medical Record [id number] Patient Account Number: [id number] Date of Birth/Sex: Treating RN: 1941/05/20 (78 y.o. [name] Primary Care Provider: Hurshel Gross Other Clinician: Referring Provider: Treating Provider/Extender:Myrtis Maille, [name], Driscoll Gross in Treatment: 30 Active Problems ICD-10 Evaluated Encounter Code Description Active Date Today Diagnosis L97.211 Non-pressure chronic ulcer of right calf limited to 02/28/2019 No  Yes breakdown of skin L97.221 Non-pressure chronic ulcer of left calf limited to 02/28/2019 No Yes breakdown of skin I87.333 Chronic venous hypertension (idiopathic) with ulcer 02/28/2019 No Yes and inflammation of bilateral lower extremity E11.51 Type 2 diabetes mellitus with diabetic peripheral 02/28/2019 No Yes angiopathy without gangrene E11.42 Type 2 diabetes mellitus with diabetic polyneuropathy 02/28/2019 No Yes L97.524 Non-pressure chronic ulcer of other part of left foot 10/10/2019 No Yes with necrosis of bone L97.511 Non-pressure chronic ulcer of other part of right foot 09/26/2019 No Yes limited to breakdown of skin D17.61 Chronic systolic (congestive) heart failure 10/05/2019 No Yes M86.672 Other chronic osteomyelitis, left ankle and foot 10/31/2019 No Yes Inactive Problems ICD-10 Code  Description Active Date Inactive Date B35.3 Tinea pedis 09/05/2019 09/05/2019 L97.521 Non-pressure chronic ulcer of other part of left foot limited to 09/26/2019 09/26/2019 breakdown of skin Resolved Problems Electronic Signature(s) Signed: 11/21/2019 6:00:34 PM By: [name] MD Entered By: [name] on 11/21/2019 11:07:46 -------------------------------------------------------------------------------- Progress Note Details Patient Name: Date of Service: Daniel Pia D. 11/21/2019 9:45 AM Medical Record [id number] Patient Account Number: [id number] Date of Birth/Sex: Treating RN: 01-01-1941 (78 y.o. [name]) [name] Primary Care Provider: Hurshel Gross Other Clinician: Referring Provider: Treating Provider/Extender:Amantha Sklar, [name], Hartley Gross in Treatment: 38 Subjective History of Present Illness (HPI) The following HPI elements were documented for the patient's wound: Location: Patient presents with a wound to left lower leg. Quality: Patient reports No Pain. Duration: 2 months no cig or alcohol. spontaneous appearance in area of stasis dermamtitis. [name] on metformin only. chronic afib on Coumadin. diabetes and coag studies not good. hba1c 7.5. ivr 4.5. no pain or sxs of systemic disease. hx chf. no intermittent claudication 02/28/2019 Readmission This is a now a 79 year old man who was previously cared for in 2016 by Dr. Lindon Romp for wounds on his lower extremities. At that point he had venous reflux studies although I cannot seem to open these in [place] link. He had arterial studies showing an ABI of 1.11 on the right and 1.27 on the left his waveforms were triphasic bilaterally. He was discharged in stockings although I do not believe he is wearing these in some time. He tells me that about a month ago he noted openings of a large wound on the posterior right calf and 2 smaller areas on the left lateral calf and a small area more recently on the left  posterior calf. He has been dressing these with peroxide and triple antibiotic ointment. He is not wearing compression. Past medical history; type 2 diabetes with peripheral neuropathy, chronic venous insufficiency, hypertension, cardiomyopathy, chronic atrial fibrillation on Coumadin, prostate cancer, hyperlipidemia, gout, ABI in our clinic was 1.34 on the left and not obtainable on the right 6/9; this is a patient who has chronic venous insufficiency. He has a fairly substantial area on the right posterior calf, left lateral calf and a small area on the left posterior calf. On arrival last week he had very palpable popliteal and femoral pulses but nothing in his bilateral feet. Unfortunately we cannot get arterial studies until July 1 at Dr. Kennon Gross office. They live in [place]. We use silver alginate under Kerlix Coban 6/16; patient with chronic venous insufficiency with wounds on his bilateral lower extremities. When he came into our clinic he was discovered to have a complete absence of peripheral pedal pulses at either the dorsalis pedis or posterior tibial. He does have easily palpable femoral and popliteal pulses. He sees Dr. Gwenlyn Gross tomorrow for noninvasive  arterial tests. He may also require venous reflux evaluation although I do not view this as an urgent thing. We have been using silver alginate. His wound surfaces of cleaned up quite nicely 6/23; patient with chronic venous insufficiency with wounds on his bilateral lower extremities. His wounds all are somewhat better looking. He did go to Dr. Kennon Gross office but somehow ended up on the doctors schedule rather than being scheduled for noninvasive tests therefore his noninvasive tests are scheduled for July 1. We agree that he has venous insufficiency ulcers but I cannot feel any pulses in his lower extremities dictating the need for test. We are only using Kerlix and light Coban unfortunately this appears to be holding the  edema 6/30; has his arterial studies tomorrow. We have been using Kerlix and light Coban will go to a more aggressive compression if the arterial studies will allow. We all agreed these are venous wounds however I cannot feel pulses at either the dorsalis pedis or posterior tibial bilaterally. His wounds generally look some better including left lateral and right posterior. 7/7-Patient returns at 1 week in Kerlix/Coban to both legs, with improvement, in the left lateral and right posterior lower leg wounds, ABI's are normal in both legs per vascular studies, TBI is also normal on both sides, we are using hydrofera blue to the wounds 7/14; patient's arterial studies from 2 Gross ago showed an ABI on the right at 1.03 with a TBI of 0.86. On the left the ABI was 1.06 with a TBI of 0.84. Notable for the fact that his arterial waveforms were monophasic in all of the lower extremity arteries suggesting some degree of arterial occlusive disease but in general this was felt to be fairly adequate for healing. His compression was increased from 2-3 layer which is appropriate. Dressing was changed to [facility] 7/21; patient's wounds are measuring smaller. The more substantial one on the right posterior calf, second 1 on the left lateral calf. Using [facility] on both wound areas 7/28; patient continues to make nice improvements. The area on the right posterior calf is smaller. Area on the left lateral calf also is smaller. We have been using Hydrofera Blue under compression. The patient will need compression stockings and we have measured him for these in the eventuality that these heal which really should not be too long from now 8/4-Patient continues to make improvement, the right posterior calf area smaller with rim of keratotic skin on one side, the left wound is definitely smaller and improving. 8/11-Returns at 1 week, after being in 3 layer compression on both legs, both wounds appear to be  improving, making good progress, patient is happy, pain is also less especially in the right leg wound 8/18; the area on the left anterior lower leg is healed. On the right posterior leg the wound remains although the dimensions are a lot better. 8/25; he arrives in clinic today with a large body of open wound on the left lateral calf. All of the 3 wounds in this area are in close juxtaposition to each other. The story is that we discharged him last week with no a wrap on the left leg. They went to [place] could not get in as they are only excepting phone orders or online orders for stockings hence they did not put any stocking on the left leg all week. They have something at home but the patient with that was either incapable or just did not put them on. Apparently these opened  1 morning after getting out of bed. The area on the right has no real change 9/1; patient has bilateral lower extremity wounds in the setting of severe chronic venous insufficiency and secondary lymphedema. He arrived last week with new areas on the left lateral lower leg after we did not wrap him and he did not use his stockings. Nevertheless the areas on the left look better today under compression. Posterior right calf does not really changed. We are using Hydrofera Blue on both areas under compression 9/15; bilateral lower extremity wounds in the setting of severe chronic venous insufficiency and secondary lymphedema. He has 20 to 30 mmHg below-knee compression stockings under the eventuality that these close over. We did get the left leg to close but he did not transition to a stocking and this reopened. There are 2 open areas on the left posterior lateral calf and one on the right. Both of these look satisfactory. Using [facility] 9/22; bilateral lower extremity wounds in the setting of chronic venous insufficiency. 2 superficial areas on the left lateral calf. One on the right just above the Achilles area. We  have good edema control we have been using Hydrofera Blue 9/29; the areas on the left lateral calf are healed. On the right just above the Achilles and tendon area things look a lot better small wounds one scabbed area. We have been using Hydrofera Blue. We can discharge him in his own stocking on the left still wrapping on the right. This is the second time we have healed the left leg but he did not put a stocking on last time. Hopefully this will maintain the edema from chronic venous disease with secondary lymphedema 10/6; he comes in today having a stocking on the left leg. They had trouble getting it on there is a lot of increase in swelling 2 small open areas one anteriorly and one on the medial calf. They report a lot of difficulty getting the stocking on. ooParadoxically the area on the right that we have been wrapping posteriorly is closed 10/13; he comes in today with wounds bilaterally including superficial areas on the left medial and left lateral calf. As well as the right posterior has reopened in the Achilles area superiorly. He still does not have his juxta lite stockings although truthfully we would not of been able to use them today anyway. Apparently have been ordered and paid for from [employer] although they have not been delivered 10/20; his area on the right is just the boat closed on the right posterior. Still has the area on the left lateral and a very tiny area on the left medial. He has his bilateral juxta lites although he is not ready for them this week. He tolerated the increase to 4 layer compression last week quite well 10/27; the area on the right posterior calf is once again closed. He has a superficial area on the left lateral calf that is still open. He has been using Hydrofera Blue and bilateral 4-layer compression. He can change to his own juxta lite stocking on the right and we are instructing him today 11/3; the area on the right posterior calf reopened  according to the patient and his wife after they took off the stocking when they got home last week. Apparently scabbed over there is now a fairly substantial wound which looks pretty much the same. Our intake nurse noted that they were using the juxta lite stockings appropriately. I was really hoping I might be able  to close him out today. He has 1 very tiny remaining area on the left lateral lower leg. 11/10; right posterior calf wound measures smaller but is still open. We have been using Hydrofera Blue. On the left he has a small oval-shaped wound and he seems to have had another wound distally that is open and likely a blister. We are using Hydrofera Blue under compression 11/17; right posterior calf wound continues to get better. We have been using Hydrofera Blue. On the left lateral one of the wounds has closed still a small open area. We have been using Hydrofera Blue on this as well. Both areas have been under 4-layer compression Arrives in clinic today with some swelling in the dorsal foot on the right some erythema of his forefoot and toes. Initially when I looked at this I almost thought this was a sunburn distal to a wrap injury. 12/1; right posterior calf wound debrided with a curette. We have been using Hydrofera Blue on the left anterior lateral he has an area across the mid tibia. Finally a small area on the left lateral lower calf. Finally he continues to have de-epithelialized areas on the dorsal aspect of his toes. Initially thought this might be a burn injury when I saw him 2 Gross ago. I now wonder about tinea. I have also reviewed his arterial studies which were really quite good in July/20 with normal TBI's and ABIs but monophasic waveforms 12/8; comes in today with worsening problems especially on the left leg where he now has a cluster of wounds in the left anterior mid tibia. Very poor edema control. I reduced him to 3 layer from 4 layer compression last week because of  some concern about blood flow to his toes however he does not have good edema control on the left leg. Right leg edema control looks satisfactory. ooOn the left he has a cluster of wounds anteriorly, small area on the left medial fifth met head and then the collection of areas on his toes which appear better ooOn the right he has the original area on the right posterior calf, a new area right medially. His formal arterial studies from mid July are noted below. He was evaluated by Dr.Berry ABI Findings: +---------+------------------+-----+----------+--------+ Right Rt Pressure (mmHg)IndexWaveform Comment  +---------+------------------+-----+----------+--------+ Brachial 176     +---------+------------------+-----+----------+--------+ ATA 176 0.99 monophasic  +---------+------------------+-----+----------+--------+ PTA 183 1.03 monophasic  +---------+------------------+-----+----------+--------+ PERO 172 0.97 monophasic  +---------+------------------+-----+----------+--------+ Great Toe153 0.86    +---------+------------------+-----+----------+--------+ +---------+------------------+-----+-----------+-------+ Left Lt Pressure (mmHg)IndexWaveform Comment +---------+------------------+-----+-----------+-------+ Brachial 178     +---------+------------------+-----+-----------+-------+ ATA 162 0.91 multiphasic  +---------+------------------+-----+-----------+-------+ PTA 188 1.06 multiphasic  +---------+------------------+-----+-----------+-------+ PERO 158 0.89 monophasic   +---------+------------------+-----+-----------+-------+ Great Toe150 0.84    +---------+------------------+-----+-----------+-------+ +-------+-----------+-----------+------------+------------+ ABI/TBIToday's ABIToday's TBIPrevious ABIPrevious TBI +-------+-----------+-----------+------------+------------+ Right 1.03 0.86 1.11    +-------+-----------+-----------+------------+------------+ Left 1.06 0.84 1.27   +-------+-----------+-----------+------------+------------+ Tibial waveforms somewhat difficult to record due to irregular heartbeat. Bilateral ABIs appear essentially unchanged compared to prior study on 06/21/15. Summary: Right: Resting right ankle-brachial index is within normal range. No evidence of significant right lower extremity arterial disease. The right toe-brachial index is normal. Although ankle brachial indices are within normal limits (0.95-1.29), arterial Doppler waveforms at the ankle suggest some component of arterial occlusive disease. Left: Resting left ankle-brachial index is within normal range. No evidence of significant left lower extremity arterial disease. The left toe-brachial index is normal. Although ankle brachial indices are within normal limits (0.95-1.29), arterial Doppler waveforms at the ankle suggest some component of arterial occlusive disease.  12/15; the patient's area on the left mid tibia looks better. Right posterior calf also better. He has the area on the left foot as well. All of his toes look better I think this was tinea. We are using Hydrofera Blue everywhere else The patient was in urgent care yesterday with wheezing and shortness of breath. He got azithromycin and prednisone. He feels better. His lungs are currently clear to auscultation. He was not tested for Covid 19 12/29; the patient arrives in clinic today with quite a bit change. 2 Gross ago he only had areas on the left mid tibia right posterior calf with tinea pedis resolving between his toes. He arrives in clinic today with several areas on the dorsal toes on the right, dorsal left second toe. He has skin breakdown in the left medial calf probably from excess edema. Small area proximally in the medial calf. He has weeping edema fluid coming out of the skin excoriations on the left medial calf. He  tells me that he is having a cardiac catheterization next week. I had a quick look at [facility] health link. He was Gross to have an ejection fraction of 25% during the work-up for persistent atrial fibrillation. He saw his cardiology office yesterday seen by the nurse practitioner. She increased his carvedilol. He has not been on diuretics for apparently several months and indeed in the nurse practitioner Dietrich Pates notes she had knowledge of this. 10/04/2019 on evaluation today patient presents as a walk-in visit concerning the fact that he did not have an appointment here for our clinic at this point. He actually had a cardiac catheterization earlier today and then came from there to here in order to be evaluated. With that being said unfortunately he is having significant cellulitis of his left lower extremity upon evaluation today this appears to have deteriorated even since last week's evaluation with Dr. Dellia Nims. The right lower extremity is actually doing okay I really see no evidence of deterioration at this point at those locations. In fact the right leg seems in general be doing quite well. Nonetheless I am concerned about infection and cellulitis of the left lower extremity and again considering his weakened heart I do not want him to develop into sepsis at all. He is also having some trouble breathing today and I understand according to nursing staff this is always the case to some degree. With that being said the patient unfortunately seems to be in my opinion a little bit worse even his wife feels like that may be the case today. Unsure exactly what is leading to this. Cardiac catheterization I did review the report which showed an ejection fraction of 25% he also had an LAD blockage of around 25% based on what I saw. With that being said there was no significant blockages that required stenting at this point he does have weakened cardiac muscles compared to normal. 10/05/2019; patient was  seen yesterday in clinic. He was sent to the ER because of cellulitis of the left leg possibility. In the ER he was given 1 dose of IV Levaquin and discharged on Keflex. He came in the clinic initially for a nurse visit to rewrap his left leg. We did not look at the right leg today that is an Haematologist. The patient also had a cardiac cath. According to him there were no blockages but a very low ejection fraction. 1/12; back for an early follow-up. The condition of the left lower leg is a lot better  although there are multiple open areas. All of them with not a particularly viable surface. On the right posterior calf he has a single wound with a clean surface. He has a wound with exposed bone on the PIP of the left second toe dorsally. He has wounds on the dorsal right first second and third toes. His arterial studies were normal. 1/19; the patient has 3 open wounds on the left leg anteriorly in the mid tibia, distally and medially just above the ankle and posteriorly. On the right he has a small area dime sized on the right posterior calf. His edema control is a lot better. He still has wounds on the right second and left second toes. The left second toe has exposed bone. X- ray I did last week did not show evidence of osteomyelitis in the left foot. 1/26; patient with a multiplicity of wounds and problems. ooOn the left he has a circular area on the left anterior mid tibia ooLeft just above the medical ankle -left 2nd toe pip -right 2nd toe right posterior calf 2/2; patient with a multiplicity of wounds and problems. He has severe chronic venous insufficiency and the wounds on his leg are all on the left left anterior left medial at the medial malleolus and left posterior. We have been using Iodoflex to these areas to help with ongoing debridement. He also has largely traumatic wounds on his toes this includes the left second with exposed bone. Bone culture I did last week showed Staph  aureus which is methicillin sensitive. I discussed with him today the idea of an amputation of this toe because it is literally nonfunctional however he wants to try antibiotics. Antibiotic choice is complicated by the fact that he is on Coumadin. He also has wounds over the dorsal part of the right first which is close to closed. Right second toe has exposed bone and the right third toe at the base of the right third toe is just about closed as well. We have been using silver alginate 2/9; patient with severe chronic venous insufficiency. He has large wounds on the left anterior mid tibia and on the left medial just above the ankle. I am concerned today about the depth of the area on the left mid tibia may be exposing into the muscle. He has small areas on the left posterior calf and on the right posterior calf although these do not look as threatening. He also has traumatic wounds on his toes. The right first and third are closed however the bilateral second toes have exposed bone. The area on the left is open into the distal interphalangeal joint. In my opinion these toes are nonsalvageable and will need amputation 2/16; patient with severe chronic venous insufficiency. He has wounds on the left anterior mid tibia left medial lower leg just above the ankle. He has small areas on the left posterior calf and a more prominent area on the right posterior calf. All of these are related to poorly controlled venous hypertension. He also had traumatic wounds on the PIPs of both second toes. Unfortunately both of these have exposed bone and in the case of the left this I think goes right into the joint itself. I do not think either 1 of these is salvageable. I have reviewed his arterial evaluation that was done in July last year. He also saw Dr. Gwenlyn Gross in June. He felt these were venous stasis. Previously had segmental arterial pressures performed in 11/03/2014 which were normal. He was  sent for for a  follow-up arterial evaluation on July 1. On the right his ABIs were 1.03 with a TBI of 0.86 although his waveforms were monophasic on the left he had multiphasic waveforms at the ATA PTA but monophasic at the peroneal nevertheless his ABI was normal at 1.06 TBI also normal at 0.84. I think he has enough blood flow to heal an amputation which I think he needs of the left second toe and probably the right second toe as well 2/23; the patient is going for his bilateral second toe amputation by Dr. Amalia Hailey on Friday. In the meantime is 3 areas on the left leg and the small area on the right posterior look better. We have been using silver collagen. Objective Constitutional Sitting or standing Blood Pressure is within target range for patient.. Pulse regular and within target range for patient.Marland Kitchen Respirations regular, non-labored and within target range.. Temperature is normal and within the target range for the patient.Marland Kitchen Appears in no distress. Vitals Time Taken: 10:05 AM, Height: 74 in, Weight: 212 lbs, BMI: 27.2, Temperature: 97.7 F, Pulse: 87 bpm, Respiratory Rate: 20 breaths/min, Blood Pressure: 108/52 mmHg. Cardiovascular Pedal pulses are palpable. General Notes: Wound exam; left anterior leg wounds are debrided with Anasept and gauze. All of these look quite good this includes the left anterior tibia, medial calf just above the ankle and the small area on the posterior. The area on the right posterior appears smaller. No mechanical debridement is required of any of these areas and there is no evidence of infection. Integumentary (Hair, Skin) Wound #13 status is Open. Original cause of wound was Gradually Appeared. The wound is located on the Left Toe Second. The wound measures 1.6cm length x 2cm width x 0.2cm depth; 2.513cm^2 area and 0.503cm^3 volume. There is bone, joint, and Fat Layer (Subcutaneous Tissue) Exposed exposed. There is no tunneling or undermining noted. There is a small amount  of serosanguineous drainage noted. The wound margin is distinct with the outline attached to the wound base. There is no granulation within the wound bed. There is a large (67-100%) amount of necrotic tissue within the wound bed including Eschar and Adherent Slough. Wound #19 status is Open. Original cause of wound was Gradually Appeared. The wound is located on the Left,Anterior Lower Leg. The wound measures 1.7cm length x 1.2cm width x 0.2cm depth; 1.602cm^2 area and 0.32cm^3 volume. There is Fat Layer (Subcutaneous Tissue) Exposed exposed. There is no tunneling or undermining noted. There is a medium amount of serosanguineous drainage noted. The wound margin is flat and intact. There is large (67-100%) pink granulation within the wound bed. There is a small (1-33%) amount of necrotic tissue within the wound bed including Adherent Slough. Wound #20 status is Open. Original cause of wound was Gradually Appeared. The wound is located on the Right Toe Great. The wound measures 0.5cm length x 0.3cm width x 0.1cm depth; 0.118cm^2 area and 0.012cm^3 volume. There is Fat Layer (Subcutaneous Tissue) Exposed exposed. There is no tunneling or undermining noted. There is a small amount of serosanguineous drainage noted. The wound margin is distinct with the outline attached to the wound base. There is small (1-33%) pink granulation within the wound bed. There is a large (67-100%) amount of necrotic tissue within the wound bed including Adherent Slough. Wound #21 status is Open. Original cause of wound was Gradually Appeared. The wound is located on the Right Toe Second. The wound measures 1.5cm length x 1.5cm width x 0.1cm depth; 1.767cm^2  area and 0.177cm^3 volume. There is bone and Fat Layer (Subcutaneous Tissue) Exposed exposed. There is no tunneling or undermining noted. There is a medium amount of serosanguineous drainage noted. The wound margin is flat and intact. There is no granulation within the  wound bed. There is a large (67-100%) amount of necrotic tissue within the wound bed including Eschar and Adherent Slough. Wound #24 status is Open. Original cause of wound was Gradually Appeared. The wound is located on the [facility] Lower Leg. The wound measures 3.6cm length x 1.6cm width x 0.1cm depth; 4.524cm^2 area and 0.452cm^3 volume. There is Fat Layer (Subcutaneous Tissue) Exposed exposed. There is no tunneling or undermining noted. There is a medium amount of serosanguineous drainage noted. The wound margin is flat and intact. There is large (67-100%) red granulation within the wound bed. There is a small (1-33%) amount of necrotic tissue within the wound bed including Eschar and Adherent Slough. Wound #25 status is Open. Original cause of wound was Gradually Appeared. The wound is located on the Left,Distal,Posterior Lower Leg. The wound measures 1.7cm length x 1.7cm width x 0.1cm depth; 2.27cm^2 area and 0.227cm^3 volume. There is Fat Layer (Subcutaneous Tissue) Exposed exposed. There is no tunneling or undermining noted. There is a small amount of serosanguineous drainage noted. The wound margin is flat and intact. There is small (1-33%) pink granulation within the wound bed. There is a large (67-100%) amount of necrotic tissue within the wound bed including Adherent Slough. Wound #28 status is Open. Original cause of wound was Gradually Appeared. The wound is located on the Left,Proximal,Posterior Lower Leg. The wound measures 0.9cm length x 0.5cm width x 0.1cm depth; 0.353cm^2 area and 0.035cm^3 volume. There is Fat Layer (Subcutaneous Tissue) Exposed exposed. There is no tunneling or undermining noted. There is a small amount of serosanguineous drainage noted. The wound margin is flat and intact. There is large (67-100%) pink granulation within the wound bed. There is a small (1-33%) amount of necrotic tissue within the wound bed including Adherent Slough. Wound #3R  status is Open. Original cause of wound was Gradually Appeared. The wound is located on the Right,Posterior Calf. The wound measures 2.5cm length x 2.7cm width x 0.1cm depth; 5.301cm^2 area and 0.53cm^3 volume. There is Fat Layer (Subcutaneous Tissue) Exposed exposed. There is no tunneling or undermining noted. There is a medium amount of serosanguineous drainage noted. The wound margin is distinct with the outline attached to the wound base. There is large (67-100%) red granulation within the wound bed. There is no necrotic tissue within the wound bed. 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 There was a Haematologist Compression Therapy Procedure by Daniel Coria, RN. Post procedure Diagnosis Wound #19: Same as Pre-Procedure Wound #24 Pre-procedure diagnosis of Wound #24 is a Venous Leg Ulcer located on the Left,Distal,Anterior Lower Leg . There was a Haematologist Compression  Therapy Procedure by Daniel Coria, RN. Post procedure Diagnosis Wound #24: Same as Pre-Procedure Wound #25 Pre-procedure diagnosis of Wound #25 is a Venous Leg Ulcer located on the Left,Distal,Posterior Lower Leg . There was a Haematologist Compression Therapy Procedure by Daniel Coria, RN. Post  procedure Diagnosis Wound #25: Same as Pre-Procedure Wound #28 Pre-procedure diagnosis of Wound #28 is a Diabetic Wound/Ulcer of the Lower Extremity located on the Left,Proximal,Posterior Lower Leg . There was a Haematologist Compression Therapy Procedure by Daniel Coria, RN. Post procedure Diagnosis Wound #28: Same as Pre-Procedure Wound #3R Pre-procedure diagnosis of Wound #3R is a Diabetic Wound/Ulcer of the Lower Extremity located on the Right,Posterior Calf . There was a Haematologist Compression Therapy Procedure by Daniel Coria, RN. Post procedure Diagnosis Wound #3R: Same as Pre-Procedure Plan Follow-up Appointments: Return Appointment in 1 week. - Tuesday **********EXTRA TIME**************** Dressing Change Frequency: Wound #13 Left Toe Second: Change dressing every day. Wound #19 Left,Anterior Lower Leg: Do not change entire dressing for one week. Wound #20 Right Toe Great: Change dressing every day. Wound #21 Right Toe Second: Change dressing every day. Wound #24 Left,Distal,Anterior Lower Leg: Do not change entire dressing for one week. Wound #25 Left,Distal,Posterior Lower Leg: Do not change entire dressing for one week. Wound #3R Right,Posterior Calf: Do not change entire dressing for one week. Skin Barriers/Peri-Wound Care: TCA Cream or Ointment Wound Cleansing: May shower with protection. Primary Wound Dressing: Wound #13 Left Toe Second: Calcium Alginate with Silver Wound #19 Left,Anterior Lower Leg: Silver Collagen Wound #20 Right Toe Great: Calcium Alginate with Silver Wound #21 Right Toe Second: Calcium Alginate with Silver Wound #24 Left,Distal,Anterior Lower Leg: Silver Collagen Wound #25 Left,Distal,Posterior Lower Leg: Silver Collagen Wound #3R Right,Posterior Calf: Silver Collagen Secondary Dressing: Dry Gauze - secure toes with tape Edema Control: Unna Boots Bilaterally Off-Loading: Open toe surgical shoe to: - left and right foot 1. We will  continue with silver alginate on both second toes pending amputation 2. Silver collagen to the 3 wounds on the left in the 1 wound on the right lower leg. These look better. 3. We spent time talking about coordination of his care after the amputations on Friday. They do not exactly know the time if they are not able to get back in here for [place] to change the dressing stem with a we will use some silver collagen under his juxta lite stockings. Hopefully his wife can remember how to apply these. Otherwise follow-up with [place] on Tuesday Electronic Signature(s) Signed: 11/21/2019 6:00:34 PM By: [name] MD Entered By: [name] on 11/21/2019 11:13:28 -------------------------------------------------------------------------------- SuperBill Details Patient Name: Date of Service: [name] 11/21/2019 Medical Record [id number] Patient Account Number: [id number] Date of Birth/Sex: Treating RN: Feb 21, 1941 (78 y.o. [name]) Dolores Lory, Morey Hummingbird Primary Care Provider: Hurshel Gross Other Clinician: Referring Provider: Treating Provider/Extender:Alizon Schmeling, [name], Chesterland Gross in Treatment: 38 Diagnosis Coding ICD-10 Codes Code Description L97.211 Non-pressure chronic ulcer of right calf limited to breakdown of skin L97.221 Non-pressure chronic ulcer of left calf limited to breakdown of skin I87.333 Chronic venous hypertension (idiopathic) with ulcer and inflammation of bilateral lower extremity E11.51 Type 2 diabetes mellitus with diabetic peripheral angiopathy without gangrene E11.42 Type 2 diabetes mellitus with diabetic polyneuropathy L97.524 Non-pressure chronic ulcer of other part of left foot with necrosis of bone L97.511 Non-pressure chronic ulcer of other part of right foot limited to breakdown of skin R48.54 Chronic systolic (congestive) heart failure O27.035 Other chronic osteomyelitis, left ankle and foot Facility Procedures  The patient participates with Medicare or  their insurance follows the Medicare Facility Guidelines: CPT4 Code Description Modifier Quantity [id number] [phone] - APPLY Dareen Piano BILATERAL 1 Physician Procedures CPT4 Code Description: [id number] - WC PHYS LEVEL 3 - EST PT ICD-10 Diagnosis Description I71.855 Non-pressure chronic ulcer of right calf limited to bre L97.221 Non-pressure chronic ulcer of left calf limited to brea L97.524 Non-pressure chronic  ulcer of other part of left foot w M15.868 Other chronic osteomyelitis, left ankle and foot Modifier: akdown of skin kdown of skin ith necrosis of Quantity: 1 bone Electronic Signature(s) Signed: 11/21/2019 6:00:34 PM By: [name] MD Entered By: [name] on 11/21/2019 11:14:02

## 2019-11-22 ENCOUNTER — Other Ambulatory Visit (INDEPENDENT_AMBULATORY_CARE_PROVIDER_SITE_OTHER): Payer: Self-pay | Admitting: Internal Medicine

## 2019-11-23 ENCOUNTER — Telehealth: Payer: Self-pay | Admitting: *Deleted

## 2019-11-23 LAB — WOUND CULTURE
GRAM STAIN:: NONE SEEN
MICRO NUMBER:: 10173961
MICRO NUMBER:: 10173964
RESULT:: NO GROWTH
RESULT:: NO GROWTH
SPECIMEN QUALITY:: ADEQUATE
SPECIMEN QUALITY:: ADEQUATE

## 2019-11-23 NOTE — Telephone Encounter (Signed)
Pt's wife states pt is having surgery with tomorrow and he his legs were wrapped by the wound care center and they were told not to remove them but she was given a surgical soap to use prior to the surgery. [name] - Surgery coordinator request directions. I instructed R. Durant to have pt leave dressings intact until tomorrow then remove and use the surgical brush as instructed. R. Durant informed pt.

## 2019-11-23 NOTE — Progress Notes (Addendum)
[name] ([id number]) , Visit Report for 11/21/2019 Arrival Information Details Patient Name: Date of Service: [name] 11/21/2019 9:45 A M Medical Record Number: [id number] Patient Account Number: [id number] Date of Birth/Sex: Treating RN: 1940/11/15 (79 y.o. [name] Primary Care Shaniqua Guillot: Leanord Asal, SA RA H Other Clinician: Referring Rebeccah Ivins: Treating Adrielle Polakowski/Extender: Candie Mile, SA RA H Weeks in Treatment: 26 Visit Information History Since Last Visit Added or deleted any medications: No Patient Arrived: Wheel Chair Any new allergies or adverse reactions: No Arrival Time: 10:02 Had a fall or experienced change in No Accompanied By: wife activities of daily living that may affect Transfer Assistance: None risk of falls: Patient Identification Verified: Yes Signs or symptoms of abuse/neglect since last visito No Secondary Verification Process Completed: Yes Hospitalized since last visit: No Patient Requires Transmission-Based Precautions: No Implantable device outside of the clinic excluding No Patient Has Alerts: Yes cellular tissue based products placed in the center Patient Alerts: Patient on Blood Thinner since last visit: Has Dressing in Place as Prescribed: Yes Has Compression in Place as Prescribed: Yes Pain Present Now: No Electronic Signature(s) Signed: 11/23/2019 8:56:01 AM By: Levan Hurst RN, BSN Entered By: Levan Hurst on 11/21/2019 10:06:56 -------------------------------------------------------------------------------- Compression Therapy Details Patient Name: Date of Service: Daniel Phenix D. 11/21/2019 9:45 A M Medical Record Number: [id number] Patient Account Number: [id number] Date of Birth/Sex: Treating RN: July 26, 1941 (78 y.o. [name]) [name] Primary Care Guillermina Shaft: Leanord Asal, SA RA H Other Clinician: Referring Ogechi Kuehnel: Treating Reggie Welge/Extender: Candie Mile, SA RA H Weeks in Treatment: 38 Compression  Therapy Performed for Wound Assessment: Wound #19 Left,Anterior Lower Leg Performed By: Jake Church, RN Compression Type: Rolena Infante Post Procedure Diagnosis Same as Pre-procedure Electronic Signature(s) Signed: 11/21/2019 6:06:02 PM By: [name] RN Entered By: [name] on 11/21/2019 11:05:40 -------------------------------------------------------------------------------- Compression Therapy Details Patient Name: Date of Service: [name] 11/21/2019 9:45 A M Medical Record Number: [id number] Patient Account Number: [id number] Date of Birth/Sex: Treating RN: 06-Oct-1940 (78 y.o. [name]) [name] Primary Care Kayleanna Lorman: Leanord Asal, SA RA H Other Clinician: Referring Amaris Garrette: Treating Diyana Starrett/Extender: Candie Mile, SA RA H Weeks in Treatment: 38 Compression Therapy Performed for Wound Assessment: Wound #24 Left,Distal,Anterior Lower Leg Performed By: Jake Church, RN Compression Type: Rolena Infante Post Procedure Diagnosis Same as Pre-procedure Electronic Signature(s) Signed: 11/21/2019 6:06:02 PM By: [name] RN Entered By: [name] on 11/21/2019 11:05:40 -------------------------------------------------------------------------------- Compression Therapy Details Patient Name: Date of Service: [name] 11/21/2019 9:45 A M Medical Record Number: [id number] Patient Account Number: [id number] Date of Birth/Sex: Treating RN: [birth year] (78 y.o. [name]) [name] Primary Care Kavari Parrillo: Leanord Asal, SA RA H Other Clinician: Referring Allisyn Kunz: Treating Thana Ramp/Extender: Candie Mile, SA RA H Weeks in Treatment: 38 Compression Therapy Performed for Wound Assessment: Wound #25 Left,Distal,Posterior Lower Leg Performed By: Jake Church, RN Compression Type: Rolena Infante Post Procedure Diagnosis Same as Pre-procedure Electronic Signature(s) Signed: 11/21/2019 6:06:02 PM By: [name] RN Entered By: [name] on  11/21/2019 11:05:40 -------------------------------------------------------------------------------- Compression Therapy Details Patient Name: Date of Service: [name] 11/21/2019 9:45 A M Medical Record Number: [id number] Patient Account Number: [id number] Date of Birth/Sex: Treating RN: 1941/05/06 (78 y.o. [name]) [name] Primary Care Nasiya Pascual: Leanord Asal, SA RA H Other Clinician: Referring Felice Hope: Treating Tallin Hart/Extender: Candie Mile, SA RA H Weeks in Treatment: 38 Compression Therapy Performed for Wound  Assessment: Wound #28 Left,Proximal,Posterior Lower Leg Performed By: Clinician [name], RN Compression Type: Rolena Infante Post Procedure Diagnosis Same as Pre-procedure Electronic Signature(s) Signed: 11/21/2019 6:06:02 PM By: [name] RN Entered By: [name] on 11/21/2019 11:05:40 -------------------------------------------------------------------------------- Compression Therapy Details Patient Name: Date of Service: [name] 11/21/2019 9:45 A M Medical Record Number: [id number] Patient Account Number: [id number] Date of Birth/Sex: Treating RN: 09-Apr-1941 (78 y.o. [name]) [name] Primary Care Shirleymae Hauth: Leanord Asal, SA RA H Other Clinician: Referring Mattia Osterman: Treating Kaityln Kallstrom/Extender: Candie Mile, SA RA H Weeks in Treatment: 38 Compression Therapy Performed for Wound Assessment: Wound #3R Right,Posterior Calf Performed By: Jake Church, RN Compression Type: Rolena Infante Post Procedure Diagnosis Same as Pre-procedure Electronic Signature(s) Signed: 11/21/2019 6:06:02 PM By: [name] RN Entered By: [name] on 11/21/2019 11:05:40 -------------------------------------------------------------------------------- Encounter Discharge Information Details Patient Name: Date of Service: Daniel Phenix D. 11/21/2019 9:45 A M Medical Record Number: [id number] Patient Account Number: [id number] Date of  Birth/Sex: Treating RN: 1940/10/01 (79 y.o. [name] Primary Care Emmilia Sowder: Leanord Asal, SA RA H Other Clinician: Referring Stephaine Breshears: Treating Dollye Glasser/Extender: Candie Mile, SA RA H Weeks in Treatment: 24 Encounter Discharge Information Items Discharge Condition: Stable Ambulatory Status: Wheelchair Discharge Destination: Home Transportation: Private Auto Accompanied By: wife Schedule Follow-up Appointment: Yes Clinical Summary of Care: Patient Declined Electronic Signature(s) Signed: 11/21/2019 6:05:13 PM By: Kela Millin Entered By: Kela Millin on 11/21/2019 12:15:54 -------------------------------------------------------------------------------- Lower Extremity Assessment Details Patient Name: Date of Service: CARSTEN, CARSTARPHEN 11/21/2019 9:45 A M Medical Record Number: [id number] Patient Account Number: [id number] Date of Birth/Sex: Treating RN: September 20, 1941 (79 y.o. [name] Primary Care Zeph Riebel: Leanord Asal, SA RA H Other Clinician: Referring Kindra Bickham: Treating Alayssa Flinchum/Extender: Candie Mile, SA RA H Weeks in Treatment: 38 Edema Assessment Assessed: [Left: No] [Right: No] Edema: [Left: Yes] [Right: Yes] Calf Left: Right: Point of Measurement: 31 cm From Medial Instep 29.8 cm 29.2 cm Ankle Left: Right: Point of Measurement: 11 cm From Medial Instep 19.5 cm 19.8 cm Vascular Assessment Pulses: Dorsalis Pedis Palpable: [Left:Yes] [Right:Yes] Electronic Signature(s) Signed: 11/23/2019 8:56:01 AM By: Levan Hurst RN, BSN Entered By: Levan Hurst on 11/21/2019 10:14:18 -------------------------------------------------------------------------------- Multi Wound Chart Details Patient Name: Date of Service: Daniel Phenix D. 11/21/2019 9:45 A M Medical Record Number: [id number] Patient Account Number: [id number] Date of Birth/Sex: Treating RN: 1941-04-01 (78 y.o. [name]) [name] Primary Care Ingris Pasquarella: Leanord Asal, SA RA H Other  Clinician: Referring Joanie Duprey: Treating Shakea Isip/Extender: Candie Mile, SA RA H Weeks in Treatment: 38 Vital Signs Height(in): 74 Pulse(bpm): 22 Weight(lbs): 212 Blood Pressure(mmHg): 108/52 Body Mass Index(BMI): 27 Temperature(F): 97.7 Respiratory Rate(breaths/min): 20 Photos: [13:No 38 Left T Second oe] [19:No Photos Left Lower Leg - Anterior] [20:No Photos Right T Great oe] Wound Location: [13:Gradually Appeared] [19:Gradually Appeared] [20:Gradually Appeared] Wounding Event: [13:Diabetic Wound/Ulcer of the Lower] [19:Venous Leg Ulcer] [20:Diabetic Wound/Ulcer of the Lower] Primary Etiology: [13:Extremity N/A] [19:N/A] [20:Extremity N/A] Secondary Etiology: [13:Cataracts, Hypertension, Peripheral] [19:Cataracts, Hypertension, Peripheral] [20:Cataracts, Hypertension, Peripheral] Comorbid History: [13:Venous Disease, Type II Diabetes, Gout, Received Radiation 08/22/2019] [19:Venous Disease, Type II Diabetes, Gout, Received Radiation 09/23/2019] [20:Venous Disease, Type II Diabetes, Gout, Received Radiation 09/23/2019] Date Acquired: [13:13] [19:8] [20:8] Weeks of Treatment: [13:Open] [19:Open] [20:Open] Wound Status: [13:No] [19:No] [20:No] Wound Recurrence: [13:No] [19:No] [20:No] Clustered Wound: [13:N/A] [19:1] [20:N/A] Clustered Quantity: [13:1.6x2x0.2] [19:1.7x1.2x0.2] [20:0.5x0.3x0.1] Measurements L x W x D (cm) [13:2.513] [19:1.602] [20:0.118] A (  cm) : rea [13:0.503] [19:0.32] [20:0.012] Volume (cm) : [13:18.40%] [19:97.00%] [20:76.90%] % Reduction in Area: [13:-63.30%] [19:94.00%] [20:76.50%] % Reduction in Volume: [13:Grade 2] [19:Full Thickness Without Exposed] [20:Grade 2] Classification: [13:Small] [19:Support Structures Medium] [20:Small] Exudate A mount: [13:Serosanguineous] [19:Serosanguineous] [20:Serosanguineous] Exudate Type: [13:red, brown] [19:red, brown] [20:red, brown] Exudate Color: [13:Distinct, outline attached] [19:Flat and Intact]  [20:Distinct, outline attached] Wound Margin: [13:None Present (0%)] [19:Large (67-100%)] [20:Small (1-33%)] Granulation Amount: [13:N/A] [19:Pink] [20:Pink] Granulation Quality: [13:Large (67-100%)] [19:Small (1-33%)] [20:Large (67-100%)] Necrotic Amount: [13:Eschar, Adherent Slough] [19:Adherent Slough] [20:Adherent Slough] Necrotic Tissue: [13:Fat Layer (Subcutaneous Tissue)] [19:Fat Layer (Subcutaneous Tissue)] [20:Fat Layer (Subcutaneous Tissue)] Exposed Structures: [13:Exposed: Yes Joint: Yes Bone: Yes Fascia: No Tendon: No Muscle: No None] [19:Exposed: Yes Fascia: No Tendon: No Muscle: No Joint: No Bone: No Small (1-33%)] [20:Exposed: Yes Fascia: No Tendon: No Muscle: No Joint: No Bone: No Large (67-100%)] Epithelialization: [13:N/A] [19:Compression Therapy] [20:N/A] Wound Number: _0 Photos: No Photos No Photos No Photos Right T Second oe Left Lower Leg - Anterior, Distal Left Lower Leg - Posterior, Distal Wound Location: Gradually Appeared Gradually Appeared Gradually Appeared Wounding Event: Diabetic Wound/Ulcer of the Lower Venous Leg Ulcer Venous Leg Ulcer Primary Etiology: Extremity N/A Diabetic Wound/Ulcer of the Lower Diabetic Wound/Ulcer of the Lower Secondary Etiology: Extremity Extremity Cataracts, Hypertension, Peripheral Cataracts, Hypertension, Peripheral Cataracts, Hypertension, Peripheral Comorbid History: Venous Disease, Type II Diabetes, Venous Disease, Type II Diabetes, Venous Disease, Type II Diabetes, Gout, Received Radiation Gout, Received Radiation Gout, Received Radiation 09/23/2019 10/04/2019 10/04/2019 Date Acquired: _1 Weeks of Treatment: Open Open Open Wound Status: No No No Wound Recurrence: No Yes Yes Clustered Wound: N/A 2 1 Clustered Quantity: 1.5x1.5x0.1 3.6x1.6x0.1 1.7x1.7x0.1 Measurements L x W x D (cm) 1.767 4.524 2.27 A (cm) : rea 0.177 0.452 0.227 Volume (cm) : -167.70% 29.10% 90.80% % Reduction in Area: -168.20% 29.20%  90.80% % Reduction in Volume: Grade 2 Full Thickness Without Exposed Full Thickness Without Exposed Classification: Support Structures Support Structures Medium Medium Small Exudate A mount: Serosanguineous Serosanguineous Serosanguineous Exudate Type: red, brown red, brown red, brown Exudate Color: Flat and Intact Flat and Intact Flat and Intact Wound Margin: None Present (0%) Large (67-100%) Small (1-33%) Granulation Amount: N/A Red Pink Granulation Quality: Large (67-100%) Small (1-33%) Large (67-100%) Necrotic Amount: Eschar, Adherent Slough Eschar, Adherent Slough Adherent Slough Necrotic Tissue: Fat Layer (Subcutaneous Tissue) Fat Layer (Subcutaneous Tissue) Fat Layer (Subcutaneous Tissue) Exposed Structures: Exposed: Yes Exposed: Yes Exposed: Yes Bone: Yes Fascia: No Fascia: No Fascia: No Tendon: No Tendon: No Tendon: No Muscle: No Muscle: No Muscle: No Joint: No Joint: No Joint: No Bone: No Bone: No None Small (1-33%) Small (1-33%) Epithelialization: N/A Compression Therapy Compression Therapy Procedures Performed: Wound Number: 28 3R N/A Photos: No Photos No Photos N/A Left Lower Leg - Posterior, Proximal Right Calf - Posterior N/A Wound Location: Gradually Appeared Gradually Appeared N/A Wounding Event: Diabetic Wound/Ulcer of the Lower Diabetic Wound/Ulcer of the Lower N/A Primary Etiology: Extremity Extremity Venous Leg Ulcer N/A N/A Secondary Etiology: Cataracts, Hypertension, Peripheral Cataracts, Hypertension, Peripheral N/A Comorbid History: Venous Disease, Type II Diabetes, Venous Disease, Type II Diabetes, Gout, Received Radiation Gout, Received Radiation 10/31/2019 02/28/2019 N/A Date Acquired: 3 38 N/A Weeks of Treatment: Open Open N/A Wound Status: No Yes N/A Wound Recurrence: No No N/A Clustered Wound: N/A N/A N/A Clustered Quantity: 0.9x0.5x0.1 2.5x2.7x0.1 N/A Measurements L x W x D (cm) 0.353 5.301 N/A A (cm)  : rea 0.035 0.53 N/A Volume (cm) :  40.10% 78.40% N/A % Reduction in Area: 40.70% 78.40% N/A % Reduction in Volume: Grade 1 Grade 2 N/A Classification: Small Medium N/A Exudate A mount: Serosanguineous Serosanguineous N/A Exudate Type: red, brown red, brown N/A Exudate Color: Flat and Intact Distinct, outline attached N/A Wound Margin: Large (67-100%) Large (67-100%) N/A Granulation A mount: Pink Red N/A Granulation Quality: Small (1-33%) None Present (0%) N/A Necrotic A mount: Adherent Slough N/A N/A Necrotic Tissue: Fat Layer (Subcutaneous Tissue) Fat Layer (Subcutaneous Tissue) N/A Exposed Structures: Exposed: Yes Exposed: Yes Fascia: No Fascia: No Tendon: No Tendon: No Muscle: No Muscle: No Joint: No Joint: No Bone: No Bone: No Medium (34-66%) Medium (34-66%) N/A Epithelialization: Compression Therapy Compression Therapy N/A Procedures Performed: Treatment Notes Electronic Signature(s) Signed: 11/21/2019 6:00:34 PM By: [name] MD Signed: 11/21/2019 6:06:02 PM By: [name] RN Entered By: [name] on 11/21/2019 11:07:54 -------------------------------------------------------------------------------- Multi-Disciplinary Care Plan Details Patient Name: Date of Service: Daniel Phenix D. 11/21/2019 9:45 A M Medical Record Number: [id number] Patient Account Number: [id number] Date of Birth/Sex: Treating RN: 02/24/1941 (78 y.o. [name] Primary Care Jasminne Mealy: Leanord Asal, SA RA H Other Clinician: Referring Chevy Sweigert: Treating Dlynn Ranes/Extender: Candie Mile, SA RA H Weeks in Treatment: 38 Active Inactive Wound/Skin Impairment Nursing Diagnoses: Knowledge deficit related to ulceration/compromised skin integrity Goals: Patient/caregiver will verbalize understanding of skin care regimen Date Initiated: 02/28/2019 Target Resolution Date: 12/01/2019 Goal Status: Active Ulcer/skin breakdown will have a volume reduction of 30% by week  4 Date Initiated: 02/28/2019 Date Inactivated: 04/04/2019 Target Resolution Date: 03/31/2019 Goal Status: Met Ulcer/skin breakdown will have a volume reduction of 50% by week 8 Date Initiated: 04/04/2019 Date Inactivated: 05/09/2019 Target Resolution Date: 05/05/2019 Goal Status: Met Ulcer/skin breakdown will have a volume reduction of 80% by week 12 Date Initiated: 05/09/2019 Date Inactivated: 06/13/2019 Target Resolution Date: 06/09/2019 Unmet Reason: comorbities/new Goal Status: Unmet wounds Ulcer/skin breakdown will heal within 14 weeks Date Initiated: 06/13/2019 Date Inactivated: 07/11/2019 Target Resolution Date: 07/07/2019 Goal Status: Unmet Unmet Reason: comorbityies Interventions: Assess patient/caregiver ability to obtain necessary supplies Assess patient/caregiver ability to perform ulcer/skin care regimen upon admission and as needed Assess ulceration(s) every visit Notes: Electronic Signature(s) Signed: 11/21/2019 6:06:02 PM By: [name] RN Entered By: [name] on 11/21/2019 10:03:17 -------------------------------------------------------------------------------- Pain Assessment Details Patient Name: Date of Service: RIDGELY, ANASTACIO 11/21/2019 9:45 A M Medical Record Number: [id number] Patient Account Number: [id number] Date of Birth/Sex: Treating RN: 1941-08-02 (79 y.o. [name] Primary Care Audric Venn: Leanord Asal, SA RA H Other Clinician: Referring Syriah Delisi: Treating Angelic Schnelle/Extender: Candie Mile, SA RA H Weeks in Treatment: 55 Active Problems Location of Pain Severity and Description of Pain Patient Has Paino No Site Locations Pain Management and Medication Current Pain Management: Electronic Signature(s) Signed: 11/23/2019 8:56:01 AM By: Levan Hurst RN, BSN Entered By: Levan Hurst on 11/21/2019 10:07:23 -------------------------------------------------------------------------------- Patient/Caregiver Education Details Patient  Name: Date of Service: Lattie Corns 2/23/2021andnbsp9:45 A M Medical Record Number: [id number] Patient Account Number: [id number] Date of Birth/Gender: Treating RN: 06-08-1941 (78 y.o. [name]) [name] Primary Care Physician: Leanord Asal, SA RA H Other Clinician: Referring Physician: Treating Physician/Extender: Candie Mile, SA RA H Weeks in Treatment: 38 Education Assessment Education Provided To: Patient Education Topics Provided Wound/Skin Impairment: Methods: Explain/Verbal Responses: State content correctly Electronic Signature(s) Signed: 11/21/2019 6:06:02 PM By: [name] RN Entered By: [name] on 11/21/2019 10:03:31 -------------------------------------------------------------------------------- Wound Assessment Details Patient Name: Date of Service: Joya Gaskins,  Kennis Carina 11/21/2019 9:45 A M Medical Record Number: [id number] Patient Account Number: [id number] Date of Birth/Sex: Treating RN: 1941-06-08 (78 y.o. [name]) [name] Primary Care Yaw Escoto: Leanord Asal, SA RA H Other Clinician: Referring Izumi Mixon: Treating Rashaud Ybarbo/Extender: Candie Mile, SA RA H Weeks in Treatment: 38 Wound Status Wound Number: 13 Primary Diabetic Wound/Ulcer of the Lower Extremity Etiology: Wound Location: Left T Second oe Wound Open Wounding Event: Gradually Appeared Status: Date Acquired: 08/22/2019 Comorbid Cataracts, Hypertension, Peripheral Venous Disease, Type II Weeks Of Treatment: 13 History: Diabetes, Gout, Received Radiation Clustered Wound: No Photos Wound Measurements Length: (cm) 1.6 Width: (cm) 2 Depth: (cm) 0.2 Area: (cm) 2.513 Volume: (cm) 0.503 % Reduction in Area: 18.4% % Reduction in Volume: -63.3% Epithelialization: None Tunneling: No Undermining: No Wound Description Classification: Grade 2 Wound Margin: Distinct, outline attached Exudate Amount: Small Exudate Type: Serosanguineous Exudate Color: red, brown Foul Odor After  Cleansing: No Slough/Fibrino Yes Wound Bed Granulation Amount: None Present (0%) Exposed Structure Necrotic Amount: Large (67-100%) Fascia Exposed: No Necrotic Quality: Eschar, Adherent Slough Fat Layer (Subcutaneous Tissue) Exposed: Yes Tendon Exposed: No Muscle Exposed: No Joint Exposed: Yes Bone Exposed: Yes Electronic Signature(s) Signed: 11/24/2019 5:12:28 PM By: Mikeal Hawthorne EMT/HBOT Signed: 03/26/2020 5:26:37 PM By: [name] RN Previous Signature: 11/21/2019 5:16:59 PM Version By: Mikeal Hawthorne EMT/HBOT Previous Signature: 11/21/2019 6:06:02 PM Version By: [name] RN Entered By: Mikeal Hawthorne on 11/24/2019 08:22:07 -------------------------------------------------------------------------------- Wound Assessment Details Patient Name: Date of Service: Daniel Phenix D. 11/21/2019 9:45 A M Medical Record Number: [id number] Patient Account Number: [id number] Date of Birth/Sex: Treating RN: 03-Feb-1941 (78 y.o. [name]) [name] Primary Care Al Bracewell: Leanord Asal, SA RA H Other Clinician: Referring Abundio Teuscher: Treating Nicodemus Denk/Extender: Candie Mile, SA RA H Weeks in Treatment: 38 Wound Status Wound Number: 19 Primary Venous Leg Ulcer Etiology: Wound Location: Left Lower Leg - Anterior Wound Open Wounding Event: Gradually Appeared Status: Date Acquired: 09/23/2019 Comorbid Cataracts, Hypertension, Peripheral Venous Disease, Type II Weeks Of Treatment: 8 History: Diabetes, Gout, Received Radiation Clustered Wound: No Photos Wound Measurements Length: (cm) 1.7 Width: (cm) 1.2 Depth: (cm) 0.2 Clustered Quantity: 1 Area: (cm) 1.602 Volume: (cm) 0.32 % Reduction in Area: 97% % Reduction in Volume: 94% Epithelialization: Small (1-33%) Tunneling: No Undermining: No Wound Description Classification: Full Thickness Without Exposed Support Structures Wound Margin: Flat and Intact Exudate Amount: Medium Exudate Type: Serosanguineous Exudate Color: red,  brown Foul Odor After Cleansing: No Slough/Fibrino Yes Wound Bed Granulation Amount: Large (67-100%) Exposed Structure Granulation Quality: Pink Fascia Exposed: No Necrotic Amount: Small (1-33%) Fat Layer (Subcutaneous Tissue) Exposed: Yes Necrotic Quality: Adherent Slough Tendon Exposed: No Muscle Exposed: No Joint Exposed: No Bone Exposed: No Electronic Signature(s) Signed: 11/21/2019 5:16:59 PM By: Mikeal Hawthorne EMT/HBOT Signed: 11/21/2019 6:06:02 PM By: [name] RN Entered By: Mikeal Hawthorne on 11/21/2019 15:03:11 -------------------------------------------------------------------------------- Wound Assessment Details Patient Name: Date of Service: Daniel Phenix D. 11/21/2019 9:45 A M Medical Record Number: [id number] Patient Account Number: [id number] Date of Birth/Sex: Treating RN: [birth year] (78 y.o. [name]) [name] Primary Care Jansen Goodpasture: Leanord Asal, SA RA H Other Clinician: Referring Sabrin Dunlevy: Treating Junice Fei/Extender: Candie Mile, SA RA H Weeks in Treatment: 38 Wound Status Wound Number: 20 Primary Diabetic Wound/Ulcer of the Lower Extremity Etiology: Wound Location: Right T Great oe Wound Open Wounding Event: Gradually Appeared Status: Date Acquired: 09/23/2019 Comorbid Cataracts, Hypertension, Peripheral Venous Disease, Type II Weeks Of Treatment: 8 History: Diabetes, Gout, Received Radiation Clustered  Wound: No Photos Wound Measurements Length: (cm) 0.5 Width: (cm) 0.3 Depth: (cm) 0.1 Area: (cm) 0.118 Volume: (cm) 0.012 % Reduction in Area: 76.9% % Reduction in Volume: 76.5% Epithelialization: Large (67-100%) Tunneling: No Undermining: No Wound Description Classification: Grade 2 Wound Margin: Distinct, outline attached Exudate Amount: Small Exudate Type: Serosanguineous Exudate Color: red, brown Foul Odor After Cleansing: No Slough/Fibrino No Wound Bed Granulation Amount: Small (1-33%) Exposed Structure Granulation Quality:  Pink Fascia Exposed: No Necrotic Amount: Large (67-100%) Fat Layer (Subcutaneous Tissue) Exposed: Yes Necrotic Quality: Adherent Slough Tendon Exposed: No Muscle Exposed: No Joint Exposed: No Bone Exposed: No Electronic Signature(s) Signed: 11/21/2019 5:16:59 PM By: Mikeal Hawthorne EMT/HBOT Signed: 11/21/2019 6:06:02 PM By: [name] RN Entered By: Mikeal Hawthorne on 11/21/2019 15:05:28 -------------------------------------------------------------------------------- Wound Assessment Details Patient Name: Date of Service: Daniel Phenix D. 11/21/2019 9:45 A M Medical Record Number: [id number] Patient Account Number: [id number] Date of Birth/Sex: Treating RN: May 21, 1941 (78 y.o. [name]) [name] Primary Care Rashan Rounsaville: Leanord Asal, SA RA H Other Clinician: Referring Svara Twyman: Treating Hiral Lukasiewicz/Extender: Candie Mile, SA RA H Weeks in Treatment: 38 Wound Status Wound Number: 21 Primary Diabetic Wound/Ulcer of the Lower Extremity Etiology: Wound Location: Right T [address] o[address] Wound Open Wounding Event: Gradually Appeared Status: Date Acquired: 09/23/2019 Comorbid Cataracts, Hypertension, Peripheral Venous Disease, Type II Weeks Of Treatment: 8 History: Diabetes, Gout, Received Radiation Clustered Wound: No Photos Wound Measurements Length: (cm) 1.5 Width: (cm) 1.5 Depth: (cm) 0.1 Area: (cm) 1.767 Volume: (cm) 0.177 % Reduction in Area: -167.7% % Reduction in Volume: -168.2% Epithelialization: None Tunneling: No Undermining: No Wound Description Classification: Grade 2 Wound Margin: Flat and Intact Exudate Amount: Medium Exudate Type: Serosanguineous Exudate Color: red, brown Foul Odor After Cleansing: No Slough/Fibrino Yes Wound Bed Granulation Amount: None Present (0%) Exposed Structure Necrotic Amount: Large (67-100%) Fascia Exposed: No Necrotic Quality: Eschar, Adherent Slough Fat Layer (Subcutaneous Tissue) Exposed: Yes Tendon Exposed: No Muscle  Exposed: No Joint Exposed: No Bone Exposed: Yes Electronic Signature(s) Signed: 11/21/2019 5:16:59 PM By: Mikeal Hawthorne EMT/HBOT Signed: 11/21/2019 6:06:02 PM By: [name] RN Entered By: Mikeal Hawthorne on 11/21/2019 15:05:53 -------------------------------------------------------------------------------- Wound Assessment Details Patient Name: Date of Service: Daniel Phenix D. 11/21/2019 9:45 A M Medical Record Number: [id number] Patient Account Number: [id number] Date of Birth/Sex: Treating RN: [birth year] (78 y.o. [name]) [name] Primary Care Miku Udall: Leanord Asal, SA RA H Other Clinician: Referring Shiloh Swopes: Treating Chanley Mcenery/Extender: Candie Mile, SA RA H Weeks in Treatment: 38 Wound Status Wound Number: 24 Primary Venous Leg Ulcer Etiology: Wound Location: Left Lower Leg - Anterior, Distal Secondary Diabetic Wound/Ulcer of the Lower Extremity Wounding Event: Gradually Appeared Etiology: Date Acquired: 10/04/2019 Wound Open Weeks Of Treatment: 6 Status: Clustered Wound: Yes Comorbid Cataracts, Hypertension, Peripheral Venous Disease, Type II History: Diabetes, Gout, Received Radiation Photos Wound Measurements Length: (cm) 3 Width: (cm) 1 Depth: (cm) 0 Clustered Quantity: 2 Area: (cm) Volume: (cm) .6 % Reduction in Area: 29.1% .6 % Reduction in Volume: 29.2% .1 Epithelialization: Small (1-33%) Tunneling: No 4.524 Undermining: No 0.452 Wound Description Classification: Full Thickness Without Exposed Support Structures Wound Margin: Flat and Intact Exudate Amount: Medium Exudate Type: Serosanguineous Exudate Color: red, brown Foul Odor After Cleansing: No Slough/Fibrino Yes Wound Bed Granulation Amount: Large (67-100%) Exposed Structure Granulation Quality: Red Fascia Exposed: No Necrotic Amount: Small (1-33%) Fat Layer (Subcutaneous Tissue) Exposed: Yes Necrotic Quality: Eschar, Adherent Slough Tendon Exposed: No Muscle Exposed: No Joint  Exposed: No Bone Exposed: No Electronic  Signature(s) Signed: 11/21/2019 5:16:59 PM By: Mikeal Hawthorne EMT/HBOT Signed: 11/21/2019 6:06:02 PM By: [name] RN Entered By: Mikeal Hawthorne on 11/21/2019 15:03:38 -------------------------------------------------------------------------------- Wound Assessment Details Patient Name: Date of Service: Daniel Phenix D. 11/21/2019 9:45 A M Medical Record Number: [id number] Patient Account Number: [id number] Date of Birth/Sex: Treating RN: 17-Jan-1941 (78 y.o. [name]) [name] Primary Care Kauri Garson: Other Clinician: Leanord Asal, SA RA H Referring Rena Hunke: Treating Ari Bernabei/Extender: Candie Mile, SA RA H Weeks in Treatment: 38 Wound Status Wound Number: 25 Primary Venous Leg Ulcer Etiology: Wound Location: Left Lower Leg - Posterior, Distal Secondary Diabetic Wound/Ulcer of the Lower Extremity Wounding Event: Gradually Appeared Etiology: Date Acquired: 10/04/2019 Wound Open Weeks Of Treatment: 6 Status: Clustered Wound: Yes Comorbid Cataracts, Hypertension, Peripheral Venous Disease, Type II History: Diabetes, Gout, Received Radiation Photos Wound Measurements Length: (cm) Width: (cm) Depth: (cm) Clustered Quantity: Area: (cm) Volume: (cm) 1.7 % Reduction in Area: 90.8% 1.7 % Reduction in Volume: 90.8% 0.1 Epithelialization: Small (1-33%) 1 Tunneling: No 2.27 Undermining: No 0.227 Wound Description Classification: Full Thickness Without Exposed Support Structures Wound Margin: Flat and Intact Exudate Amount: Small Exudate Type: Serosanguineous Exudate Color: red, brown Foul Odor After Cleansing: No Slough/Fibrino Yes Wound Bed Granulation Amount: Small (1-33%) Exposed Structure Granulation Quality: Pink Fascia Exposed: No Necrotic Amount: Large (67-100%) Fat Layer (Subcutaneous Tissue) Exposed: Yes Necrotic Quality: Adherent Slough Tendon Exposed: No Muscle Exposed: No Joint Exposed: No Bone Exposed:  No Electronic Signature(s) Signed: 11/21/2019 5:16:59 PM By: Mikeal Hawthorne EMT/HBOT Signed: 11/21/2019 6:06:02 PM By: [name] RN Entered By: Mikeal Hawthorne on 11/21/2019 15:04:33 -------------------------------------------------------------------------------- Wound Assessment Details Patient Name: Date of Service: Daniel Phenix D. 11/21/2019 9:45 A M Medical Record Number: [id number] Patient Account Number: [id number] Date of Birth/Sex: Treating RN: 1941/05/17 (78 y.o. [name]) [name] Primary Care Elmyra Banwart: Leanord Asal, SA RA H Other Clinician: Referring Lindalou Soltis: Treating Adalis Gatti/Extender: Candie Mile, SA RA H Weeks in Treatment: 38 Wound Status Wound Number: 28 Primary Diabetic Wound/Ulcer of the Lower Extremity Etiology: Wound Location: Left Lower Leg - Posterior, Proximal Secondary Venous Leg Ulcer Wounding Event: Gradually Appeared Etiology: Date Acquired: 10/31/2019 Wound Open Weeks Of Treatment: 3 Status: Clustered Wound: No Comorbid Cataracts, Hypertension, Peripheral Venous Disease, Type II History: Diabetes, Gout, Received Radiation Photos Wound Measurements Length: (cm) 0.9 Width: (cm) 0.5 Depth: (cm) 0.1 Area: (cm) 0.353 Volume: (cm) 0.035 % Reduction in Area: 40.1% % Reduction in Volume: 40.7% Epithelialization: Medium (34-66%) Tunneling: No Undermining: No Wound Description Classification: Grade 1 Wound Margin: Flat and Intact Exudate Amount: Small Exudate Type: Serosanguineous Exudate Color: red, brown Foul Odor After Cleansing: No Slough/Fibrino No Wound Bed Granulation Amount: Large (67-100%) Exposed Structure Granulation Quality: Pink Fascia Exposed: No Necrotic Amount: Small (1-33%) Fat Layer (Subcutaneous Tissue) Exposed: Yes Necrotic Quality: Adherent Slough Tendon Exposed: No Muscle Exposed: No Joint Exposed: No Bone Exposed: No Electronic Signature(s) Signed: 11/21/2019 5:16:59 PM By: Mikeal Hawthorne EMT/HBOT Signed:  11/21/2019 6:06:02 PM By: [name] RN Entered By: Mikeal Hawthorne on 11/21/2019 15:04:57 -------------------------------------------------------------------------------- Wound Assessment Details Patient Name: Date of Service: Daniel Phenix D. 11/21/2019 9:45 A M Medical Record Number: [id number] Patient Account Number: [id number] Date of Birth/Sex: Treating RN: [birth year] (78 y.o. [name]) [name] Primary Care Kimberlyn Quiocho: Leanord Asal, SA RA H Other Clinician: Referring Barabara Motz: Treating Shemia Bevel/Extender: Candie Mile, SA RA H Weeks in Treatment: 38 Wound Status Wound Number: [id number] Primary Diabetic Wound/Ulcer of the Lower Extremity Etiology: Wound Location:  Right Calf - Posterior Wound Open Wounding Event: Gradually Appeared Status: Status: Date Acquired: 02/28/2019 Comorbid Cataracts, Hypertension, Peripheral Venous Disease, Type II Weeks Of Treatment: 38 History: Diabetes, Gout, Received Radiation Clustered Wound: No Photos Wound Measurements Length: (cm) 2.5 Width: (cm) 2.7 Depth: (cm) 0.1 Area: (cm) 5.301 Volume: (cm) 0.53 % Reduction in Area: 78.4% % Reduction in Volume: 78.4% Epithelialization: Medium (34-66%) Tunneling: No Undermining: No Wound Description Classification: Grade 2 Wound Margin: Distinct, outline attached Exudate Amount: Medium Exudate Type: Serosanguineous Exudate Color: red, brown Foul Odor After Cleansing: No Slough/Fibrino No Wound Bed Granulation Amount: Large (67-100%) Exposed Structure Granulation Quality: Red Fascia Exposed: No Necrotic Amount: None Present (0%) Fat Layer (Subcutaneous Tissue) Exposed: Yes Tendon Exposed: No Muscle Exposed: No Joint Exposed: No Bone Exposed: No Electronic Signature(s) Signed: 11/21/2019 5:16:59 PM By: Mikeal Hawthorne EMT/HBOT Signed: 11/21/2019 6:06:02 PM By: [name] RN Entered By: Mikeal Hawthorne on 11/21/2019  15:06:23 -------------------------------------------------------------------------------- Vitals Details Patient Name: Date of Service: Daniel Phenix D. 11/21/2019 9:45 A M Medical Record Number: [id number] Patient Account Number: [id number] Date of Birth/Sex: Treating RN: [birth year] (79 y.o. [name] Primary Care Fredrick Dray: Leanord Asal, SA RA H Other Clinician: Referring Jomari Bartnik: Treating Teresita Fanton/Extender: Candie Mile, SA RA H Weeks in Treatment: 9 Vital Signs Time Taken: 10:05 Temperature (F): 97.7 Height (in): 74 Pulse (bpm): 87 Weight (lbs): 212 Respiratory Rate (breaths/min): 20 Body Mass Index (BMI): 27.2 Blood Pressure (mmHg): 108/52 Reference Range: 80 - 120 mg / dl Electronic Signature(s) Signed: 11/23/2019 8:56:01 AM By: Levan Hurst RN, BSN Signed: 11/23/2019 8:56:01 AM By: Levan Hurst RN, BSN Entered By: Levan Hurst on 11/21/2019 10:07:14

## 2019-11-24 ENCOUNTER — Telehealth: Payer: Self-pay | Admitting: *Deleted

## 2019-11-24 ENCOUNTER — Other Ambulatory Visit: Payer: Self-pay | Admitting: Podiatry

## 2019-11-24 MED ORDER — OXYCODONE-ACETAMINOPHEN 5-325 MG PO TABS: 1.0000 | ORAL_TABLET | Freq: Four times a day (QID) | ORAL | 0 refills | Status: DC | PRN

## 2019-11-24 NOTE — H&P (View-Only) (Signed)
PRN postop 

## 2019-11-24 NOTE — Telephone Encounter (Signed)
I spoke with Hoyle Sauer and she was crying. Hoyle Sauer states pt has been having wound care and the doctors say it looks good, but she thinks it looks terrible, but she doesn't want to take off the dressings because she is afraid she will make pt's wounds worse. I told Hoyle Sauer that was understandable and I would inform [place] and Dr. Amalia Hailey of her concerns and to leave the dressings in place.

## 2019-11-24 NOTE — Telephone Encounter (Signed)
I informed [facility] of Carolyn's concerns and the pt would present to surgical center in dressings. [facility] states she will inform the triage nurse.

## 2019-11-24 NOTE — Progress Notes (Signed)
PRN postop 

## 2019-11-24 NOTE — Telephone Encounter (Signed)
Pt's wife, Hoyle Sauer called crying and wanted to speak with the nurse prior to pt's surgery today.

## 2019-11-28 ENCOUNTER — Other Ambulatory Visit: Payer: Self-pay

## 2019-11-28 ENCOUNTER — Encounter (HOSPITAL_BASED_OUTPATIENT_CLINIC_OR_DEPARTMENT_OTHER): Payer: Medicare HMO | Attending: Internal Medicine | Admitting: Internal Medicine

## 2019-11-28 DIAGNOSIS — M86672 Other chronic osteomyelitis, left ankle and foot: Secondary | ICD-10-CM | POA: Insufficient documentation

## 2019-11-28 DIAGNOSIS — S91101A Unspecified open wound of right great toe without damage to nail, initial encounter: Secondary | ICD-10-CM | POA: Diagnosis not present

## 2019-11-28 DIAGNOSIS — L97524 Non-pressure chronic ulcer of other part of left foot with necrosis of bone: Secondary | ICD-10-CM | POA: Diagnosis not present

## 2019-11-28 DIAGNOSIS — I482 Chronic atrial fibrillation, unspecified: Secondary | ICD-10-CM | POA: Diagnosis not present

## 2019-11-28 DIAGNOSIS — L97211 Non-pressure chronic ulcer of right calf limited to breakdown of skin: Secondary | ICD-10-CM | POA: Insufficient documentation

## 2019-11-28 DIAGNOSIS — L97511 Non-pressure chronic ulcer of other part of right foot limited to breakdown of skin: Secondary | ICD-10-CM | POA: Insufficient documentation

## 2019-11-28 DIAGNOSIS — M109 Gout, unspecified: Secondary | ICD-10-CM | POA: Insufficient documentation

## 2019-11-28 DIAGNOSIS — Z7901 Long term (current) use of anticoagulants: Secondary | ICD-10-CM | POA: Insufficient documentation

## 2019-11-28 DIAGNOSIS — I87333 Chronic venous hypertension (idiopathic) with ulcer and inflammation of bilateral lower extremity: Secondary | ICD-10-CM | POA: Diagnosis not present

## 2019-11-28 DIAGNOSIS — S91105A Unspecified open wound of left lesser toe(s) without damage to nail, initial encounter: Secondary | ICD-10-CM | POA: Diagnosis not present

## 2019-11-28 DIAGNOSIS — I87312 Chronic venous hypertension (idiopathic) with ulcer of left lower extremity: Secondary | ICD-10-CM | POA: Diagnosis not present

## 2019-11-28 DIAGNOSIS — I5022 Chronic systolic (congestive) heart failure: Secondary | ICD-10-CM | POA: Diagnosis not present

## 2019-11-28 DIAGNOSIS — I110 Hypertensive heart disease with heart failure: Secondary | ICD-10-CM | POA: Insufficient documentation

## 2019-11-28 DIAGNOSIS — I872 Venous insufficiency (chronic) (peripheral): Secondary | ICD-10-CM | POA: Diagnosis not present

## 2019-11-28 DIAGNOSIS — L97822 Non-pressure chronic ulcer of other part of left lower leg with fat layer exposed: Secondary | ICD-10-CM | POA: Diagnosis not present

## 2019-11-28 DIAGNOSIS — Z8546 Personal history of malignant neoplasm of prostate: Secondary | ICD-10-CM | POA: Diagnosis not present

## 2019-11-28 DIAGNOSIS — E1142 Type 2 diabetes mellitus with diabetic polyneuropathy: Secondary | ICD-10-CM | POA: Diagnosis not present

## 2019-11-28 DIAGNOSIS — I890 Lymphedema, not elsewhere classified: Secondary | ICD-10-CM | POA: Diagnosis not present

## 2019-11-28 DIAGNOSIS — E1151 Type 2 diabetes mellitus with diabetic peripheral angiopathy without gangrene: Secondary | ICD-10-CM | POA: Diagnosis not present

## 2019-11-28 DIAGNOSIS — L97221 Non-pressure chronic ulcer of left calf limited to breakdown of skin: Secondary | ICD-10-CM | POA: Diagnosis not present

## 2019-11-28 NOTE — Progress Notes (Signed)
[name], [name] ([id number]) Visit Report for 11/28/2019 HPI Details Patient Name: Date of Service: [name], [name] 11/28/2019 8:00 AM Medical Record [name] Patient Account Number: [id number] Date of Birth/Sex: Treating RN: 1941-04-18 (78 y.o. [name]) [name] Primary Care Provider: Hurshel Party Other Clinician: Referring Provider: Treating Provider/Extender:Jessenya Berdan, Cheryl Flash, [place] Weeks in Treatment: 42 History of Present Illness Location: Patient presents with a wound to left lower leg. Quality: Patient reports No Pain. Duration: 2 months HPI Description: no cig or alcohol. spontaneous appearance in area of stasis dermamtitis. [name] on metformin only. chronic afib on Coumadin. diabetes and coag studies not good. hba1c 7.5. ivr 4.5. no pain or sxs of systemic disease. hx chf. no intermittent claudication 02/28/2019 Readmission This is a now a 79 year old man who was previously cared for in 2016 by Dr. Lindon Romp for wounds on his lower extremities. At that point he had venous reflux studies although I cannot seem to open these in [place] link. He had arterial studies showing an ABI of 1.11 on the right and 1.27 on the left his waveforms were triphasic bilaterally. He was discharged in stockings although I do not believe he is wearing these in some time. He tells me that about a month ago he noted openings of a large wound on the posterior right calf and 2 smaller areas on the left lateral calf and a small area more recently on the left posterior calf. He has been dressing these with peroxide and triple antibiotic ointment. He is not wearing compression. Past medical history; type 2 diabetes with peripheral neuropathy, chronic venous insufficiency, hypertension, cardiomyopathy, chronic atrial fibrillation on Coumadin, prostate cancer, hyperlipidemia, gout, ABI in our clinic was 1.34 on the left and not obtainable on the right 6/9; this is a patient who has chronic  venous insufficiency. He has a fairly substantial area on the right posterior calf, left lateral calf and a small area on the left posterior calf. On arrival last week he had very palpable popliteal and femoral pulses but nothing in his bilateral feet. Unfortunately we cannot get arterial studies until July 1 at Dr. Kennon Holter office. They live in [place]. We use silver alginate under Kerlix Coban 6/16; patient with chronic venous insufficiency with wounds on his bilateral lower extremities. When he came into our clinic he was discovered to have a complete absence of peripheral pedal pulses at either the dorsalis pedis or posterior tibial. He does have easily palpable femoral and popliteal pulses. He sees Dr. [name] tomorrow for noninvasive arterial tests. He may also require venous reflux evaluation although I do not view this as an urgent thing. We have been using silver alginate. His wound surfaces of cleaned up quite nicely 6/23; patient with chronic venous insufficiency with wounds on his bilateral lower extremities. His wounds all are somewhat better looking. He did go to Dr. Kennon Holter office but somehow ended up on the doctors schedule rather than being scheduled for noninvasive tests therefore his noninvasive tests are scheduled for July 1. We agree that he has venous insufficiency ulcers but I cannot feel any pulses in his lower extremities dictating the need for test. We are only using Kerlix and light Coban unfortunately this appears to be holding the edema 6/30; has his arterial studies tomorrow. We have been using Kerlix and light Coban will go to a more aggressive compression if the arterial studies will allow. We all agreed these are venous wounds however I cannot feel pulses at either the dorsalis pedis or posterior tibial  bilaterally. His wounds generally look some better including left lateral and right posterior. 7/7-Patient returns at 1 week in Kerlix/Coban to both legs, with  improvement, in the left lateral and right posterior lower leg wounds, ABI's are normal in both legs per vascular studies, TBI is also normal on both sides, we are using hydrofera blue to the wounds 7/14; patient's arterial studies from 2 weeks ago showed an ABI on the right at 1.03 with a TBI of 0.86. On the left the ABI was 1.06 with a TBI of 0.84. Notable for the fact that his arterial waveforms were monophasic in all of the lower extremity arteries suggesting some degree of arterial occlusive disease but in general this was felt to be fairly adequate for healing. His compression was increased from 2-3 layer which is appropriate. Dressing was changed to [facility] 7/21; patient's wounds are measuring smaller. The more substantial one on the right posterior calf, second 1 on the left lateral calf. Using [facility] on both wound areas 7/28; patient continues to make nice improvements. The area on the right posterior calf is smaller. Area on the left lateral calf also is smaller. We have been using Hydrofera Blue under compression. The patient will need compression stockings and we have measured him for these in the eventuality that these heal which really should not be too long from now 8/4-Patient continues to make improvement, the right posterior calf area smaller with rim of keratotic skin on one side, the left wound is definitely smaller and improving. 8/11-Returns at 1 week, after being in 3 layer compression on both legs, both wounds appear to be improving, making good progress, patient is happy, pain is also less especially in the right leg wound 8/18; the area on the left anterior lower leg is healed. On the right posterior leg the wound remains although the dimensions are a lot better. 8/25; he arrives in clinic today with a large body of open wound on the left lateral calf. All of the 3 wounds in this area are in close juxtaposition to each other. The story is that we  discharged him last week with no a wrap on the left leg. They went to [facility] could not get in as they are only excepting phone orders or online orders for stockings hence they did not put any stocking on the left leg all week. They have something at home but the patient with that was either incapable or just did not put them on. Apparently these opened 1 morning after getting out of bed. The area on the right has no real change 9/1; patient has bilateral lower extremity wounds in the setting of severe chronic venous insufficiency and secondary lymphedema. He arrived last week with new areas on the left lateral lower leg after we did not wrap him and he did not use his stockings. Nevertheless the areas on the left look better today under compression. Posterior right calf does not really changed. We are using Hydrofera Blue on both areas under compression 9/15; bilateral lower extremity wounds in the setting of severe chronic venous insufficiency and secondary lymphedema. He has 20 to 30 mmHg below-knee compression stockings under the eventuality that these close over. We did get the left leg to close but he did not transition to a stocking and this reopened. There are 2 open areas on the left posterior lateral calf and one on the right. Both of these look satisfactory. Using [facility] 9/22; bilateral lower extremity wounds in the  setting of chronic venous insufficiency. 2 superficial areas on the left lateral calf. One on the right just above the Achilles area. We have good edema control we have been using Hydrofera Blue 9/29; the areas on the left lateral calf are healed. On the right just above the Achilles and tendon area things look a lot better small wounds one scabbed area. We have been using Hydrofera Blue. We can discharge him in his own stocking on the left still wrapping on the right. This is the second time we have healed the left leg but he did not put a stocking on last time.  Hopefully this will maintain the edema from chronic venous disease with secondary lymphedema 10/6; he comes in today having a stocking on the left leg. They had trouble getting it on there is a lot of increase in swelling 2 small open areas one anteriorly and one on the medial calf. They report a lot of difficulty getting the stocking on. Paradoxically the area on the right that we have been wrapping posteriorly is closed 10/13; he comes in today with wounds bilaterally including superficial areas on the left medial and left lateral calf. As well as the right posterior has reopened in the Achilles area superiorly. He still does not have his juxta lite stockings although truthfully we would not of been able to use them today anyway. Apparently have been ordered and paid for from [employer] although they have not been delivered 10/20; his area on the right is just the boat closed on the right posterior. Still has the area on the left lateral and a very tiny area on the left medial. He has his bilateral juxta lites although he is not ready for them this week. He tolerated the increase to 4 layer compression last week quite well 10/27; the area on the right posterior calf is once again closed. He has a superficial area on the left lateral calf that is still open. He has been using Hydrofera Blue and bilateral 4-layer compression. He can change to his own juxta lite stocking on the right and we are instructing him today 11/3; the area on the right posterior calf reopened according to the patient and his wife after they took off the stocking when they got home last week. Apparently scabbed over there is now a fairly substantial wound which looks pretty much the same. Our intake nurse noted that they were using the juxta lite stockings appropriately. I was really hoping I might be able to close him out today. He has 1 very tiny remaining area on the left lateral lower leg. 11/10; right posterior calf wound  measures smaller but is still open. We have been using Hydrofera Blue. On the left he has a small oval-shaped wound and he seems to have had another wound distally that is open and likely a blister. We are using Hydrofera Blue under compression 11/17; right posterior calf wound continues to get better. We have been using Hydrofera Blue. On the left lateral one of the wounds has closed still a small open area. We have been using Hydrofera Blue on this as well. Both areas have been under 4-layer compression Arrives in clinic today with some swelling in the dorsal foot on the right some erythema of his forefoot and toes. Initially when I looked at this I almost thought this was a sunburn distal to a wrap injury. 12/1; right posterior calf wound debrided with a curette. We have been using Hydrofera Blue on the  left anterior lateral he has an area across the mid tibia. Finally a small area on the left lateral lower calf. Finally he continues to have de-epithelialized areas on the dorsal aspect of his toes. Initially thought this might be a burn injury when I saw him 2 weeks ago. I now wonder about tinea. I have also reviewed his arterial studies which were really quite good in July/20 with normal TBI's and ABIs but monophasic waveforms 12/8; comes in today with worsening problems especially on the left leg where he now has a cluster of wounds in the left anterior mid tibia. Very poor edema control. I reduced him to 3 layer from 4 layer compression last week because of some concern about blood flow to his toes however he does not have good edema control on the left leg. Right leg edema control looks satisfactory. On the left he has a cluster of wounds anteriorly, small area on the left medial fifth met head and then the collection of areas on his toes which appear better On the right he has the original area on the right posterior calf, a new area right medially. His formal arterial studies from mid  July are noted below. He was evaluated by Dr.Berry ABI Findings: +---------+------------------+-----+----------+--------+ Right Rt Pressure (mmHg)IndexWaveform Comment  +---------+------------------+-----+----------+--------+ Brachial 176     +---------+------------------+-----+----------+--------+ ATA 176 0.99 monophasic  +---------+------------------+-----+----------+--------+ PTA 183 1.03 monophasic  +---------+------------------+-----+----------+--------+ PERO 172 0.97 monophasic  +---------+------------------+-----+----------+--------+ Great Toe153 0.86    +---------+------------------+-----+----------+--------+ +---------+------------------+-----+-----------+-------+ Left Lt Pressure (mmHg)IndexWaveform Comment +---------+------------------+-----+-----------+-------+ Brachial 178     +---------+------------------+-----+-----------+-------+ ATA 162 0.91 multiphasic  +---------+------------------+-----+-----------+-------+ PTA 188 1.06 multiphasic  +---------+------------------+-----+-----------+-------+ PERO 158 0.89 monophasic   +---------+------------------+-----+-----------+-------+ Great Toe150 0.84    +---------+------------------+-----+-----------+-------+ +-------+-----------+-----------+------------+------------+ ABI/TBIToday's ABIToday's TBIPrevious ABIPrevious TBI +-------+-----------+-----------+------------+------------+ Right 1.03 0.86 1.11   +-------+-----------+-----------+------------+------------+ Left 1.06 0.84 1.27   +-------+-----------+-----------+------------+------------+ Tibial waveforms somewhat difficult to record due to irregular heartbeat. Bilateral ABIs appear essentially unchanged compared to prior study on 06/21/15. Summary: Right: Resting right ankle-brachial index is within normal range. No evidence of significant right lower extremity arterial disease.  The right toe-brachial index is normal. Although ankle brachial indices are within normal limits (0.95-1.29), arterial Doppler waveforms at the ankle suggest some component of arterial occlusive disease. Left: Resting left ankle-brachial index is within normal range. No evidence of significant left lower extremity arterial disease. The left toe-brachial index is normal. Although ankle brachial indices are within normal limits (0.95-1.29), arterial Doppler waveforms at the ankle suggest some component of arterial occlusive disease. 12/15; the patient's area on the left mid tibia looks better. Right posterior calf also better. He has the area on the left foot as well. All of his toes look better I think this was tinea. We are using Hydrofera Blue everywhere else The patient was in urgent care yesterday with wheezing and shortness of breath. He got azithromycin and prednisone. He feels better. His lungs are currently clear to auscultation. He was not tested for Covid 19 12/29; the patient arrives in clinic today with quite a bit change. 2 weeks ago he only had areas on the left mid tibia right posterior calf with tinea pedis resolving between his toes. He arrives in clinic today with several areas on the dorsal toes on the right, dorsal left second toe. He has skin breakdown in the left medial calf probably from excess edema. Small area proximally in the medial calf. He has weeping edema fluid coming out of the skin excoriations on the left medial calf. He tells  me that he is having a cardiac catheterization next week. I had a quick look at [facility] health link. He was found to have an ejection fraction of 25% during the work-up for persistent atrial fibrillation. He saw his cardiology office yesterday seen by the nurse practitioner. She increased his carvedilol. He has not been on diuretics for apparently several months and indeed in the nurse practitioner Dietrich Pates notes she had knowledge of  this. 10/04/2019 on evaluation today patient presents as a walk-in visit concerning the fact that he did not have an appointment here for our clinic at this point. He actually had a cardiac catheterization earlier today and then came from there to here in order to be evaluated. With that being said unfortunately he is having significant cellulitis of his left lower extremity upon evaluation today this appears to have deteriorated even since last week's evaluation with Dr. Dellia Nims. The right lower extremity is actually doing okay I really see no evidence of deterioration at this point at those locations. In fact the right leg seems in general be doing quite well. Nonetheless I am concerned about infection and cellulitis of the left lower extremity and again considering his weakened heart I do not want him to develop into sepsis at all. He is also having some trouble breathing today and I understand according to nursing staff this is always the case to some degree. With that being said the patient unfortunately seems to be in my opinion a little bit worse even his wife feels like that may be the case today. Unsure exactly what is leading to this. Cardiac catheterization I did review the report which showed an ejection fraction of 25% he also had an LAD blockage of around 25% based on what I saw. With that being said there was no significant blockages that required stenting at this point he does have weakened cardiac muscles compared to normal. 10/05/2019; patient was seen yesterday in clinic. He was sent to the ER because of cellulitis of the left leg possibility. In the ER he was given 1 dose of IV Levaquin and discharged on Keflex. He came in the clinic initially for a nurse visit to rewrap his left leg. We did not look at the right leg today that is an Haematologist. The patient also had a cardiac cath. According to him there were no blockages but a very low ejection fraction. 1/12; back for an early  follow-up. The condition of the left lower leg is a lot better although there are multiple open areas. All of them with not a particularly viable surface. On the right posterior calf he has a single wound with a clean surface. He has a wound with exposed bone on the PIP of the left second toe dorsally. He has wounds on the dorsal right first second and third toes. His arterial studies were normal. 1/19; the patient has 3 open wounds on the left leg anteriorly in the mid tibia, distally and medially just above the ankle and posteriorly. On the right he has a small area dime sized on the right posterior calf. His edema control is a lot better. He still has wounds on the right second and left second toes. The left second toe has exposed bone. X- ray I did last week did not show evidence of osteomyelitis in the left foot. 1/26; patient with a multiplicity of wounds and problems. On the left he has a circular area on the left anterior mid tibia Left just above  the medical ankle -left 2nd toe pip -right 2nd toe right posterior calf 2/2; patient with a multiplicity of wounds and problems. He has severe chronic venous insufficiency and the wounds on his leg are all on the left left anterior left medial at the medial malleolus and left posterior. We have been using Iodoflex to these areas to help with ongoing debridement. He also has largely traumatic wounds on his toes this includes the left second with exposed bone. Bone culture I did last week showed Staph aureus which is methicillin sensitive. I discussed with him today the idea of an amputation of this toe because it is literally nonfunctional however he wants to try antibiotics. Antibiotic choice is complicated by the fact that he is on Coumadin. He also has wounds over the dorsal part of the right first which is close to closed. Right second toe has exposed bone and the right third toe at the base of the right third toe is just about closed as  well. We have been using silver alginate 2/9; patient with severe chronic venous insufficiency. He has large wounds on the left anterior mid tibia and on the left medial just above the ankle. I am concerned today about the depth of the area on the left mid tibia may be exposing into the muscle. He has small areas on the left posterior calf and on the right posterior calf although these do not look as threatening. He also has traumatic wounds on his toes. The right first and third are closed however the bilateral second toes have exposed bone. The area on the left is open into the distal interphalangeal joint. In my opinion these toes are nonsalvageable and will need amputation 2/16; patient with severe chronic venous insufficiency. He has wounds on the left anterior mid tibia left medial lower leg just above the ankle. He has small areas on the left posterior calf and a more prominent area on the right posterior calf. All of these are related to poorly controlled venous hypertension. He also had traumatic wounds on the PIPs of both second toes. Unfortunately both of these have exposed bone and in the case of the left this I think goes right into the joint itself. I do not think either 1 of these is salvageable. I have reviewed his arterial evaluation that was done in July last year. He also saw Dr. [name] in June. He felt these were venous stasis. Previously had segmental arterial pressures performed in 11/03/2014 which were normal. He was sent for for a follow-up arterial evaluation on July 1. On the right his ABIs were 1.03 with a TBI of 0.86 although his waveforms were monophasic on the left he had multiphasic waveforms at the ATA PTA but monophasic at the peroneal nevertheless his ABI was normal at 1.06 TBI also normal at 0.84. I think he has enough blood flow to heal an amputation which I think he needs of the left second toe and probably the right second toe as well 2/23; the patient is going  for his bilateral second toe amputation by Dr. Amalia Hailey on Friday. In the meantime is 3 areas on the left leg and the small area on the right posterior look better. We have been using silver collagen. 3/2; the patient's toe amputations were canceled because of cardiac concerns. Apparently this surgery will need to be done in the hospital although they do not have an appointment. In the meantime he is continues to have 3 areas on the left leg  one anteriorly and 2 smaller areas posteriorly on the left as well as the small posterior area on the right. We have been using silver collagen with compression Electronic Signature(s) Signed: 11/28/2019 5:14:23 PM By: [name] MD Entered By: [name] on 11/28/2019 09:09:44 -------------------------------------------------------------------------------- Physical Exam Details Patient Name: Date of Service: Daniel Pia D. 11/28/2019 8:00 AM Medical Record [id number] Patient Account Number: [id number] Date of Birth/Sex: Treating RN: 1941-08-23 (78 y.o. [name]) [name] Primary Care Provider: Hurshel Party Other Clinician: Referring Provider: Treating Provider/Extender:Maliya Marich, Cheryl Flash, [place] Weeks in Treatment: 37 Constitutional Sitting or standing Blood Pressure is within target range for patient.. Pulse regular and within target range for patient.Marland Kitchen Respirations regular, non-labored and within target range.. Temperature is normal and within the target range for the patient.Marland Kitchen Appears in no distress. Cardiovascular Pedal pulses are palpable. Edema control is good. Notes Wound exam; left anterior leg wound, the 2 areas on the left posterior and the area on the right posterior all look about the same varying degrees of surface debris although I did not do a mechanical debridement all the wounds were vigorously washed with Anasept and gauze they look like they are making progress The left anterior second toe actually looks smaller  and there is less exposed bone I did previously debrided this area. He has exposed bone protruding out of the PIP of the right second toe. Nothing appears to be ominous here Electronic Signature(s) Signed: 11/28/2019 5:14:23 PM By: [name] MD Entered By: [name] on 11/28/2019 09:11:33 -------------------------------------------------------------------------------- Physician Orders Details Patient Name: Date of Service: Daniel Pia D. 11/28/2019 8:00 AM Medical Record [id number] Patient Account Number: [id number] Date of Birth/Sex: Treating RN: 1941-06-06 (78 y.o. [name]) [name] Primary Care Provider: Hurshel Party Other Clinician: Referring Provider: Treating Provider/Extender:Adrionna Delcid, Cheryl Flash, [place] Weeks in Treatment: 70 Verbal / Phone Orders: No Diagnosis Coding ICD-10 Coding Code Description L97.211 Non-pressure chronic ulcer of right calf limited to breakdown of skin L97.221 Non-pressure chronic ulcer of left calf limited to breakdown of skin I87.333 Chronic venous hypertension (idiopathic) with ulcer and inflammation of bilateral lower extremity E11.51 Type 2 diabetes mellitus with diabetic peripheral angiopathy without gangrene E11.42 Type 2 diabetes mellitus with diabetic polyneuropathy L97.524 Non-pressure chronic ulcer of other part of left foot with necrosis of bone L97.511 Non-pressure chronic ulcer of other part of right foot limited to breakdown of skin V78.46 Chronic systolic (congestive) heart failure N62.952 Other chronic osteomyelitis, left ankle and foot Follow-up Appointments Return Appointment in 1 week. - Tuesday **********EXTRA TIME**************** Dressing Change Frequency Wound #13 Left Toe Second Change dressing every day. Wound #19 Left,Anterior Lower Leg Do not change entire dressing for one week. Wound #20 Right Toe Great Change dressing every day. Wound #21 Right Toe Second Change dressing every day. Wound #24  Left,Distal,Anterior Lower Leg Do not change entire dressing for one week. Wound #25 Left,Distal,Posterior Lower Leg Do not change entire dressing for one week. Wound #3R Right,Posterior Calf Do not change entire dressing for one week. Skin Barriers/Peri-Wound Care TCA Cream or Ointment Wound Cleansing May shower with protection. Primary Wound Dressing Wound #13 Left Toe Second Calcium Alginate with Silver Wound #19 Left,Anterior Lower Leg Silver Collagen Wound #20 Right Toe Great Calcium Alginate with Silver Wound #21 Right Toe Second Calcium Alginate with Silver Wound #24 Left,Distal,Anterior Lower Leg Silver Collagen Wound #25 Left,Distal,Posterior Lower Leg Silver Collagen Wound #3R Right,Posterior Calf Silver Collagen Secondary Dressing Dry Gauze - secure toes with tape Edema Control Unna Boots Bilaterally Off-Loading  Open toe surgical shoe to: - left and right foot Electronic Signature(s) Signed: 11/28/2019 9:14:02 AM By: [name] RN Signed: 11/28/2019 5:14:23 PM By: [name] MD Entered By: [name] on 11/28/2019 08:01:23 -------------------------------------------------------------------------------- Problem List Details Patient Name: Date of Service: Daniel Pia D. 11/28/2019 8:00 AM Medical Record [id number] Patient Account Number: [id number] Date of Birth/Sex: Treating RN: Feb 23, 1941 (78 y.o. [name]) Dolores Lory, [place] Primary Care Provider: Hurshel Party Other Clinician: Referring Provider: Treating Provider/Extender:Esaias Cleavenger, Cheryl Flash, [place] Weeks in Treatment: 39 Active Problems ICD-10 Evaluated Encounter Code Description Active Date Today Diagnosis L97.211 Non-pressure chronic ulcer of right calf limited to 02/28/2019 No Yes breakdown of skin L97.221 Non-pressure chronic ulcer of left calf limited to 02/28/2019 No Yes breakdown of skin I87.333 Chronic venous hypertension (idiopathic) with ulcer 02/28/2019 No Yes and inflammation of  bilateral lower extremity E11.51 Type 2 diabetes mellitus with diabetic peripheral 02/28/2019 No Yes angiopathy without gangrene E11.42 Type 2 diabetes mellitus with diabetic polyneuropathy 02/28/2019 No Yes L97.524 Non-pressure chronic ulcer of other part of left foot 10/10/2019 No Yes with necrosis of bone L97.511 Non-pressure chronic ulcer of other part of right foot 09/26/2019 No Yes limited to breakdown of skin Q75.91 Chronic systolic (congestive) heart failure 10/05/2019 No Yes M86.672 Other chronic osteomyelitis, left ankle and foot 10/31/2019 No Yes Inactive Problems ICD-10 Code Description Active Date Inactive Date B35.3 Tinea pedis 09/05/2019 09/05/2019 L97.521 Non-pressure chronic ulcer of other part of left foot limited to 09/26/2019 09/26/2019 breakdown of skin Resolved Problems Electronic Signature(s) Signed: 11/28/2019 5:14:23 PM By: [name] MD Entered By: [name] on 11/28/2019 09:08:39 -------------------------------------------------------------------------------- Progress Note Details Patient Name: Date of Service: Daniel Pia D. 11/28/2019 8:00 AM Medical Record [id number] Patient Account Number: [id number] Date of Birth/Sex: Treating RN: 1941-05-11 (78 y.o. [name]) [name] Primary Care Provider: Hurshel Party Other Clinician: Referring Provider: Treating Provider/Extender:Coree Riester, Cheryl Flash, [place] Weeks in Treatment: 39 Subjective History of Present Illness (HPI) The following HPI elements were documented for the patient's wound: Location: Patient presents with a wound to left lower leg. Quality: Patient reports No Pain. Duration: 2 months no cig or alcohol. spontaneous appearance in area of stasis dermamtitis. [name] on metformin only. chronic afib on Coumadin. diabetes and coag studies not good. hba1c 7.5. ivr 4.5. no pain or sxs of systemic disease. hx chf. no intermittent claudication 02/28/2019 Readmission This is a now a 79 year old  man who was previously cared for in 2016 by Dr. Lindon Romp for wounds on his lower extremities. At that point he had venous reflux studies although I cannot seem to open these in  link. He had arterial studies showing an ABI of 1.11 on the right and 1.27 on the left his waveforms were triphasic bilaterally. He was discharged in stockings although I do not believe he is wearing these in some time. He tells me that about a month ago he noted openings of a large wound on the posterior right calf and 2 smaller areas on the left lateral calf and a small area more recently on the left posterior calf. He has been dressing these with peroxide and triple antibiotic ointment. He is not wearing compression. Past medical history; type 2 diabetes with peripheral neuropathy, chronic venous insufficiency, hypertension, cardiomyopathy, chronic atrial fibrillation on Coumadin, prostate cancer, hyperlipidemia, gout, ABI in our clinic was 1.34 on the left and not obtainable on the right 6/9; this is a patient who has chronic venous insufficiency. He has a fairly substantial area on the right posterior calf, left  lateral calf and a small area on the left posterior calf. On arrival last week he had very palpable popliteal and femoral pulses but nothing in his bilateral feet. Unfortunately we cannot get arterial studies until July 1 at Dr. Kennon Holter office. They live in [place]. We use silver alginate under Kerlix Coban 6/16; patient with chronic venous insufficiency with wounds on his bilateral lower extremities. When he came into our clinic he was discovered to have a complete absence of peripheral pedal pulses at either the dorsalis pedis or posterior tibial. He does have easily palpable femoral and popliteal pulses. He sees Dr. [name] tomorrow for noninvasive arterial tests. He may also require venous reflux evaluation although I do not view this as an urgent thing. We have been using silver alginate. His  wound surfaces of cleaned up quite nicely 6/23; patient with chronic venous insufficiency with wounds on his bilateral lower extremities. His wounds all are somewhat better looking. He did go to Dr. Kennon Holter office but somehow ended up on the doctors schedule rather than being scheduled for noninvasive tests therefore his noninvasive tests are scheduled for July 1. We agree that he has venous insufficiency ulcers but I cannot feel any pulses in his lower extremities dictating the need for test. We are only using Kerlix and light Coban unfortunately this appears to be holding the edema 6/30; has his arterial studies tomorrow. We have been using Kerlix and light Coban will go to a more aggressive compression if the arterial studies will allow. We all agreed these are venous wounds however I cannot feel pulses at either the dorsalis pedis or posterior tibial bilaterally. His wounds generally look some better including left lateral and right posterior. 7/7-Patient returns at 1 week in Kerlix/Coban to both legs, with improvement, in the left lateral and right posterior lower leg wounds, ABI's are normal in both legs per vascular studies, TBI is also normal on both sides, we are using hydrofera blue to the wounds 7/14; patient's arterial studies from 2 weeks ago showed an ABI on the right at 1.03 with a TBI of 0.86. On the left the ABI was 1.06 with a TBI of 0.84. Notable for the fact that his arterial waveforms were monophasic in all of the lower extremity arteries suggesting some degree of arterial occlusive disease but in general this was felt to be fairly adequate for healing. His compression was increased from 2-3 layer which is appropriate. Dressing was changed to [facility] 7/21; patient's wounds are measuring smaller. The more substantial one on the right posterior calf, second 1 on the left lateral calf. Using [facility] on both wound areas 7/28; patient continues to make nice  improvements. The area on the right posterior calf is smaller. Area on the left lateral calf also is smaller. We have been using Hydrofera Blue under compression. The patient will need compression stockings and we have measured him for these in the eventuality that these heal which really should not be too long from now 8/4-Patient continues to make improvement, the right posterior calf area smaller with rim of keratotic skin on one side, the left wound is definitely smaller and improving. 8/11-Returns at 1 week, after being in 3 layer compression on both legs, both wounds appear to be improving, making good progress, patient is happy, pain is also less especially in the right leg wound 8/18; the area on the left anterior lower leg is healed. On the right posterior leg the wound remains although the dimensions are a  lot better. 8/25; he arrives in clinic today with a large body of open wound on the left lateral calf. All of the 3 wounds in this area are in close juxtaposition to each other. The story is that we discharged him last week with no a wrap on the left leg. They went to [facility] could not get in as they are only excepting phone orders or online orders for stockings hence they did not put any stocking on the left leg all week. They have something at home but the patient with that was either incapable or just did not put them on. Apparently these opened 1 morning after getting out of bed. The area on the right has no real change 9/1; patient has bilateral lower extremity wounds in the setting of severe chronic venous insufficiency and secondary lymphedema. He arrived last week with new areas on the left lateral lower leg after we did not wrap him and he did not use his stockings. Nevertheless the areas on the left look better today under compression. Posterior right calf does not really changed. We are using Hydrofera Blue on both areas under compression 9/15; bilateral lower extremity  wounds in the setting of severe chronic venous insufficiency and secondary lymphedema. He has 20 to 30 mmHg below-knee compression stockings under the eventuality that these close over. We did get the left leg to close but he did not transition to a stocking and this reopened. There are 2 open areas on the left posterior lateral calf and one on the right. Both of these look satisfactory. Using [facility] 9/22; bilateral lower extremity wounds in the setting of chronic venous insufficiency. 2 superficial areas on the left lateral calf. One on the right just above the Achilles area. We have good edema control we have been using Hydrofera Blue 9/29; the areas on the left lateral calf are healed. On the right just above the Achilles and tendon area things look a lot better small wounds one scabbed area. We have been using Hydrofera Blue. We can discharge him in his own stocking on the left still wrapping on the right. This is the second time we have healed the left leg but he did not put a stocking on last time. Hopefully this will maintain the edema from chronic venous disease with secondary lymphedema 10/6; he comes in today having a stocking on the left leg. They had trouble getting it on there is a lot of increase in swelling 2 small open areas one anteriorly and one on the medial calf. They report a lot of difficulty getting the stocking on. ooParadoxically the area on the right that we have been wrapping posteriorly is closed 10/13; he comes in today with wounds bilaterally including superficial areas on the left medial and left lateral calf. As well as the right posterior has reopened in the Achilles area superiorly. He still does not have his juxta lite stockings although truthfully we would not of been able to use them today anyway. Apparently have been ordered and paid for from [employer] although they have not been delivered 10/20; his area on the right is just the boat closed on the right  posterior. Still has the area on the left lateral and a very tiny area on the left medial. He has his bilateral juxta lites although he is not ready for them this week. He tolerated the increase to 4 layer compression last week quite well 10/27; the area on the right posterior calf is once again  closed. He has a superficial area on the left lateral calf that is still open. He has been using Hydrofera Blue and bilateral 4-layer compression. He can change to his own juxta lite stocking on the right and we are instructing him today 11/3; the area on the right posterior calf reopened according to the patient and his wife after they took off the stocking when they got home last week. Apparently scabbed over there is now a fairly substantial wound which looks pretty much the same. Our intake nurse noted that they were using the juxta lite stockings appropriately. I was really hoping I might be able to close him out today. He has 1 very tiny remaining area on the left lateral lower leg. 11/10; right posterior calf wound measures smaller but is still open. We have been using Hydrofera Blue. On the left he has a small oval-shaped wound and he seems to have had another wound distally that is open and likely a blister. We are using Hydrofera Blue under compression 11/17; right posterior calf wound continues to get better. We have been using Hydrofera Blue. On the left lateral one of the wounds has closed still a small open area. We have been using Hydrofera Blue on this as well. Both areas have been under 4-layer compression Arrives in clinic today with some swelling in the dorsal foot on the right some erythema of his forefoot and toes. Initially when I looked at this I almost thought this was a sunburn distal to a wrap injury. 12/1; right posterior calf wound debrided with a curette. We have been using Hydrofera Blue on the left anterior lateral he has an area across the mid tibia. Finally a small area on  the left lateral lower calf. Finally he continues to have de-epithelialized areas on the dorsal aspect of his toes. Initially thought this might be a burn injury when I saw him 2 weeks ago. I now wonder about tinea. I have also reviewed his arterial studies which were really quite good in July/20 with normal TBI's and ABIs but monophasic waveforms 12/8; comes in today with worsening problems especially on the left leg where he now has a cluster of wounds in the left anterior mid tibia. Very poor edema control. I reduced him to 3 layer from 4 layer compression last week because of some concern about blood flow to his toes however he does not have good edema control on the left leg. Right leg edema control looks satisfactory. ooOn the left he has a cluster of wounds anteriorly, small area on the left medial fifth met head and then the collection of areas on his toes which appear better ooOn the right he has the original area on the right posterior calf, a new area right medially. His formal arterial studies from mid July are noted below. He was evaluated by Dr.Berry ABI Findings: +---------+------------------+-----+----------+--------+ Right Rt Pressure (mmHg)IndexWaveform Comment  +---------+------------------+-----+----------+--------+ Brachial 176     +---------+------------------+-----+----------+--------+ ATA 176 0.99 monophasic  +---------+------------------+-----+----------+--------+ PTA 183 1.03 monophasic  +---------+------------------+-----+----------+--------+ PERO 172 0.97 monophasic  +---------+------------------+-----+----------+--------+ Great Toe153 0.86    +---------+------------------+-----+----------+--------+ +---------+------------------+-----+-----------+-------+ Left Lt Pressure (mmHg)IndexWaveform Comment +---------+------------------+-----+-----------+-------+ Brachial 178      +---------+------------------+-----+-----------+-------+ ATA 162 0.91 multiphasic  +---------+------------------+-----+-----------+-------+ PTA 188 1.06 multiphasic  +---------+------------------+-----+-----------+-------+ PERO 158 0.89 monophasic   +---------+------------------+-----+-----------+-------+ Great Toe150 0.84    +---------+------------------+-----+-----------+-------+ +-------+-----------+-----------+------------+------------+ ABI/TBIToday's ABIToday's TBIPrevious ABIPrevious TBI +-------+-----------+-----------+------------+------------+ Right 1.03 0.86 1.11   +-------+-----------+-----------+------------+------------+ Left 1.06 0.84 1.27   +-------+-----------+-----------+------------+------------+ Tibial waveforms somewhat difficult to record  due to irregular heartbeat. Bilateral ABIs appear essentially unchanged compared to prior study on 06/21/15. Summary: Right: Resting right ankle-brachial index is within normal range. No evidence of significant right lower extremity arterial disease. The right toe-brachial index is normal. Although ankle brachial indices are within normal limits (0.95-1.29), arterial Doppler waveforms at the ankle suggest some component of arterial occlusive disease. Left: Resting left ankle-brachial index is within normal range. No evidence of significant left lower extremity arterial disease. The left toe-brachial index is normal. Although ankle brachial indices are within normal limits (0.95-1.29), arterial Doppler waveforms at the ankle suggest some component of arterial occlusive disease. 12/15; the patient's area on the left mid tibia looks better. Right posterior calf also better. He has the area on the left foot as well. All of his toes look better I think this was tinea. We are using Hydrofera Blue everywhere else The patient was in urgent care yesterday with wheezing and shortness of breath. He  got azithromycin and prednisone. He feels better. His lungs are currently clear to auscultation. He was not tested for Covid 19 12/29; the patient arrives in clinic today with quite a bit change. 2 weeks ago he only had areas on the left mid tibia right posterior calf with tinea pedis resolving between his toes. He arrives in clinic today with several areas on the dorsal toes on the right, dorsal left second toe. He has skin breakdown in the left medial calf probably from excess edema. Small area proximally in the medial calf. He has weeping edema fluid coming out of the skin excoriations on the left medial calf. He tells me that he is having a cardiac catheterization next week. I had a quick look at [facility] health link. He was found to have an ejection fraction of 25% during the work-up for persistent atrial fibrillation. He saw his cardiology office yesterday seen by the nurse practitioner. She increased his carvedilol. He has not been on diuretics for apparently several months and indeed in the nurse practitioner Dietrich Pates notes she had knowledge of this. 10/04/2019 on evaluation today patient presents as a walk-in visit concerning the fact that he did not have an appointment here for our clinic at this point. He actually had a cardiac catheterization earlier today and then came from there to here in order to be evaluated. With that being said unfortunately he is having significant cellulitis of his left lower extremity upon evaluation today this appears to have deteriorated even since last week's evaluation with Dr. Dellia Nims. The right lower extremity is actually doing okay I really see no evidence of deterioration at this point at those locations. In fact the right leg seems in general be doing quite well. Nonetheless I am concerned about infection and cellulitis of the left lower extremity and again considering his weakened heart I do not want him to develop into sepsis at all. He is also having  some trouble breathing today and I understand according to nursing staff this is always the case to some degree. With that being said the patient unfortunately seems to be in my opinion a little bit worse even his wife feels like that may be the case today. Unsure exactly what is leading to this. Cardiac catheterization I did review the report which showed an ejection fraction of 25% he also had an LAD blockage of around 25% based on what I saw. With that being said there was no significant blockages that required stenting at this point he does have  weakened cardiac muscles compared to normal. 10/05/2019; patient was seen yesterday in clinic. He was sent to the ER because of cellulitis of the left leg possibility. In the ER he was given 1 dose of IV Levaquin and discharged on Keflex. He came in the clinic initially for a nurse visit to rewrap his left leg. We did not look at the right leg today that is an Haematologist. The patient also had a cardiac cath. According to him there were no blockages but a very low ejection fraction. 1/12; back for an early follow-up. The condition of the left lower leg is a lot better although there are multiple open areas. All of them with not a particularly viable surface. On the right posterior calf he has a single wound with a clean surface. He has a wound with exposed bone on the PIP of the left second toe dorsally. He has wounds on the dorsal right first second and third toes. His arterial studies were normal. 1/19; the patient has 3 open wounds on the left leg anteriorly in the mid tibia, distally and medially just above the ankle and posteriorly. On the right he has a small area dime sized on the right posterior calf. His edema control is a lot better. He still has wounds on the right second and left second toes. The left second toe has exposed bone. X- ray I did last week did not show evidence of osteomyelitis in the left foot. 1/26; patient with a multiplicity of  wounds and problems. ooOn the left he has a circular area on the left anterior mid tibia ooLeft just above the medical ankle -left 2nd toe pip -right 2nd toe right posterior calf 2/2; patient with a multiplicity of wounds and problems. He has severe chronic venous insufficiency and the wounds on his leg are all on the left left anterior left medial at the medial malleolus and left posterior. We have been using Iodoflex to these areas to help with ongoing debridement. He also has largely traumatic wounds on his toes this includes the left second with exposed bone. Bone culture I did last week showed Staph aureus which is methicillin sensitive. I discussed with him today the idea of an amputation of this toe because it is literally nonfunctional however he wants to try antibiotics. Antibiotic choice is complicated by the fact that he is on Coumadin. He also has wounds over the dorsal part of the right first which is close to closed. Right second toe has exposed bone and the right third toe at the base of the right third toe is just about closed as well. We have been using silver alginate 2/9; patient with severe chronic venous insufficiency. He has large wounds on the left anterior mid tibia and on the left medial just above the ankle. I am concerned today about the depth of the area on the left mid tibia may be exposing into the muscle. He has small areas on the left posterior calf and on the right posterior calf although these do not look as threatening. He also has traumatic wounds on his toes. The right first and third are closed however the bilateral second toes have exposed bone. The area on the left is open into the distal interphalangeal joint. In my opinion these toes are nonsalvageable and will need amputation 2/16; patient with severe chronic venous insufficiency. He has wounds on the left anterior mid tibia left medial lower leg just above the ankle. He has small areas on  the left  posterior calf and a more prominent area on the right posterior calf. All of these are related to poorly controlled venous hypertension. He also had traumatic wounds on the PIPs of both second toes. Unfortunately both of these have exposed bone and in the case of the left this I think goes right into the joint itself. I do not think either 1 of these is salvageable. I have reviewed his arterial evaluation that was done in July last year. He also saw Dr. [name] in June. He felt these were venous stasis. Previously had segmental arterial pressures performed in 11/03/2014 which were normal. He was sent for for a follow-up arterial evaluation on July 1. On the right his ABIs were 1.03 with a TBI of 0.86 although his waveforms were monophasic on the left he had multiphasic waveforms at the ATA PTA but monophasic at the peroneal nevertheless his ABI was normal at 1.06 TBI also normal at 0.84. I think he has enough blood flow to heal an amputation which I think he needs of the left second toe and probably the right second toe as well 2/23; the patient is going for his bilateral second toe amputation by Dr. Amalia Hailey on Friday. In the meantime is 3 areas on the left leg and the small area on the right posterior look better. We have been using silver collagen. 3/2; the patient's toe amputations were canceled because of cardiac concerns. Apparently this surgery will need to be done in the hospital although they do not have an appointment. In the meantime he is continues to have 3 areas on the left leg one anteriorly and 2 smaller areas posteriorly on the left as well as the small posterior area on the right. We have been using silver collagen with compression Objective Constitutional Sitting or standing Blood Pressure is within target range for patient.. Pulse regular and within target range for patient.Marland Kitchen Respirations regular, non-labored and within target range.. Temperature is normal and within the target range  for the patient.Marland Kitchen Appears in no distress. Vitals Time Taken: 8:15 AM, Height: 74 in, Weight: 212 lbs, BMI: 27.2, Temperature: 97.6 F, Pulse: 68 bpm, Respiratory Rate: 20 breaths/min, Blood Pressure: 107/60 mmHg. Cardiovascular Pedal pulses are palpable. Edema control is good. General Notes: Wound exam; left anterior leg wound, the 2 areas on the left posterior and the area on the right posterior all look about the same varying degrees of surface debris although I did not do a mechanical debridement all the wounds were vigorously washed with Anasept and gauze they look like they are making progress ooThe left anterior second toe actually looks smaller and there is less exposed bone I did previously debrided this area. He has exposed bone protruding out of the PIP of the right second toe. Nothing appears to be ominous here Integumentary (Hair, Skin) Wound #13 status is Open. Original cause of wound was Gradually Appeared. The wound is located on the Left Toe Second. The wound measures 1.3cm length x 1.5cm width x 0.1cm depth; 1.532cm^2 area and 0.153cm^3 volume. There is bone, joint, and Fat Layer (Subcutaneous Tissue) Exposed exposed. There is no tunneling or undermining noted. There is a none present amount of drainage noted. The wound margin is distinct with the outline attached to the wound base. There is no granulation within the wound bed. There is a large (67-100%) amount of necrotic tissue within the wound bed including Eschar and Adherent Slough. Wound #19 status is Open. Original cause of wound was Gradually  Appeared. The wound is located on the Left,Anterior Lower Leg. The wound measures 1.4cm length x 1cm width x 0.2cm depth; 1.1cm^2 area and 0.22cm^3 volume. There is Fat Layer (Subcutaneous Tissue) Exposed exposed. There is no tunneling or undermining noted. There is a medium amount of serosanguineous drainage noted. The wound margin is flat and intact. There is small (1-33%) pink  granulation within the wound bed. There is a large (67-100%) amount of necrotic tissue within the wound bed including Adherent Slough. Wound #20 status is Healed - Epithelialized. Original cause of wound was Gradually Appeared. The wound is located on the Right Toe Great. The wound measures 0cm length x 0cm width x 0cm depth; 0cm^2 area and 0cm^3 volume. Wound #21 status is Open. Original cause of wound was Gradually Appeared. The wound is located on the Right Toe Second. The wound measures 1.7cm length x 1.5cm width x 0.2cm depth; 2.003cm^2 area and 0.401cm^3 volume. There is bone and Fat Layer (Subcutaneous Tissue) Exposed exposed. There is no tunneling or undermining noted. There is a small amount of serosanguineous drainage noted. The wound margin is flat and intact. There is no granulation within the wound bed. There is a large (67-100%) amount of necrotic tissue within the wound bed including Eschar and Adherent Slough. Wound #24 status is Open. Original cause of wound was Gradually Appeared. The wound is located on the [facility] Lower Leg. The wound measures 1.4cm length x 1.4cm width x 0.1cm depth; 1.539cm^2 area and 0.154cm^3 volume. There is Fat Layer (Subcutaneous Tissue) Exposed exposed. There is no tunneling or undermining noted. There is a medium amount of serosanguineous drainage noted. The wound margin is flat and intact. There is large (67-100%) red granulation within the wound bed. There is a small (1-33%) amount of necrotic tissue within the wound bed including Adherent Slough. Wound #25 status is Open. Original cause of wound was Gradually Appeared. The wound is located on the Left,Distal,Posterior Lower Leg. The wound measures 1.5cm length x 1.4cm width x 0.2cm depth; 1.649cm^2 area and 0.33cm^3 volume. There is Fat Layer (Subcutaneous Tissue) Exposed exposed. There is no tunneling or undermining noted. There is a small amount of serosanguineous drainage noted. The  wound margin is flat and intact. There is large (67-100%) red, pink granulation within the wound bed. There is no necrotic tissue within the wound bed. Wound #28 status is Open. Original cause of wound was Gradually Appeared. The wound is located on the Left,Proximal,Posterior Lower Leg. The wound measures 0.5cm length x 0.3cm width x 0.1cm depth; 0.118cm^2 area and 0.012cm^3 volume. There is Fat Layer (Subcutaneous Tissue) Exposed exposed. There is no tunneling or undermining noted. There is a small amount of serosanguineous drainage noted. The wound margin is flat and intact. There is large (67-100%) pink granulation within the wound bed. There is a small (1-33%) amount of necrotic tissue within the wound bed including Adherent Slough. Wound #3R status is Open. Original cause of wound was Gradually Appeared. The wound is located on the Right,Posterior Calf. The wound measures 1.7cm length x 2.2cm width x 0.1cm depth; 2.937cm^2 area and 0.294cm^3 volume. There is Fat Layer (Subcutaneous Tissue) Exposed exposed. There is no tunneling or undermining noted. There is a medium amount of serosanguineous drainage noted. The wound margin is distinct with the outline attached to the wound base. There is large (67-100%) red granulation within the wound bed. There is no necrotic tissue within the wound bed. 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 There was a Haematologist Compression Therapy Procedure by [name], RN. Post procedure Diagnosis Wound #19: Same as Pre-Procedure Wound #24 Pre-procedure diagnosis of Wound #24 is a Venous Leg Ulcer located on the Left,Distal,Anterior Lower Leg . There was a Haematologist Compression Therapy Procedure by [name], RN. Post procedure Diagnosis Wound #24: Same as Pre-Procedure Wound #25 Pre-procedure diagnosis of Wound #25 is a Venous Leg Ulcer located on the Left,Distal,Posterior Lower Leg . There was a Haematologist Compression Therapy Procedure by [name], RN. Post procedure Diagnosis Wound #25: Same as Pre-Procedure Wound #28 Pre-procedure diagnosis of Wound #28 is a Diabetic Wound/Ulcer of the Lower Extremity located on the Left,Proximal,Posterior Lower Leg . There was a Haematologist Compression Therapy Procedure by [name], RN. Post procedure Diagnosis Wound #28: Same as Pre-Procedure Wound #3R Pre-procedure diagnosis of Wound #3R is a Diabetic Wound/Ulcer of the Lower Extremity located on the Right,Posterior Calf . There was a Haematologist Compression Therapy Procedure by [name], RN. Post procedure Diagnosis Wound #3R: Same as Pre-Procedure Plan Follow-up Appointments: Return Appointment in 1 week. - Tuesday **********EXTRA TIME**************** Dressing Change Frequency: Wound #13 Left Toe Second: Change dressing every day. Wound #19 Left,Anterior Lower Leg: Do not change entire dressing for one week. Wound #20 Right Toe Great: Change dressing every day. Wound #21 Right Toe Second: Change dressing every day. Wound #24 Left,Distal,Anterior Lower Leg: Do not change entire dressing for one week. Wound #25 Left,Distal,Posterior Lower Leg: Do not change entire dressing for one week. Wound #3R  Right,Posterior Calf: Do not change entire dressing for one week. Skin Barriers/Peri-Wound Care: TCA Cream or Ointment Wound Cleansing: May shower with protection. Primary Wound Dressing: Wound #13 Left Toe Second: Calcium Alginate with Silver Wound #19 Left,Anterior Lower Leg: Silver Collagen Wound #20 Right Toe Great: Calcium Alginate with Silver Wound #21 Right Toe Second: Calcium Alginate with Silver Wound #24 Left,Distal,Anterior Lower Leg: Silver Collagen Wound #25 Left,Distal,Posterior Lower Leg: Silver Collagen Wound #3R Right,Posterior Calf: Silver Collagen Secondary Dressing: Dry Gauze - secure toes with tape Edema Control: Unna Boots Bilaterally Off-Loading: Open toe surgical shoe to: - left and right foot 1. We continue with silver alginate to both toes while they wait amputation for underlying osteomyelitis 2. Silver collagen with moistened with hydrogel under compression to the remaining wounds all of which appears somewhat better 3. We will see him back next week Electronic Signature(s) Signed: 11/28/2019 5:14:23 PM By: [name] MD Entered By: [name] on 11/28/2019 09:12:19 -------------------------------------------------------------------------------- SuperBill Details Patient Name: Date of Service: Daniel Gang. 11/28/2019 Medical Record [id number] Patient Account Number: [id number] Date of Birth/Sex: Treating RN: September 21, 1941 (78 y.o. [name]) [name] Primary Care Provider: Hurshel Party Other Clinician: Referring Provider: Treating Provider/Extender:Malyk Girouard, Cheryl Flash, [place] Weeks in Treatment: 39 Diagnosis Coding ICD-10 Codes Code Description L97.211 Non-pressure chronic ulcer of right calf limited to breakdown of skin L97.221  Non-pressure chronic ulcer of left calf limited to breakdown of skin I87.333 Chronic venous hypertension (idiopathic) with ulcer and inflammation of bilateral lower extremity E11.51 Type 2  diabetes mellitus with diabetic peripheral angiopathy without gangrene E11.42 Type 2 diabetes mellitus with diabetic polyneuropathy L97.524 Non-pressure chronic ulcer of other part of left foot with necrosis of bone L97.511 Non-pressure chronic ulcer of other part of right foot limited to breakdown of skin V67.01 Chronic systolic (congestive) heart failure I10.301 Other chronic osteomyelitis, left ankle and foot Facility Procedures The patient participates with Medicare or their insurance follows the Medicare Facility Guidelines: CPT4 Code Description Modifier Quantity [id number] - APPLY UNNA BOOT/PROFO BILATERAL 1 Physician Procedures CPT4 Code Description: [id number] - WC PHYS LEVEL 3 - EST PT ICD-10 Diagnosis Description L97.211 Non-pressure chronic ulcer of right calf limited to bre L97.221 Non-pressure chronic ulcer of left calf limited to brea L97.511 Non-pressure chronic  ulcer of other part of right foot L97.524 Non-pressure chronic ulcer of other part of left foot w Modifier: akdown of skin kdown of skin limited to breakd ith necrosis of b Quantity: 1 own of skin one Electronic Signature(s) Signed: 11/28/2019 5:14:23 PM By: [name] MD Entered By: [name] on 11/28/2019 09:12:42

## 2019-11-28 NOTE — Progress Notes (Addendum)
[name], [name] ([id number]) Visit Report for 11/28/2019 Arrival Information Details Patient Name: Date of Service: [name], [name] 11/28/2019 8:00 AM Medical Record [place] Patient Account Number: [id number] Date of Birth/Sex: Treating RN: September 30, 1940 (79 y.o. [name] Primary Care Fillmore Bynum: Hurshel Party Other Clinician: Referring Danasha Melman: Treating Tylasia Fletchall/Extender:Robson, Cheryl Flash, [place] Weeks in Treatment: 36 Visit Information History Since Last Visit Added or deleted any medications: No Patient Arrived: Wheel Chair Any new allergies or adverse reactions: No Arrival Time: 08:12 Had a fall or experienced change in No Accompanied By: wife activities of daily living that may affect Transfer Assistance: None risk of falls: Patient Identification Verified: Yes Signs or symptoms of abuse/neglect since last No Secondary Verification Process Yes visito Completed: Hospitalized since last visit: No Patient Requires Transmission- No Implantable device outside of the clinic excluding No Based Precautions: cellular tissue based products placed in the center Patient Has Alerts: Yes since last visit: Patient Alerts: Patient on Blood Has Dressing in Place as Prescribed: Yes Thinner Pain Present Now: Yes Notes per patient did not have any amputations to toes r/t scheduling surgery at outpatient surgery center. Per patient anesthesiologist felt patient needed hospital OR related to weak heart being under anesthesia. Electronic Signature(s) Signed: 11/28/2019 11:35:53 AM By: Deon Pilling Entered By: Deon Pilling on 11/28/2019 08:20:55 -------------------------------------------------------------------------------- Compression Therapy Details Patient Name: Date of Service: OLUSEGUN, GERSTENBERGER 11/28/2019 8:00 AM Medical Record [id number] Patient Account Number: [id number] Date of Birth/Sex: Treating RN: 06-10-1941 (78 y.o. [name]) [name] Primary Care  Lonzo Saulter: Hurshel Party Other Clinician: Referring Zaccai Chavarin: Treating Kyomi Hector/Extender:Robson, Cheryl Flash, [place] Weeks in Treatment: 39 Compression Therapy Performed for Wound Wound #19 Left,Anterior Lower Leg Assessment: Performed By: Clinician [name], RN Compression Type: Rolena Infante Post Procedure Diagnosis Same as Pre-procedure Electronic Signature(s) Signed: 11/28/2019 9:14:02 AM By: [name] RN Entered By: [name] on 11/28/2019 08:58:35 -------------------------------------------------------------------------------- Compression Therapy Details Patient Name: Date of Service: [name], [name] 11/28/2019 8:00 AM Medical Record [id number] Patient Account Number: [id number] Date of Birth/Sex: Treating RN: 03-16-1941 (78 y.o. [name]) [name] Primary Care Nimo Verastegui: Hurshel Party Other Clinician: Referring Nanci Lakatos: Treating Eladia Frame/Extender:Robson, Cheryl Flash, [place] Weeks in Treatment: 39 Compression Therapy Performed for Wound Wound #24 Left,Distal,Anterior Lower Leg Assessment: Performed By: Jake Church, RN Compression Type: Rolena Infante Post Procedure Diagnosis Same as Pre-procedure Electronic Signature(s) Signed: 11/28/2019 9:14:02 AM By: [name] RN Entered By: [name] on 11/28/2019 08:58:35 -------------------------------------------------------------------------------- Compression Therapy Details Patient Name: Date of Service: [name], [name] 11/28/2019 8:00 AM Medical Record [id number] Patient Account Number: [id number] Date of Birth/Sex: Treating RN: [birth year] (78 y.o. [name]) [name] Primary Care Jameson Morrow: Hurshel Party Other Clinician: Referring Latica Hohmann: Treating Suhaylah Wampole/Extender:Robson, Cheryl Flash, [place] Weeks in Treatment: 39 Compression Therapy Performed for Wound Wound #25 Left,Distal,Posterior Lower Leg Assessment: Performed By: Jake Church, RN Compression Type: Rolena Infante Post Procedure Diagnosis Same as Pre-procedure Electronic Signature(s) Signed: 11/28/2019 9:14:02 AM By: [name] RN Entered By: [name] on 11/28/2019 08:58:35 -------------------------------------------------------------------------------- Compression Therapy Details Patient Name: Date of Service: [name], [name] 11/28/2019 8:00 AM Medical Record [id number] Patient Account Number: [id number] Date of Birth/Sex: Treating RN: May 07, 1941 (78 y.o. [name]) [name] Primary Care Minh Jasper: Hurshel Party Other Clinician: Referring Lasya Vetter: Treating Mykiah Schmuck/Extender:Robson, Cheryl Flash, [place] Weeks in Treatment: 39 Compression Therapy Performed for Wound Wound #28 Left,Proximal,Posterior Lower Leg Assessment: Performed By: Jake Church, RN Compression Type: Rolena Infante Post Procedure Diagnosis Same as Pre-procedure Electronic Signature(s) Signed: 11/28/2019 9:14:02 AM By: [name] RN  Entered By: [name] on 11/28/2019 08:58:36 -------------------------------------------------------------------------------- Compression Therapy Details Patient Name: Date of Service: [name], [name] 11/28/2019 8:00 AM Medical Record [employer] Patient Account Number: [id number] Date of Birth/Sex: Treating RN: 09/12/1941 (78 y.o. [name]) [name] Primary Care Provider: Hurshel Party Other Clinician: Referring Provider: Treating Provider/Extender:Robson, Cheryl Flash, [facility] Weeks in Treatment: 39 Compression Therapy Performed for Wound Wound #3R Right,Posterior Calf Assessment: Performed By: Jake Church, RN Compression Type: Rolena Infante Post Procedure Diagnosis Same as Pre-procedure Electronic Signature(s) Signed: 11/28/2019 9:14:02 AM By: [name] RN Entered By: [name] on 11/28/2019 08:58:36 -------------------------------------------------------------------------------- Encounter Discharge Information Details Patient Name: Date  of Service: Daniel Pia D. 11/28/2019 8:00 AM Medical Record [id number] Patient Account Number: [id number] Date of Birth/Sex: Treating RN: 31-Mar-1941 (79 y.o. [name] Primary Care Provider: Hurshel Party Other Clinician: Referring Provider: Treating Provider/Extender:Robson, Cheryl Flash, [place] Weeks in Treatment: 43 Encounter Discharge Information Items Discharge Condition: Stable Ambulatory Status: Walker Discharge Destination: Home Transportation: Private Auto Accompanied By: wife Schedule Follow-up Appointment: Yes Clinical Summary of Care: Patient Declined Electronic Signature(s) Signed: 11/28/2019 5:07:00 PM By: Kela Millin Entered By: Kela Millin on 11/28/2019 09:25:40 -------------------------------------------------------------------------------- Lower Extremity Assessment Details Patient Name: Date of Service: [name], [name] 11/28/2019 8:00 AM Medical Record [id number] Patient Account Number: [id number] Date of Birth/Sex: Treating RN: 06-25-1941 (79 y.o. [name] Primary Care Provider: Hurshel Party Other Clinician: Referring Provider: Treating Provider/Extender:Robson, Cheryl Flash, [place] Weeks in Treatment: 39 Edema Assessment Assessed: [Left: Yes] [Right: Yes] Edema: [Left: Yes] [Right: Yes] Calf Left: Right: Point of Measurement: 31 cm From Medial Instep 31 cm 31 cm Ankle Left: Right: Point of Measurement: 11 cm From Medial Instep 21 cm 21 cm Electronic Signature(s) Signed: 11/28/2019 11:35:53 AM By: Deon Pilling Entered By: Deon Pilling on 11/28/2019 08:48:46 -------------------------------------------------------------------------------- Multi Wound Chart Details Patient Name: Date of Service: Daniel Pia D. 11/28/2019 8:00 AM Medical Record [id number] Patient Account Number: [id number] Date of Birth/Sex: Treating RN: [birth year] (78 y.o. 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] [20:Diabetic Wound/Ulcer of the Lower Extremity] Secondary Etiology: [13:N/A] [19:N/A] [20:N/A] Comorbid History: [13:Cataracts, Hypertension, Cataracts, Hypertension, N/A Peripheral Venous Disease, Peripheral Venous Disease, Type II Diabetes, Gout, Received Radiation] [19:Type II Diabetes, Gout, Received Radiation] Date Acquired: [13:08/22/2019] [19:09/23/2019] [20:09/23/2019] Weeks of Treatment: [13:14] [19:9] [20:9] Wound Status: [13:Open] [19:Open] [20:Healed - Epithelialized] Wound Recurrence: [13:No] [19:No] [20:No] Clustered Wound: [13:No] [19:No] [20:No] Clustered Quantity: [13:N/A] [19:1] [20:N/A] Measurements L x W x D 1.3x1.5x0.1 [19:1.4x1x0.2] [20:0x0x0] (cm) Area (cm) : [13:1.532] [19:1.1] [20:0] Volume (cm) : [13:0.153] [19:0.22] [20:0] % Reduction in Area: [13:50.20%] [19:97.90%] [20:100.00%] % Reduction in Volume: 50.30% [19:95.90%] [20:100.00%] Classification: [13:Grade 2] [19:Full Thickness Without Exposed Support Structures] [20:Grade 2] Exudate Amount: [13:None Present] [19:Medium] [20:N/A] Exudate Type: [13:N/A] [19:Serosanguineous] [20:N/A] Exudate Color: [13:N/A] [19:red, brown] [20:N/A] Wound Margin: [13:Distinct, outline attached Flat and Intact] [20:N/A] Granulation Amount: [13:None Present (0%)] [19:Small (1-33%)] [20:N/A] Granulation Quality: [13:N/A]  [19:Pink] [20:N/A] Necrotic Amount: [13:Large (67-100%)] [19:Large (67-100%)] [20:N/A] Necrotic Tissue: [13:Eschar, Adherent Slough] [19:Adherent Slough] [20:N/A] Exposed Structures: [13:Fat Layer (Subcutaneous Tissue) Exposed: Yes  Joint: Yes Bone: Yes Fascia: No Tendon: No Muscle: No] [19:Fat Layer (Subcutaneous Tissue) Exposed: Yes Fascia: No Tendon: No Muscle: No Joint: No Bone: No] [20:N/A] Epithelialization: [13:None] [19:Small (1-33%)] [20:N/A] Procedures Performed: [13:N/A] [19:Compression Therapy 21] [20:N/A 24] Photos: [13:No Photos] [19:No Photos] [20:No Photos] Wound Location: [13:Right Toe Second] [19:Left, Distal, Anterior Lower Left Lower Leg - Posterior, Leg] [20:Distal] Wounding Event: [13:Gradually Appeared] [19:Gradually Appeared] [20:Gradually Appeared] 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] [20:Venous Leg Ulcer] Secondary Etiology: [13:N/A] [19:Diabetic Wound/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] [20:Lower Extremity] Comorbid History: [13:Cataracts, Hypertension, Cataracts, Hypertension, Cataracts, Hypertension, Peripheral Venous Disease, Peripheral Venous Disease, Peripheral Venous Disease, Type II Diabetes, Gout, Received Radiation] [19:Type II Diabetes, Gout,  Received Radiation] [20:Type II Diabetes, Gout, Received Radiation] Date Acquired: [13:09/23/2019] [19:10/04/2019] [20:10/04/2019] Weeks of Treatment: [13:9] [19:7] [20:7] Wound Status: [13:Open] [19:Open] [20:Open] Wound Recurrence: [13:No] [19:No] [20:No] Clustered Wound: [13:No] [19:Yes] [20:Yes] Clustered Quantity: [13:N/A] [19:1] [20:1] Measurements L x W x D 1.7x1.5x0.2 [19:1.4x1.4x0.1] [20:1.5x1.4x0.2] (cm) Area (cm) : [13:2.003] [19:1.539] [20:1.649] Volume (cm) : [13:0.401] [19:0.154] [20:0.33] % Reduction in Area: [13:-203.50%] [19:75.90%] [20:93.30%] % Reduction in Volume: -507.60% [19:75.90%] [20:86.70%] Classification: [13:Grade 2]  [19:Full Thickness Without Exposed Support Structures Exposed Support Structures] [20:Full Thickness Without] Exudate Amount: [13:Small] [19:Medium] [20:Small] Exudate Type: [13:Serosanguineous] [19:Serosanguineous] [20:Serosanguineous] Exudate Color: [13:red, brown] [19:red, brown] [20:red, brown] Wound Margin: [13:Flat and Intact] [19:Flat and Intact] [20:Flat and Intact] Granulation Amount: [13:None Present (0%)] [19:Large (67-100%)] [20:Large (67-100%)] Granulation Quality: [13:N/A] [19:Red] [20:Red, Pink] Necrotic Amount: [13:Large (67-100%)] [19:Small (1-33%)] [20:None Present (0%)] Necrotic Tissue: [13:Eschar, Adherent Slough Adherent Slough] [20:N/A] Exposed Structures: [13:Fat Layer (Subcutaneous Fat Layer (Subcutaneous Fat Layer (Subcutaneous Tissue) Exposed: Yes Bone: Yes Fascia: No Tendon: No Muscle: No Joint: No] [19:Tissue) Exposed: Yes Fascia: No Tendon: No Muscle: No Joint: No Bone: No] [20:Tissue) Exposed: Yes  Fascia: No Tendon: No Muscle: No Joint: No Bone: No] Epithelialization: [13:None] [19:Large (67-100%)] [20:Small (1-33%)] Procedures Performed: N/A [13:28] [19:Compression Therapy] [20:Compression Therapy 3R] Photos: [13:No Photos] [19:No Photos] [20:N/A] Wound Location: [13:Left Lower Leg - Posterior, Right Calf - Posterior Proximal] [20:N/A] Wounding Event: [13:Gradually Appeared] [19:Gradually Appeared] [20:N/A] Primary Etiology: [13:Diabetic Wound/Ulcer of the Diabetic Wound/Ulcer of the N/A Lower Extremity] [19:Lower Extremity] Secondary Etiology: [13:Venous Leg Ulcer] [19:N/A] [20:N/A] Comorbid History: [13:Cataracts, Hypertension, Cataracts, Hypertension, N/A Peripheral Venous Disease, Peripheral Venous Disease, Type II Diabetes, Gout, Received Radiation] [19:Type II Diabetes, Gout, Received Radiation] Date Acquired: [13:10/31/2019] [19:02/28/2019] [20:N/A] Weeks of Treatment: [13:4] [19:39] [20:N/A] Wound Status: [13:Open] [19:Open] [20:N/A] Wound Recurrence:  [13:No] [19:Yes] [20:N/A] Clustered Wound: [13:No] [19:No] [20:N/A] Clustered Quantity: [13:N/A] [19:N/A] [20:N/A] Measurements L x W x D 0.5x0.3x0.1 [19:1.7x2.2x0.1] [20:N/A] (cm) Area (cm) : [13:0.118] [19:2.937] [20:N/A] Volume (cm) : [13:0.012] [19:0.294] [20:N/A] % Reduction in Area: [13:80.00%] [19:88.00%] [20:N/A] % Reduction in Volume: 79.70% [19:88.00%] [20:N/A] Classification: [13:Grade 1] [19:Grade 2] [20:N/A] Exudate Amount: [13:Small] [19:Medium] [20:N/A] Exudate Type: [13:Serosanguineous] [19:Serosanguineous] [20:N/A] Exudate Color: [13:red, brown] [19:red, brown] [20:N/A] Wound Margin: [13:Flat and Intact] [19:Distinct, outline attached N/A] Granulation Amount: [13:Large (67-100%)] [19:Large (67-100%)] [20:N/A] Granulation Quality: [13:Pink] [19:Red] [20:N/A] Necrotic Amount: [13:Small (1-33%)] [19:None Present (0%)] [20:N/A] Necrotic Tissue: [13:Adherent Slough] [19:N/A] [20:N/A] Exposed Structures: [13:Fat Layer (Subcutaneous Fat Layer (Subcutaneous N/A Tissue) Exposed: Yes Fascia: No Tendon: No Muscle: No Joint: No Bone: No] [19:Tissue) Exposed: Yes Fascia: No Tendon: No Muscle: No Joint: No Bone: No] Epithelialization: [13:Large (67-100%)] [19:Large (67-100%) Compression Therapy] [20:N/A N/A] Treatment Notes Electronic Signature(s) Signed: 11/28/2019 9:14:02 AM By: [name]  RN Signed: 11/28/2019 5:14:23 PM By: [name] MD Entered By: [name] on 11/28/2019 09:08:48 -------------------------------------------------------------------------------- Multi-Disciplinary Care Plan Details Patient Name: Date of Service: Daniel Pia D. 11/28/2019 8:00 AM Medical Record [id number] Patient Account Number: [id number] Date of Birth/Sex: Treating RN: [birth year] (78 y.o. [name]) [name] Primary Care Provider: Hurshel Party Other Clinician: Referring Provider: Treating Provider/Extender:Robson, Cheryl Flash, [place] Weeks in Treatment: 44 Active  Inactive Wound/Skin Impairment Nursing Diagnoses: Knowledge deficit related to ulceration/compromised skin integrity Goals: Patient/caregiver will verbalize understanding of skin care regimen Date Initiated: 02/28/2019 Target Resolution Date: 12/01/2019 Goal Status: Active Ulcer/skin breakdown will have a volume reduction of 30% by week 4 Date Initiated: 02/28/2019 Date Inactivated: 04/04/2019 Target Resolution Date: 03/31/2019 Goal Status: Met Ulcer/skin breakdown will have a volume reduction of 50% by week 8 Date Initiated: 04/04/2019 Date Inactivated: 05/09/2019 Target Resolution Date: 05/05/2019 Goal Status: Met Ulcer/skin breakdown will have a volume reduction of 80% by week 12 Date Initiated: 05/09/2019 Date Inactivated: 06/13/2019 Target Resolution Date: 06/09/2019 Unmet Goal Status: Unmet Reason: comorbities/new wounds Ulcer/skin breakdown will heal within 14 weeks Date Initiated: 06/13/2019 Date Inactivated: 07/11/2019 Target Resolution Date: 07/07/2019 Unmet Goal Status: Unmet Reason: comorbityies Interventions: Assess patient/caregiver ability to obtain necessary supplies Assess patient/caregiver ability to perform ulcer/skin care regimen upon admission and as needed Assess ulceration(s) every visit Notes: Electronic Signature(s) Signed: 11/28/2019 9:14:02 AM By: [name] RN Entered By: [name] on 11/28/2019 08:02:26 -------------------------------------------------------------------------------- Pain Assessment Details Patient Name: Date of Service: [name], SAPIA. 11/28/2019 8:00 AM Medical Record [id number] Patient Account Number: [id number] Date of Birth/Sex: Treating RN: 17-Feb-1941 (79 y.o. [name] Primary Care Provider: Hurshel Party Other Clinician: Referring Provider: Treating Provider/Extender:Robson, Cheryl Flash, [place] Weeks in Treatment: 36 Active Problems Location of Pain Severity and Description of Pain Patient Has Paino Yes Site  Locations Pain Location: Generalized Pain, Pain in Ulcers Rate the pain. Current Pain Level: 3 Worst Pain Level: 10 Least Pain Level: 0 Tolerable Pain Level: 8 Character of Pain Describe the Pain: Dull, Heavy Pain Management and Medication Current Pain Management: Medication: Yes Cold Application: No Rest: Yes Massage: No Activity: No T.E.N.S.: No Heat Application: No Leg drop or elevation: No Is the Current Pain Management Adequate: Inadequate How does your wound impact your activities of daily livingo Sleep: No Bathing: No Appetite: No Relationship With Others: No Bladder Continence: No Emotions: No Bowel Continence: No Work: No Toileting: No Drive: No Dressing: No Hobbies: No Electronic Signature(s) Signed: 11/28/2019 11:35:53 AM By: Deon Pilling Entered By: Deon Pilling on 11/28/2019 08:22:19 -------------------------------------------------------------------------------- Patient/Caregiver Education Details Patient Name: Date of Service: [name], Daniel D. 3/2/2021andnbsp8:00 AM Medical Record [phone] Patient Account Number: [id number] Date of Birth/Gender: Treating RN: Dec 11, 1940 (78 y.o. [name]) [name] Primary Care Physician: Hurshel Party Other Clinician: Referring Physician: Treating Physician/Extender:Robson, Cheryl Flash, [place] Weeks in Treatment: 60 Education Assessment Education Provided To: Patient Education Topics Provided Wound/Skin Impairment: Methods: Explain/Verbal Responses: State content correctly Electronic Signature(s) Signed: 11/28/2019 9:14:02 AM By: [name] RN Entered By: [name] on 11/28/2019 08:02:45 -------------------------------------------------------------------------------- Wound Assessment Details Patient Name: Date of Service: [name], [name] 11/28/2019 8:00 AM Medical Record [id number] Patient Account Number: [id number] Date of Birth/Sex: Treating RN: 02/22/1941 (78 y.o. [name]) [name] Primary Care Provider: Hurshel Party Other Clinician: Referring Provider: Treating Provider/Extender:Robson, Cheryl Flash, [place] Weeks in Treatment: 39 Wound Status Wound Number: 13 Primary Diabetic Wound/Ulcer of the Lower Extremity Etiology: Wound Location: Left Toe Second Wound Open Wounding Event: Gradually Appeared Status: Date Acquired:  08/22/2019 Comorbid Cataracts, Hypertension, Peripheral Venous Weeks Of Treatment: 14 History: Disease, Type II Diabetes, Gout, Received Clustered Wound: No Radiation Photos Wound Measurements Length: (cm) 1.3 % Reducti Width: (cm) 1.5 % Reducti Depth: (cm) 0.1 Epithelia Area: (cm) 1.532 Tunnelin Volume: (cm) 0.153 Undermin Wound Description Classification: Grade 2 Wound Margin: Distinct, outline attached Exudate Amount: None Present Wound Bed Granulation Amount: None Present (0%) Necrotic Amount: Large (67-100%) Necrotic Quality: Eschar, Adherent Slough Foul Odor After Cleansing: No Slough/Fibrino Yes Exposed Structure Fascia Exposed: No Fat Layer (Subcutaneous Tissue) Exposed: Yes Tendon Exposed: No Muscle Exposed: No Joint Exposed: Yes Bone Exposed: Yes on in Area: 50.2% on in Volume: 50.3% lization: None g: No ing: No Treatment Notes Wound #13 (Left Toe Second) 1. Cleanse With Wound Cleanser 3. Primary Dressing Applied Calcium Alginate Ag 4. Secondary Dressing Dry Gauze Notes netting Electronic Signature(s) Signed: 11/30/2019 3:59:23 PM By: Mikeal Hawthorne EMT/HBOT Signed: 11/30/2019 5:55:28 PM By: [name] RN Previous Signature: 11/28/2019 11:35:53 AM Version By: Deon Pilling Entered By: Mikeal Hawthorne on 11/30/2019 11:05:07 -------------------------------------------------------------------------------- Wound Assessment Details Patient Name: Date of Service: Daniel Pia D. 11/28/2019 8:00 AM Medical Record [id number] Patient Account Number: [id number] Date of Birth/Sex: Treating  RN: 05-26-1941 (78 y.o. [name]) [name] Primary Care Provider: Hurshel Party Other Clinician: Referring Provider: Treating Provider/Extender:Robson, Cheryl Flash, [place] Weeks in Treatment: 39 Wound Status Wound Number: 19 Primary Venous Leg Ulcer Etiology: Wound Location: Left Lower Leg - Anterior Wound Open Wounding Event: Gradually Appeared Status: Date Acquired: 09/23/2019 Comorbid Cataracts, Hypertension, Peripheral Venous Weeks Of Treatment: 9 History: Disease, Type II Diabetes, Gout, Received Clustered Wound: No Radiation Photos Wound Measurements Length: (cm) 1.4 % Reduct Width: (cm) 1 % Reduct Depth: (cm) 0.2 Epitheli Clustered Quantity: 1 Tunnelin Area: (cm) 1.1 Undermi Volume: (cm) 0.22 Wound Description Classification: Full Thickness Without Exposed Support Foul Odo Structures Slough/F Wound Flat and Intact Margin: Exudate Medium Amount: Exudate Serosanguineous Type: Exudate red, brown Color: Wound Bed Granulation Amount: Small (1-33%) Granulation Quality: Pink Fascia E Necrotic Amount: Large (67-100%) Fat Laye Necrotic Quality: Adherent Slough Tendon E Muscle E Joint Ex Bone Exp r After Cleansing: No ibrino Yes Exposed Structure xposed: No r (Subcutaneous Tissue) Exposed: Yes xposed: No xposed: No posed: No osed: No ion in Area: 97.9% ion in Volume: 95.9% alization: Small (1-33%) g: No ning: No Treatment Notes Wound #19 (Left, Anterior Lower Leg) 1. Cleanse With Wound Cleanser Soap and water 3. Primary Dressing Applied Collegen AG 4. Secondary Dressing Dry Gauze 6. Support Layer Best boy) Signed: 11/30/2019 3:59:23 PM By: Mikeal Hawthorne EMT/HBOT Signed: 11/30/2019 5:55:28 PM By: [name] RN Previous Signature: 11/28/2019 11:35:53 AM Version By: Deon Pilling Entered By: Mikeal Hawthorne on 11/30/2019  11:05:57 -------------------------------------------------------------------------------- Wound Assessment Details Patient Name: Date of Service: Daniel Pia D. 11/28/2019 8:00 AM Medical Record [id number] Patient Account Number: [id number] Date of Birth/Sex: Treating RN: 05-Feb-1941 (79 y.o. [name] Primary Care Provider: Hurshel Party Other Clinician: Referring Provider: Treating Provider/Extender:Robson, Cheryl Flash, [place] Weeks in Treatment: 39 Wound Status Wound Number: 20 Primary Diabetic Wound/Ulcer of the Lower Extremity Etiology: Wound Location: Right Toe Great Wound Healed - Epithelialized Wounding Event: Gradually Appeared Status: Date Acquired: 09/23/2019 Comorbid Cataracts, Hypertension, Peripheral Venous Weeks Of Treatment: 9 History: Disease, Type II Diabetes, Gout, Received Clustered Wound: No Radiation Photos Wound Measurements Length: (cm) 0 % Reducti Width: (cm) 0 % Reducti Depth: (cm) 0 Epithelia Area: (cm) 0 Volume: (cm) 0 Wound Description Classification: Grade 2 Wound Margin:  Distinct, outline attached Exudate Amount: Small Exudate Type: Serosanguineous Exudate Color: red, brown Wound Bed Granulation Amount: Small (1-33%) Granulation Quality: Pink Necrotic Amount: Large (67-100%) Necrotic Quality: Adherent Slough Foul Odor After Cleansing: No Slough/Fibrino No Exposed Structure Fascia Exposed: No Fat Layer (Subcutaneous Tissue) Exposed: Yes Tendon Exposed: No Muscle Exposed: No Joint Exposed: No Bone Exposed: No on in Area: 100% on in Volume: 100% lization: Large (67-100%) Electronic Signature(s) Signed: 11/30/2019 3:59:23 PM By: Mikeal Hawthorne EMT/HBOT Signed: 11/30/2019 6:05:23 PM By: Deon Pilling Previous Signature: 11/28/2019 11:35:53 AM Version By: Deon Pilling Entered By: Mikeal Hawthorne on 11/30/2019 11:01:13 -------------------------------------------------------------------------------- Wound Assessment  Details Patient Name: Date of Service: Daniel Pia D. 11/28/2019 8:00 AM Medical Record [id number] Patient Account Number: [id number] Date of Birth/Sex: Treating RN: 1941/03/12 (78 y.o. [name]) [name] Primary Care Trishna Cwik: Hurshel Party Other Clinician: Referring Desman Polak: Treating Vondra Aldredge/Extender:Robson, Cheryl Flash, [place] Weeks in Treatment: 39 Wound Status Wound Number: 21 Primary Diabetic Wound/Ulcer of the Lower Extremity Etiology: Wound Location: Right Toe Second Wound Open Wounding Event: Gradually Appeared Status: Date Acquired: 09/23/2019 Comorbid Cataracts, Hypertension, Peripheral Venous Weeks Of Treatment: 9 History: Disease, Type II Diabetes, Gout, Received Clustered Wound: No Radiation Photos Wound Measurements Length: (cm) 1.7 % Reducti Width: (cm) 1.5 % Reducti Depth: (cm) 0.2 Epithelia Area: (cm) 2.003 Tunnelin Volume: (cm) 0.401 Undermin Wound Description Classification: Grade 2 Wound Margin: Flat and Intact Exudate Amount: Small Exudate Type: Serosanguineous Exudate Color: red, brown Wound Bed Granulation Amount: None Present (0%) Necrotic Amount: Large (67-100%) Necrotic Quality: Eschar, Adherent Slough Foul Odor After Cleansing: No Slough/Fibrino Yes Exposed Structure Fascia Exposed: No Fat Layer (Subcutaneous Tissue) Exposed: Yes Tendon Exposed: No Muscle Exposed: No Joint Exposed: No Bone Exposed: Yes on in Area: -203.5% on in Volume: -507.6% lization: None g: No ing: No Treatment Notes Wound #21 (Right Toe Second) 1. Cleanse With Wound Cleanser 3. Primary Dressing Applied Calcium Alginate Ag 4. Secondary Dressing Dry Gauze Notes netting Electronic Signature(s) Signed: 11/30/2019 3:59:23 PM By: Mikeal Hawthorne EMT/HBOT Signed: 11/30/2019 5:55:28 PM By: [name] RN Previous Signature: 11/28/2019 11:35:53 AM Version By: Deon Pilling Entered By: Mikeal Hawthorne on 11/30/2019  11:01:41 -------------------------------------------------------------------------------- Wound Assessment Details Patient Name: Date of Service: Daniel Pia D. 11/28/2019 8:00 AM Medical Record [id number] Patient Account Number: [id number] Date of Birth/Sex: Treating RN: Jul 18, 1941 (79 y.o. [name] Primary Care Blanton Kardell: Hurshel Party Other Clinician: Referring Stuti Sandin: Treating Jesika Men/Extender:Robson, Cheryl Flash, [place] Weeks in Treatment: 39 Wound Status Wound Number: 24 Primary Venous Leg Ulcer Etiology: Wound Location: Left Lower Leg - Anterior, Distal Secondary Diabetic Wound/Ulcer of the Lower Extremity Wounding Event: Gradually Appeared Etiology: Date Acquired: 10/04/2019 Wound Open Weeks Of Treatment: 7 Status: Clustered Wound: Yes Comorbid Cataracts, Hypertension, Peripheral Venous History: Disease, Type II Diabetes, Gout, Received Radiation Photos Wound Measurements Length: (cm) 1.4 % Reduct Width: (cm) 1.4 % Reduct Depth: (cm) 0.1 Epitheli Clustered Quantity: 1 Tunnelin Area: (cm) 1.539 Undermi Volume: (cm) 0.154 Wound Description Classification: Full Thickness Without Exposed Support Foul Odo Structures Slough/F Wound Flat and Intact Margin: Exudate Medium Amount: Exudate Serosanguineous Type: Exudate red, brown Color: Wound Bed Granulation Amount: Large (67-100%) Granulation Quality: Red Fascia E Necrotic Amount: Small (1-33%) Fat Laye Necrotic Quality: Adherent Slough Tendon E Muscle E Joint Ex Bone Exp r After Cleansing: No ibrino Yes Exposed Structure xposed: No r (Subcutaneous Tissue) Exposed: Yes xposed: No xposed: No posed: No osed: No ion in Area: 75.9% ion in Volume: 75.9% alization: Large (67-100%) g: No ning: No Treatment  Notes Wound #24 (Left, Distal, Anterior Lower Leg) 1. Cleanse With Wound Cleanser Soap and water 3. Primary Dressing Applied Collegen AG 4. Secondary Dressing Dry  Gauze 6. Support Layer Best boy) Signed: 11/30/2019 3:59:23 PM By: Mikeal Hawthorne EMT/HBOT Signed: 11/30/2019 6:05:23 PM By: Deon Pilling Previous Signature: 11/28/2019 11:35:53 AM Version By: Deon Pilling Entered By: Mikeal Hawthorne on 11/30/2019 11:06:24 -------------------------------------------------------------------------------- Wound Assessment Details Patient Name: Date of Service: Daniel Pia D. 11/28/2019 8:00 AM Medical Record [id number] Patient Account Number: [id number] Date of Birth/Sex: Treating RN: 07-06-1941 (79 y.o. [name] Primary Care Provider: Hurshel Party Other Clinician: Referring Provider: Treating Provider/Extender:Robson, Cheryl Flash, [place] Weeks in Treatment: 39 Wound Status Wound Number: 25 Primary Venous Leg Ulcer Etiology: Wound Location: Left Lower Leg - Posterior, Distal Secondary Diabetic Wound/Ulcer of the Lower Extremity Wounding Event: Gradually Appeared Etiology: Date Acquired: 10/04/2019 Wound Open Weeks Of Treatment: 7 Status: Clustered Wound: Yes Comorbid Cataracts, Hypertension, Peripheral Venous History: Disease, Type II Diabetes, Gout, Received Radiation Wound Measurements Length: (cm) 1.5 % Reduct Width: (cm) 1.4 % Reduct Depth: (cm) 0.2 Epitheli Clustered Quantity: 1 Tunnelin Area: (cm) 1.649 Undermi Volume: (cm) 0.33 Wound Description Full Thickness Without Exposed Support Foul Odo Classification: Structures Slough/F Wound Flat and Intact Margin: Exudate Small Amount: Exudate Serosanguineous Type: Exudate red, brown Color: Wound Bed Granulation Amount: Large (67-100%) Granulation Quality: Red, Pink Fascia E Necrotic Amount: None Present (0%) Fat Laye Tendon E Muscle E Joint Ex Bone Exp r After Cleansing: No ibrino No Exposed Structure xposed: No r (Subcutaneous Tissue) Exposed: Yes xposed: No xposed: No posed: No osed: No ion in Area: 93.3% ion in  Volume: 86.7% alization: Small (1-33%) g: No ning: No Treatment Notes Wound #25 (Left, Distal, Posterior Lower Leg) 1. Cleanse With Wound Cleanser Soap and water 3. Primary Dressing Applied Collegen AG 4. Secondary Dressing Dry Gauze 6. Support Layer Best boy) Signed: 11/28/2019 11:35:53 AM By: Deon Pilling Entered By: Deon Pilling on 11/28/2019 08:44:35 -------------------------------------------------------------------------------- Wound Assessment Details Patient Name: Date of Service: [name], [name] 11/28/2019 8:00 AM Medical Record [id number] Patient Account Number: [id number] Date of Birth/Sex: Treating RN: 11-16-1940 (79 y.o. [name] Primary Care Provider: Hurshel Party Other Clinician: Referring Provider: Treating Provider/Extender:Robson, Cheryl Flash, [place] Weeks in Treatment: 39 Wound Status Wound Number: 28 Primary Diabetic Wound/Ulcer of the Lower Extremity Etiology: Wound Location: Left Lower Leg - Posterior, Proximal Secondary Venous Leg Ulcer Etiology: Wounding Event: Gradually Appeared Wound Open Date Acquired: 10/31/2019 Status: Weeks Of Treatment: 4 Comorbid Cataracts, Hypertension, Peripheral Venous Clustered Wound: No History: Disease, Type II Diabetes, Gout, Received Radiation Wound Measurements Length: (cm) 0.5 Width: (cm) 0.3 Depth: (cm) 0.1 Area: (cm) 0.118 Volume: (cm) 0.012 Wound Description Classification: Grade 1 Wound Margin: Flat and Intact Exudate Amount: Small Exudate Type: Serosanguineous Exudate Color: red, brown Wound Bed Granulation Amount: Large (67-100%) Granulation Quality: Pink Necrotic Amount: Small (1-33%) Necrotic Quality: Adherent Slough r Cleansing: No Yes Exposed Structure : No cutaneous Tissue) Exposed: Yes : No : No No No % Reduction in Area: 80% % Reduction in Volume: 79.7% Epithelialization: Large (67-100%) Tunneling: No Undermining:  No Foul Odor Afte Slough/Fibrino Fascia Exposed Fat Layer (Sub Tendon Exposed Muscle Exposed Joint Exposed: Bone Exposed: Treatment Notes Wound #28 (Left, Proximal, Posterior Lower Leg) 1. Cleanse With Wound Cleanser Soap and water 3. Primary Dressing Applied Collegen AG 4. Secondary Dressing Dry Gauze 6. Support Layer Best boy) Signed: 11/28/2019 11:35:53 AM By: Rolin Barry,  Bobbi Entered By: Deon Pilling on 11/28/2019 08:40:42 -------------------------------------------------------------------------------- Wound Assessment Details Patient Name: Date of Service: [name], [name] 11/28/2019 8:00 AM Medical Record [place] Patient Account Number: [id number] Date of Birth/Sex: Treating RN: 1940/11/11 (78 y.o. [name]) [name] Primary Care Idalys Konecny: Hurshel Party Other Clinician: Referring Aarilyn Dye: Treating Fahed Morten/Extender:Robson, Cheryl Flash, [place] Weeks in Treatment: 39 Wound Status Wound Number: [id number] Primary Diabetic Wound/Ulcer of the Lower Extremity Etiology: Wound Location: Right Calf - Posterior Wound Open Wounding Event: Gradually Appeared Status: Date Acquired: 02/28/2019 Comorbid Cataracts, Hypertension, Peripheral Venous Weeks Of Treatment: 39 History: Disease, Type II Diabetes, Gout, Received Clustered Wound: No Radiation Photos Wound Measurements Length: (cm) 1.7 % Reductio Width: (cm) 2.2 % Reductio Depth: (cm) 0.1 Epithelial Area: (cm) 2.937 Tunneling Volume: (cm) 0.294 Undermini Wound Description Classification: Grade 2 Wound Margin: Distinct, outline attached Exudate Amount: Medium Exudate Type: Serosanguineous Exudate Color: red, brown Wound Bed Granulation Amount: Large (67-100%) Granulation Quality: Red Necrotic Amount: None Present (0%) Foul Odor After Cleansing: No Slough/Fibrino No Exposed Structure Fascia Exposed: No Fat Layer (Subcutaneous Tissue) Exposed: Yes Tendon Exposed: No Muscle  Exposed: No Joint Exposed: No Bone Exposed: No n in Area: 88% n in Volume: 88% ization: Large (67-100%) : No ng: No Treatment Notes Wound #3R (Right, Posterior Calf) 1. Cleanse With Wound Cleanser Soap and water 3. Primary Dressing Applied Collegen AG 4. Secondary Dressing Dry Gauze 6. Support Layer Best boy) Signed: 11/30/2019 3:59:23 PM By: Mikeal Hawthorne EMT/HBOT Signed: 11/30/2019 5:55:28 PM By: [name] RN Previous Signature: 11/28/2019 11:35:53 AM Version By: Deon Pilling Entered By: Mikeal Hawthorne on 11/30/2019 11:02:10 -------------------------------------------------------------------------------- Vitals Details Patient Name: Date of Service: Daniel Pia D. 11/28/2019 8:00 AM Medical Record [id number] Patient Account Number: [id number] Date of Birth/Sex: Treating RN: [birth year] (79 y.o. [name] Primary Care Crystalina Stodghill: Hurshel Party Other Clinician: Referring Gypsy Kellogg: Treating Keila Turan/Extender:Robson, Cheryl Flash, [place] Weeks in Treatment: 28 Vital Signs Time Taken: 08:15 Temperature (F): 97.6 Height (in): 74 Pulse (bpm): 68 Weight (lbs): 212 Respiratory Rate (breaths/min): 20 Body Mass Index (BMI): 27.2 Blood Pressure (mmHg): 107/60 Reference Range: 80 - 120 mg / dl Electronic Signature(s) Signed: 11/28/2019 11:35:53 AM By: Deon Pilling Entered By: Deon Pilling on 11/28/2019 08:21:55

## 2019-11-28 NOTE — Progress Notes (Signed)
   HPI: 79 y.o. male presenting today as a new patient referral from [facility] wound care center regarding ulcers to the bilateral second toes.  Patient has PMHx of CHF, DM type II, and atrial fibrillation.  Patient has had ulcers to the bilateral great toes for several months now coming up on 1 year.  Patient was diagnosed with osteomyelitis of the second toes bilaterally at the wound care center based on radiographic exam.  His wife has been cleaning and redressing the wounds daily.  Conservative wound care modalities have been unsuccessful in providing any sort of improvement of her alleviation of symptoms for the patient.  He presents today for possible surgical consultation and to discuss amputation of the second digits bilaterally.  Past Medical History:  Diagnosis Date  . Cancer ([place])   . CHF (congestive heart failure) ([place])   . Chronic atrial fibrillation ([place])   . Chronic bronchitis   . Diabetes mellitus, type II ([place])    no insulin  . Gout   . History of herpes zoster virus   . History of radiation therapy 12/21/12- 02/15/13   prostate 78 gray in 40 fx  . Hyperlipidemia   . Hypertension   . Prostate cancer ([place]) 2014   EBRT + hormonal therapy  . Vitamin D deficiency disease 06/07/2019     Physical Exam: General: The patient is alert and oriented x3 in no acute distress.  Dermatology: Skin is warm, dry and supple bilateral lower extremities. Negative for open lesions or macerations.  Vascular: Palpable pedal pulses bilaterally. No edema or erythema noted. Capillary refill within normal limits.  Neurological: Epicritic and protective threshold grossly intact bilaterally.   Musculoskeletal Exam: Range of motion within normal limits to all pedal and ankle joints bilateral. Muscle strength 5/5 in all groups bilateral.   Radiographic Exam:  Osteolytic destruction of the second toe consistent with osteomyelitis noted at the level of the PIPJ left second toe.  Assessment: 1.   Ulcers bilateral second toes 2.  Osteomyelitis left second toe PIPJ based on radiographic exam 3.  Hammertoe contracture second digit bilateral   Plan of Care:  1. Patient evaluated. X-Rays reviewed.  2.  After evaluation I do believe the patient would benefit from bilateral second toe amputations.  The patient is in poor health and would need to have medical clearance by cardiology and PCP prior to surgical intervention. 3.  All possible complications and details the procedure were explained.  No guarantees were expressed or implied.  All patient questions were answered. 4.  Authorization for surgery initiated today.  Surgery will consist of bilateral second toe amputations. 5.  Continue management [place] wound care center 6.  Postoperative shoes dispensed bilateral 7.  Return to clinic 1 week postop      Edrick Kins, DPM [facility]  Dr. Edrick Kins, DPM    [address], [place] [address]                Office [phone]  Fax [phone]

## 2019-12-04 ENCOUNTER — Encounter: Payer: Medicare HMO | Admitting: Podiatry

## 2019-12-05 ENCOUNTER — Encounter (HOSPITAL_BASED_OUTPATIENT_CLINIC_OR_DEPARTMENT_OTHER): Payer: Medicare HMO | Admitting: Internal Medicine

## 2019-12-05 ENCOUNTER — Other Ambulatory Visit: Payer: Self-pay

## 2019-12-05 DIAGNOSIS — L97222 Non-pressure chronic ulcer of left calf with fat layer exposed: Secondary | ICD-10-CM | POA: Diagnosis not present

## 2019-12-05 DIAGNOSIS — I5022 Chronic systolic (congestive) heart failure: Secondary | ICD-10-CM | POA: Diagnosis not present

## 2019-12-05 DIAGNOSIS — L97822 Non-pressure chronic ulcer of other part of left lower leg with fat layer exposed: Secondary | ICD-10-CM | POA: Diagnosis not present

## 2019-12-05 DIAGNOSIS — M86672 Other chronic osteomyelitis, left ankle and foot: Secondary | ICD-10-CM | POA: Diagnosis not present

## 2019-12-05 DIAGNOSIS — I87312 Chronic venous hypertension (idiopathic) with ulcer of left lower extremity: Secondary | ICD-10-CM | POA: Diagnosis not present

## 2019-12-05 DIAGNOSIS — L97524 Non-pressure chronic ulcer of other part of left foot with necrosis of bone: Secondary | ICD-10-CM | POA: Diagnosis not present

## 2019-12-05 DIAGNOSIS — E11622 Type 2 diabetes mellitus with other skin ulcer: Secondary | ICD-10-CM | POA: Diagnosis not present

## 2019-12-05 DIAGNOSIS — E1142 Type 2 diabetes mellitus with diabetic polyneuropathy: Secondary | ICD-10-CM | POA: Diagnosis not present

## 2019-12-05 DIAGNOSIS — L97511 Non-pressure chronic ulcer of other part of right foot limited to breakdown of skin: Secondary | ICD-10-CM | POA: Diagnosis not present

## 2019-12-05 DIAGNOSIS — I87333 Chronic venous hypertension (idiopathic) with ulcer and inflammation of bilateral lower extremity: Secondary | ICD-10-CM | POA: Diagnosis not present

## 2019-12-05 DIAGNOSIS — L97221 Non-pressure chronic ulcer of left calf limited to breakdown of skin: Secondary | ICD-10-CM | POA: Diagnosis not present

## 2019-12-05 DIAGNOSIS — L97211 Non-pressure chronic ulcer of right calf limited to breakdown of skin: Secondary | ICD-10-CM | POA: Diagnosis not present

## 2019-12-05 DIAGNOSIS — E1151 Type 2 diabetes mellitus with diabetic peripheral angiopathy without gangrene: Secondary | ICD-10-CM | POA: Diagnosis not present

## 2019-12-06 ENCOUNTER — Ambulatory Visit (INDEPENDENT_AMBULATORY_CARE_PROVIDER_SITE_OTHER): Payer: Medicare HMO | Admitting: *Deleted

## 2019-12-06 ENCOUNTER — Telehealth: Payer: Self-pay | Admitting: Podiatry

## 2019-12-06 DIAGNOSIS — I4891 Unspecified atrial fibrillation: Secondary | ICD-10-CM

## 2019-12-06 DIAGNOSIS — Z5181 Encounter for therapeutic drug level monitoring: Secondary | ICD-10-CM | POA: Diagnosis not present

## 2019-12-06 LAB — POCT INR: INR: 2.2 (ref 2.0–3.0)

## 2019-12-06 NOTE — Progress Notes (Addendum)
RAHM, MINIX ([id number]) Visit Report for 12/05/2019 Arrival Information Details Patient Name: Date of Service: [name], [name] 12/05/2019 8:30 AM Medical Record [place] Patient Account Number: [id number] Date of Birth/Sex: Treating RN: 1941-02-07 (79 y.o. [name] Primary Care Linh Johannes: Hurshel Party Other Clinician: Referring Tamsyn Owusu: Treating Davis Vannatter/Extender:Robson, Cheryl Flash, [place] Weeks in Treatment: 11 Visit Information History Since Last Visit Added or deleted any medications: No Patient Arrived: Wheel Chair Any new allergies or adverse reactions: No Arrival Time: 08:24 Had a fall or experienced change in No Accompanied By: spouse activities of daily living that may affect Transfer Assistance: None risk of falls: Patient Identification Verified: Yes Signs or symptoms of abuse/neglect since last No Secondary Verification Process Yes visito Completed: Hospitalized since last visit: No Patient Requires Transmission- No Implantable device outside of the clinic excluding No Based Precautions: cellular tissue based products placed in the center Patient Has Alerts: Yes since last visit: Patient Alerts: Patient on Blood Has Dressing in Place as Prescribed: Yes Thinner Has Compression in Place as Prescribed: Yes Pain Present Now: Yes Electronic Signature(s) Signed: 12/05/2019 6:17:27 PM By: Baruch Gouty RN, BSN Entered By: Baruch Gouty on 12/05/2019 08:30:17 -------------------------------------------------------------------------------- Compression Therapy Details Patient Name: Date of Service: Daniel Pia D. 12/05/2019 8:30 AM Medical Record [id number] Patient Account Number: [id number] Date of Birth/Sex: Treating RN: [birth year] (78 y.o. [name]) [name] Primary Care Raphael Fitzpatrick: Hurshel Party Other Clinician: Referring Ashlin Hidalgo: Treating Camy Leder/Extender:Robson, Cheryl Flash, [place] Weeks in Treatment:  40 Compression Therapy Performed for Wound Wound #19 Left,Anterior Lower Leg Assessment: Performed By: Jake Church, RN Compression Type: Rolena Infante Post Procedure Diagnosis Same as Pre-procedure Electronic Signature(s) Signed: 12/06/2019 7:20:02 AM By: [name] RN Entered By: [name] on 12/05/2019 09:14:38 -------------------------------------------------------------------------------- Compression Therapy Details Patient Name: Date of Service: [name], [name] 12/05/2019 8:30 AM Medical Record [id number] Patient Account Number: [id number] Date of Birth/Sex: Treating RN: [birth year] (78 y.o. [name]) [name] Primary Care Noelie Renfrow: Hurshel Party Other Clinician: Referring Michille Mcelrath: Treating Cesiah Westley/Extender:Robson, Cheryl Flash, [place] Weeks in Treatment: 40 Compression Therapy Performed for Wound Wound #24 Left,Distal,Anterior Lower Leg Assessment: Performed By: Jake Church, RN Compression Type: Rolena Infante Post Procedure Diagnosis Same as Pre-procedure Electronic Signature(s) Signed: 12/06/2019 7:20:02 AM By: [name] RN Entered By: [name] on 12/05/2019 09:14:38 -------------------------------------------------------------------------------- Compression Therapy Details Patient Name: Date of Service: [name], [name] 12/05/2019 8:30 AM Medical Record [id number] Patient Account Number: [id number] Date of Birth/Sex: Treating RN: [birth year] (78 y.o. [name]) [name] Primary Care Kanija Remmel: Hurshel Party Other Clinician: Referring Damonte Frieson: Treating Abhiram Criado/Extender:Robson, Cheryl Flash, [place] Weeks in Treatment: 40 Compression Therapy Performed for Wound Wound #25 Left,Distal,Posterior Lower Leg Assessment: Performed By: Jake Church, RN Compression Type: Rolena Infante Post Procedure Diagnosis Same as Pre-procedure Electronic Signature(s) Signed: 12/06/2019 7:20:02 AM By: [name] RN Entered By: [name] on 12/05/2019 09:14:38 -------------------------------------------------------------------------------- Compression Therapy Details Patient Name: Date of Service: [name], [name] 12/05/2019 8:30 AM Medical Record [id number] Patient Account Number: [id number] Date of Birth/Sex: Treating RN: [birth year] (78 y.o. [name]) [name] Primary Care Aniket Paye: Hurshel Party Other Clinician: Referring Sapphira Harjo: Treating Lynden Carrithers/Extender:Robson, Cheryl Flash, [place] Weeks in Treatment: 40 Compression Therapy Performed for Wound Wound #28 Left,Proximal,Posterior Lower Leg Assessment: Performed By: Jake Church, RN Compression Type: Rolena Infante Post Procedure Diagnosis Same as Pre-procedure Electronic Signature(s) Signed: 12/06/2019 7:20:02 AM By: [name] RN Entered By: [name] on 12/05/2019 09:14:38 -------------------------------------------------------------------------------- Compression Therapy Details Patient Name: Date of Service: Daniel Pia D. 12/05/2019 8:30 AM Medical  Record [id number] Patient Account Number: [id number] Date of Birth/Sex: Treating RN: November 19, 1940 (78 y.o. [name]) [name] Primary Care Gaylord Seydel: Hurshel Party Other Clinician: Referring Marvion Bastidas: Treating Yolinda Duerr/Extender:Robson, Cheryl Flash, [place] Weeks in Treatment: 40 Compression Therapy Performed for Wound Wound #3R Right,Posterior Calf Assessment: Performed By: Jake Church, RN Compression Type: Rolena Infante Post Procedure Diagnosis Same as Pre-procedure Electronic Signature(s) Signed: 12/06/2019 7:20:02 AM By: [name] RN Entered By: [name] on 12/05/2019 09:14:38 -------------------------------------------------------------------------------- Encounter Discharge Information Details Patient Name: Date of Service: Daniel Pia D. 12/05/2019 8:30 AM Medical Record [id number] Patient Account Number: [id number] Date of Birth/Sex: Treating  RN: Oct 05, 1940 (79 y.o. [name] Primary Care Shaasia Odle: Hurshel Party Other Clinician: Referring Lilton Pare: Treating Lariya Kinzie/Extender:Robson, Cheryl Flash, [place] Weeks in Treatment: 79 Encounter Discharge Information Items Discharge Condition: Stable Ambulatory Status: Wheelchair Discharge Destination: Home Transportation: Private Auto Accompanied By: self Schedule Follow-up Appointment: Yes Clinical Summary of Care: Patient Declined Electronic Signature(s) Signed: 12/05/2019 5:46:17 PM By: Kela Millin Entered By: Kela Millin on 12/05/2019 09:34:13 -------------------------------------------------------------------------------- Lower Extremity Assessment Details Patient Name: Date of Service: [name], [name] 12/05/2019 8:30 AM Medical Record [id number] Patient Account Number: [id number] Date of Birth/Sex: Treating RN: 07/19/1941 (79 y.o. [name] Primary Care Oryan Winterton: Hurshel Party Other Clinician: Referring Guyla Bless: Treating Anamari Galeas/Extender:Robson, Cheryl Flash, [place] Weeks in Treatment: 40 Edema Assessment Assessed: [Left: No] [Right: No] Edema: [Left: Yes] [Right: Yes] Calf Left: Right: Point of Measurement: 31 cm From Medial Instep 30.8 cm 30.6 cm Ankle Left: Right: Point of Measurement: 11 cm From Medial Instep 21 cm 21.4 cm Vascular Assessment Pulses: Dorsalis Pedis Palpable: [Left:No] [Right:No] Electronic Signature(s) Signed: 12/05/2019 6:17:27 PM By: Baruch Gouty RN, BSN Entered By: Baruch Gouty on 12/05/2019 08:46:46 -------------------------------------------------------------------------------- Multi Wound Chart Details Patient Name: Date of Service: Daniel Pia D. 12/05/2019 8:30 AM Medical Record [id number] Patient Account Number: [id number] Date of Birth/Sex: Treating RN: 11/25/1940 (78 y.o. [name]) [name] Primary Care Thales Knipple: Hurshel Party Other Clinician: Referring Eames Dibiasio:  Treating Keagan Brislin/Extender:Robson, Cheryl Flash, [place] Weeks in Treatment: 40 Vital Signs Height(in): 74 Pulse(bpm): 76 Weight(lbs): 212 Blood Pressure(mmHg): 117/74 Body Mass Index(BMI): 27 Temperature(F): 97.9 Respiratory 20 Rate(breaths/min): Photos: [13:No Photos] [19:No Photos] [21:No Photos] Wound Location: [13:Left Toe Second] [19:Left Lower Leg - Anterior Right Toe Second] Wounding Event: [13:Gradually Appeared] [19:Gradually Appeared] [21:Gradually Appeared] 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] [21:Diabetic Wound/Ulcer of the Lower Extremity] Secondary Etiology: [13:N/A] [19:N/A] [21:N/A] Comorbid History: [13:Cataracts, Hypertension, Cataracts, Hypertension, Cataracts, Hypertension, Peripheral Venous Disease, Peripheral Venous Disease, Peripheral Venous Disease, Type II Diabetes, Gout, Received Radiation] [19:Type II Diabetes, Gout,  Received Radiation] [21:Type II Diabetes, Gout, Received Radiation] Date Acquired: [13:08/22/2019] [19:09/23/2019] [21:09/23/2019] Weeks of Treatment: [13:15] [19:10] [21:10] Wound Status: [13:Open] [19:Open] [21:Open] Wound Recurrence: [13:No] [19:No] [21:No] Clustered Wound: [13:No] [19:No] [21:No] Clustered Quantity: [13:N/A] [19:1] [21:N/A] Measurements L x W x D 1.3x1.6x0.3 [19:1.4x0.8x0.1] [21:1.8x1.5x0.2] (cm) Area (cm) : [13:1.634] [19:0.88] [21:2.121] Volume (cm) : [13:0.49] [19:0.088] [21:0.424] % Reduction in Area: [13:46.90%] [19:98.40%] [21:-221.40%] % Reduction in Volume: -59.10% [19:98.40%] [21:-542.40%] Classification: [13:Grade 2] [19:Full Thickness Without Exposed Support Structures] [21:Grade 2] Exudate Amount: [13:Small] [19:Small] [21:Small] Exudate Type: [13:Serosanguineous] [19:Serosanguineous] [21:Serosanguineous] Exudate Color: [13:red, brown] [19:red, brown] [21:red, brown] Wound Margin: [13:Distinct, outline attached] [19:Flat and Intact] [21:Flat and  Intact] Granulation Amount: [13:None Present (0%)] [19:Large (67-100%)] [21:None Present (0%)] Granulation Quality: [13:N/A] [19:Red, Hyper-granulation] [21:N/A] Necrotic Amount: [13:Large (67-100%)] [19:None Present (0%)] [21:Large (67-100%)] Necrotic Tissue: [13:Adherent Slough] [19:N/A] [21:Eschar, Adherent Slough] Exposed Structures: [13:Fat Layer (  Subcutaneous Tissue) Exposed: Yes Joint: Yes Bone: Yes Fascia: No Tendon: No Muscle: No] [19:Fat Layer (Subcutaneous Tissue) Exposed: Yes Fascia: No Tendon: No Muscle: No Joint: No Bone: No] [21:Fat Layer (Subcutaneous Tissue) Exposed: Yes  Bone: Yes Fascia: No Tendon: No Muscle: No Joint: No] Epithelialization: [13:None] [19:Small (1-33%)] [21:None] Procedures Performed: [13:N/A] [19:Compression Therapy 24] [21:N/A 25] Photos: [13:No Photos] [19:No Photos] [21:No Photos] Wound Location: [13:Left Lower Leg - Anterior, Distal] [19:Left Lower Leg - Posterior, Distal] [21:Left Lower Leg - Posterior, Proximal] Wounding Event: [13:Gradually Appeared] [19:Gradually Appeared] [21:Gradually Appeared] Primary Etiology: [13:Venous Leg Ulcer] [19:Venous Leg Ulcer] [21:Diabetic Wound/Ulcer of the Lower Extremity] Secondary Etiology: [13:Diabetic Wound/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] [19:Lower Extremity] Comorbid History: [13:Cataracts, Hypertension, Cataracts, Hypertension, Cataracts, Hypertension, Peripheral Venous Disease, Peripheral Venous Disease, Peripheral Venous Disease, Type II Diabetes, Gout, Received Radiation] [19:Type II Diabetes, Gout,  Received Radiation] [21:Type II Diabetes, Gout, Received Radiation] Date Acquired: [13:10/04/2019] [19:10/04/2019] [21:10/31/2019] Weeks of Treatment: [13:8] [19:8] [21:5] Wound Status: [13:Open] [19:Open] [21:Open] Wound Recurrence: [13:No] [19:No] [21:No] Clustered Wound: [13:Yes] [19:Yes] [21:No] Clustered Quantity: [13:1] [19:1] [21:N/A] Measurements L x W x D 1x0.7x0.1  [19:1.5x1.1x0.1] [21:0.1x0.1x0.1] (cm) Area (cm) : [13:0.55] [19:1.296] [43:1.540] Volume (cm) : [13:0.055] [19:0.13] [21:0.001] % Reduction in Area: [13:91.40%] [19:94.80%] [21:98.60%] % Reduction in Volume: 91.40% [19:94.70%] [21:98.30%] Classification: [13:Full Thickness Without Exposed Support Structures Exposed Support Structures] [19:Full Thickness Without] [21:Grade 1] Exudate Amount: [13:Small] [19:Small] [21:Small] Exudate Type: [13:Serosanguineous] [19:Serosanguineous] [21:Serosanguineous] Exudate Color: [13:red, brown] [19:red, brown] [21:red, brown] Wound Margin: [13:Flat and Intact] [19:Flat and Intact] [21:Flat and Intact] Granulation Amount: [13:Large (67-100%)] [19:Medium (34-66%)] [21:Small (1-33%)] Granulation Quality: [13:Red] [19:Pink] [21:Pink] Necrotic Amount: [13:None Present (0%)] [19:Medium (34-66%)] [21:None Present (0%)] Necrotic Tissue: [13:N/A] [19:Adherent Slough] [21:N/A] Exposed Structures: [13:Fat Layer (Subcutaneous Fat Layer (Subcutaneous Fat Layer (Subcutaneous Tissue) Exposed: Yes Fascia: No Tendon: No Muscle: No Joint: No Bone: No] [19:Tissue) Exposed: Yes Fascia: No Tendon: No Muscle: No Joint: No Bone: No] [21:Tissue) Exposed: Yes  Fascia: No Tendon: No Muscle: No Joint: No Bone: No] Epithelialization: [13:Medium (34-66%)] [19:Small (1-33%)] [21:Large (67-100%)] Procedures Performed: Compression Therapy [19:Compression Therapy 3R N/A] [21:Compression Therapy] Photos: [13:No Photos] [19:N/A] [21:N/A] Wound Location: [13:Right Calf - Posterior] [19:N/A] [21:N/A] Wounding Event: [13:Gradually Appeared] [19:N/A] [21:N/A] Primary Etiology: [13:Diabetic Wound/Ulcer of the N/A Lower Extremity] [21:N/A] Secondary Etiology: [13:N/A] [19:N/A] [21:N/A] Comorbid History: [13:Cataracts, Hypertension, N/A Peripheral Venous Disease, Type II Diabetes, Gout, Received Radiation] [21:N/A] Date Acquired: [13:02/28/2019] [19:N/A] [21:N/A] Weeks of Treatment: [13:40]  [19:N/A] [21:N/A] Wound Status: [13:Open] [19:N/A] [21:N/A] Wound Recurrence: [13:Yes] [19:N/A] [21:N/A] Clustered Wound: [13:No] [19:N/A] [21:N/A] Clustered Quantity: [13:N/A] [19:N/A] [21:N/A] Measurements L x W x D 2.2x2.1x0.1 [19:N/A] [21:N/A] (cm) Area (cm) : [13:3.629] [19:N/A] [21:N/A] Volume (cm) : [13:0.363] [19:N/A] [21:N/A] % Reduction in Area: [13:85.20%] [19:N/A] [21:N/A] % Reduction in Volume: 85.20% [19:N/A] [21:N/A] Classification: [13:Grade 2] [19:N/A] [21:N/A] Exudate Amount: [13:Medium] [19:N/A] [21:N/A] Exudate Type: [13:Serosanguineous] [19:N/A] [21:N/A] Exudate Color: [13:red, brown] [19:N/A] [21:N/A] Wound Margin: [13:Distinct, outline attached N/A] [21:N/A] Granulation Amount: [13:Large (67-100%)] [19:N/A] [21:N/A] Granulation Quality: [13:Red] [19:N/A] [21:N/A] Necrotic Amount: [13:None Present (0%)] [19:N/A] [21:N/A] Necrotic Tissue: [13:N/A] [19:N/A] [21:N/A] Exposed Structures: [13:Fat Layer (Subcutaneous N/A Tissue) Exposed: Yes Fascia: No Tendon: No Muscle: No Joint: No Bone: No] [21:N/A] Epithelialization: [13:Medium (34-66%)] [19:N/A N/A] [21:N/A N/A] Treatment Notes Electronic Signature(s) Signed: 12/05/2019 5:48:31 PM By: [name] MD Signed: 12/06/2019 7:20:02 AM By: [name] RN Entered By: [name] on 12/05/2019 09:17:14 -------------------------------------------------------------------------------- Multi-Disciplinary Care Plan Details Patient Name:  Date of Service: ALBAN, MARUCCI 12/05/2019 8:30 AM Medical Record [place] Patient Account Number: [id number] Date of Birth/Sex: Treating RN: [birth year] (78 y.o. [name]) [name] Primary Care Tesla Bochicchio: Other Clinician: Hurshel Party Referring Mercedes Fort: Treating Marillyn Goren/Extender:Robson, Cheryl Flash, [place] Weeks in Treatment: 40 Active Inactive Wound/Skin Impairment Nursing Diagnoses: Knowledge deficit related to ulceration/compromised skin  integrity Goals: Patient/caregiver will verbalize understanding of skin care regimen Date Initiated: 02/28/2019 Target Resolution Date: 12/29/2019 Goal Status: Active Ulcer/skin breakdown will have a volume reduction of 30% by week 4 Date Initiated: 02/28/2019 Date Inactivated: 04/04/2019 Target Resolution Date: 03/31/2019 Goal Status: Met Ulcer/skin breakdown will have a volume reduction of 50% by week 8 Date Initiated: 04/04/2019 Date Inactivated: 05/09/2019 Target Resolution Date: 05/05/2019 Goal Status: Met Ulcer/skin breakdown will have a volume reduction of 80% by week 12 Date Initiated: 05/09/2019 Date Inactivated: 06/13/2019 Target Resolution Date: 06/09/2019 Unmet Goal Status: Unmet Reason: comorbities/new wounds Ulcer/skin breakdown will heal within 14 weeks Date Initiated: 06/13/2019 Date Inactivated: 07/11/2019 Target Resolution Date: 07/07/2019 Unmet Goal Status: Unmet Reason: comorbityies Interventions: Assess patient/caregiver ability to obtain necessary supplies Assess patient/caregiver ability to perform ulcer/skin care regimen upon admission and as needed Assess ulceration(s) every visit Notes: Electronic Signature(s) Signed: 12/06/2019 7:20:02 AM By: [name] RN Entered By: [name] on 12/05/2019 08:12:44 -------------------------------------------------------------------------------- Pain Assessment Details Patient Name: Date of Service: [name], [name] 12/05/2019 8:30 AM Medical Record [id number] Patient Account Number: [id number] Date of Birth/Sex: Treating RN: 25-Jun-1941 (79 y.o. [name] Primary Care Rebeca Valdivia: Hurshel Party Other Clinician: Referring Raya Mckinstry: Treating Synethia Endicott/Extender:Robson, Cheryl Flash, [place] Weeks in Treatment: 76 Active Problems Location of Pain Severity and Description of Pain Patient Has Paino Yes Site Locations Pain Location: Pain in Ulcers With Dressing Change: Yes Duration of the Pain. Constant /  Intermittento Intermittent Rate the pain. Current Pain Level: 0 Worst Pain Level: 4 Least Pain Level: 0 Character of Pain Describe the Pain: Aching Pain Management and Medication Current Pain Management: Medication: Yes Other: reposition Is the Current Pain Management Adequate: Adequate How does your wound impact your activities of daily livingo Sleep: No Bathing: No Appetite: No Relationship With Others: No Bladder Continence: No Emotions: No Bowel Continence: No Hobbies: No Toileting: No Dressing: No Electronic Signature(s) Signed: 12/05/2019 6:17:27 PM By: Baruch Gouty RN, BSN Entered By: Baruch Gouty on 12/05/2019 08:37:07 -------------------------------------------------------------------------------- Patient/Caregiver Education Details Patient Name: Date of Service: [name], Daniel D. 3/9/2021andnbsp8:30 AM Medical Record [phone] Patient Account Number: [id number] Date of Birth/Gender: Treating RN: Oct 26, 1940 (78 y.o. [name]) [name] Primary Care Physician: Hurshel Party Other Clinician: Referring Physician: Treating Physician/Extender:Robson, Cheryl Flash, [place] Weeks in Treatment: 74 Education Assessment Education Provided To: Patient Education Topics Provided Wound/Skin Impairment: Methods: Explain/Verbal Responses: State content correctly Electronic Signature(s) Signed: 12/06/2019 7:20:02 AM By: [name] RN Entered By: [name] on 12/05/2019 08:13:00 -------------------------------------------------------------------------------- Wound Assessment Details Patient Name: Date of Service: [name], [name] 12/05/2019 8:30 AM Medical Record [id number] Patient Account Number: [id number] Date of Birth/Sex: Treating RN: 1941/06/23 (78 y.o. [name]) [name] Primary Care Branon Sabine: Hurshel Party Other Clinician: Referring Zunaira Lamy: Treating Bailey Faiella/Extender:Robson, Cheryl Flash, [place] Weeks in Treatment: 40 Wound  Status Wound Number: 13 Primary Diabetic Wound/Ulcer of the Lower Extremity Etiology: Wound Location: Left Toe Second Wound Open Wounding Event: Gradually Appeared Status: Date Acquired: 08/22/2019 Comorbid Cataracts, Hypertension, Peripheral Venous Weeks Of Treatment: 15 History: Disease, Type II Diabetes, Gout, Received Clustered Wound: No Radiation Photos Wound Measurements Length: (cm) 1.3 % Reducti Width: (cm) 1.6 % Reducti Depth: (cm) 0.3  Epithelia Area: (cm) 1.634 Tunnelin Volume: (cm) 0.49 Undermin Wound Description Classification: Grade 2 Wound Margin: Distinct, outline attached Exudate Amount: Small Exudate Type: Serosanguineous Exudate Color: red, brown Wound Bed Granulation Amount: None Present (0%) Necrotic Amount: Large (67-100%) Necrotic Quality: Adherent Slough Foul Odor After Cleansing: No Slough/Fibrino Yes Exposed Structure Fascia Exposed: No Fat Layer (Subcutaneous Tissue) Exposed: Yes Tendon Exposed: No Muscle Exposed: No Joint Exposed: Yes Bone Exposed: Yes on in Area: 46.9% on in Volume: -59.1% lization: None g: No ing: No Treatment Notes Wound #13 (Left Toe Second) 1. Cleanse With Wound Cleanser 3. Primary Dressing Applied Calcium Alginate Ag 4. Secondary Dressing Dry Gauze Notes netting Electronic Signature(s) Signed: 12/11/2019 5:11:29 PM By: Mikeal Hawthorne EMT/HBOT Signed: 12/11/2019 5:26:01 PM By: [name] RN Previous Signature: 12/05/2019 6:17:27 PM Version By: Baruch Gouty RN, BSN Entered By: Mikeal Hawthorne on 12/11/2019 16:13:49 -------------------------------------------------------------------------------- Wound Assessment Details Patient Name: Date of Service: [name], [name] 12/05/2019 8:30 AM Medical Record [id number] Patient Account Number: [id number] Date of Birth/Sex: Treating RN: [birth year] (78 y.o. [name]) Dolores Lory, [place] Primary Care Cigi Bega: Hurshel Party Other Clinician: Referring Kadeja Granada: Treating  Treyden Hakim/Extender:Robson, Cheryl Flash, [place] Weeks in Treatment: 40 Wound Status Wound Number: 19 Primary Venous Leg Ulcer Etiology: Wound Location: Left Lower Leg - Anterior Wound Open Wounding Event: Gradually Appeared Status: Date Acquired: 09/23/2019 Comorbid Cataracts, Hypertension, Peripheral Venous Weeks Of Treatment: 10 History: Disease, Type II Diabetes, Gout, Received Clustered Wound: No Radiation Photos Wound Measurements Length: (cm) 1.4 % Reduct Width: (cm) 0.8 % Reduct Depth: (cm) 0.1 Epitheli Clustered Quantity: 1 Tunnelin Area: (cm) 0.88 Undermi Volume: (cm) 0.088 Wound Description Full Thickness Without Exposed Support Foul Odo Classification: Structures Slough/F Wound Flat and Intact Margin: Exudate Small Amount: Exudate Serosanguineous Type: Exudate red, brown Color: Wound Bed Granulation Amount: Large (67-100%) Granulation Quality: Red, Hyper-granulation Fascia E Necrotic Amount: None Present (0%) Fat Laye Tendon E Muscle E Joint Ex Bone Exp r After Cleansing: No ibrino No Exposed Structure xposed: No r (Subcutaneous Tissue) Exposed: Yes xposed: No xposed: No posed: No osed: No ion in Area: 98.4% ion in Volume: 98.4% alization: Small (1-33%) g: No ning: No Treatment Notes Wound #19 (Left, Anterior Lower Leg) 1. Cleanse With Wound Cleanser Soap and water 3. Primary Dressing Applied Hydrofera Blue 6. Support Layer Best boy) Signed: 12/11/2019 5:11:29 PM By: Mikeal Hawthorne EMT/HBOT Signed: 12/11/2019 5:26:01 PM By: [name] RN Previous Signature: 12/05/2019 6:17:27 PM Version By: Baruch Gouty RN, BSN Entered By: Mikeal Hawthorne on 12/11/2019 16:09:53 -------------------------------------------------------------------------------- Wound Assessment Details Patient Name: Date of Service: [name], [name] 12/05/2019 8:30 AM Medical Record [id number] Patient Account Number:  [id number] Date of Birth/Sex: Treating RN: 1941/08/23 (78 y.o. [name]) Dolores Lory, [place] Primary Care Eron Staat: Hurshel Party Other Clinician: Referring Burlon Centrella: Treating Adiba Fargnoli/Extender:Robson, Cheryl Flash, [place] Weeks in Treatment: 40 Wound Status Wound Number: 21 Primary Diabetic Wound/Ulcer of the Lower Extremity Etiology: Wound Location: Right Toe Second Wound Open Wounding Event: Gradually Appeared Status: Date Acquired: 09/23/2019 Comorbid Cataracts, Hypertension, Peripheral Venous Weeks Of Treatment: 10 History: Disease, Type II Diabetes, Gout, Received Clustered Wound: No Radiation Photos Wound Measurements Length: (cm) 1.8 % Reduction in A Width: (cm) 1.5 % Reduction in V Depth: (cm) 0.2 Epithelializatio Area: (cm) 2.121 Tunneling: Volume: (cm) 0.424 Undermining: Wound Description Classification: Grade 2 Foul Odor After Wound Margin: Flat and Intact Slough/Fibrino Exudate Amount: Small Exudate Type: Serosanguineous Exudate Color: red, brown Wound Bed Granulation Amount: None Present (0%) Necrotic Amount: Large (67-100%) Fascia Exposed: Necrotic  Quality: Eschar, Adherent Slough Fat Layer (Subcu Tendon Exposed: Muscle Exposed: Joint Exposed: Bone Exposed: Cleansing: No Yes Exposed Structure No taneous Tissue) Exposed: Yes No No No Yes rea: -221.4% olume: -542.4% n: None No No Treatment Notes Wound #21 (Right Toe Second) 1. Cleanse With Wound Cleanser 3. Primary Dressing Applied Calcium Alginate Ag 4. Secondary Dressing Dry Gauze Notes netting Electronic Signature(s) Signed: 12/11/2019 5:11:29 PM By: Mikeal Hawthorne EMT/HBOT Signed: 12/11/2019 5:26:01 PM By: [name] RN Previous Signature: 12/05/2019 6:17:27 PM Version By: Baruch Gouty RN, BSN Entered By: Mikeal Hawthorne on 12/11/2019 16:14:13 -------------------------------------------------------------------------------- Wound Assessment Details Patient Name: Date of  Service: [name], [name] 12/05/2019 8:30 AM Medical Record [id number] Patient Account Number: [id number] Date of Birth/Sex: Treating RN: 11-18-1940 (78 y.o. [name]) Dolores Lory, [place] Primary Care Ragina Fenter: [place] Party Other Clinician: Referring Kylie Simmonds: Treating Shermar Friedland/Extender:Robson, Cheryl Flash, [place] Weeks in Treatment: 40 Wound Status Wound Number: 24 Primary Venous Leg Ulcer Etiology: Wound Location: Left Lower Leg - Anterior, Distal Secondary Diabetic Wound/Ulcer of the Lower Extremity Wounding Event: Gradually Appeared Etiology: Date Acquired: 10/04/2019 Wound Open Weeks Of Treatment: 8 Status: Clustered Wound: Yes Comorbid Cataracts, Hypertension, Peripheral Venous History: Disease, Type II Diabetes, Gout, Received Radiation Photos Wound Measurements Length: (cm) 1 % Reducti Width: (cm) 0.7 % Reducti Depth: (cm) 0.1 Epithelia Clustered Quantity: 1 Tunneling Area: (cm) 0.55 Undermin Volume: (cm) 0.055 Wound Description Classification: Full Thickness Without Exposed Support Structures Wound Flat and Intact Margin: Exudate Small Amount: Exudate Serosanguineous Type: Exudate red, brown Color: Wound Bed Granulation Amount: Large (67-100%) Granulation Quality: Red Necrotic Amount: None Present (0%) Foul Odor After Cleansing: No Slough/Fibrino Yes Exposed Structure Fascia Exposed: No Fat Layer (Subcutaneous Tissue) Exposed: Yes Tendon Exposed: No Muscle Exposed: No Joint Exposed: No Bone Exposed: No on in Area: 91.4% on in Volume: 91.4% lization: Medium (34-66%) : No ing: No Treatment Notes Wound #24 (Left, Distal, Anterior Lower Leg) 1. Cleanse With Wound Cleanser Soap and water 3. Primary Dressing Applied Hydrofera Blue 6. Support Layer Best boy) Signed: 12/11/2019 5:11:29 PM By: Mikeal Hawthorne EMT/HBOT Signed: 12/11/2019 5:26:01 PM By: [name] RN Previous Signature: 12/05/2019 6:17:27 PM Version By:  Baruch Gouty RN, BSN Entered By: Mikeal Hawthorne on 12/11/2019 16:10:20 -------------------------------------------------------------------------------- Wound Assessment Details Patient Name: Date of Service: ERMIAS, TOMEO 12/05/2019 8:30 AM Medical Record [id number] Patient Account Number: [id number] Date of Birth/Sex: Treating RN: 25-Nov-1940 (78 y.o. [name]) Dolores Lory, [place] Primary Care Maleyah Evans: [place] Party Other Clinician: Referring Tiger Spieker: Treating Jerimah Witucki/Extender:Robson, Cheryl Flash, [place] Weeks in Treatment: 40 Wound Status Wound Number: 25 Primary Venous Leg Ulcer Etiology: Wound Location: Left Lower Leg - Posterior, Distal Secondary Diabetic Wound/Ulcer of the Lower Extremity Wounding Event: Gradually Appeared Etiology: Date Acquired: 10/04/2019 Date Acquired: 10/04/2019 Wound Open Weeks Of Treatment: 8 Status: Clustered Wound: Yes Comorbid Cataracts, Hypertension, Peripheral Venous History: Disease, Type II Diabetes, Gout, Received Radiation Photos Wound Measurements Length: (cm) 1.5 % Reduct Width: (cm) 1.1 % Reduct Depth: (cm) 0.1 Epitheli Clustered Quantity: 1 Tunnelin Area: (cm) 1.296 Undermi Volume: (cm) 0.13 Wound Description Classification: Full Thickness Without Exposed Support Foul Odo Structures Slough/F Wound Flat and Intact Margin: Exudate Small Amount: Exudate Serosanguineous Type: Exudate red, brown Color: Wound Bed Granulation Amount: Medium (34-66%) Granulation Quality: Pink Fascia E Necrotic Amount: Medium (34-66%) Fat Laye Necrotic Quality: Adherent Slough Tendon E Muscle E Joint Ex Bone Exp r After Cleansing: No ibrino No Exposed Structure xposed: No r (Subcutaneous Tissue) Exposed: Yes xposed: No xposed: No posed: No osed:  No ion in Area: 94.8% ion in Volume: 94.7% alization: Small (1-33%) g: No ning: No Treatment Notes Wound #25 (Left, Distal, Posterior Lower Leg) 1. Cleanse With Wound  Cleanser Soap and water 3. Primary Dressing Applied Hydrofera Blue 6. Support Layer Best boy) Signed: 12/11/2019 5:11:29 PM By: Mikeal Hawthorne EMT/HBOT Signed: 12/11/2019 5:26:01 PM By: [name] RN Previous Signature: 12/05/2019 6:17:27 PM Version By: Baruch Gouty RN, BSN Entered By: Mikeal Hawthorne on 12/11/2019 16:08:10 -------------------------------------------------------------------------------- Wound Assessment Details Patient Name: Date of Service: SAMIE, REASONS 12/05/2019 8:30 AM Medical Record [id number] Patient Account Number: [id number] Date of Birth/Sex: Treating RN: [birth year] (78 y.o. [name]) Dolores Lory, [place] Primary Care Kinzey Sheriff: [place] Party Other Clinician: Referring Jericca Russett: Treating Arvon Schreiner/Extender:Robson, Cheryl Flash, [place] Weeks in Treatment: 40 Wound Status Wound Number: 28 Primary Diabetic Wound/Ulcer of the Lower Extremity Etiology: Wound Location: Left Lower Leg - Posterior, Proximal Secondary Venous Leg Ulcer Etiology: Wounding Event: Gradually Appeared Wound Open Date Acquired: 10/31/2019 Status: Weeks Of Treatment: 5 Comorbid Cataracts, Hypertension, Peripheral Venous Clustered Wound: No History: Disease, Type II Diabetes, Gout, Received Radiation Photos Wound Measurements Length: (cm) 0.1 % Reductio Width: (cm) 0.1 % Reductio Depth: (cm) 0.1 Epithelial Area: (cm) 0.008 Tunneling Volume: (cm) 0.001 Undermini Wound Description Classification: Grade 1 Wound Margin: Flat and Intact Exudate Amount: Small Exudate Type: Serosanguineous Exudate Color: red, brown Wound Bed Granulation Amount: Small (1-33%) Granulation Quality: Pink Necrotic Amount: None Present (0%) Foul Odor After Cleansing: No Slough/Fibrino Yes Exposed Structure Fascia Exposed: No Fat Layer (Subcutaneous Tissue) Exposed: Yes Tendon Exposed: No Muscle Exposed: No Joint Exposed: No Bone Exposed: No n in Area: 98.6% n in  Volume: 98.3% ization: Large (67-100%) : No ng: No Treatment Notes Wound #28 (Left, Proximal, Posterior Lower Leg) 1. Cleanse With Wound Cleanser Soap and water 3. Primary Dressing Applied Hydrofera Blue 6. Support Layer Best boy) Signed: 12/11/2019 5:11:29 PM By: Mikeal Hawthorne EMT/HBOT Signed: 12/11/2019 5:26:01 PM By: [name] RN Previous Signature: 12/05/2019 6:17:27 PM Version By: Baruch Gouty RN, BSN Entered By: Mikeal Hawthorne on 12/11/2019 16:07:37 -------------------------------------------------------------------------------- Wound Assessment Details Patient Name: Date of Service: EULIS, SALAZAR 12/05/2019 8:30 AM Medical Record [id number] Patient Account Number: [id number] Date of Birth/Sex: Treating RN: [birth year] (78 y.o. [name]) Dolores Lory, [place] Primary Care Danyel Tobey: [place] Party Other Clinician: Referring Jamear Carbonneau: Treating Rether Rison/Extender:Robson, Cheryl Flash, [place] Weeks in Treatment: 40 Wound Status Wound Number: [id number] Primary Diabetic Wound/Ulcer of the Lower Extremity Etiology: Wound Location: Right Calf - Posterior Wound Open Wounding Event: Gradually Appeared Status: Date Acquired: 02/28/2019 Comorbid Cataracts, Hypertension, Peripheral Venous Weeks Of Treatment: 40 History: Disease, Type II Diabetes, Gout, Received Clustered Wound: No Radiation Photos Wound Measurements Length: (cm) 2.2 Width: (cm) 2.1 Depth: (cm) 0.1 Area: (cm) 3.629 Volume: (cm) 0.363 Wound Description Classification: Grade 2 Wound Margin: Distinct, outline attached Exudate Amount: Medium Exudate Type: Serosanguineous Exudate Color: red, brown Wound Bed Granulation Amount: Large (67-100%) Granulation Quality: Red Necrotic Amount: None Present (0%) After Cleansing: No brino No Exposed Structure posed: No (Subcutaneous Tissue) Exposed: Yes posed: No posed: No osed: No sed: No % Reduction in Area: 85.2% % Reduction in  Volume: 85.2% Epithelialization: Medium (34-66%) Tunneling: No Undermining: No Foul Odor Slough/Fi Fascia Ex Fat Layer Tendon Ex Muscle Ex Joint Exp Bone Expo Treatment Notes Wound #3R (Right, Posterior Calf) 1. Cleanse With Wound Cleanser Soap and water 3. Primary Dressing Applied Hydrofera Blue 6. Support Layer Best boy) Signed: 12/11/2019 5:11:29 PM By: Ronnald Ramp,  [place] EMT/HBOT Signed: 12/11/2019 5:26:01 PM By: [name] RN Previous Signature: 12/05/2019 6:17:27 PM Version By: Baruch Gouty RN, BSN Entered By: Mikeal Hawthorne on 12/11/2019 16:16:57 -------------------------------------------------------------------------------- Vitals Details Patient Name: Date of Service: Daniel Pia D. 12/05/2019 8:30 AM Medical Record [id number] Patient Account Number: [id number] Date of Birth/Sex: Treating RN: 08/26/1941 (79 y.o. [name] Primary Care Jayleene Glaeser: [place] Other Clinician: Referring Lazaria Schaben: Treating Siyana Erney/Extender:Robson, Cheryl Flash, [place] Weeks in Treatment: 40 Vital Signs Time Taken: 08:34 Temperature (F): 97.9 Height (in): 74 Pulse (bpm): 69 Source: Stated Respiratory Rate (breaths/min): 20 Weight (lbs): 212 Blood Pressure (mmHg): 117/74 Source: Stated Reference Range: 80 - 120 mg / dl Body Mass Index (BMI): 27.2 Electronic Signature(s) Signed: 12/05/2019 6:17:27 PM By: Baruch Gouty RN, BSN Entered By: Baruch Gouty on 12/05/2019 08:35:35

## 2019-12-06 NOTE — Patient Instructions (Signed)
Continue warfarin 1 tablet daily except 1/2 tablet on Saturdays Was scheduled for surgery to amputate toe but it was cancelled  Will reschedule for hospital OR Waiting surgical clearance from Dr Raliegh Ip. Recheck in 4 weeks

## 2019-12-06 NOTE — Progress Notes (Addendum)
ZACHARY, NOLE ([id number]) Visit Report for 12/05/2019 HPI Details Patient Name: Date of Service: KISHAUN, EREKSON 12/05/2019 8:30 AM Medical Record [place] Patient Account Number: [id number] Date of Birth/Sex: Treating RN: [birth year] (78 y.o. [name]) [name] Primary Care Provider: Hurshel Party Other Clinician: Referring Provider: Treating Provider/Extender:Jaydee Conran, Cheryl Flash, [place] Weeks in Treatment: 40 History of Present Illness Location: Patient presents with a wound to left lower leg. Quality: Patient reports No Pain. Duration: 2 months HPI Description: no cig or alcohol. spontaneous appearance in area of stasis dermamtitis. [name] on metformin only. chronic afib on Coumadin. diabetes and coag studies not good. hba1c 7.5. ivr 4.5. no pain or sxs of systemic disease. hx chf. no intermittent claudication 02/28/2019 Readmission This is a now a 79 year old man who was previously cared for in 2016 by Dr. Lindon Romp for wounds on his lower extremities. At that point he had venous reflux studies although I cannot seem to open these in [place] link. He had arterial studies showing an ABI of 1.11 on the right and 1.27 on the left his waveforms were triphasic bilaterally. He was discharged in stockings although I do not believe he is wearing these in some time. He tells me that about a month ago he noted openings of a large wound on the posterior right calf and 2 smaller areas on the left lateral calf and a small area more recently on the left posterior calf. He has been dressing these with peroxide and triple antibiotic ointment. He is not wearing compression. Past medical history; type 2 diabetes with peripheral neuropathy, chronic venous insufficiency, hypertension, cardiomyopathy, chronic atrial fibrillation on Coumadin, prostate cancer, hyperlipidemia, gout, ABI in our clinic was 1.34 on the left and not obtainable on the right 6/9; this is a patient who has chronic  venous insufficiency. He has a fairly substantial area on the right posterior calf, left lateral calf and a small area on the left posterior calf. On arrival last week he had very palpable popliteal and femoral pulses but nothing in his bilateral feet. Unfortunately we cannot get arterial studies until July 1 at Dr. Kennon Holter office. They live in [place]. We use silver alginate under Kerlix Coban 6/16; patient with chronic venous insufficiency with wounds on his bilateral lower extremities. When he came into our clinic he was discovered to have a complete absence of peripheral pedal pulses at either the dorsalis pedis or posterior tibial. He does have easily palpable femoral and popliteal pulses. He sees Dr. [name] tomorrow for noninvasive arterial tests. He may also require venous reflux evaluation although I do not view this as an urgent thing. We have been using silver alginate. His wound surfaces of cleaned up quite nicely 6/23; patient with chronic venous insufficiency with wounds on his bilateral lower extremities. His wounds all are somewhat better looking. He did go to Dr. Kennon Holter office but somehow ended up on the doctors schedule rather than being scheduled for noninvasive tests therefore his noninvasive tests are scheduled for July 1. We agree that he has venous insufficiency ulcers but I cannot feel any pulses in his lower extremities dictating the need for test. We are only using Kerlix and light Coban unfortunately this appears to be holding the edema 6/30; has his arterial studies tomorrow. We have been using Kerlix and light Coban will go to a more aggressive compression if the arterial studies will allow. We all agreed these are venous wounds however I cannot feel pulses at either the dorsalis pedis or posterior tibial  bilaterally. His wounds generally look some better including left lateral and right posterior. 7/7-Patient returns at 1 week in Kerlix/Coban to both legs, with  improvement, in the left lateral and right posterior lower leg wounds, ABI's are normal in both legs per vascular studies, TBI is also normal on both sides, we are using hydrofera blue to the wounds 7/14; patient's arterial studies from 2 weeks ago showed an ABI on the right at 1.03 with a TBI of 0.86. On the left the ABI was 1.06 with a TBI of 0.84. Notable for the fact that his arterial waveforms were monophasic in all of the lower extremity arteries suggesting some degree of arterial occlusive disease but in general this was felt to be fairly adequate for healing. His compression was increased from 2-3 layer which is appropriate. Dressing was changed to [facility] 7/21; patient's wounds are measuring smaller. The more substantial one on the right posterior calf, second 1 on the left lateral calf. Using [facility] on both wound areas 7/28; patient continues to make nice improvements. The area on the right posterior calf is smaller. Area on the left lateral calf also is smaller. We have been using Hydrofera Blue under compression. The patient will need compression stockings and we have measured him for these in the eventuality that these heal which really should not be too long from now 8/4-Patient continues to make improvement, the right posterior calf area smaller with rim of keratotic skin on one side, the left wound is definitely smaller and improving. 8/11-Returns at 1 week, after being in 3 layer compression on both legs, both wounds appear to be improving, making good progress, patient is happy, pain is also less especially in the right leg wound 8/18; the area on the left anterior lower leg is healed. On the right posterior leg the wound remains although the dimensions are a lot better. 8/25; he arrives in clinic today with a large body of open wound on the left lateral calf. All of the 3 wounds in this area are in close juxtaposition to each other. The story is that we  discharged him last week with no a wrap on the left leg. They went to [place] could not get in as they are only excepting phone orders or online orders for stockings hence they did not put any stocking on the left leg all week. They have something at home but the patient with that was either incapable or just did not put them on. Apparently these opened 1 morning after getting out of bed. The area on the right has no real change 9/1; patient has bilateral lower extremity wounds in the setting of severe chronic venous insufficiency and secondary lymphedema. He arrived last week with new areas on the left lateral lower leg after we did not wrap him and he did not use his stockings. Nevertheless the areas on the left look better today under compression. Posterior right calf does not really changed. We are using Hydrofera Blue on both areas under compression 9/15; bilateral lower extremity wounds in the setting of severe chronic venous insufficiency and secondary lymphedema. He has 20 to 30 mmHg below-knee compression stockings under the eventuality that these close over. We did get the left leg to close but he did not transition to a stocking and this reopened. There are 2 open areas on the left posterior lateral calf and one on the right. Both of these look satisfactory. Using [facility] 9/22; bilateral lower extremity wounds in the  setting of chronic venous insufficiency. 2 superficial areas on the left lateral calf. One on the right just above the Achilles area. We have good edema control we have been using Hydrofera Blue 9/29; the areas on the left lateral calf are healed. On the right just above the Achilles and tendon area things look a lot better small wounds one scabbed area. We have been using Hydrofera Blue. We can discharge him in his own stocking on the left still wrapping on the right. This is the second time we have healed the left leg but he did not put a stocking on last time.  Hopefully this will maintain the edema from chronic venous disease with secondary lymphedema 10/6; he comes in today having a stocking on the left leg. They had trouble getting it on there is a lot of increase in swelling 2 small open areas one anteriorly and one on the medial calf. They report a lot of difficulty getting the stocking on. Paradoxically the area on the right that we have been wrapping posteriorly is closed 10/13; he comes in today with wounds bilaterally including superficial areas on the left medial and left lateral calf. As well as the right posterior has reopened in the Achilles area superiorly. He still does not have his juxta lite stockings although truthfully we would not of been able to use them today anyway. Apparently have been ordered and paid for from [employer] although they have not been delivered 10/20; his area on the right is just the boat closed on the right posterior. Still has the area on the left lateral and a very tiny area on the left medial. He has his bilateral juxta lites although he is not ready for them this week. He tolerated the increase to 4 layer compression last week quite well 10/27; the area on the right posterior calf is once again closed. He has a superficial area on the left lateral calf that is still open. He has been using Hydrofera Blue and bilateral 4-layer compression. He can change to his own juxta lite stocking on the right and we are instructing him today 11/3; the area on the right posterior calf reopened according to the patient and his wife after they took off the stocking when they got home last week. Apparently scabbed over there is now a fairly substantial wound which looks pretty much the same. Our intake nurse noted that they were using the juxta lite stockings appropriately. I was really hoping I might be able to close him out today. He has 1 very tiny remaining area on the left lateral lower leg. 11/10; right posterior calf wound  measures smaller but is still open. We have been using Hydrofera Blue. On the left he has a small oval-shaped wound and he seems to have had another wound distally that is open and likely a blister. We are using Hydrofera Blue under compression 11/17; right posterior calf wound continues to get better. We have been using Hydrofera Blue. On the left lateral one of the wounds has closed still a small open area. We have been using Hydrofera Blue on this as well. Both areas have been under 4-layer compression Arrives in clinic today with some swelling in the dorsal foot on the right some erythema of his forefoot and toes. Initially when I looked at this I almost thought this was a sunburn distal to a wrap injury. 12/1; right posterior calf wound debrided with a curette. We have been using Hydrofera Blue on the  left anterior lateral he has an area across the mid tibia. Finally a small area on the left lateral lower calf. Finally he continues to have de-epithelialized areas on the dorsal aspect of his toes. Initially thought this might be a burn injury when I saw him 2 weeks ago. I now wonder about tinea. I have also reviewed his arterial studies which were really quite good in July/20 with normal TBI's and ABIs but monophasic waveforms 12/8; comes in today with worsening problems especially on the left leg where he now has a cluster of wounds in the left anterior mid tibia. Very poor edema control. I reduced him to 3 layer from 4 layer compression last week because of some concern about blood flow to his toes however he does not have good edema control on the left leg. Right leg edema control looks satisfactory. On the left he has a cluster of wounds anteriorly, small area on the left medial fifth met head and then the collection of areas on his toes which appear better On the right he has the original area on the right posterior calf, a new area right medially. His formal arterial studies from mid  July are noted below. He was evaluated by Dr.Berry ABI Findings: +---------+------------------+-----+----------+--------+ Right Rt Pressure (mmHg)IndexWaveform Comment  +---------+------------------+-----+----------+--------+ Brachial 176     +---------+------------------+-----+----------+--------+ ATA 176 0.99 monophasic  +---------+------------------+-----+----------+--------+ PTA 183 1.03 monophasic  +---------+------------------+-----+----------+--------+ PERO 172 0.97 monophasic  +---------+------------------+-----+----------+--------+ Great Toe153 0.86    +---------+------------------+-----+----------+--------+ +---------+------------------+-----+-----------+-------+ Left Lt Pressure (mmHg)IndexWaveform Comment +---------+------------------+-----+-----------+-------+ Brachial 178     +---------+------------------+-----+-----------+-------+ ATA 162 0.91 multiphasic  +---------+------------------+-----+-----------+-------+ PTA 188 1.06 multiphasic  +---------+------------------+-----+-----------+-------+ PERO 158 0.89 monophasic   +---------+------------------+-----+-----------+-------+ Great Toe150 0.84    +---------+------------------+-----+-----------+-------+ +-------+-----------+-----------+------------+------------+ ABI/TBIToday's ABIToday's TBIPrevious ABIPrevious TBI +-------+-----------+-----------+------------+------------+ Right 1.03 0.86 1.11   +-------+-----------+-----------+------------+------------+ Left 1.06 0.84 1.27   +-------+-----------+-----------+------------+------------+ Tibial waveforms somewhat difficult to record due to irregular heartbeat. Bilateral ABIs appear essentially unchanged compared to prior study on 06/21/15. Summary: Right: Resting right ankle-brachial index is within normal range. No evidence of significant right lower extremity arterial disease.  The right toe-brachial index is normal. Although ankle brachial indices are within normal limits (0.95-1.29), arterial Doppler waveforms at the ankle suggest some component of arterial occlusive disease. Left: Resting left ankle-brachial index is within normal range. No evidence of significant left lower extremity arterial disease. The left toe-brachial index is normal. Although ankle brachial indices are within normal limits (0.95-1.29), arterial Doppler waveforms at the ankle suggest some component of arterial occlusive disease. 12/15; the patient's area on the left mid tibia looks better. Right posterior calf also better. He has the area on the left foot as well. All of his toes look better I think this was tinea. We are using Hydrofera Blue everywhere else The patient was in urgent care yesterday with wheezing and shortness of breath. He got azithromycin and prednisone. He feels better. His lungs are currently clear to auscultation. He was not tested for Covid 19 12/29; the patient arrives in clinic today with quite a bit change. 2 weeks ago he only had areas on the left mid tibia right posterior calf with tinea pedis resolving between his toes. He arrives in clinic today with several areas on the dorsal toes on the right, dorsal left second toe. He has skin breakdown in the left medial calf probably from excess edema. Small area proximally in the medial calf. He has weeping edema fluid coming out of the skin excoriations on the left medial calf. He tells  me that he is having a cardiac catheterization next week. I had a quick look at [facility] health link. He was found to have an ejection fraction of 25% during the work-up for persistent atrial fibrillation. He saw his cardiology office yesterday seen by the nurse practitioner. She increased his carvedilol. He has not been on diuretics for apparently several months and indeed in the nurse practitioner Dietrich Pates notes she had knowledge of  this. 10/04/2019 on evaluation today patient presents as a walk-in visit concerning the fact that he did not have an appointment here for our clinic at this point. He actually had a cardiac catheterization earlier today and then came from there to here in order to be evaluated. With that being said unfortunately he is having significant cellulitis of his left lower extremity upon evaluation today this appears to have deteriorated even since last week's evaluation with Dr. Dellia Nims. The right lower extremity is actually doing okay I really see no evidence of deterioration at this point at those locations. In fact the right leg seems in general be doing quite well. Nonetheless I am concerned about infection and cellulitis of the left lower extremity and again considering his weakened heart I do not want him to develop into sepsis at all. He is also having some trouble breathing today and I understand according to nursing staff this is always the case to some degree. With that being said the patient unfortunately seems to be in my opinion a little bit worse even his wife feels like that may be the case today. Unsure exactly what is leading to this. Cardiac catheterization I did review the report which showed an ejection fraction of 25% he also had an LAD blockage of around 25% based on what I saw. With that being said there was no significant blockages that required stenting at this point he does have weakened cardiac muscles compared to normal. 10/05/2019; patient was seen yesterday in clinic. He was sent to the ER because of cellulitis of the left leg possibility. In the ER he was given 1 dose of IV Levaquin and discharged on Keflex. He came in the clinic initially for a nurse visit to rewrap his left leg. We did not look at the right leg today that is an Haematologist. The patient also had a cardiac cath. According to him there were no blockages but a very low ejection fraction. 1/12; back for an early  follow-up. The condition of the left lower leg is a lot better although there are multiple open areas. All of them with not a particularly viable surface. On the right posterior calf he has a single wound with a clean surface. He has a wound with exposed bone on the PIP of the left second toe dorsally. He has wounds on the dorsal right first second and third toes. His arterial studies were normal. 1/19; the patient has 3 open wounds on the left leg anteriorly in the mid tibia, distally and medially just above the ankle and posteriorly. On the right he has a small area dime sized on the right posterior calf. His edema control is a lot better. He still has wounds on the right second and left second toes. The left second toe has exposed bone. X- ray I did last week did not show evidence of osteomyelitis in the left foot. 1/26; patient with a multiplicity of wounds and problems. On the left he has a circular area on the left anterior mid tibia Left just above  the medical ankle -left 2nd toe pip -right 2nd toe right posterior calf 2/2; patient with a multiplicity of wounds and problems. He has severe chronic venous insufficiency and the wounds on his leg are all on the left left anterior left medial at the medial malleolus and left posterior. We have been using Iodoflex to these areas to help with ongoing debridement. He also has largely traumatic wounds on his toes this includes the left second with exposed bone. Bone culture I did last week showed Staph aureus which is methicillin sensitive. I discussed with him today the idea of an amputation of this toe because it is literally nonfunctional however he wants to try antibiotics. Antibiotic choice is complicated by the fact that he is on Coumadin. He also has wounds over the dorsal part of the right first which is close to closed. Right second toe has exposed bone and the right third toe at the base of the right third toe is just about closed as  well. We have been using silver alginate 2/9; patient with severe chronic venous insufficiency. He has large wounds on the left anterior mid tibia and on the left medial just above the ankle. I am concerned today about the depth of the area on the left mid tibia may be exposing into the muscle. He has small areas on the left posterior calf and on the right posterior calf although these do not look as threatening. He also has traumatic wounds on his toes. The right first and third are closed however the bilateral second toes have exposed bone. The area on the left is open into the distal interphalangeal joint. In my opinion these toes are nonsalvageable and will need amputation 2/16; patient with severe chronic venous insufficiency. He has wounds on the left anterior mid tibia left medial lower leg just above the ankle. He has small areas on the left posterior calf and a more prominent area on the right posterior calf. All of these are related to poorly controlled venous hypertension. He also had traumatic wounds on the PIPs of both second toes. Unfortunately both of these have exposed bone and in the case of the left this I think goes right into the joint itself. I do not think either 1 of these is salvageable. I have reviewed his arterial evaluation that was done in July last year. He also saw Dr. [name] in June. He felt these were venous stasis. Previously had segmental arterial pressures performed in 11/03/2014 which were normal. He was sent for for a follow-up arterial evaluation on July 1. On the right his ABIs were 1.03 with a TBI of 0.86 although his waveforms were monophasic on the left he had multiphasic waveforms at the ATA PTA but monophasic at the peroneal nevertheless his ABI was normal at 1.06 TBI also normal at 0.84. I think he has enough blood flow to heal an amputation which I think he needs of the left second toe and probably the right second toe as well 2/23; the patient is going  for his bilateral second toe amputation by Dr. Amalia Hailey on Friday. In the meantime is 3 areas on the left leg and the small area on the right posterior look better. We have been using silver collagen. 3/2; the patient's toe amputations were canceled because of cardiac concerns. Apparently this surgery will need to be done in the hospital although they do not have an appointment. In the meantime he is continues to have 3 areas on the left leg  one anteriorly and 2 smaller areas posteriorly on the left as well as the small posterior area on the right. We have been using silver collagen with compression 3/9; still no word on the second toe amputations bilaterally. 3 wounds on the left leg all look better and the area on the right posterior we have been using silver collagen I change this to [employer]) Signed: 12/05/2019 5:48:31 PM By: [name] MD Entered By: [name] on 12/05/2019 09:17:52 -------------------------------------------------------------------------------- Physical Exam Details Patient Name: Date of Service: Daniel Pia D. 12/05/2019 8:30 AM Medical Record [id number] Patient Account Number: [id number] Date of Birth/Sex: Treating RN: 08-20-1941 (78 y.o. [name] Primary Care Provider: Hurshel Party Other Clinician: Referring Provider: Treating Provider/Extender:Argil Mahl, Cheryl Flash, [place] Weeks in Treatment: 40 Constitutional Sitting or standing Blood Pressure is within target range for patient.. Pulse regular and within target range for patient.Marland Kitchen Respirations regular, non-labored and within target range.. Temperature is normal and within the target range for the patient.Marland Kitchen Appears in no distress. Respiratory work of breathing is normal. Cardiovascular Pedal pulses are palpable. His edema control is excellent. Psychiatric appears at normal baseline. Notes Wound exam; left anterior lower leg wound. This has slight hyper  granulation. The area on the left medial and left posterior look improved. Small area on the right posterior. Some surrounding erythema here but no tenderness. I do not think this is cellulitis Electronic Signature(s) Signed: 12/05/2019 5:48:31 PM By: [name] MD Entered By: [name] on 12/05/2019 09:19:04 -------------------------------------------------------------------------------- Physician Orders Details Patient Name: Date of Service: Daniel Pia D. 12/05/2019 8:30 AM Medical Record [id number] Patient Account Number: [id number] Date of Birth/Sex: Treating RN: 1941/03/24 (78 y.o. [name]) Dolores Lory, Morey Hummingbird Primary Care Provider: Hurshel Party Other Clinician: Referring Provider: Treating Provider/Extender:Carollyn Etcheverry, Cheryl Flash, [place] Weeks in Treatment: 17 Verbal / Phone Orders: No Diagnosis Coding ICD-10 Coding Code Description L97.211 Non-pressure chronic ulcer of right calf limited to breakdown of skin L97.221 Non-pressure chronic ulcer of left calf limited to breakdown of skin I87.333 Chronic venous hypertension (idiopathic) with ulcer and inflammation of bilateral lower extremity E11.51 Type 2 diabetes mellitus with diabetic peripheral angiopathy without gangrene E11.42 Type 2 diabetes mellitus with diabetic polyneuropathy L97.524 Non-pressure chronic ulcer of other part of left foot with necrosis of bone L97.511 Non-pressure chronic ulcer of other part of right foot limited to breakdown of skin V78.46 Chronic systolic (congestive) heart failure N62.952 Other chronic osteomyelitis, left ankle and foot Follow-up Appointments Return Appointment in 1 week. - Tuesday **********EXTRA TIME**************** Dressing Change Frequency Wound #13 Left Toe Second Change dressing every day. Wound #19 Left,Anterior Lower Leg Do not change entire dressing for one week. Wound #21 Right Toe Second Change dressing every day. Wound #24 Left,Distal,Anterior Lower Leg Do not  change entire dressing for one week. Wound #25 Left,Distal,Posterior Lower Leg Do not change entire dressing for one week. Wound #3R Right,Posterior Calf Do not change entire dressing for one week. Skin Barriers/Peri-Wound Care TCA Cream or Ointment Wound Cleansing May shower with protection. Primary Wound Dressing Wound #13 Left Toe Second Calcium Alginate with Silver Wound #19 Left,Anterior Lower Leg Hydrofera Blue Wound #21 Right Toe Second Calcium Alginate with Silver Wound #24 Left,Distal,Anterior Lower Leg Hydrofera Blue Wound #25 Left,Distal,Posterior Lower Leg Hydrofera Blue Wound #3R Right,Posterior Calf Hydrofera Blue Secondary Dressing Dry Gauze - secure toes with tape Edema Control Unna Boots Bilaterally Off-Loading Open toe surgical shoe to: - left and right foot Electronic Signature(s) Signed: 12/05/2019 5:48:31 PM By: [name] MD  Signed: 12/06/2019 7:20:02 AM By: [name] RN Entered By: [name] on 12/05/2019 09:10:43 -------------------------------------------------------------------------------- Problem List Details Patient Name: Date of Service: Daniel Pia D. 12/05/2019 8:30 AM Medical Record [id number] Patient Account Number: [id number] Date of Birth/Sex: Treating RN: [birth year] (78 y.o. [name]) [name] Primary Care Provider: Hurshel Party Other Clinician: Referring Provider: Treating Provider/Extender:Mccall Lomax, Cheryl Flash, [place] Weeks in Treatment: 76 Active Problems ICD-10 Evaluated Encounter Code Description Active Date Today Diagnosis L97.211 Non-pressure chronic ulcer of right calf limited to 02/28/2019 No Yes breakdown of skin L97.221 Non-pressure chronic ulcer of left calf limited to 02/28/2019 No Yes breakdown of skin I87.333 Chronic venous hypertension (idiopathic) with ulcer 02/28/2019 No Yes and inflammation of bilateral lower extremity E11.51 Type 2 diabetes mellitus with diabetic peripheral 02/28/2019 No  Yes angiopathy without gangrene E11.42 Type 2 diabetes mellitus with diabetic polyneuropathy 02/28/2019 No Yes L97.524 Non-pressure chronic ulcer of other part of left foot 10/10/2019 No Yes with necrosis of bone L97.511 Non-pressure chronic ulcer of other part of right foot 09/26/2019 No Yes limited to breakdown of skin M01.02 Chronic systolic (congestive) heart failure 10/05/2019 No Yes M86.672 Other chronic osteomyelitis, left ankle and foot 10/31/2019 No Yes Inactive Problems ICD-10 Code Description Active Date Inactive Date B35.3 Tinea pedis 09/05/2019 09/05/2019 L97.521 Non-pressure chronic ulcer of other part of left foot limited to 09/26/2019 09/26/2019 breakdown of skin Resolved Problems Electronic Signature(s) Signed: 12/05/2019 5:48:31 PM By: [name] MD Entered By: [name] on 12/05/2019 09:17:05 -------------------------------------------------------------------------------- Progress Note Details Patient Name: Date of Service: Daniel Pia D. 12/05/2019 8:30 AM Medical Record [id number] Patient Account Number: [id number] Date of Birth/Sex: Treating RN: 01-Oct-1940 (78 y.o. [name]) [name] Primary Care Provider: Hurshel Party Other Clinician: Referring Provider: Treating Provider/Extender:[place] Kempner, Cheryl Flash, [place] Weeks in Treatment: 40 Subjective History of Present Illness (HPI) The following HPI elements were documented for the patient's wound: Location: Patient presents with a wound to left lower leg. Quality: Patient reports No Pain. Duration: 2 months no cig or alcohol. spontaneous appearance in area of stasis dermamtitis. [name] on metformin only. chronic afib on Coumadin. diabetes and coag studies not good. hba1c 7.5. ivr 4.5. no pain or sxs of systemic disease. hx chf. no intermittent claudication 02/28/2019 Readmission This is a now a 79 year old man who was previously cared for in 2016 by Dr. Lindon Romp for wounds on his lower extremities. At  that point he had venous reflux studies although I cannot seem to open these in [place] link. He had arterial studies showing an ABI of 1.11 on the right and 1.27 on the left his waveforms were triphasic bilaterally. He was discharged in stockings although I do not believe he is wearing these in some time. He tells me that about a month ago he noted openings of a large wound on the posterior right calf and 2 smaller areas on the left lateral calf and a small area more recently on the left posterior calf. He has been dressing these with peroxide and triple antibiotic ointment. He is not wearing compression. Past medical history; type 2 diabetes with peripheral neuropathy, chronic venous insufficiency, hypertension, cardiomyopathy, chronic atrial fibrillation on Coumadin, prostate cancer, hyperlipidemia, gout, ABI in our clinic was 1.34 on the left and not obtainable on the right 6/9; this is a patient who has chronic venous insufficiency. He has a fairly substantial area on the right posterior calf, left lateral calf and a small area on the left posterior calf. On arrival last week he had very palpable popliteal  and femoral pulses but nothing in his bilateral feet. Unfortunately we cannot get arterial studies until July 1 at Dr. Kennon Holter office. They live in [place]. We use silver alginate under Kerlix Coban 6/16; patient with chronic venous insufficiency with wounds on his bilateral lower extremities. When he came into our clinic he was discovered to have a complete absence of peripheral pedal pulses at either the dorsalis pedis or posterior tibial. He does have easily palpable femoral and popliteal pulses. He sees Dr. [name] tomorrow for noninvasive arterial tests. He may also require venous reflux evaluation although I do not view this as an urgent thing. We have been using silver alginate. His wound surfaces of cleaned up quite nicely 6/23; patient with chronic venous insufficiency with  wounds on his bilateral lower extremities. His wounds all are somewhat better looking. He did go to Dr. Kennon Holter office but somehow ended up on the doctors schedule rather than being scheduled for noninvasive tests therefore his noninvasive tests are scheduled for July 1. We agree that he has venous insufficiency ulcers but I cannot feel any pulses in his lower extremities dictating the need for test. We are only using Kerlix and light Coban unfortunately this appears to be holding the edema 6/30; has his arterial studies tomorrow. We have been using Kerlix and light Coban will go to a more aggressive compression if the arterial studies will allow. We all agreed these are venous wounds however I cannot feel pulses at either the dorsalis pedis or posterior tibial bilaterally. His wounds generally look some better including left lateral and right posterior. 7/7-Patient returns at 1 week in Kerlix/Coban to both legs, with improvement, in the left lateral and right posterior lower leg wounds, ABI's are normal in both legs per vascular studies, TBI is also normal on both sides, we are using hydrofera blue to the wounds 7/14; patient's arterial studies from 2 weeks ago showed an ABI on the right at 1.03 with a TBI of 0.86. On the left the ABI was 1.06 with a TBI of 0.84. Notable for the fact that his arterial waveforms were monophasic in all of the lower extremity arteries suggesting some degree of arterial occlusive disease but in general this was felt to be fairly adequate for healing. His compression was increased from 2-3 layer which is appropriate. Dressing was changed to [facility] 7/21; patient's wounds are measuring smaller. The more substantial one on the right posterior calf, second 1 on the left lateral calf. Using [facility] on both wound areas 7/28; patient continues to make nice improvements. The area on the right posterior calf is smaller. Area on the left lateral calf also is  smaller. We have been using Hydrofera Blue under compression. The patient will need compression stockings and we have measured him for these in the eventuality that these heal which really should not be too long from now 8/4-Patient continues to make improvement, the right posterior calf area smaller with rim of keratotic skin on one side, the left wound is definitely smaller and improving. 8/11-Returns at 1 week, after being in 3 layer compression on both legs, both wounds appear to be improving, making good progress, patient is happy, pain is also less especially in the right leg wound 8/18; the area on the left anterior lower leg is healed. On the right posterior leg the wound remains although the dimensions are a lot better. 8/25; he arrives in clinic today with a large body of open wound on the left lateral calf.  All of the 3 wounds in this area are in close juxtaposition to each other. The story is that we discharged him last week with no a wrap on the left leg. They went to [place] could not get in as they are only excepting phone orders or online orders for stockings hence they did not put any stocking on the left leg all week. They have something at home but the patient with that was either incapable or just did not put them on. Apparently these opened 1 morning after getting out of bed. The area on the right has no real change 9/1; patient has bilateral lower extremity wounds in the setting of severe chronic venous insufficiency and secondary lymphedema. He arrived last week with new areas on the left lateral lower leg after we did not wrap him and he did not use his stockings. Nevertheless the areas on the left look better today under compression. Posterior right calf does not really changed. We are using Hydrofera Blue on both areas under compression 9/15; bilateral lower extremity wounds in the setting of severe chronic venous insufficiency and secondary lymphedema. He has 20 to 30  mmHg below-knee compression stockings under the eventuality that these close over. We did get the left leg to close but he did not transition to a stocking and this reopened. There are 2 open areas on the left posterior lateral calf and one on the right. Both of these look satisfactory. Using [facility] 9/22; bilateral lower extremity wounds in the setting of chronic venous insufficiency. 2 superficial areas on the left lateral calf. One on the right just above the Achilles area. We have good edema control we have been using Hydrofera Blue 9/29; the areas on the left lateral calf are healed. On the right just above the Achilles and tendon area things look a lot better small wounds one scabbed area. We have been using Hydrofera Blue. We can discharge him in his own stocking on the left still wrapping on the right. This is the second time we have healed the left leg but he did not put a stocking on last time. Hopefully this will maintain the edema from chronic venous disease with secondary lymphedema 10/6; he comes in today having a stocking on the left leg. They had trouble getting it on there is a lot of increase in swelling 2 small open areas one anteriorly and one on the medial calf. They report a lot of difficulty getting the stocking on. ooParadoxically the area on the right that we have been wrapping posteriorly is closed 10/13; he comes in today with wounds bilaterally including superficial areas on the left medial and left lateral calf. As well as the right posterior has reopened in the Achilles area superiorly. He still does not have his juxta lite stockings although truthfully we would not of been able to use them today anyway. Apparently have been ordered and paid for from [employer] although they have not been delivered 10/20; his area on the right is just the boat closed on the right posterior. Still has the area on the left lateral and a very tiny area on the left medial. He has his  bilateral juxta lites although he is not ready for them this week. He tolerated the increase to 4 layer compression last week quite well 10/27; the area on the right posterior calf is once again closed. He has a superficial area on the left lateral calf that is still open. He has been using Hydrofera  Blue and bilateral 4-layer compression. He can change to his own juxta lite stocking on the right and we are instructing him today 11/3; the area on the right posterior calf reopened according to the patient and his wife after they took off the stocking when they got home last week. Apparently scabbed over there is now a fairly substantial wound which looks pretty much the same. Our intake nurse noted that they were using the juxta lite stockings appropriately. I was really hoping I might be able to close him out today. He has 1 very tiny remaining area on the left lateral lower leg. 11/10; right posterior calf wound measures smaller but is still open. We have been using Hydrofera Blue. On the left he has a small oval-shaped wound and he seems to have had another wound distally that is open and likely a blister. We are using Hydrofera Blue under compression 11/17; right posterior calf wound continues to get better. We have been using Hydrofera Blue. On the left lateral one of the wounds has closed still a small open area. We have been using Hydrofera Blue on this as well. Both areas have been under 4-layer compression Arrives in clinic today with some swelling in the dorsal foot on the right some erythema of his forefoot and toes. Initially when I looked at this I almost thought this was a sunburn distal to a wrap injury. 12/1; right posterior calf wound debrided with a curette. We have been using Hydrofera Blue on the left anterior lateral he has an area across the mid tibia. Finally a small area on the left lateral lower calf. Finally he continues to have de-epithelialized areas on the dorsal  aspect of his toes. Initially thought this might be a burn injury when I saw him 2 weeks ago. I now wonder about tinea. I have also reviewed his arterial studies which were really quite good in July/20 with normal TBI's and ABIs but monophasic waveforms 12/8; comes in today with worsening problems especially on the left leg where he now has a cluster of wounds in the left anterior mid tibia. Very poor edema control. I reduced him to 3 layer from 4 layer compression last week because of some concern about blood flow to his toes however he does not have good edema control on the left leg. Right leg edema control looks satisfactory. ooOn the left he has a cluster of wounds anteriorly, small area on the left medial fifth met head and then the collection of areas on his toes which appear better ooOn the right he has the original area on the right posterior calf, a new area right medially. His formal arterial studies from mid July are noted below. He was evaluated by Dr.Berry ABI Findings: +---------+------------------+-----+----------+--------+ Right Rt Pressure (mmHg)IndexWaveform Comment  +---------+------------------+-----+----------+--------+ Brachial 176     +---------+------------------+-----+----------+--------+ ATA 176 0.99 monophasic  +---------+------------------+-----+----------+--------+ PTA 183 1.03 monophasic  +---------+------------------+-----+----------+--------+ PERO 172 0.97 monophasic  +---------+------------------+-----+----------+--------+ Great Toe153 0.86    +---------+------------------+-----+----------+--------+ +---------+------------------+-----+-----------+-------+ Left Lt Pressure (mmHg)IndexWaveform Comment +---------+------------------+-----+-----------+-------+ Brachial 178     +---------+------------------+-----+-----------+-------+ ATA 162 0.91 multiphasic   +---------+------------------+-----+-----------+-------+ PTA 188 1.06 multiphasic  +---------+------------------+-----+-----------+-------+ PERO 158 0.89 monophasic   +---------+------------------+-----+-----------+-------+ Great Toe150 0.84    +---------+------------------+-----+-----------+-------+ +-------+-----------+-----------+------------+------------+ ABI/TBIToday's ABIToday's TBIPrevious ABIPrevious TBI +-------+-----------+-----------+------------+------------+ Right 1.03 0.86 1.11   +-------+-----------+-----------+------------+------------+ Left 1.06 0.84 1.27   +-------+-----------+-----------+------------+------------+ Tibial waveforms somewhat difficult to record due to irregular heartbeat. Bilateral ABIs appear essentially unchanged compared to prior study on 06/21/15. Summary: Right: Resting right ankle-brachial  index is within normal range. No evidence of significant right lower extremity arterial disease. The right toe-brachial index is normal. Although ankle brachial indices are within normal limits (0.95-1.29), arterial Doppler waveforms at the ankle suggest some component of arterial occlusive disease. Left: Resting left ankle-brachial index is within normal range. No evidence of significant left lower extremity arterial disease. The left toe-brachial index is normal. Although ankle brachial indices are within normal limits (0.95-1.29), arterial Doppler waveforms at the ankle suggest some component of arterial occlusive disease. 12/15; the patient's area on the left mid tibia looks better. Right posterior calf also better. He has the area on the left foot as well. All of his toes look better I think this was tinea. We are using Hydrofera Blue everywhere else The patient was in urgent care yesterday with wheezing and shortness of breath. He got azithromycin and prednisone. He feels better. His lungs are currently clear to  auscultation. He was not tested for Covid 19 12/29; the patient arrives in clinic today with quite a bit change. 2 weeks ago he only had areas on the left mid tibia right posterior calf with tinea pedis resolving between his toes. He arrives in clinic today with several areas on the dorsal toes on the right, dorsal left second toe. He has skin breakdown in the left medial calf probably from excess edema. Small area proximally in the medial calf. He has weeping edema fluid coming out of the skin excoriations on the left medial calf. He tells me that he is having a cardiac catheterization next week. I had a quick look at [facility] health link. He was found to have an ejection fraction of 25% during the work-up for persistent atrial fibrillation. He saw his cardiology office yesterday seen by the nurse practitioner. She increased his carvedilol. He has not been on diuretics for apparently several months and indeed in the nurse practitioner Dietrich Pates notes she had knowledge of this. 10/04/2019 on evaluation today patient presents as a walk-in visit concerning the fact that he did not have an appointment here for our clinic at this point. He actually had a cardiac catheterization earlier today and then came from there to here in order to be evaluated. With that being said unfortunately he is having significant cellulitis of his left lower extremity upon evaluation today this appears to have deteriorated even since last week's evaluation with Dr. Dellia Nims. The right lower extremity is actually doing okay I really see no evidence of deterioration at this point at those locations. In fact the right leg seems in general be doing quite well. Nonetheless I am concerned about infection and cellulitis of the left lower extremity and again considering his weakened heart I do not want him to develop into sepsis at all. He is also having some trouble breathing today and I understand according to nursing staff this is  always the case to some degree. With that being said the patient unfortunately seems to be in my opinion a little bit worse even his wife feels like that may be the case today. Unsure exactly what is leading to this. Cardiac catheterization I did review the report which showed an ejection fraction of 25% he also had an LAD blockage of around 25% based on what I saw. With that being said there was no significant blockages that required stenting at this point he does have weakened cardiac muscles compared to normal. 10/05/2019; patient was seen yesterday in clinic. He was sent to the ER because  of cellulitis of the left leg possibility. In the ER he was given 1 dose of IV Levaquin and discharged on Keflex. He came in the clinic initially for a nurse visit to rewrap his left leg. We did not look at the right leg today that is an Haematologist. The patient also had a cardiac cath. According to him there were no blockages but a very low ejection fraction. 1/12; back for an early follow-up. The condition of the left lower leg is a lot better although there are multiple open areas. All of them with not a particularly viable surface. On the right posterior calf he has a single wound with a clean surface. He has a wound with exposed bone on the PIP of the left second toe dorsally. He has wounds on the dorsal right first second and third toes. His arterial studies were normal. 1/19; the patient has 3 open wounds on the left leg anteriorly in the mid tibia, distally and medially just above the ankle and posteriorly. On the right he has a small area dime sized on the right posterior calf. His edema control is a lot better. He still has wounds on the right second and left second toes. The left second toe has exposed bone. X- ray I did last week did not show evidence of osteomyelitis in the left foot. 1/26; patient with a multiplicity of wounds and problems. ooOn the left he has a circular area on the left anterior  mid tibia ooLeft just above the medical ankle -left 2nd toe pip -right 2nd toe right posterior calf 2/2; patient with a multiplicity of wounds and problems. He has severe chronic venous insufficiency and the wounds on his leg are all on the left left anterior left medial at the medial malleolus and left posterior. We have been using Iodoflex to these areas to help with ongoing debridement. He also has largely traumatic wounds on his toes this includes the left second with exposed bone. Bone culture I did last week showed Staph aureus which is methicillin sensitive. I discussed with him today the idea of an amputation of this toe because it is literally nonfunctional however he wants to try antibiotics. Antibiotic choice is complicated by the fact that he is on Coumadin. He also has wounds over the dorsal part of the right first which is close to closed. Right second toe has exposed bone and the right third toe at the base of the right third toe is just about closed as well. We have been using silver alginate 2/9; patient with severe chronic venous insufficiency. He has large wounds on the left anterior mid tibia and on the left medial just above the ankle. I am concerned today about the depth of the area on the left mid tibia may be exposing into the muscle. He has small areas on the left posterior calf and on the right posterior calf although these do not look as threatening. He also has traumatic wounds on his toes. The right first and third are closed however the bilateral second toes have exposed bone. The area on the left is open into the distal interphalangeal joint. In my opinion these toes are nonsalvageable and will need amputation 2/16; patient with severe chronic venous insufficiency. He has wounds on the left anterior mid tibia left medial lower leg just above the ankle. He has small areas on the left posterior calf and a more prominent area on the right posterior calf. All of  these are related  to poorly controlled venous hypertension. He also had traumatic wounds on the PIPs of both second toes. Unfortunately both of these have exposed bone and in the case of the left this I think goes right into the joint itself. I do not think either 1 of these is salvageable. I have reviewed his arterial evaluation that was done in July last year. He also saw Dr. [name] in June. He felt these were venous stasis. Previously had segmental arterial pressures performed in 11/03/2014 which were normal. He was sent for for a follow-up arterial evaluation on July 1. On the right his ABIs were 1.03 with a TBI of 0.86 although his waveforms were monophasic on the left he had multiphasic waveforms at the ATA PTA but monophasic at the peroneal nevertheless his ABI was normal at 1.06 TBI also normal at 0.84. I think he has enough blood flow to heal an amputation which I think he needs of the left second toe and probably the right second toe as well 2/23; the patient is going for his bilateral second toe amputation by Dr. Amalia Hailey on Friday. In the meantime is 3 areas on the left leg and the small area on the right posterior look better. We have been using silver collagen. 3/2; the patient's toe amputations were canceled because of cardiac concerns. Apparently this surgery will need to be done in the hospital although they do not have an appointment. In the meantime he is continues to have 3 areas on the left leg one anteriorly and 2 smaller areas posteriorly on the left as well as the small posterior area on the right. We have been using silver collagen with compression 3/9; still no word on the second toe amputations bilaterally. 3 wounds on the left leg all look better and the area on the right posterior we have been using silver collagen I change this to Hydrofera Blue Objective Constitutional Sitting or standing Blood Pressure is within target range for patient.. Pulse regular and within target  range for patient.Marland Kitchen Respirations regular, non-labored and within target range.. Temperature is normal and within the target range for the patient.Marland Kitchen Appears in no distress. Vitals Time Taken: 8:34 AM, Height: 74 in, Source: Stated, Weight: 212 lbs, Source: Stated, BMI: 27.2, Temperature: 97.9 F, Pulse: 69 bpm, Respiratory Rate: 20 breaths/min, Blood Pressure: 117/74 mmHg. Respiratory work of breathing is normal. Cardiovascular Pedal pulses are palpable. His edema control is excellent. Psychiatric appears at normal baseline. General Notes: Wound exam; left anterior lower leg wound. This has slight hyper granulation. The area on the left medial and left posterior look improved. Small area on the right posterior. Some surrounding erythema here but no tenderness. I do not think this is cellulitis Integumentary (Hair, Skin) Wound #13 status is Open. Original cause of wound was Gradually Appeared. The wound is located on the Left Toe Second. The wound measures 1.3cm length x 1.6cm width x 0.3cm depth; 1.634cm^2 area and 0.49cm^3 volume. There is bone, joint, and Fat Layer (Subcutaneous Tissue) Exposed exposed. There is no tunneling or undermining noted. There is a small amount of serosanguineous drainage noted. The wound margin is distinct with the outline attached to the wound base. There is no granulation within the wound bed. There is a large (67-100%) amount of necrotic tissue within the wound bed including Adherent Slough. Wound #19 status is Open. Original cause of wound was Gradually Appeared. The wound is located on the Left,Anterior Lower Leg. The wound measures 1.4cm length x 0.8cm width x 0.1cm  depth; 0.88cm^2 area and 0.088cm^3 volume. There is Fat Layer (Subcutaneous Tissue) Exposed exposed. There is no tunneling or undermining noted. There is a small amount of serosanguineous drainage noted. The wound margin is flat and intact. There is large (67-100%) red, hyper - granulation  within the wound bed. There is no necrotic tissue within the wound bed. Wound #21 status is Open. Original cause of wound was Gradually Appeared. The wound is located on the Right Toe Second. The wound measures 1.8cm length x 1.5cm width x 0.2cm depth; 2.121cm^2 area and 0.424cm^3 volume. There is bone and Fat Layer (Subcutaneous Tissue) Exposed exposed. There is no tunneling or undermining noted. There is a small amount of serosanguineous drainage noted. The wound margin is flat and intact. There is no granulation within the wound bed. There is a large (67-100%) amount of necrotic tissue within the wound bed including Eschar and Adherent Slough. Wound #24 status is Open. Original cause of wound was Gradually Appeared. The wound is located on the [facility] Lower Leg. The wound measures 1cm length x 0.7cm width x 0.1cm depth; 0.55cm^2 area and 0.055cm^3 volume. There is Fat Layer (Subcutaneous Tissue) Exposed exposed. There is no tunneling or undermining noted. There is a small amount of serosanguineous drainage noted. The wound margin is flat and intact. There is large (67-100%) red granulation within the wound bed. There is no necrotic tissue within the wound bed. Wound #25 status is Open. Original cause of wound was Gradually Appeared. The wound is located on the Left,Distal,Posterior Lower Leg. The wound measures 1.5cm length x 1.1cm width x 0.1cm depth; 1.296cm^2 area and 0.13cm^3 volume. There is Fat Layer (Subcutaneous Tissue) Exposed exposed. There is no tunneling or undermining noted. There is a small amount of serosanguineous drainage noted. The wound margin is flat and intact. There is medium (34-66%) pink granulation within the wound bed. There is a medium (34-66%) amount of necrotic tissue within the wound bed including Adherent Slough. Wound #28 status is Open. Original cause of wound was Gradually Appeared. The wound is located on the Left,Proximal,Posterior Lower Leg.  The wound measures 0.1cm length x 0.1cm width x 0.1cm depth; 0.008cm^2 area and 0.001cm^3 volume. There is Fat Layer (Subcutaneous Tissue) Exposed exposed. There is no tunneling or undermining noted. There is a small amount of serosanguineous drainage noted. The wound margin is flat and intact. There is small (1-33%) pink granulation within the wound bed. There is no necrotic tissue within the wound bed. Wound #3R status is Open. Original cause of wound was Gradually Appeared. The wound is located on the Right,Posterior Calf. The wound measures 2.2cm length x 2.1cm width x 0.1cm depth; 3.629cm^2 area and 0.363cm^3 volume. There is Fat Layer (Subcutaneous Tissue) Exposed exposed. There is no tunneling or undermining noted. There is a medium amount of serosanguineous drainage noted. The wound margin is distinct with the outline attached to the wound base. There is large (67-100%) red granulation within the wound bed. There is no necrotic tissue within the wound bed. 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 There was a Haematologist Compression Therapy Procedure by [name], RN. Post procedure Diagnosis Wound #19: Same as Pre-Procedure Wound #24 Pre-procedure diagnosis  of Wound #24 is a Venous Leg Ulcer located on the Left,Distal,Anterior Lower Leg . There was a Haematologist Compression Therapy Procedure by [name], RN. Post procedure Diagnosis Wound #24: Same as Pre-Procedure Wound #25 Pre-procedure diagnosis of Wound #25 is a Venous Leg Ulcer located on the Left,Distal,Posterior Lower Leg . There was a Haematologist Compression Therapy Procedure by [name], RN. Post procedure Diagnosis Wound #25: Same as Pre-Procedure Wound #28 Pre-procedure diagnosis of Wound #28 is a Diabetic Wound/Ulcer of the Lower Extremity located on the Left,Proximal,Posterior Lower Leg . There was a Haematologist Compression Therapy Procedure by [name], RN. Post procedure Diagnosis Wound #28: Same as Pre-Procedure Wound #3R Pre-procedure diagnosis of Wound #3R is a Diabetic Wound/Ulcer of the Lower Extremity located on the Right,Posterior Calf . There was a Haematologist Compression Therapy Procedure by [name], RN. Post procedure Diagnosis Wound #3R: Same as Pre-Procedure Plan Follow-up Appointments: Return Appointment in 1 week. - Tuesday **********EXTRA TIME**************** Dressing Change Frequency: Wound #13 Left Toe Second: Change dressing every day. Wound #19 Left,Anterior Lower Leg: Do not change entire dressing for one week. Wound #21 Right Toe Second: Change dressing every day. Wound #24 Left,Distal,Anterior Lower Leg: Do not change entire dressing for one week. Wound #25 Left,Distal,Posterior Lower Leg: Do not change entire dressing for one week. Wound #3R Right,Posterior Calf: Do not change entire dressing for one week. Skin Barriers/Peri-Wound Care: TCA Cream or Ointment Wound Cleansing: May shower with protection. Primary Wound Dressing: Wound #13 Left Toe Second: Calcium Alginate with Silver Wound #19 Left,Anterior Lower Leg: Hydrofera Blue Wound #21 Right Toe Second: Calcium Alginate with Silver Wound #24 Left,Distal,Anterior Lower  Leg: Hydrofera Blue Wound #25 Left,Distal,Posterior Lower Leg: Hydrofera Blue Wound #3R Right,Posterior Calf: Hydrofera Blue Secondary Dressing: Dry Gauze - secure toes with tape Edema Control: Unna Boots Bilaterally Off-Loading: Open toe surgical shoe to: - left and right foot 1. I changed all the dressings to Hydrofera Blue 2. Still under Unna boots bilaterally 3. We are still using silver alginate to the open areas on his dorsal second toes. There is still exposed bone on the right. The left wound actually looks better. He is not on any antibiotics 4. His wife asks about antibiotics today and I must say I was really not expecting this to take this long. I have asked her to call the surgeon's office and see what timeframe are looking at if there is a continued problem with this I may actually put him on antibiotics Electronic Signature(s) Signed: 12/05/2019 5:48:31 PM By: [name] MD Entered By: [name] on 12/05/2019 09:20:30 -------------------------------------------------------------------------------- SuperBill Details Patient Name: Date of Service: [name] 12/05/2019 Medical Record [id number] Patient Account Number: [id number] Date of Birth/Sex: Treating RN: 05-Apr-1941 (78 y.o. [name]) Dolores Lory, [place] Primary Care Provider: Hurshel Party Other Clinician: Referring Provider: Treating Provider/Extender:Jasiyah Paulding, Cheryl Flash, [employer] Weeks in Treatment: 40 Diagnosis Coding ICD-10 Codes Code Description L97.211 Non-pressure chronic ulcer of right calf limited to breakdown of skin L97.221 Non-pressure chronic ulcer of left calf limited to breakdown of skin I87.333 Chronic venous hypertension (idiopathic) with ulcer and inflammation of bilateral lower extremity E11.51 Type 2 diabetes mellitus with  diabetic peripheral angiopathy without gangrene E11.42 Type 2 diabetes mellitus with diabetic polyneuropathy L97.524 Non-pressure chronic ulcer of other part  of left foot with necrosis of bone L97.511 Non-pressure chronic ulcer of other part of right foot limited to breakdown of skin G28.36 Chronic systolic (congestive) heart failure O29.476 Other chronic osteomyelitis, left ankle and foot Facility Procedures The patient participates with Medicare or their insurance follows the Medicare Facility Guidelines: CPT4 Code Description Modifier Quantity [id number] - APPLY UNNA BOOT/PROFO BILATERAL 1 Physician Procedures CPT4 Code Description: [id number] - WC PHYS LEVEL 3 - EST PT ICD-10 Diagnosis Description L97.211 Non-pressure chronic ulcer of right calf limited to bre L97.221 Non-pressure chronic ulcer of left calf limited to brea L97.524 Non-pressure chronic  ulcer of other part of left foot w L97.511 Non-pressure chronic ulcer of other part of right foot Modifier: akdown of skin kdown of skin ith necrosis of b limited to breakd Quantity: 1 one own of skin Electronic Signature(s) Signed: 12/08/2019 8:54:50 AM By: Darlin Priestly Signed: 12/09/2019 7:03:15 AM By: [name] MD Previous Signature: 12/05/2019 5:48:31 PM Version By: [name] MD Entered By: Darlin Priestly on 12/08/2019 08:54:48

## 2019-12-06 NOTE — Telephone Encounter (Signed)
Pt's wife called regarding paperwork and medical clearance for pt to have sx at hospital. Mrs. Gabin stated pt was supposed to have had sx at [facility] on 11/24/19 but his heart was too weak to do his sx there. Pt's wife is calling to follow up on the status of paperwork for her husband to get his sx.

## 2019-12-07 ENCOUNTER — Encounter: Payer: Self-pay | Admitting: Podiatry

## 2019-12-11 ENCOUNTER — Telehealth (INDEPENDENT_AMBULATORY_CARE_PROVIDER_SITE_OTHER): Payer: Self-pay

## 2019-12-11 ENCOUNTER — Encounter: Payer: Medicare HMO | Admitting: Podiatry

## 2019-12-11 NOTE — Telephone Encounter (Signed)
Opened in error

## 2019-12-12 ENCOUNTER — Encounter (HOSPITAL_BASED_OUTPATIENT_CLINIC_OR_DEPARTMENT_OTHER): Payer: Medicare HMO | Admitting: Internal Medicine

## 2019-12-12 ENCOUNTER — Other Ambulatory Visit: Payer: Self-pay

## 2019-12-12 DIAGNOSIS — L97821 Non-pressure chronic ulcer of other part of left lower leg limited to breakdown of skin: Secondary | ICD-10-CM | POA: Diagnosis not present

## 2019-12-12 DIAGNOSIS — E1151 Type 2 diabetes mellitus with diabetic peripheral angiopathy without gangrene: Secondary | ICD-10-CM | POA: Diagnosis not present

## 2019-12-12 DIAGNOSIS — L97221 Non-pressure chronic ulcer of left calf limited to breakdown of skin: Secondary | ICD-10-CM | POA: Diagnosis not present

## 2019-12-12 DIAGNOSIS — M86672 Other chronic osteomyelitis, left ankle and foot: Secondary | ICD-10-CM | POA: Diagnosis not present

## 2019-12-12 DIAGNOSIS — L97211 Non-pressure chronic ulcer of right calf limited to breakdown of skin: Secondary | ICD-10-CM | POA: Diagnosis not present

## 2019-12-12 DIAGNOSIS — L97511 Non-pressure chronic ulcer of other part of right foot limited to breakdown of skin: Secondary | ICD-10-CM | POA: Diagnosis not present

## 2019-12-12 DIAGNOSIS — I5022 Chronic systolic (congestive) heart failure: Secondary | ICD-10-CM | POA: Diagnosis not present

## 2019-12-12 DIAGNOSIS — I87333 Chronic venous hypertension (idiopathic) with ulcer and inflammation of bilateral lower extremity: Secondary | ICD-10-CM | POA: Diagnosis not present

## 2019-12-12 DIAGNOSIS — E11622 Type 2 diabetes mellitus with other skin ulcer: Secondary | ICD-10-CM | POA: Diagnosis not present

## 2019-12-12 DIAGNOSIS — E1142 Type 2 diabetes mellitus with diabetic polyneuropathy: Secondary | ICD-10-CM | POA: Diagnosis not present

## 2019-12-12 DIAGNOSIS — L97524 Non-pressure chronic ulcer of other part of left foot with necrosis of bone: Secondary | ICD-10-CM | POA: Diagnosis not present

## 2019-12-13 ENCOUNTER — Telehealth: Payer: Self-pay | Admitting: *Deleted

## 2019-12-13 NOTE — Telephone Encounter (Signed)
LM for wife.  OK for pt to hold coumadin starting today for toe (2) amputations on Monday 12/18/19.  CHADS2 = 4

## 2019-12-13 NOTE — Progress Notes (Addendum)
[name], [name] ([id number]) Visit Report for 12/12/2019 Arrival Information Details Patient Name: Date of Service: [name], [name] 12/12/2019 8:00 AM Medical Record [place] Patient Account Number: [id number] Date of Birth/Sex: Treating RN: [birth year] (79 y.o. [name] Primary Care Alven Alverio: Hurshel Party Other Clinician: Referring Ninetta Adelstein: Treating Joellen Tullos/Extender:Robson, Cheryl Flash, [place] Weeks in Treatment: 44 Visit Information History Since Last Visit All ordered tests and consults were completed: Yes Patient Arrived: Wheel Chair Added or deleted any medications: No Arrival Time: 08:26 Any new allergies or adverse reactions: No Accompanied By: spouse Had a fall or experienced change in No Transfer Assistance: None activities of daily living that may affect Patient Identification Verified: Yes risk of falls: Secondary Verification Process Yes Signs or symptoms of abuse/neglect since last No Completed: visito Patient Requires Transmission- No Hospitalized since last visit: No Based Precautions: Implantable device outside of the clinic excluding No Patient Has Alerts: Yes cellular tissue based products placed in the center Patient Alerts: Patient on Blood since last visit: Thinner Has Dressing in Place as Prescribed: Yes Has Compression in Place as Prescribed: Yes Pain Present Now: Yes Electronic Signature(s) Signed: 12/12/2019 5:21:02 PM By: Baruch Gouty RN, BSN Entered By: Baruch Gouty on 12/12/2019 08:27:10 -------------------------------------------------------------------------------- Compression Therapy Details Patient Name: Date of Service: Daniel Pia D. 12/12/2019 8:00 AM Medical Record [id number] Patient Account Number: [id number] Date of Birth/Sex: Treating RN: March 13, 1941 (78 y.o. [name]) [name] Primary Care Rosey Eide: Hurshel Party Other Clinician: Referring Anthonia Monger: Treating Salathiel Ferrara/Extender:Robson,  Cheryl Flash, [place] Weeks in Treatment: 41 Compression Therapy Performed for Wound Wound #19 Left,Anterior Lower Leg Assessment: Performed By: Jake Church, RN Compression Type: Rolena Infante Post Procedure Diagnosis Same as Pre-procedure Electronic Signature(s) Signed: 12/13/2019 8:57:53 AM By: [name] RN Entered By: [name] on 12/12/2019 09:08:14 -------------------------------------------------------------------------------- Compression Therapy Details Patient Name: Date of Service: [name], [name] 12/12/2019 8:00 AM Medical Record [id number] Patient Account Number: [id number] Date of Birth/Sex: Treating RN: [birth year] (78 y.o. [name]) [name] Primary Care Betzabeth Derringer: Hurshel Party Other Clinician: Referring Tarrance Januszewski: Treating Alexya Mcdaris/Extender:Robson, Cheryl Flash, [place] Weeks in Treatment: 41 Compression Therapy Performed for Wound Wound #24 Left,Distal,Anterior Lower Leg Assessment: Performed By: Jake Church, RN Compression Type: Rolena Infante Post Procedure Diagnosis Same as Pre-procedure Electronic Signature(s) Signed: 12/13/2019 8:57:53 AM By: [name] RN Entered By: [name] on 12/12/2019 09:08:14 -------------------------------------------------------------------------------- Compression Therapy Details Patient Name: Date of Service: [name], [name] 12/12/2019 8:00 AM Medical Record [id number] Patient Account Number: [id number] Date of Birth/Sex: Treating RN: 1940/12/13 (78 y.o. [name]) [name] Primary Care Faith Patricelli: Hurshel Party Other Clinician: Referring Abdoulaye Drum: Treating Nancyjo Givhan/Extender:Robson, Cheryl Flash, [place] Weeks in Treatment: 41 Compression Therapy Performed for Wound Wound #25 Left,Distal,Posterior Lower Leg Assessment: Performed By: Jake Church, RN Compression Type: Rolena Infante Post Procedure Diagnosis Same as Pre-procedure Electronic Signature(s) Signed: 12/13/2019 8:57:53  AM By: [name] RN Entered By: [name] on 12/12/2019 09:08:14 -------------------------------------------------------------------------------- Compression Therapy Details Patient Name: Date of Service: [name], [name] 12/12/2019 8:00 AM Medical Record [id number] Patient Account Number: [id number] Date of Birth/Sex: Treating RN: 01-13-1941 (78 y.o. [name]) [name] Primary Care Jamani Eley: Hurshel Party Other Clinician: Referring Preston Weill: Treating Sherree Shankman/Extender:Robson, Cheryl Flash, [place] Weeks in Treatment: 41 Compression Therapy Performed for Wound Wound #3R Right,Posterior Calf Assessment: Performed By: Jake Church, RN Compression Type: Rolena Infante Post Procedure Diagnosis Same as Pre-procedure Electronic Signature(s) Signed: 12/13/2019 8:57:53 AM By: [name] RN Entered By: [name] on 12/12/2019 09:08:14 -------------------------------------------------------------------------------- Encounter Discharge Information Details Patient Name: Date of  Service: [name], [name] 12/12/2019 8:00 AM Medical Record [place] Patient Account Number: [id number] Date of Birth/Sex: Treating RN: Feb 13, 1941 (79 y.o. [name] Primary Care Domitila Stetler: Hurshel Party Other Clinician: Referring Jadarrius Maselli: Treating Rhythm Wigfall/Extender:Robson, Cheryl Flash, [place] Weeks in Treatment: 86 Encounter Discharge Information Items Discharge Condition: Stable Ambulatory Status: Wheelchair Discharge Destination: Home Transportation: Private Auto Accompanied By: wife Schedule Follow-up Appointment: Yes Clinical Summary of Care: Patient Declined Electronic Signature(s) Signed: 12/12/2019 5:07:08 PM By: Kela Millin Entered By: Kela Millin on 12/12/2019 10:10:29 -------------------------------------------------------------------------------- Lower Extremity Assessment Details Patient Name: Date of Service: [name], [name]  12/12/2019 8:00 AM Medical Record [id number] Patient Account Number: [id number] Date of Birth/Sex: Treating RN: 21-Jun-1941 (79 y.o. [name] Primary Care Brinleigh Tew: Hurshel Party Other Clinician: Referring Thaer Miyoshi: Treating Jarry Manon/Extender:Robson, Cheryl Flash, [place] Weeks in Treatment: 87 Edema Assessment Assessed: [Left: No] [Right: No] Edema: [Left: Yes] [Right: Yes] Calf Left: Right: Point of Measurement: 31 cm From Medial Instep 31.4 cm 30.5 cm Ankle Left: Right: Point of Measurement: 11 cm From Medial Instep 20.6 cm 21.5 cm Vascular Assessment Pulses: Dorsalis Pedis Palpable: [Left:No] [Right:No] Electronic Signature(s) Signed: 12/12/2019 5:21:02 PM By: Baruch Gouty RN, BSN Entered By: Baruch Gouty on 12/12/2019 08:39:53 -------------------------------------------------------------------------------- Multi Wound Chart Details Patient Name: Date of Service: Daniel Pia D. 12/12/2019 8:00 AM Medical Record [id number] Patient Account Number: [id number] Date of Birth/Sex: Treating RN: 19-Feb-1941 (79 y.o. 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] [21:Diabetic Wound/Ulcer of the Lower Extremity] Secondary Etiology: [13:N/A] [19:N/A] [21:N/A] Comorbid History: [13:Cataracts, Hypertension, Cataracts, Hypertension, Cataracts, Hypertension,  Peripheral Venous Disease, Peripheral Venous Disease, Peripheral Venous Disease, Type II Diabetes, Gout, Received Radiation] [19:Type II Diabetes, Gout,  Received Radiation] [21:Type II Diabetes, Gout, Received Radiation] Date Acquired: [13:08/22/2019] [19:09/23/2019] [21:09/23/2019] Weeks of Treatment: [13:16] [19:11] [21:11] Wound Status: [13:Open] [19:Open] [21:Open] Wound Recurrence: [13:No] [19:No] [21:No] Clustered Wound: [13:No] [19:No] [21:No] Clustered Quantity: [13:N/A] [19:1] [21:N/A] Measurements L x W x D 1.4x1.4x0.2 [19:0.9x0.7x0.1] [21:1.9x1.5x0.3] (cm) Area (cm) : [13:1.539] [19:0.495] [21:2.238] Volume (cm) : [13:0.308] [19:0.049] [21:0.672] % Reduction in Area: [13:50.00%] [19:99.10%] [21:-239.10%] % Reduction in Volume: 0.00% [19:99.10%] [21:-918.20%] Classification: [13:Grade 2] [19:Full Thickness Without Exposed Support Structures] [21:Grade 2] Exudate Amount: [13:Small] [19:Small] [21:Small] Exudate Type: [13:Serosanguineous] [19:Serosanguineous] [21:Serosanguineous] Exudate Color: [13:red, brown] [19:red, brown] [21:red, brown] Wound Margin: [13:Distinct, outline attached Flat and Intact] [21:Flat and Intact] Granulation Amount: [13:Small (1-33%)] [19:Large (67-100%)] [21:None Present (0%)] Granulation Quality: [13:N/A] [19:Red, Hyper-granulation] [21:N/A] Necrotic Amount: [13:Large (67-100%)] [19:None Present (0%)] [21:Large (67-100%)] Necrotic Tissue: [13:Adherent Slough] [19:N/A] [21:Eschar, Adherent Slough] Exposed Structures: [13:Fat Layer (Subcutaneous Fat Layer (Subcutaneous Fat Layer (Subcutaneous Tissue) Exposed: Yes Joint: Yes Bone: Yes Fascia: No Tendon: No Muscle: No] [19:Tissue) Exposed: Yes Fascia: No Tendon: No Muscle: No Joint: No Bone: No] [21:Tissue) Exposed: Yes  Bone: Yes Fascia: No Tendon: No Muscle: No Joint: No] Epithelialization: [13:None] [19:Small (1-33%)] [21:None] Procedures Performed: N/A [19:Compression Therapy 24] [21:N/A 25] Photos:  [13:No Photos] [21:No Photos] Wound Location: [13:Left Lower Leg - Anterior, Distal] [19:Left Lower Leg - Posterior,Left Lower Leg - Posterior, Distal] [21:Proximal] Wounding Event: [13:Gradually Appeared] [19:Gradually Appeared] [  21:Gradually Appeared] Primary Etiology: [13:Venous Leg Ulcer] [19:Venous Leg Ulcer] [21:Diabetic Wound/Ulcer of the Lower Extremity] Secondary Etiology: [13:Diabetic Wound/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] [19:Lower Extremity] Comorbid History: [13:Cataracts, Hypertension, Cataracts, Hypertension, Cataracts, Hypertension, Peripheral Venous Disease, Peripheral Venous Disease, Peripheral Venous Disease, Type II Diabetes, Gout, Received Radiation] [19:Type II Diabetes, Gout,  Received Radiation] [21:Type II Diabetes, Gout, Received Radiation] Date Acquired: [13:10/04/2019] [19:10/04/2019] [21:10/31/2019] Weeks of Treatment: [13:9] [19:9] [21:6] Wound Status: [13:Open] [19:Open] [21:Healed - Epithelialized] Wound Recurrence: [13:No] [19:No] [21:No] Clustered Wound: [13:Yes] [19:Yes] [21:No] Clustered Quantity: [13:1] [19:1] [21:N/A] Measurements L x W x D 1.3x0.8x0.1 [19:1x1.2x0.1] [21:0x0x0] (cm) Area (cm) : [13:0.817] [19:0.942] [21:0] Volume (cm) : [13:0.082] [19:0.094] [21:0] % Reduction in Area: [13:87.20%] [19:96.20%] [21:100.00%] % Reduction in Volume: 87.10% [19:96.20%] [21:100.00%] Classification: [13:Full Thickness Without Exposed Support Structures Exposed Support Structures] [19:Full Thickness Without] [21:Grade 1] Exudate Amount: [13:Small] [19:Small] [21:None Present] Exudate Type: [13:Serosanguineous] [19:Serosanguineous] [21:N/A] Exudate Color: [13:red, brown] [19:red, brown] [21:N/A] Wound Margin: [13:Flat and Intact] [19:Flat and Intact] [21:Flat and Intact] Granulation Amount: [13:Large (67-100%)] [19:Medium (34-66%)] [21:None Present (0%)] Granulation Quality: [13:Red] [19:Pink] [21:N/A] Necrotic Amount:  [13:None Present (0%)] [19:Medium (34-66%)] [21:None Present (0%)] Necrotic Tissue: [13:N/A] [19:Adherent Slough] [21:N/A] Exposed Structures: [13:Fat Layer (Subcutaneous Fat Layer (Subcutaneous Fascia: No Tissue) Exposed: Yes Fascia: No Tendon: No Muscle: No Joint: No Bone: No] [19:Tissue) Exposed: Yes Fascia: No Tendon: No Muscle: No Joint: No Bone: No] [21:Fat Layer (Subcutaneous Tissue)  Exposed: No Tendon: No Muscle: No Joint: No Bone: No] Epithelialization: [13:Medium (34-66%)] [19:Small (1-33%)] [21:Large (67-100%)] Procedures Performed: Compression Therapy [13:3R] [19:Compression Therapy N/A] [21:N/A N/A] Photos: [13:No Photos] [19:N/A] [21:N/A] Wound Location: [13:Right Calf - Posterior] [19:N/A] [21:N/A] Wounding Event: [13:Gradually Appeared] [19:N/A] [21:N/A] Primary Etiology: [13:Diabetic Wound/Ulcer of the N/A Lower Extremity] [21:N/A] Secondary Etiology: [13:N/A] [19:N/A] [21:N/A] Comorbid History: [13:Cataracts, Hypertension, N/A Peripheral Venous Disease, Type II Diabetes, Gout, Received Radiation] [21:N/A] Date Acquired: [13:02/28/2019] [19:N/A] [21:N/A] Weeks of Treatment: [13:41] [19:N/A] [21:N/A] Wound Status: [13:Open] [19:N/A] [21:N/A] Wound Recurrence: [13:Yes] [19:N/A] [21:N/A] Clustered Wound: [13:No] [19:N/A] [21:N/A] Clustered Quantity: [13:N/A] [19:N/A] [21:N/A] Measurements L x W x D 1.9x1.8x0.1 [19:N/A] [21:N/A] (cm) Area (cm) : [13:2.686] [19:N/A] [21:N/A] Volume (cm) : [13:0.269] [19:N/A] [21:N/A] % Reduction in Area: [13:89.10%] [19:N/A] [21:N/A] % Reduction in Volume: 89.00% [19:N/A] [21:N/A] Classification: [13:Grade 2] [19:N/A] [21:N/A] Exudate Amount: [13:Small] [19:N/A] [21:N/A] Exudate Type: [13:Serosanguineous] [19:N/A] [21:N/A] Exudate Color: [13:red, brown] [19:N/A] [21:N/A] Wound Margin: [13:Distinct, outline attached] [19:N/A] [21:N/A] Granulation Amount: [13:Large (67-100%)] [19:N/A] [21:N/A] Granulation Quality: [13:Red] [19:N/A]  [21:N/A] Necrotic Amount: [13:None Present (0%)] [19:N/A] [21:N/A] Necrotic Tissue: [13:N/A] [19:N/A] [21:N/A] Exposed Structures: [13:Fat Layer (Subcutaneous Tissue) Exposed: Yes Fascia: No Tendon: No Muscle: No Joint: No Bone: No] [19:N/A] [21:N/A] Epithelialization: [13:Small (1-33%) Compression Therapy] [19:N/A N/A] [21:N/A N/A] Treatment Notes Electronic Signature(s) Signed: 12/12/2019 5:29:05 PM By: [name] MD Entered By: [name] on 12/12/2019 09:12:06 -------------------------------------------------------------------------------- Multi-Disciplinary Care Plan Details Patient Name: Date of Service: Daniel Pia D. 12/12/2019 8:00 AM Medical Record [id number] Patient Account Number: [id number] Date of Birth/Sex: Treating RN: 1940/12/11 (78 y.o. [name]) [name] Primary Care Shaquille Janes: Hurshel Party Other Clinician: Referring Rease Swinson: Treating Diogenes Whirley/Extender:Robson, Cheryl Flash, [place] Weeks in Treatment: 88 Active Inactive Wound/Skin Impairment Nursing Diagnoses: Knowledge deficit related to ulceration/compromised skin integrity Goals: Patient/caregiver will verbalize understanding of skin care regimen Date Initiated: 02/28/2019 Target Resolution Date: 12/29/2019 Goal Status: Active Ulcer/skin breakdown will have a volume reduction of 30% by week 4 Date Initiated: 02/28/2019 Date Inactivated: 04/04/2019 Target  Resolution Date: 03/31/2019 Goal Status: Met Ulcer/skin breakdown will have a volume reduction of 50% by week 8 Date Initiated: 04/04/2019 Date Inactivated: 05/09/2019 Target Resolution Date: 05/05/2019 Goal Status: Met Ulcer/skin breakdown will have a volume reduction of 80% by week 12 Date Initiated: 05/09/2019 Date Inactivated: 06/13/2019 Target Resolution Date: 06/09/2019 Unmet Goal Status: Unmet Reason: comorbities/new wounds Ulcer/skin breakdown will heal within 14 weeks Date Initiated: 06/13/2019 Date Inactivated: 07/11/2019 Target  Resolution Date: 07/07/2019 Unmet Goal Status: Unmet Reason: comorbityies Interventions: Assess patient/caregiver ability to obtain necessary supplies Assess patient/caregiver ability to perform ulcer/skin care regimen upon admission and as needed Assess ulceration(s) every visit Notes: Electronic Signature(s) Signed: 12/13/2019 8:57:53 AM By: [name] RN Entered By: [name] on 12/12/2019 09:02:15 -------------------------------------------------------------------------------- Pain Assessment Details Patient Name: Date of Service: [name], [name] 12/12/2019 8:00 AM Medical Record [id number] Patient Account Number: [id number] Date of Birth/Sex: Treating RN: 01/14/1941 (79 y.o. [name] Primary Care Lakeithia Rasor: Hurshel Party Other Clinician: Referring Cameo Shewell: Treating Khushboo Chuck/Extender:Robson, Cheryl Flash, [place] Weeks in Treatment: 31 Active Problems Location of Pain Severity and Description of Pain Patient Has Paino Yes Site Locations Pain Location: Pain in Ulcers With Dressing Change: Yes Rate the pain. Current Pain Level: 0 Worst Pain Level: 5 Least Pain Level: 0 Character of Pain Describe the Pain: Aching, Burning Pain Management and Medication Current Pain Management: Medication: Yes Is the Current Pain Management Adequate: Adequate How does your wound impact your activities of daily livingo Sleep: No Bathing: No Appetite: No Relationship With Others: No Bladder Continence: No Emotions: No Bowel Continence: No Work: No Toileting: No Drive: No Dressing: No Hobbies: No Electronic Signature(s) Signed: 12/12/2019 5:21:02 PM By: Baruch Gouty RN, BSN Entered By: Baruch Gouty on 12/12/2019 47:42:59 -------------------------------------------------------------------------------- Patient/Caregiver Education Details Patient Name: Date of Service: [name], Daniel D. 3/16/2021andnbsp8:00 AM Medical Record [id number] Patient  Account Number: [id number] Date of Birth/Gender: Treating RN: September 23, 1941 (78 y.o. [name] Primary Care Physician: Hurshel Party Other Clinician: Referring Physician: Treating Physician/Extender:Robson, Cheryl Flash, [facility] Weeks in Treatment: 21 Education Assessment Education Provided To: Patient Education Topics Provided Wound/Skin Impairment: Methods: Explain/Verbal Responses: State content correctly Electronic Signature(s) Signed: 12/13/2019 8:57:53 AM By: [name] RN Entered By: [name] on 12/12/2019 09:02:35 -------------------------------------------------------------------------------- Wound Assessment Details Patient Name: Date of Service: [name], [name] 12/12/2019 8:00 AM Medical Record [id number] Patient Account Number: [id number] Date of Birth/Sex: Treating RN: 1940-12-02 (79 y.o. [name] Primary Care Shawntelle Ungar: Hurshel Party Other Clinician: Referring Khaniyah Bezek: Treating Jobin Montelongo/Extender:Robson, Cheryl Flash, [place] Weeks in Treatment: 48 Wound Status Wound Number: 13 Primary Diabetic Wound/Ulcer of the Lower Extremity Etiology: Wound Location: Left Toe Second Wound Open Wounding Event: Gradually Appeared Status: Date Acquired: 08/22/2019 Comorbid Cataracts, Hypertension, Peripheral Venous Weeks Of Treatment: 16 History: Disease, Type II Diabetes, Gout, Received Clustered Wound: No Radiation Photos Photo Uploaded By: Mikeal Hawthorne on 12/13/2019 13:45:04 Wound Measurements Length: (cm) 1.4 Width: (cm) 1.4 Depth: (cm) 0.2 Area: (cm) 1.539 Volume: (cm) 0.308 Wound Description Classification: Grade 2 Wound Margin: Distinct, outline attached Exudate Amount: Small Exudate Type: Serosanguineous Exudate Color: red, brown Wound Bed Granulation Amount: Small (1-33%) Necrotic Amount: Large (67-100%) Necrotic Quality: Adherent Slough fter Cleansing: No ino Yes Exposed Structure sed: No Subcutaneous Tissue)  Exposed: Yes sed: No sed: No ed: Yes d: Yes % Reduction in Area: 50% % Reduction in Volume: 0% Epithelialization: None Tunneling: No Undermining: No Foul Odor A Slough/Fibr Fascia Expo Fat Layer ( Tendon Expo Muscle Expo Joint Expos Bone Expose Treatment Notes Wound #  13 (Left Toe Second) 1. Cleanse With Wound Cleanser 3. Primary Dressing Applied Calcium Alginate Ag 4. Secondary Dressing Dry Gauze Notes netting Electronic Signature(s) Signed: 12/12/2019 5:21:02 PM By: Baruch Gouty RN, BSN Entered By: Baruch Gouty on 12/12/2019 08:50:41 -------------------------------------------------------------------------------- Wound Assessment Details Patient Name: Date of Service: Daniel Pia D. 12/12/2019 8:00 AM Medical Record [id number] Patient Account Number: [id number] Date of Birth/Sex: Treating RN: 1941-09-26 (79 y.o. [name] Primary Care Laurali Goddard: Hurshel Party Other Clinician: Referring Alonnie Bieker: Treating Corban Kistler/Extender:Robson, Cheryl Flash, [place] Weeks in Treatment: 91 Wound Status Wound Number: 19 Primary Venous Leg Ulcer Etiology: Wound Location: Left Lower Leg - Anterior Wound Open Wounding Event: Gradually Appeared Status: Date Acquired: 09/23/2019 Comorbid Cataracts, Hypertension, Peripheral Venous Weeks Of Treatment: 11 History: Disease, Type II Diabetes, Gout, Received Clustered Wound: No Radiation Photos Photo Uploaded By: Mikeal Hawthorne on 12/13/2019 13:45:05 Wound Measurements Length: (cm) 0.9 % Reduct Width: (cm) 0.7 % Reduct Depth: (cm) 0.1 Epitheli Clustered Quantity: 1 Tunnelin Area: (cm) 0.495 Undermi Volume: (cm) 0.049 Wound Description Classification: Full Thickness Without Exposed Support Foul Odo Structures Slough/F Wound Flat and Intact Margin: Exudate Small Amount: Exudate Serosanguineous Type: Exudate red, brown Color: Wound Bed Granulation Amount: Large (67-100%) Granulation Quality:  Red, Hyper-granulation Fascia Exp Necrotic Amount: None Present (0%) Fat Layer Tendon Exp Muscle Exp Joint Expo Bone Expos r After Cleansing: No ibrino No Exposed Structure osed: No (Subcutaneous Tissue) Exposed: Yes osed: No osed: No sed: No ed: No ion in Area: 99.1% ion in Volume: 99.1% alization: Small (1-33%) g: No ning: No Treatment Notes Wound #19 (Left, Anterior Lower Leg) 1. Cleanse With Wound Cleanser Soap and water 2. Periwound Care TCA Cream 3. Primary Dressing Applied Hydrofera Blue 4. Secondary Dressing ABD Pad Dry Gauze 6. Support Layer Warden/ranger) Signed: 12/12/2019 5:21:02 PM By: Baruch Gouty RN, BSN Entered By: Baruch Gouty on 12/12/2019 08:51:09 -------------------------------------------------------------------------------- Wound Assessment Details Patient Name: Date of Service: [name], [name] 12/12/2019 8:00 AM Medical Record [id number] Patient Account Number: [id number] Date of Birth/Sex: Treating RN: [birth year] (79 y.o. [name] Primary Care Dakota Vanwart: Hurshel Party Other Clinician: Referring Tige Meas: Treating Terressa Evola/Extender:Robson, Cheryl Flash, [place] Weeks in Treatment: 40 Wound Status Wound Number: 21 Primary Diabetic Wound/Ulcer of the Lower Extremity Etiology: Wound Location: Right Toe Second Wound Open Wounding Event: Gradually Appeared Status: Date Acquired: 09/23/2019 Comorbid Cataracts, Hypertension, Peripheral Venous Weeks Of Treatment: 11 History: Disease, Type II Diabetes, Gout, Received Clustered Wound: No Radiation Photos Photo Uploaded By: Mikeal Hawthorne on 12/13/2019 13:45:52 Wound Measurements Length: (cm) 1.9 % Reduction Width: (cm) 1.5 % Reduction Depth: (cm) 0.3 Epitheliali Area: (cm) 2.238 Tunneling: Volume: (cm) 0.672 Underminin Wound Description Classification: Grade 2 Wound Margin: Flat and Intact Exudate Amount:  Small Exudate Type: Serosanguineous Exudate Color: red, brown Wound Bed Granulation Amount: None Present (0%) Necrotic Amount: Large (67-100%) Necrotic Quality: Eschar, Adherent Slough Foul Odor After Cleansing: No Slough/Fibrino Yes Exposed Structure Fascia Exposed: No Fat Layer (Subcutaneous Tissue) Exposed: Yes Tendon Exposed: No Muscle Exposed: No Joint Exposed: No Bone Exposed: Yes in Area: -239.1% in Volume: -918.2% zation: None No g: No Treatment Notes Wound #21 (Right Toe Second) 1. Cleanse With Wound Cleanser 3. Primary Dressing Applied Calcium Alginate Ag 4. Secondary Dressing Dry Gauze Notes netting Electronic Signature(s) Signed: 12/12/2019 5:21:02 PM By: Baruch Gouty RN, BSN Entered By: Baruch Gouty on 12/12/2019 08:51:23 -------------------------------------------------------------------------------- Wound Assessment Details Patient Name: Date of Service: Daniel Pia D. 12/12/2019 8:00 AM Medical Record [id number] Patient  Account Number: [id number] Date of Birth/Sex: Treating RN: 09-08-1941 (79 y.o. M) Primary Care Merced Hanners: Hurshel Party Other Clinician: Referring Viraat Vanpatten: Treating Savannha Welle/Extender:Robson, Cheryl Flash, [place] Weeks in Treatment: 57 Wound Status Wound Number: 24 Primary Venous Leg Ulcer Etiology: Wound Location: Left Lower Leg - Anterior, Distal Secondary Diabetic Wound/Ulcer of the Lower Extremity Wounding Event: Gradually Appeared Etiology: Date Acquired: 10/04/2019 Wound Open Weeks Of Treatment: 9 Status: Clustered Wound: Yes Comorbid Cataracts, Hypertension, Peripheral Venous History: Disease, Type II Diabetes, Gout, Received Radiation Photos Wound Measurements Length: (cm) 1.3 % Reduct Width: (cm) 0.8 % Reduct Depth: (cm) 0.1 Epitheli Clustered Quantity: 1 Tunnelin Area: (cm) 0.817 Undermi Volume: (cm) 0.082 Wound Description Classification: Full Thickness Without Exposed Support Foul  Odo Structures Slough/F Wound Flat and Intact Margin: Exudate Small Amount: Exudate Serosanguineous Type: Exudate red, brown Color: Wound Bed Granulation Amount: Large (67-100%) Granulation Quality: Red Fascia E Necrotic Amount: None Present (0%) Fat Laye Tendon E Muscle E Joint Ex Bone Exp r After Cleansing: No ibrino Yes Exposed Structure xposed: No r (Subcutaneous Tissue) Exposed: Yes xposed: No xposed: No posed: No osed: No ion in Area: 87.2% ion in Volume: 87.1% alization: Medium (34-66%) g: No ning: No Treatment Notes Wound #24 (Left, Distal, Anterior Lower Leg) 1. Cleanse With Wound Cleanser Soap and water 2. Periwound Care TCA Cream 3. Primary Dressing Applied Hydrofera Blue 4. Secondary Dressing ABD Pad Dry Gauze 6. Support Layer Warden/ranger) Signed: 12/13/2019 4:08:06 PM By: Mikeal Hawthorne EMT/HBOT Previous Signature: 12/12/2019 5:21:02 PM Version By: Baruch Gouty RN, BSN Entered By: Mikeal Hawthorne on 12/13/2019 13:38:05 -------------------------------------------------------------------------------- Wound Assessment Details Patient Name: Date of Service: Daniel Pia D. 12/12/2019 8:00 AM Medical Record [id number] Patient Account Number: [id number] Date of Birth/Sex: Treating RN: 1941/01/21 (79 y.o. M) Primary Care Tondalaya Perren: Hurshel Party Other Clinician: Referring Vishal Sandlin: Treating Tiaunna Buford/Extender:Robson, Cheryl Flash, [place] Weeks in Treatment: 67 Wound Status Wound Number: 25 Primary Venous Leg Ulcer Etiology: Wound Location: Left Lower Leg - Posterior, Distal Secondary Diabetic Wound/Ulcer of the Lower Extremity Wounding Event: Gradually Appeared Etiology: Date Acquired: 10/04/2019 Wound Open Weeks Of Treatment: 9 Status: Clustered Wound: Yes Comorbid Cataracts, Hypertension, Peripheral Venous History: Disease, Type II Diabetes, Gout, Received Radiation Photos Wound  Measurements Length: (cm) 1 % Reduct Width: (cm) 1.2 % Reduct Depth: (cm) 0.1 Epitheli Clustered Quantity: 1 Tunnelin Area: (cm) 0.942 Undermi Volume: (cm) 0.094 Wound Description Classification: Full Thickness Without Exposed Support Foul Odo Structures Slough/F Wound Flat and Intact Margin: Exudate Small Amount: Exudate Serosanguineous Type: Exudate red, brown Color: Wound Bed Granulation Amount: Medium (34-66%) Granulation Quality: Pink Fascia E Necrotic Amount: Medium (34-66%) Fat Laye Necrotic Quality: Adherent Slough Tendon E Muscle E Joint Ex Bone Exp r After Cleansing: No ibrino No Exposed Structure xposed: No r (Subcutaneous Tissue) Exposed: Yes xposed: No xposed: No posed: No osed: No ion in Area: 96.2% ion in Volume: 96.2% alization: Small (1-33%) g: No ning: No Treatment Notes Wound #25 (Left, Distal, Posterior Lower Leg) 1. Cleanse With Wound Cleanser Soap and water 2. Periwound Care TCA Cream 3. Primary Dressing Applied Hydrofera Blue 4. Secondary Dressing ABD Pad Dry Gauze 6. Support Layer Warden/ranger) Signed: 12/13/2019 4:08:06 PM By: Mikeal Hawthorne EMT/HBOT Previous Signature: 12/12/2019 5:21:02 PM Version By: Baruch Gouty RN, BSN Entered By: Mikeal Hawthorne on 12/13/2019 13:35:51 -------------------------------------------------------------------------------- Wound Assessment Details Patient Name: Date of Service: Daniel Pia D. 12/12/2019 8:00 AM Medical Record [id number] Patient Account Number: [id number] Date of  Birth/Sex: Treating RN: Oct 28, 1940 (79 y.o. M) Primary Care [place] Coldren: Hurshel Party Other Clinician: Referring Mateja Dier: Treating Dontavia Brand/Extender:Robson, Cheryl Flash, [place] Weeks in Treatment: 41 Wound Status Wound Number: 28 Primary Diabetic Wound/Ulcer of the Lower Extremity Etiology: Wound Location: Left Lower Leg - Posterior, Proximal Secondary  Venous Leg Ulcer Etiology: Wounding Event: Gradually Appeared Wound Healed - Epithelialized Date Acquired: 10/31/2019 Status: Weeks Of Treatment: 6 Comorbid Cataracts, Hypertension, Peripheral Venous Clustered Wound: No History: Disease, Type II Diabetes, Gout, Received Radiation Photos Wound Measurements Length: (cm) 0 % Reductio Width: (cm) 0 % Reductio Depth: (cm) 0 Epithelial Area: (cm) 0 Tunneling Volume: (cm) 0 Undermini Wound Description Classification: Grade 1 Wound Margin: Flat and Intact Exudate Amount: None Present Wound Bed Granulation Amount: None Present (0%) Necrotic Amount: None Present (0%) Foul Odor After Cleansing: No Slough/Fibrino No Exposed Structure Fascia Exposed: No Fat Layer (Subcutaneous Tissue) Exposed: No Tendon Exposed: No Muscle Exposed: No Joint Exposed: No Bone Exposed: No n in Area: 100% n in Volume: 100% ization: Large (67-100%) : No ng: No Electronic Signature(s) Signed: 12/13/2019 4:08:06 PM By: Mikeal Hawthorne EMT/HBOT Previous Signature: 12/12/2019 5:21:02 PM Version By: Baruch Gouty RN, BSN Entered By: Mikeal Hawthorne on 12/13/2019 13:36:47 -------------------------------------------------------------------------------- Wound Assessment Details Patient Name: Date of Service: Daniel Pia D. 12/12/2019 8:00 AM Medical Record [id number] Patient Account Number: [id number] Date of Birth/Sex: Treating RN: 17-Jun-1941 (79 y.o. [name] Primary Care Suzzette Gasparro: [place] Party Other Clinician: Referring Suzanna Zahn: Treating Dontavis Tschantz/Extender:Robson, Cheryl Flash, [place] Weeks in Treatment: 40 Wound Status Wound Number: [id number] Primary Diabetic Wound/Ulcer of the Lower Extremity Etiology: Wound Location: Right Calf - Posterior Wound Open Wounding Event: Gradually Appeared Status: Date Acquired: 02/28/2019 Comorbid Cataracts, Hypertension, Peripheral Venous Weeks Of Treatment: 41 History: Disease, Type II Diabetes,  Gout, Received Clustered Wound: No Radiation Photos Photo Uploaded By: Mikeal Hawthorne on 12/13/2019 13:45:53 Wound Measurements Length: (cm) 1.9 % Reduction Width: (cm) 1.8 % Reduction Depth: (cm) 0.1 Epithelializ Area: (cm) 2.686 Tunneling: Volume: (cm) 0.269 Undermining Wound Description Classification: Grade 2 Wound Margin: Distinct, outline attached Exudate Amount: Small Exudate Type: Serosanguineous Exudate Color: red, brown Wound Bed Granulation Amount: Large (67-100%) Granulation Quality: Red Necrotic Amount: None Present (0%) Foul Odor After Cleansing: No Slough/Fibrino No Exposed Structure Fascia Exposed: No Fat Layer (Subcutaneous Tissue) Exposed: Yes Tendon Exposed: No Muscle Exposed: No Joint Exposed: No Bone Exposed: No in Area: 89.1% in Volume: 89% ation: Small (1-33%) No : No Treatment Notes Wound #3R (Right, Posterior Calf) 1. Cleanse With Wound Cleanser Soap and water 2. Periwound Care TCA Cream 3. Primary Dressing Applied Hydrofera Blue 4. Secondary Dressing ABD Pad Dry Gauze 6. Support Layer Warden/ranger) Signed: 12/12/2019 5:21:02 PM By: Baruch Gouty RN, BSN Entered By: Baruch Gouty on 12/12/2019 08:52:36 -------------------------------------------------------------------------------- Vitals Details Patient Name: Date of Service: Daniel Pia D. 12/12/2019 8:00 AM Medical Record [id number] Patient Account Number: [id number] Date of Birth/Sex: Treating RN: 1940-10-17 (79 y.o. [name] Primary Care Dyneshia Baccam: [place] Party Other Clinician: Referring Otis Portal: Treating Aishani Kalis/Extender:Robson, Cheryl Flash, [place] Weeks in Treatment: 9 Vital Signs Time Taken: 08:27 Temperature (F): 97.8 Height (in): 74 Pulse (bpm): 97 Source: Stated Respiratory Rate (breaths/min): 20 Weight (lbs): 212 Blood Pressure (mmHg): 120/83 Source: Stated Reference Range: 80 -  120 mg / dl Body Mass Index (BMI): 27.2 Electronic Signature(s) Signed: 12/12/2019 5:21:02 PM By: Baruch Gouty RN, BSN Entered By: Baruch Gouty on 12/12/2019 08:27:48

## 2019-12-13 NOTE — Telephone Encounter (Signed)
-----   Message from Massie Maroon, [place] sent at 12/13/2019  4:01 PM EDT ----- Pt having surgery removal of 2 toes on Monday 3/22 said he told to stop coumadin today and wanted to make sure this was correct  Phone # [phone]

## 2019-12-13 NOTE — Progress Notes (Signed)
RYDAN, GULYAS ([id number]) Visit Report for 12/12/2019 HPI Details Patient Name: Date of Service: [name], [name] 12/12/2019 8:00 AM Medical Record [place] Patient Account Number: [id number] Date of Birth/Sex: Treating RN: [birth year] (79 y.o. M) Primary Care Provider: Hurshel Gross Other Clinician: Referring Provider: Treating Provider/Extender:[name], [name], [name] in Treatment: 65 History of Present Illness Location: Patient presents with a wound to left lower leg. Quality: Patient reports No Pain. Duration: 2 months HPI Description: no cig or alcohol. spontaneous appearance in area of stasis dermamtitis. [name] on metformin only. chronic afib on Coumadin. diabetes and coag studies not good. hba1c 7.5. ivr 4.5. no pain or sxs of systemic disease. hx chf. no intermittent claudication 02/28/2019 Readmission This is a now a 79 year old man who was previously cared for in 2016 by Dr. Lindon Gross for wounds on his lower extremities. At that point he had venous reflux studies although I cannot seem to open these in [place] link. He had arterial studies showing an ABI of 1.11 on the right and 1.27 on the left his waveforms were triphasic bilaterally. He was discharged in stockings although I do not believe he is wearing these in some time. He tells me that about a month ago he noted openings of a large wound on the posterior right calf and 2 smaller areas on the left lateral calf and a small area more recently on the left posterior calf. He has been dressing these with peroxide and triple antibiotic ointment. He is not wearing compression. Past medical history; type 2 diabetes with peripheral neuropathy, chronic venous insufficiency, hypertension, cardiomyopathy, chronic atrial fibrillation on Coumadin, prostate cancer, hyperlipidemia, gout, ABI in our clinic was 1.34 on the left and not obtainable on the right 6/9; this is a patient who has chronic venous  insufficiency. He has a fairly substantial area on the right posterior calf, left lateral calf and a small area on the left posterior calf. On arrival last week he had very palpable popliteal and femoral pulses but nothing in his bilateral feet. Unfortunately we cannot get arterial studies until July 1 at [name] office. They live in [place]. We use silver alginate under Kerlix Coban 6/16; patient with chronic venous insufficiency with wounds on his bilateral lower extremities. When he came into our clinic he was discovered to have a complete absence of peripheral pedal pulses at either the dorsalis pedis or posterior tibial. He does have easily palpable femoral and popliteal pulses. He sees [name] tomorrow for noninvasive arterial tests. He may also require venous reflux evaluation although I do not view this as an urgent thing. We have been using silver alginate. His wound surfaces of cleaned up quite nicely 6/23; patient with chronic venous insufficiency with wounds on his bilateral lower extremities. His wounds all are somewhat better looking. He did go to [name] office but somehow ended up on the doctors schedule rather than being scheduled for noninvasive tests therefore his noninvasive tests are scheduled for July 1. We agree that he has venous insufficiency ulcers but I cannot feel any pulses in his lower extremities dictating the need for test. We are only using Kerlix and light Coban unfortunately this appears to be holding the edema 6/30; has his arterial studies tomorrow. We have been using Kerlix and light Coban will go to a more aggressive compression if the arterial studies will allow. We all agreed these are venous wounds however I cannot feel pulses at either the dorsalis pedis or posterior tibial bilaterally. His  wounds generally look some better including left lateral and right posterior. 7/7-Patient returns at 1 week in Kerlix/Coban to both legs, with improvement,  in the left lateral and right posterior lower leg wounds, ABI's are normal in both legs per vascular studies, TBI is also normal on both sides, we are using hydrofera blue to the wounds 7/14; patient's arterial studies from 2 Gross ago showed an ABI on the right at 1.03 with a TBI of 0.86. On the left the ABI was 1.06 with a TBI of 0.84. Notable for the fact that his arterial waveforms were monophasic in all of the lower extremity arteries suggesting some degree of arterial occlusive disease but in general this was felt to be fairly adequate for healing. His compression was increased from 2-3 layer which is appropriate. Dressing was changed to [facility] 7/21; patient's wounds are measuring smaller. The more substantial one on the right posterior calf, second 1 on the left lateral calf. Using [facility] on both wound areas 7/28; patient continues to make nice improvements. The area on the right posterior calf is smaller. Area on the left lateral calf also is smaller. We have been using Hydrofera Blue under compression. The patient will need compression stockings and we have measured him for these in the eventuality that these heal which really should not be too long from now 8/4-Patient continues to make improvement, the right posterior calf area smaller with rim of keratotic skin on one side, the left wound is definitely smaller and improving. 8/11-Returns at 1 week, after being in 3 layer compression on both legs, both wounds appear to be improving, making good progress, patient is happy, pain is also less especially in the right leg wound 8/18; the area on the left anterior lower leg is healed. On the right posterior leg the wound remains although the dimensions are a lot better. 8/25; he arrives in clinic today with a large body of open wound on the left lateral calf. All of the 3 wounds in this area are in close juxtaposition to each other. The story is that we discharged him last  week with no a wrap on the left leg. They went to [place] could not get in as they are only excepting phone orders or online orders for stockings hence they did not put any stocking on the left leg all week. They have something at home but the patient with that was either incapable or just did not put them on. Apparently these opened 1 morning after getting out of bed. The area on the right has no real change 9/1; patient has bilateral lower extremity wounds in the setting of severe chronic venous insufficiency and secondary lymphedema. He arrived last week with new areas on the left lateral lower leg after we did not wrap him and he did not use his stockings. Nevertheless the areas on the left look better today under compression. Posterior right calf does not really changed. We are using Hydrofera Blue on both areas under compression 9/15; bilateral lower extremity wounds in the setting of severe chronic venous insufficiency and secondary lymphedema. He has 20 to 30 mmHg below-knee compression stockings under the eventuality that these close over. We did get the left leg to close but he did not transition to a stocking and this reopened. There are 2 open areas on the left posterior lateral calf and one on the right. Both of these look satisfactory. Using [facility] 9/22; bilateral lower extremity wounds in the setting of  chronic venous insufficiency. 2 superficial areas on the left lateral calf. One on the right just above the Achilles area. We have good edema control we have been using Hydrofera Blue 9/29; the areas on the left lateral calf are healed. On the right just above the Achilles and tendon area things look a lot better small wounds one scabbed area. We have been using Hydrofera Blue. We can discharge him in his own stocking on the left still wrapping on the right. This is the second time we have healed the left leg but he did not put a stocking on last time. Hopefully this will  maintain the edema from chronic venous disease with secondary lymphedema 10/6; he comes in today having a stocking on the left leg. They had trouble getting it on there is a lot of increase in swelling 2 small open areas one anteriorly and one on the medial calf. They report a lot of difficulty getting the stocking on. Paradoxically the area on the right that we have been wrapping posteriorly is closed 10/13; he comes in today with wounds bilaterally including superficial areas on the left medial and left lateral calf. As well as the right posterior has reopened in the Achilles area superiorly. He still does not have his juxta lite stockings although truthfully we would not of been able to use them today anyway. Apparently have been ordered and paid for from [employer] although they have not been delivered 10/20; his area on the right is just the boat closed on the right posterior. Still has the area on the left lateral and a very tiny area on the left medial. He has his bilateral juxta lites although he is not ready for them this week. He tolerated the increase to 4 layer compression last week quite well 10/27; the area on the right posterior calf is once again closed. He has a superficial area on the left lateral calf that is still open. He has been using Hydrofera Blue and bilateral 4-layer compression. He can change to his own juxta lite stocking on the right and we are instructing him today 11/3; the area on the right posterior calf reopened according to the patient and his wife after they took off the stocking when they got home last week. Apparently scabbed over there is now a fairly substantial wound which looks pretty much the same. Our intake nurse noted that they were using the juxta lite stockings appropriately. I was really hoping I might be able to close him out today. He has 1 very tiny remaining area on the left lateral lower leg. 11/10; right posterior calf wound measures smaller but  is still open. We have been using Hydrofera Blue. On the left he has a small oval-shaped wound and he seems to have had another wound distally that is open and likely a blister. We are using Hydrofera Blue under compression 11/17; right posterior calf wound continues to get better. We have been using Hydrofera Blue. On the left lateral one of the wounds has closed still a small open area. We have been using Hydrofera Blue on this as well. Both areas have been under 4-layer compression Arrives in clinic today with some swelling in the dorsal foot on the right some erythema of his forefoot and toes. Initially when I looked at this I almost thought this was a sunburn distal to a wrap injury. 12/1; right posterior calf wound debrided with a curette. We have been using Hydrofera Blue on the left anterior  lateral he has an area across the mid tibia. Finally a small area on the left lateral lower calf. Finally he continues to have de-epithelialized areas on the dorsal aspect of his toes. Initially thought this might be a burn injury when I saw him 2 Gross ago. I now wonder about tinea. I have also reviewed his arterial studies which were really quite good in July/20 with normal TBI's and ABIs but monophasic waveforms 12/8; comes in today with worsening problems especially on the left leg where he now has a cluster of wounds in the left anterior mid tibia. Very poor edema control. I reduced him to 3 layer from 4 layer compression last week because of some concern about blood flow to his toes however he does not have good edema control on the left leg. Right leg edema control looks satisfactory. On the left he has a cluster of wounds anteriorly, small area on the left medial fifth met head and then the collection of areas on his toes which appear better On the right he has the original area on the right posterior calf, a new area right medially. His formal arterial studies from mid July are noted below.  He was evaluated by [facility] ABI Findings: +---------+------------------+-----+----------+--------+ Right Rt Pressure (mmHg)IndexWaveform Comment  +---------+------------------+-----+----------+--------+ Brachial 176     +---------+------------------+-----+----------+--------+ ATA 176 0.99 monophasic  +---------+------------------+-----+----------+--------+ PTA 183 1.03 monophasic  +---------+------------------+-----+----------+--------+ PERO 172 0.97 monophasic  +---------+------------------+-----+----------+--------+ Great Toe153 0.86    +---------+------------------+-----+----------+--------+ +---------+------------------+-----+-----------+-------+ Left Lt Pressure (mmHg)IndexWaveform Comment +---------+------------------+-----+-----------+-------+ Brachial 178     +---------+------------------+-----+-----------+-------+ ATA 162 0.91 multiphasic  +---------+------------------+-----+-----------+-------+ PTA 188 1.06 multiphasic  +---------+------------------+-----+-----------+-------+ PERO 158 0.89 monophasic   +---------+------------------+-----+-----------+-------+ Great Toe150 0.84    +---------+------------------+-----+-----------+-------+ +-------+-----------+-----------+------------+------------+ ABI/TBIToday's ABIToday's TBIPrevious ABIPrevious TBI +-------+-----------+-----------+------------+------------+ Right 1.03 0.86 1.11   +-------+-----------+-----------+------------+------------+ Left 1.06 0.84 1.27   +-------+-----------+-----------+------------+------------+ Tibial waveforms somewhat difficult to record due to irregular heartbeat. Bilateral ABIs appear essentially unchanged compared to prior study on 06/21/15. Summary: Right: Resting right ankle-brachial index is within normal range. No evidence of significant right lower extremity arterial disease. The right  toe-brachial index is normal. Although ankle brachial indices are within normal limits (0.95-1.29), arterial Doppler waveforms at the ankle suggest some component of arterial occlusive disease. Left: Resting left ankle-brachial index is within normal range. No evidence of significant left lower extremity arterial disease. The left toe-brachial index is normal. Although ankle brachial indices are within normal limits (0.95-1.29), arterial Doppler waveforms at the ankle suggest some component of arterial occlusive disease. 12/15; the patient's area on the left mid tibia looks better. Right posterior calf also better. He has the area on the left foot as well. All of his toes look better I think this was tinea. We are using Hydrofera Blue everywhere else The patient was in urgent care yesterday with wheezing and shortness of breath. He got azithromycin and prednisone. He feels better. His lungs are currently clear to auscultation. He was not tested for Covid 19 12/29; the patient arrives in clinic today with quite a bit change. 2 Gross ago he only had areas on the left mid tibia right posterior calf with tinea pedis resolving between his toes. He arrives in clinic today with several areas on the dorsal toes on the right, dorsal left second toe. He has skin breakdown in the left medial calf probably from excess edema. Small area proximally in the medial calf. He has weeping edema fluid coming out of the skin excoriations on the left medial calf. He tells me that  he is having a cardiac catheterization next week. I had a quick look at [facility] health link. He was Gross to have an ejection fraction of 25% during the work-up for persistent atrial fibrillation. He saw his cardiology office yesterday seen by the nurse practitioner. She increased his carvedilol. He has not been on diuretics for apparently several months and indeed in the nurse practitioner Dietrich Pates notes she had knowledge of this. 10/04/2019  on evaluation today patient presents as a walk-in visit concerning the fact that he did not have an appointment here for our clinic at this point. He actually had a cardiac catheterization earlier today and then came from there to here in order to be evaluated. With that being said unfortunately he is having significant cellulitis of his left lower extremity upon evaluation today this appears to have deteriorated even since last week's evaluation with Dr. Dellia Nims. The right lower extremity is actually doing okay I really see no evidence of deterioration at this point at those locations. In fact the right leg seems in general be doing quite well. Nonetheless I am concerned about infection and cellulitis of the left lower extremity and again considering his weakened heart I do not want him to develop into sepsis at all. He is also having some trouble breathing today and I understand according to nursing staff this is always the case to some degree. With that being said the patient unfortunately seems to be in my opinion a little bit worse even his wife feels like that may be the case today. Unsure exactly what is leading to this. Cardiac catheterization I did review the report which showed an ejection fraction of 25% he also had an LAD blockage of around 25% based on what I saw. With that being said there was no significant blockages that required stenting at this point he does have weakened cardiac muscles compared to normal. 10/05/2019; patient was seen yesterday in clinic. He was sent to the ER because of cellulitis of the left leg possibility. In the ER he was given 1 dose of IV Levaquin and discharged on Keflex. He came in the clinic initially for a nurse visit to rewrap his left leg. We did not look at the right leg today that is an Haematologist. The patient also had a cardiac cath. According to him there were no blockages but a very low ejection fraction. 1/12; back for an early follow-up. The  condition of the left lower leg is a lot better although there are multiple open areas. All of them with not a particularly viable surface. On the right posterior calf he has a single wound with a clean surface. He has a wound with exposed bone on the PIP of the left second toe dorsally. He has wounds on the dorsal right first second and third toes. His arterial studies were normal. 1/19; the patient has 3 open wounds on the left leg anteriorly in the mid tibia, distally and medially just above the ankle and posteriorly. On the right he has a small area dime sized on the right posterior calf. His edema control is a lot better. He still has wounds on the right second and left second toes. The left second toe has exposed bone. X- ray I did last week did not show evidence of osteomyelitis in the left foot. 1/26; patient with a multiplicity of wounds and problems. On the left he has a circular area on the left anterior mid tibia Left just above the medical  ankle -left 2nd toe pip -right 2nd toe right posterior calf 2/2; patient with a multiplicity of wounds and problems. He has severe chronic venous insufficiency and the wounds on his leg are all on the left left anterior left medial at the medial malleolus and left posterior. We have been using Iodoflex to these areas to help with ongoing debridement. He also has largely traumatic wounds on his toes this includes the left second with exposed bone. Bone culture I did last week showed Staph aureus which is methicillin sensitive. I discussed with him today the idea of an amputation of this toe because it is literally nonfunctional however he wants to try antibiotics. Antibiotic choice is complicated by the fact that he is on Coumadin. He also has wounds over the dorsal part of the right first which is close to closed. Right second toe has exposed bone and the right third toe at the base of the right third toe is just about closed as well. We have  been using silver alginate 2/9; patient with severe chronic venous insufficiency. He has large wounds on the left anterior mid tibia and on the left medial just above the ankle. I am concerned today about the depth of the area on the left mid tibia may be exposing into the muscle. He has small areas on the left posterior calf and on the right posterior calf although these do not look as threatening. He also has traumatic wounds on his toes. The right first and third are closed however the bilateral second toes have exposed bone. The area on the left is open into the distal interphalangeal joint. In my opinion these toes are nonsalvageable and will need amputation 2/16; patient with severe chronic venous insufficiency. He has wounds on the left anterior mid tibia left medial lower leg just above the ankle. He has small areas on the left posterior calf and a more prominent area on the right posterior calf. All of these are related to poorly controlled venous hypertension. He also had traumatic wounds on the PIPs of both second toes. Unfortunately both of these have exposed bone and in the case of the left this I think goes right into the joint itself. I do not think either 1 of these is salvageable. I have reviewed his arterial evaluation that was done in July last year. He also saw [name] in June. He felt these were venous stasis. Previously had segmental arterial pressures performed in 11/03/2014 which were normal. He was sent for for a follow-up arterial evaluation on July 1. On the right his ABIs were 1.03 with a TBI of 0.86 although his waveforms were monophasic on the left he had multiphasic waveforms at the ATA PTA but monophasic at the peroneal nevertheless his ABI was normal at 1.06 TBI also normal at 0.84. I think he has enough blood flow to heal an amputation which I think he needs of the left second toe and probably the right second toe as well 2/23; the patient is going for his  bilateral second toe amputation by Dr. Amalia Hailey on Friday. In the meantime is 3 areas on the left leg and the small area on the right posterior look better. We have been using silver collagen. 3/2; the patient's toe amputations were canceled because of cardiac concerns. Apparently this surgery will need to be done in the hospital although they do not have an appointment. In the meantime he is continues to have 3 areas on the left leg one anteriorly  and 2 smaller areas posteriorly on the left as well as the small posterior area on the right. We have been using silver collagen with compression 3/9; still no word on the second toe amputations bilaterally. 3 wounds on the left leg all look better and the area on the right posterior we have been using silver collagen I change this to [facility] 3/16; amputations next Monday both second toes he is apparently going to be hospitalized. He has on his left leg including left anterior and left lateral look better very superficial also the right posterior. No debridement is required in these areas Electronic Signature(s) Signed: 12/12/2019 5:29:05 PM By: [name] MD Entered By: [name] on 12/12/2019 09:12:49 -------------------------------------------------------------------------------- Physical Exam Details Patient Name: Date of Service: Daniel Pia D. 12/12/2019 8:00 AM Medical Record [id number] Patient Account Number: [id number] Date of Birth/Sex: Treating RN: 1941-09-21 (79 y.o. M) Primary Care Provider: Hurshel Gross Other Clinician: Referring Provider: Treating Provider/Extender:Kierston Plasencia, [name], Sullivan Gross in Treatment: 35 Cardiovascular Pedal pulses are palpable. Edema control is really quite good. Notes Wound exam; left anterior and left lateral wounds are superficial and really look like they should epithelialized. Right posterior calf looks smaller no debridement is required here. Electronic  Signature(s) Signed: 12/12/2019 5:29:05 PM By: [name] MD Entered By: [name] on 12/12/2019 09:13:53 -------------------------------------------------------------------------------- Physician Orders Details Patient Name: Date of Service: Daniel Pia D. 12/12/2019 8:00 AM Medical Record [id number] Patient Account Number: [id number] Date of Birth/Sex: Treating RN: 1940/12/22 (78 y.o. [name]) [name] Primary Care Provider: Hurshel Gross Other Clinician: Referring Provider: Treating Provider/Extender:Tyronn Golda, [name], Collin Gross in Treatment: 30 Verbal / Phone Orders: No Diagnosis Coding ICD-10 Coding Code Description L97.211 Non-pressure chronic ulcer of right calf limited to breakdown of skin L97.221 Non-pressure chronic ulcer of left calf limited to breakdown of skin I87.333 Chronic venous hypertension (idiopathic) with ulcer and inflammation of bilateral lower extremity E11.51 Type 2 diabetes mellitus with diabetic peripheral angiopathy without gangrene E11.42 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 - Tuesday **********EXTRA TIME**************** Dressing Change Frequency Wound #13 Left Toe Second Change dressing every day. Wound #19 Left,Anterior Lower Leg Do not change entire dressing for one week. Wound #21 Right Toe Second Change dressing every day. Wound #24 Left,Distal,Anterior Lower Leg Do not change entire dressing for one week. Wound #25 Left,Distal,Posterior Lower Leg Do not change entire dressing for one week. Wound #3R Right,Posterior Calf Do not change entire dressing for one  week. Skin Barriers/Peri-Wound Care TCA Cream or Ointment Wound Cleansing May shower with protection. Primary Wound Dressing Wound #13 Left Toe Second Calcium Alginate with Silver Wound #19 Left,Anterior Lower Leg Hydrofera Blue Wound #21 Right Toe Second Calcium Alginate with Silver Wound #24 Left,Distal,Anterior Lower Leg Hydrofera Blue Wound #25 Left,Distal,Posterior Lower Leg Hydrofera Blue Wound #3R Right,Posterior Calf Hydrofera Blue Secondary Dressing Dry Gauze - secure toes with tape Edema Control Unna Boots Bilaterally Off-Loading Open toe surgical shoe to: - left and right foot Electronic Signature(s) Signed: 12/12/2019 5:29:05 PM By: [name] MD Signed: 12/13/2019 8:57:53 AM By: [name] RN Entered By: [name] on 12/12/2019 09:02:04 -------------------------------------------------------------------------------- Problem List Details Patient Name: Date of Service: Daniel Pia D. 12/12/2019 8:00 AM Medical  Record [id number] Patient Account Number: [id number] Date of Birth/Sex: Treating RN: Nov 04, 1940 (78 y.o. [name]) [name] Primary Care Provider: Hurshel Gross Other Clinician: Referring Provider: Treating Provider/Extender:Britlee Skolnik, [name], [facility] Gross in Treatment: 75 Active Problems ICD-10 Evaluated Encounter Evaluated Encounter Code Description Active Date Today Diagnosis L97.211 Non-pressure chronic ulcer of right calf limited to 02/28/2019 No Yes breakdown of skin L97.221 Non-pressure chronic ulcer of left calf limited to 02/28/2019 No Yes breakdown of skin I87.333 Chronic venous hypertension (idiopathic) with ulcer 02/28/2019 No Yes and inflammation of bilateral lower extremity E11.51 Type 2 diabetes mellitus with diabetic peripheral 02/28/2019 No Yes angiopathy without gangrene E11.42 Type 2 diabetes mellitus with diabetic polyneuropathy 02/28/2019 No Yes L97.524 Non-pressure chronic ulcer of other part of left foot  10/10/2019 No Yes with necrosis of bone L97.511 Non-pressure chronic ulcer of other part of right foot 09/26/2019 No Yes limited to breakdown of skin R91.63 Chronic systolic (congestive) heart failure 10/05/2019 No Yes M86.672 Other chronic osteomyelitis, left ankle and foot 10/31/2019 No Yes Inactive Problems ICD-10 Code Description Active Date Inactive Date B35.3 Tinea pedis 09/05/2019 09/05/2019 L97.521 Non-pressure chronic ulcer of other part of left foot limited to 09/26/2019 09/26/2019 breakdown of skin Resolved Problems Electronic Signature(s) Signed: 12/12/2019 5:29:05 PM By: [name] MD Entered By: [name] on 12/12/2019 09:11:51 -------------------------------------------------------------------------------- Progress Note Details Patient Name: Date of Service: Daniel Pia D. 12/12/2019 8:00 AM Medical Record [id number] Patient Account Number: [id number] Date of Birth/Sex: Treating RN: 10/11/1940 (79 y.o. M) Primary Care Provider: Hurshel Gross Other Clinician: Referring Provider: Treating Provider/Extender:Gaylen Venning, [name], [place] Gross in Treatment: 37 Subjective History of Present Illness (HPI) The following HPI elements were documented for the patient's wound: Location: Patient presents with a wound to left lower leg. Quality: Patient reports No Pain. Duration: 2 months no cig or alcohol. spontaneous appearance in area of stasis dermamtitis. [name] on metformin only. chronic afib on Coumadin. diabetes and coag studies not good. hba1c 7.5. ivr 4.5. no pain or sxs of systemic disease. hx chf. no intermittent claudication 02/28/2019 Readmission This is a now a 79 year old man who was previously cared for in 2016 by Dr. Lindon Gross for wounds on his lower extremities. At that point he had venous reflux studies although I cannot seem to open these in [place] link. He had arterial studies showing an ABI of 1.11 on the right and 1.27 on the left his  waveforms were triphasic bilaterally. He was discharged in stockings although I do not believe he is wearing these in some time. He tells me that about a month ago he noted openings of a large wound on the posterior right calf and 2 smaller areas on the left lateral calf and a small area more recently on the left posterior calf. He has been dressing these with peroxide and triple antibiotic ointment. He is not wearing compression. Past medical history; type 2 diabetes with peripheral neuropathy, chronic venous insufficiency, hypertension, cardiomyopathy, chronic atrial fibrillation on Coumadin, prostate cancer, hyperlipidemia, gout, ABI in our clinic was 1.34 on the left and not obtainable on the right 6/9; this is a patient who has chronic venous insufficiency. He has a fairly substantial area on the right posterior calf, left lateral calf and a small area on the left posterior calf. On arrival last week he had very palpable popliteal and femoral pulses but nothing in his bilateral feet. Unfortunately we cannot get arterial studies until July 1 at [name] office. They live in [place]. We use silver alginate under  Kerlix Coban 6/16; patient with chronic venous insufficiency with wounds on his bilateral lower extremities. When he came into our clinic he was discovered to have a complete absence of peripheral pedal pulses at either the dorsalis pedis or posterior tibial. He does have easily palpable femoral and popliteal pulses. He sees [name] tomorrow for noninvasive arterial tests. He may also require venous reflux evaluation although I do not view this as an urgent thing. We have been using silver alginate. His wound surfaces of cleaned up quite nicely 6/23; patient with chronic venous insufficiency with wounds on his bilateral lower extremities. His wounds all are somewhat better looking. He did go to [name] office but somehow ended up on the doctors schedule rather than being  scheduled for noninvasive tests therefore his noninvasive tests are scheduled for July 1. We agree that he has venous insufficiency ulcers but I cannot feel any pulses in his lower extremities dictating the need for test. We are only using Kerlix and light Coban unfortunately this appears to be holding the edema 6/30; has his arterial studies tomorrow. We have been using Kerlix and light Coban will go to a more aggressive compression if the arterial studies will allow. We all agreed these are venous wounds however I cannot feel pulses at either the dorsalis pedis or posterior tibial bilaterally. His wounds generally look some better including left lateral and right posterior. 7/7-Patient returns at 1 week in Kerlix/Coban to both legs, with improvement, in the left lateral and right posterior lower leg wounds, ABI's are normal in both legs per vascular studies, TBI is also normal on both sides, we are using hydrofera blue to the wounds 7/14; patient's arterial studies from 2 Gross ago showed an ABI on the right at 1.03 with a TBI of 0.86. On the left the ABI was 1.06 with a TBI of 0.84. Notable for the fact that his arterial waveforms were monophasic in all of the lower extremity arteries suggesting some degree of arterial occlusive disease but in general this was felt to be fairly adequate for healing. His compression was increased from 2-3 layer which is appropriate. Dressing was changed to [facility] 7/21; patient's wounds are measuring smaller. The more substantial one on the right posterior calf, second 1 on the left lateral calf. Using [facility] on both wound areas 7/28; patient continues to make nice improvements. The area on the right posterior calf is smaller. Area on the left lateral calf also is smaller. We have been using Hydrofera Blue under compression. The patient will need compression stockings and we have measured him for these in the eventuality that these heal which  really should not be too long from now 8/4-Patient continues to make improvement, the right posterior calf area smaller with rim of keratotic skin on one side, the left wound is definitely smaller and improving. 8/11-Returns at 1 week, after being in 3 layer compression on both legs, both wounds appear to be improving, making good progress, patient is happy, pain is also less especially in the right leg wound 8/18; the area on the left anterior lower leg is healed. On the right posterior leg the wound remains although the dimensions are a lot better. 8/25; he arrives in clinic today with a large body of open wound on the left lateral calf. All of the 3 wounds in this area are in close juxtaposition to each other. The story is that we discharged him last week with no a wrap on the left  leg. They went to [place] could not get in as they are only excepting phone orders or online orders for stockings hence they did not put any stocking on the left leg all week. They have something at home but the patient with that was either incapable or just did not put them on. Apparently these opened 1 morning after getting out of bed. The area on the right has no real change 9/1; patient has bilateral lower extremity wounds in the setting of severe chronic venous insufficiency and secondary lymphedema. He arrived last week with new areas on the left lateral lower leg after we did not wrap him and he did not use his stockings. Nevertheless the areas on the left look better today under compression. Posterior right calf does not really changed. We are using Hydrofera Blue on both areas under compression 9/15; bilateral lower extremity wounds in the setting of severe chronic venous insufficiency and secondary lymphedema. He has 20 to 30 mmHg below-knee compression stockings under the eventuality that these close over. We did get the left leg to close but he did not transition to a stocking and this reopened. There  are 2 open areas on the left posterior lateral calf and one on the right. Both of these look satisfactory. Using [facility] 9/22; bilateral lower extremity wounds in the setting of chronic venous insufficiency. 2 superficial areas on the left lateral calf. One on the right just above the Achilles area. We have good edema control we have been using Hydrofera Blue 9/29; the areas on the left lateral calf are healed. On the right just above the Achilles and tendon area things look a lot better small wounds one scabbed area. We have been using Hydrofera Blue. We can discharge him in his own stocking on the left still wrapping on the right. This is the second time we have healed the left leg but he did not put a stocking on last time. Hopefully this will maintain the edema from chronic venous disease with secondary lymphedema 10/6; he comes in today having a stocking on the left leg. They had trouble getting it on there is a lot of increase in swelling 2 small open areas one anteriorly and one on the medial calf. They report a lot of difficulty getting the stocking on. ooParadoxically the area on the right that we have been wrapping posteriorly is closed 10/13; he comes in today with wounds bilaterally including superficial areas on the left medial and left lateral calf. As well as the right posterior has reopened in the Achilles area superiorly. He still does not have his juxta lite stockings although truthfully we would not of been able to use them today anyway. Apparently have been ordered and paid for from [employer] although they have not been delivered 10/20; his area on the right is just the boat closed on the right posterior. Still has the area on the left lateral and a very tiny area on the left medial. He has his bilateral juxta lites although he is not ready for them this week. He tolerated the increase to 4 layer compression last week quite well 10/27; the area on the right posterior calf  is once again closed. He has a superficial area on the left lateral calf that is still open. He has been using Hydrofera Blue and bilateral 4-layer compression. He can change to his own juxta lite stocking on the right and we are instructing him today 11/3; the area on the right posterior calf  reopened according to the patient and his wife after they took off the stocking when they got home last week. Apparently scabbed over there is now a fairly substantial wound which looks pretty much the same. Our intake nurse noted that they were using the juxta lite stockings appropriately. I was really hoping I might be able to close him out today. He has 1 very tiny remaining area on the left lateral lower leg. 11/10; right posterior calf wound measures smaller but is still open. We have been using Hydrofera Blue. On the left he has a small oval-shaped wound and he seems to have had another wound distally that is open and likely a blister. We are using Hydrofera Blue under compression 11/17; right posterior calf wound continues to get better. We have been using Hydrofera Blue. On the left lateral one of the wounds has closed still a small open area. We have been using Hydrofera Blue on this as well. Both areas have been under 4-layer compression Arrives in clinic today with some swelling in the dorsal foot on the right some erythema of his forefoot and toes. Initially when I looked at this I almost thought this was a sunburn distal to a wrap injury. 12/1; right posterior calf wound debrided with a curette. We have been using Hydrofera Blue on the left anterior lateral he has an area across the mid tibia. Finally a small area on the left lateral lower calf. Finally he continues to have de-epithelialized areas on the dorsal aspect of his toes. Initially thought this might be a burn injury when I saw him 2 Gross ago. I now wonder about tinea. I have also reviewed his arterial studies which were really quite  good in July/20 with normal TBI's and ABIs but monophasic waveforms 12/8; comes in today with worsening problems especially on the left leg where he now has a cluster of wounds in the left anterior mid tibia. Very poor edema control. I reduced him to 3 layer from 4 layer compression last week because of some concern about blood flow to his toes however he does not have good edema control on the left leg. Right leg edema control looks satisfactory. ooOn the left he has a cluster of wounds anteriorly, small area on the left medial fifth met head and then the collection of areas on his toes which appear better ooOn the right he has the original area on the right posterior calf, a new area right medially. His formal arterial studies from mid July are noted below. He was evaluated by [facility] ABI Findings: +---------+------------------+-----+----------+--------+ Right Rt Pressure (mmHg)IndexWaveform Comment  +---------+------------------+-----+----------+--------+ Brachial 176     +---------+------------------+-----+----------+--------+ ATA 176 0.99 monophasic  +---------+------------------+-----+----------+--------+ PTA 183 1.03 monophasic  +---------+------------------+-----+----------+--------+ PERO 172 0.97 monophasic  +---------+------------------+-----+----------+--------+ Great Toe153 0.86    +---------+------------------+-----+----------+--------+ +---------+------------------+-----+-----------+-------+ Left Lt Pressure (mmHg)IndexWaveform Comment +---------+------------------+-----+-----------+-------+ Brachial 178     +---------+------------------+-----+-----------+-------+ ATA 162 0.91 multiphasic  +---------+------------------+-----+-----------+-------+ PTA 188 1.06 multiphasic  +---------+------------------+-----+-----------+-------+ PERO 158 0.89 monophasic    +---------+------------------+-----+-----------+-------+ Great Toe150 0.84    +---------+------------------+-----+-----------+-------+ +-------+-----------+-----------+------------+------------+ ABI/TBIToday's ABIToday's TBIPrevious ABIPrevious TBI +-------+-----------+-----------+------------+------------+ Right 1.03 0.86 1.11   +-------+-----------+-----------+------------+------------+ Left 1.06 0.84 1.27   +-------+-----------+-----------+------------+------------+ Tibial waveforms somewhat difficult to record due to irregular heartbeat. Bilateral ABIs appear essentially unchanged compared to prior study on 06/21/15. Summary: Right: Resting right ankle-brachial index is within normal range. No evidence of significant right lower extremity arterial disease. The right toe-brachial index is normal. Although ankle brachial indices are within normal limits (0.95-1.29), arterial Doppler  waveforms at the ankle suggest some component of arterial occlusive disease. Left: Resting left ankle-brachial index is within normal range. No evidence of significant left lower extremity arterial disease. The left toe-brachial index is normal. Although ankle brachial indices are within normal limits (0.95-1.29), arterial Doppler waveforms at the ankle suggest some component of arterial occlusive disease. 12/15; the patient's area on the left mid tibia looks better. Right posterior calf also better. He has the area on the left foot as well. All of his toes look better I think this was tinea. We are using Hydrofera Blue everywhere else The patient was in urgent care yesterday with wheezing and shortness of breath. He got azithromycin and prednisone. He feels better. His lungs are currently clear to auscultation. He was not tested for Covid 19 12/29; the patient arrives in clinic today with quite a bit change. 2 Gross ago he only had areas on the left mid tibia right posterior calf  with tinea pedis resolving between his toes. He arrives in clinic today with several areas on the dorsal toes on the right, dorsal left second toe. He has skin breakdown in the left medial calf probably from excess edema. Small area proximally in the medial calf. He has weeping edema fluid coming out of the skin excoriations on the left medial calf. He tells me that he is having a cardiac catheterization next week. I had a quick look at [facility] health link. He was Gross to have an ejection fraction of 25% during the work-up for persistent atrial fibrillation. He saw his cardiology office yesterday seen by the nurse practitioner. She increased his carvedilol. He has not been on diuretics for apparently several months and indeed in the nurse practitioner Dietrich Pates notes she had knowledge of this. 10/04/2019 on evaluation today patient presents as a walk-in visit concerning the fact that he did not have an appointment here for our clinic at this point. He actually had a cardiac catheterization earlier today and then came from there to here in order to be evaluated. With that being said unfortunately he is having significant cellulitis of his left lower extremity upon evaluation today this appears to have deteriorated even since last week's evaluation with Dr. Dellia Nims. The right lower extremity is actually doing okay I really see no evidence of deterioration at this point at those locations. In fact the right leg seems in general be doing quite well. Nonetheless I am concerned about infection and cellulitis of the left lower extremity and again considering his weakened heart I do not want him to develop into sepsis at all. He is also having some trouble breathing today and I understand according to nursing staff this is always the case to some degree. With that being said the patient unfortunately seems to be in my opinion a little bit worse even his wife feels like that may be the case today. Unsure  exactly what is leading to this. Cardiac catheterization I did review the report which showed an ejection fraction of 25% he also had an LAD blockage of around 25% based on what I saw. With that being said there was no significant blockages that required stenting at this point he does have weakened cardiac muscles compared to normal. 10/05/2019; patient was seen yesterday in clinic. He was sent to the ER because of cellulitis of the left leg possibility. In the ER he was given 1 dose of IV Levaquin and discharged on Keflex. He came in the clinic initially for a nurse  visit to rewrap his left leg. We did not look at the right leg today that is an Haematologist. The patient also had a cardiac cath. According to him there were no blockages but a very low ejection fraction. 1/12; back for an early follow-up. The condition of the left lower leg is a lot better although there are multiple open areas. All of them with not a particularly viable surface. On the right posterior calf he has a single wound with a clean surface. He has a wound with exposed bone on the PIP of the left second toe dorsally. He has wounds on the dorsal right first second and third toes. His arterial studies were normal. 1/19; the patient has 3 open wounds on the left leg anteriorly in the mid tibia, distally and medially just above the ankle and posteriorly. On the right he has a small area dime sized on the right posterior calf. His edema control is a lot better. He still has wounds on the right second and left second toes. The left second toe has exposed bone. X- ray I did last week did not show evidence of osteomyelitis in the left foot. 1/26; patient with a multiplicity of wounds and problems. ooOn the left he has a circular area on the left anterior mid tibia ooLeft just above the medical ankle -left 2nd toe pip -right 2nd toe right posterior calf 2/2; patient with a multiplicity of wounds and problems. He has severe chronic  venous insufficiency and the wounds on his leg are all on the left left anterior left medial at the medial malleolus and left posterior. We have been using Iodoflex to these areas to help with ongoing debridement. He also has largely traumatic wounds on his toes this includes the left second with exposed bone. Bone culture I did last week showed Staph aureus which is methicillin sensitive. I discussed with him today the idea of an amputation of this toe because it is literally nonfunctional however he wants to try antibiotics. Antibiotic choice is complicated by the fact that he is on Coumadin. He also has wounds over the dorsal part of the right first which is close to closed. Right second toe has exposed bone and the right third toe at the base of the right third toe is just about closed as well. We have been using silver alginate 2/9; patient with severe chronic venous insufficiency. He has large wounds on the left anterior mid tibia and on the left medial just above the ankle. I am concerned today about the depth of the area on the left mid tibia may be exposing into the muscle. He has small areas on the left posterior calf and on the right posterior calf although these do not look as threatening. He also has traumatic wounds on his toes. The right first and third are closed however the bilateral second toes have exposed bone. The area on the left is open into the distal interphalangeal joint. In my opinion these toes are nonsalvageable and will need amputation 2/16; patient with severe chronic venous insufficiency. He has wounds on the left anterior mid tibia left medial lower leg just above the ankle. He has small areas on the left posterior calf and a more prominent area on the right posterior calf. All of these are related to poorly controlled venous hypertension. He also had traumatic wounds on the PIPs of both second toes. Unfortunately both of these have exposed bone and in the case of  the left  this I think goes right into the joint itself. I do not think either 1 of these is salvageable. I have reviewed his arterial evaluation that was done in July last year. He also saw [name] in June. He felt these were venous stasis. Previously had segmental arterial pressures performed in 11/03/2014 which were normal. He was sent for for a follow-up arterial evaluation on July 1. On the right his ABIs were 1.03 with a TBI of 0.86 although his waveforms were monophasic on the left he had multiphasic waveforms at the ATA PTA but monophasic at the peroneal nevertheless his ABI was normal at 1.06 TBI also normal at 0.84. I think he has enough blood flow to heal an amputation which I think he needs of the left second toe and probably the right second toe as well 2/23; the patient is going for his bilateral second toe amputation by Dr. Amalia Hailey on Friday. In the meantime is 3 areas on the left leg and the small area on the right posterior look better. We have been using silver collagen. 3/2; the patient's toe amputations were canceled because of cardiac concerns. Apparently this surgery will need to be done in the hospital although they do not have an appointment. In the meantime he is continues to have 3 areas on the left leg one anteriorly and 2 smaller areas posteriorly on the left as well as the small posterior area on the right. We have been using silver collagen with compression 3/9; still no word on the second toe amputations bilaterally. 3 wounds on the left leg all look better and the area on the right posterior we have been using silver collagen I change this to [facility] 3/16; amputations next Monday both second toes he is apparently going to be hospitalized. He has on his left leg including left anterior and left lateral look better very superficial also the right posterior. No debridement is required in these areas Objective Constitutional Vitals Time Taken: 8:27 AM, Height: 74  in, Source: Stated, Weight: 212 lbs, Source: Stated, BMI: 27.2, Temperature: 97.8 F, Pulse: 97 bpm, Respiratory Rate: 20 breaths/min, Blood Pressure: 120/83 mmHg. Cardiovascular Pedal pulses are palpable. Edema control is really quite good. General Notes: Wound exam; left anterior and left lateral wounds are superficial and really look like they should epithelialized. ooRight posterior calf looks smaller no debridement is required here. Integumentary (Hair, Skin) Wound #13 status is Open. Original cause of wound was Gradually Appeared. The wound is located on the Left Toe Second. The wound measures 1.4cm length x 1.4cm width x 0.2cm depth; 1.539cm^2 area and 0.308cm^3 volume. There is bone, joint, and Fat Layer (Subcutaneous Tissue) Exposed exposed. There is no tunneling or undermining noted. There is a small amount of serosanguineous drainage noted. The wound margin is distinct with the outline attached to the wound base. There is small (1-33%) granulation within the wound bed. There is a large (67-100%) amount of necrotic tissue within the wound bed including Adherent Slough. Wound #19 status is Open. Original cause of wound was Gradually Appeared. The wound is located on the Left,Anterior Lower Leg. The wound measures 0.9cm length x 0.7cm width x 0.1cm depth; 0.495cm^2 area and 0.049cm^3 volume. There is Fat Layer (Subcutaneous Tissue) Exposed exposed. There is no tunneling or undermining noted. There is a small amount of serosanguineous drainage noted. The wound margin is flat and intact. There is large (67-100%) red, hyper - granulation within the wound bed. There is no necrotic tissue within the  wound bed. Wound #21 status is Open. Original cause of wound was Gradually Appeared. The wound is located on the Right Toe Second. The wound measures 1.9cm length x 1.5cm width x 0.3cm depth; 2.238cm^2 area and 0.672cm^3 volume. There is bone and Fat Layer (Subcutaneous Tissue) Exposed exposed.  There is no tunneling or undermining noted. There is a small amount of serosanguineous drainage noted. The wound margin is flat and intact. There is no granulation within the wound bed. There is a large (67-100%) amount of necrotic tissue within the wound bed including Eschar and Adherent Slough. Wound #24 status is Open. Original cause of wound was Gradually Appeared. The wound is located on the [facility] Lower Leg. The wound measures 1.3cm length x 0.8cm width x 0.1cm depth; 0.817cm^2 area and 0.082cm^3 volume. There is Fat Layer (Subcutaneous Tissue) Exposed exposed. There is no tunneling or undermining noted. There is a small amount of serosanguineous drainage noted. The wound margin is flat and intact. There is large (67-100%) red granulation within the wound bed. There is no necrotic tissue within the wound bed. Wound #25 status is Open. Original cause of wound was Gradually Appeared. The wound is located on the Left,Distal,Posterior Lower Leg. The wound measures 1cm length x 1.2cm width x 0.1cm depth; 0.942cm^2 area and 0.094cm^3 volume. There is Fat Layer (Subcutaneous Tissue) Exposed exposed. There is no tunneling or undermining noted. There is a small amount of serosanguineous drainage noted. The wound margin is flat and intact. There is medium (34-66%) pink granulation within the wound bed. There is a medium (34-66%) amount of necrotic tissue within the wound bed including Adherent Slough. Wound #28 status is Healed - Epithelialized. Original cause of wound was Gradually Appeared. The wound is located on the Left,Proximal,Posterior Lower Leg. The wound measures 0cm length x 0cm width x 0cm depth; 0cm^2 area and 0cm^3 volume. There is no tunneling or undermining noted. There is a none present amount of drainage noted. The wound margin is flat and intact. There is no granulation within the wound bed. There is no necrotic tissue within the wound bed. Wound #3R status is Open.  Original cause of wound was Gradually Appeared. The wound is located on the Right,Posterior Calf. The wound measures 1.9cm length x 1.8cm width x 0.1cm depth; 2.686cm^2 area and 0.269cm^3 volume. There is Fat Layer (Subcutaneous Tissue) Exposed exposed. There is no tunneling or undermining noted. There is a small amount of serosanguineous drainage noted. The wound margin is distinct with the outline attached to the wound base. There is large (67-100%) red granulation within the wound bed. There is no necrotic tissue within the wound bed. 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 There was a Haematologist Compression Therapy Procedure by [name], RN. Post procedure Diagnosis Wound #19: Same as Pre-Procedure Wound #24 Pre-procedure diagnosis of Wound #24 is a Venous Leg Ulcer located on  the Left,Distal,Anterior Lower Leg . There was a Haematologist Compression Therapy Procedure by [name], RN. Post procedure Diagnosis Wound #24: Same as Pre-Procedure Wound #25 Pre-procedure diagnosis of Wound #25 is a Venous Leg Ulcer located on the Left,Distal,Posterior Lower Leg . There was a Haematologist Compression Therapy Procedure by [name], RN. Post procedure Diagnosis  Wound #25: Same as Pre-Procedure Wound #3R Pre-procedure diagnosis of Wound #3R is a Diabetic Wound/Ulcer of the Lower Extremity located on the Right,Posterior Calf . There was a Haematologist Compression Therapy Procedure by [name], RN. Post procedure Diagnosis Wound #3R: Same as Pre-Procedure Plan Follow-up Appointments: Return Appointment in 1 week. - Tuesday **********EXTRA TIME**************** Dressing Change Frequency: Wound #13 Left Toe Second: Change dressing every day. Wound #19 Left,Anterior Lower Leg: Do not change entire dressing for one week. Wound #21 Right Toe Second: Change dressing every day. Wound #24 Left,Distal,Anterior Lower Leg: Do not change entire dressing for one week. Wound #25 Left,Distal,Posterior Lower Leg: Do not change entire dressing for one week. Wound #3R Right,Posterior Calf: Do not change entire dressing for one week. Skin Barriers/Peri-Wound Care: TCA Cream or Ointment Wound Cleansing: May shower with protection. Primary Wound Dressing: Wound #13 Left Toe Second: Calcium Alginate with Silver Wound #19 Left,Anterior Lower Leg: Hydrofera Blue Wound #21 Right Toe Second: Calcium Alginate with Silver Wound #24 Left,Distal,Anterior Lower Leg: Hydrofera Blue Wound #25 Left,Distal,Posterior Lower Leg: Hydrofera Blue Wound #3R Right,Posterior Calf: Hydrofera Blue Secondary Dressing: Dry Gauze - secure toes with tape Edema Control: Unna Boots Bilaterally Off-Loading: Open toe surgical shoe to: - left and right foot 1. We are using calcium alginate to the left anterior lower leg 2. Hydrofera Blue to the left distal lower leg. 3. Hydrofera Blue to the right posterior calf. 4. All of this and Unna boots which she seems to find comfortable. 5. They will call us after they are out of the hospital next week hopefully everything will go well Electronic Signature(s) Signed: 12/12/2019 5:29:05 PM By: [name] MD Entered By: [name]  on 12/12/2019 09:15:09 -------------------------------------------------------------------------------- [place] Details Patient Name: Date of Service: [name] 12/12/2019 Medical Record [id number] Patient Account Number: [id number] Date of Birth/Sex: Treating RN: Sep 08, 1941 (78 y.o. [name]) Dolores Lory, [place] Primary Care Provider: Hurshel Gross Other Clinician: Referring Provider: Treating Provider/Extender:Preslyn Warr, [name], McL[place] Gross in Treatment: 41 Diagnosis Coding ICD-10 Codes Code Description L97.211 Non-pressure chronic ulcer of right calf limited to breakdown of skin L97.221 Non-pressure chronic ulcer of left calf limited to breakdown of skin I87.333 Chronic venous hypertension (idiopathic) with ulcer and inflammation of bilateral lower extremity E11.51 Type 2 diabetes mellitus with diabetic peripheral angiopathy without gangrene E11.42 Type 2 diabetes mellitus with diabetic polyneuropathy L97.524 Non-pressure chronic ulcer of other part of left foot with necrosis of bone L97.511 Non-pressure chronic ulcer of other part of right foot limited to breakdown of skin I10.30 Chronic systolic (congestive) heart failure D31.438 Other chronic osteomyelitis, left ankle and foot Facility Procedures The patient participates with Medicare or their insurance follows the Medicare Facility Guidelines: CPT4 Code Description Modifier Quantity [id number] [id number] - APPLY Dareen Piano BILATERAL 1 Physician Procedures CPT4: Code [id number] Description: 213 - WC PHYS LEVEL 3 - EST PT ICD-10 Diagnosis Description L97.211 Non-pressure chronic ulcer of right calf limited to brea L97.221 Non-pressure chronic ulcer of left calf limited to break I87.333 Chronic venous hypertension (idiopathic)  with ulcer and lower extremity Modifier: kdown of skin down of skin inflammation of Quantity: 1 bilateral Electronic Signature(s)  Signed: 12/12/2019 5:29:05 PM By: [name] MD Entered By:  [name] on 12/12/2019 09:15:27

## 2019-12-14 ENCOUNTER — Telehealth (INDEPENDENT_AMBULATORY_CARE_PROVIDER_SITE_OTHER): Payer: Self-pay

## 2019-12-14 ENCOUNTER — Encounter (INDEPENDENT_AMBULATORY_CARE_PROVIDER_SITE_OTHER): Payer: Self-pay | Admitting: Internal Medicine

## 2019-12-14 ENCOUNTER — Other Ambulatory Visit: Payer: Self-pay

## 2019-12-14 ENCOUNTER — Ambulatory Visit (INDEPENDENT_AMBULATORY_CARE_PROVIDER_SITE_OTHER): Payer: Medicare HMO | Admitting: Internal Medicine

## 2019-12-14 ENCOUNTER — Other Ambulatory Visit (HOSPITAL_COMMUNITY)
Admission: RE | Admit: 2019-12-14 | Discharge: 2019-12-14 | Disposition: A | Discharge status: Home / Self Care | Payer: Medicare HMO | Source: Ambulatory Visit | Attending: Podiatry | Admitting: Podiatry

## 2019-12-14 VITALS — BP 120/70 | HR 72 | Temp 97.2°F | Resp 20 | Ht 72.0 in | Wt 195.4 lb

## 2019-12-14 DIAGNOSIS — E119 Type 2 diabetes mellitus without complications: Secondary | ICD-10-CM | POA: Diagnosis not present

## 2019-12-14 DIAGNOSIS — M1A0310 Idiopathic chronic gout, right wrist, without tophus (tophi): Secondary | ICD-10-CM

## 2019-12-14 DIAGNOSIS — Z20822 Contact with and (suspected) exposure to covid-19: Secondary | ICD-10-CM | POA: Insufficient documentation

## 2019-12-14 DIAGNOSIS — I10 Essential (primary) hypertension: Secondary | ICD-10-CM

## 2019-12-14 DIAGNOSIS — Z7901 Long term (current) use of anticoagulants: Secondary | ICD-10-CM | POA: Diagnosis not present

## 2019-12-14 DIAGNOSIS — Z01812 Encounter for preprocedural laboratory examination: Secondary | ICD-10-CM | POA: Insufficient documentation

## 2019-12-14 MED ORDER — ALLOPURINOL 300 MG PO TABS: 300.0000 mg | ORAL_TABLET | Freq: Two times a day (BID) | ORAL | 0 refills | Status: DC

## 2019-12-14 NOTE — Progress Notes (Signed)
Metrics: Intervention Frequency ACO  Documented Smoking Status Yearly  Screened one or more times in 24 months  Cessation Counseling or  Active cessation medication Past 24 months  Past 24 months   Guideline developer: UpToDate (See UpToDate for funding source) Date Released: 2014       Wellness Office Visit  Subjective:  Patient ID: [name], male    DOB: 07-19-1941  Age: 79 y.o. MRN: [id number]  CC: This 79 year old man comes in to be medically cleared for upcoming foot surgery.  HPI  Although he has congestive heart failure, he is currently stable.  I have checked with both his cardiologist and they agree that he would be a reasonable candidate to undergo this surgery. He has diabetes, gout, hypertension as well as congestive heart failure. Past Medical History:  Diagnosis Date  . Cancer ([place])   . CHF (congestive heart failure) ([place])   . Chronic atrial fibrillation ([place])   . Chronic bronchitis   . Diabetes mellitus, type II ([place])    no insulin  . Gout   . History of herpes zoster virus   . History of radiation therapy 12/21/12- 02/15/13   prostate 78 gray in 40 fx  . Hyperlipidemia   . Hypertension   . Prostate cancer ([place]) 2014   EBRT + hormonal therapy  . Vitamin D deficiency disease 06/07/2019      Family History  Problem Relation Age of Onset  . Kidney failure Mother   . Heart attack Father     Social History   Social History Narrative  . Not on file   Social History   Tobacco Use  . Smoking status: Never Smoker  . Smokeless tobacco: Never Used  Substance Use Topics  . Alcohol use: No    Alcohol/week: 0.0 standard drinks    Current Meds  Medication Sig  . acetaminophen (TYLENOL) 500 MG tablet Take 500-1,000 mg by mouth every 6 (six) hours as needed for mild pain or headache.   . allopurinol (ZYLOPRIM) 300 MG tablet Take 1 tablet (300 mg total) by mouth daily. (Patient taking differently: Take 300 mg by mouth 2 (two) times daily. )  . ascorbic  acid (VITAMIN C) 250 MG CHEW Chew 500 mg by mouth daily.   . carvedilol (COREG) 25 MG tablet Take 1.5 tablets (37.5 mg total) by mouth 2 (two) times daily.  . cephALEXin (KEFLEX) 500 MG capsule Take 1 capsule (500 mg total) by mouth 4 (four) times daily. (Patient not taking: Reported on 12/14/2019)  . Cholecalciferol (VITAMIN D-3) 125 MCG (5000 UT) TABS Take 5,000 Units by mouth daily.   . furosemide (LASIX) 40 MG tablet Take 40 mg one day, alternating with 20 mg the other day (Patient not taking: Reported on 12/14/2019)  . glipiZIDE (GLUCOTROL) 5 MG tablet Take 1 tablet (5 mg total) by mouth daily.  Marland Kitchen lovastatin (MEVACOR) 40 MG tablet Take 1 tablet (40 mg total) by mouth at bedtime. (Patient taking differently: Take 20 mg by mouth at bedtime. )  . NON FORMULARY Take 3 tablets by mouth daily. MANGA-CAL  . oxyCODONE-acetaminophen (PERCOCET) 5-325 MG tablet Take 1 tablet by mouth every 6 (six) hours as needed for severe pain.  . sacubitril-valsartan (ENTRESTO) 24-26 MG Take 1 tablet by mouth 2 (two) times daily.  Marland Kitchen spironolactone (ALDACTONE) 25 MG tablet Take 1 tablet (25 mg total) by mouth daily.  . vitamin E 400 UNIT capsule Take 400 Units by mouth daily.   Marland Kitchen warfarin (COUMADIN) 5  MG tablet Take 1 tablet (5 mg total) by mouth See admin instructions. 2.5 mg on Saturday All the other day take 5 mg (Patient taking differently: Take 2.5-5 mg by mouth See admin instructions. Take 5 mg at night except Saturday night take 2.5 mg)  . [DISCONTINUED] guaifenesin (WAL-TUSSIN) 100 MG/5ML syrup Take 200 mg by mouth 3 (three) times daily as needed for cough.   . [DISCONTINUED] zinc gluconate 50 MG tablet Take 25 mg by mouth daily.        Objective:   Today's Vitals: BP 120/70 (BP Location: Right Arm, Patient Position: Sitting, Cuff Size: Normal)   Pulse 72   Temp (!) 97.2 F (36.2 C)   Resp 20   Ht 6' (1.829 m)   Wt 195 lb 6.4 oz (88.6 kg)   SpO2 98% Comment: wearing mask.  BMI 26.50 kg/m  Vitals  with BMI 12/14/2019 11/15/2019 10/13/2019  Height 6\' 0"  6' 2.5" 6' 2.5"  Weight 195 lbs 6 oz 201 lbs 3 oz 195 lbs 13 oz  BMI 26.5 [id number]  Systolic [id number] [id number]  Diastolic 70 70 90  Pulse 72 70 100     Physical Exam   Although he looks chronically sick, he is not acutely unwell.  He is hemodynamically stable.  Lung fields are clear.  There is no clinical evidence of heart failure.   Assessment   1. Essential hypertension   2. Type 2 diabetes mellitus without complication, without long-term current use of insulin ([place])   3. Chronic anticoagulation       Tests ordered No orders of the defined types were placed in this encounter.    Plan: 1. I have filled out the form to medically clear this man for foot surgery in [place] that is upcoming in about 4 days time.  I would recommend discontinuing warfarin 2 to 3 days prior to surgery and cover him with heparin during the surgery. 2. He will follow-up with Judson Roch in about 3 months time.   No orders of the defined types were placed in this encounter.   Doree Albee, MD

## 2019-12-14 NOTE — Telephone Encounter (Signed)
Refill sent to [facility] 

## 2019-12-15 ENCOUNTER — Other Ambulatory Visit: Payer: Self-pay

## 2019-12-15 ENCOUNTER — Encounter (HOSPITAL_COMMUNITY): Payer: Self-pay | Admitting: Podiatry

## 2019-12-15 LAB — SARS CORONAVIRUS 2 (TAT 6-24 HRS): SARS Coronavirus 2: NEGATIVE

## 2019-12-15 NOTE — Progress Notes (Signed)
Anesthesia Chart Review:  Pt is a same day work up    Case: [id number] Date/Time: 12/18/19 1215   Procedure: BILATERAL 2ND TOE AMPUTATION DIGIT (Bilateral )   Anesthesia type: Monitor Anesthesia Care   Pre-op diagnosis: OSTEOMYLITIS   Location: [facility] ROOM 06 / [place] [facility]   Surgeons: Edrick Kins, DPM      DISCUSSION:  Pt is a 79 year old with hx CHF/NICM (EF 20-25% by 08/2019 echo), chronic atrial fibrillation, HTN, DM  Pt to hold coumadin starting 12/13/19   PROVIDERS: - PCP is Ailene Ards, NP. Last office visit 12/14/19 with Hurshel Party, MD who cleared pt for surgery (note indicates he discussed with cardiology) and filled out H&P form for Dr. Amalia Hailey - Cardiologist is Kate Sable, MD. Last office visit 11/15/19 with Bernerd Pho, PA   LABS: Will be obtained day of surgery   IMAGES:  CXR 09/26/19:  - Mild cardiomegaly with central vascular congestion and small bilateral pleural effusions. Mild airspace disease at the bases, likely atelectasis.   EKG 09/25/19: atrial fibrillation. LBBB   CV:  Echocardiogram: 08/2019: IMPRESSIONS  1. Left ventricular ejection fraction, by visual estimation, is 20 to 25%. The left ventricle has severely decreased function. There is mildly increased left ventricular hypertrophy.  2. Abnormal septal motion consistent with left bundle branch block.  3. Left ventricular diastolic parameters are indeterminate.  4. Mildly dilated left ventricular internal cavity size.  5. The left ventricle demonstrates global hypokinesis.  6. Global right ventricle has normal systolic function.The right ventricular size is normal. No increase in right ventricular wall thickness.  7. Left atrial size was severely dilated.  8. Right atrial size was severely dilated.  9. Mild mitral annular calcification.  10. The mitral valve is grossly normal. Mild mitral valve regurgitation.  11. The tricuspid valve is grossly normal. Tricuspid valve regurgitation  moderate.  12. The aortic valve is tricuspid and moderately calcificed with decreased cusp excursion. Gradients are not significantly increased, but in the setting of reduced LVEF, difficult to exclude low gradient aortic stenosis. Aortic valve regurgitation is not visualized.  13. The pulmonic valve was grossly normal. Pulmonic valve regurgitation is trivial.  14. Moderately elevated pulmonary artery systolic pressure.  15. The tricuspid regurgitant velocity is 3.23 m/s, and with an assumed right atrial pressure of 15 mmHg, the estimated right ventricular systolic pressure is moderately elevated at 56.7 mmHg.    Cardiac Catheterization: 09/2019:  Widely patent coronary arteries, essentially normal for the patient's age. Trivial mid LAD less than 25% eccentric plaque.  Mild pulmonary hypertension with mean pulmonary artery pressure of 31 mmHg  Severe left ventricular systolic dysfunction with EF less than 25%. Elevated LVEDP at 21 mmHg consistent with acute on chronic combined systolic and diastolic heart failure. -RECOMMENDATIONS:  Guideline directed therapy for heart failure and atrial fibrillation rate control. Heart rate was relatively fast during the procedure.  Resume usual Coumadin dose later today.  Discharge home later today.    Past Medical History:  Diagnosis Date  . Cancer ([place])   . CHF (congestive heart failure) ([place])   . Chronic atrial fibrillation ([place])   . Chronic bronchitis   . Diabetes mellitus, type II ([place])    no insulin  . Gout   . History of herpes zoster virus   . History of radiation therapy 12/21/12- 02/15/13   prostate 78 gray in 40 fx  . Hyperlipidemia   . Hypertension   . Prostate cancer ([place]) 2014  EBRT + hormonal therapy  . Vitamin D deficiency disease 06/07/2019    Past Surgical History:  Procedure Laterality Date  . KNEE SURGERY     left knee  . PROSTATE BIOPSY    . RIGHT/LEFT HEART CATH AND CORONARY ANGIOGRAPHY N/A 10/04/2019    Procedure: RIGHT/LEFT HEART CATH AND CORONARY ANGIOGRAPHY;  Surgeon: Belva Crome, MD;  Location: [facility];  Service: Cardiovascular;  Laterality: N/A;    MEDICATIONS: No current facility-administered medications for this encounter.   Marland Kitchen acetaminophen (TYLENOL) 500 MG tablet  . ascorbic acid (VITAMIN C) 250 MG CHEW  . carvedilol (COREG) 25 MG tablet  . Cholecalciferol (VITAMIN D-3) 125 MCG (5000 UT) TABS  . furosemide (LASIX) 20 MG tablet  . glipiZIDE (GLUCOTROL) 5 MG tablet  . lovastatin (MEVACOR) 40 MG tablet  . NON FORMULARY  . sacubitril-valsartan (ENTRESTO) 24-26 MG  . spironolactone (ALDACTONE) 25 MG tablet  . vitamin E 400 UNIT capsule  . warfarin (COUMADIN) 5 MG tablet  . allopurinol (ZYLOPRIM) 300 MG tablet  . cephALEXin (KEFLEX) 500 MG capsule  . furosemide (LASIX) 40 MG tablet  . oxyCODONE-acetaminophen (PERCOCET) 5-325 MG tablet   Pt to hold coumadin starting 12/13/19   If labs acceptable day of surgery, I anticipate pt can proceed with surgery as scheduled.  Willeen Cass, FNP-BC [facility]/Anesthesiology Phone: [phone] 12/15/2019 1:12 PM

## 2019-12-15 NOTE — Progress Notes (Signed)
SDW-pre-op call completed by both pt and spouse, Hoyle Sauer ( with pt verbal consent) over speaker phone. Pt denies SOB and chest pain. Spouse stated that pt is under the care of Dr. Bronson Ing, Cardiology and Dr. Anastasio Champion, PCP. Spouse stated that pt last dose of Coumadin was 12/11/19 as instructed by Dr. Bronson Ing. Spouse stated that pt was instructed to hold Coumadin for 5 days prior to surgery. Spouse made aware to have pt stop taking vitamins, fish oil and herbal medications. Do not take any NSAIDs ie: Ibuprofen, Advil, Naproxen (Aleve), Motrin, BC and Goody Powder. Spouse reminded to have pt quarantine. Pt stated that he does not check his CBG. Pt made aware to not take Glipizide on DOS. Both pt and spouse verbalized understanding of all pre-op instructions. See NP, Anesthesiology, note.

## 2019-12-18 ENCOUNTER — Ambulatory Visit (HOSPITAL_COMMUNITY): Payer: Medicare HMO | Admitting: Emergency Medicine

## 2019-12-18 ENCOUNTER — Encounter (HOSPITAL_COMMUNITY): Payer: Self-pay | Admitting: Podiatry

## 2019-12-18 ENCOUNTER — Encounter (HOSPITAL_COMMUNITY)
Admission: RE | Disposition: A | Discharge status: Home / Self Care | Payer: Self-pay | Source: Home / Self Care | Attending: Podiatry

## 2019-12-18 ENCOUNTER — Ambulatory Visit (HOSPITAL_COMMUNITY): Payer: Medicare HMO

## 2019-12-18 ENCOUNTER — Encounter: Payer: Self-pay | Admitting: Podiatry

## 2019-12-18 ENCOUNTER — Other Ambulatory Visit: Payer: Self-pay

## 2019-12-18 ENCOUNTER — Ambulatory Visit (HOSPITAL_COMMUNITY)
Admission: RE | Admit: 2019-12-18 | Discharge: 2019-12-18 | Disposition: A | Discharge status: Home / Self Care | Payer: Medicare HMO | Attending: Podiatry | Admitting: Podiatry

## 2019-12-18 DIAGNOSIS — Z8546 Personal history of malignant neoplasm of prostate: Secondary | ICD-10-CM | POA: Insufficient documentation

## 2019-12-18 DIAGNOSIS — E1169 Type 2 diabetes mellitus with other specified complication: Secondary | ICD-10-CM | POA: Insufficient documentation

## 2019-12-18 DIAGNOSIS — I447 Left bundle-branch block, unspecified: Secondary | ICD-10-CM | POA: Insufficient documentation

## 2019-12-18 DIAGNOSIS — I509 Heart failure, unspecified: Secondary | ICD-10-CM | POA: Diagnosis not present

## 2019-12-18 DIAGNOSIS — Z7984 Long term (current) use of oral hypoglycemic drugs: Secondary | ICD-10-CM | POA: Diagnosis not present

## 2019-12-18 DIAGNOSIS — I482 Chronic atrial fibrillation, unspecified: Secondary | ICD-10-CM | POA: Insufficient documentation

## 2019-12-18 DIAGNOSIS — E785 Hyperlipidemia, unspecified: Secondary | ICD-10-CM | POA: Insufficient documentation

## 2019-12-18 DIAGNOSIS — M869 Osteomyelitis, unspecified: Secondary | ICD-10-CM | POA: Insufficient documentation

## 2019-12-18 DIAGNOSIS — M86671 Other chronic osteomyelitis, right ankle and foot: Secondary | ICD-10-CM | POA: Diagnosis not present

## 2019-12-18 DIAGNOSIS — B957 Other staphylococcus as the cause of diseases classified elsewhere: Secondary | ICD-10-CM | POA: Insufficient documentation

## 2019-12-18 DIAGNOSIS — I110 Hypertensive heart disease with heart failure: Secondary | ICD-10-CM | POA: Insufficient documentation

## 2019-12-18 DIAGNOSIS — Z89422 Acquired absence of other left toe(s): Secondary | ICD-10-CM | POA: Diagnosis not present

## 2019-12-18 DIAGNOSIS — Z79899 Other long term (current) drug therapy: Secondary | ICD-10-CM | POA: Diagnosis not present

## 2019-12-18 DIAGNOSIS — Z7901 Long term (current) use of anticoagulants: Secondary | ICD-10-CM | POA: Insufficient documentation

## 2019-12-18 DIAGNOSIS — Z1629 Resistance to other single specified antibiotic: Secondary | ICD-10-CM | POA: Insufficient documentation

## 2019-12-18 DIAGNOSIS — I081 Rheumatic disorders of both mitral and tricuspid valves: Secondary | ICD-10-CM | POA: Insufficient documentation

## 2019-12-18 DIAGNOSIS — Z881 Allergy status to other antibiotic agents status: Secondary | ICD-10-CM | POA: Diagnosis not present

## 2019-12-18 DIAGNOSIS — S98132A Complete traumatic amputation of one left lesser toe, initial encounter: Secondary | ICD-10-CM | POA: Diagnosis not present

## 2019-12-18 DIAGNOSIS — I272 Pulmonary hypertension, unspecified: Secondary | ICD-10-CM | POA: Insufficient documentation

## 2019-12-18 DIAGNOSIS — M86672 Other chronic osteomyelitis, left ankle and foot: Secondary | ICD-10-CM | POA: Diagnosis not present

## 2019-12-18 DIAGNOSIS — M109 Gout, unspecified: Secondary | ICD-10-CM | POA: Insufficient documentation

## 2019-12-18 DIAGNOSIS — Z888 Allergy status to other drugs, medicaments and biological substances status: Secondary | ICD-10-CM | POA: Diagnosis not present

## 2019-12-18 DIAGNOSIS — Z89421 Acquired absence of other right toe(s): Secondary | ICD-10-CM | POA: Diagnosis not present

## 2019-12-18 DIAGNOSIS — Z09 Encounter for follow-up examination after completed treatment for conditions other than malignant neoplasm: Secondary | ICD-10-CM

## 2019-12-18 DIAGNOSIS — I5042 Chronic combined systolic (congestive) and diastolic (congestive) heart failure: Secondary | ICD-10-CM | POA: Diagnosis not present

## 2019-12-18 HISTORY — PX: AMPUTATION: SHX166

## 2019-12-18 LAB — GLUCOSE, CAPILLARY
Glucose-Capillary: 151 mg/dL — ABNORMAL HIGH (ref 70–99)
Glucose-Capillary: 123 mg/dL — ABNORMAL HIGH (ref 70–99)

## 2019-12-18 LAB — POCT I-STAT, CHEM 8
BUN: 29 mg/dL — ABNORMAL HIGH (ref 8–23)
Calcium, Ion: 1.15 mmol/L (ref 1.15–1.40)
Chloride: 111 mmol/L (ref 98–111)
Creatinine, Ser: 0.9 mg/dL (ref 0.61–1.24)
Glucose, Bld: 153 mg/dL — ABNORMAL HIGH (ref 70–99)
HCT: 38 % — ABNORMAL LOW (ref 39.0–52.0)
Hemoglobin: 12.9 g/dL — ABNORMAL LOW (ref 13.0–17.0)
Potassium: 4.3 mmol/L (ref 3.5–5.1)
Sodium: 140 mmol/L (ref 135–145)
TCO2: 22 mmol/L (ref 22–32)

## 2019-12-18 SURGERY — AMPUTATION DIGIT
Anesthesia: Monitor Anesthesia Care | Site: Foot | Laterality: Bilateral

## 2019-12-18 MED ORDER — LIDOCAINE HCL (PF) 2 % IJ SOLN
INTRAMUSCULAR | Status: AC
  Filled 2019-12-18: qty 10

## 2019-12-18 MED ORDER — MEPERIDINE HCL 25 MG/ML IJ SOLN: 6.2500 mg | INTRAMUSCULAR | Status: DC | PRN

## 2019-12-18 MED ORDER — ONDANSETRON HCL 4 MG/2ML IJ SOLN
INTRAMUSCULAR | Status: DC | PRN
  Administered 2019-12-18: 4 mg via INTRAVENOUS

## 2019-12-18 MED ORDER — HEMOSTATIC AGENTS (NO CHARGE) OPTIME
TOPICAL | Status: DC | PRN
  Administered 2019-12-18: 1 via TOPICAL

## 2019-12-18 MED ORDER — PROPOFOL 10 MG/ML IV BOLUS
INTRAVENOUS | Status: AC
  Filled 2019-12-18: qty 20

## 2019-12-18 MED ORDER — LACTATED RINGERS IV SOLN: INTRAVENOUS | Status: DC

## 2019-12-18 MED ORDER — BUPIVACAINE HCL (PF) 0.5 % IJ SOLN
INTRAMUSCULAR | Status: AC
  Filled 2019-12-18: qty 30

## 2019-12-18 MED ORDER — LIDOCAINE HCL 2 % IJ SOLN
INTRAMUSCULAR | Status: AC
  Filled 2019-12-18: qty 20

## 2019-12-18 MED ORDER — ONDANSETRON HCL 4 MG/2ML IJ SOLN: 4.0000 mg | Freq: Once | INTRAMUSCULAR | Status: DC | PRN

## 2019-12-18 MED ORDER — 0.9 % SODIUM CHLORIDE (POUR BTL) OPTIME
TOPICAL | Status: DC | PRN
  Administered 2019-12-18: 13:00:00 1000 mL

## 2019-12-18 MED ORDER — PROPOFOL 500 MG/50ML IV EMUL
INTRAVENOUS | Status: DC | PRN
  Administered 2019-12-18: 75 ug/kg/min via INTRAVENOUS

## 2019-12-18 MED ORDER — PROPOFOL 10 MG/ML IV BOLUS
INTRAVENOUS | Status: DC | PRN
  Administered 2019-12-18: 20 mg via INTRAVENOUS

## 2019-12-18 MED ORDER — FENTANYL CITRATE (PF) 100 MCG/2ML IJ SOLN: 25.0000 ug | INTRAMUSCULAR | Status: DC | PRN

## 2019-12-18 MED ORDER — LIDOCAINE 2% (20 MG/ML) 5 ML SYRINGE
INTRAMUSCULAR | Status: DC | PRN
  Administered 2019-12-18: 50 mg via INTRAVENOUS

## 2019-12-18 MED ORDER — MIDAZOLAM HCL 5 MG/5ML IJ SOLN
INTRAMUSCULAR | Status: DC | PRN
  Administered 2019-12-18: 1 mg via INTRAVENOUS

## 2019-12-18 MED ORDER — BUPIVACAINE HCL (PF) 0.5 % IJ SOLN
INTRAMUSCULAR | Status: DC | PRN
  Administered 2019-12-18: 20 mL

## 2019-12-18 MED ORDER — ONDANSETRON HCL 4 MG/2ML IJ SOLN
INTRAMUSCULAR | Status: AC
  Filled 2019-12-18: qty 2

## 2019-12-18 MED ORDER — CHLORHEXIDINE GLUCONATE 4 % EX LIQD: 60.0000 mL | Freq: Once | CUTANEOUS | Status: DC

## 2019-12-18 MED ORDER — MIDAZOLAM HCL 2 MG/2ML IJ SOLN
INTRAMUSCULAR | Status: AC
  Filled 2019-12-18: qty 2

## 2019-12-18 MED ORDER — FENTANYL CITRATE (PF) 250 MCG/5ML IJ SOLN
INTRAMUSCULAR | Status: AC
  Filled 2019-12-18: qty 5

## 2019-12-18 MED ORDER — CEFAZOLIN SODIUM-DEXTROSE 2-4 GM/100ML-% IV SOLN
2.0000 g | INTRAVENOUS | Status: AC
  Administered 2019-12-18: 2 g via INTRAVENOUS
  Filled 2019-12-18: qty 100

## 2019-12-18 MED ORDER — HYDROCODONE-ACETAMINOPHEN 5-325 MG PO TABS: 1.0000 | ORAL_TABLET | Freq: Four times a day (QID) | ORAL | 0 refills | Status: DC | PRN

## 2019-12-18 MED ORDER — PHENYLEPHRINE 40 MCG/ML (10ML) SYRINGE FOR IV PUSH (FOR BLOOD PRESSURE SUPPORT)
PREFILLED_SYRINGE | INTRAVENOUS | Status: DC | PRN
  Administered 2019-12-18: 80 ug via INTRAVENOUS
  Administered 2019-12-18: 120 ug via INTRAVENOUS

## 2019-12-18 MED ORDER — LIDOCAINE 2% (20 MG/ML) 5 ML SYRINGE
INTRAMUSCULAR | Status: AC
  Filled 2019-12-18: qty 5

## 2019-12-18 MED ORDER — LIDOCAINE HCL 2 % IJ SOLN
INTRAMUSCULAR | Status: DC | PRN
  Administered 2019-12-18: 20 mL

## 2019-12-18 MED ORDER — FENTANYL CITRATE (PF) 100 MCG/2ML IJ SOLN
INTRAMUSCULAR | Status: DC | PRN
  Administered 2019-12-18: 25 ug via INTRAVENOUS

## 2019-12-18 SURGICAL SUPPLY — 60 items
APL PRP STRL LF DISP 70% ISPRP (MISCELLANEOUS)
APL SKNCLS STERI-STRIP NONHPOA (GAUZE/BANDAGES/DRESSINGS) ×1
BENZOIN TINCTURE PRP APPL 2/3 (GAUZE/BANDAGES/DRESSINGS) ×3 IMPLANT
BLADE AVERAGE 25MMX9MM (BLADE)
BLADE AVERAGE 25X9 (BLADE) IMPLANT
BLADE OSCILLATING /SAGITTAL (BLADE) ×3 IMPLANT
BLADE SURG 15 STRL LF DISP TIS (BLADE) IMPLANT
BLADE SURG 15 STRL SS (BLADE)
BNDG ELASTIC 4X5.8 VLCR STR LF (GAUZE/BANDAGES/DRESSINGS) ×6 IMPLANT
BNDG GAUZE ELAST 4 BULKY (GAUZE/BANDAGES/DRESSINGS) ×7 IMPLANT
CHLORAPREP W/TINT 26 (MISCELLANEOUS) IMPLANT
CLOSURE WOUND 1/2 X4 (GAUZE/BANDAGES/DRESSINGS) ×1
CORD BIPOLAR FORCEPS 12FT (ELECTRODE) ×2 IMPLANT
COVER SURGICAL LIGHT HANDLE (MISCELLANEOUS) ×3 IMPLANT
COVER WAND RF STERILE (DRAPES) ×3 IMPLANT
CUFF TOURN SGL QUICK 18X4 (TOURNIQUET CUFF) ×3 IMPLANT
CUFF TOURN SGL QUICK 24 (TOURNIQUET CUFF)
CUFF TRNQT CYL 24X4X16.5-23 (TOURNIQUET CUFF) IMPLANT
DRSG PAD ABDOMINAL 8X10 ST (GAUZE/BANDAGES/DRESSINGS) ×3 IMPLANT
DRSG XEROFORM 1X8 (GAUZE/BANDAGES/DRESSINGS) ×2 IMPLANT
ELECT REM PT RETURN 9FT ADLT (ELECTROSURGICAL)
ELECTRODE REM PT RTRN 9FT ADLT (ELECTROSURGICAL) IMPLANT
GAUZE PACKING IODOFORM 1/4X15 (GAUZE/BANDAGES/DRESSINGS) ×3 IMPLANT
GAUZE SPONGE 4X4 12PLY STRL (GAUZE/BANDAGES/DRESSINGS) ×3 IMPLANT
GAUZE SPONGE 4X4 12PLY STRL LF (GAUZE/BANDAGES/DRESSINGS) ×2 IMPLANT
GAUZE SPONGE 4X4 16PLY XRAY LF (GAUZE/BANDAGES/DRESSINGS) ×3 IMPLANT
GAUZE XEROFORM 1X8 LF (GAUZE/BANDAGES/DRESSINGS) ×3 IMPLANT
GAUZE XEROFORM 5X9 LF (GAUZE/BANDAGES/DRESSINGS) ×2 IMPLANT
GLOVE BIO SURGEON STRL SZ8 (GLOVE) ×3 IMPLANT
GLOVE BIOGEL PI IND STRL 8 (GLOVE) ×1 IMPLANT
GLOVE BIOGEL PI INDICATOR 8 (GLOVE) ×2
GOWN STRL REUS W/ TWL LRG LVL3 (GOWN DISPOSABLE) ×2 IMPLANT
GOWN STRL REUS W/TWL LRG LVL3 (GOWN DISPOSABLE) ×6
HEMOSTAT SURGICEL .5X2 ABSORB (HEMOSTASIS) ×8 IMPLANT
KIT BASIN OR (CUSTOM PROCEDURE TRAY) ×3 IMPLANT
KIT TURNOVER KIT B (KITS) ×3 IMPLANT
NDL HYPO 25GX1X1/2 BEV (NEEDLE) IMPLANT
NDL PRECISIONGLIDE 27X1.5 (NEEDLE) ×1 IMPLANT
NEEDLE HYPO 25GX1X1/2 BEV (NEEDLE) ×3 IMPLANT
NEEDLE PRECISIONGLIDE 27X1.5 (NEEDLE) ×3 IMPLANT
NS IRRIG 1000ML POUR BTL (IV SOLUTION) ×3 IMPLANT
PACK ORTHO EXTREMITY (CUSTOM PROCEDURE TRAY) ×3 IMPLANT
PAD ABD 8X10 STRL (GAUZE/BANDAGES/DRESSINGS) ×4 IMPLANT
PAD ARMBOARD 7.5X6 YLW CONV (MISCELLANEOUS) ×6 IMPLANT
PAD CAST 4YDX4 CTTN HI CHSV (CAST SUPPLIES) IMPLANT
PADDING CAST COTTON 4X4 STRL (CAST SUPPLIES)
SOL PREP POV-IOD 4OZ 10% (MISCELLANEOUS) ×3 IMPLANT
STAPLER VISISTAT 35W (STAPLE) IMPLANT
STRIP CLOSURE SKIN 1/2X4 (GAUZE/BANDAGES/DRESSINGS) ×2 IMPLANT
SUCTION FRAZIER HANDLE 10FR (MISCELLANEOUS) ×3
SUCTION TUBE FRAZIER 10FR DISP (MISCELLANEOUS) ×1 IMPLANT
SUT PROLENE 4 0 P 3 18 (SUTURE) ×6 IMPLANT
SUT PROLENE 4 0 PS 2 18 (SUTURE) IMPLANT
SUT VIC AB 3-0 PS2 18 (SUTURE) IMPLANT
SUT VICRYL 4-0 PS2 18IN ABS (SUTURE) ×3 IMPLANT
SYR CONTROL 10ML LL (SYRINGE) ×6 IMPLANT
TOWEL GREEN STERILE (TOWEL DISPOSABLE) ×3 IMPLANT
TUBE CONNECTING 12'X1/4 (SUCTIONS) ×1
TUBE CONNECTING 12X1/4 (SUCTIONS) ×2 IMPLANT
YANKAUER SUCT BULB TIP NO VENT (SUCTIONS) IMPLANT

## 2019-12-18 NOTE — Op Note (Signed)
OPERATIVE REPORT Patient name: [name] MRN: [id number] DOB: 06/30/1941  DOS:  12/18/2019  Preop Dx: Osteomyelitis second digits bilateral Postop Dx: same  Procedure:  1.  Second toe amputation bilateral  Surgeon: Edrick Kins DPM  Anesthesia: 50-50 mixture of 2% lidocaine plain with 0.5% Marcaine plain totaling 10 mL infiltrated in the patient's second digits of the bilateral lower extremity  Hemostasis: Ankle tourniquet inflated to a pressure of 234mmHg after esmarch exsanguination   EBL: Minimal mL Materials: None Injectables: None Pathology: None  Condition: The patient tolerated the procedure and anesthesia well. No complications noted or reported   Justification for procedure: The patient is a 79 y.o. 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 All conservative modalities of been unsuccessful in providing any sort of satisfactory alleviation of symptoms with the patient. The patient was told benefits as well as possible side effects of the surgery. The patient consented for surgical correction. The patient consent form was reviewed. All patient questions were answered. No guarantees were expressed or implied. The patient and the surgeon boson the patient consent form with the witness present and placed in the patient's chart.   Procedure in Detail: The patient was brought to the operating room, placed in the operating table in the supine position at which time an aseptic scrub and drape were performed about the patient's respective lower extremity after anesthesia was induced as described above. Attention was then directed to the surgical area where procedure number one commenced.  Procedure #1: Second toe amputation left foot A racquet type incision was planned and made about the second MTPJ of the left foot.  The toe was grasped with a perforating towel clamp and distracted  distally and the toe was disarticulated at the MTPJ using a surgical #15 blade.  Irrigation was utilized and primary closure was obtained using 4-0 Prolene suture.  Prior to primary closure Surgicel was placed within the deep tissues of the amputation stump since the patient is on chronic Coumadin.  Procedure #2: Second toe amputation right foot A racquet type incision was planned and made about the second MTPJ of the right foot.  The toe was grasped with a perforating towel clamp and distracted distally and the toe was disarticulated at the MTPJ using a surgical #15 blade.  Irrigation was utilized and primary closure was obtained using 4-0 Prolene suture.  Prior to primary closure Surgicel was placed within the deep tissues of the amputation stump since the patient is on chronic Coumadin.  Dry sterile compressive dressings were then applied to all previously mentioned incision sites about the patient's lower extremity. The tourniquet which was used for hemostasis was deflated. All normal neurovascular responses including pink color and warmth returned all the digits of patient's lower extremity.  The patient was then transferred from the operating room to the recovery room having tolerated the procedure and anesthesia well. All vital signs are stable. After a brief stay in the recovery room the patient was discharged.  The patient is already currently on pain medication.  Verbal as well as written instructions were provided for the patient regarding wound care. The patient is to keep the dressings clean dry and intact until they are to follow surgeon Dr. Daylene Katayama in the office upon discharge.   Edrick Kins, DPM [facility]  Dr. Edrick Kins, DPM    [address]  [place], [place] [address]                Office [phone]  Fax [phone]   

## 2019-12-18 NOTE — Anesthesia Procedure Notes (Signed)
Procedure Name: MAC Date/Time: 12/18/2019 12:50 PM Performed by: Jenne Campus, CRNA Pre-anesthesia Checklist: Patient identified, Emergency Drugs available, Suction available and Patient being monitored Oxygen Delivery Method: Simple face mask

## 2019-12-18 NOTE — Progress Notes (Signed)
Orthopedic Tech Progress Note Patient Details:  [name] [birth year] [id number] PACU RN called requesting BILATERAL POST OP SHOE for patient Ortho Devices Type of Ortho Device: Postop shoe/boot Ortho Device/Splint Location: BILATERAL FEET Ortho Device/Splint Interventions: Application   Post Interventions Patient Tolerated: Well Instructions Provided: Care of device, Adjustment of device   [name] 12/18/2019, 2:45 PM

## 2019-12-18 NOTE — Anesthesia Preprocedure Evaluation (Signed)
Anesthesia Evaluation  Patient identified by MRN, date of birth, ID band Patient awake    Reviewed: Allergy & Precautions, NPO status , Patient's Chart, lab work & pertinent test results, reviewed documented beta blocker date and time   Airway Mallampati: II  TM Distance: >3 FB Neck ROM: Full    Dental  (+) Dental Advisory Given   Pulmonary neg pulmonary ROS,    Pulmonary exam normal breath sounds clear to auscultation       Cardiovascular hypertension, Pt. on home beta blockers and Pt. on medications +CHF  Normal cardiovascular exam Rhythm:Regular Rate:Normal  Echo 1. Left ventricular ejection fraction, by visual estimation, is 20 to 25%. The left ventricle has severely decreased function. There is mildly increased left ventricular hypertrophy.  2. Abnormal septal motion consistent with left bundle branch block.  3. Left ventricular diastolic parameters are indeterminate.  4. Mildly dilated left ventricular internal cavity size.  5. The left ventricle demonstrates global hypokinesis.  6. Global right ventricle has normal systolic function.The right ventricular size is normal. No increase in right ventricular wall thickness.  7. Left atrial size was severely dilated.  8. Right atrial size was severely dilated.  9. Mild mitral annular calcification.  10. The mitral valve is grossly normal. Mild mitral valve regurgitation.  11. The tricuspid valve is grossly normal. Tricuspid valve regurgitation moderate.  12. The aortic valve is tricuspid and moderately calcificed with decreased cusp excursion. Gradients are not significantly increased, but in the setting of reduced LVEF, difficult to exclude low gradient aortic stenosis. Aortic valve regurgitation is not visualized.  13. The pulmonic valve was grossly normal. Pulmonic valve regurgitation is trivial.  14. Moderately elevated pulmonary artery systolic pressure.  15. The  tricuspid regurgitant velocity is 3.23 m/s, and with an assumed right atrial pressure of 15 mmHg, the estimated right ventricular systolic pressure is moderately elevated at 56.7 mmHg.    Neuro/Psych negative neurological ROS  negative psych ROS   GI/Hepatic negative GI ROS, Neg liver ROS,   Endo/Other  negative endocrine ROSdiabetes  Renal/GU negative Renal ROS  negative genitourinary   Musculoskeletal negative musculoskeletal ROS (+)   Abdominal   Peds negative pediatric ROS (+)  Hematology negative hematology ROS (+)   Anesthesia Other Findings   Reproductive/Obstetrics negative OB ROS                            Anesthesia Physical Anesthesia Plan  ASA: IV  Anesthesia Plan: MAC   Post-op Pain Management:    Induction: Intravenous  PONV Risk Score and Plan: 2 and Ondansetron and Treatment may vary due to age or medical condition  Airway Management Planned: Natural Airway  Additional Equipment:   Intra-op Plan:   Post-operative Plan:   Informed Consent: I have reviewed the patients History and Physical, chart, labs and discussed the procedure including the risks, benefits and alternatives for the proposed anesthesia with the patient or authorized representative who has indicated his/her understanding and acceptance.     Dental advisory given  Plan Discussed with: CRNA  Anesthesia Plan Comments:        Anesthesia Quick Evaluation

## 2019-12-18 NOTE — Transfer of Care (Signed)
Immediate Anesthesia Transfer of Care Note  Patient: [name]  Procedure(s) Performed: BILATERAL 2ND TOE AMPUTATION DIGIT (Bilateral Foot)  Patient Location: PACU  Anesthesia Type:MAC  Level of Consciousness: awake, oriented and patient cooperative  Airway & Oxygen Therapy: Patient Spontanous Breathing and Patient connected to face mask oxygen  Post-op Assessment: Report given to RN and Post -op Vital signs reviewed and stable  Post vital signs: Reviewed  Last Vitals:  Vitals Value Taken Time  BP    Temp    Pulse    Resp    SpO2      Last Pain:  Vitals:   12/18/19 1119  TempSrc:   PainSc: 0-No pain      Patients Stated Pain Goal: 3 (35/57/32 2025)  Complications: No apparent anesthesia complications

## 2019-12-18 NOTE — Interval H&P Note (Signed)
History and Physical Interval Note:  12/18/2019 12:21 PM  [name]  has presented today for surgery, with the diagnosis of OSTEOMYLITIS.  The various methods of treatment have been discussed with the patient and family. After consideration of risks, benefits and other options for treatment, the patient has consented to  Procedure(s): BILATERAL 2ND TOE AMPUTATION DIGIT (Bilateral) as a surgical intervention.  The patient's history has been reviewed, patient examined, no change in status, stable for surgery.  I have reviewed the patient's chart and labs.  Questions were answered to the patient's satisfaction.     Edrick Kins

## 2019-12-18 NOTE — Brief Op Note (Signed)
12/18/2019  1:59 PM  PATIENT:  [name]  79 y.o. male  PRE-OPERATIVE DIAGNOSIS:  OSTEOMYLITIS  POST-OPERATIVE DIAGNOSIS:  OSTEOMYLITIS  PROCEDURE:  Procedure(s): BILATERAL 2ND TOE AMPUTATION DIGIT (Bilateral)  SURGEON:  Surgeon(s) and Role:    Edrick Kins, DPM - Primary  PHYSICIAN ASSISTANT:   ASSISTANTS: none   ANESTHESIA:   local and MAC  EBL:  minimal   BLOOD ADMINISTERED:none  DRAINS: none   LOCAL MEDICATIONS USED:  MARCAINE    and LIDOCAINE   SPECIMEN:  No Specimen  DISPOSITION OF SPECIMEN:  N/A  COUNTS:  YES  TOURNIQUET:   Total Tourniquet Time Documented: Calf (Right) - 14 minutes Total: Calf (Right) - 14 minutes  Calf (Left) - 12 minutes Total: Calf (Left) - 12 minutes   DICTATION: .Viviann Spare Dictation  PLAN OF CARE: Discharge to home after PACU  PATIENT DISPOSITION:  PACU - hemodynamically stable.   Delay start of Pharmacological VTE agent (>24hrs) due to surgical blood loss or risk of bleeding: not applicable  Edrick Kins, DPM [facility]  Dr. Edrick Kins, DPM    [address], [place] [address]                Office [phone]  Fax [phone]

## 2019-12-19 ENCOUNTER — Encounter: Payer: Self-pay | Admitting: *Deleted

## 2019-12-19 ENCOUNTER — Encounter (HOSPITAL_BASED_OUTPATIENT_CLINIC_OR_DEPARTMENT_OTHER): Payer: Medicare HMO | Admitting: Internal Medicine

## 2019-12-19 DIAGNOSIS — I5022 Chronic systolic (congestive) heart failure: Secondary | ICD-10-CM | POA: Diagnosis not present

## 2019-12-19 DIAGNOSIS — L97524 Non-pressure chronic ulcer of other part of left foot with necrosis of bone: Secondary | ICD-10-CM | POA: Diagnosis not present

## 2019-12-19 DIAGNOSIS — E1142 Type 2 diabetes mellitus with diabetic polyneuropathy: Secondary | ICD-10-CM | POA: Diagnosis not present

## 2019-12-19 DIAGNOSIS — L97511 Non-pressure chronic ulcer of other part of right foot limited to breakdown of skin: Secondary | ICD-10-CM | POA: Diagnosis not present

## 2019-12-19 DIAGNOSIS — M86672 Other chronic osteomyelitis, left ankle and foot: Secondary | ICD-10-CM | POA: Diagnosis not present

## 2019-12-19 DIAGNOSIS — E1151 Type 2 diabetes mellitus with diabetic peripheral angiopathy without gangrene: Secondary | ICD-10-CM | POA: Diagnosis not present

## 2019-12-19 DIAGNOSIS — I87333 Chronic venous hypertension (idiopathic) with ulcer and inflammation of bilateral lower extremity: Secondary | ICD-10-CM | POA: Diagnosis not present

## 2019-12-19 DIAGNOSIS — L97221 Non-pressure chronic ulcer of left calf limited to breakdown of skin: Secondary | ICD-10-CM | POA: Diagnosis not present

## 2019-12-19 DIAGNOSIS — L97211 Non-pressure chronic ulcer of right calf limited to breakdown of skin: Secondary | ICD-10-CM | POA: Diagnosis not present

## 2019-12-19 NOTE — Progress Notes (Signed)
[name], [name] ([id number]) Visit Report for 12/19/2019 HPI Details Patient Name: Date of Service: Daniel, Gross 12/19/2019 2:00 PM Medical Record [place] Patient Account Number: [id number] Date of Birth/Sex: Treating RN: [birth year] (79 y.o. [name]) [name] Primary Care Provider: Jeralyn Gross Other Clinician: Referring Provider: Treating Provider/Extender:[name], [name] in Treatment: 52 History of Present Illness Location: Patient presents with a wound to left lower leg. Quality: Patient reports No Pain. Duration: 2 months HPI Description: no cig or alcohol. spontaneous appearance in area of stasis dermamtitis. [name] on metformin only. chronic afib on Coumadin. diabetes and coag studies not good. hba1c 7.5. ivr 4.5. no pain or sxs of systemic disease. hx chf. no intermittent claudication 78/10/2018 Readmission This is a now a 79 year old man who was previously cared for in 2016 by [name] for wounds on his lower extremities. At that point he had venous reflux studies although I cannot seem to open these in [place] [facility]. He had arterial studies showing an ABI of 1.11 on the right and 1.27 on the left his waveforms were triphasic bilaterally. He was discharged in stockings although I do not believe he is wearing these in some time. He tells me that about a month ago he noted openings of a large wound on the posterior right calf and 2 smaller areas on the left lateral calf and a small area more recently on the left posterior calf. He has been dressing these with peroxide and triple antibiotic ointment. He is not wearing compression. Past medical history; type 2 diabetes with peripheral neuropathy, chronic venous insufficiency, hypertension, cardiomyopathy, chronic atrial fibrillation on Coumadin, prostate cancer, hyperlipidemia, gout, ABI in our clinic was 1.34 on the left and not obtainable on the right 6/9; this is a patient who has chronic venous  insufficiency. He has a fairly substantial area on the right posterior calf, left lateral calf and a small area on the left posterior calf. On arrival last week he had very palpable popliteal and femoral pulses but nothing in his bilateral feet. Unfortunately we cannot get arterial studies until July 1 at [name] office. They live in [place]. We use silver alginate under Kerlix Coban 6/16; patient with chronic venous insufficiency with wounds on his bilateral lower extremities. When he came into our clinic he was discovered to have a complete absence of peripheral pedal pulses at either the dorsalis pedis or posterior tibial. He does have easily palpable femoral and popliteal pulses. He sees Dr. Gwenlyn Gross tomorrow for noninvasive arterial tests. He may also require venous reflux evaluation although I do not view this as an urgent thing. We have been using silver alginate. His wound surfaces of cleaned up quite nicely 6/23; patient with chronic venous insufficiency with wounds on his bilateral lower extremities. His wounds all are somewhat better looking. He did go to [name] office but somehow ended up on the doctors schedule rather than being scheduled for noninvasive tests therefore his noninvasive tests are scheduled for July 1. We agree that he has venous insufficiency ulcers but I cannot feel any pulses in his lower extremities dictating the need for test. We are only using Kerlix and light Coban unfortunately this appears to be holding the edema 6/30; has his arterial studies tomorrow. We have been using Kerlix and light Coban will go to a more aggressive compression if the arterial studies will allow. We all agreed these are venous wounds however I cannot feel pulses at either the dorsalis pedis or posterior tibial  bilaterally. His wounds generally look some better including left lateral and right posterior. 7/7-Patient returns at 1 week in Kerlix/Coban to both legs, with improvement,  in the left lateral and right posterior lower leg wounds, ABI's are normal in both legs per vascular studies, TBI is also normal on both sides, we are using hydrofera blue to the wounds 7/14; patient's arterial studies from 2 Gross ago showed an ABI on the right at 1.03 with a TBI of 0.86. On the left the ABI was 1.06 with a TBI of 0.84. Notable for the fact that his arterial waveforms were monophasic in all of the lower extremity arteries suggesting some degree of arterial occlusive disease but in general this was felt to be fairly adequate for healing. His compression was increased from 2-3 layer which is appropriate. Dressing was changed to Hydrofera Blue 7/21; patient's wounds are measuring smaller. The more substantial one on the right posterior calf, second 1 on the left lateral calf. Using Hydrofera Blue on both wound areas 7/28; patient continues to make nice improvements. The area on the right posterior calf is smaller. Area on the left lateral calf also is smaller. We have been using Hydrofera Blue under compression. The patient will need compression stockings and we have measured him for these in the eventuality that these heal which really should not be too long from now 8/4-Patient continues to make improvement, the right posterior calf area smaller with rim of keratotic skin on one side, the left wound is definitely smaller and improving. 8/11-Returns at 1 week, after being in 3 layer compression on both legs, both wounds appear to be improving, making good progress, patient is happy, pain is also less especially in the right leg wound 8/18; the area on the left anterior lower leg is healed. On the right posterior leg the wound remains although the dimensions are a lot better. 8/25; he arrives in clinic today with a large body of open wound on the left lateral calf. All of the 3 wounds in this area are in close juxtaposition to each other. The story is that we discharged him last  week with no a wrap on the left leg. They went to [place] could not get in as they are only excepting phone orders or online orders for stockings hence they did not put any stocking on the left leg all week. They have something at home but the patient with that was either incapable or just did not put them on. Apparently these opened 1 morning after getting out of bed. The area on the right has no real change 9/1; patient has bilateral lower extremity wounds in the setting of severe chronic venous insufficiency and secondary lymphedema. He arrived last week with new areas on the left lateral lower leg after we did not wrap him and he did not use his stockings. Nevertheless the areas on the left look better today under compression. Posterior right calf does not really changed. We are using Hydrofera Blue on both areas under compression 9/15; bilateral lower extremity wounds in the setting of severe chronic venous insufficiency and secondary lymphedema. He has 20 to 30 mmHg below-knee compression stockings under the eventuality that these close over. We did get the left leg to close but he did not transition to a stocking and this reopened. There are 2 open areas on the left posterior lateral calf and one on the right. Both of these look satisfactory. Using Hydrofera Blue 9/22; bilateral lower extremity wounds in the   setting of chronic venous insufficiency. 2 superficial areas on the left lateral calf. One on the right just above the Achilles area. We have good edema control we have been using Hydrofera Blue 9/29; the areas on the left lateral calf are healed. On the right just above the Achilles and tendon area things look a lot better small wounds one scabbed area. We have been using Hydrofera Blue. We can discharge him in his own stocking on the left still wrapping on the right. This is the second time we have healed the left leg but he did not put a stocking on last time. Hopefully this will  maintain the edema from chronic venous disease with secondary lymphedema 10/6; he comes in today having a stocking on the left leg. They had trouble getting it on there is a lot of increase in swelling 2 small open areas one anteriorly and one on the medial calf. They report a lot of difficulty getting the stocking on. Paradoxically the area on the right that we have been wrapping posteriorly is closed 10/13; he comes in today with wounds bilaterally including superficial areas on the left medial and left lateral calf. As well as the right posterior has reopened in the Achilles area superiorly. He still does not have his juxta lite stockings although truthfully we would not of been able to use them today anyway. Apparently have been ordered and paid for from [employer] although they have not been delivered 10/20; his area on the right is just the boat closed on the right posterior. Still has the area on the left lateral and a very tiny area on the left medial. He has his bilateral juxta lites although he is not ready for them this week. He tolerated the increase to 4 layer compression last week quite well 10/27; the area on the right posterior calf is once again closed. He has a superficial area on the left lateral calf that is still open. He has been using Hydrofera Blue and bilateral 4-layer compression. He can change to his own juxta lite stocking on the right and we are instructing him today 11/3; the area on the right posterior calf reopened according to the patient and his wife after they took off the stocking when they got home last week. Apparently scabbed over there is now a fairly substantial wound which looks pretty much the same. Our intake nurse noted that they were using the juxta lite stockings appropriately. I was really hoping I might be able to close him out today. He has 1 very tiny remaining area on the left lateral lower leg. 11/10; right posterior calf wound measures smaller but  is still open. We have been using Hydrofera Blue. On the left he has a small oval-shaped wound and he seems to have had another wound distally that is open and likely a blister. We are using Hydrofera Blue under compression 11/17; right posterior calf wound continues to get better. We have been using Hydrofera Blue. On the left lateral one of the wounds has closed still a small open area. We have been using Hydrofera Blue on this as well. Both areas have been under 4-layer compression Arrives in clinic today with some swelling in the dorsal foot on the right some erythema of his forefoot and toes. Initially when I looked at this I almost thought this was a sunburn distal to a wrap injury. 12/1; right posterior calf wound debrided with a curette. We have been using Hydrofera Blue on the   left anterior lateral he has an area across the mid tibia. Finally a small area on the left lateral lower calf. Finally he continues to have de-epithelialized areas on the dorsal aspect of his toes. Initially thought this might be a burn injury when I saw him 2 Gross ago. I now wonder about tinea. I have also reviewed his arterial studies which were really quite good in July/20 with normal TBI's and ABIs but monophasic waveforms 12/8; comes in today with worsening problems especially on the left leg where he now has a cluster of wounds in the left anterior mid tibia. Very poor edema control. I reduced him to 3 layer from 4 layer compression last week because of some concern about blood flow to his toes however he does not have good edema control on the left leg. Right leg edema control looks satisfactory. On the left he has a cluster of wounds anteriorly, small area on the left medial fifth met head and then the collection of areas on his toes which appear better On the right he has the original area on the right posterior calf, a new area right medially. His formal arterial studies from mid July are noted below.  He was evaluated by [facility] ABI Findings: +---------+------------------+-----+----------+--------+ Right Rt Pressure (mmHg)IndexWaveform Comment  +---------+------------------+-----+----------+--------+ Brachial 176     +---------+------------------+-----+----------+--------+ ATA 176 0.99 monophasic  +---------+------------------+-----+----------+--------+ PTA 183 1.03 monophasic  +---------+------------------+-----+----------+--------+ PERO 172 0.97 monophasic  +---------+------------------+-----+----------+--------+ Great Toe153 0.86    +---------+------------------+-----+----------+--------+ +---------+------------------+-----+-----------+-------+ Left Lt Pressure (mmHg)IndexWaveform Comment +---------+------------------+-----+-----------+-------+ Brachial 178     +---------+------------------+-----+-----------+-------+ ATA 162 0.91 multiphasic  +---------+------------------+-----+-----------+-------+ PTA 188 1.06 multiphasic  +---------+------------------+-----+-----------+-------+ PERO 158 0.89 monophasic   +---------+------------------+-----+-----------+-------+ Great Toe150 0.84    +---------+------------------+-----+-----------+-------+ +-------+-----------+-----------+------------+------------+ ABI/TBIToday's ABIToday's TBIPrevious ABIPrevious TBI +-------+-----------+-----------+------------+------------+ Right 1.03 0.86 1.11   +-------+-----------+-----------+------------+------------+ Left 1.06 0.84 1.27   +-------+-----------+-----------+------------+------------+ Tibial waveforms somewhat difficult to record due to irregular heartbeat. Bilateral ABIs appear essentially unchanged compared to prior study on 06/21/15. Summary: Right: Resting right ankle-brachial index is within normal range. No evidence of significant right lower extremity arterial disease. The right  toe-brachial index is normal. Although ankle brachial indices are within normal limits (0.95-1.29), arterial Doppler waveforms at the ankle suggest some component of arterial occlusive disease. Left: Resting left ankle-brachial index is within normal range. No evidence of significant left lower extremity arterial disease. The left toe-brachial index is normal. Although ankle brachial indices are within normal limits (0.95-1.29), arterial Doppler waveforms at the ankle suggest some component of arterial occlusive disease. 12/15; the patient's area on the left mid tibia looks better. Right posterior calf also better. He has the area on the left foot as well. All of his toes look better I think this was tinea. We are using Hydrofera Blue everywhere else The patient was in urgent care yesterday with wheezing and shortness of breath. He got azithromycin and prednisone. He feels better. His lungs are currently clear to auscultation. He was not tested for Covid 19 12/29; the patient arrives in clinic today with quite a bit change. 2 Gross ago he only had areas on the left mid tibia right posterior calf with tinea pedis resolving between his toes. He arrives in clinic today with several areas on the dorsal toes on the right, dorsal left second toe. He has skin breakdown in the left medial calf probably from excess edema. Small area proximally in the medial calf. He has weeping edema fluid coming out of the skin excoriations on the left medial calf. He tells  me that he is having a cardiac catheterization next week. I had a quick look at [place] link. He was Gross to have an ejection fraction of 25% during the work-up for persistent atrial fibrillation. He saw his cardiology office yesterday seen by the nurse practitioner. She increased his carvedilol. He has not been on diuretics for apparently several months and indeed in the nurse practitioner Laura Ingolds notes she had knowledge of this. 10/04/2019  on evaluation today patient presents as a walk-in visit concerning the fact that he did not have an appointment here for our clinic at this point. He actually had a cardiac catheterization earlier today and then came from there to here in order to be evaluated. With that being said unfortunately he is having significant cellulitis of his left lower extremity upon evaluation today this appears to have deteriorated even since last week's evaluation with Dr. Robson. The right lower extremity is actually doing okay I really see no evidence of deterioration at this point at those locations. In fact the right leg seems in general be doing quite well. Nonetheless I am concerned about infection and cellulitis of the left lower extremity and again considering his weakened heart I do not want him to develop into sepsis at all. He is also having some trouble breathing today and I understand according to nursing staff this is always the case to some degree. With that being said the patient unfortunately seems to be in my opinion a little bit worse even his wife feels like that may be the case today. Unsure exactly what is leading to this. Cardiac catheterization I did review the report which showed an ejection fraction of 25% he also had an LAD blockage of around 25% based on what I saw. With that being said there was no significant blockages that required stenting at this point he does have weakened cardiac muscles compared to normal. 10/05/2019; patient was seen yesterday in clinic. He was sent to the ER because of cellulitis of the left leg possibility. In the ER he was given 1 dose of IV Levaquin and discharged on Keflex. He came in the clinic initially for a nurse visit to rewrap his left leg. We did not look at the right leg today that is an Unna boot. The patient also had a cardiac cath. According to him there were no blockages but a very low ejection fraction. 1/12; back for an early follow-up. The  condition of the left lower leg is a lot better although there are multiple open areas. All of them with not a particularly viable surface. On the right posterior calf he has a single wound with a clean surface. He has a wound with exposed bone on the PIP of the left second toe dorsally. He has wounds on the dorsal right first second and third toes. His arterial studies were normal. 1/19; the patient has 3 open wounds on the left leg anteriorly in the mid tibia, distally and medially just above the ankle and posteriorly. On the right he has a small area dime sized on the right posterior calf. His edema control is a lot better. He still has wounds on the right second and left second toes. The left second toe has exposed bone. X- ray I did last week did not show evidence of osteomyelitis in the left foot. 1/26; patient with a multiplicity of wounds and problems. On the left he has a circular area on the left anterior mid tibia Left just above   the medical ankle -left 2nd toe pip -right 2nd toe right posterior calf 2/2; patient with a multiplicity of wounds and problems. He has severe chronic venous insufficiency and the wounds on his leg are all on the left left anterior left medial at the medial malleolus and left posterior. We have been using Iodoflex to these areas to help with ongoing debridement. He also has largely traumatic wounds on his toes this includes the left second with exposed bone. Bone culture I did last week showed Staph aureus which is methicillin sensitive. I discussed with him today the idea of an amputation of this toe because it is literally nonfunctional however he wants to try antibiotics. Antibiotic choice is complicated by the fact that he is on Coumadin. He also has wounds over the dorsal part of the right first which is close to closed. Right second toe has exposed bone and the right third toe at the base of the right third toe is just about closed as well. We have  been using silver alginate 2/9; patient with severe chronic venous insufficiency. He has large wounds on the left anterior mid tibia and on the left medial just above the ankle. I am concerned today about the depth of the area on the left mid tibia may be exposing into the muscle. He has small areas on the left posterior calf and on the right posterior calf although these do not look as threatening. He also has traumatic wounds on his toes. The right first and third are closed however the bilateral second toes have exposed bone. The area on the left is open into the distal interphalangeal joint. In my opinion these toes are nonsalvageable and will need amputation 2/16; patient with severe chronic venous insufficiency. He has wounds on the left anterior mid tibia left medial lower leg just above the ankle. He has small areas on the left posterior calf and a more prominent area on the right posterior calf. All of these are related to poorly controlled venous hypertension. He also had traumatic wounds on the PIPs of both second toes. Unfortunately both of these have exposed bone and in the case of the left this I think goes right into the joint itself. I do not think either 1 of these is salvageable. I have reviewed his arterial evaluation that was done in July last year. He also saw Dr. Berry in June. He felt these were venous stasis. Previously had segmental arterial pressures performed in 11/03/2014 which were normal. He was sent for for a follow-up arterial evaluation on July 1. On the right his ABIs were 1.03 with a TBI of 0.86 although his waveforms were monophasic on the left he had multiphasic waveforms at the ATA PTA but monophasic at the peroneal nevertheless his ABI was normal at 1.06 TBI also normal at 0.84. I think he has enough blood flow to heal an amputation which I think he needs of the left second toe and probably the right second toe as well 2/23; the patient is going for his  bilateral second toe amputation by Dr. Evans on Friday. In the meantime is 3 areas on the left leg and the small area on the right posterior look better. We have been using silver collagen. 3/2; the patient's toe amputations were canceled because of cardiac concerns. Apparently this surgery will need to be done in the hospital although they do not have an appointment. In the meantime he is continues to have 3 areas on the left leg   one anteriorly and 2 smaller areas posteriorly on the left as well as the small posterior area on the right. We have been using silver collagen with compression 3/9; still no word on the second toe amputations bilaterally. 3 wounds on the left leg all look better and the area on the right posterior we have been using silver collagen I change this to [facility] 3/16; amputations next Monday both second toes he is apparently going to be hospitalized. He has on his left leg including left anterior and left lateral look better very superficial also the right posterior. 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 He continues to have left leg wounds anteriorly x2 posteriorly x1 at the left lower leg just above the ankle anteriorly x1 and posteriorly on the right x1 Electronic Signature(s) Signed: 12/19/2019 5:53:09 PM By: [name] MD Entered By: [name] on 12/19/2019 15:57:45 -------------------------------------------------------------------------------- Physical Exam Details Patient Name: Date of Service: Daniel Pia D. 12/19/2019 2:00 PM Medical Record [id number] Patient Account Number: [id number] Date of Birth/Sex: Treating RN: Feb 01, 1941 (79 y.o. [name]) [name] Primary Care Provider: Jeralyn Gross Other Clinician: Referring Provider: Treating Provider/Extender:[name], Hollister Gross in Treatment: 49 Constitutional Sitting  or standing Blood Pressure is within target range for patient.. Pulse regular and within target range for patient.Marland Kitchen Respirations regular, non-labored and within target range.. Temperature is normal and within the target range for the patient.Marland Kitchen Appears in no distress. Notes Wound exam; all his wounds look about the same to me. Really not making any progress we have good edema control. His pedal pulses are palpable. We did not disturb the surgical dressings Electronic Signature(s) Signed: 12/19/2019 5:53:09 PM By: [name] MD Entered By: [name] on 12/19/2019 15:58:59 -------------------------------------------------------------------------------- Physician Orders Details Patient Name: Date of Service: Daniel Pia D. 12/19/2019 2:00 PM Medical Record [id number] Patient Account Number: [id number] Date of Birth/Sex: Treating RN: 11/11/1940 (79 y.o. [name]) [name] Primary Care Provider: Jeralyn Gross Other Clinician: Referring Provider: Treating Provider/Extender:[name], Valle Gross in Treatment: 70 Verbal / Phone Orders: No Diagnosis Coding ICD-10 Coding Code Description L97.211 Non-pressure chronic ulcer of right calf limited to breakdown of skin L97.221 Non-pressure chronic ulcer of left calf limited to breakdown of skin I87.333 Chronic venous hypertension (idiopathic) with ulcer and inflammation of bilateral lower extremity E11.51 Type 2 diabetes mellitus with diabetic peripheral angiopathy without gangrene E11.42 Type 2 diabetes mellitus with diabetic polyneuropathy L97.524 Non-pressure chronic ulcer of other part of left foot with necrosis of bone L97.511 Non-pressure chronic ulcer of other part of right foot limited to breakdown of skin B33.83 Chronic systolic (congestive) heart failure A91.916 Other chronic osteomyelitis, left ankle and foot Follow-up Appointments Return Appointment in 1 week. - Tuesday **********EXTRA  TIME**************** Dressing Change Frequency Wound #19 Left,Anterior Lower Leg Do not change entire dressing for one week. Wound #24 Left,Distal,Anterior Lower Leg Do not change entire dressing for one week. Wound #25 Left,Distal,Posterior Lower Leg Do not change entire dressing for one week. Wound #3R Right,Posterior Calf Do not change entire dressing for one week. Skin Barriers/Peri-Wound Care TCA Cream or Ointment Wound Cleansing May shower with protection. Primary Wound Dressing Wound #19 Left,Anterior Lower Leg Hydrofera Blue Wound #24 Left,Distal,Anterior Lower Leg Hydrofera Blue Wound #25 Left,Distal,Posterior Lower Leg Hydrofera Blue Wound #3R Right,Posterior Calf Hydrofera Blue Secondary Dressing Dry Gauze - secure toes with tape Edema Control Wound #19  Left,Anterior Lower Leg Kerlix and Coban - Bilateral Wound #24 Left,Distal,Anterior Lower Leg Kerlix and Coban - Bilateral Wound #25 Left,Distal,Posterior Lower Leg Kerlix and Coban - Bilateral Wound #3R Right,Posterior Calf Kerlix and Coban - Bilateral Off-Loading Open toe surgical shoe to: - left and right foot Electronic Signature(s) Signed: 12/19/2019 5:53:09 PM By: [name] MD Signed: 12/19/2019 5:59:23 PM By: Daniel Coria RN Entered By: [name] on 12/19/2019 15:53:38 -------------------------------------------------------------------------------- Problem List Details Patient Name: Date of Service: Daniel Pia D. 12/19/2019 2:00 PM Medical Record [id number] Patient Account Number: [id number] Date of Birth/Sex: Treating RN: 1941/07/18 (79 y.o. [name]) Dolores Lory, [place] Primary Care Provider: Jeralyn Gross Other Clinician: Referring Provider: Treating Provider/Extender:[name], Valdez Gross in Treatment: 59 Active Problems ICD-10 Evaluated Encounter Code Description Active Date Today Diagnosis L97.211 Non-pressure chronic ulcer of right calf limited to 02/28/2019 No  Yes breakdown of skin L97.221 Non-pressure chronic ulcer of left calf limited to 02/28/2019 No Yes breakdown of skin I87.333 Chronic venous hypertension (idiopathic) with ulcer 02/28/2019 No Yes and inflammation of bilateral lower extremity E11.51 Type 2 diabetes mellitus with diabetic peripheral 02/28/2019 No Yes angiopathy without gangrene E11.42 Type 2 diabetes mellitus with diabetic polyneuropathy 02/28/2019 No Yes L97.524 Non-pressure chronic ulcer of other part of left foot 10/10/2019 No Yes with necrosis of bone L97.511 Non-pressure chronic ulcer of other part of right foot 09/26/2019 No Yes limited to breakdown of skin C58.85 Chronic systolic (congestive) heart failure 10/05/2019 No Yes M86.672 Other chronic osteomyelitis, left ankle and foot 10/31/2019 No Yes Inactive Problems ICD-10 Code Description Active Date Inactive Date B35.3 Tinea pedis 09/05/2019 09/05/2019 L97.521 Non-pressure chronic ulcer of other part of left foot limited to 09/26/2019 09/26/2019 breakdown of skin Resolved Problems Electronic Signature(s) Signed: 12/19/2019 5:53:09 PM By: [name] MD Entered By: [name] on 12/19/2019 15:56:15 -------------------------------------------------------------------------------- Progress Note Details Patient Name: Date of Service: Daniel Pia D. 12/19/2019 2:00 PM Medical Record [id number] Patient Account Number: [id number] Date of Birth/Sex: Treating RN: 06/19/1941 (79 y.o. [name]) [name] Primary Care Provider: Jeralyn Gross Other Clinician: Referring Provider: Treating Provider/Extender:[name], Franklin Gross in Treatment: 42 Subjective History of Present Illness (HPI) The following HPI elements were documented for the patient's wound: Location: Patient presents with a wound to left lower leg. Quality: Patient reports No Pain. Duration: 2 months no cig or alcohol. spontaneous appearance in area of stasis dermamtitis. [name] on metformin  only. chronic afib on Coumadin. diabetes and coag studies not good. hba1c 7.5. ivr 4.5. no pain or sxs of systemic disease. hx chf. no intermittent claudication 78/10/2018 Readmission This is a now a 79 year old man who was previously cared for in 2016 by [name] for wounds on his lower extremities. At that point he had venous reflux studies although I cannot seem to open these in [place] link. He had arterial studies showing an ABI of 1.11 on the right and 1.27 on the left his waveforms were triphasic bilaterally. He was discharged in stockings although I do not believe he is wearing these in some time. He tells me that about a month ago he noted openings of a large wound on the posterior right calf and 2 smaller areas on the left lateral calf and a small area more recently on the left posterior calf. He has been dressing these with peroxide and triple antibiotic ointment. He is not wearing compression. Past medical history; type 2 diabetes with peripheral neuropathy, chronic venous insufficiency, hypertension, cardiomyopathy, chronic atrial fibrillation on Coumadin, prostate cancer, hyperlipidemia,  gout, ABI in our clinic was 1.34 on the left and not obtainable on the right 6/9; this is a patient who has chronic venous insufficiency. He has a fairly substantial area on the right posterior calf, left lateral calf and a small area on the left posterior calf. On arrival last week he had very palpable popliteal and femoral pulses but nothing in his bilateral feet. Unfortunately we cannot get arterial studies until July 1 at [name] office. They live in [place]. We use silver alginate under Kerlix Coban 6/16; patient with chronic venous insufficiency with wounds on his bilateral lower extremities. When he came into our clinic he was discovered to have a complete absence of peripheral pedal pulses at either the dorsalis pedis or posterior tibial. He does have easily palpable femoral and  popliteal pulses. He sees Dr. Gwenlyn Gross tomorrow for noninvasive arterial tests. He may also require venous reflux evaluation although I do not view this as an urgent thing. We have been using silver alginate. His wound surfaces of cleaned up quite nicely 6/23; patient with chronic venous insufficiency with wounds on his bilateral lower extremities. His wounds all are somewhat better looking. He did go to [name] office but somehow ended up on the doctors schedule rather than being scheduled for noninvasive tests therefore his noninvasive tests are scheduled for July 1. We agree that he has venous insufficiency ulcers but I cannot feel any pulses in his lower extremities dictating the need for test. We are only using Kerlix and light Coban unfortunately this appears to be holding the edema 6/30; has his arterial studies tomorrow. We have been using Kerlix and light Coban will go to a more aggressive compression if the arterial studies will allow. We all agreed these are venous wounds however I cannot feel pulses at either the dorsalis pedis or posterior tibial bilaterally. His wounds generally look some better including left lateral and right posterior. 7/7-Patient returns at 1 week in Kerlix/Coban to both legs, with improvement, in the left lateral and right posterior lower leg wounds, ABI's are normal in both legs per vascular studies, TBI is also normal on both sides, we are using hydrofera blue to the wounds 7/14; patient's arterial studies from 2 Gross ago showed an ABI on the right at 1.03 with a TBI of 0.86. On the left the ABI was 1.06 with a TBI of 0.84. Notable for the fact that his arterial waveforms were monophasic in all of the lower extremity arteries suggesting some degree of arterial occlusive disease but in general this was felt to be fairly adequate for healing. His compression was increased from 2-3 layer which is appropriate. Dressing was changed to [facility] 7/21;  patient's wounds are measuring smaller. The more substantial one on the right posterior calf, second 1 on the left lateral calf. Using [facility] on both wound areas 7/28; patient continues to make nice improvements. The area on the right posterior calf is smaller. Area on the left lateral calf also is smaller. We have been using Hydrofera Blue under compression. The patient will need compression stockings and we have measured him for these in the eventuality that these heal which really should not be too long from now 8/4-Patient continues to make improvement, the right posterior calf area smaller with rim of keratotic skin on one side, the left wound is definitely smaller and improving. 8/11-Returns at 1 week, after being in 3 layer compression on both legs, both wounds appear to be improving, making good  progress, patient is happy, pain is also less especially in the right leg wound 8/18; the area on the left anterior lower leg is healed. On the right posterior leg the wound remains although the dimensions are a lot better. 8/25; he arrives in clinic today with a large body of open wound on the left lateral calf. All of the 3 wounds in this area are in close juxtaposition to each other. The story is that we discharged him last week with no a wrap on the left leg. They went to [facility] could not get in as they are only excepting phone orders or online orders for stockings hence they did not put any stocking on the left leg all week. They have something at home but the patient with that was either incapable or just did not put them on. Apparently these opened 1 morning after getting out of bed. The area on the right has no real change 9/1; patient has bilateral lower extremity wounds in the setting of severe chronic venous insufficiency and secondary lymphedema. He arrived last week with new areas on the left lateral lower leg after we did not wrap him and he did not use his stockings.  Nevertheless the areas on the left look better today under compression. Posterior right calf does not really changed. We are using Hydrofera Blue on both areas under compression 9/15; bilateral lower extremity wounds in the setting of severe chronic venous insufficiency and secondary lymphedema. He has 20 to 30 mmHg below-knee compression stockings under the eventuality that these close over. We did get the left leg to close but he did not transition to a stocking and this reopened. There are 2 open areas on the left posterior lateral calf and one on the right. Both of these look satisfactory. Using [facility] 9/22; bilateral lower extremity wounds in the setting of chronic venous insufficiency. 2 superficial areas on the left lateral calf. One on the right just above the Achilles area. We have good edema control we have been using Hydrofera Blue 9/29; the areas on the left lateral calf are healed. On the right just above the Achilles and tendon area things look a lot better small wounds one scabbed area. We have been using Hydrofera Blue. We can discharge him in his own stocking on the left still wrapping on the right. This is the second time we have healed the left leg but he did not put a stocking on last time. Hopefully this will maintain the edema from chronic venous disease with secondary lymphedema 10/6; he comes in today having a stocking on the left leg. They had trouble getting it on there is a lot of increase in swelling 2 small open areas one anteriorly and one on the medial calf. They report a lot of difficulty getting the stocking on. ooParadoxically the area on the right that we have been wrapping posteriorly is closed 10/13; he comes in today with wounds bilaterally including superficial areas on the left medial and left lateral calf. As well as the right posterior has reopened in the Achilles area superiorly. He still does not have his juxta lite stockings although  truthfully we would not of been able to use them today anyway. Apparently have been ordered and paid for from [facility] although they have not been delivered 10/20; his area on the right is just the boat closed on the right posterior. Still has the area on the left lateral and a very tiny area on the left medial.  He has his bilateral juxta lites although he is not ready for them this week. He tolerated the increase to 4 layer compression last week quite well 10/27; the area on the right posterior calf is once again closed. He has a superficial area on the left lateral calf that is still open. He has been using Hydrofera Blue and bilateral 4-layer compression. He can change to his own juxta lite stocking on the right and we are instructing him today 11/3; the area on the right posterior calf reopened according to the patient and his wife after they took off the stocking when they got home last week. Apparently scabbed over there is now a fairly substantial wound which looks pretty much the same. Our intake nurse noted that they were using the juxta lite stockings appropriately. I was really hoping I might be able to close him out today. He has 1 very tiny remaining area on the left lateral lower leg. 11/10; right posterior calf wound measures smaller but is still open. We have been using Hydrofera Blue. On the left he has a small oval-shaped wound and he seems to have had another wound distally that is open and likely a blister. We are using Hydrofera Blue under compression 11/17; right posterior calf wound continues to get better. We have been using Hydrofera Blue. On the left lateral one of the wounds has closed still a small open area. We have been using Hydrofera Blue on this as well. Both areas have been under 4-layer compression Arrives in clinic today with some swelling in the dorsal foot on the right some erythema of his forefoot and toes. Initially when I looked at this I almost thought this  was a sunburn distal to a wrap injury. 12/1; right posterior calf wound debrided with a curette. We have been using Hydrofera Blue on the left anterior lateral he has an area across the mid tibia. Finally a small area on the left lateral lower calf. Finally he continues to have de-epithelialized areas on the dorsal aspect of his toes. Initially thought this might be a burn injury when I saw him 2 Gross ago. I now wonder about tinea. I have also reviewed his arterial studies which were really quite good in July/20 with normal TBI's and ABIs but monophasic waveforms 12/8; comes in today with worsening problems especially on the left leg where he now has a cluster of wounds in the left anterior mid tibia. Very poor edema control. I reduced him to 3 layer from 4 layer compression last week because of some concern about blood flow to his toes however he does not have good edema control on the left leg. Right leg edema control looks satisfactory. ooOn the left he has a cluster of wounds anteriorly, small area on the left medial fifth met head and then the collection of areas on his toes which appear better ooOn the right he has the original area on the right posterior calf, a new area right medially. His formal arterial studies from mid July are noted below. He was evaluated by [facility] ABI Findings: +---------+------------------+-----+----------+--------+ Right Rt Pressure (mmHg)IndexWaveform Comment  +---------+------------------+-----+----------+--------+ Brachial 176     +---------+------------------+-----+----------+--------+ ATA 176 0.99 monophasic  +---------+------------------+-----+----------+--------+ PTA 183 1.03 monophasic  +---------+------------------+-----+----------+--------+ PERO 172 0.97 monophasic  +---------+------------------+-----+----------+--------+ Great Toe153 0.86     +---------+------------------+-----+----------+--------+ +---------+------------------+-----+-----------+-------+ Left Lt Pressure (mmHg)IndexWaveform Comment +---------+------------------+-----+-----------+-------+ Brachial 178     +---------+------------------+-----+-----------+-------+ ATA 162 0.91 multiphasic  +---------+------------------+-----+-----------+-------+ PTA 188 1.06 multiphasic  +---------+------------------+-----+-----------+-------+ PERO 158 0.89  monophasic   +---------+------------------+-----+-----------+-------+ Great Toe150 0.84    +---------+------------------+-----+-----------+-------+ +-------+-----------+-----------+------------+------------+ ABI/TBIToday's ABIToday's TBIPrevious ABIPrevious TBI +-------+-----------+-----------+------------+------------+ Right 1.03 0.86 1.11   +-------+-----------+-----------+------------+------------+ Left 1.06 0.84 1.27   +-------+-----------+-----------+------------+------------+ Tibial waveforms somewhat difficult to record due to irregular heartbeat. Bilateral ABIs appear essentially unchanged compared to prior study on 06/21/15. Summary: Right: Resting right ankle-brachial index is within normal range. No evidence of significant right lower extremity arterial disease. The right toe-brachial index is normal. Although ankle brachial indices are within normal limits (0.95-1.29), arterial Doppler waveforms at the ankle suggest some component of arterial occlusive disease. Left: Resting left ankle-brachial index is within normal range. No evidence of significant left lower extremity arterial disease. The left toe-brachial index is normal. Although ankle brachial indices are within normal limits (0.95-1.29), arterial Doppler waveforms at the ankle suggest some component of arterial occlusive disease. 12/15; the patient's area on the left mid tibia looks better. Right  posterior calf also better. He has the area on the left foot as well. All of his toes look better I think this was tinea. We are using Hydrofera Blue everywhere else The patient was in urgent care yesterday with wheezing and shortness of breath. He got azithromycin and prednisone. He feels better. His lungs are currently clear to auscultation. He was not tested for Covid 19 12/29; the patient arrives in clinic today with quite a bit change. 2 Gross ago he only had areas on the left mid tibia right posterior calf with tinea pedis resolving between his toes. He arrives in clinic today with several areas on the dorsal toes on the right, dorsal left second toe. He has skin breakdown in the left medial calf probably from excess edema. Small area proximally in the medial calf. He has weeping edema fluid coming out of the skin excoriations on the left medial calf. He tells me that he is having a cardiac catheterization next week. I had a quick look at [facility] health link. He was Gross to have an ejection fraction of 25% during the work-up for persistent atrial fibrillation. He saw his cardiology office yesterday seen by the nurse practitioner. She increased his carvedilol. He has not been on diuretics for apparently several months and indeed in the nurse practitioner Dietrich Pates notes she had knowledge of this. 10/04/2019 on evaluation today patient presents as a walk-in visit concerning the fact that he did not have an appointment here for our clinic at this point. He actually had a cardiac catheterization earlier today and then came from there to here in order to be evaluated. With that being said unfortunately he is having significant cellulitis of his left lower extremity upon evaluation today this appears to have deteriorated even since last week's evaluation with Dr. Dellia Nims. The right lower extremity is actually doing okay I really see no evidence of deterioration at this point at those locations. In  fact the right leg seems in general be doing quite well. Nonetheless I am concerned about infection and cellulitis of the left lower extremity and again considering his weakened heart I do not want him to develop into sepsis at all. He is also having some trouble breathing today and I understand according to nursing staff this is always the case to some degree. With that being said the patient unfortunately seems to be in my opinion a little bit worse even his wife feels like that may be the case today. Unsure exactly what is leading to this. Cardiac catheterization I did review the report which  showed an ejection fraction of 25% he also had an LAD blockage of around 25% based on what I saw. With that being said there was no significant blockages that required stenting at this point he does have weakened cardiac muscles compared to normal. 10/05/2019; patient was seen yesterday in clinic. He was sent to the ER because of cellulitis of the left leg possibility. In the ER he was given 1 dose of IV Levaquin and discharged on Keflex. He came in the clinic initially for a nurse visit to rewrap his left leg. We did not look at the right leg today that is an Haematologist. The patient also had a cardiac cath. According to him there were no blockages but a very low ejection fraction. 1/12; back for an early follow-up. The condition of the left lower leg is a lot better although there are multiple open areas. All of them with not a particularly viable surface. On the right posterior calf he has a single wound with a clean surface. He has a wound with exposed bone on the PIP of the left second toe dorsally. He has wounds on the dorsal right first second and third toes. His arterial studies were normal. 1/19; the patient has 3 open wounds on the left leg anteriorly in the mid tibia, distally and medially just above the ankle and posteriorly. On the right he has a small area dime sized on the right posterior calf.  His edema control is a lot better. He still has wounds on the right second and left second toes. The left second toe has exposed bone. X- ray I did last week did not show evidence of osteomyelitis in the left foot. 1/26; patient with a multiplicity of wounds and problems. ooOn the left he has a circular area on the left anterior mid tibia ooLeft just above the medical ankle -left 2nd toe pip -right 2nd toe right posterior calf 2/2; patient with a multiplicity of wounds and problems. He has severe chronic venous insufficiency and the wounds on his leg are all on the left left anterior left medial at the medial malleolus and left posterior. We have been using Iodoflex to these areas to help with ongoing debridement. He also has largely traumatic wounds on his toes this includes the left second with exposed bone. Bone culture I did last week showed Staph aureus which is methicillin sensitive. I discussed with him today the idea of an amputation of this toe because it is literally nonfunctional however he wants to try antibiotics. Antibiotic choice is complicated by the fact that he is on Coumadin. He also has wounds over the dorsal part of the right first which is close to closed. Right second toe has exposed bone and the right third toe at the base of the right third toe is just about closed as well. We have been using silver alginate 2/9; patient with severe chronic venous insufficiency. He has large wounds on the left anterior mid tibia and on the left medial just above the ankle. I am concerned today about the depth of the area on the left mid tibia may be exposing into the muscle. He has small areas on the left posterior calf and on the right posterior calf although these do not look as threatening. He also has traumatic wounds on his toes. The right first and third are closed however the bilateral second toes have exposed bone. The area on the left is open into the distal interphalangeal  joint. In  my opinion these toes are nonsalvageable and will need amputation 2/16; patient with severe chronic venous insufficiency. He has wounds on the left anterior mid tibia left medial lower leg just above the ankle. He has small areas on the left posterior calf and a more prominent area on the right posterior calf. All of these are related to poorly controlled venous hypertension. He also had traumatic wounds on the PIPs of both second toes. Unfortunately both of these have exposed bone and in the case of the left this I think goes right into the joint itself. I do not think either 1 of these is salvageable. I have reviewed his arterial evaluation that was done in July last year. He also saw Dr. Gwenlyn Gross in June. He felt these were venous stasis. Previously had segmental arterial pressures performed in 11/03/2014 which were normal. He was sent for for a follow-up arterial evaluation on July 1. On the right his ABIs were 1.03 with a TBI of 0.86 although his waveforms were monophasic on the left he had multiphasic waveforms at the ATA PTA but monophasic at the peroneal nevertheless his ABI was normal at 1.06 TBI also normal at 0.84. I think he has enough blood flow to heal an amputation which I think he needs of the left second toe and probably the right second toe as well 2/23; the patient is going for his bilateral second toe amputation by Dr. Amalia Hailey on Friday. In the meantime is 3 areas on the left leg and the small area on the right posterior look better. We have been using silver collagen. 3/2; the patient's toe amputations were canceled because of cardiac concerns. Apparently this surgery will need to be done in the hospital although they do not have an appointment. In the meantime he is continues to have 3 areas on the left leg one anteriorly and 2 smaller areas posteriorly on the left as well as the small posterior area on the right. We have been using silver collagen with compression 3/9;  still no word on the second toe amputations bilaterally. 3 wounds on the left leg all look better and the area on the right posterior we have been using silver collagen I change this to [facility] 3/16; amputations next Monday both second toes he is apparently going to be hospitalized. He has on his left leg including left anterior and left lateral look better very superficial also the right posterior. 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 He continues to have left leg wounds anteriorly x2 posteriorly x1 at the left lower leg just above the ankle anteriorly x1 and posteriorly on the right x1 Objective Constitutional Sitting or standing Blood Pressure is within target range for patient.. Pulse regular and within target range for patient.Marland Kitchen Respirations regular, non-labored and within target range.. Temperature is normal and within the target range for the patient.Marland Kitchen Appears in no distress. Vitals Time Taken: 2:52 PM, Height: 74 in, Weight: 212 lbs, BMI: 27.2, Temperature: 98.2 F, Pulse: 109 bpm, Respiratory Rate: 20 breaths/min, Blood Pressure: 134/70 mmHg. General Notes: Wound exam; all his wounds look about the same to me. Really not making any progress we have good edema control. His pedal pulses are palpable. We did not disturb the surgical dressings Integumentary (Hair, Skin) Wound #13 status is Amputation. Original cause of wound was Gradually Appeared. The wound is located on the Left Toe  Second. The wound measures 0cm length x 0cm width x 0cm depth; 0cm^2 area and 0cm^3 volume. There is no tunneling or undermining noted. There is a none present amount of drainage noted. The wound margin is distinct with the outline attached to the wound base. There is no granulation within the wound bed. There is no necrotic tissue within the wound bed. Wound #19 status is Open. Original cause  of wound was Gradually Appeared. The wound is located on the Left,Anterior Lower Leg. The wound measures 3cm length x 3cm width x 0cm depth; 7.069cm^2 area and 0.707cm^3 volume. There is Fat Layer (Subcutaneous Tissue) Exposed exposed. There is no tunneling or undermining noted. There is a small amount of serosanguineous drainage noted. The wound margin is flat and intact. There is large (67-100%) red, hyper - granulation within the wound bed. There is no necrotic tissue within the wound bed. Wound #21 status is Amputation. Original cause of wound was Gradually Appeared. The wound is located on the Right Toe Second. The wound measures 0cm length x 0cm width x 0cm depth; 0cm^2 area and 0cm^3 volume. There is no tunneling or undermining noted. There is a none present amount of drainage noted. The wound margin is flat and intact. There is no granulation within the wound bed. There is no necrotic tissue within the wound bed. Wound #24 status is Open. Original cause of wound was Gradually Appeared. The wound is located on the [facility] Lower Leg. The wound measures 1cm length x 1cm width x 0.1cm depth; 0.785cm^2 area and 0.079cm^3 volume. There is Fat Layer (Subcutaneous Tissue) Exposed exposed. There is no tunneling or undermining noted. There is a small amount of serosanguineous drainage noted. The wound margin is flat and intact. There is large (67-100%) red granulation within the wound bed. There is no necrotic tissue within the wound bed. Wound #25 status is Open. Original cause of wound was Gradually Appeared. The wound is located on the Left,Distal,Posterior Lower Leg. The wound measures 1.2cm length x 0.8cm width x 0.1cm depth; 0.754cm^2 area and 0.075cm^3 volume. There is Fat Layer (Subcutaneous Tissue) Exposed exposed. There is no tunneling or undermining noted. There is a small amount of serosanguineous drainage noted. The wound margin is flat and intact. There is medium (34-66%)  pink granulation within the wound bed. There is a medium (34-66%) amount of necrotic tissue within the wound bed including Adherent Slough. Wound #3R status is Open. Original cause of wound was Gradually Appeared. The wound is located on the Right,Posterior Calf. The wound measures 1.3cm length x 1cm width x 0.1cm depth; 1.021cm^2 area and 0.102cm^3 volume. There is Fat Layer (Subcutaneous Tissue) Exposed exposed. There is no tunneling or undermining noted. There is a small amount of serosanguineous drainage noted. The wound margin is distinct with the outline attached to the wound base. There is large (67-100%) red granulation within the wound bed. There is no necrotic tissue within the wound bed. 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 - Tuesday **********EXTRA TIME**************** Dressing Change Frequency: Wound #19 Left,Anterior Lower Leg: Do not change entire dressing for one week. Wound #24 Left,Distal,Anterior Lower Leg: Do not change entire dressing for one week. Wound #25 Left,Distal,Posterior Lower Leg: Do not change entire dressing for one week. Wound #3R Right,Posterior Calf: Do not change entire dressing for one week. Skin Barriers/Peri-Wound Care: TCA Cream or Ointment Wound Cleansing: May shower with  protection. Primary Wound Dressing: Wound #19 Left,Anterior Lower Leg: Hydrofera Blue Wound #24 Left,Distal,Anterior Lower Leg: Hydrofera Blue Wound #25 Left,Distal,Posterior Lower Leg: Hydrofera Blue Wound #3R Right,Posterior Calf: Hydrofera Blue Secondary Dressing: Dry Gauze - secure toes with tape Edema Control: Wound #19 Left,Anterior Lower Leg: Kerlix and Coban - Bilateral Wound #24 Left,Distal,Anterior Lower Leg: Kerlix and Coban - Bilateral Wound #25 Left,Distal,Posterior Lower Leg: Kerlix and Coban - Bilateral Wound #3R Right,Posterior Calf: Kerlix and Coban - Bilateral Off-Loading: Open toe surgical shoe to: - left and right foot 1. We continued with Hydrofera Blue but put him in kerlixCoban wraps as opposed to the [employer] because of the extent of the wraps on his toes. Electronic Signature(s) Signed: 12/19/2019 5:53:09 PM By: [name] MD Entered By: [name] on 12/19/2019 16:00:16 -------------------------------------------------------------------------------- SuperBill Details Patient Name: Date of Service: Armando Gang 12/19/2019 Medical Record [id number] Patient Account Number: [id number] Date of Birth/Sex: Treating RN: 03-Sep-1941 (79 y.o. [name]) [name] Primary Care Provider: Jeralyn Gross Other Clinician: Referring Provider: Treating Provider/Extender:Tonjia Parillo, Cheryl Gross, [place] Gross in Treatment: 60 Diagnosis Coding ICD-10 Codes Code Description A26.333 Non-pressure chronic ulcer of right calf limited to breakdown of skin L97.221 Non-pressure chronic ulcer of left calf limited to breakdown of skin I87.333 Chronic venous hypertension (idiopathic) with ulcer and inflammation of bilateral lower extremity E11.51 Type 2 diabetes mellitus with diabetic peripheral angiopathy without gangrene E11.42 Type 2 diabetes mellitus with diabetic polyneuropathy L97.524 Non-pressure chronic ulcer of other part of left foot with necrosis of  bone L97.511 Non-pressure chronic ulcer of other part of right foot limited to breakdown of skin L45.62 Chronic systolic (congestive) heart failure B63.893 Other chronic osteomyelitis, left ankle and foot Facility Procedures The patient participates with Medicare or their insurance follows the Medicare Facility Guidelines: CPT4 Code Description Modifier Quantity [id number] - WOUND CARE VISIT-LEV 4 EST PT 1 Physician Procedures CPT4: Description Modifier Quantity Code [id number] - WC PHYS LEVEL 3 - EST PT 1 ICD-10 Diagnosis Description L97.211 Non-pressure chronic ulcer of right calf limited to breakdown of skin L97.221 Non-pressure chronic ulcer of left calf limited to  breakdown of skin I87.333 Chronic venous hypertension (idiopathic) with ulcer and inflammation of bilateral lower extremity Electronic Signature(s) Signed: 12/19/2019 5:53:09 PM By: [name] MD Entered By: [name] on 12/19/2019 16:00:43

## 2019-12-20 NOTE — Anesthesia Postprocedure Evaluation (Signed)
Anesthesia Post Note  Patient: [name]  Procedure(s) Performed: BILATERAL 2ND TOE AMPUTATION DIGIT (Bilateral Foot)     Patient location during evaluation: PACU Anesthesia Type: MAC Level of consciousness: awake and alert Pain management: pain level controlled Vital Signs Assessment: post-procedure vital signs reviewed and stable Respiratory status: spontaneous breathing Cardiovascular status: stable Anesthetic complications: no    Last Vitals:  Vitals:   12/18/19 1451 12/18/19 1452  BP:    Pulse: 62 73  Resp: 20 (!) 22  Temp:    SpO2: 100% 99%    Last Pain:  Vitals:   12/18/19 1420  TempSrc:   PainSc: 0-No pain                 Nolon Nations

## 2019-12-23 LAB — AEROBIC/ANAEROBIC CULTURE W GRAM STAIN (SURGICAL/DEEP WOUND): Gram Stain: NONE SEEN

## 2019-12-25 ENCOUNTER — Ambulatory Visit (INDEPENDENT_AMBULATORY_CARE_PROVIDER_SITE_OTHER): Payer: Medicare HMO | Admitting: Podiatry

## 2019-12-25 ENCOUNTER — Ambulatory Visit (INDEPENDENT_AMBULATORY_CARE_PROVIDER_SITE_OTHER): Payer: Medicare HMO

## 2019-12-25 ENCOUNTER — Encounter: Payer: Medicare HMO | Admitting: Podiatry

## 2019-12-25 ENCOUNTER — Other Ambulatory Visit: Payer: Self-pay

## 2019-12-25 DIAGNOSIS — Z9889 Other specified postprocedural states: Secondary | ICD-10-CM

## 2019-12-25 DIAGNOSIS — E11621 Type 2 diabetes mellitus with foot ulcer: Secondary | ICD-10-CM

## 2019-12-25 DIAGNOSIS — L97519 Non-pressure chronic ulcer of other part of right foot with unspecified severity: Secondary | ICD-10-CM | POA: Diagnosis not present

## 2019-12-25 DIAGNOSIS — L97529 Non-pressure chronic ulcer of other part of left foot with unspecified severity: Secondary | ICD-10-CM

## 2019-12-26 ENCOUNTER — Encounter (HOSPITAL_BASED_OUTPATIENT_CLINIC_OR_DEPARTMENT_OTHER): Payer: Medicare HMO | Admitting: Internal Medicine

## 2019-12-26 ENCOUNTER — Telehealth: Payer: Self-pay | Admitting: *Deleted

## 2019-12-26 DIAGNOSIS — L97511 Non-pressure chronic ulcer of other part of right foot limited to breakdown of skin: Secondary | ICD-10-CM | POA: Diagnosis not present

## 2019-12-26 DIAGNOSIS — L97211 Non-pressure chronic ulcer of right calf limited to breakdown of skin: Secondary | ICD-10-CM | POA: Diagnosis not present

## 2019-12-26 DIAGNOSIS — I87333 Chronic venous hypertension (idiopathic) with ulcer and inflammation of bilateral lower extremity: Secondary | ICD-10-CM | POA: Diagnosis not present

## 2019-12-26 DIAGNOSIS — L97221 Non-pressure chronic ulcer of left calf limited to breakdown of skin: Secondary | ICD-10-CM | POA: Diagnosis not present

## 2019-12-26 DIAGNOSIS — E1142 Type 2 diabetes mellitus with diabetic polyneuropathy: Secondary | ICD-10-CM | POA: Diagnosis not present

## 2019-12-26 DIAGNOSIS — L97524 Non-pressure chronic ulcer of other part of left foot with necrosis of bone: Secondary | ICD-10-CM | POA: Diagnosis not present

## 2019-12-26 DIAGNOSIS — E1151 Type 2 diabetes mellitus with diabetic peripheral angiopathy without gangrene: Secondary | ICD-10-CM | POA: Diagnosis not present

## 2019-12-26 DIAGNOSIS — E11622 Type 2 diabetes mellitus with other skin ulcer: Secondary | ICD-10-CM | POA: Diagnosis not present

## 2019-12-26 DIAGNOSIS — L97212 Non-pressure chronic ulcer of right calf with fat layer exposed: Secondary | ICD-10-CM | POA: Diagnosis not present

## 2019-12-26 DIAGNOSIS — M86672 Other chronic osteomyelitis, left ankle and foot: Secondary | ICD-10-CM | POA: Diagnosis not present

## 2019-12-26 DIAGNOSIS — I5022 Chronic systolic (congestive) heart failure: Secondary | ICD-10-CM | POA: Diagnosis not present

## 2019-12-26 NOTE — Telephone Encounter (Signed)
Jeralyn Ruths, NP - Linna Hoff states she received wound culture and wanted to make sure Dr. Amalia Hailey had pt on an antibiotic.

## 2019-12-26 NOTE — Telephone Encounter (Signed)
I spoke with Jeralyn Ruths, NP and she states in Dr. Amalia Hailey note pt has completed the keflex, but wanted to make sure no other therapy was needed and of pt's status.

## 2019-12-27 NOTE — Progress Notes (Signed)
   Subjective:  Patient presents today status post bilateral 2nd toe amputation. DOS: 12/18/2019. He states he is doing well. He denies any pain or modifying factors. He denies taking any pain medication. He has been using the post op shoes as directed. Patient is here for further evaluation and treatment.    Past Medical History:  Diagnosis Date  . Cancer ([place])   . CHF (congestive heart failure) ([place])   . Chronic atrial fibrillation ([place])   . Chronic bronchitis   . Diabetes mellitus, type II ([place])    no insulin  . Gout   . History of herpes zoster virus   . History of radiation therapy 12/21/12- 02/15/13   prostate 78 gray in 40 fx  . Hyperlipidemia   . Hypertension   . Prostate cancer ([place]) 2014   EBRT + hormonal therapy  . Vitamin D deficiency disease 06/07/2019      Objective/Physical Exam Neurovascular status intact.  Skin incisions appear to be well coapted with sutures and staples intact. No sign of infectious process noted. No dehiscence. No active bleeding noted. Moderate edema noted to the surgical extremity.  Radiographic Exam:  Absence of 2nd toes noted at the MTPJ.   Assessment: 1. s/p bilateral 2nd toe amputations. DOS: 12/18/2019   Plan of Care:  1. Patient was evaluated. X-rays reviewed 2. Dressing changed. Keep clean, dry and intact for one week.  3. Continue weightbearing in post op shoes bilaterally.  4. Return to clinic in one week.    Edrick Kins, DPM [facility]  Dr. Edrick Kins, [place]                                        [place],  [address]                Office [phone]  Fax [phone]

## 2020-01-01 ENCOUNTER — Other Ambulatory Visit: Payer: Self-pay

## 2020-01-01 ENCOUNTER — Ambulatory Visit (INDEPENDENT_AMBULATORY_CARE_PROVIDER_SITE_OTHER): Payer: Medicare HMO | Admitting: Podiatry

## 2020-01-01 ENCOUNTER — Encounter: Payer: Self-pay | Admitting: Podiatry

## 2020-01-01 VITALS — Temp 98.2°F

## 2020-01-01 DIAGNOSIS — E0843 Diabetes mellitus due to underlying condition with diabetic autonomic (poly)neuropathy: Secondary | ICD-10-CM

## 2020-01-01 DIAGNOSIS — M79672 Pain in left foot: Secondary | ICD-10-CM

## 2020-01-01 DIAGNOSIS — M79671 Pain in right foot: Secondary | ICD-10-CM

## 2020-01-01 DIAGNOSIS — Z9889 Other specified postprocedural states: Secondary | ICD-10-CM

## 2020-01-01 NOTE — Progress Notes (Signed)
[name], [name] ([id number]) Visit Report for 12/26/2019 Debridement Details Patient Name: Date of Service: [name], [name] 12/26/2019 8:00 AM Medical Record [name] Patient Account Number: [id number] Date of Birth/Sex: Treating RN: [birth year] (78 y.o. [name]) [name] Primary Care Provider: Jeralyn Gross Other Clinician: Referring Provider: Treating Provider/Extender:[name], [name], [facility] Weeks in Treatment: 41 Debridement Performed for Wound #3R Right,Posterior Calf Assessment: Performed By: Physician [name]., MD Debridement Type: Debridement Severity of Tissue Pre Fat layer exposed Debridement: Level of Consciousness (Pre- Awake and Alert procedure): Pre-procedure Verification/Time Out Taken: Yes - 08:40 Start Time: 08:40 Pain Control: Lidocaine 5% topical ointment Total Area Debrided (L x W): 0.8 (cm) x 0.8 (cm) = 0.64 (cm) Tissue and other material Viable, Non-Viable, Slough, Subcutaneous, Skin: Dermis , Skin: Epidermis, Slough debrided: Level: Skin/Subcutaneous Tissue Debridement Description: Excisional Instrument: Curette Bleeding: Minimum Hemostasis Achieved: Pressure End Time: 08:41 Procedural Pain: 0 Post Procedural Pain: 0 Response to Treatment: Procedure was tolerated well Level of Consciousness Awake and Alert (Post-procedure): Post Debridement Measurements of Total Wound Length: (cm) 0.8 Width: (cm) 0.8 Depth: (cm) 0.1 Volume: (cm) 0.05 Character of Wound/Ulcer Post Improved Debridement: Severity of Tissue Post Debridement: Fat layer exposed Post Procedure Diagnosis Same as Pre-procedure Electronic Signature(s) Signed: 12/27/2019 5:40:01 PM By: Daniel Coria RN Signed: 01/01/2020 7:44:34 AM By: Daniel Ham MD Entered By: [name] on 12/26/2019 09:16:39 -------------------------------------------------------------------------------- HPI Details Patient Name: Date of Service: Daniel Pia D. 12/26/2019 8:00  AM Medical Record [id number] Patient Account Number: [id number] Date of Birth/Sex: Treating RN: 08/15/1941 (78 y.o. [name] Primary Care Provider: Jeralyn Gross Other Clinician: Referring Provider: Treating Provider/Extender:[name], [name], [name] Weeks in Treatment: 20 History of Present Illness Location: Patient presents with a wound to left lower leg. Quality: Patient reports No Pain. Duration: 2 months HPI Description: no cig or alcohol. spontaneous appearance in area of stasis dermamtitis. [name] on metformin only. chronic afib on Coumadin. diabetes and coag studies not good. hba1c 7.5. ivr 4.5. no pain or sxs of systemic disease. hx chf. no intermittent claudication 02/28/2019 Readmission This is a now a 79 year old man who was previously cared for in 2016 by Dr. Lindon Gross for wounds on his lower extremities. At that point he had venous reflux studies although I cannot seem to open these in [place] link. He had arterial studies showing an ABI of 1.11 on the right and 1.27 on the left his waveforms were triphasic bilaterally. He was discharged in stockings although I do not believe he is wearing these in some time. He tells me that about a month ago he noted openings of a large wound on the posterior right calf and 2 smaller areas on the left lateral calf and a small area more recently on the left posterior calf. He has been dressing these with peroxide and triple antibiotic ointment. He is not wearing compression. Past medical history; type 2 diabetes with peripheral neuropathy, chronic venous insufficiency, hypertension, cardiomyopathy, chronic atrial fibrillation on Coumadin, prostate cancer, hyperlipidemia, gout, ABI in our clinic was 1.34 on the left and not obtainable on the right 6/9; this is a patient who has chronic venous insufficiency. He has a fairly substantial area on the right posterior calf, left lateral calf and a small area on the left posterior calf.  On arrival last week he had very palpable popliteal and femoral pulses but nothing in his bilateral feet. Unfortunately we cannot get arterial studies until July 1 at Dr. Kennon Gross office. They live in [place]. We use  silver alginate under Kerlix Coban 6/16; patient with chronic venous insufficiency with wounds on his bilateral lower extremities. When he came into our clinic he was discovered to have a complete absence of peripheral pedal pulses at either the dorsalis pedis or posterior tibial. He does have easily palpable femoral and popliteal pulses. He sees Dr. Gwenlyn Gross tomorrow for noninvasive arterial tests. He may also require venous reflux evaluation although I do not view this as an urgent thing. We have been using silver alginate. His wound surfaces of cleaned up quite nicely 6/23; patient with chronic venous insufficiency with wounds on his bilateral lower extremities. His wounds all are somewhat better looking. He did go to Dr. Kennon Gross office but somehow ended up on the doctors schedule rather than being scheduled for noninvasive tests therefore his noninvasive tests are scheduled for July 1. We agree that he has venous insufficiency ulcers but I cannot feel any pulses in his lower extremities dictating the need for test. We are only using Kerlix and light Coban unfortunately this appears to be holding the edema 6/30; has his arterial studies tomorrow. We have been using Kerlix and light Coban will go to a more aggressive compression if the arterial studies will allow. We all agreed these are venous wounds however I cannot feel pulses at either the dorsalis pedis or posterior tibial bilaterally. His wounds generally look some better including left lateral and right posterior. 7/7-Patient returns at 1 week in Kerlix/Coban to both legs, with improvement, in the left lateral and right posterior lower leg wounds, ABI's are normal in both legs per vascular studies, TBI is also normal on both  sides, we are using hydrofera blue to the wounds 7/14; patient's arterial studies from 2 weeks ago showed an ABI on the right at 1.03 with a TBI of 0.86. On the left the ABI was 1.06 with a TBI of 0.84. Notable for the fact that his arterial waveforms were monophasic in all of the lower extremity arteries suggesting some degree of arterial occlusive disease but in general this was felt to be fairly adequate for healing. His compression was increased from 2-3 layer which is appropriate. Dressing was changed to [facility] 7/21; patient's wounds are measuring smaller. The more substantial one on the right posterior calf, second 1 on the left lateral calf. Using [facility] on both wound areas 7/28; patient continues to make nice improvements. The area on the right posterior calf is smaller. Area on the left lateral calf also is smaller. We have been using Hydrofera Blue under compression. The patient will need compression stockings and we have measured him for these in the eventuality that these heal which really should not be too long from now 8/4-Patient continues to make improvement, the right posterior calf area smaller with rim of keratotic skin on one side, the left wound is definitely smaller and improving. 8/11-Returns at 1 week, after being in 3 layer compression on both legs, both wounds appear to be improving, making good progress, patient is happy, pain is also less especially in the right leg wound 8/18; the area on the left anterior lower leg is healed. On the right posterior leg the wound remains although the dimensions are a lot better. 8/25; he arrives in clinic today with a large body of open wound on the left lateral calf. All of the 3 wounds in this area are in close juxtaposition to each other. The story is that we discharged him last week with no a wrap on  the left leg. They went to  could not get in as they are only excepting phone orders or online orders  for stockings hence they did not put any stocking on the left leg all week. They have something at home but the patient with that was either incapable or just did not put them on. Apparently these opened 1 morning after getting out of bed. The area on the right has no real change 9/1; patient has bilateral lower extremity wounds in the setting of severe chronic venous insufficiency and secondary lymphedema. He arrived last week with new areas on the left lateral lower leg after we did not wrap him and he did not use his stockings. Nevertheless the areas on the left look better today under compression. Posterior right calf does not really changed. We are using Hydrofera Blue on both areas under compression 9/15; bilateral lower extremity wounds in the setting of severe chronic venous insufficiency and secondary lymphedema. He has 20 to 30 mmHg below-knee compression stockings under the eventuality that these close over. We did get the left leg to close but he did not transition to a stocking and this reopened. There are 2 open areas on the left posterior lateral calf and one on the right. Both of these look satisfactory. Using [facility] 9/22; bilateral lower extremity wounds in the setting of chronic venous insufficiency. 2 superficial areas on the left lateral calf. One on the right just above the Achilles area. We have good edema control we have been using Hydrofera Blue 9/29; the areas on the left lateral calf are healed. On the right just above the Achilles and tendon area things look a lot better small wounds one scabbed area. We have been using Hydrofera Blue. We can discharge him in his own stocking on the left still wrapping on the right. This is the second time we have healed the left leg but he did not put a stocking on last time. Hopefully this will maintain the edema from chronic venous disease with secondary lymphedema 10/6; he comes in today having a stocking on the left leg.  They had trouble getting it on there is a lot of increase in swelling 2 small open areas one anteriorly and one on the medial calf. They report a lot of difficulty getting the stocking on. Paradoxically the area on the right that we have been wrapping posteriorly is closed 10/13; he comes in today with wounds bilaterally including superficial areas on the left medial and left lateral calf. As well as the right posterior has reopened in the Achilles area superiorly. He still does not have his juxta lite stockings although truthfully we would not of been able to use them today anyway. Apparently have been ordered and paid for from [employer] although they have not been delivered 10/20; his area on the right is just the boat closed on the right posterior. Still has the area on the left lateral and a very tiny area on the left medial. He has his bilateral juxta lites although he is not ready for them this week. He tolerated the increase to 4 layer compression last week quite well 10/27; the area on the right posterior calf is once again closed. He has a superficial area on the left lateral calf that is still open. He has been using Hydrofera Blue and bilateral 4-layer compression. He can change to his own juxta lite stocking on the right and we are instructing him today 11/3; the area on the right  posterior calf reopened according to the patient and his wife after they took off the stocking when they got home last week. Apparently scabbed over there is now a fairly substantial wound which looks pretty much the same. Our intake nurse noted that they were using the juxta lite stockings appropriately. I was really hoping I might be able to close him out today. He has 1 very tiny remaining area on the left lateral lower leg. 11/10; right posterior calf wound measures smaller but is still open. We have been using Hydrofera Blue. On the left he has a small Daniel-shaped wound and he seems to have had another  wound distally that is open and likely a blister. We are using Hydrofera Blue under compression 11/17; right posterior calf wound continues to get better. We have been using Hydrofera Blue. On the left lateral one of the wounds has closed still a small open area. We have been using Hydrofera Blue on this as well. Both areas have been under 4-layer compression Arrives in clinic today with some swelling in the dorsal foot on the right some erythema of his forefoot and toes. Initially when I looked at this I almost thought this was a sunburn distal to a wrap injury. 12/1; right posterior calf wound debrided with a curette. We have been using Hydrofera Blue on the left anterior lateral he has an area across the mid tibia. Finally a small area on the left lateral lower calf. Finally he continues to have de-epithelialized areas on the dorsal aspect of his toes. Initially thought this might be a burn injury when I saw him 2 weeks ago. I now wonder about tinea. I have also reviewed his arterial studies which were really quite good in July/20 with normal TBI's and ABIs but monophasic waveforms 12/8; comes in today with worsening problems especially on the left leg where he now has a cluster of wounds in the left anterior mid tibia. Very poor edema control. I reduced him to 3 layer from 4 layer compression last week because of some concern about blood flow to his toes however he does not have good edema control on the left leg. Right leg edema control looks satisfactory. On the left he has a cluster of wounds anteriorly, small area on the left medial fifth met head and then the collection of areas on his toes which appear better On the right he has the original area on the right posterior calf, a new area right medially. His formal arterial studies from mid July are noted below. He was evaluated by Dr.Berry ABI Findings: +---------+------------------+-----+----------+--------+ Right Rt Pressure  (mmHg)IndexWaveform Comment  +---------+------------------+-----+----------+--------+ Brachial 176     +---------+------------------+-----+----------+--------+ ATA 176 0.99 monophasic  +---------+------------------+-----+----------+--------+ PTA 183 1.03 monophasic  +---------+------------------+-----+----------+--------+ PERO 172 0.97 monophasic  +---------+------------------+-----+----------+--------+ Great Toe153 0.86    +---------+------------------+-----+----------+--------+ +---------+------------------+-----+-----------+-------+ Left Lt Pressure (mmHg)IndexWaveform Comment +---------+------------------+-----+-----------+-------+ Brachial 178     +---------+------------------+-----+-----------+-------+ ATA 162 0.91 multiphasic  +---------+------------------+-----+-----------+-------+ PTA 188 1.06 multiphasic  +---------+------------------+-----+-----------+-------+ PERO 158 0.89 monophasic   +---------+------------------+-----+-----------+-------+ Great Toe150 0.84    +---------+------------------+-----+-----------+-------+ +-------+-----------+-----------+------------+------------+ ABI/TBIToday's ABIToday's TBIPrevious ABIPrevious TBI +-------+-----------+-----------+------------+------------+ Right 1.03 0.86 1.11   +-------+-----------+-----------+------------+------------+ Left 1.06 0.84 1.27   +-------+-----------+-----------+------------+------------+ Tibial waveforms somewhat difficult to record due to irregular heartbeat. Bilateral ABIs appear essentially unchanged compared to prior study on 06/21/15. Summary: Right: Resting right ankle-brachial index is within normal range. No evidence of significant right lower extremity arterial disease. The right toe-brachial index is normal. Although ankle brachial indices are within normal limits (0.95-1.29),  arterial Doppler waveforms at the  ankle suggest some component of arterial occlusive disease. Left: Resting left ankle-brachial index is within normal range. No evidence of significant left lower extremity arterial disease. The left toe-brachial index is normal. Although ankle brachial indices are within normal limits (0.95-1.29), arterial Doppler waveforms at the ankle suggest some component of arterial occlusive disease. 12/15; the patient's area on the left mid tibia looks better. Right posterior calf also better. He has the area on the left foot as well. All of his toes look better I think this was tinea. We are using Hydrofera Blue everywhere else The patient was in urgent care yesterday with wheezing and shortness of breath. He got azithromycin and prednisone. He feels better. His lungs are currently clear to auscultation. He was not tested for Covid 19 12/29; the patient arrives in clinic today with quite a bit change. 2 weeks ago he only had areas on the left mid tibia right posterior calf with tinea pedis resolving between his toes. He arrives in clinic today with several areas on the dorsal toes on the right, dorsal left second toe. He has skin breakdown in the left medial calf probably from excess edema. Small area proximally in the medial calf. He has weeping edema fluid coming out of the skin excoriations on the left medial calf. He tells me that he is having a cardiac catheterization next week. I had a quick look at [facility] health link. He was Gross to have an ejection fraction of 25% during the work-up for persistent atrial fibrillation. He saw his cardiology office yesterday seen by the nurse practitioner. She increased his carvedilol. He has not been on diuretics for apparently several months and indeed in the nurse practitioner Dietrich Pates notes she had knowledge of this. 10/04/2019 on evaluation today patient presents as a walk-in visit concerning the fact that he did not have an appointment here for our clinic at  this point. He actually had a cardiac catheterization earlier today and then came from there to here in order to be evaluated. With that being said unfortunately he is having significant cellulitis of his left lower extremity upon evaluation today this appears to have deteriorated even since last week's evaluation with Dr. Dellia Nims. The right lower extremity is actually doing okay I really see no evidence of deterioration at this point at those locations. In fact the right leg seems in general be doing quite well. Nonetheless I am concerned about infection and cellulitis of the left lower extremity and again considering his weakened heart I do not want him to develop into sepsis at all. He is also having some trouble breathing today and I understand according to nursing staff this is always the case to some degree. With that being said the patient unfortunately seems to be in my opinion a little bit worse even his wife feels like that may be the case today. Unsure exactly what is leading to this. Cardiac catheterization I did review the report which showed an ejection fraction of 25% he also had an LAD blockage of around 25% based on what I saw. With that being said there was no significant blockages that required stenting at this point he does have weakened cardiac muscles compared to normal. 10/05/2019; patient was seen yesterday in clinic. He was sent to the ER because of cellulitis of the left leg possibility. In the ER he was given 1 dose of IV Levaquin and discharged on Keflex. He came in the clinic initially for  a nurse visit to rewrap his left leg. We did not look at the right leg today that is an Haematologist. The patient also had a cardiac cath. According to him there were no blockages but a very low ejection fraction. 1/12; back for an early follow-up. The condition of the left lower leg is a lot better although there are multiple open areas. All of them with not a particularly viable surface.  On the right posterior calf he has a single wound with a clean surface. He has a wound with exposed bone on the PIP of the left second toe dorsally. He has wounds on the dorsal right first second and third toes. His arterial studies were normal. 1/19; the patient has 3 open wounds on the left leg anteriorly in the mid tibia, distally and medially just above the ankle and posteriorly. On the right he has a small area dime sized on the right posterior calf. His edema control is a lot better. He still has wounds on the right second and left second toes. The left second toe has exposed bone. X- ray I did last week did not show evidence of osteomyelitis in the left foot. 1/26; patient with a multiplicity of wounds and problems. On the left he has a circular area on the left anterior mid tibia Left just above the medical ankle -left 2nd toe pip -right 2nd toe right posterior calf 2/2; patient with a multiplicity of wounds and problems. He has severe chronic venous insufficiency and the wounds on his leg are all on the left left anterior left medial at the medial malleolus and left posterior. We have been using Iodoflex to these areas to help with ongoing debridement. He also has largely traumatic wounds on his toes this includes the left second with exposed bone. Bone culture I did last week showed Staph aureus which is methicillin sensitive. I discussed with him today the idea of an amputation of this toe because it is literally nonfunctional however he wants to try antibiotics. Antibiotic choice is complicated by the fact that he is on Coumadin. He also has wounds over the dorsal part of the right first which is close to closed. Right second toe has exposed bone and the right third toe at the base of the right third toe is just about closed as well. We have been using silver alginate 2/9; patient with severe chronic venous insufficiency. He has large wounds on the left anterior mid tibia and on  the left medial just above the ankle. I am concerned today about the depth of the area on the left mid tibia may be exposing into the muscle. He has small areas on the left posterior calf and on the right posterior calf although these do not look as threatening. He also has traumatic wounds on his toes. The right first and third are closed however the bilateral second toes have exposed bone. The area on the left is open into the distal interphalangeal joint. In my opinion these toes are nonsalvageable and will need amputation 2/16; patient with severe chronic venous insufficiency. He has wounds on the left anterior mid tibia left medial lower leg just above the ankle. He has small areas on the left posterior calf and a more prominent area on the right posterior calf. All of these are related to poorly controlled venous hypertension. He also had traumatic wounds on the PIPs of both second toes. Unfortunately both of these have exposed bone and in the case of  the left this I think goes right into the joint itself. I do not think either 1 of these is salvageable. I have reviewed his arterial evaluation that was done in July last year. He also saw Dr. Gwenlyn Gross in June. He felt these were venous stasis. Previously had segmental arterial pressures performed in 11/03/2014 which were normal. He was sent for for a follow-up arterial evaluation on July 1. On the right his ABIs were 1.03 with a TBI of 0.86 although his waveforms were monophasic on the left he had multiphasic waveforms at the ATA PTA but monophasic at the peroneal nevertheless his ABI was normal at 1.06 TBI also normal at 0.84. I think he has enough blood flow to heal an amputation which I think he needs of the left second toe and probably the right second toe as well 2/23; the patient is going for his bilateral second toe amputation by Dr. Amalia Hailey on Friday. In the meantime is 3 areas on the left leg and the small area on the right posterior look  better. We have been using silver collagen. 3/2; the patient's toe amputations were canceled because of cardiac concerns. Apparently this surgery will need to be done in the hospital although they do not have an appointment. In the meantime he is continues to have 3 areas on the left leg one anteriorly and 2 smaller areas posteriorly on the left as well as the small posterior area on the right. We have been using silver collagen with compression 3/9; still no word on the second toe amputations bilaterally. 3 wounds on the left leg all look better and the area on the right posterior we have been using silver collagen I change this to [facility] 3/16; amputations next Monday both second toes he is apparently going to be hospitalized. He has on his left leg including left anterior and left lateral look better very superficial also the right posterior. 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 He continues to have left leg wounds anteriorly x2 posteriorly x1 at the left lower leg just above the ankle anteriorly x1 and posteriorly on the right x1 3/30; his anterior left leg wounds are considerably smaller. In fact the only wound that is not smaller is on the right. We have been using Hydrofera Blue under Kerlix Cobax Electronic Signature(s) Signed: 01/01/2020 7:44:34 AM By: Daniel Ham MD Entered By: [name] on 12/26/2019 09:17:28 -------------------------------------------------------------------------------- Physical Exam Details Patient Name: Date of Service: [name], [name] 12/26/2019 8:00 AM Medical Record [id number] Patient Account Number: [id number] Date of Birth/Sex: Treating RN: 1941/02/15 (78 y.o. [name]) [name] Primary Care Provider: Jeralyn Gross Other Clinician: Referring Provider: Treating Provider/Extender:Tippi Mccrae, [name], [facility] Weeks in Treatment:  62 Constitutional Sitting or standing Blood Pressure is within target range for patient.. Pulse regular and within target range for patient.Marland Kitchen Respirations regular, non-labored and within target range.. Temperature is normal and within the target range for the patient.Marland Kitchen Appears in no distress. Cardiovascular The pulses are palpable. Minimal edema.. Notes Wound exam; all his wounds are improved except for the area on the posterior right. This required debridement with a #3 curette. Removing tightly adherent fibrinous debris. Fortunately this cleans up quite nicely. The rest of his wounds are smaller and superficial. Electronic Signature(s) Signed: 01/01/2020 7:44:34 AM By: Daniel Ham MD Entered By: [name] on 12/26/2019 09:19:06 -------------------------------------------------------------------------------- Physician Orders Details Patient Name:  Date of Service: [name], [name] 12/26/2019 8:00 AM Medical Record [place] Patient Account Number: [id number] Date of Birth/Sex: Treating RN: 10/26/1940 (78 y.o. [name]) [name] Primary Care Provider: Jeralyn Gross Other Clinician: Referring Provider: Treating Provider/Extender:Nixie Laube, [name], [facility] Weeks in Treatment: 10 Verbal / Phone Orders: No Diagnosis Coding ICD-10 Coding Code Description L97.211 Non-pressure chronic ulcer of right calf limited to breakdown of skin L97.221 Non-pressure chronic ulcer of left calf limited to breakdown of skin I87.333 Chronic venous hypertension (idiopathic) with ulcer and inflammation of bilateral lower extremity E11.51 Type 2 diabetes mellitus with diabetic peripheral angiopathy without gangrene E11.42 Type 2 diabetes mellitus with diabetic polyneuropathy L97.524 Non-pressure chronic ulcer of other part of left foot with necrosis of bone L97.511 Non-pressure chronic ulcer of other part of right foot limited to breakdown of skin Q33.00 Chronic systolic (congestive) heart  failure T62.263 Other chronic osteomyelitis, left ankle and foot Follow-up Appointments Return Appointment in 1 week. - Tuesday **********EXTRA TIME**************** Dressing Change Frequency Wound #19 Left,Anterior Lower Leg Do not change entire dressing for one week. Wound #24 Left,Distal,Anterior Lower Leg Do not change entire dressing for one week. Wound #25 Left,Distal,Posterior Lower Leg Do not change entire dressing for one week. Wound #3R Right,Posterior Calf Do not change entire dressing for one week. Skin Barriers/Peri-Wound Care TCA Cream or Ointment Wound Cleansing May shower with protection. Primary Wound Dressing Wound #19 Left,Anterior Lower Leg Hydrofera Blue Wound #24 Left,Distal,Anterior Lower Leg Hydrofera Blue Wound #25 Left,Distal,Posterior Lower Leg Hydrofera Blue Wound #3R Right,Posterior Calf Hydrofera Blue Secondary Dressing Dry Gauze - secure toes with tape Edema Control Wound #19 Left,Anterior Lower Leg Kerlix and Coban - Bilateral Wound #24 Left,Distal,Anterior Lower Leg Kerlix and Coban - Bilateral Wound #25 Left,Distal,Posterior Lower Leg Kerlix and Coban - Bilateral Wound #3R Right,Posterior Calf Kerlix and Coban - Bilateral Off-Loading Open toe surgical shoe to: - left and right foot Electronic Signature(s) Signed: 12/27/2019 5:40:01 PM By: Daniel Coria RN Signed: 01/01/2020 7:44:34 AM By: Daniel Ham MD Entered By: [name] on 12/26/2019 07:59:00 -------------------------------------------------------------------------------- Problem List Details Patient Name: Date of Service: Daniel Pia D. 12/26/2019 8:00 AM Medical Record [id number] Patient Account Number: [id number] Date of Birth/Sex: Treating RN: 1941/06/20 (78 y.o. [name]) Dolores Lory, [place] Primary Care Provider: Jeralyn Gross Other Clinician: Referring Provider: Treating Provider/Extender:Seynabou Fults, [name], [facility] Weeks in Treatment: 3 Active Problems ICD-10 Evaluated  Encounter Code Description Active Date Today Diagnosis L97.211 Non-pressure chronic ulcer of right calf limited to 02/28/2019 No Yes breakdown of skin L97.221 Non-pressure chronic ulcer of left calf limited to 02/28/2019 No Yes breakdown of skin I87.333 Chronic venous hypertension (idiopathic) with ulcer 02/28/2019 No Yes and inflammation of bilateral lower extremity E11.51 Type 2 diabetes mellitus with diabetic peripheral 02/28/2019 No Yes angiopathy without gangrene E11.42 Type 2 diabetes mellitus with diabetic polyneuropathy 02/28/2019 No Yes L97.524 Non-pressure chronic ulcer of other part of left foot 10/10/2019 No Yes with necrosis of bone L97.511 Non-pressure chronic ulcer of other part of right foot 09/26/2019 No Yes limited to breakdown of skin S28.76 Chronic systolic (congestive) heart failure 10/05/2019 No Yes M86.672 Other chronic osteomyelitis, left ankle and foot 10/31/2019 No Yes Inactive Problems ICD-10 Code Description Active Date Inactive Date B35.3 Tinea pedis 09/05/2019 09/05/2019 L97.521 Non-pressure chronic ulcer of other part of left foot limited to 09/26/2019 09/26/2019 breakdown of skin Resolved Problems Electronic Signature(s) Signed: 01/01/2020 7:44:34 AM By: Daniel Ham MD Entered By: [name] on 12/26/2019 09:15:41 -------------------------------------------------------------------------------- Progress Note Details Patient Name: Date of Service: Daniel Pia  D. 12/26/2019 8:00 AM Medical Record [id number] Patient Account Number: [id number] Date of Birth/Sex: Treating RN: 05-Nov-1940 (78 y.o. [name]) [name] Primary Care Provider: Jeralyn Gross Other Clinician: Referring Provider: Treating Provider/Extender:Kameren Pargas, [name], [facility] Weeks in Treatment: 40 Subjective History of Present Illness (HPI) The following HPI elements were documented for the patient's wound: Location: Patient presents with a wound to left lower leg. Quality: Patient reports  No Pain. Duration: 2 months no cig or alcohol. spontaneous appearance in area of stasis dermamtitis. [name] on metformin only. chronic afib on Coumadin. diabetes and coag studies not good. hba1c 7.5. ivr 4.5. no pain or sxs of systemic disease. hx chf. no intermittent claudication 02/28/2019 Readmission This is a now a 79 year old man who was previously cared for in 2016 by Dr. Lindon Gross for wounds on his lower extremities. At that point he had venous reflux studies although I cannot seem to open these in [place] link. He had arterial studies showing an ABI of 1.11 on the right and 1.27 on the left his waveforms were triphasic bilaterally. He was discharged in stockings although I do not believe he is wearing these in some time. He tells me that about a month ago he noted openings of a large wound on the posterior right calf and 2 smaller areas on the left lateral calf and a small area more recently on the left posterior calf. He has been dressing these with peroxide and triple antibiotic ointment. He is not wearing compression. Past medical history; type 2 diabetes with peripheral neuropathy, chronic venous insufficiency, hypertension, cardiomyopathy, chronic atrial fibrillation on Coumadin, prostate cancer, hyperlipidemia, gout, ABI in our clinic was 1.34 on the left and not obtainable on the right 6/9; this is a patient who has chronic venous insufficiency. He has a fairly substantial area on the right posterior calf, left lateral calf and a small area on the left posterior calf. On arrival last week he had very palpable popliteal and femoral pulses but nothing in his bilateral feet. Unfortunately we cannot get arterial studies until July 1 at Dr. Kennon Gross office. They live in [place]. We use silver alginate under Kerlix Coban 6/16; patient with chronic venous insufficiency with wounds on his bilateral lower extremities. When he came into our clinic he was discovered to have a complete absence  of peripheral pedal pulses at either the dorsalis pedis or posterior tibial. He does have easily palpable femoral and popliteal pulses. He sees Dr. Gwenlyn Gross tomorrow for noninvasive arterial tests. He may also require venous reflux evaluation although I do not view this as an urgent thing. We have been using silver alginate. His wound surfaces of cleaned up quite nicely 6/23; patient with chronic venous insufficiency with wounds on his bilateral lower extremities. His wounds all are somewhat better looking. He did go to Dr. Kennon Gross office but somehow ended up on the doctors schedule rather than being scheduled for noninvasive tests therefore his noninvasive tests are scheduled for July 1. We agree that he has venous insufficiency ulcers but I cannot feel any pulses in his lower extremities dictating the need for test. We are only using Kerlix and light Coban unfortunately this appears to be holding the edema 6/30; has his arterial studies tomorrow. We have been using Kerlix and light Coban will go to a more aggressive compression if the arterial studies will allow. We all agreed these are venous wounds however I cannot feel pulses at either the dorsalis pedis or posterior tibial bilaterally. His wounds generally look some  better including left lateral and right posterior. 7/7-Patient returns at 1 week in Kerlix/Coban to both legs, with improvement, in the left lateral and right posterior lower leg wounds, ABI's are normal in both legs per vascular studies, TBI is also normal on both sides, we are using hydrofera blue to the wounds 7/14; patient's arterial studies from 2 weeks ago showed an ABI on the right at 1.03 with a TBI of 0.86. On the left the ABI was 1.06 with a TBI of 0.84. Notable for the fact that his arterial waveforms were monophasic in all of the lower extremity arteries suggesting some degree of arterial occlusive disease but in general this was felt to be fairly adequate for healing.  His compression was increased from 2-3 layer which is appropriate. Dressing was changed to [facility] 7/21; patient's wounds are measuring smaller. The more substantial one on the right posterior calf, second 1 on the left lateral calf. Using [facility] on both wound areas 7/28; patient continues to make nice improvements. The area on the right posterior calf is smaller. Area on the left lateral calf also is smaller. We have been using Hydrofera Blue under compression. The patient will need compression stockings and we have measured him for these in the eventuality that these heal which really should not be too long from now 8/4-Patient continues to make improvement, the right posterior calf area smaller with rim of keratotic skin on one side, the left wound is definitely smaller and improving. 8/11-Returns at 1 week, after being in 3 layer compression on both legs, both wounds appear to be improving, making good progress, patient is happy, pain is also less especially in the right leg wound 8/18; the area on the left anterior lower leg is healed. On the right posterior leg the wound remains although the dimensions are a lot better. 8/25; he arrives in clinic today with a large body of open wound on the left lateral calf. All of the 3 wounds in this area are in close juxtaposition to each other. The story is that we discharged him last week with no a wrap on the left leg. They went to [place] could not get in as they are only excepting phone orders or online orders for stockings hence they did not put any stocking on the left leg all week. They have something at home but the patient with that was either incapable or just did not put them on. Apparently these opened 1 morning after getting out of bed. The area on the right has no real change 9/1; patient has bilateral lower extremity wounds in the setting of severe chronic venous insufficiency and secondary lymphedema. He arrived last  week with new areas on the left lateral lower leg after we did not wrap him and he did not use his stockings. Nevertheless the areas on the left look better today under compression. Posterior right calf does not really changed. We are using Hydrofera Blue on both areas under compression 9/15; bilateral lower extremity wounds in the setting of severe chronic venous insufficiency and secondary lymphedema. He has 20 to 30 mmHg below-knee compression stockings under the eventuality that these close over. We did get the left leg to close but he did not transition to a stocking and this reopened. There are 2 open areas on the left posterior lateral calf and one on the right. Both of these look satisfactory. Using [facility] 9/22; bilateral lower extremity wounds in the setting of chronic venous insufficiency. 2  superficial areas on the left lateral calf. One on the right just above the Achilles area. We have good edema control we have been using Hydrofera Blue 9/29; the areas on the left lateral calf are healed. On the right just above the Achilles and tendon area things look a lot better small wounds one scabbed area. We have been using Hydrofera Blue. We can discharge him in his own stocking on the left still wrapping on the right. This is the second time we have healed the left leg but he did not put a stocking on last time. Hopefully this will maintain the edema from chronic venous disease with secondary lymphedema 10/6; he comes in today having a stocking on the left leg. They had trouble getting it on there is a lot of increase in swelling 2 small open areas one anteriorly and one on the medial calf. They report a lot of difficulty getting the stocking on. ooParadoxically the area on the right that we have been wrapping posteriorly is closed 10/13; he comes in today with wounds bilaterally including superficial areas on the left medial and left lateral calf. As well as the right posterior  has reopened in the Achilles area superiorly. He still does not have his juxta lite stockings although truthfully we would not of been able to use them today anyway. Apparently have been ordered and paid for from [employer] although they have not been delivered 10/20; his area on the right is just the boat closed on the right posterior. Still has the area on the left lateral and a very tiny area on the left medial. He has his bilateral juxta lites although he is not ready for them this week. He tolerated the increase to 4 layer compression last week quite well 10/27; the area on the right posterior calf is once again closed. He has a superficial area on the left lateral calf that is still open. He has been using Hydrofera Blue and bilateral 4-layer compression. He can change to his own juxta lite stocking on the right and we are instructing him today 11/3; the area on the right posterior calf reopened according to the patient and his wife after they took off the stocking when they got home last week. Apparently scabbed over there is now a fairly substantial wound which looks pretty much the same. Our intake nurse noted that they were using the juxta lite stockings appropriately. I was really hoping I might be able to close him out today. He has 1 very tiny remaining area on the left lateral lower leg. 11/10; right posterior calf wound measures smaller but is still open. We have been using Hydrofera Blue. On the left he has a small Daniel-shaped wound and he seems to have had another wound distally that is open and likely a blister. We are using Hydrofera Blue under compression 11/17; right posterior calf wound continues to get better. We have been using Hydrofera Blue. On the left lateral one of the wounds has closed still a small open area. We have been using Hydrofera Blue on this as well. Both areas have been under 4-layer compression Arrives in clinic today with some swelling in the dorsal foot on  the right some erythema of his forefoot and toes. Initially when I looked at this I almost thought this was a sunburn distal to a wrap injury. 12/1; right posterior calf wound debrided with a curette. We have been using Hydrofera Blue on the left anterior lateral he has an  area across the mid tibia. Finally a small area on the left lateral lower calf. Finally he continues to have de-epithelialized areas on the dorsal aspect of his toes. Initially thought this might be a burn injury when I saw him 2 weeks ago. I now wonder about tinea. I have also reviewed his arterial studies which were really quite good in July/20 with normal TBI's and ABIs but monophasic waveforms 12/8; comes in today with worsening problems especially on the left leg where he now has a cluster of wounds in the left anterior mid tibia. Very poor edema control. I reduced him to 3 layer from 4 layer compression last week because of some concern about blood flow to his toes however he does not have good edema control on the left leg. Right leg edema control looks satisfactory. ooOn the left he has a cluster of wounds anteriorly, small area on the left medial fifth met head and then the collection of areas on his toes which appear better ooOn the right he has the original area on the right posterior calf, a new area right medially. His formal arterial studies from mid July are noted below. He was evaluated by Dr.Berry ABI Findings: +---------+------------------+-----+----------+--------+ Right Rt Pressure (mmHg)IndexWaveform Comment  +---------+------------------+-----+----------+--------+ Brachial 176     +---------+------------------+-----+----------+--------+ ATA 176 0.99 monophasic  +---------+------------------+-----+----------+--------+ PTA 183 1.03 monophasic  +---------+------------------+-----+----------+--------+ PERO 172 0.97 monophasic   +---------+------------------+-----+----------+--------+ Great Toe153 0.86    +---------+------------------+-----+----------+--------+ +---------+------------------+-----+-----------+-------+ Left Lt Pressure (mmHg)IndexWaveform Comment +---------+------------------+-----+-----------+-------+ Brachial 178     +---------+------------------+-----+-----------+-------+ ATA 162 0.91 multiphasic  +---------+------------------+-----+-----------+-------+ PTA 188 1.06 multiphasic  +---------+------------------+-----+-----------+-------+ PERO 158 0.89 monophasic   +---------+------------------+-----+-----------+-------+ Great Toe150 0.84    +---------+------------------+-----+-----------+-------+ +-------+-----------+-----------+------------+------------+ ABI/TBIToday's ABIToday's TBIPrevious ABIPrevious TBI +-------+-----------+-----------+------------+------------+ Right 1.03 0.86 1.11   +-------+-----------+-----------+------------+------------+ Left 1.06 0.84 1.27   +-------+-----------+-----------+------------+------------+ Tibial waveforms somewhat difficult to record due to irregular heartbeat. Bilateral ABIs appear essentially unchanged compared to prior study on 06/21/15. Summary: Right: Resting right ankle-brachial index is within normal range. No evidence of significant right lower extremity arterial disease. The right toe-brachial index is normal. Although ankle brachial indices are within normal limits (0.95-1.29), arterial Doppler waveforms at the ankle suggest some component of arterial occlusive disease. Left: Resting left ankle-brachial index is within normal range. No evidence of significant left lower extremity arterial disease. The left toe-brachial index is normal. Although ankle brachial indices are within normal limits (0.95-1.29), arterial Doppler waveforms at the ankle suggest some component of arterial  occlusive disease. 12/15; the patient's area on the left mid tibia looks better. Right posterior calf also better. He has the area on the left foot as well. All of his toes look better I think this was tinea. We are using Hydrofera Blue everywhere else The patient was in urgent care yesterday with wheezing and shortness of breath. He got azithromycin and prednisone. He feels better. His lungs are currently clear to auscultation. He was not tested for Covid 19 12/29; the patient arrives in clinic today with quite a bit change. 2 weeks ago he only had areas on the left mid tibia right posterior calf with tinea pedis resolving between his toes. He arrives in clinic today with several areas on the dorsal toes on the right, dorsal left second toe. He has skin breakdown in the left medial calf probably from excess edema. Small area proximally in the medial calf. He has weeping edema fluid coming out of the skin excoriations on the left medial calf. He tells me that he is having a  cardiac catheterization next week. I had a quick look at [facility] health link. He was Gross to have an ejection fraction of 25% during the work-up for persistent atrial fibrillation. He saw his cardiology office yesterday seen by the nurse practitioner. She increased his carvedilol. He has not been on diuretics for apparently several months and indeed in the nurse practitioner Dietrich Pates notes she had knowledge of this. 10/04/2019 on evaluation today patient presents as a walk-in visit concerning the fact that he did not have an appointment here for our clinic at this point. He actually had a cardiac catheterization earlier today and then came from there to here in order to be evaluated. With that being said unfortunately he is having significant cellulitis of his left lower extremity upon evaluation today this appears to have deteriorated even since last week's evaluation with Dr. Dellia Nims. The right lower extremity is actually  doing okay I really see no evidence of deterioration at this point at those locations. In fact the right leg seems in general be doing quite well. Nonetheless I am concerned about infection and cellulitis of the left lower extremity and again considering his weakened heart I do not want him to develop into sepsis at all. He is also having some trouble breathing today and I understand according to nursing staff this is always the case to some degree. With that being said the patient unfortunately seems to be in my opinion a little bit worse even his wife feels like that may be the case today. Unsure exactly what is leading to this. Cardiac catheterization I did review the report which showed an ejection fraction of 25% he also had an LAD blockage of around 25% based on what I saw. With that being said there was no significant blockages that required stenting at this point he does have weakened cardiac muscles compared to normal. 10/05/2019; patient was seen yesterday in clinic. He was sent to the ER because of cellulitis of the left leg possibility. In the ER he was given 1 dose of IV Levaquin and discharged on Keflex. He came in the clinic initially for a nurse visit to rewrap his left leg. We did not look at the right leg today that is an Haematologist. The patient also had a cardiac cath. According to him there were no blockages but a very low ejection fraction. 1/12; back for an early follow-up. The condition of the left lower leg is a lot better although there are multiple open areas. All of them with not a particularly viable surface. On the right posterior calf he has a single wound with a clean surface. He has a wound with exposed bone on the PIP of the left second toe dorsally. He has wounds on the dorsal right first second and third toes. His arterial studies were normal. 1/19; the patient has 3 open wounds on the left leg anteriorly in the mid tibia, distally and medially just above the ankle  and posteriorly. On the right he has a small area dime sized on the right posterior calf. His edema control is a lot better. He still has wounds on the right second and left second toes. The left second toe has exposed bone. X- ray I did last week did not show evidence of osteomyelitis in the left foot. 1/26; patient with a multiplicity of wounds and problems. ooOn the left he has a circular area on the left anterior mid tibia ooLeft just above the medical ankle -left 2nd toe  pip -right 2nd toe right posterior calf 2/2; patient with a multiplicity of wounds and problems. He has severe chronic venous insufficiency and the wounds on his leg are all on the left left anterior left medial at the medial malleolus and left posterior. We have been using Iodoflex to these areas to help with ongoing debridement. He also has largely traumatic wounds on his toes this includes the left second with exposed bone. Bone culture I did last week showed Staph aureus which is methicillin sensitive. I discussed with him today the idea of an amputation of this toe because it is literally nonfunctional however he wants to try antibiotics. Antibiotic choice is complicated by the fact that he is on Coumadin. He also has wounds over the dorsal part of the right first which is close to closed. Right second toe has exposed bone and the right third toe at the base of the right third toe is just about closed as well. We have been using silver alginate 2/9; patient with severe chronic venous insufficiency. He has large wounds on the left anterior mid tibia and on the left medial just above the ankle. I am concerned today about the depth of the area on the left mid tibia may be exposing into the muscle. He has small areas on the left posterior calf and on the right posterior calf although these do not look as threatening. He also has traumatic wounds on his toes. The right first and third are closed however the bilateral  second toes have exposed bone. The area on the left is open into the distal interphalangeal joint. In my opinion these toes are nonsalvageable and will need amputation 2/16; patient with severe chronic venous insufficiency. He has wounds on the left anterior mid tibia left medial lower leg just above the ankle. He has small areas on the left posterior calf and a more prominent area on the right posterior calf. All of these are related to poorly controlled venous hypertension. He also had traumatic wounds on the PIPs of both second toes. Unfortunately both of these have exposed bone and in the case of the left this I think goes right into the joint itself. I do not think either 1 of these is salvageable. I have reviewed his arterial evaluation that was done in July last year. He also saw Dr. Gwenlyn Gross in June. He felt these were venous stasis. Previously had segmental arterial pressures performed in 11/03/2014 which were normal. He was sent for for a follow-up arterial evaluation on July 1. On the right his ABIs were 1.03 with a TBI of 0.86 although his waveforms were monophasic on the left he had multiphasic waveforms at the ATA PTA but monophasic at the peroneal nevertheless his ABI was normal at 1.06 TBI also normal at 0.84. I think he has enough blood flow to heal an amputation which I think he needs of the left second toe and probably the right second toe as well 2/23; the patient is going for his bilateral second toe amputation by Dr. Amalia Hailey on Friday. In the meantime is 3 areas on the left leg and the small area on the right posterior look better. We have been using silver collagen. 3/2; the patient's toe amputations were canceled because of cardiac concerns. Apparently this surgery will need to be done in the hospital although they do not have an appointment. In the meantime he is continues to have 3 areas on the left leg one anteriorly and 2 smaller areas posteriorly  on the left as well as the  small posterior area on the right. We have been using silver collagen with compression 3/9; still no word on the second toe amputations bilaterally. 3 wounds on the left leg all look better and the area on the right posterior we have been using silver collagen I change this to [facility] 3/16; amputations next Monday both second toes he is apparently going to be hospitalized. He has on his left leg including left anterior and left lateral look better very superficial also the right posterior. 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 He continues to have left leg wounds anteriorly x2 posteriorly x1 at the left lower leg just above the ankle anteriorly x1 and posteriorly on the right x1 3/30; his anterior left leg wounds are considerably smaller. In fact the only wound that is not smaller is on the right. We have been using Hydrofera Blue under Kerlix Cobax Objective Constitutional Sitting or standing Blood Pressure is within target range for patient.. Pulse regular and within target range for patient.Marland Kitchen Respirations regular, non-labored and within target range.. Temperature is normal and within the target range for the patient.Marland Kitchen Appears in no distress. Vitals Time Taken: 8:05 AM, Height: 74 in, Weight: 212 lbs, BMI: 27.2, Temperature: 97.7 F, Pulse: 72 bpm, Respiratory Rate: 20 breaths/min, Blood Pressure: 125/69 mmHg. Cardiovascular The pulses are palpable. Minimal edema.. General Notes: Wound exam; all his wounds are improved except for the area on the posterior right. This required debridement with a #3 curette. Removing tightly adherent fibrinous debris. Fortunately this cleans up quite nicely. The rest of his wounds are smaller and superficial. Integumentary (Hair, Skin) Wound #19 status is Open. Original cause of wound was Gradually Appeared. The wound is located on  the Left,Anterior Lower Leg. The wound measures 1.4cm length x 0.7cm width x 0.1cm depth; 0.77cm^2 area and 0.077cm^3 volume. There is Fat Layer (Subcutaneous Tissue) Exposed exposed. There is no tunneling or undermining noted. There is a small amount of serosanguineous drainage noted. The wound margin is flat and intact. There is large (67-100%) red, hyper - granulation within the wound bed. There is no necrotic tissue within the wound bed. Wound #24 status is Open. Original cause of wound was Gradually Appeared. The wound is located on the [facility] Lower Leg. The wound measures 1.5cm length x 0.4cm width x 0.1cm depth; 0.471cm^2 area and 0.047cm^3 volume. There is Fat Layer (Subcutaneous Tissue) Exposed exposed. There is no tunneling or undermining noted. There is a small amount of serosanguineous drainage noted. The wound margin is flat and intact. There is large (67-100%) red granulation within the wound bed. There is no necrotic tissue within the wound bed. Wound #25 status is Open. Original cause of wound was Gradually Appeared. The wound is located on the Left,Distal,Posterior Lower Leg. The wound measures 1cm length x 0.7cm width x 0.2cm depth; 0.55cm^2 area and 0.11cm^3 volume. There is Fat Layer (Subcutaneous Tissue) Exposed exposed. There is no tunneling or undermining noted. There is a small amount of serosanguineous drainage noted. The wound margin is flat and intact. There is large (67-100%) pink granulation within the wound bed. There is a small (1-33%) amount of necrotic tissue within the wound bed including Adherent Slough. Wound #3R status is Open. Original cause of wound was Gradually Appeared. The wound is located on the Right,Posterior Calf. The wound measures 0.8cm length  x 0.8cm width x 0.1cm depth; 0.503cm^2 area and 0.05cm^3 volume. There is Fat Layer (Subcutaneous Tissue) Exposed exposed. There is no tunneling or undermining noted. There is a small amount of  serosanguineous drainage noted. The wound margin is distinct with the outline attached to the wound base. There is large (67-100%) red granulation within the wound bed. There is no necrotic tissue within the wound bed. 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 There was a Excisional Skin/Subcutaneous Tissue Debridement with a total area of 0.64 sq cm performed by [name]., MD. With the following instrument(s): Curette to remove Viable and Non-Viable tissue/material. Material removed includes Subcutaneous Tissue, Slough, Skin: Dermis, and Skin: Epidermis after achieving pain control using Lidocaine 5% topical ointment. No specimens were taken. A time out was conducted at 08:40, prior to the start of the procedure. A Minimum amount of bleeding was controlled with Pressure. The procedure was tolerated well with a pain level of 0 throughout and a pain level of 0 following the procedure. Post Debridement Measurements: 0.8cm length x 0.8cm width x 0.1cm depth; 0.05cm^3 volume. Character of Wound/Ulcer Post Debridement is improved. Severity of Tissue Post Debridement is: Fat  layer exposed. Post procedure Diagnosis Wound #3R: Same as Pre-Procedure Plan Follow-up Appointments: Return Appointment in 1 week. - Tuesday **********EXTRA TIME**************** Dressing Change Frequency: Wound #19 Left,Anterior Lower Leg: Do not change entire dressing for one week. Wound #24 Left,Distal,Anterior Lower Leg: Do not change entire dressing for one week. Wound #25 Left,Distal,Posterior Lower Leg: Do not change entire dressing for one week. Wound #3R Right,Posterior Calf: Do not change entire dressing for one week. Skin Barriers/Peri-Wound Care: TCA Cream or Ointment Wound Cleansing: May shower with protection. Primary Wound Dressing: Wound #19 Left,Anterior Lower Leg: Hydrofera Blue Wound #24 Left,Distal,Anterior Lower Leg: Hydrofera Blue Wound #25 Left,Distal,Posterior Lower Leg: Hydrofera Blue Wound #3R Right,Posterior Calf: Hydrofera Blue Secondary Dressing: Dry Gauze - secure toes with tape Edema Control: Wound #19 Left,Anterior Lower Leg: Kerlix and Coban - Bilateral Wound #24 Left,Distal,Anterior Lower Leg: Kerlix and Coban - Bilateral Wound #25 Left,Distal,Posterior Lower Leg: Kerlix and Coban - Bilateral Wound #3R Right,Posterior Calf: Kerlix and Coban - Bilateral Off-Loading: Open toe surgical shoe to: - left and right foot 1. Hydrofera Blue to all wound areas 2. Still under kerlix and Coban Electronic Signature(s) Signed: 01/01/2020 7:44:34 AM By: Daniel Ham MD Entered By: [name] on 12/26/2019 09:20:06 -------------------------------------------------------------------------------- SuperBill Details Patient Name: Date of Service: [name], [name] 12/26/2019 Medical Record [id number] Patient Account Number: [id number] Date of Birth/Sex: Treating RN: July 09, 1941 (78 y.o. [name]) [name] Primary Care Provider: Jeralyn Gross Other Clinician: Referring Provider: Treating Provider/Extender:Chaselyn Nanney, [name], [facility] Weeks in  Treatment: 11 Diagnosis Coding ICD-10 Codes Code Description L97.211 Non-pressure chronic ulcer of right calf limited to breakdown of skin L97.221 Non-pressure chronic  ulcer of left calf limited to breakdown of skin I87.333 Chronic venous hypertension (idiopathic) with ulcer and inflammation of bilateral lower extremity E11.51 Type 2 diabetes mellitus with diabetic peripheral angiopathy without gangrene E11.42 Type 2 diabetes mellitus with diabetic polyneuropathy L97.524 Non-pressure chronic ulcer of other part of left foot with necrosis of bone L97.511 Non-pressure chronic ulcer of other part of right foot limited to breakdown of skin K98.28 Chronic systolic (congestive) heart failure C75.198 Other chronic osteomyelitis, left ankle and foot Facility Procedures Physician Procedures CPT4 Code Description: [id number] - WC PHYS SUBQ TISS 20 SQ CM ICD-10 Diagnosis Description L97.211 Non-pressure chronic ulcer of right calf limited to br Modifier: eakdown of skin Quantity: 1 Electronic Signature(s) Signed: 01/01/2020 7:44:34 AM By: Daniel Ham MD Entered By: [name] on 12/26/2019 09:20:43

## 2020-01-01 NOTE — Progress Notes (Signed)
[name], [name] ([id number]) Visit Report for 12/26/2019 Arrival Information Details Patient Name: Date of Service: [name], [name] 12/26/2019 8:00 AM Medical Record [place] Patient Account Number: [id number] Date of Birth/Sex: Treating RN: May 27, 1941 (79 y.o. [name] Primary Care Daevon Holdren: Jeralyn Ruths Other Clinician: Referring Roniya Tetro: Treating Khari Mally/Extender:Robson, Donneta Romberg, [facility] Weeks in Treatment: 51 Visit Information History Since Last Visit Added or deleted any medications: No Patient Arrived: Wheel Chair Any new allergies or adverse reactions: No Arrival Time: 08:04 Had a fall or experienced change in No Accompanied By: wife activities of daily living that may affect Transfer Assistance: None risk of falls: Patient Identification Verified: Yes Signs or symptoms of abuse/neglect since last No Secondary Verification Process Yes visito Completed: Hospitalized since last visit: No Patient Requires Transmission- No Implantable device outside of the clinic excluding No Based Precautions: cellular tissue based products placed in the center Patient Has Alerts: Yes since last visit: Patient Alerts: Patient on Blood Has Dressing in Place as Prescribed: Yes Thinner Pain Present Now: No Electronic Signature(s) Signed: 01/01/2020 4:55:48 PM By: Kela Millin Entered By: Kela Millin on 12/26/2019 08:04:46 -------------------------------------------------------------------------------- Encounter Discharge Information Details Patient Name: Date of Service: Daniel Pia D. 12/26/2019 8:00 AM Medical Record [id number] Patient Account Number: [id number] Date of Birth/Sex: Treating RN: Jul 12, 1941 (79 y.o. [name] Primary Care Reise Hietala: Jeralyn Ruths Other Clinician: Referring Sukhman Martine: Treating Charnae Lill/Extender:Robson, Donneta Romberg, [facility] Weeks in Treatment: 73 Encounter Discharge Information Items Post Procedure  Vitals Discharge Condition: Stable Temperature (F): 97.7 Ambulatory Status: Wheelchair Pulse (bpm): 72 Discharge Destination: Home Respiratory Rate (breaths/min): 20 Transportation: Private Auto Blood Pressure (mmHg): 125/69 Accompanied By: wife Schedule Follow-up Appointment: Yes Clinical Summary of Care: Patient Declined Electronic Signature(s) Signed: 01/01/2020 4:55:48 PM By: Kela Millin Entered By: Kela Millin on 12/26/2019 09:00:36 -------------------------------------------------------------------------------- Lower Extremity Assessment Details Patient Name: Date of Service: [name], [name] 12/26/2019 8:00 AM Medical Record [id number] Patient Account Number: [id number] Date of Birth/Sex: Treating RN: December 27, 1940 (79 y.o. [name] Primary Care Jadaya Sommerfield: Jeralyn Ruths Other Clinician: Referring Liberty Stead: Treating Marquis Down/Extender:Robson, Donneta Romberg, [facility] Weeks in Treatment: 42 Edema Assessment Assessed: [Left: Yes] [Right: Yes] Edema: [Left: No] [Right: No] Calf Left: Right: Point of Measurement: 31 cm From Medial Instep 32 cm 31.5 cm Ankle Left: Right: Point of Measurement: 11 cm From Medial Instep 21 cm 21 cm Vascular Assessment Pulses: Dorsalis Pedis Palpable: [Left:Yes] [Right:Yes] Electronic Signature(s) Signed: 12/27/2019 5:08:19 PM By: Deon Pilling Entered By: Deon Pilling on 12/26/2019 08:16:25 -------------------------------------------------------------------------------- Multi Wound Chart Details Patient Name: Date of Service: Daniel Pia D. 12/26/2019 8:00 AM Medical Record [id number] Patient Account Number: [id number] Date of Birth/Sex: Treating RN: June 01, 1941 (78 y.o. [name]) [name] Primary Care Carlson Belland: Jeralyn Ruths Other Clinician: Referring Carver Murakami: Treating Harbor Paster/Extender:Robson, Donneta Romberg, [name] Weeks in Treatment: 41 Vital Signs Height(in): 74 Pulse(bpm): 72 Weight(lbs): 212 Blood  Pressure(mmHg): 125/69 Body Mass Index(BMI): 27 Temperature(F): 97.7 Respiratory 20 Rate(breaths/min): Photos: [19:No Photos] [24:No Photos] [25:No Photos] Wound Location: [19:Left Lower Leg - Anterior] [24:Left Lower Leg - Anterior, Left Lower Leg - Posterior, Distal] [25:Distal] Wounding Event: [19:Gradually Appeared] [24:Gradually Appeared] [25:Gradually Appeared] Primary Etiology: [19:Venous Leg Ulcer] [24:Venous Leg Ulcer] [25:Venous Leg Ulcer] Secondary Etiology: [19:N/A] [24:Diabetic Wound/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] [25:Lower Extremity] Comorbid History: [19:Cataracts, Hypertension, Cataracts, Hypertension, Cataracts, Hypertension, Peripheral Venous Disease, Peripheral Venous Disease, Peripheral Venous Disease, Type II Diabetes, Gout, Received Radiation] [24:Type II Diabetes, Gout,  Received Radiation] [25:Type II Diabetes, Gout, Received Radiation] Date Acquired: [19:09/23/2019] [24:10/04/2019] [25:10/04/2019]  Weeks of Treatment: [19:13] [24:11] [25:11] Wound Status: [19:Open] [24:Open] [25:Open] Wound Recurrence: [19:No] [24:No] [25:No] Clustered Wound: [19:No] [24:Yes] [25:Yes] Clustered Quantity: [19:1] [24:1] [25:1] Measurements L x W x D 1.4x0.7x0.1 [24:1.5x0.4x0.1] [25:1x0.7x0.2] (cm) Area (cm) : [19:0.77] [24:0.471] [25:0.55] Volume (cm) : [19:0.077] [24:0.047] [25:0.11] % Reduction in Area: [19:98.60%] [24:92.60%] [25:97.80%] % Reduction in Volume: 98.60% [24:92.60%] [25:95.60%] Classification: [19:Full Thickness Without Exposed Support Structures Exposed Support Structures Exposed Support Structures] [24:Full Thickness Without] [25:Full Thickness Without] Exudate Amount: [19:Small] [24:Small] [25:Small] Exudate Type: [19:Serosanguineous] [24:Serosanguineous] [25:Serosanguineous] Exudate Color: [19:red, brown] [24:red, brown] [25:red, brown] Wound Margin: [19:Flat and Intact] [24:Flat and Intact] [25:Flat and Intact] Granulation Amount:  [19:Large (67-100%)] [24:Large (67-100%)] [25:Large (67-100%)] Granulation Quality: [19:Red, Hyper-granulation] [24:Red] [25:Pink] Necrotic Amount: [19:None Present (0%)] [24:None Present (0%)] [25:Small (1-33%)] Exposed Structures: [19:Fat Layer (Subcutaneous Fat Layer (Subcutaneous Fat Layer (Subcutaneous Tissue) Exposed: Yes Fascia: No Tendon: No Muscle: No Joint: No Bone: No] [24:Tissue) Exposed: Yes Fascia: No Tendon: No Muscle: No Joint: No Bone: No] [25:Tissue) Exposed: Yes  Fascia: No Tendon: No Muscle: No Joint: No Bone: No] Epithelialization: [19:Small (1-33%)] [24:Medium (34-66%)] [25:Small (1-33%)] Debridement: [19:N/A] [24:N/A] [25:N/A] Pain Control: [19:N/A] [24:N/A] [25:N/A] Tissue Debrided: [19:N/A] [24:N/A] [25:N/A] Level: [19:N/A] [24:N/A] [25:N/A] Debridement Area (sq cm):N/A [24:N/A] [25:N/A] Instrument: [19:N/A] [24:N/A] [25:N/A] Bleeding: [19:N/A] [24:N/A N/A] Hemostasis Achieved: [19:N/A] [24:N/A N/A] Procedural Pain: [19:N/A] [24:N/A N/A] Post Procedural Pain: [19:N/A] [24:N/A N/A] Debridement Treatment [19:N/A] [24:N/A N/A] Response: Post Debridement [19:N/A] [24:N/A N/A] Measurements L x W x D (cm) Post Debridement [19:N/A] [24:N/A N/A] Volume: (cm) Procedures Performed: [19:N/A 3R] [24:N/A N/A N/A] [25:N/A] Photos: [19:No Photos] [24:N/A N/A] Wound Location: [19:Right Calf - Posterior] [24:N/A N/A] Wounding Event: [19:Gradually Appeared] [24:N/A N/A] Primary Etiology: [19:Diabetic Wound/Ulcer of the N/A Lower Extremity] [24:N/A] Secondary Etiology: [19:N/A] [24:N/A N/A] Comorbid History: [19:Cataracts, Hypertension, N/A Peripheral Venous Disease, Type II Diabetes, Gout, Received Radiation] [24:N/A] Date Acquired: [19:02/28/2019] [24:N/A N/A] Weeks of Treatment: [19:43] [24:N/A N/A] Wound Status: [19:Open] [24:N/A N/A] Wound Recurrence: [19:Yes] [24:N/A N/A] Clustered Wound: [19:No] [24:N/A N/A] Clustered Quantity: [19:N/A] [24:N/A N/A] Measurements L x W  x D 0.8x0.8x0.1 [24:N/A N/A] (cm) Area (cm) : [19:0.503] [24:N/A] [25:N/A] Volume (cm) : [19:0.05] [24:N/A] [25:N/A] % Reduction in Area: [19:97.90%] [24:N/A N/A] % Reduction in Volume: 98.00% [24:N/A N/A] Classification: [19:Grade 2] [24:N/A N/A] Exudate Amount: [19:Small] [24:N/A N/A] Exudate Type: [19:Serosanguineous] [24:N/A N/A] Exudate Color: [19:red, brown] [24:N/A N/A] Wound Margin: [19:Distinct, outline attached N/A] [24:N/A] Granulation Amount: [19:Large (67-100%)] [24:N/A N/A] Granulation Quality: [19:Red] [24:N/A N/A] Necrotic Amount: [19:None Present (0%)] [24:N/A N/A] Exposed Structures: [19:Fat Layer (Subcutaneous N/A Tissue) Exposed: Yes Fascia: No Tendon: No Muscle: No Joint: No Bone: No] [24:N/A] Epithelialization: [19:Large (67-100%)] [24:N/A N/A] Debridement: [19:Debridement - Excisional N/A] [24:N/A] Pre-procedure [19:08:40] [24:N/A N/A] Verification/Time Out Taken: Pain Control: [19:Lidocaine 5% topical ointment] [24:N/A N/A] Tissue Debrided: [19:Subcutaneous, Slough] [24:N/A N/A] Level: [19:Skin/Subcutaneous Tissue N/A] [24:N/A] Debridement Area (sq cm):0.64 [24:N/A N/A] Instrument: [19:Curette] [24:N/A N/A] Bleeding: [19:Minimum] [24:N/A N/A] Hemostasis Achieved: [19:Pressure] [24:N/A N/A] Procedural Pain: [19:0] [24:N/A N/A] Post Procedural Pain: [19:0] [24:N/A N/A] Debridement Treatment [19:Procedure was tolerated] [24:N/A N/A] Response: [19:well] Post Debridement [19:0.8x0.8x0.1] [24:N/A N/A] Measurements L x W x D (cm) Post Debridement [19:0.05] [24:N/A N/A] Volume: (cm) Procedures Performed: [19:Debridement] [24:N/A N/A] Treatment Notes Wound #19 (Left, Anterior Lower Leg) 1. Cleanse With Wound Cleanser Soap and water 2. Periwound Care TCA Cream 3. Primary Dressing Applied Hydrofera Blue 4. Secondary Dressing Dry Gauze 6. Support Layer Applied Kerlix/Coban Wound #24 (Left, Distal, Anterior Lower  Leg) 1. Cleanse With Wound  Cleanser Soap and water 2. Periwound Care TCA Cream 3. Primary Dressing Applied Hydrofera Blue 4. Secondary Dressing Dry Gauze 6. Support Layer Applied Kerlix/Coban Wound #25 (Left, Distal, Posterior Lower Leg) 1. Cleanse With Wound Cleanser Soap and water 2. Periwound Care TCA Cream 3. Primary Dressing Applied Hydrofera Blue 4. Secondary Dressing Dry Gauze 6. Support Layer Applied Kerlix/Coban Wound #3R (Right, Posterior Calf) 1. Cleanse With Wound Cleanser Soap and water 2. Periwound Care TCA Cream 3. Primary Dressing Applied Hydrofera Blue 4. Secondary Dressing Dry Gauze 6. Support Layer Holiday representative) Signed: 12/27/2019 5:40:01 PM By: [name] RN Signed: 01/01/2020 7:44:34 AM By: [name] MD Entered By: [name] on 12/26/2019 09:16:02 -------------------------------------------------------------------------------- Multi-Disciplinary Care Plan Details Patient Name: Date of Service: Daniel Pia D. 12/26/2019 8:00 AM Medical Record [id number] Patient Account Number: [id number] Date of Birth/Sex: Treating RN: December 26, 1940 (78 y.o. [name]) [name] Primary Care Provider: Jeralyn Ruths Other Clinician: Referring Provider: Treating Provider/Extender:Robson, Donneta Romberg, [name] Weeks in Treatment: 52 Active Inactive Wound/Skin Impairment Nursing Diagnoses: Knowledge deficit related to ulceration/compromised skin integrity Goals: Patient/caregiver will verbalize understanding of skin care regimen Date Initiated: 02/28/2019 Target Resolution Date: 12/29/2019 Goal Status: Active Ulcer/skin breakdown will have a volume reduction of 30% by week 4 Date Initiated: 02/28/2019 Date Inactivated: 04/04/2019 Target Resolution Date: 03/31/2019 Goal Status: Met Ulcer/skin breakdown will have a volume reduction of 50% by week 8 Date Initiated: 04/04/2019 Date Inactivated: 05/09/2019 Target Resolution Date: 05/05/2019 Goal Status:  Met Ulcer/skin breakdown will have a volume reduction of 80% by week 12 Date Initiated: 05/09/2019 Date Inactivated: 06/13/2019 Target Resolution Date: 06/09/2019 Unmet Goal Status: Unmet Reason: comorbities/new wounds Ulcer/skin breakdown will heal within 14 weeks Date Initiated: 06/13/2019 Date Inactivated: 07/11/2019 Target Resolution Date: 07/07/2019 Unmet Unmet Goal Status: Unmet Reason: comorbityies Interventions: Assess patient/caregiver ability to obtain necessary supplies Assess patient/caregiver ability to perform ulcer/skin care regimen upon admission and as needed Assess ulceration(s) every visit Notes: Electronic Signature(s) Signed: 12/27/2019 5:40:01 PM By: [name] RN Entered By: [name] on 12/26/2019 07:59:10 -------------------------------------------------------------------------------- Pain Assessment Details Patient Name: Date of Service: MICHOLAS, DRUMWRIGHT 12/26/2019 8:00 AM Medical Record [id number] Patient Account Number: [id number] Date of Birth/Sex: Treating RN: March 07, 1941 (79 y.o. [name] Primary Care Provider: Jeralyn Ruths Other Clinician: Referring Provider: Treating Provider/Extender:Robson, Donneta Romberg, [facility] Weeks in Treatment: 71 Active Problems Location of Pain Severity and Description of Pain Patient Has Paino No Site Locations Rate the pain. Current Pain Level: 0 Pain Management and Medication Current Pain Management: Medication: No Cold Application: No Rest: No Massage: No Activity: No T.E.N.S.: No Heat Application: No Leg drop or elevation: No Is the Current Pain Management Adequate: Adequate How does your wound impact your activities of daily livingo Sleep: No Bathing: No Appetite: No Relationship With Others: No Bladder Continence: No Emotions: No Bowel Continence: No Work: No Toileting: No Drive: No Dressing: No Hobbies: No Electronic Signature(s) Signed: 12/27/2019 5:08:19 PM By: Deon Pilling Entered By: Deon Pilling on 12/26/2019 08:16:08 -------------------------------------------------------------------------------- Patient/Caregiver Education Details Patient Name: Date of Service: [name], Daniel D. 3/30/2021andnbsp8:00 AM Medical Record [id number] Patient Account Number: [id number] Date of Birth/Gender: Treating RN: 1940-12-21 (78 y.o. [name] Primary Care Physician: Jeralyn Ruths Other Clinician: Referring Physician: Treating Physician/Extender:Robson, Donneta Romberg, [facility] Weeks in Treatment: 29 Education Assessment Education Provided To: Patient Education Topics Provided Wound/Skin Impairment: Methods: Explain/Verbal Responses: State content correctly Electronic Signature(s) Signed: 12/27/2019 5:40:01 PM By: [name] RN Entered By:  [name] on 12/26/2019 08:00:25 -------------------------------------------------------------------------------- Wound Assessment Details Patient Name: Date of Service: TRUSTEN, HUME 12/26/2019 8:00 AM Medical Record Number:[id number] Patient Account Number: [id number] Date of Birth/Sex: Treating RN: [birth year] (79 y.o. [name] Primary Care Provider: Jeralyn Ruths Other Clinician: Referring Provider: Treating Provider/Extender:Robson, Donneta Romberg, [facility] Weeks in Treatment: 7 Wound Status Wound Number: 19 Primary Venous Leg Ulcer Etiology: Etiology: Wound Location: Left Lower Leg - Anterior Wound Open Wounding Event: Gradually Appeared Status: Date Acquired: 09/23/2019 Comorbid Cataracts, Hypertension, Peripheral Venous Weeks Of Treatment: 13 History: Disease, Type II Diabetes, Gout, Received Clustered Wound: No Radiation Photos Photo Uploaded By: Mikeal Hawthorne on 12/29/2019 11:43:59 Wound Measurements Length: (cm) 1.4 % Reduct Width: (cm) 0.7 % Reduct Depth: (cm) 0.1 Epitheli Clustered Quantity: 1 Tunnelin Area: (cm) 0.77 Undermi Volume: (cm) 0.077 Wound  Description Classification: Full Thickness Without Exposed Support Foul Odo Structures Slough/F Wound Flat and Intact Margin: Exudate Small Amount: Exudate Serosanguineous Type: Exudate red, brown Color: Wound Bed Granulation Amount: Large (67-100%) Granulation Quality: Red, Hyper-granulation Fascia E Necrotic Amount: None Present (0%) Fat Laye Tendon E Muscle E Joint Ex Bone Exp r After Cleansing: No ibrino No Exposed Structure xposed: No r (Subcutaneous Tissue) Exposed: Yes xposed: No xposed: No posed: No osed: No ion in Area: 98.6% ion in Volume: 98.6% alization: Small (1-33%) g: No ning: No Treatment Notes Wound #19 (Left, Anterior Lower Leg) 1. Cleanse With Wound Cleanser Soap and water 2. Periwound Care TCA Cream 3. Primary Dressing Applied Hydrofera Blue 4. Secondary Dressing Dry Gauze 6. Support Layer Holiday representative) Signed: 12/27/2019 5:08:19 PM By: Deon Pilling Entered By: Deon Pilling on 12/26/2019 08:17:58 -------------------------------------------------------------------------------- Wound Assessment Details Patient Name: Date of Service: [name], [name] 12/26/2019 8:00 AM Medical Record [id number] Patient Account Number: [id number] Date of Birth/Sex: Treating RN: 1941-02-24 (79 y.o. [name] Primary Care Provider: Jeralyn Ruths Other Clinician: Referring Provider: Treating Provider/Extender:Robson, Donneta Romberg, [facility] Weeks in Treatment: 23 Wound Status Wound Number: 24 Primary Venous Leg Ulcer Etiology: Wound Location: Left Lower Leg - Anterior, Distal Secondary Diabetic Wound/Ulcer of the Lower Extremity Wounding Event: Gradually Appeared Etiology: Date Acquired: 10/04/2019 Wound Open Weeks Of Treatment: 11 Status: Clustered Wound: Yes Comorbid Cataracts, Hypertension, Peripheral Venous History: Disease, Type II Diabetes, Gout, Received Radiation Photos Photo Uploaded By: Mikeal Hawthorne on 12/29/2019 11:44:00 Wound Measurements Length: (cm) 1.5 % Reduct Width: (cm) 0.4 % Reduct Depth: (cm) 0.1 Epitheli Clustered Quantity: 1 Tunnelin Area: (cm) 0.471 Undermi Volume: (cm) 0.047 Wound Description Full Thickness Without Exposed Support Foul Odo Classification: Structures Slough/F Wound Flat and Intact Margin: Exudate Small Amount: Exudate Serosanguineous Type: Exudate red, brown Color: Wound Bed Granulation Amount: Large (67-100%) Granulation Quality: Red Fascia E Necrotic Amount: None Present (0%) Fat Laye Tendon E Muscle E Joint Ex Bone Exp r After Cleansing: No ibrino No Exposed Structure xposed: No r (Subcutaneous Tissue) Exposed: Yes xposed: No xposed: No posed: No osed: No ion in Area: 92.6% ion in Volume: 92.6% alization: Medium (34-66%) g: No ning: No Treatment Notes Wound #24 (Left, Distal, Anterior Lower Leg) 1. Cleanse With Wound Cleanser Soap and water 2. Periwound Care TCA Cream 3. Primary Dressing Applied Hydrofera Blue 4. Secondary Dressing Dry Gauze 6. Support Layer Holiday representative) Signed: 12/27/2019 5:08:19 PM By: Deon Pilling Entered By: Deon Pilling on 12/26/2019 08:19:19 -------------------------------------------------------------------------------- Wound Assessment Details Patient Name: Date of Service: [name], [name] 12/26/2019 8:00 AM Medical Record [id number] Patient Account Number: [id number] Date of Birth/Sex:  Treating RN: Apr 27, 1941 (79 y.o. [name] Primary Care Milagro Belmares: Jeralyn Ruths Other Clinician: Referring Airica Schwartzkopf: Treating Dontavious Emily/Extender:Robson, Donneta Romberg, [facility] Weeks in Treatment: 33 Wound Status Wound Number: 25 Primary Venous Leg Ulcer Etiology: Wound Location: Left Lower Leg - Posterior, Distal Secondary Diabetic Wound/Ulcer of the Lower Extremity Wounding Event: Gradually Appeared Etiology: Date Acquired: 10/04/2019 Wound  Open Weeks Of Treatment: 11 Status: Clustered Wound: Yes Comorbid Cataracts, Hypertension, Peripheral Venous History: Disease, Type II Diabetes, Gout, Received Radiation Photos Photo Uploaded By: Mikeal Hawthorne on 12/29/2019 11:44:29 Wound Measurements Length: (cm) 1 % Reduct Width: (cm) 0.7 % Reduct Depth: (cm) 0.2 Epitheli Clustered Quantity: 1 Tunnelin Area: (cm) 0.55 Undermi Volume: (cm) 0.11 Wound Description Classification: Full Thickness Without Exposed Support Foul Odo Structures Slough/F Wound Flat and Intact Margin: Exudate Small Amount: Exudate Serosanguineous Type: Exudate red, brown Color: Wound Bed Granulation Amount: Large (67-100%) Granulation Quality: Pink Fascia E Necrotic Amount: Small (1-33%) Fat Laye Necrotic Quality: Adherent Slough Tendon E Muscle E Joint Ex Bone Exp r After Cleansing: No ibrino Yes Exposed Structure xposed: No r (Subcutaneous Tissue) Exposed: Yes xposed: No xposed: No posed: No osed: No ion in Area: 97.8% ion in Volume: 95.6% alization: Small (1-33%) g: No ning: No Treatment Notes Wound #25 (Left, Distal, Posterior Lower Leg) 1. Cleanse With Wound Cleanser Soap and water 2. Periwound Care TCA Cream 3. Primary Dressing Applied Hydrofera Blue 4. Secondary Dressing Dry Gauze 6. Support Layer Holiday representative) Signed: 12/27/2019 5:08:19 PM By: Deon Pilling Entered By: Deon Pilling on 12/26/2019 08:16:55 -------------------------------------------------------------------------------- Wound Assessment Details Patient Name: Date of Service: [name], [name] 12/26/2019 8:00 AM Medical Record [id number] Patient Account Number: [id number] Date of Birth/Sex: Treating RN: March 20, 1941 (79 y.o. [name] Primary Care Presten Joost: Jeralyn Ruths Other Clinician: Referring Sehaj Mcenroe: Treating Laritza Vokes/Extender:Robson, Donneta Romberg, [facility] Weeks in Treatment: 41 Wound Status Wound  Number: [id number] Primary Diabetic Wound/Ulcer of the Lower Extremity Etiology: Wound Location: Right Calf - Posterior Wound Open Wounding Event: Gradually Appeared Status: Date Acquired: 02/28/2019 Comorbid Cataracts, Hypertension, Peripheral Venous Weeks Of Treatment: 43 History: Disease, Type II Diabetes, Gout, Received Clustered Wound: No Radiation Photos Photo Uploaded By: Mikeal Hawthorne on 12/29/2019 11:44:29 Wound Measurements Length: (cm) 0.8 Width: (cm) 0.8 Depth: (cm) 0.1 Area: (cm) 0.503 Volume: (cm) 0.05 Wound Description Classification: Grade 2 Wound Margin: Distinct, outline attached Exudate Amount: Small Exudate Type: Serosanguineous Exudate Color: red, brown Wound Bed Granulation Amount: Large (67-100%) Granulation Quality: Red Necrotic Amount: None Present (0%) After Cleansing: No brino No Exposed Structure posed: No (Subcutaneous Tissue) Exposed: Yes posed: No posed: No sed: No ed: No % Reduction in Area: 97.9% % Reduction in Volume: 98% Epithelialization: Large (67-100%) Tunneling: No Undermining: No Foul Odor Slough/Fi Fascia Ex Fat Layer Tendon Ex Muscle Ex Joint Expo Bone Expos Treatment Notes Wound #3R (Right, Posterior Calf) 1. Cleanse With Wound Cleanser Soap and water 2. Periwound Care TCA Cream 3. Primary Dressing Applied Hydrofera Blue 4. Secondary Dressing Dry Gauze 6. Support Layer Holiday representative) Signed: 12/27/2019 5:08:19 PM By: Deon Pilling Entered By: Deon Pilling on 12/26/2019 08:17:24 -------------------------------------------------------------------------------- Vitals Details Patient Name: Date of Service: Daniel Pia D. 12/26/2019 8:00 AM Medical Record [id number] Patient Account Number: [id number] Date of Birth/Sex: Treating RN: 1941-07-05 (79 y.o. [name] Primary Care Iyani Dresner: Jeralyn Ruths Other Clinician: Referring Kadiatou Oplinger: Treating Kade Rickels/Extender:Robson,  Donneta Romberg, [facility] Weeks in Treatment: 11 Vital Signs Time Taken: 08:05 Temperature (F): 97.7 Height (in): 74 Pulse (bpm): 72 Weight (lbs):  212 Respiratory Rate (breaths/min): 20 Body Mass Index (BMI): 27.2 Blood Pressure (mmHg): 125/69 Reference Range: 80 - 120 mg / dl Electronic Signature(s) Signed: 12/27/2019 5:08:19 PM By: Deon Pilling Entered By: Deon Pilling on 12/26/2019 08:16:00

## 2020-01-02 ENCOUNTER — Encounter (HOSPITAL_BASED_OUTPATIENT_CLINIC_OR_DEPARTMENT_OTHER): Payer: Medicare HMO | Attending: Internal Medicine | Admitting: Internal Medicine

## 2020-01-02 DIAGNOSIS — M86672 Other chronic osteomyelitis, left ankle and foot: Secondary | ICD-10-CM | POA: Insufficient documentation

## 2020-01-02 DIAGNOSIS — L97524 Non-pressure chronic ulcer of other part of left foot with necrosis of bone: Secondary | ICD-10-CM | POA: Insufficient documentation

## 2020-01-02 DIAGNOSIS — I87333 Chronic venous hypertension (idiopathic) with ulcer and inflammation of bilateral lower extremity: Secondary | ICD-10-CM | POA: Diagnosis not present

## 2020-01-02 DIAGNOSIS — E1151 Type 2 diabetes mellitus with diabetic peripheral angiopathy without gangrene: Secondary | ICD-10-CM | POA: Insufficient documentation

## 2020-01-02 DIAGNOSIS — L97511 Non-pressure chronic ulcer of other part of right foot limited to breakdown of skin: Secondary | ICD-10-CM | POA: Diagnosis not present

## 2020-01-02 DIAGNOSIS — E1142 Type 2 diabetes mellitus with diabetic polyneuropathy: Secondary | ICD-10-CM | POA: Insufficient documentation

## 2020-01-02 DIAGNOSIS — L97211 Non-pressure chronic ulcer of right calf limited to breakdown of skin: Secondary | ICD-10-CM | POA: Insufficient documentation

## 2020-01-02 DIAGNOSIS — L97221 Non-pressure chronic ulcer of left calf limited to breakdown of skin: Secondary | ICD-10-CM | POA: Diagnosis not present

## 2020-01-02 DIAGNOSIS — I110 Hypertensive heart disease with heart failure: Secondary | ICD-10-CM | POA: Insufficient documentation

## 2020-01-02 DIAGNOSIS — E11622 Type 2 diabetes mellitus with other skin ulcer: Secondary | ICD-10-CM | POA: Insufficient documentation

## 2020-01-03 ENCOUNTER — Other Ambulatory Visit: Payer: Self-pay

## 2020-01-03 ENCOUNTER — Ambulatory Visit (HOSPITAL_COMMUNITY)
Admission: RE | Admit: 2020-01-03 | Discharge: 2020-01-03 | Disposition: A | Discharge status: Home / Self Care | Payer: Medicare HMO | Source: Ambulatory Visit | Attending: Hematology | Admitting: Hematology

## 2020-01-03 ENCOUNTER — Ambulatory Visit (INDEPENDENT_AMBULATORY_CARE_PROVIDER_SITE_OTHER): Payer: Medicare HMO | Admitting: *Deleted

## 2020-01-03 DIAGNOSIS — I5022 Chronic systolic (congestive) heart failure: Secondary | ICD-10-CM

## 2020-01-03 DIAGNOSIS — Z5181 Encounter for therapeutic drug level monitoring: Secondary | ICD-10-CM | POA: Diagnosis not present

## 2020-01-03 DIAGNOSIS — I4891 Unspecified atrial fibrillation: Secondary | ICD-10-CM

## 2020-01-03 LAB — POCT INR: INR: 1.8 — AB (ref 2.0–3.0)

## 2020-01-03 NOTE — Progress Notes (Signed)
[name], [name] ([id number]) Visit Report for 01/02/2020 HPI Details Patient Name: Date of Service: [name] 01/02/2020 8:00 AM Medical Record [place] Patient Account Number: [id number] Date of Birth/Sex: Treating RN: 08/08/1941 (79 y.o. [name]) [name] Primary Care Provider: Jeralyn Ruths Other Clinician: Referring Provider: Treating Provider/Extender:[name], [name], [facility] Weeks in Treatment: 93 History of Present Illness Location: Patient presents with a wound to left lower leg. Quality: Patient reports No Pain. Duration: 2 months HPI Description: no cig or alcohol. spontaneous appearance in area of stasis dermamtitis. [name] on metformin only. chronic afib on Coumadin. diabetes and coag studies not good. hba1c 7.5. ivr 4.5. no pain or sxs of systemic disease. hx chf. no intermittent claudication 02/28/2019 Readmission This is a now a 79 year old man who was previously cared for in 2016 by Dr. Lindon Romp for wounds on his lower extremities. At that point he had venous reflux studies although I cannot seem to open these in [place] [facility]. He had arterial studies showing an ABI of 1.11 on the right and 1.27 on the left his waveforms were triphasic bilaterally. He was discharged in stockings although I do not believe he is wearing these in some time. He tells me that about a month ago he noted openings of a large wound on the posterior right calf and 2 smaller areas on the left lateral calf and a small area more recently on the left posterior calf. He has been dressing these with peroxide and triple antibiotic ointment. He is not wearing compression. Past medical history; type 2 diabetes with peripheral neuropathy, chronic venous insufficiency, hypertension, cardiomyopathy, chronic atrial fibrillation on Coumadin, prostate cancer, hyperlipidemia, gout, ABI in our clinic was 1.34 on the left and not obtainable on the right 6/9; this is a patient who has chronic venous  insufficiency. He has a fairly substantial area on the right posterior calf, left lateral calf and a small area on the left posterior calf. On arrival last week he had very palpable popliteal and femoral pulses but nothing in his bilateral feet. Unfortunately we cannot get arterial studies until July 1 at Dr. Kennon Holter office. They live in [place]. We use silver alginate under Kerlix Coban 6/16; patient with chronic venous insufficiency with wounds on his bilateral lower extremities. When he came into our clinic he was discovered to have a complete absence of peripheral pedal pulses at either the dorsalis pedis or posterior tibial. He does have easily palpable femoral and popliteal pulses. He sees Dr. [name] tomorrow for noninvasive arterial tests. He may also require venous reflux evaluation although I do not view this as an urgent thing. We have been using silver alginate. His wound surfaces of cleaned up quite nicely 6/23; patient with chronic venous insufficiency with wounds on his bilateral lower extremities. His wounds all are somewhat better looking. He did go to Dr. Kennon Holter office but somehow ended up on the doctors schedule rather than being scheduled for noninvasive tests therefore his noninvasive tests are scheduled for July 1. We agree that he has venous insufficiency ulcers but I cannot feel any pulses in his lower extremities dictating the need for test. We are only using Kerlix and light Coban unfortunately this appears to be holding the edema 6/30; has his arterial studies tomorrow. We have been using Kerlix and light Coban will go to a more aggressive compression if the arterial studies will allow. We all agreed these are venous wounds however I cannot feel pulses at either the dorsalis pedis or posterior tibial  bilaterally. His wounds generally look some better including left lateral and right posterior. 7/7-Patient returns at 1 week in Kerlix/Coban to both legs, with improvement,  in the left lateral and right posterior lower leg wounds, ABI's are normal in both legs per vascular studies, TBI is also normal on both sides, we are using hydrofera blue to the wounds 7/14; patient's arterial studies from 2 weeks ago showed an ABI on the right at 1.03 with a TBI of 0.86. On the left the ABI was 1.06 with a TBI of 0.84. Notable for the fact that his arterial waveforms were monophasic in all of the lower extremity arteries suggesting some degree of arterial occlusive disease but in general this was felt to be fairly adequate for healing. His compression was increased from 2-3 layer which is appropriate. Dressing was changed to Hydrofera Blue 7/21; patient's wounds are measuring smaller. The more substantial one on the right posterior calf, second 1 on the left lateral calf. Using Hydrofera Blue on both wound areas 7/28; patient continues to make nice improvements. The area on the right posterior calf is smaller. Area on the left lateral calf also is smaller. We have been using Hydrofera Blue under compression. The patient will need compression stockings and we have measured him for these in the eventuality that these heal which really should not be too long from now 8/4-Patient continues to make improvement, the right posterior calf area smaller with rim of keratotic skin on one side, the left wound is definitely smaller and improving. 8/11-Returns at 1 week, after being in 3 layer compression on both legs, both wounds appear to be improving, making good progress, patient is happy, pain is also less especially in the right leg wound 8/18; the area on the left anterior lower leg is healed. On the right posterior leg the wound remains although the dimensions are a lot better. 8/25; he arrives in clinic today with a large body of open wound on the left lateral calf. All of the 3 wounds in this area are in close juxtaposition to each other. The story is that we discharged him last  week with no a wrap on the left leg. They went to [facility] could not get in as they are only excepting phone orders or online orders for stockings hence they did not put any stocking on the left leg all week. They have something at home but the patient with that was either incapable or just did not put them on. Apparently these opened 1 morning after getting out of bed. The area on the right has no real change 9/1; patient has bilateral lower extremity wounds in the setting of severe chronic venous insufficiency and secondary lymphedema. He arrived last week with new areas on the left lateral lower leg after we did not wrap him and he did not use his stockings. Nevertheless the areas on the left look better today under compression. Posterior right calf does not really changed. We are using Hydrofera Blue on both areas under compression 9/15; bilateral lower extremity wounds in the setting of severe chronic venous insufficiency and secondary lymphedema. He has 20 to 30 mmHg below-knee compression stockings under the eventuality that these close over. We did get the left leg to close but he did not transition to a stocking and this reopened. There are 2 open areas on the left posterior lateral calf and one on the right. Both of these look satisfactory. Using Hydrofera Blue 9/22; bilateral lower extremity wounds in the   setting of chronic venous insufficiency. 2 superficial areas on the left lateral calf. One on the right just above the Achilles area. We have good edema control we have been using Hydrofera Blue 9/29; the areas on the left lateral calf are healed. On the right just above the Achilles and tendon area things look a lot better small wounds one scabbed area. We have been using Hydrofera Blue. We can discharge him in his own stocking on the left still wrapping on the right. This is the second time we have healed the left leg but he did not put a stocking on last time. Hopefully this will  maintain the edema from chronic venous disease with secondary lymphedema 10/6; he comes in today having a stocking on the left leg. They had trouble getting it on there is a lot of increase in swelling 2 small open areas one anteriorly and one on the medial calf. They report a lot of difficulty getting the stocking on. Paradoxically the area on the right that we have been wrapping posteriorly is closed 10/13; he comes in today with wounds bilaterally including superficial areas on the left medial and left lateral calf. As well as the right posterior has reopened in the Achilles area superiorly. He still does not have his juxta lite stockings although truthfully we would not of been able to use them today anyway. Apparently have been ordered and paid for from [employer] although they have not been delivered 10/20; his area on the right is just the boat closed on the right posterior. Still has the area on the left lateral and a very tiny area on the left medial. He has his bilateral juxta lites although he is not ready for them this week. He tolerated the increase to 4 layer compression last week quite well 10/27; the area on the right posterior calf is once again closed. He has a superficial area on the left lateral calf that is still open. He has been using Hydrofera Blue and bilateral 4-layer compression. He can change to his own juxta lite stocking on the right and we are instructing him today 11/3; the area on the right posterior calf reopened according to the patient and his wife after they took off the stocking when they got home last week. Apparently scabbed over there is now a fairly substantial wound which looks pretty much the same. Our intake nurse noted that they were using the juxta lite stockings appropriately. I was really hoping I might be able to close him out today. He has 1 very tiny remaining area on the left lateral lower leg. 11/10; right posterior calf wound measures smaller but  is still open. We have been using Hydrofera Blue. On the left he has a small oval-shaped wound and he seems to have had another wound distally that is open and likely a blister. We are using Hydrofera Blue under compression 11/17; right posterior calf wound continues to get better. We have been using Hydrofera Blue. On the left lateral one of the wounds has closed still a small open area. We have been using Hydrofera Blue on this as well. Both areas have been under 4-layer compression Arrives in clinic today with some swelling in the dorsal foot on the right some erythema of his forefoot and toes. Initially when I looked at this I almost thought this was a sunburn distal to a wrap injury. 12/1; right posterior calf wound debrided with a curette. We have been using Hydrofera Blue on the   left anterior lateral he has an area across the mid tibia. Finally a small area on the left lateral lower calf. Finally he continues to have de-epithelialized areas on the dorsal aspect of his toes. Initially thought this might be a burn injury when I saw him 2 weeks ago. I now wonder about tinea. I have also reviewed his arterial studies which were really quite good in July/20 with normal TBI's and ABIs but monophasic waveforms 12/8; comes in today with worsening problems especially on the left leg where he now has a cluster of wounds in the left anterior mid tibia. Very poor edema control. I reduced him to 3 layer from 4 layer compression last week because of some concern about blood flow to his toes however he does not have good edema control on the left leg. Right leg edema control looks satisfactory. On the left he has a cluster of wounds anteriorly, small area on the left medial fifth met head and then the collection of areas on his toes which appear better On the right he has the original area on the right posterior calf, a new area right medially. His formal arterial studies from mid July are noted below.  He was evaluated by Dr.Berry ABI Findings: +---------+------------------+-----+----------+--------+ Right Rt Pressure (mmHg)IndexWaveform Comment  +---------+------------------+-----+----------+--------+ Brachial 176     +---------+------------------+-----+----------+--------+ ATA 176 0.99 monophasic  +---------+------------------+-----+----------+--------+ PTA 183 1.03 monophasic  +---------+------------------+-----+----------+--------+ PERO 172 0.97 monophasic  +---------+------------------+-----+----------+--------+ Great Toe153 0.86    +---------+------------------+-----+----------+--------+ +---------+------------------+-----+-----------+-------+ Left Lt Pressure (mmHg)IndexWaveform Comment +---------+------------------+-----+-----------+-------+ Brachial 178     +---------+------------------+-----+-----------+-------+ ATA 162 0.91 multiphasic  +---------+------------------+-----+-----------+-------+ PTA 188 1.06 multiphasic  +---------+------------------+-----+-----------+-------+ PERO 158 0.89 monophasic   +---------+------------------+-----+-----------+-------+ Great Toe150 0.84    +---------+------------------+-----+-----------+-------+ +-------+-----------+-----------+------------+------------+ ABI/TBIToday's ABIToday's TBIPrevious ABIPrevious TBI +-------+-----------+-----------+------------+------------+ Right 1.03 0.86 1.11   +-------+-----------+-----------+------------+------------+ Left 1.06 0.84 1.27   +-------+-----------+-----------+------------+------------+ Tibial waveforms somewhat difficult to record due to irregular heartbeat. Bilateral ABIs appear essentially unchanged compared to prior study on 06/21/15. Summary: Right: Resting right ankle-brachial index is within normal range. No evidence of significant right lower extremity arterial disease. The right  toe-brachial index is normal. Although ankle brachial indices are within normal limits (0.95-1.29), arterial Doppler waveforms at the ankle suggest some component of arterial occlusive disease. Left: Resting left ankle-brachial index is within normal range. No evidence of significant left lower extremity arterial disease. The left toe-brachial index is normal. Although ankle brachial indices are within normal limits (0.95-1.29), arterial Doppler waveforms at the ankle suggest some component of arterial occlusive disease. 12/15; the patient's area on the left mid tibia looks better. Right posterior calf also better. He has the area on the left foot as well. All of his toes look better I think this was tinea. We are using Hydrofera Blue everywhere else The patient was in urgent care yesterday with wheezing and shortness of breath. He got azithromycin and prednisone. He feels better. His lungs are currently clear to auscultation. He was not tested for Covid 19 12/29; the patient arrives in clinic today with quite a bit change. 2 weeks ago he only had areas on the left mid tibia right posterior calf with tinea pedis resolving between his toes. He arrives in clinic today with several areas on the dorsal toes on the right, dorsal left second toe. He has skin breakdown in the left medial calf probably from excess edema. Small area proximally in the medial calf. He has weeping edema fluid coming out of the skin excoriations on the left medial calf. He tells  me that he is having a cardiac catheterization next week. I had a quick look at [facility]. He was found to have an ejection fraction of 25% during the work-up for persistent atrial fibrillation. He saw his cardiology office yesterday seen by the nurse practitioner. She increased his carvedilol. He has not been on diuretics for apparently several months and indeed in the nurse practitioner Laura Ingolds notes she had knowledge of this. 10/04/2019  on evaluation today patient presents as a walk-in visit concerning the fact that he did not have an appointment here for our clinic at this point. He actually had a cardiac catheterization earlier today and then came from there to here in order to be evaluated. With that being said unfortunately he is having significant cellulitis of his left lower extremity upon evaluation today this appears to have deteriorated even since last week's evaluation with Dr. Lometa Riggin. The right lower extremity is actually doing okay I really see no evidence of deterioration at this point at those locations. In fact the right leg seems in general be doing quite well. Nonetheless I am concerned about infection and cellulitis of the left lower extremity and again considering his weakened heart I do not want him to develop into sepsis at all. He is also having some trouble breathing today and I understand according to nursing staff this is always the case to some degree. With that being said the patient unfortunately seems to be in my opinion a little bit worse even his wife feels like that may be the case today. Unsure exactly what is leading to this. Cardiac catheterization I did review the report which showed an ejection fraction of 25% he also had an LAD blockage of around 25% based on what I saw. With that being said there was no significant blockages that required stenting at this point he does have weakened cardiac muscles compared to normal. 10/05/2019; patient was seen yesterday in clinic. He was sent to the ER because of cellulitis of the left leg possibility. In the ER he was given 1 dose of IV Levaquin and discharged on Keflex. He came in the clinic initially for a nurse visit to rewrap his left leg. We did not look at the right leg today that is an Unna boot. The patient also had a cardiac cath. According to him there were no blockages but a very low ejection fraction. 1/12; back for an early follow-up. The  condition of the left lower leg is a lot better although there are multiple open areas. All of them with not a particularly viable surface. On the right posterior calf he has a single wound with a clean surface. He has a wound with exposed bone on the PIP of the left second toe dorsally. He has wounds on the dorsal right first second and third toes. His arterial studies were normal. 1/19; the patient has 3 open wounds on the left leg anteriorly in the mid tibia, distally and medially just above the ankle and posteriorly. On the right he has a small area dime sized on the right posterior calf. His edema control is a lot better. He still has wounds on the right second and left second toes. The left second toe has exposed bone. X- ray I did last week did not show evidence of osteomyelitis in the left foot. 1/26; patient with a multiplicity of wounds and problems. On the left he has a circular area on the left anterior mid tibia Left just above   the medical ankle -left 2nd toe pip -right 2nd toe right posterior calf 2/2; patient with a multiplicity of wounds and problems. He has severe chronic venous insufficiency and the wounds on his leg are all on the left left anterior left medial at the medial malleolus and left posterior. We have been using Iodoflex to these areas to help with ongoing debridement. He also has largely traumatic wounds on his toes this includes the left second with exposed bone. Bone culture I did last week showed Staph aureus which is methicillin sensitive. I discussed with him today the idea of an amputation of this toe because it is literally nonfunctional however he wants to try antibiotics. Antibiotic choice is complicated by the fact that he is on Coumadin. He also has wounds over the dorsal part of the right first which is close to closed. Right second toe has exposed bone and the right third toe at the base of the right third toe is just about closed as well. We have  been using silver alginate 2/9; patient with severe chronic venous insufficiency. He has large wounds on the left anterior mid tibia and on the left medial just above the ankle. I am concerned today about the depth of the area on the left mid tibia may be exposing into the muscle. He has small areas on the left posterior calf and on the right posterior calf although these do not look as threatening. He also has traumatic wounds on his toes. The right first and third are closed however the bilateral second toes have exposed bone. The area on the left is open into the distal interphalangeal joint. In my opinion these toes are nonsalvageable and will need amputation 2/16; patient with severe chronic venous insufficiency. He has wounds on the left anterior mid tibia left medial lower leg just above the ankle. He has small areas on the left posterior calf and a more prominent area on the right posterior calf. All of these are related to poorly controlled venous hypertension. He also had traumatic wounds on the PIPs of both second toes. Unfortunately both of these have exposed bone and in the case of the left this I think goes right into the joint itself. I do not think either 1 of these is salvageable. I have reviewed his arterial evaluation that was done in July last year. He also saw Dr. Berry in June. He felt these were venous stasis. Previously had segmental arterial pressures performed in 11/03/2014 which were normal. He was sent for for a follow-up arterial evaluation on July 1. On the right his ABIs were 1.03 with a TBI of 0.86 although his waveforms were monophasic on the left he had multiphasic waveforms at the ATA PTA but monophasic at the peroneal nevertheless his ABI was normal at 1.06 TBI also normal at 0.84. I think he has enough blood flow to heal an amputation which I think he needs of the left second toe and probably the right second toe as well 2/23; the patient is going for his  bilateral second toe amputation by Dr. Evans on Friday. In the meantime is 3 areas on the left leg and the small area on the right posterior look better. We have been using silver collagen. 3/2; the patient's toe amputations were canceled because of cardiac concerns. Apparently this surgery will need to be done in the hospital although they do not have an appointment. In the meantime he is continues to have 3 areas on the left leg   one anteriorly and 2 smaller areas posteriorly on the left as well as the small posterior area on the right. We have been using silver collagen with compression 3/9; still no word on the second toe amputations bilaterally. 3 wounds on the left leg all look better and the area on the right posterior we have been using silver collagen I change this to [facility] 3/16; amputations next Monday both second toes he is apparently going to be hospitalized. He has on his left leg including left anterior and left lateral look better very superficial also the right posterior. 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 He continues to have left leg wounds anteriorly x2 posteriorly x1 at the left lower leg just above the ankle anteriorly x1 and posteriorly on the right x1 3/30; his anterior left leg wounds are considerably smaller. In fact the only wound that is not smaller is on the right. We have been using Hydrofera Blue under Kerlix Cobax 4/6; his anterior left leg wounds continue to contract in fact the area distally and medially has closed over. He still has the area anteriorly on the left. Both posterior calf wounds on the left and right are much smaller. We saw his surgical wound at the left second toe amputation site that still sutured it looks healthy, this is being followed by his podiatrist. We went over what compression he has he has bilateral juxta lites I am  hopeful the issue here was that we just did not have them tight enough because he broke down very quickly the last time we healed him out. Electronic Signature(s) Signed: 01/02/2020 6:13:52 PM By: [name] MD Entered By: [name] on 01/02/2020 09:14:51 -------------------------------------------------------------------------------- Physical Exam Details Patient Name: Date of Service: [name], [name] 01/02/2020 8:00 AM Medical Record [id number] Patient Account Number: [id number] Date of Birth/Sex: Treating RN: [birth year] (79 y.o. [name]) [name] Primary Care Provider: Jeralyn Ruths Other Clinician: Referring Provider: Treating Provider/Extender:Marcellino Fidalgo, [name], [facility] Weeks in Treatment: 37 Constitutional Sitting or standing Blood Pressure is within target range for patient.. Pulse regular and within target range for patient.Marland Kitchen Respirations regular, non-labored and within target range.. Temperature is normal and within the target range for the patient.Marland Kitchen Appears in no distress. Cardiovascular Pedal pulses are palpable. Changes of significant chronic venous insufficiency but we have excellent edema control wrapping his legs bilaterally. Notes Wound exam; all of his wounds are improved. The distal 1 on the anterior left is closed. Proximal 1 smaller areas posteriorly are both smaller. We have excellent edema control bilaterally Electronic Signature(s) Signed: 01/02/2020 6:13:52 PM By: [name] MD Entered By: [name] on 01/02/2020 09:15:51 -------------------------------------------------------------------------------- Physician Orders Details Patient Name: Date of Service: Daniel Pia D. 01/02/2020 8:00 AM Medical Record [id number] Patient Account Number: [id number] Date of Birth/Sex: Treating RN: 01-14-1941 (79 y.o. [name]) [name] Primary Care Provider: Jeralyn Ruths Other Clinician: Referring Provider: Treating Provider/Extender:Monseratt Ledin,  [name], [facility] Weeks in Treatment: 80 Verbal / Phone Orders: No Diagnosis Coding ICD-10 Coding Code Description L97.211 Non-pressure chronic ulcer of right calf limited to breakdown of skin L97.221 Non-pressure chronic ulcer of left calf limited to breakdown of skin I87.333 Chronic venous hypertension (idiopathic) with ulcer and inflammation of bilateral lower extremity E11.51 Type 2 diabetes mellitus with diabetic peripheral angiopathy without gangrene E11.42 Type 2 diabetes mellitus with diabetic polyneuropathy L97.524 Non-pressure chronic ulcer of other part of  left foot with necrosis of bone L97.511 Non-pressure chronic ulcer of other part of right foot limited to breakdown of skin Y81.44 Chronic systolic (congestive) heart failure Y18.563 Other chronic osteomyelitis, left ankle and foot Follow-up Appointments Return Appointment in 1 week. - Tuesday **********EXTRA TIME**************** Dressing Change Frequency Wound #19 Left,Anterior Lower Leg Do not change entire dressing for one week. Wound #24 Left,Distal,Anterior Lower Leg Do not change entire dressing for one week. Wound #25 Left,Distal,Posterior Lower Leg Do not change entire dressing for one week. Wound #3R Right,Posterior Calf Do not change entire dressing for one week. Skin Barriers/Peri-Wound Care TCA Cream or Ointment Wound Cleansing May shower with protection. Primary Wound Dressing Wound #19 Left,Anterior Lower Leg Hydrofera Blue Wound #24 Left,Distal,Anterior Lower Leg Hydrofera Blue Wound #25 Left,Distal,Posterior Lower Leg Hydrofera Blue Wound #3R Right,Posterior Calf Hydrofera Blue Secondary Dressing Dry Gauze - secure toes with tape Edema Control Wound #19 Left,Anterior Lower Leg Kerlix and Coban - Bilateral Wound #24 Left,Distal,Anterior Lower Leg Kerlix and Coban - Bilateral Wound #25 Left,Distal,Posterior Lower Leg Kerlix and Coban - Bilateral Wound #3R Right,Posterior Calf Kerlix and  Coban - Bilateral Off-Loading Open toe surgical shoe to: - left and right foot Electronic Signature(s) Signed: 01/02/2020 6:13:52 PM By: [name] MD Signed: 01/02/2020 6:43:44 PM By: Daniel Coria RN Entered By: [name] on 01/02/2020 08:02:58 -------------------------------------------------------------------------------- Problem List Details Patient Name: Date of Service: Daniel Pia D. 01/02/2020 8:00 AM Medical Record [id number] Patient Account Number: [id number] Date of Birth/Sex: Treating RN: July 25, 1941 (79 y.o. [name]) Dolores Lory, [facility] Primary Care Provider: Jeralyn Ruths Other Clinician: Referring Provider: Treating Provider/Extender:Tationa Stech, [name], [facility] Weeks in Treatment: 13 Active Problems ICD-10 Evaluated Encounter Code Description Active Date Today Diagnosis L97.211 Non-pressure chronic ulcer of right calf limited to 02/28/2019 No Yes breakdown of skin L97.221 Non-pressure chronic ulcer of left calf limited to 02/28/2019 No Yes breakdown of skin I87.333 Chronic venous hypertension (idiopathic) with ulcer 02/28/2019 No Yes and inflammation of bilateral lower extremity E11.51 Type 2 diabetes mellitus with diabetic peripheral 02/28/2019 No Yes angiopathy without gangrene E11.42 Type 2 diabetes mellitus with diabetic polyneuropathy 02/28/2019 No Yes L97.524 Non-pressure chronic ulcer of other part of left foot 10/10/2019 No Yes with necrosis of bone L97.511 Non-pressure chronic ulcer of other part of right foot 09/26/2019 No Yes limited to breakdown of skin Y77.41 Chronic systolic (congestive) heart failure 10/05/2019 No Yes M86.672 Other chronic osteomyelitis, left ankle and foot 10/31/2019 No Yes Inactive Problems ICD-10 Code Description Active Date Inactive Date B35.3 Tinea pedis 09/05/2019 09/05/2019 L97.521 Non-pressure chronic ulcer of other part of left foot limited to 09/26/2019 09/26/2019 breakdown of skin Resolved Problems Electronic Signature(s) Signed:  01/02/2020 6:13:52 PM By: [name] MD Entered By: [name] on 01/02/2020 09:13:20 -------------------------------------------------------------------------------- Progress Note Details Patient Name: Date of Service: Daniel Pia D. 01/02/2020 8:00 AM Medical Record [id number] Patient Account Number: [id number] Date of Birth/Sex: Treating RN: 1941/07/26 (79 y.o. [name] Primary Care Provider: Jeralyn Ruths Other Clinician: Referring Provider: Treating Provider/Extender:Corney Knighton, [name], [name] Weeks in Treatment: 44 Subjective History of Present Illness (HPI) The following HPI elements were documented for the patient's wound: Location: Patient presents with a wound to left lower leg. Quality: Patient reports No Pain. Duration: 2 months no cig or alcohol. spontaneous appearance in area of stasis dermamtitis. [name] on metformin only. chronic afib on Coumadin. diabetes and coag studies not good. hba1c 7.5. ivr 4.5. no pain or sxs of systemic disease. hx chf. no intermittent claudication 02/28/2019 Readmission This is a  now a 79 year old man who was previously cared for in 2016 by Dr. Lindon Romp for wounds on his lower extremities. At that point he had venous reflux studies although I cannot seem to open these in [place] link. He had arterial studies showing an ABI of 1.11 on the right and 1.27 on the left his waveforms were triphasic bilaterally. He was discharged in stockings although I do not believe he is wearing these in some time. He tells me that about a month ago he noted openings of a large wound on the posterior right calf and 2 smaller areas on the left lateral calf and a small area more recently on the left posterior calf. He has been dressing these with peroxide and triple antibiotic ointment. He is not wearing compression. Past medical history; type 2 diabetes with peripheral neuropathy, chronic venous insufficiency, hypertension, cardiomyopathy,  chronic atrial fibrillation on Coumadin, prostate cancer, hyperlipidemia, gout, ABI in our clinic was 1.34 on the left and not obtainable on the right 6/9; this is a patient who has chronic venous insufficiency. He has a fairly substantial area on the right posterior calf, left lateral calf and a small area on the left posterior calf. On arrival last week he had very palpable popliteal and femoral pulses but nothing in his bilateral feet. Unfortunately we cannot get arterial studies until July 1 at Dr. Kennon Holter office. They live in [place]. We use silver alginate under Kerlix Coban 6/16; patient with chronic venous insufficiency with wounds on his bilateral lower extremities. When he came into our clinic he was discovered to have a complete absence of peripheral pedal pulses at either the dorsalis pedis or posterior tibial. He does have easily palpable femoral and popliteal pulses. He sees Dr. [name] tomorrow for noninvasive arterial tests. He may also require venous reflux evaluation although I do not view this as an urgent thing. We have been using silver alginate. His wound surfaces of cleaned up quite nicely 6/23; patient with chronic venous insufficiency with wounds on his bilateral lower extremities. His wounds all are somewhat better looking. He did go to Dr. Kennon Holter office but somehow ended up on the doctors schedule rather than being scheduled for noninvasive tests therefore his noninvasive tests are scheduled for July 1. We agree that he has venous insufficiency ulcers but I cannot feel any pulses in his lower extremities dictating the need for test. We are only using Kerlix and light Coban unfortunately this appears to be holding the edema 6/30; has his arterial studies tomorrow. We have been using Kerlix and light Coban will go to a more aggressive compression if the arterial studies will allow. We all agreed these are venous wounds however I cannot feel pulses at either the dorsalis  pedis or posterior tibial bilaterally. His wounds generally look some better including left lateral and right posterior. 7/7-Patient returns at 1 week in Kerlix/Coban to both legs, with improvement, in the left lateral and right posterior lower leg wounds, ABI's are normal in both legs per vascular studies, TBI is also normal on both sides, we are using hydrofera blue to the wounds 7/14; patient's arterial studies from 2 weeks ago showed an ABI on the right at 1.03 with a TBI of 0.86. On the left the ABI was 1.06 with a TBI of 0.84. Notable for the fact that his arterial waveforms were monophasic in all of the lower extremity arteries suggesting some degree of arterial occlusive disease but in general this was felt to be fairly adequate  for healing. His compression was increased from 2-3 layer which is appropriate. Dressing was changed to [facility] [place] 7/21; patient's wounds are measuring smaller. The more substantial one on the right posterior calf, second 1 on the left lateral calf. Using [facility] on both wound areas 7/28; patient continues to make nice improvements. The area on the right posterior calf is smaller. Area on the left lateral calf also is smaller. We have been using Hydrofera Blue under compression. The patient will need compression stockings and we have measured him for these in the eventuality that these heal which really should not be too long from now 8/4-Patient continues to make improvement, the right posterior calf area smaller with rim of keratotic skin on one side, the left wound is definitely smaller and improving. 8/11-Returns at 1 week, after being in 3 layer compression on both legs, both wounds appear to be improving, making good progress, patient is happy, pain is also less especially in the right leg wound 8/18; the area on the left anterior lower leg is healed. On the right posterior leg the wound remains although the dimensions are a lot better. 8/25; he  arrives in clinic today with a large body of open wound on the left lateral calf. All of the 3 wounds in this area are in close juxtaposition to each other. The story is that we discharged him last week with no a wrap on the left leg. They went to [facility] could not get in as they are only excepting phone orders or online orders for stockings hence they did not put any stocking on the left leg all week. They have something at home but the patient with that was either incapable or just did not put them on. Apparently these opened 1 morning after getting out of bed. The area on the right has no real change 9/1; patient has bilateral lower extremity wounds in the setting of severe chronic venous insufficiency and secondary lymphedema. He arrived last week with new areas on the left lateral lower leg after we did not wrap him and he did not use his stockings. Nevertheless the areas on the left look better today under compression. Posterior right calf does not really changed. We are using Hydrofera Blue on both areas under compression 9/15; bilateral lower extremity wounds in the setting of severe chronic venous insufficiency and secondary lymphedema. He has 20 to 30 mmHg below-knee compression stockings under the eventuality that these close over. We did get the left leg to close but he did not transition to a stocking and this reopened. There are 2 open areas on the left posterior lateral calf and one on the right. Both of these look satisfactory. Using [facility] 9/22; bilateral lower extremity wounds in the setting of chronic venous insufficiency. 2 superficial areas on the left lateral calf. One on the right just above the Achilles area. We have good edema control we have been using Hydrofera Blue 9/29; the areas on the left lateral calf are healed. On the right just above the Achilles and tendon area things look a lot better small wounds one scabbed area. We have been using Hydrofera Blue. We  can discharge him in his own stocking on the left still wrapping on the right. This is the second time we have healed the left leg but he did not put a stocking on last time. Hopefully this will maintain the edema from chronic venous disease with secondary lymphedema 10/6; he comes in today having a  stocking on the left leg. They had trouble getting it on there is a lot of increase in swelling 2 small open areas one anteriorly and one on the medial calf. They report a lot of difficulty getting the stocking on. ooParadoxically the area on the right that we have been wrapping posteriorly is closed 10/13; he comes in today with wounds bilaterally including superficial areas on the left medial and left lateral calf. As well as the right posterior has reopened in the Achilles area superiorly. He still does not have his juxta lite stockings although truthfully we would not of been able to use them today anyway. Apparently have been ordered and paid for from [employer] although they have not been delivered 10/20; his area on the right is just the boat closed on the right posterior. Still has the area on the left lateral and a very tiny area on the left medial. He has his bilateral juxta lites although he is not ready for them this week. He tolerated the increase to 4 layer compression last week quite well 10/27; the area on the right posterior calf is once again closed. He has a superficial area on the left lateral calf that is still open. He has been using Hydrofera Blue and bilateral 4-layer compression. He can change to his own juxta lite stocking on the right and we are instructing him today 11/3; the area on the right posterior calf reopened according to the patient and his wife after they took off the stocking when they got home last week. Apparently scabbed over there is now a fairly substantial wound which looks pretty much the same. Our intake nurse noted that they were using the juxta lite  stockings appropriately. I was really hoping I might be able to close him out today. He has 1 very tiny remaining area on the left lateral lower leg. 11/10; right posterior calf wound measures smaller but is still open. We have been using Hydrofera Blue. On the left he has a small oval-shaped wound and he seems to have had another wound distally that is open and likely a blister. We are using Hydrofera Blue under compression 11/17; right posterior calf wound continues to get better. We have been using Hydrofera Blue. On the left lateral one of the wounds has closed still a small open area. We have been using Hydrofera Blue on this as well. Both areas have been under 4-layer compression Arrives in clinic today with some swelling in the dorsal foot on the right some erythema of his forefoot and toes. Initially when I looked at this I almost thought this was a sunburn distal to a wrap injury. 12/1; right posterior calf wound debrided with a curette. We have been using Hydrofera Blue on the left anterior lateral he has an area across the mid tibia. Finally a small area on the left lateral lower calf. Finally he continues to have de-epithelialized areas on the dorsal aspect of his toes. Initially thought this might be a burn injury when I saw him 2 weeks ago. I now wonder about tinea. I have also reviewed his arterial studies which were really quite good in July/20 with normal TBI's and ABIs but monophasic waveforms 12/8; comes in today with worsening problems especially on the left leg where he now has a cluster of wounds in the left anterior mid tibia. Very poor edema control. I reduced him to 3 layer from 4 layer compression last week because of some concern about blood flow to  his toes however he does not have good edema control on the left leg. Right leg edema control looks satisfactory. ooOn the left he has a cluster of wounds anteriorly, small area on the left medial fifth met head and then  the collection of areas on his toes which appear better ooOn the right he has the original area on the right posterior calf, a new area right medially. His formal arterial studies from mid July are noted below. He was evaluated by Dr.Berry ABI Findings: +---------+------------------+-----+----------+--------+ Right Rt Pressure (mmHg)IndexWaveform Comment  +---------+------------------+-----+----------+--------+ Brachial 176     +---------+------------------+-----+----------+--------+ ATA 176 0.99 monophasic  +---------+------------------+-----+----------+--------+ PTA 183 1.03 monophasic  +---------+------------------+-----+----------+--------+ PERO 172 0.97 monophasic  +---------+------------------+-----+----------+--------+ Great Toe153 0.86    +---------+------------------+-----+----------+--------+ +---------+------------------+-----+-----------+-------+ Left Lt Pressure (mmHg)IndexWaveform Comment +---------+------------------+-----+-----------+-------+ Brachial 178     +---------+------------------+-----+-----------+-------+ ATA 162 0.91 multiphasic  +---------+------------------+-----+-----------+-------+ PTA 188 1.06 multiphasic  +---------+------------------+-----+-----------+-------+ PERO 158 0.89 monophasic   +---------+------------------+-----+-----------+-------+ Great Toe150 0.84    +---------+------------------+-----+-----------+-------+ +-------+-----------+-----------+------------+------------+ ABI/TBIToday's ABIToday's TBIPrevious ABIPrevious TBI +-------+-----------+-----------+------------+------------+ Right 1.03 0.86 1.11   +-------+-----------+-----------+------------+------------+ Left 1.06 0.84 1.27   +-------+-----------+-----------+------------+------------+ Tibial waveforms somewhat difficult to record due to irregular heartbeat. Bilateral ABIs appear  essentially unchanged compared to prior study on 06/21/15. Summary: Right: Resting right ankle-brachial index is within normal range. No evidence of significant right lower extremity arterial disease. The right toe-brachial index is normal. Although ankle brachial indices are within normal limits (0.95-1.29), arterial Doppler waveforms at the ankle suggest some component of arterial occlusive disease. Left: Resting left ankle-brachial index is within normal range. No evidence of significant left lower extremity arterial disease. The left toe-brachial index is normal. Although ankle brachial indices are within normal limits (0.95-1.29), arterial Doppler waveforms at the ankle suggest some component of arterial occlusive disease. 12/15; the patient's area on the left mid tibia looks better. Right posterior calf also better. He has the area on the left foot as well. All of his toes look better I think this was tinea. We are using Hydrofera Blue everywhere else The patient was in urgent care yesterday with wheezing and shortness of breath. He got azithromycin and prednisone. He feels better. His lungs are currently clear to auscultation. He was not tested for Covid 19 12/29; the patient arrives in clinic today with quite a bit change. 2 weeks ago he only had areas on the left mid tibia right posterior calf with tinea pedis resolving between his toes. He arrives in clinic today with several areas on the dorsal toes on the right, dorsal left second toe. He has skin breakdown in the left medial calf probably from excess edema. Small area proximally in the medial calf. He has weeping edema fluid coming out of the skin excoriations on the left medial calf. He tells me that he is having a cardiac catheterization next week. I had a quick look at [facility] health link. He was found to have an ejection fraction of 25% during the work-up for persistent atrial fibrillation. He saw his cardiology office yesterday  seen by the nurse practitioner. She increased his carvedilol. He has not been on diuretics for apparently several months and indeed in the nurse practitioner Dietrich Pates notes she had knowledge of this. 10/04/2019 on evaluation today patient presents as a walk-in visit concerning the fact that he did not have an appointment here for our clinic at this point. He actually had a cardiac catheterization earlier today and then came from there to here in order to be evaluated. With that being said  unfortunately he is having significant cellulitis of his left lower extremity upon evaluation today this appears to have deteriorated even since last week's evaluation with Dr. Dellia Nims. The right lower extremity is actually doing okay I really see no evidence of deterioration at this point at those locations. In fact the right leg seems in general be doing quite well. Nonetheless I am concerned about infection and cellulitis of the left lower extremity and again considering his weakened heart I do not want him to develop into sepsis at all. He is also having some trouble breathing today and I understand according to nursing staff this is always the case to some degree. With that being said the patient unfortunately seems to be in my opinion a little bit worse even his wife feels like that may be the case today. Unsure exactly what is leading to this. Cardiac catheterization I did review the report which showed an ejection fraction of 25% he also had an LAD blockage of around 25% based on what I saw. With that being said there was no significant blockages that required stenting at this point he does have weakened cardiac muscles compared to normal. 10/05/2019; patient was seen yesterday in clinic. He was sent to the ER because of cellulitis of the left leg possibility. In the ER he was given 1 dose of IV Levaquin and discharged on Keflex. He came in the clinic initially for a nurse visit to rewrap his left leg. We  did not look at the right leg today that is an Haematologist. The patient also had a cardiac cath. According to him there were no blockages but a very low ejection fraction. 1/12; back for an early follow-up. The condition of the left lower leg is a lot better although there are multiple open areas. All of them with not a particularly viable surface. On the right posterior calf he has a single wound with a clean surface. He has a wound with exposed bone on the PIP of the left second toe dorsally. He has wounds on the dorsal right first second and third toes. His arterial studies were normal. 1/19; the patient has 3 open wounds on the left leg anteriorly in the mid tibia, distally and medially just above the ankle and posteriorly. On the right he has a small area dime sized on the right posterior calf. His edema control is a lot better. He still has wounds on the right second and left second toes. The left second toe has exposed bone. X- ray I did last week did not show evidence of osteomyelitis in the left foot. 1/26; patient with a multiplicity of wounds and problems. ooOn the left he has a circular area on the left anterior mid tibia ooLeft just above the medical ankle -left 2nd toe pip -right 2nd toe right posterior calf 2/2; patient with a multiplicity of wounds and problems. He has severe chronic venous insufficiency and the wounds on his leg are all on the left left anterior left medial at the medial malleolus and left posterior. We have been using Iodoflex to these areas to help with ongoing debridement. He also has largely traumatic wounds on his toes this includes the left second with exposed bone. Bone culture I did last week showed Staph aureus which is methicillin sensitive. I discussed with him today the idea of an amputation of this toe because it is literally nonfunctional however he wants to try antibiotics. Antibiotic choice is complicated by the fact that he is  on Coumadin. He  also has wounds over the dorsal part of the right first which is close to closed. Right second toe has exposed bone and the right third toe at the base of the right third toe is just about closed as well. We have been using silver alginate 2/9; patient with severe chronic venous insufficiency. He has large wounds on the left anterior mid tibia and on the left medial just above the ankle. I am concerned today about the depth of the area on the left mid tibia may be exposing into the muscle. He has small areas on the left posterior calf and on the right posterior calf although these do not look as threatening. He also has traumatic wounds on his toes. The right first and third are closed however the bilateral second toes have exposed bone. The area on the left is open into the distal interphalangeal joint. In my opinion these toes are nonsalvageable and will need amputation 2/16; patient with severe chronic venous insufficiency. He has wounds on the left anterior mid tibia left medial lower leg just above the ankle. He has small areas on the left posterior calf and a more prominent area on the right posterior calf. All of these are related to poorly controlled venous hypertension. He also had traumatic wounds on the PIPs of both second toes. Unfortunately both of these have exposed bone and in the case of the left this I think goes right into the joint itself. I do not think either 1 of these is salvageable. I have reviewed his arterial evaluation that was done in July last year. He also saw Dr. [name] in June. He felt these were venous stasis. Previously had segmental arterial pressures performed in 11/03/2014 which were normal. He was sent for for a follow-up arterial evaluation on July 1. On the right his ABIs were 1.03 with a TBI of 0.86 although his waveforms were monophasic on the left he had multiphasic waveforms at the ATA PTA but monophasic at the peroneal nevertheless his ABI was normal at  1.06 TBI also normal at 0.84. I think he has enough blood flow to heal an amputation which I think he needs of the left second toe and probably the right second toe as well 2/23; the patient is going for his bilateral second toe amputation by Dr. Amalia Hailey on Friday. In the meantime is 3 areas on the left leg and the small area on the right posterior look better. We have been using silver collagen. 3/2; the patient's toe amputations were canceled because of cardiac concerns. Apparently this surgery will need to be done in the hospital although they do not have an appointment. In the meantime he is continues to have 3 areas on the left leg one anteriorly and 2 smaller areas posteriorly on the left as well as the small posterior area on the right. We have been using silver collagen with compression 3/9; still no word on the second toe amputations bilaterally. 3 wounds on the left leg all look better and the area on the right posterior we have been using silver collagen I change this to [facility] 3/16; amputations next Monday both second toes he is apparently going to be hospitalized. He has on his left leg including left anterior and left lateral look better very superficial also the right posterior. 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  He continues to have left leg wounds anteriorly x2 posteriorly x1 at the left lower leg just above the ankle anteriorly x1 and posteriorly on the right x1 3/30; his anterior left leg wounds are considerably smaller. In fact the only wound that is not smaller is on the right. We have been using Hydrofera Blue under Kerlix Cobax 4/6; his anterior left leg wounds continue to contract in fact the area distally and medially has closed over. He still has the area anteriorly on the left. Both posterior calf wounds on the left and right are much smaller. We saw  his surgical wound at the left second toe amputation site that still sutured it looks healthy, this is being followed by his podiatrist. We went over what compression he has he has bilateral juxta lites I am hopeful the issue here was that we just did not have them tight enough because he broke down very quickly the last time we healed him out. Patient History Information obtained from Patient. Family History Cancer - Siblings, Diabetes - Siblings, Heart Disease - Siblings, Hypertension - Siblings, Stroke - Siblings, No family history of Hereditary Spherocytosis, Kidney Disease, Lung Disease, Seizures, Thyroid Problems, Tuberculosis. Social History Never smoker, Marital Status - Married, Alcohol Use - Never, Drug Use - No History, Caffeine Use - Rarely. Medical History Eyes Patient has history of Cataracts - Bilateral Denies history of Glaucoma, Optic Neuritis Ear/Nose/Mouth/Throat Denies history of Chronic sinus problems/congestion, Middle ear problems Hematologic/Lymphatic Denies history of Anemia, Human Immunodeficiency Virus, Lymphedema, Sickle Cell Disease Respiratory Denies history of Aspiration, Asthma, Chronic Obstructive Pulmonary Disease (COPD), Pneumothorax, Sleep Apnea, Tuberculosis Cardiovascular Patient has history of Hypertension, Peripheral Venous Disease Denies history of Angina, Congestive Heart Failure, Deep Vein Thrombosis, Hypotension, Myocardial Infarction, Peripheral Arterial Disease, Phlebitis, Vasculitis Gastrointestinal Denies history of Cirrhosis , Colitis, Crohnoos, Hepatitis A, Hepatitis B, Hepatitis C Endocrine Patient has history of Type II Diabetes Genitourinary Denies history of End Stage Renal Disease Immunological Denies history of Lupus Erythematosus, Raynaudoos, Scleroderma Integumentary (Skin) Denies history of History of Burn Musculoskeletal Patient has history of Gout - History of, Alpuranol Neurologic Denies history of Dementia,  Neuropathy, Quadriplegia, Paraplegia, Seizure Disorder Oncologic Patient has history of Received Radiation - prostrate cancer Psychiatric Denies history of Anorexia/bulimia, Confinement Anxiety Hospitalization/Surgery History - Cardiac shock treatment. - knee surgery. - right 2nd toe amputation. - left 2nd toe amputation. Objective Constitutional Sitting or standing Blood Pressure is within target range for patient.. Pulse regular and within target range for patient.Marland Kitchen Respirations regular, non-labored and within target range.. Temperature is normal and within the target range for the patient.Marland Kitchen Appears in no distress. Vitals Time Taken: 8:24 AM, Height: 74 in, Source: Stated, Weight: 212 lbs, Source: Stated, BMI: 27.2, Temperature: 97.5 F, Pulse: 67 bpm, Respiratory Rate: 18 breaths/min, Blood Pressure: 116/59 mmHg. Cardiovascular Pedal pulses are palpable. Changes of significant chronic venous insufficiency but we have excellent edema control wrapping his legs bilaterally. General Notes: Wound exam; all of his wounds are improved. The distal 1 on the anterior left is closed. Proximal 1 smaller areas posteriorly are both smaller. We have excellent edema control bilaterally Integumentary (Hair, Skin) Wound #19 status is Open. Original cause of wound was Gradually Appeared. The wound is located on the Left,Anterior Lower Leg. The wound measures 0.9cm length x 0.6cm width x 0.1cm depth; 0.424cm^2 area and 0.042cm^3 volume. There is Fat Layer (Subcutaneous Tissue) Exposed exposed. There is no tunneling or undermining noted. There is a small amount of serosanguineous drainage noted.  The wound margin is flat and intact. There is large (67-100%) red granulation within the wound bed. There is no necrotic tissue within the wound bed. Wound #24 status is Healed - Epithelialized. Original cause of wound was Gradually Appeared. The wound is located on the [facility] Lower Leg. The wound  measures 0cm length x 0cm width x 0cm depth; 0cm^2 area and 0cm^3 volume. There is no tunneling or undermining noted. There is a none present amount of drainage noted. The wound margin is flat and intact. There is no granulation within the wound bed. There is no necrotic tissue within the wound bed. Wound #25 status is Open. Original cause of wound was Gradually Appeared. The wound is located on the Left,Distal,Posterior Lower Leg. The wound measures 1cm length x 0.5cm width x 0.1cm depth; 0.393cm^2 area and 0.039cm^3 volume. There is Fat Layer (Subcutaneous Tissue) Exposed exposed. There is no tunneling or undermining noted. There is a small amount of serosanguineous drainage noted. The wound margin is flat and intact. There is large (67-100%) pink granulation within the wound bed. There is a small (1-33%) amount of necrotic tissue within the wound bed including Adherent Slough. Wound #3R status is Open. Original cause of wound was Gradually Appeared. The wound is located on the Right,Posterior Calf. The wound measures 0.4cm length x 0.3cm width x 0.1cm depth; 0.094cm^2 area and 0.009cm^3 volume. There is Fat Layer (Subcutaneous Tissue) Exposed exposed. There is no tunneling or undermining noted. There is a small amount of serosanguineous drainage noted. The wound margin is flat and intact. There is large (67-100%) red granulation within the wound bed. There is no necrotic tissue within the wound bed. 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 - Tuesday **********EXTRA TIME**************** Dressing Change Frequency: Wound #19 Left,Anterior Lower Leg: Do not change entire dressing for one week. Wound #24 Left,Distal,Anterior Lower Leg: Do not change entire dressing for one week. Wound #25 Left,Distal,Posterior Lower Leg: Do not change entire dressing for one week. Wound #3R Right,Posterior Calf: Do not change entire dressing for one week. Skin Barriers/Peri-Wound Care: TCA Cream or Ointment Wound Cleansing: May shower with protection. Primary Wound Dressing: Wound #19 Left,Anterior Lower Leg: Hydrofera Blue Wound #24 Left,Distal,Anterior Lower Leg: Hydrofera Blue Wound #25 Left,Distal,Posterior Lower Leg: Hydrofera Blue Wound #3R Right,Posterior Calf: Hydrofera Blue Secondary Dressing: Dry Gauze - secure toes with tape Edema Control: Wound #19 Left,Anterior Lower Leg: Kerlix and Coban - Bilateral Wound #24 Left,Distal,Anterior Lower Leg: Kerlix and Coban - Bilateral Wound #25 Left,Distal,Posterior Lower Leg: Kerlix and Coban - Bilateral Wound #3R Right,Posterior Calf: Kerlix and Coban - Bilateral Off-Loading: Open toe surgical shoe to: - left and right foot 1. Continue with Hydrofera Blue to all wound areas with kerlix and Coban bilaterally. 2. Fortunately we have good edema control 3. I have asked them to bring their juxta lite stockings last week. I am hopeful that the area is reopened as a result of inadequate tension in  the juxta lites last time. Otherwise I do not have a good explanation. We healed him out and he was literally back in 2 weeks Electronic Signature(s) Signed: 01/02/2020 6:13:52 PM By: [name] MD Entered By: [name] on 01/02/2020  09:17:22 -------------------------------------------------------------------------------- HxROS Details Patient Name: Date of Service: Daniel Pia D. 01/02/2020 8:00 AM Medical Record [id number] Patient Account Number: [id number] Date of Birth/Sex: Treating RN: 01-01-1941 (79 y.o. [name] Primary Care Provider: Jeralyn Ruths Other Clinician: Referring Provider: Treating Provider/Extender:Cammie Faulstich, [name], [facility] Weeks in Treatment: 39 Information Obtained From Patient Eyes Medical History: Positive for: Cataracts - Bilateral Negative for: Glaucoma; Optic Neuritis Ear/Nose/Mouth/Throat Medical History: Negative for: Chronic sinus problems/congestion; Middle ear problems Hematologic/Lymphatic Medical History: Negative for: Anemia; Human Immunodeficiency Virus; Lymphedema; Sickle Cell Disease Respiratory Medical History: Negative for: Aspiration; Asthma; Chronic Obstructive Pulmonary Disease (COPD); Pneumothorax; Sleep Apnea; Tuberculosis Cardiovascular Medical History: Positive for: Hypertension; Peripheral Venous Disease Negative for: Angina; Congestive Heart Failure; Deep Vein Thrombosis; Hypotension; Myocardial Infarction; Peripheral Arterial Disease; Phlebitis; Vasculitis Gastrointestinal Medical History: Negative for: Cirrhosis ; Colitis; Crohns; Hepatitis A; Hepatitis B; Hepatitis C Endocrine Medical History: Positive for: Type II Diabetes Time with diabetes: 10 years Blood sugar tested every day: No Genitourinary Medical History: Negative for: End Stage Renal Disease Immunological Medical History: Negative for: Lupus Erythematosus; Raynauds; Scleroderma Integumentary (Skin) Medical History: Negative for: History of Burn Musculoskeletal Medical History: Positive for: Gout - History of, Alpuranol Neurologic Medical History: Negative for: Dementia; Neuropathy; Quadriplegia; Paraplegia; Seizure Disorder Oncologic Medical History: Positive  for: Received Radiation - prostrate cancer Psychiatric Medical History: Negative for: Anorexia/bulimia; Confinement Anxiety HBO Extended History Items Eyes: Cataracts Immunizations Pneumococcal Vaccine: Received Pneumococcal Vaccination: Yes Immunization Notes: doesnt know when last tetanus shot was Implantable Devices None Hospitalization / Surgery History Type of Hospitalization/Surgery Cardiac shock treatment knee surgery right 2nd toe amputation left 2nd toe amputation Family and Social History Cancer: Yes - Siblings; Diabetes: Yes - Siblings; Heart Disease: Yes - Siblings; Hereditary Spherocytosis: No; Hypertension: Yes - Siblings; Kidney Disease: No; Lung Disease: No; Seizures: No; Stroke: Yes - Siblings; Thyroid Problems: No; Tuberculosis: No; Never smoker; Marital Status - Married; Alcohol Use: Never; Drug Use: No History; Caffeine Use: Rarely; Financial Concerns: No; Food, Clothing or Shelter Needs: No; Support System Lacking: No; Transportation Concerns: No Electronic Signature(s) Signed: 01/02/2020 6:13:52 PM By: [name] MD Signed: 01/02/2020 6:21:28 PM By: Baruch Gouty RN, BSN Entered By: Baruch Gouty on 01/02/2020 08:35:01 -------------------------------------------------------------------------------- [place] Details Patient Name: Date of Service: Armando Gang 01/02/2020 Medical Record [id number] Patient Account Number: [id number] Date of Birth/Sex: Treating RN: 1941/09/18 (79 y.o. [name]) Dolores Lory, Morey Hummingbird Primary Care Provider: Jeralyn Ruths Other Clinician: Referring Provider: Treating Provider/Extender:Lekita Kerekes, [name], [facility] Weeks in Treatment: 44 Diagnosis Coding ICD-10 Codes Code Description [phone] Non-pressure chronic ulcer of right calf limited to breakdown of skin L97.221 Non-pressure chronic ulcer of left calf limited to breakdown of skin I87.333 Chronic venous hypertension (idiopathic) with ulcer and inflammation of bilateral  lower extremity E11.51 Type 2 diabetes mellitus with diabetic peripheral angiopathy without gangrene E11.42 Type 2 diabetes mellitus with diabetic polyneuropathy L97.524 Non-pressure chronic ulcer of other part of left foot with necrosis of bone L97.511 Non-pressure chronic ulcer of other part of right foot limited to breakdown of skin G81.15 Chronic systolic (congestive) heart failure B26.203 Other chronic osteomyelitis, left ankle and foot Physician Procedures CPT4: Code [id number] Description: 213 - WC PHYS LEVEL 3 - EST PT ICD-10 Diagnosis Description L97.211 Non-pressure chronic ulcer of right  calf limited to brea L97.221 Non-pressure chronic ulcer of left calf limited to break I87.333 Chronic venous hypertension (idiopathic)  with ulcer and lower extremity Modifier: kdown of skin down of skin inflammation of b Quantity: 1 ilateral Electronic Signature(s) Signed: 01/02/2020 6:13:52 PM By: [name] MD Entered By: [name] on 01/02/2020 09:17:54

## 2020-01-03 NOTE — Progress Notes (Signed)
[name], [name] ([id number]) Visit Report for 01/02/2020 Arrival Information Details Patient Name: Date of Service: [name] 01/02/2020 8:00 AM Medical Record [place] Patient Account Number: [id number] Date of Birth/Sex: Treating RN: Jan 13, 1941 (79 y.o. [name] Primary Care Talli Kimmer: Jeralyn Ruths Other Clinician: Referring French Kendra: Treating Kenndra Morris/Extender:Robson, Donneta Romberg, [facility] Weeks in Treatment: 26 Visit Information History Since Last Visit All ordered tests and consults were completed: Yes Patient Arrived: Wheel Chair Added or deleted any medications: No Arrival Time: 08:12 Any new allergies or adverse reactions: No Accompanied By: self Had a fall or experienced change in No Transfer Assistance: None activities of daily living that may affect Patient Identification Verified: Yes risk of falls: Secondary Verification Process Yes Signs or symptoms of abuse/neglect since last No Completed: visito Patient Requires Transmission- No Hospitalized since last visit: Yes Based Precautions: Implantable device outside of the clinic excluding No Patient Has Alerts: Yes cellular tissue based products placed in the center Patient Alerts: Patient on Blood since last visit: Thinner Has Dressing in Place as Prescribed: Yes Has Compression in Place as Prescribed: Yes Pain Present Now: No Electronic Signature(s) Signed: 01/02/2020 6:21:28 PM By: Baruch Gouty RN, BSN Entered By: Baruch Gouty on 01/02/2020 08:13:35 -------------------------------------------------------------------------------- Encounter Discharge Information Details Patient Name: Date of Service: Daniel Pia D. 01/02/2020 8:00 AM Medical Record [id number] Patient Account Number: [id number] Date of Birth/Sex: Treating RN: [birth year] (78 y.o. [name] Primary Care Issaic Welliver: Jeralyn Ruths Other Clinician: Referring Tyjanae Bartek: Treating Tashanda Fuhrer/Extender:Robson,  Donneta Romberg, [facility] Weeks in Treatment: 33 Encounter Discharge Information Items Discharge Condition: Stable Ambulatory Status: Wheelchair Discharge Destination: Home Transportation: Private Auto Accompanied By: wife Schedule Follow-up Appointment: Yes Clinical Summary of Care: Patient Declined Electronic Signature(s) Signed: 01/02/2020 6:07:04 PM By: Kela Millin Entered By: Kela Millin on 01/02/2020 09:38:13 -------------------------------------------------------------------------------- Lower Extremity Assessment Details Patient Name: Date of Service: Daniel, Gross 01/02/2020 8:00 AM Medical Record [id number] Patient Account Number: [id number] Date of Birth/Sex: Treating RN: [birth year] (79 y.o. [name] Primary Care Asani Deniston: Jeralyn Ruths Other Clinician: Referring Nayellie Sanseverino: Treating Massie Mees/Extender:Robson, Donneta Romberg, [facility] Weeks in Treatment: 44 Edema Assessment Assessed: [Left: No] [Right: No] Edema: [Left: No] [Right: No] Calf Left: Right: Point of Measurement: 31 cm From Medial Instep 31.8 cm 31.6 cm Ankle Left: Right: Point of Measurement: 11 cm From Medial Instep 20.3 cm 20.6 cm Vascular Assessment Pulses: Dorsalis Pedis Palpable: [Left:Yes] [Right:No] Electronic Signature(s) Signed: 01/02/2020 6:21:28 PM By: Baruch Gouty RN, BSN Entered By: Baruch Gouty on 01/02/2020 08:27:16 -------------------------------------------------------------------------------- Multi Wound Chart Details Patient Name: Date of Service: Daniel Pia D. 01/02/2020 8:00 AM Medical Record [id number] Patient Account Number: [id number] Date of Birth/Sex: Treating RN: [birth year] (78 y.o. [name]) [name] Primary Care Loula Marcella: Jeralyn Ruths Other Clinician: Referring Carlisa Eble: Treating Rosette Bellavance/Extender:Robson, Donneta Romberg, [name] Weeks in Treatment: 44 Vital Signs Height(in): 74 Pulse(bpm): 21 Weight(lbs): 212 Blood Pressure(mmHg): 116/59 Body  Mass Index(BMI): 27 Temperature(F): 97.5 Respiratory 18 Rate(breaths/min): Photos: [19:No Photos] [24:No Photos] [25:No Photos] Wound Location: [19:Left, Anterior Lower Leg] [24:Left, Distal, Anterior Lower Left, Distal, Posterior Lower Leg] [25:Leg] Wounding Event: [19:Gradually Appeared] [24:Gradually Appeared] [25:Gradually Appeared] Primary Etiology: [19:Venous Leg Ulcer] [24:Venous Leg Ulcer] [25:Venous Leg Ulcer] Secondary Etiology: [19:N/A] [24:Diabetic Wound/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] [25:Lower Extremity] Comorbid History: [19:Cataracts, Hypertension, Cataracts, Hypertension, Cataracts, Hypertension, Peripheral Venous Disease, Peripheral Venous Disease, Peripheral Venous Disease, Type II Diabetes, Gout, Received Radiation] [24:Type II Diabetes, Gout,  Received Radiation] [25:Type II Diabetes, Gout, Received Radiation] Date Acquired: [19:09/23/2019] [24:10/04/2019] [25:10/04/2019] Weeks  of Treatment: [19:14] [24:12] [25:12] Wound Status: [19:Open] [24:Healed - Epithelialized] [25:Open] Wound Recurrence: [19:No] [24:No] [25:No] Clustered Wound: [19:No] [24:Yes] [25:Yes] Clustered Quantity: [19:1] [24:1] [25:1] Measurements L x W x D 0.9x0.6x0.1 [24:0x0x0] [25:1x0.5x0.1] (cm) Area (cm) : [19:0.424] [24:0] [25:0.393] Volume (cm) : [19:0.042] [24:0] [25:0.039] % Reduction in Area: [19:99.20%] [24:100.00%] [25:98.40%] % Reduction in Volume: 99.20% [24:100.00%] [25:98.40%] Classification: [19:Full Thickness Without Exposed Support Structures Exposed Support Structures Exposed Support Structures] [24:Full Thickness Without] [25:Full Thickness Without] Exudate Amount: [19:Small] [24:None Present] [25:Small] Exudate Type: [19:Serosanguineous] [24:N/A] [25:Serosanguineous] Exudate Color: [19:red, brown] [24:N/A] [25:red, brown] Wound Margin: [19:Flat and Intact] [24:Flat and Intact] [25:Flat and Intact] Granulation Amount: [19:Large (67-100%)] [24:None Present  (0%)] [25:Large (67-100%)] Granulation Quality: [19:Red] [24:N/A] [25:Pink] Necrotic Amount: [19:None Present (0%)] [24:None Present (0%)] [25:Small (1-33%)] Exposed Structures: [19:Fat Layer (Subcutaneous Fascia: No Tissue) Exposed: Yes Fascia: No Tendon: No Muscle: No Joint: No Bone: No] [24:Fat Layer (Subcutaneous Tissue) Exposed: Yes Tissue) Exposed: No Tendon: No Muscle: No Joint: No Bone: No] [25:Fat Layer (Subcutaneous  Fascia: No Tendon: No Muscle: No Joint: No Bone: No] Epithelialization: [19:Medium (34-66%) 3R] [24:Large (67-100%)] [25:Small (1-33%) N/A N/A] Photos: [19:No Photos] [24:N/A] [25:N/A] Wound Location: [19:Right, Posterior Calf] [24:N/A] [25:N/A] Wounding Event: [19:Gradually Appeared] [24:N/A] [25:N/A] Primary Etiology: [19:Diabetic Wound/Ulcer of the N/A Lower Extremity] [25:N/A] Secondary Etiology: [19:N/A] [24:N/A] [25:N/A] Comorbid History: [19:Cataracts, Hypertension, N/A Peripheral Venous Disease, Type II Diabetes, Gout, Received Radiation] [25:N/A] Date Acquired: [19:02/28/2019] [24:N/A] [25:N/A] Weeks of Treatment: [19:44] [24:N/A] [25:N/A] Wound Status: [19:Open] [24:N/A] [25:N/A] Wound Recurrence: [19:Yes] [24:N/A] [25:N/A] Clustered Wound: [19:No] [24:N/A] [25:N/A] Clustered Quantity: [19:N/A] [24:N/A] [25:N/A] Measurements L x W x D 0.4x0.3x0.1 [24:N/A] [25:N/A] (cm) Area (cm) : [19:0.094] [24:N/A] [25:N/A] Volume (cm) : [19:0.009] [24:N/A] [25:N/A] % Reduction in Area: [19:99.60%] [24:N/A] [25:N/A] % Reduction in Volume: 99.60% [24:N/A] [25:N/A] Classification: [19:Grade 2] [24:N/A] [25:N/A] Exudate Amount: [19:Small] [24:N/A] [25:N/A] Exudate Type: [19:Serosanguineous] [24:N/A] [25:N/A] Exudate Color: [19:red, brown] [24:N/A] [25:N/A] Wound Margin: [19:Flat and Intact] [24:N/A] [25:N/A] Granulation Amount: [19:Large (67-100%)] [24:N/A] [25:N/A] Granulation Quality: [19:Red] [24:N/A] [25:N/A] Necrotic Amount: [19:None Present (0%)] [24:N/A]  [25:N/A] Exposed Structures: [19:Fat Layer (Subcutaneous N/A Tissue) Exposed: Yes Fascia: No Tendon: No Muscle: No Joint: No Bone: No Large (67-100%)] [24:N/A] [25:N/A N/A] Treatment Notes Electronic Signature(s) Signed: 01/02/2020 6:13:52 PM By: [name] MD Signed: 01/02/2020 6:43:44 PM By: [name] RN Entered By: [name] on 01/02/2020 09:13:32 -------------------------------------------------------------------------------- Multi-Disciplinary Care Plan Details Patient Name: Date of Service: Daniel Pia D. 01/02/2020 8:00 AM Medical Record [id number] Patient Account Number: [id number] Date of Birth/Sex: Treating RN: 1941-05-21 (78 y.o. [name] Primary Care Analia Zuk: Jeralyn Ruths Other Clinician: Referring Nycole Kawahara: Treating Lazaria Schaben/Extender:Robson, Donneta Romberg, [name] Weeks in Treatment: 77 Active Inactive Wound/Skin Impairment Nursing Diagnoses: Knowledge deficit related to ulceration/compromised skin integrity Goals: Patient/caregiver will verbalize understanding of skin care regimen Date Initiated: 02/28/2019 Target Resolution Date: 01/30/2020 Goal Status: Active Ulcer/skin breakdown will have a volume reduction of 30% by week 4 Date Initiated: 02/28/2019 Date Inactivated: 04/04/2019 Target Resolution Date: 03/31/2019 Goal Status: Met Ulcer/skin breakdown will have a volume reduction of 50% by week 8 Date Initiated: 04/04/2019 Date Inactivated: 05/09/2019 Target Resolution Date: 05/05/2019 Goal Status: Met Ulcer/skin breakdown will have a volume reduction of 80% by week 12 Date Initiated: 05/09/2019 Date Inactivated: 06/13/2019 Target Resolution Date: 06/09/2019 Unmet Goal Status: Unmet Reason: comorbities/new wounds Ulcer/skin breakdown will heal within 14 weeks Date Initiated: 06/13/2019 Date Inactivated: 07/11/2019 Target Resolution Date: 07/07/2019 Unmet Goal Status: Unmet Reason: comorbityies Interventions: Assess patient/caregiver ability  to  obtain necessary supplies Assess patient/caregiver ability to perform ulcer/skin care regimen upon admission and as needed Assess ulceration(s) every visit Notes: Electronic Signature(s) Signed: 01/02/2020 6:43:44 PM By: [name] RN Entered By: [name] on 01/02/2020 08:03:17 -------------------------------------------------------------------------------- Pain Assessment Details Patient Name: Date of Service: RENAUD, CELLI 01/02/2020 8:00 AM Medical Record [id number] Patient Account Number: [id number] Date of Birth/Sex: Treating RN: [birth year] (79 y.o. [name] Primary Care Maylene Crocker: Jeralyn Ruths Other Clinician: Referring Chayden Garrelts: Treating Erron Wengert/Extender:Robson, Donneta Romberg, [place] Weeks in Treatment: 39 Active Problems Location of Pain Severity and Description of Pain Patient Has Paino No Patient Has Paino No Site Locations Rate the pain. Current Pain Level: 0 Pain Management and Medication Current Pain Management: Electronic Signature(s) Signed: 01/02/2020 6:21:28 PM By: Baruch Gouty RN, BSN Entered By: Baruch Gouty on 01/02/2020 08:25:13 -------------------------------------------------------------------------------- Patient/Caregiver Education Details Patient Name: Date of Service: Mehringer, Natnael D. 4/6/2021andnbsp8:00 AM Medical Record [id number] Patient Account Number: [id number] Date of Birth/Gender: Treating RN: Apr 11, 1941 (78 y.o. [name]) [name] Primary Care Physician: Jeralyn Ruths Other Clinician: Referring Physician: Treating Physician/Extender:Robson, Donneta Romberg, Arbie Cookey in Treatment: 67 Education Assessment Education Provided To: Patient Education Topics Provided Wound/Skin Impairment: Methods: Explain/Verbal Responses: State content correctly Electronic Signature(s) Signed: 01/02/2020 6:43:44 PM By: [name] RN Entered By: [name] on 01/02/2020  08:03:35 -------------------------------------------------------------------------------- Wound Assessment Details Patient Name: Date of Service: XUE, LOW 01/02/2020 8:00 AM Medical Record [id number] Patient Account Number: [id number] Date of Birth/Sex: Treating RN: 12/16/1940 (79 y.o. [name] Primary Care Kline Bulthuis: Jeralyn Ruths Other Clinician: Referring Babetta Paterson: Treating Gearld Kerstein/Extender:Robson, Donneta Romberg, [facility] Weeks in Treatment: 8 Wound Status Wound Number: 19 Primary Venous Leg Ulcer Etiology: Wound Location: Left, Anterior Lower Leg Wound Open Wounding Event: Gradually Appeared Status: Date Acquired: 09/23/2019 Comorbid Cataracts, Hypertension, Peripheral Venous Weeks Of Treatment: 14 History: Disease, Type II Diabetes, Gout, Received Clustered Wound: No Radiation Wound Measurements Length: (cm) 0.9 % Reduct Width: (cm) 0.6 % Reduct Depth: (cm) 0.1 Epitheli Clustered Quantity: 1 Tunnelin Area: (cm) 0.424 Undermi Volume: (cm) 0.042 Wound Description Classification: Full Thickness Without Exposed Support Foul Odo Structures Slough/F Wound Flat and Intact Margin: Exudate Small Amount: Exudate Serosanguineous Type: Exudate red, brown Color: Wound Bed Granulation Amount: Large (67-100%) Granulation Quality: Red Fascia E Necrotic Amount: None Present (0%) Fat Laye Tendon E Muscle E Joint Ex Bone Exp r After Cleansing: No ibrino No Exposed Structure xposed: No r (Subcutaneous Tissue) Exposed: Yes xposed: No xposed: No posed: No osed: No ion in Area: 99.2% ion in Volume: 99.2% alization: Medium (34-66%) g: No ning: No Treatment Notes Wound #19 (Left, Anterior Lower Leg) 1. Cleanse With Wound Cleanser Soap and water 2. Periwound Care TCA Cream 3. Primary Dressing Applied Hydrofera Blue 4. Secondary Dressing Dry Gauze 6. Support Layer Holiday representative) Signed: 01/02/2020 6:21:28 PM  By: Baruch Gouty RN, BSN Entered By: Baruch Gouty on 01/02/2020 08:32:12 -------------------------------------------------------------------------------- Wound Assessment Details Patient Name: Date of Service: TIQUAN, BOUCH 01/02/2020 8:00 AM Medical Record [id number] Patient Account Number: [id number] Date of Birth/Sex: Treating RN: 04-20-1941 (79 y.o. [name] Primary Care Lilyonna Steidle: Jeralyn Ruths Other Clinician: Referring Rain Wilhide: Treating Hiawatha Merriott/Extender:Robson, Donneta Romberg, [facility] Weeks in Treatment: 38 Wound Status Wound Number: 24 Primary Venous Leg Ulcer Etiology: Wound Location: Left, Distal, Anterior Lower Leg Secondary Diabetic Wound/Ulcer of the Lower Extremity Wounding Event: Gradually Appeared Etiology: Date Acquired: 10/04/2019 Wound Healed - Epithelialized Weeks Of Treatment: 12 Status: Clustered Wound: Yes Comorbid Cataracts, Hypertension, Peripheral Venous History:  Disease, Type II Diabetes, Gout, Received Radiation Wound Measurements Length: (cm) 0 % Redu Width: (cm) 0 % Redu Depth: (cm) 0 Epithe Clustered Quantity: 1 Tunnel Area: (cm) 0 Under Volume: (cm) 0 Wound Description Full Thickness Without Exposed Support Foul O Classification: Structures Slough Wound Flat and Intact Margin: Exudate None Present Amount: Wound Bed Granulation Amount: None Present (0%) Necrotic Amount: None Present (0%) Fascia Fat La Tendon Muscle Joint Bone E Electronic Signature(s) Signed: 01/02/2020 6:21:28 PM By: Baruch Gouty RN, BSN Entered By: Baruch Gouty on 04/0 dor After Cleansing: No Lynita Lombard No Exposed Structure Exposed: No yer (Subcutaneous Tissue) Exposed: No Exposed: No Exposed: No Exposed: No xposed: No 02/2020 08:32:37 ction in Area: 100% ction in Volume: 100% lialization: Large (67-100%) ing: No mining: No -------------------------------------------------------------------------------- Wound Assessment  Details Patient Name: Date of Service: TANOR, GLASPY 01/02/2020 8:00 AM Medical Record Number:[id number] Patient Account Number: [id number] Date of Birth/Sex: Treating RN: May 03, 1941 (79 y.o. [name] Primary Care Peri Kreft: Jeralyn Ruths Other Clinician: Referring Lorrayne Ismael: Treating Riniyah Speich/Extender:Robson, Donneta Romberg, [facility] Weeks in Treatment: 21 Wound Status Wound Number: 25 Primary Venous Leg Ulcer Etiology: Wound Location: Left, Distal, Posterior Lower Leg Secondary Diabetic Wound/Ulcer of the Lower Extremity Wounding Event: Gradually Appeared Etiology: Date Acquired: 10/04/2019 Wound Open Weeks Of Treatment: 12 Status: Clustered Wound: Yes Comorbid Cataracts, Hypertension, Peripheral Venous History: Disease, Type II Diabetes, Gout, Received Radiation Wound Measurements Length: (cm) 1 % Reduct Width: (cm) 0.5 % Reduct Depth: (cm) 0.1 Epitheli Clustered Quantity: 1 Tunnelin Area: (cm) 0.393 Undermi Volume: (cm) 0.039 Wound Description Classification: Full Thickness Without Exposed Support Foul Od Structures Slough/ Wound Flat and Intact Margin: Exudate Small Amount: Exudate Serosanguineous Type: Exudate red, brown Color: Wound Bed Granulation Amount: Large (67-100%) Granulation Quality: Pink Fascia Necrotic Amount: Small (1-33%) Fat Lay Necrotic Quality: Adherent Slough Tendon Muscle Joint E Bone Ex or After Cleansing: No Fibrino Yes Exposed Structure Exposed: No er (Subcutaneous Tissue) Exposed: Yes Exposed: No Exposed: No xposed: No posed: No ion in Area: 98.4% ion in Volume: 98.4% alization: Small (1-33%) g: No ning: No Treatment Notes Wound #25 (Left, Distal, Posterior Lower Leg) 1. Cleanse With Wound Cleanser Soap and water 2. Periwound Care TCA Cream 3. Primary Dressing Applied Hydrofera Blue 4. Secondary Dressing Dry Gauze 6. Support Layer Holiday representative) Signed: 01/02/2020 6:21:28 PM By:  Baruch Gouty RN, BSN Entered By: Baruch Gouty on 01/02/2020 08:32:58 -------------------------------------------------------------------------------- Wound Assessment Details Patient Name: Date of Service: COUGAR, IMEL 01/02/2020 8:00 AM Medical Record [id number] Patient Account Number: [id number] Date of Birth/Sex: Treating RN: 01/02/1941 (79 y.o. [name] Primary Care Magdelena Kinsella: Jeralyn Ruths Other Clinician: Referring Derrik Mceachern: Treating Chong Wojdyla/Extender:Robson, Donneta Romberg, [facility] Weeks in Treatment: 44 Wound Status Wound Number: [id number] Primary Diabetic Wound/Ulcer of the Lower Extremity Etiology: Wound Location: Right, Posterior Calf Wound Open Wounding Event: Gradually Appeared Status: Date Acquired: 02/28/2019 Comorbid Cataracts, Hypertension, Peripheral Venous Weeks Of Treatment: 44 History: Disease, Type II Diabetes, Gout, Received Clustered Wound: No Radiation Wound Measurements Length: (cm) 0.4 % Reductio Width: (cm) 0.3 % Reductio Depth: (cm) 0.1 Epithelial Area: (cm) 0.094 Tunneling Volume: (cm) 0.009 Undermini Wound Description Classification: Grade 2 Wound Margin: Flat and Intact Exudate Amount: Small Exudate Type: Serosanguineous Exudate Color: red, brown Wound Bed Granulation Amount: Large (67-100%) Granulation Quality: Red Necrotic Amount: None Present (0%) Foul Odor After Cleansing: No Slough/Fibrino No Exposed Structure Fascia Exposed: No Fat Layer (Subcutaneous Tissue) Exposed: Yes Tendon Exposed: No Muscle Exposed: No Joint Exposed: No Bone Exposed:  No n in Area: 99.6% n in Volume: 99.6% ization: Large (67-100%) : No ng: No Treatment Notes Wound #3R (Right, Posterior Calf) 1. Cleanse With Wound Cleanser Soap and water 2. Periwound Care TCA Cream 3. Primary Dressing Applied Hydrofera Blue 4. Secondary Dressing Dry Gauze 6. Support Layer Holiday representative) Signed: 01/02/2020 6:21:28 PM  By: Baruch Gouty RN, BSN Entered By: Baruch Gouty on 01/02/2020 08:33:18 -------------------------------------------------------------------------------- [place] Details Patient Name: Date of Service: Daniel Pia D. 01/02/2020 8:00 AM Medical Record [id number] Patient Account Number: [id number] Date of Birth/Sex: Treating RN: [birth year] (79 y.o. [name] Primary Care Zarriah Starkel: Jeralyn Ruths Other Clinician: Referring Maddalynn Barnard: Treating Manpreet Strey/Extender:Robson, Donneta Romberg, [facility] Weeks in Treatment: 25 Vital Signs Time Taken: 08:24 Temperature (F): 97.5 Height (in): 74 Pulse (bpm): 67 Source: Stated Respiratory Rate (breaths/min): 18 Weight (lbs): 212 Blood Pressure (mmHg): 116/59 Source: Stated Reference Range: 80 - 120 mg / dl Body Mass Index (BMI): 27.2 Electronic Signature(s) Signed: 01/02/2020 6:21:28 PM By: Baruch Gouty RN, BSN Entered By: Baruch Gouty on 01/02/2020 08:25:03

## 2020-01-03 NOTE — Patient Instructions (Signed)
Take warfarin 2 tablets tonight then resume 1 tablet daily except 1/2 tablet on Saturdays Recheck in 2 weeks

## 2020-01-03 NOTE — Progress Notes (Signed)
*  PRELIMINARY RESULTS* Echocardiogram 2D Echocardiogram has been performed.  Samuel Germany 01/03/2020, 12:35 PM

## 2020-01-04 NOTE — Progress Notes (Signed)
   Subjective:  Patient presents today status post bilateral 2nd toe amputation. DOS: 12/18/2019. He states he is doing well. He denies any pain or modifying factors. He has been using the post op shoes as directed. Patient is here for further evaluation and treatment.   Past Medical History:  Diagnosis Date  . Cancer ([place])   . CHF (congestive heart failure) ([place])   . Chronic atrial fibrillation ([place])   . Chronic bronchitis   . Diabetes mellitus, type II ([place])    no insulin  . Gout   . History of herpes zoster virus   . History of radiation therapy 12/21/12- 02/15/13   prostate 78 gray in 40 fx  . Hyperlipidemia   . Hypertension   . Prostate cancer ([place]) 2014   EBRT + hormonal therapy  . Vitamin D deficiency disease 06/07/2019      Objective/Physical Exam Neurovascular status intact.  Skin incisions appear to be well coapted with sutures and staples intact. No sign of infectious process noted. No dehiscence. No active bleeding noted. Moderate edema noted to the surgical extremity.  Assessment: 1. s/p bilateral 2nd toe amputations. DOS: 12/18/2019   Plan of Care:  1. Patient was evaluated 2. Recommended antibiotic ointment daily with a bandage.  3. Continue using post op shoes bilaterally.  4. Return to clinic in 2 weeks for suture removal.    Edrick Kins, DPM [facility]  Dr. Edrick Kins, [address]                                        [place], [place] [address]                Office [phone]  Fax [phone]

## 2020-01-05 ENCOUNTER — Telehealth: Payer: Self-pay | Admitting: *Deleted

## 2020-01-05 NOTE — Telephone Encounter (Signed)
Called patient with test results. No answer. Left message to call back.  

## 2020-01-05 NOTE — Telephone Encounter (Signed)
Left message returning call for results  

## 2020-01-05 NOTE — Telephone Encounter (Signed)
-----   Message from Erma Heritage, [place] sent at 01/04/2020 12:49 PM EDT ----- Please let the patient know that his repeat echocardiogram showed pumping function of the heart remains reduced. This was previously at 20 to 25% by imaging in 08/2019, now at 25 to 30%. I would recommend that he see Dr. Lovena Le to discuss ICD placement and possible CRT given his known reduced EF as he is at risk for cardiac arrhythmias. Results will be reviewed in detail at his appointment with Dr. Bronson Ing next week.

## 2020-01-09 ENCOUNTER — Encounter (HOSPITAL_BASED_OUTPATIENT_CLINIC_OR_DEPARTMENT_OTHER): Payer: Medicare HMO | Admitting: Internal Medicine

## 2020-01-09 ENCOUNTER — Other Ambulatory Visit: Payer: Self-pay

## 2020-01-09 DIAGNOSIS — E1142 Type 2 diabetes mellitus with diabetic polyneuropathy: Secondary | ICD-10-CM | POA: Diagnosis not present

## 2020-01-09 DIAGNOSIS — I87333 Chronic venous hypertension (idiopathic) with ulcer and inflammation of bilateral lower extremity: Secondary | ICD-10-CM | POA: Diagnosis not present

## 2020-01-09 DIAGNOSIS — L97221 Non-pressure chronic ulcer of left calf limited to breakdown of skin: Secondary | ICD-10-CM | POA: Diagnosis not present

## 2020-01-09 DIAGNOSIS — M86672 Other chronic osteomyelitis, left ankle and foot: Secondary | ICD-10-CM | POA: Diagnosis not present

## 2020-01-09 DIAGNOSIS — L97211 Non-pressure chronic ulcer of right calf limited to breakdown of skin: Secondary | ICD-10-CM | POA: Diagnosis not present

## 2020-01-09 DIAGNOSIS — L97511 Non-pressure chronic ulcer of other part of right foot limited to breakdown of skin: Secondary | ICD-10-CM | POA: Diagnosis not present

## 2020-01-09 DIAGNOSIS — I110 Hypertensive heart disease with heart failure: Secondary | ICD-10-CM | POA: Diagnosis not present

## 2020-01-09 DIAGNOSIS — L97524 Non-pressure chronic ulcer of other part of left foot with necrosis of bone: Secondary | ICD-10-CM | POA: Diagnosis not present

## 2020-01-09 DIAGNOSIS — E1151 Type 2 diabetes mellitus with diabetic peripheral angiopathy without gangrene: Secondary | ICD-10-CM | POA: Diagnosis not present

## 2020-01-09 DIAGNOSIS — E11622 Type 2 diabetes mellitus with other skin ulcer: Secondary | ICD-10-CM | POA: Diagnosis not present

## 2020-01-09 NOTE — Progress Notes (Signed)
[name], [name] ([id number]) Visit Report for 01/09/2020 HPI Details Patient Name: Date of Service: [name], [name] 01/09/2020 8:00 AM Medical Record Number:[id number] Patient Account Number: [id number] Date of Birth/Sex: Treating RN: 09/21/1941 (79 y.o. M) Gross, Daniel Primary Care Provider: GRAY, [name] Other Clinician: Referring Provider: Treating Provider/Extender:Robson, Michael GRAY, [name] Weeks in Treatment: 45 History of Present Illness Location: Patient presents with a wound to left lower leg. Quality: Patient reports No Pain. Duration: 2 months HPI Description: no cig or alcohol. spontaneous appearance in area of stasis dermamtitis. [name] on metformin only. chronic afib on Coumadin. diabetes and coag studies not good. hba1c 7.5. ivr 4.5. no pain or sxs of systemic disease. hx chf. no intermittent claudication 02/28/2019 Readmission This is a now a 79-year-old man who was previously cared for in 2016 by Dr. Arkin for wounds on his lower extremities. At that point he had venous reflux studies although I cannot seem to open these in [facility]. He had arterial studies showing an ABI of 1.11 on the right and 1.27 on the left his waveforms were triphasic bilaterally. He was discharged in stockings although I do not believe he is wearing these in some time. He tells me that about a month ago he noted openings of a large wound on the posterior right calf and 2 smaller areas on the left lateral calf and a small area more recently on the left posterior calf. He has been dressing these with peroxide and triple antibiotic ointment. He is not wearing compression. Past medical history; type 2 diabetes with peripheral neuropathy, chronic venous insufficiency, hypertension, cardiomyopathy, chronic atrial fibrillation on Coumadin, prostate cancer, hyperlipidemia, gout, ABI in our clinic was 1.34 on the left and not obtainable on the right 6/9; this is a patient who has chronic venous  insufficiency. He has a fairly substantial area on the right posterior calf, left lateral calf and a small area on the left posterior calf. On arrival last week he had very palpable popliteal and femoral pulses but nothing in his bilateral feet. Unfortunately we cannot get arterial studies until July 1 at Dr. Berry's office. They live in [place]. We use silver alginate under Kerlix Coban 6/16; patient with chronic venous insufficiency with wounds on his bilateral lower extremities. When he came into our clinic he was discovered to have a complete absence of peripheral pedal pulses at either the dorsalis pedis or posterior tibial. He does have easily palpable femoral and popliteal pulses. He sees Dr. Berry tomorrow for noninvasive arterial tests. He may also require venous reflux evaluation although I do not view this as an urgent thing. We have been using silver alginate. His wound surfaces of cleaned up quite nicely 6/23; patient with chronic venous insufficiency with wounds on his bilateral lower extremities. His wounds all are somewhat better looking. He did go to Dr. Berry's office but somehow ended up on the doctors schedule rather than being scheduled for noninvasive tests therefore his noninvasive tests are scheduled for July 1. We agree that he has venous insufficiency ulcers but I cannot feel any pulses in his lower extremities dictating the need for test. We are only using Kerlix and light Coban unfortunately this appears to be holding the edema 6/30; has his arterial studies tomorrow. We have been using Kerlix and light Coban will go to a more aggressive compression if the arterial studies will allow. We all agreed these are venous wounds however I cannot feel pulses at either the dorsalis pedis or posterior tibial   bilaterally. His wounds generally look some better including left lateral and right posterior. 7/7-Patient returns at 1 week in Kerlix/Coban to both legs, with improvement,  in the left lateral and right posterior lower leg wounds, ABI's are normal in both legs per vascular studies, TBI is also normal on both sides, we are using hydrofera blue to the wounds 7/14; patient's arterial studies from 2 weeks ago showed an ABI on the right at 1.03 with a TBI of 0.86. On the left the ABI was 1.06 with a TBI of 0.84. Notable for the fact that his arterial waveforms were monophasic in all of the lower extremity arteries suggesting some degree of arterial occlusive disease but in general this was felt to be fairly adequate for healing. His compression was increased from 2-3 layer which is appropriate. Dressing was changed to [facility] 7/21; patient's wounds are measuring smaller. The more substantial one on the right posterior calf, second 1 on the left lateral calf. Using [facility] on both wound areas 7/28; patient continues to make nice improvements. The area on the right posterior calf is smaller. Area on the left lateral calf also is smaller. We have been using Hydrofera Blue under compression. The patient will need compression stockings and we have measured him for these in the eventuality that these heal which really should not be too long from now 8/4-Patient continues to make improvement, the right posterior calf area smaller with rim of keratotic skin on one side, the left wound is definitely smaller and improving. 8/11-Returns at 1 week, after being in 3 layer compression on both legs, both wounds appear to be improving, making good progress, patient is happy, pain is also less especially in the right leg wound 8/18; the area on the left anterior lower leg is healed. On the right posterior leg the wound remains although the dimensions are a lot better. 8/25; he arrives in clinic today with a large body of open wound on the left lateral calf. All of the 3 wounds in this area are in close juxtaposition to each other. The story is that we discharged him last  week with no a wrap on the left leg. They went to [facility] could not get in as they are only excepting phone orders or online orders for stockings hence they did not put any stocking on the left leg all week. They have something at home but the patient with that was either incapable or just did not put them on. Apparently these opened 1 morning after getting out of bed. The area on the right has no real change 9/1; patient has bilateral lower extremity wounds in the setting of severe chronic venous insufficiency and secondary lymphedema. He arrived last week with new areas on the left lateral lower leg after we did not wrap him and he did not use his stockings. Nevertheless the areas on the left look better today under compression. Posterior right calf does not really changed. We are using Hydrofera Blue on both areas under compression 9/15; bilateral lower extremity wounds in the setting of severe chronic venous insufficiency and secondary lymphedema. He has 20 to 30 mmHg below-knee compression stockings under the eventuality that these close over. We did get the left leg to close but he did not transition to a stocking and this reopened. There are 2 open areas on the left posterior lateral calf and one on the right. Both of these look satisfactory. Using [facility] 9/22; bilateral lower extremity wounds in the  setting of chronic venous insufficiency. 2 superficial areas on the left lateral calf. One on the right just above the Achilles area. We have good edema control we have been using Hydrofera Blue 9/29; the areas on the left lateral calf are healed. On the right just above the Achilles and tendon area things look a lot better small wounds one scabbed area. We have been using Hydrofera Blue. We can discharge him in his own stocking on the left still wrapping on the right. This is the second time we have healed the left leg but he did not put a stocking on last time. Hopefully this will  maintain the edema from chronic venous disease with secondary lymphedema 10/6; he comes in today having a stocking on the left leg. They had trouble getting it on there is a lot of increase in swelling 2 small open areas one anteriorly and one on the medial calf. They report a lot of difficulty getting the stocking on. Paradoxically the area on the right that we have been wrapping posteriorly is closed 10/13; he comes in today with wounds bilaterally including superficial areas on the left medial and left lateral calf. As well as the right posterior has reopened in the Achilles area superiorly. He still does not have his juxta lite stockings although truthfully we would not of been able to use them today anyway. Apparently have been ordered and paid for from [employer] although they have not been delivered 10/20; his area on the right is just the boat closed on the right posterior. Still has the area on the left lateral and a very tiny area on the left medial. He has his bilateral juxta lites although he is not ready for them this week. He tolerated the increase to 4 layer compression last week quite well 10/27; the area on the right posterior calf is once again closed. He has a superficial area on the left lateral calf that is still open. He has been using Hydrofera Blue and bilateral 4-layer compression. He can change to his own juxta lite stocking on the right and we are instructing him today 11/3; the area on the right posterior calf reopened according to the patient and his wife after they took off the stocking when they got home last week. Apparently scabbed over there is now a fairly substantial wound which looks pretty much the same. Our intake nurse noted that they were using the juxta lite stockings appropriately. I was really hoping I might be able to close him out today. He has 1 very tiny remaining area on the left lateral lower leg. 11/10; right posterior calf wound measures smaller but  is still open. We have been using Hydrofera Blue. On the left he has a small oval-shaped wound and he seems to have had another wound distally that is open and likely a blister. We are using Hydrofera Blue under compression 11/17; right posterior calf wound continues to get better. We have been using Hydrofera Blue. On the left lateral one of the wounds has closed still a small open area. We have been using Hydrofera Blue on this as well. Both areas have been under 4-layer compression Arrives in clinic today with some swelling in the dorsal foot on the right some erythema of his forefoot and toes. Initially when I looked at this I almost thought this was a sunburn distal to a wrap injury. 12/1; right posterior calf wound debrided with a curette. We have been using Hydrofera Blue on the   left anterior lateral he has an area across the mid tibia. Finally a small area on the left lateral lower calf. Finally he continues to have de-epithelialized areas on the dorsal aspect of his toes. Initially thought this might be a burn injury when I saw him 2 weeks ago. I now wonder about tinea. I have also reviewed his arterial studies which were really quite good in July/20 with normal TBI's and ABIs but monophasic waveforms 12/8; comes in today with worsening problems especially on the left leg where he now has a cluster of wounds in the left anterior mid tibia. Very poor edema control. I reduced him to 3 layer from 4 layer compression last week because of some concern about blood flow to his toes however he does not have good edema control on the left leg. Right leg edema control looks satisfactory. On the left he has a cluster of wounds anteriorly, small area on the left medial fifth met head and then the collection of areas on his toes which appear better On the right he has the original area on the right posterior calf, a new area right medially. His formal arterial studies from mid July are noted below.  He was evaluated by Dr.Berry ABI Findings: +---------+------------------+-----+----------+--------+ Right Rt Pressure (mmHg)IndexWaveform Comment  +---------+------------------+-----+----------+--------+ Brachial 176     +---------+------------------+-----+----------+--------+ ATA 176 0.99 monophasic  +---------+------------------+-----+----------+--------+ PTA 183 1.03 monophasic  +---------+------------------+-----+----------+--------+ PERO 172 0.97 monophasic  +---------+------------------+-----+----------+--------+ Great Toe153 0.86    +---------+------------------+-----+----------+--------+ +---------+------------------+-----+-----------+-------+ Left Lt Pressure (mmHg)IndexWaveform Comment +---------+------------------+-----+-----------+-------+ Brachial 178     +---------+------------------+-----+-----------+-------+ ATA 162 0.91 multiphasic  +---------+------------------+-----+-----------+-------+ PTA 188 1.06 multiphasic  +---------+------------------+-----+-----------+-------+ PERO 158 0.89 monophasic   +---------+------------------+-----+-----------+-------+ Great Toe150 0.84    +---------+------------------+-----+-----------+-------+ +-------+-----------+-----------+------------+------------+ ABI/TBIToday's ABIToday's TBIPrevious ABIPrevious TBI +-------+-----------+-----------+------------+------------+ Right 1.03 0.86 1.11   +-------+-----------+-----------+------------+------------+ Left 1.06 0.84 1.27   +-------+-----------+-----------+------------+------------+ Tibial waveforms somewhat difficult to record due to irregular heartbeat. Bilateral ABIs appear essentially unchanged compared to prior study on 06/21/15. Summary: Right: Resting right ankle-brachial index is within normal range. No evidence of significant right lower extremity arterial disease. The right  toe-brachial index is normal. Although ankle brachial indices are within normal limits (0.95-1.29), arterial Doppler waveforms at the ankle suggest some component of arterial occlusive disease. Left: Resting left ankle-brachial index is within normal range. No evidence of significant left lower extremity arterial disease. The left toe-brachial index is normal. Although ankle brachial indices are within normal limits (0.95-1.29), arterial Doppler waveforms at the ankle suggest some component of arterial occlusive disease. 12/15; the patient's area on the left mid tibia looks better. Right posterior calf also better. He has the area on the left foot as well. All of his toes look better I think this was tinea. We are using Hydrofera Blue everywhere else The patient was in urgent care yesterday with wheezing and shortness of breath. He got azithromycin and prednisone. He feels better. His lungs are currently clear to auscultation. He was not tested for Covid 19 12/29; the patient arrives in clinic today with quite a bit change. 2 weeks ago he only had areas on the left mid tibia right posterior calf with tinea pedis resolving between his toes. He arrives in clinic today with several areas on the dorsal toes on the right, dorsal left second toe. He has skin breakdown in the left medial calf probably from excess edema. Small area proximally in the medial calf. He has weeping edema fluid coming out of the skin excoriations on the left medial calf. He tells  me that he is having a cardiac catheterization next week. I had a quick look at [place] link. He was found to have an ejection fraction of 25% during the work-up for persistent atrial fibrillation. He saw his cardiology office yesterday seen by the nurse practitioner. She increased his carvedilol. He has not been on diuretics for apparently several months and indeed in the nurse practitioner Laura Ingolds notes she had knowledge of this. 10/04/2019  on evaluation today patient presents as a walk-in visit concerning the fact that he did not have an appointment here for our clinic at this point. He actually had a cardiac catheterization earlier today and then came from there to here in order to be evaluated. With that being said unfortunately he is having significant cellulitis of his left lower extremity upon evaluation today this appears to have deteriorated even since last week's evaluation with Dr. Robson. The right lower extremity is actually doing okay I really see no evidence of deterioration at this point at those locations. In fact the right leg seems in general be doing quite well. Nonetheless I am concerned about infection and cellulitis of the left lower extremity and again considering his weakened heart I do not want him to develop into sepsis at all. He is also having some trouble breathing today and I understand according to nursing staff this is always the case to some degree. With that being said the patient unfortunately seems to be in my opinion a little bit worse even his wife feels like that may be the case today. Unsure exactly what is leading to this. Cardiac catheterization I did review the report which showed an ejection fraction of 25% he also had an LAD blockage of around 25% based on what I saw. With that being said there was no significant blockages that required stenting at this point he does have weakened cardiac muscles compared to normal. 10/05/2019; patient was seen yesterday in clinic. He was sent to the ER because of cellulitis of the left leg possibility. In the ER he was given 1 dose of IV Levaquin and discharged on Keflex. He came in the clinic initially for a nurse visit to rewrap his left leg. We did not look at the right leg today that is an Unna boot. The patient also had a cardiac cath. According to him there were no blockages but a very low ejection fraction. 1/12; back for an early follow-up. The  condition of the left lower leg is a lot better although there are multiple open areas. All of them with not a particularly viable surface. On the right posterior calf he has a single wound with a clean surface. He has a wound with exposed bone on the PIP of the left second toe dorsally. He has wounds on the dorsal right first second and third toes. His arterial studies were normal. 1/19; the patient has 3 open wounds on the left leg anteriorly in the mid tibia, distally and medially just above the ankle and posteriorly. On the right he has a small area dime sized on the right posterior calf. His edema control is a lot better. He still has wounds on the right second and left second toes. The left second toe has exposed bone. X- ray I did last week did not show evidence of osteomyelitis in the left foot. 1/26; patient with a multiplicity of wounds and problems. On the left he has a circular area on the left anterior mid tibia Left just above   the medical ankle -left 2nd toe pip -right 2nd toe right posterior calf 2/2; patient with a multiplicity of wounds and problems. He has severe chronic venous insufficiency and the wounds on his leg are all on the left left anterior left medial at the medial malleolus and left posterior. We have been using Iodoflex to these areas to help with ongoing debridement. He also has largely traumatic wounds on his toes this includes the left second with exposed bone. Bone culture I did last week showed Staph aureus which is methicillin sensitive. I discussed with him today the idea of an amputation of this toe because it is literally nonfunctional however he wants to try antibiotics. Antibiotic choice is complicated by the fact that he is on Coumadin. He also has wounds over the dorsal part of the right first which is close to closed. Right second toe has exposed bone and the right third toe at the base of the right third toe is just about closed as well. We have  been using silver alginate 2/9; patient with severe chronic venous insufficiency. He has large wounds on the left anterior mid tibia and on the left medial just above the ankle. I am concerned today about the depth of the area on the left mid tibia may be exposing into the muscle. He has small areas on the left posterior calf and on the right posterior calf although these do not look as threatening. He also has traumatic wounds on his toes. The right first and third are closed however the bilateral second toes have exposed bone. The area on the left is open into the distal interphalangeal joint. In my opinion these toes are nonsalvageable and will need amputation 2/16; patient with severe chronic venous insufficiency. He has wounds on the left anterior mid tibia left medial lower leg just above the ankle. He has small areas on the left posterior calf and a more prominent area on the right posterior calf. All of these are related to poorly controlled venous hypertension. He also had traumatic wounds on the PIPs of both second toes. Unfortunately both of these have exposed bone and in the case of the left this I think goes right into the joint itself. I do not think either 1 of these is salvageable. I have reviewed his arterial evaluation that was done in July last year. He also saw Dr. [name] in June. He felt these were venous stasis. Previously had segmental arterial pressures performed in 11/03/2014 which were normal. He was sent for for a follow-up arterial evaluation on July 1. On the right his ABIs were 1.03 with a TBI of 0.86 although his waveforms were monophasic on the left he had multiphasic waveforms at the ATA PTA but monophasic at the peroneal nevertheless his ABI was normal at 1.06 TBI also normal at 0.84. I think he has enough blood flow to heal an amputation which I think he needs of the left second toe and probably the right second toe as well 2/23; the patient is going for his  bilateral second toe amputation by Dr. Amalia Hailey on Friday. In the meantime is 3 areas on the left leg and the small area on the right posterior look better. We have been using silver collagen. 3/2; the patient's toe amputations were canceled because of cardiac concerns. Apparently this surgery will need to be done in the hospital although they do not have an appointment. In the meantime he is continues to have 3 areas on the left leg  one anteriorly and 2 smaller areas posteriorly on the left as well as the small posterior area on the right. We have been using silver collagen with compression 3/9; still no word on the second toe amputations bilaterally. 3 wounds on the left leg all look better and the area on the right posterior we have been using silver collagen I change this to [facility] 3/16; amputations next Monday both second toes he is apparently going to be hospitalized. He has on his left leg including left anterior and left lateral look better very superficial also the right posterior. 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 He continues to have left leg wounds anteriorly x2 posteriorly x1 at the left lower leg just above the ankle anteriorly x1 and posteriorly on the right x1 3/30; his anterior left leg wounds are considerably smaller. In fact the only wound that is not smaller is on the right. We have been using Hydrofera Blue under Kerlix Cobax 4/6; his anterior left leg wounds continue to contract in fact the area distally and medially has closed over. He still has the area anteriorly on the left. Both posterior calf wounds on the left and right are much smaller. We saw his surgical wound at the left second toe amputation site that still sutured it looks healthy, this is being followed by his podiatrist. We went over what compression he has he has bilateral juxta lites I am  hopeful the issue here was that we just did not have them tight enough because he broke down very quickly the last time we healed him out. 4/13; the patient has 3 remaining wounds one small area on the left anterior lower leg one on the left posterior and one on the right anterior. We have been using Hydrofera Blue under compression compression all of these are contracting. We did discuss life in the eventuality he will totally closed which seems likely in the next week or 2. He has bilateral juxta lite stockings. These will need to be set at 30-40 compression. Since we last closed him over for his bilateral lower extremity wounds he is also been treated for heart failure which may help. He undoubtedly has severe chronic venous hypertension however as well Electronic Signature(s) Signed: 01/09/2020 5:51:00 PM By: [name] MD Entered By: [name] on 01/09/2020 08:36:17 -------------------------------------------------------------------------------- Physical Exam Details Patient Name: Date of Service: Daniel Pia D. 01/09/2020 8:00 AM Medical Record [id number] Patient Account Number: [id number] Date of Birth/Sex: Treating RN: 1940/10/23 (79 y.o. [name] Primary Care Provider: Jeralyn Ruths Other Clinician: Referring Provider: Treating Provider/Extender:Rebecka Oelkers, Donneta Romberg, [facility] Weeks in Treatment: 45 Constitutional Sitting or standing Blood Pressure is within target range for patient.. Pulse regular and within target range for patient.Marland Kitchen Respirations regular, non-labored and within target range.. Temperature is normal and within the target range for the patient.Marland Kitchen Appears in no distress. Cardiovascular Pedal pulses are palpable. His edema control is excellent. Integumentary (Hair, Skin) Skin changes of chronic venous insufficiency. Notes Wound exam; all of his wounds are improved. He still has 3 small open areas 1 in left anterior one on the left posterior  and one on the right posterior. All of these look as though they are making progressive improvements in surface area and especially on the left posterior, although the wound bed is not 100% viable looking is difficult to justify debridement wound the surface  area so much better. Electronic Signature(s) Signed: 01/09/2020 5:51:00 PM By: Robson, Michael MD Entered By: Robson, Michael on 01/09/2020 08:43:30 -------------------------------------------------------------------------------- Physician Orders Details Patient Name: Date of Service: Colclasure, Aleksi D. 01/09/2020 8:00 AM Medical Record Number:[id number] Patient Account Number: [id number] Date of Birth/Sex: Treating RN: 01/15/1941 (79 y.o. M) Gross, Daniel Primary Care Provider: GRAY, [name] Other Clinician: Referring Provider: Treating Provider/Extender:Robson, Michael GRAY, [name] Weeks in Treatment: 45 Verbal / Phone Orders: No Diagnosis Coding ICD-10 Coding Code Description L97.211 Non-pressure chronic ulcer of right calf limited to breakdown of skin L97.221 Non-pressure chronic ulcer of left calf limited to breakdown of skin I87.333 Chronic venous hypertension (idiopathic) with ulcer and inflammation of bilateral lower extremity E11.51 Type 2 diabetes mellitus with diabetic peripheral angiopathy without gangrene E11.42 Type 2 diabetes mellitus with diabetic polyneuropathy L97.524 Non-pressure chronic ulcer of other part of left foot with necrosis of bone L97.511 Non-pressure chronic ulcer of other part of right foot limited to breakdown of skin I50.22 Chronic systolic (congestive) heart failure M86.672 Other chronic osteomyelitis, left ankle and foot Follow-up Appointments Return Appointment in 1 week. - Tuesday **********EXTRA TIME**************** Dressing Change Frequency Wound #19 Left,Anterior Lower Leg Do not change entire dressing for one week. Wound #25 Left,Distal,Posterior Lower Leg Do not change entire dressing for  one week. Wound #3R Right,Posterior Calf Do not change entire dressing for one week. Skin Barriers/Peri-Wound Care TCA Cream or Ointment Wound Cleansing May shower with protection. Primary Wound Dressing Wound #19 Left,Anterior Lower Leg Hydrofera Blue Wound #25 Left,Distal,Posterior Lower Leg Hydrofera Blue Wound #3R Right,Posterior Calf Hydrofera Blue Secondary Dressing Dry Gauze - secure toes with tape Edema Control Wound #19 Left,Anterior Lower Leg Kerlix and Coban - Bilateral Wound #25 Left,Distal,Posterior Lower Leg Kerlix and Coban - Bilateral Wound #3R Right,Posterior Calf Kerlix and Coban - Bilateral Avoid standing for long periods of time Elevate legs to the level of the heart or above for 30 minutes daily and/or when sitting, a frequency of: - throughout the day. Support Garment 30-40 mm/Hg pressure to: - patient to bring juxtalites stockings into wound center weekly. Off-Loading Open toe surgical shoe to: - left and right foot Electronic Signature(s) Signed: 01/09/2020 5:51:00 PM By: Robson, Michael MD Signed: 01/09/2020 6:03:05 PM By: Gross, Daniel Entered By: Gross, Daniel on 01/09/2020 08:28:34 -------------------------------------------------------------------------------- Problem List Details Patient Name: Date of Service: Petrosyan, Mazin D. 01/09/2020 8:00 AM Medical Record Number:[id number] Patient Account Number: [id number] Date of Birth/Sex: Treating RN: 11/09/1940 (79 y.o. M) Gross, Daniel Primary Care Provider: GRAY, [name] Other Clinician: Referring Provider: Treating Provider/Extender:Robson, Michael GRAY, [name] Weeks in Treatment: 45 Active Problems ICD-10 Evaluated Encounter Code Description Active Date Today Diagnosis L97.211 Non-pressure chronic ulcer of right calf limited to 02/28/2019 No Yes breakdown of skin L97.221 Non-pressure chronic ulcer of left calf limited to 02/28/2019 No Yes breakdown of skin I87.333 Chronic venous hypertension  (idiopathic) with ulcer 02/28/2019 No Yes and inflammation of bilateral lower extremity E11.51 Type 2 diabetes mellitus with diabetic peripheral 02/28/2019 No Yes angiopathy without gangrene E11.42 Type 2 diabetes mellitus with diabetic polyneuropathy 02/28/2019 No Yes L97.524 Non-pressure chronic ulcer of other part of left foot 10/10/2019 No Yes with necrosis of bone L97.511 Non-pressure chronic ulcer of other part of right foot 09/26/2019 No Yes limited to breakdown of skin I50.22 Chronic systolic (congestive) heart failure 10/05/2019 No Yes M86.672 Other chronic osteomyelitis, left ankle and foot 10/31/2019 No Yes Inactive Problems ICD-10 Code Description Active Date Inactive Date B35.3 Tinea pedis 09/05/2019 09/05/2019 L97.521 Non-pressure chronic ulcer   of other part of left foot limited to 09/26/2019 09/26/2019 breakdown of skin Resolved Problems Electronic Signature(s) Signed: 01/09/2020 5:51:00 PM By: [name] MD Entered By: [name] on 01/09/2020 08:34:50 -------------------------------------------------------------------------------- Progress Note Details Patient Name: Date of Service: Daniel Pia D. 01/09/2020 8:00 AM Medical Record [id number] Patient Account Number: [id number] Date of Birth/Sex: Treating RN: 1941/03/09 (79 y.o. [name] Primary Care Provider: Jeralyn Ruths Other Clinician: Referring Provider: Treating Provider/Extender:Towana Stenglein, Donneta Romberg, [facility] Weeks in Treatment: 45 Subjective History of Present Illness (HPI) The following HPI elements were documented for the patient's wound: Location: Patient presents with a wound to left lower leg. Quality: Patient reports No Pain. Duration: 2 months no cig or alcohol. spontaneous appearance in area of stasis dermamtitis. [name] on metformin only. chronic afib on Coumadin. diabetes and coag studies not good. hba1c 7.5. ivr 4.5. no pain or sxs of systemic disease. hx chf. no intermittent  claudication 02/28/2019 Readmission This is a now a 79 year old man who was previously cared for in 2016 by Dr. Lindon Romp for wounds on his lower extremities. At that point he had venous reflux studies although I cannot seem to open these in [place] link. He had arterial studies showing an ABI of 1.11 on the right and 1.27 on the left his waveforms were triphasic bilaterally. He was discharged in stockings although I do not believe he is wearing these in some time. He tells me that about a month ago he noted openings of a large wound on the posterior right calf and 2 smaller areas on the left lateral calf and a small area more recently on the left posterior calf. He has been dressing these with peroxide and triple antibiotic ointment. He is not wearing compression. Past medical history; type 2 diabetes with peripheral neuropathy, chronic venous insufficiency, hypertension, cardiomyopathy, chronic atrial fibrillation on Coumadin, prostate cancer, hyperlipidemia, gout, ABI in our clinic was 1.34 on the left and not obtainable on the right 6/9; this is a patient who has chronic venous insufficiency. He has a fairly substantial area on the right posterior calf, left lateral calf and a small area on the left posterior calf. On arrival last week he had very palpable popliteal and femoral pulses but nothing in his bilateral feet. Unfortunately we cannot get arterial studies until July 1 at Dr. Kennon Holter office. They live in [place]. We use silver alginate under Kerlix Coban 6/16; patient with chronic venous insufficiency with wounds on his bilateral lower extremities. When he came into our clinic he was discovered to have a complete absence of peripheral pedal pulses at either the dorsalis pedis or posterior tibial. He does have easily palpable femoral and popliteal pulses. He sees Dr. [name] tomorrow for noninvasive arterial tests. He may also require venous reflux evaluation although I do not view this  as an urgent thing. We have been using silver alginate. His wound surfaces of cleaned up quite nicely 6/23; patient with chronic venous insufficiency with wounds on his bilateral lower extremities. His wounds all are somewhat better looking. He did go to Dr. Kennon Holter office but somehow ended up on the doctors schedule rather than being scheduled for noninvasive tests therefore his noninvasive tests are scheduled for July 1. We agree that he has venous insufficiency ulcers but I cannot feel any pulses in his lower extremities dictating the need for test. We are only using Kerlix and light Coban unfortunately this appears to be holding the edema 6/30; has his arterial studies tomorrow. We have been using  Kerlix and light Coban will go to a more aggressive compression if the arterial studies will allow. We all agreed these are venous wounds however I cannot feel pulses at either the dorsalis pedis or posterior tibial bilaterally. His wounds generally look some better including left lateral and right posterior. 7/7-Patient returns at 1 week in Kerlix/Coban to both legs, with improvement, in the left lateral and right posterior lower leg wounds, ABI's are normal in both legs per vascular studies, TBI is also normal on both sides, we are using hydrofera blue to the wounds 7/14; patient's arterial studies from 2 weeks ago showed an ABI on the right at 1.03 with a TBI of 0.86. On the left the ABI was 1.06 with a TBI of 0.84. Notable for the fact that his arterial waveforms were monophasic in all of the lower extremity arteries suggesting some degree of arterial occlusive disease but in general this was felt to be fairly adequate for healing. His compression was increased from 2-3 layer which is appropriate. Dressing was changed to Hydrofera Blue 7/21; patient's wounds are measuring smaller. The more substantial one on the right posterior calf, second 1 on the left lateral calf. Using Hydrofera Blue on  both wound areas 7/28; patient continues to make nice improvements. The area on the right posterior calf is smaller. Area on the left lateral calf also is smaller. We have been using Hydrofera Blue under compression. The patient will need compression stockings and we have measured him for these in the eventuality that these heal which really should not be too long from now 8/4-Patient continues to make improvement, the right posterior calf area smaller with rim of keratotic skin on one side, the left wound is definitely smaller and improving. 8/11-Returns at 1 week, after being in 3 layer compression on both legs, both wounds appear to be improving, making good progress, patient is happy, pain is also less especially in the right leg wound 8/18; the area on the left anterior lower leg is healed. On the right posterior leg the wound remains although the dimensions are a lot better. 8/25; he arrives in clinic today with a large body of open wound on the left lateral calf. All of the 3 wounds in this area are in close juxtaposition to each other. The story is that we discharged him last week with no a wrap on the left leg. They went to [place] could not get in as they are only excepting phone orders or online orders for stockings hence they did not put any stocking on the left leg all week. They have something at home but the patient with that was either incapable or just did not put them on. Apparently these opened 1 morning after getting out of bed. The area on the right has no real change 9/1; patient has bilateral lower extremity wounds in the setting of severe chronic venous insufficiency and secondary lymphedema. He arrived last week with new areas on the left lateral lower leg after we did not wrap him and he did not use his stockings. Nevertheless the areas on the left look better today under compression. Posterior right calf does not really changed. We are using Hydrofera Blue on both  areas under compression 9/15; bilateral lower extremity wounds in the setting of severe chronic venous insufficiency and secondary lymphedema. He has 20 to 30 mmHg below-knee compression stockings under the eventuality that these close over. We did get the left leg to close but he did not transition   to a stocking and this reopened. There are 2 open areas on the left posterior lateral calf and one on the right. Both of these look satisfactory. Using Hydrofera Blue 9/22; bilateral lower extremity wounds in the setting of chronic venous insufficiency. 2 superficial areas on the left lateral calf. One on the right just above the Achilles area. We have good edema control we have been using Hydrofera Blue 9/29; the areas on the left lateral calf are healed. On the right just above the Achilles and tendon area things look a lot better small wounds one scabbed area. We have been using Hydrofera Blue. We can discharge him in his own stocking on the left still wrapping on the right. This is the second time we have healed the left leg but he did not put a stocking on last time. Hopefully this will maintain the edema from chronic venous disease with secondary lymphedema 10/6; he comes in today having a stocking on the left leg. They had trouble getting it on there is a lot of increase in swelling 2 small open areas one anteriorly and one on the medial calf. They report a lot of difficulty getting the stocking on. ooParadoxically the area on the right that we have been wrapping posteriorly is closed 10/13; he comes in today with wounds bilaterally including superficial areas on the left medial and left lateral calf. As well as the right posterior has reopened in the Achilles area superiorly. He still does not have his juxta lite stockings although truthfully we would not of been able to use them today anyway. Apparently have been ordered and paid for from [employer] although they have not been delivered 10/20;  his area on the right is just the boat closed on the right posterior. Still has the area on the left lateral and a very tiny area on the left medial. He has his bilateral juxta lites although he is not ready for them this week. He tolerated the increase to 4 layer compression last week quite well 10/27; the area on the right posterior calf is once again closed. He has a superficial area on the left lateral calf that is still open. He has been using Hydrofera Blue and bilateral 4-layer compression. He can change to his own juxta lite stocking on the right and we are instructing him today 11/3; the area on the right posterior calf reopened according to the patient and his wife after they took off the stocking when they got home last week. Apparently scabbed over there is now a fairly substantial wound which looks pretty much the same. Our intake nurse noted that they were using the juxta lite stockings appropriately. I was really hoping I might be able to close him out today. He has 1 very tiny remaining area on the left lateral lower leg. 11/10; right posterior calf wound measures smaller but is still open. We have been using Hydrofera Blue. On the left he has a small oval-shaped wound and he seems to have had another wound distally that is open and likely a blister. We are using Hydrofera Blue under compression 11/17; right posterior calf wound continues to get better. We have been using Hydrofera Blue. On the left lateral one of the wounds has closed still a small open area. We have been using Hydrofera Blue on this as well. Both areas have been under 4-layer compression Arrives in clinic today with some swelling in the dorsal foot on the right some erythema of his forefoot and   toes. Initially when I looked at this I almost thought this was a sunburn distal to a wrap injury. 12/1; right posterior calf wound debrided with a curette. We have been using Hydrofera Blue on the left anterior lateral  he has an area across the mid tibia. Finally a small area on the left lateral lower calf. Finally he continues to have de-epithelialized areas on the dorsal aspect of his toes. Initially thought this might be a burn injury when I saw him 2 weeks ago. I now wonder about tinea. I have also reviewed his arterial studies which were really quite good in July/20 with normal TBI's and ABIs but monophasic waveforms 12/8; comes in today with worsening problems especially on the left leg where he now has a cluster of wounds in the left anterior mid tibia. Very poor edema control. I reduced him to 3 layer from 4 layer compression last week because of some concern about blood flow to his toes however he does not have good edema control on the left leg. Right leg edema control looks satisfactory. ooOn the left he has a cluster of wounds anteriorly, small area on the left medial fifth met head and then the collection of areas on his toes which appear better ooOn the right he has the original area on the right posterior calf, a new area right medially. His formal arterial studies from mid July are noted below. He was evaluated by Dr.Berry ABI Findings: +---------+------------------+-----+----------+--------+ Right Rt Pressure (mmHg)IndexWaveform Comment  +---------+------------------+-----+----------+--------+ Brachial 176     +---------+------------------+-----+----------+--------+ ATA 176 0.99 monophasic  +---------+------------------+-----+----------+--------+ PTA 183 1.03 monophasic  +---------+------------------+-----+----------+--------+ PERO 172 0.97 monophasic  +---------+------------------+-----+----------+--------+ Great Toe153 0.86    +---------+------------------+-----+----------+--------+ +---------+------------------+-----+-----------+-------+ Left Lt Pressure (mmHg)IndexWaveform  Comment +---------+------------------+-----+-----------+-------+ Brachial 178     +---------+------------------+-----+-----------+-------+ ATA 162 0.91 multiphasic  +---------+------------------+-----+-----------+-------+ PTA 188 1.06 multiphasic  +---------+------------------+-----+-----------+-------+ PERO 158 0.89 monophasic   +---------+------------------+-----+-----------+-------+ Great Toe150 0.84    +---------+------------------+-----+-----------+-------+ +-------+-----------+-----------+------------+------------+ ABI/TBIToday's ABIToday's TBIPrevious ABIPrevious TBI +-------+-----------+-----------+------------+------------+ Right 1.03 0.86 1.11   +-------+-----------+-----------+------------+------------+ Left 1.06 0.84 1.27   +-------+-----------+-----------+------------+------------+ Tibial waveforms somewhat difficult to record due to irregular heartbeat. Bilateral ABIs appear essentially unchanged compared to prior study on 06/21/15. Summary: Right: Resting right ankle-brachial index is within normal range. No evidence of significant right lower extremity arterial disease. The right toe-brachial index is normal. Although ankle brachial indices are within normal limits (0.95-1.29), arterial Doppler waveforms at the ankle suggest some component of arterial occlusive disease. Left: Resting left ankle-brachial index is within normal range. No evidence of significant left lower extremity arterial disease. The left toe-brachial index is normal. Although ankle brachial indices are within normal limits (0.95-1.29), arterial Doppler waveforms at the ankle suggest some component of arterial occlusive disease. 12/15; the patient's area on the left mid tibia looks better. Right posterior calf also better. He has the area on the left foot as well. All of his toes look better I think this was tinea. We are using Hydrofera Blue everywhere  else The patient was in urgent care yesterday with wheezing and shortness of breath. He got azithromycin and prednisone. He feels better. His lungs are currently clear to auscultation. He was not tested for Covid 19 12/29; the patient arrives in clinic today with quite a bit change. 2 weeks ago he only had areas on the left mid tibia right posterior calf with tinea pedis resolving between his toes. He arrives in clinic today with several areas on the dorsal toes on the right, dorsal left second toe. He  has skin breakdown in the left medial calf probably from excess edema. Small area proximally in the medial calf. He has weeping edema fluid coming out of the skin excoriations on the left medial calf. He tells me that he is having a cardiac catheterization next week. I had a quick look at [place] link. He was found to have an ejection fraction of 25% during the work-up for persistent atrial fibrillation. He saw his cardiology office yesterday seen by the nurse practitioner. She increased his carvedilol. He has not been on diuretics for apparently several months and indeed in the nurse practitioner Laura Ingolds notes she had knowledge of this. 10/04/2019 on evaluation today patient presents as a walk-in visit concerning the fact that he did not have an appointment here for our clinic at this point. He actually had a cardiac catheterization earlier today and then came from there to here in order to be evaluated. With that being said unfortunately he is having significant cellulitis of his left lower extremity upon evaluation today this appears to have deteriorated even since last week's evaluation with Dr. Robson. The right lower extremity is actually doing okay I really see no evidence of deterioration at this point at those locations. In fact the right leg seems in general be doing quite well. Nonetheless I am concerned about infection and cellulitis of the left lower extremity and again  considering his weakened heart I do not want him to develop into sepsis at all. He is also having some trouble breathing today and I understand according to nursing staff this is always the case to some degree. With that being said the patient unfortunately seems to be in my opinion a little bit worse even his wife feels like that may be the case today. Unsure exactly what is leading to this. Cardiac catheterization I did review the report which showed an ejection fraction of 25% he also had an LAD blockage of around 25% based on what I saw. With that being said there was no significant blockages that required stenting at this point he does have weakened cardiac muscles compared to normal. 10/05/2019; patient was seen yesterday in clinic. He was sent to the ER because of cellulitis of the left leg possibility. In the ER he was given 1 dose of IV Levaquin and discharged on Keflex. He came in the clinic initially for a nurse visit to rewrap his left leg. We did not look at the right leg today that is an Unna boot. The patient also had a cardiac cath. According to him there were no blockages but a very low ejection fraction. 1/12; back for an early follow-up. The condition of the left lower leg is a lot better although there are multiple open areas. All of them with not a particularly viable surface. On the right posterior calf he has a single wound with a clean surface. He has a wound with exposed bone on the PIP of the left second toe dorsally. He has wounds on the dorsal right first second and third toes. His arterial studies were normal. 1/19; the patient has 3 open wounds on the left leg anteriorly in the mid tibia, distally and medially just above the ankle and posteriorly. On the right he has a small area dime sized on the right posterior calf. His edema control is a lot better. He still has wounds on the right second and left second toes. The left second toe has exposed bone. X- ray I did last    week did not show evidence of osteomyelitis in the left foot. 1/26; patient with a multiplicity of wounds and problems. ooOn the left he has a circular area on the left anterior mid tibia ooLeft just above the medical ankle -left 2nd toe pip -right 2nd toe right posterior calf 2/2; patient with a multiplicity of wounds and problems. He has severe chronic venous insufficiency and the wounds on his leg are all on the left left anterior left medial at the medial malleolus and left posterior. We have been using Iodoflex to these areas to help with ongoing debridement. He also has largely traumatic wounds on his toes this includes the left second with exposed bone. Bone culture I did last week showed Staph aureus which is methicillin sensitive. I discussed with him today the idea of an amputation of this toe because it is literally nonfunctional however he wants to try antibiotics. Antibiotic choice is complicated by the fact that he is on Coumadin. He also has wounds over the dorsal part of the right first which is close to closed. Right second toe has exposed bone and the right third toe at the base of the right third toe is just about closed as well. We have been using silver alginate 2/9; patient with severe chronic venous insufficiency. He has large wounds on the left anterior mid tibia and on the left medial just above the ankle. I am concerned today about the depth of the area on the left mid tibia may be exposing into the muscle. He has small areas on the left posterior calf and on the right posterior calf although these do not look as threatening. He also has traumatic wounds on his toes. The right first and third are closed however the bilateral second toes have exposed bone. The area on the left is open into the distal interphalangeal joint. In my opinion these toes are nonsalvageable and will need amputation 2/16; patient with severe chronic venous insufficiency. He has wounds on the  left anterior mid tibia left medial lower leg just above the ankle. He has small areas on the left posterior calf and a more prominent area on the right posterior calf. All of these are related to poorly controlled venous hypertension. He also had traumatic wounds on the PIPs of both second toes. Unfortunately both of these have exposed bone and in the case of the left this I think goes right into the joint itself. I do not think either 1 of these is salvageable. I have reviewed his arterial evaluation that was done in July last year. He also saw Dr. [name] in June. He felt these were venous stasis. Previously had segmental arterial pressures performed in 11/03/2014 which were normal. He was sent for for a follow-up arterial evaluation on July 1. On the right his ABIs were 1.03 with a TBI of 0.86 although his waveforms were monophasic on the left he had multiphasic waveforms at the ATA PTA but monophasic at the peroneal nevertheless his ABI was normal at 1.06 TBI also normal at 0.84. I think he has enough blood flow to heal an amputation which I think he needs of the left second toe and probably the right second toe as well 2/23; the patient is going for his bilateral second toe amputation by Dr. Amalia Hailey on Friday. In the meantime is 3 areas on the left leg and the small area on the right posterior look better. We have been using silver collagen. 3/2; the patient's toe amputations were canceled  because of cardiac concerns. Apparently this surgery will need to be done in the hospital although they do not have an appointment. In the meantime he is continues to have 3 areas on the left leg one anteriorly and 2 smaller areas posteriorly on the left as well as the small posterior area on the right. We have been using silver collagen with compression 3/9; still no word on the second toe amputations bilaterally. 3 wounds on the left leg all look better and the area on the right posterior we have been using  silver collagen I change this to [facility] 3/16; amputations next Monday both second toes he is apparently going to be hospitalized. He has on his left leg including left anterior and left lateral look better very superficial also the right posterior. 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 He continues to have left leg wounds anteriorly x2 posteriorly x1 at the left lower leg just above the ankle anteriorly x1 and posteriorly on the right x1 3/30; his anterior left leg wounds are considerably smaller. In fact the only wound that is not smaller is on the right. We have been using Hydrofera Blue under Kerlix Cobax 4/6; his anterior left leg wounds continue to contract in fact the area distally and medially has closed over. He still has the area anteriorly on the left. Both posterior calf wounds on the left and right are much smaller. We saw his surgical wound at the left second toe amputation site that still sutured it looks healthy, this is being followed by his podiatrist. We went over what compression he has he has bilateral juxta lites I am hopeful the issue here was that we just did not have them tight enough because he broke down very quickly the last time we healed him out. 4/13; the patient has 3 remaining wounds one small area on the left anterior lower leg one on the left posterior and one on the right anterior. We have been using Hydrofera Blue under compression compression all of these are contracting. We did discuss life in the eventuality he will totally closed which seems likely in the next week or 2. He has bilateral juxta lite stockings. These will need to be set at 30-40 compression. Since we last closed him over for his bilateral lower extremity wounds he is also been treated for heart failure which may help. He undoubtedly has severe chronic venous hypertension however  as well Objective Constitutional Sitting or standing Blood Pressure is within target range for patient.. Pulse regular and within target range for patient.Marland Kitchen Respirations regular, non-labored and within target range.. Temperature is normal and within the target range for the patient.Marland Kitchen Appears in no distress. Vitals Time Taken: 8:07 AM, Height: 74 in, Weight: 212 lbs, BMI: 27.2, Temperature: 97.7 F, Pulse: 100 bpm, Respiratory Rate: 18 breaths/min, Blood Pressure: 108/69 mmHg. Cardiovascular Pedal pulses are palpable. His edema control is excellent. General Notes: Wound exam; all of his wounds are improved. He still has 3 small open areas 1 in left anterior one on the left posterior and one on the right posterior. All of these look as though they are making progressive improvements in surface area and especially on the left posterior, although the wound bed is not 100% viable looking is difficult to justify debridement wound the surface area so much better. Integumentary (Hair, Skin) Skin changes of chronic venous  insufficiency. Wound #19 status is Open. Original cause of wound was Gradually Appeared. The wound is located on the Left,Anterior Lower Leg. The wound measures 0.4cm length x 0.3cm width x 0.1cm depth; 0.094cm^2 area and 0.009cm^3 volume. There is Fat Layer (Subcutaneous Tissue) Exposed exposed. There is no tunneling or undermining noted. There is a small amount of serosanguineous drainage noted. The wound margin is flat and intact. There is large (67-100%) red granulation within the wound bed. There is no necrotic tissue within the wound bed. Wound #25 status is Open. Original cause of wound was Gradually Appeared. The wound is located on the Left,Distal,Posterior Lower Leg. The wound measures 0.8cm length x 0.4cm width x 0.2cm depth; 0.251cm^2 area and 0.05cm^3 volume. There is Fat Layer (Subcutaneous Tissue) Exposed exposed. There is no tunneling or undermining noted. There is  a small amount of serosanguineous drainage noted. The wound margin is flat and intact. There is large (67-100%) pink granulation within the wound bed. There is a small (1-33%) amount of necrotic tissue within the wound bed including Adherent Slough. Wound #3R status is Open. Original cause of wound was Gradually Appeared. The wound is located on the Right,Posterior Calf. The wound measures 0.5cm length x 0.3cm width x 0.1cm depth; 0.118cm^2 area and 0.012cm^3 volume. There is Fat Layer (Subcutaneous Tissue) Exposed exposed. There is no tunneling or undermining noted. There is a small amount of serosanguineous drainage noted. The wound margin is flat and intact. There is large (67-100%) red granulation within the wound bed. There is no necrotic tissue within the wound bed. 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 - Tuesday **********EXTRA TIME**************** Dressing Change Frequency: Wound #19 Left,Anterior Lower Leg: Do not change entire dressing for one week. Wound #25 Left,Distal,Posterior Lower Leg: Do not change entire dressing for one week. Wound #3R Right,Posterior Calf: Do not change entire dressing for one week. Skin Barriers/Peri-Wound Care: TCA Cream or Ointment Wound Cleansing: May shower with  protection. Primary Wound Dressing: Wound #19 Left,Anterior Lower Leg: Hydrofera Blue Wound #25 Left,Distal,Posterior Lower Leg: Hydrofera Blue Wound #3R Right,Posterior Calf: Hydrofera Blue Secondary Dressing: Dry Gauze - secure toes with tape Edema Control: Wound #19 Left,Anterior Lower Leg: Kerlix and Coban - Bilateral Wound #25 Left,Distal,Posterior Lower Leg: Kerlix and Coban - Bilateral Wound #3R Right,Posterior Calf: Kerlix and Coban - Bilateral Avoid standing for long periods of time Elevate legs to the level of the heart or above for 30 minutes daily and/or when sitting, a frequency of: - throughout the day. Support Garment 30-40 mm/Hg pressure to: - patient to bring juxtalites stockings into wound center weekly. Off-Loading: Open toe surgical shoe to: - left and right foot 1. Hydrofera Blue to the 3 remaining wound areas still under kerlixand Coban. 2. Since the last time we healed him out he was diagnosed with congestive heart failure and is being treated. Apparently this is a nonischemic cardiomyopathy thought possibly to be tachycardic related although his most recent  echocardiogram only showed an increase of 5% according to his wife. Nevertheless that may result in less fluid accumulation in his legs 3; nevertheless he has severe chronic venous insufficiency with hemosiderin deposition. He is going to need stockings lifelong Electronic Signature(s) Signed: 01/09/2020 5:51:00 PM By: [name] MD Entered By: [name] on 01/09/2020 08:44:57 -------------------------------------------------------------------------------- SuperBill Details Patient Name: Date of Service: Daniel Pia D. 01/09/2020 Medical Record [id number] Patient Account Number: [id number] Date of Birth/Sex: Treating RN: [id number] (79 y.o. [name] Primary Care Provider: Jeralyn Ruths Other Clinician: Referring Provider: Treating Provider/Extender:Lashun Mccants, Donneta Romberg,  [facility] Weeks in Treatment: 45 Diagnosis Coding ICD-10 Codes Code Description L97.211 Non-pressure chronic ulcer of right calf limited to breakdown of skin L97.221 Non-pressure chronic ulcer of left calf limited to breakdown of skin I87.333 Chronic venous hypertension (idiopathic) with ulcer and inflammation of bilateral lower extremity E11.51 Type 2 diabetes mellitus with diabetic peripheral angiopathy without gangrene E11.42 Type 2 diabetes mellitus with diabetic polyneuropathy L97.524 Non-pressure chronic ulcer of other part of left foot with necrosis of bone L97.511 Non-pressure chronic ulcer of other part of right foot limited to breakdown of skin S85.46 Chronic systolic (congestive) heart failure E70.350 Other chronic osteomyelitis, left ankle and foot Facility Procedures The patient participates with Medicare or their insurance follows the Medicare Facility Guidelines: CPT4 Code Description Modifier Quantity [id number] - WOUND CARE VISIT-LEV 5 EST PT 1 Physician Procedures CPT4: Code [id number] Description: 213 - WC PHYS LEVEL 3 - EST PT ICD-10 Diagnosis Description L97.211 Non-pressure chronic ulcer of right calf limited to brea L97.221 Non-pressure chronic ulcer of left calf limited to break I87.333 Chronic venous hypertension (idiopathic)  with ulcer and lower extremity Modifier: kdown of skin down of skin inflammation of Quantity: 1 bilateral Electronic Signature(s) Signed: 01/09/2020 5:51:00 PM By: [name] MD Entered By: [name] on 01/09/2020 08:45:14

## 2020-01-10 NOTE — Progress Notes (Signed)
[name], [name] ([id number]) Visit Report for 01/09/2020 Arrival Information Details Patient Name: Date of Service: [name], [name] 01/09/2020 8:00 AM Medical Record Number:[id number] Patient Account Number: [id number] Date of Birth/Sex: Treating RN: April 03, 1941 (79 y.o. [name] Primary Care Danney Bungert: Jeralyn Ruths Other Clinician: Referring Jamal Pavon: Treating Sonny Poth/Extender:Robson, Donneta Romberg, [facility] Weeks in Treatment: 35 Visit Information History Since Last Visit Added or deleted any medications: No Patient Arrived: Wheel Chair Any new allergies or adverse reactions: No Arrival Time: 08:04 Had a fall or experienced change in No Accompanied By: wife activities of daily living that may affect Transfer Assistance: None risk of falls: Patient Identification Verified: Yes Signs or symptoms of abuse/neglect since last No Secondary Verification Process Yes visito Completed: Hospitalized since last visit: No Patient Requires Transmission- No Implantable device outside of the clinic excluding No Based Precautions: cellular tissue based products placed in the center Patient Has Alerts: Yes since last visit: Patient Alerts: Patient on Blood Has Dressing in Place as Prescribed: Yes Thinner Pain Present Now: No Electronic Signature(s) Signed: 01/10/2020 9:19:04 AM By: Sandre Kitty Entered By: Sandre Kitty on 01/09/2020 08:05:13 -------------------------------------------------------------------------------- Clinic Level of Care Assessment Details Patient Name: Date of Service: [name], [name] 01/09/2020 8:00 AM Medical Record [id number] Patient Account Number: [id number] Date of Birth/Sex: Treating RN: 1941/01/28 (79 y.o. [name] Primary Care Aima Mcwhirt: Jeralyn Ruths Other Clinician: Referring Towana Stenglein: Treating Alferd Obryant/Extender:Robson, Donneta Romberg, [facility] Weeks in Treatment: 45 Clinic Level of Care Assessment Items TOOL 4 Quantity Score X  - Use when only an EandM is performed on FOLLOW-UP visit 1 0 ASSESSMENTS - Nursing Assessment / Reassessment X - Reassessment of Co-morbidities (includes updates in patient status) 1 10 X - Reassessment of Adherence to Treatment Plan 1 5 ASSESSMENTS - Wound and Skin Assessment / Reassessment _0  - Simple Wound Assessment / Reassessment - one wound 0 X - Complex Wound Assessment / Reassessment - multiple wounds 3 5 X - Dermatologic / Skin Assessment (not related to wound area) 1 10 ASSESSMENTS - Focused Assessment X - Circumferential Edema Measurements - multi extremities 2 5 X - Nutritional Assessment / Counseling / Intervention 1 10 _1  - Lower Extremity Assessment (monofilament, tuning fork, pulses) 0 _2  - Peripheral Arterial Disease Assessment (using hand held doppler) 0 ASSESSMENTS - Ostomy and/or Continence Assessment and Care _3  - Incontinence Assessment and Management 0 _4  - Ostomy Care Assessment and Management (repouching, etc.) 0 PROCESS - Coordination of Care _5  - Simple Patient / Family Education for ongoing care 0 X - Complex (extensive) Patient / Family Education for ongoing care 1 20 X - Staff obtains Programmer, systems, Records, Test Results / Process Orders 1 10 _6  - Staff telephones HHA, Nursing Homes / Clarify orders / etc 0 _7  - Routine Transfer to another Facility (non-emergent condition) 0 _8  - Routine Hospital Admission (non-emergent condition) 0 _9  - New Admissions / Biomedical engineer / Ordering NPWT, Apligraf, etc. 0 _10  - Emergency Hospital Admission (emergent condition) 0 _11  - Simple Discharge Coordination 0 X - Complex (extensive) Discharge Coordination 1 15 PROCESS - Special Needs _12  - Pediatric / Minor Patient Management 0 _13  - Isolation Patient Management 0 _14  - Hearing / Language / Visual special needs 0 _15  - Assessment of Community assistance (transportation, D/C planning, etc.) 0 _16  - Additional assistance / Altered mentation 0 _17  - Support Surface(s)  Assessment (bed, cushion, seat, etc.) 0 INTERVENTIONS - Wound Cleansing / Measurement _18  - Simple Wound Cleansing - one wound 0 X - Complex Wound Cleansing -  multiple wounds 3 5 X - Wound Imaging (photographs - any number of wounds) 1 5 _0  - Wound Tracing (instead of photographs) 0 _1  - Simple Wound Measurement - one wound 0 X - Complex Wound Measurement - multiple wounds 3 5 INTERVENTIONS - Wound Dressings _2  - Small Wound Dressing one or multiple wounds 0 _3  - Medium Wound Dressing one or multiple wounds 0 X - Large Wound Dressing one or multiple wounds 2 20 <GLOVFIEPPIRJJOAC>_1<\/YSAYTKZSWFUXNATF>_5  - Application of Medications - topical 0 <DDUKGURKYHCWCBJS>_2<\/GBTDVVOHYWVPXTGG>_2  - Application of Medications - injection 0 INTERVENTIONS - Miscellaneous _6  - External ear exam 0 _7  - Specimen Collection (cultures, biopsies, blood, body fluids, etc.) 0 _8  - Specimen(s) / Culture(s) sent or taken to Lab for analysis 0 _9  - Patient Transfer (multiple staff / Civil Service fast streamer / Similar devices) 0 _10  - Simple Staple / Suture removal (25 or less) 0 _11  - Complex Staple / Suture removal (26 or more) 0 _12  - Hypo / Hyperglycemic Management (close monitor of Blood Glucose) 0 _13  - Ankle / Brachial Index (ABI) - do not check if billed separately 0 X - Vital Signs 1 5 Has the patient been seen at the hospital within the last three years: Yes Total Score: 185 Level Of Care: New/Established - Level 5 Electronic Signature(s) Signed: 01/09/2020 6:03:05 PM By: Deon Pilling Entered By: Deon Pilling on 01/09/2020 08:29:33 -------------------------------------------------------------------------------- Encounter Discharge Information Details Patient Name: Date of Service: Daniel Pia D. 01/09/2020 8:00 AM Medical Record [id number] Patient Account Number: [id number] Date of Birth/Sex: Treating RN: 01/15/1941 (79 y.o. [name] Primary Care Khylan Sawyer: Jeralyn Ruths Other Clinician: Referring Lamar Meter: Treating Mckay Brandt/Extender:Robson, Donneta Romberg, [facility] Weeks in  Treatment: 84 Encounter Discharge Information Items Discharge Condition: Stable Ambulatory Status: Wheelchair Discharge Destination: Home Transportation: Private Auto Accompanied By: wife Schedule Follow-up Appointment: Yes Clinical Summary of Care: Patient Declined Electronic Signature(s) Signed: 01/09/2020 5:52:37 PM By: Kela Millin Entered By: Kela Millin on 01/09/2020 08:55:18 -------------------------------------------------------------------------------- Lower Extremity Assessment Details Patient Name: Date of Service: JAVED, COTTO 01/09/2020 8:00 AM Medical Record [id number] Patient Account Number: [id number] Date of Birth/Sex: Treating RN: 1940/11/11 (79 y.o. [name] Primary Care Adarsh Mundorf: Jeralyn Ruths Other Clinician: Referring Meagon Duskin: Treating Benito Lemmerman/Extender:Robson, Donneta Romberg, [facility] Weeks in Treatment: 45 Edema Assessment Assessed: [Left: No] [Right: No] Edema: [Left: No] [Right: No] Calf Left: Right: Point of Measurement: 31 cm From Medial Instep 31.8 cm 31.6 cm Ankle Left: Right: Point of Measurement: 11 cm From Medial Instep 20.3 cm 20.6 cm Vascular Assessment Pulses: Dorsalis Pedis Palpable: [Left:Yes] [Right:Yes] Electronic Signature(s) Signed: 01/09/2020 5:52:37 PM By: Kela Millin Entered By: Kela Millin on 01/09/2020 08:14:31 -------------------------------------------------------------------------------- Multi Wound Chart Details Patient Name: Date of Service: Daniel Pia D. 01/09/2020 8:00 AM Medical Record [id number] Patient Account Number: [id number] Date of Birth/Sex: Treating RN: 1940/11/07 (79 y.o. [name] Primary Care Tyrece Vanterpool: Jeralyn Ruths Other Clinician: Referring Steph Cheadle: Treating Persephanie Laatsch/Extender:Robson, Donneta Romberg, [facility] Weeks in Treatment: 45 Vital Signs Height(in): 74 Pulse(bpm): 100 Weight(lbs): 212 Blood Pressure(mmHg): 108/69 Body Mass Index(BMI):  27 Temperature(F): 97.7 Respiratory 18 Rate(breaths/min): Photos: [19:No Photos] [25:No Photos] [3R:No Photos] Wound Location: [19:Left, Anterior Lower Leg] [25:Left, Distal, Posterior Lower Right, Posterior Calf Leg] Wounding Event: [19:Gradually Appeared] [25:Gradually Appeared] [3R:Gradually Appeared] Primary Etiology: [19:Venous Leg Ulcer] [25:Venous Leg Ulcer] [3R:Diabetic Wound/Ulcer of the Lower Extremity] Secondary Etiology: [19:N/A] [25:Diabetic Wound/Ulcer of the N/A Lower Extremity] Comorbid History: [19:Cataracts, Hypertension, Cataracts, Hypertension, Cataracts, Hypertension, Peripheral Venous Disease, Peripheral Venous Disease, Peripheral Venous Disease, Type II Diabetes, Gout, Received Radiation] [  25:Type II Diabetes, Gout,  Received Radiation] [3R:Type II Diabetes, Gout, Received Radiation] Date Acquired: [19:09/23/2019] [25:10/04/2019] [3R:02/28/2019] Weeks of Treatment: [19:15] [25:13] [3R:45] Wound Status: [19:Open] [25:Open] [3R:Open] Wound Recurrence: [19:No] [25:No] [3R:Yes] Clustered Wound: [19:No] [25:Yes] [3R:No] Clustered Quantity: [19:1] [25:1] [3R:N/A] Measurements L x W x D 0.4x0.3x0.1 [25:0.8x0.4x0.2] [3R:0.5x0.3x0.1] (cm) Area (cm) : [19:0.094] [25:0.251] [3R:0.118] Volume (cm) : [19:0.009] [25:0.05] [3R:0.012] % Reduction in Area: [19:99.80%] [25:99.00%] [3R:99.50%] % Reduction in Volume: 99.80% [25:98.00%] [3R:99.50%] Classification: [19:Full Thickness Without Exposed Support Structures Exposed Support Structures] [25:Full Thickness Without] [3R:Grade 2] Exudate Amount: [19:Small] [25:Small] [3R:Small] Exudate Type: [19:Serosanguineous] [25:Serosanguineous] [3R:Serosanguineous] Exudate Color: [19:red, brown] [25:red, brown] [3R:red, brown] Wound Margin: [19:Flat and Intact] [25:Flat and Intact] [3R:Flat and Intact] Granulation Amount: [19:Large (67-100%)] [25:Large (67-100%)] [3R:Large (67-100%)] Granulation Quality: [19:Red] [25:Pink] [3R:Red] Necrotic  Amount: [19:None Present (0%)] [25:Small (1-33%)] [3R:None Present (0%)] Exposed Structures: [19:Fat Layer (Subcutaneous Tissue) Exposed: Yes Fascia: No Tendon: No Muscle: No Joint: No Bone: No Medium (34-66%)] [25:Fat Layer (Subcutaneous Tissue) Exposed: Yes Fascia: No Tendon: No Muscle: No Joint: No Bone: No Small (1-33%)] [3R:Fat Layer  (Subcutaneous Tissue) Exposed: Yes Fascia: No Tendon: No Muscle: No Joint: No Bone: No Large (67-100%)] Treatment Notes Electronic Signature(s) Signed: 01/09/2020 5:51:00 PM By: [name] MD Signed: 01/09/2020 6:03:05 PM By: Deon Pilling Entered By: [name] on 01/09/2020 08:35:00 -------------------------------------------------------------------------------- Multi-Disciplinary Care Plan Details Patient Name: Date of Service: Daniel Pia D. 01/09/2020 8:00 AM Medical Record [id number] Patient Account Number: [id number] Date of Birth/Sex: Treating RN: January 10, 1941 (79 y.o. [name] Primary Care Torrin Frein: Jeralyn Ruths Other Clinician: Referring Khalis Hittle: Treating Maryclaire Stoecker/Extender:Robson, Donneta Romberg, [facility] Weeks in Treatment: 52 Active Inactive Wound/Skin Impairment Nursing Diagnoses: Knowledge deficit related to ulceration/compromised skin integrity Goals: Patient/caregiver will verbalize understanding of skin care regimen Date Initiated: 02/28/2019 Target Resolution Date: 01/30/2020 Goal Status: Active Ulcer/skin breakdown will have a volume reduction of 30% by week 4 Date Initiated: 02/28/2019 Date Inactivated: 04/04/2019 Target Resolution Date: 03/31/2019 Goal Status: Met Ulcer/skin breakdown will have a volume reduction of 50% by week 8 Date Initiated: 04/04/2019 Date Inactivated: 05/09/2019 Target Resolution Date: 05/05/2019 Goal Status: Met Ulcer/skin breakdown will have a volume reduction of 80% by week 12 Date Initiated: 05/09/2019 Date Inactivated: 06/13/2019 Target Resolution Date: 06/09/2019 Unmet Goal Status:  Unmet Reason: comorbities/new wounds Ulcer/skin breakdown will heal within 14 weeks Date Initiated: 06/13/2019 Date Inactivated: 07/11/2019 Target Resolution Date: 07/07/2019 Unmet Goal Status: Unmet Reason: comorbityies Interventions: Assess patient/caregiver ability to obtain necessary supplies Assess patient/caregiver ability to perform ulcer/skin care regimen upon admission and as needed Assess ulceration(s) every visit Notes: Electronic Signature(s) Signed: 01/09/2020 6:03:05 PM By: Deon Pilling Entered By: Deon Pilling on 01/09/2020 07:56:44 -------------------------------------------------------------------------------- Pain Assessment Details Patient Name: Date of Service: [name], [name] 01/09/2020 8:00 AM Medical Record [id number] Patient Account Number: [id number] Date of Birth/Sex: Treating RN: 23-Mar-1941 (79 y.o. [name] Primary Care Marvis Bakken: Jeralyn Ruths Other Clinician: Referring Kiana Hollar: Treating Oriel Rumbold/Extender:Robson, Donneta Romberg, [facility] Weeks in Treatment: 85 Active Problems Location of Pain Severity and Description of Pain Patient Has Paino No Site Locations Pain Management and Medication Current Pain Management: Electronic Signature(s) Signed: 01/09/2020 6:03:05 PM By: Deon Pilling Signed: 01/10/2020 9:19:04 AM By: Sandre Kitty Entered By: Sandre Kitty on 01/09/2020 08:07:42 -------------------------------------------------------------------------------- Patient/Caregiver Education Details Patient Name: Date of Service: [name], Daniel D. 4/13/2021andnbsp8:00 AM Medical Record [id number] Patient Account Number: [id number] Date of Birth/Gender: Treating RN: 11/05/1940 (79 y.o. [name] Primary Care Physician: Jeralyn Ruths Other Clinician: Referring Physician: Treating Physician/Extender:Robson,  Donneta Romberg, [place] Weeks in Treatment: 60 Education Assessment Education Provided To: Patient and  Caregiver Education Topics Provided Wound/Skin Impairment: Handouts: Caring for Your Ulcer Methods: Explain/Verbal Responses: Reinforcements needed Electronic Signature(s) Signed: 01/09/2020 6:03:05 PM By: Deon Pilling Entered By: Deon Pilling on 01/09/2020 07:56:59 -------------------------------------------------------------------------------- Wound Assessment Details Patient Name: Date of Service: [name], [name] 01/09/2020 8:00 AM Medical Record [id number] Patient Account Number: [id number] Date of Birth/Sex: Treating RN: [birth year] (79 y.o. [name] Primary Care Avedis Bevis: Jeralyn Ruths Other Clinician: Referring Lailoni Baquera: Treating Kaidan Spengler/Extender:Robson, Donneta Romberg, [facility] Weeks in Treatment: 45 Wound Status Wound Number: 19 Primary Venous Leg Ulcer Etiology: Wound Location: Left, Anterior Lower Leg Wound Open Wounding Event: Gradually Appeared Status: Date Acquired: 09/23/2019 Comorbid Cataracts, Hypertension, Peripheral Venous Weeks Of Treatment: 15 History: Disease, Type II Diabetes, Gout, Received Clustered Wound: No Radiation Wound Measurements Length: (cm) 0.4 % Reduct Width: (cm) 0.3 % Reduct Depth: (cm) 0.1 Epitheli Clustered Quantity: 1 Tunnelin Area: (cm) 0.094 Undermi Volume: (cm) 0.009 Wound Description Classification: Full Thickness Without Exposed Support Foul Odo Structures Slough/F Wound Flat and Intact Margin: Exudate Small Amount: Exudate Serosanguineous Type: Exudate red, brown Color: Wound Bed Granulation Amount: Large (67-100%) Granulation Quality: Red Fascia E Necrotic Amount: None Present (0%) Fat Laye Tendon E Muscle E Joint Ex Bone Exp r After Cleansing: No ibrino No Exposed Structure xposed: No r (Subcutaneous Tissue) Exposed: Yes xposed: No xposed: No posed: No osed: No ion in Area: 99.8% ion in Volume: 99.8% alization: Medium (34-66%) g: No ning: No Treatment Notes Wound #19 (Left, Anterior  Lower Leg) 1. Cleanse With Wound Cleanser Soap and water 2. Periwound Care TCA Cream 3. Primary Dressing Applied Hydrofera Blue 4. Secondary Dressing Dry Gauze 6. Support Layer Applied Kerlix/Coban Notes Horticulturist, commercial) Signed: 01/09/2020 5:52:37 PM By: Kela Millin Signed: 01/09/2020 6:03:05 PM By: Deon Pilling Entered By: Kela Millin on 01/09/2020 08:14:46 -------------------------------------------------------------------------------- Wound Assessment Details Patient Name: Date of Service: Daniel Pia D. 01/09/2020 8:00 AM Medical Record [id number] Patient Account Number: [id number] Date of Birth/Sex: Treating RN: 03-May-1941 (79 y.o. [name] Primary Care Deantre Bourdon: Jeralyn Ruths Other Clinician: Referring Whyatt Klinger: Treating Cormac Wint/Extender:Robson, Donneta Romberg, [facility] Weeks in Treatment: 45 Wound Status Wound Number: 25 Primary Venous Leg Ulcer Etiology: Wound Location: Left, Distal, Posterior Lower Leg Secondary Diabetic Wound/Ulcer of the Lower Extremity Wounding Event: Gradually Appeared Etiology: Date Acquired: 10/04/2019 Wound Open Weeks Of Treatment: 13 Status: Clustered Wound: Yes Comorbid Cataracts, Hypertension, Peripheral Venous History: Disease, Type II Diabetes, Gout, Received Radiation Wound Measurements Length: (cm) 0.8 % Reduct Width: (cm) 0.4 % Reduct Depth: (cm) 0.2 Epitheli Clustered Quantity: 1 Tunnelin Area: (cm) 0.251 Undermi Volume: (cm) 0.05 Wound Description Classification: Full Thickness Without Exposed Support Foul Odo Structures Slough/F Wound Flat and Intact Margin: Exudate Small Amount: Exudate Serosanguineous Type: Exudate red, brown Color: Wound Bed Granulation Amount: Large (67-100%) Granulation Quality: Pink Fascia E Necrotic Amount: Small (1-33%) Fat Laye Necrotic Quality: Adherent Slough Tendon E Muscle E Joint Ex Bone Exp r After Cleansing: No ibrino Yes Exposed  Structure xposed: No r (Subcutaneous Tissue) Exposed: Yes xposed: No xposed: No posed: No osed: No ion in Area: 99% ion in Volume: 98% alization: Small (1-33%) g: No ning: No Treatment Notes Wound #25 (Left, Distal, Posterior Lower Leg) 1. Cleanse With Wound Cleanser Soap and water 2. Periwound Care TCA Cream 3. Primary Dressing Applied Hydrofera Blue 4. Secondary Dressing Dry Gauze 6. Support Layer Applied Kerlix/Coban Notes Horticulturist, commercial) Signed: 01/09/2020 5:52:37 PM By:  Kela Millin Signed: 01/09/2020 6:03:05 PM By: Deon Pilling Entered By: Kela Millin on 01/09/2020 08:15:00 -------------------------------------------------------------------------------- Wound Assessment Details Patient Name: Date of Service: [name], [name] 01/09/2020 8:00 AM Medical Record [id number] Patient Account Number: [id number] Date of Birth/Sex: Treating RN: [birth year] (79 y.o. [name] Primary Care Chaston Bradburn: Jeralyn Ruths Other Clinician: Referring Karinna Beadles: Treating Wheeler Incorvaia/Extender:Robson, Donneta Romberg, [facility] Weeks in Treatment: 45 Wound Status Wound Number: [id number] Primary Diabetic Wound/Ulcer of the Lower Extremity Etiology: Wound Location: Right, Posterior Calf Wound Open Wounding Event: Gradually Appeared Status: Date Acquired: 02/28/2019 Comorbid Cataracts, Hypertension, Peripheral Venous Weeks Of Treatment: 45 History: Disease, Type II Diabetes, Gout, Received Clustered Wound: No Radiation Wound Measurements Length: (cm) 0.5 % Reduction Width: (cm) 0.3 % Reduction Depth: (cm) 0.1 Epitheliali Area: (cm) 0.118 Tunneling: Volume: (cm) 0.012 Underminin Wound Description Classification: Grade 2 Wound Margin: Flat and Intact Exudate Amount: Small Exudate Type: Serosanguineous Exudate Color: red, brown Wound Bed Granulation Amount: Large (67-100%) Granulation Quality: Red Necrotic Amount: None Present (0%) Foul Odor After  Cleansing: No Slough/Fibrino No Exposed Structure Fascia Exposed: No Fat Layer (Subcutaneous Tissue) Exposed: Yes Tendon Exposed: No Muscle Exposed: No Joint Exposed: No Bone Exposed: No in Area: 99.5% in Volume: 99.5% zation: Large (67-100%) No g: No Treatment Notes Wound #3R (Right, Posterior Calf) 1. Cleanse With Wound Cleanser Soap and water 2. Periwound Care TCA Cream 3. Primary Dressing Applied Hydrofera Blue 4. Secondary Dressing Dry Gauze 6. Support Layer Applied Kerlix/Coban Notes Horticulturist, commercial) Signed: 01/09/2020 5:52:37 PM By: Kela Millin Signed: 01/09/2020 6:03:05 PM By: Deon Pilling Entered By: Kela Millin on 01/09/2020 08:15:15 -------------------------------------------------------------------------------- Vitals Details Patient Name: Date of Service: Daniel Pia D. 01/09/2020 8:00 AM Medical Record [id number] Patient Account Number: [id number] Date of Birth/Sex: Treating RN: 05/24/1941 (79 y.o. [name] Primary Care Gentry Seeber: Jeralyn Ruths Other Clinician: Referring Alakai Macbride: Treating Chesney Suares/Extender:Robson, Donneta Romberg, [facility] Weeks in Treatment: 45 Vital Signs Time Taken: 08:07 Temperature (F): 97.7 Height (in): 74 Pulse (bpm): 100 Weight (lbs): 212 Respiratory Rate (breaths/min): 18 Body Mass Index (BMI): 27.2 Blood Pressure (mmHg): 108/69 Reference Range: 80 - 120 mg / dl Electronic Signature(s) Signed: 01/10/2020 9:19:04 AM By: Sandre Kitty Entered By: Sandre Kitty on 01/09/2020 08:07:32

## 2020-01-10 NOTE — Progress Notes (Signed)
[name], [name] ([id number]) Visit Report for 08/29/2019 Arrival Information Details Patient Name: Date of Service: [name], [name] 08/29/2019 9:15 AM Medical Record [place] Patient Account Number: [id number] Date of Birth/Sex: Treating RN: [birth year] (79 y.o. M) Primary Care Latessa Tillis: Jeralyn Ruths Other Clinician: Referring Ociel Retherford: Treating Novaleigh Kohlman/Extender:Robson, Donneta Romberg, [facility] Weeks in Treatment: 26 Visit Information History Since Last Visit Added or deleted any medications: No Patient Arrived: Ambulatory Any new allergies or adverse reactions: No Arrival Time: 09:33 Had a fall or experienced change in No Accompanied By: wife activities of daily living that may affect Transfer Assistance: None risk of falls: Patient Identification Verified: Yes Signs or symptoms of abuse/neglect since last No Secondary Verification Process Yes visito Completed: Hospitalized since last visit: No Patient Requires Transmission- No Implantable device outside of the clinic excluding No Based Precautions: cellular tissue based products placed in the center Patient Has Alerts: Yes since last visit: Patient Alerts: Patient on Blood Has Dressing in Place as Prescribed: Yes Thinner Pain Present Now: No Electronic Signature(s) Signed: 01/10/2020 9:25:05 AM By: Sandre Kitty Entered By: Sandre Kitty on 08/29/2019 09:33:40 -------------------------------------------------------------------------------- Compression Therapy Details Patient Name: Date of Service: [name], [name] 08/29/2019 9:15 AM Medical Record [id number] Patient Account Number: [id number] Date of Birth/Sex: Treating RN: 1941/08/16 (78 y.o. [name]) [name] Primary Care Lavanna Rog: Jeralyn Ruths Other Clinician: Referring Rafiel Mecca: Treating Verlia Kaney/Extender:Robson, Donneta Romberg, [facility] Weeks in Treatment: 26 Compression Therapy Performed for Wound Wound #12 Left,Lateral Lower  Leg Assessment: Performed By: Jake Church, RN Compression Type: Three Layer Post Procedure Diagnosis Same as Pre-procedure Electronic Signature(s) Signed: 09/05/2019 2:56:39 PM By: [name] RN Entered By: [name] on 08/29/2019 10:57:19 -------------------------------------------------------------------------------- Compression Therapy Details Patient Name: Date of Service: [name], [name] 08/29/2019 9:15 AM Medical Record [id number] Patient Account Number: [id number] Date of Birth/Sex: Treating RN: 1941/02/16 (78 y.o. [name]) [name] Primary Care Jovann Luse: Jeralyn Ruths Other Clinician: Referring Pooja Camuso: Treating Rogena Deupree/Extender:Robson, Donneta Romberg, [facility] Weeks in Treatment: 26 Compression Therapy Performed for Wound Wound #15 Left,Anterior Lower Leg Assessment: Performed By: Jake Church, RN Compression Type: Three Layer Post Procedure Diagnosis Same as Pre-procedure Electronic Signature(s) Signed: 09/05/2019 2:56:39 PM By: [name] RN Entered By: [name] on 08/29/2019 10:57:19 -------------------------------------------------------------------------------- Compression Therapy Details Patient Name: Date of Service: [name], [name] 08/29/2019 9:15 AM Medical Record [id number] Patient Account Number: [id number] Date of Birth/Sex: Treating RN: 1941/06/10 (78 y.o. [name]) [name] Primary Care Kadeshia Kasparian: Jeralyn Ruths Other Clinician: Referring Jaekwon Mcclune: Treating Isaiah Cianci/Extender:Robson, Donneta Romberg, [facility] Weeks in Treatment: 26 Compression Therapy Performed for Wound Wound #3R Right,Posterior Calf Assessment: Performed By: Jake Church, RN Compression Type: Three Layer Post Procedure Diagnosis Same as Pre-procedure Electronic Signature(s) Signed: 09/05/2019 2:56:39 PM By: [name] RN Entered By: [name] on 08/29/2019  10:57:19 -------------------------------------------------------------------------------- Encounter Discharge Information Details Patient Name: Date of Service: Daniel Pia D. 08/29/2019 9:15 AM Medical Record [id number] Patient Account Number: [id number] Date of Birth/Sex: Treating RN: 09-28-1941 (79 y.o. [name] Primary Care Nahla Lukin: Jeralyn Ruths Other Clinician: Referring Andree Golphin: Treating Ryhanna Dunsmore/Extender:Robson, Donneta Romberg, [facility] Weeks in Treatment: 26 Encounter Discharge Information Items Post Procedure Vitals Discharge Condition: Stable Temperature (F): 97.6 Ambulatory Status: Ambulatory Pulse (bpm): 66 Discharge Destination: Home Respiratory Rate (breaths/min): 22 Transportation: Private Auto Blood Pressure (mmHg): 149/79 Accompanied By: spouse Schedule Follow-up Appointment: Yes Clinical Summary of Care: Patient Declined Electronic Signature(s) Signed: 08/30/2019 6:22:02 PM By: Baruch Gouty RN, BSN Entered By: Baruch Gouty on 08/29/2019 11:24:05 -------------------------------------------------------------------------------- Lower Extremity Assessment Details Patient Name: Date  of Service: [name], [name] 08/29/2019 9:15 AM Medical Record [place] Patient Account Number: [id number] Date of Birth/Sex: Treating RN: 1941-03-25 (78 y.o. [name]) [name] Primary Care Chadric Kimberley: Jeralyn Ruths Other Clinician: Referring Abel Hageman: Treating Duan Scharnhorst/Extender:Robson, Donneta Romberg, [facility] Weeks in Treatment: 26 Edema Assessment Assessed: [Left: No] [Right: No] Edema: [Left: No] [Right: No] Calf Left: Right: Point of Measurement: 31 cm From Medial Instep 36 cm 30 cm Ankle Left: Right: Point of Measurement: 11 cm From Medial Instep 120 cm 20.5 cm Electronic Signature(s) Signed: 09/05/2019 2:56:39 PM By: [name] RN Entered By: [name] on 08/29/2019  10:02:08 -------------------------------------------------------------------------------- Multi Wound Chart Details Patient Name: Date of Service: Daniel Pia D. 08/29/2019 9:15 AM Medical Record [id number] Patient Account Number: [id number] Date of Birth/Sex: Treating RN: 1941-01-08 (79 y.o. M) Primary Care Harla Mensch: Jeralyn Ruths Other Clinician: Referring Zebulon Gantt: Treating Cambreigh Dearing/Extender:Robson, Donneta Romberg, [name] Weeks in Treatment: 26 Vital Signs Height(in): 74 Pulse(bpm): 63 Weight(lbs): 212 Blood Pressure(mmHg): 149/79 Body Mass Index(BMI): 27 Temperature(F): 97.6 Respiratory 18 Rate(breaths/min): Photos: [12:No Photos] [13:No Photos] [14:No Photos] Wound Location: [12:Left Lower Leg - Lateral] [13:Left Toe Second] [14:Left Toe Third] Wounding Event: [12:Blister] [13:Gradually Appeared] [14:Gradually Appeared] Primary Etiology: [12:Venous Leg Ulcer] [13:Diabetic Wound/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] [14:Lower Extremity] Comorbid History: [12:Cataracts, Hypertension, Cataracts, Hypertension, Cataracts, Hypertension, Peripheral Venous Disease, Peripheral Venous Disease, Peripheral Venous Disease, Type II Diabetes, Gout, Received Radiation] [13:Type II Diabetes, Gout,  Received Radiation] [14:Type II Diabetes, Gout, Received Radiation] Date Acquired: [12:08/06/2019] [13:08/22/2019] [14:08/22/2019] Weeks of Treatment: [12:3] [13:1] [14:1] Wound Status: [12:Open] [13:Open] [14:Open] Wound Recurrence: [12:No] [13:No] [14:No] Measurements L x W x D 0.2x0.2x0.1 [13:1.7x2.5x0.1] [14:0.5x0.5x0.1] (cm) Area (cm) : [12:0.031] [13:3.338] [14:0.196] Volume (cm) : [12:0.003] [13:0.334] [14:0.02] % Reduction in Area: [12:95.60%] [13:-8.40%] [14:86.10%] % Reduction in Volume: 95.80% [13:-8.40%] [14:85.80%] Classification: [12:Full Thickness Without Exposed Support Structures] [13:Grade 1] [14:Grade 1] Exudate Amount: [12:Medium] [13:Medium]  [14:Medium] Exudate Type: [12:N/A] [13:Serosanguineous] [14:Serosanguineous] Exudate Color: [12:N/A] [13:red, brown] [14:red, brown] Wound Margin: [12:N/A] [13:Distinct, outline attached Distinct, outline attached] Granulation Amount: [12:Large (67-100%)] [13:Large (67-100%)] [14:Large (67-100%)] Granulation Quality: [12:Red, Pink, Pale] [13:Red, Pink] [14:Red, Pink] Necrotic Amount: [12:N/A] [13:None Present (0%)] [14:None Present (0%)] Exposed Structures: [12:Fat Layer (Subcutaneous Tissue) Exposed: Yes Fascia: No Tendon: No Muscle: No Joint: No Bone: No] [13:Fascia: No Fat Layer (Subcutaneous Tissue) Exposed: No Tendon: No Muscle: No Joint: No Bone: No] [14:Fascia: No Fat Layer (Subcutaneous Tissue)  Exposed: No Tendon: No Muscle: No Joint: No Bone: No] Epithelialization: [12:N/A] [13:Small (1-33%)] [14:Small (1-33%)] Debridement: [12:N/A] [13:N/A] [14:N/A] Pain Control: [12:N/A] [13:N/A] [14:N/A] Tissue Debrided: [12:N/A] [13:N/A] [14:N/A] Level: [12:N/A] [13:N/A] [14:N/A] Debridement Area (sq cm):N/A [13:N/A] [14:N/A] Instrument: [12:N/A] [13:N/A] [14:N/A] Bleeding: [12:N/A] [13:N/A] [14:N/A] Hemostasis Achieved: [12:N/A] [13:N/A] [14:N/A] Procedural Pain: [12:N/A] [13:N/A] [14:N/A] Post Procedural Pain: [12:N/A] [13:N/A] [14:N/A] Debridement Treatment N/A [13:N/A] [14:N/A] Response: Post Debridement [12:N/A] [13:N/A] [14:N/A] Measurements L x W x D (cm) Post Debridement [12:N/A] [13:N/A] [14:N/A] Volume: (cm) Procedures Performed: N/A [12:15] [13:N/A 3R] [14:N/A N/A] Photos: [12:No Photos] [13:No Photos] [14:N/A] Wound Location: [12:Left Lower Leg - Anterior] [13:Right Calf - Posterior] [14:N/A] Wounding Event: [12:Blister] [13:Gradually Appeared] [14:N/A] Primary Etiology: [12:Diabetic Wound/Ulcer of the Diabetic Wound/Ulcer of the N/A Lower Extremity] [13:Lower Extremity] Comorbid History: [12:Cataracts, Hypertension, Cataracts, Hypertension, N/A Peripheral Venous Disease,  Peripheral Venous Disease, Type II Diabetes, Gout, Received Radiation] [13:Type II Diabetes, Gout, Received Radiation] Date Acquired: [12:08/29/2019] [13:02/28/2019] [14:N/A] Weeks of Treatment: [12:0] [13:26] [14:N/A] Wound Status: [12:Open] [13:Open] [14:N/A] Wound Recurrence: [12:No] [13:Yes] [14:N/A] Measurements  L x W x D 3x3.5x0.1 [13:1.5x1.3x0.1] [14:N/A] (cm) Area (cm) : [12:8.247] [13:1.532] [14:N/A] Volume (cm) : [12:0.825] [13:0.153] [14:N/A] % Reduction in Area: [12:0.00%] [13:93.80%] [14:N/A] % Reduction in Volume: 0.00% [13:93.80%] [14:N/A] Classification: [12:Grade 2] [13:Grade 2] [14:N/A] Exudate Amount: [12:Medium] [13:Medium] [14:N/A] Exudate Type: [12:Serosanguineous] [13:Serosanguineous] [14:N/A] Exudate Color: [12:red, brown] [13:red, brown] [14:N/A] Wound Margin: [12:N/A] [13:N/A] [14:N/A] Granulation Amount: [12:Large (67-100%)] [13:Large (67-100%)] [14:N/A] Granulation Quality: [12:Red] [13:Red, Pink, Pale] [14:N/A] Necrotic Amount: [12:None Present (0%)] [13:N/A] [14:N/A] Exposed Structures: [12:Fat Layer (Subcutaneous Fat Layer (Subcutaneous N/A Tissue) Exposed: Yes Fascia: No Tendon: No Muscle: No Joint: No Bone: No] [13:Tissue) Exposed: Yes Fascia: No Tendon: No Muscle: No Joint: No Bone: No] Epithelialization: [12:N/A] [13:None] [14:N/A] Debridement: [12:N/A] [13:Debridement - Excisional] [14:N/A] Pre-procedure [12:N/A] [13:10:43] [14:N/A] Verification/Time Out Taken: Pain Control: [12:N/A] [13:Lidocaine 5% topical ointment] [14:N/A] Tissue Debrided: [12:N/A] [13:Subcutaneous, Slough] [14:N/A] Level: [12:N/A] [13:Skin/Subcutaneous Tissue] [14:N/A] Debridement Area (sq cm):N/A [13:1.95] [14:N/A] Instrument: [12:N/A] [13:Curette] [14:N/A] Bleeding: [12:N/A] [13:Moderate] [14:N/A] Hemostasis Achieved: [12:N/A] [13:Pressure] [14:N/A] Procedural Pain: [12:N/A] [13:0] [14:N/A] Post Procedural Pain: [12:N/A] [13:0] [14:N/A] Debridement Treatment N/A  [13:Procedure was tolerated] [14:N/A] Response: [13:well] Post Debridement [12:N/A] [13:1.5x1.3x0.1] [14:N/A] Measurements L x W x D (cm) Post Debridement [12:N/A] [13:0.153] [14:N/A] Volume: (cm) Procedures Performed: N/A [13:Debridement] [14:N/A] Treatment Notes Electronic Signature(s) Signed: 08/29/2019 6:45:39 PM By: [name] MD Entered By: [name] on 08/29/2019 10:53:28 -------------------------------------------------------------------------------- Multi-Disciplinary Care Plan Details Patient Name: Date of Service: Daniel Pia D. 08/29/2019 9:15 AM Medical Record [id number] Patient Account Number: [id number] Date of Birth/Sex: Treating RN: [birth year] (78 y.o. [name] Primary Care Stephanny Tsutsui: Jeralyn Ruths Other Clinician: Referring Bertis Hustead: Treating Bartley Vuolo/Extender:Robson, Donneta Romberg, [name] Weeks in Treatment: 26 Active Inactive Wound/Skin Impairment Nursing Diagnoses: Knowledge deficit related to ulceration/compromised skin integrity Goals: Patient/caregiver will verbalize understanding of skin care regimen Date Initiated: 02/28/2019 Target Resolution Date: 09/01/2019 Goal Status: Active Ulcer/skin breakdown will have a volume reduction of 30% by week 4 Date Initiated: 02/28/2019 Date Inactivated: 04/04/2019 Target Resolution Date: 03/31/2019 Goal Status: Met Ulcer/skin breakdown will have a volume reduction of 50% by week 8 Date Initiated: 04/04/2019 Date Inactivated: 05/09/2019 Target Resolution Date: 05/05/2019 Goal Status: Met Ulcer/skin breakdown will have a volume reduction of 80% by week 12 Date Initiated: 05/09/2019 Date Inactivated: 06/13/2019 Target Resolution Date: 06/09/2019 Unmet Goal Status: Unmet Reason: comorbities/new wounds Ulcer/skin breakdown will heal within 14 weeks Date Initiated: 06/13/2019 Date Inactivated: 07/11/2019 Target Resolution Date: 07/07/2019 Unmet Goal Status: Unmet Reason:  comorbityies Interventions: Assess patient/caregiver ability to obtain necessary supplies Assess patient/caregiver ability to perform ulcer/skin care regimen upon admission and as needed Assess ulceration(s) every visit Notes: Electronic Signature(s) Signed: 09/05/2019 2:56:39 PM By: [name] RN Entered By: [name] on 08/29/2019 09:56:59 -------------------------------------------------------------------------------- Pain Assessment Details Patient Name: Date of Service: WOLF, BOULAY 08/29/2019 9:15 AM Medical Record [id number] Patient Account Number: [id number] Date of Birth/Sex: Treating RN: April 15, 1941 (79 y.o. M) Primary Care Harve Spradley: Jeralyn Ruths Other Clinician: Referring Minnette Merida: Treating Ja Ohman/Extender:Robson, Donneta Romberg, [facility] Weeks in Treatment: 26 Active Problems Location of Pain Severity and Description of Pain Patient Has Paino No Site Locations Pain Management and Medication Current Pain Management: Electronic Signature(s) Signed: 01/10/2020 9:25:05 AM By: Sandre Kitty Entered By: Sandre Kitty on 08/29/2019 09:34:03 -------------------------------------------------------------------------------- Patient/Caregiver Education Details Patient Name: Date of Service: [name] Gang 12/1/2020andnbsp9:15 AM Medical Record [id number] Patient Account Number: [id number] Date of Birth/Gender: Treating RN: 03-05-1941 (78 y.o. [name]) [name] Primary Care Physician: Jeralyn Ruths Other Clinician: Referring Physician: Treating Physician/Extender:Robson, Legrand Como  Jeralyn Ruths Weeks in Treatment: 26 Education Assessment Education Provided To: Patient Education Topics Provided Wound/Skin Impairment: Methods: Explain/Verbal Responses: State content correctly [employer]) Signed: 09/05/2019 2:56:39 PM By: [name] RN Entered By: [name] on 08/29/2019  09:57:12 -------------------------------------------------------------------------------- Wound Assessment Details Patient Name: Date of Service: [name], [name] 08/29/2019 9:15 AM Medical Record [id number] Patient Account Number: [id number] Date of Birth/Sex: Treating RN: 07-14-1941 (79 y.o. M) Primary Care Ernan Runkles: Jeralyn Ruths Other Clinician: Referring Chayla Shands: Treating Destine Ambroise/Extender:Robson, Donneta Romberg, [facility] Weeks in Treatment: 26 Wound Status Wound Number: 12 Primary Venous Leg Ulcer Etiology: Wound Location: Left Lower Leg - Lateral Wound Open Wounding Event: Blister Status: Date Acquired: 08/06/2019 Comorbid Cataracts, Hypertension, Peripheral Venous Weeks Of Treatment: 3 History: Disease, Type II Diabetes, Gout, Received Clustered Wound: No Radiation Photos Wound Measurements Length: (cm) 0.2 % Reduct Width: (cm) 0.2 % Reduct Depth: (cm) 0.1 Tunnelin Area: (cm) 0.031 Undermi Volume: (cm) 0.003 Wound Description Classification: Full Thickness Without Exposed Support Foul Odo Structures Slough/F Exudate Medium Amount: Wound Bed Granulation Amount: Large (67-100%) Granulation Quality: Red, Pink, Pale Fascia E Fat Laye Tendon E Muscle E Joint Ex Bone Exp r After Cleansing: No ibrino No Exposed Structure xposed: No r (Subcutaneous Tissue) Exposed: Yes xposed: No xposed: No posed: No osed: No ion in Area: 95.6% ion in Volume: 95.8% g: No ning: No Electronic Signature(s) Signed: 08/30/2019 4:25:17 PM By: Mikeal Hawthorne EMT/HBOT Entered By: Mikeal Hawthorne on 08/30/2019 11:39:32 -------------------------------------------------------------------------------- Wound Assessment Details Patient Name: Date of Service: [name], [name] 08/29/2019 9:15 AM Medical Record [id number] Patient Account Number: [id number] Date of Birth/Sex: Treating RN: 11/30/1940 (79 y.o. M) Primary Care Jerilynn Feldmeier: Jeralyn Ruths Other Clinician: Referring  Swathi Dauphin: Treating Sophya Vanblarcom/Extender:Robson, Donneta Romberg, [name] Weeks in Treatment: 26 Wound Status Wound Number: 13 Primary Diabetic Wound/Ulcer of the Lower Extremity Etiology: Wound Location: Left Toe Second Wound Open Wounding Event: Gradually Appeared Status: Date Acquired: 08/22/2019 Comorbid Cataracts, Hypertension, Peripheral Venous Weeks Of Treatment: 1 History: Disease, Type II Diabetes, Gout, Received Clustered Wound: No Radiation Photos Wound Measurements Length: (cm) 1.7 % Reduction i Width: (cm) 2.5 % Reduction i Depth: (cm) 0.1 Epithelializa Area: (cm) 3.338 Tunneling: Volume: (cm) 0.334 Undermining: Wound Description Classification: Grade 1 Foul Odor Aft Wound Margin: Distinct, outline attached Slough/Fibrin Exudate Amount: Medium Exudate Type: Serosanguineous Exudate Color: red, brown Wound Bed Granulation Amount: Large (67-100%) Granulation Quality: Red, Pink Fascia Expose Necrotic Amount: None Present (0%) Fat Layer (Su Tendon Expose Muscle Expose Joint Exposed Bone Exposed: Electronic Signature(s) Signed: 08/30/2019 4:25:17 PM By: Mikeal Hawthorne EMT/HBOT Entered By: Mikeal Hawthorne on 12/02 er Cleansing: No o No Exposed Structure d: No bcutaneous Tissue) Exposed: No d: No d: No : No No /2020 11:39:51 n Area: -8.4% n Volume: -8.4% tion: Small (1-33%) No No -------------------------------------------------------------------------------- Wound Assessment Details Patient Name: Date of Service: [name], [name] 08/29/2019 9:15 AM Medical Record [id number] Patient Account Number: [id number] Date of Birth/Sex: Treating RN: 08-03-1941 (79 y.o. M) Primary Care Yashvi Jasinski: Jeralyn Ruths Other Clinician: Referring Charrie Mcconnon: Treating Cashus Halterman/Extender:Robson, Donneta Romberg, [facility] Weeks in Treatment: 26 Wound Status Wound Number: 14 Primary Diabetic Wound/Ulcer of the Lower Extremity Etiology: Wound Location: Left Toe Third Wound  Open Wounding Event: Gradually Appeared Status: Date Acquired: 08/22/2019 Comorbid Cataracts, Hypertension, Peripheral Venous Weeks Of Treatment: 1 History: Disease, Type II Diabetes, Gout, Received Clustered Wound: No Radiation Photos Wound Measurements Length: (cm) 0.5 % Reductio Width: (cm) 0.5 % Reductio Depth: (cm) 0.1 Epithelial Area: (cm) 0.196 Tunneling Volume: (cm) 0.02 Undermini Wound  Description Classification: Grade 1 Foul Odor Wound Margin: Distinct, outline attached Slough/Fi Exudate Amount: Medium Exudate Type: Serosanguineous Exudate Color: red, brown Wound Bed Granulation Amount: Large (67-100%) Granulation Quality: Red, Pink Fascia Exp Necrotic Amount: None Present (0%) Fat Layer Tendon Exp Muscle Exp Joint Expo Bone Expos After Cleansing: No brino No Exposed Structure osed: No (Subcutaneous Tissue) Exposed: No osed: No osed: No sed: No ed: No n in Area: 86.1% n in Volume: 85.8% ization: Small (1-33%) : No ng: No Electronic Signature(s) Signed: 08/30/2019 4:25:17 PM By: Mikeal Hawthorne EMT/HBOT Entered By: Mikeal Hawthorne on 08/30/2019 11:41:41 -------------------------------------------------------------------------------- Wound Assessment Details Patient Name: Date of Service: [name], [name] 08/29/2019 9:15 AM Medical Record [id number] Patient Account Number: [id number] Date of Birth/Sex: Treating RN: [birth year] (79 y.o. M) Primary Care Hoyt Leanos: Jeralyn Ruths Other Clinician: Referring Witt Plitt: Treating Aadyn Buchheit/Extender:Robson, Donneta Romberg, [name] Weeks in Treatment: 26 Wound Status Wound Number: 15 Primary Diabetic Wound/Ulcer of the Lower Extremity Etiology: Wound Location: Left Lower Leg - Anterior Wound Open Wounding Event: Blister Status: Date Acquired: 08/29/2019 Comorbid Cataracts, Hypertension, Peripheral Venous Weeks Of Treatment: 0 History: Disease, Type II Diabetes, Gout, Received Clustered Wound:  No Radiation Photos Wound Measurements Length: (cm) 3 Width: (cm) 3.5 Depth: (cm) 0.1 Area: (cm) 8.247 Volume: (cm) 0.825 Wound Description Classification: Grade 2 Exudate Amount: Medium Exudate Type: Serosanguineous Exudate Color: red, brown Wound Bed Granulation Amount: Large (67-100%) Granulation Quality: Red Necrotic Amount: None Present (0%) After Cleansing: No brino No Exposed Structure posed: No (Subcutaneous Tissue) Exposed: Yes posed: No posed: No osed: No sed: No % Reduction in Area: 0% % Reduction in Volume: 0% Tunneling: No Undermining: No Foul Odor Slough/Fi Fascia Ex Fat Layer Tendon Ex Muscle Ex Joint Exp Bone Expo Electronic Signature(s) Signed: 08/30/2019 4:25:17 PM By: Mikeal Hawthorne EMT/HBOT Entered By: Mikeal Hawthorne on 08/30/2019 11:39:13 -------------------------------------------------------------------------------- Wound Assessment Details Patient Name: Date of Service: [name], MCPHEARSON. 08/29/2019 9:15 AM Medical Record [id number] Patient Account Number: [id number] Date of Birth/Sex: Treating RN: 13-May-1941 (79 y.o. M) Primary Care Analiz Tvedt: Jeralyn Ruths Other Clinician: Referring Levi Klaiber: Treating Ariani Seier/Extender:Robson, Donneta Romberg, [facility] Weeks in Treatment: 26 Wound Status Wound Number: 3R Primary Diabetic Wound/Ulcer of the Lower Extremity Etiology: Wound Location: Right Calf - Posterior Wound Open Wounding Event: Gradually Appeared Status: Date Acquired: 02/28/2019 Comorbid Cataracts, Hypertension, Peripheral Venous Weeks Of Treatment: 26 History: Disease, Type II Diabetes, Gout, Received Clustered Wound: No Radiation Photos Wound Measurements Length: (cm) 1.5 Width: (cm) 1.3 Depth: (cm) 0.1 Area: (cm) 1.532 Volume: (cm) 0.153 Wound Description Classification: Grade 2 Exudate Amount: Medium Exudate Type: Serosanguineous Exudate Color: red, brown Wound Bed Granulation Amount: Large  (67-100%) Granulation Quality: Red, Pink, Pale r After Cleansing: No ibrino No Exposed Structure sed: No Subcutaneous Tissue) Exposed: Yes sed: No sed: No ed: No d: No % Reduction in Area: 93.8% % Reduction in Volume: 93.8% Epithelialization: None Tunneling: No Undermining: No Foul Odo Slough/F Fascia Expo Fat Layer ( Tendon Expo Muscle Expo Joint Expos Bone Expose Electronic Signature(s) Signed: 08/30/2019 4:25:17 PM By: Mikeal Hawthorne EMT/HBOT Entered By: Mikeal Hawthorne on 08/30/2019 11:38:51 -------------------------------------------------------------------------------- Vitals Details Patient Name: Date of Service: Daniel Pia D. 08/29/2019 9:15 AM Medical Record [id number] Patient Account Number: [id number] Date of Birth/Sex: Treating RN: 02/18/1941 (79 y.o. M) Primary Care Emelyn Roen: Jeralyn Ruths Other Clinician: Referring Kasidi Shanker: Treating Dinh Ayotte/Extender:Robson, Donneta Romberg, [facility] Weeks in Treatment: 26 Vital Signs Time Taken: 09:33 Temperature (F): 97.6 Height (in): 74 Pulse (bpm): 66 Weight (lbs): 212 Respiratory Rate (breaths/min): 18 Body Mass  Index (BMI): 27.2 Blood Pressure (mmHg): 149/79 Reference Range: 80 - 120 mg / dl Electronic Signature(s) Signed: 01/10/2020 9:25:05 AM By: Sandre Kitty Entered By: Sandre Kitty on 08/29/2019 09:33:58

## 2020-01-10 NOTE — Progress Notes (Signed)
Daniel, Gross ([id number]) Visit Report for 12/19/2019 Arrival Information Details Patient Name: Date of Service: [name], [name] 12/19/2019 2:00 PM Medical Record [place] Patient Account Number: [id number] Date of Birth/Sex: Treating RN: 09-Dec-1940 (79 y.o. [name]) [name] Primary Care Faisal Stradling: Jeralyn Ruths Other Clinician: Referring Quamaine Webb: Treating Tiawanna Luchsinger/Extender:Robson, Donneta Romberg, [facility] Weeks in Treatment: 39 Visit Information History Since Last Visit Added or deleted any medications: No Patient Arrived: Wheel Chair Any new allergies or adverse reactions: No Arrival Time: 14:52 Had a fall or experienced change in No Accompanied By: wife activities of daily living that may affect Transfer Assistance: None risk of falls: Patient Identification Verified: Yes Signs or symptoms of abuse/neglect since last No Secondary Verification Process Yes visito Completed: Hospitalized since last visit: No Patient Requires Transmission- No Implantable device outside of the clinic excluding No Based Precautions: cellular tissue based products placed in the center Patient Has Alerts: Yes since last visit: Patient Alerts: Patient on Blood Has Dressing in Place as Prescribed: Yes Thinner Pain Present Now: No Electronic Signature(s) Signed: 01/10/2020 9:21:08 AM By: Sandre Kitty Entered By: Sandre Kitty on 12/19/2019 14:52:29 -------------------------------------------------------------------------------- Clinic Level of Care Assessment Details Patient Name: Date of Service: Daniel, Gross 12/19/2019 2:00 PM Medical Record [id number] Patient Account Number: [id number] Date of Birth/Sex: Treating RN: 12-Jun-1941 (79 y.o. [name]) [name] Primary Care Haji Delaine: Jeralyn Ruths Other Clinician: Referring Misheel Gowans: Treating Kierre Deines/Extender:Robson, Donneta Romberg, [facility] Weeks in Treatment: 1 Clinic Level of Care Assessment Items TOOL 4 Quantity Score X -  Use when only an EandM is performed on FOLLOW-UP visit 1 0 ASSESSMENTS - Nursing Assessment / Reassessment X - Reassessment of Co-morbidities (includes updates in patient status) 1 10 X - Reassessment of Adherence to Treatment Plan 1 5 ASSESSMENTS - Wound and Skin Assessment / Reassessment X - Simple Wound Assessment / Reassessment - one wound 1 5 [] - Complex Wound Assessment / Reassessment - multiple wounds 0 [] - Dermatologic / Skin Assessment (not related to wound area) 0 ASSESSMENTS - Focused Assessment [] - Circumferential Edema Measurements - multi extremities 0 [] - Nutritional Assessment / Counseling / Intervention 0 [] - Lower Extremity Assessment (monofilament, tuning fork, pulses) 0 [] - Peripheral Arterial Disease Assessment (using hand held doppler) 0 ASSESSMENTS - Ostomy and/or Continence Assessment and Care [] - Incontinence Assessment and Management 0 [] - Ostomy Care Assessment and Management (repouching, etc.) 0 PROCESS - Coordination of Care X - Simple Patient / Family Education for ongoing care 1 15 [] - Complex (extensive) Patient / Family Education for ongoing care 0 X - Staff obtains Programmer, systems, Records, Test Results / Process Orders 1 10 [] - Staff telephones HHA, Nursing Homes / Clarify orders / etc 0 [] - Routine Transfer to another Facility (non-emergent condition) 0 [] - Routine Hospital Admission (non-emergent condition) 0 [] - New Admissions / Biomedical engineer / Ordering NPWT, Apligraf, etc. 0 [] - Emergency Hospital Admission (emergent condition) 0 X - Simple Discharge Coordination 1 10 [] - Complex (extensive) Discharge Coordination 0 PROCESS - Special Needs [] - Pediatric / Minor Patient Management 0 [] - Isolation Patient Management 0 [] - Hearing / Language / Visual special needs 0 [] - Assessment of Community assistance (transportation, D/C planning, etc.) 0 [] - Additional assistance / Altered mentation 0 [] - Support Surface(s) Assessment  (bed, cushion, seat, etc.) 0 INTERVENTIONS - Wound Cleansing / Measurement [] - Simple Wound Cleansing - one wound 0 X - Complex Wound Cleansing - multiple wounds  4 5 X - Wound Imaging (photographs - any number of wounds) 1 5 [] - Wound Tracing (instead of photographs) 0 [] - Simple Wound Measurement - one wound 0 X - Complex Wound Measurement - multiple wounds 4 5 INTERVENTIONS - Wound Dressings [] - Small Wound Dressing one or multiple wounds 0 [] - Medium Wound Dressing one or multiple wounds 0 X - Large Wound Dressing one or multiple wounds 2 20 [] - Application of Medications - topical 0 [] - Application of Medications - injection 0 INTERVENTIONS - Miscellaneous [] - External ear exam 0 [] - Specimen Collection (cultures, biopsies, blood, body fluids, etc.) 0 [] - Specimen(s) / Culture(s) sent or taken to Lab for analysis 0 [] - Patient Transfer (multiple staff / Civil Service fast streamer / Similar devices) 0 [] - Simple Staple / Suture removal (25 or less) 0 [] - Complex Staple / Suture removal (26 or more) 0 [] - Hypo / Hyperglycemic Management (close monitor of Blood Glucose) 0 [] - Ankle / Brachial Index (ABI) - do not check if billed separately 0 X - Vital Signs 1 5 Has the patient been seen at the hospital within the last three years: Yes Total Score: 145 Level Of Care: New/Established - Level 4 Electronic Signature(s) Signed: 12/19/2019 5:59:23 PM By: [name] RN Entered By: [name] on 12/19/2019 15:59:52 -------------------------------------------------------------------------------- Encounter Discharge Information Details Patient Name: Date of Service: Daniel Pia D. 12/19/2019 2:00 PM Medical Record [id number] Patient Account Number: [id number] Date of Birth/Sex: Treating RN: Feb 01, 1941 (79 y.o. [name] Primary Care Arli Bree: Jeralyn Ruths Other Clinician: Referring Paytience Bures: Treating Shatarra Wehling/Extender:Robson, Donneta Romberg, [facility] Weeks in Treatment:  31 Encounter Discharge Information Items Discharge Condition: Stable Ambulatory Status: Wheelchair Discharge Destination: Home Transportation: Private Auto Accompanied By: wife Schedule Follow-up Appointment: Yes Clinical Summary of Care: Patient Declined Electronic Signature(s) Signed: 12/19/2019 5:48:19 PM By: Kela Millin Entered By: Kela Millin on 12/19/2019 16:23:16 -------------------------------------------------------------------------------- Lower Extremity Assessment Details Patient Name: Date of Service: MILANO, ROSEVEAR 12/19/2019 2:00 PM Medical Record [id number] Patient Account Number: [id number] Date of Birth/Sex: Treating RN: 27-Feb-1941 (79 y.o. [name] Primary Care Miller Limehouse: Jeralyn Ruths Other Clinician: Referring Kamaree Wheatley: Treating Sheela Mcculley/Extender:Robson, Donneta Romberg, [facility] Weeks in Treatment: 42 Edema Assessment Assessed: [Left: No] [Right: No] Edema: [Left: Yes] [Right: Yes] Calf Left: Right: Point of Measurement: 31 cm From Medial Instep 35 cm 32.5 cm Ankle Left: Right: Point of Measurement: 11 cm From Medial Instep 20.5 cm 22.5 cm Vascular Assessment Pulses: Dorsalis Pedis Palpable: [Left:Yes] [Right:Yes] Electronic Signature(s) Signed: 12/19/2019 5:48:19 PM By: Kela Millin Entered By: Kela Millin on 12/19/2019 15:19:14 -------------------------------------------------------------------------------- Multi Wound Chart Details Patient Name: Date of Service: Daniel Pia D. 12/19/2019 2:00 PM Medical Record [id number] Patient Account Number: [id number] Date of Birth/Sex: Treating RN: Jan 07, 1941 (78 y.o. [name]) [name] Primary Care Violette Morneault: Jeralyn Ruths Other Clinician: Referring Teddi Badalamenti: Treating Oma Alpert/Extender:Robson, Donneta Romberg, [name] Weeks in Treatment: 42 Vital Signs Height(in): 74 Pulse(bpm): 109 Weight(lbs): 212 Blood Pressure(mmHg): 134/70 Body Mass Index(BMI): 27 Temperature(F):  98.2 Respiratory 20 Rate(breaths/min): Photos: [13:No Photos] [19:No Photos] [21:No Photos] Wound Location: [13:Left Toe Second] [19:Left Lower Leg - Anterior Right Toe Second] Wounding Event: [13:Gradually Appeared] [19:Gradually Appeared] [21:Gradually Appeared] 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] [21:Diabetic Wound/Ulcer of the Lower Extremity] Secondary Etiology: [13:N/A] [19:N/A] [21:N/A] Comorbid History: [13:Cataracts, Hypertension, Cataracts, Hypertension, Cataracts, Hypertension, Peripheral Venous Disease, Peripheral Venous Disease, Peripheral Venous Disease, Type II Diabetes, Gout, Received Radiation] [19:Type II Diabetes, Gout,  Received Radiation] [21:Type II Diabetes, Gout, Received Radiation] Date Acquired: [13:08/22/2019] [19:09/23/2019] [21:09/23/2019] Weeks of Treatment: [13:17] [19:12] [21:12] Wound Status: [13:Amputation] [19:Open] [21:Amputation] Wound Recurrence: [13:No] [19:No] [21:No] Clustered Wound: [13:No] [19:No] [21:No] Clustered Quantity: [13:N/A] [19:1] [21:N/A] Measurements L x W x D 0x0x0 [19:3x3x0] [21:0x0x0] (cm) Area (cm) : [13:0] [19:7.069] [21:0] Volume (cm) : [13:0] [76:5.465] [21:0] % Reduction in Area: [13:100.00%] [19:86.80%] [21:100.00%] % Reduction in Volume: 100.00% [19:86.80%] [21:100.00%] Classification: [13:Grade 2] [19:Full Thickness Without Exposed Support Structures] [21:Grade 2] Exudate Amount: [13:None Present] [19:Small] [21:None Present] Exudate Type: [13:N/A] [19:Serosanguineous] [21:N/A] Exudate Color: [13:N/A] [19:red, brown] [21:N/A] Wound Margin: [13:Distinct, outline attached Flat and Intact] [21:Flat and Intact] Granulation Amount: [13:None Present (0%)] [19:Large (67-100%)] [21:None Present (0%)] Granulation Quality: [13:N/A] [19:Red, Hyper-granulation] [21:N/A] Necrotic Amount: [13:None Present (0%)] [19:None Present (0%)] [21:None Present (0%)] Exposed  Structures: [13:Fascia: No Fat Layer (Subcutaneous Tissue) Exposed: No Tendon: No Muscle: No Joint: No Bone: No] [19:Fat Layer (Subcutaneous Tissue) Exposed: Yes Fascia: No Tendon: No Muscle: No Joint: No Bone: No] [21:Fascia: No Fat Layer (Subcutaneous Tissue)  Exposed: No Tendon: No Muscle: No Joint: No Bone: No] Epithelialization: [13:None] [19:Small (1-33%) 24 25] [21:None] Photos: [13:No Photos] [19:No Photos] [21:No Photos] Wound Location: [13:Left Lower Leg - Anterior, Distal] [19:Left Lower Leg - Posterior, Right Calf - Posterior Distal] Wounding Event: [13:Gradually Appeared] [19:Gradually Appeared] [21:Gradually Appeared] Primary Etiology: [13:Venous Leg Ulcer] [19:Venous Leg Ulcer] [21:Diabetic Wound/Ulcer of the Lower Extremity] Secondary Etiology: [13:Diabetic Wound/Ulcer of the Diabetic Wound/Ulcer of the N/A Lower Extremity] [19:Lower Extremity] Comorbid History: [13:Cataracts, Hypertension, Cataracts, Hypertension, Cataracts, Hypertension, Peripheral Venous Disease, Peripheral Venous Disease, Peripheral Venous Disease, Type II Diabetes, Gout, Received Radiation] [19:Type II Diabetes, Gout,  Received Radiation] [21:Type II Diabetes, Gout, Received Radiation] Date Acquired: [13:10/04/2019] [19:10/04/2019] [21:02/28/2019] Weeks of Treatment: [13:10] [19:10] [21:42] Wound Status: [13:Open] [19:Open] [21:Open] Wound Recurrence: [13:No] [19:No] [21:Yes] Clustered Wound: [13:Yes] [19:Yes] [21:No] Clustered Quantity: [13:1] [19:1] [21:N/A] Measurements L x W x D 1x1x0.1 [19:1.2x0.8x0.1] [21:1.3x1x0.1] (cm) Area (cm) : [13:0.785] [19:0.754] [21:1.021] Volume (cm) : [13:0.079] [19:0.075] [21:0.102] % Reduction in Area: [13:87.70%] [19:97.00%] [21:95.80%] % Reduction in Volume: 87.60% [19:97.00%] [21:95.80%] Classification: [13:Full Thickness Without Exposed Support Structures Exposed Support Structures] [19:Full Thickness Without] [21:Grade 2] Exudate Amount: [13:Small] [19:Small]  [21:Small] Exudate Type: [13:Serosanguineous] [19:Serosanguineous] [21:Serosanguineous] Exudate Color: [13:red, brown] [19:red, brown] [21:red, brown] Wound Margin: [13:Flat and Intact] [19:Flat and Intact] [21:Distinct, outline attached] Granulation Amount: [13:Large (67-100%)] [19:Medium (34-66%)] [21:Large (67-100%)] Granulation Quality: [13:Red] [19:Pink] [21:Red] Necrotic Amount: [13:None Present (0%)] [19:Medium (34-66%)] [21:None Present (0%)] Exposed Structures: [13:Fat Layer (Subcutaneous Fat Layer (Subcutaneous Fat Layer (Subcutaneous Tissue) Exposed: Yes Fascia: No Tendon: No Muscle: No Joint: No Bone: No Medium (34-66%)] [19:Tissue) Exposed: Yes Fascia: No Tendon: No Muscle: No Joint: No Bone: No Small  (1-33%)] [21:Tissue) Exposed: Yes Fascia: No Tendon: No Muscle: No Joint: No Bone: No Small (1-33%)] Treatment Notes Electronic Signature(s) Signed: 12/19/2019 5:53:09 PM By: [name] MD Signed: 12/19/2019 5:59:23 PM By: [name] RN Entered By: [name] on 12/19/2019 15:56:32 -------------------------------------------------------------------------------- Multi-Disciplinary Care Plan Details Patient Name: Date of Service: Daniel Pia D. 12/19/2019 2:00 PM Medical Record [id number] Patient Account Number: [id number] Date of Birth/Sex: Treating RN: 1941-05-18 (78 y.o. [name] Primary Care Provider: Jeralyn Ruths Other Clinician: Referring Provider: Treating Provider/Extender:Robson, Donneta Romberg, [name] Weeks in Treatment: 25 Active Inactive Wound/Skin Impairment Nursing Diagnoses: Knowledge deficit related to ulceration/compromised skin integrity Goals: Patient/caregiver will verbalize understanding of skin care regimen Date Initiated: 02/28/2019 Target Resolution Date: 12/29/2019 Goal  Status: Active Ulcer/skin breakdown will have a volume reduction of 30% by week 4 Date Initiated: 02/28/2019 Date Inactivated: 04/04/2019 Target Resolution Date:  03/31/2019 Goal Status: Met Ulcer/skin breakdown will have a volume reduction of 50% by week 8 Date Initiated: 04/04/2019 Date Inactivated: 05/09/2019 Target Resolution Date: 05/05/2019 Goal Status: Met Ulcer/skin breakdown will have a volume reduction of 80% by week 12 Date Initiated: 05/09/2019 Date Inactivated: 06/13/2019 Target Resolution Date: 06/09/2019 Unmet Goal Status: Unmet Reason: comorbities/new wounds Ulcer/skin breakdown will heal within 14 weeks Date Initiated: 06/13/2019 Date Inactivated: 07/11/2019 Target Resolution Date: 07/07/2019 Unmet Goal Status: Unmet Reason: comorbityies Interventions: Assess patient/caregiver ability to obtain necessary supplies Assess patient/caregiver ability to perform ulcer/skin care regimen upon admission and as needed Assess ulceration(s) every visit Notes: Electronic Signature(s) Signed: 12/19/2019 5:59:23 PM By: [name] RN Entered By: [name] on 12/19/2019 14:34:27 -------------------------------------------------------------------------------- Pain Assessment Details Patient Name: Date of Service: DEJA, KAIGLER 12/19/2019 2:00 PM Medical Record [id number] Patient Account Number: [id number] Date of Birth/Sex: Treating RN: 12/05/1940 (78 y.o. [name]) [name] Primary Care Provider: Jeralyn Ruths Other Clinician: Referring Provider: Treating Provider/Extender:Robson, Donneta Romberg, [facility] Weeks in Treatment: 25 Active Problems Location of Pain Severity and Description of Pain Patient Has Paino No Site Locations Pain Management and Medication Current Pain Management: Electronic Signature(s) Signed: 12/19/2019 5:59:23 PM By: [name] RN Signed: 01/10/2020 9:21:08 AM By: Sandre Kitty Entered By: Sandre Kitty on 12/19/2019 14:52:38 -------------------------------------------------------------------------------- Patient/Caregiver Education Details Patient Name: Date of Service: Powe, Yer D.  3/23/2021andnbsp2:00 PM Medical Record [id number] Patient Account Number: [id number] Date of Birth/Gender: Treating RN: [birth year] (78 y.o. [name] Primary Care Physician: Jeralyn Ruths Other Clinician: Referring Physician: Treating Physician/Extender:Robson, Donneta Romberg, [facility] Weeks in Treatment: 32 Education Assessment Education Provided To: Patient Education Topics Provided Wound/Skin Impairment: Methods: Explain/Verbal Responses: State content correctly Electronic Signature(s) Signed: 12/19/2019 5:59:23 PM By: [name] RN Entered By: [name] on 12/19/2019 14:34:42 -------------------------------------------------------------------------------- Wound Assessment Details Patient Name: Date of Service: SARIM, ROTHMAN 12/19/2019 2:00 PM Medical Record [id number] Patient Account Number: [id number] Date of Birth/Sex: Treating RN: Oct 01, 1940 (79 y.o. [name] Primary Care Provider: Jeralyn Ruths Other Clinician: Referring Provider: Treating Provider/Extender:Robson, Donneta Romberg, [facility] Weeks in Treatment: 42 Wound Status Wound Number: 13 Primary Diabetic Wound/Ulcer of the Lower Extremity Etiology: Wound Location: Left Toe Second Wound Amputation Wounding Event: Gradually Appeared Status: Date Acquired: 08/22/2019 Outcome Toe/Ray Weeks Of Treatment: 17 Level: Clustered Wound: No Comorbid Cataracts, Hypertension, Peripheral Venous History: Disease, Type II Diabetes, Gout, Received Radiation Wound Measurements Length: (cm) 0 % Reducti Width: (cm) 0 % Reducti Depth: (cm) 0 Epithelia Area: (cm) 0 Tunnelin Volume: (cm) 0 Undermin Wound Description Classification: Grade 2 Wound Margin: Distinct, outline attached Exudate Amount: None Present Wound Bed Granulation Amount: None Present (0%) Necrotic Amount: None Present (0%) Foul Odor After Cleansing: No Slough/Fibrino No Exposed Structure Fascia Exposed: No Fat Layer  (Subcutaneous Tissue) Exposed: No Tendon Exposed: No Muscle Exposed: No Joint Exposed: No Bone Exposed: No on in Area: 100% on in Volume: 100% lization: None g: No ing: No Electronic Signature(s) Signed: 12/19/2019 5:48:19 PM By: Kela Millin Entered By: Kela Millin on 12/19/2019 15:20:25 -------------------------------------------------------------------------------- Wound Assessment Details Patient Name: Date of Service: Daniel Pia D. 12/19/2019 2:00 PM Medical Record [id number] Patient Account Number: [id number] Date of Birth/Sex: Treating RN: 1941-02-01 (78 y.o. [name] Primary Care Provider: Jeralyn Ruths Other Clinician: Referring Provider: Treating Provider/Extender:Robson, Donneta Romberg, [facility] Weeks in Treatment: 42 Wound Status Wound Number: [id number]  Primary Venous Leg Ulcer Etiology: Wound Location: Left Lower Leg - Anterior Wound Open Wounding Event: Gradually Appeared Status: Date Acquired: 09/23/2019 Comorbid Cataracts, Hypertension, Peripheral Venous Weeks Of Treatment: 12 History: Disease, Type II Diabetes, Gout, Received Clustered Wound: No Radiation Photos Photo Uploaded By: Mikeal Hawthorne on 12/21/2019 14:43:34 Wound Measurements Length: (cm) 3 % Reduct Width: (cm) 3 % Reduct Depth: (cm) 0 Epitheli Clustered Quantity: 1 Tunnelin Area: (cm) 7.069 Undermi Volume: (cm) 0.707 Wound Description Classification: Full Thickness Without Exposed Support Foul Odo Structures Slough/F Wound Flat and Intact Margin: Exudate Small Amount: Exudate Serosanguineous Serosanguineous Type: Exudate red, brown Color: Wound Bed Granulation Amount: Large (67-100%) Granulation Quality: Red, Hyper-granulation Fascia Ex Necrotic Amount: None Present (0%) Fat Layer Tendon Ex Muscle Ex Joint Exp Bone Expo r After Cleansing: No ibrino No Exposed Structure posed: No (Subcutaneous Tissue) Exposed: Yes posed: No posed: No osed: No sed:  No ion in Area: 86.8% ion in Volume: 86.8% alization: Small (1-33%) g: No ning: No Electronic Signature(s) Signed: 12/19/2019 5:48:19 PM By: Kela Millin Signed: 12/19/2019 5:59:23 PM By: [name] RN Entered By: Kela Millin on 12/19/2019 15:22:06 -------------------------------------------------------------------------------- Wound Assessment Details Patient Name: Date of Service: Daniel Pia D. 12/19/2019 2:00 PM Medical Record [id number] Patient Account Number: [id number] Date of Birth/Sex: Treating RN: September 24, 1941 (79 y.o. [name] Primary Care Provider: Jeralyn Ruths Other Clinician: Referring Provider: Treating Provider/Extender:Robson, Donneta Romberg, [facility] Weeks in Treatment: 42 Wound Status Wound Number: 21 Primary Diabetic Wound/Ulcer of the Lower Extremity Etiology: Wound Location: Right Toe Second Wound Amputation Wounding Event: Gradually Appeared Status: Date Acquired: 09/23/2019 Outcome Toe/Ray Weeks Of Treatment: 12 Level: Clustered Wound: No Comorbid Cataracts, Hypertension, Peripheral Venous History: Disease, Type II Diabetes, Gout, Received Radiation Wound Measurements Length: (cm) 0 % Reducti Width: (cm) 0 % Reducti Depth: (cm) 0 Epithelia Area: (cm) 0 Tunnelin Volume: (cm) 0 Undermin Wound Description Classification: Grade 2 Wound Margin: Flat and Intact Exudate Amount: None Present Wound Bed Granulation Amount: None Present (0%) Necrotic Amount: None Present (0%) Foul Odor After Cleansing: No Slough/Fibrino No Exposed Structure Fascia Exposed: No Fat Layer (Subcutaneous Tissue) Exposed: No Tendon Exposed: No Muscle Exposed: No Joint Exposed: No Bone Exposed: No on in Area: 100% on in Volume: 100% lization: None g: No ing: No Electronic Signature(s) Signed: 12/19/2019 5:48:19 PM By: Kela Millin Entered By: Kela Millin on 12/19/2019  15:20:47 -------------------------------------------------------------------------------- Wound Assessment Details Patient Name: Date of Service: Daniel Pia D. 12/19/2019 2:00 PM Medical Record [id number] Patient Account Number: [id number] Date of Birth/Sex: Treating RN: March 06, 1941 (78 y.o. [name]) [name] Primary Care Provider: Jeralyn Ruths Other Clinician: Referring Provider: Treating Provider/Extender:Robson, Donneta Romberg, [name] Weeks in Treatment: 42 Wound Status Wound Number: 24 Primary Venous Leg Ulcer Etiology: Wound Location: Left Lower Leg - Anterior, Distal Secondary Diabetic Wound/Ulcer of the Lower Extremity Wounding Event: Gradually Appeared Etiology: Date Acquired: 10/04/2019 Wound Open Weeks Of Treatment: 10 Status: Clustered Wound: Yes Comorbid Cataracts, Hypertension, Peripheral Venous History: Disease, Type II Diabetes, Gout, Received Radiation Photos Photo Uploaded By: Mikeal Hawthorne on 12/21/2019 14:43:35 Wound Measurements Length: (cm) 1 % Reducti Width: (cm) 1 % Reducti Depth: (cm) 0.1 Epithelia Clustered Quantity: 1 Tunneling Area: (cm) 0.785 Undermin Volume: (cm) 0.079 Wound Description Classification: Full Thickness Without Exposed Support Foul O Structures Slough Wound Flat and Intact Margin: Exudate Small Amount: Exudate Serosanguineous Type: Exudate red, brown Color: Wound Bed Granulation Amount: Large (67-100%) Granulation Quality: Red Fascia Necrotic Amount: None Present (0%) Fat La Tendon Muscle Joint Bone E  dor After Cleansing: No /Fibrino No Exposed Structure Exposed: No yer (Subcutaneous Tissue) Exposed: Yes Exposed: No Exposed: No Exposed: No xposed: No on in Area: 87.7% on in Volume: 87.6% lization: Medium (34-66%) : No ing: No Electronic Signature(s) Signed: 12/19/2019 5:48:19 PM By: Kela Millin Signed: 12/19/2019 5:59:23 PM By: [name] RN Entered By: Kela Millin on 12/19/2019  15:23:16 -------------------------------------------------------------------------------- Wound Assessment Details Patient Name: Date of Service: Daniel Pia D. 12/19/2019 2:00 PM Medical Record [id number] Patient Account Number: [id number] Date of Birth/Sex: Treating RN: December 19, 1940 (78 y.o. [name]) [name] Primary Care Provider: Jeralyn Ruths Other Clinician: Referring Provider: Treating Provider/Extender:Robson, Donneta Romberg, [place] Weeks in Treatment: 42 Wound Status Wound Number: 25 Primary Venous Leg Ulcer Etiology: Wound Location: Left Lower Leg - Posterior, Distal Secondary Diabetic Wound/Ulcer of the Lower Extremity Wounding Event: Gradually Appeared Etiology: Date Acquired: 10/04/2019 Wound Open Weeks Of Treatment: 10 Status: Clustered Wound: Yes Comorbid Cataracts, Hypertension, Peripheral Venous History: Disease, Type II Diabetes, Gout, Received Radiation Photos Photo Uploaded By: Mikeal Hawthorne on 12/21/2019 14:43:07 Wound Measurements Length: (cm) 1.2 % Reduct Width: (cm) 0.8 % Reduct Depth: (cm) 0.1 Epitheli Clustered Quantity: 1 Tunnelin Area: (cm) 0.754 Undermi Volume: (cm) 0.075 Wound Description Classification: Full Thickness Without Exposed Support Foul Odo Structures Slough/F Wound Flat and Intact Margin: Exudate Small Amount: Exudate Serosanguineous Type: Exudate red, brown Color: Wound Bed Granulation Amount: Medium (34-66%) Granulation Quality: Pink Fascia E Necrotic Amount: Medium (34-66%) Fat Laye Necrotic Quality: Adherent Slough Tendon E Muscle E Joint Ex Bone Exp r After Cleansing: No ibrino Yes Exposed Structure xposed: No r (Subcutaneous Tissue) Exposed: Yes xposed: No xposed: No posed: No osed: No ion in Area: 97% ion in Volume: 97% alization: Small (1-33%) g: No ning: No Electronic Signature(s) Signed: 12/19/2019 5:48:19 PM By: Kela Millin Signed: 12/19/2019 5:59:23 PM By: [name] RN Entered By:  Kela Millin on 12/19/2019 15:23:57 -------------------------------------------------------------------------------- Wound Assessment Details Patient Name: Date of Service: Daniel Pia D. 12/19/2019 2:00 PM Medical Record [id number] Patient Account Number: [id number] Date of Birth/Sex: Treating RN: [birth year] (78 y.o. [name]) [name] Primary Care Provider: Jeralyn Ruths Other Clinician: Referring Provider: Treating Provider/Extender:Robson, Donneta Romberg, [place] Weeks in Treatment: 42 Wound Status Wound Number: [id number] Primary Diabetic Wound/Ulcer of the Lower Extremity Etiology: Wound Location: Right Calf - Posterior Wound Open Wounding Event: Gradually Appeared Status: Date Acquired: 02/28/2019 Comorbid Cataracts, Hypertension, Peripheral Venous Weeks Of Treatment: 42 History: Disease, Type II Diabetes, Gout, Received Clustered Wound: No Radiation Photos Photo Uploaded By: Mikeal Hawthorne on 12/21/2019 14:43:08 Wound Measurements Length: (cm) 1.3 % Reduction Width: (cm) 1 % Reduction Depth: (cm) 0.1 Epitheliali Area: (cm) 1.021 Tunneling: Volume: (cm) 0.102 Underminin Wound Description Classification: Grade 2 Wound Margin: Distinct, outline attached Exudate Amount: Small Exudate Type: Serosanguineous Exudate Color: red, brown Wound Bed Granulation Amount: Large (67-100%) Granulation Quality: Red Necrotic Amount: None Present (0%) Foul Odor After Cleansing: No Slough/Fibrino No Exposed Structure Fascia Exposed: No Fat Layer (Subcutaneous Tissue) Exposed: Yes Tendon Exposed: No Muscle Exposed: No Joint Exposed: No Bone Exposed: No in Area: 95.8% in Volume: 95.8% zation: Small (1-33%) No g: No Electronic Signature(s) Signed: 12/19/2019 5:48:19 PM By: Kela Millin Signed: 12/19/2019 5:59:23 PM By: [name] RN Entered By: Kela Millin on 12/19/2019  15:24:23 -------------------------------------------------------------------------------- Vitals Details Patient Name: Date of Service: Daniel Pia D. 12/19/2019 2:00 PM Medical Record [id number] Patient Account Number: [id number] Date of Birth/Sex: Treating RN: October 27, 1940 (78 y.o. [name] Primary Care Provider: Jeralyn Ruths Other Clinician: Referring Provider:  Treating Provider/Extender:Robson, Donneta Romberg, [name] Weeks in Treatment: 42 Vital Signs Time Taken: 14:52 Temperature (F): 98.2 Height (in): 74 Pulse (bpm): 109 Weight (lbs): 212 Respiratory Rate (breaths/min): 20 Body Mass Index (BMI): 27.2 Blood Pressure (mmHg): 134/70 Reference Range: 80 - 120 mg / dl Electronic Signature(s) Signed: 01/10/2020 9:21:08 AM By: Sandre Kitty Entered By: Sandre Kitty on 12/19/2019 14:54:10

## 2020-01-12 ENCOUNTER — Other Ambulatory Visit: Payer: Self-pay

## 2020-01-12 ENCOUNTER — Ambulatory Visit: Payer: Medicare HMO | Admitting: Cardiovascular Disease

## 2020-01-12 ENCOUNTER — Encounter: Payer: Self-pay | Admitting: Cardiovascular Disease

## 2020-01-12 VITALS — BP 124/74 | HR 79 | Temp 96.2°F | Ht 74.5 in | Wt 200.0 lb

## 2020-01-12 DIAGNOSIS — I2583 Coronary atherosclerosis due to lipid rich plaque: Secondary | ICD-10-CM

## 2020-01-12 DIAGNOSIS — Z7901 Long term (current) use of anticoagulants: Secondary | ICD-10-CM | POA: Diagnosis not present

## 2020-01-12 DIAGNOSIS — I2510 Atherosclerotic heart disease of native coronary artery without angina pectoris: Secondary | ICD-10-CM

## 2020-01-12 DIAGNOSIS — I5022 Chronic systolic (congestive) heart failure: Secondary | ICD-10-CM | POA: Diagnosis not present

## 2020-01-12 DIAGNOSIS — I447 Left bundle-branch block, unspecified: Secondary | ICD-10-CM

## 2020-01-12 DIAGNOSIS — I428 Other cardiomyopathies: Secondary | ICD-10-CM

## 2020-01-12 DIAGNOSIS — I4821 Permanent atrial fibrillation: Secondary | ICD-10-CM

## 2020-01-12 DIAGNOSIS — I10 Essential (primary) hypertension: Secondary | ICD-10-CM | POA: Diagnosis not present

## 2020-01-12 NOTE — Addendum Note (Signed)
Addended by: Barbarann Ehlers A on: 01/12/2020 01:19 PM   Modules accepted: Orders

## 2020-01-12 NOTE — Progress Notes (Signed)
SUBJECTIVE: [name] is a 79 y.o. male with past medical history of chronic combined systolic and diastolic congestive heart failure/nonischemic cardiomyopathy (EF 45% in 2016, at 20-25% by repeat echo in 08/2019), CAD (nonobstructive CAD by cath in 09/2019), permanent atrial fibrillation (on Coumadin), hypertension, and hyperlipidemia.  He has chronic lower extremity edema and chronic wounds and follows with the wound center.  He denies chest pain, orthopnea, palpitations, and shortness of breath.   He is here with his wife.    Review of Systems: As per "subjective", otherwise negative.  Allergies  Allergen Reactions  . Clarithromycin Other (See Comments)    "aches & pains all over", no appetite, sleepy  . Fluoride Preparations Other (See Comments)    unknown  . Toprol Xl [Metoprolol Tartrate]     Worsening Dyspnea with this in 08/2018    Current Outpatient Medications  Medication Sig Dispense Refill  . acetaminophen (TYLENOL) 500 MG tablet Take 500-1,000 mg by mouth every 6 (six) hours as needed for mild pain or headache.     . allopurinol (ZYLOPRIM) 300 MG tablet Take 1 tablet (300 mg total) by mouth 2 (two) times daily. 180 tablet 0  . ascorbic acid (VITAMIN C) 250 MG CHEW Chew 500 mg by mouth daily.     . Cholecalciferol (VITAMIN D-3) 125 MCG (5000 UT) TABS Take 5,000 Units by mouth daily.     . furosemide (LASIX) 20 MG tablet Take 20 mg by mouth daily.    . furosemide (LASIX) 40 MG tablet Take 40 mg one day, alternating with 20 mg the other day 90 tablet 3  . glipiZIDE (GLUCOTROL) 5 MG tablet Take 1 tablet (5 mg total) by mouth daily. 90 tablet 0  . HYDROcodone-acetaminophen (NORCO/VICODIN) 5-325 MG tablet Take 1 tablet by mouth every 6 (six) hours as needed for moderate pain. 30 tablet 0  . lovastatin (MEVACOR) 40 MG tablet Take 1 tablet (40 mg total) by mouth at bedtime. (Patient taking differently: Take 20 mg by mouth at bedtime. ) 90 tablet 1  . NON  FORMULARY Take 3 tablets by mouth daily. MANGA-CAL    . oxyCODONE-acetaminophen (PERCOCET) 5-325 MG tablet Take 1 tablet by mouth every 6 (six) hours as needed for severe pain. 30 tablet 0  . sacubitril-valsartan (ENTRESTO) 24-26 MG Take 1 tablet by mouth 2 (two) times daily. 60 tablet 11  . spironolactone (ALDACTONE) 25 MG tablet Take 1 tablet (25 mg total) by mouth daily. 90 tablet 3  . vitamin E 400 UNIT capsule Take 400 Units by mouth daily.     Marland Kitchen warfarin (COUMADIN) 5 MG tablet Take 1 tablet (5 mg total) by mouth See admin instructions. 2.5 mg on Saturday All the other day take 5 mg (Patient taking differently: Take 2.5-5 mg by mouth See admin instructions. Take 5 mg at night except Saturday night take 2.5 mg) 90 tablet 1  . carvedilol (COREG) 25 MG tablet Take 1.5 tablets (37.5 mg total) by mouth 2 (two) times daily. 270 tablet 3   No current facility-administered medications for this visit.    Past Medical History:  Diagnosis Date  . Cancer ([place])   . CHF (congestive heart failure) ([place])   . Chronic atrial fibrillation ([place])   . Chronic bronchitis   . Diabetes mellitus, type II ([place])    no insulin  . Gout   . History of herpes zoster virus   . History of radiation therapy 12/21/12- 02/15/13   prostate  79 gray in 40 fx  . Hyperlipidemia   . Hypertension   . Prostate cancer ([place]) 2014   EBRT + hormonal therapy  . Vitamin D deficiency disease 06/07/2019    Past Surgical History:  Procedure Laterality Date  . AMPUTATION Bilateral 12/18/2019   Procedure: BILATERAL 2ND TOE AMPUTATION DIGIT;  Surgeon: Edrick Kins, DPM;  Location: [facility];  Service: Podiatry;  Laterality: Bilateral;  . CATARACT EXTRACTION, BILATERAL    . KNEE SURGERY     left knee  . PROSTATE BIOPSY    . RIGHT/LEFT HEART CATH AND CORONARY ANGIOGRAPHY N/A 10/04/2019   Procedure: RIGHT/LEFT HEART CATH AND CORONARY ANGIOGRAPHY;  Surgeon: Belva Crome, MD;  Location: [facility];  Service: Cardiovascular;   Laterality: N/A;    Social History   Socioeconomic History  . Marital status: Married    Spouse name: Not on file  . Number of children: Not on file  . Years of education: Not on file  . Highest education level: Not on file  Occupational History  . Not on file  Tobacco Use  . Smoking status: Never Smoker  . Smokeless tobacco: Never Used  Substance and Sexual Activity  . Alcohol use: No    Alcohol/week: 0.0 standard drinks  . Drug use: No  . Sexual activity: Not on file  Other Topics Concern  . Not on file  Social History Narrative  . Not on file   Social Determinants of Health   Financial Resource Strain:   . Difficulty of Paying Living Expenses:   Food Insecurity:   . Worried About Charity fundraiser in the Last Year:   . Arboriculturist in the Last Year:   Transportation Needs:   . Film/video editor (Medical):   Marland Kitchen Lack of Transportation (Non-Medical):   Physical Activity:   . Days of Exercise per Week:   . Minutes of Exercise per Session:   Stress:   . Feeling of Stress :   Social Connections:   . Frequency of Communication with Friends and Family:   . Frequency of Social Gatherings with Friends and Family:   . Attends Religious Services:   . Active Member of Clubs or Organizations:   . Attends Archivist Meetings:   Marland Kitchen Marital Status:   Intimate Partner Violence:   . Fear of Current or Ex-Partner:   . Emotionally Abused:   Marland Kitchen Physically Abused:   . Sexually Abused:      Vitals:   01/12/20 1249  BP: 124/74  Pulse: 79  Temp: (!) 96.2 F (35.7 C)  SpO2: 97%  Height: 6' 2.5" (1.892 m)    Wt Readings from Last 3 Encounters:  12/18/19 194 lb 8 oz (88.2 kg)  12/14/19 195 lb 6.4 oz (88.6 kg)  11/15/19 201 lb 3.2 oz (91.3 kg)     PHYSICAL EXAM General: NAD HEENT: Normal. Neck: No JVD, no thyromegaly. Lungs: Clear to auscultation bilaterally with normal respiratory effort. CV: Regular rate and irregular rhythm, normal S1/S2, no S3,  no murmur. Chronic trivial b/l LE edema.  Abdomen: Soft, nontender, no distention.  Neurologic: Alert and oriented.  Psych: Normal affect. Skin: Normal. Musculoskeletal: No gross deformities.      Labs: Lab Results  Component Value Date/Time   K 4.3 12/18/2019 11:13 AM   BUN 29 (H) 12/18/2019 11:13 AM   CREATININE 0.90 12/18/2019 11:13 AM   CREATININE 1.14 06/07/2019 02:05 PM   ALT 42 06/07/2019 02:05 PM  HGB 12.9 (L) 12/18/2019 11:13 AM     Lipids: Lab Results  Component Value Date/Time   LDLCALC 132 (H) 02/13/2011 10:55 AM   LDLCALC 152 01/21/2009 12:00 AM   CHOL 196 02/13/2011 10:55 AM   TRIG 92 02/13/2011 10:55 AM   TRIG 110 01/21/2009 12:00 AM   HDL 46 02/13/2011 10:55 AM      Echocardiogram: 01/03/2020 IMPRESSIONS    1. Left ventricular ejection fraction, by estimation, is 25 to 30%. The  left ventricle has severely decreased function. The left ventricle  demonstrates global hypokinesis. There is mild left ventricular  hypertrophy. Left ventricular diastolic parameters  are indeterminate.  2. Right ventricular systolic function is mildly reduced. The right  ventricular size is normal. There is mildly elevated pulmonary artery  systolic pressure.  3. Left atrial size was severely dilated.  4. Right atrial size was moderately dilated.  5. The mitral valve is normal in structure. Trivial mitral valve  regurgitation. No evidence of mitral stenosis.  6. Tricuspid valve regurgitation is moderate.  7. The aortic valve has an indeterminant number of cusps. Aortic valve  regurgitation is not visualized. No aortic stenosis is present.  8. The inferior vena cava is normal in size with greater than 50%  respiratory variability, suggesting right atrial pressure of 3 mmHg.     Cardiac Catheterization: 09/2019  Widely patent coronary arteries, essentially normal for the patient's age. Trivial mid LAD less than 25% eccentric plaque.  Mild pulmonary  hypertension with mean pulmonary artery pressure of 31 mmHg  Severe left ventricular systolic dysfunction with EF less than 25%. Elevated LVEDP at 21 mmHg consistent with acute on chronic combined systolic and diastolic heart failure.  RECOMMENDATIONS:   Guideline directed therapy for heart failure and atrial fibrillation rate control. Heart rate was relatively fast during the procedure.  Resume usual Coumadin dose later today.  Discharge home later today.   ASSESSMENT AND PLAN:  1.  Chronic systolic heart failure/nonischemic cardiomyopathy: LVEF 25 to 30% by echocardiogram on 01/03/2020.  Cardiac catheterization in January 2021 showed nonobstructive coronary artery disease.  QRS duration 170 ms by ECG on 09/17/2019.  I will make a referral to EP for consideration of CRT-D. Continue carvedilol 37.5 mg twice daily, Entresto 24-26 mg twice daily, spironolactone 25 mg daily and Lasix 40 mg daily alternating with 20 mg daily.  2.  Permanent atrial fibrillation: Heart rate controlled on carvedilol 37.5 mg twice daily.  He has been intolerant to metoprolol succinate in the past.  Continue warfarin for systemic anticoagulation.  3.  Coronary artery disease: Cardiac catheterization in January 2021 showed nonobstructive disease.  He remains on a beta-blocker and statin.  4.  Hypertension: Blood pressure is normal.  No changes to therapy.  5. LBBB: QRS duration 170 ms by ECG on 09/17/2019.  I will make a referral to EP for consideration of CRT-D.    Disposition: Follow up 6 months   Kate Sable, M.D., F.A.C.C.

## 2020-01-12 NOTE — Patient Instructions (Signed)
Medication Instructions:  Your physician recommends that you continue on your current medications as directed. Please refer to the Current Medication list given to you today.  *If you need a refill on your cardiac medications before your next appointment, please call your pharmacy*   Lab Work: None today If you have labs (blood work) drawn today and your tests are completely normal, you will receive your results only by: Marland Kitchen MyChart Message (if you have MyChart) OR . A paper copy in the mail If you have any lab test that is abnormal or we need to change your treatment, we will call you to review the results.   Testing/Procedures: None today   Follow-Up: At [facility], you and your health needs are our priority.  As part of our continuing mission to provide you with exceptional heart care, we have created designated Provider Care Teams.  These Care Teams include your primary Cardiologist (physician) and Advanced Practice Providers (APPs -  Physician Assistants and Nurse Practitioners) who all work together to provide you with the care you need, when you need it.  We recommend signing up for the patient portal called "MyChart".  Sign up information is provided on this After Visit Summary.  MyChart is used to connect with patients for Virtual Visits (Telemedicine).  Patients are able to view lab/test results, encounter notes, upcoming appointments, etc.  Non-urgent messages can be sent to your provider as well.   To learn more about what you can do with MyChart, go to [email or web address].    Your next appointment:   6 month(s)  The format for your next appointment:   In Person  Provider:   Kate Sable, MD or Bernerd Pho, PA-C   Other Instructions None      Thank you for choosing [facility] !

## 2020-01-15 ENCOUNTER — Ambulatory Visit (INDEPENDENT_AMBULATORY_CARE_PROVIDER_SITE_OTHER): Payer: Medicare HMO | Admitting: Podiatry

## 2020-01-15 ENCOUNTER — Other Ambulatory Visit: Payer: Self-pay

## 2020-01-15 DIAGNOSIS — E0843 Diabetes mellitus due to underlying condition with diabetic autonomic (poly)neuropathy: Secondary | ICD-10-CM

## 2020-01-15 DIAGNOSIS — Z9889 Other specified postprocedural states: Secondary | ICD-10-CM

## 2020-01-16 ENCOUNTER — Encounter (HOSPITAL_BASED_OUTPATIENT_CLINIC_OR_DEPARTMENT_OTHER): Payer: Medicare HMO | Admitting: Internal Medicine

## 2020-01-16 DIAGNOSIS — L97511 Non-pressure chronic ulcer of other part of right foot limited to breakdown of skin: Secondary | ICD-10-CM | POA: Diagnosis not present

## 2020-01-16 DIAGNOSIS — L97221 Non-pressure chronic ulcer of left calf limited to breakdown of skin: Secondary | ICD-10-CM | POA: Diagnosis not present

## 2020-01-16 DIAGNOSIS — I87333 Chronic venous hypertension (idiopathic) with ulcer and inflammation of bilateral lower extremity: Secondary | ICD-10-CM | POA: Diagnosis not present

## 2020-01-16 DIAGNOSIS — E1151 Type 2 diabetes mellitus with diabetic peripheral angiopathy without gangrene: Secondary | ICD-10-CM | POA: Diagnosis not present

## 2020-01-16 DIAGNOSIS — L97211 Non-pressure chronic ulcer of right calf limited to breakdown of skin: Secondary | ICD-10-CM | POA: Diagnosis not present

## 2020-01-16 DIAGNOSIS — E1142 Type 2 diabetes mellitus with diabetic polyneuropathy: Secondary | ICD-10-CM | POA: Diagnosis not present

## 2020-01-16 DIAGNOSIS — L97524 Non-pressure chronic ulcer of other part of left foot with necrosis of bone: Secondary | ICD-10-CM | POA: Diagnosis not present

## 2020-01-16 DIAGNOSIS — M86672 Other chronic osteomyelitis, left ankle and foot: Secondary | ICD-10-CM | POA: Diagnosis not present

## 2020-01-16 DIAGNOSIS — E11622 Type 2 diabetes mellitus with other skin ulcer: Secondary | ICD-10-CM | POA: Diagnosis not present

## 2020-01-16 NOTE — Progress Notes (Signed)
[name] ([id number]) Visit Report for 01/16/2020 HPI Details Patient Name: Date of Service: [name], [name] 01/16/2020 8:00 AM Medical Record [facility] Patient Account Number: [id number] Date of Birth/Sex: Treating RN: [id number]-Apr-1941 (78 y.o. [name]) [name] Primary Care Provider: Jeralyn Gross Other Clinician: Referring Provider: Treating Provider/Extender:[name], [name], [name] Weeks in Treatment: 40 History of Present Illness Location: Patient presents with a wound to left lower leg. Quality: Patient reports No Pain. Duration: 2 months HPI Description: no cig or alcohol. spontaneous appearance in area of stasis dermamtitis. [name] on metformin only. chronic afib on Coumadin. diabetes and coag studies not good. hba1c 7.5. ivr 4.5. no pain or sxs of systemic disease. hx chf. no intermittent claudication 02/28/2019 Readmission This is a now a 79 year old man who was previously cared for in 2016 by [name] for wounds on his lower extremities. At that point he had venous reflux studies although I cannot seem to open these in [place] link. He had arterial studies showing an ABI of 1.11 on the right and 1.27 on the left his waveforms were triphasic bilaterally. He was discharged in stockings although I do not believe he is wearing these in some time. He tells me that about a month ago he noted openings of a large wound on the posterior right calf and 2 smaller areas on the left lateral calf and a small area more recently on the left posterior calf. He has been dressing these with peroxide and triple antibiotic ointment. He is not wearing compression. Past medical history; type 2 diabetes with peripheral neuropathy, chronic venous insufficiency, hypertension, cardiomyopathy, chronic atrial fibrillation on Coumadin, prostate cancer, hyperlipidemia, gout, ABI in our clinic was 1.34 on the left and not obtainable on the right 6/9; this is a patient who has chronic venous  insufficiency. He has a fairly substantial area on the right posterior calf, left lateral calf and a small area on the left posterior calf. On arrival last week he had very palpable popliteal and femoral pulses but nothing in his bilateral feet. Unfortunately we cannot get arterial studies until July 1 at [name] office. They live in [place]. We use silver alginate under Kerlix Coban 6/16; patient with chronic venous insufficiency with wounds on his bilateral lower extremities. When he came into our clinic he was discovered to have a complete absence of peripheral pedal pulses at either the dorsalis pedis or posterior tibial. He does have easily palpable femoral and popliteal pulses. He sees [name] tomorrow for noninvasive arterial tests. He may also require venous reflux evaluation although I do not view this as an urgent thing. We have been using silver alginate. His wound surfaces of cleaned up quite nicely 6/23; patient with chronic venous insufficiency with wounds on his bilateral lower extremities. His wounds all are somewhat better looking. He did go to [name] office but somehow ended up on the doctors schedule rather than being scheduled for noninvasive tests therefore his noninvasive tests are scheduled for July 1. We agree that he has venous insufficiency ulcers but I cannot feel any pulses in his lower extremities dictating the need for test. We are only using Kerlix and light Coban unfortunately this appears to be holding the edema 6/30; has his arterial studies tomorrow. We have been using Kerlix and light Coban will go to a more aggressive compression if the arterial studies will allow. We all agreed these are venous wounds however I cannot feel pulses at either the dorsalis pedis or posterior tibial  bilaterally. His wounds generally look some better including left lateral and right posterior. 7/7-Patient returns at 1 week in Kerlix/Coban to both legs, with improvement,  in the left lateral and right posterior lower leg wounds, ABI's are normal in both legs per vascular studies, TBI is also normal on both sides, we are using hydrofera blue to the wounds 7/14; patient's arterial studies from 2 weeks ago showed an ABI on the right at 1.03 with a TBI of 0.86. On the left the ABI was 1.06 with a TBI of 0.84. Notable for the fact that his arterial waveforms were monophasic in all of the lower extremity arteries suggesting some degree of arterial occlusive disease but in general this was felt to be fairly adequate for healing. His compression was increased from 2-3 layer which is appropriate. Dressing was changed to Hydrofera Blue 7/21; patient's wounds are measuring smaller. The more substantial one on the right posterior calf, second 1 on the left lateral calf. Using Hydrofera Blue on both wound areas 7/28; patient continues to make nice improvements. The area on the right posterior calf is smaller. Area on the left lateral calf also is smaller. We have been using Hydrofera Blue under compression. The patient will need compression stockings and we have measured him for these in the eventuality that these heal which really should not be too long from now 8/4-Patient continues to make improvement, the right posterior calf area smaller with rim of keratotic skin on one side, the left wound is definitely smaller and improving. 8/11-Returns at 1 week, after being in 3 layer compression on both legs, both wounds appear to be improving, making good progress, patient is happy, pain is also less especially in the right leg wound 8/18; the area on the left anterior lower leg is healed. On the right posterior leg the wound remains although the dimensions are a lot better. 8/25; he arrives in clinic today with a large body of open wound on the left lateral calf. All of the 3 wounds in this area are in close juxtaposition to each other. The story is that we discharged him last  week with no a wrap on the left leg. They went to [facility] could not get in as they are only excepting phone orders or online orders for stockings hence they did not put any stocking on the left leg all week. They have something at home but the patient with that was either incapable or just did not put them on. Apparently these opened 1 morning after getting out of bed. The area on the right has no real change 9/1; patient has bilateral lower extremity wounds in the setting of severe chronic venous insufficiency and secondary lymphedema. He arrived last week with new areas on the left lateral lower leg after we did not wrap him and he did not use his stockings. Nevertheless the areas on the left look better today under compression. Posterior right calf does not really changed. We are using Hydrofera Blue on both areas under compression 9/15; bilateral lower extremity wounds in the setting of severe chronic venous insufficiency and secondary lymphedema. He has 20 to 30 mmHg below-knee compression stockings under the eventuality that these close over. We did get the left leg to close but he did not transition to a stocking and this reopened. There are 2 open areas on the left posterior lateral calf and one on the right. Both of these look satisfactory. Using Hydrofera Blue 9/22; bilateral lower extremity wounds in the   setting of chronic venous insufficiency. 2 superficial areas on the left lateral calf. One on the right just above the Achilles area. We have good edema control we have been using Hydrofera Blue 9/29; the areas on the left lateral calf are healed. On the right just above the Achilles and tendon area things look a lot better small wounds one scabbed area. We have been using Hydrofera Blue. We can discharge him in his own stocking on the left still wrapping on the right. This is the second time we have healed the left leg but he did not put a stocking on last time. Hopefully this will  maintain the edema from chronic venous disease with secondary lymphedema 10/6; he comes in today having a stocking on the left leg. They had trouble getting it on there is a lot of increase in swelling 2 small open areas one anteriorly and one on the medial calf. They report a lot of difficulty getting the stocking on. Paradoxically the area on the right that we have been wrapping posteriorly is closed 10/13; he comes in today with wounds bilaterally including superficial areas on the left medial and left lateral calf. As well as the right posterior has reopened in the Achilles area superiorly. He still does not have his juxta lite stockings although truthfully we would not of been able to use them today anyway. Apparently have been ordered and paid for from [employer] although they have not been delivered 10/20; his area on the right is just the boat closed on the right posterior. Still has the area on the left lateral and a very tiny area on the left medial. He has his bilateral juxta lites although he is not ready for them this week. He tolerated the increase to 4 layer compression last week quite well 10/27; the area on the right posterior calf is once again closed. He has a superficial area on the left lateral calf that is still open. He has been using Hydrofera Blue and bilateral 4-layer compression. He can change to his own juxta lite stocking on the right and we are instructing him today 11/3; the area on the right posterior calf reopened according to the patient and his wife after they took off the stocking when they got home last week. Apparently scabbed over there is now a fairly substantial wound which looks pretty much the same. Our intake nurse noted that they were using the juxta lite stockings appropriately. I was really hoping I might be able to close him out today. He has 1 very tiny remaining area on the left lateral lower leg. 11/10; right posterior calf wound measures smaller but  is still open. We have been using Hydrofera Blue. On the left he has a small Daniel-shaped wound and he seems to have had another wound distally that is open and likely a blister. We are using Hydrofera Blue under compression 11/17; right posterior calf wound continues to get better. We have been using Hydrofera Blue. On the left lateral one of the wounds has closed still a small open area. We have been using Hydrofera Blue on this as well. Both areas have been under 4-layer compression Arrives in clinic today with some swelling in the dorsal foot on the right some erythema of his forefoot and toes. Initially when I looked at this I almost thought this was a sunburn distal to a wrap injury. 12/1; right posterior calf wound debrided with a curette. We have been using Hydrofera Blue on the   left anterior lateral he has an area across the mid tibia. Finally a small area on the left lateral lower calf. Finally he continues to have de-epithelialized areas on the dorsal aspect of his toes. Initially thought this might be a burn injury when I saw him 2 weeks ago. I now wonder about tinea. I have also reviewed his arterial studies which were really quite good in July/20 with normal TBI's and ABIs but monophasic waveforms 12/8; comes in today with worsening problems especially on the left leg where he now has a cluster of wounds in the left anterior mid tibia. Very poor edema control. I reduced him to 3 layer from 4 layer compression last week because of some concern about blood flow to his toes however he does not have good edema control on the left leg. Right leg edema control looks satisfactory. On the left he has a cluster of wounds anteriorly, small area on the left medial fifth met head and then the collection of areas on his toes which appear better On the right he has the original area on the right posterior calf, a new area right medially. His formal arterial studies from mid July are noted below.  He was evaluated by [facility] ABI Findings: +---------+------------------+-----+----------+--------+ Right Rt Pressure (mmHg)IndexWaveform Comment  +---------+------------------+-----+----------+--------+ Brachial 176     +---------+------------------+-----+----------+--------+ ATA 176 0.99 monophasic  +---------+------------------+-----+----------+--------+ PTA 183 1.03 monophasic  +---------+------------------+-----+----------+--------+ PERO 172 0.97 monophasic  +---------+------------------+-----+----------+--------+ Great Toe153 0.86    +---------+------------------+-----+----------+--------+ +---------+------------------+-----+-----------+-------+ Left Lt Pressure (mmHg)IndexWaveform Comment +---------+------------------+-----+-----------+-------+ Brachial 178     +---------+------------------+-----+-----------+-------+ ATA 162 0.91 multiphasic  +---------+------------------+-----+-----------+-------+ PTA 188 1.06 multiphasic  +---------+------------------+-----+-----------+-------+ PERO 158 0.89 monophasic   +---------+------------------+-----+-----------+-------+ Great Toe150 0.84    +---------+------------------+-----+-----------+-------+ +-------+-----------+-----------+------------+------------+ ABI/TBIToday's ABIToday's TBIPrevious ABIPrevious TBI +-------+-----------+-----------+------------+------------+ Right 1.03 0.86 1.11   +-------+-----------+-----------+------------+------------+ Left 1.06 0.84 1.27   +-------+-----------+-----------+------------+------------+ Tibial waveforms somewhat difficult to record due to irregular heartbeat. Bilateral ABIs appear essentially unchanged compared to prior study on 06/21/15. Summary: Right: Resting right ankle-brachial index is within normal range. No evidence of significant right lower extremity arterial disease. The right  toe-brachial index is normal. Although ankle brachial indices are within normal limits (0.95-1.29), arterial Doppler waveforms at the ankle suggest some component of arterial occlusive disease. Left: Resting left ankle-brachial index is within normal range. No evidence of significant left lower extremity arterial disease. The left toe-brachial index is normal. Although ankle brachial indices are within normal limits (0.95-1.29), arterial Doppler waveforms at the ankle suggest some component of arterial occlusive disease. 12/15; the patient's area on the left mid tibia looks better. Right posterior calf also better. He has the area on the left foot as well. All of his toes look better I think this was tinea. We are using Hydrofera Blue everywhere else The patient was in urgent care yesterday with wheezing and shortness of breath. He got azithromycin and prednisone. He feels better. His lungs are currently clear to auscultation. He was not tested for Covid 19 12/29; the patient arrives in clinic today with quite a bit change. 2 weeks ago he only had areas on the left mid tibia right posterior calf with tinea pedis resolving between his toes. He arrives in clinic today with several areas on the dorsal toes on the right, dorsal left second toe. He has skin breakdown in the left medial calf probably from excess edema. Small area proximally in the medial calf. He has weeping edema fluid coming out of the skin excoriations on the left medial calf. He tells  me that he is having a cardiac catheterization next week. I had a quick look at [facility] link. He was [facility] to have an ejection fraction of 25% during the work-up for persistent atrial fibrillation. He saw his cardiology office yesterday seen by the nurse practitioner. She increased his carvedilol. He has not been on diuretics for apparently several months and indeed in the nurse practitioner Laura Ingolds notes she had knowledge of this. 10/04/2019  on evaluation today patient presents as a walk-in visit concerning the fact that he did not have an appointment here for our clinic at this point. He actually had a cardiac catheterization earlier today and then came from there to here in order to be evaluated. With that being said unfortunately he is having significant cellulitis of his left lower extremity upon evaluation today this appears to have deteriorated even since last week's evaluation with Dr. Brixton Schnapp. The right lower extremity is actually doing okay I really see no evidence of deterioration at this point at those locations. In fact the right leg seems in general be doing quite well. Nonetheless I am concerned about infection and cellulitis of the left lower extremity and again considering his weakened heart I do not want him to develop into sepsis at all. He is also having some trouble breathing today and I understand according to nursing staff this is always the case to some degree. With that being said the patient unfortunately seems to be in my opinion a little bit worse even his wife feels like that may be the case today. Unsure exactly what is leading to this. Cardiac catheterization I did review the report which showed an ejection fraction of 25% he also had an LAD blockage of around 25% based on what I saw. With that being said there was no significant blockages that required stenting at this point he does have weakened cardiac muscles compared to normal. 10/05/2019; patient was seen yesterday in clinic. He was sent to the ER because of cellulitis of the left leg possibility. In the ER he was given 1 dose of IV Levaquin and discharged on Keflex. He came in the clinic initially for a nurse visit to rewrap his left leg. We did not look at the right leg today that is an Unna boot. The patient also had a cardiac cath. According to him there were no blockages but a very low ejection fraction. 1/12; back for an early follow-up. The  condition of the left lower leg is a lot better although there are multiple open areas. All of them with not a particularly viable surface. On the right posterior calf he has a single wound with a clean surface. He has a wound with exposed bone on the PIP of the left second toe dorsally. He has wounds on the dorsal right first second and third toes. His arterial studies were normal. 1/19; the patient has 3 open wounds on the left leg anteriorly in the mid tibia, distally and medially just above the ankle and posteriorly. On the right he has a small area dime sized on the right posterior calf. His edema control is a lot better. He still has wounds on the right second and left second toes. The left second toe has exposed bone. X- ray I did last week did not show evidence of osteomyelitis in the left foot. 1/26; patient with a multiplicity of wounds and problems. On the left he has a circular area on the left anterior mid tibia Left just above   the medical ankle -left 2nd toe pip -right 2nd toe right posterior calf 2/2; patient with a multiplicity of wounds and problems. He has severe chronic venous insufficiency and the wounds on his leg are all on the left left anterior left medial at the medial malleolus and left posterior. We have been using Iodoflex to these areas to help with ongoing debridement. He also has largely traumatic wounds on his toes this includes the left second with exposed bone. Bone culture I did last week showed Staph aureus which is methicillin sensitive. I discussed with him today the idea of an amputation of this toe because it is literally nonfunctional however he wants to try antibiotics. Antibiotic choice is complicated by the fact that he is on Coumadin. He also has wounds over the dorsal part of the right first which is close to closed. Right second toe has exposed bone and the right third toe at the base of the right third toe is just about closed as well. We have  been using silver alginate 2/9; patient with severe chronic venous insufficiency. He has large wounds on the left anterior mid tibia and on the left medial just above the ankle. I am concerned today about the depth of the area on the left mid tibia may be exposing into the muscle. He has small areas on the left posterior calf and on the right posterior calf although these do not look as threatening. He also has traumatic wounds on his toes. The right first and third are closed however the bilateral second toes have exposed bone. The area on the left is open into the distal interphalangeal joint. In my opinion these toes are nonsalvageable and will need amputation 2/16; patient with severe chronic venous insufficiency. He has wounds on the left anterior mid tibia left medial lower leg just above the ankle. He has small areas on the left posterior calf and a more prominent area on the right posterior calf. All of these are related to poorly controlled venous hypertension. He also had traumatic wounds on the PIPs of both second toes. Unfortunately both of these have exposed bone and in the case of the left this I think goes right into the joint itself. I do not think either 1 of these is salvageable. I have reviewed his arterial evaluation that was done in July last year. He also saw Dr. Berry in June. He felt these were venous stasis. Previously had segmental arterial pressures performed in 11/03/2014 which were normal. He was sent for for a follow-up arterial evaluation on July 1. On the right his ABIs were 1.03 with a TBI of 0.86 although his waveforms were monophasic on the left he had multiphasic waveforms at the ATA PTA but monophasic at the peroneal nevertheless his ABI was normal at 1.06 TBI also normal at 0.84. I think he has enough blood flow to heal an amputation which I think he needs of the left second toe and probably the right second toe as well 2/23; the patient is going for his  bilateral second toe amputation by Dr. Evans on Friday. In the meantime is 3 areas on the left leg and the small area on the right posterior look better. We have been using silver collagen. 3/2; the patient's toe amputations were canceled because of cardiac concerns. Apparently this surgery will need to be done in the [facility] although they do not have an appointment. In the meantime he is continues to have 3 areas on the left leg   one anteriorly and 2 smaller areas posteriorly on the left as well as the small posterior area on the right. We have been using silver collagen with compression 3/9; still no word on the second toe amputations bilaterally. 3 wounds on the left leg all look better and the area on the right posterior we have been using silver collagen I change this to [facility] 3/16; amputations next Monday both second toes he is apparently going to be hospitalized. He has on his left leg including left anterior and left lateral look better very superficial also the right posterior. 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 He continues to have left leg wounds anteriorly x2 posteriorly x1 at the left lower leg just above the ankle anteriorly x1 and posteriorly on the right x1 3/30; his anterior left leg wounds are considerably smaller. In fact the only wound that is not smaller is on the right. We have been using Hydrofera Blue under Kerlix Cobax 4/6; his anterior left leg wounds continue to contract in fact the area distally and medially has closed over. He still has the area anteriorly on the left. Both posterior calf wounds on the left and right are much smaller. We saw his surgical wound at the left second toe amputation site that still sutured it looks healthy, this is being followed by his podiatrist. We went over what compression he has he has bilateral juxta lites I am  hopeful the issue here was that we just did not have them tight enough because he broke down very quickly the last time we healed him out. 4/13; the patient has 3 remaining wounds one small area on the left anterior lower leg one on the left posterior and one on the right anterior. We have been using Hydrofera Blue under compression compression all of these are contracting. We did discuss life in the eventuality he will totally closed which seems likely in the next week or 2. He has bilateral juxta lite stockings. These will need to be set at 30-40 compression. Since we last closed him over for his bilateral lower extremity wounds he is also been treated for heart failure which may help. He undoubtedly has severe chronic venous hypertension however as well 4/19; the area on the left anterior tibial area is closed. The area on the right posterior calf is just above closed. The area on the left posterior calf has some depth. 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 Everything looks quite good here. He does not need a specific dressing Electronic Signature(s) Signed: 01/16/2020 5:47:38 PM By: [name] MD Entered By: [name] on 01/16/2020 09:26:32 -------------------------------------------------------------------------------- Physical Exam Details Patient Name: Date of Service: TRAYLON, SCHIMMING 01/16/2020 8:00 AM Medical Record [id number] Patient Account Number: [id number] Date of Birth/Sex: Treating RN: [birth year] (78 y.o. [name] Primary Care Provider: Jeralyn Gross Other Clinician: Referring Provider: Treating Provider/Extender:[place] Jon, [name], [place] [facility] Weeks in Treatment: 70 Constitutional Sitting or standing Blood Pressure is within target range for patient.. Pulse regular and within target  range for patient.Marland Kitchen Respirations regular, non-labored and within target range.. Temperature is normal and within the target range for the patient.Marland Kitchen Appears in no distress. Respiratory work of breathing is normal. Cardiovascular Edema  control is excellent. Integumentary (Hair, Skin) changes severe chronic venous insufficiency. Notes Wound exam; all of his wounds are improved. The area on the left anterior is healed. Right posterior should be healed by next week only a small superficial area remaining here. On the left posterior calf still a small wound but with some depth. Electronic Signature(s) Signed: 01/16/2020 5:47:38 PM By: [name] MD Entered By: [name] on 01/16/2020 09:27:48 -------------------------------------------------------------------------------- Physician Orders Details Patient Name: Date of Service: Daniel Pia D. 01/16/2020 8:00 AM Medical Record [id number] Patient Account Number: [id number] Date of Birth/Sex: Treating RN: 01/03/1941 (78 y.o. [name]) [name] Primary Care Provider: Jeralyn Gross Other Clinician: Referring Provider: Treating Provider/Extender:Kalis Friese, [name], [place] [facility] Weeks in Treatment: 36 Verbal / Phone Orders: No Diagnosis Coding ICD-10 Coding Code Description L97.211 Non-pressure chronic ulcer of right calf limited to breakdown of skin L97.221 Non-pressure chronic ulcer of left calf limited to breakdown of skin I87.333 Chronic venous hypertension (idiopathic) with ulcer and inflammation of bilateral lower extremity E11.51 Type 2 diabetes mellitus with diabetic peripheral angiopathy without gangrene E11.42 Type 2 diabetes mellitus with diabetic polyneuropathy L97.524 Non-pressure chronic ulcer of other part of left foot with necrosis of bone L97.511 Non-pressure chronic ulcer of other part of right foot limited to breakdown of skin R00.76 Chronic systolic (congestive) heart failure A26.333 Other chronic osteomyelitis,  left ankle and foot Follow-up Appointments Return Appointment in 1 week. - Tuesday **********EXTRA TIME**************** Dressing Change Frequency Wound #25 Left,Distal,Posterior Lower Leg Do not change entire dressing for one week. Wound #3R Right,Posterior Calf Do not change entire dressing for one week. Skin Barriers/Peri-Wound Care TCA Cream or Ointment Wound Cleansing May shower with protection. Primary Wound Dressing Wound #25 Left,Distal,Posterior Lower Leg Silver Collagen Wound #3R Right,Posterior Calf Silver Collagen Secondary Dressing Dry Gauze - secure toes with tape Edema Control Wound #25 Left,Distal,Posterior Lower Leg Kerlix and Coban - Bilateral Wound #3R Right,Posterior Calf Kerlix and Coban - Bilateral Avoid standing for long periods of time Elevate legs to the level of the heart or above for 30 minutes daily and/or when sitting, a frequency of: - throughout the day. Support Garment 30-40 mm/Hg pressure to: - patient to bring juxtalites stockings into wound center weekly. Off-Loading Open toe surgical shoe to: - left and right foot Electronic Signature(s) Signed: 01/16/2020 5:39:56 PM By: Daniel Coria RN Signed: 01/16/2020 5:47:38 PM By: [name] MD Entered By: [name] on 01/16/2020 09:02:59 -------------------------------------------------------------------------------- Problem List Details Patient Name: Date of Service: Daniel Pia D. 01/16/2020 8:00 AM Medical Record [id number] Patient Account Number: [id number] Date of Birth/Sex: Treating RN: Jun 03, 1941 (78 y.o. [name]) Dolores Lory, [place] Primary Care Provider: Jeralyn Gross Other Clinician: Referring Provider: Treating Provider/Extender:Elodia Haviland, [name], [facility] Weeks in Treatment: 59 Active Problems ICD-10 Evaluated Encounter Code Description Active Date Today Diagnosis L97.211 Non-pressure chronic ulcer of right calf limited to 02/28/2019 No Yes breakdown of skin L97.221 Non-pressure  chronic ulcer of left calf limited to 02/28/2019 No Yes breakdown of skin I87.333 Chronic venous hypertension (idiopathic) with ulcer 02/28/2019 No Yes and inflammation of bilateral lower extremity E11.51 Type 2 diabetes mellitus with diabetic peripheral 02/28/2019 No Yes angiopathy without gangrene E11.42 Type 2 diabetes mellitus with diabetic polyneuropathy 02/28/2019 No Yes L97.524 Non-pressure chronic ulcer of other part of left foot 10/10/2019 No Yes with necrosis of bone L97.511 Non-pressure chronic ulcer of other part of right foot 09/26/2019 No Yes limited to breakdown of skin A76.81 Chronic systolic (congestive) heart failure 10/05/2019 No Yes M86.672 Other chronic osteomyelitis, left ankle  and foot 10/31/2019 No Yes Inactive Problems ICD-10 Code Description Active Date Inactive Date B35.3 Tinea pedis 09/05/2019 09/05/2019 L97.521 Non-pressure chronic ulcer of other part of left foot limited to 09/26/2019 09/26/2019 breakdown of skin Resolved Problems Electronic Signature(s) Signed: 01/16/2020 5:47:38 PM By: [name] MD Entered By: [name] on 01/16/2020 09:24:47 -------------------------------------------------------------------------------- Progress Note Details Patient Name: Date of Service: Daniel Pia D. 01/16/2020 8:00 AM Medical Record [id number] Patient Account Number: [id number] Date of Birth/Sex: Treating RN: 1941-02-04 (78 y.o. [name] Primary Care Provider: Jeralyn Gross Other Clinician: Referring Provider: Treating Provider/Extender:Estalene Bergey, [name], [facility] Weeks in Treatment: 46 Subjective History of Present Illness (HPI) The following HPI elements were documented for the patient's wound: Location: Patient presents with a wound to left lower leg. Quality: Patient reports No Pain. Duration: 2 months no cig or alcohol. spontaneous appearance in area of stasis dermamtitis. [name] on metformin only. chronic afib on Coumadin. diabetes and coag  studies not good. hba1c 7.5. ivr 4.5. no pain or sxs of systemic disease. hx chf. no intermittent claudication 02/28/2019 Readmission This is a now a 79 year old man who was previously cared for in 2016 by [name] for wounds on his lower extremities. At that point he had venous reflux studies although I cannot seem to open these in [place] link. He had arterial studies showing an ABI of 1.11 on the right and 1.27 on the left his waveforms were triphasic bilaterally. He was discharged in stockings although I do not believe he is wearing these in some time. He tells me that about a month ago he noted openings of a large wound on the posterior right calf and 2 smaller areas on the left lateral calf and a small area more recently on the left posterior calf. He has been dressing these with peroxide and triple antibiotic ointment. He is not wearing compression. Past medical history; type 2 diabetes with peripheral neuropathy, chronic venous insufficiency, hypertension, cardiomyopathy, chronic atrial fibrillation on Coumadin, prostate cancer, hyperlipidemia, gout, ABI in our clinic was 1.34 on the left and not obtainable on the right 6/9; this is a patient who has chronic venous insufficiency. He has a fairly substantial area on the right posterior calf, left lateral calf and a small area on the left posterior calf. On arrival last week he had very palpable popliteal and femoral pulses but nothing in his bilateral feet. Unfortunately we cannot get arterial studies until July 1 at [name] office. They live in [place]. We use silver alginate under Kerlix Coban 6/16; patient with chronic venous insufficiency with wounds on his bilateral lower extremities. When he came into our clinic he was discovered to have a complete absence of peripheral pedal pulses at either the dorsalis pedis or posterior tibial. He does have easily palpable femoral and popliteal pulses. He sees [name] tomorrow  for noninvasive arterial tests. He may also require venous reflux evaluation although I do not view this as an urgent thing. We have been using silver alginate. His wound surfaces of cleaned up quite nicely 6/23; patient with chronic venous insufficiency with wounds on his bilateral lower extremities. His wounds all are somewhat better looking. He did go to [name] office but somehow ended up on the doctors schedule rather than being scheduled for noninvasive tests therefore his noninvasive tests are scheduled for July 1. We agree that he has venous insufficiency ulcers but I cannot feel any pulses in his lower extremities dictating the need for test. We are only  using Kerlix and light Coban unfortunately this appears to be holding the edema 6/30; has his arterial studies tomorrow. We have been using Kerlix and light Coban will go to a more aggressive compression if the arterial studies will allow. We all agreed these are venous wounds however I cannot feel pulses at either the dorsalis pedis or posterior tibial bilaterally. His wounds generally look some better including left lateral and right posterior. 7/7-Patient returns at 1 week in Kerlix/Coban to both legs, with improvement, in the left lateral and right posterior lower leg wounds, ABI's are normal in both legs per vascular studies, TBI is also normal on both sides, we are using hydrofera blue to the wounds 7/14; patient's arterial studies from 2 weeks ago showed an ABI on the right at 1.03 with a TBI of 0.86. On the left the ABI was 1.06 with a TBI of 0.84. Notable for the fact that his arterial waveforms were monophasic in all of the lower extremity arteries suggesting some degree of arterial occlusive disease but in general this was felt to be fairly adequate for healing. His compression was increased from 2-3 layer which is appropriate. Dressing was changed to [facility] 7/21; patient's wounds are measuring smaller. The more  substantial one on the right posterior calf, second 1 on the left lateral calf. Using [facility] on both wound areas 7/28; patient continues to make nice improvements. The area on the right posterior calf is smaller. Area on the left lateral calf also is smaller. We have been using Hydrofera Blue under compression. The patient will need compression stockings and we have measured him for these in the eventuality that these heal which really should not be too long from now 8/4-Patient continues to make improvement, the right posterior calf area smaller with rim of keratotic skin on one side, the left wound is definitely smaller and improving. 8/11-Returns at 1 week, after being in 3 layer compression on both legs, both wounds appear to be improving, making good progress, patient is happy, pain is also less especially in the right leg wound 8/18; the area on the left anterior lower leg is healed. On the right posterior leg the wound remains although the dimensions are a lot better. 8/25; he arrives in clinic today with a large body of open wound on the left lateral calf. All of the 3 wounds in this area are in close juxtaposition to each other. The story is that we discharged him last week with no a wrap on the left leg. They went to [facility] could not get in as they are only excepting phone orders or online orders for stockings hence they did not put any stocking on the left leg all week. They have something at home but the patient with that was either incapable or just did not put them on. Apparently these opened 1 morning after getting out of bed. The area on the right has no real change 9/1; patient has bilateral lower extremity wounds in the setting of severe chronic venous insufficiency and secondary lymphedema. He arrived last week with new areas on the left lateral lower leg after we did not wrap him and he did not use his stockings. Nevertheless the areas on the left look better today  under compression. Posterior right calf does not really changed. We are using Hydrofera Blue on both areas under compression 9/15; bilateral lower extremity wounds in the setting of severe chronic venous insufficiency and secondary lymphedema. He has 20 to 30 mmHg  below-knee compression stockings under the eventuality that these close over. We did get the left leg to close but he did not transition to a stocking and this reopened. There are 2 open areas on the left posterior lateral calf and one on the right. Both of these look satisfactory. Using [facility] 9/22; bilateral lower extremity wounds in the setting of chronic venous insufficiency. 2 superficial areas on the left lateral calf. One on the right just above the Achilles area. We have good edema control we have been using Hydrofera Blue 9/29; the areas on the left lateral calf are healed. On the right just above the Achilles and tendon area things look a lot better small wounds one scabbed area. We have been using Hydrofera Blue. We can discharge him in his own stocking on the left still wrapping on the right. This is the second time we have healed the left leg but he did not put a stocking on last time. Hopefully this will maintain the edema from chronic venous disease with secondary lymphedema 10/6; he comes in today having a stocking on the left leg. They had trouble getting it on there is a lot of increase in swelling 2 small open areas one anteriorly and one on the medial calf. They report a lot of difficulty getting the stocking on. ooParadoxically the area on the right that we have been wrapping posteriorly is closed 10/13; he comes in today with wounds bilaterally including superficial areas on the left medial and left lateral calf. As well as the right posterior has reopened in the Achilles area superiorly. He still does not have his juxta lite stockings although truthfully we would not of been able to use them today anyway.  Apparently have been ordered and paid for from [facility] although they have not been delivered 10/20; his area on the right is just the boat closed on the right posterior. Still has the area on the left lateral and a very tiny area on the left medial. He has his bilateral juxta lites although he is not ready for them this week. He tolerated the increase to 4 layer compression last week quite well 10/27; the area on the right posterior calf is once again closed. He has a superficial area on the left lateral calf that is still open. He has been using Hydrofera Blue and bilateral 4-layer compression. He can change to his own juxta lite stocking on the right and we are instructing him today 11/3; the area on the right posterior calf reopened according to the patient and his wife after they took off the stocking when they got home last week. Apparently scabbed over there is now a fairly substantial wound which looks pretty much the same. Our intake nurse noted that they were using the juxta lite stockings appropriately. I was really hoping I might be able to close him out today. He has 1 very tiny remaining area on the left lateral lower leg. 11/10; right posterior calf wound measures smaller but is still open. We have been using Hydrofera Blue. On the left he has a small Daniel-shaped wound and he seems to have had another wound distally that is open and likely a blister. We are using Hydrofera Blue under compression 11/17; right posterior calf wound continues to get better. We have been using Hydrofera Blue. On the left lateral one of the wounds has closed still a small open area. We have been using Hydrofera Blue on this as well. Both areas have been  under 4-layer compression Arrives in clinic today with some swelling in the dorsal foot on the right some erythema of his forefoot and toes. Initially when I looked at this I almost thought this was a sunburn distal to a wrap injury. 12/1; right posterior  calf wound debrided with a curette. We have been using Hydrofera Blue on the left anterior lateral he has an area across the mid tibia. Finally a small area on the left lateral lower calf. Finally he continues to have de-epithelialized areas on the dorsal aspect of his toes. Initially thought this might be a burn injury when I saw him 2 weeks ago. I now wonder about tinea. I have also reviewed his arterial studies which were really quite good in July/20 with normal TBI's and ABIs but monophasic waveforms 12/8; comes in today with worsening problems especially on the left leg where he now has a cluster of wounds in the left anterior mid tibia. Very poor edema control. I reduced him to 3 layer from 4 layer compression last week because of some concern about blood flow to his toes however he does not have good edema control on the left leg. Right leg edema control looks satisfactory. ooOn the left he has a cluster of wounds anteriorly, small area on the left medial fifth met head and then the collection of areas on his toes which appear better ooOn the right he has the original area on the right posterior calf, a new area right medially. His formal arterial studies from mid July are noted below. He was evaluated by [facility] ABI Findings: +---------+------------------+-----+----------+--------+ Right Rt Pressure (mmHg)IndexWaveform Comment  +---------+------------------+-----+----------+--------+ Brachial 176     +---------+------------------+-----+----------+--------+ ATA 176 0.99 monophasic  +---------+------------------+-----+----------+--------+ PTA 183 1.03 monophasic  +---------+------------------+-----+----------+--------+ PERO 172 0.97 monophasic  +---------+------------------+-----+----------+--------+ Great Toe153 0.86    +---------+------------------+-----+----------+--------+ +---------+------------------+-----+-----------+-------+ Left Lt  Pressure (mmHg)IndexWaveform Comment +---------+------------------+-----+-----------+-------+ Brachial 178     +---------+------------------+-----+-----------+-------+ ATA 162 0.91 multiphasic  +---------+------------------+-----+-----------+-------+ PTA 188 1.06 multiphasic  +---------+------------------+-----+-----------+-------+ PERO 158 0.89 monophasic   +---------+------------------+-----+-----------+-------+ Great Toe150 0.84    +---------+------------------+-----+-----------+-------+ +-------+-----------+-----------+------------+------------+ ABI/TBIToday's ABIToday's TBIPrevious ABIPrevious TBI +-------+-----------+-----------+------------+------------+ Right 1.03 0.86 1.11   +-------+-----------+-----------+------------+------------+ Left 1.06 0.84 1.27   +-------+-----------+-----------+------------+------------+ Tibial waveforms somewhat difficult to record due to irregular heartbeat. Bilateral ABIs appear essentially unchanged compared to prior study on 06/21/15. Summary: Right: Resting right ankle-brachial index is within normal range. No evidence of significant right lower extremity arterial disease. The right toe-brachial index is normal. Although ankle brachial indices are within normal limits (0.95-1.29), arterial Doppler waveforms at the ankle suggest some component of arterial occlusive disease. Left: Resting left ankle-brachial index is within normal range. No evidence of significant left lower extremity arterial disease. The left toe-brachial index is normal. Although ankle brachial indices are within normal limits (0.95-1.29), arterial Doppler waveforms at the ankle suggest some component of arterial occlusive disease. 12/15; the patient's area on the left mid tibia looks better. Right posterior calf also better. He has the area on the left foot as well. All of his toes look better I think this was tinea. We are  using Hydrofera Blue everywhere else The patient was in urgent care yesterday with wheezing and shortness of breath. He got azithromycin and prednisone. He feels better. His lungs are currently clear to auscultation. He was not tested for Covid 19 12/29; the patient arrives in clinic today with quite a bit change. 2 weeks ago he only had areas on the left mid tibia right posterior calf with tinea pedis resolving  between his toes. He arrives in clinic today with several areas on the dorsal toes on the right, dorsal left second toe. He has skin breakdown in the left medial calf probably from excess edema. Small area proximally in the medial calf. He has weeping edema fluid coming out of the skin excoriations on the left medial calf. He tells me that he is having a cardiac catheterization next week. I had a quick look at [facility]. He was Gross to have an ejection fraction of 25% during the work-up for persistent atrial fibrillation. He saw his cardiology office yesterday seen by the nurse practitioner. She increased his carvedilol. He has not been on diuretics for apparently several months and indeed in the nurse practitioner Dietrich Pates notes she had knowledge of this. 10/04/2019 on evaluation today patient presents as a walk-in visit concerning the fact that he did not have an appointment here for our clinic at this point. He actually had a cardiac catheterization earlier today and then came from there to here in order to be evaluated. With that being said unfortunately he is having significant cellulitis of his left lower extremity upon evaluation today this appears to have deteriorated even since last week's evaluation with Dr. Dellia Nims. The right lower extremity is actually doing okay I really see no evidence of deterioration at this point at those locations. In fact the right leg seems in general be doing quite well. Nonetheless I am concerned about infection and cellulitis of the left  lower extremity and again considering his weakened heart I do not want him to develop into sepsis at all. He is also having some trouble breathing today and I understand according to nursing staff this is always the case to some degree. With that being said the patient unfortunately seems to be in my opinion a little bit worse even his wife feels like that may be the case today. Unsure exactly what is leading to this. Cardiac catheterization I did review the report which showed an ejection fraction of 25% he also had an LAD blockage of around 25% based on what I saw. With that being said there was no significant blockages that required stenting at this point he does have weakened cardiac muscles compared to normal. 10/05/2019; patient was seen yesterday in clinic. He was sent to the ER because of cellulitis of the left leg possibility. In the ER he was given 1 dose of IV Levaquin and discharged on Keflex. He came in the clinic initially for a nurse visit to rewrap his left leg. We did not look at the right leg today that is an Haematologist. The patient also had a cardiac cath. According to him there were no blockages but a very low ejection fraction. 1/12; back for an early follow-up. The condition of the left lower leg is a lot better although there are multiple open areas. All of them with not a particularly viable surface. On the right posterior calf he has a single wound with a clean surface. He has a wound with exposed bone on the PIP of the left second toe dorsally. He has wounds on the dorsal right first second and third toes. His arterial studies were normal. 1/19; the patient has 3 open wounds on the left leg anteriorly in the mid tibia, distally and medially just above the ankle and posteriorly. On the right he has a small area dime sized on the right posterior calf. His edema control is a lot better. He still  has wounds on the right second and left second toes. The left second toe has exposed  bone. X- ray I did last week did not show evidence of osteomyelitis in the left foot. 1/26; patient with a multiplicity of wounds and problems. ooOn the left he has a circular area on the left anterior mid tibia ooLeft just above the medical ankle -left 2nd toe pip -right 2nd toe right posterior calf 2/2; patient with a multiplicity of wounds and problems. He has severe chronic venous insufficiency and the wounds on his leg are all on the left left anterior left medial at the medial malleolus and left posterior. We have been using Iodoflex to these areas to help with ongoing debridement. He also has largely traumatic wounds on his toes this includes the left second with exposed bone. Bone culture I did last week showed Staph aureus which is methicillin sensitive. I discussed with him today the idea of an amputation of this toe because it is literally nonfunctional however he wants to try antibiotics. Antibiotic choice is complicated by the fact that he is on Coumadin. He also has wounds over the dorsal part of the right first which is close to closed. Right second toe has exposed bone and the right third toe at the base of the right third toe is just about closed as well. We have been using silver alginate 2/9; patient with severe chronic venous insufficiency. He has large wounds on the left anterior mid tibia and on the left medial just above the ankle. I am concerned today about the depth of the area on the left mid tibia may be exposing into the muscle. He has small areas on the left posterior calf and on the right posterior calf although these do not look as threatening. He also has traumatic wounds on his toes. The right first and third are closed however the bilateral second toes have exposed bone. The area on the left is open into the distal interphalangeal joint. In my opinion these toes are nonsalvageable and will need amputation 2/16; patient with severe chronic venous  insufficiency. He has wounds on the left anterior mid tibia left medial lower leg just above the ankle. He has small areas on the left posterior calf and a more prominent area on the right posterior calf. All of these are related to poorly controlled venous hypertension. He also had traumatic wounds on the PIPs of both second toes. Unfortunately both of these have exposed bone and in the case of the left this I think goes right into the joint itself. I do not think either 1 of these is salvageable. I have reviewed his arterial evaluation that was done in July last year. He also saw [name] in June. He felt these were venous stasis. Previously had segmental arterial pressures performed in 11/03/2014 which were normal. He was sent for for a follow-up arterial evaluation on July 1. On the right his ABIs were 1.03 with a TBI of 0.86 although his waveforms were monophasic on the left he had multiphasic waveforms at the ATA PTA but monophasic at the peroneal nevertheless his ABI was normal at 1.06 TBI also normal at 0.84. I think he has enough blood flow to heal an amputation which I think he needs of the left second toe and probably the right second toe as well 2/23; the patient is going for his bilateral second toe amputation by Dr. Amalia Hailey on Friday. In the meantime is 3 areas on the left leg  and the small area on the right posterior look better. We have been using silver collagen. 3/2; the patient's toe amputations were canceled because of cardiac concerns. Apparently this surgery will need to be done in the [facility] although they do not have an appointment. In the meantime he is continues to have 3 areas on the left leg one anteriorly and 2 smaller areas posteriorly on the left as well as the small posterior area on the right. We have been using silver collagen with compression 3/9; still no word on the second toe amputations bilaterally. 3 wounds on the left leg all look better and the area on the  right posterior we have been using silver collagen I change this to [facility] 3/16; amputations next Monday both second toes he is apparently going to be hospitalized. He has on his left leg including left anterior and left lateral look better very superficial also the right posterior. 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 He continues to have left leg wounds anteriorly x2 posteriorly x1 at the left lower leg just above the ankle anteriorly x1 and posteriorly on the right x1 3/30; his anterior left leg wounds are considerably smaller. In fact the only wound that is not smaller is on the right. We have been using Hydrofera Blue under Kerlix Cobax 4/6; his anterior left leg wounds continue to contract in fact the area distally and medially has closed over. He still has the area anteriorly on the left. Both posterior calf wounds on the left and right are much smaller. We saw his surgical wound at the left second toe amputation site that still sutured it looks healthy, this is being followed by his podiatrist. We went over what compression he has he has bilateral juxta lites I am hopeful the issue here was that we just did not have them tight enough because he broke down very quickly the last time we healed him out. 4/13; the patient has 3 remaining wounds one small area on the left anterior lower leg one on the left posterior and one on the right anterior. We have been using Hydrofera Blue under compression compression all of these are contracting. We did discuss life in the eventuality he will totally closed which seems likely in the next week or 2. He has bilateral juxta lite stockings. These will need to be set at 30-40 compression. Since we last closed him over for his bilateral lower extremity wounds he is also been treated for heart failure which may help. He undoubtedly has  severe chronic venous hypertension however as well 4/19; the area on the left anterior tibial area is closed. The area on the right posterior calf is just above closed. The area on the left posterior calf has some depth. 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 Everything looks quite good here. He does not need a specific dressing Objective Constitutional Sitting or standing Blood Pressure is within target range for patient.. Pulse regular and within target range for patient.Marland Kitchen Respirations regular, non-labored and within target range.. Temperature is normal and within the target range for the patient.Marland Kitchen Appears in no distress. Vitals Time Taken: 8:12 AM, Height: 74 in, Weight: 212 lbs, BMI: 27.2, Temperature: 97.7 F,  Pulse: 57 bpm, Respiratory Rate: 18 breaths/min, Blood Pressure: 124/67 mmHg. Respiratory work of breathing is normal. Cardiovascular Edema control is excellent. General Notes: Wound exam; all of his wounds are improved. The area on the left anterior is healed. Right posterior should be healed by next week only a small superficial area remaining here. On the left posterior calf still a small wound but with some depth. Integumentary (Hair, Skin) changes severe chronic venous insufficiency. Wound #19 status is Healed - Epithelialized. Original cause of wound was Gradually Appeared. The wound is located on the Left,Anterior Lower Leg. The wound measures 0cm length x 0cm width x 0cm depth; 0cm^2 area and 0cm^3 volume. There is no tunneling or undermining noted. There is a none present amount of drainage noted. The wound margin is distinct with the outline attached to the wound base. There is no granulation within the wound bed. There is no necrotic tissue within the wound bed. Wound #25 status is Open. Original  cause of wound was Gradually Appeared. The wound is located on the Left,Distal,Posterior Lower Leg. The wound measures 0.8cm length x 0.4cm width x 0.2cm depth; 0.251cm^2 area and 0.05cm^3 volume. There is Fat Layer (Subcutaneous Tissue) Exposed exposed. There is no tunneling or undermining noted. There is a small amount of serosanguineous drainage noted. The wound margin is flat and intact. There is medium (34-66%) pink granulation within the wound bed. There is a medium (34-66%) amount of necrotic tissue within the wound bed including Adherent Slough. Wound #3R status is Open. Original cause of wound was Gradually Appeared. The wound is located on the Right,Posterior Calf. The wound measures 0.2cm length x 0.2cm width x 0.1cm depth; 0.031cm^2 area and 0.003cm^3 volume. There is Fat Layer (Subcutaneous Tissue) Exposed exposed. There is no tunneling or undermining noted. There is a small amount of serosanguineous drainage noted. The wound margin is flat and intact. There is large (67-100%) red granulation within the wound bed. There is no necrotic tissue within the wound bed. 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 - Tuesday **********EXTRA TIME**************** Dressing Change Frequency: Wound #25 Left,Distal,Posterior Lower  Leg: Do not change entire dressing for one week. Wound #3R Right,Posterior Calf: Do not change entire dressing for one week. Skin Barriers/Peri-Wound Care: TCA Cream or Ointment Wound Cleansing: May shower with protection. Primary Wound Dressing: Wound #25 Left,Distal,Posterior Lower Leg: Silver Collagen Wound #3R Right,Posterior Calf: Silver Collagen Secondary Dressing: Dry Gauze - secure toes with tape Edema Control: Wound #25 Left,Distal,Posterior Lower Leg: Kerlix and Coban - Bilateral Wound #3R Right,Posterior Calf: Kerlix and Coban - Bilateral Avoid standing for long periods of time Elevate legs to the level of the heart or above for 30 minutes daily and/or when sitting, a frequency of: - throughout the day. Support Garment 30-40 mm/Hg pressure to: - patient to bring juxtalites stockings into wound center weekly. Off-Loading: Open toe surgical shoe to: - left and right foot 1. Silver collagen bilaterally 2. Kerlix  and Coban bilaterally 3. I think the right leg should be healed by next week and ready to transition into the juxta lite stocking. The left posterior calf wound may take a little longer Electronic Signature(s) Signed: 01/16/2020 5:47:38 PM By: [name] MD Entered By: [name] on 01/16/2020 09:28:35 -------------------------------------------------------------------------------- SuperBill Details Patient Name: Date of Service: [name] 01/16/2020 Medical Record [id number] Patient Account Number: [id number] Date of Birth/Sex: Treating RN: December 17, 1940 (78 y.o. [name]) Dolores Lory, [place] Primary Care Provider: Jeralyn [facility] Other Clinician: Referring Provider: Treating Provider/Extender:Bryston Colocho, [name], [facility] Weeks in Treatment: 46 Diagnosis Coding ICD-10 Codes Code Description Y40.347 Non-pressure chronic ulcer of right calf limited to breakdown of skin L97.221 Non-pressure chronic ulcer of left calf limited to breakdown of skin I87.333  Chronic venous hypertension (idiopathic) with ulcer and inflammation of bilateral lower extremity E11.51 Type 2 diabetes mellitus with diabetic peripheral angiopathy without gangrene E11.42 Type 2 diabetes mellitus with diabetic polyneuropathy L97.524 Non-pressure chronic ulcer of other part of left foot with necrosis of bone L97.511 Non-pressure chronic ulcer of other part of right foot limited to breakdown of skin Q25.95 Chronic systolic (congestive) heart failure G38.756 Other chronic osteomyelitis, left ankle and foot Facility Procedures Physician Procedures CPT4: Code [id number] Description: 213 - WC PHYS LEVEL 3 - EST PT ICD-10 Diagnosis Description L97.211 Non-pressure chronic ulcer of right calf limited to brea L97.221 Non-pressure chronic ulcer of left calf limited to break I87.333 Chronic venous hypertension (idiopathic)  with ulcer and lower extremity Modifier: kdown of skin down of skin inflammation of Quantity: 1 bilateral Electronic Signature(s) Signed: 01/16/2020 5:47:38 PM By: [name] MD Entered By: [name] on 01/16/2020 41:66:06

## 2020-01-16 NOTE — Progress Notes (Signed)
[name], [name] ([id number]) Visit Report for 01/16/2020 Arrival Information Details Patient Name: Date of Service: [name], [name] 01/16/2020 8:00 AM Medical Record [place] Patient Account Number: [id number] Date of Birth/Sex: Treating RN: 1941-07-21 (79 y.o. [name]) [name] Primary Care [place] Strege: Jeralyn Ruths Other Clinician: Referring Jonnae Fonseca: Treating Namiko Pritts/Extender:Robson, Donneta Romberg, [facility] Weeks in Treatment: 2 Visit Information History Since Last Visit Added or deleted any medications: No Patient Arrived: Wheel Chair Any new allergies or adverse reactions: No Arrival Time: 08:11 Had a fall or experienced change in No Accompanied By: wife activities of daily living that may affect Transfer Assistance: None risk of falls: Patient Identification Verified: Yes Signs or symptoms of abuse/neglect since last No Secondary Verification Process Yes visito Completed: Hospitalized since last visit: No Patient Requires Transmission- No Implantable device outside of the clinic excluding No Based Precautions: cellular tissue based products placed in the center Patient Has Alerts: Yes since last visit: Patient Alerts: Patient on Blood Has Dressing in Place as Prescribed: Yes Thinner Pain Present Now: No Electronic Signature(s) Signed: 01/16/2020 1:29:28 PM By: Sandre Kitty Entered By: Sandre Kitty on 01/16/2020 08:11:57 -------------------------------------------------------------------------------- Clinic Level of Care Assessment Details Patient Name: Date of Service: [name], [name] 01/16/2020 8:00 AM Medical Record [place] Patient Account Number: [id number] Date of Birth/Sex: Treating RN: 03/16/1941 (79 y.o. [name]) [name] Primary Care Mosi Hannold: Jeralyn Ruths Other Clinician: Referring Deneka Greenwalt: Treating Meliana Canner/Extender:Robson, Donneta Romberg, [facility] Weeks in Treatment: 46 Clinic Level of Care Assessment Items TOOL 4 Quantity Score X -  Use when only an EandM is performed on FOLLOW-UP visit 1 0 ASSESSMENTS - Nursing Assessment / Reassessment X - Reassessment of Co-morbidities (includes updates in patient status) 1 10 X - Reassessment of Adherence to Treatment Plan 1 5 ASSESSMENTS - Wound and Skin Assessment / Reassessment [] - Simple Wound Assessment / Reassessment - one wound 0 X - Complex Wound Assessment / Reassessment - multiple wounds 2 5 [] - Dermatologic / Skin Assessment (not related to wound area) 0 ASSESSMENTS - Focused Assessment [] - Circumferential Edema Measurements - multi extremities 0 [] - Nutritional Assessment / Counseling / Intervention 0 [] - Lower Extremity Assessment (monofilament, tuning fork, pulses) 0 [] - Peripheral Arterial Disease Assessment (using hand held doppler) 0 ASSESSMENTS - Ostomy and/or Continence Assessment and Care [] - Incontinence Assessment and Management 0 [] - Ostomy Care Assessment and Management (repouching, etc.) 0 PROCESS - Coordination of Care X - Simple Patient / Family Education for ongoing care 1 15 [] - Complex (extensive) Patient / Family Education for ongoing care 0 X - Staff obtains Programmer, systems, Records, Test Results / Process Orders 1 10 [] - Staff telephones HHA, Nursing Homes / Clarify orders / etc 0 [] - Routine Transfer to another Facility (non-emergent condition) 0 [] - Routine Hospital Admission (non-emergent condition) 0 [] - New Admissions / Biomedical engineer / Ordering NPWT, Apligraf, etc. 0 [] - Emergency Hospital Admission (emergent condition) 0 X - Simple Discharge Coordination 1 10 [] - Complex (extensive) Discharge Coordination 0 PROCESS - Special Needs [] - Pediatric / Minor Patient Management 0 [] - Isolation Patient Management 0 [] - Hearing / Language / Visual special needs 0 [] - Assessment of Community assistance (transportation, D/C planning, etc.) 0 [] - Additional assistance / Altered mentation 0 [] - Support Surface(s) Assessment  (bed, cushion, seat, etc.) 0 INTERVENTIONS - Wound Cleansing / Measurement [] - Simple Wound Cleansing - one wound 0 X - Complex Wound Cleansing - multiple wounds  2 5 X - Wound Imaging (photographs - any number of wounds) 1 5 [] - Wound Tracing (instead of photographs) 0 [] - Simple Wound Measurement - one wound 0 X - Complex Wound Measurement - multiple wounds 2 5 INTERVENTIONS - Wound Dressings [] - Small Wound Dressing one or multiple wounds 0 [] - Medium Wound Dressing one or multiple wounds 0 X - Large Wound Dressing one or multiple wounds 2 20 X - Application of Medications - topical 1 5 [] - Application of Medications - injection 0 INTERVENTIONS - Miscellaneous [] - External ear exam 0 [] - Specimen Collection (cultures, biopsies, blood, body fluids, etc.) 0 [] - Specimen(s) / Culture(s) sent or taken to Lab for analysis 0 [] - Patient Transfer (multiple staff / Hoyer Lift / Similar devices) 0 [] - Simple Staple / Suture removal (25 or less) 0 [] - Complex Staple / Suture removal (26 or more) 0 [] - Hypo / Hyperglycemic Management (close monitor of Blood Glucose) 0 [] - Ankle / Brachial Index (ABI) - do not check if billed separately 0 X - Vital Signs 1 5 Has the patient been seen at the hospital within the last three years: Yes Total Score: 135 Level Of Care: New/Established - Level 4 Electronic Signature(s) Signed: 01/16/2020 5:39:56 PM By: Epps, Carrie RN Entered By: Epps, Carrie on 01/16/2020 09:05:58 -------------------------------------------------------------------------------- Encounter Discharge Information Details Patient Name: Date of Service: [name], Daniel D. 01/16/2020 8:00 AM Medical Record Number:[id number] Patient Account Number: [id number] Date of Birth/Sex: Treating RN: 03/13/1941 (79 y.o. M) Dwiggins, Shannon Primary Care Provider: GRAY, [name] Other Clinician: Referring Provider: Treating Provider/Extender:Robson, Michael GRAY, [name] Weeks in  Treatment: 46 Encounter Discharge Information Items Discharge Condition: Stable Ambulatory Status: Wheelchair Discharge Destination: Home Transportation: Private Auto Accompanied By: wife Schedule Follow-up Appointment: Yes Clinical Summary of Care: Patient Declined Electronic Signature(s) Signed: 01/16/2020 5:57:12 PM By: Dwiggins, Shannon Entered By: Dwiggins, Shannon on 01/16/2020 09:27:38 -------------------------------------------------------------------------------- Lower Extremity Assessment Details Patient Name: Date of Service: [name], Daniel D. 01/16/2020 8:00 AM Medical Record Number:[id number] Patient Account Number: [id number] Date of Birth/Sex: Treating RN: 03/01/1941 (78 y.o. M) Dwiggins, Shannon Primary Care Provider: GRAY, [name] Other Clinician: Referring Provider: Treating Provider/Extender:Robson, Michael GRAY, [name] Weeks in Treatment: 46 Edema Assessment Assessed: [Left: No] [Right: No] Edema: [Left: No] [Right: No] Calf Left: Right: Point of Measurement: 31 cm From Medial Instep 32 cm 31.5 cm Ankle Left: Right: Point of Measurement: 11 cm From Medial Instep 20.3 cm 20.5 cm Vascular Assessment Pulses: Dorsalis Pedis Palpable: [Left:Yes] [Right:Yes] Electronic Signature(s) Signed: 01/16/2020 5:57:12 PM By: Dwiggins, Shannon Entered By: Dwiggins, Shannon on 01/16/2020 08:18:34 -------------------------------------------------------------------------------- Multi Wound Chart Details Patient Name: Date of Service: [name], Daniel D. 01/16/2020 8:00 AM Medical Record Number:[id number] Patient Account Number: [id number] Date of Birth/Sex: Treating RN: 01/21/1941 (78 y.o. M) Epps, Carrie Primary Care Provider: GRAY, [name] Other Clinician: Referring Provider: Treating Provider/Extender:Robson, Michael GRAY, [name] Weeks in Treatment: 46 Vital Signs Height(in): 74 Pulse(bpm): 57 Weight(lbs): 212 Blood Pressure(mmHg): 124/67 Body Mass Index(BMI):  27 Temperature(°F): 97.7 Respiratory 18 Rate(breaths/min): Photos: [19:No Photos] [25:No Photos] [3R:No Photos] Wound Location: [19:Left, Anterior Lower Leg] [25:Left, Distal, Posterior Lower Right, Posterior Calf Leg] Wounding Event: [19:Gradually Appeared] [25:Gradually Appeared] [3R:Gradually Appeared] Primary Etiology: [19:Venous Leg Ulcer] [25:Venous Leg Ulcer] [3R:Diabetic Wound/Ulcer of the Lower Extremity] Secondary Etiology: [19:N/A] [25:Diabetic Wound/Ulcer of the N/A Lower Extremity] Comorbid History: [19:Cataracts, Hypertension, Cataracts, Hypertension, Cataracts, Hypertension, Peripheral Venous Disease, Peripheral Venous Disease, Peripheral Venous Disease, Type II Diabetes, Gout, Received Radiation] [  25:Type II Diabetes, Gout,  Received Radiation] [3R:Type II Diabetes, Gout, Received Radiation] Date Acquired: [19:09/23/2019] [25:10/04/2019] [3R:02/28/2019] Weeks of Treatment: [19:16] [25:14] [3R:46] Wound Status: [19:Healed - Epithelialized] [25:Open] [3R:Open] Wound Recurrence: [19:No] [25:No] [3R:Yes] Clustered Wound: [19:No] [25:Yes] [3R:No] Clustered Quantity: [19:N/A] [25:1] [3R:N/A] Measurements L x W x D 0x0x0 [25:0.8x0.4x0.2] [3R:0.2x0.2x0.1] (cm) Area (cm) : [19:0] [25:0.251] [3R:0.031] Volume (cm) : [19:0] [25:0.05] [3R:0.003] % Reduction in Area: [19:100.00%] [25:99.00%] [3R:99.90%] % Reduction in Volume: 100.00% [25:98.00%] [3R:99.90%] Classification: [19:Full Thickness Without Exposed Support Structures Exposed Support Structures] [25:Full Thickness Without] [3R:Grade 2] Exudate Amount: [19:None Present] [25:Small] [3R:Small] Exudate Type: [19:N/A] [25:Serosanguineous] [3R:Serosanguineous] Exudate Color: [19:N/A] [25:red, brown] [3R:red, brown] Wound Margin: [19:Distinct, outline attached] [25:Flat and Intact] [3R:Flat and Intact] Granulation Amount: [19:None Present (0%)] [25:Medium (34-66%)] [3R:Large (67-100%)] Granulation Quality: [19:N/A] [25:Pink]  [3R:Red] Necrotic Amount: [19:None Present (0%)] [25:Medium (34-66%)] [3R:None Present (0%)] Exposed Structures: [19:Fascia: No Fat Layer (Subcutaneous Tissue) Exposed: No Tendon: No Muscle: No Joint: No Bone: No None] [25:Fat Layer (Subcutaneous Tissue) Exposed: Yes Fascia: No Tendon: No Muscle: No Joint: No Bone: No Small (1-33%)] [3R:Fat Layer (Subcutaneous  Tissue) Exposed: Yes Fascia: No Tendon: No Muscle: No Joint: No Bone: No Large (67-100%)] Treatment Notes Electronic Signature(s) Signed: 01/16/2020 5:39:56 PM By: Epps, Carrie RN Signed: 01/16/2020 5:47:38 PM By: Robson, Michael MD Entered By: Robson, Michael on 01/16/2020 09:24:55 -------------------------------------------------------------------------------- Multi-Disciplinary Care Plan Details Patient Name: Date of Service: [name], Daniel D. 01/16/2020 8:00 AM Medical Record Number:[id number] Patient Account Number: [id number] Date of Birth/Sex: Treating RN: 11/04/1940 (78 y.o. M) Epps, Carrie Primary Care Provider: GRAY, [name] Other Clinician: Referring Provider: Treating Provider/Extender:Robson, Michael GRAY, [name] Weeks in Treatment: 46 Active Inactive Wound/Skin Impairment Nursing Diagnoses: Knowledge deficit related to ulceration/compromised skin integrity Goals: Patient/caregiver will verbalize understanding of skin care regimen Date Initiated: 02/28/2019 Target Resolution Date: 01/30/2020 Goal Status: Active Ulcer/skin breakdown will have a volume reduction of 30% by week 4 Date Initiated: 02/28/2019 Date Inactivated: 04/04/2019 Target Resolution Date: 03/31/2019 Goal Status: Met Ulcer/skin breakdown will have a volume reduction of 50% by week 8 Date Initiated: 04/04/2019 Date Inactivated: 05/09/2019 Target Resolution Date: 05/05/2019 Goal Status: Met Ulcer/skin breakdown will have a volume reduction of 80% by week 12 Date Initiated: 05/09/2019 Date Inactivated: 06/13/2019 Target Resolution Date: 06/09/2019 Unmet Goal Status:  Unmet Reason: comorbities/new wounds Ulcer/skin breakdown will heal within 14 weeks Date Initiated: 06/13/2019 Date Inactivated: 07/11/2019 Target Resolution Date: 07/07/2019 Unmet Goal Status: Unmet Reason: comorbityies Interventions: Assess patient/caregiver ability to obtain necessary supplies Assess patient/caregiver ability to perform ulcer/skin care regimen upon admission and as needed Assess ulceration(s) every visit Notes: Electronic Signature(s) Signed: 01/16/2020 5:39:56 PM By: Epps, Carrie RN Entered By: Epps, Carrie on 01/16/2020 07:59:31 -------------------------------------------------------------------------------- Pain Assessment Details Patient Name: Date of Service: [name], Daniel D. 01/16/2020 8:00 AM Medical Record Number:[id number] Patient Account Number: [id number] Date of Birth/Sex: Treating RN: 01/25/1941 (78 y.o. M) Epps, Carrie Primary Care Provider: GRAY, [name] Other Clinician: Referring Provider: Treating Provider/Extender:Robson, Michael GRAY, [name] Weeks in Treatment: 46 Active Problems Location of Pain Severity and Description of Pain Patient Has Paino No Site Locations Pain Management and Medication Current Pain Management: Electronic Signature(s) Signed: 01/16/2020 1:29:28 PM By: Dawkins, Destiny Signed: 01/16/2020 5:39:56 PM By: Epps, Carrie RN Entered By: Dawkins, Destiny on 01/16/2020 08:14:32 -------------------------------------------------------------------------------- Patient/Caregiver Education Details Patient Name: Date of Service: [name], Daniel D. 4/20/2021andnbsp8:00 AM Medical Record Number:[id number] Patient Account Number: [id number] Date of Birth/Gender: Treating RN: 03/11/1941 (78 y.o. M) Epps, Carrie Primary Care Physician: GRAY, [name] Other   Clinician: Referring Physician: Treating Physician/Extender:Robson, Michael GRAY, [name] Weeks in Treatment: 46 Education Assessment Education Provided To: Patient Education Topics  Provided Wound/Skin Impairment: Methods: Explain/Verbal Responses: State content correctly Electronic Signature(s) Signed: 01/16/2020 5:39:56 PM By: Epps, Carrie RN Entered By: Epps, Carrie on 01/16/2020 07:59:46 -------------------------------------------------------------------------------- Wound Assessment Details Patient Name: Date of Service: [name], Daniel D. 01/16/2020 8:00 AM Medical Record Number:[id number] Patient Account Number: [id number] Date of Birth/Sex: Treating RN: 03/04/1941 (78 y.o. M) Dwiggins, Shannon Primary Care Provider: GRAY, [name] Other Clinician: Referring Provider: Treating Provider/Extender:Robson, Michael GRAY, [name] Weeks in Treatment: 46 Wound Status Wound Number: 19 Primary Venous Leg Ulcer Etiology: Wound Location: Left, Anterior Lower Leg Wound Healed - Epithelialized Wounding Event: Gradually Appeared Status: Date Acquired: 09/23/2019 Comorbid Cataracts, Hypertension, Peripheral Venous Weeks Of Treatment: 16 History: Disease, Type II Diabetes, Gout, Received Clustered Wound: No Radiation Wound Measurements Length: (cm) 0 % Reduct Width: (cm) 0 % Reduct Depth: (cm) 0 Epitheli Area: (cm) 0 Tunneli Volume: (cm) 0 Undermi Wound Description Full Thickness Without Exposed Support Foul Odo Classification: Structures Slough/F Wound Distinct, outline attached Margin: Exudate None Present Amount: Wound Bed Granulation Amount: None Present (0%) Necrotic Amount: None Present (0%) Fascia E Fat Laye Tendon E Muscle E Joint Ex Bone Exp r After Cleansing: No ibrino No Exposed Structure xposed: No r (Subcutaneous Tissue) Exposed: No xposed: No xposed: No posed: No osed: No ion in Area: 100% ion in Volume: 100% alization: None ng: No ning: No Electronic Signature(s) Signed: 01/16/2020 5:57:12 PM By: Dwiggins, Shannon Entered By: Dwiggins, Shannon on 01/16/2020  08:20:17 -------------------------------------------------------------------------------- Wound Assessment Details Patient Name: Date of Service: [name], Daniel D. 01/16/2020 8:00 AM Medical Record Number:[id number] Patient Account Number: [id number] Date of Birth/Sex: Treating RN: 10/03/1940 (78 y.o. M) Dwiggins, Shannon Primary Care Provider: GRAY, [name] Other Clinician: Referring Provider: Treating Provider/Extender:Robson, Michael GRAY, [name] Weeks in Treatment: 46 Wound Status Wound Number: 25 Primary Venous Leg Ulcer Etiology: Wound Location: Left, Distal, Posterior Lower Leg Secondary Diabetic Wound/Ulcer of the Lower Extremity Wounding Event: Gradually Appeared Etiology: Date Acquired: 10/04/2019 Wound Open Weeks Of Treatment: 14 Status: Clustered Wound: Yes Comorbid Cataracts, Hypertension, Peripheral Venous History: Disease, Type II Diabetes, Gout, Received Radiation Wound Measurements Length: (cm) 0.8 % Reduct Width: (cm) 0.4 % Reduct Depth: (cm) 0.2 Epitheli Clustered Quantity: 1 Tunnelin Area: (cm) 0.251 Undermi Volume: (cm) 0.05 Wound Description Classification: Full Thickness Without Exposed Support Foul Odo Structures Slough/F Wound Flat and Intact Margin: Exudate Small Amount: Exudate Serosanguineous Type: Exudate red, brown Color: Wound Bed Granulation Amount: Medium (34-66%) Granulation Quality: Pink Fascia E Necrotic Amount: Medium (34-66%) Fat Laye Necrotic Quality: Adherent Slough Tendon E Muscle E Joint Ex Bone Exp r After Cleansing: No ibrino Yes Exposed Structure xposed: No r (Subcutaneous Tissue) Exposed: Yes xposed: No xposed: No posed: No osed: No ion in Area: 99% ion in Volume: 98% alization: Small (1-33%) g: No ning: No Treatment Notes Wound #25 (Left, Distal, Posterior Lower Leg) 1. Cleanse With Wound Cleanser Soap and water 2. Periwound Care Moisturizing lotion TCA Ointment 3. Primary Dressing Applied Collegen  AG 4. Secondary Dressing Dry Gauze 6. Support Layer Applied Kerlix/Coban Electronic Signature(s) Signed: 01/16/2020 5:57:12 PM By: Dwiggins, Shannon Entered By: Dwiggins, Shannon on 01/16/2020 08:21:09 -------------------------------------------------------------------------------- Wound Assessment Details Patient Name: Date of Service: [name], Daniel D. 01/16/2020 8:00 AM Medical Record Number:[id number] Patient Account Number: [id number] Date of Birth/Sex: Treating RN: 12/25/1940 (78 y.o. M) Dwiggins, Shannon Primary Care Provider: GRAY, [name] Other Clinician: Referring Provider: Treating Provider/Extender:Robson, Michael GRAY, [name]   Weeks in Treatment: 46 Wound Status Wound Number: 3R Primary Diabetic Wound/Ulcer of the Lower Extremity Etiology: Wound Location: Right, Posterior Calf Wound Open Wounding Event: Gradually Appeared Status: Date Acquired: 02/28/2019 Comorbid Cataracts, Hypertension, Peripheral Venous Weeks Of Treatment: 46 History: Disease, Type II Diabetes, Gout, Received Clustered Wound: No Radiation Wound Measurements Length: (cm) 0.2 % Reduction Width: (cm) 0.2 % Reduction Depth: (cm) 0.1 Epitheliali Area: (cm) 0.031 Tunneling: Volume: (cm) 0.003 Underminin Wound Description Classification: Grade 2 Wound Margin: Flat and Intact Exudate Amount: Small Exudate Type: Serosanguineous Exudate Color: red, brown Wound Bed Granulation Amount: Large (67-100%) Granulation Quality: Red Necrotic Amount: None Present (0%) Foul Odor After Cleansing: No Slough/Fibrino No Exposed Structure Fascia Exposed: No Fat Layer (Subcutaneous Tissue) Exposed: Yes Tendon Exposed: No Muscle Exposed: No Joint Exposed: No Bone Exposed: No in Area: 99.9% in Volume: 99.9% zation: Large (67-100%) No g: No Treatment Notes Wound #3R (Right, Posterior Calf) 1. Cleanse With Wound Cleanser Soap and water 2. Periwound Care Moisturizing lotion TCA Ointment 3. Primary  Dressing Applied Collegen AG 4. Secondary Dressing Dry Gauze 6. Support Layer Holiday representative) Signed: 01/16/2020 5:57:12 PM By: Kela Millin Entered By: Kela Millin on 01/16/2020 08:20:49 -------------------------------------------------------------------------------- Vitals Details Patient Name: Date of Service: [name] 01/16/2020 8:00 AM Medical Record [id number] Patient Account Number: [id number] Date of Birth/Sex: Treating RN: [birth year] (78 y.o. [name]) [name] Primary Care Shunte Senseney: Jeralyn Ruths Other Clinician: Referring Fatime Biswell: Treating Kinzey Sheriff/Extender:Robson, Donneta Romberg, [name] Weeks in Treatment: 19 Vital Signs Time Taken: 08:12 Temperature (F): 97.7 Height (in): 74 Pulse (bpm): 57 Weight (lbs): 212 Respiratory Rate (breaths/min): 18 Body Mass Index (BMI): 27.2 Blood Pressure (mmHg): 124/67 Reference Range: 80 - 120 mg / dl Electronic Signature(s) Signed: 01/16/2020 1:29:28 PM By: Sandre Kitty Entered By: Sandre Kitty on 01/16/2020 68:34:19

## 2020-01-17 ENCOUNTER — Ambulatory Visit (INDEPENDENT_AMBULATORY_CARE_PROVIDER_SITE_OTHER): Payer: Medicare HMO | Admitting: *Deleted

## 2020-01-17 DIAGNOSIS — I4821 Permanent atrial fibrillation: Secondary | ICD-10-CM | POA: Diagnosis not present

## 2020-01-17 DIAGNOSIS — Z5181 Encounter for therapeutic drug level monitoring: Secondary | ICD-10-CM

## 2020-01-17 LAB — POCT INR: INR: 2 (ref 2.0–3.0)

## 2020-01-17 MED ORDER — WARFARIN SODIUM 5 MG PO TABS: 5.0000 mg | ORAL_TABLET | Freq: Every day | ORAL | 0 refills | Status: DC

## 2020-01-17 NOTE — Progress Notes (Signed)
   Subjective:  Patient presents today status post bilateral 2nd toe amputation. DOS: 12/18/2019. He states he is doing well. He denies any pain or modifying factors. He has been using the post op shoes as directed. Patient is here for further evaluation and treatment.   Past Medical History:  Diagnosis Date  . Cancer ([place])   . CHF (congestive heart failure) ([place])   . Chronic atrial fibrillation ([place])   . Chronic bronchitis   . Diabetes mellitus, type II ([place])    no insulin  . Gout   . History of herpes zoster virus   . History of radiation therapy 12/21/12- 02/15/13   prostate 79 gray in 41 fx  . Hyperlipidemia   . Hypertension   . Prostate cancer ([place]) 2014   EBRT + hormonal therapy  . Vitamin D deficiency disease 06/07/2019      Objective/Physical Exam Neurovascular status intact.  Skin incisions appear to be well coapted and healed. No sign of infectious process noted. No dehiscence. No active bleeding noted. Moderate edema noted to the surgical extremity.  Assessment: 1. s/p bilateral 2nd toe amputations. DOS: 12/18/2019 - healed    Plan of Care:  1. Patient was evaluated 2. Sutures removed.  3. Discontinue using post op shoes.  4. Continue leg wound management with [place].  5. Return to clinic as needed.    Edrick Kins, DPM [facility]  Dr. Edrick Kins, [address]                                        [place], [place] [address]                Office [phone]  Fax [phone]

## 2020-01-17 NOTE — Patient Instructions (Signed)
Increase warfarin to 1 tablet daily  Recheck in 4 weeks

## 2020-01-18 NOTE — Telephone Encounter (Signed)
Pt and wife notified 01/05/20

## 2020-01-23 ENCOUNTER — Encounter (HOSPITAL_BASED_OUTPATIENT_CLINIC_OR_DEPARTMENT_OTHER): Payer: Medicare HMO | Admitting: Internal Medicine

## 2020-01-23 ENCOUNTER — Other Ambulatory Visit: Payer: Self-pay

## 2020-01-23 DIAGNOSIS — I87333 Chronic venous hypertension (idiopathic) with ulcer and inflammation of bilateral lower extremity: Secondary | ICD-10-CM | POA: Diagnosis not present

## 2020-01-23 DIAGNOSIS — I872 Venous insufficiency (chronic) (peripheral): Secondary | ICD-10-CM | POA: Diagnosis not present

## 2020-01-23 DIAGNOSIS — E11622 Type 2 diabetes mellitus with other skin ulcer: Secondary | ICD-10-CM | POA: Diagnosis not present

## 2020-01-23 DIAGNOSIS — E1142 Type 2 diabetes mellitus with diabetic polyneuropathy: Secondary | ICD-10-CM | POA: Diagnosis not present

## 2020-01-23 DIAGNOSIS — L97221 Non-pressure chronic ulcer of left calf limited to breakdown of skin: Secondary | ICD-10-CM | POA: Diagnosis not present

## 2020-01-23 DIAGNOSIS — L97211 Non-pressure chronic ulcer of right calf limited to breakdown of skin: Secondary | ICD-10-CM | POA: Diagnosis not present

## 2020-01-23 DIAGNOSIS — L97222 Non-pressure chronic ulcer of left calf with fat layer exposed: Secondary | ICD-10-CM | POA: Diagnosis not present

## 2020-01-23 DIAGNOSIS — M86672 Other chronic osteomyelitis, left ankle and foot: Secondary | ICD-10-CM | POA: Diagnosis not present

## 2020-01-23 DIAGNOSIS — L97524 Non-pressure chronic ulcer of other part of left foot with necrosis of bone: Secondary | ICD-10-CM | POA: Diagnosis not present

## 2020-01-23 DIAGNOSIS — E1151 Type 2 diabetes mellitus with diabetic peripheral angiopathy without gangrene: Secondary | ICD-10-CM | POA: Diagnosis not present

## 2020-01-23 DIAGNOSIS — L97511 Non-pressure chronic ulcer of other part of right foot limited to breakdown of skin: Secondary | ICD-10-CM | POA: Diagnosis not present

## 2020-01-23 NOTE — Progress Notes (Signed)
DENI, LEFEVER ([id number]) Visit Report for 01/23/2020 Debridement Details Patient Name: Date of Service: [name], [name] 01/23/2020 8:00 AM Medical Record [place] Patient Account Number: [id number] Date of Birth/Sex: Treating RN: [birth year] (78 y.o. [name]) [name] Primary Care Provider: Jeralyn Ruths Other Clinician: Referring Provider: Treating Provider/Extender:Ronnette Rump, Donneta Romberg, [facility] Weeks in Treatment: 47 Debridement Performed for Wound #25 Left,Distal,Posterior Lower Leg Assessment: Performed By: Physician Ricard Dillon., MD Debridement Type: Debridement Severity of Tissue Pre Fat layer exposed Debridement: Level of Consciousness (Pre- Awake and Alert procedure): Pre-procedure Verification/Time Out Taken: Yes - 08:27 Start Time: 08:27 Pain Control: Lidocaine 5% topical ointment Total Area Debrided (L x W): 0.8 (cm) x 0.4 (cm) = 0.32 (cm) Tissue and other material Viable, Non-Viable, Slough, Subcutaneous, Skin: Dermis , Skin: Epidermis, Slough debrided: Level: Skin/Subcutaneous Tissue Debridement Description: Excisional Instrument: Curette Bleeding: Moderate Hemostasis Achieved: Pressure End Time: 08:29 Procedural Pain: 0 Post Procedural Pain: 0 Response to Treatment: Procedure was tolerated well Level of Consciousness Awake and Alert (Post-procedure): Post Debridement Measurements of Total Wound Length: (cm) 0.8 Width: (cm) 0.4 Depth: (cm) 0.2 Volume: (cm) 0.05 Character of Wound/Ulcer Post Improved Debridement: Severity of Tissue Post Debridement: Fat layer exposed Post Procedure Diagnosis Same as Pre-procedure Electronic Signature(s) Signed: 01/23/2020 5:28:23 PM By: [name] RN Signed: 01/23/2020 5:41:23 PM By: [name] MD Entered By: [name] on 01/23/2020 08:55:50 -------------------------------------------------------------------------------- HPI Details Patient Name: Date of Service: Daniel Pia D.  01/23/2020 8:00 AM Medical Record [id number] Patient Account Number: [id number] Date of Birth/Sex: Treating RN: 1940-11-25 (78 y.o. [name] Primary Care Provider: Jeralyn Ruths Other Clinician: Referring Provider: Treating Provider/Extender:Carmelle Bamberg, Donneta Romberg, [facility] Weeks in Treatment: 29 History of Present Illness Location: Patient presents with a wound to left lower leg. Quality: Patient reports No Pain. Duration: 2 months HPI Description: no cig or alcohol. spontaneous appearance in area of stasis dermamtitis. [name] on metformin only. chronic afib on Coumadin. diabetes and coag studies not good. hba1c 7.5. ivr 4.5. no pain or sxs of systemic disease. hx chf. no intermittent claudication 02/28/2019 Readmission This is a now a 79 year old man who was previously cared for in 2016 by Dr. Lindon Romp for wounds on his lower extremities. At that point he had venous reflux studies although I cannot seem to open these in [place] link. He had arterial studies showing an ABI of 1.11 on the right and 1.27 on the left his waveforms were triphasic bilaterally. He was discharged in stockings although I do not believe he is wearing these in some time. He tells me that about a month ago he noted openings of a large wound on the posterior right calf and 2 smaller areas on the left lateral calf and a small area more recently on the left posterior calf. He has been dressing these with peroxide and triple antibiotic ointment. He is not wearing compression. Past medical history; type 2 diabetes with peripheral neuropathy, chronic venous insufficiency, hypertension, cardiomyopathy, chronic atrial fibrillation on Coumadin, prostate cancer, hyperlipidemia, gout, ABI in our clinic was 1.34 on the left and not obtainable on the right 6/9; this is a patient who has chronic venous insufficiency. He has a fairly substantial area on the right posterior calf, left lateral calf and a small area on the left  posterior calf. On arrival last week he had very palpable popliteal and femoral pulses but nothing in his bilateral feet. Unfortunately we cannot get arterial studies until July 1 at Dr. Kennon Holter office. They live in [place]. We  use silver alginate under Kerlix Coban 6/16; patient with chronic venous insufficiency with wounds on his bilateral lower extremities. When he came into our clinic he was discovered to have a complete absence of peripheral pedal pulses at either the dorsalis pedis or posterior tibial. He does have easily palpable femoral and popliteal pulses. He sees Dr. [name] tomorrow for noninvasive arterial tests. He may also require venous reflux evaluation although I do not view this as an urgent thing. We have been using silver alginate. His wound surfaces of cleaned up quite nicely 6/23; patient with chronic venous insufficiency with wounds on his bilateral lower extremities. His wounds all are somewhat better looking. He did go to Dr. Kennon Holter office but somehow ended up on the doctors schedule rather than being scheduled for noninvasive tests therefore his noninvasive tests are scheduled for July 1. We agree that he has venous insufficiency ulcers but I cannot feel any pulses in his lower extremities dictating the need for test. We are only using Kerlix and light Coban unfortunately this appears to be holding the edema 6/30; has his arterial studies tomorrow. We have been using Kerlix and light Coban will go to a more aggressive compression if the arterial studies will allow. We all agreed these are venous wounds however I cannot feel pulses at either the dorsalis pedis or posterior tibial bilaterally. His wounds generally look some better including left lateral and right posterior. 7/7-Patient returns at 1 week in Kerlix/Coban to both legs, with improvement, in the left lateral and right posterior lower leg wounds, ABI's are normal in both legs per vascular studies, TBI is also  normal on both sides, we are using hydrofera blue to the wounds 7/14; patient's arterial studies from 2 weeks ago showed an ABI on the right at 1.03 with a TBI of 0.86. On the left the ABI was 1.06 with a TBI of 0.84. Notable for the fact that his arterial waveforms were monophasic in all of the lower extremity arteries suggesting some degree of arterial occlusive disease but in general this was felt to be fairly adequate for healing. His compression was increased from 2-3 layer which is appropriate. Dressing was changed to [facility] 7/21; patient's wounds are measuring smaller. The more substantial one on the right posterior calf, second 1 on the left lateral calf. Using [facility] on both wound areas 7/28; patient continues to make nice improvements. The area on the right posterior calf is smaller. Area on the left lateral calf also is smaller. We have been using Hydrofera Blue under compression. The patient will need compression stockings and we have measured him for these in the eventuality that these heal which really should not be too long from now 8/4-Patient continues to make improvement, the right posterior calf area smaller with rim of keratotic skin on one side, the left wound is definitely smaller and improving. 8/11-Returns at 1 week, after being in 3 layer compression on both legs, both wounds appear to be improving, making good progress, patient is happy, pain is also less especially in the right leg wound 8/18; the area on the left anterior lower leg is healed. On the right posterior leg the wound remains although the dimensions are a lot better. 8/25; he arrives in clinic today with a large body of open wound on the left lateral calf. All of the 3 wounds in this area are in close juxtaposition to each other. The story is that we discharged him last week with no a wrap  on the left leg. They went to  could not get in as they are only excepting phone orders or  online orders for stockings hence they did not put any stocking on the left leg all week. They have something at home but the patient with that was either incapable or just did not put them on. Apparently these opened 1 morning after getting out of bed. The area on the right has no real change 9/1; patient has bilateral lower extremity wounds in the setting of severe chronic venous insufficiency and secondary lymphedema. He arrived last week with new areas on the left lateral lower leg after we did not wrap him and he did not use his stockings. Nevertheless the areas on the left look better today under compression. Posterior right calf does not really changed. We are using Hydrofera Blue on both areas under compression 9/15; bilateral lower extremity wounds in the setting of severe chronic venous insufficiency and secondary lymphedema. He has 20 to 30 mmHg below-knee compression stockings under the eventuality that these close over. We did get the left leg to close but he did not transition to a stocking and this reopened. There are 2 open areas on the left posterior lateral calf and one on the right. Both of these look satisfactory. Using [facility] 9/22; bilateral lower extremity wounds in the setting of chronic venous insufficiency. 2 superficial areas on the left lateral calf. One on the right just above the Achilles area. We have good edema control we have been using Hydrofera Blue 9/29; the areas on the left lateral calf are healed. On the right just above the Achilles and tendon area things look a lot better small wounds one scabbed area. We have been using Hydrofera Blue. We can discharge him in his own stocking on the left still wrapping on the right. This is the second time we have healed the left leg but he did not put a stocking on last time. Hopefully this will maintain the edema from chronic venous disease with secondary lymphedema 10/6; he comes in today having a stocking on  the left leg. They had trouble getting it on there is a lot of increase in swelling 2 small open areas one anteriorly and one on the medial calf. They report a lot of difficulty getting the stocking on. Paradoxically the area on the right that we have been wrapping posteriorly is closed 10/13; he comes in today with wounds bilaterally including superficial areas on the left medial and left lateral calf. As well as the right posterior has reopened in the Achilles area superiorly. He still does not have his juxta lite stockings although truthfully we would not of been able to use them today anyway. Apparently have been ordered and paid for from [employer] although they have not been delivered 10/20; his area on the right is just the boat closed on the right posterior. Still has the area on the left lateral and a very tiny area on the left medial. He has his bilateral juxta lites although he is not ready for them this week. He tolerated the increase to 4 layer compression last week quite well 10/27; the area on the right posterior calf is once again closed. He has a superficial area on the left lateral calf that is still open. He has been using Hydrofera Blue and bilateral 4-layer compression. He can change to his own juxta lite stocking on the right and we are instructing him today 11/3; the area on the  right posterior calf reopened according to the patient and his wife after they took off the stocking when they got home last week. Apparently scabbed over there is now a fairly substantial wound which looks pretty much the same. Our intake nurse noted that they were using the juxta lite stockings appropriately. I was really hoping I might be able to close him out today. He has 1 very tiny remaining area on the left lateral lower leg. 11/10; right posterior calf wound measures smaller but is still open. We have been using Hydrofera Blue. On the left he has a small oval-shaped wound and he seems to have had  another wound distally that is open and likely a blister. We are using Hydrofera Blue under compression 11/17; right posterior calf wound continues to get better. We have been using Hydrofera Blue. On the left lateral one of the wounds has closed still a small open area. We have been using Hydrofera Blue on this as well. Both areas have been under 4-layer compression Arrives in clinic today with some swelling in the dorsal foot on the right some erythema of his forefoot and toes. Initially when I looked at this I almost thought this was a sunburn distal to a wrap injury. 12/1; right posterior calf wound debrided with a curette. We have been using Hydrofera Blue on the left anterior lateral he has an area across the mid tibia. Finally a small area on the left lateral lower calf. Finally he continues to have de- epithelialized areas on the dorsal aspect of his toes. Initially thought this might be a burn injury when I saw him 2 weeks ago. I now wonder about tinea. I have also reviewed his arterial studies which were really quite good in July/20 with normal TBI's and ABIs but monophasic waveforms 12/8; comes in today with worsening problems especially on the left leg where he now has a cluster of wounds in the left anterior mid tibia. Very poor edema control. I reduced him to 3 layer from 4 layer compression last week because of some concern about blood flow to his toes however he does not have good edema control on the left leg. Right leg edema control looks satisfactory. On the left he has a cluster of wounds anteriorly, small area on the left medial fifth met head and then the collection of areas on his toes which appear better On the right he has the original area on the right posterior calf, a new area right medially. His formal arterial studies from mid July are noted below. He was evaluated by Dr.Berry ABI Findings: +---------+------------------+-----+----------+--------+ Right Rt  Pressure (mmHg)IndexWaveform Comment  +---------+------------------+-----+----------+--------+ Brachial 176     +---------+------------------+-----+----------+--------+ ATA 176 0.99 monophasic  +---------+------------------+-----+----------+--------+ PTA 183 1.03 monophasic  +---------+------------------+-----+----------+--------+ PERO 172 0.97 monophasic  +---------+------------------+-----+----------+--------+ Great Toe153 0.86    +---------+------------------+-----+----------+--------+ +---------+------------------+-----+-----------+-------+ Left Lt Pressure (mmHg)IndexWaveform Comment +---------+------------------+-----+-----------+-------+ Brachial 178     +---------+------------------+-----+-----------+-------+ ATA 162 0.91 multiphasic  +---------+------------------+-----+-----------+-------+ PTA 188 1.06 multiphasic  +---------+------------------+-----+-----------+-------+ PERO 158 0.89 monophasic   +---------+------------------+-----+-----------+-------+ Great Toe150 0.84    +---------+------------------+-----+-----------+-------+ +-------+-----------+-----------+------------+------------+ ABI/TBIToday's ABIToday's TBIPrevious ABIPrevious TBI +-------+-----------+-----------+------------+------------+ Right 1.03 0.86 1.11   +-------+-----------+-----------+------------+------------+ Left 1.06 0.84 1.27   +-------+-----------+-----------+------------+------------+ Tibial waveforms somewhat difficult to record due to irregular heartbeat. Bilateral ABIs appear essentially unchanged compared to prior study on 06/21/15. Summary: Right: Resting right ankle-brachial index is within normal range. No evidence of significant right lower extremity arterial disease. The right toe-brachial index is normal. Although ankle brachial indices are within normal  limits (0.95-1.29), arterial Doppler  waveforms at the ankle suggest some component of arterial occlusive disease. Left: Resting left ankle-brachial index is within normal range. No evidence of significant left lower extremity arterial disease. The left toe-brachial index is normal. Although ankle brachial indices are within normal limits (0.95-1.29), arterial Doppler waveforms at the ankle suggest some component of arterial occlusive disease. 12/15; the patient's area on the left mid tibia looks better. Right posterior calf also better. He has the area on the left foot as well. All of his toes look better I think this was tinea. We are using Hydrofera Blue everywhere else The patient was in urgent care yesterday with wheezing and shortness of breath. He got azithromycin and prednisone. He feels better. His lungs are currently clear to auscultation. He was not tested for Covid 19 12/29; the patient arrives in clinic today with quite a bit change. 2 weeks ago he only had areas on the left mid tibia right posterior calf with tinea pedis resolving between his toes. He arrives in clinic today with several areas on the dorsal toes on the right, dorsal left second toe. He has skin breakdown in the left medial calf probably from excess edema. Small area proximally in the medial calf. He has weeping edema fluid coming out of the skin excoriations on the left medial calf. He tells me that he is having a cardiac catheterization next week. I had a quick look at [facility] health link. He was found to have an ejection fraction of 25% during the work-up for persistent atrial fibrillation. He saw his cardiology office yesterday seen by the nurse practitioner. She increased his carvedilol. He has not been on diuretics for apparently several months and indeed in the nurse practitioner Dietrich Pates notes she had knowledge of this. 10/04/2019 on evaluation today patient presents as a walk-in visit concerning the fact that he did not have an appointment here  for our clinic at this point. He actually had a cardiac catheterization earlier today and then came from there to here in order to be evaluated. With that being said unfortunately he is having significant cellulitis of his left lower extremity upon evaluation today this appears to have deteriorated even since last week's evaluation with Dr. Dellia Nims. The right lower extremity is actually doing okay I really see no evidence of deterioration at this point at those locations. In fact the right leg seems in general be doing quite well. Nonetheless I am concerned about infection and cellulitis of the left lower extremity and again considering his weakened heart I do not want him to develop into sepsis at all. He is also having some trouble breathing today and I understand according to nursing staff this is always the case to some degree. With that being said the patient unfortunately seems to be in my opinion a little bit worse even his wife feels like that may be the case today. Unsure exactly what is leading to this. Cardiac catheterization I did review the report which showed an ejection fraction of 25% he also had an LAD blockage of around 25% based on what I saw. With that being said there was no significant blockages that required stenting at this point he does have weakened cardiac muscles compared to normal. 10/05/2019; patient was seen yesterday in clinic. He was sent to the ER because of cellulitis of the left leg possibility. In the ER he was given 1 dose of IV Levaquin and discharged on Keflex. He came in the clinic  initially for a nurse visit to rewrap his left leg. We did not look at the right leg today that is an Haematologist. The patient also had a cardiac cath. According to him there were no blockages but a very low ejection fraction. 1/12; back for an early follow-up. The condition of the left lower leg is a lot better although there are multiple open areas. All of them with not a particularly  viable surface. On the right posterior calf he has a single wound with a clean surface. He has a wound with exposed bone on the PIP of the left second toe dorsally. He has wounds on the dorsal right first second and third toes. His arterial studies were normal. 1/19; the patient has 3 open wounds on the left leg anteriorly in the mid tibia, distally and medially just above the ankle and posteriorly. On the right he has a small area dime sized on the right posterior calf. His edema control is a lot better. He still has wounds on the right second and left second toes. The left second toe has exposed bone. X-ray I did last week did not show evidence of osteomyelitis in the left foot. 1/26; patient with a multiplicity of wounds and problems. On the left he has a circular area on the left anterior mid tibia Left just above the medical ankle -left 2nd toe pip -right 2nd toe right posterior calf 2/2; patient with a multiplicity of wounds and problems. He has severe chronic venous insufficiency and the wounds on his leg are all on the left left anterior left medial at the medial malleolus and left posterior. We have been using Iodoflex to these areas to help with ongoing debridement. He also has largely traumatic wounds on his toes this includes the left second with exposed bone. Bone culture I did last week showed Staph aureus which is methicillin sensitive. I discussed with him today the idea of an amputation of this toe because it is literally nonfunctional however he wants to try antibiotics. Antibiotic choice is complicated by the fact that he is on Coumadin. He also has wounds over the dorsal part of the right first which is close to closed. Right second toe has exposed bone and the right third toe at the base of the right third toe is just about closed as well. We have been using silver alginate 2/9; patient with severe chronic venous insufficiency. He has large wounds on the left anterior mid  tibia and on the left medial just above the ankle. I am concerned today about the depth of the area on the left mid tibia may be exposing into the muscle. He has small areas on the left posterior calf and on the right posterior calf although these do not look as threatening. He also has traumatic wounds on his toes. The right first and third are closed however the bilateral second toes have exposed bone. The area on the left is open into the distal interphalangeal joint. In my opinion these toes are nonsalvageable and will need amputation 2/16; patient with severe chronic venous insufficiency. He has wounds on the left anterior mid tibia left medial lower leg just above the ankle. He has small areas on the left posterior calf and a more prominent area on the right posterior calf. All of these are related to poorly controlled venous hypertension. He also had traumatic wounds on the PIPs of both second toes. Unfortunately both of these have exposed bone and in the case  of the left this I think goes right into the joint itself. I do not think either 1 of these is salvageable. I have reviewed his arterial evaluation that was done in July last year. He also saw Dr. [name] in June. He felt these were venous stasis. Previously had segmental arterial pressures performed in 11/03/2014 which were normal. He was sent for for a follow-up arterial evaluation on July 1. On the right his ABIs were 1.03 with a TBI of 0.86 although his waveforms were monophasic on the left he had multiphasic waveforms at the ATA PTA but monophasic at the peroneal nevertheless his ABI was normal at 1.06 TBI also normal at 0.84. I think he has enough blood flow to heal an amputation which I think he needs of the left second toe and probably the right second toe as well 2/23; the patient is going for his bilateral second toe amputation by Dr. Amalia Hailey on Friday. In the meantime is 3 areas on the left leg and the small area on the right  posterior look better. We have been using silver collagen. 3/2; the patient's toe amputations were canceled because of cardiac concerns. Apparently this surgery will need to be done in the hospital although they do not have an appointment. In the meantime he is continues to have 3 areas on the left leg one anteriorly and 2 smaller areas posteriorly on the left as well as the small posterior area on the right. We have been using silver collagen with compression 3/9; still no word on the second toe amputations bilaterally. 3 wounds on the left leg all look better and the area on the right posterior we have been using silver collagen I change this to [facility] 3/16; amputations next Monday both second toes he is apparently going to be hospitalized. He has on his left leg including left anterior and left lateral look better very superficial also the right posterior. 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 He continues to have left leg wounds anteriorly x2 posteriorly x1 at the left lower leg just above the ankle anteriorly x1 and posteriorly on the right x1 3/30; his anterior left leg wounds are considerably smaller. In fact the only wound that is not smaller is on the right. We have been using Hydrofera Blue under Kerlix Cobax 4/6; his anterior left leg wounds continue to contract in fact the area distally and medially has closed over. He still has the area anteriorly on the left. Both posterior calf wounds on the left and right are much smaller. We saw his surgical wound at the left second toe amputation site that still sutured it looks healthy, this is being followed by his podiatrist. We went over what compression he has he has bilateral juxta lites I am hopeful the issue here was that we just did not have them tight enough because he broke down very quickly the last time we healed him  out. 4/13; the patient has 3 remaining wounds one small area on the left anterior lower leg one on the left posterior and one on the right anterior. We have been using Hydrofera Blue under compression compression all of these are contracting. We did discuss life in the eventuality he will totally closed which seems likely in the next week or 2. He has bilateral juxta lite stockings. These will need to be set at 30-40 compression.  Since we last closed him over for his bilateral lower extremity wounds he is also been treated for heart failure which may help. He undoubtedly has severe chronic venous hypertension however as well 4/19; the area on the left anterior tibial area is closed. The area on the right posterior calf is just above closed. The area on the left posterior calf has some depth. 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 Everything looks quite good here. He does not need a specific dressing 4/27; the right posterior leg wound is closed there is nothing open on the right. Still has a small punched out area on the left posterior calf. He will be transitioned into the juxta lite stocking on the right today. Still wrapping the left leg Electronic Signature(s) Signed: 01/23/2020 5:41:23 PM By: [name] MD Entered By: [name] on 01/23/2020 08:56:35 -------------------------------------------------------------------------------- Physical Exam Details Patient Name: Date of Service: Daniel Pia D. 01/23/2020 8:00 AM Medical Record [id number] Patient Account Number: [id number] Date of Birth/Sex: Treating RN: 1941-03-01 (78 y.o. [name] Primary Care Provider: Jeralyn Ruths Other Clinician: Referring Provider: Treating Provider/Extender:Edrik Rundle, Donneta Romberg, [facility] Weeks in Treatment:  16 Constitutional Sitting or standing Blood Pressure is within target range for patient.. Pulse regular and within target range for patient.Marland Kitchen Respirations regular, non-labored and within target range.. Temperature is normal and within the target range for the patient.Marland Kitchen Appears in no distress. Cardiovascular Pedal pulses are palp. Notes Wound exam; all the wounds are improved. The only remaining wound is on the left posterior calf. Still a small wound but punched out. Using a #3 curette I removed skin and subcutaneous tissue from the margins of the wound to get this to a flat surface. As a result the wound is larger but looks healthy. There is no surrounding infection Electronic Signature(s) Signed: 01/23/2020 5:41:23 PM By: [name] MD Entered By: [name] on 01/23/2020 08:57:35 -------------------------------------------------------------------------------- Physician Orders Details Patient Name: Date of Service: Daniel Pia D. 01/23/2020 8:00 AM Medical Record [id number] Patient Account Number: [id number] Date of Birth/Sex: Treating RN: 05/21/1941 (78 y.o. [name]) [name] Primary Care Provider: Jeralyn Ruths Other Clinician: Referring Provider: Treating Provider/Extender:Aniza Shor, Donneta Romberg, [facility] Weeks in Treatment: 71 Verbal / Phone Orders: No Diagnosis Coding ICD-10 Coding Code Description L97.211 Non-pressure chronic ulcer of right calf limited to breakdown of skin L97.221 Non-pressure chronic ulcer of left calf limited to breakdown of skin I87.333 Chronic venous hypertension (idiopathic) with ulcer and inflammation of bilateral lower extremity E11.51 Type 2 diabetes mellitus with diabetic peripheral angiopathy without gangrene E11.42 Type 2 diabetes mellitus with diabetic polyneuropathy L97.524 Non-pressure chronic ulcer of other part of left foot with necrosis of bone L97.511 Non-pressure chronic ulcer of other part of right foot limited to breakdown of  skin O70.96 Chronic systolic (congestive) heart failure G83.662 Other chronic osteomyelitis, left ankle and foot Follow-up Appointments Return Appointment in 1 week. - Tuesday **********EXTRA TIME**************** Dressing Change Frequency Wound #25 Left,Distal,Posterior Lower Leg Do not change entire dressing for one week. Skin Barriers/Peri-Wound Care TCA Cream or Ointment Wound Cleansing May shower with protection. Primary Wound Dressing Wound #25 Left,Distal,Posterior Lower Leg Silver Collagen Secondary Dressing Dry Gauze - secure toes with tape Edema Control Wound #25 Left,Distal,Posterior Lower Leg Kerlix and Coban - Left Lower Extremity Support Garment 20-30 mm/Hg pressure to: - right lower leg, apply lotion  daily with special attention to ankle area Off-Loading Open toe surgical shoe to: - left and right foot Electronic Signature(s) Signed: 01/23/2020 5:28:23 PM By: [name] RN Signed: 01/23/2020 5:41:23 PM By: [name] MD Entered By: [name] on 01/23/2020 08:32:03 -------------------------------------------------------------------------------- Problem List Details Patient Name: Date of Service: Daniel Pia D. 01/23/2020 8:00 AM Medical Record [id number] Patient Account Number: [id number] Date of Birth/Sex: Treating RN: 1941/05/17 (78 y.o. [name]) Dolores Lory, [place] Primary Care Provider: Jeralyn Ruths Other Clinician: Referring Provider: Treating Provider/Extender:Camille Thau, Donneta Romberg, [facility] Weeks in Treatment: 11 Active Problems ICD-10 Encounter Code Description Active Date MDM Diagnosis L97.211 Non-pressure chronic ulcer of right calf limited to 02/28/2019 No Yes breakdown of skin L97.221 Non-pressure chronic ulcer of left calf limited to 02/28/2019 No Yes breakdown of skin I87.333 Chronic venous hypertension (idiopathic) with ulcer and 02/28/2019 No Yes inflammation of bilateral lower extremity E11.51 Type 2 diabetes mellitus with diabetic peripheral  02/28/2019 No Yes angiopathy without gangrene E11.42 Type 2 diabetes mellitus with diabetic polyneuropathy 02/28/2019 No Yes L97.524 Non-pressure chronic ulcer of other part of left foot with 10/10/2019 No Yes necrosis of bone L97.511 Non-pressure chronic ulcer of other part of right foot 09/26/2019 No Yes limited to breakdown of skin P53.00 Chronic systolic (congestive) heart failure 10/05/2019 No Yes M86.672 Other chronic osteomyelitis, left ankle and foot 10/31/2019 No Yes Inactive Problems ICD-10 Code Description Active Date Inactive Date B35.3 Tinea pedis 09/05/2019 09/05/2019 L97.521 Non-pressure chronic ulcer of other part of left foot limited to 09/26/2019 09/26/2019 breakdown of skin Resolved Problems Electronic Signature(s) Signed: 01/23/2020 5:41:23 PM By: [name] MD Entered By: [name] on 01/23/2020 08:55:24 -------------------------------------------------------------------------------- Progress Note Details Patient Name: Date of Service: Daniel Pia D. 01/23/2020 8:00 AM Medical Record [id number] Patient Account Number: [id number] Date of Birth/Sex: Treating RN: 02-21-1941 (78 y.o. [name] Primary Care Provider: Jeralyn Ruths Other Clinician: Referring Provider: Treating Provider/Extender:Shanaia Sievers, Donneta Romberg, [facility] Weeks in Treatment: 47 Subjective History of Present Illness (HPI) The following HPI elements were documented for the patient's wound: Location: Patient presents with a wound to left lower leg. Quality: Patient reports No Pain. Duration: 2 months no cig or alcohol. spontaneous appearance in area of stasis dermamtitis. [name] on metformin only. chronic afib on Coumadin. diabetes and coag studies not good. hba1c 7.5. ivr 4.5. no pain or sxs of systemic disease. hx chf. no intermittent claudication 02/28/2019 Readmission This is a now a 79 year old man who was previously cared for in 2016 by Dr. Lindon Romp for wounds on his lower extremities.  At that point he had venous reflux studies although I cannot seem to open these in [place] link. He had arterial studies showing an ABI of 1.11 on the right and 1.27 on the left his waveforms were triphasic bilaterally. He was discharged in stockings although I do not believe he is wearing these in some time. He tells me that about a month ago he noted openings of a large wound on the posterior right calf and 2 smaller areas on the left lateral calf and a small area more recently on the left posterior calf. He has been dressing these with peroxide and triple antibiotic ointment. He is not wearing compression. Past medical history; type 2 diabetes with peripheral neuropathy, chronic venous insufficiency, hypertension, cardiomyopathy, chronic atrial fibrillation on Coumadin, prostate cancer, hyperlipidemia, gout, ABI in our clinic was 1.34 on the left and not obtainable on the right 6/9; this is a patient who has chronic venous insufficiency. He has a fairly substantial  area on the right posterior calf, left lateral calf and a small area on the left posterior calf. On arrival last week he had very palpable popliteal and femoral pulses but nothing in his bilateral feet. Unfortunately we cannot get arterial studies until July 1 at Dr. Kennon Holter office. They live in [place]. We use silver alginate under Kerlix Coban 6/16; patient with chronic venous insufficiency with wounds on his bilateral lower extremities. When he came into our clinic he was discovered to have a complete absence of peripheral pedal pulses at either the dorsalis pedis or posterior tibial. He does have easily palpable femoral and popliteal pulses. He sees Dr. [name] tomorrow for noninvasive arterial tests. He may also require venous reflux evaluation although I do not view this as an urgent thing. We have been using silver alginate. His wound surfaces of cleaned up quite nicely 6/23; patient with chronic venous insufficiency with  wounds on his bilateral lower extremities. His wounds all are somewhat better looking. He did go to Dr. Kennon Holter office but somehow ended up on the doctors schedule rather than being scheduled for noninvasive tests therefore his noninvasive tests are scheduled for July 1. We agree that he has venous insufficiency ulcers but I cannot feel any pulses in his lower extremities dictating the need for test. We are only using Kerlix and light Coban unfortunately this appears to be holding the edema 6/30; has his arterial studies tomorrow. We have been using Kerlix and light Coban will go to a more aggressive compression if the arterial studies will allow. We all agreed these are venous wounds however I cannot feel pulses at either the dorsalis pedis or posterior tibial bilaterally. His wounds generally look some better including left lateral and right posterior. 7/7-Patient returns at 1 week in Kerlix/Coban to both legs, with improvement, in the left lateral and right posterior lower leg wounds, ABI's are normal in both legs per vascular studies, TBI is also normal on both sides, we are using hydrofera blue to the wounds 7/14; patient's arterial studies from 2 weeks ago showed an ABI on the right at 1.03 with a TBI of 0.86. On the left the ABI was 1.06 with a TBI of 0.84. Notable for the fact that his arterial waveforms were monophasic in all of the lower extremity arteries suggesting some degree of arterial occlusive disease but in general this was felt to be fairly adequate for healing. His compression was increased from 2-3 layer which is appropriate. Dressing was changed to [facility] 7/21; patient's wounds are measuring smaller. The more substantial one on the right posterior calf, second 1 on the left lateral calf. Using [facility] on both wound areas 7/28; patient continues to make nice improvements. The area on the right posterior calf is smaller. Area on the left lateral calf also is  smaller. We have been using Hydrofera Blue under compression. The patient will need compression stockings and we have measured him for these in the eventuality that these heal which really should not be too long from now 8/4-Patient continues to make improvement, the right posterior calf area smaller with rim of keratotic skin on one side, the left wound is definitely smaller and improving. 8/11-Returns at 1 week, after being in 3 layer compression on both legs, both wounds appear to be improving, making good progress, patient is happy, pain is also less especially in the right leg wound 8/18; the area on the left anterior lower leg is healed. On the right posterior leg the  wound remains although the dimensions are a lot better. 8/25; he arrives in clinic today with a large body of open wound on the left lateral calf. All of the 3 wounds in this area are in close juxtaposition to each other. The story is that we discharged him last week with no a wrap on the left leg. They went to [facility] could not get in as they are only excepting phone orders or online orders for stockings hence they did not put any stocking on the left leg all week. They have something at home but the patient with that was either incapable or just did not put them on. Apparently these opened 1 morning after getting out of bed. The area on the right has no real change 9/1; patient has bilateral lower extremity wounds in the setting of severe chronic venous insufficiency and secondary lymphedema. He arrived last week with new areas on the left lateral lower leg after we did not wrap him and he did not use his stockings. Nevertheless the areas on the left look better today under compression. Posterior right calf does not really changed. We are using Hydrofera Blue on both areas under compression 9/15; bilateral lower extremity wounds in the setting of severe chronic venous insufficiency and secondary lymphedema. He has 20 to 30  mmHg below-knee compression stockings under the eventuality that these close over. We did get the left leg to close but he did not transition to a stocking and this reopened. There are 2 open areas on the left posterior lateral calf and one on the right. Both of these look satisfactory. Using [facility] 9/22; bilateral lower extremity wounds in the setting of chronic venous insufficiency. 2 superficial areas on the left lateral calf. One on the right just above the Achilles area. We have good edema control we have been using Hydrofera Blue 9/29; the areas on the left lateral calf are healed. On the right just above the Achilles and tendon area things look a lot better small wounds one scabbed area. We have been using Hydrofera Blue. We can discharge him in his own stocking on the left still wrapping on the right. This is the second time we have healed the left leg but he did not put a stocking on last time. Hopefully this will maintain the edema from chronic venous disease with secondary lymphedema 10/6; he comes in today having a stocking on the left leg. They had trouble getting it on there is a lot of increase in swelling 2 small open areas one anteriorly and one on the medial calf. They report a lot of difficulty getting the stocking on. ooParadoxically the area on the right that we have been wrapping posteriorly is closed 10/13; he comes in today with wounds bilaterally including superficial areas on the left medial and left lateral calf. As well as the right posterior has reopened in the Achilles area superiorly. He still does not have his juxta lite stockings although truthfully we would not of been able to use them today anyway. Apparently have been ordered and paid for from [facility] although they have not been delivered 10/20; his area on the right is just the boat closed on the right posterior. Still has the area on the left lateral and a very tiny area on the left medial. He has his  bilateral juxta lites although he is not ready for them this week. He tolerated the increase to 4 layer compression last week quite well 10/27; the area on  the right posterior calf is once again closed. He has a superficial area on the left lateral calf that is still open. He has been using Hydrofera Blue and bilateral 4-layer compression. He can change to his own juxta lite stocking on the right and we are instructing him today 11/3; the area on the right posterior calf reopened according to the patient and his wife after they took off the stocking when they got home last week. Apparently scabbed over there is now a fairly substantial wound which looks pretty much the same. Our intake nurse noted that they were using the juxta lite stockings appropriately. I was really hoping I might be able to close him out today. He has 1 very tiny remaining area on the left lateral lower leg. 11/10; right posterior calf wound measures smaller but is still open. We have been using Hydrofera Blue. On the left he has a small oval-shaped wound and he seems to have had another wound distally that is open and likely a blister. We are using Hydrofera Blue under compression 11/17; right posterior calf wound continues to get better. We have been using Hydrofera Blue. On the left lateral one of the wounds has closed still a small open area. We have been using Hydrofera Blue on this as well. Both areas have been under 4-layer compression Arrives in clinic today with some swelling in the dorsal foot on the right some erythema of his forefoot and toes. Initially when I looked at this I almost thought this was a sunburn distal to a wrap injury. 12/1; right posterior calf wound debrided with a curette. We have been using Hydrofera Blue on the left anterior lateral he has an area across the mid tibia. Finally a small area on the left lateral lower calf. Finally he continues to have de- epithelialized areas on the dorsal  aspect of his toes. Initially thought this might be a burn injury when I saw him 2 weeks ago. I now wonder about tinea. I have also reviewed his arterial studies which were really quite good in July/20 with normal TBI's and ABIs but monophasic waveforms 12/8; comes in today with worsening problems especially on the left leg where he now has a cluster of wounds in the left anterior mid tibia. Very poor edema control. I reduced him to 3 layer from 4 layer compression last week because of some concern about blood flow to his toes however he does not have good edema control on the left leg. Right leg edema control looks satisfactory. ooOn the left he has a cluster of wounds anteriorly, small area on the left medial fifth met head and then the collection of areas on his toes which appear better ooOn the right he has the original area on the right posterior calf, a new area right medially. His formal arterial studies from mid July are noted below. He was evaluated by Dr.Berry ABI Findings: +---------+------------------+-----+----------+--------+ Right Rt Pressure (mmHg)IndexWaveform Comment  +---------+------------------+-----+----------+--------+ Brachial 176     +---------+------------------+-----+----------+--------+ ATA 176 0.99 monophasic  +---------+------------------+-----+----------+--------+ PTA 183 1.03 monophasic  +---------+------------------+-----+----------+--------+ PERO 172 0.97 monophasic  +---------+------------------+-----+----------+--------+ Great Toe153 0.86    +---------+------------------+-----+----------+--------+ +---------+------------------+-----+-----------+-------+ Left Lt Pressure (mmHg)IndexWaveform Comment +---------+------------------+-----+-----------+-------+ Brachial 178     +---------+------------------+-----+-----------+-------+ ATA 162 0.91 multiphasic   +---------+------------------+-----+-----------+-------+ PTA 188 1.06 multiphasic  +---------+------------------+-----+-----------+-------+ PERO 158 0.89 monophasic   +---------+------------------+-----+-----------+-------+ Great Toe150 0.84    +---------+------------------+-----+-----------+-------+ +-------+-----------+-----------+------------+------------+ ABI/TBIToday's ABIToday's TBIPrevious ABIPrevious TBI +-------+-----------+-----------+------------+------------+ Right 1.03 0.86 1.11   +-------+-----------+-----------+------------+------------+ Left 1.06 0.84 1.27   +-------+-----------+-----------+------------+------------+  Tibial waveforms somewhat difficult to record due to irregular heartbeat. Bilateral ABIs appear essentially unchanged compared to prior study on 06/21/15. Summary: Right: Resting right ankle-brachial index is within normal range. No evidence of significant right lower extremity arterial disease. The right toe-brachial index is normal. Although ankle brachial indices are within normal limits (0.95-1.29), arterial Doppler waveforms at the ankle suggest some component of arterial occlusive disease. Left: Resting left ankle-brachial index is within normal range. No evidence of significant left lower extremity arterial disease. The left toe-brachial index is normal. Although ankle brachial indices are within normal limits (0.95-1.29), arterial Doppler waveforms at the ankle suggest some component of arterial occlusive disease. 12/15; the patient's area on the left mid tibia looks better. Right posterior calf also better. He has the area on the left foot as well. All of his toes look better I think this was tinea. We are using Hydrofera Blue everywhere else The patient was in urgent care yesterday with wheezing and shortness of breath. He got azithromycin and prednisone. He feels better. His lungs are currently clear to  auscultation. He was not tested for Covid 19 12/29; the patient arrives in clinic today with quite a bit change. 2 weeks ago he only had areas on the left mid tibia right posterior calf with tinea pedis resolving between his toes. He arrives in clinic today with several areas on the dorsal toes on the right, dorsal left second toe. He has skin breakdown in the left medial calf probably from excess edema. Small area proximally in the medial calf. He has weeping edema fluid coming out of the skin excoriations on the left medial calf. He tells me that he is having a cardiac catheterization next week. I had a quick look at [facility] health link. He was found to have an ejection fraction of 25% during the work-up for persistent atrial fibrillation. He saw his cardiology office yesterday seen by the nurse practitioner. She increased his carvedilol. He has not been on diuretics for apparently several months and indeed in the nurse practitioner Dietrich Pates notes she had knowledge of this. 10/04/2019 on evaluation today patient presents as a walk-in visit concerning the fact that he did not have an appointment here for our clinic at this point. He actually had a cardiac catheterization earlier today and then came from there to here in order to be evaluated. With that being said unfortunately he is having significant cellulitis of his left lower extremity upon evaluation today this appears to have deteriorated even since last week's evaluation with Dr. Dellia Nims. The right lower extremity is actually doing okay I really see no evidence of deterioration at this point at those locations. In fact the right leg seems in general be doing quite well. Nonetheless I am concerned about infection and cellulitis of the left lower extremity and again considering his weakened heart I do not want him to develop into sepsis at all. He is also having some trouble breathing today and I understand according to nursing staff this is  always the case to some degree. With that being said the patient unfortunately seems to be in my opinion a little bit worse even his wife feels like that may be the case today. Unsure exactly what is leading to this. Cardiac catheterization I did review the report which showed an ejection fraction of 25% he also had an LAD blockage of around 25% based on what I saw. With that being said there was no significant blockages that required stenting  at this point he does have weakened cardiac muscles compared to normal. 10/05/2019; patient was seen yesterday in clinic. He was sent to the ER because of cellulitis of the left leg possibility. In the ER he was given 1 dose of IV Levaquin and discharged on Keflex. He came in the clinic initially for a nurse visit to rewrap his left leg. We did not look at the right leg today that is an Haematologist. The patient also had a cardiac cath. According to him there were no blockages but a very low ejection fraction. 1/12; back for an early follow-up. The condition of the left lower leg is a lot better although there are multiple open areas. All of them with not a particularly viable surface. On the right posterior calf he has a single wound with a clean surface. He has a wound with exposed bone on the PIP of the left second toe dorsally. He has wounds on the dorsal right first second and third toes. His arterial studies were normal. 1/19; the patient has 3 open wounds on the left leg anteriorly in the mid tibia, distally and medially just above the ankle and posteriorly. On the right he has a small area dime sized on the right posterior calf. His edema control is a lot better. He still has wounds on the right second and left second toes. The left second toe has exposed bone. X-ray I did last week did not show evidence of osteomyelitis in the left foot. 1/26; patient with a multiplicity of wounds and problems. ooOn the left he has a circular area on the left anterior  mid tibia ooLeft just above the medical ankle -left 2nd toe pip -right 2nd toe right posterior calf 2/2; patient with a multiplicity of wounds and problems. He has severe chronic venous insufficiency and the wounds on his leg are all on the left left anterior left medial at the medial malleolus and left posterior. We have been using Iodoflex to these areas to help with ongoing debridement. He also has largely traumatic wounds on his toes this includes the left second with exposed bone. Bone culture I did last week showed Staph aureus which is methicillin sensitive. I discussed with him today the idea of an amputation of this toe because it is literally nonfunctional however he wants to try antibiotics. Antibiotic choice is complicated by the fact that he is on Coumadin. He also has wounds over the dorsal part of the right first which is close to closed. Right second toe has exposed bone and the right third toe at the base of the right third toe is just about closed as well. We have been using silver alginate 2/9; patient with severe chronic venous insufficiency. He has large wounds on the left anterior mid tibia and on the left medial just above the ankle. I am concerned today about the depth of the area on the left mid tibia may be exposing into the muscle. He has small areas on the left posterior calf and on the right posterior calf although these do not look as threatening. He also has traumatic wounds on his toes. The right first and third are closed however the bilateral second toes have exposed bone. The area on the left is open into the distal interphalangeal joint. In my opinion these toes are nonsalvageable and will need amputation 2/16; patient with severe chronic venous insufficiency. He has wounds on the left anterior mid tibia left medial lower leg just above the ankle.  He has small areas on the left posterior calf and a more prominent area on the right posterior calf. All of  these are related to poorly controlled venous hypertension. He also had traumatic wounds on the PIPs of both second toes. Unfortunately both of these have exposed bone and in the case of the left this I think goes right into the joint itself. I do not think either 1 of these is salvageable. I have reviewed his arterial evaluation that was done in July last year. He also saw Dr. [name] in June. He felt these were venous stasis. Previously had segmental arterial pressures performed in 11/03/2014 which were normal. He was sent for for a follow-up arterial evaluation on July 1. On the right his ABIs were 1.03 with a TBI of 0.86 although his waveforms were monophasic on the left he had multiphasic waveforms at the ATA PTA but monophasic at the peroneal nevertheless his ABI was normal at 1.06 TBI also normal at 0.84. I think he has enough blood flow to heal an amputation which I think he needs of the left second toe and probably the right second toe as well 2/23; the patient is going for his bilateral second toe amputation by Dr. Amalia Hailey on Friday. In the meantime is 3 areas on the left leg and the small area on the right posterior look better. We have been using silver collagen. 3/2; the patient's toe amputations were canceled because of cardiac concerns. Apparently this surgery will need to be done in the hospital although they do not have an appointment. In the meantime he is continues to have 3 areas on the left leg one anteriorly and 2 smaller areas posteriorly on the left as well as the small posterior area on the right. We have been using silver collagen with compression 3/9; still no word on the second toe amputations bilaterally. 3 wounds on the left leg all look better and the area on the right posterior we have been using silver collagen I change this to [facility] 3/16; amputations next Monday both second toes he is apparently going to be hospitalized. He has on his left leg including left  anterior and left lateral look better very superficial also the right posterior. 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 He continues to have left leg wounds anteriorly x2 posteriorly x1 at the left lower leg just above the ankle anteriorly x1 and posteriorly on the right x1 3/30; his anterior left leg wounds are considerably smaller. In fact the only wound that is not smaller is on the right. We have been using Hydrofera Blue under Kerlix Cobax 4/6; his anterior left leg wounds continue to contract in fact the area distally and medially has closed over. He still has the area anteriorly on the left. Both posterior calf wounds on the left and right are much smaller. We saw his surgical wound at the left second toe amputation site that still sutured it looks healthy, this is being followed by his podiatrist. We went over what compression he has he has bilateral juxta lites I am hopeful the issue here was that we just did not have them tight enough because he broke down very quickly the last time we healed him out. 4/13; the patient has 3 remaining wounds one small area on the left anterior lower leg one on the left posterior and one on  the right anterior. We have been using Hydrofera Blue under compression compression all of these are contracting. We did discuss life in the eventuality he will totally closed which seems likely in the next week or 2. He has bilateral juxta lite stockings. These will need to be set at 30-40 compression. Since we last closed him over for his bilateral lower extremity wounds he is also been treated for heart failure which may help. He undoubtedly has severe chronic venous hypertension however as well 4/19; the area on the left anterior tibial area is closed. The area on the right posterior calf is just above closed. The area on the left posterior calf has some  depth. 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 Everything looks quite good here. He does not need a specific dressing 4/27; the right posterior leg wound is closed there is nothing open on the right. Still has a small punched out area on the left posterior calf. He will be transitioned into the juxta lite stocking on the right today. Still wrapping the left leg Objective Constitutional Sitting or standing Blood Pressure is within target range for patient.. Pulse regular and within target range for patient.Marland Kitchen Respirations regular, non-labored and within target range.. Temperature is normal and within the target range for the patient.Marland Kitchen Appears in no distress. Vitals Time Taken: 8:00 AM, Height: 74 in, Weight: 212 lbs, BMI: 27.2, Temperature: 97.8 F, Pulse: 83 bpm, Respiratory Rate: 18 breaths/min, Blood Pressure: 122/67 mmHg. Cardiovascular Pedal pulses are palp. General Notes: Wound exam; all the wounds are improved. The only remaining wound is on the left posterior calf. Still a small wound but punched out. Using a #3 curette I removed skin and subcutaneous tissue from the margins of the wound to get this to a flat surface. As a result the wound is larger but looks healthy. There is no surrounding infection Integumentary (Hair, Skin) Wound #25 status is Open. Original cause of wound was Gradually Appeared. The wound is located on the Left,Distal,Posterior Lower Leg. The wound measures 0.8cm length x 0.4cm width x 0.2cm depth; 0.251cm^2 area and 0.05cm^3 volume. There is Fat Layer (Subcutaneous Tissue) Exposed exposed. There is no tunneling or undermining noted. There is a small amount of serosanguineous drainage noted. The wound margin is flat and intact. There is no granulation within the wound bed. There is no necrotic  tissue within the wound bed. General Notes: fibrin noted over wound bed. Wound #3R status is Healed - Epithelialized. Original cause of wound was Gradually Appeared. The wound is located on the Right,Posterior Calf. The wound measures 0cm length x 0cm width x 0cm depth; 0cm^2 area and 0cm^3 volume. 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 There was a Excisional Skin/Subcutaneous Tissue Debridement with a total area of 0.32 sq cm performed by Ricard Dillon., MD. With the following instrument(s): Curette to remove Viable and Non-Viable tissue/material. Material removed includes Subcutaneous Tissue, Slough, Skin: Dermis, and Skin: Epidermis after achieving pain control using Lidocaine 5% topical ointment. No specimens were taken. A time out was conducted at 08:27, prior to the start of the procedure. A Moderate amount of bleeding was controlled with Pressure. The procedure was tolerated well with a pain level of 0 throughout and a pain level of 0 following the procedure. Post Debridement Measurements: 0.8cm length x 0.4cm width x 0.2cm depth; 0.05cm^3 volume. Character of Wound/Ulcer Post Debridement  is improved. Severity of Tissue Post Debridement is: Fat layer exposed. Post procedure Diagnosis Wound #25: Same as Pre-Procedure Plan Follow-up Appointments: Return Appointment in 1 week. - Tuesday **********EXTRA TIME**************** Dressing Change Frequency: Wound #25 Left,Distal,Posterior Lower Leg: Do not change entire dressing for one week. Skin Barriers/Peri-Wound Care: TCA Cream or Ointment Wound Cleansing: May shower with protection. Primary Wound Dressing: Wound #25 Left,Distal,Posterior Lower Leg: Silver Collagen Secondary Dressing: Dry Gauze - secure toes with tape Edema Control: Wound #25 Left,Distal,Posterior Lower Leg: Kerlix and Coban - Left Lower Extremity Support Garment 20-30 mm/Hg pressure to: - right lower leg, apply lotion daily with special attention to ankle area Off-Loading: Open toe surgical shoe to: - left and right foot 1. Silver collagen to the left posterior lower leg still with Kerlix and Coban 2. Juxta light to the right leg he was carefully shown how to apply this 2. Juxta light to the right leg he was carefully shown how to apply this 3. We heal this man bilaterally once before I believe to juxta lite stockings it did not go well hopefully will do better this time Electronic Signature(s) Signed: 01/23/2020 5:41:23 PM By: [name] MD Entered By: [name] on 01/23/2020 08:58:30 -------------------------------------------------------------------------------- SuperBill Details Patient Name: Date of Service: [name] 01/23/2020 Medical Record [id number] Patient Account Number: [id number] Date of Birth/Sex: Treating RN: December 12, 1940 (78 y.o. [name]) Dolores Lory, [place] Primary Care Provider: Jeralyn Ruths Other Clinician: Referring Provider: Treating Provider/Extender:Gemma Ruan, Donneta Romberg, [facility] Weeks in Treatment: 47 Diagnosis Coding ICD-10 Codes Code Description L97.211 Non-pressure chronic ulcer of right calf limited to  breakdown of skin L97.221 Non-pressure chronic ulcer of left calf limited to breakdown of skin I87.333 Chronic venous hypertension (idiopathic) with ulcer and inflammation of bilateral lower extremity E11.51 Type 2 diabetes mellitus with diabetic peripheral angiopathy without gangrene E11.42 Type 2 diabetes mellitus with diabetic polyneuropathy L97.524 Non-pressure chronic ulcer of other part of left foot with necrosis of bone L97.511 Non-pressure chronic ulcer of other part of right foot limited to breakdown of skin V74.82 Chronic systolic (congestive) heart failure L07.867 Other chronic osteomyelitis, left ankle and foot Facility Procedures The patient participates with Medicare or their insurance follows the Medicare Facility Guidelines: CPT4 Code Description Modifier Quantity 54492010 11042 - DEB SUBQ TISSUE 20 SQ CM/< 1 ICD-10 Diagnosis Description L97.221 Non-pressure chronic ulcer of  left calf limited to breakdown of skin Physician Procedures CPT4 Code Description: 0712197 11042 - WC PHYS SUBQ TISS 20 SQ CM ICD-10 Diagnosis Description L97.221 Non-pressure chronic ulcer of left calf limited to breakdo Modifier: wn of skin Quantity: 1 Electronic Signature(s) Signed: 01/23/2020 5:41:23 PM By: [name] MD Entered By: [name] on 01/23/2020 08:58:54

## 2020-01-25 ENCOUNTER — Other Ambulatory Visit: Payer: Self-pay

## 2020-01-25 ENCOUNTER — Ambulatory Visit: Payer: Medicare HMO | Admitting: Internal Medicine

## 2020-01-25 ENCOUNTER — Encounter: Payer: Self-pay | Admitting: Internal Medicine

## 2020-01-25 VITALS — BP 102/74 | HR 94 | Temp 97.5°F | Ht 74.0 in | Wt 196.6 lb

## 2020-01-25 DIAGNOSIS — I4821 Permanent atrial fibrillation: Secondary | ICD-10-CM

## 2020-01-25 DIAGNOSIS — I5022 Chronic systolic (congestive) heart failure: Secondary | ICD-10-CM

## 2020-01-25 DIAGNOSIS — I447 Left bundle-branch block, unspecified: Secondary | ICD-10-CM | POA: Diagnosis not present

## 2020-01-25 NOTE — Progress Notes (Signed)
HPI [name] is referred today by Dr. Jacinta Shoe for evaluation of and consideration for ICD insertion. He is a 79 yo man with longstanding atrial fib who has developed LBBB, and severe LV dysfunction despite maximal medical therapy. His EF by echo is 25%. He has not had syncope and does not experience palpitations. He does not have CAD. He does have venous insufficiency and his legs are wrapped. His ABI's however are good, greater than 1. He does not have HTN.  Allergies  Allergen Reactions  . Clarithromycin Other (See Comments)    "aches & pains all over", no appetite, sleepy  . Fluoride Preparations Other (See Comments)    unknown  . Toprol Xl [Metoprolol Tartrate]     Worsening Dyspnea with this in 08/2018     Current Outpatient Medications  Medication Sig Dispense Refill  . acetaminophen (TYLENOL) 500 MG tablet Take 500-1,000 mg by mouth every 6 (six) hours as needed for mild pain or headache.     . allopurinol (ZYLOPRIM) 300 MG tablet Take 1 tablet (300 mg total) by mouth 2 (two) times daily. 180 tablet 0  . ascorbic acid (VITAMIN C) 250 MG CHEW Chew 500 mg by mouth daily.     . carvedilol (COREG) 25 MG tablet Take 1.5 tablets (37.5 mg total) by mouth 2 (two) times daily. 270 tablet 3  . Cholecalciferol (VITAMIN D-3) 125 MCG (5000 UT) TABS Take 5,000 Units by mouth daily.     . furosemide (LASIX) 20 MG tablet Take 20 mg by mouth daily.    Marland Kitchen glipiZIDE (GLUCOTROL) 5 MG tablet Take 1 tablet (5 mg total) by mouth daily. 90 tablet 0  . HYDROcodone-acetaminophen (NORCO/VICODIN) 5-325 MG tablet Take 1 tablet by mouth every 6 (six) hours as needed for moderate pain. 30 tablet 0  . lovastatin (MEVACOR) 40 MG tablet Take 1 tablet (40 mg total) by mouth at bedtime. (Patient taking differently: Take 20 mg by mouth at bedtime. ) 90 tablet 1  . NON FORMULARY Take 3 tablets by mouth daily. MANGA-CAL    . oxyCODONE-acetaminophen (PERCOCET) 5-325 MG tablet Take 1 tablet by mouth every 6  (six) hours as needed for severe pain. 30 tablet 0  . sacubitril-valsartan (ENTRESTO) 24-26 MG Take 1 tablet by mouth 2 (two) times daily. 60 tablet 11  . spironolactone (ALDACTONE) 25 MG tablet Take 1 tablet (25 mg total) by mouth daily. 90 tablet 3  . vitamin E 400 UNIT capsule Take 400 Units by mouth daily.     Marland Kitchen warfarin (COUMADIN) 5 MG tablet Take 1 tablet (5 mg total) by mouth daily. 90 tablet 0   No current facility-administered medications for this visit.     Past Medical History:  Diagnosis Date  . Cancer ([place])   . CHF (congestive heart failure) ([place])   . Chronic atrial fibrillation ([place])   . Chronic bronchitis   . Diabetes mellitus, type II ([place])    no insulin  . Gout   . History of herpes zoster virus   . History of radiation therapy 12/21/12- 02/15/13   prostate 79 gray in 40 fx  . Hyperlipidemia   . Hypertension   . Prostate cancer ([place]) 2014   EBRT + hormonal therapy  . Vitamin D deficiency disease 06/07/2019    ROS:   All systems reviewed and negative except as noted in the HPI.   Past Surgical History:  Procedure Laterality Date  . AMPUTATION Bilateral 12/18/2019   Procedure: BILATERAL  2ND TOE AMPUTATION DIGIT;  Surgeon: Edrick Kins, DPM;  Location: [place];  Service: Podiatry;  Laterality: Bilateral;  . CATARACT EXTRACTION, BILATERAL    . KNEE SURGERY     left knee  . PROSTATE BIOPSY    . RIGHT/LEFT HEART CATH AND CORONARY ANGIOGRAPHY N/A 10/04/2019   Procedure: RIGHT/LEFT HEART CATH AND CORONARY ANGIOGRAPHY;  Surgeon: Belva Crome, MD;  Location: [place] [facility];  Service: Cardiovascular;  Laterality: N/A;     Family History  Problem Relation Age of Onset  . Kidney failure Mother   . Heart attack Father      Social History   Socioeconomic History  . Marital status: Married    Spouse name: Not on file  . Number of children: Not on file  . Years of education: Not on file  . Highest education level: Not on file  Occupational History  . Not  on file  Tobacco Use  . Smoking status: Never Smoker  . Smokeless tobacco: Never Used  Substance and Sexual Activity  . Alcohol use: No    Alcohol/week: 0.0 standard drinks  . Drug use: No  . Sexual activity: Not on file  Other Topics Concern  . Not on file  Social History Narrative  . Not on file   Social Determinants of Health   Financial Resource Strain:   . Difficulty of Paying Living Expenses:   Food Insecurity:   . Worried About Charity fundraiser in the Last Year:   . Arboriculturist in the Last Year:   Transportation Needs:   . Film/video editor (Medical):   Marland Kitchen Lack of Transportation (Non-Medical):   Physical Activity:   . Days of Exercise per Week:   . Minutes of Exercise per Session:   Stress:   . Feeling of Stress :   Social Connections:   . Frequency of Communication with Friends and Family:   . Frequency of Social Gatherings with Friends and Family:   . Attends Religious Services:   . Active Member of Clubs or Organizations:   . Attends Archivist Meetings:   Marland Kitchen Marital Status:   Intimate Partner Violence:   . Fear of Current or Ex-Partner:   . Emotionally Abused:   Marland Kitchen Physically Abused:   . Sexually Abused:      BP 102/74   Pulse 94   Temp (!) 97.5 F (36.4 C)   Ht 6\' 2"  (1.88 m)   Wt 196 lb 9.6 oz (89.2 kg)   SpO2 97%   BMI 25.24 kg/m   Physical Exam:  diskempt but well appearing NAD HEENT: Unremarkable Neck:  No JVD, no thyromegally Lymphatics:  No adenopathy Back:  No CVA tenderness Lungs:  Clear with no wheezes HEART:  Regular rate rhythm, no murmurs, no rubs, no clicks Abd:  soft, positive bowel sounds, no organomegally, no rebound, no guarding Ext:  2 plus pulses, no edema, no cyanosis, no clubbing Skin:  No rashes no nodules Neuro:  CN II through XII intact, motor grossly intact   EKG - reviewed - atrial fib with a CVR and LBBB   Assess/Plan: 1. Chronic systolic heart failure - He has class 2 symptoms and LBBB  with a QRS of 170. I have discussed the risks/benefits/goals/expectations of ICD insertion and he wishes to proceed. 2. Atrial fib - his rates are controlled. I suspect we will want to uptitrate his AV nodal blocking drugs when his ICD is in place.  Mikle Bosworth.D.

## 2020-01-25 NOTE — Patient Instructions (Signed)
Medication Instructions:  Your physician recommends that you continue on your current medications as directed. Please refer to the Current Medication list given to you today.  *If you need a refill on your cardiac medications before your next appointment, please call your pharmacy*   Lab Work: Your physician recommends that you return for lab work in: BMET , CBC   If you have labs (blood work) drawn today and your tests are completely normal, you will receive your results only by: Marland Kitchen MyChart Message (if you have MyChart) OR . A paper copy in the mail If you have any lab test that is abnormal or we need to change your treatment, we will call you to review the results.   Testing/Procedures: Your physician has recommended that you have a defibrillator inserted. An implantable cardioverter defibrillator (ICD) is a small device that is placed in your chest or, in rare cases, your abdomen. This device uses electrical pulses or shocks to help control life-threatening, irregular heartbeats that could lead the heart to suddenly stop beating (sudden cardiac arrest). Leads are attached to the ICD that goes into your heart. This is done in the hospital and usually requires an overnight stay. Please see the instruction sheet given to you today for more information.     Follow-Up: At [facility], you and your health needs are our priority.  As part of our continuing mission to provide you with exceptional heart care, we have created designated Provider Care Teams.  These Care Teams include your primary Cardiologist (physician) and Advanced Practice Providers (APPs -  Physician Assistants and Nurse Practitioners) who all work together to provide you with the care you need, when you need it.  We recommend signing up for the patient portal called "MyChart".  Sign up information is provided on this After Visit Summary.  MyChart is used to connect with patients for Virtual Visits (Telemedicine).  Patients are  able to view lab/test results, encounter notes, upcoming appointments, etc.  Non-urgent messages can be sent to your provider as well.   To learn more about what you can do with MyChart, go to [email or web address].    Your next appointment:    ICD Placement Dates June 1, 3, 10,11, 14   The format for your next appointment:   In Person  Provider:   You may see Dr. Lovena Le or one of the following Advanced Practice Providers on your designated Care Team:    Bernerd Pho, PA-C   Ermalinda Barrios, PA-C     Other Instructions Thank you for choosing [facility]!

## 2020-01-26 ENCOUNTER — Encounter: Payer: Self-pay | Admitting: *Deleted

## 2020-01-26 NOTE — Addendum Note (Signed)
Addended by: Levonne Hubert on: 01/26/2020 08:46 AM   Modules accepted: Orders

## 2020-01-30 ENCOUNTER — Encounter (HOSPITAL_BASED_OUTPATIENT_CLINIC_OR_DEPARTMENT_OTHER): Payer: Medicare HMO | Attending: Internal Medicine | Admitting: Internal Medicine

## 2020-01-30 ENCOUNTER — Other Ambulatory Visit (INDEPENDENT_AMBULATORY_CARE_PROVIDER_SITE_OTHER): Payer: Self-pay | Admitting: Internal Medicine

## 2020-01-30 DIAGNOSIS — I87333 Chronic venous hypertension (idiopathic) with ulcer and inflammation of bilateral lower extremity: Secondary | ICD-10-CM | POA: Insufficient documentation

## 2020-01-30 DIAGNOSIS — Z7901 Long term (current) use of anticoagulants: Secondary | ICD-10-CM | POA: Insufficient documentation

## 2020-01-30 DIAGNOSIS — L97221 Non-pressure chronic ulcer of left calf limited to breakdown of skin: Secondary | ICD-10-CM | POA: Insufficient documentation

## 2020-01-30 DIAGNOSIS — L97822 Non-pressure chronic ulcer of other part of left lower leg with fat layer exposed: Secondary | ICD-10-CM | POA: Diagnosis not present

## 2020-01-30 DIAGNOSIS — E1151 Type 2 diabetes mellitus with diabetic peripheral angiopathy without gangrene: Secondary | ICD-10-CM | POA: Diagnosis not present

## 2020-01-30 DIAGNOSIS — I482 Chronic atrial fibrillation, unspecified: Secondary | ICD-10-CM | POA: Insufficient documentation

## 2020-01-30 DIAGNOSIS — I429 Cardiomyopathy, unspecified: Secondary | ICD-10-CM | POA: Diagnosis not present

## 2020-01-30 DIAGNOSIS — M109 Gout, unspecified: Secondary | ICD-10-CM | POA: Diagnosis not present

## 2020-01-30 DIAGNOSIS — E1142 Type 2 diabetes mellitus with diabetic polyneuropathy: Secondary | ICD-10-CM | POA: Insufficient documentation

## 2020-01-30 DIAGNOSIS — I10 Essential (primary) hypertension: Secondary | ICD-10-CM | POA: Diagnosis not present

## 2020-01-30 DIAGNOSIS — I872 Venous insufficiency (chronic) (peripheral): Secondary | ICD-10-CM | POA: Diagnosis not present

## 2020-01-31 NOTE — Progress Notes (Signed)
[name] ([id number]) , Visit Report for 01/30/2020 Debridement Details Patient Name: Date of Service: [name], [name] 01/30/2020 8:00 A M Medical Record Number: [id number] Patient Account Number: [id number] Date of Birth/Sex: Treating RN: [birth year] (78 y.o. [name]) [name] Primary Care Provider: Leanord Asal, SA RA H Other Clinician: Referring Provider: Treating Provider/Extender: Candie Mile, SA RA H Weeks in Treatment: 48 Debridement Performed for Assessment: Wound #25 Left,Distal,Posterior Lower Leg Performed By: Physician Ricard Dillon., MD Debridement Type: Debridement Severity of Tissue Pre Debridement: Fat layer exposed Level of Consciousness (Pre-procedure): Awake and Alert Pre-procedure Verification/Time Out Yes - 08:32 Taken: Start Time: 08:32 Pain Control: Lidocaine 5% topical ointment T Area Debrided (L x W): otal 1 (cm) x 0.5 (cm) = 0.5 (cm) Tissue and other material debrided: Viable, Non-Viable, Slough, Skin: Dermis , Skin: Epidermis, Slough Level: Skin/Epidermis Debridement Description: Selective/Open Wound Instrument: Curette Bleeding: Minimum Hemostasis Achieved: Pressure End Time: 08:34 Procedural Pain: 0 Post Procedural Pain: 0 Response to Treatment: Procedure was tolerated well Level of Consciousness (Post- Awake and Alert procedure): Post Debridement Measurements of Total Wound Length: (cm) 1 Width: (cm) 0.5 Depth: (cm) 0.2 Volume: (cm) 0.079 Character of Wound/Ulcer Post Debridement: Improved Severity of Tissue Post Debridement: Fat layer exposed Post Procedure Diagnosis Same as Pre-procedure Electronic Signature(s) Signed: 01/30/2020 5:25:34 PM By: [name] MD Signed: 01/31/2020 4:37:47 PM By: [name] RN Entered By: [name] on 01/30/2020 08:49:17 -------------------------------------------------------------------------------- HPI Details Patient Name: Date of Service: Daniel Phenix D. 01/30/2020 8:00 A  M Medical Record Number: [id number] Patient Account Number: [id number] Date of Birth/Sex: Treating RN: 1941/08/27 (78 y.o. [name] Primary Care Provider: Leanord Asal, SA RA H Other Clinician: Referring Provider: Treating Provider/Extender: Candie Mile, SA RA H Weeks in Treatment: 48 History of Present Illness Location: Patient presents with a wound to left lower leg. Quality: Patient reports No Pain. Duration: 2 months HPI Description: no cig or alcohol. spontaneous appearance in area of stasis dermamtitis. [name] on metformin only. chronic afib on Coumadin. diabetes and coag studies not good. hba1c 7.5. ivr 4.5. no pain or sxs of systemic disease. hx chf. no intermittent claudication 02/28/2019 Readmission This is a now a 79 year old man who was previously cared for in 2016 by Dr. Lindon Gross for wounds on his lower extremities. At that point he had venous reflux studies although I cannot seem to open these in [place] link. He had arterial studies showing an ABI of 1.11 on the right and 1.27 on the left his waveforms were triphasic bilaterally. He was discharged in stockings although I do not believe he is wearing these in some time. He tells me that about a month ago he noted openings of a large wound on the posterior right calf and 2 smaller areas on the left lateral calf and a small area more recently on the left posterior calf. He has been dressing these with peroxide and triple antibiotic ointment. He is not wearing compression. Past medical history; type 2 diabetes with peripheral neuropathy, chronic venous insufficiency, hypertension, cardiomyopathy, chronic atrial fibrillation on Coumadin, prostate cancer, hyperlipidemia, gout, ABI in our clinic was 1.34 on the left and not obtainable on the right 6/9; this is a patient who has chronic venous insufficiency. He has a fairly substantial area on the right posterior calf, left lateral calf and a small area on the left posterior  calf. On arrival last week he had very palpable popliteal and femoral pulses but  nothing in his bilateral feet. Unfortunately we cannot get arterial studies until July 1 at Dr. Kennon Holter office. They live in [place]. We use silver alginate under Kerlix Coban 6/16; patient with chronic venous insufficiency with wounds on his bilateral lower extremities. When he came into our clinic he was discovered to have a complete absence of peripheral pedal pulses at either the dorsalis pedis or posterior tibial. He does have easily palpable femoral and popliteal pulses. He sees Dr. [name] tomorrow for noninvasive arterial tests. He may also require venous reflux evaluation although I do not view this as an urgent thing. We have been using silver alginate. His wound surfaces of cleaned up quite nicely 6/23; patient with chronic venous insufficiency with wounds on his bilateral lower extremities. His wounds all are somewhat better looking. He did go to Dr. Kennon Holter office but somehow ended up on the doctors schedule rather than being scheduled for noninvasive tests therefore his noninvasive tests are scheduled for July 1. We agree that he has venous insufficiency ulcers but I cannot feel any pulses in his lower extremities dictating the need for test. We are only using Kerlix and light Coban unfortunately this appears to be holding the edema 6/30; has his arterial studies tomorrow. We have been using Kerlix and light Coban will go to a more aggressive compression if the arterial studies will allow. We all agreed these are venous wounds however I cannot feel pulses at either the dorsalis pedis or posterior tibial bilaterally. His wounds generally look some better including left lateral and right posterior. 7/7-Patient returns at 1 week in Kerlix/Coban to both legs, with improvement, in the left lateral and right posterior lower leg wounds, ABI's are normal in both legs per vascular studies, TBI is also normal on both  sides, we are using hydrofera blue to the wounds 7/14; patient's arterial studies from 2 weeks ago showed an ABI on the right at 1.03 with a TBI of 0.86. On the left the ABI was 1.06 with a TBI of 0.84. Notable for the fact that his arterial waveforms were monophasic in all of the lower extremity arteries suggesting some degree of arterial occlusive disease but in general this was felt to be fairly adequate for healing. His compression was increased from 2-3 layer which is appropriate. Dressing was changed to [facility] 7/21; patient's wounds are measuring smaller. The more substantial one on the right posterior calf, second 1 on the left lateral calf. Using [facility] on both wound areas 7/28; patient continues to make nice improvements. The area on the right posterior calf is smaller. Area on the left lateral calf also is smaller. We have been using Hydrofera Blue under compression. The patient will need compression stockings and we have measured him for these in the eventuality that these heal which really should not be too long from now 8/4-Patient continues to make improvement, the right posterior calf area smaller with rim of keratotic skin on one side, the left wound is definitely smaller and improving. 8/11-Returns at 1 week, after being in 3 layer compression on both legs, both wounds appear to be improving, making good progress, patient is happy, pain is also less especially in the right leg wound 8/18; the area on the left anterior lower leg is healed. On the right posterior leg the wound remains although the dimensions are a lot better. 8/25; he arrives in clinic today with a large body of open wound on the left lateral calf. All of the 3 wounds  in this area are in close juxtaposition to each other. The story is that we discharged him last week with no a wrap on the left leg. They went to  could not get in as they are only excepting phone orders or online orders for  stockings hence they did not put any stocking on the left leg all week. They have something at home but the patient with that was either incapable or just did not put them on. Apparently these opened 1 morning after getting out of bed. The area on the right has no real change 9/1; patient has bilateral lower extremity wounds in the setting of severe chronic venous insufficiency and secondary lymphedema. He arrived last week with new areas on the left lateral lower leg after we did not wrap him and he did not use his stockings. Nevertheless the areas on the left look better today under compression. Posterior right calf does not really changed. We are using Hydrofera Blue on both areas under compression 9/15; bilateral lower extremity wounds in the setting of severe chronic venous insufficiency and secondary lymphedema. He has 20 to 30 mmHg below-knee compression stockings under the eventuality that these close over. We did get the left leg to close but he did not transition to a stocking and this reopened. There are 2 open areas on the left posterior lateral calf and one on the right. Both of these look satisfactory. Using [facility] 9/22; bilateral lower extremity wounds in the setting of chronic venous insufficiency. 2 superficial areas on the left lateral calf. One on the right just above the Achilles area. We have good edema control we have been using Hydrofera Blue 9/29; the areas on the left lateral calf are healed. On the right just above the Achilles and tendon area things look a lot better small wounds one scabbed area. We have been using Hydrofera Blue. We can discharge him in his own stocking on the left still wrapping on the right. This is the second time we have healed the left leg but he did not put a stocking on last time. Hopefully this will maintain the edema from chronic venous disease with secondary lymphedema 10/6; he comes in today having a stocking on the left leg. They had  trouble getting it on there is a lot of increase in swelling 2 small open areas one anteriorly and one on the medial calf. They report a lot of difficulty getting the stocking on. Paradoxically the area on the right that we have been wrapping posteriorly is closed 10/13; he comes in today with wounds bilaterally including superficial areas on the left medial and left lateral calf. As well as the right posterior has reopened in the Achilles area superiorly. He still does not have his juxta lite stockings although truthfully we would not of been able to use them today anyway. Apparently have been ordered and paid for from [employer] although they have not been delivered 10/20; his area on the right is just the boat closed on the right posterior. Still has the area on the left lateral and a very tiny area on the left medial. He has his bilateral juxta lites although he is not ready for them this week. He tolerated the increase to 4 layer compression last week quite well 10/27; the area on the right posterior calf is once again closed. He has a superficial area on the left lateral calf that is still open. He has been using Hydrofera Blue and bilateral 4-layer compression.  He can change to his own juxta lite stocking on the right and we are instructing him today 11/3; the area on the right posterior calf reopened according to the patient and his wife after they took off the stocking when they got home last week. Apparently scabbed over there is now a fairly substantial wound which looks pretty much the same. Our intake nurse noted that they were using the juxta lite stockings appropriately. I was really hoping I might be able to close him out today. He has 1 very tiny remaining area on the left lateral lower leg. 11/10; right posterior calf wound measures smaller but is still open. We have been using Hydrofera Blue. On the left he has a small oval-shaped wound and he seems to have had another wound distally that  is open and likely a blister. We are using Hydrofera Blue under compression 11/17; right posterior calf wound continues to get better. We have been using Hydrofera Blue. On the left lateral one of the wounds has closed still a small open area. We have been using Hydrofera Blue on this as well. Both areas have been under 4-layer compression Arrives in clinic today with some swelling in the dorsal foot on the right some erythema of his forefoot and toes. Initially when I looked at this I almost thought this was a sunburn distal to a wrap injury. 12/1; right posterior calf wound debrided with a curette. We have been using Hydrofera Blue on the left anterior lateral he has an area across the mid tibia. Finally a small area on the left lateral lower calf. Finally he continues to have de-epithelialized areas on the dorsal aspect of his toes. Initially thought this might be a burn injury when I saw him 2 weeks ago. I now wonder about tinea. I have also reviewed his arterial studies which were really quite good in July/20 with normal TBI's and ABIs but monophasic waveforms 12/8; comes in today with worsening problems especially on the left leg where he now has a cluster of wounds in the left anterior mid tibia. Very poor edema control. I reduced him to 3 layer from 4 layer compression last week because of some concern about blood flow to his toes however he does not have good edema control on the left leg. Right leg edema control looks satisfactory. On the left he has a cluster of wounds anteriorly, small area on the left medial fifth met head and then the collection of areas on his toes which appear better On the right he has the original area on the right posterior calf, a new area right medially. His formal arterial studies from mid July are noted below. He was evaluated by Dr.Berry ABI Findings: +---------+------------------+-----+----------+--------+ Right Rt Pressure (mmHg)IndexWaveform Comment   +---------+------------------+-----+----------+--------+ Brachial 176     +---------+------------------+-----+----------+--------+ ATA 176 0.99 monophasic  +---------+------------------+-----+----------+--------+ PTA 183 1.03 monophasic  +---------+------------------+-----+----------+--------+ PERO 172 0.97 monophasic  +---------+------------------+-----+----------+--------+ Great T oe153 0.86    +---------+------------------+-----+----------+--------+ +---------+------------------+-----+-----------+-------+ Left Lt Pressure (mmHg)IndexWaveform Comment +---------+------------------+-----+-----------+-------+ Brachial 178     +---------+------------------+-----+-----------+-------+ ATA 162 0.91 multiphasic  +---------+------------------+-----+-----------+-------+ PTA 188 1.06 multiphasic  +---------+------------------+-----+-----------+-------+ PERO 158 0.89 monophasic   +---------+------------------+-----+-----------+-------+ Great T oe150 0.84    +---------+------------------+-----+-----------+-------+ +-------+-----------+-----------+------------+------------+ ABI/TBIT oday's ABIT oday's TBIPrevious ABIPrevious TBI +-------+-----------+-----------+------------+------------+ Right 1.03 0.86 1.11   +-------+-----------+-----------+------------+------------+ Left 1.06 0.84 1.27   +-------+-----------+-----------+------------+------------+ Tibial waveforms somewhat difficult to record due to irregular heartbeat. Bilateral ABIs appear essentially unchanged compared to prior study on 06/21/15. Summary: Right: Resting right ankle-brachial index  is within normal range. No evidence of significant right lower extremity arterial disease. The right toe-brachial index is normal. Although ankle brachial indices are within normal limits (0.95-1.29), arterial Doppler waveforms at the ankle suggest some  component of arterial occlusive disease. Left: Resting left ankle-brachial index is within normal range. No evidence of significant left lower extremity arterial disease. The left toe-brachial index is normal. Although ankle brachial indices are within normal limits (0.95-1.29), arterial Doppler waveforms at the ankle suggest some component of arterial occlusive disease. 12/15; the patient's area on the left mid tibia looks better. Right posterior calf also better. He has the area on the left foot as well. All of his toes look better I think this was tinea. We are using Hydrofera Blue everywhere else The patient was in urgent care yesterday with wheezing and shortness of breath. He got azithromycin and prednisone. He feels better. His lungs are currently clear to auscultation. He was not tested for Covid 19 12/29; the patient arrives in clinic today with quite a bit change. 2 weeks ago he only had areas on the left mid tibia right posterior calf with tinea pedis resolving between his toes. He arrives in clinic today with several areas on the dorsal toes on the right, dorsal left second toe. He has skin breakdown in the left medial calf probably from excess edema. Small area proximally in the medial calf. He has weeping edema fluid coming out of the skin excoriations on the left medial calf. He tells me that he is having a cardiac catheterization next week. I had a quick look at [facility] health link. He was found to have an ejection fraction of 25% during the work-up for persistent atrial fibrillation. He saw his cardiology office yesterday seen by the nurse practitioner. She increased his carvedilol. He has not been on diuretics for apparently several months and indeed in the nurse practitioner Dietrich Pates notes she had knowledge of this. 10/04/2019 on evaluation today patient presents as a walk-in visit concerning the fact that he did not have an appointment here for our clinic at this point.  He actually had a cardiac catheterization earlier today and then came from there to here in order to be evaluated. With that being said unfortunately he is having significant cellulitis of his left lower extremity upon evaluation today this appears to have deteriorated even since last week's evaluation with Dr. Dellia Nims. The right lower extremity is actually doing okay I really see no evidence of deterioration at this point at those locations. In fact the right leg seems in general be doing quite well. Nonetheless I am concerned about infection and cellulitis of the left lower extremity and again considering his weakened heart I do not want him to develop into sepsis at all. He is also having some trouble breathing today and I understand according to nursing staff this is always the case to some degree. With that being said the patient unfortunately seems to be in my opinion a little bit worse even his wife feels like that may be the case today. Unsure exactly what is leading to this. Cardiac catheterization I did review the report which showed an ejection fraction of 25% he also had an LAD blockage of around 25% based on what I saw. With that being said there was no significant blockages that required stenting at this point he does have weakened cardiac muscles compared to normal. 10/05/2019; patient was seen yesterday in clinic. He was sent to the ER because of  cellulitis of the left leg possibility. In the ER he was given 1 dose of IV Levaquin and discharged on Keflex. He came in the clinic initially for a nurse visit to rewrap his left leg. We did not look at the right leg today that is an Haematologist. The patient also had a cardiac cath. According to him there were no blockages but a very low ejection fraction. 1/12; back for an early follow-up. The condition of the left lower leg is a lot better although there are multiple open areas. All of them with not a particularly viable surface. On the right  posterior calf he has a single wound with a clean surface. He has a wound with exposed bone on the PIP of the left second toe dorsally. He has wounds on the dorsal right first second and third toes. His arterial studies were normal. 1/19; the patient has 3 open wounds on the left leg anteriorly in the mid tibia, distally and medially just above the ankle and posteriorly. On the right he has a small area dime sized on the right posterior calf. His edema control is a lot better. He still has wounds on the right second and left second toes. The left second toe has exposed bone. X-ray I did last week did not show evidence of osteomyelitis in the left foot. 1/26; patient with a multiplicity of wounds and problems. On the left he has a circular area on the left anterior mid tibia Left just above the medical ankle -left 2nd toe pip -right 2nd toe right posterior calf 2/2; patient with a multiplicity of wounds and problems. He has severe chronic venous insufficiency and the wounds on his leg are all on the left left anterior left medial at the medial malleolus and left posterior. We have been using Iodoflex to these areas to help with ongoing debridement. He also has largely traumatic wounds on his toes this includes the left second with exposed bone. Bone culture I did last week showed Staph aureus which is methicillin sensitive. I discussed with him today the idea of an amputation of this toe because it is literally nonfunctional however he wants to try antibiotics. Antibiotic choice is complicated by the fact that he is on Coumadin. He also has wounds over the dorsal part of the right first which is close to closed. Right second toe has exposed bone and the right third toe at the base of the right third toe is just about closed as well. We have been using silver alginate 2/9; patient with severe chronic venous insufficiency. He has large wounds on the left anterior mid tibia and on the left medial just  above the ankle. I am concerned today about the depth of the area on the left mid tibia may be exposing into the muscle. He has small areas on the left posterior calf and on the right posterior calf although these do not look as threatening. He also has traumatic wounds on his toes. The right first and third are closed however the bilateral second toes have exposed bone. The area on the left is open into the distal interphalangeal joint. In my opinion these toes are nonsalvageable and will need amputation 2/16; patient with severe chronic venous insufficiency. He has wounds on the left anterior mid tibia left medial lower leg just above the ankle. He has small areas on the left posterior calf and a more prominent area on the right posterior calf. All of these are related to poorly  controlled venous hypertension. He also had traumatic wounds on the PIPs of both second toes. Unfortunately both of these have exposed bone and in the case of the left this I think goes right into the joint itself. I do not think either 1 of these is salvageable. I have reviewed his arterial evaluation that was done in July last year. He also saw Dr. [name] in June. He felt these were venous stasis. Previously had segmental arterial pressures performed in 11/03/2014 which were normal. He was sent for for a follow-up arterial evaluation on July 1. On the right his ABIs were 1.03 with a TBI of 0.86 although his waveforms were monophasic on the left he had multiphasic waveforms at the ATA PTA but monophasic at the peroneal nevertheless his ABI was normal at 1.06 TBI also normal at 0.84. I think he has enough blood flow to heal an amputation which I think he needs of the left second toe and probably the right second toe as well 2/23; the patient is going for his bilateral second toe amputation by Dr. Amalia Hailey on Friday. In the meantime is 3 areas on the left leg and the small area on the right posterior look better. We have been using  silver collagen. 3/2; the patient's toe amputations were canceled because of cardiac concerns. Apparently this surgery will need to be done in the hospital although they do not have an appointment. In the meantime he is continues to have 3 areas on the left leg one anteriorly and 2 smaller areas posteriorly on the left as well as the small posterior area on the right. We have been using silver collagen with compression 3/9; still no word on the second toe amputations bilaterally. 3 wounds on the left leg all look better and the area on the right posterior we have been using silver collagen I change this to [facility] 3/16; amputations next Monday both second toes he is apparently going to be hospitalized. He has on his left leg including left anterior and left lateral look better very superficial also the right posterior. 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 He continues to have left leg wounds anteriorly x2 posteriorly x1 at the left lower leg just above the ankle anteriorly x1 and posteriorly on the right x1 3/30; his anterior left leg wounds are considerably smaller. In fact the only wound that is not smaller is on the right. We have been using Hydrofera Blue under Kerlix Cobax 4/6; his anterior left leg wounds continue to contract in fact the area distally and medially has closed over. He still has the area anteriorly on the left. Both posterior calf wounds on the left and right are much smaller. We saw his surgical wound at the left second toe amputation site that still sutured it looks healthy, this is being followed by his podiatrist. We went over what compression he has he has bilateral juxta lites I am hopeful the issue here was that we just did not have them tight enough because he broke down very quickly the last time we healed him out. 4/13; the patient has 3 remaining wounds  one small area on the left anterior lower leg one on the left posterior and one on the right anterior. We have been using Hydrofera Blue under compression compression all of these are contracting. We did discuss life in the eventuality he will  totally closed which seems likely in the next week or 2. He has bilateral juxta lite stockings. These will need to be set at 30-40 compression. Since we last closed him over for his bilateral lower extremity wounds he is also been treated for heart failure which may help. He undoubtedly has severe chronic venous hypertension however as well 4/19; the area on the left anterior tibial area is closed. The area on the right posterior calf is just above closed. The area on the left posterior calf has some depth. 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 Everything looks quite good here. He does not need a specific dressing 4/27; the right posterior leg wound is closed there is nothing open on the right. Still has a small punched out area on the left posterior calf. He will be transitioned into the juxta lite stocking on the right today. Still wrapping the left leg 5/4; right leg is still closed although we did not take the juxta lite stocking off. Will do this next week. Small punched out area on the left posterior calf is still not healed we have been using silver collagen under compression Electronic Signature(s) Signed: 01/30/2020 5:25:34 PM By: [name] MD Entered By: [name] on 01/30/2020 08:49:59 -------------------------------------------------------------------------------- Physical Exam Details Patient Name: Date of Service: Daniel Phenix D. 01/30/2020 8:00 A M Medical Record Number: [id number] Patient Account Number: [id number] Date of Birth/Sex: Treating RN: [birth year] (78  y.o. [name]) [name] Primary Care Provider: Leanord Asal, SA RA H Other Clinician: Referring Provider: Treating Provider/Extender: Candie Mile, SA RA H Weeks in Treatment: 48 Constitutional Sitting or standing Blood Pressure is within target range for patient.. Pulse regular and within target range for patient.Marland Kitchen Respirations regular, non-labored and within target range.. Temperature is normal and within the target range for the patient.Marland Kitchen Appears in no distress. Notes Wound exam; the only remaining wound is on the left posterior calf. Really no change from last week however. A lot of debris on the wound surface. Washed off with wound care cleanser and gauze and the remaining surface debris including skin and slough removed with a #3 curette. We have good edema control. Peripheral pulses are palpable Electronic Signature(s) Signed: 01/30/2020 5:25:34 PM By: [name] MD Entered By: [name] on 01/30/2020 08:52:02 -------------------------------------------------------------------------------- Physician Orders Details Patient Name: Date of Service: Daniel Phenix D. 01/30/2020 8:00 A M Medical Record Number: [id number] Patient Account Number: [id number] Date of Birth/Sex: Treating RN: Aug 19, 1941 (78 y.o. [name]) [name] Primary Care Provider: Leanord Asal, SA RA H Other Clinician: Referring Provider: Treating Provider/Extender: Candie Mile, SA RA H Weeks in Treatment: 79 Verbal / Phone Orders: No Diagnosis Coding ICD-10 Coding Code Description L97.211 Non-pressure chronic ulcer of right calf limited to breakdown of skin L97.221 Non-pressure chronic ulcer of left calf limited to breakdown of skin I87.333 Chronic venous hypertension (idiopathic) with ulcer and inflammation of bilateral lower extremity E11.51 Type 2 diabetes mellitus with diabetic peripheral angiopathy without gangrene E11.42 Type 2 diabetes mellitus with diabetic polyneuropathy L97.524 Non-pressure  chronic ulcer of other part of left foot with necrosis of bone L97.511 Non-pressure chronic ulcer of other part of right foot limited to breakdown of skin A07.62 Chronic systolic (congestive) heart failure U63.335 Other chronic osteomyelitis, left ankle and foot Follow-up Appointments ppointment in  1 week. - Tuesday Return A Dressing Change Frequency Wound #25 Left,Distal,Posterior Lower Leg Do not change entire dressing for one week. Skin Barriers/Peri-Wound Care TCA Cream or Ointment Wound Cleansing May shower with protection. Primary Wound Dressing Wound #25 Left,Distal,Posterior Lower Leg Hydrofera Blue Secondary Dressing Dry Gauze - secure toes with tape Edema Control Wound #25 Left,Distal,Posterior Lower Leg Kerlix and Coban - Left Lower Extremity Support Garment 20-30 mm/Hg pressure to: - right lower leg, apply lotion daily with special attention to ankle area Off-Loading Open toe surgical shoe to: - left and right foot Electronic Signature(s) Signed: 01/30/2020 5:25:34 PM By: [name] MD Signed: 01/31/2020 4:37:47 PM By: [name] RN Entered By: [name] on 01/30/2020 08:35:09 -------------------------------------------------------------------------------- Problem List Details Patient Name: Date of Service: Daniel Phenix D. 01/30/2020 8:00 A M Medical Record Number: [id number] Patient Account Number: [id number] Date of Birth/Sex: Treating RN: December 19, 1940 (78 y.o. [name]) [name] Primary Care Provider: Leanord Asal, SA RA H Other Clinician: Referring Provider: Treating Provider/Extender: Candie Mile, SA RA H Weeks in Treatment: 48 Active Problems ICD-10 Encounter Code Description Active Date MDM Diagnosis L97.221 Non-pressure chronic ulcer of left calf limited to breakdown of skin 02/28/2019 No Yes I87.333 Chronic venous hypertension (idiopathic) with ulcer and inflammation of 02/28/2019 No Yes bilateral lower extremity E11.51 Type 2 diabetes mellitus  with diabetic peripheral angiopathy without gangrene 02/28/2019 No Yes E11.42 Type 2 diabetes mellitus with diabetic polyneuropathy 02/28/2019 No Yes Inactive Problems ICD-10 Code Description Active Date Inactive Date B35.3 Tinea pedis 09/05/2019 09/05/2019 L97.521 Non-pressure chronic ulcer of other part of left foot limited to breakdown of skin 09/26/2019 09/26/2019 L97.211 Non-pressure chronic ulcer of right calf limited to breakdown of skin 02/28/2019 02/28/2019 W09.811 Other chronic osteomyelitis, left ankle and foot 10/31/2019 10/31/2019 B14.78 Chronic systolic (congestive) heart failure 10/05/2019 10/05/2019 L97.524 Non-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 01/30/2020 8:00 A M Medical Record Number: [id number] Patient Account Number: [id number] Date of Birth/Sex: Treating RN: August 31, 1941 (78 y.o. [name]) [name] Primary Care Provider: Leanord Asal, SA RA H Other Clinician: Referring Provider: Treating Provider/Extender: Candie Mile, SA RA H Weeks in Treatment: 48 Subjective History of Present Illness (HPI) The following HPI elements were documented for the patient's wound: Location: Patient presents with a wound to left lower leg. Quality: Patient reports No Pain. Duration: 2 months no cig or alcohol. spontaneous appearance in area of stasis dermamtitis. [name] on metformin only. chronic afib on Coumadin. diabetes and coag studies not good. hba1c 7.5. ivr 4.5. no pain or sxs of systemic disease. hx chf. no intermittent  claudication 02/28/2019 Readmission This is a now a 79 year old man who was previously cared for in 2016 by Dr. Lindon Gross for wounds on his lower extremities. At that point he had venous reflux studies although I cannot seem to open these in [place] [facility]. He had arterial studies showing an ABI of 1.11 on the right and 1.27 on the left his waveforms were triphasic bilaterally. He was discharged in stockings although I do not believe he is wearing these in some time. He tells me that about a month ago he noted openings of  a large wound on the posterior right calf and 2 smaller areas on the left lateral calf and a small area more recently on the left posterior calf. He has been dressing these with peroxide and triple antibiotic ointment. He is not wearing compression. Past medical history; type 2 diabetes with peripheral neuropathy, chronic venous insufficiency, hypertension, cardiomyopathy, chronic atrial fibrillation on Coumadin, prostate cancer, hyperlipidemia, gout, ABI in our clinic was 1.34 on the left and not obtainable on the right 6/9; this is a patient who has chronic venous insufficiency. He has a fairly substantial area on the right posterior calf, left lateral calf and a small area on the left posterior calf. On arrival last week he had very palpable popliteal and femoral pulses but nothing in his bilateral feet. Unfortunately we cannot get arterial studies until July 1 at Dr. Kennon Holter office. They live in [place]. We use silver alginate under Kerlix Coban 6/16; patient with chronic venous insufficiency with wounds on his bilateral lower extremities. When he came into our clinic he was discovered to have a complete absence of peripheral pedal pulses at either the dorsalis pedis or posterior tibial. He does have easily palpable femoral and popliteal pulses. He sees Dr. [name] tomorrow for noninvasive arterial tests. He may also require venous reflux evaluation although I do not view this as  an urgent thing. We have been using silver alginate. His wound surfaces of cleaned up quite nicely 6/23; patient with chronic venous insufficiency with wounds on his bilateral lower extremities. His wounds all are somewhat better looking. He did go to Dr. Kennon Holter office but somehow ended up on the doctors schedule rather than being scheduled for noninvasive tests therefore his noninvasive tests are scheduled for July 1. We agree that he has venous insufficiency ulcers but I cannot feel any pulses in his lower extremities dictating the need for test. We are only using Kerlix and light Coban unfortunately this appears to be holding the edema 6/30; has his arterial studies tomorrow. We have been using Kerlix and light Coban will go to a more aggressive compression if the arterial studies will allow. We all agreed these are venous wounds however I cannot feel pulses at either the dorsalis pedis or posterior tibial bilaterally. His wounds generally look some better including left lateral and right posterior. 7/7-Patient returns at 1 week in Kerlix/Coban to both legs, with improvement, in the left lateral and right posterior lower leg wounds, ABI's are normal in both legs per vascular studies, TBI is also normal on both sides, we are using hydrofera blue to the wounds 7/14; patient's arterial studies from 2 weeks ago showed an ABI on the right at 1.03 with a TBI of 0.86. On the left the ABI was 1.06 with a TBI of 0.84. Notable for the fact that his arterial waveforms were monophasic in all of the lower extremity arteries suggesting some degree of arterial occlusive disease but in general this was felt to be fairly adequate for healing. His compression was increased from 2-3 layer which is appropriate. Dressing was changed to [facility] 7/21; patient's wounds are measuring smaller. The more substantial one on the right posterior calf, second 1 on the left lateral calf. Using [facility] on both wound  areas 7/28; patient continues to make nice improvements. The area on the right posterior calf is smaller. Area on the left lateral calf also is smaller. We have been using Hydrofera Blue under compression. The patient will need compression stockings and we have measured  him for these in the eventuality that these heal which really should not be too long from now 8/4-Patient continues to make improvement, the right posterior calf area smaller with rim of keratotic skin on one side, the left wound is definitely smaller and improving. 8/11-Returns at 1 week, after being in 3 layer compression on both legs, both wounds appear to be improving, making good progress, patient is happy, pain is also less especially in the right leg wound 8/18; the area on the left anterior lower leg is healed. On the right posterior leg the wound remains although the dimensions are a lot better. 8/25; he arrives in clinic today with a large body of open wound on the left lateral calf. All of the 3 wounds in this area are in close juxtaposition to each other. The story is that we discharged him last week with no a wrap on the left leg. They went to [facility] could not get in as they are only excepting phone orders or online orders for stockings hence they did not put any stocking on the left leg all week. They have something at home but the patient with that was either incapable or just did not put them on. Apparently these opened 1 morning after getting out of bed. The area on the right has no real change 9/1; patient has bilateral lower extremity wounds in the setting of severe chronic venous insufficiency and secondary lymphedema. He arrived last week with new areas on the left lateral lower leg after we did not wrap him and he did not use his stockings. Nevertheless the areas on the left look better today under compression. Posterior right calf does not really changed. We are using Hydrofera Blue on both areas under  compression 9/15; bilateral lower extremity wounds in the setting of severe chronic venous insufficiency and secondary lymphedema. He has 20 to 30 mmHg below-knee compression stockings under the eventuality that these close over. We did get the left leg to close but he did not transition to a stocking and this reopened. There are 2 open areas on the left posterior lateral calf and one on the right. Both of these look satisfactory. Using [facility] 9/22; bilateral lower extremity wounds in the setting of chronic venous insufficiency. 2 superficial areas on the left lateral calf. One on the right just above the Achilles area. We have good edema control we have been using Hydrofera Blue 9/29; the areas on the left lateral calf are healed. On the right just above the Achilles and tendon area things look a lot better small wounds one scabbed area. We have been using Hydrofera Blue. We can discharge him in his own stocking on the left still wrapping on the right. This is the second time we have healed the left leg but he did not put a stocking on last time. Hopefully this will maintain the edema from chronic venous disease with secondary lymphedema 10/6; he comes in today having a stocking on the left leg. They had trouble getting it on there is a lot of increase in swelling 2 small open areas one anteriorly and one on the medial calf. They report a lot of difficulty getting the stocking on. ooParadoxically the area on the right that we have been wrapping posteriorly is closed 10/13; he comes in today with wounds bilaterally including superficial areas on the left medial and left lateral calf. As well as the right posterior has reopened in the Achilles area superiorly. He still does not  have his juxta lite stockings although truthfully we would not of been able to use them today anyway. Apparently have been ordered and paid for from [employer] although they have not been delivered 10/20; his area on the  right is just the boat closed on the right posterior. Still has the area on the left lateral and a very tiny area on the left medial. He has his bilateral juxta lites although he is not ready for them this week. He tolerated the increase to 4 layer compression last week quite well 10/27; the area on the right posterior calf is once again closed. He has a superficial area on the left lateral calf that is still open. He has been using Hydrofera Blue and bilateral 4-layer compression. He can change to his own juxta lite stocking on the right and we are instructing him today 11/3; the area on the right posterior calf reopened according to the patient and his wife after they took off the stocking when they got home last week. Apparently scabbed over there is now a fairly substantial wound which looks pretty much the same. Our intake nurse noted that they were using the juxta lite stockings appropriately. I was really hoping I might be able to close him out today. He has 1 very tiny remaining area on the left lateral lower leg. 11/10; right posterior calf wound measures smaller but is still open. We have been using Hydrofera Blue. On the left he has a small oval-shaped wound and he seems to have had another wound distally that is open and likely a blister. We are using Hydrofera Blue under compression 11/17; right posterior calf wound continues to get better. We have been using Hydrofera Blue. On the left lateral one of the wounds has closed still a small open area. We have been using Hydrofera Blue on this as well. Both areas have been under 4-layer compression Arrives in clinic today with some swelling in the dorsal foot on the right some erythema of his forefoot and toes. Initially when I looked at this I almost thought this was a sunburn distal to a wrap injury. 12/1; right posterior calf wound debrided with a curette. We have been using Hydrofera Blue on the left anterior lateral he has an area across  the mid tibia. Finally a small area on the left lateral lower calf. Finally he continues to have de-epithelialized areas on the dorsal aspect of his toes. Initially thought this might be a burn injury when I saw him 2 weeks ago. I now wonder about tinea. I have also reviewed his arterial studies which were really quite good in July/20 with normal TBI's and ABIs but monophasic waveforms 12/8; comes in today with worsening problems especially on the left leg where he now has a cluster of wounds in the left anterior mid tibia. Very poor edema control. I reduced him to 3 layer from 4 layer compression last week because of some concern about blood flow to his toes however he does not have good edema control on the left leg. Right leg edema control looks satisfactory. ooOn the left he has a cluster of wounds anteriorly, small area on the left medial fifth met head and then the collection of areas on his toes which appear better ooOn the right he has the original area on the right posterior calf, a new area right medially. His formal arterial studies from mid July are noted below. He was evaluated by Dr.Berry ABI Findings: +---------+------------------+-----+----------+--------+ Right Rt Pressure (  mmHg)IndexWaveform Comment  +---------+------------------+-----+----------+--------+ Brachial 176     +---------+------------------+-----+----------+--------+ ATA 176 0.99 monophasic  +---------+------------------+-----+----------+--------+ PTA 183 1.03 monophasic  +---------+------------------+-----+----------+--------+ PERO 172 0.97 monophasic  +---------+------------------+-----+----------+--------+ Great T oe153 0.86    +---------+------------------+-----+----------+--------+ +---------+------------------+-----+-----------+-------+ Left Lt Pressure (mmHg)IndexWaveform Comment +---------+------------------+-----+-----------+-------+ Brachial 178      +---------+------------------+-----+-----------+-------+ ATA 162 0.91 multiphasic  +---------+------------------+-----+-----------+-------+ PTA 188 1.06 multiphasic  +---------+------------------+-----+-----------+-------+ PERO 158 0.89 monophasic   +---------+------------------+-----+-----------+-------+ Great T oe150 0.84    +---------+------------------+-----+-----------+-------+ +-------+-----------+-----------+------------+------------+ ABI/TBIT oday's ABIT oday's TBIPrevious ABIPrevious TBI +-------+-----------+-----------+------------+------------+ Right 1.03 0.86 1.11   +-------+-----------+-----------+------------+------------+ Left 1.06 0.84 1.27   +-------+-----------+-----------+------------+------------+ Tibial waveforms somewhat difficult to record due to irregular heartbeat. Bilateral ABIs appear essentially unchanged compared to prior study on 06/21/15. Summary: Right: Resting right ankle-brachial index is within normal range. No evidence of significant right lower extremity arterial disease. The right toe-brachial index is normal. Although ankle brachial indices are within normal limits (0.95-1.29), arterial Doppler waveforms at the ankle suggest some component of arterial occlusive disease. Left: Resting left ankle-brachial index is within normal range. No evidence of significant left lower extremity arterial disease. The left toe-brachial index is normal. Although ankle brachial indices are within normal limits (0.95-1.29), arterial Doppler waveforms at the ankle suggest some component of arterial occlusive disease. 12/15; the patient's area on the left mid tibia looks better. Right posterior calf also better. He has the area on the left foot as well. All of his toes look better I think this was tinea. We are using Hydrofera Blue everywhere else The patient was in urgent care yesterday with wheezing and shortness of breath.  He got azithromycin and prednisone. He feels better. His lungs are currently clear to auscultation. He was not tested for Covid 19 12/29; the patient arrives in clinic today with quite a bit change. 2 weeks ago he only had areas on the left mid tibia right posterior calf with tinea pedis resolving between his toes. He arrives in clinic today with several areas on the dorsal toes on the right, dorsal left second toe. He has skin breakdown in the left medial calf probably from excess edema. Small area proximally in the medial calf. He has weeping edema fluid coming out of the skin excoriations on the left medial calf. He tells me that he is having a cardiac catheterization next week. I had a quick look at [facility] link. He was found to have an ejection fraction of 25% during the work-up for persistent atrial fibrillation. He saw his cardiology office yesterday seen by the nurse practitioner. She increased his carvedilol. He has not been on diuretics for apparently several months and indeed in the nurse practitioner Dietrich Pates notes she had knowledge of this. 10/04/2019 on evaluation today patient presents as a walk-in visit concerning the fact that he did not have an appointment here for our clinic at this point. He actually had a cardiac catheterization earlier today and then came from there to here in order to be evaluated. With that being said unfortunately he is having significant cellulitis of his left lower extremity upon evaluation today this appears to have deteriorated even since last week's evaluation with Dr. Dellia Nims. The right lower extremity is actually doing okay I really see no evidence of deterioration at this point at those locations. In fact the right leg seems in general be doing quite well. Nonetheless I am concerned about infection and cellulitis of the left lower extremity and again considering his weakened heart I do not want him to develop into  sepsis at all. He is also having  some trouble breathing today and I understand according to nursing staff this is always the case to some degree. With that being said the patient unfortunately seems to be in my opinion a little bit worse even his wife feels like that may be the case today. Unsure exactly what is leading to this. Cardiac catheterization I did review the report which showed an ejection fraction of 25% he also had an LAD blockage of around 25% based on what I saw. With that being said there was no significant blockages that required stenting at this point he does have weakened cardiac muscles compared to normal. 10/05/2019; patient was seen yesterday in clinic. He was sent to the ER because of cellulitis of the left leg possibility. In the ER he was given 1 dose of IV Levaquin and discharged on Keflex. He came in the clinic initially for a nurse visit to rewrap his left leg. We did not look at the right leg today that is an Haematologist. The patient also had a cardiac cath. According to him there were no blockages but a very low ejection fraction. 1/12; back for an early follow-up. The condition of the left lower leg is a lot better although there are multiple open areas. All of them with not a particularly viable surface. On the right posterior calf he has a single wound with a clean surface. He has a wound with exposed bone on the PIP of the left second toe dorsally. He has wounds on the dorsal right first second and third toes. His arterial studies were normal. 1/19; the patient has 3 open wounds on the left leg anteriorly in the mid tibia, distally and medially just above the ankle and posteriorly. On the right he has a small area dime sized on the right posterior calf. His edema control is a lot better. He still has wounds on the right second and left second toes. The left second toe has exposed bone. X-ray I did last week did not show evidence of osteomyelitis in the left foot. 1/26; patient with a multiplicity of  wounds and problems. ooOn the left he has a circular area on the left anterior mid tibia ooLeft just above the medical ankle -left 2nd toe pip -right 2nd toe right posterior calf 2/2; patient with a multiplicity of wounds and problems. He has severe chronic venous insufficiency and the wounds on his leg are all on the left left anterior left medial at the medial malleolus and left posterior. We have been using Iodoflex to these areas to help with ongoing debridement. He also has largely traumatic wounds on his toes this includes the left second with exposed bone. Bone culture I did last week showed Staph aureus which is methicillin sensitive. I discussed with him today the idea of an amputation of this toe because it is literally nonfunctional however he wants to try antibiotics. Antibiotic choice is complicated by the fact that he is on Coumadin. He also has wounds over the dorsal part of the right first which is close to closed. Right second toe has exposed bone and the right third toe at the base of the right third toe is just about closed as well. We have been using silver alginate 2/9; patient with severe chronic venous insufficiency. He has large wounds on the left anterior mid tibia and on the left medial just above the ankle. I am concerned today about the depth of the area on  the left mid tibia may be exposing into the muscle. He has small areas on the left posterior calf and on the right posterior calf although these do not look as threatening. He also has traumatic wounds on his toes. The right first and third are closed however the bilateral second toes have exposed bone. The area on the left is open into the distal interphalangeal joint. In my opinion these toes are nonsalvageable and will need amputation 2/16; patient with severe chronic venous insufficiency. He has wounds on the left anterior mid tibia left medial lower leg just above the ankle. He has small areas on the left  posterior calf and a more prominent area on the right posterior calf. All of these are related to poorly controlled venous hypertension. He also had traumatic wounds on the PIPs of both second toes. Unfortunately both of these have exposed bone and in the case of the left this I think goes right into the joint itself. I do not think either 1 of these is salvageable. I have reviewed his arterial evaluation that was done in July last year. He also saw Dr. [name] in June. He felt these were venous stasis. Previously had segmental arterial pressures performed in 11/03/2014 which were normal. He was sent for for a follow-up arterial evaluation on July 1. On the right his ABIs were 1.03 with a TBI of 0.86 although his waveforms were monophasic on the left he had multiphasic waveforms at the ATA PTA but monophasic at the peroneal nevertheless his ABI was normal at 1.06 TBI also normal at 0.84. I think he has enough blood flow to heal an amputation which I think he needs of the left second toe and probably the right second toe as well 2/23; the patient is going for his bilateral second toe amputation by Dr. Amalia Hailey on Friday. In the meantime is 3 areas on the left leg and the small area on the right posterior look better. We have been using silver collagen. 3/2; the patient's toe amputations were canceled because of cardiac concerns. Apparently this surgery will need to be done in the hospital although they do not have an appointment. In the meantime he is continues to have 3 areas on the left leg one anteriorly and 2 smaller areas posteriorly on the left as well as the small posterior area on the right. We have been using silver collagen with compression 3/9; still no word on the second toe amputations bilaterally. 3 wounds on the left leg all look better and the area on the right posterior we have been using silver collagen I change this to [facility] 3/16; amputations next Monday both second toes he is  apparently going to be hospitalized. He has on his left leg including left anterior and left lateral look better very superficial also the right posterior. 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 He continues to have left leg wounds anteriorly x2 posteriorly x1 at the left lower leg just above the ankle anteriorly x1 and posteriorly on the right x1 3/30; his anterior left leg wounds are considerably smaller. In fact the only wound that is not smaller is on the right. We have been using Hydrofera Blue under Kerlix Cobax 4/6; his anterior left leg wounds continue to contract in fact the area distally and medially has closed over. He still has the area anteriorly on the left.  Both posterior calf wounds on the left and right are much smaller. We saw his surgical wound at the left second toe amputation site that still sutured it looks healthy, this is being followed by his podiatrist. We went over what compression he has he has bilateral juxta lites I am hopeful the issue here was that we just did not have them tight enough because he broke down very quickly the last time we healed him out. 4/13; the patient has 3 remaining wounds one small area on the left anterior lower leg one on the left posterior and one on the right anterior. We have been using Hydrofera Blue under compression compression all of these are contracting. We did discuss life in the eventuality he will totally closed which seems likely in the next week or 2. He has bilateral juxta lite stockings. These will need to be set at 30-40 compression. Since we last closed him over for his bilateral lower extremity wounds he is also been treated for heart failure which may help. He undoubtedly has severe chronic venous hypertension however as well 4/19; the area on the left anterior tibial area is closed. The area on the right posterior calf is  just above closed. The area on the left posterior calf has some depth. 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 Everything looks quite good here. He does not need a specific dressing 4/27; the right posterior leg wound is closed there is nothing open on the right. Still has a small punched out area on the left posterior calf. He will be transitioned into the juxta lite stocking on the right today. Still wrapping the left leg 5/4; right leg is still closed although we did not take the juxta lite stocking off. Will do this next week. Small punched out area on the left posterior calf is still not healed we have been using silver collagen under compression Objective Constitutional Sitting or standing Blood Pressure is within target range for patient.. Pulse regular and within target range for patient.Marland Kitchen Respirations regular, non-labored and within target range.. Temperature is normal and within the target range for the patient.Marland Kitchen Appears in no distress. Vitals Time Taken: 7:58 AM, Height: 74 in, Source: Stated, Weight: 212 lbs, Source: Stated, BMI: 27.2, Temperature: 97.8 F, Pulse: 68 bpm, Respiratory Rate: 18 breaths/min, Blood Pressure: 114/47 mmHg. General Notes: Wound exam; the only remaining wound is on the left posterior calf. Really no change from last week however. A lot of debris on the wound surface. Washed off with wound care cleanser and gauze and the remaining surface debris including skin and slough removed with a #3 curette. We have good edema control. Peripheral pulses are palpable Integumentary (Hair, Skin) Wound #25 status is Open. Original cause of wound was Gradually Appeared. The wound is located on the Left,Distal,Posterior Lower Leg. The wound measures 1cm length x 0.5cm width x 0.2cm depth; 0.393cm^2 area and  0.079cm^3 volume. There is Fat Layer (Subcutaneous Tissue) Exposed exposed. There is no tunneling or undermining noted. There is a small amount of serosanguineous drainage noted. The wound margin is flat and intact. There is medium (34-66%) red granulation within the wound bed. There is a medium (34-66%) amount of necrotic tissue within the wound bed including Adherent Slough. Assessment Active Problems ICD-10 Non-pressure chronic ulcer of left calf limited to breakdown of  skin Chronic venous hypertension (idiopathic) with ulcer and inflammation of bilateral lower extremity Type 2 diabetes mellitus with diabetic peripheral angiopathy without gangrene Type 2 diabetes mellitus with diabetic polyneuropathy Procedures Wound #25 Pre-procedure diagnosis of Wound #25 is a Venous Leg Ulcer located on the Left,Distal,Posterior Lower Leg .Severity of Tissue Pre Debridement is: Fat layer exposed. There was a Selective/Open Wound Skin/Epidermis Debridement with a total area of 0.5 sq cm performed by Ricard Dillon., MD. With the following instrument(s): Curette to remove Viable and Non-Viable tissue/material. Material removed includes Slough, Skin: Dermis, and Skin: Epidermis after achieving pain control using Lidocaine 5% topical ointment. No specimens were taken. A time out was conducted at 08:32, prior to the start of the procedure. A Minimum amount of bleeding was controlled with Pressure. The procedure was tolerated well with a pain level of 0 throughout and a pain level of 0 following the procedure. Post Debridement Measurements: 1cm length x 0.5cm width x 0.2cm depth; 0.079cm^3 volume. Character of Wound/Ulcer Post Debridement is improved. Severity of Tissue Post Debridement is: Fat layer exposed. Post procedure Diagnosis Wound #25: Same as Pre-Procedure Plan Follow-up Appointments: Return Appointment in 1 week. - Tuesday Dressing Change Frequency: Wound #25 Left,Distal,Posterior Lower Leg: Do  not change entire dressing for one week. Skin Barriers/Peri-Wound Care: TCA Cream or Ointment Wound Cleansing: May shower with protection. Primary Wound Dressing: Wound #25 Left,Distal,Posterior Lower Leg: Hydrofera Blue Secondary Dressing: Dry Gauze - secure toes with tape Edema Control: Wound #25 Left,Distal,Posterior Lower Leg: Kerlix and Coban - Left Lower Extremity Support Garment 20-30 mm/Hg pressure to: - right lower leg, apply lotion daily with special attention to ankle area Off-Loading: Open toe surgical shoe to: - left and right foot 1. Hydrofera Blue under kerlix and Coban 2. I am hopeful to get some debridement with the [facility]. Not making any progress with the collagen. 3. We only have 2 layer compression but his edema control is good Electronic Signature(s) Signed: 01/30/2020 5:25:34 PM By: [name] MD Entered By: [name] on 01/30/2020 08:52:53 -------------------------------------------------------------------------------- SuperBill Details Patient Name: Date of Service: Daniel Phenix D. 01/30/2020 Medical Record Number: [id number] Patient Account Number: [id number] Date of Birth/Sex: Treating RN: 01-02-1941 (78 y.o. [name]) [name] Primary Care Provider: Leanord Asal, SA RA H Other Clinician: Referring Provider: Treating Provider/Extender: Candie Mile, SA RA H Weeks in Treatment: 48 Diagnosis Coding ICD-10 Codes Code Description [phone] Non-pressure chronic ulcer of left calf limited to breakdown of skin I87.333 Chronic venous hypertension (idiopathic) with ulcer and inflammation of bilateral lower extremity E11.51 Type 2 diabetes mellitus with diabetic peripheral angiopathy without gangrene E11.42 Type 2 diabetes mellitus with diabetic polyneuropathy Facility Procedures The patient participates with Medicare or their insurance follows the Medicare Facility Guidelines: CPT4 Code Description Modifier Quantity [id number] - DEBRIDE  WOUND 1ST 20 SQ CM OR < 1 ICD-10 Diagnosis Description L97.221 Non-pressure chronic ulcer  of left calf limited to breakdown of skin I87.333 Chronic venous hypertension (idiopathic) with ulcer and inflammation of bilateral lower extremity Physician Procedures : CPT4 Code Description Modifier [id number] - WC PHYS DEBR WO ANESTH 20 SQ CM ICD-10 Diagnosis Description L97.221 Non-pressure chronic ulcer of left calf limited to breakdown of skin I87.333 Chronic venous hypertension (idiopathic) with ulcer and  inflammation of bilateral lower extremity Quantity: 1 Electronic Signature(s) Signed: 01/30/2020 5:25:34 PM By: [name] MD Entered By: [name] on 01/30/2020 08:53:11

## 2020-01-31 NOTE — Progress Notes (Signed)
PACEN WATFORD ([id number]) , Visit Report for 01/30/2020 Arrival Information Details Patient Name: Date of Service: SHANE, BADEAUX 01/30/2020 8:00 A M Medical Record Number: [id number] Patient Account Number: [id number] Date of Birth/Sex: Treating RN: 09-24-1941 (79 y.o. [name] Primary Care Dionne Rossa: Leanord Asal, SA RA H Other Clinician: Referring Dorse Locy: Treating Cathan Gearin/Extender: Candie Mile, SA RA H Weeks in Treatment: 48 Visit Information History Since Last Visit Added or deleted any medications: No Patient Arrived: Wheel Chair Any new allergies or adverse reactions: No Arrival Time: 07:53 Had a fall or experienced change in No Accompanied By: spouse activities of daily living that may affect Transfer Assistance: None risk of falls: Patient Identification Verified: Yes Signs or symptoms of abuse/neglect since last visito No Secondary Verification Process Completed: Yes Hospitalized since last visit: No Patient Requires Transmission-Based Precautions: No Implantable device outside of the clinic excluding No Patient Has Alerts: Yes cellular tissue based products placed in the center Patient Alerts: Patient on Blood Thinner since last visit: Has Dressing in Place as Prescribed: Yes Has Compression in Place as Prescribed: Yes Pain Present Now: No Electronic Signature(s) Signed: 01/30/2020 5:30:50 PM By: Baruch Gouty RN, BSN Entered By: Baruch Gouty on 01/30/2020 07:57:34 -------------------------------------------------------------------------------- Encounter Discharge Information Details Patient Name: Date of Service: [name] 01/30/2020 8:00 A M Medical Record Number: [id number] Patient Account Number: [id number] Date of Birth/Sex: Treating RN: May 20, 1941 (79 y.o. [name] Primary Care Dov Dill: Leanord Asal, SA RA H Other Clinician: Referring Myndi Wamble: Treating Nazirah Tri/Extender: Candie Mile, SA RA H Weeks in  Treatment: 89 Encounter Discharge Information Items Post Procedure Vitals Discharge Condition: Stable Temperature (F): 97.8 Ambulatory Status: Wheelchair Pulse (bpm): 68 Discharge Destination: Home Respiratory Rate (breaths/min): 18 Transportation: Private Auto Blood Pressure (mmHg): 114/47 Accompanied By: wife Schedule Follow-up Appointment: Yes Clinical Summary of Care: Patient Declined Electronic Signature(s) Signed: 01/30/2020 5:21:30 PM By: Kela Millin Entered By: Kela Millin on 01/30/2020 08:48:57 -------------------------------------------------------------------------------- Lower Extremity Assessment Details Patient Name: Date of Service: [name] 01/30/2020 8:00 A M Medical Record Number: [id number] Patient Account Number: [id number] Date of Birth/Sex: Treating RN: 10-07-1940 (79 y.o. [name] Primary Care Yoali Conry: Leanord Asal, SA RA H Other Clinician: Referring Tameisha Covell: Treating Ousman Dise/Extender: Candie Mile, SA RA H Weeks in Treatment: 48 Edema Assessment Assessed: [Left: No] [Right: No] Edema: [Left: N] [Right: o] Calf Left: Right: Point of Measurement: 31 cm From Medial Instep 32.2 cm cm Ankle Left: Right: Point of Measurement: 11 cm From Medial Instep 19.8 cm cm Vascular Assessment Pulses: Dorsalis Pedis Palpable: [Left:No] Electronic Signature(s) Signed: 01/30/2020 5:30:50 PM By: Baruch Gouty RN, BSN Entered By: Baruch Gouty on 01/30/2020 08:04:50 -------------------------------------------------------------------------------- Multi Wound Chart Details Patient Name: Date of Service: [name] 01/30/2020 8:00 A M Medical Record Number: [id number] Patient Account Number: [id number] Date of Birth/Sex: Treating RN: October 24, 1940 (78 y.o. [name]) [name] Primary Care Cyanne Delmar: Leanord Asal, SA RA H Other Clinician: Referring Dynastee Brummell: Treating Azlyn Wingler/Extender: Candie Mile, SA RA H Weeks in Treatment:  48 Vital Signs Height(in): 74 Pulse(bpm): 77 Weight(lbs): 212 Blood Pressure(mmHg): 114/47 Body Mass Index(BMI): 27 Temperature(F): 97.8 Respiratory Rate(breaths/min): 18 Photos: [25:No Photos Left, Distal, Posterior Lower Leg] [N/A:N/A N/A] Wound Location: [25:Gradually Appeared] [N/A:N/A] Wounding Event: [25:Venous Leg Ulcer] [N/A:N/A] Primary Etiology: [25:Diabetic Wound/Ulcer of the Lower] [N/A:N/A] Secondary Etiology: [25:Extremity Cataracts, Hypertension, Peripheral] [N/A:N/A] Comorbid History: [25:Venous Disease, Type II Diabetes, Gout, Received Radiation 10/04/2019] [N/A:N/A] Date  Acquired: [25:16] [N/A:N/A] Weeks of Treatment: [25:Open] [N/A:N/A] Wound Status: [25:Yes] [N/A:N/A] Clustered Wound: [25:1] [N/A:N/A] Clustered Quantity: [25:1x0.5x0.2] [N/A:N/A] Measurements L x W x D (cm) [25:0.393] [N/A:N/A] A (cm) : rea [25:0.079] [N/A:N/A] Volume (cm) : [25:98.40%] [N/A:N/A] % Reduction in Area: [25:96.80%] [N/A:N/A] % Reduction in Volume: [25:Full Thickness Without Exposed] [N/A:N/A] Classification: [25:Support Structures Small] [N/A:N/A] Exudate A mount: [25:Serosanguineous] [N/A:N/A] Exudate Type: [25:red, brown] [N/A:N/A] Exudate Color: [25:Flat and Intact] [N/A:N/A] Wound Margin: [25:Medium (34-66%)] [N/A:N/A] Granulation A mount: [25:Red] [N/A:N/A] Granulation Quality: [25:Medium (34-66%)] [N/A:N/A] Necrotic A mount: [25:Fat Layer (Subcutaneous Tissue)] [N/A:N/A] Exposed Structures: [25:Exposed: Yes Fascia: No Tendon: No Muscle: No Joint: No Bone: No Small (1-33%)] [N/A:N/A] Epithelialization: [25:Debridement - Selective/Open Wound] [N/A:N/A] Debridement: Pre-procedure Verification/Time Out 08:32 [N/A:N/A] Taken: [25:Lidocaine 5% topical ointment] [N/A:N/A] Pain Control: [25:Slough] [N/A:N/A] Tissue Debrided: [25:Skin/Epidermis] [N/A:N/A] Level: [25:0.5] [N/A:N/A] Debridement A (sq cm): [25:rea Curette] [N/A:N/A] Instrument: [25:Minimum] [N/A:N/A] Bleeding:  [25:Pressure] [N/A:N/A] Hemostasis A chieved: [25:0] [N/A:N/A] Procedural Pain: [25:0] [N/A:N/A] Post Procedural Pain: [25:Procedure was tolerated well] [N/A:N/A] Debridement Treatment Response: [25:1x0.5x0.2] [N/A:N/A] Post Debridement Measurements L x W x D (cm) [25:0.079] [N/A:N/A] Post Debridement Volume: (cm) [25:Debridement] [N/A:N/A] Treatment Notes Wound #25 (Left, Distal, Posterior Lower Leg) 1. Cleanse With Wound Cleanser Soap and water 2. Periwound Care Moisturizing lotion TCA Cream 3. Primary Dressing Applied Hydrofera Blue 4. Secondary Dressing Dry Gauze 6. Support Layer Applied Kerlix/Coban Notes netting. Electronic Signature(s) Signed: 01/30/2020 5:25:34 PM By: [name] MD Signed: 01/31/2020 4:37:47 PM By: [name] RN Entered By: [name] on 01/30/2020 08:49:01 -------------------------------------------------------------------------------- Multi-Disciplinary Care Plan Details Patient Name: Date of Service: [name] 01/30/2020 8:00 A M Medical Record Number: [id number] Patient Account Number: [id number] Date of Birth/Sex: Treating RN: 1941/01/23 (78 y.o. [name]) [name] Primary Care Isiah Scheel: Leanord Asal, SA RA H Other Clinician: Referring Gage Weant: Treating Sharri Loya/Extender: Candie Mile, SA RA H Weeks in Treatment: 48 Active Inactive Wound/Skin Impairment Nursing Diagnoses: Knowledge deficit related to ulceration/compromised skin integrity Goals: Patient/caregiver will verbalize understanding of skin care regimen Date Initiated: 02/28/2019 Target Resolution Date: 03/01/2020 Goal Status: Active Ulcer/skin breakdown will have a volume reduction of 30% by week 4 Date Initiated: 02/28/2019 Date Inactivated: 04/04/2019 Target Resolution Date: 03/31/2019 Goal Status: Met Ulcer/skin breakdown will have a volume reduction of 50% by week 8 Date Initiated: 04/04/2019 Date Inactivated: 05/09/2019 Target Resolution Date: 05/05/2019 Goal  Status: Met Ulcer/skin breakdown will have a volume reduction of 80% by week 12 Date Initiated: 05/09/2019 Date Inactivated: 06/13/2019 Target Resolution Date: 06/09/2019 Unmet Reason: comorbities/new Goal Status: Unmet wounds Ulcer/skin breakdown will heal within 14 weeks Date Initiated: 06/13/2019 Date Inactivated: 07/11/2019 Target Resolution Date: 07/07/2019 Goal Status: Unmet Unmet Reason: comorbityies Interventions: Assess patient/caregiver ability to obtain necessary supplies Assess patient/caregiver ability to perform ulcer/skin care regimen upon admission and as needed Assess ulceration(s) every visit Notes: Electronic Signature(s) Signed: 01/31/2020 4:37:47 PM By: [name] RN Entered By: [name] on 01/30/2020 07:49:47 -------------------------------------------------------------------------------- Pain Assessment Details Patient Name: Date of Service: GERVASE, COLBERG D. 01/30/2020 8:00 A M Medical Record Number: [id number] Patient Account Number: [id number] Date of Birth/Sex: Treating RN: 11-27-1940 (79 y.o. [name] Primary Care Kamorie Aldous: Leanord Asal, [place] RA H Other Clinician: Referring Rena Sweeden: Treating Diogenes Whirley/Extender: Candie Mile, SA RA H Weeks in Treatment: 56 Active Problems Location of Pain Severity and Description of Pain Patient Has Paino No Site Locations Rate the pain. Rate the pain. Current Pain Level: 0 Character of Pain Describe the Pain:  Tender Pain Management and Medication Current Pain Management: Electronic Signature(s) Signed: 01/30/2020 5:30:50 PM By: Baruch Gouty RN, BSN Entered By: Baruch Gouty on 01/30/2020 08:04:01 -------------------------------------------------------------------------------- Patient/Caregiver Education Details Patient Name: Date of Service: Lattie Corns 5/4/2021andnbsp8:00 A M Medical Record Number: [id number] Patient Account Number: [id number] Date of Birth/Gender: Treating  RN: 01-01-1941 (78 y.o. [name]) [name] Primary Care Physician: Leanord Asal, SA RA H Other Clinician: Referring Physician: Treating Physician/Extender: Candie Mile, SA RA H Weeks in Treatment: 67 Education Assessment Education Provided To: Patient Education Topics Provided Wound/Skin Impairment: Methods: Explain/Verbal Responses: State content correctly Electronic Signature(s) Signed: 01/31/2020 4:37:47 PM By: [name] RN Entered By: [name] on 01/30/2020 07:50:02 -------------------------------------------------------------------------------- Wound Assessment Details Patient Name: Date of Service: JOSHUWA, VECCHIO D. 01/30/2020 8:00 A M Medical Record Number: [id number] Patient Account Number: [id number] Date of Birth/Sex: Treating RN: [birth year] (79 y.o. [name] Primary Care Tennyson Kallen: Other Clinician: Leanord Asal, SA RA H Referring Terri Rorrer: Treating Natsuko Kelsay/Extender: Candie Mile, SA RA H Weeks in Treatment: 48 Wound Status Wound Number: 25 Primary Venous Leg Ulcer Etiology: Wound Location: Left, Distal, Posterior Lower Leg Secondary Diabetic Wound/Ulcer of the Lower Extremity Wounding Event: Gradually Appeared Etiology: Date Acquired: 10/04/2019 Wound Open Weeks Of Treatment: 16 Status: Clustered Wound: Yes Comorbid Cataracts, Hypertension, Peripheral Venous Disease, Type II History: Diabetes, Gout, Received Radiation Photos Photo Uploaded By: Mikeal Hawthorne on 01/31/2020 08:26:32 Wound Measurements Length: (cm) Width: (cm) Depth: (cm) Clustered Quantity: Area: (cm) Volume: (cm) 1 % Reduction in Area: 98.4% 0.5 % Reduction in Volume: 96.8% 0.2 Epithelialization: Small (1-33%) 1 Tunneling: No 0.393 Undermining: No 0.079 Wound Description Classification: Full Thickness Without Exposed Support Str Wound Margin: Flat and Intact Exudate Amount: Small Exudate Type: Serosanguineous Exudate Color: red, brown uctures Foul Odor After  Cleansing: No Slough/Fibrino Yes Wound Bed Granulation Amount: Medium (34-66%) Exposed Structure Granulation Quality: Red Fascia Exposed: No Necrotic Amount: Medium (34-66%) Fat Layer (Subcutaneous Tissue) Exposed: Yes Necrotic Quality: Adherent Slough Tendon Exposed: No Muscle Exposed: No Joint Exposed: No Bone Exposed: No Treatment Notes Wound #25 (Left, Distal, Posterior Lower Leg) 1. Cleanse With Wound Cleanser Soap and water 2. Periwound Care Moisturizing lotion TCA Cream 3. Primary Dressing Applied Hydrofera Blue 4. Secondary Dressing Dry Gauze 6. Support Layer Applied Kerlix/Coban Notes netting. Electronic Signature(s) Signed: 01/30/2020 5:30:50 PM By: Baruch Gouty RN, BSN Entered By: Baruch Gouty on 01/30/2020 08:09:09 -------------------------------------------------------------------------------- [place] Details Patient Name: Date of Service: [name] 01/30/2020 8:00 A M Medical Record Number: [id number] Patient Account Number: [id number] Date of Birth/Sex: Treating RN: [birth year] (79 y.o. [name] Primary Care Neco Kling: Leanord Asal, [place] RA H Other Clinician: Referring Chistina Roston: Treating Cianna Kasparian/Extender: Candie Mile, SA RA H Weeks in Treatment: 48 Vital Signs Time Taken: 07:58 Temperature (F): 97.8 Height (in): 74 Pulse (bpm): 68 Source: Stated Respiratory Rate (breaths/min): 18 Weight (lbs): 212 Blood Pressure (mmHg): 114/47 Source: Stated Reference Range: 80 - 120 mg / dl Body Mass Index (BMI): 27.2 Electronic Signature(s) Signed: 01/30/2020 5:30:50 PM By: Baruch Gouty RN, BSN Entered By: Baruch Gouty on 01/30/2020 07:59:09

## 2020-02-06 ENCOUNTER — Other Ambulatory Visit: Payer: Self-pay

## 2020-02-06 ENCOUNTER — Encounter (HOSPITAL_BASED_OUTPATIENT_CLINIC_OR_DEPARTMENT_OTHER): Payer: Medicare HMO | Admitting: Internal Medicine

## 2020-02-06 DIAGNOSIS — I10 Essential (primary) hypertension: Secondary | ICD-10-CM | POA: Diagnosis not present

## 2020-02-06 DIAGNOSIS — E1151 Type 2 diabetes mellitus with diabetic peripheral angiopathy without gangrene: Secondary | ICD-10-CM | POA: Diagnosis not present

## 2020-02-06 DIAGNOSIS — I872 Venous insufficiency (chronic) (peripheral): Secondary | ICD-10-CM | POA: Diagnosis not present

## 2020-02-06 DIAGNOSIS — I429 Cardiomyopathy, unspecified: Secondary | ICD-10-CM | POA: Diagnosis not present

## 2020-02-06 DIAGNOSIS — I87333 Chronic venous hypertension (idiopathic) with ulcer and inflammation of bilateral lower extremity: Secondary | ICD-10-CM | POA: Diagnosis not present

## 2020-02-06 DIAGNOSIS — E1142 Type 2 diabetes mellitus with diabetic polyneuropathy: Secondary | ICD-10-CM | POA: Diagnosis not present

## 2020-02-06 DIAGNOSIS — M109 Gout, unspecified: Secondary | ICD-10-CM | POA: Diagnosis not present

## 2020-02-06 DIAGNOSIS — L97221 Non-pressure chronic ulcer of left calf limited to breakdown of skin: Secondary | ICD-10-CM | POA: Diagnosis not present

## 2020-02-06 DIAGNOSIS — Z7901 Long term (current) use of anticoagulants: Secondary | ICD-10-CM | POA: Diagnosis not present

## 2020-02-06 DIAGNOSIS — I482 Chronic atrial fibrillation, unspecified: Secondary | ICD-10-CM | POA: Diagnosis not present

## 2020-02-06 NOTE — Progress Notes (Signed)
[name] ([id number]) , Visit Report for 02/06/2020 Arrival Information Details Patient Name: Date of Service: [name], [name] 02/06/2020 8:00 A M Medical Record Number: [id number] Patient Account Number: [id number] Date of Birth/Sex: Treating RN: [birth year] (79 y.o. [name] Primary Care Dallis Czaja: Leanord Asal, SA RA H Other Clinician: Referring Jimmye Wisnieski: Treating Caine Barfield/Extender: Candie Mile, SA RA H Weeks in Treatment: 17 Visit Information History Since Last Visit Added or deleted any medications: No Patient Arrived: Wheel Chair Any new allergies or adverse reactions: No Arrival Time: 08:14 Had a fall or experienced change in No Accompanied By: spouse activities of daily living that may affect Transfer Assistance: None risk of falls: Patient Identification Verified: Yes Signs or symptoms of abuse/neglect since last visito No Secondary Verification Process Completed: Yes Hospitalized since last visit: No Patient Requires Transmission-Based Precautions: No Implantable device outside of the clinic excluding No Patient Has Alerts: Yes cellular tissue based products placed in the center Patient Alerts: Patient on Blood Thinner since last visit: Has Dressing in Place as Prescribed: Yes Has Compression in Place as Prescribed: Yes Pain Present Now: No Electronic Signature(s) Signed: 02/06/2020 5:10:43 PM By: Baruch Gouty RN, BSN Entered By: Baruch Gouty on 02/06/2020 08:14:57 -------------------------------------------------------------------------------- Compression Therapy Details Patient Name: Date of Service: Daniel Phenix D. 02/06/2020 8:00 A M Medical Record Number: [id number] Patient Account Number: [id number] Date of Birth/Sex: Treating RN: 08/13/1941 (78 y.o. [name]) [name] Primary Care Thaddaeus Granja: Leanord Asal, SA RA H Other Clinician: Referring Irven Ingalsbe: Treating Meshulem Onorato/Extender: Candie Mile, SA RA H Weeks in Treatment:  49 Compression Therapy Performed for Wound Assessment: Wound #25 Left,Distal,Posterior Lower Leg Performed By: Jake Church, RN Compression Type: Three Layer Post Procedure Diagnosis Same as Pre-procedure Electronic Signature(s) Signed: 02/06/2020 5:53:12 PM By: [name] RN Entered By: [name] on 02/06/2020 08:35:50 -------------------------------------------------------------------------------- Encounter Discharge Information Details Patient Name: Date of Service: Daniel Phenix D. 02/06/2020 8:00 A M Medical Record Number: [id number] Patient Account Number: [id number] Date of Birth/Sex: Treating RN: 1941-08-24 (79 y.o. [name] Primary Care Breannah Kratt: Leanord Asal, SA RA H Other Clinician: Referring Aliea Bobe: Treating Reznor Ferrando/Extender: Candie Mile, SA RA H Weeks in Treatment: 6 Encounter Discharge Information Items Post Procedure Vitals Discharge Condition: Stable Temperature (F): 97.8 Ambulatory Status: Wheelchair Pulse (bpm): 71 Discharge Destination: Home Respiratory Rate (breaths/min): 18 Transportation: Private Auto Blood Pressure (mmHg): 123/63 Accompanied By: wife Schedule Follow-up Appointment: Yes Clinical Summary of Care: Patient Declined Electronic Signature(s) Signed: 02/06/2020 5:14:55 PM By: Kela Millin Entered By: Kela Millin on 02/06/2020 08:50:54 -------------------------------------------------------------------------------- Lower Extremity Assessment Details Patient Name: Date of Service: [name], Daniel D. 02/06/2020 8:00 A M Medical Record Number: [id number] Patient Account Number: [id number] Date of Birth/Sex: Treating RN: Apr 29, 1941 (79 y.o. [name] Primary Care Lihanna Biever: Leanord Asal, SA RA H Other Clinician: Referring Jami Bogdanski: Treating Treven Holtman/Extender: Candie Mile, SA RA H Weeks in Treatment: 49 Edema Assessment Assessed: [Left: No] [Right: No] Edema: [Left: N] [Right:  o] Calf Left: Right: Point of Measurement: 31 cm From Medial Instep 31.8 cm cm Ankle Left: Right: Point of Measurement: 11 cm From Medial Instep 19.8 cm cm Vascular Assessment Pulses: Dorsalis Pedis Palpable: [Left:Yes] Electronic Signature(s) Signed: 02/06/2020 5:10:43 PM By: Baruch Gouty RN, BSN Entered By: Baruch Gouty on 02/06/2020 03:54:65 -------------------------------------------------------------------------------- Multi Wound Chart Details Patient Name: Date of Service: Daniel Phenix D. 02/06/2020 8:00 A M Medical Record Number: [id number] Patient Account Number:  [id number] Date of Birth/Sex: Treating RN: April 19, 1941 (78 y.o. [name]) [name] Primary Care Caidance Sybert: Leanord Asal, SA RA H Other Clinician: Referring Halona Amstutz: Treating Herve Haug/Extender: Candie Mile, SA RA H Weeks in Treatment: 68 Vital Signs Height(in): 74 Pulse(bpm): 71 Weight(lbs): 212 Blood Pressure(mmHg): 123/63 Body Mass Index(BMI): 27 Temperature(F): 97.8 Respiratory Rate(breaths/min): 18 Photos: [25:No Photos Left, Distal, Posterior Lower Leg] [N/A:N/A N/A] Wound Location: [25:Gradually Appeared] [N/A:N/A] Wounding Event: [25:Venous Leg Ulcer] [N/A:N/A] Primary Etiology: [25:Diabetic Wound/Ulcer of the Lower] [N/A:N/A] Secondary Etiology: [25:Extremity Cataracts, Hypertension, Peripheral] [N/A:N/A] Comorbid History: [25:Venous Disease, Type II Diabetes, Gout, Received Radiation 10/04/2019] [N/A:N/A] Date Acquired: [25:17] [N/A:N/A] Weeks of Treatment: [25:Open] [N/A:N/A] Wound Status: [25:Yes] [N/A:N/A] Clustered Wound: [25:1] [N/A:N/A] Clustered Quantity: [25:1.1x1.3x0.1] [N/A:N/A] Measurements L x W x D (cm) [25:1.123] [N/A:N/A] A (cm) : rea [25:0.112] [N/A:N/A] Volume (cm) : [25:95.50%] [N/A:N/A] % Reduction in A [25:rea: 95.50%] [N/A:N/A] % Reduction in Volume: [25:Full Thickness Without Exposed] [N/A:N/A] Classification: [25:Support Structures Small] [N/A:N/A] Exudate  A mount: [25:Serosanguineous] [N/A:N/A] Exudate Type: [25:red, brown] [N/A:N/A] Exudate Color: [25:Flat and Intact] [N/A:N/A] Wound Margin: [25:Large (67-100%)] [N/A:N/A] Granulation A mount: [25:Red] [N/A:N/A] Granulation Quality: [25:Small (1-33%)] [N/A:N/A] Necrotic A mount: [25:Fat Layer (Subcutaneous Tissue)] [N/A:N/A] Exposed Structures: [25:Exposed: Yes Fascia: No Tendon: No Muscle: No Joint: No Bone: No Small (1-33%)] [N/A:N/A] Epithelialization: [25:Debridement - Excisional] [N/A:N/A] Debridement: Pre-procedure Verification/Time Out 08:29 [N/A:N/A] Taken: [25:Lidocaine 5% topical ointment] [N/A:N/A] Pain Control: [25:Subcutaneous, Slough] [N/A:N/A] Tissue Debrided: [25:Skin/Subcutaneous Tissue] [N/A:N/A] Level: [25:1.43] [N/A:N/A] Debridement A (sq cm): [25:rea Curette] [N/A:N/A] Instrument: [25:Moderate] [N/A:N/A] Bleeding: [25:Pressure] [N/A:N/A] Hemostasis A chieved: [25:2] [N/A:N/A] Procedural Pain: [25:0] [N/A:N/A] Post Procedural Pain: [25:Procedure was tolerated well] [N/A:N/A] Debridement Treatment Response: [25:1.1x1.3x0.1] [N/A:N/A] Post Debridement Measurements L x W x D (cm) [25:0.112] [N/A:N/A] Post Debridement Volume: (cm) [25:Compression Therapy] [N/A:N/A] Procedures Performed: [25:Debridement] Treatment Notes Electronic Signature(s) Signed: 02/06/2020 5:53:12 PM By: [name] RN Signed: 02/06/2020 6:13:26 PM By: [name] MD Entered By: [name] on 02/06/2020 08:35:52 -------------------------------------------------------------------------------- Multi-Disciplinary Care Plan Details Patient Name: Date of Service: Daniel Phenix D. 02/06/2020 8:00 A M Medical Record Number: [id number] Patient Account Number: [id number] Date of Birth/Sex: Treating RN: [birth year] (78 y.o. [name]) [name] Primary Care Cheryl Chay: Leanord Asal, SA RA H Other Clinician: Referring Juliann Olesky: Treating Gillian Meeuwsen/Extender: Candie Mile, SA RA H Weeks in  Treatment: 60 Active Inactive Wound/Skin Impairment Nursing Diagnoses: Knowledge deficit related to ulceration/compromised skin integrity Goals: Patient/caregiver will verbalize understanding of skin care regimen Date Initiated: 02/28/2019 Target Resolution Date: 03/01/2020 Goal Status: Active Ulcer/skin breakdown will have a volume reduction of 30% by week 4 Date Initiated: 02/28/2019 Date Inactivated: 04/04/2019 Target Resolution Date: 03/31/2019 Goal Status: Met Ulcer/skin breakdown will have a volume reduction of 50% by week 8 Date Initiated: 04/04/2019 Date Inactivated: 05/09/2019 Target Resolution Date: 05/05/2019 Goal Status: Met Ulcer/skin breakdown will have a volume reduction of 80% by week 12 Date Initiated: 05/09/2019 Date Inactivated: 06/13/2019 Target Resolution Date: 06/09/2019 Unmet Reason: comorbities/new Goal Status: Unmet wounds Ulcer/skin breakdown will heal within 14 weeks Date Initiated: 06/13/2019 Date Inactivated: 07/11/2019 Target Resolution Date: 07/07/2019 Goal Status: Unmet Unmet Reason: comorbityies Interventions: Assess patient/caregiver ability to obtain necessary supplies Assess patient/caregiver ability to perform ulcer/skin care regimen upon admission and as needed Assess ulceration(s) every visit Notes: Electronic Signature(s) Signed: 02/06/2020 5:53:12 PM By: [name] RN Entered By: [name] on 02/06/2020 08:13:27 -------------------------------------------------------------------------------- Pain Assessment Details Patient Name: Date of Service: [name], Daniel D. 02/06/2020 8:00 A M Medical Record Number: [id number]  Patient Account Number: [id number] Date of Birth/Sex: Treating RN: 1941-04-17 (79 y.o. [name] Primary Care Tania Perrott: Leanord Asal, [place] RA H Other Clinician: Referring Duy Lemming: Treating Cay Kath/Extender: Candie Mile, SA RA H Weeks in Treatment: 19 Active Problems Location of Pain Severity and Description of  Pain Patient Has Paino No Site Locations Rate the pain. Current Pain Level: 0 Pain Management and Medication Current Pain Management: Electronic Signature(s) Signed: 02/06/2020 5:10:43 PM By: Baruch Gouty RN, BSN Entered By: Baruch Gouty on 02/06/2020 08:18:08 -------------------------------------------------------------------------------- Patient/Caregiver Education Details Patient Name: Date of Service: [name] 5/11/2021andnbsp8:00 A M Medical Record Number: [id number] Patient Account Number: [id number] Date of Birth/Gender: Treating RN: 1940-11-16 (78 y.o. [name]) [name] Primary Care Physician: Leanord Asal, SA RA H Other Clinician: Referring Physician: Treating Physician/Extender: Candie Mile, SA RA H Weeks in Treatment: 49 Education Assessment Education Provided To: Patient Education Topics Provided Wound/Skin Impairment: Methods: Explain/Verbal Responses: State content correctly Electronic Signature(s) Signed: 02/06/2020 5:53:12 PM By: [name] RN Entered By: [name] on 02/06/2020 08:13:43 -------------------------------------------------------------------------------- Wound Assessment Details Patient Name: Date of Service: [name], Daniel D. 02/06/2020 8:00 A M Medical Record Number: [id number] Patient Account Number: [id number] Date of Birth/Sex: Treating RN: 03-11-1941 (79 y.o. [name] Primary Care Leina Babe: Leanord Asal, [place] RA H Other Clinician: Referring Kaidyn Hernandes: Treating Danine Hor/Extender: Candie Mile, SA RA H Weeks in Treatment: 49 Wound Status Wound Number: 25 Primary Venous Leg Ulcer Etiology: Wound Location: Left, Distal, Posterior Lower Leg Secondary Diabetic Wound/Ulcer of the Lower Extremity Wounding Event: Gradually Appeared Etiology: Date Acquired: 10/04/2019 Wound Open Weeks Of Treatment: 17 Status: Clustered Wound: Yes Comorbid Cataracts, Hypertension, Peripheral Venous Disease, Type II History:  Diabetes, Gout, Received Radiation Wound Measurements Length: (cm) Width: (cm) Depth: (cm) Clustered Quantity: Area: (cm) Volume: (cm) 1.1 % Reduction in Area: 95.5% 1.3 % Reduction in Volume: 95.5% 0.1 Epithelialization: Small (1-33%) 1 Tunneling: No 1.123 Undermining: No 0.112 Wound Description Classification: Full Thickness Without Exposed Support Structures Wound Margin: Flat and Intact Exudate Amount: Small Exudate Type: Serosanguineous Exudate Color: red, brown Foul Odor After Cleansing: No Slough/Fibrino Yes Wound Bed Granulation Amount: Large (67-100%) Exposed Structure Granulation Quality: Red Fascia Exposed: No Necrotic Amount: Small (1-33%) Fat Layer (Subcutaneous Tissue) Exposed: Yes Necrotic Quality: Adherent Slough Tendon Exposed: No Muscle Exposed: No Joint Exposed: No Bone Exposed: No Treatment Notes Wound #25 (Left, Distal, Posterior Lower Leg) 1. Cleanse With Wound Cleanser Soap and water 2. Periwound Care Moisturizing lotion TCA Ointment 3. Primary Dressing Applied Polymem Ag 4. Secondary Dressing Dry Gauze 6. Support Layer Applied Kerlix/Coban Notes netting. Electronic Signature(s) Signed: 02/06/2020 5:10:43 PM By: Baruch Gouty RN, BSN Entered By: Baruch Gouty on 02/06/2020 08:25:30 -------------------------------------------------------------------------------- Vitals Details Patient Name: Date of Service: Daniel Phenix D. 02/06/2020 8:00 A M Medical Record Number: [id number] Patient Account Number: [id number] Date of Birth/Sex: Treating RN: 1941-02-02 (79 y.o. [name] Primary Care Barbara Ahart: Leanord Asal, [place] RA H Other Clinician: Referring Cletus Paris: Treating Carren Blakley/Extender: Candie Mile, SA RA H Weeks in Treatment: 27 Vital Signs Time Taken: 08:15 Temperature (F): 97.8 Height (in): 74 Pulse (bpm): 71 Source: Stated Respiratory Rate (breaths/min): 18 Weight (lbs): 212 Blood Pressure (mmHg):  123/63 Source: Stated Reference Range: 80 - 120 mg / dl Body Mass Index (BMI): 27.2 Electronic Signature(s) Signed: 02/06/2020 5:10:43 PM By: Baruch Gouty RN, BSN Entered By: Baruch Gouty on 02/06/2020 08:17:57

## 2020-02-06 NOTE — Progress Notes (Signed)
[name] ([id number]) , Visit Report for 02/06/2020 Debridement Details Patient Name: Date of Service: [name], [name] 02/06/2020 8:00 A M Medical Record Number: [id number] Patient Account Number: [id number] Date of Birth/Sex: Treating RN: 1941-06-27 (78 y.o. [name]) [name] Primary Care Provider: Leanord Asal, SA RA H Other Clinician: Referring Provider: Treating Provider/Extender: Candie Mile, SA RA H Weeks in Treatment: 20 Debridement Performed for Assessment: Wound #25 Left,Distal,Posterior Lower Leg Performed By: Physician Ricard Dillon., MD Debridement Type: Debridement Severity of Tissue Pre Debridement: Fat layer exposed Level of Consciousness (Pre-procedure): Awake and Alert Pre-procedure Verification/Time Out Yes - 08:29 Taken: Start Time: 08:29 Pain Control: Lidocaine 5% topical ointment T Area Debrided (L x W): otal 1.1 (cm) x 1.3 (cm) = 1.43 (cm) Tissue and other material debrided: Viable, Non-Viable, Slough, Subcutaneous, Skin: Dermis , Skin: Epidermis, Slough Level: Skin/Subcutaneous Tissue Debridement Description: Excisional Instrument: Curette Bleeding: Moderate Hemostasis Achieved: Pressure End Time: 08:31 Procedural Pain: 2 Post Procedural Pain: 0 Response to Treatment: Procedure was tolerated well Level of Consciousness (Post- Awake and Alert procedure): Post Debridement Measurements of Total Wound Length: (cm) 1.1 Width: (cm) 1.3 Depth: (cm) 0.1 Volume: (cm) 0.112 Character of Wound/Ulcer Post Debridement: Improved Severity of Tissue Post Debridement: Fat layer exposed Post Procedure Diagnosis Same as Pre-procedure Electronic Signature(s) Signed: 02/06/2020 5:53:12 PM By: [name] RN Signed: 02/06/2020 6:13:26 PM By: [name] MD Entered By: [name] on 02/06/2020 08:36:08 -------------------------------------------------------------------------------- HPI Details Patient Name: Date of Service: Daniel Phenix  D. 02/06/2020 8:00 A M Medical Record Number: [id number] Patient Account Number: [id number] Date of Birth/Sex: Treating RN: March 05, 1941 (78 y.o. [name] Primary Care Provider: Leanord Asal, SA RA H Other Clinician: Referring Provider: Treating Provider/Extender: Candie Mile, SA RA H Weeks in Treatment: 6 History of Present Illness Location: Patient presents with a wound to left lower leg. Quality: Patient reports No Pain. Duration: 2 months HPI Description: no cig or alcohol. spontaneous appearance in area of stasis dermamtitis. [name] on metformin only. chronic afib on Coumadin. diabetes and coag studies not good. hba1c 7.5. ivr 4.5. no pain or sxs of systemic disease. hx chf. no intermittent claudication 02/28/2019 Readmission This is a now a 79 year old man who was previously cared for in 2016 by Dr. Lindon Romp for wounds on his lower extremities. At that point he had venous reflux studies although I cannot seem to open these in [place] link. He had arterial studies showing an ABI of 1.11 on the right and 1.27 on the left his waveforms were triphasic bilaterally. He was discharged in stockings although I do not believe he is wearing these in some time. He tells me that about a month ago he noted openings of a large wound on the posterior right calf and 2 smaller areas on the left lateral calf and a small area more recently on the left posterior calf. He has been dressing these with peroxide and triple antibiotic ointment. He is not wearing compression. Past medical history; type 2 diabetes with peripheral neuropathy, chronic venous insufficiency, hypertension, cardiomyopathy, chronic atrial fibrillation on Coumadin, prostate cancer, hyperlipidemia, gout, ABI in our clinic was 1.34 on the left and not obtainable on the right 6/9; this is a patient who has chronic venous insufficiency. He has a fairly substantial area on the right posterior calf, left lateral calf and a small area on  the left posterior calf. On arrival last week he had very palpable popliteal and femoral pulses  but nothing in his bilateral feet. Unfortunately we cannot get arterial studies until July 1 at Dr. Kennon Holter office. They live in [place]. We use silver alginate under Kerlix Coban 6/16; patient with chronic venous insufficiency with wounds on his bilateral lower extremities. When he came into our clinic he was discovered to have a complete absence of peripheral pedal pulses at either the dorsalis pedis or posterior tibial. He does have easily palpable femoral and popliteal pulses. He sees Dr. [name] tomorrow for noninvasive arterial tests. He may also require venous reflux evaluation although I do not view this as an urgent thing. We have been using silver alginate. His wound surfaces of cleaned up quite nicely 6/23; patient with chronic venous insufficiency with wounds on his bilateral lower extremities. His wounds all are somewhat better looking. He did go to Dr. Kennon Holter office but somehow ended up on the doctors schedule rather than being scheduled for noninvasive tests therefore his noninvasive tests are scheduled for July 1. We agree that he has venous insufficiency ulcers but I cannot feel any pulses in his lower extremities dictating the need for test. We are only using Kerlix and light Coban unfortunately this appears to be holding the edema 6/30; has his arterial studies tomorrow. We have been using Kerlix and light Coban will go to a more aggressive compression if the arterial studies will allow. We all agreed these are venous wounds however I cannot feel pulses at either the dorsalis pedis or posterior tibial bilaterally. His wounds generally look some better including left lateral and right posterior. 7/7-Patient returns at 1 week in Kerlix/Coban to both legs, with improvement, in the left lateral and right posterior lower leg wounds, ABI's are normal in both legs per vascular studies, TBI is  also normal on both sides, we are using hydrofera blue to the wounds 7/14; patient's arterial studies from 2 weeks ago showed an ABI on the right at 1.03 with a TBI of 0.86. On the left the ABI was 1.06 with a TBI of 0.84. Notable for the fact that his arterial waveforms were monophasic in all of the lower extremity arteries suggesting some degree of arterial occlusive disease but in general this was felt to be fairly adequate for healing. His compression was increased from 2-3 layer which is appropriate. Dressing was changed to [facility] 7/21; patient's wounds are measuring smaller. The more substantial one on the right posterior calf, second 1 on the left lateral calf. Using [facility] on both wound areas 7/28; patient continues to make nice improvements. The area on the right posterior calf is smaller. Area on the left lateral calf also is smaller. We have been using Hydrofera Blue under compression. The patient will need compression stockings and we have measured him for these in the eventuality that these heal which really should not be too long from now 8/4-Patient continues to make improvement, the right posterior calf area smaller with rim of keratotic skin on one side, the left wound is definitely smaller and improving. 8/11-Returns at 1 week, after being in 3 layer compression on both legs, both wounds appear to be improving, making good progress, patient is happy, pain is also less especially in the right leg wound 8/18; the area on the left anterior lower leg is healed. On the right posterior leg the wound remains although the dimensions are a lot better. 8/25; he arrives in clinic today with a large body of open wound on the left lateral calf. All of the 3  wounds in this area are in close juxtaposition to each other. The story is that we discharged him last week with no a wrap on the left leg. They went to [place] could not get in as they are only excepting phone orders  or online orders for stockings hence they did not put any stocking on the left leg all week. They have something at home but the patient with that was either incapable or just did not put them on. Apparently these opened 1 morning after getting out of bed. The area on the right has no real change 9/1; patient has bilateral lower extremity wounds in the setting of severe chronic venous insufficiency and secondary lymphedema. He arrived last week with new areas on the left lateral lower leg after we did not wrap him and he did not use his stockings. Nevertheless the areas on the left look better today under compression. Posterior right calf does not really changed. We are using Hydrofera Blue on both areas under compression 9/15; bilateral lower extremity wounds in the setting of severe chronic venous insufficiency and secondary lymphedema. He has 20 to 30 mmHg below-knee compression stockings under the eventuality that these close over. We did get the left leg to close but he did not transition to a stocking and this reopened. There are 2 open areas on the left posterior lateral calf and one on the right. Both of these look satisfactory. Using [facility] 9/22; bilateral lower extremity wounds in the setting of chronic venous insufficiency. 2 superficial areas on the left lateral calf. One on the right just above the Achilles area. We have good edema control we have been using Hydrofera Blue 9/29; the areas on the left lateral calf are healed. On the right just above the Achilles and tendon area things look a lot better small wounds one scabbed area. We have been using Hydrofera Blue. We can discharge him in his own stocking on the left still wrapping on the right. This is the second time we have healed the left leg but he did not put a stocking on last time. Hopefully this will maintain the edema from chronic venous disease with secondary lymphedema 10/6; he comes in today having a stocking on the  left leg. They had trouble getting it on there is a lot of increase in swelling 2 small open areas one anteriorly and one on the medial calf. They report a lot of difficulty getting the stocking on. Paradoxically the area on the right that we have been wrapping posteriorly is closed 10/13; he comes in today with wounds bilaterally including superficial areas on the left medial and left lateral calf. As well as the right posterior has reopened in the Achilles area superiorly. He still does not have his juxta lite stockings although truthfully we would not of been able to use them today anyway. Apparently have been ordered and paid for from [employer] although they have not been delivered 10/20; his area on the right is just the boat closed on the right posterior. Still has the area on the left lateral and a very tiny area on the left medial. He has his bilateral juxta lites although he is not ready for them this week. He tolerated the increase to 4 layer compression last week quite well 10/27; the area on the right posterior calf is once again closed. He has a superficial area on the left lateral calf that is still open. He has been using Hydrofera Blue and bilateral 4-layer  compression. He can change to his own juxta lite stocking on the right and we are instructing him today 11/3; the area on the right posterior calf reopened according to the patient and his wife after they took off the stocking when they got home last week. Apparently scabbed over there is now a fairly substantial wound which looks pretty much the same. Our intake nurse noted that they were using the juxta lite stockings appropriately. I was really hoping I might be able to close him out today. He has 1 very tiny remaining area on the left lateral lower leg. 11/10; right posterior calf wound measures smaller but is still open. We have been using Hydrofera Blue. On the left he has a small Daniel-shaped wound and he seems to have had another  wound distally that is open and likely a blister. We are using Hydrofera Blue under compression 11/17; right posterior calf wound continues to get better. We have been using Hydrofera Blue. On the left lateral one of the wounds has closed still a small open area. We have been using Hydrofera Blue on this as well. Both areas have been under 4-layer compression Arrives in clinic today with some swelling in the dorsal foot on the right some erythema of his forefoot and toes. Initially when I looked at this I almost thought this was a sunburn distal to a wrap injury. 12/1; right posterior calf wound debrided with a curette. We have been using Hydrofera Blue on the left anterior lateral he has an area across the mid tibia. Finally a small area on the left lateral lower calf. Finally he continues to have de-epithelialized areas on the dorsal aspect of his toes. Initially thought this might be a burn injury when I saw him 2 weeks ago. I now wonder about tinea. I have also reviewed his arterial studies which were really quite good in July/20 with normal TBI's and ABIs but monophasic waveforms 12/8; comes in today with worsening problems especially on the left leg where he now has a cluster of wounds in the left anterior mid tibia. Very poor edema control. I reduced him to 3 layer from 4 layer compression last week because of some concern about blood flow to his toes however he does not have good edema control on the left leg. Right leg edema control looks satisfactory. On the left he has a cluster of wounds anteriorly, small area on the left medial fifth met head and then the collection of areas on his toes which appear better On the right he has the original area on the right posterior calf, a new area right medially. His formal arterial studies from mid July are noted below. He was evaluated by Dr.Berry ABI Findings: +---------+------------------+-----+----------+--------+ Right Rt Pressure  (mmHg)IndexWaveform Comment  +---------+------------------+-----+----------+--------+ Brachial 176     +---------+------------------+-----+----------+--------+ ATA 176 0.99 monophasic  +---------+------------------+-----+----------+--------+ PTA 183 1.03 monophasic  +---------+------------------+-----+----------+--------+ PERO 172 0.97 monophasic  +---------+------------------+-----+----------+--------+ Great T oe153 0.86    +---------+------------------+-----+----------+--------+ +---------+------------------+-----+-----------+-------+ Left Lt Pressure (mmHg)IndexWaveform Comment +---------+------------------+-----+-----------+-------+ Brachial 178     +---------+------------------+-----+-----------+-------+ ATA 162 0.91 multiphasic  +---------+------------------+-----+-----------+-------+ PTA 188 1.06 multiphasic  +---------+------------------+-----+-----------+-------+ PERO 158 0.89 monophasic   +---------+------------------+-----+-----------+-------+ Great T oe150 0.84    +---------+------------------+-----+-----------+-------+ +-------+-----------+-----------+------------+------------+ ABI/TBIT oday's ABIT oday's TBIPrevious ABIPrevious TBI +-------+-----------+-----------+------------+------------+ Right 1.03 0.86 1.11   +-------+-----------+-----------+------------+------------+ Left 1.06 0.84 1.27   +-------+-----------+-----------+------------+------------+ Tibial waveforms somewhat difficult to record due to irregular heartbeat. Bilateral ABIs appear essentially unchanged compared to prior study on 06/21/15. Summary: Right: Resting right ankle-brachial  index is within normal range. No evidence of significant right lower extremity arterial disease. The right toe-brachial index is normal. Although ankle brachial indices are within normal limits (0.95-1.29), arterial Doppler waveforms at  the ankle suggest some component of arterial occlusive disease. Left: Resting left ankle-brachial index is within normal range. No evidence of significant left lower extremity arterial disease. The left toe-brachial index is normal. Although ankle brachial indices are within normal limits (0.95-1.29), arterial Doppler waveforms at the ankle suggest some component of arterial occlusive disease. 12/15; the patient's area on the left mid tibia looks better. Right posterior calf also better. He has the area on the left foot as well. All of his toes look better I think this was tinea. We are using Hydrofera Blue everywhere else The patient was in urgent care yesterday with wheezing and shortness of breath. He got azithromycin and prednisone. He feels better. His lungs are currently clear to auscultation. He was not tested for Covid 19 12/29; the patient arrives in clinic today with quite a bit change. 2 weeks ago he only had areas on the left mid tibia right posterior calf with tinea pedis resolving between his toes. He arrives in clinic today with several areas on the dorsal toes on the right, dorsal left second toe. He has skin breakdown in the left medial calf probably from excess edema. Small area proximally in the medial calf. He has weeping edema fluid coming out of the skin excoriations on the left medial calf. He tells me that he is having a cardiac catheterization next week. I had a quick look at [facility] health link. He was found to have an ejection fraction of 25% during the work-up for persistent atrial fibrillation. He saw his cardiology office yesterday seen by the nurse practitioner. She increased his carvedilol. He has not been on diuretics for apparently several months and indeed in the nurse practitioner Dietrich Pates notes she had knowledge of this. 10/04/2019 on evaluation today patient presents as a walk-in visit concerning the fact that he did not have an appointment here for our clinic  at this point. He actually had a cardiac catheterization earlier today and then came from there to here in order to be evaluated. With that being said unfortunately he is having significant cellulitis of his left lower extremity upon evaluation today this appears to have deteriorated even since last week's evaluation with Dr. Dellia Nims. The right lower extremity is actually doing okay I really see no evidence of deterioration at this point at those locations. In fact the right leg seems in general be doing quite well. Nonetheless I am concerned about infection and cellulitis of the left lower extremity and again considering his weakened heart I do not want him to develop into sepsis at all. He is also having some trouble breathing today and I understand according to nursing staff this is always the case to some degree. With that being said the patient unfortunately seems to be in my opinion a little bit worse even his wife feels like that may be the case today. Unsure exactly what is leading to this. Cardiac catheterization I did review the report which showed an ejection fraction of 25% he also had an LAD blockage of around 25% based on what I saw. With that being said there was no significant blockages that required stenting at this point he does have weakened cardiac muscles compared to normal. 10/05/2019; patient was seen yesterday in clinic. He was sent to the ER because  of cellulitis of the left leg possibility. In the ER he was given 1 dose of IV Levaquin and discharged on Keflex. He came in the clinic initially for a nurse visit to rewrap his left leg. We did not look at the right leg today that is an Haematologist. The patient also had a cardiac cath. According to him there were no blockages but a very low ejection fraction. 1/12; back for an early follow-up. The condition of the left lower leg is a lot better although there are multiple open areas. All of them with not a particularly viable surface.  On the right posterior calf he has a single wound with a clean surface. He has a wound with exposed bone on the PIP of the left second toe dorsally. He has wounds on the dorsal right first second and third toes. His arterial studies were normal. 1/19; the patient has 3 open wounds on the left leg anteriorly in the mid tibia, distally and medially just above the ankle and posteriorly. On the right he has a small area dime sized on the right posterior calf. His edema control is a lot better. He still has wounds on the right second and left second toes. The left second toe has exposed bone. X-ray I did last week did not show evidence of osteomyelitis in the left foot. 1/26; patient with a multiplicity of wounds and problems. On the left he has a circular area on the left anterior mid tibia Left just above the medical ankle -left 2nd toe pip -right 2nd toe right posterior calf 2/2; patient with a multiplicity of wounds and problems. He has severe chronic venous insufficiency and the wounds on his leg are all on the left left anterior left medial at the medial malleolus and left posterior. We have been using Iodoflex to these areas to help with ongoing debridement. He also has largely traumatic wounds on his toes this includes the left second with exposed bone. Bone culture I did last week showed Staph aureus which is methicillin sensitive. I discussed with him today the idea of an amputation of this toe because it is literally nonfunctional however he wants to try antibiotics. Antibiotic choice is complicated by the fact that he is on Coumadin. He also has wounds over the dorsal part of the right first which is close to closed. Right second toe has exposed bone and the right third toe at the base of the right third toe is just about closed as well. We have been using silver alginate 2/9; patient with severe chronic venous insufficiency. He has large wounds on the left anterior mid tibia and on the left  medial just above the ankle. I am concerned today about the depth of the area on the left mid tibia may be exposing into the muscle. He has small areas on the left posterior calf and on the right posterior calf although these do not look as threatening. He also has traumatic wounds on his toes. The right first and third are closed however the bilateral second toes have exposed bone. The area on the left is open into the distal interphalangeal joint. In my opinion these toes are nonsalvageable and will need amputation 2/16; patient with severe chronic venous insufficiency. He has wounds on the left anterior mid tibia left medial lower leg just above the ankle. He has small areas on the left posterior calf and a more prominent area on the right posterior calf. All of these are related to  poorly controlled venous hypertension. He also had traumatic wounds on the PIPs of both second toes. Unfortunately both of these have exposed bone and in the case of the left this I think goes right into the joint itself. I do not think either 1 of these is salvageable. I have reviewed his arterial evaluation that was done in July last year. He also saw Dr. [name] in June. He felt these were venous stasis. Previously had segmental arterial pressures performed in 11/03/2014 which were normal. He was sent for for a follow-up arterial evaluation on July 1. On the right his ABIs were 1.03 with a TBI of 0.86 although his waveforms were monophasic on the left he had multiphasic waveforms at the ATA PTA but monophasic at the peroneal nevertheless his ABI was normal at 1.06 TBI also normal at 0.84. I think he has enough blood flow to heal an amputation which I think he needs of the left second toe and probably the right second toe as well 2/23; the patient is going for his bilateral second toe amputation by Dr. Amalia Hailey on Friday. In the meantime is 3 areas on the left leg and the small area on the right posterior look better. We  have been using silver collagen. 3/2; the patient's toe amputations were canceled because of cardiac concerns. Apparently this surgery will need to be done in the hospital although they do not have an appointment. In the meantime he is continues to have 3 areas on the left leg one anteriorly and 2 smaller areas posteriorly on the left as well as the small posterior area on the right. We have been using silver collagen with compression 3/9; still no word on the second toe amputations bilaterally. 3 wounds on the left leg all look better and the area on the right posterior we have been using silver collagen I change this to [facility] 3/16; amputations next Monday both second toes he is apparently going to be hospitalized. He has on his left leg including left anterior and left lateral look better very superficial also the right posterior. 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 He continues to have left leg wounds anteriorly x2 posteriorly x1 at the left lower leg just above the ankle anteriorly x1 and posteriorly on the right x1 3/30; his anterior left leg wounds are considerably smaller. In fact the only wound that is not smaller is on the right. We have been using Hydrofera Blue under Kerlix Cobax 4/6; his anterior left leg wounds continue to contract in fact the area distally and medially has closed over. He still has the area anteriorly on the left. Both posterior calf wounds on the left and right are much smaller. We saw his surgical wound at the left second toe amputation site that still sutured it looks healthy, this is being followed by his podiatrist. We went over what compression he has he has bilateral juxta lites I am hopeful the issue here was that we just did not have them tight enough because he broke down very quickly the last time we healed him out. 4/13; the patient has 3  remaining wounds one small area on the left anterior lower leg one on the left posterior and one on the right anterior. We have been using Hydrofera Blue under compression compression all of these are contracting. We did discuss life in the eventuality he  will totally closed which seems likely in the next week or 2. He has bilateral juxta lite stockings. These will need to be set at 30-40 compression. Since we last closed him over for his bilateral lower extremity wounds he is also been treated for heart failure which may help. He undoubtedly has severe chronic venous hypertension however as well 4/19; the area on the left anterior tibial area is closed. The area on the right posterior calf is just above closed. The area on the left posterior calf has some depth. 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 Everything looks quite good here. He does not need a specific dressing 4/27; the right posterior leg wound is closed there is nothing open on the right. Still has a small punched out area on the left posterior calf. He will be transitioned into the juxta lite stocking on the right today. Still wrapping the left leg 5/4; right leg is still closed although we did not take the juxta lite stocking off. Will do this next week. Small punched out area on the left posterior calf is still not healed we have been using silver collagen under compression 5/11; right leg is still closed although he has dry flaking skin on the dorsal and plantar part of his foot. No real evidence of infection. The one remaining wound on the left posterior calf. This is actually measuring somewhat larger we have been using Hydrofera Blue Electronic Signature(s) Signed: 02/06/2020 6:13:26 PM By: [name] MD Entered By: [name] on 02/06/2020  08:40:33 -------------------------------------------------------------------------------- Physical Exam Details Patient Name: Date of Service: Daniel Phenix D. 02/06/2020 8:00 A M Medical Record Number: [id number] Patient Account Number: [id number] Date of Birth/Sex: Treating RN: October 14, 1940 (78 y.o. [name]) [name] Primary Care Provider: Other Clinician: Leanord Asal, SA RA H Referring Provider: Treating Provider/Extender: Candie Mile, SA RA H Weeks in Treatment: 51 Constitutional Sitting or standing Blood Pressure is within target range for patient.. Pulse regular and within target range for patient.Marland Kitchen Respirations regular, non-labored and within target range.. Temperature is normal and within the target range for the patient.Marland Kitchen Appears in no distress. Notes Wound exam; left posterior calf. Under illumination some debris on the wound surface I used a #3 curette curette to remove this as well as some very fibrinous surface debris. Hemostasis with direct pressure. Wound surface cleans up quite well Electronic Signature(s) Signed: 02/06/2020 6:13:26 PM By: [name] MD Entered By: [name] on 02/06/2020 08:41:12 -------------------------------------------------------------------------------- Physician Orders Details Patient Name: Date of Service: Daniel Phenix D. 02/06/2020 8:00 A M Medical Record Number: [id number] Patient Account Number: [id number] Date of Birth/Sex: Treating RN: 1941/01/29 (78 y.o. [name]) [name] Primary Care Provider: Leanord Asal, SA RA H Other Clinician: Referring Provider: Treating Provider/Extender: Candie Mile, SA RA H Weeks in Treatment: 89 Verbal / Phone Orders: No Diagnosis Coding ICD-10 Coding Code Description L97.221 Non-pressure chronic ulcer of left calf limited to breakdown of skin I87.333 Chronic venous hypertension (idiopathic) with ulcer and inflammation of bilateral lower extremity E11.51 Type 2 diabetes mellitus with  diabetic peripheral angiopathy without gangrene E11.42 Type 2 diabetes mellitus with diabetic polyneuropathy Follow-up Appointments ppointment in 1 week. - Tuesday Return A Dressing Change Frequency Wound #25 Left,Distal,Posterior Lower Leg Do not change entire dressing for one week. Skin Barriers/Peri-Wound Care  TCA Cream or Ointment Wound Cleansing May shower with protection. Primary Wound Dressing Wound #25 Left,Distal,Posterior Lower Leg Polymem Silver Secondary Dressing Dry Gauze - secure toes with tape Edema Control Wound #25 Left,Distal,Posterior Lower Leg Kerlix and Coban - Left Lower Extremity Support Garment 20-30 mm/Hg pressure to: - right lower leg, apply lotion daily with special attention to ankle area Off-Loading Open toe surgical shoe to: - left and right foot Electronic Signature(s) Signed: 02/06/2020 5:53:12 PM By: [name] RN Signed: 02/06/2020 6:13:26 PM By: [name] MD Entered By: [name] on 02/06/2020 08:33:22 -------------------------------------------------------------------------------- Problem List Details Patient Name: Date of Service: Daniel Phenix D. 02/06/2020 8:00 A M Medical Record Number: [id number] Patient Account Number: [id number] Date of Birth/Sex: Treating RN: 1941/02/08 (78 y.o. [name]) [name] Primary Care Provider: Leanord Asal, SA RA H Other Clinician: Referring Provider: Treating Provider/Extender: Candie Mile, SA RA H Weeks in Treatment: 79 Active Problems ICD-10 Encounter Code Description Active Date MDM Diagnosis L97.221 Non-pressure chronic ulcer of left calf limited to breakdown of skin 02/28/2019 No Yes I87.333 Chronic venous hypertension (idiopathic) with ulcer and inflammation of 02/28/2019 No Yes bilateral lower extremity E11.51 Type 2 diabetes mellitus with diabetic peripheral angiopathy without gangrene 02/28/2019 No Yes E11.42 Type 2 diabetes mellitus with diabetic polyneuropathy 02/28/2019 No  Yes Inactive Problems ICD-10 Code Description Active Date Inactive Date L97.211 Non-pressure chronic ulcer of right calf limited to breakdown of skin 02/28/2019 02/28/2019 B35.3 Tinea pedis 09/05/2019 09/05/2019 L97.521 Non-pressure chronic ulcer of other part of left foot limited to breakdown of skin 09/26/2019 09/26/2019 L97.524 Non-pressure chronic ulcer of other part of left foot with necrosis of bone 10/10/2019 10/10/2019 L97.511 Non-pressure chronic ulcer of other part of right foot limited to breakdown of skin 09/26/2019 09/26/2019 W40.97 Chronic systolic (congestive) heart failure 10/05/2019 10/05/2019 D53.299 Other chronic osteomyelitis, left ankle and foot 10/31/2019 10/31/2019 Resolved Problems Electronic Signature(s) Signed: 02/06/2020 6:13:26 PM By: [name] MD Entered By: [name] on 02/06/2020 08:35:42 -------------------------------------------------------------------------------- Progress Note Details Patient Name: Date of Service: Daniel Phenix D. 02/06/2020 8:00 A M Medical Record Number: [id number] Patient Account Number: [id number] Date of Birth/Sex: Treating RN: 1941/09/12 (78 y.o. [name]) [name] Primary Care Provider: Leanord Asal, SA RA H Other Clinician: Referring Provider: Treating Provider/Extender: Candie Mile, SA RA H Weeks in Treatment: 49 Subjective History of Present Illness (HPI) The following HPI elements were documented for the patient's wound: Location: Patient presents with a wound to left lower leg. Quality: Patient reports No Pain. Duration: 2 months no cig or alcohol. spontaneous appearance in area of stasis dermamtitis. [name] on metformin only. chronic afib on Coumadin. diabetes and coag studies not good. hba1c 7.5. ivr 4.5. no pain or sxs of systemic disease. hx chf. no intermittent claudication 02/28/2019 Readmission This is a now a 79 year old man who was previously cared for in 2016 by Dr. Lindon Romp for wounds on his lower extremities. At  that point he had venous reflux studies although I cannot seem to open these in [facility] link. He had arterial studies showing an ABI of 1.11 on the right and 1.27 on the left his waveforms were triphasic bilaterally. He was discharged in stockings although I do not believe he is wearing these in some time. He tells me that about a month ago he noted openings of a large wound on the posterior right calf and 2 smaller areas on the left lateral calf and a small area more recently on the  left posterior calf. He has been dressing these with peroxide and triple antibiotic ointment. He is not wearing compression. Past medical history; type 2 diabetes with peripheral neuropathy, chronic venous insufficiency, hypertension, cardiomyopathy, chronic atrial fibrillation on Coumadin, prostate cancer, hyperlipidemia, gout, ABI in our clinic was 1.34 on the left and not obtainable on the right 6/9; this is a patient who has chronic venous insufficiency. He has a fairly substantial area on the right posterior calf, left lateral calf and a small area on the left posterior calf. On arrival last week he had very palpable popliteal and femoral pulses but nothing in his bilateral feet. Unfortunately we cannot get arterial studies until July 1 at Dr. Kennon Holter office. They live in [place]. We use silver alginate under Kerlix Coban 6/16; patient with chronic venous insufficiency with wounds on his bilateral lower extremities. When he came into our clinic he was discovered to have a complete absence of peripheral pedal pulses at either the dorsalis pedis or posterior tibial. He does have easily palpable femoral and popliteal pulses. He sees Dr. [name] tomorrow for noninvasive arterial tests. He may also require venous reflux evaluation although I do not view this as an urgent thing. We have been using silver alginate. His wound surfaces of cleaned up quite nicely 6/23; patient with chronic venous insufficiency with wounds  on his bilateral lower extremities. His wounds all are somewhat better looking. He did go to Dr. Kennon Holter office but somehow ended up on the doctors schedule rather than being scheduled for noninvasive tests therefore his noninvasive tests are scheduled for July 1. We agree that he has venous insufficiency ulcers but I cannot feel any pulses in his lower extremities dictating the need for test. We are only using Kerlix and light Coban unfortunately this appears to be holding the edema 6/30; has his arterial studies tomorrow. We have been using Kerlix and light Coban will go to a more aggressive compression if the arterial studies will allow. We all agreed these are venous wounds however I cannot feel pulses at either the dorsalis pedis or posterior tibial bilaterally. His wounds generally look some better including left lateral and right posterior. 7/7-Patient returns at 1 week in Kerlix/Coban to both legs, with improvement, in the left lateral and right posterior lower leg wounds, ABI's are normal in both legs per vascular studies, TBI is also normal on both sides, we are using hydrofera blue to the wounds 7/14; patient's arterial studies from 2 weeks ago showed an ABI on the right at 1.03 with a TBI of 0.86. On the left the ABI was 1.06 with a TBI of 0.84. Notable for the fact that his arterial waveforms were monophasic in all of the lower extremity arteries suggesting some degree of arterial occlusive disease but in general this was felt to be fairly adequate for healing. His compression was increased from 2-3 layer which is appropriate. Dressing was changed to [facility] 7/21; patient's wounds are measuring smaller. The more substantial one on the right posterior calf, second 1 on the left lateral calf. Using [facility] on both wound areas 7/28; patient continues to make nice improvements. The area on the right posterior calf is smaller. Area on the left lateral calf also is smaller. We  have been using Hydrofera Blue under compression. The patient will need compression stockings and we have measured him for these in the eventuality that these heal which really should not be too long from now 8/4-Patient continues to make improvement, the right  posterior calf area smaller with rim of keratotic skin on one side, the left wound is definitely smaller and improving. 8/11-Returns at 1 week, after being in 3 layer compression on both legs, both wounds appear to be improving, making good progress, patient is happy, pain is also less especially in the right leg wound 8/18; the area on the left anterior lower leg is healed. On the right posterior leg the wound remains although the dimensions are a lot better. 8/25; he arrives in clinic today with a large body of open wound on the left lateral calf. All of the 3 wounds in this area are in close juxtaposition to each other. The story is that we discharged him last week with no a wrap on the left leg. They went to [place] could not get in as they are only excepting phone orders or online orders for stockings hence they did not put any stocking on the left leg all week. They have something at home but the patient with that was either incapable or just did not put them on. Apparently these opened 1 morning after getting out of bed. The area on the right has no real change 9/1; patient has bilateral lower extremity wounds in the setting of severe chronic venous insufficiency and secondary lymphedema. He arrived last week with new areas on the left lateral lower leg after we did not wrap him and he did not use his stockings. Nevertheless the areas on the left look better today under compression. Posterior right calf does not really changed. We are using Hydrofera Blue on both areas under compression 9/15; bilateral lower extremity wounds in the setting of severe chronic venous insufficiency and secondary lymphedema. He has 20 to 30 mmHg  below-knee compression stockings under the eventuality that these close over. We did get the left leg to close but he did not transition to a stocking and this reopened. There are 2 open areas on the left posterior lateral calf and one on the right. Both of these look satisfactory. Using [facility] 9/22; bilateral lower extremity wounds in the setting of chronic venous insufficiency. 2 superficial areas on the left lateral calf. One on the right just above the Achilles area. We have good edema control we have been using Hydrofera Blue 9/29; the areas on the left lateral calf are healed. On the right just above the Achilles and tendon area things look a lot better small wounds one scabbed area. We have been using Hydrofera Blue. We can discharge him in his own stocking on the left still wrapping on the right. This is the second time we have healed the left leg but he did not put a stocking on last time. Hopefully this will maintain the edema from chronic venous disease with secondary lymphedema 10/6; he comes in today having a stocking on the left leg. They had trouble getting it on there is a lot of increase in swelling 2 small open areas one anteriorly and one on the medial calf. They report a lot of difficulty getting the stocking on. ooParadoxically the area on the right that we have been wrapping posteriorly is closed 10/13; he comes in today with wounds bilaterally including superficial areas on the left medial and left lateral calf. As well as the right posterior has reopened in the Achilles area superiorly. He still does not have his juxta lite stockings although truthfully we would not of been able to use them today anyway. Apparently have been ordered and paid for  from [employer] although they have not been delivered 10/20; his area on the right is just the boat closed on the right posterior. Still has the area on the left lateral and a very tiny area on the left medial. He has his bilateral  juxta lites although he is not ready for them this week. He tolerated the increase to 4 layer compression last week quite well 10/27; the area on the right posterior calf is once again closed. He has a superficial area on the left lateral calf that is still open. He has been using Hydrofera Blue and bilateral 4-layer compression. He can change to his own juxta lite stocking on the right and we are instructing him today 11/3; the area on the right posterior calf reopened according to the patient and his wife after they took off the stocking when they got home last week. Apparently scabbed over there is now a fairly substantial wound which looks pretty much the same. Our intake nurse noted that they were using the juxta lite stockings appropriately. I was really hoping I might be able to close him out today. He has 1 very tiny remaining area on the left lateral lower leg. 11/10; right posterior calf wound measures smaller but is still open. We have been using Hydrofera Blue. On the left he has a small Daniel-shaped wound and he seems to have had another wound distally that is open and likely a blister. We are using Hydrofera Blue under compression 11/17; right posterior calf wound continues to get better. We have been using Hydrofera Blue. On the left lateral one of the wounds has closed still a small open area. We have been using Hydrofera Blue on this as well. Both areas have been under 4-layer compression Arrives in clinic today with some swelling in the dorsal foot on the right some erythema of his forefoot and toes. Initially when I looked at this I almost thought this was a sunburn distal to a wrap injury. 12/1; right posterior calf wound debrided with a curette. We have been using Hydrofera Blue on the left anterior lateral he has an area across the mid tibia. Finally a small area on the left lateral lower calf. Finally he continues to have de-epithelialized areas on the dorsal aspect of his toes.  Initially thought this might be a burn injury when I saw him 2 weeks ago. I now wonder about tinea. I have also reviewed his arterial studies which were really quite good in July/20 with normal TBI's and ABIs but monophasic waveforms 12/8; comes in today with worsening problems especially on the left leg where he now has a cluster of wounds in the left anterior mid tibia. Very poor edema control. I reduced him to 3 layer from 4 layer compression last week because of some concern about blood flow to his toes however he does not have good edema control on the left leg. Right leg edema control looks satisfactory. ooOn the left he has a cluster of wounds anteriorly, small area on the left medial fifth met head and then the collection of areas on his toes which appear better ooOn the right he has the original area on the right posterior calf, a new area right medially. His formal arterial studies from mid July are noted below. He was evaluated by Dr.Berry ABI Findings: +---------+------------------+-----+----------+--------+ Right Rt Pressure (mmHg)IndexWaveform Comment  +---------+------------------+-----+----------+--------+ Brachial 176     +---------+------------------+-----+----------+--------+ ATA 176 0.99 monophasic  +---------+------------------+-----+----------+--------+ PTA 183 1.03 monophasic  +---------+------------------+-----+----------+--------+ PERO 172  0.97 monophasic  +---------+------------------+-----+----------+--------+ Great T oe153 0.86    +---------+------------------+-----+----------+--------+ +---------+------------------+-----+-----------+-------+ Left Lt Pressure (mmHg)IndexWaveform Comment +---------+------------------+-----+-----------+-------+ Brachial 178     +---------+------------------+-----+-----------+-------+ ATA 162 0.91 multiphasic  +---------+------------------+-----+-----------+-------+ PTA  188 1.06 multiphasic  +---------+------------------+-----+-----------+-------+ PERO 158 0.89 monophasic   +---------+------------------+-----+-----------+-------+ Great T oe150 0.84    +---------+------------------+-----+-----------+-------+ +-------+-----------+-----------+------------+------------+ ABI/TBIT oday's ABIT oday's TBIPrevious ABIPrevious TBI +-------+-----------+-----------+------------+------------+ Right 1.03 0.86 1.11   +-------+-----------+-----------+------------+------------+ Left 1.06 0.84 1.27   +-------+-----------+-----------+------------+------------+ Tibial waveforms somewhat difficult to record due to irregular heartbeat. Bilateral ABIs appear essentially unchanged compared to prior study on 06/21/15. Summary: Right: Resting right ankle-brachial index is within normal range. No evidence of significant right lower extremity arterial disease. The right toe-brachial index is normal. Although ankle brachial indices are within normal limits (0.95-1.29), arterial Doppler waveforms at the ankle suggest some component of arterial occlusive disease. Left: Resting left ankle-brachial index is within normal range. No evidence of significant left lower extremity arterial disease. The left toe-brachial index is normal. Although ankle brachial indices are within normal limits (0.95-1.29), arterial Doppler waveforms at the ankle suggest some component of arterial occlusive disease. 12/15; the patient's area on the left mid tibia looks better. Right posterior calf also better. He has the area on the left foot as well. All of his toes look better I think this was tinea. We are using Hydrofera Blue everywhere else The patient was in urgent care yesterday with wheezing and shortness of breath. He got azithromycin and prednisone. He feels better. His lungs are currently clear to auscultation. He was not tested for Covid 19 12/29; the patient  arrives in clinic today with quite a bit change. 2 weeks ago he only had areas on the left mid tibia right posterior calf with tinea pedis resolving between his toes. He arrives in clinic today with several areas on the dorsal toes on the right, dorsal left second toe. He has skin breakdown in the left medial calf probably from excess edema. Small area proximally in the medial calf. He has weeping edema fluid coming out of the skin excoriations on the left medial calf. He tells me that he is having a cardiac catheterization next week. I had a quick look at [facility] health link. He was found to have an ejection fraction of 25% during the work-up for persistent atrial fibrillation. He saw his cardiology office yesterday seen by the nurse practitioner. She increased his carvedilol. He has not been on diuretics for apparently several months and indeed in the nurse practitioner Dietrich Pates notes she had knowledge of this. 10/04/2019 on evaluation today patient presents as a walk-in visit concerning the fact that he did not have an appointment here for our clinic at this point. He actually had a cardiac catheterization earlier today and then came from there to here in order to be evaluated. With that being said unfortunately he is having significant cellulitis of his left lower extremity upon evaluation today this appears to have deteriorated even since last week's evaluation with Dr. Dellia Nims. The right lower extremity is actually doing okay I really see no evidence of deterioration at this point at those locations. In fact the right leg seems in general be doing quite well. Nonetheless I am concerned about infection and cellulitis of the left lower extremity and again considering his weakened heart I do not want him to develop into sepsis at all. He is also having some trouble breathing today and I understand according to nursing staff this is always the case to some  degree. With that being said the patient  unfortunately seems to be in my opinion a little bit worse even his wife feels like that may be the case today. Unsure exactly what is leading to this. Cardiac catheterization I did review the report which showed an ejection fraction of 25% he also had an LAD blockage of around 25% based on what I saw. With that being said there was no significant blockages that required stenting at this point he does have weakened cardiac muscles compared to normal. 10/05/2019; patient was seen yesterday in clinic. He was sent to the ER because of cellulitis of the left leg possibility. In the ER he was given 1 dose of IV Levaquin and discharged on Keflex. He came in the clinic initially for a nurse visit to rewrap his left leg. We did not look at the right leg today that is an Haematologist. The patient also had a cardiac cath. According to him there were no blockages but a very low ejection fraction. 1/12; back for an early follow-up. The condition of the left lower leg is a lot better although there are multiple open areas. All of them with not a particularly viable surface. On the right posterior calf he has a single wound with a clean surface. He has a wound with exposed bone on the PIP of the left second toe dorsally. He has wounds on the dorsal right first second and third toes. His arterial studies were normal. 1/19; the patient has 3 open wounds on the left leg anteriorly in the mid tibia, distally and medially just above the ankle and posteriorly. On the right he has a small area dime sized on the right posterior calf. His edema control is a lot better. He still has wounds on the right second and left second toes. The left second toe has exposed bone. X-ray I did last week did not show evidence of osteomyelitis in the left foot. 1/26; patient with a multiplicity of wounds and problems. ooOn the left he has a circular area on the left anterior mid tibia ooLeft just above the medical ankle -left 2nd toe  pip -right 2nd toe right posterior calf 2/2; patient with a multiplicity of wounds and problems. He has severe chronic venous insufficiency and the wounds on his leg are all on the left left anterior left medial at the medial malleolus and left posterior. We have been using Iodoflex to these areas to help with ongoing debridement. He also has largely traumatic wounds on his toes this includes the left second with exposed bone. Bone culture I did last week showed Staph aureus which is methicillin sensitive. I discussed with him today the idea of an amputation of this toe because it is literally nonfunctional however he wants to try antibiotics. Antibiotic choice is complicated by the fact that he is on Coumadin. He also has wounds over the dorsal part of the right first which is close to closed. Right second toe has exposed bone and the right third toe at the base of the right third toe is just about closed as well. We have been using silver alginate 2/9; patient with severe chronic venous insufficiency. He has large wounds on the left anterior mid tibia and on the left medial just above the ankle. I am concerned today about the depth of the area on the left mid tibia may be exposing into the muscle. He has small areas on the left posterior calf and on the right posterior calf  although these do not look as threatening. He also has traumatic wounds on his toes. The right first and third are closed however the bilateral second toes have exposed bone. The area on the left is open into the distal interphalangeal joint. In my opinion these toes are nonsalvageable and will need amputation 2/16; patient with severe chronic venous insufficiency. He has wounds on the left anterior mid tibia left medial lower leg just above the ankle. He has small areas on the left posterior calf and a more prominent area on the right posterior calf. All of these are related to poorly controlled venous hypertension. He  also had traumatic wounds on the PIPs of both second toes. Unfortunately both of these have exposed bone and in the case of the left this I think goes right into the joint itself. I do not think either 1 of these is salvageable. I have reviewed his arterial evaluation that was done in July last year. He also saw Dr. [name] in June. He felt these were venous stasis. Previously had segmental arterial pressures performed in 11/03/2014 which were normal. He was sent for for a follow-up arterial evaluation on July 1. On the right his ABIs were 1.03 with a TBI of 0.86 although his waveforms were monophasic on the left he had multiphasic waveforms at the ATA PTA but monophasic at the peroneal nevertheless his ABI was normal at 1.06 TBI also normal at 0.84. I think he has enough blood flow to heal an amputation which I think he needs of the left second toe and probably the right second toe as well 2/23; the patient is going for his bilateral second toe amputation by Dr. Amalia Hailey on Friday. In the meantime is 3 areas on the left leg and the small area on the right posterior look better. We have been using silver collagen. 3/2; the patient's toe amputations were canceled because of cardiac concerns. Apparently this surgery will need to be done in the hospital although they do not have an appointment. In the meantime he is continues to have 3 areas on the left leg one anteriorly and 2 smaller areas posteriorly on the left as well as the small posterior area on the right. We have been using silver collagen with compression 3/9; still no word on the second toe amputations bilaterally. 3 wounds on the left leg all look better and the area on the right posterior we have been using silver collagen I change this to [facility] 3/16; amputations next Monday both second toes he is apparently going to be hospitalized. He has on his left leg including left anterior and left lateral look better very superficial also the  right posterior. 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 He continues to have left leg wounds anteriorly x2 posteriorly x1 at the left lower leg just above the ankle anteriorly x1 and posteriorly on the right x1 3/30; his anterior left leg wounds are considerably smaller. In fact the only wound that is not smaller is on the right. We have been using Hydrofera Blue under Kerlix Cobax 4/6; his anterior left leg wounds continue to contract in fact the area distally and medially has closed over. He still has the area anteriorly on the left. Both posterior calf wounds on the left and right are much smaller. We saw his surgical wound at the left second toe amputation site that  still sutured it looks healthy, this is being followed by his podiatrist. We went over what compression he has he has bilateral juxta lites I am hopeful the issue here was that we just did not have them tight enough because he broke down very quickly the last time we healed him out. 4/13; the patient has 3 remaining wounds one small area on the left anterior lower leg one on the left posterior and one on the right anterior. We have been using Hydrofera Blue under compression compression all of these are contracting. We did discuss life in the eventuality he will totally closed which seems likely in the next week or 2. He has bilateral juxta lite stockings. These will need to be set at 30-40 compression. Since we last closed him over for his bilateral lower extremity wounds he is also been treated for heart failure which may help. He undoubtedly has severe chronic venous hypertension however as well 4/19; the area on the left anterior tibial area is closed. The area on the right posterior calf is just above closed. The area on the left posterior calf has some depth. We have been using Hydrofera Blue under 3 layer compression I  change that to silver collagen today The patient tells me he had his stitches out on his amputated second toes bilaterally by podiatry yesterday. Everything looks quite good here. He does not need a specific dressing 4/27; the right posterior leg wound is closed there is nothing open on the right. Still has a small punched out area on the left posterior calf. He will be transitioned into the juxta lite stocking on the right today. Still wrapping the left leg 5/4; right leg is still closed although we did not take the juxta lite stocking off. Will do this next week. Small punched out area on the left posterior calf is still not healed we have been using silver collagen under compression 5/11; right leg is still closed although he has dry flaking skin on the dorsal and plantar part of his foot. No real evidence of infection. ooThe one remaining wound on the left posterior calf. This is actually measuring somewhat larger we have been using Hydrofera Blue Objective Constitutional Sitting or standing Blood Pressure is within target range for patient.. Pulse regular and within target range for patient.Marland Kitchen Respirations regular, non-labored and within target range.. Temperature is normal and within the target range for the patient.Marland Kitchen Appears in no distress. Vitals Time Taken: 8:15 AM, Height: 74 in, Source: Stated, Weight: 212 lbs, Source: Stated, BMI: 27.2, Temperature: 97.8 F, Pulse: 71 bpm, Respiratory Rate: 18 breaths/min, Blood Pressure: 123/63 mmHg. General Notes: Wound exam; left posterior calf. Under illumination some debris on the wound surface I used a #3 curette curette to remove this as well as some very fibrinous surface debris. Hemostasis with direct pressure. Wound surface cleans up quite well Integumentary (Hair, Skin) Wound #25 status is Open. Original cause of wound was Gradually Appeared. The wound is located on the Left,Distal,Posterior Lower Leg. The wound measures 1.1cm length x  1.3cm width x 0.1cm depth; 1.123cm^2 area and 0.112cm^3 volume. There is Fat Layer (Subcutaneous Tissue) Exposed exposed. There is no tunneling or undermining noted. There is a small amount of serosanguineous drainage noted. The wound margin is flat and intact. There is large (67- 100%) red granulation within the wound bed. There is a small (1-33%) amount of necrotic tissue within the wound bed including Adherent Slough. Assessment Active Problems ICD-10 Non-pressure chronic ulcer of left  calf limited to breakdown of skin Chronic venous hypertension (idiopathic) with ulcer and inflammation of bilateral lower extremity Type 2 diabetes mellitus with diabetic peripheral angiopathy without gangrene Type 2 diabetes mellitus with diabetic polyneuropathy Procedures Wound #25 Pre-procedure diagnosis of Wound #25 is a Venous Leg Ulcer located on the Left,Distal,Posterior Lower Leg .Severity of Tissue Pre Debridement is: Fat layer exposed. There was a Excisional Skin/Subcutaneous Tissue Debridement with a total area of 1.43 sq cm performed by Ricard Dillon., MD. With the following instrument(s): Curette to remove Viable and Non-Viable tissue/material. Material removed includes Subcutaneous Tissue, Slough, Skin: Dermis, and Skin: Epidermis after achieving pain control using Lidocaine 5% topical ointment. No specimens were taken. A time out was conducted at 08:29, prior to the start of the procedure. A Moderate amount of bleeding was controlled with Pressure. The procedure was tolerated well with a pain level of 2 throughout and a pain level of 0 following the procedure. Post Debridement Measurements: 1.1cm length x 1.3cm width x 0.1cm depth; 0.112cm^3 volume. Character of Wound/Ulcer Post Debridement is improved. Severity of Tissue Post Debridement is: Fat layer exposed. Post procedure Diagnosis Wound #25: Same as Pre-Procedure Pre-procedure diagnosis of Wound #25 is a Venous Leg Ulcer located on the  Left,Distal,Posterior Lower Leg . There was a Three Layer Compression Therapy Procedure by [name], RN. Post procedure Diagnosis Wound #25: Same as Pre-Procedure Plan Follow-up Appointments: Return Appointment in 1 week. - Tuesday Dressing Change Frequency: Wound #25 Left,Distal,Posterior Lower Leg: Do not change entire dressing for one week. Skin Barriers/Peri-Wound Care: TCA Cream or Ointment Wound Cleansing: May shower with protection. Primary Wound Dressing: Wound #25 Left,Distal,Posterior Lower Leg: Polymem Silver Secondary Dressing: Dry Gauze - secure toes with tape Edema Control: Wound #25 Left,Distal,Posterior Lower Leg: Kerlix and Coban - Left Lower Extremity Support Garment 20-30 mm/Hg pressure to: - right lower leg, apply lotion daily with special attention to ankle area Off-Loading: Open toe surgical shoe to: - left and right foot 1. I change the primary dressing to PolyMem silver still under Kerlix and Coban 2. On his right foot I did not think there was any evidence of tinea although I will try to remember to see this next time looks like dry skin Electronic Signature(s) Signed: 02/06/2020 6:13:26 PM By: [name] MD Entered By: [name] on 02/06/2020 08:41:54 -------------------------------------------------------------------------------- SuperBill Details Patient Name: Date of Service: Daniel Phenix D. 02/06/2020 Medical Record Number: [id number] Patient Account Number: [id number] Date of Birth/Sex: Treating RN: 08/07/1941 (78 y.o. [name]) [name] Primary Care Provider: Leanord Asal, SA RA H Other Clinician: Referring Provider: Treating Provider/Extender: Candie Mile, SA RA H Weeks in Treatment: 49 Diagnosis Coding ICD-10 Codes Code Description [phone] Non-pressure chronic ulcer of left calf limited to breakdown of skin I87.333 Chronic venous hypertension (idiopathic) with ulcer and inflammation of bilateral lower extremity E11.51 Type  2 diabetes mellitus with diabetic peripheral angiopathy without gangrene E11.42 Type 2 diabetes mellitus with diabetic polyneuropathy Facility Procedures The patient participates with Medicare or their insurance follows the Medicare Facility Guidelines: CPT4 Code Description Modifier Quantity [id number] 11042 - DEB SUBQ TISSUE 20 SQ CM/< 1 ICD-10 Diagnosis Description L97.221 Non-pressure chronic ulcer of  left calf limited to breakdown of skin Physician Procedures : CPT4 Code Description Modifier [id number] - WC PHYS SUBQ TISS 20 SQ CM ICD-10 Diagnosis Description L97.221 Non-pressure chronic ulcer of left calf limited to breakdown of skin Quantity: 1 Electronic Signature(s) Signed: 02/06/2020 6:13:26 PM By: [name] MD  Entered By: [name] on 02/06/2020 08:42:09

## 2020-02-07 NOTE — Progress Notes (Signed)
NOEL RODIER ([id number]) , Visit Report for 01/23/2020 Arrival Information Details Patient Name: Date of Service: [name], [name] 01/23/2020 8:00 A M Medical Record Number: [id number] Patient Account Number: [id number] Date of Birth/Sex: Treating RN: July 19, 1941 (78 y.o. [name]) [name] Primary Care Amaurie Schreckengost: Leanord Asal, SA RA H Other Clinician: Referring Dayanira Giovannetti: Treating Crispin Vogel/Extender: Candie Mile, SA RA H Weeks in Treatment: 22 Visit Information History Since Last Visit Added or deleted any medications: No Patient Arrived: Wheel Chair Any new allergies or adverse reactions: No Arrival Time: 07:57 Had a fall or experienced change in No Accompanied By: wife activities of daily living that may affect Transfer Assistance: None risk of falls: Patient Identification Verified: Yes Signs or symptoms of abuse/neglect since last visito No Secondary Verification Process Completed: Yes Hospitalized since last visit: No Patient Requires Transmission-Based Precautions: No Implantable device outside of the clinic excluding No Patient Has Alerts: Yes cellular tissue based products placed in the center Patient Alerts: Patient on Blood Thinner since last visit: Has Dressing in Place as Prescribed: Yes Pain Present Now: No Electronic Signature(s) Signed: 02/07/2020 9:09:16 AM By: Sandre Kitty Entered By: Sandre Kitty on 01/23/2020 07:57:37 -------------------------------------------------------------------------------- Encounter Discharge Information Details Patient Name: Date of Service: Daniel Phenix D. 01/23/2020 8:00 A M Medical Record Number: [id number] Patient Account Number: [id number] Date of Birth/Sex: Treating RN: 1941/08/02 (78 y.o. [name]) [name] Primary Care Catalaya Garr: Leanord Asal, SA RA H Other Clinician: Referring Skyla Champagne: Treating Haylee Mcanany/Extender: Candie Mile, SA RA H Weeks in Treatment: 7 Encounter Discharge Information Items Post  Procedure Vitals Discharge Condition: Stable Temperature (F): 97.8 Ambulatory Status: Wheelchair Pulse (bpm): 83 Discharge Destination: Home Respiratory Rate (breaths/min): 18 Transportation: Private Auto Blood Pressure (mmHg): 122/67 Accompanied By: wife Schedule Follow-up Appointment: Yes Clinical Summary of Care: Electronic Signature(s) Signed: 01/23/2020 5:27:53 PM By: Deon Pilling Entered By: Deon Pilling on 01/23/2020 08:46:11 -------------------------------------------------------------------------------- Lower Extremity Assessment Details Patient Name: Date of Service: [name], [name] 01/23/2020 8:00 A M Medical Record Number: [id number] Patient Account Number: [id number] Date of Birth/Sex: Treating RN: 1941/07/16 (78 y.o. [name]) [name] Primary Care Bay Wayson: Leanord Asal, SA RA H Other Clinician: Referring Snigdha Howser: Treating Donley Harland/Extender: Candie Mile, SA RA H Weeks in Treatment: 47 Edema Assessment Assessed: [Left: Yes] [Right: Yes] Edema: [Left: No] [Right: No] Calf Left: Right: Point of Measurement: 31 cm From Medial Instep 30.5 cm 29.5 cm Ankle Left: Right: Point of Measurement: 11 cm From Medial Instep 19.5 cm 20 cm Vascular Assessment Pulses: Dorsalis Pedis Palpable: [Left:Yes] [Right:Yes] Electronic Signature(s) Signed: 01/23/2020 5:27:53 PM By: Deon Pilling Signed: 01/23/2020 5:28:23 PM By: [name] RN Entered By: Deon Pilling on 01/23/2020 08:06:12 -------------------------------------------------------------------------------- Multi Wound Chart Details Patient Name: Date of Service: Daniel Phenix D. 01/23/2020 8:00 A M Medical Record Number: [id number] Patient Account Number: [id number] Date of Birth/Sex: Treating RN: 15-Jan-1941 (78 y.o. [name]) [name] Primary Care Dyan Creelman: Leanord Asal, SA RA H Other Clinician: Referring Latrell Potempa: Treating Greyden Besecker/Extender: Candie Mile, SA RA H Weeks in Treatment: 77 Vital  Signs Height(in): 74 Pulse(bpm): 58 Weight(lbs): 212 Blood Pressure(mmHg): 122/67 Body Mass Index(BMI): 27 Temperature(F): 97.8 Respiratory Rate(breaths/min): 18 Photos: [25:No Photos Left, Distal, Posterior Lower Leg] [3R:No Photos Right, Posterior Calf] [N/A:N/A N/A] Wound Location: [25:Gradually Appeared] [3R:Gradually Appeared] [N/A:N/A] Wounding Event: [25:Venous Leg Ulcer] [3R:Diabetic Wound/Ulcer of the Lower] [N/A:N/A] Primary Etiology: [25:Diabetic Wound/Ulcer of the Lower] [3R:Extremity N/A] [N/A:N/A] Secondary Etiology: [25:Extremity Cataracts, Hypertension, Peripheral] [3R:N/A] [N/A:N/A]  Comorbid History: [25:Venous Disease, Type II Diabetes, Gout, Received Radiation 10/04/2019] [3R:02/28/2019] [N/A:N/A] Date Acquired: [25:15] [3R:47] [N/A:N/A] Weeks of Treatment: [25:Open] [3R:Healed - Epithelialized] [N/A:N/A] Wound Status: [25:No] [3R:Yes] [N/A:N/A] Wound Recurrence: [25:Yes] [3R:No] [N/A:N/A] Clustered Wound: [25:1] [3R:N/A] [N/A:N/A] Clustered Quantity: [25:0.8x0.4x0.2] [3R:0x0x0] [N/A:N/A] Measurements L x W x D (cm) [25:0.251] [3R:0] [N/A:N/A] A (cm) : rea [25:0.05] [3R:0] [N/A:N/A] Volume (cm) : [25:99.00%] [3R:100.00%] [N/A:N/A] % Reduction in A [25:rea: 98.00%] [3R:100.00%] [N/A:N/A] % Reduction in Volume: [25:Full Thickness Without Exposed] [3R:Grade 2] [N/A:N/A] Classification: [25:Support Structures Small] [3R:N/A] [N/A:N/A] Exudate A mount: [25:Serosanguineous] [3R:N/A] [N/A:N/A] Exudate Type: [25:red, brown] [3R:N/A] [N/A:N/A] Exudate Color: [25:Flat and Intact] [3R:N/A] [N/A:N/A] Wound Margin: [25:None Present (0%)] [3R:N/A] [N/A:N/A] Granulation A mount: [25:None Present (0%)] [3R:N/A] [N/A:N/A] Necrotic A mount: [25:Fat Layer (Subcutaneous Tissue)] [3R:N/A] [N/A:N/A] Exposed Structures: [25:Exposed: Yes Fascia: No Tendon: No Muscle: No Joint: No Bone: No Large (67-100%)] [3R:N/A] [N/A:N/A] Epithelialization: [25:Debridement - Excisional] [3R:N/A]  [N/A:N/A] Debridement: Pre-procedure Verification/Time Out 08:27 [3R:N/A] [N/A:N/A] Taken: [25:Lidocaine 5% topical ointment] [3R:N/A] [N/A:N/A] Pain Control: [25:Subcutaneous, Slough] [3R:N/A] [N/A:N/A] Tissue Debrided: [25:Skin/Subcutaneous Tissue] [3R:N/A] [N/A:N/A] Level: [25:0.32] [3R:N/A] [N/A:N/A] Debridement A (sq cm): [25:rea Curette] [3R:N/A] [N/A:N/A] Instrument: [25:Moderate] [3R:N/A] [N/A:N/A] Bleeding: [25:Pressure] [3R:N/A] [N/A:N/A] Hemostasis A chieved: [25:0] [3R:N/A] [N/A:N/A] Procedural Pain: [25:0] [3R:N/A] [N/A:N/A] Post Procedural Pain: [25:Procedure was tolerated well] [3R:N/A] [N/A:N/A] Debridement Treatment Response: [25:0.8x0.4x0.2] [3R:N/A] [N/A:N/A] Post Debridement Measurements L x W x D (cm) [25:0.05] [3R:N/A] [N/A:N/A] Post Debridement Volume: (cm) [25:fibrin noted over wound bed.] [3R:N/A] [N/A:N/A] Assessment Notes: [25:Debridement] [3R:N/A] [N/A:N/A] Treatment Notes Wound #25 (Left, Distal, Posterior Lower Leg) 1. Cleanse With Wound Cleanser Soap and water 2. Periwound Care TCA Cream 3. Primary Dressing Applied Collegen AG 4. Secondary Dressing Dry Gauze 6. Support Layer Applied Kerlix/Coban Notes netting. explained how to apply juxtalite to right leg patient and wife. Both in agreement. Electronic Signature(s) Signed: 01/23/2020 5:28:23 PM By: [name] RN Signed: 01/23/2020 5:41:23 PM By: [name] MD Entered By: [name] on 01/23/2020 08:55:39 -------------------------------------------------------------------------------- Multi-Disciplinary Care Plan Details Patient Name: Date of Service: Daniel Phenix D. 01/23/2020 8:00 A M Medical Record Number: [id number] Patient Account Number: [id number] Date of Birth/Sex: Treating RN: 1941/09/26 (78 y.o. [name]) [name] Primary Care Quanna Wittke: Leanord Asal, SA RA H Other Clinician: Referring Rosezella Kronick: Treating Maxx Calaway/Extender: Candie Mile, SA RA H Weeks in Treatment:  2 Active Inactive Wound/Skin Impairment Nursing Diagnoses: Knowledge deficit related to ulceration/compromised skin integrity Goals: Patient/caregiver will verbalize understanding of skin care regimen Date Initiated: 02/28/2019 Target Resolution Date: 01/30/2020 Goal Status: Active Ulcer/skin breakdown will have a volume reduction of 30% by week 4 Date Initiated: 02/28/2019 Date Inactivated: 04/04/2019 Target Resolution Date: 03/31/2019 Goal Status: Met Ulcer/skin breakdown will have a volume reduction of 50% by week 8 Date Initiated: 04/04/2019 Date Inactivated: 05/09/2019 Target Resolution Date: 05/05/2019 Goal Status: Met Ulcer/skin breakdown will have a volume reduction of 80% by week 12 Date Initiated: 05/09/2019 Date Inactivated: 06/13/2019 Target Resolution Date: 06/09/2019 Unmet Reason: comorbities/new Goal Status: Unmet wounds Ulcer/skin breakdown will heal within 14 weeks Date Initiated: 06/13/2019 Date Inactivated: 07/11/2019 Target Resolution Date: 07/07/2019 Goal Status: Unmet Unmet Reason: comorbityies Interventions: Assess patient/caregiver ability to obtain necessary supplies Assess patient/caregiver ability to perform ulcer/skin care regimen upon admission and as needed Assess ulceration(s) every visit Notes: Electronic Signature(s) Signed: 01/23/2020 5:28:23 PM By: [name] RN Entered By: [name] on 01/23/2020 07:58:38 -------------------------------------------------------------------------------- Pain Assessment Details Patient Name: Date of Service: Daniel Phenix D. 01/23/2020 8:00 A M  Medical Record Number: [id number] Patient Account Number: [id number] Date of Birth/Sex: Treating RN: June 23, 1941 (78 y.o. [name]) [name] Primary Care Ceana Fiala: Leanord Asal, SA RA H Other Clinician: Referring Triton Heidrich: Treating Emile Ringgenberg/Extender: Candie Mile, SA RA H Weeks in Treatment: 65 Active Problems Location of Pain Severity and Description of Pain Patient  Has Paino No Site Locations Pain Management and Medication Current Pain Management: Electronic Signature(s) Signed: 01/23/2020 5:28:23 PM By: [name] RN Signed: 02/07/2020 9:09:16 AM By: Sandre Kitty Entered By: Sandre Kitty on 01/23/2020 08:00:44 -------------------------------------------------------------------------------- Patient/Caregiver Education Details Patient Name: Date of Service: [name] 4/27/2021andnbsp8:00 A M Medical Record Number: [id number] Patient Account Number: [id number] Date of Birth/Gender: Treating RN: [birth year] (78 y.o. [name]) [name] Primary Care Physician: Leanord Asal, SA RA H Other Clinician: Referring Physician: Treating Physician/Extender: Candie Mile, SA RA H Weeks in Treatment: 23 Education Assessment Education Provided To: Patient Education Topics Provided Wound/Skin Impairment: Methods: Explain/Verbal Responses: State content correctly Electronic Signature(s) Signed: 01/23/2020 5:28:23 PM By: [name] RN Entered By: [name] on 01/23/2020 07:59:37 -------------------------------------------------------------------------------- Wound Assessment Details Patient Name: Date of Service: [name], Daniel D. 01/23/2020 8:00 A M Medical Record Number: [id number] Patient Account Number: [id number] Date of Birth/Sex: Treating RN: Jan 11, 1941 (78 y.o. [name]) [name] Primary Care Nayquan Evinger: Leanord Asal, SA RA H Other Clinician: Referring Budd Freiermuth: Treating Daniel Cleckley/Extender: Candie Mile, SA RA H Weeks in Treatment: 47 Wound Status Wound Number: 25 Primary Venous Leg Ulcer Etiology: Wound Location: Left, Distal, Posterior Lower Leg Secondary Diabetic Wound/Ulcer of the Lower Extremity Wounding Event: Gradually Appeared Etiology: Date Acquired: 10/04/2019 Wound Open Weeks Of Treatment: 15 Status: Clustered Wound: Yes Comorbid Cataracts, Hypertension, Peripheral Venous Disease, Type II History: Diabetes,  Gout, Received Radiation Photos Photo Uploaded By: Mikeal Hawthorne on 01/24/2020 11:27:52 Wound Measurements Length: (cm) 0 Width: (cm) 0 Depth: (cm) 0 Clustered Quantity: 1 Area: (cm) Volume: (cm) .8 % Reduction in Area: 99% .4 % Reduction in Volume: 98% .2 Epithelialization: Large (67-100%) Tunneling: No 0.251 Undermining: No 0.05 Wound Description Classification: Full Thickness Without Exposed Support Structures Wound Margin: Flat and Intact Exudate Amount: Small Exudate Type: Serosanguineous Exudate Color: red, brown Foul Odor After Cleansing: No Slough/Fibrino Yes Wound Bed Granulation Amount: None Present (0%) Exposed Structure Necrotic Amount: None Present (0%) Fascia Exposed: No Fat Layer (Subcutaneous Tissue) Exposed: Yes Tendon Exposed: No Muscle Exposed: No Joint Exposed: No Bone Exposed: No Assessment Notes fibrin noted over wound bed. Electronic Signature(s) Signed: 01/23/2020 5:27:53 PM By: Deon Pilling Signed: 01/23/2020 5:28:23 PM By: [name] RN Entered By: Deon Pilling on 01/23/2020 08:07:26 -------------------------------------------------------------------------------- Wound Assessment Details Patient Name: Date of Service: Daniel Phenix D. 01/23/2020 8:00 A M Medical Record Number: [id number] Patient Account Number: [id number] Date of Birth/Sex: Treating RN: 06/28/1941 (78 y.o. [name]) [name] Primary Care Governor Matos: Leanord Asal, SA RA H Other Clinician: Referring Lucendia Leard: Treating Graiden Henes/Extender: Candie Mile, SA RA H Weeks in Treatment: 47 Wound Status Wound Number: [id number] Primary Etiology: Diabetic Wound/Ulcer of the Lower Extremity Wound Location: Right, Posterior Calf Wound Status: Healed - Epithelialized Wounding Event: Gradually Appeared Date Acquired: 02/28/2019 Weeks Of Treatment: 47 Clustered Wound: No Photos Photo Uploaded By: Mikeal Hawthorne on 01/24/2020 11:27:52 Wound Measurements Length: (cm) Width:  (cm) Depth: (cm) Area: (cm) Volume: (cm) 0 % Reduction in Area: 100% 0 % Reduction in Volume: 100% 0 0 0 Wound Description Classification: Grade 2 Electronic Signature(s) Signed: 01/23/2020 5:27:53 PM By: Rolin Barry,  Tammi Klippel Signed: 01/23/2020 5:28:23 PM By: [name] RN Entered By: Deon Pilling on 01/23/2020 08:07:49 -------------------------------------------------------------------------------- Vitals Details Patient Name: Date of Service: Daniel Phenix D. 01/23/2020 8:00 A M Medical Record Number: [id number] Patient Account Number: [id number] Date of Birth/Sex: Treating RN: 1941/09/12 (78 y.o. [name]) [name] Primary Care Shilo Pauwels: Leanord Asal, SA RA H Other Clinician: Referring Nikky Duba: Treating Lacole Komorowski/Extender: Candie Mile, SA RA H Weeks in Treatment: 60 Vital Signs Time Taken: 08:00 Temperature (F): 97.8 Height (in): 74 Pulse (bpm): 83 Weight (lbs): 212 Respiratory Rate (breaths/min): 18 Body Mass Index (BMI): 27.2 Blood Pressure (mmHg): 122/67 Reference Range: 80 - 120 mg / dl Electronic Signature(s) Signed: 02/07/2020 9:09:16 AM By: Sandre Kitty Entered By: Sandre Kitty on 01/23/2020 08:00:39

## 2020-02-13 ENCOUNTER — Encounter (HOSPITAL_BASED_OUTPATIENT_CLINIC_OR_DEPARTMENT_OTHER): Payer: Medicare HMO | Admitting: Internal Medicine

## 2020-02-13 DIAGNOSIS — E1142 Type 2 diabetes mellitus with diabetic polyneuropathy: Secondary | ICD-10-CM | POA: Diagnosis not present

## 2020-02-13 DIAGNOSIS — M109 Gout, unspecified: Secondary | ICD-10-CM | POA: Diagnosis not present

## 2020-02-13 DIAGNOSIS — I87333 Chronic venous hypertension (idiopathic) with ulcer and inflammation of bilateral lower extremity: Secondary | ICD-10-CM | POA: Diagnosis not present

## 2020-02-13 DIAGNOSIS — E1151 Type 2 diabetes mellitus with diabetic peripheral angiopathy without gangrene: Secondary | ICD-10-CM | POA: Diagnosis not present

## 2020-02-13 DIAGNOSIS — L97221 Non-pressure chronic ulcer of left calf limited to breakdown of skin: Secondary | ICD-10-CM | POA: Diagnosis not present

## 2020-02-13 DIAGNOSIS — I10 Essential (primary) hypertension: Secondary | ICD-10-CM | POA: Diagnosis not present

## 2020-02-13 DIAGNOSIS — Z7901 Long term (current) use of anticoagulants: Secondary | ICD-10-CM | POA: Diagnosis not present

## 2020-02-13 DIAGNOSIS — I482 Chronic atrial fibrillation, unspecified: Secondary | ICD-10-CM | POA: Diagnosis not present

## 2020-02-13 DIAGNOSIS — I429 Cardiomyopathy, unspecified: Secondary | ICD-10-CM | POA: Diagnosis not present

## 2020-02-13 NOTE — Progress Notes (Signed)
[name] ([id number]) , Visit Report for 02/13/2020 Arrival Information Details Patient Name: Date of Service: [name] 02/13/2020 8:00 A M Medical Record Number: [id number] Patient Account Number: [id number] Date of Birth/Sex: Treating RN: 1941/04/07 (79 y.o. [name]) [name] Primary Care Karsyn Jamie: Leanord Asal, SA RA H Other Clinician: Referring Dulcy Sida: Treating Ahniyah Giancola/Extender: Candie Mile, SA RA H Weeks in Treatment: 60 Visit Information History Since Last Visit Added or deleted any medications: No Patient Arrived: Wheel Chair Any new allergies or adverse reactions: No Arrival Time: 08:09 Had a fall or experienced change in No Accompanied By: wife activities of daily living that may affect Transfer Assistance: None risk of falls: Patient Identification Verified: Yes Signs or symptoms of abuse/neglect since No Secondary Verification Process Completed: Yes last visito Patient Requires Transmission-Based Precautions: No Hospitalized since last visit: No Patient Has Alerts: Yes Implantable device outside of the clinic No Patient Alerts: Patient on Blood Thinner excluding cellular tissue based products placed in the center since last visit: Has Dressing in Place as Prescribed: Yes Has Compression in Place as Prescribed: Yes Has Footwear/Offloading in Place as Yes Prescribed: Left: Surgical Shoe with Pressure Relief Insole Pain Present Now: No Electronic Signature(s) Signed: 02/13/2020 3:47:52 PM By: Deon Pilling Entered By: Deon Pilling on 02/13/2020 08:27:33 -------------------------------------------------------------------------------- Clinic Level of Care Assessment Details Patient Name: Date of Service: [name], [name] 02/13/2020 8:00 A M Medical Record Number: [id number] Patient Account Number: [id number] Date of Birth/Sex: Treating RN: Mar 23, 1941 (79 y.o. [name]) [name] Primary Care Lorain Keast: Leanord Asal, SA RA H Other Clinician: Referring  Hevin Jeffcoat: Treating Kaylon Hitz/Extender: Candie Mile, SA RA H Weeks in Treatment: 50 Clinic Level of Care Assessment Items TOOL 4 Quantity Score X- 1 0 Use when only an EandM is performed on FOLLOW-UP visit ASSESSMENTS - Nursing Assessment / Reassessment X- 1 10 Reassessment of Co-morbidities (includes updates in patient status) X- 1 5 Reassessment of Adherence to Treatment Plan ASSESSMENTS - Wound and Skin A ssessment / Reassessment X - Simple Wound Assessment / Reassessment - one wound 1 5 []  - 0 Complex Wound Assessment / Reassessment - multiple wounds X- 1 10 Dermatologic / Skin Assessment (not related to wound area) ASSESSMENTS - Focused Assessment X- 1 5 Circumferential Edema Measurements - multi extremities X- 1 10 Nutritional Assessment / Counseling / Intervention []  - 0 Lower Extremity Assessment (monofilament, tuning fork, pulses) []  - 0 Peripheral Arterial Disease Assessment (using hand held doppler) ASSESSMENTS - Ostomy and/or Continence Assessment and Care []  - 0 Incontinence Assessment and Management []  - 0 Ostomy Care Assessment and Management (repouching, etc.) PROCESS - Coordination of Care X - Simple Patient / Family Education for ongoing care 1 15 []  - 0 Complex (extensive) Patient / Family Education for ongoing care X- 1 10 Staff obtains Programmer, systems, Records, T Results / Process Orders est []  - 0 Staff telephones HHA, Nursing Homes / Clarify orders / etc []  - 0 Routine Transfer to another Facility (non-emergent condition) []  - 0 Routine Hospital Admission (non-emergent condition) []  - 0 New Admissions / Biomedical engineer / Ordering NPWT Apligraf, etc. , []  - 0 Emergency Hospital Admission (emergent condition) X- 1 10 Simple Discharge Coordination []  - 0 Complex (extensive) Discharge Coordination PROCESS - Special Needs []  - 0 Pediatric / Minor Patient Management []  - 0 Isolation Patient Management []  - 0 Hearing / Language /  Visual special needs []  - 0 Assessment of Community assistance (transportation, D/C planning, etc.) []  -  0 Additional assistance / Altered mentation []  - 0 Support Surface(s) Assessment (bed, cushion, seat, etc.) INTERVENTIONS - Wound Cleansing / Measurement X - Simple Wound Cleansing - one wound 1 5 []  - 0 Complex Wound Cleansing - multiple wounds X- 1 5 Wound Imaging (photographs - any number of wounds) []  - 0 Wound Tracing (instead of photographs) X- 1 5 Simple Wound Measurement - one wound []  - 0 Complex Wound Measurement - multiple wounds INTERVENTIONS - Wound Dressings []  - 0 Small Wound Dressing one or multiple wounds []  - 0 Medium Wound Dressing one or multiple wounds X- 1 20 Large Wound Dressing one or multiple wounds []  - 0 Application of Medications - topical []  - 0 Application of Medications - injection INTERVENTIONS - Miscellaneous []  - 0 External ear exam []  - 0 Specimen Collection (cultures, biopsies, blood, body fluids, etc.) []  - 0 Specimen(s) / Culture(s) sent or taken to Lab for analysis []  - 0 Patient Transfer (multiple staff / Civil Service fast streamer / Similar devices) []  - 0 Simple Staple / Suture removal (25 or less) []  - 0 Complex Staple / Suture removal (26 or more) []  - 0 Hypo / Hyperglycemic Management (close monitor of Blood Glucose) []  - 0 Ankle / Brachial Index (ABI) - do not check if billed separately X- 1 5 Vital Signs Has the patient been seen at the hospital within the last three years: Yes Total Score: 120 Level Of Care: New/Established - Level 4 Electronic Signature(s) Signed: 02/13/2020 3:47:52 PM By: Deon Pilling Entered By: Deon Pilling on 02/13/2020 08:30:37 -------------------------------------------------------------------------------- Encounter Discharge Information Details Patient Name: Date of Service: Daniel Phenix D. 02/13/2020 8:00 A M Medical Record Number: [id number] Patient Account Number: [id number] Date of  Birth/Sex: Treating RN: 1940-11-26 (79 y.o. [name]) [name] Primary Care Phiona Ramnauth: Leanord Asal, SA RA H Other Clinician: Referring Aliyanah Rozas: Treating Chaunta Bejarano/Extender: Candie Mile, SA RA H Weeks in Treatment: 17 Encounter Discharge Information Items Discharge Condition: Stable Ambulatory Status: Wheelchair Discharge Destination: Home Transportation: Private Auto Accompanied By: wife Schedule Follow-up Appointment: Yes Clinical Summary of Care: Electronic Signature(s) Signed: 02/13/2020 3:47:52 PM By: Deon Pilling Entered By: Deon Pilling on 02/13/2020 08:29:59 -------------------------------------------------------------------------------- Patient/Caregiver Education Details Patient Name: Date of Service: [name] 5/18/2021andnbsp8:00 A M Medical Record Number: [id number] Patient Account Number: [id number] Date of Birth/Gender: Treating RN: 1941/03/06 (78 y.o. [name]) [name] Primary Care Physician: Leanord Asal, SA RA H Other Clinician: Referring Physician: Treating Physician/Extender: Candie Mile, SA RA H Weeks in Treatment: 50 Education Assessment Education Provided To: Patient Education Topics Provided Wound/Skin Impairment: Handouts: Skin Care Do's and Dont's Methods: Explain/Verbal Responses: Reinforcements needed Electronic Signature(s) Signed: 02/13/2020 3:47:52 PM By: Deon Pilling Entered By: Deon Pilling on 02/13/2020 08:29:48 -------------------------------------------------------------------------------- Wound Assessment Details Patient Name: Date of Service: Daniel Phenix D. 02/13/2020 8:00 A M Medical Record Number: [id number] Patient Account Number: [id number] Date of Birth/Sex: Treating RN: Mar 18, 1941 (78 y.o. [name]) [name] Primary Care Zohaib Heeney: Leanord Asal, SA RA H Other Clinician: Referring Simuel Stebner: Treating Keenon Leitzel/Extender: Candie Mile, SA RA H Weeks in Treatment: 50 Wound Status Wound Number: 25 Primary Venous  Leg Ulcer Etiology: Wound Location: Left, Distal, Posterior Lower Leg Secondary Diabetic Wound/Ulcer of the Lower Extremity Wounding Event: Gradually Appeared Etiology: Date Acquired: 10/04/2019 Wound Open Weeks Of Treatment: 18 Status: Clustered Wound: Yes Comorbid Cataracts, Hypertension, Peripheral Venous Disease, Type II History: Diabetes, Gout, Received Radiation Wound Measurements Length: (cm) 1 Width: (cm) 1  Depth: (cm) 0 Clustered Quantity: 1 Area: (cm) Volume: (cm) .1 % Reduction in Area: 95.5% .3 % Reduction in Volume: 95.5% .1 Epithelialization: Small (1-33%) Tunneling: No 1.123 Undermining: No 0.112 Wound Description Classification: Full Thickness Without Exposed Support Structures Wound Margin: Flat and Intact Exudate Amount: Small Exudate Type: Serosanguineous Exudate Color: red, brown Foul Odor After Cleansing: No Slough/Fibrino Yes Wound Bed Granulation Amount: Medium (34-66%) Exposed Structure Granulation Quality: Red Fascia Exposed: No Necrotic Amount: Medium (34-66%) Fat Layer (Subcutaneous Tissue) Exposed: Yes Necrotic Quality: Adherent Slough Tendon Exposed: No Muscle Exposed: No Joint Exposed: No Bone Exposed: No Treatment Notes Wound #25 (Left, Distal, Posterior Lower Leg) 1. Cleanse With Wound Cleanser Soap and water 2. Periwound Care TCA Cream 3. Primary Dressing Applied Polymem Ag 4. Secondary Dressing Dry Gauze 6. Support Layer Applied Kerlix/Coban Notes netting. Electronic Signature(s) Signed: 02/13/2020 3:47:52 PM By: Deon Pilling Signed: 02/13/2020 5:13:38 PM By: [name] RN Entered By: Deon Pilling on 02/13/2020 08:28:33 -------------------------------------------------------------------------------- Vitals Details Patient Name: Date of Service: Daniel Phenix D. 02/13/2020 8:00 A M Medical Record Number: [id number] Patient Account Number: [id number] Date of Birth/Sex: Treating RN: 03/11/1941 (78 y.o. [name]) [name] Primary Care Dailin Sosnowski: Leanord Asal, SA RA H Other Clinician: Referring Shonnie Poudrier: Treating Marla Pouliot/Extender: Candie Mile, SA RA H Weeks in Treatment: 50 Vital Signs Time Taken: 08:10 Temperature (F): 97.5 Height (in): 74 Pulse (bpm): 71 Weight (lbs): 212 Respiratory Rate (breaths/min): 20 Body Mass Index (BMI): 27.2 Blood Pressure (mmHg): 111/68 Reference Range: 80 - 120 mg / dl Electronic Signature(s) Signed: 02/13/2020 3:47:52 PM By: Deon Pilling Entered By: Deon Pilling on 02/13/2020 08:27:54

## 2020-02-14 ENCOUNTER — Other Ambulatory Visit: Payer: Self-pay

## 2020-02-14 ENCOUNTER — Ambulatory Visit (INDEPENDENT_AMBULATORY_CARE_PROVIDER_SITE_OTHER): Payer: Medicare HMO | Admitting: Pharmacist

## 2020-02-14 DIAGNOSIS — Z5181 Encounter for therapeutic drug level monitoring: Secondary | ICD-10-CM

## 2020-02-14 DIAGNOSIS — I4821 Permanent atrial fibrillation: Secondary | ICD-10-CM

## 2020-02-14 LAB — POCT INR: INR: 2.5 (ref 2.0–3.0)

## 2020-02-14 NOTE — Patient Instructions (Signed)
Description   Continue taking 1 tablet daily  Recheck in 5 weeks

## 2020-02-15 NOTE — Progress Notes (Signed)
COLETON STAMP ([id number]) , Visit Report for 02/13/2020 SuperBill Details Patient Name: Date of Service: DENTRELL, COHEE 02/13/2020 Medical Record Number: [id number] Patient Account Number: [id number] Date of Birth/Sex: Treating RN: 1941-08-05 (78 y.o. [name]) [name] Primary Care Provider: Leanord Asal, Progress RA H Other Clinician: Referring Provider: Treating Provider/Extender: Candie Mile, SA RA H Weeks in Treatment: 50 Diagnosis Coding ICD-10 Codes Code Description [phone] Non-pressure chronic ulcer of left calf limited to breakdown of skin I87.333 Chronic venous hypertension (idiopathic) with ulcer and inflammation of bilateral lower extremity E11.51 Type 2 diabetes mellitus with diabetic peripheral angiopathy without gangrene E11.42 Type 2 diabetes mellitus with diabetic polyneuropathy Facility Procedures The patient participates with Medicare or their insurance follows the Medicare Facility Guidelines CPT4 Code Description Modifier Quantity [id number] - WOUND CARE VISIT-LEV 4 EST PT 1 Electronic Signature(s) Signed: 02/13/2020 3:47:52 PM By: Deon Pilling Signed: 02/15/2020 12:52:55 PM By: [name] MD Entered By: Deon Pilling on 02/13/2020 08:30:44

## 2020-02-20 ENCOUNTER — Encounter (HOSPITAL_BASED_OUTPATIENT_CLINIC_OR_DEPARTMENT_OTHER): Payer: Medicare HMO | Admitting: Internal Medicine

## 2020-02-20 DIAGNOSIS — I10 Essential (primary) hypertension: Secondary | ICD-10-CM | POA: Diagnosis not present

## 2020-02-20 DIAGNOSIS — L97221 Non-pressure chronic ulcer of left calf limited to breakdown of skin: Secondary | ICD-10-CM | POA: Diagnosis not present

## 2020-02-20 DIAGNOSIS — L97211 Non-pressure chronic ulcer of right calf limited to breakdown of skin: Secondary | ICD-10-CM | POA: Diagnosis not present

## 2020-02-20 DIAGNOSIS — I482 Chronic atrial fibrillation, unspecified: Secondary | ICD-10-CM | POA: Diagnosis not present

## 2020-02-20 DIAGNOSIS — E1151 Type 2 diabetes mellitus with diabetic peripheral angiopathy without gangrene: Secondary | ICD-10-CM | POA: Diagnosis not present

## 2020-02-20 DIAGNOSIS — I87333 Chronic venous hypertension (idiopathic) with ulcer and inflammation of bilateral lower extremity: Secondary | ICD-10-CM | POA: Diagnosis not present

## 2020-02-20 DIAGNOSIS — I429 Cardiomyopathy, unspecified: Secondary | ICD-10-CM | POA: Diagnosis not present

## 2020-02-20 DIAGNOSIS — E1142 Type 2 diabetes mellitus with diabetic polyneuropathy: Secondary | ICD-10-CM | POA: Diagnosis not present

## 2020-02-20 DIAGNOSIS — I2510 Atherosclerotic heart disease of native coronary artery without angina pectoris: Secondary | ICD-10-CM | POA: Diagnosis not present

## 2020-02-20 DIAGNOSIS — Z7901 Long term (current) use of anticoagulants: Secondary | ICD-10-CM | POA: Diagnosis not present

## 2020-02-20 DIAGNOSIS — M109 Gout, unspecified: Secondary | ICD-10-CM | POA: Diagnosis not present

## 2020-02-20 NOTE — Progress Notes (Signed)
[name] ([id number]) , Visit Report for 02/20/2020 Arrival Information Details Patient Name: Date of Service: [name], [name] 02/20/2020 8:00 A M Medical Record Number: [id number] Patient Account Number: [id number] Date of Birth/Sex: Treating RN: 04/05/1941 (79 y.o. [name] Primary Care Provider: Leanord Asal, SA RA H Other Clinician: Referring Provider: Treating Provider/Extender: Glenice Laine, SA RA H Weeks in Treatment: 25 Visit Information History Since Last Visit Added or deleted any medications: No Patient Arrived: Wheel Chair Any new allergies or adverse reactions: No Arrival Time: 07:53 Had a fall or experienced change in No Accompanied By: wife activities of daily living that may affect Transfer Assistance: None risk of falls: Patient Identification Verified: Yes Signs or symptoms of abuse/neglect since No Secondary Verification Process Completed: Yes last visito Patient Requires Transmission-Based Precautions: No Hospitalized since last visit: No Patient Has Alerts: Yes Implantable device outside of the clinic No Patient Alerts: Patient on Blood Thinner excluding cellular tissue based products placed in the center since last visit: Has Dressing in Place as Prescribed: Yes Has Footwear/Offloading in Place as Yes Prescribed: Left: Surgical Shoe with Pressure Relief Insole Pain Present Now: No Electronic Signature(s) Signed: 02/20/2020 5:12:39 PM By: Kela Millin Entered By: Kela Millin on 02/20/2020 07:54:33 -------------------------------------------------------------------------------- Compression Therapy Details Patient Name: Date of Service: Daniel Phenix D. 02/20/2020 8:00 A M Medical Record Number: [id number] Patient Account Number: [id number] Date of Birth/Sex: Treating RN: 29-Aug-1941 (78 y.o. [name]) [name] Primary Care Provider: Leanord Asal, SA RA H Other Clinician: Referring Provider: Treating Provider/Extender:  Glenice Laine, SA RA H Weeks in Treatment: 51 Compression Therapy Performed for Wound Assessment: Wound #25 Left,Distal,Posterior Lower Leg Performed By: Clinician [name], RN Compression Type: Double Layer Post Procedure Diagnosis Same as Pre-procedure Electronic Signature(s) Signed: 02/20/2020 5:17:39 PM By: [name] RN Entered By: [name] on 02/20/2020 08:10:39 -------------------------------------------------------------------------------- Compression Therapy Details Patient Name: Date of Service: [name], Daniel D. 02/20/2020 8:00 A M Medical Record Number: [id number] Patient Account Number: [id number] Date of Birth/Sex: Treating RN: 05/02/1941 (78 y.o. [name]) [name] Primary Care Provider: Leanord Asal, SA RA H Other Clinician: Referring Provider: Treating Provider/Extender: Glenice Laine, SA RA H Weeks in Treatment: 51 Compression Therapy Performed for Wound Assessment: Wound #29 Left,Anterior Lower Leg Performed By: Clinician [name], RN Compression Type: Double Layer Post Procedure Diagnosis Same as Pre-procedure Electronic Signature(s) Signed: 02/20/2020 5:17:39 PM By: [name] RN Entered By: [name] on 02/20/2020 08:10:40 -------------------------------------------------------------------------------- Encounter Discharge Information Details Patient Name: Date of Service: Daniel Phenix D. 02/20/2020 8:00 A M Medical Record Number: [id number] Patient Account Number: [id number] Date of Birth/Sex: Treating RN: 1941-03-27 (79 y.o. [name] Primary Care Provider: Leanord Asal, SA RA H Other Clinician: Referring Provider: Treating Provider/Extender: Glenice Laine, SA RA H Weeks in Treatment: 56 Encounter Discharge Information Items Discharge Condition: Stable Ambulatory Status: Wheelchair Discharge Destination: Home Transportation: Private Auto Accompanied By: wife Schedule Follow-up Appointment: Yes Clinical  Summary of Care: Patient Declined Electronic Signature(s) Signed: 02/20/2020 5:12:39 PM By: Kela Millin Entered By: Kela Millin on 02/20/2020 08:40:35 -------------------------------------------------------------------------------- Lower Extremity Assessment Details Patient Name: Date of Service: [name], Daniel D. 02/20/2020 8:00 A M Medical Record Number: [id number] Patient Account Number: [id number] Date of Birth/Sex: Treating RN: 16-Apr-1941 (78 y.o. [name] Primary Care Provider: Leanord Asal, SA RA H Other Clinician: Referring Provider: Treating Provider/Extender: Glenice Laine, SA RA H Weeks  in Treatment: 51 Edema Assessment Assessed: [Left: No] [Right: No] Edema: [Left: N] [Right: o] Calf Left: Right: Point of Measurement: 31 cm From Medial Instep 32 cm cm Ankle Left: Right: Point of Measurement: 11 cm From Medial Instep 22.7 cm cm Vascular Assessment Pulses: Dorsalis Pedis Palpable: [Left:Yes] Electronic Signature(s) Signed: 02/20/2020 5:12:39 PM By: Kela Millin Entered By: Kela Millin on 02/20/2020 07:55:18 -------------------------------------------------------------------------------- Multi-Disciplinary Care Plan Details Patient Name: Date of Service: Daniel Phenix D. 02/20/2020 8:00 A M Medical Record Number: [id number] Patient Account Number: [id number] Date of Birth/Sex: Treating RN: [birth year] (78 y.o. [name]) [name] Primary Care Provider: Leanord Asal, SA RA H Other Clinician: Referring Provider: Treating Provider/Extender: Glenice Laine, SA RA H Weeks in Treatment: 12 Active Inactive Wound/Skin Impairment Nursing Diagnoses: Knowledge deficit related to ulceration/compromised skin integrity Goals: Patient/caregiver will verbalize understanding of skin care regimen Date Initiated: 02/28/2019 Target Resolution Date: 03/01/2020 Goal Status: Active Ulcer/skin breakdown will have a volume reduction of 30% by week  4 Date Initiated: 02/28/2019 Date Inactivated: 04/04/2019 Target Resolution Date: 03/31/2019 Goal Status: Met Ulcer/skin breakdown will have a volume reduction of 50% by week 8 Date Initiated: 04/04/2019 Date Inactivated: 05/09/2019 Target Resolution Date: 05/05/2019 Goal Status: Met Ulcer/skin breakdown will have a volume reduction of 80% by week 12 Date Initiated: 05/09/2019 Date Inactivated: 06/13/2019 Target Resolution Date: 06/09/2019 Unmet Reason: comorbities/new Goal Status: Unmet wounds Ulcer/skin breakdown will heal within 14 weeks Date Initiated: 06/13/2019 Date Inactivated: 07/11/2019 Target Resolution Date: 07/07/2019 Goal Status: Unmet Unmet Reason: comorbityies Interventions: Assess patient/caregiver ability to obtain necessary supplies Assess patient/caregiver ability to perform ulcer/skin care regimen upon admission and as needed Assess ulceration(s) every visit Notes: Electronic Signature(s) Signed: 02/20/2020 5:17:39 PM By: [name] RN Entered By: [name] on 02/20/2020 07:48:13 -------------------------------------------------------------------------------- Pain Assessment Details Patient Name: Date of Service: [name], Daniel D. 02/20/2020 8:00 A M Medical Record Number: [id number] Patient Account Number: [id number] Date of Birth/Sex: Treating RN: 1941/03/13 (79 y.o. [name] Primary Care Provider: Leanord Asal, SA RA H Other Clinician: Referring Provider: Treating Provider/Extender: Glenice Laine, SA RA H Weeks in Treatment: 23 Active Problems Location of Pain Severity and Description of Pain Patient Has Paino No Site Locations Pain Management and Medication Current Pain Management: Electronic Signature(s) Signed: 02/20/2020 5:12:39 PM By: Kela Millin Entered By: Kela Millin on 02/20/2020 07:55:04 -------------------------------------------------------------------------------- Patient/Caregiver Education Details Patient  Name: Date of Service: [name] 5/25/2021andnbsp8:00 A M Medical Record Number: [id number] Patient Account Number: [id number] Date of Birth/Gender: Treating RN: [birth year] (78 y.o. [name]) [name] Primary Care Physician: Leanord Asal, SA RA H Other Clinician: Referring Physician: Treating Physician/Extender: Glenice Laine, SA RA H Weeks in Treatment: 22 Education Assessment Education Provided To: Patient Education Topics Provided Wound/Skin Impairment: Methods: Explain/Verbal Responses: State content correctly Electronic Signature(s) Signed: 02/20/2020 5:17:39 PM By: [name] RN Entered By: [name] on 02/20/2020 07:48:51 -------------------------------------------------------------------------------- Wound Assessment Details Patient Name: Date of Service: [name], Daniel D. 02/20/2020 8:00 A M Medical Record Number: [id number] Patient Account Number: [id number] Date of Birth/Sex: Treating RN: 1941/09/22 (79 y.o. [name] Primary Care Provider: Leanord Asal, SA RA H Other Clinician: Referring Provider: Treating Provider/Extender: Glenice Laine, SA RA H Weeks in Treatment: 51 Wound Status Wound Number: 25 Primary Venous Leg Ulcer Etiology: Wound Location: Left, Distal, Posterior Lower Leg Secondary Diabetic Wound/Ulcer of the Lower Extremity Wounding Event: Gradually Appeared Etiology: Date Acquired: 10/04/2019 Wound Open  Weeks Of Treatment: 19 Status: Clustered Wound: Yes Comorbid Cataracts, Hypertension, Peripheral Venous Disease, Type II History: Diabetes, Gout, Received Radiation Wound Measurements Length: (cm) 0.7 Width: (cm) 0.8 Depth: (cm) 0.1 Clustered Quantity: 1 Area: (cm) 0.44 Volume: (cm) 0.044 % Reduction in Area: 98.2% % Reduction in Volume: 98.2% Epithelialization: Small (1-33%) Tunneling: No Undermining: No Wound Description Classification: Full Thickness Without Exposed Support Structures Wound Margin: Flat  and Intact Exudate Amount: Small Exudate Type: Serosanguineous Exudate Color: red, brown Foul Odor After Cleansing: No Slough/Fibrino Yes Wound Bed Granulation Amount: Medium (34-66%) Exposed Structure Granulation Quality: Red Fascia Exposed: No Necrotic Amount: Medium (34-66%) Fat Layer (Subcutaneous Tissue) Exposed: Yes Necrotic Quality: Adherent Slough Tendon Exposed: No Muscle Exposed: No Joint Exposed: No Bone Exposed: No Treatment Notes Wound #25 (Left, Distal, Posterior Lower Leg) 1. Cleanse With Wound Cleanser Soap and water 2. Periwound Care Moisturizing lotion 3. Primary Dressing Applied Polymem Ag 4. Secondary Dressing Dry Gauze 6. Support Layer Applied Kerlix/Coban Notes cotton instead of kerlix and Horticulturist, commercial) Signed: 02/20/2020 5:12:39 PM By: Kela Millin Entered By: Kela Millin on 02/20/2020 07:55:53 -------------------------------------------------------------------------------- Wound Assessment Details Patient Name: Date of Service: [name], Daniel D. 02/20/2020 8:00 A M Medical Record Number: [id number] Patient Account Number: [id number] Date of Birth/Sex: Treating RN: 1941-06-07 (79 y.o. [name] Primary Care Provider: Leanord Asal, SA RA H Other Clinician: Referring Provider: Treating Provider/Extender: Glenice Laine, SA RA H Weeks in Treatment: 51 Wound Status Wound Number: 29 Primary Venous Leg Ulcer Etiology: Wound Location: Left, Anterior Lower Leg Wound Open Wounding Event: Gradually Appeared Status: Date Acquired: 02/20/2020 Comorbid Cataracts, Hypertension, Peripheral Venous Disease, Type II Weeks Of Treatment: 0 History: Diabetes, Gout, Received Radiation Clustered Wound: No Wound Measurements Length: (cm) 0.3 Width: (cm) 0.3 Depth: (cm) 0.1 Area: (cm) 0.071 Volume: (cm) 0.007 % Reduction in Area: % Reduction in Volume: Epithelialization: None Tunneling: No Undermining: No Wound  Description Classification: Full Thickness Without Exposed Support Structures Wound Margin: Distinct, outline attached Exudate Amount: Small Exudate Type: Serosanguineous Exudate Color: red, brown Foul Odor After Cleansing: No Slough/Fibrino No Wound Bed Granulation Amount: Large (67-100%) Exposed Structure Granulation Quality: Pink Fascia Exposed: No Necrotic Amount: None Present (0%) Fat Layer (Subcutaneous Tissue) Exposed: Yes Tendon Exposed: No Muscle Exposed: No Joint Exposed: No Bone Exposed: No Treatment Notes Wound #29 (Left, Anterior Lower Leg) 1. Cleanse With Wound Cleanser Soap and water 2. Periwound Care Moisturizing lotion 3. Primary Dressing Applied Polymem Ag 4. Secondary Dressing Dry Gauze 6. Support Layer Applied Kerlix/Coban Notes cotton instead of kerlix and Horticulturist, commercial) Signed: 02/20/2020 5:12:39 PM By: Kela Millin Entered By: Kela Millin on 02/20/2020 07:56:50 -------------------------------------------------------------------------------- Vitals Details Patient Name: Date of Service: Daniel Phenix D. 02/20/2020 8:00 A M Medical Record Number: [id number] Patient Account Number: [id number] Date of Birth/Sex: Treating RN: 1941-05-06 (79 y.o. [name] Primary Care Provider: Leanord Asal, SA RA H Other Clinician: Referring Provider: Treating Provider/Extender: Glenice Laine, SA RA H Weeks in Treatment: 45 Vital Signs Time Taken: 07:54 Temperature (F): 98.2 Height (in): 74 Pulse (bpm): 68 Weight (lbs): 212 Respiratory Rate (breaths/min): 19 Body Mass Index (BMI): 27.2 Blood Pressure (mmHg): 119/65 Reference Range: 80 - 120 mg / dl Electronic Signature(s) Signed: 02/20/2020 5:12:39 PM By: Kela Millin Entered By: Kela Millin on 02/20/2020 07:54:57

## 2020-02-22 NOTE — Progress Notes (Signed)
[name] ([id number]) , Visit Report for 02/20/2020 HPI Details Patient Name: Date of Service: [name], [name] 02/20/2020 8:00 A M Medical Record Number: [id number] Patient Account Number: [id number] Date of Birth/Sex: Treating RN: [birth year] (79 y.o. [name]) [name] Primary Care Provider: Leanord Asal, SA RA H Other Clinician: Referring Provider: Treating Provider/Extender: Glenice Laine, SA RA H Weeks in Treatment: 58 History of Present Illness Location: Patient presents with a wound to left lower leg. Quality: Patient reports No Pain. Duration: 2 months HPI Description: no cig or alcohol. spontaneous appearance in area of stasis dermamtitis. [name] on metformin only. chronic afib on Coumadin. diabetes and coag studies not good. hba1c 7.5. ivr 4.5. no pain or sxs of systemic disease. hx chf. no intermittent claudication 02/28/2019 Readmission This is a now a 79 year old man who was previously cared for in 2016 by Dr. Lindon Romp for wounds on his lower extremities. At that point he had venous reflux studies although I cannot seem to open these in [facility]. He had arterial studies showing an ABI of 1.11 on the right and 1.27 on the left his waveforms were triphasic bilaterally. He was discharged in stockings although I do not believe he is wearing these in some time. He tells me that about a month ago he noted openings of a large wound on the posterior right calf and 2 smaller areas on the left lateral calf and a small area more recently on the left posterior calf. He has been dressing these with peroxide and triple antibiotic ointment. He is not wearing compression. Past medical history; type 2 diabetes with peripheral neuropathy, chronic venous insufficiency, hypertension, cardiomyopathy, chronic atrial fibrillation on Coumadin, prostate cancer, hyperlipidemia, gout, ABI in our clinic was 1.34 on the left and not obtainable on the right 6/9; this is a patient who has chronic  venous insufficiency. He has a fairly substantial area on the right posterior calf, left lateral calf and a small area on the left posterior calf. On arrival last week he had very palpable popliteal and femoral pulses but nothing in his bilateral feet. Unfortunately we cannot get arterial studies until July 1 at Dr. Kennon Holter office. They live in [place]. We use silver alginate under Kerlix Coban 6/16; patient with chronic venous insufficiency with wounds on his bilateral lower extremities. When he came into our clinic he was discovered to have a complete absence of peripheral pedal pulses at either the dorsalis pedis or posterior tibial. He does have easily palpable femoral and popliteal pulses. He sees Dr. [name] tomorrow for noninvasive arterial tests. He may also require venous reflux evaluation although I do not view this as an urgent thing. We have been using silver alginate. His wound surfaces of cleaned up quite nicely 6/23; patient with chronic venous insufficiency with wounds on his bilateral lower extremities. His wounds all are somewhat better looking. He did go to Dr. Kennon Holter office but somehow ended up on the doctors schedule rather than being scheduled for noninvasive tests therefore his noninvasive tests are scheduled for July 1. We agree that he has venous insufficiency ulcers but I cannot feel any pulses in his lower extremities dictating the need for test. We are only using Kerlix and light Coban unfortunately this appears to be holding the edema 6/30; has his arterial studies tomorrow. We have been using Kerlix and light Coban will go to a more aggressive compression if the arterial studies will allow. We all agreed these are venous wounds however  I cannot feel pulses at either the dorsalis pedis or posterior tibial bilaterally. His wounds generally look some better including left lateral and right posterior. 7/7-Patient returns at 1 week in Kerlix/Coban to both legs, with  improvement, in the left lateral and right posterior lower leg wounds, ABI's are normal in both legs per vascular studies, TBI is also normal on both sides, we are using hydrofera blue to the wounds 7/14; patient's arterial studies from 2 weeks ago showed an ABI on the right at 1.03 with a TBI of 0.86. On the left the ABI was 1.06 with a TBI of 0.84. Notable for the fact that his arterial waveforms were monophasic in all of the lower extremity arteries suggesting some degree of arterial occlusive disease but in general this was felt to be fairly adequate for healing. His compression was increased from 2-3 layer which is appropriate. Dressing was changed to [facility] 7/21; patient's wounds are measuring smaller. The more substantial one on the right posterior calf, second 1 on the left lateral calf. Using [facility] on both wound areas 7/28; patient continues to make nice improvements. The area on the right posterior calf is smaller. Area on the left lateral calf also is smaller. We have been using Hydrofera Blue under compression. The patient will need compression stockings and we have measured him for these in the eventuality that these heal which really should not be too long from now 8/4-Patient continues to make improvement, the right posterior calf area smaller with rim of keratotic skin on one side, the left wound is definitely smaller and improving. 8/11-Returns at 1 week, after being in 3 layer compression on both legs, both wounds appear to be improving, making good progress, patient is happy, pain is also less especially in the right leg wound 8/18; the area on the left anterior lower leg is healed. On the right posterior leg the wound remains although the dimensions are a lot better. 8/25; he arrives in clinic today with a large body of open wound on the left lateral calf. All of the 3 wounds in this area are in close juxtaposition to each other. The story is that we discharged  him last week with no a wrap on the left leg. They went to [place] could not get in as they are only excepting phone orders or online orders for stockings hence they did not put any stocking on the left leg all week. They have something at home but the patient with that was either incapable or just did not put them on. Apparently these opened 1 morning after getting out of bed. The area on the right has no real change 9/1; patient has bilateral lower extremity wounds in the setting of severe chronic venous insufficiency and secondary lymphedema. He arrived last week with new areas on the left lateral lower leg after we did not wrap him and he did not use his stockings. Nevertheless the areas on the left look better today under compression. Posterior right calf does not really changed. We are using Hydrofera Blue on both areas under compression 9/15; bilateral lower extremity wounds in the setting of severe chronic venous insufficiency and secondary lymphedema. He has 20 to 30 mmHg below-knee compression stockings under the eventuality that these close over. We did get the left leg to close but he did not transition to a stocking and this reopened. There are 2 open areas on the left posterior lateral calf and one on the right. Both of these  look satisfactory. Using [facility] 9/22; bilateral lower extremity wounds in the setting of chronic venous insufficiency. 2 superficial areas on the left lateral calf. One on the right just above the Achilles area. We have good edema control we have been using Hydrofera Blue 9/29; the areas on the left lateral calf are healed. On the right just above the Achilles and tendon area things look a lot better small wounds one scabbed area. We have been using Hydrofera Blue. We can discharge him in his own stocking on the left still wrapping on the right. This is the second time we have healed the left leg but he did not put a stocking on last time. Hopefully this  will maintain the edema from chronic venous disease with secondary lymphedema 10/6; he comes in today having a stocking on the left leg. They had trouble getting it on there is a lot of increase in swelling 2 small open areas one anteriorly and one on the medial calf. They report a lot of difficulty getting the stocking on. Paradoxically the area on the right that we have been wrapping posteriorly is closed 10/13; he comes in today with wounds bilaterally including superficial areas on the left medial and left lateral calf. As well as the right posterior has reopened in the Achilles area superiorly. He still does not have his juxta lite stockings although truthfully we would not of been able to use them today anyway. Apparently have been ordered and paid for from [employer] although they have not been delivered 10/20; his area on the right is just the boat closed on the right posterior. Still has the area on the left lateral and a very tiny area on the left medial. He has his bilateral juxta lites although he is not ready for them this week. He tolerated the increase to 4 layer compression last week quite well 10/27; the area on the right posterior calf is once again closed. He has a superficial area on the left lateral calf that is still open. He has been using Hydrofera Blue and bilateral 4-layer compression. He can change to his own juxta lite stocking on the right and we are instructing him today 11/3; the area on the right posterior calf reopened according to the patient and his wife after they took off the stocking when they got home last week. Apparently scabbed over there is now a fairly substantial wound which looks pretty much the same. Our intake nurse noted that they were using the juxta lite stockings appropriately. I was really hoping I might be able to close him out today. He has 1 very tiny remaining area on the left lateral lower leg. 11/10; right posterior calf wound measures smaller but  is still open. We have been using Hydrofera Blue. On the left he has a small oval-shaped wound and he seems to have had another wound distally that is open and likely a blister. We are using Hydrofera Blue under compression 11/17; right posterior calf wound continues to get better. We have been using Hydrofera Blue. On the left lateral one of the wounds has closed still a small open area. We have been using Hydrofera Blue on this as well. Both areas have been under 4-layer compression Arrives in clinic today with some swelling in the dorsal foot on the right some erythema of his forefoot and toes. Initially when I looked at this I almost thought this was a sunburn distal to a wrap injury. 12/1; right posterior calf wound  debrided with a curette. We have been using Hydrofera Blue on the left anterior lateral he has an area across the mid tibia. Finally a small area on the left lateral lower calf. Finally he continues to have de-epithelialized areas on the dorsal aspect of his toes. Initially thought this might be a burn injury when I saw him 2 weeks ago. I now wonder about tinea. I have also reviewed his arterial studies which were really quite good in July/20 with normal TBI's and ABIs but monophasic waveforms 12/8; comes in today with worsening problems especially on the left leg where he now has a cluster of wounds in the left anterior mid tibia. Very poor edema control. I reduced him to 3 layer from 4 layer compression last week because of some concern about blood flow to his toes however he does not have good edema control on the left leg. Right leg edema control looks satisfactory. On the left he has a cluster of wounds anteriorly, small area on the left medial fifth met head and then the collection of areas on his toes which appear better On the right he has the original area on the right posterior calf, a new area right medially. His formal arterial studies from mid July are noted below. He was  evaluated by Dr.Berry ABI Findings: +---------+------------------+-----+----------+--------+ Right Rt Pressure (mmHg)IndexWaveform Comment  +---------+------------------+-----+----------+--------+ Brachial 176     +---------+------------------+-----+----------+--------+ ATA 176 0.99 monophasic  +---------+------------------+-----+----------+--------+ PTA 183 1.03 monophasic  +---------+------------------+-----+----------+--------+ PERO 172 0.97 monophasic  +---------+------------------+-----+----------+--------+ Great T oe153 0.86    +---------+------------------+-----+----------+--------+ +---------+------------------+-----+-----------+-------+ Left Lt Pressure (mmHg)IndexWaveform Comment +---------+------------------+-----+-----------+-------+ Brachial 178     +---------+------------------+-----+-----------+-------+ ATA 162 0.91 multiphasic  +---------+------------------+-----+-----------+-------+ PTA 188 1.06 multiphasic  +---------+------------------+-----+-----------+-------+ PERO 158 0.89 monophasic   +---------+------------------+-----+-----------+-------+ Great T oe150 0.84    +---------+------------------+-----+-----------+-------+ +-------+-----------+-----------+------------+------------+ ABI/TBIT oday's ABIT oday's TBIPrevious ABIPrevious TBI +-------+-----------+-----------+------------+------------+ Right 1.03 0.86 1.11   +-------+-----------+-----------+------------+------------+ Left 1.06 0.84 1.27   +-------+-----------+-----------+------------+------------+ Tibial waveforms somewhat difficult to record due to irregular heartbeat. Bilateral ABIs appear essentially unchanged compared to prior study on 06/21/15. Summary: Right: Resting right ankle-brachial index is within normal range. No evidence of significant right lower extremity arterial disease. The right toe-brachial  index is normal. Although ankle brachial indices are within normal limits (0.95-1.29), arterial Doppler waveforms at the ankle suggest some component of arterial occlusive disease. Left: Resting left ankle-brachial index is within normal range. No evidence of significant left lower extremity arterial disease. The left toe-brachial index is normal. Although ankle brachial indices are within normal limits (0.95-1.29), arterial Doppler waveforms at the ankle suggest some component of arterial occlusive disease. 12/15; the patient's area on the left mid tibia looks better. Right posterior calf also better. He has the area on the left foot as well. All of his toes look better I think this was tinea. We are using Hydrofera Blue everywhere else The patient was in urgent care yesterday with wheezing and shortness of breath. He got azithromycin and prednisone. He feels better. His lungs are currently clear to auscultation. He was not tested for Covid 19 12/29; the patient arrives in clinic today with quite a bit change. 2 weeks ago he only had areas on the left mid tibia right posterior calf with tinea pedis resolving between his toes. He arrives in clinic today with several areas on the dorsal toes on the right, dorsal left second toe. He has skin breakdown in the left medial calf probably from excess edema. Small area proximally in the medial calf. He has  weeping edema fluid coming out of the skin excoriations on the left medial calf. He tells me that he is having a cardiac catheterization next week. I had a quick look at [facility]. He was found to have an ejection fraction of 25% during the work-up for persistent atrial fibrillation. He saw his cardiology office yesterday seen by the nurse practitioner. She increased his carvedilol. He has not been on diuretics for apparently several months and indeed in the nurse practitioner Dietrich Pates notes she had knowledge of this. 10/04/2019 on evaluation  today patient presents as a walk-in visit concerning the fact that he did not have an appointment here for our clinic at this point. He actually had a cardiac catheterization earlier today and then came from there to here in order to be evaluated. With that being said unfortunately he is having significant cellulitis of his left lower extremity upon evaluation today this appears to have deteriorated even since last week's evaluation with Dr. Dellia Nims. The right lower extremity is actually doing okay I really see no evidence of deterioration at this point at those locations. In fact the right leg seems in general be doing quite well. Nonetheless I am concerned about infection and cellulitis of the left lower extremity and again considering his weakened heart I do not want him to develop into sepsis at all. He is also having some trouble breathing today and I understand according to nursing staff this is always the case to some degree. With that being said the patient unfortunately seems to be in my opinion a little bit worse even his wife feels like that may be the case today. Unsure exactly what is leading to this. Cardiac catheterization I did review the report which showed an ejection fraction of 25% he also had an LAD blockage of around 25% based on what I saw. With that being said there was no significant blockages that required stenting at this point he does have weakened cardiac muscles compared to normal. 10/05/2019; patient was seen yesterday in clinic. He was sent to the ER because of cellulitis of the left leg possibility. In the ER he was given 1 dose of IV Levaquin and discharged on Keflex. He came in the clinic initially for a nurse visit to rewrap his left leg. We did not look at the right leg today that is an Haematologist. The patient also had a cardiac cath. According to him there were no blockages but a very low ejection fraction. 1/12; back for an early follow-up. The condition of the left  lower leg is a lot better although there are multiple open areas. All of them with not a particularly viable surface. On the right posterior calf he has a single wound with a clean surface. He has a wound with exposed bone on the PIP of the left second toe dorsally. He has wounds on the dorsal right first second and third toes. His arterial studies were normal. 1/19; the patient has 3 open wounds on the left leg anteriorly in the mid tibia, distally and medially just above the ankle and posteriorly. On the right he has a small area dime sized on the right posterior calf. His edema control is a lot better. He still has wounds on the right second and left second toes. The left second toe has exposed bone. X-ray I did last week did not show evidence of osteomyelitis in the left foot. 1/26; patient with a multiplicity of wounds and problems. On the  left he has a circular area on the left anterior mid tibia Left just above the medical ankle -left 2nd toe pip -right 2nd toe right posterior calf 2/2; patient with a multiplicity of wounds and problems. He has severe chronic venous insufficiency and the wounds on his leg are all on the left left anterior left medial at the medial malleolus and left posterior. We have been using Iodoflex to these areas to help with ongoing debridement. He also has largely traumatic wounds on his toes this includes the left second with exposed bone. Bone culture I did last week showed Staph aureus which is methicillin sensitive. I discussed with him today the idea of an amputation of this toe because it is literally nonfunctional however he wants to try antibiotics. Antibiotic choice is complicated by the fact that he is on Coumadin. He also has wounds over the dorsal part of the right first which is close to closed. Right second toe has exposed bone and the right third toe at the base of the right third toe is just about closed as well. We have been using silver  alginate 2/9; patient with severe chronic venous insufficiency. He has large wounds on the left anterior mid tibia and on the left medial just above the ankle. I am concerned today about the depth of the area on the left mid tibia may be exposing into the muscle. He has small areas on the left posterior calf and on the right posterior calf although these do not look as threatening. He also has traumatic wounds on his toes. The right first and third are closed however the bilateral second toes have exposed bone. The area on the left is open into the distal interphalangeal joint. In my opinion these toes are nonsalvageable and will need amputation 2/16; patient with severe chronic venous insufficiency. He has wounds on the left anterior mid tibia left medial lower leg just above the ankle. He has small areas on the left posterior calf and a more prominent area on the right posterior calf. All of these are related to poorly controlled venous hypertension. He also had traumatic wounds on the PIPs of both second toes. Unfortunately both of these have exposed bone and in the case of the left this I think goes right into the joint itself. I do not think either 1 of these is salvageable. I have reviewed his arterial evaluation that was done in July last year. He also saw Dr. [name] in June. He felt these were venous stasis. Previously had segmental arterial pressures performed in 11/03/2014 which were normal. He was sent for for a follow-up arterial evaluation on July 1. On the right his ABIs were 1.03 with a TBI of 0.86 although his waveforms were monophasic on the left he had multiphasic waveforms at the ATA PTA but monophasic at the peroneal nevertheless his ABI was normal at 1.06 TBI also normal at 0.84. I think he has enough blood flow to heal an amputation which I think he needs of the left second toe and probably the right second toe as well 2/23; the patient is going for his bilateral second toe  amputation by Dr. Amalia Hailey on Friday. In the meantime is 3 areas on the left leg and the small area on the right posterior look better. We have been using silver collagen. 3/2; the patient's toe amputations were canceled because of cardiac concerns. Apparently this surgery will need to be done in the hospital although they do not have an  appointment. In the meantime he is continues to have 3 areas on the left leg one anteriorly and 2 smaller areas posteriorly on the left as well as the small posterior area on the right. We have been using silver collagen with compression 3/9; still no word on the second toe amputations bilaterally. 3 wounds on the left leg all look better and the area on the right posterior we have been using silver collagen I change this to [facility] 3/16; amputations next Monday both second toes he is apparently going to be hospitalized. He has on his left leg including left anterior and left lateral look better very superficial also the right posterior. 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 He continues to have left leg wounds anteriorly x2 posteriorly x1 at the left lower leg just above the ankle anteriorly x1 and posteriorly on the right x1 3/30; his anterior left leg wounds are considerably smaller. In fact the only wound that is not smaller is on the right. We have been using Hydrofera Blue under Kerlix Cobax 4/6; his anterior left leg wounds continue to contract in fact the area distally and medially has closed over. He still has the area anteriorly on the left. Both posterior calf wounds on the left and right are much smaller. We saw his surgical wound at the left second toe amputation site that still sutured it looks healthy, this is being followed by his podiatrist. We went over what compression he has he has bilateral juxta lites I am hopeful the issue here was that  we just did not have them tight enough because he broke down very quickly the last time we healed him out. 4/13; the patient has 3 remaining wounds one small area on the left anterior lower leg one on the left posterior and one on the right anterior. We have been using Hydrofera Blue under compression compression all of these are contracting. We did discuss life in the eventuality he will totally closed which seems likely in the next week or 2. He has bilateral juxta lite stockings. These will need to be set at 30-40 compression. Since we last closed him over for his bilateral lower extremity wounds he is also been treated for heart failure which may help. He undoubtedly has severe chronic venous hypertension however as well 4/19; the area on the left anterior tibial area is closed. The area on the right posterior calf is just above closed. The area on the left posterior calf has some depth. 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 Everything looks quite good here. He does not need a specific dressing 4/27; the right posterior leg wound is closed there is nothing open on the right. Still has a small punched out area on the left posterior calf. He will be transitioned into the juxta lite stocking on the right today. Still wrapping the left leg 5/4; right leg is still closed although we did not take the juxta lite stocking off. Will do this next week. Small punched out area on the left posterior calf is still not healed we have been using silver collagen under compression 5/11; right leg is still closed although he has dry flaking skin on the dorsal and plantar  part of his foot. No real evidence of infection. The one remaining wound on the left posterior calf. This is actually measuring somewhat larger we have been using Hydrofera  Blue 02/20/20-Patient returns at 2 weeks, the wound on the posterior calf is about the same, there are 2 new areas on the anterior shin on the left both are very small and seem more like abrasion wounds, there is also a circular area on the posterior aspect of rash which is very very mild Electronic Signature(s) Signed: 02/20/2020 8:14:07 AM By: Tobi Bastos MD, MBA Entered By: Tobi Bastos on 02/20/2020 08:14:07 -------------------------------------------------------------------------------- Physical Exam Details Patient Name: Date of Service: Daniel Phenix D. 02/20/2020 8:00 A M Medical Record Number: [id number] Patient Account Number: [id number] Date of Birth/Sex: Treating RN: 09-23-1941 (79 y.o. [name]) [name] Primary Care Provider: Leanord Asal, SA RA H Other Clinician: Referring Provider: Treating Provider/Extender: Glenice Laine, SA RA H Weeks in Treatment: 66 Constitutional alert and oriented x 3. sitting or standing blood pressure is within target range for patient.. supine blood pressure is within target range for patient.. pulse regular and within target range for patient.Marland Kitchen respirations regular, non-labored and within target range for patient.Marland Kitchen temperature within target range for patient.. . . Well- nourished and well-hydrated in no acute distress. Notes Left posterior calf ulcer circumscribed, healthy granulation and base, surrounding skin intact Circular area of rash-suggestive of ringworm-not scaly, appears to be fading, per patient no itching Electronic Signature(s) Signed: 02/20/2020 8:15:00 AM By: Tobi Bastos MD, MBA Entered By: Tobi Bastos on 02/20/2020 08:15:00 -------------------------------------------------------------------------------- Physician Orders Details Patient Name: Date of Service: Daniel Phenix D. 02/20/2020 8:00 A M Medical Record Number: [id number] Patient Account Number: [id number] Date of Birth/Sex: Treating RN: 1941-02-26 (78 y.o.  [name]) [name] Primary Care Provider: Leanord Asal, SA RA H Other Clinician: Referring Provider: Treating Provider/Extender: Glenice Laine, SA RA H Weeks in Treatment: 30 Verbal / Phone Orders: No Diagnosis Coding ICD-10 Coding Code Description L97.221 Non-pressure chronic ulcer of left calf limited to breakdown of skin I87.333 Chronic venous hypertension (idiopathic) with ulcer and inflammation of bilateral lower extremity E11.51 Type 2 diabetes mellitus with diabetic peripheral angiopathy without gangrene E11.42 Type 2 diabetes mellitus with diabetic polyneuropathy Follow-up Appointments ppointment in 1 week. - Tuesday Return A Dressing Change Frequency Wound #25 Left,Distal,Posterior Lower Leg Do not change entire dressing for one week. Wound Cleansing May shower with protection. Primary Wound Dressing Wound #25 Left,Distal,Posterior Lower Leg Polymem Silver Secondary Dressing Dry Gauze - secure toes with tape Edema Control Wound #25 Left,Distal,Posterior Lower Leg Kerlix and Coban - Left Lower Extremity Support Garment 20-30 mm/Hg pressure to: - right lower leg, apply lotion daily with special attention to ankle area Off-Loading Open toe surgical shoe to: - left and right foot Electronic Signature(s) Signed: 02/20/2020 5:17:39 PM By: [name] RN Signed: 02/22/2020 4:57:23 PM By: Tobi Bastos MD, MBA Entered By: [name] on 02/20/2020 08:09:28 -------------------------------------------------------------------------------- Problem List Details Patient Name: Date of Service: Daniel Phenix D. 02/20/2020 8:00 A M Medical Record Number: [id number] Patient Account Number: [id number] Date of Birth/Sex: Treating RN: [birth year] (79 y.o. [name]) [name] Primary Care Provider: Leanord Asal, SA RA H Other Clinician: Referring Provider: Treating Provider/Extender: Glenice Laine, SA RA H Weeks in Treatment: 51 Active Problems ICD-10 Encounter Code Description  Active Date MDM Diagnosis L97.221 Non-pressure chronic ulcer of left calf limited to breakdown of skin 02/28/2019 No Yes I87.333 Chronic venous  hypertension (idiopathic) with ulcer and inflammation of 02/28/2019 No Yes bilateral lower extremity E11.51 Type 2 diabetes mellitus with diabetic peripheral angiopathy without gangrene 02/28/2019 No Yes E11.42 Type 2 diabetes mellitus with diabetic polyneuropathy 02/28/2019 No Yes Inactive Problems ICD-10 Code Description Active Date Inactive Date L97.211 Non-pressure chronic ulcer of right calf limited to breakdown of skin 02/28/2019 02/28/2019 B35.3 Tinea pedis 09/05/2019 09/05/2019 L97.521 Non-pressure chronic ulcer of other part of left foot limited to breakdown of skin 09/26/2019 09/26/2019 L97.524 Non-pressure chronic ulcer of other part of left foot with necrosis of bone 10/10/2019 10/10/2019 L97.511 Non-pressure chronic ulcer of other part of right foot limited to breakdown of skin 09/26/2019 09/26/2019 E99.37 Chronic systolic (congestive) heart failure 10/05/2019 10/05/2019 J69.678 Other chronic osteomyelitis, left ankle and foot 10/31/2019 10/31/2019 Resolved Problems Electronic Signature(s) Signed: 02/20/2020 5:17:39 PM By: [name] RN Signed: 02/22/2020 4:57:23 PM By: Tobi Bastos MD, MBA Entered By: [name] on 02/20/2020 07:47:32 -------------------------------------------------------------------------------- Progress Note Details Patient Name: Date of Service: Daniel Phenix D. 02/20/2020 8:00 A M Medical Record Number: [id number] Patient Account Number: [id number] Date of Birth/Sex: Treating RN: 1940-11-24 (79 y.o. [name]) [name] Primary Care Provider: Leanord Asal, SA RA H Other Clinician: Referring Provider: Treating Provider/Extender: Glenice Laine, SA RA H Weeks in Treatment: 51 Subjective History of Present Illness (HPI) The following HPI elements were documented for the patient's wound: Location: Patient presents with a wound to  left lower leg. Quality: Patient reports No Pain. Duration: 2 months no cig or alcohol. spontaneous appearance in area of stasis dermamtitis. [name] on metformin only. chronic afib on Coumadin. diabetes and coag studies not good. hba1c 7.5. ivr 4.5. no pain or sxs of systemic disease. hx chf. no intermittent claudication 02/28/2019 Readmission This is a now a 79 year old man who was previously cared for in 2016 by Dr. Lindon Romp for wounds on his lower extremities. At that point he had venous reflux studies although I cannot seem to open these in [facility] link. He had arterial studies showing an ABI of 1.11 on the right and 1.27 on the left his waveforms were triphasic bilaterally. He was discharged in stockings although I do not believe he is wearing these in some time. He tells me that about a month ago he noted openings of a large wound on the posterior right calf and 2 smaller areas on the left lateral calf and a small area more recently on the left posterior calf. He has been dressing these with peroxide and triple antibiotic ointment. He is not wearing compression. Past medical history; type 2 diabetes with peripheral neuropathy, chronic venous insufficiency, hypertension, cardiomyopathy, chronic atrial fibrillation on Coumadin, prostate cancer, hyperlipidemia, gout, ABI in our clinic was 1.34 on the left and not obtainable on the right 6/9; this is a patient who has chronic venous insufficiency. He has a fairly substantial area on the right posterior calf, left lateral calf and a small area on the left posterior calf. On arrival last week he had very palpable popliteal and femoral pulses but nothing in his bilateral feet. Unfortunately we cannot get arterial studies until July 1 at Dr. Kennon Holter office. They live in [place]. We use silver alginate under Kerlix Coban 6/16; patient with chronic venous insufficiency with wounds on his bilateral lower extremities. When he came into our clinic he was  discovered to have a complete absence of peripheral pedal pulses at either the dorsalis pedis or posterior tibial. He does have easily palpable femoral and popliteal  pulses. He sees Dr. [name] tomorrow for noninvasive arterial tests. He may also require venous reflux evaluation although I do not view this as an urgent thing. We have been using silver alginate. His wound surfaces of cleaned up quite nicely 6/23; patient with chronic venous insufficiency with wounds on his bilateral lower extremities. His wounds all are somewhat better looking. He did go to Dr. Kennon Holter office but somehow ended up on the doctors schedule rather than being scheduled for noninvasive tests therefore his noninvasive tests are scheduled for July 1. We agree that he has venous insufficiency ulcers but I cannot feel any pulses in his lower extremities dictating the need for test. We are only using Kerlix and light Coban unfortunately this appears to be holding the edema 6/30; has his arterial studies tomorrow. We have been using Kerlix and light Coban will go to a more aggressive compression if the arterial studies will allow. We all agreed these are venous wounds however I cannot feel pulses at either the dorsalis pedis or posterior tibial bilaterally. His wounds generally look some better including left lateral and right posterior. 7/7-Patient returns at 1 week in Kerlix/Coban to both legs, with improvement, in the left lateral and right posterior lower leg wounds, ABI's are normal in both legs per vascular studies, TBI is also normal on both sides, we are using hydrofera blue to the wounds 7/14; patient's arterial studies from 2 weeks ago showed an ABI on the right at 1.03 with a TBI of 0.86. On the left the ABI was 1.06 with a TBI of 0.84. Notable for the fact that his arterial waveforms were monophasic in all of the lower extremity arteries suggesting some degree of arterial occlusive disease but in general this was felt to  be fairly adequate for healing. His compression was increased from 2-3 layer which is appropriate. Dressing was changed to [facility] 7/21; patient's wounds are measuring smaller. The more substantial one on the right posterior calf, second 1 on the left lateral calf. Using [facility] on both wound areas 7/28; patient continues to make nice improvements. The area on the right posterior calf is smaller. Area on the left lateral calf also is smaller. We have been using Hydrofera Blue under compression. The patient will need compression stockings and we have measured him for these in the eventuality that these heal which really should not be too long from now 8/4-Patient continues to make improvement, the right posterior calf area smaller with rim of keratotic skin on one side, the left wound is definitely smaller and improving. 8/11-Returns at 1 week, after being in 3 layer compression on both legs, both wounds appear to be improving, making good progress, patient is happy, pain is also less especially in the right leg wound 8/18; the area on the left anterior lower leg is healed. On the right posterior leg the wound remains although the dimensions are a lot better. 8/25; he arrives in clinic today with a large body of open wound on the left lateral calf. All of the 3 wounds in this area are in close juxtaposition to each other. The story is that we discharged him last week with no a wrap on the left leg. They went to [facility] could not get in as they are only excepting phone orders or online orders for stockings hence they did not put any stocking on the left leg all week. They have something at home but the patient with that was either incapable or just did  not put them on. Apparently these opened 1 morning after getting out of bed. The area on the right has no real change 9/1; patient has bilateral lower extremity wounds in the setting of severe chronic venous insufficiency and secondary  lymphedema. He arrived last week with new areas on the left lateral lower leg after we did not wrap him and he did not use his stockings. Nevertheless the areas on the left look better today under compression. Posterior right calf does not really changed. We are using Hydrofera Blue on both areas under compression 9/15; bilateral lower extremity wounds in the setting of severe chronic venous insufficiency and secondary lymphedema. He has 20 to 30 mmHg below-knee compression stockings under the eventuality that these close over. We did get the left leg to close but he did not transition to a stocking and this reopened. There are 2 open areas on the left posterior lateral calf and one on the right. Both of these look satisfactory. Using [facility] 9/22; bilateral lower extremity wounds in the setting of chronic venous insufficiency. 2 superficial areas on the left lateral calf. One on the right just above the Achilles area. We have good edema control we have been using Hydrofera Blue 9/29; the areas on the left lateral calf are healed. On the right just above the Achilles and tendon area things look a lot better small wounds one scabbed area. We have been using Hydrofera Blue. We can discharge him in his own stocking on the left still wrapping on the right. This is the second time we have healed the left leg but he did not put a stocking on last time. Hopefully this will maintain the edema from chronic venous disease with secondary lymphedema 10/6; he comes in today having a stocking on the left leg. They had trouble getting it on there is a lot of increase in swelling 2 small open areas one anteriorly and one on the medial calf. They report a lot of difficulty getting the stocking on. ooParadoxically the area on the right that we have been wrapping posteriorly is closed 10/13; he comes in today with wounds bilaterally including superficial areas on the left medial and left lateral calf. As well as  the right posterior has reopened in the Achilles area superiorly. He still does not have his juxta lite stockings although truthfully we would not of been able to use them today anyway. Apparently have been ordered and paid for from [employer] although they have not been delivered 10/20; his area on the right is just the boat closed on the right posterior. Still has the area on the left lateral and a very tiny area on the left medial. He has his bilateral juxta lites although he is not ready for them this week. He tolerated the increase to 4 layer compression last week quite well 10/27; the area on the right posterior calf is once again closed. He has a superficial area on the left lateral calf that is still open. He has been using Hydrofera Blue and bilateral 4-layer compression. He can change to his own juxta lite stocking on the right and we are instructing him today 11/3; the area on the right posterior calf reopened according to the patient and his wife after they took off the stocking when they got home last week. Apparently scabbed over there is now a fairly substantial wound which looks pretty much the same. Our intake nurse noted that they were using the juxta lite stockings appropriately. I  was really hoping I might be able to close him out today. He has 1 very tiny remaining area on the left lateral lower leg. 11/10; right posterior calf wound measures smaller but is still open. We have been using Hydrofera Blue. On the left he has a small oval-shaped wound and he seems to have had another wound distally that is open and likely a blister. We are using Hydrofera Blue under compression 11/17; right posterior calf wound continues to get better. We have been using Hydrofera Blue. On the left lateral one of the wounds has closed still a small open area. We have been using Hydrofera Blue on this as well. Both areas have been under 4-layer compression Arrives in clinic today with some swelling in the  dorsal foot on the right some erythema of his forefoot and toes. Initially when I looked at this I almost thought this was a sunburn distal to a wrap injury. 12/1; right posterior calf wound debrided with a curette. We have been using Hydrofera Blue on the left anterior lateral he has an area across the mid tibia. Finally a small area on the left lateral lower calf. Finally he continues to have de-epithelialized areas on the dorsal aspect of his toes. Initially thought this might be a burn injury when I saw him 2 weeks ago. I now wonder about tinea. I have also reviewed his arterial studies which were really quite good in July/20 with normal TBI's and ABIs but monophasic waveforms 12/8; comes in today with worsening problems especially on the left leg where he now has a cluster of wounds in the left anterior mid tibia. Very poor edema control. I reduced him to 3 layer from 4 layer compression last week because of some concern about blood flow to his toes however he does not have good edema control on the left leg. Right leg edema control looks satisfactory. ooOn the left he has a cluster of wounds anteriorly, small area on the left medial fifth met head and then the collection of areas on his toes which appear better ooOn the right he has the original area on the right posterior calf, a new area right medially. His formal arterial studies from mid July are noted below. He was evaluated by Dr.Berry ABI Findings: +---------+------------------+-----+----------+--------+ Right Rt Pressure (mmHg)IndexWaveform Comment  +---------+------------------+-----+----------+--------+ Brachial 176     +---------+------------------+-----+----------+--------+ ATA 176 0.99 monophasic  +---------+------------------+-----+----------+--------+ PTA 183 1.03 monophasic  +---------+------------------+-----+----------+--------+ PERO 172 0.97 monophasic   +---------+------------------+-----+----------+--------+ Great T oe153 0.86    +---------+------------------+-----+----------+--------+ +---------+------------------+-----+-----------+-------+ Left Lt Pressure (mmHg)IndexWaveform Comment +---------+------------------+-----+-----------+-------+ Brachial 178     +---------+------------------+-----+-----------+-------+ ATA 162 0.91 multiphasic  +---------+------------------+-----+-----------+-------+ PTA 188 1.06 multiphasic  +---------+------------------+-----+-----------+-------+ PERO 158 0.89 monophasic   +---------+------------------+-----+-----------+-------+ Great T oe150 0.84    +---------+------------------+-----+-----------+-------+ +-------+-----------+-----------+------------+------------+ ABI/TBIT oday's ABIT oday's TBIPrevious ABIPrevious TBI +-------+-----------+-----------+------------+------------+ Right 1.03 0.86 1.11   +-------+-----------+-----------+------------+------------+ Left 1.06 0.84 1.27   +-------+-----------+-----------+------------+------------+ Tibial waveforms somewhat difficult to record due to irregular heartbeat. Bilateral ABIs appear essentially unchanged compared to prior study on 06/21/15. Summary: Right: Resting right ankle-brachial index is within normal range. No evidence of significant right lower extremity arterial disease. The right toe-brachial index is normal. Although ankle brachial indices are within normal limits (0.95-1.29), arterial Doppler waveforms at the ankle suggest some component of arterial occlusive disease. Left: Resting left ankle-brachial index is within normal range. No evidence of significant left lower extremity arterial disease. The left toe-brachial index is normal. Although ankle brachial indices are within normal limits (0.95-1.29), arterial Doppler  waveforms at the ankle suggest some component of arterial  occlusive disease. 12/15; the patient's area on the left mid tibia looks better. Right posterior calf also better. He has the area on the left foot as well. All of his toes look better I think this was tinea. We are using Hydrofera Blue everywhere else The patient was in urgent care yesterday with wheezing and shortness of breath. He got azithromycin and prednisone. He feels better. His lungs are currently clear to auscultation. He was not tested for Covid 19 12/29; the patient arrives in clinic today with quite a bit change. 2 weeks ago he only had areas on the left mid tibia right posterior calf with tinea pedis resolving between his toes. He arrives in clinic today with several areas on the dorsal toes on the right, dorsal left second toe. He has skin breakdown in the left medial calf probably from excess edema. Small area proximally in the medial calf. He has weeping edema fluid coming out of the skin excoriations on the left medial calf. He tells me that he is having a cardiac catheterization next week. I had a quick look at [facility] health link. He was found to have an ejection fraction of 25% during the work-up for persistent atrial fibrillation. He saw his cardiology office yesterday seen by the nurse practitioner. She increased his carvedilol. He has not been on diuretics for apparently several months and indeed in the nurse practitioner Dietrich Pates notes she had knowledge of this. 10/04/2019 on evaluation today patient presents as a walk-in visit concerning the fact that he did not have an appointment here for our clinic at this point. He actually had a cardiac catheterization earlier today and then came from there to here in order to be evaluated. With that being said unfortunately he is having significant cellulitis of his left lower extremity upon evaluation today this appears to have deteriorated even since last week's evaluation with Dr. Dellia Nims. The right lower extremity is actually doing  okay I really see no evidence of deterioration at this point at those locations. In fact the right leg seems in general be doing quite well. Nonetheless I am concerned about infection and cellulitis of the left lower extremity and again considering his weakened heart I do not want him to develop into sepsis at all. He is also having some trouble breathing today and I understand according to nursing staff this is always the case to some degree. With that being said the patient unfortunately seems to be in my opinion a little bit worse even his wife feels like that may be the case today. Unsure exactly what is leading to this. Cardiac catheterization I did review the report which showed an ejection fraction of 25% he also had an LAD blockage of around 25% based on what I saw. With that being said there was no significant blockages that required stenting at this point he does have weakened cardiac muscles compared to normal. 10/05/2019; patient was seen yesterday in clinic. He was sent to the ER because of cellulitis of the left leg possibility. In the ER he was given 1 dose of IV Levaquin and discharged on Keflex. He came in the clinic initially for a nurse visit to rewrap his left leg. We did not look at the right leg today that is an Haematologist. The patient also had a cardiac cath. According to him there were no blockages but a very low ejection fraction. 1/12; back for an early follow-up.  The condition of the left lower leg is a lot better although there are multiple open areas. All of them with not a particularly viable surface. On the right posterior calf he has a single wound with a clean surface. He has a wound with exposed bone on the PIP of the left second toe dorsally. He has wounds on the dorsal right first second and third toes. His arterial studies were normal. 1/19; the patient has 3 open wounds on the left leg anteriorly in the mid tibia, distally and medially just above the ankle and  posteriorly. On the right he has a small area dime sized on the right posterior calf. His edema control is a lot better. He still has wounds on the right second and left second toes. The left second toe has exposed bone. X-ray I did last week did not show evidence of osteomyelitis in the left foot. 1/26; patient with a multiplicity of wounds and problems. ooOn the left he has a circular area on the left anterior mid tibia ooLeft just above the medical ankle -left 2nd toe pip -right 2nd toe right posterior calf 2/2; patient with a multiplicity of wounds and problems. He has severe chronic venous insufficiency and the wounds on his leg are all on the left left anterior left medial at the medial malleolus and left posterior. We have been using Iodoflex to these areas to help with ongoing debridement. He also has largely traumatic wounds on his toes this includes the left second with exposed bone. Bone culture I did last week showed Staph aureus which is methicillin sensitive. I discussed with him today the idea of an amputation of this toe because it is literally nonfunctional however he wants to try antibiotics. Antibiotic choice is complicated by the fact that he is on Coumadin. He also has wounds over the dorsal part of the right first which is close to closed. Right second toe has exposed bone and the right third toe at the base of the right third toe is just about closed as well. We have been using silver alginate 2/9; patient with severe chronic venous insufficiency. He has large wounds on the left anterior mid tibia and on the left medial just above the ankle. I am concerned today about the depth of the area on the left mid tibia may be exposing into the muscle. He has small areas on the left posterior calf and on the right posterior calf although these do not look as threatening. He also has traumatic wounds on his toes. The right first and third are closed however the bilateral second toes  have exposed bone. The area on the left is open into the distal interphalangeal joint. In my opinion these toes are nonsalvageable and will need amputation 2/16; patient with severe chronic venous insufficiency. He has wounds on the left anterior mid tibia left medial lower leg just above the ankle. He has small areas on the left posterior calf and a more prominent area on the right posterior calf. All of these are related to poorly controlled venous hypertension. He also had traumatic wounds on the PIPs of both second toes. Unfortunately both of these have exposed bone and in the case of the left this I think goes right into the joint itself. I do not think either 1 of these is salvageable. I have reviewed his arterial evaluation that was done in July last year. He also saw Dr. [name] in June. He felt these were venous stasis. Previously had  segmental arterial pressures performed in 11/03/2014 which were normal. He was sent for for a follow-up arterial evaluation on July 1. On the right his ABIs were 1.03 with a TBI of 0.86 although his waveforms were monophasic on the left he had multiphasic waveforms at the ATA PTA but monophasic at the peroneal nevertheless his ABI was normal at 1.06 TBI also normal at 0.84. I think he has enough blood flow to heal an amputation which I think he needs of the left second toe and probably the right second toe as well 2/23; the patient is going for his bilateral second toe amputation by Dr. Amalia Hailey on Friday. In the meantime is 3 areas on the left leg and the small area on the right posterior look better. We have been using silver collagen. 3/2; the patient's toe amputations were canceled because of cardiac concerns. Apparently this surgery will need to be done in the hospital although they do not have an appointment. In the meantime he is continues to have 3 areas on the left leg one anteriorly and 2 smaller areas posteriorly on the left as well as the small posterior  area on the right. We have been using silver collagen with compression 3/9; still no word on the second toe amputations bilaterally. 3 wounds on the left leg all look better and the area on the right posterior we have been using silver collagen I change this to [facility] 3/16; amputations next Monday both second toes he is apparently going to be hospitalized. He has on his left leg including left anterior and left lateral look better very superficial also the right posterior. 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 He continues to have left leg wounds anteriorly x2 posteriorly x1 at the left lower leg just above the ankle anteriorly x1 and posteriorly on the right x1 3/30; his anterior left leg wounds are considerably smaller. In fact the only wound that is not smaller is on the right. We have been using Hydrofera Blue under Kerlix Cobax 4/6; his anterior left leg wounds continue to contract in fact the area distally and medially has closed over. He still has the area anteriorly on the left. Both posterior calf wounds on the left and right are much smaller. We saw his surgical wound at the left second toe amputation site that still sutured it looks healthy, this is being followed by his podiatrist. We went over what compression he has he has bilateral juxta lites I am hopeful the issue here was that we just did not have them tight enough because he broke down very quickly the last time we healed him out. 4/13; the patient has 3 remaining wounds one small area on the left anterior lower leg one on the left posterior and one on the right anterior. We have been using Hydrofera Blue under compression compression all of these are contracting. We did discuss life in the eventuality he will totally closed which seems likely in the next week or 2. He has bilateral juxta lite stockings. These will need  to be set at 30-40 compression. Since we last closed him over for his bilateral lower extremity wounds he is also been treated for heart failure which may help. He undoubtedly has severe chronic venous hypertension however as well 4/19; the area on the left anterior tibial area is closed. The area on the right posterior  calf is just above closed. The area on the left posterior calf has some depth. 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 Everything looks quite good here. He does not need a specific dressing 4/27; the right posterior leg wound is closed there is nothing open on the right. Still has a small punched out area on the left posterior calf. He will be transitioned into the juxta lite stocking on the right today. Still wrapping the left leg 5/4; right leg is still closed although we did not take the juxta lite stocking off. Will do this next week. Small punched out area on the left posterior calf is still not healed we have been using silver collagen under compression 5/11; right leg is still closed although he has dry flaking skin on the dorsal and plantar part of his foot. No real evidence of infection. ooThe one remaining wound on the left posterior calf. This is actually measuring somewhat larger we have been using Hydrofera Blue 02/20/20-Patient returns at 2 weeks, the wound on the posterior calf is about the same, there are 2 new areas on the anterior shin on the left both are very small and seem more like abrasion wounds, there is also a circular area on the posterior aspect of rash which is very very mild Objective Constitutional alert and oriented x 3. sitting or standing blood pressure is within target range for patient.. supine blood pressure is within target range for patient.. pulse regular and within target range  for patient.Marland Kitchen respirations regular, non-labored and within target range for patient.Marland Kitchen temperature within target range for patient.. Well- nourished and well-hydrated in no acute distress. Vitals Time Taken: 7:54 AM, Height: 74 in, Weight: 212 lbs, BMI: 27.2, Temperature: 98.2 F, Pulse: 68 bpm, Respiratory Rate: 19 breaths/min, Blood Pressure: 119/65 mmHg. General Notes: Left posterior calf ulcer circumscribed, healthy granulation and base, surrounding skin intact Circular area of rash-suggestive of ringworm-not scaly, appears to be fading, per patient no itching Integumentary (Hair, Skin) Wound #25 status is Open. Original cause of wound was Gradually Appeared. The wound is located on the Left,Distal,Posterior Lower Leg. The wound measures 0.7cm length x 0.8cm width x 0.1cm depth; 0.44cm^2 area and 0.044cm^3 volume. There is Fat Layer (Subcutaneous Tissue) Exposed exposed. There is no tunneling or undermining noted. There is a small amount of serosanguineous drainage noted. The wound margin is flat and intact. There is medium (34-66%) red granulation within the wound bed. There is a medium (34-66%) amount of necrotic tissue within the wound bed including Adherent Slough. Wound #29 status is Open. Original cause of wound was Gradually Appeared. The wound is located on the Left,Anterior Lower Leg. The wound measures 0.3cm length x 0.3cm width x 0.1cm depth; 0.071cm^2 area and 0.007cm^3 volume. There is Fat Layer (Subcutaneous Tissue) Exposed exposed. There is no tunneling or undermining noted. There is a small amount of serosanguineous drainage noted. The wound margin is distinct with the outline attached to the wound base. There is large (67-100%) pink granulation within the wound bed. There is no necrotic tissue within the wound bed. Assessment Active Problems ICD-10 Non-pressure chronic ulcer of left calf limited to breakdown of skin Chronic venous hypertension (idiopathic) with ulcer and  inflammation of bilateral lower extremity Type 2 diabetes mellitus with diabetic peripheral angiopathy without gangrene Type 2 diabetes mellitus with diabetic polyneuropathy  Procedures Wound #25 Pre-procedure diagnosis of Wound #25 is a Venous Leg Ulcer located on the Left,Distal,Posterior Lower Leg . There was a Double Layer Compression Therapy Procedure by [name], RN. Post procedure Diagnosis Wound #25: Same as Pre-Procedure Wound #29 Pre-procedure diagnosis of Wound #29 is a Venous Leg Ulcer located on the Left,Anterior Lower Leg . There was a Double Layer Compression Therapy Procedure by [name], RN. Post procedure Diagnosis Wound #29: Same as Pre-Procedure Plan Follow-up Appointments: Return Appointment in 1 week. - Tuesday Dressing Change Frequency: Wound #25 Left,Distal,Posterior Lower Leg: Do not change entire dressing for one week. Wound Cleansing: May shower with protection. Primary Wound Dressing: Wound #25 Left,Distal,Posterior Lower Leg: Polymem Silver Secondary Dressing: Dry Gauze - secure toes with tape Edema Control: Wound #25 Left,Distal,Posterior Lower Leg: Kerlix and Coban - Left Lower Extremity Support Garment 20-30 mm/Hg pressure to: - right lower leg, apply lotion daily with special attention to ankle area Off-Loading: Open toe surgical shoe to: - left and right foot 1. Continue PolyMem with 3 layer compression of the left 2. We will stop TCA for week to see if the posterior rash changes 3. Consider using Lotrimin if this turns out to be persistent Electronic Signature(s) Signed: 02/20/2020 8:15:38 AM By: Tobi Bastos MD, MBA Entered By: Tobi Bastos on 02/20/2020 08:15:38 -------------------------------------------------------------------------------- SuperBill Details Patient Name: Date of Service: Daniel Phenix D. 02/20/2020 Medical Record Number: [id number] Patient Account Number: [id number] Date of Birth/Sex: Treating  RN: Jul 01, 1941 (79 y.o. [name]) [name] Primary Care Provider: Leanord Asal, SA RA H Other Clinician: Referring Provider: Treating Provider/Extender: Glenice Laine, SA RA H Weeks in Treatment: 51 Diagnosis Coding ICD-10 Codes Code Description L97.221 Non-pressure chronic ulcer of left calf limited to breakdown of skin I87.333 Chronic venous hypertension (idiopathic) with ulcer and inflammation of bilateral lower extremity E11.51 Type 2 diabetes mellitus with diabetic peripheral angiopathy without gangrene E11.42 Type 2 diabetes mellitus with diabetic polyneuropathy Facility Procedures Physician Procedures : CPT4 Code Description Modifier [id number] - WC PHYS LEVEL 3 - EST PT ICD-10 Diagnosis Description L97.221 Non-pressure chronic ulcer of left calf limited to breakdown of skin Quantity: 1 Electronic Signature(s) Signed: 02/20/2020 8:15:51 AM By: Tobi Bastos MD, MBA Entered By: Tobi Bastos on 02/20/2020 08:15:50

## 2020-02-27 ENCOUNTER — Other Ambulatory Visit (HOSPITAL_COMMUNITY)
Admission: RE | Admit: 2020-02-27 | Discharge: 2020-02-27 | Disposition: A | Discharge status: Home / Self Care | Payer: Medicare HMO | Source: Ambulatory Visit | Attending: Internal Medicine | Admitting: Internal Medicine

## 2020-02-27 ENCOUNTER — Encounter (HOSPITAL_BASED_OUTPATIENT_CLINIC_OR_DEPARTMENT_OTHER): Payer: Medicare HMO | Attending: Internal Medicine | Admitting: Internal Medicine

## 2020-02-27 ENCOUNTER — Other Ambulatory Visit: Payer: Self-pay

## 2020-02-27 DIAGNOSIS — L97221 Non-pressure chronic ulcer of left calf limited to breakdown of skin: Secondary | ICD-10-CM | POA: Insufficient documentation

## 2020-02-27 DIAGNOSIS — Z01812 Encounter for preprocedural laboratory examination: Secondary | ICD-10-CM | POA: Insufficient documentation

## 2020-02-27 DIAGNOSIS — L03116 Cellulitis of left lower limb: Secondary | ICD-10-CM | POA: Insufficient documentation

## 2020-02-27 DIAGNOSIS — I110 Hypertensive heart disease with heart failure: Secondary | ICD-10-CM | POA: Insufficient documentation

## 2020-02-27 DIAGNOSIS — E1142 Type 2 diabetes mellitus with diabetic polyneuropathy: Secondary | ICD-10-CM | POA: Insufficient documentation

## 2020-02-27 DIAGNOSIS — I890 Lymphedema, not elsewhere classified: Secondary | ICD-10-CM | POA: Insufficient documentation

## 2020-02-27 DIAGNOSIS — I87333 Chronic venous hypertension (idiopathic) with ulcer and inflammation of bilateral lower extremity: Secondary | ICD-10-CM | POA: Diagnosis not present

## 2020-02-27 DIAGNOSIS — I872 Venous insufficiency (chronic) (peripheral): Secondary | ICD-10-CM | POA: Insufficient documentation

## 2020-02-27 DIAGNOSIS — I482 Chronic atrial fibrillation, unspecified: Secondary | ICD-10-CM | POA: Insufficient documentation

## 2020-02-27 DIAGNOSIS — Z8546 Personal history of malignant neoplasm of prostate: Secondary | ICD-10-CM | POA: Insufficient documentation

## 2020-02-27 DIAGNOSIS — E11622 Type 2 diabetes mellitus with other skin ulcer: Secondary | ICD-10-CM | POA: Insufficient documentation

## 2020-02-27 DIAGNOSIS — I87322 Chronic venous hypertension (idiopathic) with inflammation of left lower extremity: Secondary | ICD-10-CM | POA: Insufficient documentation

## 2020-02-27 DIAGNOSIS — Z7901 Long term (current) use of anticoagulants: Secondary | ICD-10-CM | POA: Insufficient documentation

## 2020-02-27 DIAGNOSIS — Z20822 Contact with and (suspected) exposure to covid-19: Secondary | ICD-10-CM | POA: Diagnosis not present

## 2020-02-27 DIAGNOSIS — E1151 Type 2 diabetes mellitus with diabetic peripheral angiopathy without gangrene: Secondary | ICD-10-CM | POA: Insufficient documentation

## 2020-02-27 DIAGNOSIS — I509 Heart failure, unspecified: Secondary | ICD-10-CM | POA: Insufficient documentation

## 2020-02-27 DIAGNOSIS — I429 Cardiomyopathy, unspecified: Secondary | ICD-10-CM | POA: Insufficient documentation

## 2020-02-27 LAB — BASIC METABOLIC PANEL
Anion gap: 9 (ref 5–15)
BUN: 21 mg/dL (ref 8–23)
CO2: 27 mmol/L (ref 22–32)
Calcium: 9.3 mg/dL (ref 8.9–10.3)
Chloride: 102 mmol/L (ref 98–111)
Creatinine, Ser: 1.09 mg/dL (ref 0.61–1.24)
GFR calc Af Amer: >60 mL/min (ref >60)
GFR calc non Af Amer: >60 mL/min (ref >60)
Glucose, Bld: 136 mg/dL — ABNORMAL HIGH (ref 70–99)
Potassium: 4.3 mmol/L (ref 3.5–5.1)
Sodium: 138 mmol/L (ref 135–145)

## 2020-02-27 LAB — CBC
HCT: 37.9 % — ABNORMAL LOW (ref 39.0–52.0)
Hemoglobin: 12.4 g/dL — ABNORMAL LOW (ref 13.0–17.0)
MCH: 34.6 pg — ABNORMAL HIGH (ref 26.0–34.0)
MCHC: 32.7 g/dL (ref 30.0–36.0)
MCV: 105.9 fL — ABNORMAL HIGH (ref 80.0–100.0)
Platelets: 179 10*3/uL (ref 150–400)
RBC: 3.58 MIL/uL — ABNORMAL LOW (ref 4.22–5.81)
RDW: 13.9 % (ref 11.5–15.5)
WBC: 5.8 10*3/uL (ref 4.0–10.5)
nRBC: 0 % (ref 0.0–0.2)

## 2020-02-28 ENCOUNTER — Telehealth: Payer: Self-pay | Admitting: Internal Medicine

## 2020-02-28 LAB — SARS CORONAVIRUS 2 (TAT 6-24 HRS): SARS Coronavirus 2: NEGATIVE

## 2020-02-28 NOTE — Telephone Encounter (Signed)
Wife states that pt has wraps to leg and wanted to know if they would be removed during the procedure. Ensured they should not be removed during this procedure.

## 2020-02-28 NOTE — Telephone Encounter (Signed)
Pt's wife is concerned about the pt's procedure tomorrow and the pre op prior-- please call [phone]

## 2020-02-28 NOTE — Progress Notes (Signed)
[name] ([id number]) , Visit Report for 02/27/2020 HPI Details Patient Name: Date of Service: [name], [name] 02/27/2020 1:00 PM Medical Record Number: [id number] Patient Account Number: [id number] Date of Birth/Sex: Treating RN: 24-Jul-1941 (79 y.o. [name]) [name] Primary Care Provider: Leanord Asal, SA RA H Other Clinician: Referring Provider: Treating Provider/Extender: Candie Mile, SA RA H Weeks in Treatment: 60 History of Present Illness Location: Patient presents with a wound to left lower leg. Quality: Patient reports No Pain. Duration: 2 months HPI Description: no cig or alcohol. spontaneous appearance in area of stasis dermamtitis. [name] on metformin only. chronic afib on Coumadin. diabetes and coag studies not good. hba1c 7.5. ivr 4.5. no pain or sxs of systemic disease. hx chf. no intermittent claudication 02/28/2019 Readmission This is a now a 79 year old man who was previously cared for in 2016 by Dr. Lindon Romp for wounds on his lower extremities. At that point he had venous reflux studies although I cannot seem to open these in [facility] link. He had arterial studies showing an ABI of 1.11 on the right and 1.27 on the left his waveforms were triphasic bilaterally. He was discharged in stockings although I do not believe he is wearing these in some time. He tells me that about a month ago he noted openings of a large wound on the posterior right calf and 2 smaller areas on the left lateral calf and a small area more recently on the left posterior calf. He has been dressing these with peroxide and triple antibiotic ointment. He is not wearing compression. Past medical history; type 2 diabetes with peripheral neuropathy, chronic venous insufficiency, hypertension, cardiomyopathy, chronic atrial fibrillation on Coumadin, prostate cancer, hyperlipidemia, gout, ABI in our clinic was 1.34 on the left and not obtainable on the right 6/9; this is a patient who has chronic  venous insufficiency. He has a fairly substantial area on the right posterior calf, left lateral calf and a small area on the left posterior calf. On arrival last week he had very palpable popliteal and femoral pulses but nothing in his bilateral feet. Unfortunately we cannot get arterial studies until July 1 at Dr. Kennon Holter office. They live in [place]. We use silver alginate under Kerlix Coban 6/16; patient with chronic venous insufficiency with wounds on his bilateral lower extremities. When he came into our clinic he was discovered to have a complete absence of peripheral pedal pulses at either the dorsalis pedis or posterior tibial. He does have easily palpable femoral and popliteal pulses. He sees Dr. [name] tomorrow for noninvasive arterial tests. He may also require venous reflux evaluation although I do not view this as an urgent thing. We have been using silver alginate. His wound surfaces of cleaned up quite nicely 6/23; patient with chronic venous insufficiency with wounds on his bilateral lower extremities. His wounds all are somewhat better looking. He did go to Dr. Kennon Holter office but somehow ended up on the doctors schedule rather than being scheduled for noninvasive tests therefore his noninvasive tests are scheduled for July 1. We agree that he has venous insufficiency ulcers but I cannot feel any pulses in his lower extremities dictating the need for test. We are only using Kerlix and light Coban unfortunately this appears to be holding the edema 6/30; has his arterial studies tomorrow. We have been using Kerlix and light Coban will go to a more aggressive compression if the arterial studies will allow. We all agreed these are venous wounds however I  cannot feel pulses at either the dorsalis pedis or posterior tibial bilaterally. His wounds generally look some better including left lateral and right posterior. 7/7-Patient returns at 1 week in Kerlix/Coban to both legs, with  improvement, in the left lateral and right posterior lower leg wounds, ABI's are normal in both legs per vascular studies, TBI is also normal on both sides, we are using hydrofera blue to the wounds 7/14; patient's arterial studies from 2 weeks ago showed an ABI on the right at 1.03 with a TBI of 0.86. On the left the ABI was 1.06 with a TBI of 0.84. Notable for the fact that his arterial waveforms were monophasic in all of the lower extremity arteries suggesting some degree of arterial occlusive disease but in general this was felt to be fairly adequate for healing. His compression was increased from 2-3 layer which is appropriate. Dressing was changed to [facility] 7/21; patient's wounds are measuring smaller. The more substantial one on the right posterior calf, second 1 on the left lateral calf. Using [facility] on both wound areas 7/28; patient continues to make nice improvements. The area on the right posterior calf is smaller. Area on the left lateral calf also is smaller. We have been using Hydrofera Blue under compression. The patient will need compression stockings and we have measured him for these in the eventuality that these heal which really should not be too long from now 8/4-Patient continues to make improvement, the right posterior calf area smaller with rim of keratotic skin on one side, the left wound is definitely smaller and improving. 8/11-Returns at 1 week, after being in 3 layer compression on both legs, both wounds appear to be improving, making good progress, patient is happy, pain is also less especially in the right leg wound 8/18; the area on the left anterior lower leg is healed. On the right posterior leg the wound remains although the dimensions are a lot better. 8/25; he arrives in clinic today with a large body of open wound on the left lateral calf. All of the 3 wounds in this area are in close juxtaposition to each other. The story is that we discharged  him last week with no a wrap on the left leg. They went to [place] could not get in as they are only excepting phone orders or online orders for stockings hence they did not put any stocking on the left leg all week. They have something at home but the patient with that was either incapable or just did not put them on. Apparently these opened 1 morning after getting out of bed. The area on the right has no real change 9/1; patient has bilateral lower extremity wounds in the setting of severe chronic venous insufficiency and secondary lymphedema. He arrived last week with new areas on the left lateral lower leg after we did not wrap him and he did not use his stockings. Nevertheless the areas on the left look better today under compression. Posterior right calf does not really changed. We are using Hydrofera Blue on both areas under compression 9/15; bilateral lower extremity wounds in the setting of severe chronic venous insufficiency and secondary lymphedema. He has 20 to 30 mmHg below-knee compression stockings under the eventuality that these close over. We did get the left leg to close but he did not transition to a stocking and this reopened. There are 2 open areas on the left posterior lateral calf and one on the right. Both of these look  satisfactory. Using [facility] 9/22; bilateral lower extremity wounds in the setting of chronic venous insufficiency. 2 superficial areas on the left lateral calf. One on the right just above the Achilles area. We have good edema control we have been using Hydrofera Blue 9/29; the areas on the left lateral calf are healed. On the right just above the Achilles and tendon area things look a lot better small wounds one scabbed area. We have been using Hydrofera Blue. We can discharge him in his own stocking on the left still wrapping on the right. This is the second time we have healed the left leg but he did not put a stocking on last time. Hopefully this  will maintain the edema from chronic venous disease with secondary lymphedema 10/6; he comes in today having a stocking on the left leg. They had trouble getting it on there is a lot of increase in swelling 2 small open areas one anteriorly and one on the medial calf. They report a lot of difficulty getting the stocking on. Paradoxically the area on the right that we have been wrapping posteriorly is closed 10/13; he comes in today with wounds bilaterally including superficial areas on the left medial and left lateral calf. As well as the right posterior has reopened in the Achilles area superiorly. He still does not have his juxta lite stockings although truthfully we would not of been able to use them today anyway. Apparently have been ordered and paid for from [employer] although they have not been delivered 10/20; his area on the right is just the boat closed on the right posterior. Still has the area on the left lateral and a very tiny area on the left medial. He has his bilateral juxta lites although he is not ready for them this week. He tolerated the increase to 4 layer compression last week quite well 10/27; the area on the right posterior calf is once again closed. He has a superficial area on the left lateral calf that is still open. He has been using Hydrofera Blue and bilateral 4-layer compression. He can change to his own juxta lite stocking on the right and we are instructing him today 11/3; the area on the right posterior calf reopened according to the patient and his wife after they took off the stocking when they got home last week. Apparently scabbed over there is now a fairly substantial wound which looks pretty much the same. Our intake nurse noted that they were using the juxta lite stockings appropriately. I was really hoping I might be able to close him out today. He has 1 very tiny remaining area on the left lateral lower leg. 11/10; right posterior calf wound measures smaller but  is still open. We have been using Hydrofera Blue. On the left he has a small oval-shaped wound and he seems to have had another wound distally that is open and likely a blister. We are using Hydrofera Blue under compression 11/17; right posterior calf wound continues to get better. We have been using Hydrofera Blue. On the left lateral one of the wounds has closed still a small open area. We have been using Hydrofera Blue on this as well. Both areas have been under 4-layer compression Arrives in clinic today with some swelling in the dorsal foot on the right some erythema of his forefoot and toes. Initially when I looked at this I almost thought this was a sunburn distal to a wrap injury. 12/1; right posterior calf wound debrided  with a curette. We have been using Hydrofera Blue on the left anterior lateral he has an area across the mid tibia. Finally a small area on the left lateral lower calf. Finally he continues to have de-epithelialized areas on the dorsal aspect of his toes. Initially thought this might be a burn injury when I saw him 2 weeks ago. I now wonder about tinea. I have also reviewed his arterial studies which were really quite good in July/20 with normal TBI's and ABIs but monophasic waveforms 12/8; comes in today with worsening problems especially on the left leg where he now has a cluster of wounds in the left anterior mid tibia. Very poor edema control. I reduced him to 3 layer from 4 layer compression last week because of some concern about blood flow to his toes however he does not have good edema control on the left leg. Right leg edema control looks satisfactory. On the left he has a cluster of wounds anteriorly, small area on the left medial fifth met head and then the collection of areas on his toes which appear better On the right he has the original area on the right posterior calf, a new area right medially. His formal arterial studies from mid July are noted below. He was  evaluated by Dr.Berry ABI Findings: +---------+------------------+-----+----------+--------+  Right  Rt Pressure (mmHg) Index Waveform  Comment   +---------+------------------+-----+----------+--------+  Brachial  176         +---------+------------------+-----+----------+--------+  ATA  176  0.99  monophasic    +---------+------------------+-----+----------+--------+  PTA  183  1.03  monophasic    +---------+------------------+-----+----------+--------+  PERO  172  0.97  monophasic    +---------+------------------+-----+----------+--------+  Great T oe 153  0.86       +---------+------------------+-----+----------+--------+ +---------+------------------+-----+-----------+-------+  Left  Lt Pressure (mmHg) Index Waveform  Comment  +---------+------------------+-----+-----------+-------+  Brachial  178         +---------+------------------+-----+-----------+-------+  ATA  162  0.91  multiphasic    +---------+------------------+-----+-----------+-------+  PTA  188  1.06  multiphasic    +---------+------------------+-----+-----------+-------+  PERO  158  0.89  monophasic     +---------+------------------+-----+-----------+-------+  Great T oe 150  0.84       +---------+------------------+-----+-----------+-------+ +-------+-----------+-----------+------------+------------+  ABI/TBI T oday's ABI T oday's TBI Previous ABI Previous TBI  +-------+-----------+-----------+------------+------------+  Right  1.03  0.86  1.11     +-------+-----------+-----------+------------+------------+  Left  1.06  0.84  1.27     +-------+-----------+-----------+------------+------------+ Tibial waveforms somewhat difficult to record due to irregular heartbeat. Bilateral ABIs appear essentially unchanged compared to prior study on 06/21/15. Summary: Right: Resting right ankle-brachial index is within normal range. No evidence of significant right lower extremity arterial disease. The right toe-brachial  index is normal. Although ankle brachial indices are within normal limits (0.95-1.29), arterial Doppler waveforms at the ankle suggest some component of arterial occlusive disease. Left: Resting left ankle-brachial index is within normal range. No evidence of significant left lower extremity arterial disease. The left toe-brachial index is normal. Although ankle brachial indices are within normal limits (0.95-1.29), arterial Doppler waveforms at the ankle suggest some component of arterial occlusive disease. 12/15; the patient's area on the left mid tibia looks better. Right posterior calf also better. He has the area on the left foot as well. All of his toes look better I think this was tinea. We are using Hydrofera Blue everywhere else The patient was in urgent care yesterday with wheezing and shortness of breath. He got azithromycin and prednisone. He feels better.  His lungs are currently clear to auscultation. He was not tested for Covid 19 12/29; the patient arrives in clinic today with quite a bit change. 2 weeks ago he only had areas on the left mid tibia right posterior calf with tinea pedis resolving between his toes. He arrives in clinic today with several areas on the dorsal toes on the right, dorsal left second toe. He has skin breakdown in the left medial calf probably from excess edema. Small area proximally in the medial calf. He has weeping edema fluid coming out of the skin excoriations on the left medial calf. He tells me that he is having a cardiac catheterization next week. I had a quick look at [facility] health link. He was found to have an ejection fraction of 25% during the work-up for persistent atrial fibrillation. He saw his cardiology office yesterday seen by the nurse practitioner. She increased his carvedilol. He has not been on diuretics for apparently several months and indeed in the nurse practitioner Dietrich Pates notes she had knowledge of this. 10/04/2019 on evaluation  today patient presents as a walk-in visit concerning the fact that he did not have an appointment here for our clinic at this point. He actually had a cardiac catheterization earlier today and then came from there to here in order to be evaluated. With that being said unfortunately he is having significant cellulitis of his left lower extremity upon evaluation today this appears to have deteriorated even since last week's evaluation with Dr. Dellia Nims. The right lower extremity is actually doing okay I really see no evidence of deterioration at this point at those locations. In fact the right leg seems in general be doing quite well. Nonetheless I am concerned about infection and cellulitis of the left lower extremity and again considering his weakened heart I do not want him to develop into sepsis at all. He is also having some trouble breathing today and I understand according to nursing staff this is always the case to some degree. With that being said the patient unfortunately seems to be in my opinion a little bit worse even his wife feels like that may be the case today. Unsure exactly what is leading to this. Cardiac catheterization I did review the report which showed an ejection fraction of 25% he also had an LAD blockage of around 25% based on what I saw. With that being said there was no significant blockages that required stenting at this point he does have weakened cardiac muscles compared to normal. 10/05/2019; patient was seen yesterday in clinic. He was sent to the ER because of cellulitis of the left leg possibility. In the ER he was given 1 dose of IV Levaquin and discharged on Keflex. He came in the clinic initially for a nurse visit to rewrap his left leg. We did not look at the right leg today that is an Haematologist. The patient also had a cardiac cath. According to him there were no blockages but a very low ejection fraction. 1/12; back for an early follow-up. The condition of the left  lower leg is a lot better although there are multiple open areas. All of them with not a particularly viable surface. On the right posterior calf he has a single wound with a clean surface. He has a wound with exposed bone on the PIP of the left second toe dorsally. He has wounds on the dorsal right first second and third toes. His arterial studies were normal. 1/19; the patient  has 3 open wounds on the left leg anteriorly in the mid tibia, distally and medially just above the ankle and posteriorly. On the right he has a small area dime sized on the right posterior calf. His edema control is a lot better. He still has wounds on the right second and left second toes. The left second toe has exposed bone. X-ray I did last week did not show evidence of osteomyelitis in the left foot. 1/26; patient with a multiplicity of wounds and problems. On the left he has a circular area on the left anterior mid tibia Left just above the medical ankle -left 2nd toe pip -right 2nd toe right posterior calf 2/2; patient with a multiplicity of wounds and problems. He has severe chronic venous insufficiency and the wounds on his leg are all on the left left anterior left medial at the medial malleolus and left posterior. We have been using Iodoflex to these areas to help with ongoing debridement. He also has largely traumatic wounds on his toes this includes the left second with exposed bone. Bone culture I did last week showed Staph aureus which is methicillin sensitive. I discussed with him today the idea of an amputation of this toe because it is literally nonfunctional however he wants to try antibiotics. Antibiotic choice is complicated by the fact that he is on Coumadin. He also has wounds over the dorsal part of the right first which is close to closed. Right second toe has exposed bone and the right third toe at the base of the right third toe is just about closed as well. We have been using silver  alginate 2/9; patient with severe chronic venous insufficiency. He has large wounds on the left anterior mid tibia and on the left medial just above the ankle. I am concerned today about the depth of the area on the left mid tibia may be exposing into the muscle. He has small areas on the left posterior calf and on the right posterior calf although these do not look as threatening. He also has traumatic wounds on his toes. The right first and third are closed however the bilateral second toes have exposed bone. The area on the left is open into the distal interphalangeal joint. In my opinion these toes are nonsalvageable and will need amputation 2/16; patient with severe chronic venous insufficiency. He has wounds on the left anterior mid tibia left medial lower leg just above the ankle. He has small areas on the left posterior calf and a more prominent area on the right posterior calf. All of these are related to poorly controlled venous hypertension. He also had traumatic wounds on the PIPs of both second toes. Unfortunately both of these have exposed bone and in the case of the left this I think goes right into the joint itself. I do not think either 1 of these is salvageable. I have reviewed his arterial evaluation that was done in July last year. He also saw Dr. [name] in June. He felt these were venous stasis. Previously had segmental arterial pressures performed in 11/03/2014 which were normal. He was sent for for a follow-up arterial evaluation on July 1. On the right his ABIs were 1.03 with a TBI of 0.86 although his waveforms were monophasic on the left he had multiphasic waveforms at the ATA PTA but monophasic at the peroneal nevertheless his ABI was normal at 1.06 TBI also normal at 0.84. I think he has enough blood flow to heal an amputation  which I think he needs of the left second toe and probably the right second toe as well 2/23; the patient is going for his bilateral second toe  amputation by Dr. Amalia Hailey on Friday. In the meantime is 3 areas on the left leg and the small area on the right posterior look better. We have been using silver collagen. 3/2; the patient's toe amputations were canceled because of cardiac concerns. Apparently this surgery will need to be done in the hospital although they do not have an appointment. In the meantime he is continues to have 3 areas on the left leg one anteriorly and 2 smaller areas posteriorly on the left as well as the small posterior area on the right. We have been using silver collagen with compression 3/9; still no word on the second toe amputations bilaterally. 3 wounds on the left leg all look better and the area on the right posterior we have been using silver collagen I change this to [facility] 3/16; amputations next Monday both second toes he is apparently going to be hospitalized. He has on his left leg including left anterior and left lateral look better very superficial also the right posterior. 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 He continues to have left leg wounds anteriorly x2 posteriorly x1 at the left lower leg just above the ankle anteriorly x1 and posteriorly on the right x1 3/30; his anterior left leg wounds are considerably smaller. In fact the only wound that is not smaller is on the right. We have been using Hydrofera Blue under Kerlix Cobax 4/6; his anterior left leg wounds continue to contract in fact the area distally and medially has closed over. He still has the area anteriorly on the left. Both posterior calf wounds on the left and right are much smaller. We saw his surgical wound at the left second toe amputation site that still sutured it looks healthy, this is being followed by his podiatrist. We went over what compression he has he has bilateral juxta lites I am hopeful the issue here was that  we just did not have them tight enough because he broke down very quickly the last time we healed him out. 4/13; the patient has 3 remaining wounds one small area on the left anterior lower leg one on the left posterior and one on the right anterior. We have been using Hydrofera Blue under compression compression all of these are contracting. We did discuss life in the eventuality he will totally closed which seems likely in the next week or 2. He has bilateral juxta lite stockings. These will need to be set at 30-40 compression. Since we last closed him over for his bilateral lower extremity wounds he is also been treated for heart failure which may help. He undoubtedly has severe chronic venous hypertension however as well 4/19; the area on the left anterior tibial area is closed. The area on the right posterior calf is just above closed. The area on the left posterior calf has some depth. 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 Everything looks quite good here. He does not need a specific dressing 4/27; the right posterior leg wound is closed there is nothing open on the right.  Still has a small punched out area on the left posterior calf. He will be transitioned into the juxta lite stocking on the right today. Still wrapping the left leg 5/4; right leg is still closed although we did not take the juxta lite stocking off. Will do this next week. Small punched out area on the left posterior calf is still not healed we have been using silver collagen under compression 5/11; right leg is still closed although he has dry flaking skin on the dorsal and plantar part of his foot. No real evidence of infection. The one remaining wound on the left posterior calf. This is actually measuring somewhat larger we have been using Hydrofera  Blue 02/20/20-Patient returns at 2 weeks, the wound on the posterior calf is about the same, there are 2 new areas on the anterior shin on the left both are very small and seem more like abrasion wounds, there is also a circular area on the posterior aspect of rash which is very very mild 6/1; the patient has a very small area on the left posterior calf and a vulnerable area on the left anterior mid tibia. The latter is epithelialized but not robust. We have been using PolyMem under compression Electronic Signature(s) Signed: 02/27/2020 5:52:05 PM By: [name] MD Entered By: [name] on 02/27/2020 13:19:59 -------------------------------------------------------------------------------- Physical Exam Details Patient Name: Date of Service: Daniel Phenix D. 02/27/2020 1:00 PM Medical Record Number: [id number] Patient Account Number: [id number] Date of Birth/Sex: Treating RN: 1941/03/08 (79 y.o. [name]) [name] Primary Care Provider: Leanord Asal, SA RA H Other Clinician: Referring Provider: Treating Provider/Extender: Candie Mile, SA RA H Weeks in Treatment: 69 Constitutional Sitting or standing Blood Pressure is within target range for patient.. Pulse regular and within target range for patient.Marland Kitchen Respirations regular, non-labored and within target range.. Temperature is normal and within the target range for the patient.Marland Kitchen Appears in no distress. Respiratory work of breathing is normal. Cardiovascular Pedal pulses palpable. Chronic venous insufficiency but the edema is well controlled. Notes Wound exam; left posterior calf ulcer is smaller only a tiny open area remains. Small area on the left anterior mid tibia is epithelialized but not does not look robust I had like to keep that protected at least for another week Electronic Signature(s) Signed: 02/27/2020 5:52:05 PM By: [name] MD Entered By: [name] on 02/27/2020  13:24:46 -------------------------------------------------------------------------------- Physician Orders Details Patient Name: Date of Service: Daniel Phenix D. 02/27/2020 1:00 PM Medical Record Number: [id number] Patient Account Number: [id number] Date of Birth/Sex: Treating RN: 11/14/1940 (79 y.o. [name]) [name] Primary Care Provider: Leanord Asal, SA RA H Other Clinician: Referring Provider: Treating Provider/Extender: Candie Mile, SA RA H Weeks in Treatment: 77 Verbal / Phone Orders: No Diagnosis Coding ICD-10 Coding Code Description L97.221 Non-pressure chronic ulcer of left calf limited to breakdown of skin I87.333 Chronic venous hypertension (idiopathic) with ulcer and inflammation of bilateral lower extremity E11.51 Type 2 diabetes mellitus with diabetic peripheral angiopathy without gangrene E11.42 Type 2 diabetes mellitus with diabetic polyneuropathy Follow-up Appointments ppointment in 1 week. - Tuesday Return A Dressing Change Frequency Wound #25 Left,Distal,Posterior Lower Leg Do not change entire dressing for one week. Wound Cleansing May shower with protection. Primary Wound Dressing Wound #25 Left,Distal,Posterior Lower Leg Polymem Silver Secondary Dressing Dry Gauze - secure toes with tape Edema Control Wound #25 Left,Distal,Posterior Lower Leg Kerlix and Coban - Left Lower Extremity Support Garment 20-30 mm/Hg pressure to: - right  lower leg, apply lotion daily with special attention to ankle area Off-Loading Open toe surgical shoe to: - left and right foot Electronic Signature(s) Signed: 02/27/2020 5:52:05 PM By: [name] MD Signed: 02/28/2020 4:36:43 PM By: [name] RN Entered By: [name] on 02/27/2020 12:59:01 -------------------------------------------------------------------------------- Problem List Details Patient Name: Date of Service: Daniel Phenix D. 02/27/2020 1:00 PM Medical Record Number: [id number] Patient Account  Number: [id number] Date of Birth/Sex: Treating RN: June 02, 1941 (79 y.o. [name]) [name] Primary Care Provider: Leanord Asal, SA RA H Other Clinician: Referring Provider: Treating Provider/Extender: Candie Mile, SA RA H Weeks in Treatment: 60 Active Problems ICD-10 Encounter Code Description Active Date MDM Diagnosis L97.221 Non-pressure chronic ulcer of left calf limited to breakdown of skin 02/28/2019 No Yes I87.333 Chronic venous hypertension (idiopathic) with ulcer and inflammation of 02/28/2019 No Yes bilateral lower extremity E11.51 Type 2 diabetes mellitus with diabetic peripheral angiopathy without gangrene 02/28/2019 No Yes E11.42 Type 2 diabetes mellitus with diabetic polyneuropathy 02/28/2019 No Yes Inactive Problems ICD-10 Code Description Active Date Inactive Date L97.211 Non-pressure chronic ulcer of right calf limited to breakdown of skin 02/28/2019 02/28/2019 B35.3 Tinea pedis 09/05/2019 09/05/2019 L97.521 Non-pressure chronic ulcer of other part of left foot limited to breakdown of skin 09/26/2019 09/26/2019 L97.524 Non-pressure chronic ulcer of other part of left foot with necrosis of bone 10/10/2019 10/10/2019 L97.511 Non-pressure chronic ulcer of other part of right foot limited to breakdown of skin 09/26/2019 09/26/2019 F12.19 Chronic systolic (congestive) heart failure 10/05/2019 10/05/2019 X58.832 Other chronic osteomyelitis, left ankle and foot 10/31/2019 10/31/2019 Resolved Problems Electronic Signature(s) Signed: 02/27/2020 5:52:05 PM By: [name] MD Entered By: [name] on 02/27/2020 13:18:17 -------------------------------------------------------------------------------- Progress Note Details Patient Name: Date of Service: Daniel Phenix D. 02/27/2020 1:00 PM Medical Record Number: [id number] Patient Account Number: [id number] Date of Birth/Sex: Treating RN: Feb 18, 1941 (79 y.o. [name]) [name] Primary Care Provider: Leanord Asal, SA RA H Other Clinician: Referring  Provider: Treating Provider/Extender: Candie Mile, SA RA H Weeks in Treatment: 52 Subjective History of Present Illness (HPI) The following HPI elements were documented for the patient's wound: Location: Patient presents with a wound to left lower leg. Quality: Patient reports No Pain. Duration: 2 months no cig or alcohol. spontaneous appearance in area of stasis dermamtitis. [name] on metformin only. chronic afib on Coumadin. diabetes and coag studies not good. hba1c 7.5. ivr 4.5. no pain or sxs of systemic disease. hx chf. no intermittent claudication 02/28/2019 Readmission This is a now a 79 year old man who was previously cared for in 2016 by Dr. Lindon Romp for wounds on his lower extremities. At that point he had venous reflux studies although I cannot seem to open these in [place] link. He had arterial studies showing an ABI of 1.11 on the right and 1.27 on the left his waveforms were triphasic bilaterally. He was discharged in stockings although I do not believe he is wearing these in some time. He tells me that about a month ago he noted openings of a large wound on the posterior right calf and 2 smaller areas on the left lateral calf and a small area more recently on the left posterior calf. He has been dressing these with peroxide and triple antibiotic ointment. He is not wearing compression. Past medical history; type 2 diabetes with peripheral neuropathy, chronic venous insufficiency, hypertension, cardiomyopathy, chronic atrial fibrillation on Coumadin, prostate cancer, hyperlipidemia, gout, ABI in our clinic was 1.34 on the left and not  obtainable on the right 6/9; this is a patient who has chronic venous insufficiency. He has a fairly substantial area on the right posterior calf, left lateral calf and a small area on the left posterior calf. On arrival last week he had very palpable popliteal and femoral pulses but nothing in his bilateral feet. Unfortunately we cannot get  arterial studies until July 1 at Dr. Kennon Holter office. They live in [place]. We use silver alginate under Kerlix Coban 6/16; patient with chronic venous insufficiency with wounds on his bilateral lower extremities. When he came into our clinic he was discovered to have a complete absence of peripheral pedal pulses at either the dorsalis pedis or posterior tibial. He does have easily palpable femoral and popliteal pulses. He sees Dr. [name] tomorrow for noninvasive arterial tests. He may also require venous reflux evaluation although I do not view this as an urgent thing. We have been using silver alginate. His wound surfaces of cleaned up quite nicely 6/23; patient with chronic venous insufficiency with wounds on his bilateral lower extremities. His wounds all are somewhat better looking. He did go to Dr. Kennon Holter office but somehow ended up on the doctors schedule rather than being scheduled for noninvasive tests therefore his noninvasive tests are scheduled for July 1. We agree that he has venous insufficiency ulcers but I cannot feel any pulses in his lower extremities dictating the need for test. We are only using Kerlix and light Coban unfortunately this appears to be holding the edema 6/30; has his arterial studies tomorrow. We have been using Kerlix and light Coban will go to a more aggressive compression if the arterial studies will allow. We all agreed these are venous wounds however I cannot feel pulses at either the dorsalis pedis or posterior tibial bilaterally. His wounds generally look some better including left lateral and right posterior. 7/7-Patient returns at 1 week in Kerlix/Coban to both legs, with improvement, in the left lateral and right posterior lower leg wounds, ABI's are normal in both legs per vascular studies, TBI is also normal on both sides, we are using hydrofera blue to the wounds 7/14; patient's arterial studies from 2 weeks ago showed an ABI on the right at 1.03 with  a TBI of 0.86. On the left the ABI was 1.06 with a TBI of 0.84. Notable for the fact that his arterial waveforms were monophasic in all of the lower extremity arteries suggesting some degree of arterial occlusive disease but in general this was felt to be fairly adequate for healing. His compression was increased from 2-3 layer which is appropriate. Dressing was changed to [facility] 7/21; patient's wounds are measuring smaller. The more substantial one on the right posterior calf, second 1 on the left lateral calf. Using [facility] on both wound areas 7/28; patient continues to make nice improvements. The area on the right posterior calf is smaller. Area on the left lateral calf also is smaller. We have been using Hydrofera Blue under compression. The patient will need compression stockings and we have measured him for these in the eventuality that these heal which really should not be too long from now 8/4-Patient continues to make improvement, the right posterior calf area smaller with rim of keratotic skin on one side, the left wound is definitely smaller and improving. 8/11-Returns at 1 week, after being in 3 layer compression on both legs, both wounds appear to be improving, making good progress, patient is happy, pain is also less especially in the right  leg wound 8/18; the area on the left anterior lower leg is healed. On the right posterior leg the wound remains although the dimensions are a lot better. 8/25; he arrives in clinic today with a large body of open wound on the left lateral calf. All of the 3 wounds in this area are in close juxtaposition to each other. The story is that we discharged him last week with no a wrap on the left leg. They went to  could not get in as they are only excepting phone orders or online orders for stockings hence they did not put any stocking on the left leg all week. They have something at home but the patient with that was  either incapable or just did not put them on. Apparently these opened 1 morning after getting out of bed. The area on the right has no real change 9/1; patient has bilateral lower extremity wounds in the setting of severe chronic venous insufficiency and secondary lymphedema. He arrived last week with new areas on the left lateral lower leg after we did not wrap him and he did not use his stockings. Nevertheless the areas on the left look better today under compression. Posterior right calf does not really changed. We are using Hydrofera Blue on both areas under compression 9/15; bilateral lower extremity wounds in the setting of severe chronic venous insufficiency and secondary lymphedema. He has 20 to 30 mmHg below-knee compression stockings under the eventuality that these close over. We did get the left leg to close but he did not transition to a stocking and this reopened. There are 2 open areas on the left posterior lateral calf and one on the right. Both of these look satisfactory. Using [facility] 9/22; bilateral lower extremity wounds in the setting of chronic venous insufficiency. 2 superficial areas on the left lateral calf. One on the right just above the Achilles area. We have good edema control we have been using Hydrofera Blue 9/29; the areas on the left lateral calf are healed. On the right just above the Achilles and tendon area things look a lot better small wounds one scabbed area. We have been using Hydrofera Blue. We can discharge him in his own stocking on the left still wrapping on the right. This is the second time we have healed the left leg but he did not put a stocking on last time. Hopefully this will maintain the edema from chronic venous disease with secondary lymphedema 10/6; he comes in today having a stocking on the left leg. They had trouble getting it on there is a lot of increase in swelling 2 small open areas one anteriorly and one on the medial calf. They  report a lot of difficulty getting the stocking on. ooParadoxically the area on the right that we have been wrapping posteriorly is closed 10/13; he comes in today with wounds bilaterally including superficial areas on the left medial and left lateral calf. As well as the right posterior has reopened in the Achilles area superiorly. He still does not have his juxta lite stockings although truthfully we would not of been able to use them today anyway. Apparently have been ordered and paid for from [employer] although they have not been delivered 10/20; his area on the right is just the boat closed on the right posterior. Still has the area on the left lateral and a very tiny area on the left medial. He has his bilateral juxta lites although he is not ready for  them this week. He tolerated the increase to 4 layer compression last week quite well 10/27; the area on the right posterior calf is once again closed. He has a superficial area on the left lateral calf that is still open. He has been using Hydrofera Blue and bilateral 4-layer compression. He can change to his own juxta lite stocking on the right and we are instructing him today 11/3; the area on the right posterior calf reopened according to the patient and his wife after they took off the stocking when they got home last week. Apparently scabbed over there is now a fairly substantial wound which looks pretty much the same. Our intake nurse noted that they were using the juxta lite stockings appropriately. I was really hoping I might be able to close him out today. He has 1 very tiny remaining area on the left lateral lower leg. 11/10; right posterior calf wound measures smaller but is still open. We have been using Hydrofera Blue. On the left he has a small oval-shaped wound and he seems to have had another wound distally that is open and likely a blister. We are using Hydrofera Blue under compression 11/17; right posterior calf wound continues to  get better. We have been using Hydrofera Blue. On the left lateral one of the wounds has closed still a small open area. We have been using Hydrofera Blue on this as well. Both areas have been under 4-layer compression Arrives in clinic today with some swelling in the dorsal foot on the right some erythema of his forefoot and toes. Initially when I looked at this I almost thought this was a sunburn distal to a wrap injury. 12/1; right posterior calf wound debrided with a curette. We have been using Hydrofera Blue on the left anterior lateral he has an area across the mid tibia. Finally a small area on the left lateral lower calf. Finally he continues to have de-epithelialized areas on the dorsal aspect of his toes. Initially thought this might be a burn injury when I saw him 2 weeks ago. I now wonder about tinea. I have also reviewed his arterial studies which were really quite good in July/20 with normal TBI's and ABIs but monophasic waveforms 12/8; comes in today with worsening problems especially on the left leg where he now has a cluster of wounds in the left anterior mid tibia. Very poor edema control. I reduced him to 3 layer from 4 layer compression last week because of some concern about blood flow to his toes however he does not have good edema control on the left leg. Right leg edema control looks satisfactory. ooOn the left he has a cluster of wounds anteriorly, small area on the left medial fifth met head and then the collection of areas on his toes which appear better ooOn the right he has the original area on the right posterior calf, a new area right medially. His formal arterial studies from mid July are noted below. He was evaluated by Dr.Berry ABI Findings: +---------+------------------+-----+----------+--------+  Right  Rt Pressure (mmHg) Index Waveform  Comment   +---------+------------------+-----+----------+--------+  Brachial  176          +---------+------------------+-----+----------+--------+  ATA  176  0.99  monophasic    +---------+------------------+-----+----------+--------+  PTA  183  1.03  monophasic    +---------+------------------+-----+----------+--------+  PERO  172  0.97  monophasic    +---------+------------------+-----+----------+--------+  Great T oe 153  0.86       +---------+------------------+-----+----------+--------+ +---------+------------------+-----+-----------+-------+  Left  Lt Pressure (mmHg) Index Waveform  Comment  +---------+------------------+-----+-----------+-------+  Brachial  178         +---------+------------------+-----+-----------+-------+  ATA  162  0.91  multiphasic    +---------+------------------+-----+-----------+-------+  PTA  188  1.06  multiphasic    +---------+------------------+-----+-----------+-------+  PERO  158  0.89  monophasic     +---------+------------------+-----+-----------+-------+  Great T oe 150  0.84       +---------+------------------+-----+-----------+-------+ +-------+-----------+-----------+------------+------------+  ABI/TBI T oday's ABI T oday's TBI Previous ABI Previous TBI  +-------+-----------+-----------+------------+------------+  Right  1.03  0.86  1.11     +-------+-----------+-----------+------------+------------+  Left  1.06  0.84  1.27     +-------+-----------+-----------+------------+------------+ Tibial waveforms somewhat difficult to record due to irregular heartbeat. Bilateral ABIs appear essentially unchanged compared to prior study on 06/21/15. Summary: Right: Resting right ankle-brachial index is within normal range. No evidence of significant right lower extremity arterial disease. The right toe-brachial index is normal. Although ankle brachial indices are within normal limits (0.95-1.29), arterial Doppler waveforms at the ankle suggest some component of arterial occlusive disease. Left: Resting left ankle-brachial index is  within normal range. No evidence of significant left lower extremity arterial disease. The left toe-brachial index is normal. Although ankle brachial indices are within normal limits (0.95-1.29), arterial Doppler waveforms at the ankle suggest some component of arterial occlusive disease. 12/15; the patient's area on the left mid tibia looks better. Right posterior calf also better. He has the area on the left foot as well. All of his toes look better I think this was tinea. We are using Hydrofera Blue everywhere else The patient was in urgent care yesterday with wheezing and shortness of breath. He got azithromycin and prednisone. He feels better. His lungs are currently clear to auscultation. He was not tested for Covid 19 12/29; the patient arrives in clinic today with quite a bit change. 2 weeks ago he only had areas on the left mid tibia right posterior calf with tinea pedis resolving between his toes. He arrives in clinic today with several areas on the dorsal toes on the right, dorsal left second toe. He has skin breakdown in the left medial calf probably from excess edema. Small area proximally in the medial calf. He has weeping edema fluid coming out of the skin excoriations on the left medial calf. He tells me that he is having a cardiac catheterization next week. I had a quick look at [facility] health link. He was found to have an ejection fraction of 25% during the work-up for persistent atrial fibrillation. He saw his cardiology office yesterday seen by the nurse practitioner. She increased his carvedilol. He has not been on diuretics for apparently several months and indeed in the nurse practitioner Dietrich Pates notes she had knowledge of this. 10/04/2019 on evaluation today patient presents as a walk-in visit concerning the fact that he did not have an appointment here for our clinic at this point. He actually had a cardiac catheterization earlier today and then came from there to here in  order to be evaluated. With that being said unfortunately he is having significant cellulitis of his left lower extremity upon evaluation today this appears to have deteriorated even since last week's evaluation with Dr. Dellia Nims. The right lower extremity is actually doing okay I really see no evidence of deterioration at this point at those locations. In fact the right leg seems in general be doing quite well. Nonetheless I am concerned about infection and cellulitis of the left lower extremity and  again considering his weakened heart I do not want him to develop into sepsis at all. He is also having some trouble breathing today and I understand according to nursing staff this is always the case to some degree. With that being said the patient unfortunately seems to be in my opinion a little bit worse even his wife feels like that may be the case today. Unsure exactly what is leading to this. Cardiac catheterization I did review the report which showed an ejection fraction of 25% he also had an LAD blockage of around 25% based on what I saw. With that being said there was no significant blockages that required stenting at this point he does have weakened cardiac muscles compared to normal. 10/05/2019; patient was seen yesterday in clinic. He was sent to the ER because of cellulitis of the left leg possibility. In the ER he was given 1 dose of IV Levaquin and discharged on Keflex. He came in the clinic initially for a nurse visit to rewrap his left leg. We did not look at the right leg today that is an Haematologist. The patient also had a cardiac cath. According to him there were no blockages but a very low ejection fraction. 1/12; back for an early follow-up. The condition of the left lower leg is a lot better although there are multiple open areas. All of them with not a particularly viable surface. On the right posterior calf he has a single wound with a clean surface. He has a wound with exposed bone on  the PIP of the left second toe dorsally. He has wounds on the dorsal right first second and third toes. His arterial studies were normal. 1/19; the patient has 3 open wounds on the left leg anteriorly in the mid tibia, distally and medially just above the ankle and posteriorly. On the right he has a small area dime sized on the right posterior calf. His edema control is a lot better. He still has wounds on the right second and left second toes. The left second toe has exposed bone. X-ray I did last week did not show evidence of osteomyelitis in the left foot. 1/26; patient with a multiplicity of wounds and problems. ooOn the left he has a circular area on the left anterior mid tibia ooLeft just above the medical ankle -left 2nd toe pip -right 2nd toe right posterior calf 2/2; patient with a multiplicity of wounds and problems. He has severe chronic venous insufficiency and the wounds on his leg are all on the left left anterior left medial at the medial malleolus and left posterior. We have been using Iodoflex to these areas to help with ongoing debridement. He also has largely traumatic wounds on his toes this includes the left second with exposed bone. Bone culture I did last week showed Staph aureus which is methicillin sensitive. I discussed with him today the idea of an amputation of this toe because it is literally nonfunctional however he wants to try antibiotics. Antibiotic choice is complicated by the fact that he is on Coumadin. He also has wounds over the dorsal part of the right first which is close to closed. Right second toe has exposed bone and the right third toe at the base of the right third toe is just about closed as well. We have been using silver alginate 2/9; patient with severe chronic venous insufficiency. He has large wounds on the left anterior mid tibia and on the left medial just above the  ankle. I am concerned today about the depth of the area on the left mid tibia  may be exposing into the muscle. He has small areas on the left posterior calf and on the right posterior calf although these do not look as threatening. He also has traumatic wounds on his toes. The right first and third are closed however the bilateral second toes have exposed bone. The area on the left is open into the distal interphalangeal joint. In my opinion these toes are nonsalvageable and will need amputation 2/16; patient with severe chronic venous insufficiency. He has wounds on the left anterior mid tibia left medial lower leg just above the ankle. He has small areas on the left posterior calf and a more prominent area on the right posterior calf. All of these are related to poorly controlled venous hypertension. He also had traumatic wounds on the PIPs of both second toes. Unfortunately both of these have exposed bone and in the case of the left this I think goes right into the joint itself. I do not think either 1 of these is salvageable. I have reviewed his arterial evaluation that was done in July last year. He also saw Dr. [name] in June. He felt these were venous stasis. Previously had segmental arterial pressures performed in 11/03/2014 which were normal. He was sent for for a follow-up arterial evaluation on July 1. On the right his ABIs were 1.03 with a TBI of 0.86 although his waveforms were monophasic on the left he had multiphasic waveforms at the ATA PTA but monophasic at the peroneal nevertheless his ABI was normal at 1.06 TBI also normal at 0.84. I think he has enough blood flow to heal an amputation which I think he needs of the left second toe and probably the right second toe as well 2/23; the patient is going for his bilateral second toe amputation by Dr. Amalia Hailey on Friday. In the meantime is 3 areas on the left leg and the small area on the right posterior look better. We have been using silver collagen. 3/2; the patient's toe amputations were canceled because of cardiac  concerns. Apparently this surgery will need to be done in the hospital although they do not have an appointment. In the meantime he is continues to have 3 areas on the left leg one anteriorly and 2 smaller areas posteriorly on the left as well as the small posterior area on the right. We have been using silver collagen with compression 3/9; still no word on the second toe amputations bilaterally. 3 wounds on the left leg all look better and the area on the right posterior we have been using silver collagen I change this to [facility] 3/16; amputations next Monday both second toes he is apparently going to be hospitalized. He has on his left leg including left anterior and left lateral look better very superficial also the right posterior. 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 He continues to have left leg wounds anteriorly x2 posteriorly x1 at the left lower leg just above the ankle anteriorly x1 and posteriorly on the right x1 3/30; his anterior left leg wounds are considerably smaller. In fact the only wound that is not smaller is on the right. We have been using Hydrofera Blue under Kerlix Cobax 4/6; his anterior left leg wounds continue to contract in fact the area distally and medially  has closed over. He still has the area anteriorly on the left. Both posterior calf wounds on the left and right are much smaller. We saw his surgical wound at the left second toe amputation site that still sutured it looks healthy, this is being followed by his podiatrist. We went over what compression he has he has bilateral juxta lites I am hopeful the issue here was that we just did not have them tight enough because he broke down very quickly the last time we healed him out. 4/13; the patient has 3 remaining wounds one small area on the left anterior lower leg one on the left posterior and one on the  right anterior. We have been using Hydrofera Blue under compression compression all of these are contracting. We did discuss life in the eventuality he will totally closed which seems likely in the next week or 2. He has bilateral juxta lite stockings. These will need to be set at 30-40 compression. Since we last closed him over for his bilateral lower extremity wounds he is also been treated for heart failure which may help. He undoubtedly has severe chronic venous hypertension however as well 4/19; the area on the left anterior tibial area is closed. The area on the right posterior calf is just above closed. The area on the left posterior calf has some depth. 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 Everything looks quite good here. He does not need a specific dressing 4/27; the right posterior leg wound is closed there is nothing open on the right. Still has a small punched out area on the left posterior calf. He will be transitioned into the juxta lite stocking on the right today. Still wrapping the left leg 5/4; right leg is still closed although we did not take the juxta lite stocking off. Will do this next week. Small punched out area on the left posterior calf is still not healed we have been using silver collagen under compression 5/11; right leg is still closed although he has dry flaking skin on the dorsal and plantar part of his foot. No real evidence of infection. ooThe one remaining wound on the left posterior calf. This is actually measuring somewhat larger we have been using Hydrofera Blue 02/20/20-Patient returns at 2 weeks, the wound on the posterior calf is about the same, there are 2 new areas on the anterior shin on the left both are very small and seem more like abrasion wounds, there is also a circular area on the  posterior aspect of rash which is very very mild 6/1; the patient has a very small area on the left posterior calf and a vulnerable area on the left anterior mid tibia. The latter is epithelialized but not robust. We have been using PolyMem under compression Objective Constitutional Sitting or standing Blood Pressure is within target range for patient.. Pulse regular and within target range for patient.Marland Kitchen Respirations regular, non-labored and within target range.. Temperature is normal and within the target range for the patient.Marland Kitchen Appears in no distress. Vitals Time Taken: 1:00 PM, Height: 74 in, Source: Stated, Weight: 212 lbs, Source: Stated, BMI: 27.2, Temperature: 97.7 F, Pulse: 66 bpm, Respiratory Rate: 18 breaths/min, Blood Pressure: 109/72 mmHg. Respiratory work of breathing is normal. Cardiovascular Pedal pulses palpable. Chronic venous insufficiency but the edema is well controlled. General Notes:  Wound exam; left posterior calf ulcer is smaller only a tiny open area remains. Small area on the left anterior mid tibia is epithelialized but not does not look robust I had like to keep that protected at least for another week Integumentary (Hair, Skin) Wound #25 status is Open. Original cause of wound was Gradually Appeared. The wound is located on the Left,Distal,Posterior Lower Leg. The wound measures 0.8cm length x 0.5cm width x 0.1cm depth; 0.314cm^2 area and 0.031cm^3 volume. There is Fat Layer (Subcutaneous Tissue) Exposed exposed. There is no tunneling or undermining noted. There is a small amount of serosanguineous drainage noted. The wound margin is flat and intact. There is large (67- 100%) red granulation within the wound bed. There is a small (1-33%) amount of necrotic tissue within the wound bed including Adherent Slough. Wound #29 status is Healed - Epithelialized. Original cause of wound was Gradually Appeared. The wound is located on the Left,Anterior Lower Leg. The  wound measures 0cm length x 0cm width x 0cm depth; 0cm^2 area and 0cm^3 volume. There is no tunneling or undermining noted. There is a none present amount of drainage noted. The wound margin is distinct with the outline attached to the wound base. There is no granulation within the wound bed. There is no necrotic tissue within the wound bed. Assessment Active Problems ICD-10 Non-pressure chronic ulcer of left calf limited to breakdown of skin Chronic venous hypertension (idiopathic) with ulcer and inflammation of bilateral lower extremity Type 2 diabetes mellitus with diabetic peripheral angiopathy without gangrene Type 2 diabetes mellitus with diabetic polyneuropathy Plan Follow-up Appointments: Return Appointment in 1 week. - Tuesday Dressing Change Frequency: Wound #25 Left,Distal,Posterior Lower Leg: Do not change entire dressing for one week. Wound Cleansing: May shower with protection. Primary Wound Dressing: Wound #25 Left,Distal,Posterior Lower Leg: Polymem Silver Secondary Dressing: Dry Gauze - secure toes with tape Edema Control: Wound #25 Left,Distal,Posterior Lower Leg: Kerlix and Coban - Left Lower Extremity Support Garment 20-30 mm/Hg pressure to: - right lower leg, apply lotion daily with special attention to ankle area Off-Loading: Open toe surgical shoe to: - left and right foot 1. I continued with the polymen to the left posterior calf and the left anterior tibia a border foam. 2. Hopefully both of these will be closed soon. 3. He is wearing a juxta lite stocking on the right leg which has maintained wound closure Electronic Signature(s) Signed: 02/27/2020 5:52:05 PM By: [name] MD Entered By: [name] on 02/27/2020 13:25:37 -------------------------------------------------------------------------------- SuperBill Details Patient Name: Date of Service: Daniel Phenix D. 02/27/2020 Medical Record Number: [id number] Patient Account Number:  [id number] Date of Birth/Sex: Treating RN: 10/17/1940 (79 y.o. [name]) [name] Primary Care Provider: Leanord Asal, SA RA H Other Clinician: Referring Provider: Treating Provider/Extender: Candie Mile, SA RA H Weeks in Treatment: 52 Diagnosis Coding ICD-10 Codes Code Description [phone] Non-pressure chronic ulcer of left calf limited to breakdown of skin I87.333 Chronic venous hypertension (idiopathic) with ulcer and inflammation of bilateral lower extremity E11.51 Type 2 diabetes mellitus with diabetic peripheral angiopathy without gangrene E11.42 Type 2 diabetes mellitus with diabetic polyneuropathy Physician Procedures Electronic Signature(s) Signed: 02/27/2020 5:52:05 PM By: [name] MD Entered By: [name] on 02/27/2020 13:25:54

## 2020-02-28 NOTE — Progress Notes (Signed)
[name] ([id number]) , Visit Report for 02/27/2020 Arrival Information Details Patient Name: Date of Service: [name], [name] 02/27/2020 1:00 PM Medical Record Number: [id number] Patient Account Number: [id number] Date of Birth/Sex: Treating RN: February 26, 1941 (79 y.o. [name] Primary Care Lason Eveland: Leanord Asal, SA RA H Other Clinician: Referring Filiberto Wamble: Treating Vicke Plotner/Extender: Candie Mile, SA RA H Weeks in Treatment: 57 Visit Information History Since Last Visit Added or deleted any medications: No Patient Arrived: Wheel Chair Any new allergies or adverse reactions: No Arrival Time: 13:01 Had a fall or experienced change in No Accompanied By: spouse activities of daily living that may affect Transfer Assistance: None risk of falls: Patient Identification Verified: Yes Signs or symptoms of abuse/neglect since last visito No Secondary Verification Process Completed: Yes Hospitalized since last visit: No Patient Requires Transmission-Based Precautions: No Implantable device outside of the clinic excluding No Patient Has Alerts: Yes cellular tissue based products placed in the center Patient Alerts: Patient on Blood Thinner since last visit: Has Dressing in Place as Prescribed: Yes Has Compression in Place as Prescribed: Yes Pain Present Now: No Electronic Signature(s) Signed: 02/27/2020 5:54:40 PM By: Baruch Gouty RN, BSN Entered By: Baruch Gouty on 02/27/2020 13:01:47 -------------------------------------------------------------------------------- Encounter Discharge Information Details Patient Name: Date of Service: Daniel Phenix D. 02/27/2020 1:00 PM Medical Record Number: [id number] Patient Account Number: [id number] Date of Birth/Sex: Treating RN: 09-Dec-1940 (79 y.o. [name] Primary Care Rane Dumm: Leanord Asal, SA RA H Other Clinician: Referring Marquail Bradwell: Treating Marcelene Weidemann/Extender: Candie Mile, SA RA H Weeks in Treatment:  65 Encounter Discharge Information Items Discharge Condition: Stable Ambulatory Status: Wheelchair Discharge Destination: Home Transportation: Private Auto Accompanied By: wife Schedule Follow-up Appointment: Yes Clinical Summary of Care: Patient Declined Electronic Signature(s) Signed: 02/28/2020 7:13:26 AM By: Kela Millin Entered By: Kela Millin on 02/27/2020 14:01:11 -------------------------------------------------------------------------------- Lower Extremity Assessment Details Patient Name: Date of Service: [name], [name] 02/27/2020 1:00 PM Medical Record Number: [id number] Patient Account Number: [id number] Date of Birth/Sex: Treating RN: September 28, 1941 (79 y.o. [name] Primary Care Taeshawn Helfman: Leanord Asal, SA RA H Other Clinician: Referring Caspar Favila: Treating Arjen Deringer/Extender: Candie Mile, SA RA H Weeks in Treatment: 52 Edema Assessment Assessed: [Left: No] [Right: No] Edema: [Left: N] [Right: o] Calf Left: Right: Point of Measurement: 31 cm From Medial Instep 33 cm cm Ankle Left: Right: Point of Measurement: 11 cm From Medial Instep 21 cm cm Vascular Assessment Pulses: Dorsalis Pedis Palpable: [Left:No] Electronic Signature(s) Signed: 02/27/2020 5:54:40 PM By: Baruch Gouty RN, BSN Entered By: Baruch Gouty on 02/27/2020 13:03:04 -------------------------------------------------------------------------------- Multi Wound Chart Details Patient Name: Date of Service: Daniel Phenix D. 02/27/2020 1:00 PM Medical Record Number: [id number] Patient Account Number: [id number] Date of Birth/Sex: Treating RN: [birth year] (78 y.o. [name]) [name] Primary Care Theadore Blunck: Leanord Asal, SA RA H Other Clinician: Referring Devoiry Corriher: Treating Dustie Brittle/Extender: Candie Mile, SA RA H Weeks in Treatment: 52 Vital Signs Height(in): 61 Pulse(bpm): 47 Weight(lbs): 212 Blood Pressure(mmHg): 109/72 Body Mass Index(BMI): 27 Temperature(F):  97.7 Respiratory Rate(breaths/min): 18 Photos: [25:No Photos Left, Distal, Posterior Lower Leg] [29:No Photos Left, Anterior Lower Leg] [N/A:N/A N/A] Wound Location: [25:Gradually Appeared] [29:Gradually Appeared] [N/A:N/A] Wounding Event: [25:Venous Leg Ulcer] [29:Venous Leg Ulcer] [N/A:N/A] Primary Etiology: [25:Diabetic Wound/Ulcer of the Lower] [29:N/A] [N/A:N/A] Secondary Etiology: [25:Extremity Cataracts, Hypertension, Peripheral] [29:Cataracts, Hypertension, Peripheral] [N/A:N/A] Comorbid History: [25:Venous Disease, Type II Diabetes, Gout, Received Radiation 10/04/2019] [29:Venous Disease, Type II Diabetes, Gout, Received Radiation  02/20/2020] [N/A:N/A] Date Acquired: [25:20] [29:1] [N/A:N/A] Weeks of Treatment: [25:Open] [29:Healed - Epithelialized] [N/A:N/A] Wound Status: [25:Yes] [29:No] [N/A:N/A] Clustered Wound: [25:1] [29:N/A] [N/A:N/A] Clustered Quantity: [25:0.8x0.5x0.1] [29:0x0x0] [N/A:N/A] Measurements L x W x D (cm) [88:8.280] [29:0] [N/A:N/A] A (cm) : rea [03:4.917] [29:0] [N/A:N/A] Volume (cm) : [25:98.70%] [29:100.00%] [N/A:N/A] % Reduction in Area: [25:98.70%] [29:100.00%] [N/A:N/A] % Reduction in Volume: [25:Full Thickness Without Exposed] [29:Full Thickness Without Exposed] [N/A:N/A] Classification: [25:Support Structures Small] [29:Support Structures None Present] [N/A:N/A] Exudate Amount: [25:Serosanguineous] [29:N/A] [N/A:N/A] Exudate Type: [25:red, brown] [29:N/A] [N/A:N/A] Exudate Color: [25:Flat and Intact] [29:Distinct, outline attached] [N/A:N/A] Wound Margin: [25:Large (67-100%)] [29:None Present (0%)] [N/A:N/A] Granulation Amount: [25:Red] [29:N/A] [N/A:N/A] Granulation Quality: [25:Small (1-33%)] [29:None Present (0%)] [N/A:N/A] Necrotic Amount: [25:Fat Layer (Subcutaneous Tissue)] [29:Fascia: No] [N/A:N/A] Exposed Structures: [25:Exposed: Yes Fascia: No Tendon: No Muscle: No Joint: No Bone: No Small (1-33%)] [29:Fat Layer (Subcutaneous Tissue) Exposed:  No Tendon: No Muscle: No Joint: No Bone: No Large (67-100%)] [N/A:N/A] Treatment Notes Electronic Signature(s) Signed: 02/27/2020 5:52:05 PM By: [name] MD Signed: 02/28/2020 4:36:43 PM By: [name] RN Entered By: [name] on 02/27/2020 13:19:08 -------------------------------------------------------------------------------- Multi-Disciplinary Care Plan Details Patient Name: Date of Service: Daniel Phenix D. 02/27/2020 1:00 PM Medical Record Number: [id number] Patient Account Number: [id number] Date of Birth/Sex: Treating RN: 04-07-1941 (78 y.o. [name] Primary Care Glen Kesinger: Leanord Asal, SA RA H Other Clinician: Referring Marquesha Robideau: Treating Nasteho Glantz/Extender: Candie Mile, SA RA H Weeks in Treatment: 5 Active Inactive Wound/Skin Impairment Nursing Diagnoses: Knowledge deficit related to ulceration/compromised skin integrity Goals: Patient/caregiver will verbalize understanding of skin care regimen Date Initiated: 02/28/2019 Target Resolution Date: 03/01/2020 Goal Status: Active Ulcer/skin breakdown will have a volume reduction of 30% by week 4 Date Initiated: 02/28/2019 Date Inactivated: 04/04/2019 Target Resolution Date: 03/31/2019 Goal Status: Met Ulcer/skin breakdown will have a volume reduction of 50% by week 8 Date Initiated: 04/04/2019 Date Inactivated: 05/09/2019 Target Resolution Date: 05/05/2019 Goal Status: Met Ulcer/skin breakdown will have a volume reduction of 80% by week 12 Date Initiated: 05/09/2019 Date Inactivated: 06/13/2019 Target Resolution Date: 06/09/2019 Unmet Reason: comorbities/new Goal Status: Unmet wounds Ulcer/skin breakdown will heal within 14 weeks Date Initiated: 06/13/2019 Date Inactivated: 07/11/2019 Target Resolution Date: 07/07/2019 Goal Status: Unmet Unmet Reason: comorbityies Interventions: Assess patient/caregiver ability to obtain necessary supplies Assess patient/caregiver ability to perform ulcer/skin care regimen  upon admission and as needed Assess ulceration(s) every visit Notes: Electronic Signature(s) Signed: 02/28/2020 4:36:43 PM By: [name] RN Entered By: [name] on 02/27/2020 12:59:32 -------------------------------------------------------------------------------- Pain Assessment Details Patient Name: Date of Service: HAZE, ANTILLON D. 02/27/2020 1:00 PM Medical Record Number: [id number] Patient Account Number: [id number] Date of Birth/Sex: Treating RN: [birth year] (79 y.o. [name] Primary Care Tatayana Beshears: Leanord Asal, [place] RA H Other Clinician: Referring Sherrita Riederer: Treating Elizeo Rodriques/Extender: Candie Mile, SA RA H Weeks in Treatment: 32 Active Problems Location of Pain Severity and Description of Pain Patient Has Paino No Site Locations Rate the pain. Current Pain Level: 0 Pain Management and Medication Current Pain Management: Electronic Signature(s) Signed: 02/27/2020 5:54:40 PM By: Baruch Gouty RN, BSN Entered By: Baruch Gouty on 02/27/2020 13:02:26 -------------------------------------------------------------------------------- Patient/Caregiver Education Details Patient Name: Date of Service: Lattie Corns 6/1/2021andnbsp1:00 PM Medical Record Number: [id number] Patient Account Number: [id number] Date of Birth/Gender: Treating RN: 07/28/1941 (78 y.o. [name]) [name] Primary Care Physician: Leanord Asal, SA RA H Other Clinician: Referring Physician: Treating Physician/Extender: Candie Mile, SA RA H Weeks in  Treatment: 52 Education Assessment Education Provided To: Patient Education Topics Provided Wound/Skin Impairment: Methods: Explain/Verbal Responses: State content correctly Electronic Signature(s) Signed: 02/28/2020 4:36:43 PM By: [name] RN Entered By: [name] on 02/27/2020 13:00:39 -------------------------------------------------------------------------------- Wound Assessment Details Patient Name: Date of  Service: RYKAR, LEBLEU D. 02/27/2020 1:00 PM Medical Record Number: [id number] Patient Account Number: [id number] Date of Birth/Sex: Treating RN: [birth year] (79 y.o. [name] Primary Care Ova Gillentine: Leanord Asal, [place] RA H Other Clinician: Referring Keenan Dimitrov: Treating Baila Rouse/Extender: Candie Mile, SA RA H Weeks in Treatment: 52 Wound Status Wound Number: 25 Primary Venous Leg Ulcer Etiology: Wound Location: Left, Distal, Posterior Lower Leg Secondary Diabetic Wound/Ulcer of the Lower Extremity Wounding Event: Gradually Appeared Etiology: Date Acquired: 10/04/2019 Wound Open Weeks Of Treatment: 20 Status: Clustered Wound: Yes Comorbid Cataracts, Hypertension, Peripheral Venous Disease, Type II History: Diabetes, Gout, Received Radiation Photos Photo Uploaded By: Mikeal Hawthorne on 02/28/2020 08:41:31 Wound Measurements Length: (cm) 0.8 Width: (cm) 0.5 Depth: (cm) 0.1 Clustered Quantity: 1 Area: (cm) 0.314 Volume: (cm) 0.031 % Reduction in Area: 98.7% % Reduction in Volume: 98.7% Epithelialization: Small (1-33%) Tunneling: No Undermining: No Wound Description Classification: Full Thickness Without Exposed Support Structures Wound Margin: Flat and Intact Exudate Amount: Small Exudate Type: Serosanguineous Exudate Color: red, brown Foul Odor After Cleansing: No Slough/Fibrino Yes Wound Bed Granulation Amount: Large (67-100%) Exposed Structure Granulation Quality: Red Fascia Exposed: No Necrotic Amount: Small (1-33%) Fat Layer (Subcutaneous Tissue) Exposed: Yes Necrotic Quality: Adherent Slough Tendon Exposed: No Muscle Exposed: No Joint Exposed: No Bone Exposed: No Treatment Notes Wound #25 (Left, Distal, Posterior Lower Leg) 1. Cleanse With Wound Cleanser Soap and water 2. Periwound Care Moisturizing lotion 3. Primary Dressing Applied Polymem Ag 4. Secondary Dressing Dry Gauze 6. Support Layer  Applied Kerlix/Coban Notes Horticulturist, commercial) Signed: 02/27/2020 5:54:40 PM By: Baruch Gouty RN, BSN Entered By: Baruch Gouty on 02/27/2020 13:05:10 -------------------------------------------------------------------------------- Wound Assessment Details Patient Name: Date of Service: MICAHEL, OMLOR D. 02/27/2020 1:00 PM Medical Record Number: [id number] Patient Account Number: [id number] Date of Birth/Sex: Treating RN: Nov 16, 1940 (79 y.o. [name] Primary Care Ivanna Kocak: Leanord Asal, [place] RA H Other Clinician: Referring Svea Pusch: Treating Erie Radu/Extender: Candie Mile, SA RA H Weeks in Treatment: 52 Wound Status Wound Number: 29 Primary Venous Leg Ulcer Etiology: Wound Location: Left, Anterior Lower Leg Wound Healed - Epithelialized Wounding Event: Gradually Appeared Status: Date Acquired: 02/20/2020 Comorbid Cataracts, Hypertension, Peripheral Venous Disease, Type II Weeks Of Treatment: 1 History: Diabetes, Gout, Received Radiation Clustered Wound: No Photos Photo Uploaded By: Mikeal Hawthorne on 02/28/2020 08:41:32 Wound Measurements Length: (cm) Width: (cm) Depth: (cm) Area: (cm) Volume: (cm) 0 % Reduction in Area: 100% 0 % Reduction in Volume: 100% 0 Epithelialization: Large (67-100%) 0 Tunneling: No 0 Undermining: No Wound Description Classification: Full Thickness Without Exposed Support Structures Wound Margin: Distinct, outline attached Exudate Amount: None Present Foul Odor After Cleansing: No Slough/Fibrino No Wound Bed Granulation Amount: None Present (0%) Exposed Structure Necrotic Amount: None Present (0%) Fascia Exposed: No Fat Layer (Subcutaneous Tissue) Exposed: No Tendon Exposed: No Muscle Exposed: No Joint Exposed: No Bone Exposed: No Electronic Signature(s) Signed: 02/27/2020 5:54:40 PM By: Baruch Gouty RN, BSN Entered By: Baruch Gouty on 02/27/2020  13:05:31 -------------------------------------------------------------------------------- [place] Details Patient Name: Date of Service: Daniel Phenix D. 02/27/2020 1:00 PM Medical Record Number: [id number] Patient Account Number: [id number] Date of Birth/Sex: Treating RN: 05/07/1941 (79 y.o. [name] Primary Care Shaman Muscarella: Leanord Asal,  SA RA H Other Clinician: Referring Marquavius Scaife: Treating Keonia Pasko/Extender: Candie Mile, SA RA H Weeks in Treatment: 42 Vital Signs Time Taken: 13:00 Temperature (F): 97.7 Height (in): 74 Pulse (bpm): 66 Source: Stated Respiratory Rate (breaths/min): 18 Weight (lbs): 212 Blood Pressure (mmHg): 109/72 Source: Stated Reference Range: 80 - 120 mg / dl Body Mass Index (BMI): 27.2 Electronic Signature(s) Signed: 02/27/2020 5:54:40 PM By: Baruch Gouty RN, BSN Entered By: Baruch Gouty on 02/27/2020 13:02:17

## 2020-02-28 NOTE — Telephone Encounter (Signed)
The patient and his wife are concerned regarding the wounds on his legs interfering with the implantable device appointment tomorrow because they were recently wrapped by the wound center. Dr. Lovena Le states that he is aware that the patient has wounds on his legs and they will not need to be unwrapped for the procedure tomorrow. Patient made aware.

## 2020-02-29 ENCOUNTER — Ambulatory Visit (HOSPITAL_COMMUNITY)
Admission: RE | Admit: 2020-02-29 | Discharge: 2020-02-29 | Disposition: A | Discharge status: Home / Self Care | Payer: Medicare HMO | Attending: Internal Medicine | Admitting: Internal Medicine

## 2020-02-29 ENCOUNTER — Ambulatory Visit (HOSPITAL_COMMUNITY): Payer: Medicare HMO

## 2020-02-29 ENCOUNTER — Ambulatory Visit (HOSPITAL_COMMUNITY)
Admission: RE | Disposition: A | Discharge status: Home / Self Care | Payer: Self-pay | Source: Home / Self Care | Attending: Internal Medicine

## 2020-02-29 ENCOUNTER — Other Ambulatory Visit: Payer: Self-pay

## 2020-02-29 DIAGNOSIS — Z7984 Long term (current) use of oral hypoglycemic drugs: Secondary | ICD-10-CM | POA: Diagnosis not present

## 2020-02-29 DIAGNOSIS — I517 Cardiomegaly: Secondary | ICD-10-CM | POA: Diagnosis not present

## 2020-02-29 DIAGNOSIS — Z79899 Other long term (current) drug therapy: Secondary | ICD-10-CM | POA: Insufficient documentation

## 2020-02-29 DIAGNOSIS — E119 Type 2 diabetes mellitus without complications: Secondary | ICD-10-CM | POA: Insufficient documentation

## 2020-02-29 DIAGNOSIS — Z95810 Presence of automatic (implantable) cardiac defibrillator: Secondary | ICD-10-CM

## 2020-02-29 DIAGNOSIS — I509 Heart failure, unspecified: Secondary | ICD-10-CM | POA: Diagnosis present

## 2020-02-29 DIAGNOSIS — I5022 Chronic systolic (congestive) heart failure: Secondary | ICD-10-CM | POA: Diagnosis not present

## 2020-02-29 DIAGNOSIS — I447 Left bundle-branch block, unspecified: Secondary | ICD-10-CM | POA: Insufficient documentation

## 2020-02-29 DIAGNOSIS — Z888 Allergy status to other drugs, medicaments and biological substances status: Secondary | ICD-10-CM | POA: Insufficient documentation

## 2020-02-29 DIAGNOSIS — M109 Gout, unspecified: Secondary | ICD-10-CM | POA: Diagnosis not present

## 2020-02-29 DIAGNOSIS — Z8546 Personal history of malignant neoplasm of prostate: Secondary | ICD-10-CM | POA: Insufficient documentation

## 2020-02-29 DIAGNOSIS — Z7901 Long term (current) use of anticoagulants: Secondary | ICD-10-CM | POA: Insufficient documentation

## 2020-02-29 DIAGNOSIS — I252 Old myocardial infarction: Secondary | ICD-10-CM | POA: Diagnosis not present

## 2020-02-29 DIAGNOSIS — I4892 Unspecified atrial flutter: Secondary | ICD-10-CM | POA: Insufficient documentation

## 2020-02-29 DIAGNOSIS — E559 Vitamin D deficiency, unspecified: Secondary | ICD-10-CM | POA: Insufficient documentation

## 2020-02-29 DIAGNOSIS — Z881 Allergy status to other antibiotic agents status: Secondary | ICD-10-CM | POA: Diagnosis not present

## 2020-02-29 DIAGNOSIS — I255 Ischemic cardiomyopathy: Secondary | ICD-10-CM | POA: Insufficient documentation

## 2020-02-29 DIAGNOSIS — E785 Hyperlipidemia, unspecified: Secondary | ICD-10-CM | POA: Insufficient documentation

## 2020-02-29 DIAGNOSIS — I110 Hypertensive heart disease with heart failure: Secondary | ICD-10-CM | POA: Diagnosis not present

## 2020-02-29 DIAGNOSIS — I4821 Permanent atrial fibrillation: Secondary | ICD-10-CM | POA: Diagnosis not present

## 2020-02-29 DIAGNOSIS — I5042 Chronic combined systolic (congestive) and diastolic (congestive) heart failure: Secondary | ICD-10-CM | POA: Diagnosis not present

## 2020-02-29 HISTORY — PX: BIV ICD INSERTION CRT-D: EP1195

## 2020-02-29 LAB — PROTIME-INR
INR: 1.9 — ABNORMAL HIGH (ref 0.8–1.2)
Prothrombin Time: 20.9 seconds — ABNORMAL HIGH (ref 11.4–15.2)

## 2020-02-29 LAB — GLUCOSE, CAPILLARY
Glucose-Capillary: 122 mg/dL — ABNORMAL HIGH (ref 70–99)
Glucose-Capillary: 104 mg/dL — ABNORMAL HIGH (ref 70–99)

## 2020-02-29 SURGERY — BIV ICD INSERTION CRT-D

## 2020-02-29 MED ORDER — LIDOCAINE HCL 1 % IJ SOLN
INTRAMUSCULAR | Status: AC
  Filled 2020-02-29: qty 20

## 2020-02-29 MED ORDER — MIDAZOLAM HCL 5 MG/5ML IJ SOLN
INTRAMUSCULAR | Status: AC
  Filled 2020-02-29: qty 5

## 2020-02-29 MED ORDER — ONDANSETRON HCL 4 MG/2ML IJ SOLN: 4.0000 mg | Freq: Four times a day (QID) | INTRAMUSCULAR | Status: DC | PRN

## 2020-02-29 MED ORDER — HEPARIN (PORCINE) IN NACL 1000-0.9 UT/500ML-% IV SOLN
INTRAVENOUS | Status: AC
  Filled 2020-02-29: qty 500

## 2020-02-29 MED ORDER — IOHEXOL 350 MG/ML SOLN
INTRAVENOUS | Status: DC | PRN
  Administered 2020-02-29: 25 mL

## 2020-02-29 MED ORDER — SODIUM CHLORIDE 0.9 % IV SOLN: INTRAVENOUS | Status: DC

## 2020-02-29 MED ORDER — HEPARIN (PORCINE) IN NACL 1000-0.9 UT/500ML-% IV SOLN
INTRAVENOUS | Status: DC | PRN
  Administered 2020-02-29: 500 mL

## 2020-02-29 MED ORDER — SODIUM CHLORIDE 0.9 % IV SOLN
INTRAVENOUS | Status: AC
  Filled 2020-02-29: qty 2

## 2020-02-29 MED ORDER — ACETAMINOPHEN 325 MG PO TABS
325.0000 mg | ORAL_TABLET | ORAL | Status: DC | PRN
  Filled 2020-02-29: qty 2

## 2020-02-29 MED ORDER — FENTANYL CITRATE (PF) 100 MCG/2ML IJ SOLN
INTRAMUSCULAR | Status: AC
  Filled 2020-02-29: qty 2

## 2020-02-29 MED ORDER — CEFAZOLIN SODIUM-DEXTROSE 1-4 GM/50ML-% IV SOLN
1.0000 g | Freq: Four times a day (QID) | INTRAVENOUS | Status: AC
  Administered 2020-02-29: 1 g via INTRAVENOUS

## 2020-02-29 MED ORDER — METOPROLOL TARTRATE 5 MG/5ML IV SOLN
INTRAVENOUS | Status: DC | PRN
  Administered 2020-02-29: 3 mg via INTRAVENOUS

## 2020-02-29 MED ORDER — CEFAZOLIN SODIUM-DEXTROSE 2-4 GM/100ML-% IV SOLN
2.0000 g | INTRAVENOUS | Status: AC
  Administered 2020-02-29: 2 g via INTRAVENOUS
  Filled 2020-02-29: qty 100

## 2020-02-29 MED ORDER — METOPROLOL TARTRATE 5 MG/5ML IV SOLN
INTRAVENOUS | Status: AC
  Filled 2020-02-29: qty 5

## 2020-02-29 MED ORDER — CHLORHEXIDINE GLUCONATE 4 % EX LIQD
4.0000 "application " | Freq: Once | CUTANEOUS | Status: DC
  Filled 2020-02-29: qty 60

## 2020-02-29 MED ORDER — MIDAZOLAM HCL 5 MG/5ML IJ SOLN
INTRAMUSCULAR | Status: DC | PRN
  Administered 2020-02-29 (×5): 1 mg via INTRAVENOUS

## 2020-02-29 MED ORDER — SODIUM CHLORIDE 0.9 % IV SOLN
80.0000 mg | INTRAVENOUS | Status: AC
  Administered 2020-02-29: 80 mg
  Filled 2020-02-29: qty 2

## 2020-02-29 MED ORDER — FENTANYL CITRATE (PF) 100 MCG/2ML IJ SOLN
INTRAMUSCULAR | Status: DC | PRN
  Administered 2020-02-29 (×5): 12.5 ug via INTRAVENOUS

## 2020-02-29 MED ORDER — CEFAZOLIN SODIUM-DEXTROSE 2-4 GM/100ML-% IV SOLN
INTRAVENOUS | Status: AC
  Filled 2020-02-29: qty 100

## 2020-02-29 MED ORDER — LIDOCAINE HCL (PF) 1 % IJ SOLN
INTRAMUSCULAR | Status: DC | PRN
  Administered 2020-02-29: 20 mL
  Administered 2020-02-29: 60 mL

## 2020-02-29 SURGICAL SUPPLY — 16 items
BALLN ATTAIN 80 (BALLOONS) ×2
BALLN ATTAIN 80CM 6215 (BALLOONS) ×1
BALLOON ATTAIN 80 (BALLOONS) IMPLANT
CABLE SURGICAL S-101-97-12 (CABLE) ×3 IMPLANT
CATH ATTAIN COM SURV 6250V-MB2 (CATHETERS) ×2 IMPLANT
CATH ATTAIN SEL SURV 6248V-130 (CATHETERS) ×2 IMPLANT
CATH HEX JOSEPH 2-5-2 65CM 6F (CATHETERS) ×2 IMPLANT
ICD CLARIA MRI DTMA1Q1 (ICD Generator) ×2 IMPLANT
LEAD ATTAIN PERFORMA S 4598-88 (Lead) ×2 IMPLANT
LEAD SPRINT QUAT SEC 6935-65CM (Lead) ×2 IMPLANT
PAD PRO RADIOLUCENT 2001M-C (PAD) ×3 IMPLANT
SHEATH 7FR PRELUDE SNAP 13 (SHEATH) ×2 IMPLANT
SHEATH 9.5FR PRELUDE SNAP 13 (SHEATH) ×2 IMPLANT
SHEATH 9FR PRELUDE SNAP 13 (SHEATH) ×2 IMPLANT
TRAY PACEMAKER INSERTION (PACKS) ×3 IMPLANT
WIRE ACUITY WHISPER EDS 4648 (WIRE) ×2 IMPLANT

## 2020-02-29 NOTE — Progress Notes (Signed)
Patient and wife was given discharge instructions. Both verbalized understanding. 

## 2020-02-29 NOTE — Discharge Instructions (Signed)
    Supplemental Discharge Instructions for  Pacemaker/Defibrillator Patients    Tomorrow, 03/01/2020, PLEASE SEND A REMOTE DEVICE TRANSMISSION    Activity No heavy lifting or vigorous activity with your left/right arm for 6 to 8 weeks.  Do not raise your left/right arm above your head for one week.  Gradually raise your affected arm as drawn below.             03/04/2020                   03/05/2020                  03/06/2020                 03/07/2020 __  NO DRIVING for  1 week   ; you may begin driving on  [id number]  .  WOUND CARE - Keep the wound area clean and dry.  Do not get this area wet for one week. No showers for one week; you may shower on  03/07/2020  . - Tomorrow, 03/01/2020, remove the arm sling - Tomorrow, 03/01/2020, remove the outer plastic dressing.  There are steri strips (paper tapes) covering the wound.  DO NOT remove these - The tape/steri-strips on your wound will fall off; do not pull them off.  No bandage is needed on the site.  DO  NOT apply any creams, oils, or ointments to the wound area. - If you notice any drainage or discharge from the wound, any swelling or bruising at the site, or you develop a fever > 101? F after you are discharged home, call the office at once.  Special Instructions - You are still able to use cellular telephones; use the ear opposite the side where you have your pacemaker/defibrillator.  Avoid carrying your cellular phone near your device. - When traveling through airports, show security personnel your identification card to avoid being screened in the metal detectors.  Ask the security personnel to use the hand wand. - Avoid arc welding equipment, MRI testing (magnetic resonance imaging), TENS units (transcutaneous nerve stimulators).  Call the office for questions about other devices. - Avoid electrical appliances that are in poor condition or are not properly grounded. - Microwave ovens are safe to be near or to operate.  Additional  information for defibrillator patients should your device go off: - If your device goes off ONCE and you feel fine afterward, notify the device clinic nurses. - If your device goes off ONCE and you do not feel well afterward, call 911. - If your device goes off TWICE, call 911. - If your device goes off THREE times in one day, call 911.  DO NOT DRIVE YOURSELF OR A FAMILY MEMBER WITH A DEFIBRILLATOR TO THE HOSPITAL--CALL 911.

## 2020-02-29 NOTE — H&P (Signed)
HPI [name] is referred today by Dr. Jacinta Shoe for evaluation of and consideration for ICD insertion. He is a 79 yo man with longstanding atrial fib who has developed LBBB, and severe LV dysfunction despite maximal medical therapy. His EF by echo is 25%. He has not had syncope and does not experience palpitations. He does not have CAD. He does have venous insufficiency and his legs are wrapped. His ABI's however are good, greater than 1. He does not have HTN.  Allergies  Allergen Reactions  . Clarithromycin Other (See Comments)    "aches & pains all over", no appetite, sleepy  . Fluoride Preparations Other (See Comments)    unknown  . Toprol Xl [Metoprolol Tartrate]     Worsening Dyspnea with this in 08/2018           Current Outpatient Medications  Medication Sig Dispense Refill  . acetaminophen (TYLENOL) 500 MG tablet Take 500-1,000 mg by mouth every 6 (six) hours as needed for mild pain or headache.     . allopurinol (ZYLOPRIM) 300 MG tablet Take 1 tablet (300 mg total) by mouth 2 (two) times daily. 180 tablet 0  . ascorbic acid (VITAMIN C) 250 MG CHEW Chew 500 mg by mouth daily.     . carvedilol (COREG) 25 MG tablet Take 1.5 tablets (37.5 mg total) by mouth 2 (two) times daily. 270 tablet 3  . Cholecalciferol (VITAMIN D-3) 125 MCG (5000 UT) TABS Take 5,000 Units by mouth daily.     . furosemide (LASIX) 20 MG tablet Take 20 mg by mouth daily.    Marland Kitchen glipiZIDE (GLUCOTROL) 5 MG tablet Take 1 tablet (5 mg total) by mouth daily. 90 tablet 0  . HYDROcodone-acetaminophen (NORCO/VICODIN) 5-325 MG tablet Take 1 tablet by mouth every 6 (six) hours as needed for moderate pain. 30 tablet 0  . lovastatin (MEVACOR) 40 MG tablet Take 1 tablet (40 mg total) by mouth at bedtime. (Patient taking differently: Take 20 mg by mouth at bedtime. ) 90 tablet 1  . NON FORMULARY Take 3 tablets by mouth daily. MANGA-CAL    . oxyCODONE-acetaminophen (PERCOCET) 5-325 MG tablet  Take 1 tablet by mouth every 6 (six) hours as needed for severe pain. 30 tablet 0  . sacubitril-valsartan (ENTRESTO) 24-26 MG Take 1 tablet by mouth 2 (two) times daily. 60 tablet 11  . spironolactone (ALDACTONE) 25 MG tablet Take 1 tablet (25 mg total) by mouth daily. 90 tablet 3  . vitamin E 400 UNIT capsule Take 400 Units by mouth daily.     Marland Kitchen warfarin (COUMADIN) 5 MG tablet Take 1 tablet (5 mg total) by mouth daily. 90 tablet 0   No current facility-administered medications for this visit.         Past Medical History:  Diagnosis Date  . Cancer ([place])   . CHF (congestive heart failure) ([place])   . Chronic atrial fibrillation ([place])   . Chronic bronchitis   . Diabetes mellitus, type II ([place])    no insulin  . Gout   . History of herpes zoster virus   . History of radiation therapy 12/21/12- 02/15/13   prostate 78 gray in 40 fx  . Hyperlipidemia   . Hypertension   . Prostate cancer ([place]) 2014   EBRT + hormonal therapy  . Vitamin D deficiency disease 06/07/2019    ROS:   All systems reviewed and negative except as noted in the HPI.  Past Surgical History:  Procedure Laterality Date  . AMPUTATION Bilateral 12/18/2019   Procedure: BILATERAL 2ND TOE AMPUTATION DIGIT;  Surgeon: Edrick Kins, DPM;  Location: [place];  Service: Podiatry;  Laterality: Bilateral;  . CATARACT EXTRACTION, BILATERAL    . KNEE SURGERY     left knee  . PROSTATE BIOPSY    . RIGHT/LEFT HEART CATH AND CORONARY ANGIOGRAPHY N/A 10/04/2019   Procedure: RIGHT/LEFT HEART CATH AND CORONARY ANGIOGRAPHY;  Surgeon: Belva Crome, MD;  Location: [facility];  Service: Cardiovascular;  Laterality: N/A;          Family History  Problem Relation Age of Onset  . Kidney failure Mother   . Heart attack Father      Social History        Socioeconomic History  . Marital status: Married    Spouse name: Not on file  . Number of children: Not on file  . Years  of education: Not on file  . Highest education level: Not on file  Occupational History  . Not on file  Tobacco Use  . Smoking status: Never Smoker  . Smokeless tobacco: Never Used  Substance and Sexual Activity  . Alcohol use: No    Alcohol/week: 0.0 standard drinks  . Drug use: No  . Sexual activity: Not on file  Other Topics Concern  . Not on file  Social History Narrative  . Not on file   Social Determinants of Health      Financial Resource Strain:   . Difficulty of Paying Living Expenses:   Food Insecurity:   . Worried About Charity fundraiser in the Last Year:   . Arboriculturist in the Last Year:   Transportation Needs:   . Film/video editor (Medical):   Marland Kitchen Lack of Transportation (Non-Medical):   Physical Activity:   . Days of Exercise per Week:   . Minutes of Exercise per Session:   Stress:   . Feeling of Stress :   Social Connections:   . Frequency of Communication with Friends and Family:   . Frequency of Social Gatherings with Friends and Family:   . Attends Religious Services:   . Active Member of Clubs or Organizations:   . Attends Archivist Meetings:   Marland Kitchen Marital Status:   Intimate Partner Violence:   . Fear of Current or Ex-Partner:   . Emotionally Abused:   Marland Kitchen Physically Abused:   . Sexually Abused:      BP 102/74   Pulse 94   Temp (!) 97.5 F (36.4 C)   Ht 6\' 2"  (1.88 m)   Wt 196 lb 9.6 oz (89.2 kg)   SpO2 97%   BMI 25.24 kg/m   Physical Exam:  diskempt but well appearing NAD HEENT: Unremarkable Neck:  No JVD, no thyromegally Lymphatics:  No adenopathy Back:  No CVA tenderness Lungs:  Clear with no wheezes HEART:  Regular rate rhythm, no murmurs, no rubs, no clicks Abd:  soft, positive bowel sounds, no organomegally, no rebound, no guarding Ext:  2 plus pulses, no edema, no cyanosis, no clubbing Skin:  No rashes no nodules Neuro:  CN II through XII intact, motor grossly intact   EKG - reviewed - atrial  fib with a CVR and LBBB   Assess/Plan: 1. Chronic systolic heart failure - He has class 2 symptoms and LBBB with a QRS of 170. I have discussed the risks/benefits/goals/expectations of ICD insertion and he wishes  to proceed. 2. Atrial fib - his rates are controlled. I suspect we will want to uptitrate his AV nodal blocking drugs when his ICD is in place.   [name].  EP Attending  Patient seen and examined. Agree with the findings as noted above. The patient presents for biv ICD insertion. No change since his last visit.   [name].D.

## 2020-03-01 ENCOUNTER — Telehealth: Payer: Self-pay | Admitting: *Deleted

## 2020-03-01 NOTE — Telephone Encounter (Signed)
Spoke with patient and wife. ICD transmission reviewed, normal CRT-D function, no episodes. Lead tests stable. BiVP 76.3%, likely due to permanent AF. Discussed instructions for wound care and activity restrictions. Pt and wife aware of wound check appointment date and time. They deny any additional questions or concerns at this time.

## 2020-03-01 NOTE — Telephone Encounter (Signed)
Attempted to reach pt to obtain ICD transmission and to discuss any post-implant concerns. Pt's wife reports that she is not home and pt does not have access to a phone at home. She states she will try to make it home prior to end of day so that this RN can speak with pt.

## 2020-03-01 NOTE — Telephone Encounter (Signed)
-----   Message from [place], [place] sent at 02/29/2020  3:15 PM EDT ----- Same day d/c  MDT CRT-D  GT  thanks

## 2020-03-04 ENCOUNTER — Telehealth: Payer: Self-pay | Admitting: Internal Medicine

## 2020-03-04 MED FILL — Lidocaine HCl Local Inj 1%: INTRAMUSCULAR | Qty: 80 | Status: AC

## 2020-03-04 NOTE — Telephone Encounter (Signed)
Left detailed message for Pt's wife.  Ok to continue Magna-cal.  Call back if any further questions.

## 2020-03-04 NOTE — Telephone Encounter (Signed)
Pt c/o medication issue:  1. Name of Medication: magna-cal  2. How are you currently taking this medication (dosage and times per day)? 3 tablets a day  3. Are you having a reaction (difficulty breathing--STAT)? no  4. What is your medication issue? Patient's wife calling stating he has been on the medication for 45-50 years and they would like to know if it is okay for him to keep taking it. She states she does not know the dose, because they were out walking during the call.

## 2020-03-05 ENCOUNTER — Encounter (HOSPITAL_BASED_OUTPATIENT_CLINIC_OR_DEPARTMENT_OTHER): Payer: Medicare HMO | Admitting: Internal Medicine

## 2020-03-05 DIAGNOSIS — I87333 Chronic venous hypertension (idiopathic) with ulcer and inflammation of bilateral lower extremity: Secondary | ICD-10-CM | POA: Diagnosis not present

## 2020-03-05 DIAGNOSIS — I872 Venous insufficiency (chronic) (peripheral): Secondary | ICD-10-CM | POA: Diagnosis not present

## 2020-03-05 DIAGNOSIS — E1142 Type 2 diabetes mellitus with diabetic polyneuropathy: Secondary | ICD-10-CM | POA: Diagnosis not present

## 2020-03-05 DIAGNOSIS — L97221 Non-pressure chronic ulcer of left calf limited to breakdown of skin: Secondary | ICD-10-CM | POA: Diagnosis not present

## 2020-03-05 DIAGNOSIS — I482 Chronic atrial fibrillation, unspecified: Secondary | ICD-10-CM | POA: Diagnosis not present

## 2020-03-05 DIAGNOSIS — I110 Hypertensive heart disease with heart failure: Secondary | ICD-10-CM | POA: Diagnosis not present

## 2020-03-05 DIAGNOSIS — E11622 Type 2 diabetes mellitus with other skin ulcer: Secondary | ICD-10-CM | POA: Diagnosis not present

## 2020-03-05 DIAGNOSIS — Z8546 Personal history of malignant neoplasm of prostate: Secondary | ICD-10-CM | POA: Diagnosis not present

## 2020-03-05 DIAGNOSIS — Z7901 Long term (current) use of anticoagulants: Secondary | ICD-10-CM | POA: Diagnosis not present

## 2020-03-05 DIAGNOSIS — E1151 Type 2 diabetes mellitus with diabetic peripheral angiopathy without gangrene: Secondary | ICD-10-CM | POA: Diagnosis not present

## 2020-03-05 DIAGNOSIS — I509 Heart failure, unspecified: Secondary | ICD-10-CM | POA: Diagnosis not present

## 2020-03-05 DIAGNOSIS — L03116 Cellulitis of left lower limb: Secondary | ICD-10-CM | POA: Diagnosis not present

## 2020-03-05 DIAGNOSIS — I890 Lymphedema, not elsewhere classified: Secondary | ICD-10-CM | POA: Diagnosis not present

## 2020-03-05 DIAGNOSIS — I87322 Chronic venous hypertension (idiopathic) with inflammation of left lower extremity: Secondary | ICD-10-CM | POA: Diagnosis not present

## 2020-03-05 DIAGNOSIS — I429 Cardiomyopathy, unspecified: Secondary | ICD-10-CM | POA: Diagnosis not present

## 2020-03-06 NOTE — Progress Notes (Signed)
[name] ([id number]) , Visit Report for 03/05/2020 HPI Details Patient Name: Date of Service: VENCE, LALOR 03/05/2020 8:30 A M Medical Record Number: [id number] Patient Account Number: [id number] Date of Birth/Sex: Treating RN: [birth year] (78 y.o. [name]) [name] Primary Care Provider: Leanord Asal, SA RA H Other Clinician: Referring Provider: Treating Provider/Extender: Candie Mile, SA RA H Weeks in Treatment: 49 History of Present Illness Location: Patient presents with a wound to left lower leg. Quality: Patient reports No Pain. Duration: 2 months HPI Description: no cig or alcohol. spontaneous appearance in area of stasis dermamtitis. d.m. on metformin only. chronic afib on Coumadin. diabetes and coag studies not good. hba1c 7.5. ivr 4.5. no pain or sxs of systemic disease. hx chf. no intermittent claudication 02/28/2019 Readmission This is a now a 79 year old man who was previously cared for in 2016 by Dr. Lindon Romp for wounds on his lower extremities. At that point he had venous reflux studies although I cannot seem to open these in [place] link. He had arterial studies showing an ABI of 1.11 on the right and 1.27 on the left his waveforms were triphasic bilaterally. He was discharged in stockings although I do not believe he is wearing these in some time. He tells me that about a month ago he noted openings of a large wound on the posterior right calf and 2 smaller areas on the left lateral calf and a small area more recently on the left posterior calf. He has been dressing these with peroxide and triple antibiotic ointment. He is not wearing compression. Past medical history; type 2 diabetes with peripheral neuropathy, chronic venous insufficiency, hypertension, cardiomyopathy, chronic atrial fibrillation on Coumadin, prostate cancer, hyperlipidemia, gout, ABI in our clinic was 1.34 on the left and not obtainable on the right 6/9; this is a patient who has chronic  venous insufficiency. He has a fairly substantial area on the right posterior calf, left lateral calf and a small area on the left posterior calf. On arrival last week he had very palpable popliteal and femoral pulses but nothing in his bilateral feet. Unfortunately we cannot get arterial studies until July 1 at Dr. Kennon Holter office. They live in [place]. We use silver alginate under Kerlix Coban 6/16; patient with chronic venous insufficiency with wounds on his bilateral lower extremities. When he came into our clinic he was discovered to have a complete absence of peripheral pedal pulses at either the dorsalis pedis or posterior tibial. He does have easily palpable femoral and popliteal pulses. He sees Dr. [name] tomorrow for noninvasive arterial tests. He may also require venous reflux evaluation although I do not view this as an urgent thing. We have been using silver alginate. His wound surfaces of cleaned up quite nicely 6/23; patient with chronic venous insufficiency with wounds on his bilateral lower extremities. His wounds all are somewhat better looking. He did go to Dr. Kennon Holter office but somehow ended up on the doctors schedule rather than being scheduled for noninvasive tests therefore his noninvasive tests are scheduled for July 1. We agree that he has venous insufficiency ulcers but I cannot feel any pulses in his lower extremities dictating the need for test. We are only using Kerlix and light Coban unfortunately this appears to be holding the edema 6/30; has his arterial studies tomorrow. We have been using Kerlix and light Coban will go to a more aggressive compression if the arterial studies will allow. We all agreed these are venous wounds however  I cannot feel pulses at either the dorsalis pedis or posterior tibial bilaterally. His wounds generally look some better including left lateral and right posterior. 7/7-Patient returns at 1 week in Kerlix/Coban to both legs, with  improvement, in the left lateral and right posterior lower leg wounds, ABI's are normal in both legs per vascular studies, TBI is also normal on both sides, we are using hydrofera blue to the wounds 7/14; patient's arterial studies from 2 weeks ago showed an ABI on the right at 1.03 with a TBI of 0.86. On the left the ABI was 1.06 with a TBI of 0.84. Notable for the fact that his arterial waveforms were monophasic in all of the lower extremity arteries suggesting some degree of arterial occlusive disease but in general this was felt to be fairly adequate for healing. His compression was increased from 2-3 layer which is appropriate. Dressing was changed to [facility] 7/21; patient's wounds are measuring smaller. The more substantial one on the right posterior calf, second 1 on the left lateral calf. Using [facility] on both wound areas 7/28; patient continues to make nice improvements. The area on the right posterior calf is smaller. Area on the left lateral calf also is smaller. We have been using Hydrofera Blue under compression. The patient will need compression stockings and we have measured him for these in the eventuality that these heal which really should not be too long from now 8/4-Patient continues to make improvement, the right posterior calf area smaller with rim of keratotic skin on one side, the left wound is definitely smaller and improving. 8/11-Returns at 1 week, after being in 3 layer compression on both legs, both wounds appear to be improving, making good progress, patient is happy, pain is also less especially in the right leg wound 8/18; the area on the left anterior lower leg is healed. On the right posterior leg the wound remains although the dimensions are a lot better. 8/25; he arrives in clinic today with a large body of open wound on the left lateral calf. All of the 3 wounds in this area are in close juxtaposition to each other. The story is that we discharged  him last week with no a wrap on the left leg. They went to [place] could not get in as they are only excepting phone orders or online orders for stockings hence they did not put any stocking on the left leg all week. They have something at home but the patient with that was either incapable or just did not put them on. Apparently these opened 1 morning after getting out of bed. The area on the right has no real change 9/1; patient has bilateral lower extremity wounds in the setting of severe chronic venous insufficiency and secondary lymphedema. He arrived last week with new areas on the left lateral lower leg after we did not wrap him and he did not use his stockings. Nevertheless the areas on the left look better today under compression. Posterior right calf does not really changed. We are using Hydrofera Blue on both areas under compression 9/15; bilateral lower extremity wounds in the setting of severe chronic venous insufficiency and secondary lymphedema. He has 20 to 30 mmHg below-knee compression stockings under the eventuality that these close over. We did get the left leg to close but he did not transition to a stocking and this reopened. There are 2 open areas on the left posterior lateral calf and one on the right. Both of these  look satisfactory. Using [facility] 9/22; bilateral lower extremity wounds in the setting of chronic venous insufficiency. 2 superficial areas on the left lateral calf. One on the right just above the Achilles area. We have good edema control we have been using Hydrofera Blue 9/29; the areas on the left lateral calf are healed. On the right just above the Achilles and tendon area things look a lot better small wounds one scabbed area. We have been using Hydrofera Blue. We can discharge him in his own stocking on the left still wrapping on the right. This is the second time we have healed the left leg but he did not put a stocking on last time. Hopefully this  will maintain the edema from chronic venous disease with secondary lymphedema 10/6; he comes in today having a stocking on the left leg. They had trouble getting it on there is a lot of increase in swelling 2 small open areas one anteriorly and one on the medial calf. They report a lot of difficulty getting the stocking on. Paradoxically the area on the right that we have been wrapping posteriorly is closed 10/13; he comes in today with wounds bilaterally including superficial areas on the left medial and left lateral calf. As well as the right posterior has reopened in the Achilles area superiorly. He still does not have his juxta lite stockings although truthfully we would not of been able to use them today anyway. Apparently have been ordered and paid for from [employer] although they have not been delivered 10/20; his area on the right is just the boat closed on the right posterior. Still has the area on the left lateral and a very tiny area on the left medial. He has his bilateral juxta lites although he is not ready for them this week. He tolerated the increase to 4 layer compression last week quite well 10/27; the area on the right posterior calf is once again closed. He has a superficial area on the left lateral calf that is still open. He has been using Hydrofera Blue and bilateral 4-layer compression. He can change to his own juxta lite stocking on the right and we are instructing him today 11/3; the area on the right posterior calf reopened according to the patient and his wife after they took off the stocking when they got home last week. Apparently scabbed over there is now a fairly substantial wound which looks pretty much the same. Our intake nurse noted that they were using the juxta lite stockings appropriately. I was really hoping I might be able to close him out today. He has 1 very tiny remaining area on the left lateral lower leg. 11/10; right posterior calf wound measures smaller but  is still open. We have been using Hydrofera Blue. On the left he has a small oval-shaped wound and he seems to have had another wound distally that is open and likely a blister. We are using Hydrofera Blue under compression 11/17; right posterior calf wound continues to get better. We have been using Hydrofera Blue. On the left lateral one of the wounds has closed still a small open area. We have been using Hydrofera Blue on this as well. Both areas have been under 4-layer compression Arrives in clinic today with some swelling in the dorsal foot on the right some erythema of his forefoot and toes. Initially when I looked at this I almost thought this was a sunburn distal to a wrap injury. 12/1; right posterior calf wound  debrided with a curette. We have been using Hydrofera Blue on the left anterior lateral he has an area across the mid tibia. Finally a small area on the left lateral lower calf. Finally he continues to have de-epithelialized areas on the dorsal aspect of his toes. Initially thought this might be a burn injury when I saw him 2 weeks ago. I now wonder about tinea. I have also reviewed his arterial studies which were really quite good in July/20 with normal TBI's and ABIs but monophasic waveforms 12/8; comes in today with worsening problems especially on the left leg where he now has a cluster of wounds in the left anterior mid tibia. Very poor edema control. I reduced him to 3 layer from 4 layer compression last week because of some concern about blood flow to his toes however he does not have good edema control on the left leg. Right leg edema control looks satisfactory. On the left he has a cluster of wounds anteriorly, small area on the left medial fifth met head and then the collection of areas on his toes which appear better On the right he has the original area on the right posterior calf, a new area right medially. His formal arterial studies from mid July are noted below. He was  evaluated by Dr.Berry ABI Findings: +---------+------------------+-----+----------+--------+ Right Rt Pressure (mmHg)IndexWaveform Comment  +---------+------------------+-----+----------+--------+ Brachial 176     +---------+------------------+-----+----------+--------+ ATA 176 0.99 monophasic  +---------+------------------+-----+----------+--------+ PTA 183 1.03 monophasic  +---------+------------------+-----+----------+--------+ PERO 172 0.97 monophasic  +---------+------------------+-----+----------+--------+ Great T oe153 0.86    +---------+------------------+-----+----------+--------+ +---------+------------------+-----+-----------+-------+ Left Lt Pressure (mmHg)IndexWaveform Comment +---------+------------------+-----+-----------+-------+ Brachial 178     +---------+------------------+-----+-----------+-------+ ATA 162 0.91 multiphasic  +---------+------------------+-----+-----------+-------+ PTA 188 1.06 multiphasic  +---------+------------------+-----+-----------+-------+ PERO 158 0.89 monophasic   +---------+------------------+-----+-----------+-------+ Great T oe150 0.84    +---------+------------------+-----+-----------+-------+ +-------+-----------+-----------+------------+------------+ ABI/TBIT oday's ABIT oday's TBIPrevious ABIPrevious TBI +-------+-----------+-----------+------------+------------+ Right 1.03 0.86 1.11   +-------+-----------+-----------+------------+------------+ Left 1.06 0.84 1.27   +-------+-----------+-----------+------------+------------+ Tibial waveforms somewhat difficult to record due to irregular heartbeat. Bilateral ABIs appear essentially unchanged compared to prior study on 06/21/15. Summary: Right: Resting right ankle-brachial index is within normal range. No evidence of significant right lower extremity arterial disease. The right toe-brachial  index is normal. Although ankle brachial indices are within normal limits (0.95-1.29), arterial Doppler waveforms at the ankle suggest some component of arterial occlusive disease. Left: Resting left ankle-brachial index is within normal range. No evidence of significant left lower extremity arterial disease. The left toe-brachial index is normal. Although ankle brachial indices are within normal limits (0.95-1.29), arterial Doppler waveforms at the ankle suggest some component of arterial occlusive disease. 12/15; the patient's area on the left mid tibia looks better. Right posterior calf also better. He has the area on the left foot as well. All of his toes look better I think this was tinea. We are using Hydrofera Blue everywhere else The patient was in urgent care yesterday with wheezing and shortness of breath. He got azithromycin and prednisone. He feels better. His lungs are currently clear to auscultation. He was not tested for Covid 19 12/29; the patient arrives in clinic today with quite a bit change. 2 weeks ago he only had areas on the left mid tibia right posterior calf with tinea pedis resolving between his toes. He arrives in clinic today with several areas on the dorsal toes on the right, dorsal left second toe. He has skin breakdown in the left medial calf probably from excess edema. Small area proximally in the medial calf. He has  weeping edema fluid coming out of the skin excoriations on the left medial calf. He tells me that he is having a cardiac catheterization next week. I had a quick look at [facility] link. He was found to have an ejection fraction of 25% during the work-up for persistent atrial fibrillation. He saw his cardiology office yesterday seen by the nurse practitioner. She increased his carvedilol. He has not been on diuretics for apparently several months and indeed in the nurse practitioner Dietrich Pates notes she had knowledge of this. 10/04/2019 on evaluation  today patient presents as a walk-in visit concerning the fact that he did not have an appointment here for our clinic at this point. He actually had a cardiac catheterization earlier today and then came from there to here in order to be evaluated. With that being said unfortunately he is having significant cellulitis of his left lower extremity upon evaluation today this appears to have deteriorated even since last week's evaluation with Dr. Dellia Nims. The right lower extremity is actually doing okay I really see no evidence of deterioration at this point at those locations. In fact the right leg seems in general be doing quite well. Nonetheless I am concerned about infection and cellulitis of the left lower extremity and again considering his weakened heart I do not want him to develop into sepsis at all. He is also having some trouble breathing today and I understand according to nursing staff this is always the case to some degree. With that being said the patient unfortunately seems to be in my opinion a little bit worse even his wife feels like that may be the case today. Unsure exactly what is leading to this. Cardiac catheterization I did review the report which showed an ejection fraction of 25% he also had an LAD blockage of around 25% based on what I saw. With that being said there was no significant blockages that required stenting at this point he does have weakened cardiac muscles compared to normal. 10/05/2019; patient was seen yesterday in clinic. He was sent to the ER because of cellulitis of the left leg possibility. In the ER he was given 1 dose of IV Levaquin and discharged on Keflex. He came in the clinic initially for a nurse visit to rewrap his left leg. We did not look at the right leg today that is an Haematologist. The patient also had a cardiac cath. According to him there were no blockages but a very low ejection fraction. 1/12; back for an early follow-up. The condition of the left  lower leg is a lot better although there are multiple open areas. All of them with not a particularly viable surface. On the right posterior calf he has a single wound with a clean surface. He has a wound with exposed bone on the PIP of the left second toe dorsally. He has wounds on the dorsal right first second and third toes. His arterial studies were normal. 1/19; the patient has 3 open wounds on the left leg anteriorly in the mid tibia, distally and medially just above the ankle and posteriorly. On the right he has a small area dime sized on the right posterior calf. His edema control is a lot better. He still has wounds on the right second and left second toes. The left second toe has exposed bone. X-ray I did last week did not show evidence of osteomyelitis in the left foot. 1/26; patient with a multiplicity of wounds and problems. On the  left he has a circular area on the left anterior mid tibia Left just above the medical ankle -left 2nd toe pip -right 2nd toe right posterior calf 2/2; patient with a multiplicity of wounds and problems. He has severe chronic venous insufficiency and the wounds on his leg are all on the left left anterior left medial at the medial malleolus and left posterior. We have been using Iodoflex to these areas to help with ongoing debridement. He also has largely traumatic wounds on his toes this includes the left second with exposed bone. Bone culture I did last week showed Staph aureus which is methicillin sensitive. I discussed with him today the idea of an amputation of this toe because it is literally nonfunctional however he wants to try antibiotics. Antibiotic choice is complicated by the fact that he is on Coumadin. He also has wounds over the dorsal part of the right first which is close to closed. Right second toe has exposed bone and the right third toe at the base of the right third toe is just about closed as well. We have been using silver  alginate 2/9; patient with severe chronic venous insufficiency. He has large wounds on the left anterior mid tibia and on the left medial just above the ankle. I am concerned today about the depth of the area on the left mid tibia may be exposing into the muscle. He has small areas on the left posterior calf and on the right posterior calf although these do not look as threatening. He also has traumatic wounds on his toes. The right first and third are closed however the bilateral second toes have exposed bone. The area on the left is open into the distal interphalangeal joint. In my opinion these toes are nonsalvageable and will need amputation 2/16; patient with severe chronic venous insufficiency. He has wounds on the left anterior mid tibia left medial lower leg just above the ankle. He has small areas on the left posterior calf and a more prominent area on the right posterior calf. All of these are related to poorly controlled venous hypertension. He also had traumatic wounds on the PIPs of both second toes. Unfortunately both of these have exposed bone and in the case of the left this I think goes right into the joint itself. I do not think either 1 of these is salvageable. I have reviewed his arterial evaluation that was done in July last year. He also saw Dr. [name] in June. He felt these were venous stasis. Previously had segmental arterial pressures performed in 11/03/2014 which were normal. He was sent for for a follow-up arterial evaluation on July 1. On the right his ABIs were 1.03 with a TBI of 0.86 although his waveforms were monophasic on the left he had multiphasic waveforms at the ATA PTA but monophasic at the peroneal nevertheless his ABI was normal at 1.06 TBI also normal at 0.84. I think he has enough blood flow to heal an amputation which I think he needs of the left second toe and probably the right second toe as well 2/23; the patient is going for his bilateral second toe  amputation by Dr. Amalia Hailey on Friday. In the meantime is 3 areas on the left leg and the small area on the right posterior look better. We have been using silver collagen. 3/2; the patient's toe amputations were canceled because of cardiac concerns. Apparently this surgery will need to be done in the hospital although they do not have an  appointment. In the meantime he is continues to have 3 areas on the left leg one anteriorly and 2 smaller areas posteriorly on the left as well as the small posterior area on the right. We have been using silver collagen with compression 3/9; still no word on the second toe amputations bilaterally. 3 wounds on the left leg all look better and the area on the right posterior we have been using silver collagen I change this to [facility] 3/16; amputations next Monday both second toes he is apparently going to be hospitalized. He has on his left leg including left anterior and left lateral look better very superficial also the right posterior. 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 He continues to have left leg wounds anteriorly x2 posteriorly x1 at the left lower leg just above the ankle anteriorly x1 and posteriorly on the right x1 3/30; his anterior left leg wounds are considerably smaller. In fact the only wound that is not smaller is on the right. We have been using Hydrofera Blue under Kerlix Cobax 4/6; his anterior left leg wounds continue to contract in fact the area distally and medially has closed over. He still has the area anteriorly on the left. Both posterior calf wounds on the left and right are much smaller. We saw his surgical wound at the left second toe amputation site that still sutured it looks healthy, this is being followed by his podiatrist. We went over what compression he has he has bilateral juxta lites I am hopeful the issue here was that  we just did not have them tight enough because he broke down very quickly the last time we healed him out. 4/13; the patient has 3 remaining wounds one small area on the left anterior lower leg one on the left posterior and one on the right anterior. We have been using Hydrofera Blue under compression compression all of these are contracting. We did discuss life in the eventuality he will totally closed which seems likely in the next week or 2. He has bilateral juxta lite stockings. These will need to be set at 30-40 compression. Since we last closed him over for his bilateral lower extremity wounds he is also been treated for heart failure which may help. He undoubtedly has severe chronic venous hypertension however as well 4/19; the area on the left anterior tibial area is closed. The area on the right posterior calf is just above closed. The area on the left posterior calf has some depth. 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 Everything looks quite good here. He does not need a specific dressing 4/27; the right posterior leg wound is closed there is nothing open on the right. Still has a small punched out area on the left posterior calf. He will be transitioned into the juxta lite stocking on the right today. Still wrapping the left leg 5/4; right leg is still closed although we did not take the juxta lite stocking off. Will do this next week. Small punched out area on the left posterior calf is still not healed we have been using silver collagen under compression 5/11; right leg is still closed although he has dry flaking skin on the dorsal and plantar  part of his foot. No real evidence of infection. The one remaining wound on the left posterior calf. This is actually measuring somewhat larger we have been using Hydrofera  Blue 02/20/20-Patient returns at 2 weeks, the wound on the posterior calf is about the same, there are 2 new areas on the anterior shin on the left both are very small and seem more like abrasion wounds, there is also a circular area on the posterior aspect of rash which is very very mild 6/1; the patient has a very small area on the left posterior calf and a vulnerable area on the left anterior mid tibia. The latter is epithelialized but not robust. We have been using PolyMem under compression 6/8; everything is closed on the left leg including the left posterior calf and the previously reported vulnerable area on the left mid tibia. We can graduate on the left leg to his juxta lite stocking they have been familiar with this from use on the right leg for about a month now. The patient has severe chronic venous insufficiency. During this stay in clinic 1 we are dealing with venous wounds on his legs he developed abrasions on the dorsal aspect of both's second toes which subsequently developed osteomyelitis. He had both second toes amputated by Dr. Amalia Hailey. In any case we finally have the venous insufficiency wounds on the left heel with good edema control. Electronic Signature(s) Signed: 03/06/2020 4:17:30 PM By: [name] MD Entered By: [name] on 03/05/2020 09:37:55 -------------------------------------------------------------------------------- Physical Exam Details Patient Name: Date of Service: [name] 03/05/2020 8:30 A M Medical Record Number: [id number] Patient Account Number: [id number] Date of Birth/Sex: Treating RN: 15-Feb-1941 (78 y.o. [name]) [name] Primary Care Provider: Leanord Asal, SA RA H Other Clinician: Referring Provider: Treating Provider/Extender: Candie Mile, SA RA H Weeks in Treatment: 12 Respiratory work of breathing is normal. Cardiovascular Pedal pulses are palpable. Integumentary (Hair, Skin) Severe chronic venous changes in the left leg  with hemosiderin deposition. Notes Wound exam; left posterior calf ulcer has resolved. The area on the left anterior mid tibia that was epithelialized last week but looks vulnerable also looks more mature and stable. We have good edema control. Electronic Signature(s) Signed: 03/06/2020 4:17:30 PM By: [name] MD Entered By: [name] on 03/05/2020 09:38:57 -------------------------------------------------------------------------------- Physician Orders Details Patient Name: Date of Service: [name] 03/05/2020 8:30 A M Medical Record Number: [id number] Patient Account Number: [id number] Date of Birth/Sex: Treating RN: 18-Mar-1941 (78 y.o. [name]) [name] Primary Care Provider: Leanord Asal, SA RA H Other Clinician: Referring Provider: Treating Provider/Extender: Candie Mile, SA RA H Weeks in Treatment: 14 Verbal / Phone Orders: No Diagnosis Coding ICD-10 Coding Code Description L97.221 Non-pressure chronic ulcer of left calf limited to breakdown of skin I87.333 Chronic venous hypertension (idiopathic) with ulcer and inflammation of bilateral lower extremity E11.51 Type 2 diabetes mellitus with diabetic peripheral angiopathy without gangrene E11.42 Type 2 diabetes mellitus with diabetic polyneuropathy Discharge From [facility] Services Discharge from [place] - patient to apply lotion daily, patient to wear stockings daily, on in the am, off in the pm Electronic Signature(s) Signed: 03/05/2020 4:34:33 PM By: [name] RN Signed: 03/06/2020 4:17:30 PM By: [name] MD Entered By: [name] on 03/05/2020 09:34:28 -------------------------------------------------------------------------------- Problem List Details Patient Name: Date of Service: [name] 03/05/2020 8:30 A M Medical Record Number: [id number] Patient Account Number: [id number] Date of Birth/Sex: Treating RN: 1941-02-06 (78  y.o. [name] Primary Care Provider: Leanord Asal, [place]  RA H Other Clinician: Referring Provider: Treating Provider/Extender: Candie Mile, SA RA H Weeks in Treatment: 48 Active Problems ICD-10 Encounter Code Description Active Date MDM Diagnosis L97.221 Non-pressure chronic ulcer of left calf limited to breakdown of skin 02/28/2019 No Yes I87.333 Chronic venous hypertension (idiopathic) with ulcer and inflammation of 02/28/2019 No Yes bilateral lower extremity E11.51 Type 2 diabetes mellitus with diabetic peripheral angiopathy without gangrene 02/28/2019 No Yes E11.42 Type 2 diabetes mellitus with diabetic polyneuropathy 02/28/2019 No Yes Inactive Problems ICD-10 Code Description Active Date Inactive Date L97.211 Non-pressure chronic ulcer of right calf limited to breakdown of skin 02/28/2019 02/28/2019 B35.3 Tinea pedis 09/05/2019 09/05/2019 L97.521 Non-pressure chronic ulcer of other part of left foot limited to breakdown of skin 09/26/2019 09/26/2019 L97.524 Non-pressure chronic ulcer of other part of left foot with necrosis of bone 10/10/2019 10/10/2019 L97.511 Non-pressure chronic ulcer of other part of right foot limited to breakdown of skin 09/26/2019 09/26/2019 K87.68 Chronic systolic (congestive) heart failure 10/05/2019 10/05/2019 T15.726 Other chronic osteomyelitis, left ankle and foot 10/31/2019 10/31/2019 Resolved Problems Electronic Signature(s) Signed: 03/06/2020 4:17:30 PM By: [name] MD Entered By: [name] on 03/05/2020 09:35:19 -------------------------------------------------------------------------------- Progress Note Details Patient Name: Date of Service: [name] 03/05/2020 8:30 A M Medical Record Number: [id number] Patient Account Number: [id number] Date of Birth/Sex: Treating RN: 1941/02/21 (78 y.o. [name]) [name] Primary Care Provider: Leanord Asal, SA RA H Other Clinician: Referring Provider: Treating Provider/Extender: Candie Mile, SA RA H Weeks in Treatment: 53 Subjective History of  Present Illness (HPI) The following HPI elements were documented for the patient's wound: Location: Patient presents with a wound to left lower leg. Quality: Patient reports No Pain. Duration: 2 months no cig or alcohol. spontaneous appearance in area of stasis dermamtitis. d.m. on metformin only. chronic afib on Coumadin. diabetes and coag studies not good. hba1c 7.5. ivr 4.5. no pain or sxs of systemic disease. hx chf. no intermittent claudication 02/28/2019 Readmission This is a now a 79 year old man who was previously cared for in 2016 by Dr. Lindon Romp for wounds on his lower extremities. At that point he had venous reflux studies although I cannot seem to open these in [place] link. He had arterial studies showing an ABI of 1.11 on the right and 1.27 on the left his waveforms were triphasic bilaterally. He was discharged in stockings although I do not believe he is wearing these in some time. He tells me that about a month ago he noted openings of a large wound on the posterior right calf and 2 smaller areas on the left lateral calf and a small area more recently on the left posterior calf. He has been dressing these with peroxide and triple antibiotic ointment. He is not wearing compression. Past medical history; type 2 diabetes with peripheral neuropathy, chronic venous insufficiency, hypertension, cardiomyopathy, chronic atrial fibrillation on Coumadin, prostate cancer, hyperlipidemia, gout, ABI in our clinic was 1.34 on the left and not obtainable on the right 6/9; this is a patient who has chronic venous insufficiency. He has a fairly substantial area on the right posterior calf, left lateral calf and a small area on the left posterior calf. On arrival last week he had very palpable popliteal and femoral pulses but nothing in his bilateral feet. Unfortunately we cannot get arterial studies until July 1 at Dr. Kennon Holter office. They live in [place]. We use silver alginate under Kerlix  Coban 6/16; patient with chronic venous insufficiency with wounds on his bilateral lower extremities. When he came into our clinic he was discovered to have a complete absence of peripheral pedal pulses at either the dorsalis pedis or posterior tibial. He does have easily palpable femoral and popliteal pulses. He sees Dr. [name] tomorrow for noninvasive arterial tests. He may also require venous reflux evaluation although I do not view this as an urgent thing. We have been using silver alginate. His wound surfaces of cleaned up quite nicely 6/23; patient with chronic venous insufficiency with wounds on his bilateral lower extremities. His wounds all are somewhat better looking. He did go to Dr. Kennon Holter office but somehow ended up on the doctors schedule rather than being scheduled for noninvasive tests therefore his noninvasive tests are scheduled for July 1. We agree that he has venous insufficiency ulcers but I cannot feel any pulses in his lower extremities dictating the need for test. We are only using Kerlix and light Coban unfortunately this appears to be holding the edema 6/30; has his arterial studies tomorrow. We have been using Kerlix and light Coban will go to a more aggressive compression if the arterial studies will allow. We all agreed these are venous wounds however I cannot feel pulses at either the dorsalis pedis or posterior tibial bilaterally. His wounds generally look some better including left lateral and right posterior. 7/7-Patient returns at 1 week in Kerlix/Coban to both legs, with improvement, in the left lateral and right posterior lower leg wounds, ABI's are normal in both legs per vascular studies, TBI is also normal on both sides, we are using hydrofera blue to the wounds 7/14; patient's arterial studies from 2 weeks ago showed an ABI on the right at 1.03 with a TBI of 0.86. On the left the ABI was 1.06 with a TBI of 0.84. Notable for the fact that his arterial waveforms  were monophasic in all of the lower extremity arteries suggesting some degree of arterial occlusive disease but in general this was felt to be fairly adequate for healing. His compression was increased from 2-3 layer which is appropriate. Dressing was changed to [facility] 7/21; patient's wounds are measuring smaller. The more substantial one on the right posterior calf, second 1 on the left lateral calf. Using [facility] on both wound areas 7/28; patient continues to make nice improvements. The area on the right posterior calf is smaller. Area on the left lateral calf also is smaller. We have been using Hydrofera Blue under compression. The patient will need compression stockings and we have measured him for these in the eventuality that these heal which really should not be too long from now 8/4-Patient continues to make improvement, the right posterior calf area smaller with rim of keratotic skin on one side, the left wound is definitely smaller and improving. 8/11-Returns at 1 week, after being in 3 layer compression on both legs, both wounds appear to be improving, making good progress, patient is happy, pain is also less especially in the right leg wound 8/18; the area on the left anterior lower leg is healed. On the right posterior leg the wound remains although the dimensions are a lot better. 8/25; he arrives in clinic today with a large body of open wound on the left lateral calf. All of the 3 wounds in this area are in close juxtaposition to each other. The story is that we discharged him last week with no a wrap on the left leg. They  went to [facility] could not get in as they are only excepting phone orders or online orders for stockings hence they did not put any stocking on the left leg all week. They have something at home but the patient with that was either incapable or just did not put them on. Apparently these opened 1 morning after getting out of bed. The area on the right  has no real change 9/1; patient has bilateral lower extremity wounds in the setting of severe chronic venous insufficiency and secondary lymphedema. He arrived last week with new areas on the left lateral lower leg after we did not wrap him and he did not use his stockings. Nevertheless the areas on the left look better today under compression. Posterior right calf does not really changed. We are using Hydrofera Blue on both areas under compression 9/15; bilateral lower extremity wounds in the setting of severe chronic venous insufficiency and secondary lymphedema. He has 20 to 30 mmHg below-knee compression stockings under the eventuality that these close over. We did get the left leg to close but he did not transition to a stocking and this reopened. There are 2 open areas on the left posterior lateral calf and one on the right. Both of these look satisfactory. Using [facility] 9/22; bilateral lower extremity wounds in the setting of chronic venous insufficiency. 2 superficial areas on the left lateral calf. One on the right just above the Achilles area. We have good edema control we have been using Hydrofera Blue 9/29; the areas on the left lateral calf are healed. On the right just above the Achilles and tendon area things look a lot better small wounds one scabbed area. We have been using Hydrofera Blue. We can discharge him in his own stocking on the left still wrapping on the right. This is the second time we have healed the left leg but he did not put a stocking on last time. Hopefully this will maintain the edema from chronic venous disease with secondary lymphedema 10/6; he comes in today having a stocking on the left leg. They had trouble getting it on there is a lot of increase in swelling 2 small open areas one anteriorly and one on the medial calf. They report a lot of difficulty getting the stocking on. ooParadoxically the area on the right that we have been wrapping posteriorly is  closed 10/13; he comes in today with wounds bilaterally including superficial areas on the left medial and left lateral calf. As well as the right posterior has reopened in the Achilles area superiorly. He still does not have his juxta lite stockings although truthfully we would not of been able to use them today anyway. Apparently have been ordered and paid for from [employer] although they have not been delivered 10/20; his area on the right is just the boat closed on the right posterior. Still has the area on the left lateral and a very tiny area on the left medial. He has his bilateral juxta lites although he is not ready for them this week. He tolerated the increase to 4 layer compression last week quite well 10/27; the area on the right posterior calf is once again closed. He has a superficial area on the left lateral calf that is still open. He has been using Hydrofera Blue and bilateral 4-layer compression. He can change to his own juxta lite stocking on the right and we are instructing him today 11/3; the area on the right posterior calf reopened according  to the patient and his wife after they took off the stocking when they got home last week. Apparently scabbed over there is now a fairly substantial wound which looks pretty much the same. Our intake nurse noted that they were using the juxta lite stockings appropriately. I was really hoping I might be able to close him out today. He has 1 very tiny remaining area on the left lateral lower leg. 11/10; right posterior calf wound measures smaller but is still open. We have been using Hydrofera Blue. On the left he has a small oval-shaped wound and he seems to have had another wound distally that is open and likely a blister. We are using Hydrofera Blue under compression 11/17; right posterior calf wound continues to get better. We have been using Hydrofera Blue. On the left lateral one of the wounds has closed still a small open area. We have been  using Hydrofera Blue on this as well. Both areas have been under 4-layer compression Arrives in clinic today with some swelling in the dorsal foot on the right some erythema of his forefoot and toes. Initially when I looked at this I almost thought this was a sunburn distal to a wrap injury. 12/1; right posterior calf wound debrided with a curette. We have been using Hydrofera Blue on the left anterior lateral he has an area across the mid tibia. Finally a small area on the left lateral lower calf. Finally he continues to have de-epithelialized areas on the dorsal aspect of his toes. Initially thought this might be a burn injury when I saw him 2 weeks ago. I now wonder about tinea. I have also reviewed his arterial studies which were really quite good in July/20 with normal TBI's and ABIs but monophasic waveforms 12/8; comes in today with worsening problems especially on the left leg where he now has a cluster of wounds in the left anterior mid tibia. Very poor edema control. I reduced him to 3 layer from 4 layer compression last week because of some concern about blood flow to his toes however he does not have good edema control on the left leg. Right leg edema control looks satisfactory. ooOn the left he has a cluster of wounds anteriorly, small area on the left medial fifth met head and then the collection of areas on his toes which appear better ooOn the right he has the original area on the right posterior calf, a new area right medially. His formal arterial studies from mid July are noted below. He was evaluated by Dr.Berry ABI Findings: +---------+------------------+-----+----------+--------+ Right Rt Pressure (mmHg)IndexWaveform Comment  +---------+------------------+-----+----------+--------+ Brachial 176     +---------+------------------+-----+----------+--------+ ATA 176 0.99 monophasic  +---------+------------------+-----+----------+--------+ PTA 183 1.03  monophasic  +---------+------------------+-----+----------+--------+ PERO 172 0.97 monophasic  +---------+------------------+-----+----------+--------+ Great T oe153 0.86    +---------+------------------+-----+----------+--------+ +---------+------------------+-----+-----------+-------+ Left Lt Pressure (mmHg)IndexWaveform Comment +---------+------------------+-----+-----------+-------+ Brachial 178     +---------+------------------+-----+-----------+-------+ ATA 162 0.91 multiphasic  +---------+------------------+-----+-----------+-------+ PTA 188 1.06 multiphasic  +---------+------------------+-----+-----------+-------+ PERO 158 0.89 monophasic   +---------+------------------+-----+-----------+-------+ Great T oe150 0.84    +---------+------------------+-----+-----------+-------+ +-------+-----------+-----------+------------+------------+ ABI/TBIT oday's ABIT oday's TBIPrevious ABIPrevious TBI +-------+-----------+-----------+------------+------------+ Right 1.03 0.86 1.11   +-------+-----------+-----------+------------+------------+ Left 1.06 0.84 1.27   +-------+-----------+-----------+------------+------------+ Tibial waveforms somewhat difficult to record due to irregular heartbeat. Bilateral ABIs appear essentially unchanged compared to prior study on 06/21/15. Summary: Right: Resting right ankle-brachial index is within normal range. No evidence of significant right lower extremity arterial disease. The right toe-brachial index is normal. Although ankle brachial indices are within normal limits (0.95-1.29),  arterial Doppler waveforms at the ankle suggest some component of arterial occlusive disease. Left: Resting left ankle-brachial index is within normal range. No evidence of significant left lower extremity arterial disease. The left toe-brachial index is normal. Although ankle brachial indices are within  normal limits (0.95-1.29), arterial Doppler waveforms at the ankle suggest some component of arterial occlusive disease. 12/15; the patient's area on the left mid tibia looks better. Right posterior calf also better. He has the area on the left foot as well. All of his toes look better I think this was tinea. We are using Hydrofera Blue everywhere else The patient was in urgent care yesterday with wheezing and shortness of breath. He got azithromycin and prednisone. He feels better. His lungs are currently clear to auscultation. He was not tested for Covid 19 12/29; the patient arrives in clinic today with quite a bit change. 2 weeks ago he only had areas on the left mid tibia right posterior calf with tinea pedis resolving between his toes. He arrives in clinic today with several areas on the dorsal toes on the right, dorsal left second toe. He has skin breakdown in the left medial calf probably from excess edema. Small area proximally in the medial calf. He has weeping edema fluid coming out of the skin excoriations on the left medial calf. He tells me that he is having a cardiac catheterization next week. I had a quick look at [facility] health link. He was found to have an ejection fraction of 25% during the work-up for persistent atrial fibrillation. He saw his cardiology office yesterday seen by the nurse practitioner. She increased his carvedilol. He has not been on diuretics for apparently several months and indeed in the nurse practitioner Dietrich Pates notes she had knowledge of this. 10/04/2019 on evaluation today patient presents as a walk-in visit concerning the fact that he did not have an appointment here for our clinic at this point. He actually had a cardiac catheterization earlier today and then came from there to here in order to be evaluated. With that being said unfortunately he is having significant cellulitis of his left lower extremity upon evaluation today this appears to have  deteriorated even since last week's evaluation with Dr. Dellia Nims. The right lower extremity is actually doing okay I really see no evidence of deterioration at this point at those locations. In fact the right leg seems in general be doing quite well. Nonetheless I am concerned about infection and cellulitis of the left lower extremity and again considering his weakened heart I do not want him to develop into sepsis at all. He is also having some trouble breathing today and I understand according to nursing staff this is always the case to some degree. With that being said the patient unfortunately seems to be in my opinion a little bit worse even his wife feels like that may be the case today. Unsure exactly what is leading to this. Cardiac catheterization I did review the report which showed an ejection fraction of 25% he also had an LAD blockage of around 25% based on what I saw. With that being said there was no significant blockages that required stenting at this point he does have weakened cardiac muscles compared to normal. 10/05/2019; patient was seen yesterday in clinic. He was sent to the ER because of cellulitis of the left leg possibility. In the ER he was given 1 dose of IV Levaquin and discharged on Keflex. He came in the clinic initially for  a nurse visit to rewrap his left leg. We did not look at the right leg today that is an Haematologist. The patient also had a cardiac cath. According to him there were no blockages but a very low ejection fraction. 1/12; back for an early follow-up. The condition of the left lower leg is a lot better although there are multiple open areas. All of them with not a particularly viable surface. On the right posterior calf he has a single wound with a clean surface. He has a wound with exposed bone on the PIP of the left second toe dorsally. He has wounds on the dorsal right first second and third toes. His arterial studies were normal. 1/19; the patient has 3  open wounds on the left leg anteriorly in the mid tibia, distally and medially just above the ankle and posteriorly. On the right he has a small area dime sized on the right posterior calf. His edema control is a lot better. He still has wounds on the right second and left second toes. The left second toe has exposed bone. X-ray I did last week did not show evidence of osteomyelitis in the left foot. 1/26; patient with a multiplicity of wounds and problems. ooOn the left he has a circular area on the left anterior mid tibia ooLeft just above the medical ankle -left 2nd toe pip -right 2nd toe right posterior calf 2/2; patient with a multiplicity of wounds and problems. He has severe chronic venous insufficiency and the wounds on his leg are all on the left left anterior left medial at the medial malleolus and left posterior. We have been using Iodoflex to these areas to help with ongoing debridement. He also has largely traumatic wounds on his toes this includes the left second with exposed bone. Bone culture I did last week showed Staph aureus which is methicillin sensitive. I discussed with him today the idea of an amputation of this toe because it is literally nonfunctional however he wants to try antibiotics. Antibiotic choice is complicated by the fact that he is on Coumadin. He also has wounds over the dorsal part of the right first which is close to closed. Right second toe has exposed bone and the right third toe at the base of the right third toe is just about closed as well. We have been using silver alginate 2/9; patient with severe chronic venous insufficiency. He has large wounds on the left anterior mid tibia and on the left medial just above the ankle. I am concerned today about the depth of the area on the left mid tibia may be exposing into the muscle. He has small areas on the left posterior calf and on the right posterior calf although these do not look as threatening. He also  has traumatic wounds on his toes. The right first and third are closed however the bilateral second toes have exposed bone. The area on the left is open into the distal interphalangeal joint. In my opinion these toes are nonsalvageable and will need amputation 2/16; patient with severe chronic venous insufficiency. He has wounds on the left anterior mid tibia left medial lower leg just above the ankle. He has small areas on the left posterior calf and a more prominent area on the right posterior calf. All of these are related to poorly controlled venous hypertension. He also had traumatic wounds on the PIPs of both second toes. Unfortunately both of these have exposed bone and in the case of the  left this I think goes right into the joint itself. I do not think either 1 of these is salvageable. I have reviewed his arterial evaluation that was done in July last year. He also saw Dr. [name] in June. He felt these were venous stasis. Previously had segmental arterial pressures performed in 11/03/2014 which were normal. He was sent for for a follow-up arterial evaluation on July 1. On the right his ABIs were 1.03 with a TBI of 0.86 although his waveforms were monophasic on the left he had multiphasic waveforms at the ATA PTA but monophasic at the peroneal nevertheless his ABI was normal at 1.06 TBI also normal at 0.84. I think he has enough blood flow to heal an amputation which I think he needs of the left second toe and probably the right second toe as well 2/23; the patient is going for his bilateral second toe amputation by Dr. Amalia Hailey on Friday. In the meantime is 3 areas on the left leg and the small area on the right posterior look better. We have been using silver collagen. 3/2; the patient's toe amputations were canceled because of cardiac concerns. Apparently this surgery will need to be done in the hospital although they do not have an appointment. In the meantime he is continues to have 3 areas on  the left leg one anteriorly and 2 smaller areas posteriorly on the left as well as the small posterior area on the right. We have been using silver collagen with compression 3/9; still no word on the second toe amputations bilaterally. 3 wounds on the left leg all look better and the area on the right posterior we have been using silver collagen I change this to [facility] 3/16; amputations next Monday both second toes he is apparently going to be hospitalized. He has on his left leg including left anterior and left lateral look better very superficial also the right posterior. 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 He continues to have left leg wounds anteriorly x2 posteriorly x1 at the left lower leg just above the ankle anteriorly x1 and posteriorly on the right x1 3/30; his anterior left leg wounds are considerably smaller. In fact the only wound that is not smaller is on the right. We have been using Hydrofera Blue under Kerlix Cobax 4/6; his anterior left leg wounds continue to contract in fact the area distally and medially has closed over. He still has the area anteriorly on the left. Both posterior calf wounds on the left and right are much smaller. We saw his surgical wound at the left second toe amputation site that still sutured it looks healthy, this is being followed by his podiatrist. We went over what compression he has he has bilateral juxta lites I am hopeful the issue here was that we just did not have them tight enough because he broke down very quickly the last time we healed him out. 4/13; the patient has 3 remaining wounds one small area on the left anterior lower leg one on the left posterior and one on the right anterior. We have been using Hydrofera Blue under compression compression all of these are contracting. We did discuss life in the eventuality he will totally  closed which seems likely in the next week or 2. He has bilateral juxta lite stockings. These will need to be set at 30-40 compression. Since we  last closed him over for his bilateral lower extremity wounds he is also been treated for heart failure which may help. He undoubtedly has severe chronic venous hypertension however as well 4/19; the area on the left anterior tibial area is closed. The area on the right posterior calf is just above closed. The area on the left posterior calf has some depth. 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 Everything looks quite good here. He does not need a specific dressing 4/27; the right posterior leg wound is closed there is nothing open on the right. Still has a small punched out area on the left posterior calf. He will be transitioned into the juxta lite stocking on the right today. Still wrapping the left leg 5/4; right leg is still closed although we did not take the juxta lite stocking off. Will do this next week. Small punched out area on the left posterior calf is still not healed we have been using silver collagen under compression 5/11; right leg is still closed although he has dry flaking skin on the dorsal and plantar part of his foot. No real evidence of infection. ooThe one remaining wound on the left posterior calf. This is actually measuring somewhat larger we have been using Hydrofera Blue 02/20/20-Patient returns at 2 weeks, the wound on the posterior calf is about the same, there are 2 new areas on the anterior shin on the left both are very small and seem more like abrasion wounds, there is also a circular area on the posterior aspect of rash which is very very mild 6/1; the patient has a very small area on the left posterior calf and a vulnerable area on the left anterior mid  tibia. The latter is epithelialized but not robust. We have been using PolyMem under compression 6/8; everything is closed on the left leg including the left posterior calf and the previously reported vulnerable area on the left mid tibia. We can graduate on the left leg to his juxta lite stocking they have been familiar with this from use on the right leg for about a month now. The patient has severe chronic venous insufficiency. During this stay in clinic 1 we are dealing with venous wounds on his legs he developed abrasions on the dorsal aspect of both's second toes which subsequently developed osteomyelitis. He had both second toes amputated by Dr. Amalia Hailey. In any case we finally have the venous insufficiency wounds on the left heel with good edema control. Objective Constitutional Vitals Time Taken: 8:49 AM, Height: 74 in, Weight: 212 lbs, BMI: 27.2, Temperature: 98.3 F, Pulse: 87 bpm, Respiratory Rate: 18 breaths/min, Blood Pressure: 123/90 mmHg. Respiratory work of breathing is normal. Cardiovascular Pedal pulses are palpable. General Notes: Wound exam; left posterior calf ulcer has resolved. The area on the left anterior mid tibia that was epithelialized last week but looks vulnerable also looks more mature and stable. We have good edema control. Integumentary (Hair, Skin) Severe chronic venous changes in the left leg with hemosiderin deposition. Wound #25 status is Healed - Epithelialized. Original cause of wound was Gradually Appeared. The wound is located on the Left,Distal,Posterior Lower Leg. The wound measures 0cm length x 0cm width x 0cm depth; 0cm^2 area and 0cm^3 volume. There is no tunneling or undermining noted. There is a none present amount of drainage noted. The  wound margin is flat and intact. There is no granulation within the wound bed. There is no necrotic tissue within the wound bed. Assessment Active Problems ICD-10 Non-pressure chronic ulcer of left calf limited  to breakdown of skin Chronic venous hypertension (idiopathic) with ulcer and inflammation of bilateral lower extremity Type 2 diabetes mellitus with diabetic peripheral angiopathy without gangrene Type 2 diabetes mellitus with diabetic polyneuropathy Plan Discharge From [facility] Services: Discharge from [facility] - patient to apply lotion daily, patient to wear stockings daily, on in the am, off in the pm 1. The patient can be discharged from the wound care center 2. He will lotion his legs every night and use the juxta lite stockings in the morning. 3. Keep legs elevated when he was sitting. 4. During this protracted stay in this clinic with severe bilateral venous wounds he also required amputation of both second toes because of abrasion wounds from footwear that became complicated with osteomyelitis. The amputation sites have healed well 5. He does not have an arterial issue Electronic Signature(s) Signed: 03/06/2020 4:17:30 PM By: [name] MD Entered By: [name] on 03/05/2020 09:39:57 -------------------------------------------------------------------------------- SuperBill Details Patient Name: Date of Service: [name] 03/05/2020 Medical Record Number: [id number] Patient Account Number: [id number] Date of Birth/Sex: Treating RN: October 17, 1940 (78 y.o. [name]) [name] Primary Care Provider: Leanord Asal, SA RA H Other Clinician: Referring Provider: Treating Provider/Extender: Candie Mile, SA RA H Weeks in Treatment: 53 Diagnosis Coding ICD-10 Codes Code Description [id number] Non-pressure chronic ulcer of left calf limited to breakdown of skin I87.333 Chronic venous hypertension (idiopathic) with ulcer and inflammation of bilateral lower extremity E11.51 Type 2 diabetes mellitus with diabetic peripheral angiopathy without gangrene E11.42 Type 2 diabetes mellitus with diabetic polyneuropathy Facility Procedures The patient participates with Medicare or  their insurance follows the Medicare Facility Guidelines: CPT4 Code Description Modifier Quantity [id number] [phone] - WOUND CARE VISIT-LEV 3 EST PT 1 Physician Procedures : CPT4 Code Description Modifier [id number] - WC PHYS LEVEL 3 - EST PT ICD-10 Diagnosis Description L97.221 Non-pressure chronic ulcer of left calf limited to breakdown of skin I87.333 Chronic venous hypertension (idiopathic) with ulcer and  inflammation of bilateral lower extremity Quantity: 1 Electronic Signature(s) Signed: 03/06/2020 4:17:30 PM By: [name] MD Entered By: [name] on 03/05/2020 09:40:19

## 2020-03-07 NOTE — Progress Notes (Signed)
[name] ([id number]) , Visit Report for 03/05/2020 Arrival Information Details Patient Name: Date of Service: [name], [name] 03/05/2020 8:30 A M Medical Record Number: [id number] Patient Account Number: [id number] Date of Birth/Sex: Treating RN: 1940/10/05 (79 y.o. [name] Primary Care Amora Sheehy: Leanord Asal, SA RA H Other Clinician: Referring Keanna Tugwell: Treating Lashe Oliveira/Extender: Candie Mile, SA RA H Weeks in Treatment: 76 Visit Information History Since Last Visit Added or deleted any medications: No Patient Arrived: Wheel Chair Any new allergies or adverse reactions: No Arrival Time: 08:49 Had a fall or experienced change in No Accompanied By: wife activities of daily living that may affect Transfer Assistance: None risk of falls: Patient Identification Verified: Yes Signs or symptoms of abuse/neglect since last visito No Secondary Verification Process Completed: Yes Hospitalized since last visit: No Patient Requires Transmission-Based Precautions: No Implantable device outside of the clinic excluding No Patient Has Alerts: Yes cellular tissue based products placed in the center Patient Alerts: Patient on Blood Thinner since last visit: Has Dressing in Place as Prescribed: Yes Has Compression in Place as Prescribed: Yes Pain Present Now: No Electronic Signature(s) Signed: 03/07/2020 5:18:16 PM By: Levan Hurst RN, BSN Entered By: Levan Hurst on 03/05/2020 08:49:22 -------------------------------------------------------------------------------- Clinic Level of Care Assessment Details Patient Name: Date of Service: Daniel Phenix D. 03/05/2020 8:30 A M Medical Record Number: [id number] Patient Account Number: [id number] Date of Birth/Sex: Treating RN: 03/05/1941 (78 y.o. [name]) [name] Primary Care Ozzy Bohlken: Leanord Asal, SA RA H Other Clinician: Referring Penda Venturi: Treating Dodie Parisi/Extender: Candie Mile, SA RA H Weeks in Treatment:  67 Clinic Level of Care Assessment Items TOOL 4 Quantity Score X- 1 0 Use when only an EandM is performed on FOLLOW-UP visit ASSESSMENTS - Nursing Assessment / Reassessment X- 1 10 Reassessment of Co-morbidities (includes updates in patient status) X- 1 5 Reassessment of Adherence to Treatment Plan ASSESSMENTS - Wound and Skin A ssessment / Reassessment X - Simple Wound Assessment / Reassessment - one wound 1 5 []  - 0 Complex Wound Assessment / Reassessment - multiple wounds []  - 0 Dermatologic / Skin Assessment (not related to wound area) ASSESSMENTS - Focused Assessment []  - 0 Circumferential Edema Measurements - multi extremities []  - 0 Nutritional Assessment / Counseling / Intervention []  - 0 Lower Extremity Assessment (monofilament, tuning fork, pulses) []  - 0 Peripheral Arterial Disease Assessment (using hand held doppler) ASSESSMENTS - Ostomy and/or Continence Assessment and Care []  - 0 Incontinence Assessment and Management []  - 0 Ostomy Care Assessment and Management (repouching, etc.) PROCESS - Coordination of Care X - Simple Patient / Family Education for ongoing care 1 15 []  - 0 Complex (extensive) Patient / Family Education for ongoing care X- 1 10 Staff obtains Programmer, systems, Records, T Results / Process Orders est []  - 0 Staff telephones HHA, Nursing Homes / Clarify orders / etc []  - 0 Routine Transfer to another Facility (non-emergent condition) []  - 0 Routine Hospital Admission (non-emergent condition) []  - 0 New Admissions / Biomedical engineer / Ordering NPWT Apligraf, etc. , []  - 0 Emergency Hospital Admission (emergent condition) X- 1 10 Simple Discharge Coordination []  - 0 Complex (extensive) Discharge Coordination PROCESS - Special Needs []  - 0 Pediatric / Minor Patient Management []  - 0 Isolation Patient Management []  - 0 Hearing / Language / Visual special needs []  - 0 Assessment of Community assistance (transportation, D/C  planning, etc.) []  - 0 Additional assistance / Altered mentation []  - 0 Support  Surface(s) Assessment (bed, cushion, seat, etc.) INTERVENTIONS - Wound Cleansing / Measurement X - Simple Wound Cleansing - one wound 1 5 []  - 0 Complex Wound Cleansing - multiple wounds X- 1 5 Wound Imaging (photographs - any number of wounds) []  - 0 Wound Tracing (instead of photographs) X- 1 5 Simple Wound Measurement - one wound []  - 0 Complex Wound Measurement - multiple wounds INTERVENTIONS - Wound Dressings []  - 0 Small Wound Dressing one or multiple wounds []  - 0 Medium Wound Dressing one or multiple wounds []  - 0 Large Wound Dressing one or multiple wounds X- 1 5 Application of Medications - topical []  - 0 Application of Medications - injection INTERVENTIONS - Miscellaneous []  - 0 External ear exam []  - 0 Specimen Collection (cultures, biopsies, blood, body fluids, etc.) []  - 0 Specimen(s) / Culture(s) sent or taken to Lab for analysis []  - 0 Patient Transfer (multiple staff / Civil Service fast streamer / Similar devices) []  - 0 Simple Staple / Suture removal (25 or less) []  - 0 Complex Staple / Suture removal (26 or more) []  - 0 Hypo / Hyperglycemic Management (close monitor of Blood Glucose) []  - 0 Ankle / Brachial Index (ABI) - do not check if billed separately X- 1 5 Vital Signs Has the patient been seen at the hospital within the last three years: Yes Total Score: 80 Level Of Care: New/Established - Level 3 Electronic Signature(s) Signed: 03/05/2020 4:34:33 PM By: [name] RN Entered By: [name] on 03/05/2020 09:36:39 -------------------------------------------------------------------------------- Encounter Discharge Information Details Patient Name: Date of Service: Daniel Phenix D. 03/05/2020 8:30 A M Medical Record Number: [id number] Patient Account Number: [id number] Date of Birth/Sex: Treating RN: Oct 12, 1940 (78 y.o. [name]) [name] Primary Care Deamonte Sayegh: Leanord Asal, SA RA H  Other Clinician: Referring Waddell Iten: Treating Diella Gillingham/Extender: Candie Mile, SA RA H Weeks in Treatment: 69 Encounter Discharge Information Items Discharge Condition: Stable Ambulatory Status: Ambulatory Discharge Destination: Home Transportation: Private Auto Accompanied By: wife Schedule Follow-up Appointment: Yes Clinical Summary of Care: Patient Declined Electronic Signature(s) Signed: 03/05/2020 4:34:33 PM By: [name] RN Entered By: [name] on 03/05/2020 09:38:02 -------------------------------------------------------------------------------- Lower Extremity Assessment Details Patient Name: Date of Service: [name], [name] 03/05/2020 8:30 A M Medical Record Number: [id number] Patient Account Number: [id number] Date of Birth/Sex: Treating RN: 08/27/1941 (79 y.o. [name] Primary Care Deadrian Toya: Leanord Asal, SA RA H Other Clinician: Referring Renae Mottley: Treating Dulse Rutan/Extender: Candie Mile, SA RA H Weeks in Treatment: 53 Edema Assessment Assessed: [Left: No] [Right: No] Edema: [Left: N] [Right: o] Calf Left: Right: Point of Measurement: 31 cm From Medial Instep 31.6 cm cm Ankle Left: Right: Point of Measurement: 11 cm From Medial Instep 20 cm cm Vascular Assessment Pulses: Dorsalis Pedis Palpable: [Left:Yes] Electronic Signature(s) Signed: 03/07/2020 5:18:16 PM By: Levan Hurst RN, BSN Entered By: Levan Hurst on 03/05/2020 08:57:19 -------------------------------------------------------------------------------- Multi Wound Chart Details Patient Name: Date of Service: Daniel Phenix D. 03/05/2020 8:30 A M Medical Record Number: [id number] Patient Account Number: [id number] Date of Birth/Sex: Treating RN: 28-Mar-1941 (78 y.o. [name]) [name] Primary Care Wyman Meschke: Leanord Asal, SA RA H Other Clinician: Referring Recie Cirrincione: Treating Shannon Balthazar/Extender: Candie Mile, SA RA H Weeks in Treatment: 53 Vital Signs Height(in):  74 Pulse(bpm): 39 Weight(lbs): 212 Blood Pressure(mmHg): 123/90 Body Mass Index(BMI): 27 Temperature(F): 98.3 Respiratory Rate(breaths/min): 18 Photos: [25:No Photos Left, Distal, Posterior Lower Leg] [N/A:N/A N/A] Wound Location: [25:Gradually Appeared] [N/A:N/A] Wounding Event: [25:Venous Leg  Ulcer] [N/A:N/A] Primary Etiology: [25:Diabetic Wound/Ulcer of the Lower] [N/A:N/A] Secondary Etiology: [25:Extremity Cataracts, Hypertension, Peripheral] [N/A:N/A] Comorbid History: [25:Venous Disease, Type II Diabetes, Gout, Received Radiation 10/04/2019] [N/A:N/A] Date Acquired: [25:21] [N/A:N/A] Weeks of Treatment: [25:Healed - Epithelialized] [N/A:N/A] Wound Status: [25:Yes] [N/A:N/A] Clustered Wound: [25:1] [N/A:N/A] Clustered Quantity: [25:0x0x0] [N/A:N/A] Measurements L x W x D (cm) [25:0] [N/A:N/A] A (cm) : rea [25:0] [N/A:N/A] Volume (cm) : [25:100.00%] [N/A:N/A] % Reduction in Area: [25:100.00%] [N/A:N/A] % Reduction in Volume: [25:Full Thickness Without Exposed] [N/A:N/A] Classification: [25:Support Structures None Present] [N/A:N/A] Exudate Amount: [25:Flat and Intact] [N/A:N/A] Wound Margin: [25:None Present (0%)] [N/A:N/A] Granulation Amount: [25:None Present (0%)] [N/A:N/A] Necrotic Amount: [25:Fascia: No] [N/A:N/A] Exposed Structures: [25:Fat Layer (Subcutaneous Tissue) Exposed: No Tendon: No Muscle: No Joint: No Bone: No Large (67-100%)] [N/A:N/A] Treatment Notes Electronic Signature(s) Signed: 03/05/2020 4:34:33 PM By: [name] RN Signed: 03/06/2020 4:17:30 PM By: [name] MD Entered By: [name] on 03/05/2020 09:35:27 -------------------------------------------------------------------------------- Multi-Disciplinary Care Plan Details Patient Name: Date of Service: Daniel Phenix D. 03/05/2020 8:30 A M Medical Record Number: [id number] Patient Account Number: [id number] Date of Birth/Sex: Treating RN: 1941-02-27 (78 y.o. [name] Primary  Care Anothy Bufano: Leanord Asal, SA RA H Other Clinician: Referring Ileene Allie: Treating Connee Ikner/Extender: Candie Mile, SA RA H Weeks in Treatment: 4 Active Inactive Electronic Signature(s) Signed: 03/05/2020 4:34:33 PM By: [name] RN Entered By: [name] on 03/05/2020 09:34:45 -------------------------------------------------------------------------------- Pain Assessment Details Patient Name: Date of Service: [name], Daniel D. 03/05/2020 8:30 A M Medical Record Number: [id number] Patient Account Number: [id number] Date of Birth/Sex: Treating RN: 02-22-1941 (79 y.o. [name] Primary Care Nekhi Liwanag: Leanord Asal, SA RA H Other Clinician: Referring Jashiya Bassett: Treating Tamora Huneke/Extender: Candie Mile, SA RA H Weeks in Treatment: 69 Active Problems Location of Pain Severity and Description of Pain Patient Has Paino No Site Locations Pain Management and Medication Current Pain Management: Electronic Signature(s) Signed: 03/07/2020 5:18:16 PM By: Levan Hurst RN, BSN Entered By: Levan Hurst on 03/05/2020 08:49:42 -------------------------------------------------------------------------------- Patient/Caregiver Education Details Patient Name: Date of Service: [name] 6/8/2021andnbsp8:30 A M Medical Record Number: [id number] Patient Account Number: [id number] Date of Birth/Gender: Treating RN: 01/26/1941 (78 y.o. [name]) [name] Primary Care Physician: Leanord Asal, SA RA H Other Clinician: Referring Physician: Treating Physician/Extender: Candie Mile, SA RA H Weeks in Treatment: 6 Education Assessment Education Provided To: Patient Education Topics Provided Wound/Skin Impairment: Methods: Explain/Verbal Responses: State content correctly Electronic Signature(s) Signed: 03/05/2020 4:34:33 PM By: [name] RN Entered By: [name] on 03/05/2020  09:34:58 -------------------------------------------------------------------------------- Wound Assessment Details Patient Name: Date of Service: [name], Daniel D. 03/05/2020 8:30 A M Medical Record Number: [id number] Patient Account Number: [id number] Date of Birth/Sex: Treating RN: [birth year] (79 y.o. [name] Primary Care Nandini Bogdanski: Leanord Asal, SA RA H Other Clinician: Referring Delanee Xin: Treating Man Effertz/Extender: Candie Mile, SA RA H Weeks in Treatment: 53 Wound Status Wound Number: 25 Primary Venous Leg Ulcer Etiology: Wound Location: Left, Distal, Posterior Lower Leg Secondary Diabetic Wound/Ulcer of the Lower Extremity Wounding Event: Gradually Appeared Etiology: Date Acquired: 10/04/2019 Wound Healed - Epithelialized Weeks Of Treatment: 21 Status: Clustered Wound: Yes Comorbid Cataracts, Hypertension, Peripheral Venous Disease, Type II History: Diabetes, Gout, Received Radiation Photos Photo Uploaded By: Mikeal Hawthorne on 03/06/2020 14:12:41 Wound Measurements Length: (cm) Width: (cm) Depth: (cm) Clustered Quantity: Area: (cm) Volume: (cm) 0 % Reduction in Area: 100% 0 % Reduction in Volume: 100% 0 Epithelialization:  Large (67-100%) 1 Tunneling: No 0 Undermining: No 0 Wound Description Classification: Full Thickness Without Exposed Support Structures Wound Margin: Flat and Intact Exudate Amount: None Present Foul Odor After Cleansing: No Slough/Fibrino No Wound Bed Granulation Amount: None Present (0%) Exposed Structure Necrotic Amount: None Present (0%) Fascia Exposed: No Fat Layer (Subcutaneous Tissue) Exposed: No Tendon Exposed: No Muscle Exposed: No Joint Exposed: No Bone Exposed: No Electronic Signature(s) Signed: 03/05/2020 4:34:33 PM By: [name] RN Signed: 03/07/2020 5:18:16 PM By: Levan Hurst RN, BSN Entered By: [name] on 03/05/2020  09:33:36 -------------------------------------------------------------------------------- Benedict Details Patient Name: Date of Service: Daniel Phenix D. 03/05/2020 8:30 A M Medical Record Number: [id number] Patient Account Number: [id number] Date of Birth/Sex: Treating RN: June 27, 1941 (79 y.o. [name] Primary Care Eppie Barhorst: Leanord Asal, SA RA H Other Clinician: Referring Jairy Angulo: Treating Eudora Guevarra/Extender: Candie Mile, SA RA H Weeks in Treatment: 57 Vital Signs Time Taken: 08:49 Temperature (F): 98.3 Height (in): 74 Pulse (bpm): 87 Weight (lbs): 212 Respiratory Rate (breaths/min): 18 Body Mass Index (BMI): 27.2 Blood Pressure (mmHg): 123/90 Reference Range: 80 - 120 mg / dl Electronic Signature(s) Signed: 03/07/2020 5:18:16 PM By: Levan Hurst RN, BSN Entered By: Levan Hurst on 03/05/2020 08:49:38

## 2020-03-14 ENCOUNTER — Other Ambulatory Visit: Payer: Self-pay

## 2020-03-14 ENCOUNTER — Ambulatory Visit (INDEPENDENT_AMBULATORY_CARE_PROVIDER_SITE_OTHER): Payer: Medicare HMO | Admitting: Emergency Medicine

## 2020-03-14 ENCOUNTER — Ambulatory Visit
Admission: RE | Admit: 2020-03-14 | Discharge: 2020-03-14 | Disposition: A | Discharge status: Home / Self Care | Payer: Medicare HMO | Source: Ambulatory Visit | Attending: Nurse Practitioner | Admitting: Nurse Practitioner

## 2020-03-14 DIAGNOSIS — I429 Cardiomyopathy, unspecified: Secondary | ICD-10-CM

## 2020-03-14 DIAGNOSIS — I700 Atherosclerosis of aorta: Secondary | ICD-10-CM | POA: Diagnosis not present

## 2020-03-14 DIAGNOSIS — T82120A Displacement of cardiac electrode, initial encounter: Secondary | ICD-10-CM

## 2020-03-14 DIAGNOSIS — R21 Rash and other nonspecific skin eruption: Secondary | ICD-10-CM | POA: Diagnosis not present

## 2020-03-14 DIAGNOSIS — I517 Cardiomegaly: Secondary | ICD-10-CM | POA: Diagnosis not present

## 2020-03-14 LAB — CUP PACEART INCLINIC DEVICE CHECK
Battery Remaining Longevity: 63 mo
Battery Voltage: 3.08 V
Brady Statistic AP VP Percent: 0 %
Brady Statistic AP VS Percent: 0 %
Brady Statistic AS VP Percent: 0 %
Brady Statistic AS VS Percent: 0 %
Brady Statistic RA Percent Paced: 0 %
Brady Statistic RV Percent Paced: 80.17 %
Date Time Interrogation Session: 20210617100400
HighPow Impedance: 63 Ohm
Implantable Lead Implant Date: 20210603
Implantable Lead Implant Date: 20210603
Implantable Lead Location: 753858
Implantable Lead Location: 753860
Implantable Lead Model: 4598
Implantable Pulse Generator Implant Date: 20210603
Lead Channel Impedance Value: 171 Ohm
Lead Channel Impedance Value: 191.854
Lead Channel Impedance Value: 191.854
Lead Channel Impedance Value: 191.854
Lead Channel Impedance Value: 218.5 Ohm
Lead Channel Impedance Value: 323 Ohm
Lead Channel Impedance Value: 342 Ohm
Lead Channel Impedance Value: 342 Ohm
Lead Channel Impedance Value: 4047 Ohm
Lead Channel Impedance Value: 437 Ohm
Lead Channel Impedance Value: 437 Ohm
Lead Channel Impedance Value: 456 Ohm
Lead Channel Impedance Value: 513 Ohm
Lead Channel Impedance Value: 627 Ohm
Lead Channel Impedance Value: 646 Ohm
Lead Channel Impedance Value: 665 Ohm
Lead Channel Impedance Value: 703 Ohm
Lead Channel Impedance Value: 722 Ohm
Lead Channel Pacing Threshold Amplitude: 0.75 V
Lead Channel Pacing Threshold Amplitude: 2.75 V
Lead Channel Pacing Threshold Pulse Width: 0.4 ms
Lead Channel Pacing Threshold Pulse Width: 0.4 ms
Lead Channel Sensing Intrinsic Amplitude: 9.5 mV
Lead Channel Setting Pacing Amplitude: 3.5 V
Lead Channel Setting Pacing Amplitude: 5.5 V
Lead Channel Setting Pacing Pulse Width: 0.4 ms
Lead Channel Setting Pacing Pulse Width: 0.4 ms
Lead Channel Setting Sensing Sensitivity: 0.3 mV

## 2020-03-14 NOTE — Patient Instructions (Addendum)
Go to [facility] at [place] , [address] for chest x-ray after this appointment, Call the office when you return home or assistance sending a remote transmission. Scheduler will call you for an appointment with Dr Lovena Le for next week. Call [facility] at [phone] to send transmission and for any questions or concerns.

## 2020-03-14 NOTE — Progress Notes (Signed)
Wound check appointment for CRT-D. Steri-strips removed. Wound without redness or edema. Incision edges approximated, wound well healed. Normal device function. RV  thresholds, sensing, and impedances consistent with implant measurements. LV threshold 2.75 V at 0.83ms. Per Lynnell Jude NP chest x-ray ordered and patient scheduled for f/u with Dr Lovena Le to address CRT-D pacing %.Device programmed at 3.5V in the RV for extra safety margin and LV programmed at 4.75 V at implant for extra safety margin until 3 month visit.  Histogram distribution appropriate for patient and level of activity. No ventricular arrhythmias noted. CRT-P % 80.1% Patient educated about wound care, arm mobility, lifting restrictions, shock plan. ROV with Dr Lovena Le 03/27/20 .Remote transmissions scheduled every 91 days and next remote transmission due 05/29/20.

## 2020-03-20 ENCOUNTER — Ambulatory Visit (INDEPENDENT_AMBULATORY_CARE_PROVIDER_SITE_OTHER): Payer: Medicare HMO | Admitting: *Deleted

## 2020-03-20 ENCOUNTER — Encounter (INDEPENDENT_AMBULATORY_CARE_PROVIDER_SITE_OTHER): Payer: Self-pay | Admitting: Nurse Practitioner

## 2020-03-20 ENCOUNTER — Ambulatory Visit (INDEPENDENT_AMBULATORY_CARE_PROVIDER_SITE_OTHER): Payer: Medicare HMO | Admitting: Nurse Practitioner

## 2020-03-20 ENCOUNTER — Other Ambulatory Visit: Payer: Self-pay

## 2020-03-20 VITALS — BP 136/90 | HR 102 | Temp 97.3°F | Ht 74.5 in | Wt 196.8 lb

## 2020-03-20 DIAGNOSIS — I10 Essential (primary) hypertension: Secondary | ICD-10-CM | POA: Diagnosis not present

## 2020-03-20 DIAGNOSIS — E559 Vitamin D deficiency, unspecified: Secondary | ICD-10-CM | POA: Diagnosis not present

## 2020-03-20 DIAGNOSIS — I4891 Unspecified atrial fibrillation: Secondary | ICD-10-CM | POA: Diagnosis not present

## 2020-03-20 DIAGNOSIS — E119 Type 2 diabetes mellitus without complications: Secondary | ICD-10-CM

## 2020-03-20 DIAGNOSIS — E782 Mixed hyperlipidemia: Secondary | ICD-10-CM | POA: Diagnosis not present

## 2020-03-20 DIAGNOSIS — I4821 Permanent atrial fibrillation: Secondary | ICD-10-CM | POA: Diagnosis not present

## 2020-03-20 DIAGNOSIS — Z5181 Encounter for therapeutic drug level monitoring: Secondary | ICD-10-CM

## 2020-03-20 LAB — POCT INR: INR: 2 (ref 2.0–3.0)

## 2020-03-20 MED ORDER — BLOOD GLUCOSE MONITOR KIT: PACK | 0 refills | Status: DC

## 2020-03-20 NOTE — Patient Instructions (Signed)
Continue taking 1 tablet daily  Recheck in 5 weeks

## 2020-03-20 NOTE — Progress Notes (Signed)
Subjective:  Patient ID: [name], male    DOB: [birth year]  Age: 79 y.o. MRN: [id number]  CC:  Chief Complaint  Patient presents with  . Hypertension  . Diabetes  . Atrial Fibrillation  . Hyperlipidemia  . Other    Vitamin D Deficiency      HPI  This patient arrives today for the above.  He has a history of atrial fibrillation and recently had an ICD placed.  He continues on Coumadin, denies any signs of bleeding.  He has his INR checked regularly by a clinic in [place].  He continues on his antihypertensives and is tolerating these well.  Last A1c was collected in September 2020 at this office it was 6.9.  He continues on glipizide.  He tells me he is not checking his blood sugar at home, in fact he does not have a glucometer available to him.  He denies any signs or symptoms of hypoglycemia in the recent past.  He continues on his statin, he is due for lipid panel checked today.  Last LDL was 132.  He continues on 5000 IUs of vitamin D3 last serum level was 41 back in September 2020.  He has no acute concerns or complaints today   Past Medical History:  Diagnosis Date  . Cancer ([place])   . CHF (congestive heart failure) ([place])   . Chronic atrial fibrillation ([place])   . Chronic bronchitis   . Diabetes mellitus, type II ([place])    no insulin  . Gout   . History of herpes zoster virus   . History of radiation therapy 12/21/12- 02/15/13   prostate 79 [name] in 41 fx  . Hyperlipidemia   . Hypertension   . Prostate cancer ([place]) 2014   EBRT + hormonal therapy  . Vitamin D deficiency disease 06/07/2019      Family History  Problem Relation Age of Onset  . Kidney failure Mother   . Heart attack Father     Social History   Social History Narrative  . Not on file   Social History   Tobacco Use  . Smoking status: Never Smoker  . Smokeless tobacco: Never Used  Substance Use Topics  . Alcohol use: No    Alcohol/week: 0.0 standard drinks     Current Meds    Medication Sig  . acetaminophen (TYLENOL) 500 MG tablet Take 500-1,000 mg by mouth every 6 (six) hours as needed for mild pain or headache.   . Ascorbic Acid 500 MG CAPS Take 1,000 mg by mouth daily.   . cetaphil (CETAPHIL) lotion Apply 1 application topically as needed for dry skin (leg).  . Cholecalciferol (VITAMIN D-3) 125 MCG (5000 UT) TABS Take 5,000 Units by mouth daily.   . furosemide (LASIX) 20 MG tablet Take 20 mg by mouth daily.  Marland Kitchen glipiZIDE (GLUCOTROL) 5 MG tablet TAKE 1 TABLET EVERY DAY (Patient taking differently: Take 5 mg by mouth daily. )  . HYDROcodone-acetaminophen (NORCO/VICODIN) 5-325 MG tablet Take 1 tablet by mouth every 6 (six) hours as needed for moderate pain.  . NON FORMULARY Take 3 tablets by mouth daily. MANGA-CAL  . sacubitril-valsartan (ENTRESTO) 24-26 MG Take 1 tablet by mouth 2 (two) times daily.  . vitamin E 400 UNIT capsule Take 400 Units by mouth daily.   Marland Kitchen warfarin (COUMADIN) 5 MG tablet Take 1 tablet (5 mg total) by mouth daily.    ROS:  Review of Systems  Constitutional: Negative for malaise/fatigue.  Eyes:  Negative for blurred vision.  Respiratory: Negative for shortness of breath.   Cardiovascular: Negative for chest pain.  Gastrointestinal: Negative for abdominal pain, blood in stool and melena.  Neurological: Negative for dizziness and headaches.     Objective:   Today's Vitals: BP 136/90   Pulse (!) 102   Temp (!) 97.3 F (36.3 C) (Temporal)   Ht 6' 2.5" (1.892 m)   Wt 196 lb 12.8 oz (89.3 kg)   SpO2 96%   BMI 24.93 kg/m  Vitals with BMI 03/20/2020 03/20/2020 02/29/2020  Height - 6' 2.5" -  Weight - 196 lbs 13 oz -  BMI - 97.67 -  Systolic [phone]  Diastolic 90 95 79  Pulse - 102 83     Physical Exam Vitals reviewed.  Constitutional:      Appearance: Normal appearance.  HENT:     Head: Normocephalic and atraumatic.  Cardiovascular:     Rate and Rhythm: Normal rate and regular rhythm.  Pulmonary:     Effort: Pulmonary  effort is normal.     Breath sounds: Normal breath sounds.  Musculoskeletal:     Cervical back: Neck supple.  Skin:    General: Skin is warm and dry.  Neurological:     Mental Status: He is alert and oriented to person, place, and time.  Psychiatric:        Mood and Affect: Mood normal.        Behavior: Behavior normal.        Thought Content: Thought content normal.        Judgment: Judgment normal.          Assessment and Plan   1. Essential hypertension   2. Type 2 diabetes mellitus without complication, without long-term current use of insulin ([place])   3. Mixed hyperlipidemia   4. Vitamin D deficiency disease   5. Atrial fibrillation, unspecified type ([place])      Plan: 1. Repeat blood pressure did show that blood pressure improved.  Will hold off on making adjustments to his hypertensive medications at this time, he will continue on what he is currently prescribed.  2.  I will collect blood work today including A1c for further evaluation.  I will also prescribe glucometer and kit so that he can check his blood sugars at home.  I did educate him on signs symptoms of hypoglycemia, and that he should check his blood sugar when this occurs.  I offered to bring him back in 1 month to make sure he knows how to use his tocometer, but he would rather come back in 3 months.  He will be due for foot exam at that time as well.  3.  We will collect lipid panel today, he will continue on his medications as prescribed for now.  4.  We will collect serum vitamin D level he will continue on his prescribed supplement dose for now.  5.  Clinically he appears to be in sinus rhythm, he will continue to follow-up with his cardiologist and have INR checked by clinic in [place].  He is encouraged to let me know if he has any questions or concerns.  Tests ordered Orders Placed This Encounter  Procedures  . Hemoglobin A1c  . Vitamin D, 25-hydroxy  . CMP with eGFR([facility])  . Lipid Panel  .  Microalbumin/Creatinine Ratio, Urine      Meds ordered this encounter  Medications  . blood glucose meter kit and supplies KIT    Sig:  Dispense based on patient and insurance preference. Use daily to check your blood glucose.    Dispense:  1 each    Refill:  0    Order Specific Question:   Supervising Provider    Answer:   Doree Albee [[id number]]    Order Specific Question:   Number of strips    Answer:   100    Order Specific Question:   Number of lancets    Answer:   100    Patient to follow-up in 3 months or sooner as needed  Ailene Ards, NP

## 2020-03-20 NOTE — Patient Instructions (Signed)
Bring your glucometer with you to your next appointment

## 2020-03-21 LAB — COMPLETE METABOLIC PANEL WITH GFR
AG Ratio: 1.6 (calc) (ref 1.0–2.5)
ALT: 18 U/L (ref 9–46)
AST: 23 U/L (ref 10–35)
Albumin: 4.5 g/dL (ref 3.6–5.1)
Alkaline phosphatase (APISO): 77 U/L (ref 35–144)
BUN: 24 mg/dL (ref 7–25)
CO2: 25 mmol/L (ref 20–32)
Calcium: 9.9 mg/dL (ref 8.6–10.3)
Chloride: 103 mmol/L (ref 98–110)
Creat: 1.08 mg/dL (ref 0.70–1.18)
GFR, Est African American: 76 mL/min/{1.73_m2} (ref >=60)
GFR, Est Non African American: 66 mL/min/{1.73_m2} (ref >=60)
Globulin: 2.8 g/dL (calc) (ref 1.9–3.7)
Glucose, Bld: 114 mg/dL — ABNORMAL HIGH (ref 65–99)
Potassium: 4.9 mmol/L (ref 3.5–5.3)
Sodium: 139 mmol/L (ref 135–146)
Total Bilirubin: 0.6 mg/dL (ref 0.2–1.2)
Total Protein: 7.3 g/dL (ref 6.1–8.1)

## 2020-03-21 LAB — LIPID PANEL
Cholesterol: 202 mg/dL — ABNORMAL HIGH (ref <200)
HDL: 51 mg/dL (ref >=40)
LDL Cholesterol (Calc): 129 mg/dL (calc) — ABNORMAL HIGH
Non-HDL Cholesterol (Calc): 151 mg/dL (calc) — ABNORMAL HIGH (ref <130)
Total CHOL/HDL Ratio: 4 (calc) (ref <5.0)
Triglycerides: 116 mg/dL (ref <150)

## 2020-03-21 LAB — MICROALBUMIN / CREATININE URINE RATIO
Creatinine, Urine: 81 mg/dL (ref 20–320)
Microalb Creat Ratio: 156 mcg/mg creat — ABNORMAL HIGH (ref <30)
Microalb, Ur: 12.6 mg/dL

## 2020-03-21 LAB — HEMOGLOBIN A1C
Hgb A1c MFr Bld: 6.1 % of total Hgb — ABNORMAL HIGH (ref <5.7)
Mean Plasma Glucose: 128 (calc)
eAG (mmol/L): 7.1 (calc)

## 2020-03-21 LAB — VITAMIN D 25 HYDROXY (VIT D DEFICIENCY, FRACTURES): Vit D, 25-Hydroxy: 41 ng/mL (ref 30–100)

## 2020-03-22 ENCOUNTER — Other Ambulatory Visit: Payer: Self-pay | Admitting: *Deleted

## 2020-03-22 MED ORDER — SPIRONOLACTONE 25 MG PO TABS: 25.0000 mg | ORAL_TABLET | Freq: Every day | ORAL | 3 refills | Status: DC

## 2020-03-27 ENCOUNTER — Ambulatory Visit (INDEPENDENT_AMBULATORY_CARE_PROVIDER_SITE_OTHER): Payer: Medicare HMO | Admitting: Internal Medicine

## 2020-03-27 ENCOUNTER — Other Ambulatory Visit: Payer: Self-pay

## 2020-03-27 VITALS — BP 105/71 | HR 89 | Ht 74.5 in | Wt 194.0 lb

## 2020-03-27 DIAGNOSIS — Z95810 Presence of automatic (implantable) cardiac defibrillator: Secondary | ICD-10-CM | POA: Diagnosis not present

## 2020-03-27 DIAGNOSIS — I447 Left bundle-branch block, unspecified: Secondary | ICD-10-CM

## 2020-03-27 DIAGNOSIS — I429 Cardiomyopathy, unspecified: Secondary | ICD-10-CM

## 2020-03-27 NOTE — Patient Instructions (Signed)
Medication Instructions:  °Your physician recommends that you continue on your current medications as directed. Please refer to the Current Medication list given to you today. ° °*If you need a refill on your cardiac medications before your next appointment, please call your pharmacy* ° ° °Lab Work: °NONE  ° °If you have labs (blood work) drawn today and your tests are completely normal, you will receive your results only by: °• MyChart Message (if you have MyChart) OR °• A paper copy in the mail °If you have any lab test that is abnormal or we need to change your treatment, we will call you to review the results. ° ° °Testing/Procedures: °NONE  ° ° °Follow-Up: °At [facility], you and your health needs are our priority.  As part of our continuing mission to provide you with exceptional heart care, we have created designated Provider Care Teams.  These Care Teams include your primary Cardiologist (physician) and Advanced Practice Providers (APPs -  Physician Assistants and Nurse Practitioners) who all work together to provide you with the care you need, when you need it. ° °We recommend signing up for the patient portal called "MyChart".  Sign up information is provided on this After Visit Summary.  MyChart is used to connect with patients for Virtual Visits (Telemedicine).  Patients are able to view lab/test results, encounter notes, upcoming appointments, etc.  Non-urgent messages can be sent to your provider as well.   °To learn more about what you can do with MyChart, go to [email or web address]://[email or web address].com.   ° °Your next appointment:   °2 month(s) ° °The format for your next appointment:   °In Person ° °Provider:   °Gregg Taylor, MD ° ° °Other Instructions °Thank you for choosing [facility]! ° ° ° °

## 2020-03-27 NOTE — Progress Notes (Signed)
HPI [name] returns today for followup. He is a pleasant 79 yo man with chronic systolic heart failure and LBBB, s/p biv ICD insertion. He was found at wound check to have an elevated pacing threshold. He did have some diaphragmatic stim and his CXR demonstrates micro dislodgement. He denies chest pain or sob.  Allergies  Allergen Reactions  . Clarithromycin Other (See Comments)    "aches & pains all over", no appetite, sleepy  . Fluoride Preparations Other (See Comments)    unknown  . Toprol Xl [Metoprolol Tartrate]     Worsening Dyspnea with this in 08/2018     Current Outpatient Medications  Medication Sig Dispense Refill  . acetaminophen (TYLENOL) 500 MG tablet Take 500-1,000 mg by mouth every 6 (six) hours as needed for mild pain or headache.     . Ascorbic Acid 500 MG CAPS Take 1,000 mg by mouth daily.     . blood glucose meter kit and supplies KIT Dispense based on patient and insurance preference. Use daily to check your blood glucose. 1 each 0  . cetaphil (CETAPHIL) lotion Apply 1 application topically as needed for dry skin (leg).    . Cholecalciferol (VITAMIN D-3) 125 MCG (5000 UT) TABS Take 5,000 Units by mouth daily.     . furosemide (LASIX) 20 MG tablet Take 20 mg by mouth daily.    Marland Kitchen glipiZIDE (GLUCOTROL) 5 MG tablet TAKE 1 TABLET EVERY DAY (Patient taking differently: Take 5 mg by mouth daily. ) 90 tablet 0  . MANGANESE PO Take by mouth in the morning, at noon, and at bedtime.    . NON FORMULARY Take 3 tablets by mouth daily. MANGA-CAL    . sacubitril-valsartan (ENTRESTO) 24-26 MG Take 1 tablet by mouth 2 (two) times daily. 60 tablet 11  . spironolactone (ALDACTONE) 25 MG tablet Take 1 tablet (25 mg total) by mouth daily. 90 tablet 3  . vitamin E 400 UNIT capsule Take 400 Units by mouth daily.     Marland Kitchen warfarin (COUMADIN) 5 MG tablet Take 1 tablet (5 mg total) by mouth daily. 90 tablet 0  . allopurinol (ZYLOPRIM) 300 MG tablet Take 1 tablet (300 mg total) by  mouth 2 (two) times daily. 180 tablet 0  . carvedilol (COREG) 25 MG tablet Take 1.5 tablets (37.5 mg total) by mouth 2 (two) times daily. 270 tablet 3  . lovastatin (MEVACOR) 40 MG tablet Take 1 tablet (40 mg total) by mouth at bedtime. (Patient taking differently: Take 20 mg by mouth at bedtime. ) 90 tablet 1   No current facility-administered medications for this visit.     Past Medical History:  Diagnosis Date  . Cancer ([place])   . CHF (congestive heart failure) ([place])   . Chronic atrial fibrillation ([place])   . Chronic bronchitis   . Diabetes mellitus, type II ([place])    no insulin  . Gout   . History of herpes zoster virus   . History of radiation therapy 12/21/12- 02/15/13   prostate 78 gray in 40 fx  . Hyperlipidemia   . Hypertension   . Prostate cancer ([place]) 2014   EBRT + hormonal therapy  . Vitamin D deficiency disease 06/07/2019    ROS:   All systems reviewed and negative except as noted in the HPI.   Past Surgical History:  Procedure Laterality Date  . AMPUTATION Bilateral 12/18/2019   Procedure: BILATERAL 2ND TOE AMPUTATION DIGIT;  Surgeon: Edrick Kins, DPM;  Location:  [facility];  Service: Podiatry;  Laterality: Bilateral;  . BIV ICD INSERTION CRT-D N/A 02/29/2020   Procedure: BIV ICD INSERTION CRT-D;  Surgeon: Evans Lance, MD;  Location: [facility];  Service: Cardiovascular;  Laterality: N/A;  . CATARACT EXTRACTION, BILATERAL    . KNEE SURGERY     left knee  . PROSTATE BIOPSY    . RIGHT/LEFT HEART CATH AND CORONARY ANGIOGRAPHY N/A 10/04/2019   Procedure: RIGHT/LEFT HEART CATH AND CORONARY ANGIOGRAPHY;  Surgeon: Belva Crome, MD;  Location: [facility];  Service: Cardiovascular;  Laterality: N/A;     Family History  Problem Relation Age of Onset  . Kidney failure Mother   . Heart attack Father      Social History   Socioeconomic History  . Marital status: Married    Spouse name: Not on file  . Number of children: Not on file  . Years of  education: Not on file  . Highest education level: Not on file  Occupational History  . Not on file  Tobacco Use  . Smoking status: Never Smoker  . Smokeless tobacco: Never Used  Vaping Use  . Vaping Use: Never used  Substance and Sexual Activity  . Alcohol use: No    Alcohol/week: 0.0 standard drinks  . Drug use: No  . Sexual activity: Not on file  Other Topics Concern  . Not on file  Social History Narrative  . Not on file   Social Determinants of Health   Financial Resource Strain:   . Difficulty of Paying Living Expenses:   Food Insecurity:   . Worried About Charity fundraiser in the Last Year:   . Arboriculturist in the Last Year:   Transportation Needs:   . Film/video editor (Medical):   Marland Kitchen Lack of Transportation (Non-Medical):   Physical Activity:   . Days of Exercise per Week:   . Minutes of Exercise per Session:   Stress:   . Feeling of Stress :   Social Connections:   . Frequency of Communication with Friends and Family:   . Frequency of Social Gatherings with Friends and Family:   . Attends Religious Services:   . Active Member of Clubs or Organizations:   . Attends Archivist Meetings:   Marland Kitchen Marital Status:   Intimate Partner Violence:   . Fear of Current or Ex-Partner:   . Emotionally Abused:   Marland Kitchen Physically Abused:   . Sexually Abused:      BP 105/71   Pulse 89   Ht 6' 2.5" (1.892 m)   Wt 194 lb (88 kg)   BMI 24.57 kg/m   Physical Exam:  Well appearing NAD HEENT: Unremarkable Neck:  No JVD, no thyromegally Lymphatics:  No adenopathy Back:  No CVA tenderness Lungs:  Clear with no wheezes HEART:  Regular rate rhythm, no murmurs, no rubs, no clicks Abd:  soft, positive bowel sounds, no organomegally, no rebound, no guarding Ext:  2 plus pulses, no edema, no cyanosis, no clubbing Skin:  No rashes no nodules Neuro:  CN II through XII intact, motor grossly intact  EKG - none  DEVICE  Normal device function.  See PaceArt for  details. His device has been reprogrammed  Assess/Plan: 1. ICD - he has an elevated LV threshold and his device has been reprogrammed to LV 4-coil with satisfactory safety margin. No diaghragmatic stimulation. 2. Chronic systolic heart failure - he has class 2 symptoms.  3.  HTN -his bp is stable. No change.   [name].D

## 2020-03-28 ENCOUNTER — Other Ambulatory Visit: Payer: Self-pay | Admitting: Physician Assistant

## 2020-03-29 ENCOUNTER — Other Ambulatory Visit: Payer: Self-pay

## 2020-03-29 MED ORDER — FUROSEMIDE 20 MG PO TABS: 20.0000 mg | ORAL_TABLET | Freq: Every day | ORAL | 3 refills | Status: DC

## 2020-03-29 MED ORDER — SPIRONOLACTONE 25 MG PO TABS: 25.0000 mg | ORAL_TABLET | Freq: Every day | ORAL | 3 refills | Status: DC

## 2020-03-29 MED ORDER — CARVEDILOL 25 MG PO TABS: 37.5000 mg | ORAL_TABLET | Freq: Two times a day (BID) | ORAL | 3 refills | Status: DC

## 2020-03-29 NOTE — Telephone Encounter (Signed)
Refilled lasix,coreg, spironolactone

## 2020-03-29 NOTE — Telephone Encounter (Signed)
This is a  pt.  °

## 2020-04-05 ENCOUNTER — Other Ambulatory Visit (INDEPENDENT_AMBULATORY_CARE_PROVIDER_SITE_OTHER): Payer: Self-pay | Admitting: Internal Medicine

## 2020-04-08 ENCOUNTER — Other Ambulatory Visit (INDEPENDENT_AMBULATORY_CARE_PROVIDER_SITE_OTHER): Payer: Self-pay | Admitting: Internal Medicine

## 2020-04-08 DIAGNOSIS — M1A0310 Idiopathic chronic gout, right wrist, without tophus (tophi): Secondary | ICD-10-CM

## 2020-04-24 ENCOUNTER — Ambulatory Visit (INDEPENDENT_AMBULATORY_CARE_PROVIDER_SITE_OTHER): Payer: Medicare HMO | Admitting: *Deleted

## 2020-04-24 DIAGNOSIS — Z5181 Encounter for therapeutic drug level monitoring: Secondary | ICD-10-CM | POA: Diagnosis not present

## 2020-04-24 DIAGNOSIS — I4821 Permanent atrial fibrillation: Secondary | ICD-10-CM

## 2020-04-24 LAB — POCT INR: INR: 2.1 (ref 2.0–3.0)

## 2020-04-24 NOTE — Patient Instructions (Signed)
Continue taking 1 tablet daily.  Re-check in 6 weeks. ?

## 2020-05-17 ENCOUNTER — Other Ambulatory Visit (INDEPENDENT_AMBULATORY_CARE_PROVIDER_SITE_OTHER): Payer: Self-pay | Admitting: Nurse Practitioner

## 2020-05-17 DIAGNOSIS — E119 Type 2 diabetes mellitus without complications: Secondary | ICD-10-CM

## 2020-05-30 ENCOUNTER — Ambulatory Visit (INDEPENDENT_AMBULATORY_CARE_PROVIDER_SITE_OTHER): Payer: Medicare HMO | Admitting: *Deleted

## 2020-05-30 DIAGNOSIS — I429 Cardiomyopathy, unspecified: Secondary | ICD-10-CM | POA: Diagnosis not present

## 2020-05-30 DIAGNOSIS — I5022 Chronic systolic (congestive) heart failure: Secondary | ICD-10-CM

## 2020-06-01 LAB — CUP PACEART REMOTE DEVICE CHECK
Battery Remaining Longevity: 80 mo
Battery Voltage: 3.02 V
Brady Statistic AP VP Percent: 0 %
Brady Statistic AP VS Percent: 0 %
Brady Statistic AS VP Percent: 0 %
Brady Statistic AS VS Percent: 0 %
Brady Statistic RA Percent Paced: 0 %
Brady Statistic RV Percent Paced: 85.09 %
Date Time Interrogation Session: 20210902033323
HighPow Impedance: 67 Ohm
Implantable Lead Implant Date: 20210603
Implantable Lead Implant Date: 20210603
Implantable Lead Location: 753858
Implantable Lead Location: 753860
Implantable Lead Model: 4598
Implantable Pulse Generator Implant Date: 20210603
Lead Channel Impedance Value: 168.889
Lead Channel Impedance Value: 179.282
Lead Channel Impedance Value: 193.455
Lead Channel Impedance Value: 221.667
Lead Channel Impedance Value: 239.922
Lead Channel Impedance Value: 304 Ohm
Lead Channel Impedance Value: 323 Ohm
Lead Channel Impedance Value: 380 Ohm
Lead Channel Impedance Value: 399 Ohm
Lead Channel Impedance Value: 4047 Ohm
Lead Channel Impedance Value: 437 Ohm
Lead Channel Impedance Value: 513 Ohm
Lead Channel Impedance Value: 532 Ohm
Lead Channel Impedance Value: 627 Ohm
Lead Channel Impedance Value: 627 Ohm
Lead Channel Impedance Value: 779 Ohm
Lead Channel Impedance Value: 855 Ohm
Lead Channel Impedance Value: 855 Ohm
Lead Channel Pacing Threshold Amplitude: 0.5 V
Lead Channel Pacing Threshold Amplitude: 1.25 V
Lead Channel Pacing Threshold Pulse Width: 0.4 ms
Lead Channel Pacing Threshold Pulse Width: 0.8 ms
Lead Channel Sensing Intrinsic Amplitude: 7.125 mV
Lead Channel Sensing Intrinsic Amplitude: 7.125 mV
Lead Channel Setting Pacing Amplitude: 1.75 V
Lead Channel Setting Pacing Amplitude: 3.5 V
Lead Channel Setting Pacing Pulse Width: 0.4 ms
Lead Channel Setting Pacing Pulse Width: 0.8 ms
Lead Channel Setting Sensing Sensitivity: 0.3 mV

## 2020-06-04 ENCOUNTER — Telehealth: Payer: Self-pay

## 2020-06-04 NOTE — Telephone Encounter (Signed)
Scheduled remote 05/30/20   Noted increased increased Opti-vol, decreased thoracic impedance since beginning of august. Per med list Lasix 20 mg daily, Entresto 24-26 mg BID, Aldactone 25 mg daily.   Patient called, denies any lower leg edema, shortness of breath or any other related symptoms. Patient he has been feeling great and compliant with all medications.   Advised patient I will forward to [facility] clinic for review and recommendations. Patient agreeable to plan.

## 2020-06-04 NOTE — Progress Notes (Signed)
Remote ICD transmission.   

## 2020-06-05 ENCOUNTER — Other Ambulatory Visit: Payer: Self-pay

## 2020-06-05 ENCOUNTER — Ambulatory Visit (INDEPENDENT_AMBULATORY_CARE_PROVIDER_SITE_OTHER): Payer: Medicare HMO | Admitting: Pharmacist

## 2020-06-05 ENCOUNTER — Ambulatory Visit: Payer: Medicare HMO | Admitting: Cardiovascular Disease

## 2020-06-05 DIAGNOSIS — I4821 Permanent atrial fibrillation: Secondary | ICD-10-CM

## 2020-06-05 DIAGNOSIS — Z5181 Encounter for therapeutic drug level monitoring: Secondary | ICD-10-CM

## 2020-06-05 LAB — POCT INR: INR: 1.3 — AB (ref 2.0–3.0)

## 2020-06-05 NOTE — Patient Instructions (Addendum)
Description   Take 2 tablets today and 1 and 1/2 tablets tomorrow then continue warfarin 5 mg daily.  Recheck in 2 weeks

## 2020-06-06 ENCOUNTER — Telehealth: Payer: Self-pay | Admitting: Student

## 2020-06-06 ENCOUNTER — Encounter: Payer: Medicare HMO | Admitting: Internal Medicine

## 2020-06-06 NOTE — Telephone Encounter (Signed)
Ms Nuon states Mr.Viera has a lot of fluid and was told by [facility] Nurse to get an earlier appt.    Please call [phone]    Thanks renee  (Please advise. Nothing for APP sooner than 06-19-20)

## 2020-06-06 NOTE — Telephone Encounter (Signed)
Spoke with wife who is currently at work and does not get off until [employer]. Wife will weigh pt in the morning and call with updated weight.

## 2020-06-06 NOTE — Telephone Encounter (Signed)
Per Bernerd Pho, this was address by Dr. Domenic Polite today via phone call. States patient will be reassigned with Dr. Domenic Polite or Dr. Harl Bowie.

## 2020-06-06 NOTE — Telephone Encounter (Signed)
T[place]ania S, PA is out of the office this week. Dr. Harl Bowie or Domenic Polite are covering. Called and spoke to patients wife giving her the phone number to [facility] in [place]. Advised to make apt about follow-up per L. Dorene Ar, NP.

## 2020-06-06 NOTE — Telephone Encounter (Signed)
I am not familiar with this patient, reviewed the chart and recent telephone communications.  It looks like what has happened is that he had a device interrogation that showed an abnormal thoracic impedance suggesting fluid accumulation since early August.  It sounds like per discussion with his wife however that he is not having any significant edema or shortness of breath.  Please verify that he has not had an increase in weight over the course of the last few weeks.  If his weight has gone up 2 to 3 pounds over prior baseline, would suggest doubling Lasix for the next 2 to 3 days.  If his weight is stable, I would continue with current course and keep scheduled follow-up with Dr. Lovena Le.

## 2020-06-06 NOTE — Telephone Encounter (Signed)
Pt wife calling and requesting that pt be seen sooner that scheduled 06/11/20 with Dr. Lovena Le. Wife states that she was contacted by Roma Kayser, RN and encouraged to scheduled an earlier appt., d/t increased fluid levels. Wife states that pt is doing well with no swelling, or SOB noted at this time. See phone note from 06/04/20.  Please advise.

## 2020-06-07 NOTE — Telephone Encounter (Signed)
Spoke with wife who states that current weight is 197.6 lbs and last weight was 196.6. She will continue with current course and keep follow up with Dr. Lovena Le.

## 2020-06-11 ENCOUNTER — Other Ambulatory Visit: Payer: Self-pay

## 2020-06-11 ENCOUNTER — Encounter: Payer: Self-pay | Admitting: Internal Medicine

## 2020-06-11 ENCOUNTER — Ambulatory Visit (INDEPENDENT_AMBULATORY_CARE_PROVIDER_SITE_OTHER): Payer: Medicare HMO | Admitting: Internal Medicine

## 2020-06-11 VITALS — BP 114/64 | HR 54 | Ht 74.5 in | Wt 195.8 lb

## 2020-06-11 DIAGNOSIS — Z23 Encounter for immunization: Secondary | ICD-10-CM

## 2020-06-11 DIAGNOSIS — Z79899 Other long term (current) drug therapy: Secondary | ICD-10-CM

## 2020-06-11 MED ORDER — DIGOXIN 125 MCG PO TABS
0.1250 mg | ORAL_TABLET | Freq: Every day | ORAL | 3 refills | Status: DC
Start: 2020-06-11 — End: 2020-08-06

## 2020-06-11 NOTE — Progress Notes (Signed)
HPI [name] returns today for followup. He is a pleasant 79 yo man with a h/o chronic systolic heart failure, chronic atrial fib, s/p biv ICD insertion. He underwent lead revision 3 months ago. He has done fairly well but recently had an increase in his HR and fluid. He admits to some dietary indiscretion.  Allergies  Allergen Reactions  . Clarithromycin Other (See Comments)    "aches & pains all over", no appetite, sleepy  . Fluoride Preparations Other (See Comments)    unknown  . Toprol Xl [Metoprolol Tartrate]     Worsening Dyspnea with this in 08/2018     Current Outpatient Medications  Medication Sig Dispense Refill  . acetaminophen (TYLENOL) 500 MG tablet Take 500-1,000 mg by mouth every 6 (six) hours as needed for mild pain or headache.     . allopurinol (ZYLOPRIM) 300 MG tablet TAKE 1 TABLET EVERY DAY 90 tablet 1  . Ascorbic Acid 500 MG CAPS Take 1,000 mg by mouth daily.     . Blood Glucose Monitoring Suppl (TRUE METRIX METER) w/Device KIT USE AS DIRECTED 1 kit 0  . carvedilol (COREG) 25 MG tablet Take 1.5 tablets (37.5 mg total) by mouth 2 (two) times daily. 270 tablet 3  . cetaphil (CETAPHIL) lotion Apply 1 application topically as needed for dry skin (leg).    . Cholecalciferol (VITAMIN D-3) 125 MCG (5000 UT) TABS Take 5,000 Units by mouth daily.     Marland Kitchen ENTRESTO 24-26 MG Take 1 tablet by mouth twice daily 60 tablet 6  . furosemide (LASIX) 20 MG tablet Take 1 tablet (20 mg total) by mouth daily. 90 tablet 3  . glipiZIDE (GLUCOTROL) 5 MG tablet TAKE 1 TABLET EVERY DAY 90 tablet 0  . lovastatin (MEVACOR) 40 MG tablet TAKE 1 TABLET AT BEDTIME 90 tablet 1  . MANGANESE PO Take by mouth in the morning, at noon, and at bedtime.    . NON FORMULARY Take 3 tablets by mouth daily. MANGA-CAL    . spironolactone (ALDACTONE) 25 MG tablet Take 1 tablet (25 mg total) by mouth daily. 90 tablet 3  . TRUE METRIX BLOOD GLUCOSE TEST test strip TEST BLOOD SUGAR EVERY DAY 100 strip 3  .  TRUEplus Lancets 33G MISC TEST BLOOD SUGAR EVERY DAY 100 each 3  . vitamin E 400 UNIT capsule Take 400 Units by mouth daily.     Marland Kitchen warfarin (COUMADIN) 5 MG tablet TAKE 1/2 TABLET ON SATURDAYS AND TAKE 1 TABLET ALL OTHER DAYS 85 tablet 1   No current facility-administered medications for this visit.     Past Medical History:  Diagnosis Date  . Cancer ([place])   . CHF (congestive heart failure) ([place])   . Chronic atrial fibrillation ([place])   . Chronic bronchitis   . Diabetes mellitus, type II ([place])    no insulin  . Gout   . History of herpes zoster virus   . History of radiation therapy 12/21/12- 02/15/13   prostate 78 gray in 40 fx  . Hyperlipidemia   . Hypertension   . Prostate cancer ([place]) 2014   EBRT + hormonal therapy  . Vitamin D deficiency disease 06/07/2019    ROS:   All systems reviewed and negative except as noted in the HPI.   Past Surgical History:  Procedure Laterality Date  . AMPUTATION Bilateral 12/18/2019   Procedure: BILATERAL 2ND TOE AMPUTATION DIGIT;  Surgeon: Edrick Kins, DPM;  Location: [facility];  Service: Podiatry;  Laterality: Bilateral;  . BIV ICD INSERTION CRT-D N/A 02/29/2020   Procedure: BIV ICD INSERTION CRT-D;  Surgeon: Evans Lance, MD;  Location: [facility];  Service: Cardiovascular;  Laterality: N/A;  . CATARACT EXTRACTION, BILATERAL    . KNEE SURGERY     left knee  . PROSTATE BIOPSY    . RIGHT/LEFT HEART CATH AND CORONARY ANGIOGRAPHY N/A 10/04/2019   Procedure: RIGHT/LEFT HEART CATH AND CORONARY ANGIOGRAPHY;  Surgeon: Belva Crome, MD;  Location: [facility];  Service: Cardiovascular;  Laterality: N/A;     Family History  Problem Relation Age of Onset  . Kidney failure Mother   . Heart attack Father      Social History   Socioeconomic History  . Marital status: Married    Spouse name: Not on file  . Number of children: Not on file  . Years of education: Not on file  . Highest education level: Not on file  Occupational  History  . Not on file  Tobacco Use  . Smoking status: Never Smoker  . Smokeless tobacco: Never Used  Vaping Use  . Vaping Use: Never used  Substance and Sexual Activity  . Alcohol use: No    Alcohol/week: 0.0 standard drinks  . Drug use: No  . Sexual activity: Not on file  Other Topics Concern  . Not on file  Social History Narrative  . Not on file   Social Determinants of Health   Financial Resource Strain:   . Difficulty of Paying Living Expenses: Not on file  Food Insecurity:   . Worried About Charity fundraiser in the Last Year: Not on file  . Ran Out of Food in the Last Year: Not on file  Transportation Needs:   . Lack of Transportation (Medical): Not on file  . Lack of Transportation (Non-Medical): Not on file  Physical Activity:   . Days of Exercise per Week: Not on file  . Minutes of Exercise per Session: Not on file  Stress:   . Feeling of Stress : Not on file  Social Connections:   . Frequency of Communication with Friends and Family: Not on file  . Frequency of Social Gatherings with Friends and Family: Not on file  . Attends Religious Services: Not on file  . Active Member of Clubs or Organizations: Not on file  . Attends Archivist Meetings: Not on file  . Marital Status: Not on file  Intimate Partner Violence:   . Fear of Current or Ex-Partner: Not on file  . Emotionally Abused: Not on file  . Physically Abused: Not on file  . Sexually Abused: Not on file     BP 114/64   Pulse (!) 54   Ht 6' 2.5" (1.892 m)   Wt 195 lb 12.8 oz (88.8 kg)   SpO2 95%   BMI 24.80 kg/m   Physical Exam:  Well appearing 80 yo man, NAD HEENT: Unremarkable Neck:  6 cm JVD, no thyromegally Lymphatics:  No adenopathy Back:  No CVA tenderness Lungs:  Clear with no wheezes HEART:  Regular rate rhythm, no murmurs, no rubs, no clicks Abd:  soft, positive bowel sounds, no organomegally, no rebound, no guarding Ext:  2 plus pulses, no edema, no cyanosis, no  clubbing Skin:  No rashes no nodules Neuro:  CN II through XII intact, motor grossly intact   DEVICE  Normal device function.  See PaceArt for details.   Assess/Plan: 1. Uncontrolled atrial fib -  his VR is increased. I have asked him to start digoxin 0.125 mg daily and check a digoxin level in 3 weeks. 2. Chronic systolic heart failure - he has class 2B symptoms. I encouraged him to avoid fast food. "You mean [employer] has a lot of salt." 3. ICD - his medtronic biv ICD is working normally. 4. HTN -his bp is well controlled on high dose beta blocker therapy.  Daniel Overlie Justis Closser,MD

## 2020-06-11 NOTE — Patient Instructions (Signed)
Medication Instructions:  Your physician has recommended you make the following change in your medication:  Start Digoxin 0.125 mg   *If you need a refill on your cardiac medications before your next appointment, please call your pharmacy*   Lab Work: Your physician recommends that you return for lab work in: 3 weeks   If you have labs (blood work) drawn today and your tests are completely normal, you will receive your results only by: Marland Kitchen MyChart Message (if you have MyChart) OR . A paper copy in the mail If you have any lab test that is abnormal or we need to change your treatment, we will call you to review the results.   Testing/Procedures: NONE    Follow-Up: At [facility], you and your health needs are our priority.  As part of our continuing mission to provide you with exceptional heart care, we have created designated Provider Care Teams.  These Care Teams include your primary Cardiologist (physician) and Advanced Practice Providers (APPs -  Physician Assistants and Nurse Practitioners) who all work together to provide you with the care you need, when you need it.  We recommend signing up for the patient portal called "MyChart".  Sign up information is provided on this After Visit Summary.  MyChart is used to connect with patients for Virtual Visits (Telemedicine).  Patients are able to view lab/test results, encounter notes, upcoming appointments, etc.  Non-urgent messages can be sent to your provider as well.   To learn more about what you can do with MyChart, go to [email or web address].    Your next appointment:   1 year(s)  The format for your next appointment:   In Person  Provider:   Cristopher Peru, MD   Other Instructions Thank you for choosing [facility]!

## 2020-06-12 ENCOUNTER — Ambulatory Visit: Payer: Medicare HMO | Admitting: Student

## 2020-06-17 ENCOUNTER — Telehealth: Payer: Self-pay

## 2020-06-17 ENCOUNTER — Other Ambulatory Visit (INDEPENDENT_AMBULATORY_CARE_PROVIDER_SITE_OTHER): Payer: Self-pay | Admitting: Internal Medicine

## 2020-06-17 NOTE — Telephone Encounter (Signed)
Pt referred to [facility] clinic by Roma Kayser, Device RN and is pt of Dr Forde Dandy.  Wife answered the phone and patient was not currently with her.  Per DPR, discussed reason for call and advised would like a device remote transmission for pt's office visit scheduled for 06/19/2020.  Agreed to call back tomorrow morning and will assist to send remote transmission.

## 2020-06-18 NOTE — Telephone Encounter (Addendum)
Spoke with wife.  Assisted in sending Carelink remote transmission for review.  She said patient is doing well and denies any fluid symptoms at this time.  Weight is stable at 196-197.  Reviewed transmission.  Advised report suggests fluid levels returned to normal after taking extra Lasix for a couple of days.  They will be going on vacation for a week and asked if should take the monitor.  Advised he does not need to take monitor if vacation is a week or less.  Pt has an office visit with Bernerd Pho, [place] 9/22.   Explained patient may develop fluid during vacation since it is typically a time when eating foods higher in salt.  Advised to discuss with [place] if patient can take an extra Lasix if he develops fluid symptom but she needs approval to adjust the medication.  Provided my direct ICM number and encouraged to call if he develops symptoms prior to next remote transmission.  Will recheck fluid levels on 07/03/2020 to evaluate if he is retaining fluid after vacation.  Wife works at [employer] from 2 pm - 11pm and best to call between 11-1 since she tends to sleep later.    06/18/2020 Optivol impedance.

## 2020-06-19 ENCOUNTER — Ambulatory Visit (INDEPENDENT_AMBULATORY_CARE_PROVIDER_SITE_OTHER): Payer: Medicare HMO | Admitting: Student

## 2020-06-19 ENCOUNTER — Ambulatory Visit (INDEPENDENT_AMBULATORY_CARE_PROVIDER_SITE_OTHER): Payer: Medicare HMO | Admitting: *Deleted

## 2020-06-19 ENCOUNTER — Encounter: Payer: Self-pay | Admitting: Student

## 2020-06-19 ENCOUNTER — Other Ambulatory Visit: Payer: Self-pay

## 2020-06-19 VITALS — BP 128/80 | HR 90 | Ht 74.5 in | Wt 191.8 lb

## 2020-06-19 DIAGNOSIS — Z89422 Acquired absence of other left toe(s): Secondary | ICD-10-CM | POA: Diagnosis not present

## 2020-06-19 DIAGNOSIS — I10 Essential (primary) hypertension: Secondary | ICD-10-CM | POA: Diagnosis not present

## 2020-06-19 DIAGNOSIS — E785 Hyperlipidemia, unspecified: Secondary | ICD-10-CM

## 2020-06-19 DIAGNOSIS — I5022 Chronic systolic (congestive) heart failure: Secondary | ICD-10-CM

## 2020-06-19 DIAGNOSIS — Z89421 Acquired absence of other right toe(s): Secondary | ICD-10-CM | POA: Diagnosis not present

## 2020-06-19 DIAGNOSIS — Z5181 Encounter for therapeutic drug level monitoring: Secondary | ICD-10-CM

## 2020-06-19 DIAGNOSIS — I4821 Permanent atrial fibrillation: Secondary | ICD-10-CM

## 2020-06-19 LAB — POCT INR: INR: 2.1 (ref 2.0–3.0)

## 2020-06-19 NOTE — Patient Instructions (Signed)
Medication Instructions:  Your physician recommends that you continue on your current medications as directed. Please refer to the Current Medication list given to you today.  *If you need a refill on your cardiac medications before your next appointment, please call your pharmacy*   Lab Work: NONE  If you have labs (blood work) drawn today and your tests are completely normal, you will receive your results only by: Marland Kitchen MyChart Message (if you have MyChart) OR . A paper copy in the mail If you have any lab test that is abnormal or we need to change your treatment, we will call you to review the results.   Testing/Procedures: Your physician has requested that you have an echocardiogram. Echocardiography is a painless test that uses sound waves to create images of your heart. It provides your doctor with information about the size and shape of your heart and how well your heart's chambers and valves are working. This procedure takes approximately one hour. There are no restrictions for this procedure.     Follow-Up: At [facility], you and your health needs are our priority.  As part of our continuing mission to provide you with exceptional heart care, we have created designated Provider Care Teams.  These Care Teams include your primary Cardiologist (physician) and Advanced Practice Providers (APPs -  Physician Assistants and Nurse Practitioners) who all work together to provide you with the care you need, when you need it.  We recommend signing up for the patient portal called "MyChart".  Sign up information is provided on this After Visit Summary.  MyChart is used to connect with patients for Virtual Visits (Telemedicine).  Patients are able to view lab/test results, encounter notes, upcoming appointments, etc.  Non-urgent messages can be sent to your provider as well.   To learn more about what you can do with MyChart, go to [email or web address].    Your next appointment:   4  month(s)  The format for your next appointment:   In Person  Provider:   Rozann Lesches, MD or Bernerd Pho, PA-C   Other Instructions Thank you for choosing [facility]!

## 2020-06-19 NOTE — Progress Notes (Signed)
Cardiology Office Note    Date:  06/19/2020   ID:  [name], DOB 04-Nov-1940, MRN [id number]  PCP:  Daniel Albee, MD  Cardiologist: Daniel Sable, MD (Inactive)  --> Will switch to Dr. Domenic Gross EP: Dr. Lovena Gross  Chief Complaint  Patient presents with   Follow-up    6 month visit    History of Present Illness:    [name] is a 79 y.o. male with past medical history of chronic combined systolic and diastolic CHF/NICM (EF 15% in 2016, at 20-25% by repeat echo in 08/2019, 25-30% in 12/2019), CAD (nonobstructive CAD by cath in 09/2019), permanent atrial fibrillation (on Coumadin), HTN, and HLD who presents to the office today for 19-monthfollow-up.   He did follow-up with Dr. KBronson Gross 12/2019 and denied any recent chest pain or palpitations. Was being followed by Vascular for chronic wounds and had undergone second toe bilateral amputation in 11/2019 by Podiatry. In regards to his cardiomyopathy, he was referred to EP for consideration of ICD placement and CRT given his prolonged QRS. He met with Dr. TLovena Leand underwent placement of a Medtronic Bi-V ICD on 02/29/2020. At the time of his wound check, he was found to have an elevated pacing threshold and diaphragmatic stimulation, therefore his device was reprogrammed to LV 4-coil on 03/27/2020.  He did have increased Opti-vol with his remote transmission earlier this month but he denied any symptoms or changes in weight, therefore was continued on Lasix 244mdaily. He did follow-up with Dr. TaLovena Len 06/11/2020 and reported dietary indiscretion, therefore the importance of limiting salt was reviewed. His ventricular rates were elevated, therefore he was continued on Coreg 37.53m29mID and started on Digoxin 0.1253m87mily. He did have a repeat remote transmission yesterday and his fluid level had returned to baseline. The patient's wife mentioned they were going on vacation and it was recommended the indications for taking an  extra Lasix tablet be reviewed at his visit.   In talking with the patient and his wife today, he reports overall doing well from a cardiac perspective since his last visit. He denies any recent chest pain or palpitations. No recent dyspnea on exertion, orthopnea, or PND. He previously experienced lower extremity edema but denies any recent symptoms since utilizing compression stockings.  Past Medical History:  Diagnosis Date   [facility] CHF (congestive heart failure) (HCC)[place] a. EF 45% in 2016 b. EF at 20-25% by repeat echo in 08/2019 with cath in 09/2019 showing nonobstructive CAD   Chronic atrial fibrillation (HCC)    Chronic bronchitis    Diabetes mellitus, type II (HCC)[place] no insulin   Gout    History of herpes zoster virus    History of radiation therapy 12/21/12- 02/15/13   prostate 79 gray in 41 fx   Hyperlipidemia    Hypertension    Prostate cancer (HCC)Christian14   EBRT + hormonal therapy   Vitamin D deficiency disease 06/07/2019    Past Surgical History:  Procedure Laterality Date   AMPUTATION Bilateral 12/18/2019   Procedure: BILATERAL 2ND TOE AMPUTATION DIGIT;  Surgeon: EvanEdrick KinsM;  [facility];  Laterality: Bilateral;   BIV ICD INSERTION CRT-D N/A 02/29/2020   Procedure: BIV ICD INSERTION CRT-D;  Surgeon: TaylEvans Lance;  [facility];  Service: Cardiovascular;  Laterality: N/A;   CATARACT EXTRACTION, BILATERAL     KNEE SURGERY  left knee   PROSTATE BIOPSY     RIGHT/LEFT HEART CATH AND CORONARY ANGIOGRAPHY N/A 10/04/2019   Procedure: RIGHT/LEFT HEART CATH AND CORONARY ANGIOGRAPHY;  Surgeon: Belva Crome, MD;  Location: [place] [facility];  Service: Cardiovascular;  Laterality: N/A;    Current Medications: Outpatient Medications Prior to Visit  Medication Sig Dispense Refill   acetaminophen (TYLENOL) 500 MG tablet Take 500-1,000 mg by mouth every 6 (six) hours as needed for mild pain or  headache.      allopurinol (ZYLOPRIM) 300 MG tablet TAKE 1 TABLET EVERY DAY 90 tablet 1   Ascorbic Acid 500 MG CAPS Take 1,000 mg by mouth daily.      Blood Glucose Monitoring Suppl (TRUE METRIX METER) w/Device KIT USE AS DIRECTED 1 kit 0   carvedilol (COREG) 25 MG tablet Take 1.5 tablets (37.5 mg total) by mouth 2 (two) times daily. 270 tablet 3   cetaphil (CETAPHIL) lotion Apply 1 application topically as needed for dry skin (leg).     Cholecalciferol (VITAMIN D-3) 125 MCG (5000 UT) TABS Take 5,000 Units by mouth daily.      digoxin (LANOXIN) 0.125 MG tablet Take 1 tablet (0.125 mg total) by mouth daily. 90 tablet 3   ENTRESTO 24-26 MG Take 1 tablet by mouth twice daily 60 tablet 6   furosemide (LASIX) 20 MG tablet Take 1 tablet (20 mg total) by mouth daily. 90 tablet 3   glipiZIDE (GLUCOTROL) 5 MG tablet TAKE 1 TABLET EVERY DAY 90 tablet 0   lovastatin (MEVACOR) 40 MG tablet TAKE 1 TABLET AT BEDTIME (Patient taking differently: 20 mg. Pt only takes 20 mg) 90 tablet 1   MANGANESE PO Take by mouth in the morning, at noon, and at bedtime.     NON FORMULARY Take 3 tablets by mouth daily. MANGA-CAL     spironolactone (ALDACTONE) 25 MG tablet Take 1 tablet (25 mg total) by mouth daily. 90 tablet 3   TRUE METRIX BLOOD GLUCOSE TEST test strip TEST BLOOD SUGAR EVERY DAY 100 strip 3   TRUEplus Lancets 33G MISC TEST BLOOD SUGAR EVERY DAY 100 each 3   vitamin E 400 UNIT capsule Take 400 Units by mouth daily.      warfarin (COUMADIN) 5 MG tablet TAKE 1/2 TABLET ON SATURDAYS AND TAKE 1 TABLET ALL OTHER DAYS 85 tablet 1   No facility-administered medications prior to visit.     Allergies:   Clarithromycin, Fluoride preparations, and Toprol xl [metoprolol tartrate]   Social History   Socioeconomic History   Marital status: Married    Spouse name: Not on file   Number of children: Not on file   Years of education: Not on file   Highest education level: Not on file    Occupational History   Not on file  Tobacco Use   Smoking status: Never Smoker   Smokeless tobacco: Never Used  Vaping Use   Vaping Use: Never used  Substance and Sexual Activity   Alcohol use: No    Alcohol/week: 0.0 standard drinks   Drug use: No   Sexual activity: Not on file  Other Topics Concern   Not on file  Social History Narrative   Not on file   Social Determinants of Health   Financial Resource Strain:    Difficulty of Paying Living Expenses: Not on file  Food Insecurity:    Worried About [place] in the Last Year: Not on file   [employer] of Food  in the Last Year: Not on file  Transportation Needs:    Lack of Transportation (Medical): Not on file   Lack of Transportation (Non-Medical): Not on file  Physical Activity:    Days of Exercise per Week: Not on file   Minutes of Exercise per Session: Not on file  Stress:    Feeling of Stress : Not on file  Social Connections:    Frequency of Communication with Friends and Family: Not on file   Frequency of Social Gatherings with Friends and Family: Not on file   Attends Religious Services: Not on file   Active Member of Clubs or Organizations: Not on file   Attends Archivist Meetings: Not on file   Marital Status: Not on file     Family History:  The patient's family history includes Heart attack in his father; Kidney failure in his mother.   Review of Systems:   Please see the history of present illness.     General:  No chills, fever, night sweats or weight changes.  Cardiovascular:  No chest pain, dyspnea on exertion, orthopnea, palpitations, paroxysmal nocturnal dyspnea. Positive for edema (improved).  Dermatological: No rash, lesions/masses Respiratory: No cough, dyspnea Urologic: No hematuria, dysuria Abdominal:   No nausea, vomiting, diarrhea, bright red blood per rectum, melena, or hematemesis Neurologic:  No visual changes, wkns, changes in mental  status. All other systems reviewed and are otherwise negative except as noted above.   Physical Exam:    VS:  BP 128/80    Pulse 90    Ht 6' 2.5" (1.892 m)    Wt 191 lb 12.8 oz (87 kg)    SpO2 97%    BMI 24.30 kg/m    General: Well developed, well nourished,male appearing in no acute distress. Head: Normocephalic, atraumatic. Neck: No carotid bruits. JVD not elevated.  Lungs: Respirations regular and unlabored, without wheezes or rales.  Heart: Irregularly irregular. No S3 or S4.  No murmur, no rubs, or gallops appreciated. Abdomen: Appears non-distended. No obvious abdominal masses. Msk:  Strength and tone appear normal for age. No obvious joint deformities or effusions. Extremities: No clubbing or cyanosis. No edema. Wearing compression stockings. Distal pedal pulses are 2+ bilaterally. Neuro: Alert and oriented X 3. Moves all extremities spontaneously. No focal deficits noted. Psych:  Responds to questions appropriately with a normal affect. Skin: No rashes or lesions noted  Wt Readings from Last 3 Encounters:  06/19/20 191 lb 12.8 oz (87 kg)  06/11/20 195 lb 12.8 oz (88.8 kg)  03/27/20 194 lb (88 kg)     Studies/Labs Reviewed:   EKG:  EKG is not ordered today.   Recent Labs: 09/26/2019: B Natriuretic Peptide 1,263.0 02/27/2020: Hemoglobin 12.4; Platelets 179 03/20/2020: ALT 18; BUN 24; Creat 1.08; Potassium 4.9; Sodium 139   Lipid Panel    Component Value Date/Time   CHOL 202 (H) 03/20/2020 1352   TRIG 116 03/20/2020 1352   TRIG 110 01/21/2009 0000   HDL 51 03/20/2020 1352   CHOLHDL 4.0 03/20/2020 1352   VLDL 18 02/13/2011 1055   LDLCALC 129 (H) 03/20/2020 1352   LDLCALC 152 01/21/2009 0000    Additional studies/ records that were reviewed today include:   Cardiac Catheterization: 09/2019  Widely patent coronary arteries, essentially normal for the patient's age.  Trivial mid LAD less than 25% eccentric plaque.  Mild pulmonary hypertension with mean pulmonary  artery pressure of 31 mmHg  Severe left ventricular systolic dysfunction with EF less than 25%.  Elevated LVEDP at 21 mmHg consistent with acute on chronic combined systolic and diastolic heart failure.  RECOMMENDATIONS:   Guideline directed therapy for heart failure and atrial fibrillation rate control.  Heart rate was relatively fast during the procedure.  Resume usual Coumadin dose later today.  Discharge home later today.   Echocardiogram: 12/2019 IMPRESSIONS    1. Left ventricular ejection fraction, by estimation, is 25 to 30%. The  left ventricle has severely decreased function. The left ventricle  demonstrates global hypokinesis. There is mild left ventricular  hypertrophy. Left ventricular diastolic parameters  are indeterminate.  2. Right ventricular systolic function is mildly reduced. The right  ventricular size is normal. There is mildly elevated pulmonary artery  systolic pressure.  3. Left atrial size was severely dilated.  4. Right atrial size was moderately dilated.  5. The mitral valve is normal in structure. Trivial mitral valve  regurgitation. No evidence of mitral stenosis.  6. Tricuspid valve regurgitation is moderate.  7. The aortic valve has an indeterminant number of cusps. Aortic valve  regurgitation is not visualized. No aortic stenosis is present.  8. The inferior vena cava is normal in size with greater than 50%  respiratory variability, suggesting right atrial pressure of 3 mmHg.   Assessment:    1. Chronic systolic congestive heart failure ([place])   2. Permanent atrial fibrillation ([place])   3. Essential hypertension   4. Hyperlipidemia LDL goal <70      Plan:   In order of problems listed above:  1. Chronic Combined Systolic and Diastolic CHF/NICM - He has a known reduced EF of 25-30% by echo in 08/2019 with catheterization in 09/2019 showing nonobstructive CAD as outlined above. He did undergo Medtronic Bi-V ICD placement on  02/29/2020 by Dr. Lovena Gross - He denies any recent dyspnea on exertion, orthopnea, PND or lower extremity edema. Weight has actually declined by 4 pounds on the office scales since his last visit and recent remote transmission demonstrated that his fluid level had returned to baseline.  - Will continue current regimen with Coreg 37.5 mg twice daily, Digoxin 0.125 mg daily, Entresto 24-26 mg twice daily, Lasix 20 mg daily and Spironolactone 25 mg daily. We reviewed that he could take an extra Lasix tablet if needed for worsening edema or weight gain while on vacation over the coming week. I did not further titrate his medications for his cardiomyopathy during today's visit given issues with hypotension at prior visits. Will plan to obtain a follow-up echocardiogram in 3 months for reassessment of his EF following BiV ICD placement.   2. Permanent Atrial Fibrillation - Heart rate is well controlled in the 90's during today's visit. He remains on Coreg 37.5 mg twice daily and Digoxin 0.125 mg daily. He is scheduled for a repeat Dig [place] in the coming weeks.  - He denies any evidence of active bleeding and remains on Coumadin for anticoagulation. INR was checked today and at 2.1.  3. HTN - BP is well controlled at 128/80 during today's visit. Continue current medication regimen.  4. HLD - Followed by PCP. He remains on Lovastatin 40 mg daily with goal LDL less than 70 with documented CAD.    Medication Adjustments/Labs and Tests Ordered: Current medicines are reviewed at length with the patient today.  Concerns regarding medicines are outlined above.  Medication changes, Labs and Tests ordered today are listed in the Patient Instructions below. Patient Instructions  Medication Instructions:  Your physician recommends that you continue on your current  medications as directed. Please refer to the Current Medication list given to you today.  *If you need a refill on your cardiac medications before your  next appointment, please call your pharmacy*   Lab Work: NONE  If you have labs (blood work) drawn today and your tests are completely normal, you will receive your results only by:  [place] (if you have MyChart) OR  A paper copy in the mail If you have any lab test that is abnormal or we need to change your treatment, we will call you to review the results.   Testing/Procedures: Your physician has requested that you have an echocardiogram. Echocardiography is a painless test that uses sound waves to create images of your heart. It provides your doctor with information about the size and shape of your heart and how well your hearts chambers and valves are working. This procedure takes approximately one hour. There are no restrictions for this procedure.     Follow-Up: At [facility], you and your health needs are our priority.  As part of our continuing mission to provide you with exceptional heart care, we have created designated Provider Care Teams.  These Care Teams include your primary Cardiologist (physician) and Advanced Practice Providers (APPs -  Physician Assistants and Nurse Practitioners) who all work together to provide you with the care you need, when you need it.  We recommend signing up for the patient portal called "MyChart".  Sign up information is provided on this After Visit Summary.  MyChart is used to connect with patients for Virtual Visits (Telemedicine).  Patients are able to view lab/test results, encounter notes, upcoming appointments, etc.  Non-urgent messages can be sent to your provider as well.   To learn more about what you can do with MyChart, go to [email or web address].    Your next appointment:   4 month(s)  The format for your next appointment:   In Person  Provider:   Rozann Lesches, MD or Bernerd Pho, PA-C   Other Instructions Thank you for choosing [facility]!    Signed, Erma Heritage, PA-C  06/19/2020  6:01 PM    [place] [address] [place], [place] [address] Phone: [phone] Fax: [phone]

## 2020-06-19 NOTE — Patient Instructions (Signed)
Continue warfarin 5 mg daily.  Recheck in 3 weeks

## 2020-06-24 ENCOUNTER — Ambulatory Visit (INDEPENDENT_AMBULATORY_CARE_PROVIDER_SITE_OTHER): Payer: Medicare HMO | Admitting: Nurse Practitioner

## 2020-06-25 ENCOUNTER — Other Ambulatory Visit (INDEPENDENT_AMBULATORY_CARE_PROVIDER_SITE_OTHER): Payer: Self-pay

## 2020-06-25 DIAGNOSIS — M1A0310 Idiopathic chronic gout, right wrist, without tophus (tophi): Secondary | ICD-10-CM

## 2020-06-25 MED ORDER — ALLOPURINOL 300 MG PO TABS: 600.0000 mg | ORAL_TABLET | Freq: Every day | ORAL | 1 refills | Status: DC

## 2020-07-03 ENCOUNTER — Ambulatory Visit (INDEPENDENT_AMBULATORY_CARE_PROVIDER_SITE_OTHER): Payer: Medicare HMO

## 2020-07-03 ENCOUNTER — Telehealth: Payer: Self-pay

## 2020-07-03 DIAGNOSIS — I5022 Chronic systolic (congestive) heart failure: Secondary | ICD-10-CM

## 2020-07-03 DIAGNOSIS — Z95810 Presence of automatic (implantable) cardiac defibrillator: Secondary | ICD-10-CM | POA: Diagnosis not present

## 2020-07-03 NOTE — Progress Notes (Signed)
EPIC Encounter for ICM Monitoring  Patient Name: [name] is a 79 y.o. male Date: 07/03/2020 Primary Care Physican: Doree Albee, MD Primary Cardiologist: Domenic Polite Electrophysiologist: Santina Evans Pacing: 85.1%  06/19/2020 office Weight: 191 lbs       1st ICM Remote Transmission.  Attempted call to wife and unable to reach.  Left detailed message per DPR regarding transmission. Transmission reviewed.    Optivol thoracic impedance normal.  Prescribed:  Furosemide 20 mg take 1 tablet by mouth daily Spironolactone 25 mg take 1 tablet daily  Labs: 03/20/2020 Creatinine 1.08, BUN 24, Potassium 4.9, Sodium 139, GFR 66-76 02/27/2020 Creatinine 1.09, BUN 21, Potassium 4.3, Sodium 138, GFR >60  A complete set of results can be found in Results Review.  Recommendations: Left voice mail with ICM number and encouraged to call if experiencing any fluid symptoms.  Follow-up plan: ICM clinic phone appointment on 08/05/2020.   91 day device clinic remote transmission 08/29/2020.    EP/Cardiology Office Visits: 10/10/2019 with Bernerd Pho, [place].    Copy of ICM check sent to Dr. Lovena Le.   3 month ICM trend: 07/01/2020    1 Year ICM trend:       Rosalene Billings, RN 07/03/2020 2:05 PM

## 2020-07-03 NOTE — Telephone Encounter (Signed)
Remote ICM transmission received.  Attempted call to wife regarding ICM remote transmission and left detailed message per DPR.  Advised to return call for any fluid symptoms or questions. Next ICM remote transmission scheduled 08/05/2020.

## 2020-07-10 ENCOUNTER — Ambulatory Visit (INDEPENDENT_AMBULATORY_CARE_PROVIDER_SITE_OTHER): Payer: Medicare HMO | Admitting: Nurse Practitioner

## 2020-07-10 ENCOUNTER — Other Ambulatory Visit: Payer: Self-pay

## 2020-07-10 ENCOUNTER — Ambulatory Visit (INDEPENDENT_AMBULATORY_CARE_PROVIDER_SITE_OTHER): Payer: Medicare HMO | Admitting: *Deleted

## 2020-07-10 ENCOUNTER — Encounter (INDEPENDENT_AMBULATORY_CARE_PROVIDER_SITE_OTHER): Payer: Self-pay | Admitting: Nurse Practitioner

## 2020-07-10 VITALS — BP 128/78 | HR 94 | Temp 97.3°F | Ht 74.5 in | Wt 196.4 lb

## 2020-07-10 DIAGNOSIS — I4891 Unspecified atrial fibrillation: Secondary | ICD-10-CM

## 2020-07-10 DIAGNOSIS — E782 Mixed hyperlipidemia: Secondary | ICD-10-CM | POA: Diagnosis not present

## 2020-07-10 DIAGNOSIS — E559 Vitamin D deficiency, unspecified: Secondary | ICD-10-CM

## 2020-07-10 DIAGNOSIS — E119 Type 2 diabetes mellitus without complications: Secondary | ICD-10-CM | POA: Diagnosis not present

## 2020-07-10 DIAGNOSIS — I10 Essential (primary) hypertension: Secondary | ICD-10-CM

## 2020-07-10 DIAGNOSIS — Z5181 Encounter for therapeutic drug level monitoring: Secondary | ICD-10-CM

## 2020-07-10 DIAGNOSIS — I4821 Permanent atrial fibrillation: Secondary | ICD-10-CM | POA: Diagnosis not present

## 2020-07-10 LAB — POCT INR: INR: 1.7 — AB (ref 2.0–3.0)

## 2020-07-10 MED ORDER — LOVASTATIN 40 MG PO TABS: 40.0000 mg | ORAL_TABLET | Freq: Every day | ORAL | 1 refills | Status: DC

## 2020-07-10 NOTE — Patient Instructions (Signed)
Take warfarin 1 1/2 tablets tonight then increase dose to 1 tablet daily except 1 1/2 tablets on Mondays and Thursdays Recheck in 3 weeks

## 2020-07-10 NOTE — Patient Instructions (Signed)
Medication Change: START taking 1 whole tablet of lovastatin by mouth every evening

## 2020-07-10 NOTE — Progress Notes (Signed)
Subjective:  Patient ID: [name], male    DOB: 1941-03-12  Age: 79 y.o. MRN: [id number]  CC:  Chief Complaint  Patient presents with  . Follow-up    Doing well  . Atrial Fibrillation  . Hypertension  . Diabetes  . Hyperlipidemia  . Other    Vitamin D deficiency      HPI  This patient arrives today for the above.  Atrial fibrillation: He continues to follow-up with his cardiologist in a regular basis.  He is currently on Coreg, Coumadin, and digoxin.  He does have Bi-V ICD in place as well.  He denies feeling any shocks since having ICD placed.  His INR in digoxin level are monitored by his cardiologist.  He denies any chest pain or difficulty breathing.  Hypertension: In addition to his medications for his atrial fibrillation he is also on furosemide, spironolactone, and Entresto for treatment of hypertension and his CHF.  Tells me is tolerating his medications well.  Last metabolic panel showed normal kidney function.  Diabetes: He does have a history of type 2 diabetes.  Last A1c was 6.1, he continues on glipizide 5 mg daily.  He does not check his blood sugars on a regular basis.  He is not willing to start doing this unless absolutely necessary.  He denies any symptoms of hypoglycemia.  Hyperlipidemia: Goal LDL is 70 or less.  He continues on lovastatin last LDL was 129 this was collected approximately 3 months ago.  He is prescribed to take 1 tablet of lovastatin daily, but tells me he takes half a tablet daily.  Vitamin D deficiency: He continues on vitamin D3 supplement.  He is on 5000 IUs daily, last serum level was 41.  Past Medical History:  Diagnosis Date  . Cancer ([place])   . CHF (congestive heart failure) ([place])    a. EF 45% in 2016 b. EF at 20-25% by repeat echo in 08/2019 with cath in 09/2019 showing nonobstructive CAD  . Chronic atrial fibrillation ([place])   . Chronic bronchitis   . Diabetes mellitus, type II ([place])    no insulin  . Gout   . History  of herpes zoster virus   . History of radiation therapy 12/21/12- 02/15/13   prostate 79 [facility] in 41 fx  . Hyperlipidemia   . Hypertension   . Prostate cancer ([place]) 2014   EBRT + hormonal therapy  . Vitamin D deficiency disease 06/07/2019      Family History  Problem Relation Age of Onset  . Kidney failure Mother   . Heart attack Father     Social History   Social History Narrative  . Not on file   Social History   Tobacco Use  . Smoking status: Never Smoker  . Smokeless tobacco: Never Used  Substance Use Topics  . Alcohol use: No    Alcohol/week: 0.0 standard drinks     Current Meds  Medication Sig  . acetaminophen (TYLENOL) 500 MG tablet Take 500-1,000 mg by mouth every 6 (six) hours as needed for mild pain or headache.   . allopurinol (ZYLOPRIM) 300 MG tablet TAKE 1 TABLET EVERY DAY  . allopurinol (ZYLOPRIM) 300 MG tablet Take 2 tablets (600 mg total) by mouth daily.  . Ascorbic Acid 500 MG CAPS Take 1,000 mg by mouth daily.   . Blood Glucose Monitoring Suppl (TRUE METRIX METER) w/Device KIT USE AS DIRECTED  . cetaphil (CETAPHIL) lotion Apply 1 application topically as needed for  dry skin (leg).  . Cholecalciferol (VITAMIN D-3) 125 MCG (5000 UT) TABS Take 5,000 Units by mouth daily.   . digoxin (LANOXIN) 0.125 MG tablet Take 1 tablet (0.125 mg total) by mouth daily.  Marland Kitchen ENTRESTO 24-26 MG Take 1 tablet by mouth twice daily  . furosemide (LASIX) 20 MG tablet Take 1 tablet (20 mg total) by mouth daily.  Marland Kitchen glipiZIDE (GLUCOTROL) 5 MG tablet TAKE 1 TABLET EVERY DAY  . lovastatin (MEVACOR) 40 MG tablet Take 1 tablet (40 mg total) by mouth at bedtime.  Marland Kitchen MANGANESE PO Take by mouth in the morning, at noon, and at bedtime.  . NON FORMULARY Take 3 tablets by mouth daily. MANGA-CAL  . spironolactone (ALDACTONE) 25 MG tablet Take 1 tablet (25 mg total) by mouth daily.  . TRUE METRIX BLOOD GLUCOSE TEST test strip TEST BLOOD SUGAR EVERY DAY  . TRUEplus Lancets 33G MISC TEST BLOOD  SUGAR EVERY DAY  . vitamin E 400 UNIT capsule Take 400 Units by mouth daily.   Marland Kitchen warfarin (COUMADIN) 5 MG tablet TAKE 1/2 TABLET ON SATURDAYS AND TAKE 1 TABLET ALL OTHER DAYS  . [DISCONTINUED] lovastatin (MEVACOR) 40 MG tablet TAKE 1 TABLET AT BEDTIME (Patient taking differently: 20 mg. Pt only takes 20 mg)    ROS:  Review of Systems  Constitutional: Negative for fever and malaise/fatigue.  Eyes: Negative for blurred vision.  Respiratory: Negative for cough and shortness of breath.   Cardiovascular: Negative for chest pain and palpitations.  Neurological: Negative for dizziness, seizures and headaches.     Objective:   Today's Vitals: BP 128/78   Pulse 94   Temp (!) 97.3 F (36.3 C) (Temporal)   Ht 6' 2.5" (1.892 m)   Wt 196 lb 6.4 oz (89.1 kg)   SpO2 98%   BMI 24.88 kg/m  Vitals with BMI 07/10/2020 06/19/2020 06/11/2020  Height 6' 2.5" 6' 2.5" 6' 2.5"  Weight 196 lbs 6 oz 191 lbs 13 oz 195 lbs 13 oz  BMI 24.89 14.2 39.53  Systolic 202 334 356  Diastolic 78 80 64  Pulse 94 90 54     Physical Exam Vitals reviewed.  Constitutional:      Appearance: Normal appearance.  HENT:     Head: Normocephalic and atraumatic.  Cardiovascular:     Rate and Rhythm: Normal rate. Rhythm irregular.     Pulses:          Dorsalis pedis pulses are 1+ on the right side and 1+ on the left side.  Pulmonary:     Effort: Pulmonary effort is normal.     Breath sounds: Normal breath sounds.  Musculoskeletal:     Cervical back: Neck supple.     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     Left foot: Deformity (2nd digit amputation) present.  Feet:     Right foot:     Protective Sensation: 10 sites tested. 4 sites sensed.     Skin integrity: Skin integrity normal.     Toenail Condition: Right toenails are normal.     Left foot:     Protective Sensation: 10 sites tested. 3 sites sensed.     Skin integrity: Skin integrity normal.     Toenail Condition: Left toenails are normal.    Skin:    General: Skin is warm and dry.  Neurological:     Mental Status: He is alert and oriented to person, place, and time.  Psychiatric:  Mood and Affect: Mood normal.        Behavior: Behavior normal.        Thought Content: Thought content normal.        Judgment: Judgment normal.          Assessment and Plan   1. Essential hypertension   2. Type 2 diabetes mellitus without complication, without long-term current use of insulin ([place])   3. Mixed hyperlipidemia   4. Vitamin D deficiency disease   5. Atrial fibrillation, unspecified type ([place])      Plan: 1.  Blood pressure at goal he will continue on his current medications. 2.  Foot exam completed today, will check A1c.  We did discuss possibly trialing him off of glipizide release checking a fasting blood sugar daily to monitor for any hypoglycemic events.  He tells me he is not willing to do either right now.  Will check A1c as well as metabolic panel today for further evaluation.  We did discuss signs and symptoms of hypoglycemia and that he needs to notify me if he has any of these events between now and his next appointment. 3.  We will increase lovastatin to 40 mg daily and he will return in 6 weeks to have repeat lipid panel and metabolic panel checked. 4.  We will continue on his vitamin D3 supplement, will check serum level today. 5.  Rate controlled today, he will continue to follow-up with cardiology and continue taking his medications as prescribed.   Tests ordered Orders Placed This Encounter  Procedures  . CMP with eGFR([facility])  . Hemoglobin A1c  . Vitamin D, 25-hydroxy      Meds ordered this encounter  Medications  . lovastatin (MEVACOR) 40 MG tablet    Sig: Take 1 tablet (40 mg total) by mouth at bedtime.    Dispense:  90 tablet    Refill:  1    Order Specific Question:   Supervising Provider    Answer:   Doree Albee [[id number]]    Patient to follow-up in 6 weeks or sooner as  needed.  Ailene Ards, NP

## 2020-07-11 ENCOUNTER — Other Ambulatory Visit (INDEPENDENT_AMBULATORY_CARE_PROVIDER_SITE_OTHER): Payer: Self-pay | Admitting: Nurse Practitioner

## 2020-07-11 DIAGNOSIS — E875 Hyperkalemia: Secondary | ICD-10-CM

## 2020-07-11 LAB — COMPLETE METABOLIC PANEL WITH GFR
AG Ratio: 1.2 (calc) (ref 1.0–2.5)
ALT: 25 U/L (ref 9–46)
AST: 22 U/L (ref 10–35)
Albumin: 4.1 g/dL (ref 3.6–5.1)
Alkaline phosphatase (APISO): 108 U/L (ref 35–144)
BUN/Creatinine Ratio: 26 (calc) — ABNORMAL HIGH (ref 6–22)
BUN: 29 mg/dL — ABNORMAL HIGH (ref 7–25)
CO2: 27 mmol/L (ref 20–32)
Calcium: 9.7 mg/dL (ref 8.6–10.3)
Chloride: 106 mmol/L (ref 98–110)
Creat: 1.1 mg/dL (ref 0.70–1.18)
GFR, Est African American: 74 mL/min/{1.73_m2} (ref >=60)
GFR, Est Non African American: 64 mL/min/{1.73_m2} (ref >=60)
Globulin: 3.3 g/dL (calc) (ref 1.9–3.7)
Glucose, Bld: 226 mg/dL — ABNORMAL HIGH (ref 65–99)
Potassium: 5.4 mmol/L — ABNORMAL HIGH (ref 3.5–5.3)
Sodium: 143 mmol/L (ref 135–146)
Total Bilirubin: 0.6 mg/dL (ref 0.2–1.2)
Total Protein: 7.4 g/dL (ref 6.1–8.1)

## 2020-07-11 LAB — VITAMIN D 25 HYDROXY (VIT D DEFICIENCY, FRACTURES): Vit D, 25-Hydroxy: 39 ng/mL (ref 30–100)

## 2020-07-11 LAB — HEMOGLOBIN A1C
Hgb A1c MFr Bld: 6.9 % of total Hgb — ABNORMAL HIGH (ref <5.7)
Mean Plasma Glucose: 151 (calc)
eAG (mmol/L): 8.4 (calc)

## 2020-07-22 ENCOUNTER — Other Ambulatory Visit: Payer: Self-pay

## 2020-07-22 ENCOUNTER — Other Ambulatory Visit (INDEPENDENT_AMBULATORY_CARE_PROVIDER_SITE_OTHER): Payer: Medicare HMO

## 2020-07-22 DIAGNOSIS — E875 Hyperkalemia: Secondary | ICD-10-CM | POA: Diagnosis not present

## 2020-07-23 LAB — COMPLETE METABOLIC PANEL WITH GFR
AG Ratio: 1.3 (calc) (ref 1.0–2.5)
ALT: 34 U/L (ref 9–46)
AST: 31 U/L (ref 10–35)
Albumin: 4.1 g/dL (ref 3.6–5.1)
Alkaline phosphatase (APISO): 89 U/L (ref 35–144)
BUN/Creatinine Ratio: 30 (calc) — ABNORMAL HIGH (ref 6–22)
BUN: 35 mg/dL — ABNORMAL HIGH (ref 7–25)
CO2: 25 mmol/L (ref 20–32)
Calcium: 10 mg/dL (ref 8.6–10.3)
Chloride: 108 mmol/L (ref 98–110)
Creat: 1.16 mg/dL (ref 0.70–1.18)
GFR, Est African American: 70 mL/min/{1.73_m2} (ref >=60)
GFR, Est Non African American: 60 mL/min/{1.73_m2} (ref >=60)
Globulin: 3.2 g/dL (calc) (ref 1.9–3.7)
Glucose, Bld: 149 mg/dL — ABNORMAL HIGH (ref 65–99)
Potassium: 5.3 mmol/L (ref 3.5–5.3)
Sodium: 140 mmol/L (ref 135–146)
Total Bilirubin: 0.7 mg/dL (ref 0.2–1.2)
Total Protein: 7.3 g/dL (ref 6.1–8.1)

## 2020-07-24 ENCOUNTER — Other Ambulatory Visit (INDEPENDENT_AMBULATORY_CARE_PROVIDER_SITE_OTHER): Payer: Self-pay | Admitting: Nurse Practitioner

## 2020-07-24 DIAGNOSIS — E119 Type 2 diabetes mellitus without complications: Secondary | ICD-10-CM

## 2020-07-31 ENCOUNTER — Ambulatory Visit (INDEPENDENT_AMBULATORY_CARE_PROVIDER_SITE_OTHER): Payer: Medicare HMO | Admitting: *Deleted

## 2020-07-31 DIAGNOSIS — Z5181 Encounter for therapeutic drug level monitoring: Secondary | ICD-10-CM

## 2020-07-31 DIAGNOSIS — I4821 Permanent atrial fibrillation: Secondary | ICD-10-CM

## 2020-07-31 LAB — POCT INR: INR: 5.4 — AB (ref 2.0–3.0)

## 2020-07-31 NOTE — Patient Instructions (Signed)
Hold warfarin tonight and tomorrow night then decrease dose to 1 tablet daily except 1 1/2 tablets on Thursdays Recheck in 1 week

## 2020-08-01 ENCOUNTER — Other Ambulatory Visit: Payer: Self-pay

## 2020-08-01 NOTE — Telephone Encounter (Signed)
This is a [place] pt.  °

## 2020-08-05 ENCOUNTER — Telehealth: Payer: Self-pay

## 2020-08-05 ENCOUNTER — Ambulatory Visit (INDEPENDENT_AMBULATORY_CARE_PROVIDER_SITE_OTHER): Payer: Medicare HMO

## 2020-08-05 DIAGNOSIS — I5022 Chronic systolic (congestive) heart failure: Secondary | ICD-10-CM

## 2020-08-05 DIAGNOSIS — Z95810 Presence of automatic (implantable) cardiac defibrillator: Secondary | ICD-10-CM

## 2020-08-05 NOTE — Progress Notes (Signed)
EPIC Encounter for ICM Monitoring  Patient Name: [name] is a 79 y.o. male Date: 08/05/2020 Primary Care Physican: Doree Albee, MD Primary Cardiologist: Domenic Polite Electrophysiologist: Santina Evans Pacing: 90.1%         06/19/2020 office Weight: 191 lbs                                              Attempted call to wife and unable to reach.  Left detailed message per DPR regarding transmission. Transmission reviewed.    Optivol thoracic impedance normal.  Prescribed:  Furosemide 20 mg take 1 tablet by mouth daily Spironolactone 25 mg take 1 tablet daily  Labs: 03/20/2020 Creatinine 1.08, BUN 24, Potassium 4.9, Sodium 139, GFR 66-76 02/27/2020 Creatinine 1.09, BUN 21, Potassium 4.3, Sodium 138, GFR >60  A complete set of results can be found in Results Review.  Recommendations: Left voice mail with ICM number and encouraged to call if experiencing any fluid symptoms.  Follow-up plan: ICM clinic phone appointment on 09/10/2020.   91 day device clinic remote transmission 08/29/2020.    EP/Cardiology Office Visits: 10/09/2020 with Bernerd Pho, [place].    Copy of ICM check sent to Dr. Lovena Le.    3 month ICM trend: 08/05/2020    1 Year ICM trend:       Rosalene Billings, RN 08/05/2020 12:30 PM

## 2020-08-05 NOTE — Telephone Encounter (Signed)
Remote ICM transmission received.  Attempted call to wife regarding ICM remote transmission and left detailed message per DPR.  Advised to return call for any fluid symptoms or questions.

## 2020-08-06 ENCOUNTER — Other Ambulatory Visit: Payer: Self-pay

## 2020-08-06 MED ORDER — DIGOXIN 125 MCG PO TABS: 0.1250 mg | ORAL_TABLET | Freq: Every day | ORAL | 3 refills | Status: AC

## 2020-08-06 NOTE — Telephone Encounter (Signed)
This is a [place] pt. [facility] mail order pharmacy is requesting a refill. Please address

## 2020-08-06 NOTE — Telephone Encounter (Signed)
Refilled Digoxin to [facility]

## 2020-08-07 ENCOUNTER — Ambulatory Visit (INDEPENDENT_AMBULATORY_CARE_PROVIDER_SITE_OTHER): Payer: Medicare HMO | Admitting: *Deleted

## 2020-08-07 DIAGNOSIS — Z5181 Encounter for therapeutic drug level monitoring: Secondary | ICD-10-CM

## 2020-08-07 DIAGNOSIS — I4821 Permanent atrial fibrillation: Secondary | ICD-10-CM | POA: Diagnosis not present

## 2020-08-07 LAB — POCT INR: INR: 1.2 — AB (ref 2.0–3.0)

## 2020-08-07 NOTE — Patient Instructions (Signed)
Take warfarin 2 tablets tonight and tomorrow night then increase dose to 1 tablet daily except 1 1/2 tablets on Mondays and Thursdays Recheck in 2 weeks

## 2020-08-24 ENCOUNTER — Other Ambulatory Visit (INDEPENDENT_AMBULATORY_CARE_PROVIDER_SITE_OTHER): Payer: Self-pay | Admitting: Nurse Practitioner

## 2020-08-26 ENCOUNTER — Ambulatory Visit (INDEPENDENT_AMBULATORY_CARE_PROVIDER_SITE_OTHER): Payer: Medicare HMO | Admitting: *Deleted

## 2020-08-26 ENCOUNTER — Other Ambulatory Visit: Payer: Self-pay

## 2020-08-26 DIAGNOSIS — I4821 Permanent atrial fibrillation: Secondary | ICD-10-CM | POA: Diagnosis not present

## 2020-08-26 DIAGNOSIS — Z5181 Encounter for therapeutic drug level monitoring: Secondary | ICD-10-CM | POA: Diagnosis not present

## 2020-08-26 LAB — POCT INR: INR: 3.9 — AB (ref 2.0–3.0)

## 2020-08-26 NOTE — Patient Instructions (Signed)
Hold warfarin tonight then resume 1 tablet daily except 1 1/2 tablets on Mondays and Thursdays Recheck in 3 weeks  

## 2020-08-28 ENCOUNTER — Other Ambulatory Visit: Payer: Self-pay

## 2020-08-28 ENCOUNTER — Ambulatory Visit (INDEPENDENT_AMBULATORY_CARE_PROVIDER_SITE_OTHER): Payer: Medicare HMO | Admitting: Nurse Practitioner

## 2020-08-28 ENCOUNTER — Other Ambulatory Visit (INDEPENDENT_AMBULATORY_CARE_PROVIDER_SITE_OTHER): Payer: Self-pay | Admitting: Internal Medicine

## 2020-08-28 ENCOUNTER — Encounter (INDEPENDENT_AMBULATORY_CARE_PROVIDER_SITE_OTHER): Payer: Self-pay | Admitting: Nurse Practitioner

## 2020-08-28 VITALS — BP 110/67 | HR 67 | Temp 98.1°F | Resp 19 | Ht 72.0 in | Wt 185.8 lb

## 2020-08-28 DIAGNOSIS — E119 Type 2 diabetes mellitus without complications: Secondary | ICD-10-CM

## 2020-08-28 DIAGNOSIS — E782 Mixed hyperlipidemia: Secondary | ICD-10-CM

## 2020-08-28 DIAGNOSIS — I5042 Chronic combined systolic (congestive) and diastolic (congestive) heart failure: Secondary | ICD-10-CM

## 2020-08-28 DIAGNOSIS — I4891 Unspecified atrial fibrillation: Secondary | ICD-10-CM

## 2020-08-28 DIAGNOSIS — M1A0310 Idiopathic chronic gout, right wrist, without tophus (tophi): Secondary | ICD-10-CM

## 2020-08-28 NOTE — Patient Instructions (Addendum)
To schedule an appointment with [facility] for your lab draw visit [email or web address] or Call: [phone]. Or you may go to [facility] as a walk-in. Their [place] location address is [address], [place], [place]. Their hours are Monday-Friday from 7:00AM-12:00PM and 1:00PM-5:00PM.

## 2020-08-28 NOTE — Progress Notes (Signed)
Subjective:  Patient ID: [name], male    DOB: [birth year]  Age: 79 y.o. MRN: [id number]  CC:  Chief Complaint  Patient presents with  . Hyperlipidemia      HPI  This patient arrives today for the above.  At his last office visit we increase his lovastatin dose from 20 to 40 mg daily.  He tells me is tolerating his medication well.  Goal LDL 70, last LDL collected in June of this year was 129.  He is due to have lipid panel and CMP checked today.  He tells me overall he is feeling well, and denies any swelling, shortness of breath, chest pain, defibrillation from his ICD, or dizziness.  He does have a history of CHF as well as A. fib and plans on following up with his cardiologist as scheduled.  He is scheduled for echocardiogram next week and for office visit in January.  Past Medical History:  Diagnosis Date  . Cancer ([place])   . CHF (congestive heart failure) ([place])    a. EF 45% in 2016 b. EF at 20-25% by repeat echo in 08/2019 with cath in 09/2019 showing nonobstructive CAD  . Chronic atrial fibrillation ([place])   . Chronic bronchitis   . Diabetes mellitus, type II ([place])    no insulin  . Gout   . History of herpes zoster virus   . History of radiation therapy 12/21/12- 02/15/13   prostate 79 [address] in 41 fx  . Hyperlipidemia   . Hypertension   . Prostate cancer ([place]) 2014   EBRT + hormonal therapy  . Vitamin D deficiency disease 06/07/2019      Family History  Problem Relation Age of Onset  . Kidney failure Mother   . Heart attack Father     Social History   Social History Narrative  . Not on file   Social History   Tobacco Use  . Smoking status: Never Smoker  . Smokeless tobacco: Never Used  Substance Use Topics  . Alcohol use: No    Alcohol/week: 0.0 standard drinks     Current Meds  Medication Sig  . acetaminophen (TYLENOL) 500 MG tablet Take 500-1,000 mg by mouth every 6 (six) hours as needed for mild pain or headache.   . allopurinol  (ZYLOPRIM) 300 MG tablet TAKE 1 TABLET EVERY DAY  . allopurinol (ZYLOPRIM) 300 MG tablet Take 2 tablets (600 mg total) by mouth daily.  . Ascorbic Acid 500 MG CAPS Take 1,000 mg by mouth daily.   . Blood Glucose Monitoring Suppl (TRUE METRIX METER) w/Device KIT USE AS DIRECTED  . cetaphil (CETAPHIL) lotion Apply 1 application topically as needed for dry skin (leg).  . Cholecalciferol (VITAMIN D-3) 125 MCG (5000 UT) TABS Take 5,000 Units by mouth daily.   . digoxin (LANOXIN) 0.125 MG tablet Take 1 tablet (0.125 mg total) by mouth daily.  Marland Kitchen ENTRESTO 24-26 MG Take 1 tablet by mouth twice daily  . furosemide (LASIX) 20 MG tablet Take 1 tablet (20 mg total) by mouth daily.  Marland Kitchen glipiZIDE (GLUCOTROL) 5 MG tablet TAKE 1 TABLET EVERY DAY  . lovastatin (MEVACOR) 40 MG tablet Take 1 tablet (40 mg total) by mouth at bedtime.  Marland Kitchen MANGANESE PO Take by mouth in the morning, at noon, and at bedtime.  . NON FORMULARY Take 3 tablets by mouth daily. MANGA-CAL  . TRUE METRIX BLOOD GLUCOSE TEST test strip TEST BLOOD SUGAR EVERY DAY  . TRUEplus Lancets 33G MISC  TEST BLOOD SUGAR EVERY DAY  . vitamin E 400 UNIT capsule Take 400 Units by mouth daily.   Marland Kitchen warfarin (COUMADIN) 5 MG tablet TAKE 1/2 TABLET ON SATURDAYS AND TAKE 1 TABLET ALL OTHER DAYS    ROS:  See HPI   Objective:   Today's Vitals: BP 110/67 (BP Location: Left Arm, Patient Position: Sitting, Cuff Size: Normal)   Pulse 67   Temp 98.1 F (36.7 C) (Temporal)   Resp 19   Ht 6' (1.829 m)   Wt 185 lb 12.8 oz (84.3 kg)   SpO2 98%   BMI 25.20 kg/m  Vitals with BMI 08/28/2020 07/10/2020 06/19/2020  Height '6\' 0"'  6' 2.5" 6' 2.5"  Weight 185 lbs 13 oz 196 lbs 6 oz 191 lbs 13 oz  BMI 25.19 75.44 92.0  Systolic 100 712 197  Diastolic 67 78 80  Pulse 67 94 90     Physical Exam Vitals reviewed.  Constitutional:      Appearance: Normal appearance.  HENT:     Head: Normocephalic and atraumatic.  Cardiovascular:     Rate and Rhythm: Normal rate.  Rhythm irregular.  Pulmonary:     Effort: Pulmonary effort is normal.     Breath sounds: Normal breath sounds.  Musculoskeletal:     Cervical back: Neck supple.  Skin:    General: Skin is warm and dry.  Neurological:     Mental Status: He is alert and oriented to person, place, and time.  Psychiatric:        Mood and Affect: Mood normal.        Behavior: Behavior normal.        Thought Content: Thought content normal.        Judgment: Judgment normal.          Assessment and Plan   1. Mixed hyperlipidemia   2. Chronic combined systolic and diastolic heart failure ([place])   3. Atrial fibrillation, unspecified type ([place])      Plan: 1.  We will order lipid panel and CMP today, however we do not have pharmacy in the office today.  He was told to either come back next week or proceed to [facility] within the next couple of days to have blood work drawn.  He tells me he plans on doing this.  For now continue taking his medication as prescribed. 2.-3.  He was encouraged to follow-up with cardiology as scheduled and continue taking his medications as prescribed.   Tests ordered Orders Placed This Encounter  Procedures  . CMP with eGFR([facility])  . Lipid Panel      No orders of the defined types were placed in this encounter.   Patient to follow-up in 3 months or sooner as needed.  Ailene Ards, NP

## 2020-08-29 ENCOUNTER — Ambulatory Visit (INDEPENDENT_AMBULATORY_CARE_PROVIDER_SITE_OTHER): Payer: Medicare HMO

## 2020-08-29 DIAGNOSIS — I4891 Unspecified atrial fibrillation: Secondary | ICD-10-CM

## 2020-08-30 LAB — CUP PACEART REMOTE DEVICE CHECK
Battery Remaining Longevity: 54 mo
Battery Voltage: 2.99 V
Brady Statistic AP VP Percent: 0 %
Brady Statistic AP VS Percent: 0 %
Brady Statistic AS VP Percent: 0 %
Brady Statistic AS VS Percent: 0 %
Brady Statistic RA Percent Paced: 0 %
Brady Statistic RV Percent Paced: 95.01 %
Date Time Interrogation Session: 20211202043623
HighPow Impedance: 80 Ohm
Implantable Lead Implant Date: 20210603
Implantable Lead Implant Date: 20210603
Implantable Lead Location: 753858
Implantable Lead Location: 753860
Implantable Lead Model: 4598
Implantable Pulse Generator Implant Date: 20210603
Lead Channel Impedance Value: 184.154
Lead Channel Impedance Value: 191.854
Lead Channel Impedance Value: 216.367
Lead Channel Impedance Value: 237.865
Lead Channel Impedance Value: 250.87 Ohm
Lead Channel Impedance Value: 342 Ohm
Lead Channel Impedance Value: 380 Ohm
Lead Channel Impedance Value: 399 Ohm
Lead Channel Impedance Value: 4047 Ohm
Lead Channel Impedance Value: 437 Ohm
Lead Channel Impedance Value: 437 Ohm
Lead Channel Impedance Value: 532 Ohm
Lead Channel Impedance Value: 589 Ohm
Lead Channel Impedance Value: 627 Ohm
Lead Channel Impedance Value: 703 Ohm
Lead Channel Impedance Value: 836 Ohm
Lead Channel Impedance Value: 855 Ohm
Lead Channel Impedance Value: 893 Ohm
Lead Channel Pacing Threshold Amplitude: 0.5 V
Lead Channel Pacing Threshold Amplitude: 1.5 V
Lead Channel Pacing Threshold Pulse Width: 0.4 ms
Lead Channel Pacing Threshold Pulse Width: 0.8 ms
Lead Channel Sensing Intrinsic Amplitude: 8.125 mV
Lead Channel Sensing Intrinsic Amplitude: 8.125 mV
Lead Channel Setting Pacing Amplitude: 2.5 V
Lead Channel Setting Pacing Amplitude: 3.25 V
Lead Channel Setting Pacing Pulse Width: 0.4 ms
Lead Channel Setting Pacing Pulse Width: 0.8 ms
Lead Channel Setting Sensing Sensitivity: 0.3 mV

## 2020-09-04 ENCOUNTER — Other Ambulatory Visit: Payer: Self-pay

## 2020-09-04 ENCOUNTER — Ambulatory Visit (HOSPITAL_COMMUNITY)
Admission: RE | Admit: 2020-09-04 | Discharge: 2020-09-04 | Disposition: A | Discharge status: Home / Self Care | Payer: Medicare HMO | Source: Ambulatory Visit | Attending: Student | Admitting: Student

## 2020-09-04 DIAGNOSIS — I5022 Chronic systolic (congestive) heart failure: Secondary | ICD-10-CM

## 2020-09-04 DIAGNOSIS — E782 Mixed hyperlipidemia: Secondary | ICD-10-CM | POA: Diagnosis not present

## 2020-09-04 LAB — ECHOCARDIOGRAM COMPLETE
AR max vel: 1.18 cm2
AV Area VTI: 1.17 cm2
AV Area mean vel: 1.01 cm2
AV Mean grad: 7.8 mmHg
AV Peak grad: 12.2 mmHg
Ao pk vel: 1.75 m/s
Area-P 1/2: 3.07 cm2
Calc EF: 44 %
S' Lateral: 4.2 cm
Single Plane A2C EF: 43.3 %
Single Plane A4C EF: 46.8 %

## 2020-09-04 NOTE — Progress Notes (Signed)
*  PRELIMINARY RESULTS* Echocardiogram 2D Echocardiogram has been performed.  [name] 09/04/2020, 2:57 PM

## 2020-09-05 ENCOUNTER — Telehealth: Payer: Self-pay | Admitting: Student

## 2020-09-05 LAB — COMPLETE METABOLIC PANEL WITH GFR
AG Ratio: 1.3 (calc) (ref 1.0–2.5)
ALT: 18 U/L (ref 9–46)
AST: 18 U/L (ref 10–35)
Albumin: 4.1 g/dL (ref 3.6–5.1)
Alkaline phosphatase (APISO): 88 U/L (ref 35–144)
BUN/Creatinine Ratio: 25 (calc) — ABNORMAL HIGH (ref 6–22)
BUN: 26 mg/dL — ABNORMAL HIGH (ref 7–25)
CO2: 27 mmol/L (ref 20–32)
Calcium: 9.7 mg/dL (ref 8.6–10.3)
Chloride: 105 mmol/L (ref 98–110)
Creat: 1.02 mg/dL (ref 0.70–1.18)
GFR, Est African American: 81 mL/min/{1.73_m2} (ref >=60)
GFR, Est Non African American: 70 mL/min/{1.73_m2} (ref >=60)
Globulin: 3.2 g/dL (calc) (ref 1.9–3.7)
Glucose, Bld: 140 mg/dL — ABNORMAL HIGH (ref 65–99)
Potassium: 4.3 mmol/L (ref 3.5–5.3)
Sodium: 141 mmol/L (ref 135–146)
Total Bilirubin: 0.6 mg/dL (ref 0.2–1.2)
Total Protein: 7.3 g/dL (ref 6.1–8.1)

## 2020-09-05 LAB — LIPID PANEL
Cholesterol: 161 mg/dL (ref <200)
HDL: 41 mg/dL (ref >=40)
LDL Cholesterol (Calc): 98 mg/dL (calc)
Non-HDL Cholesterol (Calc): 120 mg/dL (calc) (ref <130)
Total CHOL/HDL Ratio: 3.9 (calc) (ref <5.0)
Triglycerides: 127 mg/dL (ref <150)

## 2020-09-05 NOTE — Telephone Encounter (Signed)
New message     Patient wife returning call for results for echo

## 2020-09-05 NOTE — Telephone Encounter (Signed)
Wife given results of echo, copied pcp

## 2020-09-10 ENCOUNTER — Ambulatory Visit (INDEPENDENT_AMBULATORY_CARE_PROVIDER_SITE_OTHER): Payer: Medicare HMO

## 2020-09-10 DIAGNOSIS — Z95810 Presence of automatic (implantable) cardiac defibrillator: Secondary | ICD-10-CM

## 2020-09-10 DIAGNOSIS — I5022 Chronic systolic (congestive) heart failure: Secondary | ICD-10-CM

## 2020-09-10 NOTE — Progress Notes (Signed)
EPIC Encounter for ICM Monitoring  Patient Name: [name] is a 79 y.o. male Date: 09/10/2020 Primary Care Physican: Doree Albee, MD Primary Cardiologist:[place] Electrophysiologist:Taylor Bi-V Pacing:96.1% 12/14/2021Weight: 185lbs    Spoke with wife and patient is doing well.  Optivol thoracic impedance normal.  Prescribed:  Furosemide20 mg take 1 tablet by mouth daily Spironolactone 25 mg take 1 tablet daily  Labs: 03/20/2020 Creatinine1.08, BUN24, Potassium4.9, [id number], [id number] 02/27/2020 Creatinine1.09, BUN21, Potassium4.3, Sodium138, GFR>60 A complete set of results can be found in Results Review.  Recommendations:No changes and encouraged to call if experiencing any fluid symptoms.  Follow-up plan: ICM clinic phone appointment on1/14/2022. 91 day device clinic remote transmission 11/28/2020.   EP/Cardiology Office Visits:10/09/2020 withBrittany Lynne Leader.   Copy of ICM check sent to Dr.Taylor.     3 month ICM trend: 09/10/2020    1 Year ICM trend:       Rosalene Billings, RN 09/10/2020 11:38 AM

## 2020-09-10 NOTE — Progress Notes (Signed)
Remote ICD transmission.   

## 2020-09-16 ENCOUNTER — Other Ambulatory Visit (HOSPITAL_COMMUNITY)
Admission: RE | Admit: 2020-09-16 | Discharge: 2020-09-16 | Disposition: A | Discharge status: Home / Self Care | Payer: Medicare HMO | Source: Ambulatory Visit | Attending: Cardiology | Admitting: Cardiology

## 2020-09-16 ENCOUNTER — Ambulatory Visit (INDEPENDENT_AMBULATORY_CARE_PROVIDER_SITE_OTHER): Payer: Medicare HMO | Admitting: *Deleted

## 2020-09-16 ENCOUNTER — Other Ambulatory Visit: Payer: Self-pay

## 2020-09-16 DIAGNOSIS — Z5181 Encounter for therapeutic drug level monitoring: Secondary | ICD-10-CM | POA: Insufficient documentation

## 2020-09-16 DIAGNOSIS — I4821 Permanent atrial fibrillation: Secondary | ICD-10-CM

## 2020-09-16 LAB — POCT INR: INR: 6.1 — AB (ref 2.0–3.0)

## 2020-09-16 LAB — PROTIME-INR
INR: 3.9 — ABNORMAL HIGH (ref 0.8–1.2)
Prothrombin Time: 36.8 seconds — ABNORMAL HIGH (ref 11.4–15.2)

## 2020-09-16 NOTE — Patient Instructions (Signed)
POC INR 6.1   Sent to [facility] INR 3.9 Hold warfarin tonight then decrease dose to 1 tablet daily Recheck in 3 weeks Pt denies any sign of bleeding or excessive bruising Bleeding and fall precautions discussed with pt and wife and they verbalized understanding.

## 2020-09-18 ENCOUNTER — Telehealth: Payer: Self-pay | Admitting: *Deleted

## 2020-09-18 ENCOUNTER — Other Ambulatory Visit: Payer: Self-pay | Admitting: *Deleted

## 2020-09-18 MED ORDER — ENTRESTO 24-26 MG PO TABS: 1.0000 | ORAL_TABLET | Freq: Two times a day (BID) | ORAL | 6 refills | Status: DC

## 2020-09-18 NOTE — Telephone Encounter (Signed)
Spoke with [place] who states that pt was moving and lost Entresto 24/26 mg. Pt and pharmacy requesting that pt be given samples. 1 Bottle of Entresto 24/26mg  placed at front desk for pick up. ( Lot # [id number], Exp: May 2023)

## 2020-09-20 ENCOUNTER — Other Ambulatory Visit (INDEPENDENT_AMBULATORY_CARE_PROVIDER_SITE_OTHER): Payer: Self-pay | Admitting: Internal Medicine

## 2020-09-20 DIAGNOSIS — E119 Type 2 diabetes mellitus without complications: Secondary | ICD-10-CM

## 2020-09-21 IMAGING — CR DG FOOT COMPLETE 3+V*L*
3 series · 3 of 3 positions shown · non-contrast
Comparison: None.

CLINICAL DATA: Nonhealing wound at left second toe

EXAM:
LEFT FOOT - COMPLETE 3+ VIEW

[t foot ap left]
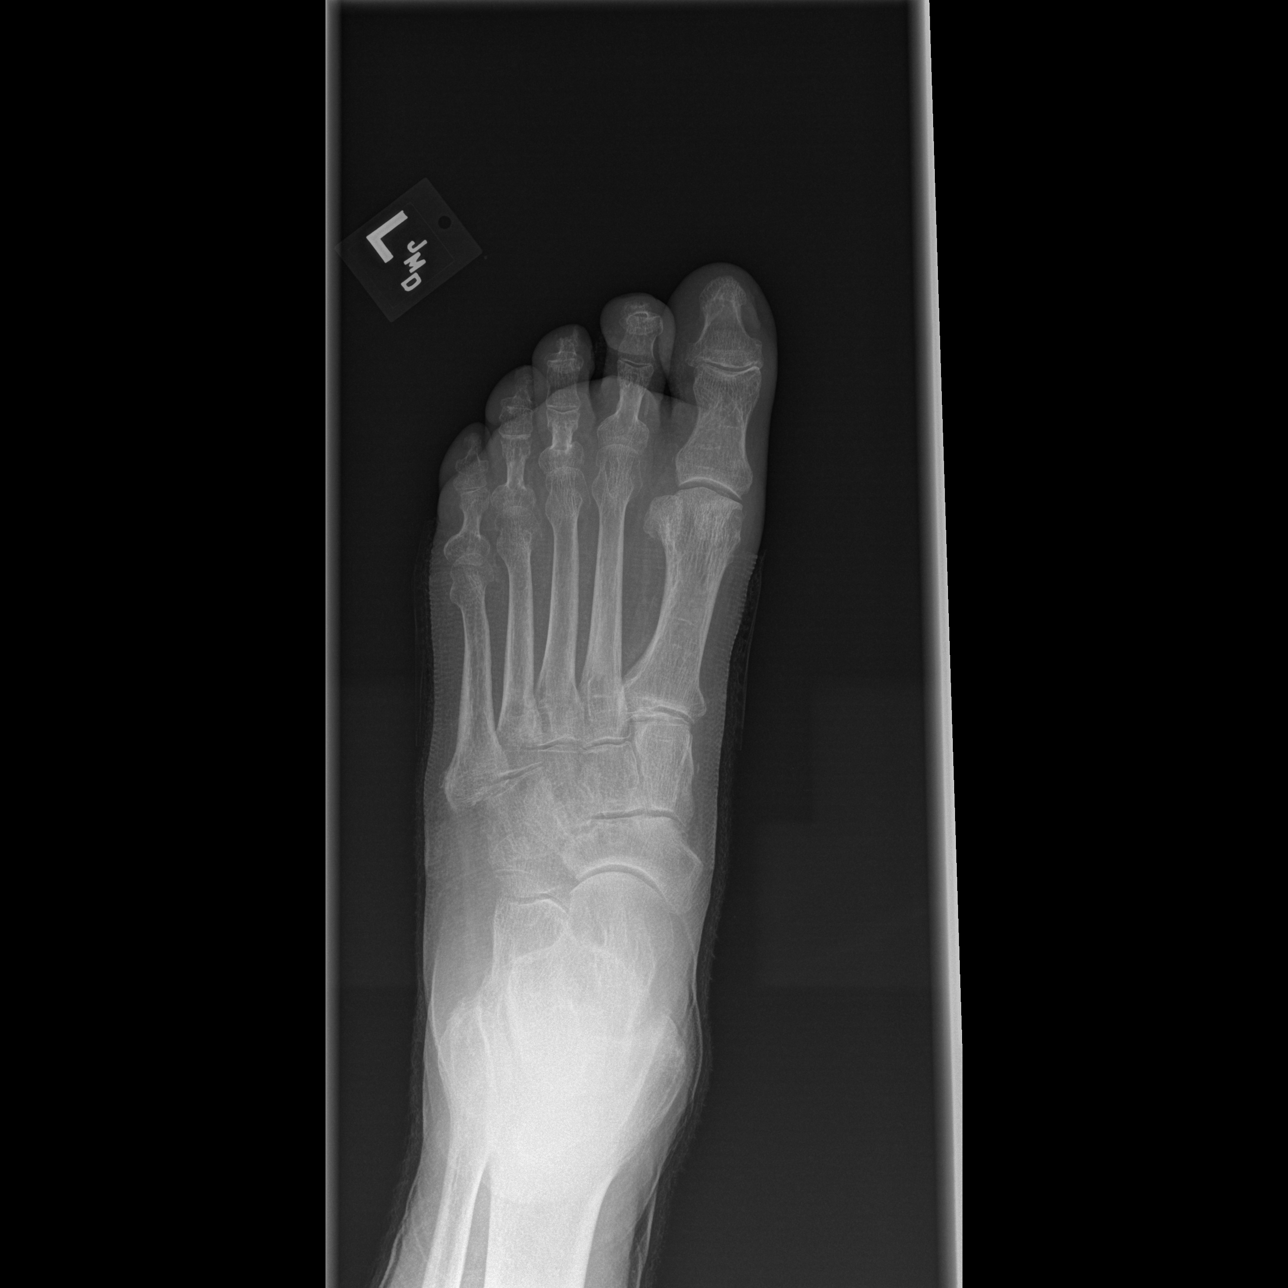

[t foot oblique left]
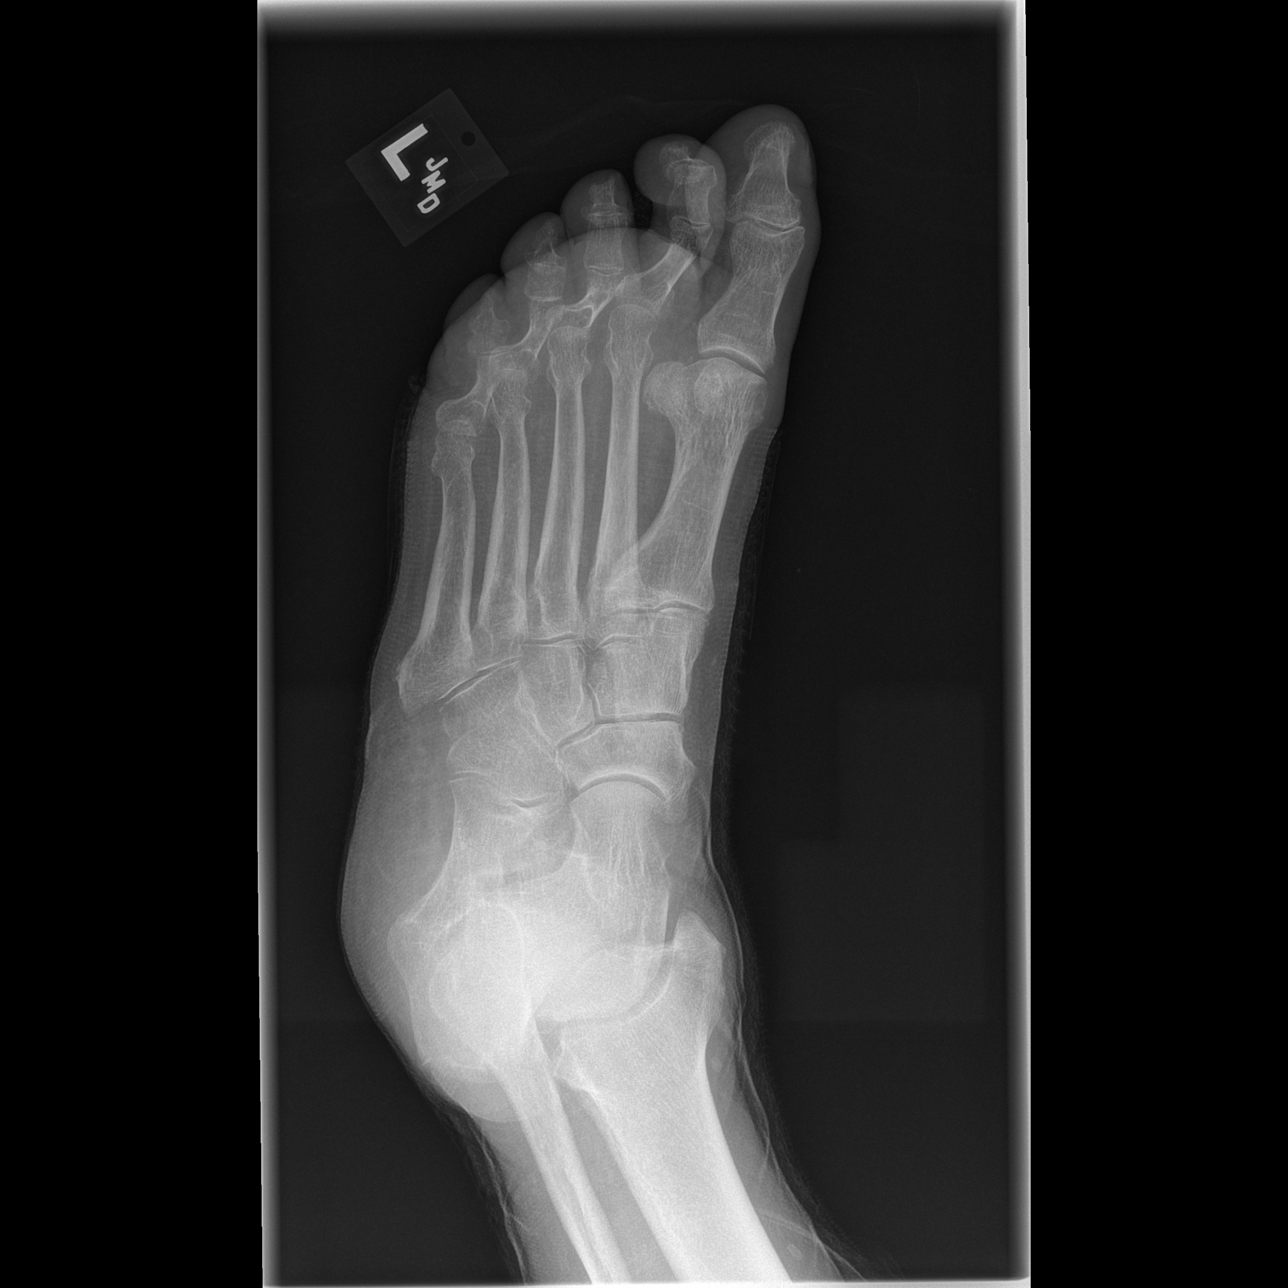

[t foot lat left]
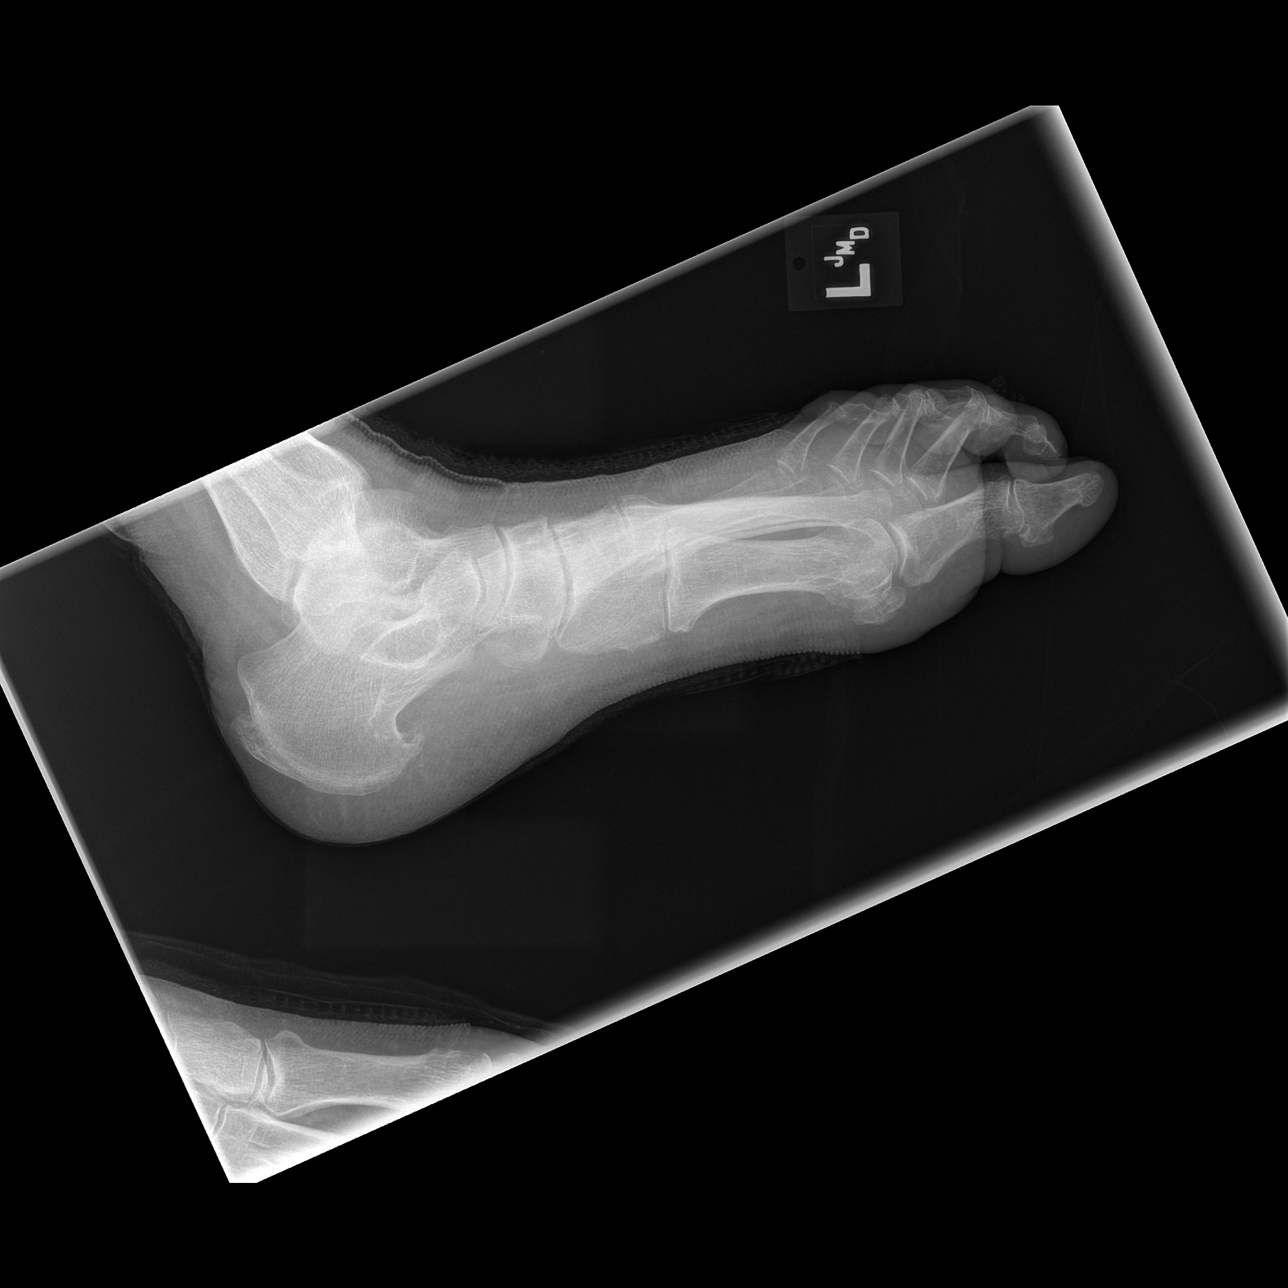

[3 of 3 positions shown; findings below may reference images not displayed]

FINDINGS: There is no evidence of fracture or dislocation. Chronic appearing
deformity of the tuft of the third digit distal phalanx. No definite
cortical destruction or periostitis is identified. Mild degenerative
changes throughout the foot. Bidirectional calcaneal enthesophytes.
No focal soft tissue swelling or evidence of soft tissue gas.
IMPRESSION: 1. No radiographic evidence for osteomyelitis. If high clinical
suspicion for acute osteomyelitis, consider further evaluation with
MRI.
2. Chronic appearing deformity of the tuft of the third digit distal
phalanx.

## 2020-10-02 ENCOUNTER — Telehealth: Payer: Self-pay | Admitting: *Deleted

## 2020-10-02 NOTE — Telephone Encounter (Signed)
Please give pt's wife a call @ [phone]

## 2020-10-02 NOTE — Telephone Encounter (Signed)
Wife wanted to change appt time.  Accommadated wife's request ans appt moved.

## 2020-10-09 ENCOUNTER — Ambulatory Visit: Payer: Medicare HMO | Admitting: Student

## 2020-10-09 ENCOUNTER — Other Ambulatory Visit: Payer: Self-pay

## 2020-10-09 ENCOUNTER — Encounter: Payer: Self-pay | Admitting: Student

## 2020-10-09 ENCOUNTER — Ambulatory Visit (INDEPENDENT_AMBULATORY_CARE_PROVIDER_SITE_OTHER): Payer: Medicare HMO | Admitting: *Deleted

## 2020-10-09 VITALS — BP 102/62 | HR 92 | Resp 16 | Ht 74.5 in | Wt 193.4 lb

## 2020-10-09 DIAGNOSIS — I4821 Permanent atrial fibrillation: Secondary | ICD-10-CM

## 2020-10-09 DIAGNOSIS — I5022 Chronic systolic (congestive) heart failure: Secondary | ICD-10-CM | POA: Diagnosis not present

## 2020-10-09 DIAGNOSIS — Z5181 Encounter for therapeutic drug level monitoring: Secondary | ICD-10-CM

## 2020-10-09 DIAGNOSIS — I2510 Atherosclerotic heart disease of native coronary artery without angina pectoris: Secondary | ICD-10-CM

## 2020-10-09 DIAGNOSIS — I10 Essential (primary) hypertension: Secondary | ICD-10-CM | POA: Diagnosis not present

## 2020-10-09 LAB — POCT INR: INR: 3.4 — AB (ref 2.0–3.0)

## 2020-10-09 NOTE — Progress Notes (Signed)
Cardiology Office Note    Date:  10/09/2020   ID:  [name], DOB 1941/01/19, MRN [id number]  PCP:  Daniel Albee, MD  Cardiologist: Daniel Sable, MD (Inactive)  --> Patient had previously requested to switch to Dr. Domenic Gross EP: Dr. Lovena Gross  Chief Complaint  Patient presents with  . Follow-up    4 month visit    History of Present Illness:    [name] is a 80 y.o. male with past medical history of chronic systolic CHF/NICM (EF 40% in 2016, at 20-25% by repeat echo in 08/2019, 25-30% in 12/2019, s/p Medtronic Bi-V ICD on 02/29/2020), CAD (nonobstructive CAD by cath in 09/2019), permanent atrial fibrillation (on Coumadin), HTN, and HLD who presents to the office today for 50-monthfollow-up.   He was last examined by myself in 05/2020 and denied any chest pain or dyspnea at that time. His lower extremity edema had improved with utilization of compression stockings. He was continued on Coreg 37.554mBID, Digoxin 0.12547maily, Entresto 24-52m7mD, Lasix 20mg68mly and Spironolactone 25mg 1my with plans for a repeat echocardiogram given recent Bi-V ICD placement.   Repeat echocardiogram in 08/2020 showed his EF had improved to 35-40% with mild MR and normal RV function. His device was interrogated in 08/2020 as well and thoracic impendence was normal at that time.   In talking with the patient and his wife today, he reports overall doing well from a cardiac perspective since his last visit. He is not overly active at baseline but reports his breathing has been stable when performing his routine activities around the house. No recent chest pain or palpitations. No orthopnea, PND or lower extremity edema. He wears compression stockings on a daily basis.   His BP was initially recorded at 92/50 during today's visit, rechecked and at 102/62. He denies any associated lightheadedness, dizziness or presyncope.  Past Medical History:  Diagnosis Date  . [place] (HCC)  [place]  (congestive heart failure) (HCC)  [place]. EF 45% in 2016 b. EF at 20-25% by repeat echo in 08/2019 with cath in 09/2019 showing nonobstructive CAD  . Chronic atrial fibrillation (HCC)  [place]   . Diabetes mellitus, type II (HCC)  [place] insulin  . Gout   . History of herpes zoster virus   . History of radiation therapy 12/21/12- 02/15/13   prostate 78 gray in 40 fx  . Hyperlipidemia   . Hypertension   . Prostate cancer (HCC) [place]   EBRT + hormonal therapy  . Vitamin D deficiency disease 06/07/2019    Past Surgical History:  Procedure Laterality Date  . AMPUTATION Bilateral 12/18/2019   Procedure: BILATERAL 2ND TOE AMPUTATION DIGIT;  Surgeon: [name],[name]  [facility];[place];  Laterality: Bilateral;  . BIV ICD INSERTION CRT-D N/A 02/29/2020   Procedure: BIV ICD INSERTION CRT-D;  Surgeon: TaylorEvans Gross [facility];  Service: Cardiovascular;  Laterality: N/A;  . CATARACT EXTRACTION, BILATERAL    . KNEE SURGERY     left knee  . PROSTATE BIOPSY    . RIGHT/LEFT HEART CATH AND CORONARY ANGIOGRAPHY N/A 10/04/2019   Procedure: RIGHT/LEFT HEART CATH AND CORONARY ANGIOGRAPHY;  Surgeon: [name],[name] [facility];  Service: Cardiovascular;  Laterality: N/A;    Current Medications: Outpatient Medications Prior to Visit  Medication Sig Dispense Refill  . acetaminophen (TYLENOL) 500 MG tablet  Take 500-1,000 mg by mouth every 6 (six) hours as needed for mild pain or headache.    . allopurinol (ZYLOPRIM) 300 MG tablet Take 2 tablets (600 mg total) by mouth daily. 60 tablet 1  . Ascorbic Acid 500 MG CAPS Take 1,000 mg by mouth daily.    . Blood Glucose Monitoring Suppl (TRUE METRIX METER) w/Device KIT USE AS DIRECTED 1 kit 0  . carvedilol (COREG) 25 MG tablet Take 1.5 tablets (37.5 mg total) by mouth 2 (two) times daily. 270 tablet 3  . cetaphil (CETAPHIL) lotion Apply 1 application topically as needed for dry skin (leg).     . Cholecalciferol (VITAMIN D-3) 125 MCG (5000 UT) TABS Take 5,000 Units by mouth daily.     . digoxin (LANOXIN) 0.125 MG tablet Take 1 tablet (0.125 mg total) by mouth daily. 90 tablet 3  . furosemide (LASIX) 20 MG tablet Take 1 tablet (20 mg total) by mouth daily. 90 tablet 3  . glipiZIDE (GLUCOTROL) 5 MG tablet TAKE 1 TABLET EVERY DAY 90 tablet 0  . lovastatin (MEVACOR) 40 MG tablet TAKE 1 TABLET AT BEDTIME 90 tablet 1  . MANGANESE PO Take by mouth in the morning, at noon, and at bedtime.    . NON FORMULARY Take 3 tablets by mouth daily. MANGA-CAL    . sacubitril-valsartan (ENTRESTO) 24-26 MG Take 1 tablet by mouth 2 (two) times daily. 60 tablet 6  . spironolactone (ALDACTONE) 25 MG tablet Take 1 tablet (25 mg total) by mouth daily. 90 tablet 3  . TRUE METRIX BLOOD GLUCOSE TEST test strip TEST BLOOD SUGAR EVERY DAY 100 strip 3  . TRUEplus Lancets 33G MISC TEST BLOOD SUGAR EVERY DAY 100 each 3  . vitamin E 400 UNIT capsule Take 400 Units by mouth daily.    Marland Kitchen warfarin (COUMADIN) 5 MG tablet TAKE 1/2 TABLET ON SATURDAYS AND TAKE 1 TABLET ALL OTHER DAYS 85 tablet 1  . allopurinol (ZYLOPRIM) 300 MG tablet TAKE 1 TABLET EVERY DAY (Patient not taking: Reported on 10/09/2020) 90 tablet 1   No facility-administered medications prior to visit.     Allergies:   Clarithromycin, Fluoride preparations, and Toprol xl [metoprolol tartrate]   Social History   Socioeconomic History  . Marital status: Married    Spouse name: Not on file  . Number of children: Not on file  . Years of education: Not on file  . Highest education level: Not on file  Occupational History  . Not on file  Tobacco Use  . Smoking status: Never Smoker  . Smokeless tobacco: Never Used  Vaping Use  . Vaping Use: Never used  Substance and Sexual Activity  . Alcohol use: No    Alcohol/week: 0.0 standard drinks  . Drug use: No  . Sexual activity: Not on file  Other Topics Concern  . Not on file  Social History Narrative   . Not on file   Social Determinants of Health   Financial Resource Strain: Not on file  Food Insecurity: Not on file  Transportation Needs: Not on file  Physical Activity: Not on file  Stress: Not on file  Social Connections: Not on file     Family History:  The patient's family history includes Heart attack in his father; Kidney failure in his mother.   Review of Systems:   Please see the history of present illness.     General:  No chills, fever, night sweats or weight changes.  Cardiovascular:  No chest pain, dyspnea  on exertion, edema, orthopnea, palpitations, paroxysmal nocturnal dyspnea. Dermatological: No rash, lesions/masses Respiratory: No cough, dyspnea Urologic: No hematuria, dysuria Abdominal:   No nausea, vomiting, diarrhea, bright red blood per rectum, melena, or hematemesis Neurologic:  No visual changes, wkns, changes in mental status. All other systems reviewed and are otherwise negative except as noted above.   Physical Exam:    VS:  BP 102/62   Pulse 92   Resp 16   Ht 6' 2.5" (1.892 m)   Wt 193 lb 6.4 oz (87.7 kg)   SpO2 99%   BMI 24.50 kg/m    General: Well developed, well nourished,male appearing in no acute distress. Head: Normocephalic, atraumatic. Neck: No carotid bruits. JVD not elevated.  Lungs: Respirations regular and unlabored, without wheezes or rales.  Heart: Irregularly irregular. No S3 or S4.  No murmur, no rubs, or gallops appreciated. Abdomen: Appears non-distended. No obvious abdominal masses. Msk:  Strength and tone appear normal for age. No obvious joint deformities or effusions. Extremities: No clubbing or cyanosis. Compression wraps in place.  Neuro: Alert and oriented X 3. Moves all extremities spontaneously. No focal deficits noted. Psych:  Responds to questions appropriately with a normal affect. Skin: No rashes or lesions noted  Wt Readings from Last 3 Encounters:  10/09/20 193 lb 6.4 oz (87.7 kg)  08/28/20 185 lb 12.8  oz (84.3 kg)  07/10/20 196 lb 6.4 oz (89.1 kg)     Studies/Labs Reviewed:   EKG:  EKG is not ordered today.    Recent Labs: 02/27/2020: Hemoglobin 12.4; Platelets 179 09/04/2020: ALT 18; BUN 26; Creat 1.02; Potassium 4.3; Sodium 141   Lipid Panel    Component Value Date/Time   CHOL 161 09/04/2020 1324   TRIG 127 09/04/2020 1324   TRIG 110 01/21/2009 0000   HDL 41 09/04/2020 1324   CHOLHDL 3.9 09/04/2020 1324   VLDL 18 02/13/2011 1055   LDLCALC 98 09/04/2020 1324   [place] 152 01/21/2009 0000    Additional studies/ records that were reviewed today include:   Cardiac Catheterization: 09/2019  Widely patent coronary arteries, essentially normal for the patient's age.  Trivial mid LAD less than 25% eccentric plaque.  Mild pulmonary hypertension with mean pulmonary artery pressure of 31 mmHg  Severe left ventricular systolic dysfunction with EF less than 25%.  Elevated LVEDP at 21 mmHg consistent with acute on chronic combined systolic and diastolic heart failure.  RECOMMENDATIONS:   Guideline directed therapy for heart failure and atrial fibrillation rate control.  Heart rate was relatively fast during the procedure.  Resume usual Coumadin dose later today.  Discharge home later today.   Echocardiogram: 08/2020 IMPRESSIONS    1. Left ventricular ejection fraction, by estimation, is 35 to 40%. The  left ventricle has normal function. The left ventricle demonstrates global  hypokinesis. There is mild left ventricular hypertrophy. Left ventricular  diastolic parameters are  indeterminate.  2. Right ventricular systolic function is normal. The right ventricular  size is normal. There is normal pulmonary artery systolic pressure.  3. Left atrial size was severely dilated.  4. Right atrial size was severely dilated.  5. The mitral valve is normal in structure. Mild mitral valve  regurgitation. No evidence of mitral stenosis.  6. The aortic valve was not well  visualized. There is severe calcifcation  of the aortic valve. There is severe thickening of the aortic valve.  Aortic valve regurgitation is not visualized. No aortic stenosis is  present.  7. The inferior vena cava  is normal in size with greater than 50%  respiratory variability, suggesting right atrial pressure of 3 mmHg.   Assessment:    1. Chronic systolic congestive heart failure ([place])   2. Coronary artery disease involving native coronary artery of native heart without angina pectoris   3. Permanent atrial fibrillation ([place])   4. Essential hypertension      Plan:   In order of problems listed above:  1. Chronic Systolic CHF/NICM - His EF was previously reduced at at 20-25% by echo in 08/2019, at 25-30% in 12/2019 and he underwent Medtronic Bi-V ICD on 02/29/2020. Most recent echo last month showed his EF had improved to 35-40%. His device is followed by Dr. Lovena Gross.  - He denies any recent orthopnea, PND or lower extremity edema. Appears euvolemic by examination today and most recent device check showed normal thoracic impedence.  - Continue current medication regimen with Coreg 37.3m BID, Digoxin 0.1239mdaily, Entresto 24-2629mID, Lasix 5m85mily and Spironolactone 25mg22mly.  2. CAD - He had nonobstructive CAD by cath in 09/2019. He denies any recent anginal symptoms. He is not on ASA given the need for anticoagulation. Remains on Lovastatin 40 mg daily.  3. Permanent Atrial Fibrillation - He denies any recent palpitations and heart rate is well controlled in the 90's during today's visit. Continue Coreg 37.5 mg twice daily for rate control. - He denies any evidence of active bleeding and remains on Coumadin for anticoagulation. [facility] was [facility]. His INR was checked in our office and sent to the [facility] for review as it was elevated to 3.4. Dosing was adjusted and he was scheduled for a follow-up INR check in 3  weeks.   4. HTN - BP is well controlled at 102/62 during today's visit. Continue current medication regimen.   Medication Adjustments/Labs and Tests Ordered: Current medicines are reviewed at length with the patient today.  Concerns regarding medicines are outlined above.  Medication changes, Labs and Tests ordered today are listed in the Patient Instructions below. Patient Instructions  Medication Instructions:  Your physician recommends that you continue on your current medications as directed. Please refer to the Current Medication list given to you today.  *If you need a refill on your cardiac medications before your next appointment, please call your pharmacy*   Lab Work: NONE   If you have labs (blood work) drawn today and your tests are completely normal, you will receive your results only by: . MyCMarland Kitchenart Message (if you have MyChart) OR . A paper copy in the mail If you have any lab test that is abnormal or we need to change your treatment, we will call you to review the results.   Testing/Procedures: NONE   Follow-Up: At [facility] and your health needs are our priority.  As part of our continuing mission to provide you with exceptional heart care, we have created designated Provider Care Teams.  These Care Teams include your primary Cardiologist (physician) and Advanced Practice Providers (APPs -  Physician Assistants and Nurse Practitioners) who all work together to provide you with the care you need, when you need it.  We recommend signing up for the patient portal called "MyChart".  Sign up information is provided on this After Visit Summary.  MyChart is used to connect with patients for Virtual Visits (Telemedicine).  Patients are able to view lab/test results, encounter notes, upcoming appointments, etc.  Non-urgent messages can be  sent to your provider as well.   To learn more about what you can do with MyChart, go to [email or web address].    Your next  appointment:   4 month(s)  The format for your next appointment:   In Person  Provider:   Rozann Lesches, MD or Bernerd Pho, PA-C   Other Instructions Thank you for choosing [facility]!     Signed, Erma Heritage, PA-C  10/09/2020 4:32 PM    [place] [address] [address] [place], [place] [address] Phone: [phone] Fax: [phone]

## 2020-10-09 NOTE — Patient Instructions (Signed)
Decrease warfarin to 1 tablet daily except 1/2 tablet on Wednesdays Recheck in 3 weeks

## 2020-10-09 NOTE — Patient Instructions (Addendum)
Medication Instructions:  Your physician recommends that you continue on your current medications as directed. Please refer to the Current Medication list given to you today.  Decrease Warfarin back to 5mg  daily except 2.5mg  on Wednesdays   *If you need a refill on your cardiac medications before your next appointment, please call your pharmacy*   Lab Work: NONE   If you have labs (blood work) drawn today and your tests are completely normal, you will receive your results only by: Marland Kitchen MyChart Message (if you have MyChart) OR . A paper copy in the mail If you have any lab test that is abnormal or we need to change your treatment, we will call you to review the results.   Testing/Procedures: NONE   Follow-Up: At [facility], you and your health needs are our priority.  As part of our continuing mission to provide you with exceptional heart care, we have created designated Provider Care Teams.  These Care Teams include your primary Cardiologist (physician) and Advanced Practice Providers (APPs -  Physician Assistants and Nurse Practitioners) who all work together to provide you with the care you need, when you need it.  We recommend signing up for the patient portal called "MyChart".  Sign up information is provided on this After Visit Summary.  MyChart is used to connect with patients for Virtual Visits (Telemedicine).  Patients are able to view lab/test results, encounter notes, upcoming appointments, etc.  Non-urgent messages can be sent to your provider as well.   To learn more about what you can do with MyChart, go to [email or web address].    Your next appointment:   4 month(s)  The format for your next appointment:   In Person  Provider:   Rozann Lesches, MD or Bernerd Pho, PA-C   Other Instructions Thank you for choosing [facility]!

## 2020-10-11 ENCOUNTER — Ambulatory Visit (INDEPENDENT_AMBULATORY_CARE_PROVIDER_SITE_OTHER): Payer: Medicare HMO

## 2020-10-11 DIAGNOSIS — I5022 Chronic systolic (congestive) heart failure: Secondary | ICD-10-CM

## 2020-10-11 DIAGNOSIS — Z95810 Presence of automatic (implantable) cardiac defibrillator: Secondary | ICD-10-CM | POA: Diagnosis not present

## 2020-10-11 NOTE — Progress Notes (Signed)
EPIC Encounter for ICM Monitoring  Patient Name: [name] is a 80 y.o. male Date: 10/11/2020 Primary Care Physican: Doree Albee, MD Primary Cardiologist:[place] Electrophysiologist:Taylor Bi-V Pacing:96.7% 12/14/2021Weight: 185lbs    Spoke with wife and patient is doing well.  Optivol thoracic impedance normal.  Prescribed:  Furosemide20 mg take 1 tablet by mouth daily Spironolactone 25 mg take 1 tablet daily  Labs: 09/04/2020 Creatinine 1.02, BUN 26, Potassium 4.3, Sodium 141, GFR 70-81  07/22/2020 Creatinine 1.16, BUN 35, Potassium 5.3, Sodium 140, GFR 60-70  07/10/2020 Creatinine 1.10, BUN 29, Potassium 5.4, Sodium 143, GFR 64-74  03/20/2020 Creatinine1.08, BUN24, Potassium4.9, Sodium139, MVH84-69 02/27/2020 Creatinine1.09, BUN21, Potassium4.3, Sodium138, GFR>60 A complete set of results can be found in Results Review.  Recommendations: No changes and encouraged to call if experiencing any fluid symptoms.  Follow-up plan: ICM clinic phone appointment on2/21/2022. 91 day device clinic remote transmission 11/28/2020.   EP/Cardiology Office Visits:02/06/2021 with Dr Domenic Polite.   Copy of ICM check sent to Dr.Taylor.   3 month ICM trend: 10/11/2020.    1 Year ICM trend:       Rosalene Billings, RN 10/11/2020 8:32 AM

## 2020-10-30 ENCOUNTER — Other Ambulatory Visit: Payer: Self-pay

## 2020-10-30 ENCOUNTER — Ambulatory Visit (INDEPENDENT_AMBULATORY_CARE_PROVIDER_SITE_OTHER): Payer: Medicare HMO | Admitting: *Deleted

## 2020-10-30 ENCOUNTER — Telehealth: Payer: Self-pay | Admitting: Physician Assistant

## 2020-10-30 ENCOUNTER — Telehealth: Payer: Self-pay | Admitting: *Deleted

## 2020-10-30 ENCOUNTER — Other Ambulatory Visit (HOSPITAL_COMMUNITY)
Admission: RE | Admit: 2020-10-30 | Discharge: 2020-10-30 | Disposition: A | Discharge status: Home / Self Care | Payer: Medicare HMO | Source: Ambulatory Visit | Attending: Student | Admitting: Student

## 2020-10-30 ENCOUNTER — Telehealth (INDEPENDENT_AMBULATORY_CARE_PROVIDER_SITE_OTHER): Payer: Self-pay

## 2020-10-30 ENCOUNTER — Ambulatory Visit: Payer: Medicare HMO | Admitting: Podiatry

## 2020-10-30 ENCOUNTER — Other Ambulatory Visit: Payer: Self-pay | Admitting: Podiatry

## 2020-10-30 ENCOUNTER — Ambulatory Visit (INDEPENDENT_AMBULATORY_CARE_PROVIDER_SITE_OTHER): Payer: Medicare HMO

## 2020-10-30 DIAGNOSIS — Z7901 Long term (current) use of anticoagulants: Secondary | ICD-10-CM | POA: Insufficient documentation

## 2020-10-30 DIAGNOSIS — E0843 Diabetes mellitus due to underlying condition with diabetic autonomic (poly)neuropathy: Secondary | ICD-10-CM

## 2020-10-30 DIAGNOSIS — M86171 Other acute osteomyelitis, right ankle and foot: Secondary | ICD-10-CM

## 2020-10-30 DIAGNOSIS — I4821 Permanent atrial fibrillation: Secondary | ICD-10-CM

## 2020-10-30 DIAGNOSIS — Z5181 Encounter for therapeutic drug level monitoring: Secondary | ICD-10-CM | POA: Insufficient documentation

## 2020-10-30 DIAGNOSIS — M869 Osteomyelitis, unspecified: Secondary | ICD-10-CM

## 2020-10-30 DIAGNOSIS — Z79899 Other long term (current) drug therapy: Secondary | ICD-10-CM | POA: Insufficient documentation

## 2020-10-30 DIAGNOSIS — M86172 Other acute osteomyelitis, left ankle and foot: Secondary | ICD-10-CM | POA: Diagnosis not present

## 2020-10-30 LAB — PROTIME-INR
INR: 6.3 (ref 0.8–1.2)
Prothrombin Time: 53.7 seconds — ABNORMAL HIGH (ref 11.4–15.2)

## 2020-10-30 LAB — POCT INR: INR: 8 — AB (ref 2.0–3.0)

## 2020-10-30 MED ORDER — SULFAMETHOXAZOLE-TRIMETHOPRIM 800-160 MG PO TABS: 1.0000 | ORAL_TABLET | Freq: Two times a day (BID) | ORAL | 0 refills | Status: DC

## 2020-10-30 NOTE — Telephone Encounter (Signed)
Patients wife Hoyle Sauer brought in a form from [employer] and stated that he needs to have surgery and needs this form/information ASAP.  Forms placed in doctor folder.

## 2020-10-30 NOTE — Telephone Encounter (Signed)
Patient's wife is calling and wanting physician to know that patient has an bleeding swollen left great toe w/ odor, looks terrible, has been going on since 2-3 weeks.Please advise.  Scheduled/confirmed  appointment for 1:45 today w/ Dr Amalia Hailey

## 2020-10-30 NOTE — H&P (View-Only) (Signed)
   Subjective:  80 y.o. male with PMHx of diabetes mellitus complicated past medical history presenting today for an ulcer that is developed to the patient's left great toe.  He is unaware or unsure how long the wound has been present for.  He presents today with his wife.  He currently has not done anything for treatment.  He presents for further treatment evaluation   Past Medical History:  Diagnosis Date  . Cancer (HCC)   . CHF (congestive heart failure) (HCC)    a. EF 45% in 2016 b. EF at 20-25% by repeat echo in 08/2019 with cath in 09/2019 showing nonobstructive CAD  . Chronic atrial fibrillation (HCC)   . Chronic bronchitis   . Diabetes mellitus, type II (HCC)    no insulin  . Gout   . History of herpes zoster virus   . History of radiation therapy 12/21/12- 02/15/13   prostate 78 gray in 40 fx  . Hyperlipidemia   . Hypertension   . Prostate cancer (HCC) 2014   EBRT + hormonal therapy  . Vitamin D deficiency disease 06/07/2019      Objective/Physical Exam General: The patient is alert and oriented x3 in no acute distress.  Dermatology:  Wound #1 noted to the distal tip of the left great toe measuring approximately 3.0 x 3.0 x 0.3 cm (LxWxD).   To the noted ulceration(s), there is no eschar. There is a moderate amount of slough, fibrin, and necrotic tissue noted.  Minimal granulation tissue noted.  There is exposed bone extending and protruding out past the wound.  Heavy drainage noted.  Mild malodor noted.  Vascular: Diminished pedal pulses bilaterally.  Patient has a known history of peripheral vascular disease.  He did have second toe amputations 12/18/2019 which demonstrated good significant healing and adequate flow  Neurological: Epicritic and protective threshold absent bilaterally.   Musculoskeletal Exam: History of bilateral second toe amputations  Radiographic exam: Cortical erosion noted to the distal phalanx of the distal tip of the left hallux.  Findings  consistent with osteomyelitis  Assessment: 1.  Ulcer left hallux secondary to diabetes mellitus 2. diabetes mellitus w/ peripheral neuropathy 3.  Osteomyelitis left hallux   Plan of Care:  1. Patient was evaluated. 2. Today we discussed the conservative versus surgical management of the presenting pathology. The patient opts for surgical management. All possible complications and details of the procedure were explained. All patient questions were answered. No guarantees were expressed or implied. 3. Authorization for surgery was initiated today. Surgery will consist of left great toe amputation 4.  Cultures taken and sent to pathology for culture and sensitivity 5.  Prescription for Bactrim DS 2 times daily #20 6.  Surgical shoe dispensed. 7.  Return to clinic 1 week postop    Girtrude Enslin M. Dakai Braithwaite, DPM [facility]  Dr. Lynessa Almanzar M. Maryruth Apple, DPM    [address]                                      [place], [place] [address]                Office [phone]  Fax [phone]       

## 2020-10-30 NOTE — Addendum Note (Signed)
Addended by: Lind Guest on: 10/30/2020 02:43 PM   Modules accepted: Orders

## 2020-10-30 NOTE — Progress Notes (Signed)
   Subjective:  80 y.o. male with PMHx of diabetes mellitus complicated past medical history presenting today for an ulcer that is developed to the patient's left great toe.  He is unaware or unsure how long the wound has been present for.  He presents today with his wife.  He currently has not done anything for treatment.  He presents for further treatment evaluation   Past Medical History:  Diagnosis Date  . Cancer ([place])   . CHF (congestive heart failure) ([place])    a. EF 45% in 2016 b. EF at 20-25% by repeat echo in 08/2019 with cath in 09/2019 showing nonobstructive CAD  . Chronic atrial fibrillation ([place])   . Chronic bronchitis   . Diabetes mellitus, type II ([place])    no insulin  . Gout   . History of herpes zoster virus   . History of radiation therapy 12/21/12- 02/15/13   prostate 78 gray in 40 fx  . Hyperlipidemia   . Hypertension   . Prostate cancer ([place]) 2014   EBRT + hormonal therapy  . Vitamin D deficiency disease 06/07/2019      Objective/Physical Exam General: The patient is alert and oriented x3 in no acute distress.  Dermatology:  Wound #1 noted to the distal tip of the left great toe measuring approximately 3.0 x 3.0 x 0.3 cm (LxWxD).   To the noted ulceration(s), there is no eschar. There is a moderate amount of slough, fibrin, and necrotic tissue noted.  Minimal granulation tissue noted.  There is exposed bone extending and protruding out past the wound.  Heavy drainage noted.  Mild malodor noted.  Vascular: Diminished pedal pulses bilaterally.  Patient has a known history of peripheral vascular disease.  He did have second toe amputations 12/18/2019 which demonstrated good significant healing and adequate flow  Neurological: Epicritic and protective threshold absent bilaterally.   Musculoskeletal Exam: History of bilateral second toe amputations  Radiographic exam: Cortical erosion noted to the distal phalanx of the distal tip of the left hallux.  Findings  consistent with osteomyelitis  Assessment: 1.  Ulcer left hallux secondary to diabetes mellitus 2. diabetes mellitus w/ peripheral neuropathy 3.  Osteomyelitis left hallux   Plan of Care:  1. Patient was evaluated. 2. Today we discussed the conservative versus surgical management of the presenting pathology. The patient opts for surgical management. All possible complications and details of the procedure were explained. All patient questions were answered. No guarantees were expressed or implied. 3. Authorization for surgery was initiated today. Surgery will consist of left great toe amputation 4.  Cultures taken and sent to pathology for culture and sensitivity 5.  Prescription for Bactrim DS 2 times daily #20 6.  Surgical shoe dispensed. 7.  Return to clinic 1 week postop    Edrick Kins, DPM [facility]  Dr. Edrick Kins, DPM    [address] [place] [place], [place] [address]                Office [phone]  Fax [phone]

## 2020-10-30 NOTE — Telephone Encounter (Signed)
Paged by lab to report an INR of 6.3. This was a re-draw today following an INR of 8.0. I attempted to call the patient and reached Mrs. [name], who had just gotten the car stuck in the yard. She asked me to call back.   Returned call to patient. I have advised him to hold his coumadin tonight and to reach out to the coumadin clinic tomorrow for further instructions. He denies bleeding, lightheadedness, or weakness. We reviewed ER precautions. He will not take coumadin until he speaks with coumadin clinic tomorrow.

## 2020-10-31 NOTE — Patient Instructions (Signed)
POC INR >8.0   Sent to [facility] for STAT PT/INR  6.3 Pt states he might have taken to much medicine. Pt/Wife told to HOLD warfarin tonight. 10/30/20 Hold warfarin 3 more days 2/3 - 2/5.  Take warfarin 5mg  on Sunday and come for INR check on Monday 2/7   Bleeding and fall precautions discussed and they verbalized understanding and need to go to ED if bleeding occurs or he has a fall.  He denies any signs of bleeding at this time.

## 2020-11-03 LAB — WOUND CULTURE
MICRO NUMBER:: 11486917
SPECIMEN QUALITY:: ADEQUATE

## 2020-11-03 LAB — HOUSE ACCOUNT TRACKING

## 2020-11-04 ENCOUNTER — Telehealth: Payer: Self-pay

## 2020-11-04 ENCOUNTER — Ambulatory Visit (INDEPENDENT_AMBULATORY_CARE_PROVIDER_SITE_OTHER): Payer: Medicare HMO | Admitting: *Deleted

## 2020-11-04 DIAGNOSIS — I4821 Permanent atrial fibrillation: Secondary | ICD-10-CM | POA: Diagnosis not present

## 2020-11-04 DIAGNOSIS — Z5181 Encounter for therapeutic drug level monitoring: Secondary | ICD-10-CM | POA: Diagnosis not present

## 2020-11-04 LAB — POCT INR: INR: 1.9 — AB (ref 2.0–3.0)

## 2020-11-04 NOTE — Telephone Encounter (Signed)
DOS 11/13/2020  AMPUTATION TOE MPJ JOINT LT - 28820  [employer]  The following codes do not require a pre-authorization Created on 11/04/2020  Service info 28820 Amputation, toe; metatarsophalangeal joint

## 2020-11-04 NOTE — Patient Instructions (Signed)
Pending amputation Lt great toe on 11/13/20 Waiting to hear from surgeon office on when to stop warfarin On Bactrim x 10 days Take warfarin 1 tablet daily except 1/2 tablet on Tuesdays, Thursdays and Saturday until finished with antibiotic then resume 1 tablet daily except 1/2 tablet on Wednesday and Saturday, Stop warfarin according to surgeon instructions.

## 2020-11-05 ENCOUNTER — Ambulatory Visit (INDEPENDENT_AMBULATORY_CARE_PROVIDER_SITE_OTHER): Payer: Medicare HMO | Admitting: Internal Medicine

## 2020-11-06 ENCOUNTER — Ambulatory Visit (INDEPENDENT_AMBULATORY_CARE_PROVIDER_SITE_OTHER): Payer: Medicare HMO | Admitting: Nurse Practitioner

## 2020-11-06 ENCOUNTER — Encounter: Payer: Self-pay | Admitting: Internal Medicine

## 2020-11-06 ENCOUNTER — Other Ambulatory Visit: Payer: Self-pay

## 2020-11-06 ENCOUNTER — Encounter (HOSPITAL_COMMUNITY)
Admission: RE | Admit: 2020-11-06 | Discharge: 2020-11-06 | Disposition: A | Discharge status: Home / Self Care | Payer: Medicare HMO | Source: Ambulatory Visit | Attending: Podiatry | Admitting: Podiatry

## 2020-11-06 ENCOUNTER — Encounter (HOSPITAL_COMMUNITY): Payer: Self-pay

## 2020-11-06 ENCOUNTER — Encounter (INDEPENDENT_AMBULATORY_CARE_PROVIDER_SITE_OTHER): Payer: Self-pay | Admitting: Nurse Practitioner

## 2020-11-06 VITALS — BP 126/70 | HR 80 | Temp 96.8°F | Resp 20 | Ht 74.5 in | Wt 172.0 lb

## 2020-11-06 DIAGNOSIS — I4891 Unspecified atrial fibrillation: Secondary | ICD-10-CM | POA: Insufficient documentation

## 2020-11-06 DIAGNOSIS — I5032 Chronic diastolic (congestive) heart failure: Secondary | ICD-10-CM | POA: Diagnosis not present

## 2020-11-06 DIAGNOSIS — M868X7 Other osteomyelitis, ankle and foot: Secondary | ICD-10-CM | POA: Insufficient documentation

## 2020-11-06 DIAGNOSIS — E118 Type 2 diabetes mellitus with unspecified complications: Secondary | ICD-10-CM | POA: Diagnosis not present

## 2020-11-06 DIAGNOSIS — Z7984 Long term (current) use of oral hypoglycemic drugs: Secondary | ICD-10-CM | POA: Insufficient documentation

## 2020-11-06 DIAGNOSIS — I2510 Atherosclerotic heart disease of native coronary artery without angina pectoris: Secondary | ICD-10-CM | POA: Insufficient documentation

## 2020-11-06 DIAGNOSIS — Z01818 Encounter for other preprocedural examination: Secondary | ICD-10-CM | POA: Diagnosis not present

## 2020-11-06 DIAGNOSIS — Z79899 Other long term (current) drug therapy: Secondary | ICD-10-CM | POA: Diagnosis not present

## 2020-11-06 DIAGNOSIS — I110 Hypertensive heart disease with heart failure: Secondary | ICD-10-CM | POA: Diagnosis not present

## 2020-11-06 DIAGNOSIS — Z7901 Long term (current) use of anticoagulants: Secondary | ICD-10-CM | POA: Insufficient documentation

## 2020-11-06 DIAGNOSIS — Z01812 Encounter for preprocedural laboratory examination: Secondary | ICD-10-CM | POA: Diagnosis not present

## 2020-11-06 HISTORY — DX: Peripheral vascular disease, unspecified: I73.9

## 2020-11-06 HISTORY — DX: Presence of automatic (implantable) cardiac defibrillator: Z95.810

## 2020-11-06 LAB — CBC
HCT: 40.3 % (ref 39.0–52.0)
Hemoglobin: 13.1 g/dL (ref 13.0–17.0)
MCH: 33.8 pg (ref 26.0–34.0)
MCHC: 32.5 g/dL (ref 30.0–36.0)
MCV: 103.9 fL — ABNORMAL HIGH (ref 80.0–100.0)
Platelets: 389 10*3/uL (ref 150–400)
RBC: 3.88 MIL/uL — ABNORMAL LOW (ref 4.22–5.81)
RDW: 13.6 % (ref 11.5–15.5)
WBC: 11.8 10*3/uL — ABNORMAL HIGH (ref 4.0–10.5)
nRBC: 0 % (ref 0.0–0.2)

## 2020-11-06 LAB — BASIC METABOLIC PANEL
Anion gap: 11 (ref 5–15)
BUN: 42 mg/dL — ABNORMAL HIGH (ref 8–23)
CO2: 22 mmol/L (ref 22–32)
Calcium: 9.7 mg/dL (ref 8.9–10.3)
Chloride: 102 mmol/L (ref 98–111)
Creatinine, Ser: 2.02 mg/dL — ABNORMAL HIGH (ref 0.61–1.24)
GFR, Estimated: 33 mL/min — ABNORMAL LOW (ref >60)
Glucose, Bld: 171 mg/dL — ABNORMAL HIGH (ref 70–99)
Potassium: 5.7 mmol/L — ABNORMAL HIGH (ref 3.5–5.1)
Sodium: 135 mmol/L (ref 135–145)

## 2020-11-06 LAB — HEMOGLOBIN A1C
Hgb A1c MFr Bld: 7.4 % — ABNORMAL HIGH (ref 4.8–5.6)
Mean Plasma Glucose: 165.68 mg/dL

## 2020-11-06 LAB — GLUCOSE, CAPILLARY: Glucose-Capillary: 179 mg/dL — ABNORMAL HIGH (ref 70–99)

## 2020-11-06 NOTE — Progress Notes (Addendum)
Anesthesia Review:  PCP: DR Anastasio Champion- Seen by Laray Anger for H and P on 11/06/2020 on chart Cardiologist : DR Johnny Bridge  LOV 10/09/20- Bernerd Pho, PA  LOV  Chest x-ray : 03/14/2020  EKG :02/29/2020  Echo : 08/2020  Stress test: Cardiac Cath :  Activity level: no  Sleep Study/ CPAP : Fasting Blood Sugar :      / Checks Blood Sugar -- times a day:   Blood Thinner/ Instructions /Last Dose: ASA / Instructions/ Last Dose :  08/30/20- Last Device Check  02/29/20- Biv ICD placement  DM- type 2 - does not check glucose at home per pt  HGBA1C-11/06/20-7.4 On Coumadin - in H and P - Coumadin instructions are present in the office note.   Coumadin followed by Coumadin Clinic by [facility] in [facility] with wife via phone and she is aware of Coumadin preop instructions - to stop Coumadin 5 days prior per Laray Anger.   Pt lives in [place].  Wife is driver.  Called [facility] Covid testing site and gained approval for covid test to be done at [employer] short stay off of [address] at [employer].  Pamala Hurry and [place] both aware.  Carolyn placed appt for 0845 am on 11/11/2020.  Wife made aware of appt date, time and location.  Wife very thankful.   BMP done 11/06/2020 routed to DR Daylene Katayama.  Made Roanna Banning aware Potassium 5.7 at preop. Order given for ISTAT am of surgery. Order placed in epic.   Reviewed with wife preop instructions .  Wife voiced understanding.   Potassium  5.7 on 11/06/20.  Called Roanna Banning and made her aware.,  Routed to Dr Daylene Katayama.  Roanna Banning stated to route labs to PCP- Elyn Aquas, Np. Routed BMP to Laray Anger .  Called [facility] and spoke with Elyn Aquas in regards to [facility] results of 11/06/2020.  Aware potassium 5.7  Elyn Aquas, NP stated she would address lab  with supervising physician NO new orders given.  Device orders requested on 11/06/2020.   Spoke with wife after routing labs to Elyn Aquas ,NP and wife is aware of lab  results from 11/06/2020 because she stated Laray Anger had called her with lab results from 11/06/2020.

## 2020-11-06 NOTE — Progress Notes (Signed)
Subjective:  Patient ID: [name], male    DOB: 04-12-1941  Age: 80 y.o. MRN: [id number]  CC:  Chief Complaint  Patient presents with  . Pre-op Exam    11/13/20 - Toe removal by Dr. Daylene Katayama @ [facility]       HPI  This patient arrives today for the above.  He is scheduled to have the left great toe removed by Dr. Earley Favor next week due to osteomyelitis of the toe.  He is here for preop exam.  He does have a past medical history significant for CHF, atrial fibrillation, he has a pacemaker, hypertension, type 2 diabetes, and hyperlipidemia.  He does follow with cardiology regularly.  Last A1c was 6.9 this was collected approximately 4 months ago.  I do see in the computer that he is scheduled to have A1c rechecked today.  Last metabolic panel showed fairly normal kidney function, last LDL was 98.  Today, he has no complaints.  He denies angina, dyspnea, syncope, palpitations, PND, orthopnea, dizziness, cough, shortness of breath.  He reports that he is able to complete his ADLs at home independently, he feels that he can walk up a flight of stairs independently, he feels that if he had to do heavy housework such as scrubbing floors would be able to do this, he is unable to play strenuous sports.  Based on the revised cardiac risk index his score is approximately 1% for cardiac death, non fatal MI, and nonfatal cardiac arrest.  He is approximately 1.3% for MRI, V. fib, cardiac arrest, and heart block.  He denies any recent history of blood clot within the last year.  Past Medical History:  Diagnosis Date  . Cancer ([place])   . CHF (congestive heart failure) ([place])    a. EF 45% in 2016 b. EF at 20-25% by repeat echo in 08/2019 with cath in 09/2019 showing nonobstructive CAD  . Chronic atrial fibrillation ([place])   . Chronic bronchitis   . Diabetes mellitus, type II ([place])    no insulin  . Gout   . History of herpes zoster virus   . History of radiation therapy 12/21/12- 02/15/13    prostate [address] in 41 fx  . Hyperlipidemia   . Hypertension   . Prostate cancer ([place]) 2014   EBRT + hormonal therapy  . Vitamin D deficiency disease 06/07/2019      Family History  Problem Relation Age of Onset  . Kidney failure Mother   . Heart attack Father     Social History   Social History Narrative  . Not on file   Social History   Tobacco Use  . Smoking status: Never Smoker  . Smokeless tobacco: Never Used  Substance Use Topics  . Alcohol use: No    Alcohol/week: 0.0 standard drinks     Current Meds  Medication Sig  . acetaminophen (TYLENOL) 500 MG tablet Take 500 mg by mouth in the morning and at bedtime.  Marland Kitchen allopurinol (ZYLOPRIM) 300 MG tablet Take 2 tablets (600 mg total) by mouth daily. (Patient taking differently: Take 300 mg by mouth 2 (two) times daily.)  . Alpha-Lipoic Acid 200 MG CAPS Take 200 mg by mouth at bedtime.  . Ascorbic Acid 500 MG CAPS Take 500 mg by mouth in the morning and at bedtime.  . Blood Glucose Monitoring Suppl (TRUE METRIX METER) w/Device KIT USE AS DIRECTED  . cetaphil (CETAPHIL) lotion Apply 1 application topically as needed for  dry skin (leg).  . Cholecalciferol (VITAMIN D-3) 125 MCG (5000 UT) TABS Take 5,000 Units by mouth every evening.  . digoxin (LANOXIN) 0.125 MG tablet Take 1 tablet (0.125 mg total) by mouth daily. (Patient taking differently: Take 0.125 mg by mouth every evening.)  . furosemide (LASIX) 20 MG tablet Take 1 tablet (20 mg total) by mouth daily.  Marland Kitchen glipiZIDE (GLUCOTROL) 5 MG tablet TAKE 1 TABLET EVERY DAY (Patient taking differently: Take 5 mg by mouth daily.)  . lovastatin (MEVACOR) 40 MG tablet TAKE 1 TABLET AT BEDTIME (Patient taking differently: Take 40 mg by mouth at bedtime.)  . MAGNESIUM CHLORIDE-CALCIUM PO Take 3 tablets by mouth daily. MagneCal  . sacubitril-valsartan (ENTRESTO) 24-26 MG Take 1 tablet by mouth 2 (two) times daily.  Marland Kitchen sulfamethoxazole-trimethoprim (BACTRIM DS) 800-160 MG tablet Take 1  tablet by mouth 2 (two) times daily.  . TRUE METRIX BLOOD GLUCOSE TEST test strip TEST BLOOD SUGAR EVERY DAY  . TRUEplus Lancets 33G MISC TEST BLOOD SUGAR EVERY DAY  . vitamin E 400 UNIT capsule Take 400 Units by mouth every evening.  . warfarin (COUMADIN) 5 MG tablet TAKE 1/2 TABLET ON SATURDAYS AND TAKE 1 TABLET ALL OTHER DAYS (Patient taking differently: Take 5 mg by mouth as directed.)  . Zinc 50 MG TABS Take 25 mg by mouth at bedtime.    ROS:  Review of Systems  Constitutional: Negative for chills, fever and malaise/fatigue.  Respiratory: Negative for cough, shortness of breath and wheezing.   Cardiovascular: Negative for chest pain, palpitations, orthopnea, leg swelling and PND.  Gastrointestinal: Negative for abdominal pain and blood in stool.  Neurological: Negative for dizziness, seizures, loss of consciousness, weakness and headaches.     Objective:   Today's Vitals: BP 126/70   Pulse 80   Temp (!) 96.8 F (36 C)   Resp 20   Ht 6' 2.5" (1.892 m)   Wt 172 lb (78 kg)   SpO2 94%   BMI 21.79 kg/m  Vitals with BMI 11/06/2020 10/09/2020 08/28/2020  Height 6' 2.5" 6' 2.5" '6\' 0"'   Weight 172 lbs 193 lbs 6 oz 185 lbs 13 oz  BMI 21.8 51.70 01.74  Systolic 944 967 591  Diastolic 70 62 67  Pulse 80 92 67     Physical Exam Vitals reviewed.  Constitutional:      Appearance: Normal appearance.  HENT:     Head: Normocephalic and atraumatic.  Cardiovascular:     Rate and Rhythm: Normal rate. Rhythm irregular.     Heart sounds: Normal heart sounds. No murmur heard.   Pulmonary:     Effort: Pulmonary effort is normal.     Breath sounds: Normal breath sounds. No wheezing.  Musculoskeletal:     Cervical back: Neck supple.  Skin:    General: Skin is warm and dry.  Neurological:     Mental Status: He is alert and oriented to person, place, and time.  Psychiatric:        Mood and Affect: Mood normal.        Behavior: Behavior normal.        Thought Content: Thought content  normal.        Judgment: Judgment normal.          Assessment and Plan   1. Preoperative evaluation to rule out surgical contraindication      Plan: 1.  Based on his chronic medical conditions (hypertension, atrial fibrillation, CHF, type 2 diabetes, hyperlipidemia) I believe he is at  moderate-high risk for surgical complications.  With that said, the surgery he is preparing for is fairly low risk, his preop blood work came back showing hyperkalemia, creatinine above 2.0, and elevated BUN.  This is a significant change from last time blood work was collected.  I did consult with Dr. Anastasio Champion and recommendation is the patient stop his spironolactone and furosemide over the weekend and return to our office Monday morning to have blood work rechecked.  I have called the patient's wife and given her an update and she is aware and agreeable to bring patient back on Monday.  She was also given the on-call phone number so that way if over the weekend the patient starts to experience leg swelling, shortness of breath, chest pain, or feels unwell in any way they can call the on-call number for further assistance and recommendations.   During the office visit and before blood work results came back, I did discuss the patient's risk factors and that there is always a risk that he could have a negative outcome from the surgery.  In addition, I did discuss that he should stop his Coumadin 5 days before the procedure and hold it until at least 12 to 24 hours after the procedure assuming that homeostasis is achieved.  He tells me he understands.  We discussed signs and symptoms of blood clot, heart attack, and stroke and that if these were to occur that he needs to call 911 especially considering he is going to stop his Coumadin.  He and his wife tell me that they understand.  Unfortunately I do not have a an EKG available to me in the office today so I was unable to perform this today.  I have reached out to  the patient's cardiologist's office to see if they would be able to work him in for an EKG prior to his upcoming surgery.  I have also reached out to the patient's cardiologist to see if they recommend bridging with Lovenox to prevent thromboembolic events, however patient denies any blood clots within the last year so I do believe this would be necessary, will still request their input.  I have discussed this with Dr. Anastasio Champion and he is agreeable.  Tests ordered No orders of the defined types were placed in this encounter.     No orders of the defined types were placed in this encounter.   Patient to follow-up as scheduled in 6 weeks or sooner as needed.   Ailene Ards, NP

## 2020-11-06 NOTE — Patient Instructions (Signed)
Hold coumadin starting 5 days before your procedure. You may restart the coumadin 12-24 hours after the procedure as long as the bleeding after surgery has stopped. If you have any signs of a blood clot (swelling in legs, shortness of breath, acute severe headache, weakness/sensation changes to the body, difficulty speaking, difficulty swallowing etc.) call 911.

## 2020-11-06 NOTE — Progress Notes (Signed)
[facility] DEVICE PROGRAMMING   Patient Information: [name] [birth year]  MRN- [id number]  Left Great toe Amputation  11/13/2020 Dr Daylene Katayama  [facility]  Time [phone]     Cautery will be used during surgery  Position during surgery:   Please send documentation back to:  [facility] (Fax # [phone])  [facility]  Athena Masse, RN  11/06/2020 3:59 PM       Device Information:   Clinic EP Physician:   Cristopher Peru, MD Device Type:  Defibrillator Manufacturer and Phone #:  Medtronic: [id number] Pacemaker Dependent?:  No Date of Last Device Check:  10/01/20        Normal Device Function?:  Yes     Electrophysiologist's Recommendations:    Have magnet available.  Provide continuous ECG monitoring when magnet is used or reprogramming is to be performed.   Procedure should not interfere with device function.  No device programming or magnet placement needed.  Per Device Clinic Standing Orders, Drake Leach  11/06/2020 4:47 PM

## 2020-11-06 NOTE — Progress Notes (Signed)
DUE TO COVID-19 ONLY ONE VISITOR IS ALLOWED TO COME WITH YOU AND STAY IN THE WAITING ROOM ONLY DURING PRE OP AND PROCEDURE DAY OF SURGERY. THE 1 VISITOR  MAY VISIT WITH YOU AFTER SURGERY IN YOUR PRIVATE ROOM DURING VISITING HOURS ONLY!  YOU NEED TO HAVE A COVID 19 TEST ON____2/08/2021 __ @_______ , THIS TEST MUST BE DONE BEFORE SURGERY,  COVID TESTING SITE [address] [place] [address], IT IS ON THE RIGHT GOING OUT [address] APPROXIMATELY  2 MINUTES PAST [employer] ON THE RIGHT. ONCE YOUR COVID TEST IS COMPLETED,  PLEASE BEGIN THE QUARANTINE INSTRUCTIONS AS OUTLINED IN YOUR HANDOUT.                [name]  11/06/2020   Your procedure is scheduled on: 11/13/2020    Report to [facility] Main  Entrance   Report to admitting at   1000 AM     Call this number if you have problems the morning of surgery [phone]    Remember: Do not eat food , candy gum or mints :After Midnight. You may have clear liquids from midnight until 0900am     CLEAR LIQUID DIET   Foods Allowed                                                                       Coffee and tea, regular and decaf                              Plain Jell-O any favor except red or purple                                            Fruit ices (not with fruit pulp)                                      Iced Popsicles                                     Carbonated beverages, regular and diet                                    Cranberry, grape and apple juices Sports drinks like Gatorade Lightly seasoned clear broth or consume(fat free) Sugar, honey syrup   _____________________________________________________________________    BRUSH YOUR TEETH MORNING OF SURGERY AND RINSE YOUR MOUTH OUT, NO CHEWING GUM CANDY OR MINTS.     Take these medicines the morning of surgery with A SIP OF WATER: alloopurinol, coreg  DO NOT TAKE ANY DIABETIC MEDICATIONS DAY OF YOUR SURGERY                                You may not have any metal on your body  including hair pins and              piercings  Do not wear jewelry, make-up, lotions, powders or perfumes, deodorant             Do not wear nail polish on your fingernails.  Do not shave  48 hours prior to surgery.              Men may shave face and neck.   Do not bring valuables to the hospital. [facility].  Contacts, dentures or bridgework may not be worn into surgery.  Leave suitcase in the car. After surgery it may be brought to your room.     Patients discharged the day of surgery will not be allowed to drive home. IF YOU ARE HAVING SURGERY AND GOING HOME THE SAME DAY, YOU MUST HAVE AN ADULT TO DRIVE YOU HOME AND BE WITH YOU FOR 24 HOURS. YOU MAY GO HOME BY TAXI OR UBER OR ORTHERWISE, BUT AN ADULT MUST ACCOMPANY YOU HOME AND STAY WITH YOU FOR 24 HOURS.  Name and phone number of your driver:  Special Instructions: N/A              Please read over the following fact sheets you were given: _____________________________________________________________________  [facility] - Preparing for Surgery Before surgery, you can play an important role.  Because skin is not sterile, your skin needs to be as free of germs as possible.  You can reduce the number of germs on your skin by washing with CHG (chlorahexidine gluconate) soap before surgery.  CHG is an antiseptic cleaner which kills germs and bonds with the skin to continue killing germs even after washing. Please DO NOT use if you have an allergy to CHG or antibacterial soaps.  If your skin becomes reddened/irritated stop using the CHG and inform your nurse when you arrive at Short Stay. Do not shave (including legs and underarms) for at least 48 hours prior to the first CHG shower.  You may shave your face/neck. Please follow these instructions carefully:  1.  Shower with CHG Soap the night before surgery and the  morning of Surgery.  2.   If you choose to wash your hair, wash your hair first as usual with your  normal  shampoo.  3.  After you shampoo, rinse your hair and body thoroughly to remove the  shampoo.                           4.  Use CHG as you would any other liquid soap.  You can apply chg directly  to the skin and wash                       Gently with a scrungie or clean washcloth.  5.  Apply the CHG Soap to your body ONLY FROM THE NECK DOWN.   Do not use on face/ open                           Wound or open sores. Avoid contact with eyes, ears mouth and genitals (private parts).                       Wash face,  Development worker, international aid (private parts)  with your normal soap.             6.  Wash thoroughly, paying special attention to the area where your surgery  will be performed.  7.  Thoroughly rinse your body with warm water from the neck down.  8.  DO NOT shower/wash with your normal soap after using and rinsing off  the CHG Soap.                9.  Pat yourself dry with a clean towel.            10.  Wear clean pajamas.            11.  Place clean sheets on your bed the night of your first shower and do not  sleep with pets. Day of Surgery : Do not apply any lotions/deodorants the morning of surgery.  Please wear clean clothes to the hospital/surgery center.  FAILURE TO FOLLOW THESE INSTRUCTIONS MAY RESULT IN THE CANCELLATION OF YOUR SURGERY PATIENT SIGNATURE_________________________________  NURSE SIGNATURE__________________________________  ________________________________________________________________________

## 2020-11-07 ENCOUNTER — Encounter (INDEPENDENT_AMBULATORY_CARE_PROVIDER_SITE_OTHER): Payer: Self-pay | Admitting: Nurse Practitioner

## 2020-11-07 ENCOUNTER — Other Ambulatory Visit (INDEPENDENT_AMBULATORY_CARE_PROVIDER_SITE_OTHER): Payer: Self-pay | Admitting: Nurse Practitioner

## 2020-11-07 ENCOUNTER — Telehealth (INDEPENDENT_AMBULATORY_CARE_PROVIDER_SITE_OTHER): Payer: Self-pay | Admitting: Nurse Practitioner

## 2020-11-07 ENCOUNTER — Telehealth (INDEPENDENT_AMBULATORY_CARE_PROVIDER_SITE_OTHER): Payer: Self-pay | Admitting: Internal Medicine

## 2020-11-07 DIAGNOSIS — B999 Unspecified infectious disease: Secondary | ICD-10-CM

## 2020-11-07 MED ORDER — SULFAMETHOXAZOLE-TRIMETHOPRIM 400-80 MG PO TABS: 1.0000 | ORAL_TABLET | Freq: Two times a day (BID) | ORAL | 0 refills | Status: DC

## 2020-11-07 NOTE — Progress Notes (Signed)
Please call this patient's wife and let her know that because of the patient's kidney function, Dr. Anastasio Champion recommends that he reduce his dose of his antibiotic so as to not make the function worse. Thus, I have sent a prescription of a smaller dose of the Bactrim (antibiotic) to their pharmacy ([facility] in [place]). I have ordered enough tablets to get him through until Sunday. He should continue to take 1 tablet by mouth every 12 hours. In addition he should still hold his furosemide and spironolactone starting today and through Monday. Please let me know if she has any questions.

## 2020-11-07 NOTE — Telephone Encounter (Signed)
Pt has had the pfizer covid vaccine and going tomorrow to have his booster.

## 2020-11-07 NOTE — Progress Notes (Signed)
Called and lvm to call back

## 2020-11-07 NOTE — Telephone Encounter (Signed)
Patient's wife called back and told to stop the furosemide and the spironolactone until follow-up on Monday.

## 2020-11-07 NOTE — Progress Notes (Signed)
Wife Carolyn aware 

## 2020-11-08 ENCOUNTER — Ambulatory Visit (INDEPENDENT_AMBULATORY_CARE_PROVIDER_SITE_OTHER): Payer: Medicare HMO | Admitting: *Deleted

## 2020-11-08 ENCOUNTER — Other Ambulatory Visit: Payer: Self-pay

## 2020-11-08 DIAGNOSIS — I4821 Permanent atrial fibrillation: Secondary | ICD-10-CM

## 2020-11-08 DIAGNOSIS — Z23 Encounter for immunization: Secondary | ICD-10-CM | POA: Diagnosis not present

## 2020-11-08 NOTE — Progress Notes (Signed)
Pt in office for EKG as pre op

## 2020-11-11 ENCOUNTER — Telehealth: Payer: Self-pay | Admitting: Podiatry

## 2020-11-11 ENCOUNTER — Ambulatory Visit (INDEPENDENT_AMBULATORY_CARE_PROVIDER_SITE_OTHER): Payer: Medicare HMO | Admitting: Internal Medicine

## 2020-11-11 ENCOUNTER — Other Ambulatory Visit: Payer: Self-pay

## 2020-11-11 ENCOUNTER — Encounter (INDEPENDENT_AMBULATORY_CARE_PROVIDER_SITE_OTHER): Payer: Self-pay | Admitting: Internal Medicine

## 2020-11-11 ENCOUNTER — Other Ambulatory Visit (HOSPITAL_COMMUNITY)
Admission: RE | Admit: 2020-11-11 | Discharge: 2020-11-11 | Disposition: A | Discharge status: Home / Self Care | Payer: Medicare HMO | Source: Ambulatory Visit | Attending: Podiatry | Admitting: Podiatry

## 2020-11-11 VITALS — BP 134/90 | HR 100 | Ht 74.5 in | Wt 185.0 lb

## 2020-11-11 DIAGNOSIS — I10 Essential (primary) hypertension: Secondary | ICD-10-CM

## 2020-11-11 DIAGNOSIS — U071 COVID-19: Secondary | ICD-10-CM | POA: Insufficient documentation

## 2020-11-11 DIAGNOSIS — E119 Type 2 diabetes mellitus without complications: Secondary | ICD-10-CM

## 2020-11-11 DIAGNOSIS — I5042 Chronic combined systolic (congestive) and diastolic (congestive) heart failure: Secondary | ICD-10-CM | POA: Diagnosis not present

## 2020-11-11 DIAGNOSIS — Z01812 Encounter for preprocedural laboratory examination: Secondary | ICD-10-CM | POA: Diagnosis not present

## 2020-11-11 DIAGNOSIS — I4891 Unspecified atrial fibrillation: Secondary | ICD-10-CM | POA: Diagnosis not present

## 2020-11-11 LAB — SARS CORONAVIRUS 2 (TAT 6-24 HRS): SARS Coronavirus 2: POSITIVE — AB

## 2020-11-11 NOTE — Telephone Encounter (Signed)
Cherly from [facility] main OR department  Is calling about this patients amputation for Wednesday. She wanted to know if the patient Acell or if he needs anything  Please call [phone]

## 2020-11-11 NOTE — Progress Notes (Signed)
Metrics: Intervention Frequency ACO  Documented Smoking Status Yearly  Screened one or more times in 24 months  Cessation Counseling or  Active cessation medication Past 24 months  Past 24 months   Guideline developer: UpToDate (See UpToDate for funding source) Date Released: 2014       Wellness Office Visit  Subjective:  Patient ID: [name], male    DOB: [birth year]  Age: 80 y.o. MRN: [id number]  CC: This man comes in for follow-up regarding his renal dysfunction. HPI  He had seen [name] for preoperative assessment prior to his left toe amputation surgery that he is due to have in 2 days time.  Blood work shows hyperkalemia and worsening renal function.  We decided to discontinue furosemide and spironolactone over the weekend and he comes in now today to be reevaluated.  He is doing well.  He denies any worsening dyspnea or edema of his legs. Past Medical History:  Diagnosis Date  . AICD (automatic cardioverter/defibrillator) present   . Cancer ([place])   . CHF (congestive heart failure) ([place])    a. EF 45% in 2016 b. EF at 20-25% by repeat echo in 08/2019 with cath in 09/2019 showing nonobstructive CAD  . Chronic atrial fibrillation ([place])   . Chronic bronchitis   . Diabetes mellitus, type II ([place])    no insulin  . Gout   . History of herpes zoster virus   . History of radiation therapy 12/21/12- 02/15/13   prostate 78 gray in 40 fx  . Hyperlipidemia   . Hypertension   . Peripheral vascular disease ([place])   . Prostate cancer ([place]) 2014   EBRT + hormonal therapy  . Vitamin D deficiency disease 06/07/2019   Past Surgical History:  Procedure Laterality Date  . AMPUTATION Bilateral 12/18/2019   Procedure: BILATERAL 2ND TOE AMPUTATION DIGIT;  Surgeon: Edrick Kins, DPM;  Location: [facility];  Service: Podiatry;  Laterality: Bilateral;  . BIV ICD INSERTION CRT-D N/A 02/29/2020   Procedure: BIV ICD INSERTION CRT-D;  Surgeon: Evans Lance, MD;  Location: [facility];  Service:  Cardiovascular;  Laterality: N/A;  . CATARACT EXTRACTION, BILATERAL    . KNEE SURGERY     left knee  . PROSTATE BIOPSY    . RIGHT/LEFT HEART CATH AND CORONARY ANGIOGRAPHY N/A 10/04/2019   Procedure: RIGHT/LEFT HEART CATH AND CORONARY ANGIOGRAPHY;  Surgeon: Belva Crome, MD;  Location: [facility];  Service: Cardiovascular;  Laterality: N/A;     Family History  Problem Relation Age of Onset  . Kidney failure Mother   . Heart attack Father     Social History   Social History Narrative  . Not on file   Social History   Tobacco Use  . Smoking status: Never Smoker  . Smokeless tobacco: Never Used  Substance Use Topics  . Alcohol use: No    Alcohol/week: 0.0 standard drinks    Current Meds  Medication Sig  . acetaminophen (TYLENOL) 500 MG tablet Take 500 mg by mouth in the morning and at bedtime.  Marland Kitchen allopurinol (ZYLOPRIM) 300 MG tablet Take 2 tablets (600 mg total) by mouth daily. (Patient taking differently: Take 300 mg by mouth 2 (two) times daily.)  . Alpha-Lipoic Acid 200 MG CAPS Take 200 mg by mouth at bedtime.  . Ascorbic Acid 500 MG CAPS Take 500 mg by mouth in the morning and at bedtime.  . Blood Glucose Monitoring Suppl (TRUE METRIX METER) w/Device KIT USE AS DIRECTED  .  cetaphil (CETAPHIL) lotion Apply 1 application topically as needed for dry skin (leg).  . Cholecalciferol (VITAMIN D-3) 125 MCG (5000 UT) TABS Take 5,000 Units by mouth every evening.  . digoxin (LANOXIN) 0.125 MG tablet Take 1 tablet (0.125 mg total) by mouth daily. (Patient taking differently: Take 0.125 mg by mouth every evening.)  . glipiZIDE (GLUCOTROL) 5 MG tablet TAKE 1 TABLET EVERY DAY (Patient taking differently: Take 5 mg by mouth daily.)  . lovastatin (MEVACOR) 40 MG tablet TAKE 1 TABLET AT BEDTIME (Patient taking differently: Take 40 mg by mouth at bedtime.)  . MAGNESIUM CHLORIDE-CALCIUM PO Take 3 tablets by mouth daily. MagneCal  . sacubitril-valsartan (ENTRESTO) 24-26 MG Take 1  tablet by mouth 2 (two) times daily.  Marland Kitchen sulfamethoxazole-trimethoprim (BACTRIM) 400-80 MG tablet Take 1 tablet by mouth 2 (two) times daily.  . TRUE METRIX BLOOD GLUCOSE TEST test strip TEST BLOOD SUGAR EVERY DAY  . TRUEplus Lancets 33G MISC TEST BLOOD SUGAR EVERY DAY  . vitamin E 400 UNIT capsule Take 400 Units by mouth every evening.  . Zinc 50 MG TABS Take 25 mg by mouth at bedtime.       Objective:   Today's Vitals: BP 134/90   Pulse 100   Ht 6' 2.5" (1.892 m)   Wt 185 lb (83.9 kg)   SpO2 99%   BMI 23.43 kg/m  Vitals with BMI 11/11/2020 11/06/2020 11/06/2020  Height 6' 2.5" - 6' 2.5"  Weight 185 lbs - 172 lbs  BMI 20.25 - 42.7  Systolic 062 376 283  Diastolic 90 57 70  Pulse 151 80 80     Physical Exam  He looks chronically sick.  He has gained weight, according to our numbers, 13 pounds, since he was last seen by Judson Roch only 5 days ago.  Lung fields are entirely clear.  Saturation on room air is 99%.  He does not appear to have any excessive peripheral edema today.     Assessment   1. Chronic combined systolic and diastolic heart failure ([place])   2. Atrial fibrillation, unspecified type ([place])   3. Essential hypertension   4. Type 2 diabetes mellitus without complication, without long-term current use of insulin ([facility])       Tests ordered Orders Placed This Encounter  Procedures  . COMPLETE METABOLIC PANEL WITH GFR  . Protime-INR     Plan: 1. He will continue with [facility] for his heart failure and we may need to reinitiate furosemide and/or spironolactone depending on his blood work. 2. As far as his atrial fibrillation is concerned, warfarin has been discontinued approximately 3 days ago and we will check an INR today. 3. Further recommendations will depend on blood results but I have told the patient and his wife that I think he will be at mild-to-moderate risk of the upcoming left foot surgery.   No orders of the defined types were placed in this  encounter.   Doree Albee, MD

## 2020-11-12 ENCOUNTER — Telehealth: Payer: Self-pay | Admitting: *Deleted

## 2020-11-12 ENCOUNTER — Telehealth: Payer: Self-pay

## 2020-11-12 LAB — COMPLETE METABOLIC PANEL WITH GFR
AG Ratio: 1 (calc) (ref 1.0–2.5)
ALT: 29 U/L (ref 9–46)
AST: 24 U/L (ref 10–35)
Albumin: 3.8 g/dL (ref 3.6–5.1)
Alkaline phosphatase (APISO): 108 U/L (ref 35–144)
BUN/Creatinine Ratio: 28 (calc) — ABNORMAL HIGH (ref 6–22)
BUN: 45 mg/dL — ABNORMAL HIGH (ref 7–25)
CO2: 24 mmol/L (ref 20–32)
Calcium: 10 mg/dL (ref 8.6–10.3)
Chloride: 101 mmol/L (ref 98–110)
Creat: 1.63 mg/dL — ABNORMAL HIGH (ref 0.70–1.18)
GFR, Est African American: 46 mL/min/{1.73_m2} — ABNORMAL LOW (ref >=60)
GFR, Est Non African American: 40 mL/min/{1.73_m2} — ABNORMAL LOW (ref >=60)
Globulin: 3.8 g/dL (calc) — ABNORMAL HIGH (ref 1.9–3.7)
Glucose, Bld: 78 mg/dL (ref 65–139)
Potassium: 5.5 mmol/L — ABNORMAL HIGH (ref 3.5–5.3)
Sodium: 133 mmol/L — ABNORMAL LOW (ref 135–146)
Total Bilirubin: 0.4 mg/dL (ref 0.2–1.2)
Total Protein: 7.6 g/dL (ref 6.1–8.1)

## 2020-11-12 LAB — PROTIME-INR
INR: 1.2 — ABNORMAL HIGH
Prothrombin Time: 11.9 s — ABNORMAL HIGH (ref 9.0–11.5)

## 2020-11-12 NOTE — Telephone Encounter (Signed)
Please call Hoyle Sauer [phone]

## 2020-11-12 NOTE — Progress Notes (Signed)
Anesthesia Chart Review   Case: [id number] Date/Time: 11/13/20 1145   Procedure: AMPUTATION TOE MPJ JOINT LEFT FOOT (Left Toe)   Anesthesia type: Choice   Pre-op diagnosis: OSTEOMYOLYTIS LEFT FOOT   Location: [facility] ROOM 01 / [facility]   Surgeons: Edrick Kins, DPM      DISCUSSION:80 y.o. never smoker with h/o HTN, DM II, chronic systolic CHF/NICM (EF 10% in 2016, at 20-25% by repeat echo in 08/2019, 25-30% in 12/2019, s/p [employer] Bi-V ICD on 02/29/2020),  CAD (nonobstructive CAD by cath 09/2019), PVD, prostate cancer, atrial fibrillation (on Warfarin), left foot osteomyelitis scheduled for above procedure 11/13/2020 with Daylene Katayama, DPM.   AICD device orders in 11/06/2020 progress note, procedure should not interfere.   Pt last seen by cardiology 10/09/2020. Per OV note pt euvolemic with no cv sx.   At PAT visit 11/06/2020 pt with hyperkalemia and worsening creatinine.  Discussed with PCP who scheduled the pt to be seen.  Per notes sprionolactone and furosemide held.  On lab recheck 11/11/2020 creatinine improved from 2.02 to 1.63, potassium 5.5.  Per PCP note 11/11/2020, "Further recommendations will depend on blood results but I have told the patient and his wife that I think he will be at mild-to-moderate risk of the upcoming left foot surgery."  Discussed with Dr. Anastasio Champion who feels pt is safe to proceed with procedure with creatinine and potassium trending down.   VS: BP (!) 102/57   Pulse 80   Temp 36.9 C (Oral)   Resp 16   SpO2 98%   PROVIDERS: Doree Albee, MD is PCP    LABS: Labs repeated 11/11/2020, please review (all labs ordered are listed, but only abnormal results are displayed)  Labs Reviewed  HEMOGLOBIN A1C - Abnormal; Notable for the following components:      Result Value   Hgb A1c MFr Bld 7.4 (*)    All other components within normal limits  BASIC METABOLIC PANEL - Abnormal; Notable for the following components:   Potassium 5.7 (*)    Glucose, Bld 171 (*)    BUN 42  (*)    Creatinine, Ser 2.02 (*)    GFR, Estimated 33 (*)    All other components within normal limits  CBC - Abnormal; Notable for the following components:   WBC 11.8 (*)    RBC 3.88 (*)    MCV 103.9 (*)    All other components within normal limits  GLUCOSE, CAPILLARY - Abnormal; Notable for the following components:   Glucose-Capillary 179 (*)    All other components within normal limits     IMAGES:   EKG: 11/11/2020 80 bpm   CV: Echo 09/04/2020 IMPRESSIONS    1. Left ventricular ejection fraction, by estimation, is 35 to 40%. The  left ventricle has normal function. The left ventricle demonstrates global  hypokinesis. There is mild left ventricular hypertrophy. Left ventricular  diastolic parameters are  indeterminate.  2. Right ventricular systolic function is normal. The right ventricular  size is normal. There is normal pulmonary artery systolic pressure.  3. Left atrial size was severely dilated.  4. Right atrial size was severely dilated.  5. The mitral valve is normal in structure. Mild mitral valve  regurgitation. No evidence of mitral stenosis.  6. The aortic valve was not well visualized. There is severe calcifcation  of the aortic valve. There is severe thickening of the aortic valve.  Aortic valve regurgitation is not visualized. No aortic stenosis is  present.  7.  The inferior vena cava is normal in size with greater than 50%  respiratory variability, suggesting right atrial pressure of 3 mmHg.  Cardiac Cath 10/04/2019  Widely patent coronary arteries, essentially normal for the patient's age.  Trivial mid LAD less than 25% eccentric plaque.  Mild pulmonary hypertension with mean pulmonary artery pressure of 31 mmHg  Severe left ventricular systolic dysfunction with EF less than 25%.  Elevated LVEDP at 21 mmHg consistent with acute on chronic combined systolic and diastolic heart failure.  RECOMMENDATIONS:   Guideline directed therapy for  heart failure and atrial fibrillation rate control.  Heart rate was relatively fast during the procedure.  Resume usual Coumadin dose later today.  Discharge home later today.   Past Medical History:  Diagnosis Date  . AICD (automatic cardioverter/defibrillator) present   . Cancer ([place])   . CHF (congestive heart failure) ([place])    a. EF 45% in 2016 b. EF at 20-25% by repeat echo in 08/2019 with cath in 09/2019 showing nonobstructive CAD  . Chronic atrial fibrillation ([place])   . Chronic bronchitis   . Diabetes mellitus, type II ([place])    no insulin  . Gout   . History of herpes zoster virus   . History of radiation therapy 12/21/12- 02/15/13   prostate 78 gray in 40 fx  . Hyperlipidemia   . Hypertension   . Peripheral vascular disease ([place])   . Prostate cancer ([place]) 2014   EBRT + hormonal therapy  . Vitamin D deficiency disease 06/07/2019    Past Surgical History:  Procedure Laterality Date  . AMPUTATION Bilateral 12/18/2019   Procedure: BILATERAL 2ND TOE AMPUTATION DIGIT;  Surgeon: Edrick Kins, DPM;  Location: [facility];  Service: Podiatry;  Laterality: Bilateral;  . BIV ICD INSERTION CRT-D N/A 02/29/2020   Procedure: BIV ICD INSERTION CRT-D;  Surgeon: Evans Lance, MD;  Location: [facility];  Service: Cardiovascular;  Laterality: N/A;  . CATARACT EXTRACTION, BILATERAL    . KNEE SURGERY     left knee  . PROSTATE BIOPSY    . RIGHT/LEFT HEART CATH AND CORONARY ANGIOGRAPHY N/A 10/04/2019   Procedure: RIGHT/LEFT HEART CATH AND CORONARY ANGIOGRAPHY;  Surgeon: Belva Crome, MD;  Location: [facility];  Service: Cardiovascular;  Laterality: N/A;    MEDICATIONS: . acetaminophen (TYLENOL) 500 MG tablet  . allopurinol (ZYLOPRIM) 300 MG tablet  . Alpha-Lipoic Acid 200 MG CAPS  . Ascorbic Acid 500 MG CAPS  . Blood Glucose Monitoring Suppl (TRUE METRIX METER) w/Device KIT  . carvedilol (COREG) 25 MG tablet  . cetaphil (CETAPHIL) lotion  . Cholecalciferol (VITAMIN D-3) 125  MCG (5000 UT) TABS  . digoxin (LANOXIN) 0.125 MG tablet  . furosemide (LASIX) 20 MG tablet  . glipiZIDE (GLUCOTROL) 5 MG tablet  . lovastatin (MEVACOR) 40 MG tablet  . MAGNESIUM CHLORIDE-CALCIUM PO  . sacubitril-valsartan (ENTRESTO) 24-26 MG  . spironolactone (ALDACTONE) 25 MG tablet  . sulfamethoxazole-trimethoprim (BACTRIM) 400-80 MG tablet  . TRUE METRIX BLOOD GLUCOSE TEST test strip  . TRUEplus Lancets 33G MISC  . vitamin E 400 UNIT capsule  . warfarin (COUMADIN) 5 MG tablet  . Zinc 50 MG TABS   No current facility-administered medications for this encounter.     Konrad Felix, PA-C [facility] [phone]

## 2020-11-12 NOTE — Telephone Encounter (Signed)
Called to discuss with patient about COVID-19 symptoms and the use of one of the available treatments for those with mild to moderate Covid symptoms and at a high risk of hospitalization.  Pt appears to qualify for outpatient treatment due to co-morbid conditions and/or a member of an at-risk group in accordance with the FDA Emergency Use Authorization.    Symptom onset: No symptoms Vaccinated: Yes Booster? Yes Immunocompromised? No Qualifiers: CHF,Prostate cancer, HTN  Wife reports no symptoms.  [name]

## 2020-11-12 NOTE — Telephone Encounter (Signed)
Spoke with wife Hoyle Sauer.  Pt was scheduled for toe amputation tomorrow but he tested positive for Covid on pre-op labs.  He will quarantine x 5 days.  Surgery was rescheduled for 11/22/20.  INR 09/10/21 was 1.2.  Warfarin was on hold.  Told wife to have pt restart warfarin 5mg  daily tonight through Saturday 2/19 then stop. He will resume warfarin after surgery per surgeon taking 5mg  daily except 2.5mg  on Wednesdays and Saturdays.  INR appt moved to 12/02/20.  Wife verbalized understanding and appreciated call.

## 2020-11-13 NOTE — Progress Notes (Signed)
Spoke to patient and wife and gave updated arrival time of 1:00 PM for surgery on 11-22-20.  Patient advised no solid food after midnight and that he can have clear liquids from midnight until 12:00 PM day of surgery.  Patient stated he is to take last dose of Coumadin on 11-16-20.

## 2020-11-18 ENCOUNTER — Ambulatory Visit (INDEPENDENT_AMBULATORY_CARE_PROVIDER_SITE_OTHER): Payer: Medicare HMO

## 2020-11-18 DIAGNOSIS — I5022 Chronic systolic (congestive) heart failure: Secondary | ICD-10-CM | POA: Diagnosis not present

## 2020-11-18 DIAGNOSIS — Z95810 Presence of automatic (implantable) cardiac defibrillator: Secondary | ICD-10-CM | POA: Diagnosis not present

## 2020-11-19 ENCOUNTER — Encounter (INDEPENDENT_AMBULATORY_CARE_PROVIDER_SITE_OTHER): Payer: Self-pay | Admitting: Internal Medicine

## 2020-11-19 ENCOUNTER — Other Ambulatory Visit: Payer: Self-pay

## 2020-11-19 ENCOUNTER — Ambulatory Visit (INDEPENDENT_AMBULATORY_CARE_PROVIDER_SITE_OTHER): Payer: Medicare HMO | Admitting: Internal Medicine

## 2020-11-19 VITALS — BP 134/82 | HR 54 | Temp 97.3°F | Ht 74.5 in | Wt 184.0 lb

## 2020-11-19 DIAGNOSIS — I5042 Chronic combined systolic (congestive) and diastolic (congestive) heart failure: Secondary | ICD-10-CM

## 2020-11-19 DIAGNOSIS — Z7901 Long term (current) use of anticoagulants: Secondary | ICD-10-CM

## 2020-11-19 DIAGNOSIS — I10 Essential (primary) hypertension: Secondary | ICD-10-CM | POA: Diagnosis not present

## 2020-11-19 DIAGNOSIS — E875 Hyperkalemia: Secondary | ICD-10-CM

## 2020-11-19 NOTE — Progress Notes (Signed)
Metrics: Intervention Frequency ACO  Documented Smoking Status Yearly  Screened one or more times in 24 months  Cessation Counseling or  Active cessation medication Past 24 months  Past 24 months   Guideline developer: UpToDate (See UpToDate for funding source) Date Released: 2014       Wellness Office Visit  Subjective:  Patient ID: [name], male    DOB: [birth year]  Age: 80 y.o. MRN: [id number]  CC: This man comes in for follow-up regarding his hyperkalemia, congestive heart failure and medication adjustments. HPI  From the previous blood work, I recommended that the patient discontinue spironolactone but continue Lasix 20 mg daily.  The patient had stopped taking the warfarin 3 days ago.  The surgery now for his left foot is scheduled for this Friday. He has not noticed any increasing dyspnea with medication changes that I have made. Past Medical History:  Diagnosis Date  . AICD (automatic cardioverter/defibrillator) present   . Cancer ([place])   . CHF (congestive heart failure) ([place])    a. EF 45% in 2016 b. EF at 20-25% by repeat echo in 08/2019 with cath in 09/2019 showing nonobstructive CAD  . Chronic atrial fibrillation ([place])   . Chronic bronchitis   . Diabetes mellitus, type II ([place])    no insulin  . Gout   . History of herpes zoster virus   . History of radiation therapy 12/21/12- 02/15/13   prostate 78 gray in 40 fx  . Hyperlipidemia   . Hypertension   . Peripheral vascular disease ([place])   . Prostate cancer ([place]) 2014   EBRT + hormonal therapy  . Vitamin D deficiency disease 06/07/2019   Past Surgical History:  Procedure Laterality Date  . AMPUTATION Bilateral 12/18/2019   Procedure: BILATERAL 2ND TOE AMPUTATION DIGIT;  Surgeon: Edrick Kins, DPM;  Location: [facility];  Service: Podiatry;  Laterality: Bilateral;  . BIV ICD INSERTION CRT-D N/A 02/29/2020   Procedure: BIV ICD INSERTION CRT-D;  Surgeon: Evans Lance, MD;  Location: [facility];  Service:  Cardiovascular;  Laterality: N/A;  . CATARACT EXTRACTION, BILATERAL    . KNEE SURGERY     left knee  . PROSTATE BIOPSY    . RIGHT/LEFT HEART CATH AND CORONARY ANGIOGRAPHY N/A 10/04/2019   Procedure: RIGHT/LEFT HEART CATH AND CORONARY ANGIOGRAPHY;  Surgeon: Belva Crome, MD;  Location: [facility];  Service: Cardiovascular;  Laterality: N/A;     Family History  Problem Relation Age of Onset  . Kidney failure Mother   . Heart attack Father     Social History   Social History Narrative  . Not on file   Social History   Tobacco Use  . Smoking status: Never Smoker  . Smokeless tobacco: Never Used  Substance Use Topics  . Alcohol use: No    Alcohol/week: 0.0 standard drinks    Current Meds  Medication Sig  . acetaminophen (TYLENOL) 500 MG tablet Take 500 mg by mouth in the morning and at bedtime.  Marland Kitchen allopurinol (ZYLOPRIM) 300 MG tablet Take 2 tablets (600 mg total) by mouth daily. (Patient taking differently: Take 300 mg by mouth 2 (two) times daily.)  . Alpha-Lipoic Acid 200 MG CAPS Take 200 mg by mouth at bedtime.  . Ascorbic Acid 500 MG CAPS Take 500 mg by mouth in the morning and at bedtime.  . Blood Glucose Monitoring Suppl (TRUE METRIX METER) w/Device KIT USE AS DIRECTED  . cetaphil (CETAPHIL) lotion Apply 1 application  topically as needed for dry skin (leg).  . Cholecalciferol (VITAMIN D-3) 125 MCG (5000 UT) TABS Take 5,000 Units by mouth every evening.  . digoxin (LANOXIN) 0.125 MG tablet Take 1 tablet (0.125 mg total) by mouth daily. (Patient taking differently: Take 0.125 mg by mouth every evening.)  . furosemide (LASIX) 20 MG tablet Take 1 tablet (20 mg total) by mouth daily.  Marland Kitchen glipiZIDE (GLUCOTROL) 5 MG tablet TAKE 1 TABLET EVERY DAY (Patient taking differently: Take 5 mg by mouth daily.)  . lovastatin (MEVACOR) 40 MG tablet TAKE 1 TABLET AT BEDTIME (Patient taking differently: Take 40 mg by mouth at bedtime.)  . MAGNESIUM CHLORIDE-CALCIUM PO Take 3 tablets  by mouth daily. MagneCal  . sacubitril-valsartan (ENTRESTO) 24-26 MG Take 1 tablet by mouth 2 (two) times daily.  Marland Kitchen sulfamethoxazole-trimethoprim (BACTRIM) 400-80 MG tablet Take 1 tablet by mouth 2 (two) times daily.  . TRUE METRIX BLOOD GLUCOSE TEST test strip TEST BLOOD SUGAR EVERY DAY  . TRUEplus Lancets 33G MISC TEST BLOOD SUGAR EVERY DAY  . vitamin E 400 UNIT capsule Take 400 Units by mouth every evening.  . Zinc 50 MG TABS Take 25 mg by mouth at bedtime.       Objective:   Today's Vitals: BP 134/82   Pulse (!) 54   Temp (!) 97.3 F (36.3 C) (Temporal)   Ht 6' 2.5" (1.892 m)   Wt 184 lb (83.5 kg)   BMI 23.31 kg/m  Vitals with BMI 11/19/2020 11/11/2020 11/06/2020  Height 6' 2.5" 6' 2.5" -  Weight 184 lbs 185 lbs -  BMI 13.24 40.10 -  Systolic 272 536 644  Diastolic 82 90 57  Pulse 54 100 80     Physical Exam  His weight is fairly steady and is not gaining significant amount of weight.  Lung fields are entirely clear.  He is not acutely in congestive heart failure clinically.     Assessment   1. Chronic combined systolic and diastolic heart failure ([place])   2. Serum potassium elevated   3. Essential hypertension   4. Chronic anticoagulation       Tests ordered Orders Placed This Encounter  Procedures  . COMPLETE METABOLIC PANEL WITH GFR  . Protime-INR     Plan: 1. He will continue with all present medications and we will check electrolytes and prothrombin time. 2. From my standpoint, he should be in reasonable medical condition to go ahead with the left foot surgery, depending upon blood results from today.   No orders of the defined types were placed in this encounter.   Doree Albee, MD

## 2020-11-20 ENCOUNTER — Encounter: Payer: Medicare HMO | Admitting: Podiatry

## 2020-11-20 LAB — COMPLETE METABOLIC PANEL WITH GFR
AG Ratio: 1.1 (calc) (ref 1.0–2.5)
ALT: 33 U/L (ref 9–46)
AST: 26 U/L (ref 10–35)
Albumin: 3.8 g/dL (ref 3.6–5.1)
Alkaline phosphatase (APISO): 107 U/L (ref 35–144)
BUN/Creatinine Ratio: 22 (calc) (ref 6–22)
BUN: 26 mg/dL — ABNORMAL HIGH (ref 7–25)
CO2: 27 mmol/L (ref 20–32)
Calcium: 9.4 mg/dL (ref 8.6–10.3)
Chloride: 101 mmol/L (ref 98–110)
Creat: 1.2 mg/dL — ABNORMAL HIGH (ref 0.70–1.18)
GFR, Est African American: 67 mL/min/{1.73_m2} (ref >=60)
GFR, Est Non African American: 58 mL/min/{1.73_m2} — ABNORMAL LOW (ref >=60)
Globulin: 3.5 g/dL (calc) (ref 1.9–3.7)
Glucose, Bld: 162 mg/dL — ABNORMAL HIGH (ref 65–139)
Potassium: 4.7 mmol/L (ref 3.5–5.3)
Sodium: 137 mmol/L (ref 135–146)
Total Bilirubin: 0.6 mg/dL (ref 0.2–1.2)
Total Protein: 7.3 g/dL (ref 6.1–8.1)

## 2020-11-20 LAB — PROTIME-INR
INR: 1.2 — ABNORMAL HIGH
Prothrombin Time: 12 s — ABNORMAL HIGH (ref 9.0–11.5)

## 2020-11-20 NOTE — Progress Notes (Signed)
Please call the patient and wife this morning and tell them that blood work is much improved and he should have no problem going ahead with the surgery.

## 2020-11-20 NOTE — Progress Notes (Signed)
Return call to wife. Stated pt can have clearance for surgery ;TBA. Wife asked  to have Dr Anastasio Champion to fill out the form & fax back to the surgery provider s office. Form is in Red folder left up in basket.

## 2020-11-22 ENCOUNTER — Ambulatory Visit (HOSPITAL_COMMUNITY): Payer: Medicare HMO | Admitting: Anesthesiology

## 2020-11-22 ENCOUNTER — Encounter (HOSPITAL_COMMUNITY)
Admission: RE | Disposition: A | Discharge status: Home / Self Care | Payer: Self-pay | Source: Home / Self Care | Attending: Podiatry

## 2020-11-22 ENCOUNTER — Ambulatory Visit (HOSPITAL_COMMUNITY)
Admission: RE | Admit: 2020-11-22 | Discharge: 2020-11-22 | Disposition: A | Discharge status: Home / Self Care | Payer: Medicare HMO | Attending: Podiatry | Admitting: Podiatry

## 2020-11-22 ENCOUNTER — Encounter (HOSPITAL_COMMUNITY): Payer: Self-pay | Admitting: Podiatry

## 2020-11-22 ENCOUNTER — Ambulatory Visit (HOSPITAL_COMMUNITY): Payer: Medicare HMO | Admitting: Physician Assistant

## 2020-11-22 DIAGNOSIS — I2510 Atherosclerotic heart disease of native coronary artery without angina pectoris: Secondary | ICD-10-CM | POA: Diagnosis not present

## 2020-11-22 DIAGNOSIS — Z794 Long term (current) use of insulin: Secondary | ICD-10-CM | POA: Diagnosis not present

## 2020-11-22 DIAGNOSIS — E559 Vitamin D deficiency, unspecified: Secondary | ICD-10-CM | POA: Diagnosis not present

## 2020-11-22 DIAGNOSIS — M869 Osteomyelitis, unspecified: Secondary | ICD-10-CM | POA: Diagnosis not present

## 2020-11-22 DIAGNOSIS — I482 Chronic atrial fibrillation, unspecified: Secondary | ICD-10-CM | POA: Insufficient documentation

## 2020-11-22 DIAGNOSIS — L570 Actinic keratosis: Secondary | ICD-10-CM | POA: Diagnosis not present

## 2020-11-22 DIAGNOSIS — E1142 Type 2 diabetes mellitus with diabetic polyneuropathy: Secondary | ICD-10-CM | POA: Diagnosis not present

## 2020-11-22 DIAGNOSIS — L089 Local infection of the skin and subcutaneous tissue, unspecified: Secondary | ICD-10-CM | POA: Diagnosis not present

## 2020-11-22 DIAGNOSIS — I110 Hypertensive heart disease with heart failure: Secondary | ICD-10-CM | POA: Diagnosis not present

## 2020-11-22 DIAGNOSIS — M86672 Other chronic osteomyelitis, left ankle and foot: Secondary | ICD-10-CM | POA: Diagnosis not present

## 2020-11-22 DIAGNOSIS — Z8546 Personal history of malignant neoplasm of prostate: Secondary | ICD-10-CM | POA: Insufficient documentation

## 2020-11-22 DIAGNOSIS — I509 Heart failure, unspecified: Secondary | ICD-10-CM | POA: Insufficient documentation

## 2020-11-22 DIAGNOSIS — E1151 Type 2 diabetes mellitus with diabetic peripheral angiopathy without gangrene: Secondary | ICD-10-CM | POA: Diagnosis not present

## 2020-11-22 DIAGNOSIS — E1169 Type 2 diabetes mellitus with other specified complication: Secondary | ICD-10-CM | POA: Insufficient documentation

## 2020-11-22 DIAGNOSIS — Z923 Personal history of irradiation: Secondary | ICD-10-CM | POA: Insufficient documentation

## 2020-11-22 DIAGNOSIS — E785 Hyperlipidemia, unspecified: Secondary | ICD-10-CM | POA: Diagnosis not present

## 2020-11-22 DIAGNOSIS — L859 Epidermal thickening, unspecified: Secondary | ICD-10-CM | POA: Diagnosis not present

## 2020-11-22 HISTORY — PX: AMPUTATION TOE: SHX6595

## 2020-11-22 LAB — CBC
HCT: 40 % (ref 39.0–52.0)
Hemoglobin: 12.7 g/dL — ABNORMAL LOW (ref 13.0–17.0)
MCH: 33.7 pg (ref 26.0–34.0)
MCHC: 31.8 g/dL (ref 30.0–36.0)
MCV: 106.1 fL — ABNORMAL HIGH (ref 80.0–100.0)
Platelets: 211 10*3/uL (ref 150–400)
RBC: 3.77 MIL/uL — ABNORMAL LOW (ref 4.22–5.81)
RDW: 14.6 % (ref 11.5–15.5)
WBC: 8.2 10*3/uL (ref 4.0–10.5)
nRBC: 0 % (ref 0.0–0.2)

## 2020-11-22 LAB — BASIC METABOLIC PANEL
Anion gap: 12 (ref 5–15)
BUN: 23 mg/dL (ref 8–23)
CO2: 20 mmol/L — ABNORMAL LOW (ref 22–32)
Calcium: 9 mg/dL (ref 8.9–10.3)
Chloride: 102 mmol/L (ref 98–111)
Creatinine, Ser: 1.15 mg/dL (ref 0.61–1.24)
GFR, Estimated: >60 mL/min (ref >60)
Glucose, Bld: 163 mg/dL — ABNORMAL HIGH (ref 70–99)
Potassium: 4.9 mmol/L (ref 3.5–5.1)
Sodium: 134 mmol/L — ABNORMAL LOW (ref 135–145)

## 2020-11-22 LAB — GLUCOSE, CAPILLARY
Glucose-Capillary: 175 mg/dL — ABNORMAL HIGH (ref 70–99)
Glucose-Capillary: 174 mg/dL — ABNORMAL HIGH (ref 70–99)

## 2020-11-22 LAB — PROTIME-INR
INR: 1.1 (ref 0.8–1.2)
Prothrombin Time: 13.7 seconds (ref 11.4–15.2)

## 2020-11-22 SURGERY — AMPUTATION, TOE
Anesthesia: Monitor Anesthesia Care | Site: Toe | Laterality: Left

## 2020-11-22 MED ORDER — LIDOCAINE HCL 2 % IJ SOLN
INTRAMUSCULAR | Status: AC
  Filled 2020-11-22: qty 20

## 2020-11-22 MED ORDER — FENTANYL CITRATE (PF) 100 MCG/2ML IJ SOLN: 50.0000 ug | INTRAMUSCULAR | Status: DC

## 2020-11-22 MED ORDER — CEFAZOLIN SODIUM-DEXTROSE 2-4 GM/100ML-% IV SOLN
2.0000 g | INTRAVENOUS | Status: AC
  Administered 2020-11-22: 2 g via INTRAVENOUS
  Filled 2020-11-22: qty 100

## 2020-11-22 MED ORDER — BUPIVACAINE HCL (PF) 0.5 % IJ SOLN
INTRAMUSCULAR | Status: AC
  Filled 2020-11-22: qty 30

## 2020-11-22 MED ORDER — PROPOFOL 10 MG/ML IV BOLUS
INTRAVENOUS | Status: DC | PRN
  Administered 2020-11-22: 20 mg via INTRAVENOUS

## 2020-11-22 MED ORDER — ORAL CARE MOUTH RINSE: 15.0000 mL | Freq: Once | OROMUCOSAL | Status: AC

## 2020-11-22 MED ORDER — MIDAZOLAM HCL 2 MG/2ML IJ SOLN
INTRAMUSCULAR | Status: AC
  Filled 2020-11-22: qty 2

## 2020-11-22 MED ORDER — ROPIVACAINE HCL 7.5 MG/ML IJ SOLN
INTRAMUSCULAR | Status: DC | PRN
  Administered 2020-11-22: 20 mL via PERINEURAL

## 2020-11-22 MED ORDER — ONDANSETRON HCL 4 MG/2ML IJ SOLN: 4.0000 mg | Freq: Once | INTRAMUSCULAR | Status: DC | PRN

## 2020-11-22 MED ORDER — LACTATED RINGERS IV SOLN: INTRAVENOUS | Status: DC

## 2020-11-22 MED ORDER — PHENYLEPHRINE 40 MCG/ML (10ML) SYRINGE FOR IV PUSH (FOR BLOOD PRESSURE SUPPORT)
PREFILLED_SYRINGE | INTRAVENOUS | Status: AC
  Filled 2020-11-22: qty 10

## 2020-11-22 MED ORDER — FENTANYL CITRATE (PF) 100 MCG/2ML IJ SOLN
INTRAMUSCULAR | Status: AC
  Administered 2020-11-22: 100 ug via INTRAVENOUS
  Filled 2020-11-22: qty 2

## 2020-11-22 MED ORDER — HYDROMORPHONE HCL 1 MG/ML IJ SOLN: 0.2500 mg | INTRAMUSCULAR | Status: DC | PRN

## 2020-11-22 MED ORDER — MIDAZOLAM HCL 5 MG/5ML IJ SOLN
INTRAMUSCULAR | Status: DC | PRN
  Administered 2020-11-22: 2 mg via INTRAVENOUS

## 2020-11-22 MED ORDER — ACETAMINOPHEN 10 MG/ML IV SOLN: 1000.0000 mg | Freq: Once | INTRAVENOUS | Status: DC | PRN

## 2020-11-22 MED ORDER — FENTANYL CITRATE (PF) 100 MCG/2ML IJ SOLN
INTRAMUSCULAR | Status: AC
  Filled 2020-11-22: qty 2

## 2020-11-22 MED ORDER — LIDOCAINE HCL (PF) 2 % IJ SOLN
INTRAMUSCULAR | Status: DC | PRN
  Administered 2020-11-22: 10 mL

## 2020-11-22 MED ORDER — 0.9 % SODIUM CHLORIDE (POUR BTL) OPTIME
TOPICAL | Status: DC | PRN
  Administered 2020-11-22: 1000 mL

## 2020-11-22 MED ORDER — OXYCODONE-ACETAMINOPHEN 5-325 MG PO TABS: 1.0000 | ORAL_TABLET | Freq: Four times a day (QID) | ORAL | 0 refills | Status: AC | PRN

## 2020-11-22 MED ORDER — PROPOFOL 500 MG/50ML IV EMUL
INTRAVENOUS | Status: DC | PRN
  Administered 2020-11-22: 35 ug/kg/min via INTRAVENOUS

## 2020-11-22 MED ORDER — CHLORHEXIDINE GLUCONATE 0.12 % MT SOLN
15.0000 mL | Freq: Once | OROMUCOSAL | Status: AC
  Administered 2020-11-22: 15 mL via OROMUCOSAL

## 2020-11-22 MED ORDER — PHENYLEPHRINE HCL-NACL 10-0.9 MG/250ML-% IV SOLN
INTRAVENOUS | Status: DC | PRN
  Administered 2020-11-22: 50 ug/min via INTRAVENOUS

## 2020-11-22 SURGICAL SUPPLY — 30 items
APL PRP STRL LF DISP 70% ISPRP (MISCELLANEOUS) ×1
BAG SPEC THK2 15X12 ZIP CLS (MISCELLANEOUS) ×1
BAG ZIPLOCK 12X15 (MISCELLANEOUS) ×2 IMPLANT
BANDAGE ESMARK 6X9 LF (GAUZE/BANDAGES/DRESSINGS) ×1 IMPLANT
BNDG CMPR 9X6 STRL LF SNTH (GAUZE/BANDAGES/DRESSINGS) ×1
BNDG COHESIVE 4X5 TAN STRL (GAUZE/BANDAGES/DRESSINGS) ×2 IMPLANT
BNDG CONFORM 2 STRL LF (GAUZE/BANDAGES/DRESSINGS) ×2 IMPLANT
BNDG ELASTIC 4X5.8 VLCR STR LF (GAUZE/BANDAGES/DRESSINGS) ×1 IMPLANT
BNDG ESMARK 6X9 LF (GAUZE/BANDAGES/DRESSINGS) ×2
BNDG GAUZE ELAST 4 BULKY (GAUZE/BANDAGES/DRESSINGS) ×1 IMPLANT
CHLORAPREP W/TINT 26 (MISCELLANEOUS) ×2 IMPLANT
COVER SURGICAL LIGHT HANDLE (MISCELLANEOUS) ×2 IMPLANT
CUFF TOURN SGL QUICK 18X4 (TOURNIQUET CUFF) ×2 IMPLANT
DRSG PAD ABDOMINAL 8X10 ST (GAUZE/BANDAGES/DRESSINGS) ×2 IMPLANT
GAUZE SPONGE 4X4 12PLY STRL (GAUZE/BANDAGES/DRESSINGS) ×2 IMPLANT
GAUZE XEROFORM 1X8 LF (GAUZE/BANDAGES/DRESSINGS) ×2 IMPLANT
GLOVE SRG 8 PF TXTR STRL LF DI (GLOVE) ×1 IMPLANT
GLOVE SURG LTX SZ8 (GLOVE) ×2 IMPLANT
GLOVE SURG UNDER POLY LF SZ8 (GLOVE) ×2
GOWN STRL REUS W/TWL LRG LVL3 (GOWN DISPOSABLE) ×2 IMPLANT
KIT BASIN OR (CUSTOM PROCEDURE TRAY) ×2 IMPLANT
KIT TURNOVER KIT A (KITS) ×2 IMPLANT
NDL HYPO 25X1 1.5 SAFETY (NEEDLE) ×1 IMPLANT
NEEDLE HYPO 25X1 1.5 SAFETY (NEEDLE) ×2 IMPLANT
NS IRRIG 1000ML POUR BTL (IV SOLUTION) ×2 IMPLANT
PACK ORTHO EXTREMITY (CUSTOM PROCEDURE TRAY) ×2 IMPLANT
PENCIL SMOKE EVACUATOR (MISCELLANEOUS) IMPLANT
PROTECTOR NERVE ULNAR (MISCELLANEOUS) ×2 IMPLANT
SUT ETHILON 4 0 PS 2 18 (SUTURE) ×4 IMPLANT
TOWEL OR 17X26 10 PK STRL BLUE (TOWEL DISPOSABLE) ×2 IMPLANT

## 2020-11-22 NOTE — Anesthesia Procedure Notes (Signed)
Anesthesia Regional Block: Popliteal block   Pre-Anesthetic Checklist: ,, timeout performed, Correct Patient, Correct Site, Correct Laterality, Correct Procedure, Correct Position, site marked, Risks and benefits discussed,  Surgical consent,  Pre-op evaluation,  At surgeon's request and post-op pain management  Laterality: Left  Prep: Dura Prep       Needles:  Injection technique: Single-shot  Needle Type: Echogenic Stimulator Needle     Needle Length: 10cm  Needle Gauge: 20     Additional Needles:   Procedures:,,,, ultrasound used (permanent image in chart),,,,  Narrative:  Start time: 11/22/2020 2:25 PM End time: 11/22/2020 2:31 PM Injection made incrementally with aspirations every 5 mL.  Performed by: Personally  Anesthesiologist: Darral Dash, DO  Additional Notes: Patient identified. Risks/Benefits/Options discussed with patient including but not limited to bleeding, infection, nerve damage, failed block, incomplete pain control. Patient expressed understanding and wished to proceed. All questions were answered. Sterile technique was used throughout the entire procedure. Please see nursing notes for vital signs. Aspirated in 5cc intervals with injection for negative confirmation. Patient was given instructions on fall risk and not to get out of bed. All questions and concerns addressed with instructions to call with any issues or inadequate analgesia.

## 2020-11-22 NOTE — Progress Notes (Signed)
Assisted Dr. Greg Stoltzfus with left, ultrasound guided, popliteal block. Side rails up, monitors on throughout procedure. See vital signs in flow sheet. Tolerated Procedure well. 

## 2020-11-22 NOTE — Transfer of Care (Signed)
Immediate Anesthesia Transfer of Care Note  Patient: [name]  Procedure(s) Performed: AMPUTATION TOE MPJ JOINT LEFT FOOT (Left Toe)  Patient Location: PACU  Anesthesia Type:MAC  Level of Consciousness: awake, alert  and oriented  Airway & Oxygen Therapy: Patient Spontanous Breathing and Patient connected to face mask oxygen  Post-op Assessment: Report given to RN and Post -op Vital signs reviewed and stable  Post vital signs: Reviewed and stable  Last Vitals:  Vitals Value Taken Time  BP    Temp    Pulse 68 11/22/20 1535  Resp 16 11/22/20 1535  SpO2 99 % 11/22/20 1535  Vitals shown include unvalidated device data.  Last Pain:  Vitals:   11/22/20 1341  TempSrc:   PainSc: 0-No pain         Complications: No complications documented.

## 2020-11-22 NOTE — Anesthesia Preprocedure Evaluation (Addendum)
Anesthesia Evaluation  Patient identified by MRN, date of birth, ID band Patient awake    Reviewed: Allergy & Precautions, NPO status , Patient's Chart, lab work & pertinent test results  Airway Mallampati: II  TM Distance: >3 FB Neck ROM: Full    Dental  (+) Teeth Intact   Pulmonary neg pulmonary ROS,    Pulmonary exam normal        Cardiovascular hypertension, Pt. on medications + CAD, + Peripheral Vascular Disease and +CHF  + dysrhythmias Atrial Fibrillation + Cardiac Defibrillator  Rhythm:Irregular Rate:Normal     Neuro/Psych negative neurological ROS  negative psych ROS   GI/Hepatic negative GI ROS, Neg liver ROS,   Endo/Other  diabetes, Type 2, Oral Hypoglycemic Agents  Renal/GU    Prostate Ca    Musculoskeletal Osteomyelitis left foot   Abdominal (+)  Abdomen: soft. Bowel sounds: normal.  Peds  Hematology negative hematology ROS (+)   Anesthesia Other Findings   Reproductive/Obstetrics                            Anesthesia Physical Anesthesia Plan  ASA: III  Anesthesia Plan: MAC and Regional   Post-op Pain Management:  Regional for Post-op pain   Induction: Intravenous  PONV Risk Score and Plan: 1 and Ondansetron, Treatment may vary due to age or medical condition and Midazolam  Airway Management Planned: Simple Face Mask, Natural Airway and Nasal Cannula  Additional Equipment: None  Intra-op Plan:   Post-operative Plan:   Informed Consent: I have reviewed the patients History and Physical, chart, labs and discussed the procedure including the risks, benefits and alternatives for the proposed anesthesia with the patient or authorized representative who has indicated his/her understanding and acceptance.     Dental advisory given  Plan Discussed with: Anesthesiologist  Anesthesia Plan Comments: (Lab Results      Component                Value                Date                      WBC                      11.8 (H)            11/06/2020                HGB                      13.1                11/06/2020                HCT                      40.3                11/06/2020                MCV                      103.9 (H)           11/06/2020                PLT  389                 11/06/2020           Lab Results      Component                Value               Date                      NA                       137                 11/19/2020                K                        4.7                 11/19/2020                CO2                      27                  11/19/2020                GLUCOSE                  162 (H)             11/19/2020                BUN                      26 (H)              11/19/2020                CREATININE               1.20 (H)            11/19/2020                CALCIUM                  9.4                 11/19/2020                GFRNONAA                 58 (L)              11/19/2020                GFRAA                    67                  11/19/2020           ECHO 12/21: Left ventricular ejection fraction, by estimation, is 35 to 40%. The left ventricle has normal function. The left ventricle demonstrates global hypokinesis. There is mild left ventricular hypertrophy. Left ventricular diastolic parameters are indeterminate. 2. Right ventricular systolic function is normal. The right ventricular size is normal. There  is normal pulmonary artery systolic pressure. 3. Left atrial size was severely dilated. 4. Right atrial size was severely dilated. 5. The mitral valve is normal in structure. Mild mitral valve regurgitation. No evidence of mitral stenosis. 6. The aortic valve was not well visualized. There is severe calcifcation of the aortic valve. There is severe thickening of the aortic valve. Aortic valve regurgitation is not visualized. No aortic stenosis is  present. 7. The inferior vena cava is normal in size with greater than 50% respiratory variability, suggesting right atrial pressure of 3 mmHg.)       Anesthesia Quick Evaluation

## 2020-11-22 NOTE — Anesthesia Postprocedure Evaluation (Signed)
Anesthesia Post Note  Patient: [name]  Procedure(s) Performed: AMPUTATION TOE MPJ JOINT LEFT FOOT (Left Toe)     Patient location during evaluation: PACU Anesthesia Type: Regional and MAC Level of consciousness: awake and alert Pain management: pain level controlled Vital Signs Assessment: post-procedure vital signs reviewed and stable Respiratory status: spontaneous breathing, nonlabored ventilation, respiratory function stable and patient connected to nasal cannula oxygen Cardiovascular status: stable and blood pressure returned to baseline Postop Assessment: no apparent nausea or vomiting Anesthetic complications: no   No complications documented.  Last Vitals:  Vitals:   11/22/20 1600 11/22/20 1651  BP: 111/71 126/79  Pulse: 70   Resp: 15 16  Temp: 36.7 C 36.4 C  SpO2: 100%     Last Pain:  Vitals:   11/22/20 1651  TempSrc: Oral  PainSc:                  March Rummage Stoltzfus

## 2020-11-22 NOTE — H&P (Signed)
Anesthesia H&P Update: History and Physical Exam reviewed; patient is OK for planned anesthetic and procedure. ? ?

## 2020-11-22 NOTE — Progress Notes (Signed)
Orthopedic Tech Progress Note Patient Details:  [name] 07/09/1941 [id number]  Ortho Devices Type of Ortho Device: CAM walker Ortho Device/Splint Location: verbal order boot to be put on in or Ortho Device/Splint Interventions: Criss Alvine 11/22/2020, 3:16 PM

## 2020-11-22 NOTE — Op Note (Signed)
   OPERATIVE REPORT Patient name: [name] MRN: [id number] DOB: Feb 05, 1941  DOS:  11/22/2020  Preop Dx: Osteomyelitis left great toe Postop Dx: same  Procedure:  1. Amputation left great toe  Surgeon: Edrick Kins DPM  Anesthesia: regional block with IV sedation  Hemostasis: None  EBL: 50 mL Materials: none Injectables: none Pathology: none  Condition: The patient tolerated the procedure and anesthesia well. No complications noted or reported   Justification for procedure: The patient is a 80 y.o. male who presents today for surgical correction of osteomyelitis to the left great toe. All conservative modalities of been unsuccessful in providing any sort of satisfactory alleviation of symptoms with the patient. The patient was told benefits as well as possible side effects of the surgery. The patient consented for surgical correction. The patient consent form was reviewed. All patient questions were answered. No guarantees were expressed or implied. The patient and the surgeon boson the patient consent form with the witness present and placed in the patient's chart.   Procedure in Detail: The patient was brought to the operating room, placed in the operating table in the supine position at which time an aseptic scrub and drape were performed about the patient's respective lower extremity after anesthesia was induced as described above. Attention was then directed to the surgical area where procedure number one commenced.  Procedure #1: Left great toe amputation A fishmouth type incision was planned and made overlying the MTPJ of the left great toe.  Incision was carried down to the level of bone and joint with care taken to cut clamp ligate and retract away all small neurovascular structures traversing the incision site.  The hallux was grasped with a perforating towel clamp and distracted distally and the toe was disarticulated at the level of the MTPJ.  The toe was removed  in toto.  The extensor and flexor tendons were distracted as far distal as possible and cut as far proximal as could be visualized.  Irrigation was utilized in preparation for primary closure.  4-0 Prolene suture was utilized to reapproximate superficial skin edges for primary closure of the amputation site  Dry sterile compressive dressings were then applied to all previously mentioned incision sites about the patient's lower extremity. The patient was then transferred from the operating room to the recovery room having tolerated the procedure and anesthesia well. All vital signs are stable. After a brief stay in the recovery room the patient was discharged with adequate prescriptions for analgesia. Verbal as well as written instructions were provided for the patient regarding wound care. The patient is to keep the dressings clean dry and intact until they are to follow surgeon Dr. Daylene Katayama in the office upon discharge.   Edrick Kins, DPM [facility]  Dr. Edrick Kins, DPM    [address], [place] [address]                Office [phone]  Fax [phone]

## 2020-11-22 NOTE — Interval H&P Note (Signed)
History and Physical Interval Note:  11/22/2020 1:52 PM  [name]  has presented today for surgery, with the diagnosis of OSTEOMYOLYTIS LEFT FOOT.  The various methods of treatment have been discussed with the patient and family. After consideration of risks, benefits and other options for treatment, the patient has consented to  Procedure(s): AMPUTATION TOE MPJ JOINT LEFT FOOT (Left) as a surgical intervention.  The patient's history has been reviewed, patient examined, no change in status, stable for surgery.  I have reviewed the patient's chart and labs.  Questions were answered to the patient's satisfaction.     Edrick Kins

## 2020-11-22 NOTE — Brief Op Note (Signed)
11/22/2020  3:39 PM  PATIENT:  [name]  80 y.o. male  PRE-OPERATIVE DIAGNOSIS:  OSTEOMYOLYTIS LEFT FOOT  POST-OPERATIVE DIAGNOSIS:  OSTEOMYOLYTIS LEFT FOOT  PROCEDURE:  Procedure(s): AMPUTATION TOE MPJ JOINT LEFT FOOT (Left)  SURGEON:  Surgeon(s) and Role:    Edrick Kins, DPM - Primary  PHYSICIAN ASSISTANT:   ASSISTANTS: none   ANESTHESIA:   IV sedation with regional block  EBL:  50 mL   BLOOD ADMINISTERED:none  DRAINS: none   LOCAL MEDICATIONS USED:  XYLOCAINE   SPECIMEN:  No Specimen  DISPOSITION OF SPECIMEN:  N/A  COUNTS:  YES  TOURNIQUET:  * Missing tourniquet times found for documented tourniquets in log: [id number] *  DICTATION: .Dragon Dictation  PLAN OF CARE: Discharge to home after PACU  PATIENT DISPOSITION:  PACU - hemodynamically stable.   Delay start of Pharmacological VTE agent (>24hrs) due to surgical blood loss or risk of bleeding: not applicable

## 2020-11-22 NOTE — Anesthesia Procedure Notes (Signed)
Date/Time: 11/22/2020 2:58 PM Performed by: Sharlette Dense, CRNA Oxygen Delivery Method: Simple face mask

## 2020-11-23 ENCOUNTER — Encounter (HOSPITAL_COMMUNITY): Payer: Self-pay | Admitting: Podiatry

## 2020-11-25 LAB — SURGICAL PATHOLOGY

## 2020-11-27 ENCOUNTER — Encounter: Payer: Medicare HMO | Admitting: Podiatry

## 2020-11-27 ENCOUNTER — Ambulatory Visit (INDEPENDENT_AMBULATORY_CARE_PROVIDER_SITE_OTHER): Payer: Medicare HMO

## 2020-11-27 ENCOUNTER — Ambulatory Visit (INDEPENDENT_AMBULATORY_CARE_PROVIDER_SITE_OTHER): Payer: Medicare HMO | Admitting: Podiatry

## 2020-11-27 ENCOUNTER — Other Ambulatory Visit: Payer: Self-pay

## 2020-11-27 DIAGNOSIS — Z9889 Other specified postprocedural states: Secondary | ICD-10-CM

## 2020-11-27 NOTE — Progress Notes (Signed)
   Subjective:  Patient presents today status post left great toe amputation. DOS: 11/22/2020.  Patient states he is doing well.  No pain.  He is kept the dressings clean dry and intact.  No new complaints at this time  Past Medical History:  Diagnosis Date  . AICD (automatic cardioverter/defibrillator) present   . Cancer ([place])   . CHF (congestive heart failure) ([place])    a. EF 45% in 2016 b. EF at 20-25% by repeat echo in 08/2019 with cath in 09/2019 showing nonobstructive CAD  . Chronic atrial fibrillation ([place])   . Chronic bronchitis   . Diabetes mellitus, type II ([place])    no insulin  . Gout   . History of herpes zoster virus   . History of radiation therapy 12/21/12- 02/15/13   prostate 78 gray in 40 fx  . Hyperlipidemia   . Hypertension   . Peripheral vascular disease ([place])   . Prostate cancer ([place]) 2014   EBRT + hormonal therapy  . Vitamin D deficiency disease 06/07/2019      Objective/Physical Exam Neurovascular status intact.  Skin incisions appear to be well coapted with sutures intact. No sign of infectious process noted. No dehiscence. No active bleeding noted. Moderate edema noted to the surgical extremity.  Radiographic Exam:  Absence of the left great toe at the level of the MTPJ  Assessment: 1. s/p left great toe amputation. DOS: 11/22/2020   Plan of Care:  1. Patient was evaluated. X-rays reviewed 2.  Dressings changed today.  Keep clean dry and intact x1 week Three.  Postsurgical shoe dispensed.  Discontinue cam boot Four.  Return to clinic in 1 week   Edrick Kins, DPM [facility]  Dr. Edrick Kins, DPM    [address], [place] [address]                Office [phone]  Fax [phone]

## 2020-11-27 NOTE — Progress Notes (Signed)
EPIC Encounter for ICM Monitoring  Patient Name: [name] is a 80 y.o. male Date: 11/27/2020 Primary Care Physican: Doree Albee, MD Primary Cardiologist:[place] Electrophysiologist:Taylor Bi-V Pacing:97% 11/06/2020 OfficeWeight:172lbs    Transmission reviewed.  Optivol thoracic impedance normal.  Prescribed:  Furosemide20 mg take 1 tablet by mouth daily Spironolactone 25 mg take 1 tablet daily  Labs: 11/22/2020 Creatinine 1.15, BUN 23, Potassium 4.9, Sodium 134 11/19/2020 Creatinine 1.20, BUN 26, Potassium 4.7, Sodium 137, GFR 58-67  11/11/2020 Creatinine 1.63, BUN 45, Potassium 5.5, Sodium 133, GFR 40-46  11/07/2019 Creatinine 2.02, BUN 42, Potassium 5.7, Sodium 135 A complete set of results can be found in Results Review.  Recommendations: No changes.  Follow-up plan: ICM clinic phone appointment on4/12/2020. 91 day device clinic remote transmission3/11/2020.   EP/Cardiology Office Visits:02/06/2021 with Dr Domenic Polite.   Copy of ICM check sent to Dr.Taylor.   3 month ICM trend: 11/18/2020.    1 Year ICM trend:       Rosalene Billings, RN 11/27/2020 4:52 PM

## 2020-11-28 ENCOUNTER — Ambulatory Visit (INDEPENDENT_AMBULATORY_CARE_PROVIDER_SITE_OTHER): Payer: Medicare HMO

## 2020-11-28 DIAGNOSIS — I5042 Chronic combined systolic (congestive) and diastolic (congestive) heart failure: Secondary | ICD-10-CM | POA: Diagnosis not present

## 2020-11-29 LAB — CUP PACEART REMOTE DEVICE CHECK
Battery Remaining Longevity: 52 mo
Battery Voltage: 2.98 V
Brady Statistic AP VP Percent: 0 %
Brady Statistic AP VS Percent: 0 %
Brady Statistic AS VP Percent: 0 %
Brady Statistic AS VS Percent: 0 %
Brady Statistic RA Percent Paced: 0 %
Brady Statistic RV Percent Paced: 96.89 %
Date Time Interrogation Session: 20220303063426
HighPow Impedance: 84 Ohm
Implantable Lead Implant Date: 20210603
Implantable Lead Implant Date: 20210603
Implantable Lead Location: 753858
Implantable Lead Location: 753860
Implantable Lead Model: 4598
Implantable Pulse Generator Implant Date: 20210603
Lead Channel Impedance Value: 191.854
Lead Channel Impedance Value: 191.854
Lead Channel Impedance Value: 216.367
Lead Channel Impedance Value: 250.87 Ohm
Lead Channel Impedance Value: 250.87 Ohm
Lead Channel Impedance Value: 323 Ohm
Lead Channel Impedance Value: 342 Ohm
Lead Channel Impedance Value: 380 Ohm
Lead Channel Impedance Value: 4047 Ohm
Lead Channel Impedance Value: 437 Ohm
Lead Channel Impedance Value: 437 Ohm
Lead Channel Impedance Value: 570 Ohm
Lead Channel Impedance Value: 589 Ohm
Lead Channel Impedance Value: 646 Ohm
Lead Channel Impedance Value: 665 Ohm
Lead Channel Impedance Value: 855 Ohm
Lead Channel Impedance Value: 893 Ohm
Lead Channel Impedance Value: 912 Ohm
Lead Channel Pacing Threshold Amplitude: 0.625 V
Lead Channel Pacing Threshold Amplitude: 2 V
Lead Channel Pacing Threshold Pulse Width: 0.4 ms
Lead Channel Pacing Threshold Pulse Width: 0.8 ms
Lead Channel Sensing Intrinsic Amplitude: 7.5 mV
Lead Channel Sensing Intrinsic Amplitude: 7.5 mV
Lead Channel Setting Pacing Amplitude: 2.5 V
Lead Channel Setting Pacing Amplitude: 3 V
Lead Channel Setting Pacing Pulse Width: 0.4 ms
Lead Channel Setting Pacing Pulse Width: 0.8 ms
Lead Channel Setting Sensing Sensitivity: 0.3 mV

## 2020-12-02 ENCOUNTER — Ambulatory Visit (INDEPENDENT_AMBULATORY_CARE_PROVIDER_SITE_OTHER): Payer: Medicare HMO | Admitting: *Deleted

## 2020-12-02 ENCOUNTER — Other Ambulatory Visit: Payer: Self-pay

## 2020-12-02 DIAGNOSIS — I4821 Permanent atrial fibrillation: Secondary | ICD-10-CM

## 2020-12-02 DIAGNOSIS — Z5181 Encounter for therapeutic drug level monitoring: Secondary | ICD-10-CM | POA: Diagnosis not present

## 2020-12-02 LAB — POCT INR: INR: 1.3 — AB (ref 2.0–3.0)

## 2020-12-02 NOTE — Patient Instructions (Signed)
S/P amputation Lt great toe on 11/13/20 Wife states he has been taking warfarin 1 tablet (5mg ) daily since surgery Take warfarin 1 1/2 tablets tonight and tomorrow night then continue 1 tablet daily Recheck in 1 week

## 2020-12-04 ENCOUNTER — Ambulatory Visit (INDEPENDENT_AMBULATORY_CARE_PROVIDER_SITE_OTHER): Payer: Medicare HMO | Admitting: Podiatry

## 2020-12-04 ENCOUNTER — Other Ambulatory Visit: Payer: Self-pay

## 2020-12-04 DIAGNOSIS — M79675 Pain in left toe(s): Secondary | ICD-10-CM | POA: Diagnosis not present

## 2020-12-04 DIAGNOSIS — B351 Tinea unguium: Secondary | ICD-10-CM

## 2020-12-04 DIAGNOSIS — M79674 Pain in right toe(s): Secondary | ICD-10-CM

## 2020-12-04 DIAGNOSIS — L97512 Non-pressure chronic ulcer of other part of right foot with fat layer exposed: Secondary | ICD-10-CM | POA: Diagnosis not present

## 2020-12-04 DIAGNOSIS — Z9889 Other specified postprocedural states: Secondary | ICD-10-CM

## 2020-12-04 DIAGNOSIS — E0843 Diabetes mellitus due to underlying condition with diabetic autonomic (poly)neuropathy: Secondary | ICD-10-CM

## 2020-12-05 ENCOUNTER — Other Ambulatory Visit (INDEPENDENT_AMBULATORY_CARE_PROVIDER_SITE_OTHER): Payer: Self-pay | Admitting: Internal Medicine

## 2020-12-09 ENCOUNTER — Ambulatory Visit (INDEPENDENT_AMBULATORY_CARE_PROVIDER_SITE_OTHER): Payer: Medicare HMO | Admitting: *Deleted

## 2020-12-09 ENCOUNTER — Other Ambulatory Visit: Payer: Self-pay

## 2020-12-09 DIAGNOSIS — I4821 Permanent atrial fibrillation: Secondary | ICD-10-CM | POA: Diagnosis not present

## 2020-12-09 DIAGNOSIS — Z5181 Encounter for therapeutic drug level monitoring: Secondary | ICD-10-CM

## 2020-12-09 LAB — POCT INR: INR: 1.3 — AB (ref 2.0–3.0)

## 2020-12-09 NOTE — Patient Instructions (Signed)
S/P amputation Lt great toe on 11/22/20 Take warfarin 2 tablets tonight, 1 1/2 tablets tomorrow night then increase dose to 1 tablet daily except 1 1/2 tablets on Sundays and Wednesdays Recheck in 1 week

## 2020-12-10 NOTE — Progress Notes (Signed)
Remote ICD transmission.   

## 2020-12-11 ENCOUNTER — Other Ambulatory Visit: Payer: Self-pay

## 2020-12-11 ENCOUNTER — Encounter (INDEPENDENT_AMBULATORY_CARE_PROVIDER_SITE_OTHER): Payer: Self-pay | Admitting: Internal Medicine

## 2020-12-11 ENCOUNTER — Ambulatory Visit (INDEPENDENT_AMBULATORY_CARE_PROVIDER_SITE_OTHER): Payer: Medicare HMO | Admitting: Internal Medicine

## 2020-12-11 ENCOUNTER — Encounter: Payer: Medicare HMO | Admitting: Podiatry

## 2020-12-11 VITALS — BP 108/66 | HR 91 | Temp 97.7°F | Ht 74.5 in | Wt 186.8 lb

## 2020-12-11 DIAGNOSIS — I5042 Chronic combined systolic (congestive) and diastolic (congestive) heart failure: Secondary | ICD-10-CM | POA: Diagnosis not present

## 2020-12-11 DIAGNOSIS — I10 Essential (primary) hypertension: Secondary | ICD-10-CM

## 2020-12-11 LAB — COMPLETE METABOLIC PANEL WITH GFR
AG Ratio: 1.3 (calc) (ref 1.0–2.5)
ALT: 20 U/L (ref 9–46)
AST: 20 U/L (ref 10–35)
Albumin: 3.9 g/dL (ref 3.6–5.1)
Alkaline phosphatase (APISO): 79 U/L (ref 35–144)
BUN: 24 mg/dL (ref 7–25)
CO2: 25 mmol/L (ref 20–32)
Calcium: 9.4 mg/dL (ref 8.6–10.3)
Chloride: 106 mmol/L (ref 98–110)
Creat: 1.03 mg/dL (ref 0.70–1.18)
GFR, Est African American: 80 mL/min/{1.73_m2} (ref >=60)
GFR, Est Non African American: 69 mL/min/{1.73_m2} (ref >=60)
Globulin: 2.9 g/dL (calc) (ref 1.9–3.7)
Glucose, Bld: 230 mg/dL — ABNORMAL HIGH (ref 65–139)
Potassium: 4.4 mmol/L (ref 3.5–5.3)
Sodium: 140 mmol/L (ref 135–146)
Total Bilirubin: 0.5 mg/dL (ref 0.2–1.2)
Total Protein: 6.8 g/dL (ref 6.1–8.1)

## 2020-12-11 NOTE — Progress Notes (Signed)
Metrics: Intervention Frequency ACO  Documented Smoking Status Yearly  Screened one or more times in 24 months  Cessation Counseling or  Active cessation medication Past 24 months  Past 24 months   Guideline developer: UpToDate (See UpToDate for funding source) Date Released: 2014       Wellness Office Visit  Subjective:  Patient ID: [name], male    DOB: [birth year]  Age: 80 y.o. MRN: [id number]  CC: This man comes in for follow-up after left  toe amputation which he underwent approximately 3 weeks ago now. HPI  I have been concerned regarding his potassium levels in the setting of congestive heart failure.  We had decided to discontinue spironolactone temporarily.  He continues to take furosemide 20 mg daily.  He has done well.  He has not gained any significant weight.  He denies any dyspnea.  His energy levels for him seem to be good. Past Medical History:  Diagnosis Date  . AICD (automatic cardioverter/defibrillator) present   . Cancer ([place])   . CHF (congestive heart failure) ([place])    a. EF 45% in 2016 b. EF at 20-25% by repeat echo in 08/2019 with cath in 09/2019 showing nonobstructive CAD  . Chronic atrial fibrillation ([place])   . Chronic bronchitis   . Diabetes mellitus, type II ([place])    no insulin  . Gout   . History of herpes zoster virus   . History of radiation therapy 12/21/12- 02/15/13   prostate 78 gray in 40 fx  . Hyperlipidemia   . Hypertension   . Peripheral vascular disease ([place])   . Prostate cancer ([place]) 2014   EBRT + hormonal therapy  . Vitamin D deficiency disease 06/07/2019   Past Surgical History:  Procedure Laterality Date  . AMPUTATION Bilateral 12/18/2019   Procedure: BILATERAL 2ND TOE AMPUTATION DIGIT;  Surgeon: Edrick Kins, DPM;  Location: [facility];  Service: Podiatry;  Laterality: Bilateral;  . AMPUTATION TOE Left 11/22/2020   Procedure: AMPUTATION TOE MPJ JOINT LEFT FOOT;  Surgeon: Edrick Kins, DPM;  Location: [facility];  Service: Podiatry;   Laterality: Left;  . BIV ICD INSERTION CRT-D N/A 02/29/2020   Procedure: BIV ICD INSERTION CRT-D;  Surgeon: Evans Lance, MD;  Location: [facility];  Service: Cardiovascular;  Laterality: N/A;  . CATARACT EXTRACTION, BILATERAL    . KNEE SURGERY     left knee  . PROSTATE BIOPSY    . RIGHT/LEFT HEART CATH AND CORONARY ANGIOGRAPHY N/A 10/04/2019   Procedure: RIGHT/LEFT HEART CATH AND CORONARY ANGIOGRAPHY;  Surgeon: Belva Crome, MD;  Location: [facility];  Service: Cardiovascular;  Laterality: N/A;     Family History  Problem Relation Age of Onset  . Kidney failure Mother   . Heart attack Father     Social History   Social History Narrative  . Not on file   Social History   Tobacco Use  . Smoking status: Never Smoker  . Smokeless tobacco: Never Used  Substance Use Topics  . Alcohol use: No    Alcohol/week: 0.0 standard drinks    Current Meds  Medication Sig  . acetaminophen (TYLENOL) 500 MG tablet Take 500 mg by mouth in the morning and at bedtime.  Marland Kitchen allopurinol (ZYLOPRIM) 300 MG tablet Take 2 tablets (600 mg total) by mouth daily. (Patient taking differently: Take 300 mg by mouth 2 (two) times daily.)  . Alpha-Lipoic Acid 200 MG CAPS Take 200 mg by mouth at bedtime.  Marland Kitchen  Ascorbic Acid 500 MG CAPS Take 500 mg by mouth in the morning and at bedtime.  . Blood Glucose Monitoring Suppl (TRUE METRIX METER) w/Device KIT USE AS DIRECTED  . cetaphil (CETAPHIL) lotion Apply 1 application topically as needed for dry skin (leg).  . Cholecalciferol (VITAMIN D-3) 125 MCG (5000 UT) TABS Take 5,000 Units by mouth every evening.  . digoxin (LANOXIN) 0.125 MG tablet Take 1 tablet (0.125 mg total) by mouth daily. (Patient taking differently: Take 0.125 mg by mouth every evening.)  . furosemide (LASIX) 20 MG tablet Take 1 tablet (20 mg total) by mouth daily.  Marland Kitchen glipiZIDE (GLUCOTROL) 5 MG tablet TAKE 1 TABLET EVERY DAY  . lovastatin (MEVACOR) 40 MG tablet TAKE 1 TABLET AT  BEDTIME (Patient taking differently: Take 40 mg by mouth at bedtime.)  . MAGNESIUM CHLORIDE-CALCIUM PO Take 3 tablets by mouth daily. MagneCal  . oxyCODONE-acetaminophen (PERCOCET) 5-325 MG tablet Take 1 tablet by mouth every 6 (six) hours as needed for severe pain.  . sacubitril-valsartan (ENTRESTO) 24-26 MG Take 1 tablet by mouth 2 (two) times daily.  . TRUE METRIX BLOOD GLUCOSE TEST test strip TEST BLOOD SUGAR EVERY DAY  . TRUEplus Lancets 33G MISC TEST BLOOD SUGAR EVERY DAY  . vitamin E 400 UNIT capsule Take 400 Units by mouth every evening.  . warfarin (COUMADIN) 5 MG tablet TAKE 1/2 TABLET ON SATURDAYS AND TAKE 1 TABLET ALL OTHER DAYS  . Zinc 50 MG TABS Take 25 mg by mouth at bedtime.       Objective:   Today's Vitals: BP 108/66   Pulse 91   Temp 97.7 F (36.5 C) (Temporal)   Ht 6' 2.5" (1.892 m)   Wt 186 lb 12.8 oz (84.7 kg)   SpO2 97%   BMI 23.66 kg/m  Vitals with BMI 12/11/2020 11/22/2020 11/22/2020  Height 6' 2.5" - -  Weight 186 lbs 13 oz - -  BMI 48.01 - -  Systolic 655 374 -  Diastolic 66 79 -  Pulse 91 70 74     Physical Exam   He looks systemically well.  His weight has been stable since last time I saw him.  Lung fields are clear.  He is not clinically in decompensated heart failure.    Assessment   1. Essential hypertension   2. Chronic combined systolic and diastolic heart failure (HCC)       Tests ordered Orders Placed This Encounter  Procedures  . COMPLETE METABOLIC PANEL WITH GFR     Plan: 1. He will continue with all medications and we will check renal function and potassium.  If his potassium is in a reasonable range, I may ask him to restart the spironolactone. 2. Follow-up with Judson Roch in about 2 months.   No orders of the defined types were placed in this encounter.   Doree Albee, MD

## 2020-12-12 NOTE — Progress Notes (Signed)
Please call the patient/patient's wife and let them know that the kidney function is very good now so he should start taking the spironolactone again that he was previously taking.  Thanks.

## 2020-12-16 ENCOUNTER — Ambulatory Visit (INDEPENDENT_AMBULATORY_CARE_PROVIDER_SITE_OTHER): Payer: Medicare HMO | Admitting: *Deleted

## 2020-12-16 DIAGNOSIS — I4821 Permanent atrial fibrillation: Secondary | ICD-10-CM | POA: Diagnosis not present

## 2020-12-16 DIAGNOSIS — Z5181 Encounter for therapeutic drug level monitoring: Secondary | ICD-10-CM | POA: Diagnosis not present

## 2020-12-16 LAB — POCT INR: INR: 2 (ref 2.0–3.0)

## 2020-12-16 NOTE — Patient Instructions (Signed)
S/P amputation Lt great toe on 11/22/20 Continue warfarin 1 tablet daily except 1 1/2 tablets on Sundays and Wednesdays Recheck in 2 weeks

## 2020-12-16 NOTE — Progress Notes (Signed)
   Subjective:  Patient presents today status post left great toe amputation. DOS: 11/22/2020.  Patient states he is doing well.  No pain.  He is kept the dressings clean dry and intact.   Today the patient is requesting a nail trim.  He states that his nails are tender and elongated.  Patient also states that he has a new complaint regarding a wound to the right great toe.  He is unsure how long the wound has been present for.  He presents for further treatment evaluation  Past Medical History:  Diagnosis Date  . AICD (automatic cardioverter/defibrillator) present   . Cancer ([place])   . CHF (congestive heart failure) ([place])    a. EF 45% in 2016 b. EF at 20-25% by repeat echo in 08/2019 with cath in 09/2019 showing nonobstructive CAD  . Chronic atrial fibrillation ([place])   . Chronic bronchitis   . Diabetes mellitus, type II ([place])    no insulin  . Gout   . History of herpes zoster virus   . History of radiation therapy 12/21/12- 02/15/13   prostate 78 gray in 40 fx  . Hyperlipidemia   . Hypertension   . Peripheral vascular disease ([place])   . Prostate cancer ([place]) 2014   EBRT + hormonal therapy  . Vitamin D deficiency disease 06/07/2019   Objective: Physical Exam General: The patient is alert and oriented x3 in no acute distress.  Dermatology: Skin is cool, dry and supple bilateral lower extremities.  Ulcer noted to the right great toe measuring approximately 0.3 x 0.3 x 0.2 cm.  To the noted ulceration there is no eschar.  There is a moderate amount of slough fibrin and necrotic tissue noted.  Wound base and granulation tissue is red.  There is no exposed bone muscle tendon ligament or joint.  Hyperkeratotic discolored elongated nails noted 1-5 bilateral with sensitivity to palpation  Vascular: Palpable pedal pulses bilaterally. No edema or erythema noted. Capillary refill within normal limits.  Neurological: Epicritic and protective threshold absent bilaterally.   Musculoskeletal Exam:  All pedal and ankle joints range of motion within normal limits bilateral. Muscle strength 5/5 in all groups bilateral.    Assessment: 1. s/p left great toe amputation. DOS: 11/22/2020 2.  Pain due to onychomycosis of toenail both 3.  Diabetes mellitus neuropathy unspecified 4.  Ulcer right great toe fat layer exposed 5.  H/0 right second toe amputation   Plan of Care:  1. Patient was evaluated.  2.  Mechanical debridement of nails 1-5 bilateral was performed using a nail nipper without incident or bleeding 3.  Medically necessary excisional debridement including subcutaneous tissue was performed to the right hallux ulcer using a tissue nipper.  Excisional debridement of all necrotic nonviable tissue down to healthy bleeding viable tissue was performed with post debridement measurement same as pre- 4.  Return to clinic in 2 weeks for suture removal   Edrick Kins, DPM [facility]  Dr. Edrick Kins, DPM    [address], [place] [address]                Office [phone]  Fax [phone]

## 2020-12-18 ENCOUNTER — Ambulatory Visit (INDEPENDENT_AMBULATORY_CARE_PROVIDER_SITE_OTHER): Payer: Medicare HMO | Admitting: Podiatry

## 2020-12-18 ENCOUNTER — Other Ambulatory Visit: Payer: Self-pay

## 2020-12-18 DIAGNOSIS — Z9889 Other specified postprocedural states: Secondary | ICD-10-CM

## 2020-12-18 DIAGNOSIS — L97512 Non-pressure chronic ulcer of other part of right foot with fat layer exposed: Secondary | ICD-10-CM

## 2020-12-18 DIAGNOSIS — E0843 Diabetes mellitus due to underlying condition with diabetic autonomic (poly)neuropathy: Secondary | ICD-10-CM

## 2020-12-18 NOTE — Progress Notes (Signed)
   Subjective:  Patient presents today status post left great toe amputation. DOS: 11/22/2020.  Overall the patient states he is doing well.  No pain.  No new complaints at this time  Past Medical History:  Diagnosis Date  . AICD (automatic cardioverter/defibrillator) present   . Cancer ([place])   . CHF (congestive heart failure) ([place])    a. EF 45% in 2016 b. EF at 20-25% by repeat echo in 08/2019 with cath in 09/2019 showing nonobstructive CAD  . Chronic atrial fibrillation ([place])   . Chronic bronchitis   . Diabetes mellitus, type II ([place])    no insulin  . Gout   . History of herpes zoster virus   . History of radiation therapy 12/21/12- 02/15/13   prostate 78 gray in 40 fx  . Hyperlipidemia   . Hypertension   . Peripheral vascular disease ([place])   . Prostate cancer ([place]) 2014   EBRT + hormonal therapy  . Vitamin D deficiency disease 06/07/2019   Objective: Physical Exam General: The patient is alert and oriented x3 in no acute distress.  Dermatology: Skin is cool, dry and supple bilateral lower extremities.  Ulcer noted to the right great toe measuring approximately 0.3 x 0.3 x 0.2 cm.  To the noted ulceration there is no eschar.  There is a moderate amount of slough fibrin and necrotic tissue noted.  Wound base and granulation tissue is red.  There is no exposed bone muscle tendon ligament or joint.  Hyperkeratotic discolored elongated nails noted 1-5 bilateral with sensitivity to palpation  Vascular: Palpable pedal pulses bilaterally. No edema or erythema noted. Capillary refill within normal limits.  Neurological: Epicritic and protective threshold absent bilaterally.   Musculoskeletal Exam: All pedal and ankle joints range of motion within normal limits bilateral. Muscle strength 5/5 in all groups bilateral.    Assessment: 1. s/p left great toe amputation. DOS: 11/22/2020 2. H/0 right second toe amputation 3.  Ulcer right great toe fat layer exposed   Plan of Care:  1.  Patient was evaluated.  2.  Sutures removed today.  Skin incisions are well coapted and healed 3.  Recommend good supportive shoes and sneakers.  Patient may discontinue postsurgical shoe 4.  Continue antibiotic ointment and a light Band-Aid to the right great toe ulcer  5.  Return to clinic in 3 months for routine foot care   Edrick Kins, DPM [facility]  Dr. Edrick Kins, DPM    [address], [place] [address]                Office [phone]  Fax [phone]

## 2020-12-30 ENCOUNTER — Other Ambulatory Visit: Payer: Self-pay

## 2020-12-30 ENCOUNTER — Ambulatory Visit (INDEPENDENT_AMBULATORY_CARE_PROVIDER_SITE_OTHER): Payer: Medicare HMO | Admitting: *Deleted

## 2020-12-30 ENCOUNTER — Ambulatory Visit (INDEPENDENT_AMBULATORY_CARE_PROVIDER_SITE_OTHER): Payer: Medicare HMO

## 2020-12-30 DIAGNOSIS — I4821 Permanent atrial fibrillation: Secondary | ICD-10-CM | POA: Diagnosis not present

## 2020-12-30 DIAGNOSIS — Z95810 Presence of automatic (implantable) cardiac defibrillator: Secondary | ICD-10-CM | POA: Diagnosis not present

## 2020-12-30 DIAGNOSIS — I5042 Chronic combined systolic (congestive) and diastolic (congestive) heart failure: Secondary | ICD-10-CM

## 2020-12-30 DIAGNOSIS — Z5181 Encounter for therapeutic drug level monitoring: Secondary | ICD-10-CM | POA: Diagnosis not present

## 2020-12-30 LAB — POCT INR: INR: 3.2 — AB (ref 2.0–3.0)

## 2020-12-30 NOTE — Patient Instructions (Signed)
S/P amputation Lt great toe on 11/22/20 Hold warfarin tonight then resume 1 tablet daily except 1 1/2 tablets on Sundays and Wednesdays Recheck in 3 weeks

## 2021-01-01 NOTE — Progress Notes (Signed)
EPIC Encounter for ICM Monitoring  Patient Name: [name] is a 80 y.o. male Date: 01/01/2021 Primary Care Physican: Ailene Ards, NP Primary Cardiologist:McDowell Electrophysiologist:Taylor Bi-V Pacing:95.6% 11/06/2020 OfficeWeight:172lbs    Spoke with wife. She reports patient is doing well.  He had surgery on his foot at the end of February but is recovering.   Optivol thoracic impedance normal.  Prescribed:  Furosemide20 mg take 1 tablet by mouth daily Spironolactone 25 mg take 1 tablet daily  Labs: 11/22/2020 Creatinine 1.15, BUN 23, Potassium 4.9, Sodium 134 11/19/2020 Creatinine 1.20, BUN 26, Potassium 4.7, Sodium 137, GFR 58-67  11/11/2020 Creatinine 1.63, BUN 45, Potassium 5.5, Sodium 133, GFR 40-46  11/07/2019 Creatinine 2.02, BUN 42, Potassium 5.7, Sodium 135 A complete set of results can be found in Results Review.  Recommendations: No changes and encouraged to call if experiencing any fluid symptoms.  Follow-up plan: ICM clinic phone appointment on5/05/2021. 91 day device clinic remote transmission6/10/2020.   EP/Cardiology Office Visits: 02/28/2021 with Dr Domenic Polite.   Copy of ICM check sent to Dr.Taylor.   3 month ICM trend: 12/30/2020.    1 Year ICM trend:       Rosalene Billings, RN 01/01/2021 12:25 PM

## 2021-01-20 ENCOUNTER — Other Ambulatory Visit (INDEPENDENT_AMBULATORY_CARE_PROVIDER_SITE_OTHER): Payer: Self-pay | Admitting: Internal Medicine

## 2021-01-20 ENCOUNTER — Ambulatory Visit (INDEPENDENT_AMBULATORY_CARE_PROVIDER_SITE_OTHER): Payer: Medicare HMO | Admitting: *Deleted

## 2021-01-20 DIAGNOSIS — I4821 Permanent atrial fibrillation: Secondary | ICD-10-CM

## 2021-01-20 DIAGNOSIS — E782 Mixed hyperlipidemia: Secondary | ICD-10-CM

## 2021-01-20 DIAGNOSIS — Z5181 Encounter for therapeutic drug level monitoring: Secondary | ICD-10-CM | POA: Diagnosis not present

## 2021-01-20 DIAGNOSIS — E119 Type 2 diabetes mellitus without complications: Secondary | ICD-10-CM

## 2021-01-20 LAB — POCT INR: INR: 2.3 (ref 2.0–3.0)

## 2021-01-20 NOTE — Patient Instructions (Signed)
S/P amputation Lt great toe on 11/22/20 Continue warfarin 1 tablet daily except 1 1/2 tablets on Sundays and Wednesdays Recheck in 4 weeks

## 2021-02-03 ENCOUNTER — Ambulatory Visit (INDEPENDENT_AMBULATORY_CARE_PROVIDER_SITE_OTHER): Payer: Medicare HMO

## 2021-02-03 DIAGNOSIS — I5042 Chronic combined systolic (congestive) and diastolic (congestive) heart failure: Secondary | ICD-10-CM

## 2021-02-03 DIAGNOSIS — Z95810 Presence of automatic (implantable) cardiac defibrillator: Secondary | ICD-10-CM

## 2021-02-03 NOTE — Progress Notes (Signed)
EPIC Encounter for ICM Monitoring  Patient Name: [name] is a 80 y.o. male Date: 02/03/2021 Primary Care Physican: Ailene Ards, NP Primary Cardiologist:McDowell Electrophysiologist:Taylor Bi-V Pacing:95.6% 02/04/2021 Weight:185lbs    Spoke with wife and reports patient is feeling well at this time.  Denies fluid symptoms.     Optivol thoracic impedance normal.  Prescribed:  Furosemide20 mg take 1 tablet by mouth daily Spironolactone 25 mg take 1 tablet daily  Labs: 11/22/2020 Creatinine1.15, BUN23, Potassium4.9, Sodium134 11/19/2020 Creatinine1.20, BUN26, Potassium4.7, [id number], LPF79-02  11/11/2020 Creatinine1.63, BUN45, Potassium5.5, Sodium133, GFR40-46  11/07/2019 Creatinine2.02, BUN42, Potassium5.7, Sodium135 A complete set of results can be found in Results Review.  Recommendations: No changes and encouraged to call if experiencing any fluid symptoms.  Follow-up plan: ICM clinic phone appointment on6/20/2022. 91 day device clinic remote transmission6/10/2020.   EP/Cardiology Office Visits: 02/28/2021 with Dr Domenic Polite.   Copy of ICM check sent to Dr.Taylor.  3 month ICM trend: 02/03/2021.    1 Year ICM trend:       Rosalene Billings, RN 02/03/2021 4:51 PM

## 2021-02-05 DIAGNOSIS — H40033 Anatomical narrow angle, bilateral: Secondary | ICD-10-CM | POA: Diagnosis not present

## 2021-02-05 DIAGNOSIS — G44219 Episodic tension-type headache, not intractable: Secondary | ICD-10-CM | POA: Diagnosis not present

## 2021-02-06 ENCOUNTER — Ambulatory Visit: Payer: Medicare HMO | Admitting: Cardiology

## 2021-02-10 ENCOUNTER — Telehealth: Payer: Self-pay | Admitting: Internal Medicine

## 2021-02-10 ENCOUNTER — Telehealth (INDEPENDENT_AMBULATORY_CARE_PROVIDER_SITE_OTHER): Payer: Self-pay

## 2021-02-10 NOTE — Telephone Encounter (Signed)
Attempted phone call to pt.  Left voicemail message to contact triage RN at [phone].  

## 2021-02-10 NOTE — Telephone Encounter (Signed)
Pt wife called; not feel well. Concerns of  Pacer/Dfib device concerns? To R/o sick feeling today. Pt has been to out of town weekend and just came back . Did normal routine to senior ctr for meals, etc today. But not best today. Senior ctr taken vitals: B/p: 122/78 & Hr=75. Recommend that she takes him to the urgent care to have him evaluated now; to be on safe side. She said she could see his heart beat in his shirt & flesh when laying on the bed. Wife agree & patient they will head over to Urgent care on [address] Darlin Coco

## 2021-02-10 NOTE — Telephone Encounter (Signed)
-----   Message from Alfonzo Beers sent at 02/10/2021  9:59 AM EDT ----- Hoyle Sauer called and left a voicemail that she wants you to call her back about something that is going on with [place] and she needs to talk to you because it is scaring her.

## 2021-02-10 NOTE — Telephone Encounter (Signed)
Spoke with wife , patient gave verbal permission to speak to his wife. He was no home at time of the call to send remote transmission. Patient and wife report that he felt " funny" this morning. He reports no CP, chest pressure, dizziness, SOB or syncope. Wife reports he " just does not feel right." She reports he feels his heart " beating hard through his whole body."   Lost connection to patient and wife. Reestablished call and informed them to send a manual transmission when they return home. Device clinic phone number provided to assist in sending manual transmission. Lost connection for second time and when called patient back got voice mail and left device clinic # to return call.

## 2021-02-10 NOTE — Telephone Encounter (Signed)
Spoke with patient's wife.  They are at the [place] right now, she will try to locate patient and bring him home so they can send a transmission.  Gave general instructions and advised her to call back if further assistance is needed.

## 2021-02-10 NOTE — Telephone Encounter (Signed)
I agree with this plan.

## 2021-02-10 NOTE — Telephone Encounter (Signed)
    Pt c/o BP issue: STAT if pt c/o blurred vision, one-sided weakness or slurred speech  1. What are your last 5 BP readings? 122/78 HR 75  2. Are you having any other symptoms (ex. Dizziness, headache, blurred vision, passed out)? Pt said "he doesn't feel right"  3. What is your BP issue? Pt's wife said she is calling because she can see the pt's heart beating hard through the pt's shirt. She is concerned, the home health visited pt today and his BP was checked, both BP and HR are normal. She wanted to get pt's defib check if there's something wrong with his heart

## 2021-02-11 ENCOUNTER — Telehealth: Payer: Self-pay | Admitting: Cardiology

## 2021-02-11 NOTE — Telephone Encounter (Signed)
Call returned to wife - reassured that heart rate of 75 is normal.  She states that she can see his heart beating throughout his whole body.  Remote device check was WNL.  Will keep his appointment as scheduled for tomorrow with Dr. Lovena Le for further evaluation.

## 2021-02-11 NOTE — Telephone Encounter (Signed)
New message    Patient feels like heart is racing - you can see it pounding iin his chest , hr is 75

## 2021-02-11 NOTE — Telephone Encounter (Signed)
Patient's wife calling back. She states she sent a transmission yesterday around 4:10-4:30pm and wants to know if it was received and if anything was found on the results.

## 2021-02-11 NOTE — Telephone Encounter (Signed)
Patient contacted and wife on speaker phone. Patient transmission reviewed from 02/10/21. Device function WNL and no episodes or treatment by device  at time of episode the night of 02/09/21. Patient had no CP, chest tightness , SOB dizziness or syncope. Patient and wife reassured. Due for yearly follow-up in September. ED precautions provided for CP, SOB, chest pressure , or syncope.

## 2021-02-12 ENCOUNTER — Encounter: Payer: Self-pay | Admitting: Internal Medicine

## 2021-02-12 ENCOUNTER — Ambulatory Visit (INDEPENDENT_AMBULATORY_CARE_PROVIDER_SITE_OTHER): Payer: Medicare HMO | Admitting: Nurse Practitioner

## 2021-02-12 ENCOUNTER — Ambulatory Visit: Payer: Medicare HMO | Admitting: Internal Medicine

## 2021-02-12 ENCOUNTER — Other Ambulatory Visit: Payer: Self-pay

## 2021-02-12 VITALS — BP 114/62 | HR 93 | Ht 74.5 in | Wt 184.6 lb

## 2021-02-12 DIAGNOSIS — I4891 Unspecified atrial fibrillation: Secondary | ICD-10-CM

## 2021-02-12 DIAGNOSIS — I5042 Chronic combined systolic (congestive) and diastolic (congestive) heart failure: Secondary | ICD-10-CM | POA: Diagnosis not present

## 2021-02-12 DIAGNOSIS — I2510 Atherosclerotic heart disease of native coronary artery without angina pectoris: Secondary | ICD-10-CM

## 2021-02-12 DIAGNOSIS — I4821 Permanent atrial fibrillation: Secondary | ICD-10-CM

## 2021-02-12 NOTE — Progress Notes (Signed)
HPI [name] returns today for followup. He is a pleasant 80 yo man with a h/o chronic systolic heart failure, chronic atrial fib, s/p biv ICD insertion. He underwent lead revision 10 months ago. He has developed diaghragmatic stim. He denies chest pain. No sob. He has undergone removal of his big toe.  Allergies  Allergen Reactions  . Clarithromycin Other (See Comments)    "aches & pains all over", no appetite, sleepy  . Fluoride Preparations Other (See Comments)    unknown  . Toprol Xl [Metoprolol Tartrate]     Worsening Dyspnea with this in 08/2018     Current Outpatient Medications  Medication Sig Dispense Refill  . acetaminophen (TYLENOL) 500 MG tablet Take 500 mg by mouth in the morning and at bedtime.    Marland Kitchen allopurinol (ZYLOPRIM) 300 MG tablet Take 1 tablet (300 mg total) by mouth daily. 90 tablet 1  . Alpha-Lipoic Acid 200 MG CAPS Take 200 mg by mouth at bedtime.    . Ascorbic Acid 500 MG CAPS Take 500 mg by mouth in the morning and at bedtime.    . Blood Glucose Monitoring Suppl (TRUE METRIX METER) w/Device KIT USE AS DIRECTED 1 kit 0  . carvedilol (COREG) 25 MG tablet Take 1.5 tablets (37.5 mg total) by mouth 2 (two) times daily. 270 tablet 3  . cetaphil (CETAPHIL) lotion Apply 1 application topically as needed for dry skin (leg).    . Cholecalciferol (VITAMIN D-3) 125 MCG (5000 UT) TABS Take 5,000 Units by mouth every evening.    . digoxin (LANOXIN) 0.125 MG tablet Take 1 tablet (0.125 mg total) by mouth daily. (Patient taking differently: Take 0.125 mg by mouth every evening.) 90 tablet 3  . furosemide (LASIX) 20 MG tablet Take 1 tablet (20 mg total) by mouth daily. 90 tablet 3  . glipiZIDE (GLUCOTROL) 5 MG tablet TAKE 1 TABLET EVERY DAY 90 tablet 0  . sacubitril-valsartan (ENTRESTO) 24-26 MG Take 1 tablet by mouth 2 (two) times daily. 60 tablet 6  . spironolactone (ALDACTONE) 25 MG tablet Take 1 tablet (25 mg total) by mouth daily. 90 tablet 3  . TRUE METRIX BLOOD  GLUCOSE TEST test strip TEST BLOOD SUGAR EVERY DAY 100 strip 3  . TRUEplus Lancets 33G MISC TEST BLOOD SUGAR EVERY DAY 100 each 3  . vitamin E 400 UNIT capsule Take 400 Units by mouth every evening.    . warfarin (COUMADIN) 5 MG tablet TAKE 1/2 TABLET ON SATURDAYS AND TAKE 1 TABLET ALL OTHER DAYS 85 tablet 1  . Zinc 50 MG TABS Take 25 mg by mouth at bedtime.    . lovastatin (MEVACOR) 40 MG tablet TAKE 1 TABLET AT BEDTIME (Patient not taking: Reported on 02/12/2021) 90 tablet 1  . MAGNESIUM CHLORIDE-CALCIUM PO Take 3 tablets by mouth daily. MagneCal (Patient not taking: Reported on 02/12/2021)    . oxyCODONE-acetaminophen (PERCOCET) 5-325 MG tablet Take 1 tablet by mouth every 6 (six) hours as needed for severe pain. (Patient not taking: Reported on 02/12/2021) 20 tablet 0   No current facility-administered medications for this visit.     Past Medical History:  Diagnosis Date  . AICD (automatic cardioverter/defibrillator) present   . Cancer ([place])   . CHF (congestive heart failure) ([place])    a. EF 45% in 2016 b. EF at 20-25% by repeat echo in 08/2019 with cath in 09/2019 showing nonobstructive CAD  . Chronic atrial fibrillation ([place])   . Chronic bronchitis   .  Diabetes mellitus, type II ([place])    no insulin  . Gout   . History of herpes zoster virus   . History of radiation therapy 12/21/12- 02/15/13   prostate 78 gray in 40 fx  . Hyperlipidemia   . Hypertension   . Peripheral vascular disease ([place])   . Prostate cancer ([place]) 2014   EBRT + hormonal therapy  . Vitamin D deficiency disease 06/07/2019    ROS:   All systems reviewed and negative except as noted in the HPI.   Past Surgical History:  Procedure Laterality Date  . AMPUTATION Bilateral 12/18/2019   Procedure: BILATERAL 2ND TOE AMPUTATION DIGIT;  Surgeon: Edrick Kins, DPM;  Location: [facility];  Service: Podiatry;  Laterality: Bilateral;  . AMPUTATION TOE Left 11/22/2020   Procedure: AMPUTATION TOE MPJ JOINT LEFT FOOT;  Surgeon:  Edrick Kins, DPM;  Location: [facility];  Service: Podiatry;  Laterality: Left;  . BIV ICD INSERTION CRT-D N/A 02/29/2020   Procedure: BIV ICD INSERTION CRT-D;  Surgeon: Evans Lance, MD;  Location: [facility];  Service: Cardiovascular;  Laterality: N/A;  . CATARACT EXTRACTION, BILATERAL    . KNEE SURGERY     left knee  . PROSTATE BIOPSY    . RIGHT/LEFT HEART CATH AND CORONARY ANGIOGRAPHY N/A 10/04/2019   Procedure: RIGHT/LEFT HEART CATH AND CORONARY ANGIOGRAPHY;  Surgeon: Belva Crome, MD;  Location: [facility];  Service: Cardiovascular;  Laterality: N/A;     Family History  Problem Relation Age of Onset  . Kidney failure Mother   . Heart attack Father      Social History   Socioeconomic History  . Marital status: Married    Spouse name: Not on file  . Number of children: Not on file  . Years of education: Not on file  . Highest education level: Not on file  Occupational History  . Not on file  Tobacco Use  . Smoking status: Never Smoker  . Smokeless tobacco: Never Used  Vaping Use  . Vaping Use: Never used  Substance and Sexual Activity  . Alcohol use: No    Alcohol/week: 0.0 standard drinks  . Drug use: No  . Sexual activity: Not on file  Other Topics Concern  . Not on file  Social History Narrative  . Not on file   Social Determinants of Health   Financial Resource Strain: Not on file  Food Insecurity: Not on file  Transportation Needs: Not on file  Physical Activity: Not on file  Stress: Not on file  Social Connections: Not on file  Intimate Partner Violence: Not on file     BP 114/62   Pulse 93   Ht 6' 2.5" (1.892 m)   Wt 184 lb 9.6 oz (83.7 kg)   SpO2 96%   BMI 23.38 kg/m   Physical Exam:  Well appearing NAD HEENT: Unremarkable Neck:  No JVD, no thyromegally Lymphatics:  No adenopathy Back:  No CVA tenderness Lungs:  Clear with no wheezes HEART:  IRegular rate rhythm, no murmurs, no rubs, no clicks Abd:  soft, positive  bowel sounds, no organomegally, no rebound, no guarding; thumping Ext:  2 plus pulses, no edema, no cyanosis, no clubbing Skin:  No rashes no nodules Neuro:  CN II through XII intact, motor grossly intact   DEVICE  Normal device function.  See PaceArt for details.   Assess/Plan: 1. Diaghragmatic stim - we have reprogrammed his LV lead to 3-coil and no additional  stim. He will call us if it comes back.  2. Chronic systolic heart failure - his symptoms are class 2. He will continue his current meds. 3. Atrial fib - his VR is well controlled. We will follow. 4. ICD - his medtronic biv is working normally. We have reprogrammed and lowered his LV output.  5. Coags - he admits to some periods where he eats too much vit K in the form of greens and we discussed that he either eat none at all or a small amount daily.  Carleene Overlie Atleigh Gruen,MD

## 2021-02-12 NOTE — Patient Instructions (Signed)
Medication Instructions:  Your physician recommends that you continue on your current medications as directed. Please refer to the Current Medication list given to you today.  *If you need a refill on your cardiac medications before your next appointment, please call your pharmacy*   Lab Work: None ordered   Testing/Procedures: None ordered   Follow-Up: At [facility], you and your health needs are our priority.  As part of our continuing mission to provide you with exceptional heart care, we have created designated Provider Care Teams.  These Care Teams include your primary Cardiologist (physician) and Advanced Practice Providers (APPs -  Physician Assistants and Nurse Practitioners) who all work together to provide you with the care you need, when you need it.  Remote monitoring is used to monitor your Pacemaker or ICD from home. This monitoring reduces the number of office visits required to check your device to one time per year. It allows [place] to keep an eye on the functioning of your device to ensure it is working properly. You are scheduled for a device check from home on 02/27/2021. You may send your transmission at any time that day. If you have a wireless device, the transmission will be sent automatically. After your physician reviews your transmission, you will receive a postcard with your next transmission date.  Your next appointment:   1 year(s)  The format for your next appointment:   In Person  Provider:   Cristopher Peru, MD   Thank you for choosing [facility]!!

## 2021-02-17 ENCOUNTER — Encounter (INDEPENDENT_AMBULATORY_CARE_PROVIDER_SITE_OTHER): Payer: Self-pay | Admitting: Nurse Practitioner

## 2021-02-17 ENCOUNTER — Other Ambulatory Visit: Payer: Self-pay | Admitting: Student

## 2021-02-17 ENCOUNTER — Ambulatory Visit (INDEPENDENT_AMBULATORY_CARE_PROVIDER_SITE_OTHER): Payer: Medicare HMO | Admitting: *Deleted

## 2021-02-17 ENCOUNTER — Other Ambulatory Visit: Payer: Self-pay

## 2021-02-17 ENCOUNTER — Ambulatory Visit (INDEPENDENT_AMBULATORY_CARE_PROVIDER_SITE_OTHER): Payer: Medicare HMO | Admitting: Nurse Practitioner

## 2021-02-17 ENCOUNTER — Encounter: Payer: Medicare HMO | Admitting: Podiatry

## 2021-02-17 VITALS — BP 102/68 | HR 62 | Temp 97.3°F | Ht 68.5 in | Wt 180.4 lb

## 2021-02-17 DIAGNOSIS — E559 Vitamin D deficiency, unspecified: Secondary | ICD-10-CM

## 2021-02-17 DIAGNOSIS — Z5181 Encounter for therapeutic drug level monitoring: Secondary | ICD-10-CM | POA: Diagnosis not present

## 2021-02-17 DIAGNOSIS — R413 Other amnesia: Secondary | ICD-10-CM | POA: Diagnosis not present

## 2021-02-17 DIAGNOSIS — E782 Mixed hyperlipidemia: Secondary | ICD-10-CM | POA: Diagnosis not present

## 2021-02-17 DIAGNOSIS — E119 Type 2 diabetes mellitus without complications: Secondary | ICD-10-CM

## 2021-02-17 DIAGNOSIS — I4821 Permanent atrial fibrillation: Secondary | ICD-10-CM

## 2021-02-17 DIAGNOSIS — I10 Essential (primary) hypertension: Secondary | ICD-10-CM

## 2021-02-17 LAB — POCT INR: INR: 2.3 (ref 2.0–3.0)

## 2021-02-17 MED ORDER — WARFARIN SODIUM 5 MG PO TABS: ORAL_TABLET | ORAL | 1 refills | Status: AC

## 2021-02-17 NOTE — Progress Notes (Signed)
Subjective:  Patient ID: [name], male    DOB: [birth year]  Age: 80 y.o. MRN: [id number]  CC:  Chief Complaint  Patient presents with  . Hyperlipidemia  . Diabetes  . Other    Memory concerns, vitamin D deficiency      HPI  This patient arrives today for the above.  Hyperlipidemia: Patient is no longer taking his lovastatin.  Per his wife lovastatin was stopped in March 2022.  She is concerned that is affecting his memory.  Last LDL was 98 this was collected back in December 2021 at which point he was on his lovastatin.  They are wondering if the patient can take anything other than lovastatin to treat hyperlipidemia.  Diabetes: Last A1c was collected about 3 months ago and it was 7.4.  He continues on glipizide in the morning.  He also is on an ARB.  He has been positive for albuminuria.  Vitamin D deficiency: He continues on 5000 IUs of vitamin D3 daily.  Last serum check showed a level of 39.  Memory concerns: The patient's wife reports that he is having increased difficulty with memory.  She tells me he is forgetting doctors appointments and names of people.  She would like this to be evaluated and treated if possible.  Past Medical History:  Diagnosis Date  . AICD (automatic cardioverter/defibrillator) present   . Cancer ([place])   . CHF (congestive heart failure) ([place])    a. EF 45% in 2016 b. EF at 20-25% by repeat echo in 08/2019 with cath in 09/2019 showing nonobstructive CAD  . Chronic atrial fibrillation ([place])   . Chronic bronchitis   . Diabetes mellitus, type II ([place])    no insulin  . Gout   . History of herpes zoster virus   . History of radiation therapy 12/21/12- 02/15/13   prostate 78 Soren Pigman in 40 fx  . Hyperlipidemia   . Hypertension   . Peripheral vascular disease ([place])   . Prostate cancer ([place]) 2014   EBRT + hormonal therapy  . Vitamin D deficiency disease 06/07/2019      Family History  Problem Relation Age of Onset  . Kidney failure Mother    . Heart attack Father     Social History   Social History Narrative  . Not on file   Social History   Tobacco Use  . Smoking status: Never Smoker  . Smokeless tobacco: Never Used  Substance Use Topics  . Alcohol use: No    Alcohol/week: 0.0 standard drinks     Current Meds  Medication Sig  . acetaminophen (TYLENOL) 500 MG tablet Take 500 mg by mouth in the morning and at bedtime.  Marland Kitchen allopurinol (ZYLOPRIM) 300 MG tablet Take 1 tablet (300 mg total) by mouth daily.  . Alpha-Lipoic Acid 200 MG CAPS Take 200 mg by mouth at bedtime.  . Ascorbic Acid 500 MG CAPS Take 500 mg by mouth in the morning and at bedtime.  . Cholecalciferol (VITAMIN D-3) 125 MCG (5000 UT) TABS Take 5,000 Units by mouth every evening.  . digoxin (LANOXIN) 0.125 MG tablet Take 1 tablet (0.125 mg total) by mouth daily. (Patient taking differently: Take 0.125 mg by mouth every evening.)  . furosemide (LASIX) 20 MG tablet Take 1 tablet (20 mg total) by mouth daily.  Marland Kitchen glipiZIDE (GLUCOTROL) 5 MG tablet TAKE 1 TABLET EVERY DAY  . oxyCODONE-acetaminophen (PERCOCET) 5-325 MG tablet Take 1 tablet by mouth every 6 (six) hours as  needed for severe pain.  . sacubitril-valsartan (ENTRESTO) 24-26 MG Take 1 tablet by mouth 2 (two) times daily.  Marland Kitchen warfarin (COUMADIN) 5 MG tablet TAKE 1/2 TABLET ON SATURDAYS AND TAKE 1 TABLET ALL OTHER DAYS  . Zinc 50 MG TABS Take 25 mg by mouth at bedtime.    ROS:  Review of Systems  Respiratory: Negative for shortness of breath.   Cardiovascular: Negative for chest pain and leg swelling.  Neurological: Negative for dizziness and headaches.  Psychiatric/Behavioral: Positive for memory loss.     Objective:   Today's Vitals: BP 102/68   Pulse 62   Temp (!) 97.3 F (36.3 C)   Ht 5' 8.5" (1.74 m)   Wt 180 lb 6.4 oz (81.8 kg)   SpO2 97%   BMI 27.03 kg/m  Vitals with BMI 02/17/2021 02/12/2021 12/11/2020  Height 5' 8.5" 6' 2.5" 6' 2.5"  Weight 180 lbs 6 oz 184 lbs 10 oz 186 lbs 13  oz  BMI 27.03 37.16 96.78  Systolic 938 101 751  Diastolic 68 62 66  Pulse 62 93 91     Physical Exam Vitals reviewed.  Constitutional:      Appearance: Normal appearance.  HENT:     Head: Normocephalic and atraumatic.  Cardiovascular:     Rate and Rhythm: Normal rate and regular rhythm.  Pulmonary:     Effort: Pulmonary effort is normal.     Breath sounds: Normal breath sounds.  Musculoskeletal:     Cervical back: Neck supple.  Skin:    General: Skin is warm and dry.  Neurological:     Mental Status: He is alert and oriented to person, place, and time.  Psychiatric:        Mood and Affect: Mood normal.        Behavior: Behavior normal.        Thought Content: Thought content normal.        Judgment: Judgment normal.       6CIT Screen 02/17/2021  What Year? 0 points  What month? 0 points  What time? 0 points  Count back from 20 0 points  Months in reverse 4 points  Repeat phrase 10 points  Total Score 14       Assessment and Plan   1. Memory loss   2. Essential hypertension   3. Type 2 diabetes mellitus without complication, without long-term current use of insulin ([place])   4. Vitamin D deficiency disease   5. Mixed hyperlipidemia      Plan: 1.,  5.  We will check blood work for further evaluation today.  If LDL is markedly elevated may consider restarting him on lovastatin or starting him on Zetia.  We will also check B12 and if this level is low we will start him on a B12 supplement, if normal will refer him to neurology for further evaluation of his memory loss.  As seen above his 6 sit score is elevated. 2.  He will continue on his medications as prescribed. 3.  He will continue on his medications as prescribed and we will check A1c today. 4.  We will check serum vitamin D level today he will continue on his supplement as currently prescribed.   Tests ordered Orders Placed This Encounter  Procedures  . Lipid Panel  . Vitamin B12  . CMP with  eGFR([facility])  . Hemoglobin A1c  . Vitamin D, 25-hydroxy      No orders of the defined types were placed in this  encounter.   Patient to follow-up in 2 months or sooner as needed.  Ailene Ards, NP

## 2021-02-17 NOTE — Patient Instructions (Signed)
S/P amputation Lt great toe on 11/22/20 Continue warfarin 1 tablet daily except 1 1/2 tablets on Sundays and Wednesdays Recheck in 4 weeks 

## 2021-02-18 ENCOUNTER — Other Ambulatory Visit (INDEPENDENT_AMBULATORY_CARE_PROVIDER_SITE_OTHER): Payer: Self-pay | Admitting: Nurse Practitioner

## 2021-02-18 DIAGNOSIS — R413 Other amnesia: Secondary | ICD-10-CM

## 2021-02-18 LAB — COMPLETE METABOLIC PANEL WITH GFR
AG Ratio: 1.2 (calc) (ref 1.0–2.5)
ALT: 25 U/L (ref 9–46)
AST: 21 U/L (ref 10–35)
Albumin: 4.1 g/dL (ref 3.6–5.1)
Alkaline phosphatase (APISO): 76 U/L (ref 35–144)
BUN/Creatinine Ratio: 23 (calc) — ABNORMAL HIGH (ref 6–22)
BUN: 36 mg/dL — ABNORMAL HIGH (ref 7–25)
CO2: 26 mmol/L (ref 20–32)
Calcium: 9.6 mg/dL (ref 8.6–10.3)
Chloride: 101 mmol/L (ref 98–110)
Creat: 1.59 mg/dL — ABNORMAL HIGH (ref 0.70–1.18)
GFR, Est African American: 47 mL/min/{1.73_m2} — ABNORMAL LOW (ref >=60)
GFR, Est Non African American: 41 mL/min/{1.73_m2} — ABNORMAL LOW (ref >=60)
Globulin: 3.3 g/dL (calc) (ref 1.9–3.7)
Glucose, Bld: 236 mg/dL — ABNORMAL HIGH (ref 65–139)
Potassium: 5.3 mmol/L (ref 3.5–5.3)
Sodium: 137 mmol/L (ref 135–146)
Total Bilirubin: 0.6 mg/dL (ref 0.2–1.2)
Total Protein: 7.4 g/dL (ref 6.1–8.1)

## 2021-02-18 LAB — VITAMIN B12: Vitamin B-12: 534 pg/mL (ref 200–1100)

## 2021-02-18 LAB — LIPID PANEL
Cholesterol: 267 mg/dL — ABNORMAL HIGH (ref <200)
HDL: 34 mg/dL — ABNORMAL LOW (ref >=40)
LDL Cholesterol (Calc): 194 mg/dL (calc) — ABNORMAL HIGH
Non-HDL Cholesterol (Calc): 233 mg/dL (calc) — ABNORMAL HIGH (ref <130)
Total CHOL/HDL Ratio: 7.9 (calc) — ABNORMAL HIGH (ref <5.0)
Triglycerides: 209 mg/dL — ABNORMAL HIGH (ref <150)

## 2021-02-18 LAB — HEMOGLOBIN A1C
Hgb A1c MFr Bld: 7.3 % of total Hgb — ABNORMAL HIGH (ref <5.7)
Mean Plasma Glucose: 163 mg/dL
eAG (mmol/L): 9 mmol/L

## 2021-02-18 LAB — VITAMIN D 25 HYDROXY (VIT D DEFICIENCY, FRACTURES): Vit D, 25-Hydroxy: 39 ng/mL (ref 30–100)

## 2021-02-18 NOTE — Telephone Encounter (Signed)
This is a [place] pt, Dr. McDowell 

## 2021-02-19 ENCOUNTER — Ambulatory Visit (INDEPENDENT_AMBULATORY_CARE_PROVIDER_SITE_OTHER): Payer: Medicare HMO | Admitting: Nurse Practitioner

## 2021-02-20 ENCOUNTER — Encounter (INDEPENDENT_AMBULATORY_CARE_PROVIDER_SITE_OTHER): Payer: Self-pay | Admitting: Nurse Practitioner

## 2021-02-20 NOTE — Progress Notes (Signed)
Aug 22 @ 3pm for Neurologist in Dr Krista Blue. But he is not able to get him in to see him. But she did put him on call list on Mon & Weds. She will continue to keep a eye out on him until then. Next appt she will see if you want to do anything special far as meals to eat to better his bad cholesterol? Ask if you would send her a mychart message of some instructions and recommendations.

## 2021-02-24 IMAGING — DX DG CHEST 2V
2 series · 2 of 2 positions shown · non-contrast
Comparison: Chest radiograph dated 02/29/2020.

CLINICAL DATA: 78-year-old male with ICD placement.

EXAM:
CHEST - 2 VIEW

[dg chest 2 view (1 of 2)]
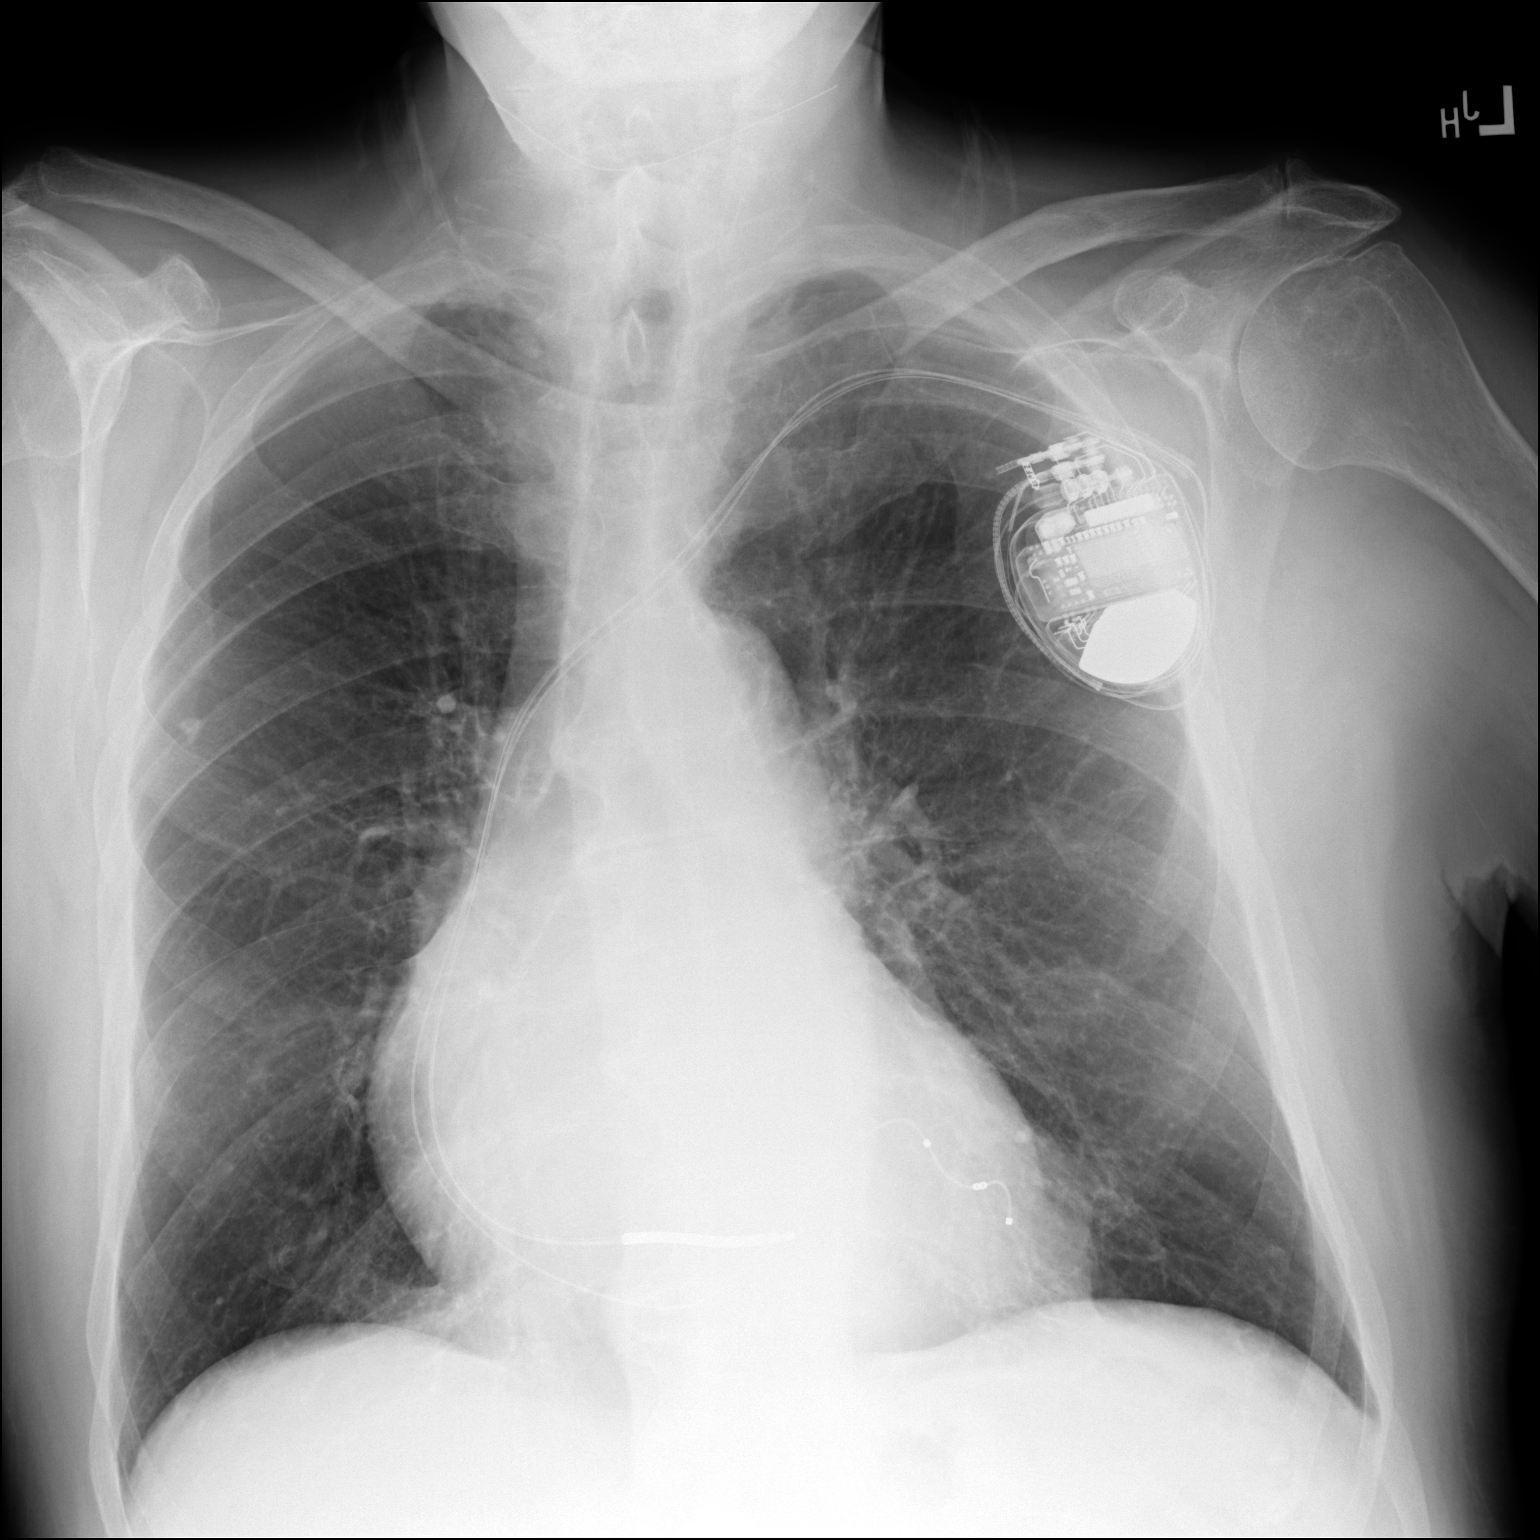

[dg chest 2 view (2 of 2)]
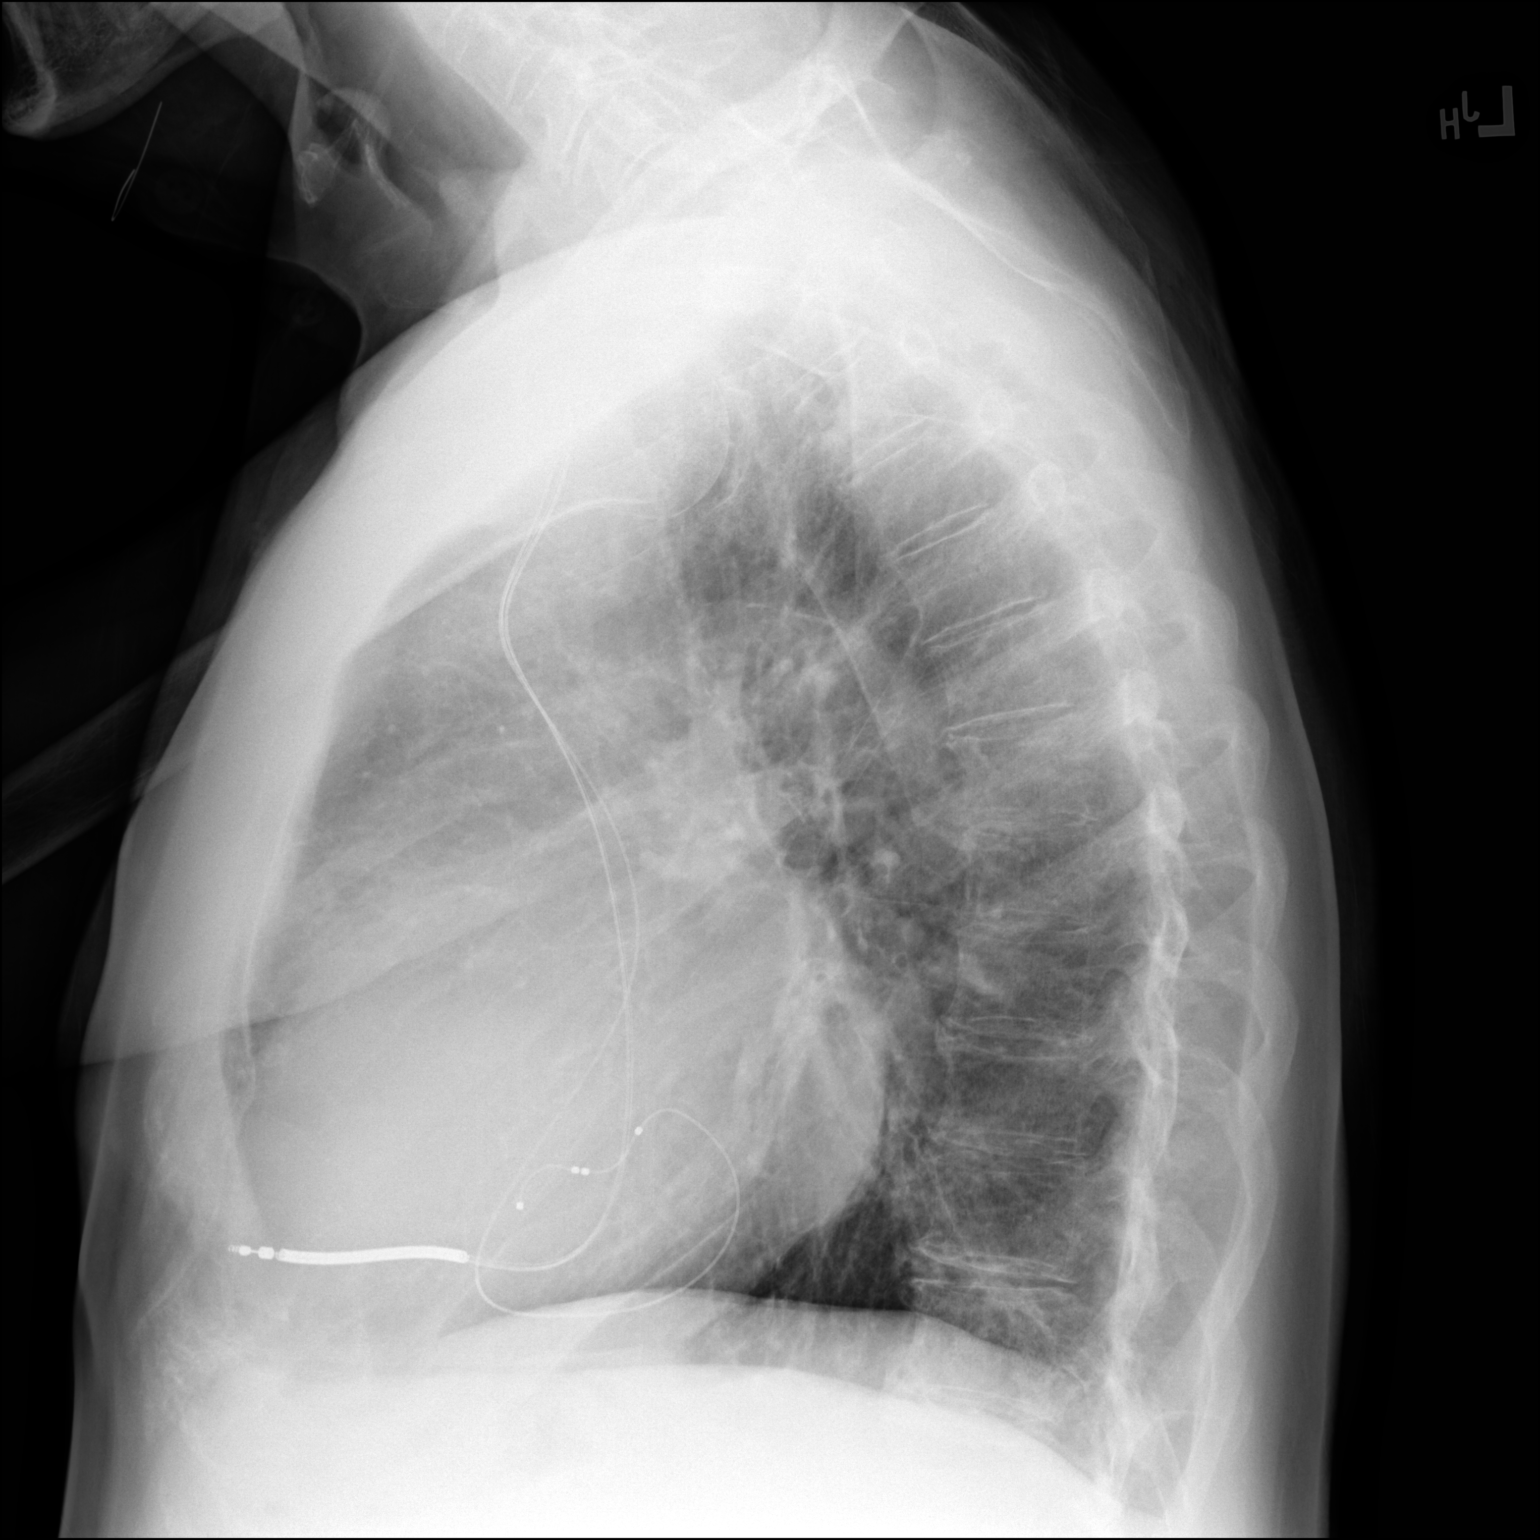

[2 of 2 positions shown; findings below may reference images not displayed]

FINDINGS: Background of mild chronic interstitial coarsening and bronchitic
changes. No focal consolidation, pleural effusion, pneumothorax. A
calcified granuloma noted in the right upper lobe. There is mild
cardiomegaly. Left pectoral AICD device with leads in similar
position as the study of 02/29/2020. There is atherosclerotic
calcification of the aortic arch. No acute osseous pathology.
Degenerative changes of the spine.
IMPRESSION: 1. No acute cardiopulmonary process.  No interval change.
2. AICD leads in similar position as the prior radiograph.

## 2021-02-27 ENCOUNTER — Ambulatory Visit (INDEPENDENT_AMBULATORY_CARE_PROVIDER_SITE_OTHER): Payer: Medicare HMO

## 2021-02-27 DIAGNOSIS — I5042 Chronic combined systolic (congestive) and diastolic (congestive) heart failure: Secondary | ICD-10-CM

## 2021-02-27 LAB — CUP PACEART REMOTE DEVICE CHECK
Battery Remaining Longevity: 82 mo
Battery Voltage: 3 V
Brady Statistic AP VP Percent: 0 %
Brady Statistic AP VS Percent: 0 %
Brady Statistic AS VP Percent: 0 %
Brady Statistic AS VS Percent: 0 %
Brady Statistic RA Percent Paced: 0 %
Brady Statistic RV Percent Paced: 97.22 %
Date Time Interrogation Session: 20220602001804
HighPow Impedance: 80 Ohm
Implantable Lead Implant Date: 20210603
Implantable Lead Implant Date: 20210603
Implantable Lead Location: 753858
Implantable Lead Location: 753860
Implantable Lead Model: 4598
Implantable Pulse Generator Implant Date: 20210603
Lead Channel Impedance Value: 189.073
Lead Channel Impedance Value: 189.073
Lead Channel Impedance Value: 213.18 Ohm
Lead Channel Impedance Value: 264 Ohm
Lead Channel Impedance Value: 264 Ohm
Lead Channel Impedance Value: 323 Ohm
Lead Channel Impedance Value: 323 Ohm
Lead Channel Impedance Value: 399 Ohm
Lead Channel Impedance Value: 4047 Ohm
Lead Channel Impedance Value: 456 Ohm
Lead Channel Impedance Value: 456 Ohm
Lead Channel Impedance Value: 627 Ohm
Lead Channel Impedance Value: 627 Ohm
Lead Channel Impedance Value: 665 Ohm
Lead Channel Impedance Value: 665 Ohm
Lead Channel Impedance Value: 855 Ohm
Lead Channel Impedance Value: 950 Ohm
Lead Channel Impedance Value: 969 Ohm
Lead Channel Pacing Threshold Amplitude: 0.375 V
Lead Channel Pacing Threshold Amplitude: 0.5 V
Lead Channel Pacing Threshold Pulse Width: 0.4 ms
Lead Channel Pacing Threshold Pulse Width: 0.8 ms
Lead Channel Sensing Intrinsic Amplitude: 6.75 mV
Lead Channel Sensing Intrinsic Amplitude: 6.75 mV
Lead Channel Setting Pacing Amplitude: 1 V
Lead Channel Setting Pacing Amplitude: 2.5 V
Lead Channel Setting Pacing Pulse Width: 0.4 ms
Lead Channel Setting Pacing Pulse Width: 0.8 ms
Lead Channel Setting Sensing Sensitivity: 0.3 mV

## 2021-02-28 ENCOUNTER — Ambulatory Visit: Payer: Medicare HMO | Admitting: Cardiology

## 2021-03-17 ENCOUNTER — Other Ambulatory Visit: Payer: Self-pay

## 2021-03-17 ENCOUNTER — Ambulatory Visit (INDEPENDENT_AMBULATORY_CARE_PROVIDER_SITE_OTHER): Payer: Medicare HMO | Admitting: *Deleted

## 2021-03-17 ENCOUNTER — Ambulatory Visit (INDEPENDENT_AMBULATORY_CARE_PROVIDER_SITE_OTHER): Payer: Medicare HMO

## 2021-03-17 DIAGNOSIS — Z5181 Encounter for therapeutic drug level monitoring: Secondary | ICD-10-CM

## 2021-03-17 DIAGNOSIS — I4821 Permanent atrial fibrillation: Secondary | ICD-10-CM

## 2021-03-17 DIAGNOSIS — I5042 Chronic combined systolic (congestive) and diastolic (congestive) heart failure: Secondary | ICD-10-CM | POA: Diagnosis not present

## 2021-03-17 DIAGNOSIS — Z95810 Presence of automatic (implantable) cardiac defibrillator: Secondary | ICD-10-CM

## 2021-03-17 LAB — POCT INR: INR: 5.1 — AB (ref 2.0–3.0)

## 2021-03-17 NOTE — Patient Instructions (Signed)
S/P amputation Lt great toe on 11/22/20 Hold warfarin tonight and tomorrow night then resume 1 tablet daily except 1 1/2 tablets on Sundays and Wednesdays Recheck in 2 weeks

## 2021-03-18 ENCOUNTER — Telehealth: Payer: Self-pay

## 2021-03-18 NOTE — Telephone Encounter (Signed)
Remote ICM transmission received.  Attempted call to wife regarding ICM remote transmission and left detailed message per DPR  Advised to return call for any fluid symptoms or questions. Next ICM remote transmission scheduled 04/21/2021.

## 2021-03-18 NOTE — Progress Notes (Signed)
EPIC Encounter for ICM Monitoring  Patient Name: [name] is a 80 y.o. male Date: 03/18/2021 Primary Care Physican: Ailene Ards, NP Primary Cardiologist: Domenic Polite Electrophysiologist: Santina Evans Pacing: 96.5%         02/04/2021 Weight: 185 lbs                                             Attempted call to wife and unable to reach.  Left detailed message per DPR regarding transmission. Transmission reviewed.    Optivol thoracic impedance normal.   Prescribed: Furosemide 20 mg take 1 tablet by mouth daily Spironolactone 25 mg take 1 tablet daily   Labs: 02/17/2021 Creatinine 1.59, BUN 36, Potassium 5.3, Sodium 137, GFR 41-47 12/11/2020 Creatinine 1.03, BUN 24, Potassium 4.4, Sodium 140, GFR 69-80 11/22/2020 Creatinine 1.15, BUN 23, Potassium 4.9, Sodium 134 11/19/2020 Creatinine 1.20, BUN 26, Potassium 4.7, Sodium 137, GFR 58-67 11/11/2020 Creatinine 1.63, BUN 45, Potassium 5.5, Sodium 133, GFR 40-46 A complete set of results can be found in Results Review.   Recommendations:  Left voice mail with ICM number and encouraged to call if experiencing any fluid symptoms.   Follow-up plan: ICM clinic phone appointment on 04/21/2021.   91 day device clinic remote transmission 05/30/2021.     EP/Cardiology Office Visits:  06/17/2021 with Dr Domenic Polite.     Copy of ICM check sent to Dr. Lovena Le.      3 month ICM trend: 03/17/2021.    1 Year ICM trend:       Rosalene Billings, RN 03/18/2021 12:10 PM

## 2021-03-19 ENCOUNTER — Encounter: Payer: Medicare HMO | Admitting: Podiatry

## 2021-03-21 ENCOUNTER — Other Ambulatory Visit: Payer: Self-pay | Admitting: Student

## 2021-03-21 ENCOUNTER — Other Ambulatory Visit (INDEPENDENT_AMBULATORY_CARE_PROVIDER_SITE_OTHER): Payer: Self-pay | Admitting: Internal Medicine

## 2021-03-21 NOTE — Telephone Encounter (Signed)
This is a [place] pt.  °

## 2021-03-21 NOTE — Progress Notes (Signed)
Remote ICD transmission.   

## 2021-03-24 ENCOUNTER — Other Ambulatory Visit: Payer: Self-pay

## 2021-03-24 ENCOUNTER — Ambulatory Visit (INDEPENDENT_AMBULATORY_CARE_PROVIDER_SITE_OTHER): Payer: Medicare HMO | Admitting: Podiatry

## 2021-03-24 DIAGNOSIS — E0843 Diabetes mellitus due to underlying condition with diabetic autonomic (poly)neuropathy: Secondary | ICD-10-CM

## 2021-03-24 DIAGNOSIS — L97522 Non-pressure chronic ulcer of other part of left foot with fat layer exposed: Secondary | ICD-10-CM | POA: Diagnosis not present

## 2021-03-24 DIAGNOSIS — M2042 Other hammer toe(s) (acquired), left foot: Secondary | ICD-10-CM

## 2021-03-24 NOTE — Progress Notes (Signed)
HPI: 80 y.o. male presenting today presenting today for follow-up routine evaluation of his bilateral feet.  He does have history of bilateral great toe amputations.  Patient states that he is doing well.  He has never had any pain associated to his feet.  He presents for further treatment evaluation  Past Medical History:  Diagnosis Date   AICD (automatic cardioverter/defibrillator) present    Cancer ([place])    CHF (congestive heart failure) ([place])    a. EF 45% in 2016 b. EF at 20-25% by repeat echo in 08/2019 with cath in 09/2019 showing nonobstructive CAD   Chronic atrial fibrillation (H[facility])    Chronic bronchitis    Diabetes mellitus, type II ([place])    no insulin   Gout    History of herpes zoster virus    History of radiation therapy 12/21/12- 02/15/13   prostate 78 gray in 40 fx   Hyperlipidemia    Hypertension    Peripheral vascular disease ([place])    Prostate cancer ([place]) 2014   EBRT + hormonal therapy   Vitamin D deficiency disease 06/07/2019     Objective: Physical Exam General: The patient is alert and oriented x3 in no acute distress.  Dermatology: Skin is cool, dry and supple bilateral lower extremities. Negative for open lesions or macerations.  There is an ulcer to the distal tip of the left second toe with overlying callus tissue formation.  The wound measures approximately 0.3 x 0.4 x 0.2 cm.  To the noted ulceration there is no eschar.  There is a minimal amount of slough fibrin and necrotic tissue noted.  Granulation tissue and wound base is red.  There is no exposed bone muscle tendon ligament or joint.  Wound is likely being caused from the hammertoe deformity to the toe  Vascular: Palpable pedal pulses bilaterally. No edema or erythema noted. Capillary refill within normal limits.  Neurological: Epicritic and protective threshold grossly intact bilaterally.   Musculoskeletal Exam: All pedal and ankle joints range of motion within normal limits bilateral. Muscle  strength 5/5 in all groups bilateral.  Reducible hammertoe contracture deformity noted to the symptomatic toe  Assessment: 1.  Reducible hammertoe left second toe 2.  Ulcer distal tip left second toe 3.  Diabetes mellitus with peripheral polyneuropathy[   Plan of Care:  1. Patient evaluated.  2.  Different treatment options were discussed with the patient. 3.  After evaluating the patient I do believe that a flexor tenotomy to the respective digit would help alleviate the patient's symptoms.  This would lift the toe to alleviate pressure from the digit.  The procedure was explained in detail and all patient questions were answered.  No guarantees were expressed or implied.  The patient consented for correction here in the office 4.  Prior to procedure the toe was blocked in a digital block fashion using 3 mL of lidocaine 2%. 5.  Flexor tenotomy was performed of the respective digit using a surgical #11 scalpel and a small percutaneous stab incision on the plantar sulcus of the toe.  The toe was immediately in a more rectus position.  Betadine soaked dry sterile dressing was applied. 6.  Post care instructions were provided 7.  Surgical shoe dispensed 8.  Medically necessary excisional debridement including subcutaneous tissue was also performed to the distal wound/ulcer with postdebridement measurement same as pre-.  Excisional debridement of all necrotic nonviable tissue down to healthier bleeding viable tissue was performed with postdebridement measurement same as pre-.  9.  Return to clinic in 1 week    Edrick Kins, DPM [facility]  Dr. Edrick Kins, DPM    [address], [place] [address]                Office [phone]  Fax [phone]

## 2021-03-27 ENCOUNTER — Ambulatory Visit (INDEPENDENT_AMBULATORY_CARE_PROVIDER_SITE_OTHER): Payer: Medicare HMO | Admitting: *Deleted

## 2021-03-27 DIAGNOSIS — Z5181 Encounter for therapeutic drug level monitoring: Secondary | ICD-10-CM | POA: Diagnosis not present

## 2021-03-27 DIAGNOSIS — I4821 Permanent atrial fibrillation: Secondary | ICD-10-CM | POA: Diagnosis not present

## 2021-03-27 LAB — POCT INR: INR: 2.9 (ref 2.0–3.0)

## 2021-03-27 NOTE — Patient Instructions (Signed)
S/P amputation Lt great toe on 11/22/20 Continue warfarin 1 tablet daily except 1 1/2 tablets on Sundays and Wednesdays Recheck in 3 weeks

## 2021-04-01 ENCOUNTER — Other Ambulatory Visit: Payer: Self-pay

## 2021-04-01 ENCOUNTER — Ambulatory Visit (INDEPENDENT_AMBULATORY_CARE_PROVIDER_SITE_OTHER): Payer: Medicare HMO | Admitting: Podiatry

## 2021-04-01 ENCOUNTER — Encounter: Payer: Self-pay | Admitting: Podiatry

## 2021-04-01 DIAGNOSIS — Z9889 Other specified postprocedural states: Secondary | ICD-10-CM

## 2021-04-01 NOTE — Progress Notes (Signed)
   Subjective:  Patient presents today status post in office flexor tenotomy of the left third toe.  Please note the prior notes of the second toe however this is the third toe of the left foot.  There is a history of amputations to the first and second toes of the left foot.. DOS: 03/24/2021 in office.  Patient states that he is feeling better.  He has no complaints and he believes the area is healed nicely.  Past Medical History:  Diagnosis Date   AICD (automatic cardioverter/defibrillator) present    Cancer ([place])    CHF (congestive heart failure) ([place])    a. EF 45% in 2016 b. EF at 20-25% by repeat echo in 08/2019 with cath in 09/2019 showing nonobstructive CAD   Chronic atrial fibrillation (HCC)    Chronic bronchitis    Diabetes mellitus, type II ([place])    no insulin   Gout    History of herpes zoster virus    History of radiation therapy 12/21/12- 02/15/13   prostate 78 gray in 40 fx   Hyperlipidemia    Hypertension    Peripheral vascular disease ([place])    Prostate cancer ([place]) 2014   EBRT + hormonal therapy   Vitamin D deficiency disease 06/07/2019      Objective/Physical Exam Vascular status intact.  Absence of light touch and sensation noted to the foot. the small percutaneous incision to the plantar aspect/sulcus of the toe has healed.. No sign of infectious process noted. No dehiscence. No active bleeding noted.  Negative for any significant edema  The toe is in a more rectus position and elevated with the distal tuft off of the ground.  This should create nice healing for the wound to the distal tip of the toe.  There is a very superficial chronic wound noted to the distal tip of the toe measuring approximately 0.5 x 0.5 x 0.1 cm.  Assessment: 1. s/p flexor tenotomy left third toe.  Performed in office 03/24/2021  Plan of Care:  1. Patient was evaluated. 2.  Overall the patient is doing very well.  He may resume regular shoes.  Discontinue the postsurgical shoe 3.  Patient  may resume full activity no restrictions 4.  Return to clinic in 3 months for routine follow-up and routine care   Edrick Kins, DPM [facility]  Dr. Edrick Kins, DPM    [address], [place] [address]                Office [phone]  Fax [phone]

## 2021-04-16 ENCOUNTER — Ambulatory Visit (INDEPENDENT_AMBULATORY_CARE_PROVIDER_SITE_OTHER): Payer: Medicare HMO | Admitting: *Deleted

## 2021-04-16 DIAGNOSIS — Z5181 Encounter for therapeutic drug level monitoring: Secondary | ICD-10-CM | POA: Diagnosis not present

## 2021-04-16 DIAGNOSIS — I4821 Permanent atrial fibrillation: Secondary | ICD-10-CM

## 2021-04-16 LAB — POCT INR: INR: 4.1 — AB (ref 2.0–3.0)

## 2021-04-16 NOTE — Patient Instructions (Signed)
S/P amputation Lt great toe on 11/22/20 Hold warfarin tonight then decrease dose to 1 tablet daily  Recheck in 3 weeks

## 2021-04-21 ENCOUNTER — Ambulatory Visit (INDEPENDENT_AMBULATORY_CARE_PROVIDER_SITE_OTHER): Payer: Medicare HMO

## 2021-04-21 DIAGNOSIS — Z95810 Presence of automatic (implantable) cardiac defibrillator: Secondary | ICD-10-CM | POA: Diagnosis not present

## 2021-04-21 DIAGNOSIS — I5042 Chronic combined systolic (congestive) and diastolic (congestive) heart failure: Secondary | ICD-10-CM

## 2021-04-23 ENCOUNTER — Encounter (INDEPENDENT_AMBULATORY_CARE_PROVIDER_SITE_OTHER): Payer: Self-pay | Admitting: Nurse Practitioner

## 2021-04-23 ENCOUNTER — Other Ambulatory Visit: Payer: Self-pay

## 2021-04-23 ENCOUNTER — Ambulatory Visit (INDEPENDENT_AMBULATORY_CARE_PROVIDER_SITE_OTHER): Payer: Medicare HMO | Admitting: Nurse Practitioner

## 2021-04-23 VITALS — BP 96/58 | HR 85 | Temp 97.3°F | Ht 69.5 in | Wt 177.8 lb

## 2021-04-23 DIAGNOSIS — R413 Other amnesia: Secondary | ICD-10-CM | POA: Diagnosis not present

## 2021-04-23 DIAGNOSIS — I10 Essential (primary) hypertension: Secondary | ICD-10-CM

## 2021-04-23 DIAGNOSIS — E119 Type 2 diabetes mellitus without complications: Secondary | ICD-10-CM | POA: Diagnosis not present

## 2021-04-23 DIAGNOSIS — E782 Mixed hyperlipidemia: Secondary | ICD-10-CM

## 2021-04-23 DIAGNOSIS — I5042 Chronic combined systolic (congestive) and diastolic (congestive) heart failure: Secondary | ICD-10-CM | POA: Diagnosis not present

## 2021-04-23 NOTE — Progress Notes (Signed)
EPIC Encounter for ICM Monitoring  Patient Name: [name] is a 80 y.o. male Date: 04/23/2021 Primary Care Physican: Ailene Ards, NP Primary Cardiologist: Domenic Polite Electrophysiologist: Santina Evans Pacing: 96.9%         02/04/2021 Weight: 185 lbs                                             Transmission reviewed.   Optivol thoracic impedance normal.   Prescribed: Furosemide 20 mg take 1 tablet by mouth daily Spironolactone 25 mg take 1 tablet daily   Labs: 02/17/2021 Creatinine 1.59, BUN 36, Potassium 5.3, Sodium 137, GFR 41-47 12/11/2020 Creatinine 1.03, BUN 24, Potassium 4.4, Sodium 140, GFR 69-80 11/22/2020 Creatinine 1.15, BUN 23, Potassium 4.9, Sodium 134 11/19/2020 Creatinine 1.20, BUN 26, Potassium 4.7, Sodium 137, GFR 58-67 11/11/2020 Creatinine 1.63, BUN 45, Potassium 5.5, Sodium 133, GFR 40-46 A complete set of results can be found in Results Review.   Recommendations:  No changes.   Follow-up plan: ICM clinic phone appointment on 05/26/2021.   91 day device clinic remote transmission 05/30/2021.     EP/Cardiology Office Visits:  06/17/2021 with Dr Domenic Polite.     Copy of ICM check sent to Dr. Lovena Le.     3 month ICM trend: 04/23/2021.    1 Year ICM trend:       Rosalene Billings, RN 04/23/2021 8:40 AM

## 2021-04-23 NOTE — Progress Notes (Signed)
Subjective:  Patient ID: [name], male    DOB: 26-Feb-1941  Age: 80 y.o. MRN: [id number]  CC:  Chief Complaint  Patient presents with   Follow-up    Doing well, no concerns   Other    Memory loss   Hyperlipidemia   Diabetes   Hypertension   Congestive Heart Failure      HPI  This patient arrives today for the above.  Memory loss: He continues to have short-term memory loss and intermittent confusion.  He has been referred to neurology and per his wife patient is scheduled to see them next month.  Hyperlipidemia: He is currently on drug holiday from lovastatin in case this is related to his memory issues.  Last LDL was collected about 2 months ago when he was off his statin and it was 194.  He denies any chest pain.  Diabetes: He continues on glipizide.  Last A1c was collected about 2 months ago and was 7.3.  He is positive for albuminuria.  He is on ARB.  As stated above he is on drug holiday from statin right now.  Hypertension/CHF: He continues on carvedilol, frusemide, spironolactone, Entresto, digoxin and is tolerating his medications well.  He denies any chest pain, shortness of breath, dizziness, lightheadedness, cardiac palpitations.  Past Medical History:  Diagnosis Date   AICD (automatic cardioverter/defibrillator) present    Cancer ([place])    CHF (congestive heart failure) ([place])    a. EF 45% in 2016 b. EF at 20-25% by repeat echo in 08/2019 with cath in 09/2019 showing nonobstructive CAD   Chronic atrial fibrillation (HCC)    Chronic bronchitis    Diabetes mellitus, type II ([place])    no insulin   Gout    History of herpes zoster virus    History of radiation therapy 12/21/12- 02/15/13   prostate 78 Dolores [place] in 40 fx   Hyperlipidemia    Hypertension    Peripheral vascular disease ([place])    Prostate cancer ([place]) 2014   EBRT + hormonal therapy   Vitamin D deficiency disease 06/07/2019      Family History  Problem Relation Age of Onset   Kidney failure  Mother    Heart attack Father     Social History   Social History Narrative   Not on file   Social History   Tobacco Use   Smoking status: Never   Smokeless tobacco: Never  Substance Use Topics   Alcohol use: No    Alcohol/week: 0.0 standard drinks     Current Meds  Medication Sig   acetaminophen (TYLENOL) 500 MG tablet Take 500 mg by mouth in the morning and at bedtime.   allopurinol (ZYLOPRIM) 300 MG tablet Take 1 tablet (300 mg total) by mouth daily.   Alpha-Lipoic Acid 200 MG CAPS Take 200 mg by mouth at bedtime.   Ascorbic Acid 500 MG CAPS Take 500 mg by mouth in the morning and at bedtime.   Blood Glucose Monitoring Suppl (TRUE METRIX METER) w/Device KIT USE AS DIRECTED   carvedilol (COREG) 25 MG tablet TAKE 1 AND 1/2 TABLETS TWICE DAILY (STOP DILTIAZEM AND LOPRESSOR)   cetaphil (CETAPHIL) lotion Apply 1 application topically as needed for dry skin (leg).   Cholecalciferol (VITAMIN D-3) 125 MCG (5000 UT) TABS Take 5,000 Units by mouth every evening.   digoxin (LANOXIN) 0.125 MG tablet Take 1 tablet (0.125 mg total) by mouth daily. (Patient taking differently: Take 0.125 mg by mouth every evening.)  ENTRESTO 24-26 MG Take 1 tablet by mouth twice daily   furosemide (LASIX) 20 MG tablet TAKE 1 TABLET EVERY DAY   glipiZIDE (GLUCOTROL) 5 MG tablet TAKE 1 TABLET EVERY DAY   oxyCODONE-acetaminophen (PERCOCET) 5-325 MG tablet Take 1 tablet by mouth every 6 (six) hours as needed for severe pain.   spironolactone (ALDACTONE) 25 MG tablet TAKE 1 TABLET EVERY DAY   TRUE METRIX BLOOD GLUCOSE TEST test strip TEST BLOOD SUGAR EVERY DAY   TRUEplus Lancets 33G MISC TEST BLOOD SUGAR EVERY DAY   vitamin E 400 UNIT capsule Take 400 Units by mouth every evening.   warfarin (COUMADIN) 5 MG tablet Take 1 tablet daily except 1 1/2 tablets on Sundays and Wednesdays or as directed   Zinc 50 MG TABS Take 25 mg by mouth at bedtime.    ROS:  Review of Systems  Constitutional:  Negative for  malaise/fatigue.  Respiratory:  Negative for shortness of breath.   Cardiovascular:  Negative for chest pain.  Neurological:  Negative for dizziness and headaches.    Objective:   Today's Vitals: BP (!) 96/58   Pulse 85   Temp (!) 97.3 F (36.3 C) (Temporal)   Ht 5' 9.5" (1.765 m)   Wt 177 lb 12.8 oz (80.6 kg)   SpO2 97%   BMI 25.88 kg/m  Vitals with BMI 04/23/2021 02/17/2021 02/12/2021  Height 5' 9.5" 5' 8.5" 6' 2.5"  Weight 177 lbs 13 oz 180 lbs 6 oz 184 lbs 10 oz  BMI 25.89 44.01 02.72  Systolic 96 536 644  Diastolic 58 68 62  Pulse 85 62 93     Physical Exam Vitals reviewed.  Constitutional:      Appearance: Normal appearance.  HENT:     Head: Normocephalic and atraumatic.  Cardiovascular:     Rate and Rhythm: Normal rate. Rhythm irregularly irregular.  Pulmonary:     Effort: Pulmonary effort is normal.     Breath sounds: Normal breath sounds.  Musculoskeletal:     Cervical back: Neck supple.  Skin:    General: Skin is warm and dry.  Neurological:     Mental Status: He is alert and oriented to person, place, and time.  Psychiatric:        Mood and Affect: Mood normal.        Behavior: Behavior normal.        Thought Content: Thought content normal.        Judgment: Judgment normal.         Assessment and Plan   1. Primary hypertension   2. Chronic combined systolic and diastolic heart failure ([place])   3. Type 2 diabetes mellitus without complication, without long-term current use of insulin ([place])   4. Mixed hyperlipidemia   5. Memory loss      Plan: 1.,  2.,  4.  Blood pressure is a little bit soft today.  He is completely asymptomatic.  He was encouraged to make sure he is drinking modest amounts during the day but to still monitor daily weights in order to prevent fluid overload.  I will have him come back in about 1 week for blood pressure check to make sure its not continuously low.  He will continue on his medications for his hypertension, CHF,  and will continue drug holiday from his statin.  Further recommendations we made based upon repeat blood pressure in 1 week. 3.  He will continue taking his medications for his diabetes as currently prescribed.  He will be due for A1c check at next office visit in 6 weeks. 5.  She will follow-up with neurology as currently scheduled.   Tests ordered No orders of the defined types were placed in this encounter.     No orders of the defined types were placed in this encounter.   Patient to follow-up in 1 week for blood pressure check, and in 6 weeks for office visit, or sooner as needed.  Ailene Ards, NP

## 2021-04-30 ENCOUNTER — Ambulatory Visit (INDEPENDENT_AMBULATORY_CARE_PROVIDER_SITE_OTHER): Payer: Medicare HMO

## 2021-04-30 ENCOUNTER — Encounter (INDEPENDENT_AMBULATORY_CARE_PROVIDER_SITE_OTHER): Payer: Self-pay

## 2021-04-30 ENCOUNTER — Other Ambulatory Visit: Payer: Self-pay

## 2021-04-30 VITALS — BP 126/67 | HR 82 | Temp 97.8°F | Resp 19 | Ht 69.0 in | Wt 173.2 lb

## 2021-04-30 DIAGNOSIS — I10 Essential (primary) hypertension: Secondary | ICD-10-CM | POA: Diagnosis not present

## 2021-04-30 NOTE — Progress Notes (Signed)
O2 Sats was 89% what got to room. Came up to 99 % after resting now. Pt is no longer winded as he was when arrived to appointment. Pt was given a bottle of water to rest & recover. Dure to the heat out side is humid and /dry at 1:22 pm today. Recommend that he keep cool bottle of water when they are out in the day traveling in car so he can cool down if need be.   Walking sats were performed in the office. Walking on RA oxygen saturations stabilized at 99%. No need to add supplemental O2.   BP check showed BP at 126/67. Patient told to remain on current blood pressure medications as prescribed.

## 2021-05-05 DIAGNOSIS — R531 Weakness: Secondary | ICD-10-CM | POA: Diagnosis not present

## 2021-05-05 DIAGNOSIS — R5381 Other malaise: Secondary | ICD-10-CM | POA: Diagnosis not present

## 2021-05-07 ENCOUNTER — Telehealth: Payer: Self-pay | Admitting: Neurology

## 2021-05-07 ENCOUNTER — Telehealth: Payer: Self-pay | Admitting: Family Medicine

## 2021-05-07 DIAGNOSIS — R404 Transient alteration of awareness: Secondary | ICD-10-CM | POA: Diagnosis not present

## 2021-05-09 NOTE — Telephone Encounter (Signed)
Death certificate was completed

## 2021-05-19 ENCOUNTER — Ambulatory Visit: Payer: Medicare HMO | Admitting: Neurology

## 2021-05-29 DIAGNOSIS — 419620001 Death: Secondary | SNOMED CT | POA: Diagnosis not present

## 2021-05-29 NOTE — Telephone Encounter (Signed)
Pt reported pt passed away

## 2021-05-29 NOTE — Telephone Encounter (Signed)
We are good, I imagine they will forward the death certificate's I will be fine with filling it out.  If per chance they forwarded to you through [place] dave if you need input feel free to call me or if you do not feel comfortable doing it feel free to forward it to me-thanks

## 2021-05-29 NOTE — Telephone Encounter (Signed)
Pt's wife reported pt passed away

## 2021-05-29 NOTE — Telephone Encounter (Signed)
EMS reached out to myself Our practice was on call for phone emergencies via EMS Due to Dr. Lanice Shirts office situation Patient suffered death at home Was felt to be DOA with pronouncement at 1:09 PM Remer Macho will be through [facility]  I did speak with the wife and expressed sympathies and condolences Patient has had some chest congestion and shortness of breath over the past couple days and got worse today.  Wife stated that she tried to get some help and was unable to get help in time. More than likely passed away from CHF/heart failure I did tell EMS that we would fill out the death certificate if forwarded to [place]  A copy of this message will be forwarded to the nurse practitioner Jeralyn Ruths

## 2021-05-29 DEATH — deceased

## 2021-06-04 ENCOUNTER — Ambulatory Visit (INDEPENDENT_AMBULATORY_CARE_PROVIDER_SITE_OTHER): Payer: Medicare HMO | Admitting: Nurse Practitioner

## 2021-06-17 ENCOUNTER — Ambulatory Visit: Payer: Medicare HMO | Admitting: Cardiology

## 2021-07-02 ENCOUNTER — Encounter: Payer: Medicare HMO | Admitting: Podiatry
